# Patient Record
Sex: Female | Born: 1965
Health system: Southern US, Community
[De-identification: ages and names within clinical notes are randomized; demographics above are authoritative.]

## PROBLEM LIST (undated history)

## (undated) DIAGNOSIS — M5126 Other intervertebral disc displacement, lumbar region: Secondary | ICD-10-CM

## (undated) DIAGNOSIS — Z9109 Other allergy status, other than to drugs and biological substances: Secondary | ICD-10-CM

## (undated) DIAGNOSIS — T783XXA Angioneurotic edema, initial encounter: Secondary | ICD-10-CM

## (undated) DIAGNOSIS — I82409 Acute embolism and thrombosis of unspecified deep veins of unspecified lower extremity: Secondary | ICD-10-CM

## (undated) DIAGNOSIS — M1990 Unspecified osteoarthritis, unspecified site: Secondary | ICD-10-CM

## (undated) DIAGNOSIS — K219 Gastro-esophageal reflux disease without esophagitis: Secondary | ICD-10-CM

## (undated) DIAGNOSIS — F32A Depression, unspecified: Secondary | ICD-10-CM

## (undated) DIAGNOSIS — R197 Diarrhea, unspecified: Secondary | ICD-10-CM

## (undated) DIAGNOSIS — D510 Vitamin B12 deficiency anemia due to intrinsic factor deficiency: Secondary | ICD-10-CM

## (undated) DIAGNOSIS — N189 Chronic kidney disease, unspecified: Secondary | ICD-10-CM

## (undated) DIAGNOSIS — I739 Peripheral vascular disease, unspecified: Secondary | ICD-10-CM

## (undated) DIAGNOSIS — R011 Cardiac murmur, unspecified: Secondary | ICD-10-CM

## (undated) DIAGNOSIS — J302 Other seasonal allergic rhinitis: Secondary | ICD-10-CM

## (undated) DIAGNOSIS — F1410 Cocaine abuse, uncomplicated: Secondary | ICD-10-CM

## (undated) DIAGNOSIS — F419 Anxiety disorder, unspecified: Secondary | ICD-10-CM

## (undated) DIAGNOSIS — J189 Pneumonia, unspecified organism: Secondary | ICD-10-CM

## (undated) DIAGNOSIS — W19XXXA Unspecified fall, initial encounter: Secondary | ICD-10-CM

## (undated) DIAGNOSIS — F0781 Postconcussional syndrome: Secondary | ICD-10-CM

## (undated) DIAGNOSIS — G8929 Other chronic pain: Secondary | ICD-10-CM

## (undated) DIAGNOSIS — F329 Major depressive disorder, single episode, unspecified: Secondary | ICD-10-CM

## (undated) DIAGNOSIS — M549 Dorsalgia, unspecified: Secondary | ICD-10-CM

## (undated) DIAGNOSIS — K449 Diaphragmatic hernia without obstruction or gangrene: Secondary | ICD-10-CM

## (undated) DIAGNOSIS — G2582 Stiff-man syndrome: Secondary | ICD-10-CM

## (undated) HISTORY — DX: Vitamin B12 deficiency anemia due to intrinsic factor deficiency: D51.0

## (undated) HISTORY — DX: Cocaine abuse, uncomplicated: F14.10

## (undated) HISTORY — DX: Diaphragmatic hernia without obstruction or gangrene: K44.9

## (undated) HISTORY — DX: Postconcussional syndrome: F07.81

## (undated) HISTORY — DX: Unspecified fall, initial encounter: W19.XXXA

## (undated) HISTORY — DX: Stiff-man syndrome: G25.82

## (undated) HISTORY — PX: BUNIONECTOMY: SHX129

---
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
## 1998-08-08 ENCOUNTER — Emergency Department (HOSPITAL_COMMUNITY): Admission: EM | Admit: 1998-08-08 | Discharge: 1998-08-08 | Payer: Self-pay | Admitting: Emergency Medicine

## 1998-08-21 ENCOUNTER — Emergency Department (HOSPITAL_COMMUNITY): Admission: EM | Admit: 1998-08-21 | Discharge: 1998-08-21 | Payer: Self-pay | Admitting: Emergency Medicine

## 1998-11-21 ENCOUNTER — Emergency Department (HOSPITAL_COMMUNITY): Admission: EM | Admit: 1998-11-21 | Discharge: 1998-11-21 | Payer: Self-pay | Admitting: Emergency Medicine

## 1999-06-07 ENCOUNTER — Other Ambulatory Visit: Admission: RE | Admit: 1999-06-07 | Discharge: 1999-06-07 | Payer: Self-pay | Admitting: Obstetrics

## 1999-07-09 ENCOUNTER — Emergency Department (HOSPITAL_COMMUNITY): Admission: EM | Admit: 1999-07-09 | Discharge: 1999-07-09 | Payer: Self-pay | Admitting: Emergency Medicine

## 1999-08-28 ENCOUNTER — Emergency Department (HOSPITAL_COMMUNITY): Admission: EM | Admit: 1999-08-28 | Discharge: 1999-08-28 | Payer: Self-pay | Admitting: Emergency Medicine

## 1999-09-06 ENCOUNTER — Other Ambulatory Visit: Admission: RE | Admit: 1999-09-06 | Discharge: 1999-09-06 | Payer: Self-pay | Admitting: Obstetrics

## 1999-11-02 ENCOUNTER — Encounter: Admission: RE | Admit: 1999-11-02 | Discharge: 1999-11-02 | Payer: Self-pay | Admitting: Internal Medicine

## 1999-11-02 ENCOUNTER — Encounter: Payer: Self-pay | Admitting: Internal Medicine

## 2000-06-19 ENCOUNTER — Other Ambulatory Visit: Admission: RE | Admit: 2000-06-19 | Discharge: 2000-06-19 | Payer: Self-pay | Admitting: Obstetrics

## 2000-08-24 ENCOUNTER — Emergency Department (HOSPITAL_COMMUNITY): Admission: EM | Admit: 2000-08-24 | Discharge: 2000-08-24 | Payer: Self-pay | Admitting: Emergency Medicine

## 2000-09-03 ENCOUNTER — Other Ambulatory Visit: Admission: RE | Admit: 2000-09-03 | Discharge: 2000-09-03 | Payer: Self-pay | Admitting: Obstetrics

## 2001-03-13 ENCOUNTER — Ambulatory Visit (HOSPITAL_COMMUNITY): Admission: RE | Admit: 2001-03-13 | Discharge: 2001-03-13 | Payer: Self-pay | Admitting: Neurology

## 2001-04-12 ENCOUNTER — Encounter: Payer: Self-pay | Admitting: Neurology

## 2001-04-12 ENCOUNTER — Encounter: Admission: RE | Admit: 2001-04-12 | Discharge: 2001-04-12 | Payer: Self-pay | Admitting: Neurology

## 2003-05-23 ENCOUNTER — Emergency Department (HOSPITAL_COMMUNITY): Admission: EM | Admit: 2003-05-23 | Discharge: 2003-05-24 | Payer: Self-pay

## 2003-06-22 ENCOUNTER — Ambulatory Visit (HOSPITAL_COMMUNITY): Admission: RE | Admit: 2003-06-22 | Discharge: 2003-06-22 | Payer: Self-pay | Admitting: Gastroenterology

## 2003-12-10 ENCOUNTER — Emergency Department (HOSPITAL_COMMUNITY): Admission: EM | Admit: 2003-12-10 | Discharge: 2003-12-10 | Payer: Self-pay | Admitting: Emergency Medicine

## 2004-01-03 ENCOUNTER — Emergency Department (HOSPITAL_COMMUNITY): Admission: EM | Admit: 2004-01-03 | Discharge: 2004-01-03 | Payer: Self-pay | Admitting: Emergency Medicine

## 2004-10-31 ENCOUNTER — Ambulatory Visit: Payer: Self-pay | Admitting: Hematology & Oncology

## 2004-12-27 ENCOUNTER — Ambulatory Visit: Payer: Self-pay | Admitting: Hematology & Oncology

## 2005-02-21 ENCOUNTER — Ambulatory Visit: Payer: Self-pay | Admitting: Hematology & Oncology

## 2005-04-25 ENCOUNTER — Ambulatory Visit: Payer: Self-pay | Admitting: Hematology & Oncology

## 2005-06-26 ENCOUNTER — Ambulatory Visit: Payer: Self-pay | Admitting: Hematology & Oncology

## 2005-08-18 ENCOUNTER — Ambulatory Visit: Payer: Self-pay | Admitting: Hematology & Oncology

## 2005-09-18 ENCOUNTER — Encounter: Admission: RE | Admit: 2005-09-18 | Discharge: 2005-09-18 | Payer: Self-pay | Admitting: Obstetrics

## 2005-10-13 ENCOUNTER — Ambulatory Visit: Payer: Self-pay | Admitting: Hematology & Oncology

## 2005-12-15 ENCOUNTER — Ambulatory Visit: Payer: Self-pay | Admitting: Hematology & Oncology

## 2006-02-09 ENCOUNTER — Ambulatory Visit: Payer: Self-pay | Admitting: Hematology & Oncology

## 2006-03-22 ENCOUNTER — Emergency Department (HOSPITAL_COMMUNITY): Admission: EM | Admit: 2006-03-22 | Discharge: 2006-03-22 | Payer: Self-pay | Admitting: Emergency Medicine

## 2006-04-06 ENCOUNTER — Ambulatory Visit: Payer: Self-pay | Admitting: Hematology & Oncology

## 2006-04-09 LAB — CBC WITH DIFFERENTIAL/PLATELET
BASO%: 0.2 % (ref 0.0–2.0)
Basophils Absolute: 0 10*3/uL (ref 0.0–0.1)
Eosinophils Absolute: 0 10*3/uL (ref 0.0–0.5)
HCT: 40.5 % (ref 34.8–46.6)
HGB: 13.3 g/dL (ref 11.6–15.9)
MCHC: 32.8 g/dL (ref 32.0–36.0)
MCV: 84.2 fL (ref 81.0–101.0)
MONO#: 0.4 10*3/uL (ref 0.1–0.9)
NEUT#: 6 10*3/uL (ref 1.5–6.5)
NEUT%: 69.1 % (ref 39.6–76.8)
WBC: 8.6 10*3/uL (ref 3.9–10.0)
lymph#: 2.2 10*3/uL (ref 0.9–3.3)

## 2006-04-13 ENCOUNTER — Encounter: Admission: RE | Admit: 2006-04-13 | Discharge: 2006-04-13 | Payer: Self-pay | Admitting: Neurology

## 2006-05-07 LAB — CBC WITH DIFFERENTIAL/PLATELET
BASO%: 0.1 % (ref 0.0–2.0)
HCT: 38 % (ref 34.8–46.6)
MCHC: 33.3 g/dL (ref 32.0–36.0)
MCV: 84.1 fL (ref 81.0–101.0)
MONO#: 0.4 10*3/uL (ref 0.1–0.9)
RBC: 4.52 10*6/uL (ref 3.70–5.32)
RDW: 13.5 % (ref 11.3–14.5)
WBC: 6.7 10*3/uL (ref 3.9–10.0)
lymph#: 2.2 10*3/uL (ref 0.9–3.3)

## 2006-05-31 ENCOUNTER — Ambulatory Visit: Payer: Self-pay | Admitting: Hematology & Oncology

## 2006-06-04 LAB — CBC WITH DIFFERENTIAL/PLATELET
BASO%: 0.1 % (ref 0.0–2.0)
Basophils Absolute: 0 10*3/uL (ref 0.0–0.1)
HCT: 38.6 % (ref 34.8–46.6)
LYMPH%: 32.1 % (ref 14.0–48.0)
MCHC: 32.8 g/dL (ref 32.0–36.0)
MONO#: 0.4 10*3/uL (ref 0.1–0.9)
NEUT#: 4 10*3/uL (ref 1.5–6.5)
NEUT%: 62.2 % (ref 39.6–76.8)
Platelets: 315 10*3/uL (ref 145–400)
RBC: 4.59 10*6/uL (ref 3.70–5.32)
WBC: 6.5 10*3/uL (ref 3.9–10.0)

## 2006-07-16 ENCOUNTER — Ambulatory Visit: Payer: Self-pay | Admitting: Hematology & Oncology

## 2006-07-16 LAB — CBC WITH DIFFERENTIAL/PLATELET
Eosinophils Absolute: 0 10*3/uL (ref 0.0–0.5)
HCT: 38.2 % (ref 34.8–46.6)
LYMPH%: 26.2 % (ref 14.0–48.0)
MONO#: 0.4 10*3/uL (ref 0.1–0.9)
NEUT#: 5.5 10*3/uL (ref 1.5–6.5)
Platelets: 292 10*3/uL (ref 145–400)
RBC: 4.55 10*6/uL (ref 3.70–5.32)
WBC: 8.1 10*3/uL (ref 3.9–10.0)

## 2006-07-16 LAB — VITAMIN B12: Vitamin B-12: 573 pg/mL (ref 211–911)

## 2006-08-16 ENCOUNTER — Emergency Department (HOSPITAL_COMMUNITY): Admission: EM | Admit: 2006-08-16 | Discharge: 2006-08-17 | Payer: Self-pay | Admitting: Emergency Medicine

## 2006-08-20 LAB — CBC & DIFF AND RETIC
Basophils Absolute: 0 10*3/uL (ref 0.0–0.1)
Eosinophils Absolute: 0 10*3/uL (ref 0.0–0.5)
HCT: 39.3 % (ref 34.8–46.6)
HGB: 13.1 g/dL (ref 11.6–15.9)
IRF: 0.22 (ref 0.130–0.330)
LYMPH%: 22.8 % (ref 14.0–48.0)
MCV: 84.2 fL (ref 81.0–101.0)
MONO#: 0.4 10*3/uL (ref 0.1–0.9)
MONO%: 4.8 % (ref 0.0–13.0)
NEUT#: 6.1 10*3/uL (ref 1.5–6.5)
NEUT%: 72.3 % (ref 39.6–76.8)
Platelets: 342 10*3/uL (ref 145–400)
RBC: 4.67 10*6/uL (ref 3.70–5.32)
RETIC #: 87.8 10*3/uL (ref 19.7–115.1)
Retic %: 1.9 % (ref 0.4–2.3)
WBC: 8.5 10*3/uL (ref 3.9–10.0)
lymph#: 1.9 10*3/uL (ref 0.9–3.3)

## 2006-08-20 LAB — CHCC SMEAR

## 2006-09-13 ENCOUNTER — Ambulatory Visit: Payer: Self-pay | Admitting: Hematology & Oncology

## 2006-09-20 ENCOUNTER — Encounter: Admission: RE | Admit: 2006-09-20 | Discharge: 2006-09-20 | Payer: Self-pay | Admitting: Obstetrics

## 2006-10-31 ENCOUNTER — Emergency Department (HOSPITAL_COMMUNITY): Admission: EM | Admit: 2006-10-31 | Discharge: 2006-10-31 | Payer: Self-pay | Admitting: Emergency Medicine

## 2006-11-01 ENCOUNTER — Encounter: Admission: RE | Admit: 2006-11-01 | Discharge: 2006-11-01 | Payer: Self-pay | Admitting: Orthopedic Surgery

## 2006-11-14 ENCOUNTER — Ambulatory Visit: Payer: Self-pay | Admitting: Hematology & Oncology

## 2006-11-22 ENCOUNTER — Encounter: Admission: RE | Admit: 2006-11-22 | Discharge: 2006-11-22 | Payer: Self-pay | Admitting: Orthopedic Surgery

## 2006-12-14 ENCOUNTER — Encounter (INDEPENDENT_AMBULATORY_CARE_PROVIDER_SITE_OTHER): Payer: Self-pay | Admitting: *Deleted

## 2006-12-14 ENCOUNTER — Ambulatory Visit (HOSPITAL_COMMUNITY): Admission: RE | Admit: 2006-12-14 | Discharge: 2006-12-14 | Payer: Self-pay | Admitting: Gastroenterology

## 2007-01-09 ENCOUNTER — Ambulatory Visit: Payer: Self-pay | Admitting: Hematology & Oncology

## 2007-03-06 ENCOUNTER — Ambulatory Visit: Payer: Self-pay | Admitting: Hematology & Oncology

## 2007-03-11 LAB — CBC WITH DIFFERENTIAL/PLATELET
BASO%: 0.3 % (ref 0.0–2.0)
Basophils Absolute: 0 10*3/uL (ref 0.0–0.1)
EOS%: 0 % (ref 0.0–7.0)
Eosinophils Absolute: 0 10*3/uL (ref 0.0–0.5)
HCT: 38.2 % (ref 34.8–46.6)
HGB: 12.7 g/dL (ref 11.6–15.9)
LYMPH%: 32.7 % (ref 14.0–48.0)
MCH: 28 pg (ref 26.0–34.0)
MCHC: 33.2 g/dL (ref 32.0–36.0)
MCV: 84.3 fL (ref 81.0–101.0)
MONO%: 5.5 % (ref 0.0–13.0)
NEUT#: 3.6 10*3/uL (ref 1.5–6.5)
NEUT%: 61.5 % (ref 39.6–76.8)
Platelets: 347 10*3/uL (ref 145–400)
lymph#: 1.9 10*3/uL (ref 0.9–3.3)

## 2007-05-02 ENCOUNTER — Ambulatory Visit: Payer: Self-pay | Admitting: Hematology & Oncology

## 2007-05-12 ENCOUNTER — Emergency Department (HOSPITAL_COMMUNITY): Admission: EM | Admit: 2007-05-12 | Discharge: 2007-05-12 | Payer: Self-pay | Admitting: Emergency Medicine

## 2007-07-11 ENCOUNTER — Ambulatory Visit: Payer: Self-pay | Admitting: Hematology & Oncology

## 2007-08-02 ENCOUNTER — Encounter: Admission: RE | Admit: 2007-08-02 | Discharge: 2007-08-02 | Payer: Self-pay | Admitting: Orthopedic Surgery

## 2007-08-20 ENCOUNTER — Encounter: Admission: RE | Admit: 2007-08-20 | Discharge: 2007-08-20 | Payer: Self-pay | Admitting: Orthopedic Surgery

## 2007-08-30 ENCOUNTER — Emergency Department (HOSPITAL_COMMUNITY): Admission: EM | Admit: 2007-08-30 | Discharge: 2007-08-30 | Payer: Self-pay | Admitting: Emergency Medicine

## 2007-09-05 ENCOUNTER — Ambulatory Visit: Payer: Self-pay | Admitting: Hematology & Oncology

## 2007-09-13 ENCOUNTER — Encounter: Admission: RE | Admit: 2007-09-13 | Discharge: 2007-09-13 | Payer: Self-pay | Admitting: Orthopedic Surgery

## 2007-10-31 ENCOUNTER — Ambulatory Visit: Payer: Self-pay | Admitting: Hematology & Oncology

## 2007-12-25 ENCOUNTER — Ambulatory Visit: Payer: Self-pay | Admitting: Hematology & Oncology

## 2008-02-20 ENCOUNTER — Ambulatory Visit: Payer: Self-pay | Admitting: Hematology & Oncology

## 2008-04-30 ENCOUNTER — Ambulatory Visit: Payer: Self-pay | Admitting: Hematology & Oncology

## 2008-05-04 LAB — CBC WITH DIFFERENTIAL/PLATELET
Basophils Absolute: 0 10*3/uL (ref 0.0–0.1)
EOS%: 0 % (ref 0.0–7.0)
Eosinophils Absolute: 0 10*3/uL (ref 0.0–0.5)
HCT: 39.8 % (ref 34.8–46.6)
HGB: 13.3 g/dL (ref 11.6–15.9)
NEUT#: 5.7 10*3/uL (ref 1.5–6.5)
RDW: 13.4 % (ref 11.3–14.5)
WBC: 8.5 10*3/uL (ref 3.9–10.0)
lymph#: 2.4 10*3/uL (ref 0.9–3.3)

## 2008-07-01 ENCOUNTER — Ambulatory Visit: Payer: Self-pay | Admitting: Hematology & Oncology

## 2008-08-11 ENCOUNTER — Encounter: Payer: Self-pay | Admitting: Internal Medicine

## 2008-08-27 ENCOUNTER — Ambulatory Visit: Payer: Self-pay | Admitting: Hematology & Oncology

## 2008-10-22 ENCOUNTER — Ambulatory Visit: Payer: Self-pay | Admitting: Hematology & Oncology

## 2008-12-15 ENCOUNTER — Ambulatory Visit: Payer: Self-pay | Admitting: Hematology & Oncology

## 2009-02-11 ENCOUNTER — Ambulatory Visit: Payer: Self-pay | Admitting: Hematology & Oncology

## 2009-04-09 ENCOUNTER — Ambulatory Visit: Payer: Self-pay | Admitting: Hematology & Oncology

## 2009-04-12 ENCOUNTER — Ambulatory Visit (HOSPITAL_BASED_OUTPATIENT_CLINIC_OR_DEPARTMENT_OTHER): Admission: RE | Admit: 2009-04-12 | Discharge: 2009-04-12 | Payer: Self-pay | Admitting: Hematology & Oncology

## 2009-04-12 ENCOUNTER — Ambulatory Visit: Payer: Self-pay | Admitting: Diagnostic Radiology

## 2009-04-12 LAB — CBC WITH DIFFERENTIAL (CANCER CENTER ONLY)
BASO#: 0 10*3/uL (ref 0.0–0.2)
BASO%: 0.4 % (ref 0.0–2.0)
EOS%: 0.6 % (ref 0.0–7.0)
Eosinophils Absolute: 0.1 10*3/uL (ref 0.0–0.5)
HCT: 38.7 % (ref 34.8–46.6)
HGB: 12.8 g/dL (ref 11.6–15.9)
LYMPH#: 2.3 10*3/uL (ref 0.9–3.3)
LYMPH%: 25.3 % (ref 14.0–48.0)
MCH: 27.5 pg (ref 26.0–34.0)
MCHC: 33.2 g/dL (ref 32.0–36.0)
MCV: 83 fL (ref 81–101)
MONO#: 0.5 10*3/uL (ref 0.1–0.9)
MONO%: 5.9 % (ref 0.0–13.0)
NEUT#: 6.1 10*3/uL (ref 1.5–6.5)
NEUT%: 67.8 % (ref 39.6–80.0)
Platelets: 313 10*3/uL (ref 145–400)
RBC: 4.66 10*6/uL (ref 3.70–5.32)
RDW: 12.5 % (ref 10.5–14.6)
WBC: 9 10*3/uL (ref 3.9–10.0)

## 2009-04-12 LAB — RETICULOCYTES (CHCC)
ABS Retic: 81.9 10*3/uL (ref 19.0–186.0)
RBC.: 4.55 MIL/uL (ref 3.87–5.11)
Retic Ct Pct: 1.8 % (ref 0.4–3.1)

## 2009-05-10 ENCOUNTER — Ambulatory Visit: Payer: Self-pay | Admitting: Hematology

## 2009-05-14 ENCOUNTER — Emergency Department (HOSPITAL_COMMUNITY): Admission: EM | Admit: 2009-05-14 | Discharge: 2009-05-15 | Payer: Self-pay | Admitting: Emergency Medicine

## 2009-05-17 ENCOUNTER — Encounter: Admission: RE | Admit: 2009-05-17 | Discharge: 2009-05-17 | Payer: Self-pay | Admitting: Allergy

## 2009-05-17 ENCOUNTER — Encounter: Payer: Self-pay | Admitting: Internal Medicine

## 2009-07-01 ENCOUNTER — Ambulatory Visit: Payer: Self-pay | Admitting: Hematology

## 2009-07-22 ENCOUNTER — Ambulatory Visit: Payer: Self-pay | Admitting: *Deleted

## 2009-07-22 ENCOUNTER — Emergency Department (HOSPITAL_COMMUNITY): Admission: EM | Admit: 2009-07-22 | Discharge: 2009-07-22 | Payer: Self-pay | Admitting: Emergency Medicine

## 2009-07-23 ENCOUNTER — Ambulatory Visit (HOSPITAL_COMMUNITY): Admission: RE | Admit: 2009-07-23 | Discharge: 2009-07-23 | Payer: Self-pay | Admitting: Emergency Medicine

## 2009-07-23 ENCOUNTER — Encounter (INDEPENDENT_AMBULATORY_CARE_PROVIDER_SITE_OTHER): Payer: Self-pay | Admitting: Emergency Medicine

## 2009-08-26 ENCOUNTER — Ambulatory Visit: Payer: Self-pay | Admitting: Hematology

## 2009-09-22 ENCOUNTER — Emergency Department (HOSPITAL_COMMUNITY): Admission: EM | Admit: 2009-09-22 | Discharge: 2009-09-22 | Payer: Self-pay | Admitting: Emergency Medicine

## 2009-09-23 ENCOUNTER — Ambulatory Visit: Payer: Self-pay | Admitting: Hematology

## 2009-10-13 ENCOUNTER — Emergency Department (HOSPITAL_COMMUNITY): Admission: EM | Admit: 2009-10-13 | Discharge: 2009-10-13 | Payer: Self-pay | Admitting: Emergency Medicine

## 2009-10-25 ENCOUNTER — Ambulatory Visit: Payer: Self-pay | Admitting: Hematology

## 2009-12-15 ENCOUNTER — Ambulatory Visit: Payer: Self-pay | Admitting: Hematology

## 2010-01-07 ENCOUNTER — Emergency Department (HOSPITAL_COMMUNITY): Admission: EM | Admit: 2010-01-07 | Discharge: 2010-01-07 | Payer: Self-pay | Admitting: Emergency Medicine

## 2010-01-09 ENCOUNTER — Emergency Department (HOSPITAL_COMMUNITY): Admission: EM | Admit: 2010-01-09 | Discharge: 2010-01-09 | Payer: Self-pay | Admitting: Emergency Medicine

## 2010-01-13 ENCOUNTER — Ambulatory Visit: Payer: Self-pay | Admitting: Hematology

## 2010-02-16 ENCOUNTER — Ambulatory Visit: Payer: Self-pay | Admitting: Hematology & Oncology

## 2010-02-17 LAB — CBC WITH DIFFERENTIAL (CANCER CENTER ONLY)
BASO%: 0.3 % (ref 0.0–2.0)
Eosinophils Absolute: 0 10*3/uL (ref 0.0–0.5)
HGB: 13 g/dL (ref 11.6–15.9)
LYMPH#: 2 10*3/uL (ref 0.9–3.3)
LYMPH%: 41.3 % (ref 14.0–48.0)
MCHC: 32.7 g/dL (ref 32.0–36.0)
MCV: 85 fL (ref 81–101)
MONO#: 0.3 10*3/uL (ref 0.1–0.9)
MONO%: 6.4 % (ref 0.0–13.0)
NEUT#: 2.5 10*3/uL (ref 1.5–6.5)
NEUT%: 51.5 % (ref 39.6–80.0)
Platelets: 381 10*3/uL (ref 145–400)
WBC: 4.9 10*3/uL (ref 3.9–10.0)

## 2010-02-17 LAB — CHCC SATELLITE - SMEAR

## 2010-02-18 LAB — COMPREHENSIVE METABOLIC PANEL
ALT: 21 U/L (ref 0–35)
AST: 27 U/L (ref 0–37)
Albumin: 4.7 g/dL (ref 3.5–5.2)
BUN: 13 mg/dL (ref 6–23)
CO2: 25 mEq/L (ref 19–32)
Calcium: 9.9 mg/dL (ref 8.4–10.5)
Chloride: 102 mEq/L (ref 96–112)
Total Bilirubin: 0.3 mg/dL (ref 0.3–1.2)

## 2010-02-18 LAB — RETICULOCYTES (CHCC)
ABS Retic: 41 10*3/uL (ref 19.0–186.0)
RBC.: 4.56 MIL/uL (ref 3.87–5.11)
Retic Ct Pct: 0.9 % (ref 0.4–3.1)

## 2010-02-18 LAB — VITAMIN D 25 HYDROXY (VIT D DEFICIENCY, FRACTURES): Vit D, 25-Hydroxy: 47 ng/mL (ref 30–89)

## 2010-02-22 ENCOUNTER — Ambulatory Visit (HOSPITAL_COMMUNITY): Admission: RE | Admit: 2010-02-22 | Discharge: 2010-02-22 | Payer: Self-pay | Admitting: Hematology & Oncology

## 2010-02-22 ENCOUNTER — Ambulatory Visit: Payer: Self-pay | Admitting: Surgery

## 2010-02-23 ENCOUNTER — Encounter: Payer: Self-pay | Admitting: Internal Medicine

## 2010-03-01 ENCOUNTER — Emergency Department (HOSPITAL_COMMUNITY): Admission: EM | Admit: 2010-03-01 | Discharge: 2010-03-01 | Payer: Self-pay | Admitting: Emergency Medicine

## 2010-03-01 ENCOUNTER — Emergency Department (HOSPITAL_COMMUNITY): Admission: EM | Admit: 2010-03-01 | Discharge: 2010-03-02 | Payer: Self-pay | Admitting: Emergency Medicine

## 2010-03-17 LAB — CBC WITH DIFFERENTIAL (CANCER CENTER ONLY)
BASO#: 0 10*3/uL (ref 0.0–0.2)
Eosinophils Absolute: 0.1 10*3/uL (ref 0.0–0.5)
HGB: 12.3 g/dL (ref 11.6–15.9)
LYMPH#: 2.1 10*3/uL (ref 0.9–3.3)
LYMPH%: 21.6 % (ref 14.0–48.0)
MCHC: 32.6 g/dL (ref 32.0–36.0)
MONO%: 4.5 % (ref 0.0–13.0)
NEUT#: 7.1 10*3/uL — ABNORMAL HIGH (ref 1.5–6.5)
NEUT%: 72.9 % (ref 39.6–80.0)
Platelets: 394 10*3/uL (ref 145–400)
RBC: 4.37 10*6/uL (ref 3.70–5.32)

## 2010-03-17 LAB — PROTIME-INR (CHCC SATELLITE)
INR: 1 — ABNORMAL LOW (ref 2.0–3.5)
Protime: 12 Seconds (ref 10.6–13.4)

## 2010-03-18 LAB — FOLATE RBC: RBC Folate: 273 ng/mL (ref 180–600)

## 2010-04-11 ENCOUNTER — Ambulatory Visit: Payer: Self-pay | Admitting: Oncology

## 2010-05-01 ENCOUNTER — Encounter: Admission: RE | Admit: 2010-05-01 | Discharge: 2010-05-01 | Payer: Self-pay | Admitting: Orthopedic Surgery

## 2010-05-10 ENCOUNTER — Ambulatory Visit: Payer: Self-pay | Admitting: Hematology & Oncology

## 2010-05-12 LAB — CBC WITH DIFFERENTIAL (CANCER CENTER ONLY)
BASO%: 0.5 % (ref 0.0–2.0)
EOS%: 0.7 % (ref 0.0–7.0)
HCT: 40.1 % (ref 34.8–46.6)
HGB: 13.2 g/dL (ref 11.6–15.9)
LYMPH#: 2.5 10*3/uL (ref 0.9–3.3)
MCHC: 32.9 g/dL (ref 32.0–36.0)
MCV: 85 fL (ref 81–101)
MONO#: 0.3 10*3/uL (ref 0.1–0.9)
MONO%: 4.2 % (ref 0.0–13.0)
Platelets: 335 10*3/uL (ref 145–400)
RBC: 4.71 10*6/uL (ref 3.70–5.32)
WBC: 7.7 10*3/uL (ref 3.9–10.0)

## 2010-05-12 LAB — COMPREHENSIVE METABOLIC PANEL
AST: 21 U/L (ref 0–37)
Alkaline Phosphatase: 93 U/L (ref 39–117)
BUN: 11 mg/dL (ref 6–23)
Calcium: 9.5 mg/dL (ref 8.4–10.5)
Chloride: 103 mEq/L (ref 96–112)
Potassium: 3.7 mEq/L (ref 3.5–5.3)
Sodium: 139 mEq/L (ref 135–145)
Total Protein: 7.5 g/dL (ref 6.0–8.3)

## 2010-05-12 LAB — CHCC SATELLITE - SMEAR

## 2010-05-30 ENCOUNTER — Encounter: Payer: Self-pay | Admitting: Internal Medicine

## 2010-06-13 ENCOUNTER — Ambulatory Visit: Payer: Self-pay | Admitting: Oncology

## 2010-07-28 ENCOUNTER — Ambulatory Visit: Payer: Self-pay | Admitting: Internal Medicine

## 2010-07-28 DIAGNOSIS — I80299 Phlebitis and thrombophlebitis of other deep vessels of unspecified lower extremity: Secondary | ICD-10-CM | POA: Insufficient documentation

## 2010-07-28 DIAGNOSIS — L500 Allergic urticaria: Secondary | ICD-10-CM | POA: Insufficient documentation

## 2010-07-28 DIAGNOSIS — J45909 Unspecified asthma, uncomplicated: Secondary | ICD-10-CM | POA: Insufficient documentation

## 2010-07-31 LAB — CONVERTED CEMR LAB
Basophils Absolute: 0 10*3/uL (ref 0.0–0.1)
Basophils Relative: 0.2 % (ref 0.0–3.0)
Eosinophils Absolute: 0 10*3/uL (ref 0.0–0.7)
Eosinophils Relative: 0 % (ref 0.0–5.0)
IgE (Immunoglobulin E), Serum: 6 intl units/mL (ref 0.0–180.0)
Lymphs Abs: 1.6 10*3/uL (ref 0.7–4.0)
MCHC: 32.8 g/dL (ref 30.0–36.0)
Monocytes Absolute: 0.5 10*3/uL (ref 0.1–1.0)
Neutro Abs: 6.4 10*3/uL (ref 1.4–7.7)
Platelets: 329 10*3/uL (ref 150.0–400.0)
RBC: 3.98 M/uL (ref 3.87–5.11)
RDW: 14.7 % — ABNORMAL HIGH (ref 11.5–14.6)
Sed Rate: 43 mm/hr — ABNORMAL HIGH (ref 0–22)
WBC: 8.5 10*3/uL (ref 4.5–10.5)

## 2010-08-03 ENCOUNTER — Ambulatory Visit: Payer: Self-pay | Admitting: Hematology & Oncology

## 2010-08-04 ENCOUNTER — Encounter: Payer: Self-pay | Admitting: Internal Medicine

## 2010-08-04 LAB — CBC WITH DIFFERENTIAL (CANCER CENTER ONLY)
BASO#: 0 10*3/uL (ref 0.0–0.2)
BASO%: 0.5 % (ref 0.0–2.0)
EOS%: 0.5 % (ref 0.0–7.0)
HGB: 11.3 g/dL — ABNORMAL LOW (ref 11.6–15.9)
LYMPH%: 25.8 % (ref 14.0–48.0)
MCH: 28.1 pg (ref 26.0–34.0)
MCHC: 32.8 g/dL (ref 32.0–36.0)
NEUT%: 68.3 % (ref 39.6–80.0)
RBC: 4.01 10*6/uL (ref 3.70–5.32)
RDW: 12.3 % (ref 10.5–14.6)
WBC: 7.7 10*3/uL (ref 3.9–10.0)

## 2010-08-04 LAB — RETICULOCYTES (CHCC)
ABS Retic: 60.3 10*3/uL (ref 19.0–186.0)
RBC.: 4.02 MIL/uL (ref 3.87–5.11)
Retic Ct Pct: 1.5 % (ref 0.4–3.1)

## 2010-08-04 LAB — FERRITIN: Ferritin: 16 ng/mL (ref 10–291)

## 2010-08-10 ENCOUNTER — Telehealth (INDEPENDENT_AMBULATORY_CARE_PROVIDER_SITE_OTHER): Payer: Self-pay | Admitting: *Deleted

## 2010-08-18 ENCOUNTER — Ambulatory Visit: Payer: Self-pay | Admitting: Internal Medicine

## 2010-08-18 DIAGNOSIS — IMO0002 Reserved for concepts with insufficient information to code with codable children: Secondary | ICD-10-CM | POA: Insufficient documentation

## 2010-08-23 ENCOUNTER — Telehealth (INDEPENDENT_AMBULATORY_CARE_PROVIDER_SITE_OTHER): Payer: Self-pay | Admitting: *Deleted

## 2010-09-01 ENCOUNTER — Ambulatory Visit: Payer: Self-pay | Admitting: Oncology

## 2010-09-20 ENCOUNTER — Telehealth: Payer: Self-pay | Admitting: Internal Medicine

## 2010-09-27 ENCOUNTER — Ambulatory Visit: Payer: Self-pay | Admitting: Hematology & Oncology

## 2010-09-29 ENCOUNTER — Encounter: Payer: Self-pay | Admitting: Internal Medicine

## 2010-09-29 LAB — CBC WITH DIFFERENTIAL (CANCER CENTER ONLY)
BASO#: 0 10*3/uL (ref 0.0–0.2)
EOS%: 0.5 % (ref 0.0–7.0)
Eosinophils Absolute: 0 10*3/uL (ref 0.0–0.5)
MCHC: 32.7 g/dL (ref 32.0–36.0)
MONO#: 0.4 10*3/uL (ref 0.1–0.9)
MONO%: 6.4 % (ref 0.0–13.0)
NEUT#: 4.1 10*3/uL (ref 1.5–6.5)
NEUT%: 63.1 % (ref 39.6–80.0)
RDW: 12.2 % (ref 10.5–14.6)

## 2010-09-29 LAB — RETICULOCYTES (CHCC)
RBC.: 4.34 MIL/uL (ref 3.87–5.11)
Retic Ct Pct: 1.6 % (ref 0.4–3.1)

## 2010-09-29 LAB — CHCC SATELLITE - SMEAR

## 2010-10-23 ENCOUNTER — Encounter: Admission: RE | Admit: 2010-10-23 | Discharge: 2010-10-23 | Payer: Self-pay | Admitting: Orthopaedic Surgery

## 2010-10-27 ENCOUNTER — Ambulatory Visit: Payer: Self-pay | Admitting: Hematology & Oncology

## 2010-12-09 ENCOUNTER — Ambulatory Visit: Payer: Self-pay | Admitting: Internal Medicine

## 2010-12-13 ENCOUNTER — Telehealth (INDEPENDENT_AMBULATORY_CARE_PROVIDER_SITE_OTHER): Payer: Self-pay | Admitting: *Deleted

## 2010-12-20 ENCOUNTER — Ambulatory Visit: Payer: Self-pay | Admitting: Oncology

## 2010-12-23 ENCOUNTER — Ambulatory Visit: Payer: Self-pay | Admitting: Hematology & Oncology

## 2011-01-19 ENCOUNTER — Encounter: Payer: Self-pay | Admitting: Internal Medicine

## 2011-01-19 LAB — FERRITIN: Ferritin: 230 ng/mL (ref 10–291)

## 2011-01-19 LAB — CBC WITH DIFFERENTIAL (CANCER CENTER ONLY)
BASO#: 0 10*3/uL (ref 0.0–0.2)
BASO%: 0.4 % (ref 0.0–2.0)
EOS%: 0.7 % (ref 0.0–7.0)
Eosinophils Absolute: 0 10*3/uL (ref 0.0–0.5)
LYMPH#: 2.4 10*3/uL (ref 0.9–3.3)
LYMPH%: 38 % (ref 14.0–48.0)
MCV: 86 fL (ref 81–101)
NEUT#: 3.6 10*3/uL (ref 1.5–6.5)
NEUT%: 56.1 % (ref 39.6–80.0)
Platelets: 306 10*3/uL (ref 145–400)

## 2011-01-24 NOTE — Letter (Signed)
[facility]   Imported By: Sherian Rein 08/30/2010 14:20:50  _____________________________________________________________________  External Attachment:    Type:   Image     Comment:   External Document

## 2011-01-24 NOTE — Assessment & Plan Note (Signed)
Summary: 3 week return/mhh   Primary Provider/Referring Provider:  Dorothyann Peng, MD  CC:  3 week follow up visit-allergies-no complaints; Recent Iron transfusion-low levels..  History of Present Illness: August 18, 2010-  Work related urticaria Dr Myna Hidalgo giving iron infusion for anemia. She remains out of work since May 27, 2010 now for her back problems and for the urticaria.. Brings forms for me to update. Feels well today with no rash. Forgot to bring me a picture of her when swollen. Voc Rehab can't work with her - some issue left over from her back problems- She says what she wants is to work in a "safe environment" or to get disability if she can't work.   Asthma History    Asthma Control Assessment:    Age range: 12+ years    Symptoms: 0-2 days/week    Nighttime Awakenings: 0-2/month    Interferes w/ normal activity: no limitations    SABA use (not for EIB): 0-2 days/week    Asthma Control Assessment: Well Controlled   Preventive Screening-Counseling & Management  Alcohol-Tobacco     Smoking Status: never  Current Medications (verified): 1)  Xyzal 5 Mg Tabs (Levocetirizine Dihydrochloride) .... Take 1 By Mouth Once Daily 2)  Allegra 180 Mg Tabs (Fexofenadine Hcl) .... Take 1 By Mouth Once Daily 3)  Advair Diskus 100-50 Mcg/dose Aepb (Fluticasone-Salmeterol) .[name].. 1 Puff Two Times A Day and Rinse Mouth Well 4)  Epipen 0.3 Mg/0.26ml Devi (Epinephrine) .... Use As Directed As Needed Severe Allergic Reaction 5)  Proair Hfa 108 (90 Base) Mcg/act Aers (Albuterol Sulfate) .... 2 Puffs Four Times A Day As Needed 6)  Cyanocobalamin 1000 Mcg/ml Soln (Cyanocobalamin) .... Injection Once Monthly 7)  Lexapro 20 Mg Tabs (Escitalopram Oxalate) .... Take 1  By Mouth Once Daily 8)  Alprazolam 0.5 Mg Tabs (Alprazolam) .... Take 1 By Mouth Three Times A Day As Needed 9)  Gabapentin 300 Mg Caps (Gabapentin) .... Take 1 By Mouth Three Times A Day 10)  Aleve 220 Mg Tabs (Naproxen Sodium) ....  Take 2 By Mouth Once Daily 11)  Singulair 10 Mg Tabs (Montelukast Sodium) .... Take 1 By Mouth Once Daily 12)  Hydroxyzine Hcl 10 Mg Tabs (Hydroxyzine Hcl) .... Take 1 By Mouth Three Times A Day As Needed Cough  Allergies (verified): 1)  ! 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 Took coumadin.  Past Surgical History: Last updated: 07/28/2010 Left Inguinal Hernia repair (Dr. 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    Vital Signs:  Patient profile:   45 year old female Height:      63 inches Weight:      155.25 pounds BMI:     27.60 O2 Sat:      100 % on Room air Pulse rate:   80 / minute  BP sitting:   132 / 86  (left arm) Cuff size:   regular  Vitals Entered By: Reynaldo Minium CMA (August 18, 2010 12:07 PM)  O2 Flow:  Room air CC: 3 week follow up visit-allergies-no complaints; Recent Iron transfusion-low levels.   Physical Exam  Additional Exam:  General: A/Ox3; pleasant and cooperative, NAD, apparently healthy, relaxed and pleasant SKIN: no rash, lesions. No hives now. NODES: no  lymphadenopathy HEENT: /AT, EOM- WNL, Conjuctivae- clear, PERRLA, TM-WNL, Nose- clear, Throat- clear and wnl, voice is normal NECK: Supple w/ fair ROM, JVD- none, normal carotid impulses w/o bruits Thyroid-  CHEST: Clear to P&A HEART: RRR, no m/g/r heard ABDOMEN: medium build JXB:JYNW, nl pulses, no edema  NEURO: Grossly intact to observation      Impression & Recommendations:  Problem # 1:  URTICARIA (ICD-708.9)  We discussed possibility of her bringing samples from work of dust and freshly barcoded paper to apply as patch tests. If negative it won't mean much. If positive it would help some to identify the trigger for her hives. There still is unlikely to be a treatment other than avoidance of the exposure area. She says she isn't limited physically by hives, but does have disk disease at L4-S1. Weight loss helps. Her orthopedic doctors follw this and would decide any limitation of activity attributable to her back..She brings forms for me to complete.  Problem # 2:  ASTHMA (ICD-493.90) This has not been an active issue in the time I have seen her.  Medications Added to Medication List This Visit: 1)  Hydroxyzine Hcl 10 Mg Tabs (Hydroxyzine hcl) .... Take 1 by mouth three times a day as needed cough  Other Orders: Est. Patient Level IV (29562)  Patient Instructions: 1)  Please schedule a follow-up appointment in 4 months. 2)  See if you can bring fresh samples of dust and barcoded paper in zip lock bags that we could use to make patch tests to test for allergy.  3)  I will address the forms you brought.

## 2011-01-24 NOTE — Letter (Signed)
Summary: [facility]  [facility]   Imported By: Lester [place] 10/25/2010 08:41:04  _____________________________________________________________________  External Attachment:    Type:   Image     Comment:   External Document

## 2011-01-24 NOTE — Progress Notes (Signed)
Summary: worker's comp forms  Phone Note Call from Patient Call back at cell: [phone]   Caller: Patient Call For: young Summary of Call: patient calling to inquire on her worker's comp form that she left at last OV.  she states that she needs this within the next 20days please. a records request was sent to [employer] per EMR chart. Initial call taken by: Boone Master CNA/MA,  September 20, 2010 3:24 PM  Follow-up for Phone Call        Spoke to [employer] and they do not have any record of any forms for this patient. Dr. Maple Hudson do you know anything about these forms? It is mentioned in pt last OV note.  Please advise.  Carron Curie CMA  September 20, 2010 4:13 PM   Additional Follow-up for Phone Call Additional follow up Details #1::        Forms are completed. Additional Follow-up by: Waymon Budge MD,  October 04, 2010 8:43 AM

## 2011-01-24 NOTE — Letter (Signed)
Summary: Sidney Ace MD  Sidney Ace MD   Imported By: Lester [place] 08/04/2010 10:16:20  _____________________________________________________________________  External Attachment:    Type:   Image     Comment:   External Document

## 2011-01-24 NOTE — Assessment & Plan Note (Signed)
Summary: allergy consult/worker's comp/apc   Vital Signs:  Patient profile:   45 year old female Height:      63 inches Weight:      156.50 pounds BMI:     27.82 O2 Sat:      95 % on Room air Pulse rate:   95 / minute BP sitting:   134 / 86  (right arm) Cuff size:   regular  Vitals Entered By: Reynaldo Minium CMA (July 28, 2010 11:12 AM)  O2 Flow:  Room air CC: Allergy Consult-Workers Comp case-formerly seen Dr. [place] Callas.   Primary Provider/Referring Provider:  Dorothyann Peng, MD  CC:  Allergy Consult-Workers Comp case-formerly seen Dr. Tanaina Callas..  History of Present Illness: July 28, 2010- 43 yoF self-referred for concern of recurrent urticaria. She has previously seen Dr Willa Rough, Dr Lucie Leather and Dr Florence-Graham Callas, and Dr McDowell/ Allergist at [facility] for this problem. She denies problems until she went to work at a [employer] in 1997. 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 Hives were happening most days. She saw no improvement with move from an older building on [address] to a new site 10 years ago. She worked in an office, off the workroom floor, forfrom Feb 2009-August 2010. She had "less trouble" with this, until carpet was taken up, exposing underflooring, with no cleaning or new carpet placed. There was still some dust coming in from the work room. She remembers a single episode of hives and swelling lips after the carpet was taken up- the first in 4 years. She then went out on leave 8/20/ 2010 - 05/25/2010. During that time she was taking only Clarinex and had no hives.  Back to work May 26, 2010 she immediately began having hives again as she was assigned to several different machines. This first day back to work had the first hives in a year.Out of work again since May 26, 2010 per her primary, Dr Allyne Gee.  Never has hives at her home. Skin tested several times- positive molds and dust mite. Wearing mask helps some. Avoiding the work room floor area  helps some. Combination treatments have been tried with Singulair, Zyrtec/ Tagamet, various antihistamines. Only prednisone really controls this but she dislikes. Last steroid (injection) 05/30/10.. Asthma at times, onlyy if her lips are swelling. Has used Proair and Advair at work. Now taking Xyzal or Allegra. What she wants is a medical treatment that allows her to work so she can [place] accrued benefits at [employer], or an alternative job away from the work room floor area and associated exposures. She says other doctors have gotten tired of filling out workman's comp forms for her.  Asthma History    Initial Asthma Severity Rating:    Age range: 12+ years    Symptoms: 0-2 days/week    Nighttime Awakenings: 0-2/month    Interferes w/ normal activity: no limitations    SABA use (not for EIB): 0-2 days/week    Asthma Severity Assessment: Intermittent   Preventive Screening-Counseling & Management  Alcohol-Tobacco     Smoking Status: never  Current Medications (verified): 1)  Xyzal 5 Mg Tabs (Levocetirizine Dihydrochloride) .... Take 1 By Mouth Once Daily 2)  Allegra 180 Mg Tabs (Fexofenadine Hcl) .... Take 1 By Mouth Once Daily 3)  Advair Diskus 100-50 Mcg/dose Aepb (Fluticasone-Salmeterol) .[name].. 1 Puff Two Times A Day and Rinse Mouth Well 4)  Epipen 0.3 Mg/0.24ml Devi (Epinephrine) .... Use As Directed  As Needed Severe Allergic Reaction 5)  Proair Hfa 108 (90 Base) Mcg/act Aers (Albuterol Sulfate) .... 2 Puffs Four Times A Day As Needed 6)  Cyanocobalamin 1000 Mcg/ml Soln (Cyanocobalamin) .... Injection Once Monthly 7)  Lexapro 20 Mg Tabs (Escitalopram Oxalate) .... Take 1  By Mouth Once Daily 8)  Alprazolam 0.5 Mg Tabs (Alprazolam) .... Take 1 By Mouth Three Times A Day As Needed 9)  Gabapentin 300 Mg Caps (Gabapentin) .... Take 1 By Mouth Three Times A Day 10)  Aleve 220 Mg Tabs (Naproxen Sodium) .... Take 2 By Mouth Once Daily 11)  Singulair 10 Mg Tabs (Montelukast Sodium) ....  Take 1 By Mouth Once Daily  Allergies (verified): 1)  ! 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 Took coumadin.  Past Surgical History: Left Inguinal Hernia repair (Dr. 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  The patient denies shortness of breath with activity, shortness of breath at rest, non-productive cough, coughing up blood, chest pain, irregular heartbeats, acid heartburn, indigestion, loss of appetite, weight change, abdominal pain, difficulty swallowing, sore throat, tooth/dental problems, headaches, nasal congestion/difficulty breathing through nose, sneezing, itching, ear ache, hand/feet swelling, joint stiffness or pain, rash, change in color of mucus, and fever.    Physical Exam  Additional Exam:  General: A/Ox3; pleasant and cooperative, NAD, apparently healthy SKIN: no rash, lesions. No hives now. 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 We won't likely have a specific test for that sensitization. It sounds as if this was apparent very early in her employment at the [employer], but I can't tell if she brought that sensitivity with her, in which case she might have  reacted within the first few days .Most likely her best solution is to change job exposures as discussed. She is frankly concerned as a single parent, having to give up her years of seniority and benefits, but that becomes a trap.  She  will need to see what she can work out. Voc Rehab and relocation/ employment counseling resources within the Civil Service system should be utilized as available. She has had the standard treatments, which have not controlled her. I would  be loathe to ask her to suppress symptoms with maintenance steroids so that she can remain in a "toxic" environment. She is not disabled from employment except that she should not work where she so clearly experiences this problem. I will send an allergy Profile looking at in vitro IgE status, and an angioedema profile. She indicates she also had work status problems related to a back injury, for which I did not assess her.  Problem # 2:  ASTHMA (ICD-493.90) I am concerned that she has a chronic fixed obstructive asthma revealed  by available PFTs. If IgE involvement is significant, she might be a candidate for Xolair, which might help also with the urticaria. Her last steroid was an injection June 6.  Medications Added to Medication  List This Visit: 1)  Xyzal 5 Mg Tabs (Levocetirizine dihydrochloride) .... Take 1 by mouth once daily 2)  Allegra 180 Mg Tabs (Fexofenadine hcl) .... Take 1 by mouth once daily 3)  Advair Diskus 100-50 Mcg/dose Aepb (Fluticasone-salmeterol) .[name].. 1 puff two times a day and rinse mouth well 4)  Epipen 0.3 Mg/0.20ml Devi (Epinephrine) .... Use as directed as needed severe allergic reaction 5)  Proair Hfa 108 (90 Base) Mcg/act Aers (Albuterol sulfate) .... 2 puffs four times a day as needed 6)  Cyanocobalamin 1000 Mcg/ml Soln (Cyanocobalamin) .... Injection once monthly 7)  Lexapro 20 Mg Tabs (Escitalopram oxalate) .... Take 1  by mouth once daily 8)  Alprazolam 0.5 Mg Tabs (Alprazolam) .... Take 1 by mouth three times a day as needed 9)  Gabapentin 300 Mg Caps (Gabapentin) .... Take 1 by mouth three times a day 10)  Aleve 220 Mg Tabs (Naproxen sodium) .... Take 2 by mouth once daily 11)  Singulair 10 Mg Tabs (Montelukast sodium) .... Take 1 by mouth once daily  Other Orders: New Patient Level IV (45409) TLB-CBC Platelet - w/Differential (85025-CBCD) TLB-Sedimentation Rate (ESR) (85652-ESR) T-Allergy Profile Region [place], [place], [place], [place], [place] [phone]) T-Food Allergy Profile Specific IgE (86003/[id number]) T- * Misc. Laboratory test [phone])  Patient Instructions: 1)  Please schedule a follow-up appointment in 3 weeks. 2)  Lab

## 2011-01-24 NOTE — Miscellaneous (Signed)
Summary: [facility]   Imported By: Lester [place] 08/04/2010 10:08:51  _____________________________________________________________________  External Attachment:    Type:   Image     Comment:   External Document

## 2011-01-24 NOTE — Progress Notes (Signed)
  Phone Note Other Incoming   Request: Send information Summary of Call: Request for records received from [employer] of [employer]: Office of Workers'Comp Program. Request forwarded to [employer].

## 2011-01-24 NOTE — Progress Notes (Signed)
Summary: copy of worker's comp letter from [facility] [employer]  Phone Note Call from Patient Call back at Home Phone [phone]   Caller: Patient Call For: young Reason for Call: Talk to Nurse Summary of Call: looking for a copy of a letter that was in her records she left w/young - letter from [employer], dated 07/01/2010, re: return to work letter. Initial call taken by: Eugene Gavia,  August 10, 2010 2:17 PM  Follow-up for Phone Call        unable to locate letter in EMR or on Katie's/CDY's desks.  patient should call [employer] to get copy from them.  called spoke with patient to inform her of this.  patient states that she has already called [employer] and they will reprint the letter from their EMR for her.  pt to keep appt next week 8.25.11. Follow-up by: Boone Master CNA/MA,  August 10, 2010 2:44 PM

## 2011-01-26 NOTE — Assessment & Plan Note (Signed)
Summary: 4 months/apc   Primary Provider/Referring Provider:  Dorothyann Peng, MD  CC:  4 month follow up visit-asthma no wheezing but has had 1 allergic episode last week at work(unsure what caused it)..  History of Present Illness: CC:  3 week follow up visit-allergies-no complaints; Recent Iron transfusion-low levels..  History of Present Illness: August 18, 2010-  Work related urticaria Dr Myna Hidalgo giving iron infusion for anemia. She remains out of work since May 27, 2010 now for her back problems and for the urticaria.. Brings forms for me to update. Feels well today with no rash. Forgot to bring me a picture of her when swollen. [facility] can't work with her - some issue left over from her back problems- She says what she wants is to work in a "safe environment" or to get disability if she can't work.  December 09, 2010-  Work related urticaria Nurse-CC: 4 month follow up visit-asthma no wheezing but has had 1 allergic episode last week at work(unsure what caused it). She is now working in a labor-relations office at work and not exposed to the machines on the work floor where she has so much trouble.  Reports a single epiosode at work with a hive on lip that became angioedema of lip. Didn't have any  benadryl. She was seen by nurse at work. She thinks it may have been triggered by handling a paper file. Otherwise she has done better with no other episodes and no wheeze or need for her rescue inhaler. Continues daily Xyzal with as needed benadryl.  [name] treating bulging disk and she may need surgery.     Asthma History    Asthma Control Assessment:    Age range: 12+ years    Symptoms: 0-2 days/week    Nighttime Awakenings: 0-2/month    Interferes w/ normal activity: no limitations    SABA use (not for EIB): 0-2 days/week    Asthma Control Assessment: Well Controlled   Preventive Screening-Counseling & Management  Alcohol-Tobacco     Smoking Status: never  Current  Medications (verified): 1)  Xyzal 5 Mg Tabs (Levocetirizine Dihydrochloride) .... Take 1 By Mouth Once Daily 2)  Allegra 180 Mg Tabs (Fexofenadine Hcl) .... Take 1 By Mouth Once Daily 3)  Advair Diskus 100-50 Mcg/dose Aepb (Fluticasone-Salmeterol) .[name].. 1 Puff Two Times A Day and Rinse Mouth Well 4)  Epipen 0.3 Mg/0.95ml Devi (Epinephrine) .... Use As Directed As Needed Severe Allergic Reaction 5)  Proair Hfa 108 (90 Base) Mcg/act Aers (Albuterol Sulfate) .... 2 Puffs Four Times A Day As Needed 6)  Cyanocobalamin 1000 Mcg/ml Soln (Cyanocobalamin) .... Injection Once Monthly 7)  Lexapro 20 Mg Tabs (Escitalopram Oxalate) .... Take 1  By Mouth Once Daily 8)  Alprazolam 0.5 Mg Tabs (Alprazolam) .... Take 1 By Mouth Three Times A Day As Needed 9)  Gabapentin 300 Mg Caps (Gabapentin) .... Take 1 By Mouth Three Times A Day 10)  Aleve 220 Mg Tabs (Naproxen Sodium) .... Take 2 By Mouth Once Daily 11)  Singulair 10 Mg Tabs (Montelukast Sodium) .... Take 1 By Mouth Once Daily 12)  Hydroxyzine Hcl 10 Mg Tabs (Hydroxyzine Hcl) .... Take 1 By Mouth Three Times A Day As Needed Cough 13)  Hydrocodone-Acetaminophen 5-500 Mg Tabs (Hydrocodone-Acetaminophen) .... Take 1/2 To 1 By Mouth Four Times A Day As Needed Back Pain 14)  Flexeril 10 Mg Tabs (Cyclobenzaprine Hcl) .... Take 1 By Mouth At Bedtime  Allergies (verified): 1)  ! 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 Took coumadin.  Past Surgical History: Last updated: 07/28/2010 Left Inguinal Hernia repair (Dr. Caswell Corwin) 1983 Bunion repair-Left Foot 2001  Family History: Last updated: 07/28/2010 Emphysema: Father(smoker) died Allergies: mother(seasonal) Heart Disease: mother-congenital heart condition Cancer: Grandfather(maternal),Mother, brother, uncle,cousins  Social History: Last updated: 07/28/2010 Divorced; child Never  smsoker ETOH-1 every 2 weeks Naval architect for [employer] Owns 1 dog and 1 kitten  Risk Factors: Smoking Status: never (12/09/2010)  Review of Systems      See HPI  The patient denies shortness of breath with activity, shortness of breath at rest, productive cough, non-productive cough, coughing up blood, chest pain, irregular heartbeats, acid heartburn, indigestion, loss of appetite, weight change, abdominal pain, difficulty swallowing, sore throat, tooth/dental problems, headaches, nasal congestion/difficulty breathing through nose, and sneezing.    Vital Signs:  Patient profile:   45 year old female Height:      63 inches Weight:      161.13 pounds BMI:     28.65 O2 Sat:      99 % on Room air Pulse rate:   78 / minute BP sitting:   116 / 72  (left arm) Cuff size:   regular  Vitals Entered By: Reynaldo Minium CMA (December 09, 2010 11:14 AM)  O2 Flow:  Room air CC: 4 month follow up visit-asthma no wheezing but has had 1 allergic episode last week at work(unsure what caused it).   Physical Exam  Additional Exam:  General: A/Ox3; pleasant and cooperative, NAD, apparently healthy, relaxed and pleasant SKIN: no rash, lesions. No hives now. NODES: no lymphadenopathy HEENT: [place]/AT, EOM- WNL, Conjuctivae- clear, PERRLA, TM-WNL, Nose- clear, Throat- clear and wnl, voice is normal NECK: Supple w/ fair ROM, JVD- none, normal carotid impulses w/o bruits Thyroid-  CHEST: Clear to P&A HEART: RRR, no m/g/r heard ABDOMEN: medium build ZOX:WRUE, nl pulses, no edema  NEURO: Grossly intact to observation      Impression & Recommendations:  Problem # 1:  URTICARIA (ICD-708.9)  Much better control.  She remains on daily Xyzal prescribed  by Dr [place] Callas.. 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 . I will see her back as needed.  She has a kitten her  daughter  brought home. We discussed environmental allergen exposure and control.   Problem # 2:  ASTHMA (ICD-493.90) Continuuing Advair and Singulair, not needing rescue inhaler.   Medications Added to Medication List This Visit: 1)  Hydrocodone-acetaminophen 5-500 Mg Tabs (Hydrocodone-acetaminophen) .... Take 1/2 to 1 by mouth four times a day as needed back pain 2)  Flexeril 10 Mg Tabs (Cyclobenzaprine hcl) .... Take 1 by mouth at bedtime  Other Orders: Est. Patient Level III (45409)  Patient Instructions: 1)  Please schedule a follow-up appointment as needed. 2)  Dr Allyne Gee can continue your Xyzal as needed.

## 2011-01-26 NOTE — Progress Notes (Signed)
Summary: HIVES/swelling  Phone Note Call from Patient Call back at Home Phone [phone]   Caller: Patient Call For: YOUNG Summary of Call: PT C/O HIVES. THIS BEGAN YESTERDAY WITH HIVES ON ELBOWS AND KNEES. THEN LAST NIGHT HER LIPS WERE SWOLLEN AND CONTINUED TO "ALL OVER FACE". TODAY LIPS ARE STILL SWOLLEN AS WELL AS UNDER HER EYES. ELBOWS AND KNEES STILL HAVE HIVES. PT HAS JUST TAKEN 1 BENADRYL. SHE TOOK CLARINEX THIS AM. [facility] Initial call taken by: Tivis Ringer, CNA,  December 13, 2010 2:05 PM  Follow-up for Phone Call        Called, spoke with pt.  States hives started yesterday on knees and elbows.  Last night lip started swelling and also face area aroung eyes, cheeks, and lip area swollen.  Took clarinex this am and benadryl this afternoon.  Itching relieved but still has some swelling although swelling is starting to improve.  Pt denies eating anything new/different and denies SOB and difficulty breathing. Requesting CY's recs.    [facility] Allergies: ! AMOXICILLIN  Follow-up by: Gweneth Dimitri RN,  December 13, 2010 3:37 PM  Additional Follow-up for Phone Call Additional follow up Details #1::        Per CDY-recommend Clarinex daily and Pepcid OTC x 2-3 days.Reynaldo Minium CMA  December 13, 2010 4:58 PM     Additional Follow-up for Phone Call Additional follow up Details #2::    Spoke with pt and notified of recs per CDY.  Pt verbalized understanding. Follow-up by: Vernie Murders,  December 13, 2010 5:02 PM

## 2011-01-27 NOTE — Letter (Signed)
Summary: Sidney Ace MD  Sidney Ace MD   Imported By: Lester [place] 08/04/2010 10:06:23  _____________________________________________________________________  External Attachment:    Type:   Image     Comment:   External Document

## 2011-03-14 NOTE — Letter (Signed)
Summary: [facility]  [facility]   Imported By: Sherian Rein 03/07/2011 08:35:06  _____________________________________________________________________  External Attachment:    Type:   Image     Comment:   External Document

## 2011-03-20 LAB — PROTIME-INR
INR: 1.1 (ref 0.00–1.49)
Prothrombin Time: 14.1 seconds (ref 11.6–15.2)

## 2011-04-02 LAB — D-DIMER, QUANTITATIVE: D-Dimer, Quant: 10.12 ug/mL-FEU — ABNORMAL HIGH (ref 0.00–0.48)

## 2011-04-06 ENCOUNTER — Encounter (HOSPITAL_BASED_OUTPATIENT_CLINIC_OR_DEPARTMENT_OTHER): Payer: Federal, State, Local not specified - PPO | Admitting: Hematology & Oncology

## 2011-04-06 DIAGNOSIS — D510 Vitamin B12 deficiency anemia due to intrinsic factor deficiency: Secondary | ICD-10-CM

## 2011-05-09 NOTE — Letter (Signed)
September 28, 2010     RE:  LAELA, DEVINEY  MRN:  [id number]  /  DOB:  [birth year]   To Whom It May Concern:   I write as attending physician on behalf of my patient Ms. Dolney who  has reported medical injury in the form of skin rash.  She has sent  previous reports.  I believe file number is [id number].  I first examined  Ms. Laakso for her complaint of recurrent urticaria (hives) on July 28, 2010, with follow up on August 18, 2010.  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  She has been evaluated by several different allergists  and dermatologists and her own general internist.  She has had repeated  allergy skin testing showing modest reactions to molds and dust, not  likely to be the basis for her complaint.  Blood tests have not  demonstrated a congenital predisposition to urticaria.  The working  diagnosis is urticaria or hives, probably related to a workplace  environmental exposure.   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  She began experiencing urticaria/hives shortly after  beginning work.  Subsequently, there was no improvement with move to a  new building with the same machines.  She improved with move off of the  work room floor between February 2009 and August 2010 except once when  carpet was taken up.  She was then out on medical leave from August 13, 2009, until May 25, 2010, with no hives.  On return to work on May 26, 2010, she again began experiencing hives as she worked with several  different machines.  This is the first episode of hives in a year.  She  says she never has hives at home.  Prednisone will control this rash,  but is associated with long-term steroid side effects.  She has tried a  variety of combinations of antihistamines under medical prescription  without benefit.  She is asking for opportunity to work in an  environment completely unexposed to the work room environment and mail  processing machines that have been associated with her symptoms.  It is  not possible to specifically identify the trigger, but these machines  are involved with inks and paper dust.   My impression is that she has developed an allergic response to  something related to the particular types of paper or inks being used by  the mail handling machines with which she has worked in this role.  From  her description it appears that if she can be completely removed from  that environment, including being removed from areas that share tracked  dust and ventilation with those work areas, then she may be able to  continue working.  It is significant that from her description, her skin  clears when she is away from the work area on prolonged medical leave.  It is also significant that the only medical regimen provided by several  appropriate specialists was long-term systemic steroids which are  associated with considerable potential side effects over  time.    Sincerely,      Clinton D. Maple Hudson, MD, Tonny Bollman, FACP  Electronically Signed    CDY/MedQ  DD: 09/28/2010  DT: 09/29/2010  Job #: [id number]   CC:   [employer]  Heidemarie D. [name]

## 2011-05-11 ENCOUNTER — Telehealth: Payer: Self-pay | Admitting: Internal Medicine

## 2011-05-11 MED ORDER — PREDNISONE 20 MG PO TABS: 20.0000 mg | ORAL_TABLET | Freq: Every day | ORAL | Status: AC

## 2011-05-11 NOTE — Telephone Encounter (Signed)
Spoke with pt and advised of recs per CDY. Pt verbalized understanding and rx was sent to pharm.

## 2011-05-11 NOTE — Telephone Encounter (Signed)
Called and spoke with pt and she stated that she has been working with older files at work and Tuesday her lips were swollen but cleared by the time she left work.  Her face is puffy under her eyes and at her cheekbones, she has hives on her elbows, right thigh and legs.  No breathing difficulty and she has a rescue inhaler if needed and epi pen, takes xyzal, clarinex and singulair daily.  Pt would like to know of anything else that she can take/try for these symptoms.  Please advise. thanks

## 2011-05-11 NOTE — Telephone Encounter (Signed)
LMTCB

## 2011-05-11 NOTE — Telephone Encounter (Signed)
Per CY--add prednisone 20mg   #5   1 daily x 5 days.

## 2011-05-12 NOTE — Op Note (Signed)
   Sherri [name], Sherri [name]                         ACCOUNT NO.:  [id number]   MEDICAL RECORD NO.:  [id number]                   PATIENT TYPE:  AMB   LOCATION:  ENDO                                 FACILITY:  [facility]   PHYSICIAN:  Anselmo Rod, M.D.               DATE OF BIRTH:  [birth year]   DATE OF PROCEDURE:  06/22/2003  DATE OF DISCHARGE:                                 OPERATIVE REPORT   PROCEDURE PERFORMED:  Screening colonoscopy.   ENDOSCOPIST:  Charna Elizabeth, M.D.   INSTRUMENT USED:  Olympus video colonoscope.   INDICATIONS FOR PROCEDURE:  The patient is a 45 year old African-American  female undergoing screening colonoscopy for family history of colon cancer  in a first degree relative.  Rule out colonic polyps, masses, etc.   PREPROCEDURE PREPARATION:  Informed consent was procured from the patient.  The patient was fasted for eight hours prior to the procedure and prepped  with a bottle of magnesium citrate and a gallon of GoLYTELY the night prior  to the procedure.   PREPROCEDURE PHYSICAL:  The patient had stable vital signs.  Neck supple.  Chest clear to auscultation.  S1 and S2 regular.  Abdomen soft with normal  bowel sounds.   DESCRIPTION OF PROCEDURE:  The patient was placed in left lateral decubitus  position and sedated with 100 mg of Demerol and 10 mg of Versed  intravenously.  Once the patient was adequately sedated and maintained on  low flow oxygen and continuous cardiac monitoring, the Olympus video  colonoscope was advanced from the rectum to the cecum without difficulty.  No masses, polyps, erosions, ulcerations or diverticula were seen.  The  appendicular orifice and ileocecal valve were clearly visualized and  photographed.  The terminal ileum appeared normal.  Retroflexion in the  rectum revealed no abnormalities.   IMPRESSION:  Normal colonoscopy up to the terminal ileum.   RECOMMENDATIONS:  1. Repeat colorectal cancer screening is recommended in  the next five years     unless the patient develops any abnormal symptoms in the interim.  2. A high fiber diet with liberal fluid intake has been advocated.  3. Outpatient follow-up as need arises in the future.                                                  Anselmo Rod, M.D.    JNM/MEDQ  D:  06/22/2003  T:  06/23/2003  Job:  [id number]   cc:   Gabriel Earing, M.D.  [address]  [place]  [place] [address]  Fax: [phone]

## 2011-05-12 NOTE — Procedures (Signed)
[place]. [facility]  Patient:    Sherri [name], Sherri [name]                      MRN: [id number]. Date: 03/13/01 Attending:  Marlan Palau, M.D.                           Procedure Report  PROCEDURE:  Lumbar puncture.  HISTORY:  This is a 45 year old patient with progressive increase in spasticity in the left leg.  The etiology is not clear.  MRI scans of the cervical and thoracic spine and brain have been unremarkable.  The patient is being evaluated for this progressive alteration in motor tone.  DESCRIPTION OF PROCEDURE:  Lumbar puncture was performed with the patient in the fetal position on the left side.  Risks of the procedure were explained to the patient, including bleeding, infection, nerve damage, and headache.  The patient has consented to the procedure.  The lower back was cleaned with Betadine solution, and approximately 2 cc of 1% Xylocaine was used as a local anesthetic.  A 20-gauge spinal needle was inserted in the L3-4 interspace, and approximately 18 cc of clear, colorless spinal fluid was removed for testing. The lumbar puncture was slightly traumatic, but the spinal fluid cleared rapidly.  Tube #1 was sent for VDRL and cryptococcal antigen.  Tube #2 was sent for oligoclonal banding, IgG-albumin ratio.  Tube #3 was sent for cells, differential, glucose, protein.  Tube #4 was sent for L[place] panel.  No complications to the above procedure were noted.  Blood work was also sent for serum copper, ceruloplasmin level, anti-GM1 antibodies, HIV titer, HTLV1 titer.  Again, patient tolerated the procedure well. DD:  03/13/01 TD:  03/13/01 Job: [id number]

## 2011-07-05 ENCOUNTER — Other Ambulatory Visit: Payer: Self-pay | Admitting: Hematology & Oncology

## 2011-07-05 ENCOUNTER — Encounter (HOSPITAL_BASED_OUTPATIENT_CLINIC_OR_DEPARTMENT_OTHER): Payer: Federal, State, Local not specified - PPO | Admitting: Hematology & Oncology

## 2011-07-05 DIAGNOSIS — Z86718 Personal history of other venous thrombosis and embolism: Secondary | ICD-10-CM

## 2011-07-05 DIAGNOSIS — D510 Vitamin B12 deficiency anemia due to intrinsic factor deficiency: Secondary | ICD-10-CM

## 2011-07-05 LAB — CBC WITH DIFFERENTIAL (CANCER CENTER ONLY)
BASO%: 0 % (ref 0.0–2.0)
Eosinophils Absolute: 0 10*3/uL (ref 0.0–0.5)
LYMPH#: 1.8 10*3/uL (ref 0.9–3.3)
MONO#: 0.4 10*3/uL (ref 0.1–0.9)
NEUT#: 3.3 10*3/uL (ref 1.5–6.5)
NEUT%: 60.5 % (ref 39.6–80.0)
Platelets: 270 10*3/uL (ref 145–400)
RBC: 4.34 10*6/uL (ref 3.70–5.32)
RDW: 13 % (ref 11.1–15.7)
WBC: 5.5 10*3/uL (ref 3.9–10.0)

## 2011-07-07 LAB — COMPREHENSIVE METABOLIC PANEL
ALT: 12 U/L (ref 0–35)
AST: 18 U/L (ref 0–37)
Albumin: 4.1 g/dL (ref 3.5–5.2)
CO2: 23 mEq/L (ref 19–32)
Calcium: 9.3 mg/dL (ref 8.4–10.5)
Chloride: 105 mEq/L (ref 96–112)
Creatinine, Ser: 1.06 mg/dL (ref 0.50–1.10)
Glucose, Bld: 104 mg/dL — ABNORMAL HIGH (ref 70–99)
Potassium: 3.7 mEq/L (ref 3.5–5.3)

## 2011-07-07 LAB — SPEP & IFE WITH QIG
Alpha-1-Globulin: 4.2 % (ref 2.9–4.9)
Beta 2: 5.7 % (ref 3.2–6.5)
Beta Globulin: 6.1 % (ref 4.7–7.2)
Gamma Globulin: 17.5 % (ref 11.1–18.8)
IgM, Serum: 238 mg/dL (ref 52–322)

## 2011-07-07 LAB — VITAMIN B12: Vitamin B-12: 510 pg/mL (ref 211–911)

## 2011-07-07 LAB — RETICULOCYTES (CHCC)
ABS Retic: 70.2 10*3/uL (ref 19.0–186.0)
RBC.: 4.39 MIL/uL (ref 3.87–5.11)

## 2011-07-07 LAB — KAPPA/LAMBDA LIGHT CHAINS: Kappa:Lambda Ratio: 0.22 — ABNORMAL LOW (ref 0.26–1.65)

## 2011-08-02 ENCOUNTER — Other Ambulatory Visit: Payer: Self-pay | Admitting: Hematology & Oncology

## 2011-08-02 ENCOUNTER — Encounter (HOSPITAL_BASED_OUTPATIENT_CLINIC_OR_DEPARTMENT_OTHER): Payer: Federal, State, Local not specified - PPO | Admitting: Hematology & Oncology

## 2011-08-02 DIAGNOSIS — Z86718 Personal history of other venous thrombosis and embolism: Secondary | ICD-10-CM

## 2011-08-02 DIAGNOSIS — D510 Vitamin B12 deficiency anemia due to intrinsic factor deficiency: Secondary | ICD-10-CM

## 2011-08-02 LAB — CBC WITH DIFFERENTIAL (CANCER CENTER ONLY)
BASO#: 0 10*3/uL (ref 0.0–0.2)
BASO%: 0 % (ref 0.0–2.0)
HCT: 35.1 % (ref 34.8–46.6)
LYMPH#: 1.9 10*3/uL (ref 0.9–3.3)
MCH: 29 pg (ref 26.0–34.0)
MONO#: 0.4 10*3/uL (ref 0.1–0.9)
NEUT#: 4.8 10*3/uL (ref 1.5–6.5)
Platelets: 286 10*3/uL (ref 145–400)
RDW: 12.8 % (ref 11.1–15.7)
WBC: 7.1 10*3/uL (ref 3.9–10.0)

## 2011-08-02 LAB — CHCC SATELLITE - SMEAR

## 2011-08-04 LAB — COMPREHENSIVE METABOLIC PANEL
ALT: 19 U/L (ref 0–35)
Albumin: 3.9 g/dL (ref 3.5–5.2)
BUN: 17 mg/dL (ref 6–23)
CO2: 23 mEq/L (ref 19–32)
Calcium: 8.9 mg/dL (ref 8.4–10.5)
Chloride: 106 mEq/L (ref 96–112)
Potassium: 4 mEq/L (ref 3.5–5.3)
Sodium: 138 mEq/L (ref 135–145)
Total Protein: 6.6 g/dL (ref 6.0–8.3)

## 2011-08-04 LAB — SPEP & IFE WITH QIG
Alpha-1-Globulin: 4.1 % (ref 2.9–4.9)
Alpha-2-Globulin: 9.6 % (ref 7.1–11.8)
Beta 2: 5.9 % (ref 3.2–6.5)
Gamma Globulin: 17.2 % (ref 11.1–18.8)
IgA: 206 mg/dL (ref 68–380)
IgM, Serum: 198 mg/dL (ref 52–322)

## 2011-08-04 LAB — RETICULOCYTES (CHCC)
ABS Retic: 62.9 10*3/uL (ref 19.0–186.0)
Retic Ct Pct: 1.5 % (ref 0.4–2.3)

## 2011-08-04 LAB — KAPPA/LAMBDA LIGHT CHAINS
Kappa free light chain: 1.06 mg/dL (ref 0.33–1.94)
Kappa:Lambda Ratio: 0.71 (ref 0.26–1.65)

## 2011-08-04 LAB — IRON AND TIBC: Iron: 117 ug/dL (ref 42–145)

## 2011-08-04 LAB — FERRITIN: Ferritin: 144 ng/mL (ref 10–291)

## 2011-08-04 LAB — TSH: TSH: 1.057 u[IU]/mL (ref 0.350–4.500)

## 2011-08-04 LAB — VITAMIN B12: Vitamin B-12: 561 pg/mL (ref 211–911)

## 2011-10-02 ENCOUNTER — Encounter: Payer: Self-pay | Admitting: *Deleted

## 2011-10-03 ENCOUNTER — Telehealth: Payer: Self-pay | Admitting: Pulmonary Disease

## 2011-10-03 MED ORDER — PREDNISONE 10 MG PO TABS: ORAL_TABLET | ORAL | Status: DC

## 2011-10-03 NOTE — Telephone Encounter (Signed)
OK script prednisone 10mg , # 20, 4 X 2 DAYS, 3 X 2 DAYS, 2 X 2 DAYS, 1 X 2 DAYS Use this if antihistamines not sufficient

## 2011-10-03 NOTE — Telephone Encounter (Signed)
I spoke with pt and she states on Friday she had noticed some swelling in her face and lips. Pt states she is not sure where it is coming from. Pt states today her lips are not swollen but still is having some puffiness in her face. Pt is requesting recs. I offered pt OV  but states she can't get off work to come in and be seen. Please advise Dr. Maple Hudson, thanks  Allergies  Allergen Reactions  . Amoxicillin     REACTION: diarrhea    Carver Fila, CMA

## 2011-10-03 NOTE — Telephone Encounter (Signed)
Pt aware of cdy recs and rx was sent to pharmacy

## 2011-10-30 ENCOUNTER — Other Ambulatory Visit: Payer: Self-pay | Admitting: Hematology & Oncology

## 2011-10-30 ENCOUNTER — Other Ambulatory Visit (HOSPITAL_BASED_OUTPATIENT_CLINIC_OR_DEPARTMENT_OTHER): Payer: Federal, State, Local not specified - PPO | Admitting: Lab

## 2011-10-30 ENCOUNTER — Encounter: Payer: Self-pay | Admitting: Hematology & Oncology

## 2011-10-30 ENCOUNTER — Ambulatory Visit (HOSPITAL_BASED_OUTPATIENT_CLINIC_OR_DEPARTMENT_OTHER): Payer: Federal, State, Local not specified - PPO | Admitting: Hematology & Oncology

## 2011-10-30 VITALS — BP 117/76 | HR 89 | Temp 97.7°F | Ht 64.0 in | Wt 153.0 lb

## 2011-10-30 DIAGNOSIS — D510 Vitamin B12 deficiency anemia due to intrinsic factor deficiency: Secondary | ICD-10-CM

## 2011-10-30 DIAGNOSIS — Z86718 Personal history of other venous thrombosis and embolism: Secondary | ICD-10-CM

## 2011-10-30 DIAGNOSIS — E538 Deficiency of other specified B group vitamins: Secondary | ICD-10-CM

## 2011-10-30 HISTORY — DX: Vitamin B12 deficiency anemia due to intrinsic factor deficiency: D51.0

## 2011-10-30 LAB — CBC WITH DIFFERENTIAL (CANCER CENTER ONLY)
BASO%: 0 % (ref 0.0–2.0)
EOS%: 0 % (ref 0.0–7.0)
HGB: 12.8 g/dL (ref 11.6–15.9)
LYMPH#: 2.7 10*3/uL (ref 0.9–3.3)
LYMPH%: 36.4 % (ref 14.0–48.0)
MCH: 29 pg (ref 26.0–34.0)
MCHC: 34.4 g/dL (ref 32.0–36.0)
MCV: 84 fL (ref 81–101)
MONO%: 7.3 % (ref 0.0–13.0)
Platelets: 279 10*3/uL (ref 145–400)
RDW: 13 % (ref 11.1–15.7)

## 2011-10-30 LAB — LACTATE DEHYDROGENASE: LDH: 151 U/L (ref 94–250)

## 2011-10-30 LAB — IRON AND TIBC
%SAT: 21 % (ref 20–55)
Iron: 68 ug/dL (ref 42–145)
TIBC: 330 ug/dL (ref 250–470)
UIBC: 262 ug/dL (ref 125–400)

## 2011-10-30 LAB — RETICULOCYTES (CHCC): ABS Retic: 80.8 10*3/uL (ref 19.0–186.0)

## 2011-10-30 LAB — FERRITIN: Ferritin: 193 ng/mL (ref 10–291)

## 2011-10-30 LAB — VITAMIN B12: Vitamin B-12: 552 pg/mL (ref 211–911)

## 2011-10-30 LAB — CHCC SATELLITE - SMEAR

## 2011-10-30 MED ORDER — CYANOCOBALAMIN 1000 MCG/ML IJ SOLN: 1000.0000 ug | Freq: Once | INTRAMUSCULAR | Status: DC

## 2011-10-30 MED ORDER — CYANOCOBALAMIN 1000 MCG/ML IJ SOLN
1000.0000 ug | Freq: Once | INTRAMUSCULAR | Status: AC
  Administered 2011-10-30: 1000 ug via INTRAMUSCULAR

## 2011-10-30 NOTE — Progress Notes (Signed)
CSN: [id number] Sherri [name] 10/30/2011 This office note has been dictated.

## 2011-10-30 NOTE — Progress Notes (Signed)
CC:   Sherri D. Maple Hudson, MD, FCCP, FACP Sherri [name], M.D. Sherri [name]. Sherri [name], M.D.  DIAGNOSES: 1. Pernicious anemia, anti-intrinsic factor antibodies. 2. 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  CURRENT THERAPY:  Vitamin B12 1 mg IM every 3 months.  INTERIM HISTORY:  Sherri [name] comes in for followup.  She is really looking better.  Her back is not bothering her as much.  She sees Dr. Annell [name] for this.  She is trying to prevent any surgery from happening.  She is still working for [employer].  Her daughter is not causing her too many issues right now.  This has certainly taken a lot of stress from her.  Apparently, there is no new medication that she is taking. She has had no cough or shortness of breath.  There has been no nausea or vomiting.  There has been no leg swelling.  There has been no rashes. She has not noticed any change in bowel or bladder habits.  She has noted unusual erratic menstrual cycles.  She has not had any hot flashes.  PHYSICAL EXAMINATION:  General:  This is a well-developed, well- nourished black female in no obvious distress.  Vital Signs: Temperature 97.2, pulse 89, respiratory rate 16, blood pressure 117/76. Weight is 153.  Head/Neck:  Normocephalic, atraumatic skull.  There are no ocular or oral lesions.  There are no palpable cervical or supraclavicular lymph nodes.  Lungs:  Clear to percussion and auscultation bilaterally.  Cardiac:  Regular rate and rhythm with a normal S1, S2.  There are no murmurs, rubs or bruits.  Abdomen:  Soft with good bowel sounds.  There is no palpable abdominal mass.  There is no fluid wave.  There is no palpable hepatosplenomegaly.  Back: No tenderness of the spine, ribs, or hips.  Extremities:  No clubbing, cyanosis or edema.  Neurologic:  No focal neurological deficits.  LABORATORY STUDIES:  White cell count is 7.4, hemoglobin 12.8, hematocrit 37.2, platelet count 279.  MCV is 84.  IMPRESSION:  [name] is 45 year old African female with pernicious anemia.  She is on vitamin B12 every 3 months.  We will go ahead and give her a dose today.  She has not had a mammogram in over a year.  She says she will certainly get 1 done.  She sees Dr. Fannie [name] as her primary care doctor.  We will plan to get her back in another 3 months for followup.    ______________________________ Sherri [name], M.D. PRE/MEDQ  D:  10/30/2011  T:  10/30/2011  Job:  [id number]

## 2011-11-06 ENCOUNTER — Telehealth: Payer: Self-pay | Admitting: Internal Medicine

## 2011-11-06 MED ORDER — LEVOCETIRIZINE DIHYDROCHLORIDE 5 MG PO TABS: 5.0000 mg | ORAL_TABLET | Freq: Every evening | ORAL | Status: DC

## 2011-11-06 MED ORDER — DESLORATADINE 5 MG PO TABS: 5.0000 mg | ORAL_TABLET | Freq: Every day | ORAL | Status: DC

## 2011-11-06 NOTE — Telephone Encounter (Signed)
Per last ov note from Dec 2011- okay to f/u prn and have PCP refill meds. I spoke with pt and she states has not been seen by her PCP in a while, but will make appt with her soon. I advised that we will refill meds this time, and if her PCP not willing to refill she will at least need to see [place] once per year in order to keep refilling. Pt verbalized understanding and refills sent x 1 only.

## 2011-11-09 ENCOUNTER — Telehealth: Payer: Self-pay | Admitting: *Deleted

## 2011-11-09 NOTE — Telephone Encounter (Signed)
Spoke to pt regarding labs from 10/30/11...normal per Dr. Myna Hidalgo. Pt verbalized understanding.

## 2011-11-24 ENCOUNTER — Encounter (HOSPITAL_COMMUNITY): Payer: Self-pay | Admitting: *Deleted

## 2011-11-24 ENCOUNTER — Emergency Department (INDEPENDENT_AMBULATORY_CARE_PROVIDER_SITE_OTHER)
Admission: EM | Admit: 2011-11-24 | Discharge: 2011-11-24 | Disposition: A | Discharge status: Home / Self Care | Payer: Federal, State, Local not specified - PPO | Source: Home / Self Care | Attending: Emergency Medicine | Admitting: Emergency Medicine

## 2011-11-24 DIAGNOSIS — J069 Acute upper respiratory infection, unspecified: Secondary | ICD-10-CM

## 2011-11-24 HISTORY — DX: Other intervertebral disc displacement, lumbar region: M51.26

## 2011-11-24 HISTORY — DX: Other allergy status, other than to drugs and biological substances: Z91.09

## 2011-11-24 HISTORY — DX: Other seasonal allergic rhinitis: J30.2

## 2011-11-24 HISTORY — DX: Angioneurotic edema, initial encounter: T78.3XXA

## 2011-11-24 HISTORY — DX: Acute embolism and thrombosis of unspecified deep veins of unspecified lower extremity: I82.409

## 2011-11-24 MED ORDER — DEXAMETHASONE 4 MG PO TABS: ORAL_TABLET | ORAL | Status: AC

## 2011-11-24 MED ORDER — HYDROCODONE-HOMATROPINE 5-1.5 MG/5ML PO SYRP: 5.0000 mL | ORAL_SOLUTION | Freq: Four times a day (QID) | ORAL | Status: AC | PRN

## 2011-11-24 MED ORDER — FLUTICASONE PROPIONATE 50 MCG/ACT NA SUSP: 2.0000 | Freq: Every day | NASAL | Status: DC

## 2011-11-24 NOTE — ED Notes (Signed)
Started yesterday with runny nose; now has productive cough, nasal congestion, chills, light sensitivity with eyes, decreased appetite.  Denies fevers.  Has been taking OTC Sinus & Allergy med without relief.

## 2011-11-24 NOTE — ED Provider Notes (Signed)
History     CSN: [id number] Arrival date & time: 11/24/2011  6:05 PM   First MD Initiated Contact with Patient 11/24/11 1709      Chief Complaint  Patient presents with  . Nasal Congestion  . Cough    (Consider location/radiation/quality/duration/timing/severity/associated sxs/prior treatment) HPI Comments: Pt with rhinorrhea, postnasal drip, ST, nonproductive cough, fatigue, ear fullness x 3 days. Unable to sleep at night secondary to coughing. Reports some wheezing, chest tightness- thinks asthma may be acting up. No fevers. No ear pain, abd pain, wheeze, SOB, abd pain, rash, N/V. Slightly decreased appetite but is tolerating po.       Patient is a 45 y.o. female presenting with cough and URI. The history is provided by the patient.  Cough Associated symptoms include rhinorrhea, sore throat and wheezing. Pertinent negatives include no chest pain, no chills, no ear pain, no headaches and no shortness of breath.  URI The primary symptoms include sore throat, cough and wheezing. Primary symptoms do not include fever, headaches, ear pain, abdominal pain, nausea, vomiting or rash.  The sore throat is not accompanied by trouble swallowing.  Symptoms associated with the illness include plugged ear sensation, congestion and rhinorrhea. The illness is not associated with chills, facial pain or sinus pressure.    Past Medical History  Diagnosis Date  . Pernicious anemia 10/30/2011  . Lumbar herniated disc   . Environmental allergies   . Angioedema   . DVT (deep venous thrombosis)   . Asthma   . Seasonal allergies     Past Surgical History  Procedure Date  . Hernia repair     History reviewed. No pertinent family history.  History  Substance Use Topics  . Smoking status: Never Smoker   . Smokeless tobacco: Not on file  . Alcohol Use: Yes     Occasional    OB History    Grav Para Term Preterm Abortions TAB SAB Ect Mult Living                  Review of Systems    Constitutional: Negative for fever and chills.  HENT: Positive for congestion, sore throat, rhinorrhea and postnasal drip. Negative for ear pain, trouble swallowing, voice change and sinus pressure.   Respiratory: Positive for cough, chest tightness and wheezing. Negative for shortness of breath.   Cardiovascular: Negative for chest pain.  Gastrointestinal: Negative for nausea, vomiting, abdominal pain and diarrhea.  Skin: Negative for rash.  Neurological: Negative for headaches.    Allergies  Amoxicillin  Home Medications   Current Outpatient Rx  Name Route Sig Dispense Refill  . PROAIR HFA IN Inhalation Inhale into the lungs.      . ALPRAZOLAM 0.5 MG PO TABS Oral Take 0.5 mg by mouth.      . CYANOCOBALAMIN 1000 MCG/ML IJ SOLN Intramuscular Inject 1,000 mcg into the muscle every 3 (three) months.     . DESLORATADINE 5 MG PO TABS Oral Take 1 tablet (5 mg total) by mouth daily. 30 tablet 0  . DIPHENHYDRAMINE HCL 25 MG PO CAPS Oral Take 25 mg by mouth at bedtime as needed.     [name] EPINEPHRINE 0.3 MG/0.3ML IJ DEVI Intramuscular Inject 0.3 mg into the muscle once.      [name] ESCITALOPRAM OXALATE 20 MG PO TABS Oral Take 20 mg by mouth daily.      [name] FLUTICASONE-SALMETEROL 100-50 MCG/DOSE IN AEPB Inhalation Inhale 1 puff into the lungs every 12 (twelve) hours.      [name]  GABAPENTIN 300 MG PO CAPS Oral Take 300 mg by mouth 3 (three) times daily.     [name] LEVOCETIRIZINE DIHYDROCHLORIDE 5 MG PO TABS Oral Take 1 tablet (5 mg total) by mouth every evening. 30 tablet 0  . MONTELUKAST SODIUM 10 MG PO TABS Oral Take 10 mg by mouth at bedtime.      . ALEVE PO Oral Take by mouth.      . DEXAMETHASONE 4 MG PO TABS  4 tabs po at once on day one, 4 tabs po at once on day 2 8 tablet 0  . FLUTICASONE PROPIONATE 50 MCG/ACT NA SUSP Nasal Place 2 sprays into the nose daily. 16 g 0  . HYDROCODONE-HOMATROPINE 5-1.5 MG/5ML PO SYRP Oral Take 5 mLs by mouth every 6 (six) hours as needed for cough or pain. 120 mL 0    BP  122/79  Pulse 95  Temp(Src) 98.3 F (36.8 C) (Oral)  Resp 16  SpO2 99%  LMP 11/03/2011  Physical Exam  Nursing note and vitals reviewed. Constitutional: She is oriented to person, place, and time. She appears well-developed and well-nourished.  HENT:  Head: Normocephalic and atraumatic.  Right Ear: Tympanic membrane and ear canal normal.  Left Ear: Tympanic membrane and ear canal normal.  Nose: Mucosal edema and rhinorrhea present. No epistaxis.  Mouth/Throat: Uvula is midline and mucous membranes are normal. Posterior oropharyngeal erythema present. No oropharyngeal exudate.       (-) frontal, maxillary sinus tenderness  Eyes: Conjunctivae and EOM are normal. Pupils are equal, round, and reactive to light.  Neck: Normal range of motion. Neck supple.  Cardiovascular: Normal rate, regular rhythm and normal heart sounds.   Pulmonary/Chest: Effort normal and breath sounds normal. No respiratory distress. She has no wheezes. She has no rales.  Abdominal: Soft. Bowel sounds are normal.  Musculoskeletal: Normal range of motion.  Lymphadenopathy:    She has no cervical adenopathy.  Neurological: She is alert and oriented to person, place, and time.  Skin: Skin is warm and dry. No rash noted.  Psychiatric: She has a normal mood and affect. Her behavior is normal. Judgment and thought content normal.    ED Course  Procedures (including critical care time)  Labs Reviewed - No data to display No results found.   1. URI (upper respiratory infection)     MDM  Advised pt to wait on steroids- will start with increased use albuterol but if wheeze, chest tightness, cough gets worse will fill steroids  Luiz Blare, MD 11/24/11 1916

## 2011-12-29 ENCOUNTER — Emergency Department (HOSPITAL_COMMUNITY): Payer: Federal, State, Local not specified - PPO

## 2011-12-29 ENCOUNTER — Encounter (HOSPITAL_COMMUNITY): Payer: Self-pay | Admitting: *Deleted

## 2011-12-29 ENCOUNTER — Emergency Department (HOSPITAL_COMMUNITY)
Admission: EM | Admit: 2011-12-29 | Discharge: 2011-12-29 | Disposition: A | Discharge status: Home / Self Care | Payer: Federal, State, Local not specified - PPO | Attending: Emergency Medicine | Admitting: Emergency Medicine

## 2011-12-29 DIAGNOSIS — Z86718 Personal history of other venous thrombosis and embolism: Secondary | ICD-10-CM | POA: Insufficient documentation

## 2011-12-29 DIAGNOSIS — J45909 Unspecified asthma, uncomplicated: Secondary | ICD-10-CM | POA: Insufficient documentation

## 2011-12-29 DIAGNOSIS — M25519 Pain in unspecified shoulder: Secondary | ICD-10-CM | POA: Insufficient documentation

## 2011-12-29 DIAGNOSIS — Z79899 Other long term (current) drug therapy: Secondary | ICD-10-CM | POA: Insufficient documentation

## 2011-12-29 DIAGNOSIS — R51 Headache: Secondary | ICD-10-CM | POA: Insufficient documentation

## 2011-12-29 DIAGNOSIS — M542 Cervicalgia: Secondary | ICD-10-CM | POA: Insufficient documentation

## 2011-12-29 DIAGNOSIS — M545 Low back pain, unspecified: Secondary | ICD-10-CM | POA: Insufficient documentation

## 2011-12-29 DIAGNOSIS — M546 Pain in thoracic spine: Secondary | ICD-10-CM | POA: Insufficient documentation

## 2011-12-29 MED ORDER — DIAZEPAM 5 MG PO TABS: 5.0000 mg | ORAL_TABLET | Freq: Once | ORAL | Status: DC

## 2011-12-29 MED ORDER — OXYCODONE-ACETAMINOPHEN 5-325 MG PO TABS
1.0000 | ORAL_TABLET | Freq: Once | ORAL | Status: AC
  Administered 2011-12-29: 1 via ORAL
  Filled 2011-12-29: qty 1

## 2011-12-29 MED ORDER — OXYCODONE-ACETAMINOPHEN 5-325 MG PO TABS: 1.0000 | ORAL_TABLET | Freq: Once | ORAL | Status: DC

## 2011-12-29 MED ORDER — IBUPROFEN 200 MG PO TABS
600.0000 mg | ORAL_TABLET | Freq: Once | ORAL | Status: AC
  Administered 2011-12-29: 600 mg via ORAL
  Filled 2011-12-29: qty 3

## 2011-12-29 MED ORDER — DIAZEPAM 5 MG PO TABS
5.0000 mg | ORAL_TABLET | Freq: Once | ORAL | Status: AC
  Administered 2011-12-29: 5 mg via ORAL
  Filled 2011-12-29: qty 1

## 2011-12-29 MED ORDER — IBUPROFEN 600 MG PO TABS: 600.0000 mg | ORAL_TABLET | Freq: Once | ORAL | Status: DC
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
## 2011-12-30 NOTE — ED Provider Notes (Signed)
Medical screening examination/treatment/procedure(s) were performed by non-physician practitioner and as supervising physician I was immediately available for consultation/collaboration.  Donnetta Hutching, MD 12/30/11 [phone]

## 2012-01-03 ENCOUNTER — Emergency Department (HOSPITAL_COMMUNITY): Payer: Federal, State, Local not specified - PPO

## 2012-01-03 ENCOUNTER — Encounter (HOSPITAL_COMMUNITY): Payer: Self-pay | Admitting: *Deleted

## 2012-01-03 ENCOUNTER — Emergency Department (HOSPITAL_COMMUNITY)
Admission: EM | Admit: 2012-01-03 | Discharge: 2012-01-03 | Disposition: A | Discharge status: Home / Self Care | Payer: Federal, State, Local not specified - PPO | Attending: Emergency Medicine | Admitting: Emergency Medicine

## 2012-01-03 DIAGNOSIS — R110 Nausea: Secondary | ICD-10-CM | POA: Insufficient documentation

## 2012-01-03 DIAGNOSIS — R51 Headache: Secondary | ICD-10-CM

## 2012-01-03 MED ORDER — KETOROLAC TROMETHAMINE 60 MG/2ML IM SOLN
60.0000 mg | Freq: Once | INTRAMUSCULAR | Status: AC
  Administered 2012-01-03: 60 mg via INTRAMUSCULAR
  Filled 2012-01-03: qty 2

## 2012-01-03 MED ORDER — PROPARACAINE HCL 0.5 % OP SOLN
OPHTHALMIC | Status: AC
  Filled 2012-01-03: qty 15

## 2012-01-03 MED ORDER — DICLOFENAC SODIUM 75 MG PO TBEC: 75.0000 mg | DELAYED_RELEASE_TABLET | Freq: Two times a day (BID) | ORAL | Status: DC

## 2012-01-03 MED ORDER — TETRACAINE HCL 0.5 % OP SOLN: 2.0000 [drp] | Freq: Once | OPHTHALMIC | Status: DC

## 2012-01-03 MED ORDER — DEXAMETHASONE SODIUM PHOSPHATE 10 MG/ML IJ SOLN
10.0000 mg | Freq: Once | INTRAMUSCULAR | Status: AC
  Administered 2012-01-03: 10 mg via INTRAMUSCULAR
  Filled 2012-01-03: qty 1
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
## 2012-01-04 NOTE — ED Provider Notes (Signed)
Medical screening examination/treatment/procedure(s) were performed by non-physician practitioner and as supervising physician I was immediately available for consultation/collaboration.   Donalda Job, MD 01/04/12 1456 

## 2012-01-09 ENCOUNTER — Emergency Department (HOSPITAL_COMMUNITY): Payer: Federal, State, Local not specified - PPO

## 2012-01-09 ENCOUNTER — Emergency Department (INDEPENDENT_AMBULATORY_CARE_PROVIDER_SITE_OTHER): Payer: Federal, State, Local not specified - PPO

## 2012-01-09 ENCOUNTER — Emergency Department (HOSPITAL_COMMUNITY)
Admission: EM | Admit: 2012-01-09 | Discharge: 2012-01-09 | Disposition: A | Discharge status: Home / Self Care | Payer: Federal, State, Local not specified - PPO | Attending: Emergency Medicine | Admitting: Emergency Medicine

## 2012-01-09 ENCOUNTER — Emergency Department (INDEPENDENT_AMBULATORY_CARE_PROVIDER_SITE_OTHER)
Admission: EM | Admit: 2012-01-09 | Discharge: 2012-01-09 | Disposition: A | Discharge status: Nursing Home (Includes ICF) | Payer: Federal, State, Local not specified - PPO | Source: Home / Self Care | Attending: Family Medicine | Admitting: Family Medicine

## 2012-01-09 ENCOUNTER — Encounter (HOSPITAL_COMMUNITY): Payer: Self-pay | Admitting: *Deleted

## 2012-01-09 DIAGNOSIS — Z86718 Personal history of other venous thrombosis and embolism: Secondary | ICD-10-CM | POA: Insufficient documentation

## 2012-01-09 DIAGNOSIS — R110 Nausea: Secondary | ICD-10-CM | POA: Insufficient documentation

## 2012-01-09 DIAGNOSIS — Z79899 Other long term (current) drug therapy: Secondary | ICD-10-CM | POA: Insufficient documentation

## 2012-01-09 DIAGNOSIS — R109 Unspecified abdominal pain: Secondary | ICD-10-CM

## 2012-01-09 DIAGNOSIS — J45909 Unspecified asthma, uncomplicated: Secondary | ICD-10-CM | POA: Insufficient documentation

## 2012-01-09 DIAGNOSIS — R1033 Periumbilical pain: Secondary | ICD-10-CM | POA: Insufficient documentation

## 2012-01-09 LAB — COMPREHENSIVE METABOLIC PANEL
ALT: 19 U/L (ref 0–35)
AST: 18 U/L (ref 0–37)
Albumin: 3.9 g/dL (ref 3.5–5.2)
BUN: 12 mg/dL (ref 6–23)
CO2: 24 mEq/L (ref 19–32)
Calcium: 9.9 mg/dL (ref 8.4–10.5)
Calcium: 10.1 mg/dL (ref 8.4–10.5)
Chloride: 102 mEq/L (ref 96–112)
Chloride: 103 mEq/L (ref 96–112)
Creatinine, Ser: 0.82 mg/dL (ref 0.50–1.10)
Creatinine, Ser: 0.83 mg/dL (ref 0.50–1.10)
GFR calc Af Amer: >90 mL/min (ref >90)
GFR calc Af Amer: >90 mL/min (ref >90)
GFR calc non Af Amer: 83 mL/min — ABNORMAL LOW (ref >90)
Glucose, Bld: 97 mg/dL (ref 70–99)
Sodium: 140 mEq/L (ref 135–145)
Total Bilirubin: 0.3 mg/dL (ref 0.3–1.2)
Total Bilirubin: 0.3 mg/dL (ref 0.3–1.2)
Total Protein: 7.7 g/dL (ref 6.0–8.3)
Total Protein: 7.8 g/dL (ref 6.0–8.3)

## 2012-01-09 LAB — LIPASE, BLOOD
Lipase: 18 U/L (ref 11–59)
Lipase: 19 U/L (ref 11–59)

## 2012-01-09 LAB — URINALYSIS, ROUTINE W REFLEX MICROSCOPIC
Ketones, ur: 40 mg/dL — AB
Nitrite: NEGATIVE
Protein, ur: NEGATIVE mg/dL

## 2012-01-09 LAB — POCT PREGNANCY, URINE
Preg Test, Ur: NEGATIVE
Preg Test, Ur: NEGATIVE

## 2012-01-09 LAB — POCT URINALYSIS DIP (DEVICE)
Glucose, UA: NEGATIVE mg/dL
Ketones, ur: 40 mg/dL — AB
pH: 6 (ref 5.0–8.0)

## 2012-01-09 LAB — CBC
HCT: 39.7 % (ref 36.0–46.0)
Hemoglobin: 13.3 g/dL (ref 12.0–15.0)
MCH: 28.8 pg (ref 26.0–34.0)
MCHC: 34.3 g/dL (ref 30.0–36.0)
MCV: 84 fL (ref 78.0–100.0)
Platelets: 292 10*3/uL (ref 150–400)
Platelets: 293 10*3/uL (ref 150–400)
RDW: 12.8 % (ref 11.5–15.5)
RDW: 12.7 % (ref 11.5–15.5)

## 2012-01-09 LAB — DIFFERENTIAL
Basophils Absolute: 0 10*3/uL (ref 0.0–0.1)
Basophils Absolute: 0 10*3/uL (ref 0.0–0.1)
Basophils Relative: 0 % (ref 0–1)
Eosinophils Absolute: 0 10*3/uL (ref 0.0–0.7)
Eosinophils Absolute: 0 10*3/uL (ref 0.0–0.7)
Eosinophils Relative: 0 % (ref 0–5)
Lymphocytes Relative: 18 % (ref 12–46)
Lymphs Abs: 2 10*3/uL (ref 0.7–4.0)
Monocytes Absolute: 0.6 10*3/uL (ref 0.1–1.0)
Monocytes Absolute: 0.7 10*3/uL (ref 0.1–1.0)
Monocytes Relative: 5 % (ref 3–12)
Neutro Abs: 9.8 10*3/uL — ABNORMAL HIGH (ref 1.7–7.7)

## 2012-01-09 LAB — URINE MICROSCOPIC-ADD ON

## 2012-01-09 NOTE — ED Notes (Signed)
Patient transported to Ultrasound 

## 2012-01-09 NOTE — ED Notes (Signed)
The pt has not had a bm for one week and she has had abd pain since then.  She took a laxative Saturday but had no results.  She is a transfer from [facility]

## 2012-01-09 NOTE — ED Notes (Signed)
C/o constipated Friday, took 3 dulcolax Saturday & had BM Sat & Sun. C/o RUQ pain, constant since Sunday, nausea, no emesis, decreased appetite, denies any flatus, & no BM since then.

## 2012-01-09 NOTE — ED Provider Notes (Signed)
History     CSN: [id number]  Arrival date & time 01/09/12  1310   First MD Initiated Contact with Patient 01/09/12 1341      Chief Complaint  Patient presents with  . Abdominal Pain    (Consider location/radiation/quality/duration/timing/severity/associated sxs/prior treatment) Patient is a 46 y.o. female presenting with abdominal pain. The history is provided by the patient. No language interpreter was used.  Abdominal Pain The primary symptoms of the illness include abdominal pain and nausea. The current episode started more than 2 days ago. The onset of the illness was gradual. The problem has been gradually worsening.  The illness is associated with laxative use. The patient states that she believes she is currently not pregnant. The patient has had a change in bowel habit. Symptoms associated with the illness do not include chills. Significant associated medical issues do not include PUD or GERD.  Pt reports she has pain in the upper abdominal area.  Pt took a laxative and had multiple bowel movements.  Past Medical History  Diagnosis Date  . Pernicious anemia 10/30/2011  . Lumbar herniated disc   . Environmental allergies   . Angioedema   . DVT (deep venous thrombosis)   . Asthma   . Seasonal allergies     Past Surgical History  Procedure Date  . Hernia repair     Family History  Problem Relation Age of Onset  . Cancer Mother   . COPD Father   . Asthma Brother   . Cancer Brother     History  Substance Use Topics  . Smoking status: Never Smoker   . Smokeless tobacco: Not on file  . Alcohol Use: Yes     Occasional    OB History    Grav Para Term Preterm Abortions TAB SAB Ect Mult Living                  Review of Systems  Constitutional: Negative for chills.  Gastrointestinal: Positive for nausea and abdominal pain.  All other systems reviewed and are negative.    Allergies  Amoxil  Home Medications   Current Outpatient Rx  Name Route Sig  Dispense Refill  . PROAIR HFA IN Inhalation Inhale 1-2 puffs into the lungs every 4 (four) hours as needed. Shortness of breath or wheezing    . ALPRAZOLAM 0.5 MG PO TABS Oral Take 0.5 mg by mouth 3 (three) times daily as needed. anxiety    . DESLORATADINE 5 MG PO TABS Oral Take 1 tablet (5 mg total) by mouth daily. 30 tablet 0  . ESCITALOPRAM OXALATE 20 MG PO TABS Oral Take 20 mg by mouth daily.      [name] FLUTICASONE PROPIONATE 50 MCG/ACT NA SUSP Nasal Place 2 sprays into the nose daily as needed. Taking for upper respiratory problem as needed. Prescribed daily.    [name] FLUTICASONE-SALMETEROL 100-50 MCG/DOSE IN AEPB Inhalation Inhale 1 puff into the lungs every 12 (twelve) hours.      [name] GABAPENTIN 300 MG PO CAPS Oral Take 300 mg by mouth 3 (three) times daily.     [name] HYDROCODONE-ACETAMINOPHEN 5-500 MG PO TABS Oral Take 1 tablet by mouth every 6 (six) hours as needed.     . IBUPROFEN 600 MG PO TABS Oral Take 600 mg by mouth every 6 (six) hours as needed.    [name] LEVOCETIRIZINE DIHYDROCHLORIDE 5 MG PO TABS Oral Take 1 tablet (5 mg total) by mouth every evening. 30 tablet 0  . MONTELUKAST SODIUM 10  MG PO TABS Oral Take 10 mg by mouth at bedtime.      . ALEVE PO Oral Take 1 tablet by mouth every 6 (six) hours as needed. For pain    . CYANOCOBALAMIN 1000 MCG/ML IJ SOLN Intramuscular Inject 1,000 mcg into the muscle every 3 (three) months.     [name] DIAZEPAM 5 MG PO TABS Oral Take 5 mg by mouth daily before breakfast. Instructed to take from 12/29/11 - 01/08/12 only.    [name] DICLOFENAC SODIUM 75 MG PO TBEC Oral Take 1 tablet (75 mg total) by mouth 2 (two) times daily. 30 tablet 0  . DIPHENHYDRAMINE HCL 25 MG PO CAPS Oral Take 25 mg by mouth at bedtime as needed. Chronic illness, itching    . EPINEPHRINE 0.3 MG/0.3ML IJ DEVI Intramuscular Inject 0.3 mg into the muscle once.      . IBUPROFEN 600 MG PO TABS Oral Take 600 mg by mouth daily before breakfast. Instructed to take from 12/29/11 - 01/08/12 only.    .  OXYCODONE-ACETAMINOPHEN 5-325 MG PO TABS Oral Take 1 tablet by mouth daily before breakfast. Instructed to take from 12/29/11 - 01/08/12 only.      BP 125/86  Pulse 96  Temp(Src) 98.5 F (36.9 C) (Oral)  Resp 16  SpO2 97%  LMP 12/04/2011  Physical Exam  Nursing note and vitals reviewed. Constitutional: She is oriented to person, place, and time. She appears well-developed and well-nourished.  HENT:  Head: Normocephalic and atraumatic.  Right Ear: External ear normal.  Left Ear: External ear normal.  Nose: Nose normal.  Mouth/Throat: Oropharynx is clear and moist.  Eyes: Conjunctivae are normal. Pupils are equal, round, and reactive to light.  Neck: Normal range of motion. Neck supple.  Cardiovascular: Normal rate and normal heart sounds.   Pulmonary/Chest: Effort normal.  Abdominal: Soft.  Musculoskeletal: Normal range of motion.  Neurological: She is alert and oriented to person, place, and time. She has normal reflexes.  Skin: Skin is warm.  Psychiatric: She has a normal mood and affect.    ED Course  Procedures (including critical care time)  Labs Reviewed  CBC - Abnormal; Notable for the following:    WBC 11.2 (*)    All other components within normal limits  DIFFERENTIAL - Abnormal; Notable for the following:    Neutro Abs 8.6 (*)    All other components within normal limits  COMPREHENSIVE METABOLIC PANEL - Abnormal; Notable for the following:    GFR calc non Af Amer 84 (*)    All other components within normal limits  POCT URINALYSIS DIP (DEVICE) - Abnormal; Notable for the following:    Bilirubin Urine MODERATE (*)    Ketones, ur 40 (*)    Hgb urine dipstick MODERATE (*)    Protein, ur 30 (*)    Leukocytes, UA SMALL (*) Biochemical Testing Only. Please order routine urinalysis from main lab if confirmatory testing is needed.   All other components within normal limits  LIPASE, BLOOD  POCT PREGNANCY, URINE  POCT PREGNANCY, URINE  POCT URINALYSIS DIPSTICK    POCT PREGNANCY, URINE   Dg Abd 1 View  01/09/2012  *RADIOLOGY REPORT*  Clinical Data: Abdominal pain.  ABDOMEN - 1 VIEW  Comparison: Lumbar spine images 12/29/2011.  Findings: There is a nonobstructive bowel gas pattern.  No supine evidence of free air.  No organomegaly or suspicious calcification.  No acute bony abnormality.  Leftward scoliosis of the lumbar spine, stable.  IMPRESSION: No acute findings.  Original Report Authenticated By: Cyndie Chime, M.D.     No diagnosis found.    MDM  Pt has elevated wbc count.  Pt tender in epigastric and upper abdominal area.  I am concerned about gallbladder disease.  Pt is not eating or drinking.  Pt has 40 ketones in urine,  Consistent with dehydration.       Langston Masker, [place] 01/09/12 1606

## 2012-01-09 NOTE — ED Provider Notes (Signed)
History     CSN: [id number]  Arrival date & time 01/09/12  1637   First MD Initiated Contact with Patient 01/09/12 1836      Chief Complaint  Patient presents with  . Abdominal Pain    (Consider location/radiation/quality/duration/timing/severity/associated sxs/prior treatment) Patient is a 46 y.o. female presenting with abdominal pain. The history is provided by the patient. No language interpreter was used.  Abdominal Pain The primary symptoms of the illness include abdominal pain (3-4 days) and nausea (yesterday). The primary symptoms of the illness do not include fever, shortness of breath, vomiting or diarrhea. The current episode started more than 2 days ago. The onset of the illness was gradual. The problem has not changed since onset. The abdominal pain began more than 2 days ago. The pain came on gradually. The abdominal pain has been unchanged since its onset. The abdominal pain is located in the periumbilical region. The abdominal pain does not radiate. The abdominal pain is relieved by nothing.  The patient states that she believes she is currently not pregnant. The patient has not had a change in bowel habit. Symptoms associated with the illness do not include chills.    Past Medical History  Diagnosis Date  . Pernicious anemia 10/30/2011  . Lumbar herniated disc   . Environmental allergies   . Angioedema   . DVT (deep venous thrombosis)   . Asthma   . Seasonal allergies     Past Surgical History  Procedure Date  . Hernia repair     Family History  Problem Relation Age of Onset  . Cancer Mother   . COPD Father   . Asthma Brother   . Cancer Brother     History  Substance Use Topics  . Smoking status: Never Smoker   . Smokeless tobacco: Not on file  . Alcohol Use: Yes     Occasional    OB History    Grav Para Term Preterm Abortions TAB SAB Ect Mult Living                  Review of Systems  Constitutional: Negative for fever and chills.    Respiratory: Negative for cough and shortness of breath.   Gastrointestinal: Positive for nausea (yesterday) and abdominal pain (3-4 days). Negative for vomiting and diarrhea.  All other systems reviewed and are negative.    Allergies  Amoxicillin and Amoxil  Home Medications   Current Outpatient Rx  Name Route Sig Dispense Refill  . PROAIR HFA IN Inhalation Inhale 1-2 puffs into the lungs every 4 (four) hours as needed. Shortness of breath or wheezing    . ALPRAZOLAM 0.5 MG PO TABS Oral Take 0.5 mg by mouth 3 (three) times daily as needed. anxiety    . CYANOCOBALAMIN 1000 MCG/ML IJ SOLN Intramuscular Inject 1,000 mcg into the muscle every 3 (three) months.     . DESLORATADINE 5 MG PO TABS Oral Take 1 tablet (5 mg total) by mouth daily. 30 tablet 0  . DIPHENHYDRAMINE HCL 25 MG PO CAPS Oral Take 25 mg by mouth at bedtime as needed. Chronic illness, itching    . EPINEPHRINE 0.3 MG/0.3ML IJ DEVI Intramuscular Inject 0.3 mg into the muscle once as needed. For emergency in work related allergies    . ESCITALOPRAM OXALATE 20 MG PO TABS Oral Take 20 mg by mouth daily.      [name] FLUTICASONE PROPIONATE 50 MCG/ACT NA SUSP Nasal Place 2 sprays into the nose daily as needed. For  allergy    . FLUTICASONE-SALMETEROL 250-50 MCG/DOSE IN AEPB Inhalation Inhale 1 puff into the lungs every 12 (twelve) hours as needed. For wheezing    . GABAPENTIN 300 MG PO CAPS Oral Take 300 mg by mouth 3 (three) times daily.     [name] HYDROCODONE-ACETAMINOPHEN 5-500 MG PO TABS Oral Take 1 tablet by mouth every 6 (six) hours as needed. For pain    . IBUPROFEN 600 MG PO TABS Oral Take 600 mg by mouth every 6 (six) hours as needed. For pain    . LEVOCETIRIZINE DIHYDROCHLORIDE 5 MG PO TABS Oral Take 1 tablet (5 mg total) by mouth every evening. 30 tablet 0  . MONTELUKAST SODIUM 10 MG PO TABS Oral Take 10 mg by mouth at bedtime.      [name] DIAZEPAM 5 MG PO TABS Oral Take 5 mg by mouth daily before breakfast. Instructed to take from  12/29/11 - 01/08/12 only.    . IBUPROFEN 600 MG PO TABS Oral Take 600 mg by mouth daily before breakfast. Instructed to take from 12/29/11 - 01/08/12 only.    . OXYCODONE-ACETAMINOPHEN 5-325 MG PO TABS Oral Take 1 tablet by mouth daily before breakfast. Instructed to take from 12/29/11 - 01/08/12 only.      BP 152/91  Pulse 111  Temp(Src) 98.1 F (36.7 C) (Oral)  Resp 20  SpO2 98%  LMP 12/04/2011  Physical Exam  Nursing note and vitals reviewed. Constitutional: She is oriented to person, place, and time. She appears well-developed and well-nourished. No distress.  HENT:  Head: Normocephalic and atraumatic.  Eyes: EOM are normal. Pupils are equal, round, and reactive to light.  Neck: Normal range of motion. Neck supple.  Cardiovascular: Normal rate and regular rhythm.  Exam reveals no friction rub.   No murmur heard. Pulmonary/Chest: Effort normal and breath sounds normal. No respiratory distress. She has no wheezes. She has no rales.  Abdominal: Soft. She exhibits no distension. There is tenderness (Mild, central). There is no rebound.  Musculoskeletal: Normal range of motion. She exhibits no edema.  Neurological: She is alert and oriented to person, place, and time.  Skin: She is not diaphoretic.    ED Course  Procedures (including critical care time)  Labs Reviewed  URINALYSIS, ROUTINE W REFLEX MICROSCOPIC - Abnormal; Notable for the following:    APPearance HAZY (*)    Hgb urine dipstick MODERATE (*)    Bilirubin Urine SMALL (*)    Ketones, ur 40 (*)    Leukocytes, UA MODERATE (*)    All other components within normal limits  CBC - Abnormal; Notable for the following:    WBC 12.8 (*)    All other components within normal limits  DIFFERENTIAL - Abnormal; Notable for the following:    Neutro Abs 9.8 (*)    All other components within normal limits  COMPREHENSIVE METABOLIC PANEL - Abnormal; Notable for the following:    GFR calc non Af Amer 83 (*)    All other components within  normal limits  URINE MICROSCOPIC-ADD ON - Abnormal; Notable for the following:    Squamous Epithelial / LPF FEW (*)    All other components within normal limits  LIPASE, BLOOD  POCT PREGNANCY, URINE  POCT PREGNANCY, URINE   Dg Abd 1 View  01/09/2012  *RADIOLOGY REPORT*  Clinical Data: Abdominal pain.  ABDOMEN - 1 VIEW  Comparison: Lumbar spine images 12/29/2011.  Findings: There is a nonobstructive bowel gas pattern.  No supine evidence of free air.  No organomegaly or suspicious calcification.  No acute bony abnormality.  Leftward scoliosis of the lumbar spine, stable.  IMPRESSION: No acute findings.  Original Report Authenticated By: Cyndie Chime, M.D.     1. Abdominal pain       MDM  46 year old female presents with abdominal pain. Present for the past 3-4 days. Recent constipation with relief using Dulcolax, however no bowel movement in 3 days. Multiple episodes of nausea yesterday, none today. No vomiting. No recent diarrhea. Denies fevers. No chest pain or shortness of breath. Patient was seen at outside hospital earlier today with her system where an x-ray showed a nonobstructive bowel gas pattern. She denies any urinary/vaginal complaints. Patient's vitals are stable here. Patient has mild abdominal tenderness without any rebound or guarding. No localizing tenderness and abdomen to any specific quadrant. No CVA tenderness. Labs show contaminated urine sample. Liver function tests normal. Cholesterol is normal. Ultrasound of abdomen unremarkable. We'll not treat contaminated UTI since she does not have any urinary symptoms. Patient stable for discharge.      Elwin Mocha, MD 01/09/12 2240

## 2012-01-09 NOTE — ED Notes (Signed)
Onset 2 days ago epigastric pain.  She has been constipated lately and she took 3 dulcolax tabs  01/06/2012. which gave her good results.  She is still having the ABD  Dull, constant pain from that.  She aslo had lower mid T-spine pain.  S/P L-5, S-1 herniated  disc

## 2012-01-10 LAB — POCT PREGNANCY, URINE: Preg Test, Ur: NEGATIVE

## 2012-01-10 NOTE — ED Provider Notes (Signed)
I saw and evaluated the patient, reviewed the resident's note and I agree with the findings and plan. Sent over from urgent care with upper abdominal pain. Mild leukocytosis. No UTI sx. Min epigastric/RUQ ttp. U/S negative for cholecystitis. Patient feeling better. Abd benign on repeat exam. Urine contaminated- sent for culture. DC home with PMD follow up, precautions for return.   Forbes Cellar, MD 01/10/12 [phone]

## 2012-01-10 NOTE — ED Notes (Signed)
I called [facility] in POC to cancel one of the neg. U-preg tests ( 1501 and 1504), so pt. does not get charged for both. She said she would take care of it. Vassie Moselle 01/10/2012

## 2012-01-11 NOTE — ED Provider Notes (Signed)
Medical screening examination/treatment/procedure(s) were performed by non-physician practitioner and as supervising physician I was immediately available for consultation/collaboration.   Barkley Bruns MD.    Barkley Bruns, MD 01/11/12 [phone]

## 2012-01-29 ENCOUNTER — Telehealth: Payer: Self-pay | Admitting: Hematology & Oncology

## 2012-01-29 NOTE — Telephone Encounter (Signed)
Pt had orders for 2-4 lab and inj she scheduled it for 2-13

## 2012-01-29 NOTE — Telephone Encounter (Signed)
Pt left message wanting B12 and Lab appointments. I called her back and left a message

## 2012-02-07 ENCOUNTER — Ambulatory Visit (HOSPITAL_BASED_OUTPATIENT_CLINIC_OR_DEPARTMENT_OTHER): Payer: Federal, State, Local not specified - PPO

## 2012-02-07 ENCOUNTER — Other Ambulatory Visit: Payer: Self-pay | Admitting: *Deleted

## 2012-02-07 ENCOUNTER — Other Ambulatory Visit (HOSPITAL_BASED_OUTPATIENT_CLINIC_OR_DEPARTMENT_OTHER): Payer: Federal, State, Local not specified - PPO | Admitting: Lab

## 2012-02-07 VITALS — BP 119/78 | HR 74 | Temp 97.6°F

## 2012-02-07 DIAGNOSIS — D510 Vitamin B12 deficiency anemia due to intrinsic factor deficiency: Secondary | ICD-10-CM

## 2012-02-07 LAB — CBC WITH DIFFERENTIAL (CANCER CENTER ONLY)
BASO%: 0 % (ref 0.0–2.0)
HGB: 12.4 g/dL (ref 11.6–15.9)
LYMPH%: 43.7 % (ref 14.0–48.0)
MCH: 28.4 pg (ref 26.0–34.0)
MCHC: 33.3 g/dL (ref 32.0–36.0)
MCV: 85 fL (ref 81–101)
NEUT#: 3.1 10*3/uL (ref 1.5–6.5)
Platelets: 262 10*3/uL (ref 145–400)
RDW: 12.8 % (ref 11.1–15.7)

## 2012-02-07 LAB — IRON AND TIBC
%SAT: 20 % (ref 20–55)
Iron: 64 ug/dL (ref 42–145)
TIBC: 324 ug/dL (ref 250–470)
UIBC: 260 ug/dL (ref 125–400)

## 2012-02-07 MED ORDER — CYANOCOBALAMIN 1000 MCG/ML IJ SOLN
1000.0000 ug | Freq: Once | INTRAMUSCULAR | Status: AC
  Administered 2012-02-07: 1000 ug via INTRAMUSCULAR

## 2012-02-07 NOTE — Progress Notes (Signed)
Pt here for B-12 injection. To receive it as scheduled q16months. Supportive therapy plan entered. Onc tx schedule request sent along with Prior Authorization check.

## 2012-02-13 ENCOUNTER — Encounter: Payer: Self-pay | Admitting: *Deleted

## 2012-02-13 NOTE — Progress Notes (Signed)
Sherri, [name] - 02/07/12 More Detail >>      Josph Macho, MD        Sent: Sat February 10, 2012 11:59 AM    To: Filiberto Pinks Nurse Hp          Result Note     acll- labs look great!!pete     Attached to     CBC WITH DIFFERENTIAL ([facility]) (Order# [id number]); FERRITIN (Order# [id number]); VITAMIN B12 (Order# [id number]); IRON AND TIBC (Order# [id number])         CBC with Differential ([facility])      Status: Final result   MyChart: Not Released   Next appt with me: None        Notes Recorded by Josph Macho, MD on 02/10/2012 at 11:59 AM acll- labs look great!!pete        Range    6d ago (02/07/12)    68mo ago (01/09/12)    68mo ago (01/09/12)    68mo ago (01/09/12)    68mo ago (01/09/12)    77mo ago (10/30/11)    76mo ago (08/02/11)        WBC 3.9 - 10.0 10e3/uL 6.2    12.8 (H)R      11.2 (H)R      7.4   7.1       RBC 3.70 - 5.32 10e6/uL 4.37    4.74R      4.62R      4.41   4.14       HGB 11.6 - 15.9 g/dL 62.9    52.8U      13.2G      12.8   12.0       HCT 34.8 - 46.6 % 37.2    39.7R      38.8R      37.2   35.1       MCV 81 - 101 fL 85    83.8R      84.0R      84   85       MCH 26.0 - 34.0 pg 28.4    28.7      28.8      29.0   29.0       MCHC 32.0 - 36.0 g/dL 40.1    02.7O      53.6U      34.4   34.2       RDW 11.1 - 15.7 % 12.8    12.7R      12.8R      13.0   12.8       Platelets 145 - 400 10e3/uL 262    293R      292R      279   286       NEUT# 1.5 - 6.5 10e3/uL 3.1                4.2   4.8       LYMPH# 0.9 - 3.3 10e3/uL 2.7                2.7   1.9       MONO# 0.1 - 0.9 10e3/uL 0.4                0.5   0.4       Eosinophils Absolute 0.0 - 0.5 10e3/uL 0.0       0.0R      0.0R   0.0   0.0  BASO# 0.0 - 0.2 10e3/uL 0.0                0.0   0.0       NEUT% 39.6 - 80.0 % 50.0                56.3   67.5       LYMPH% 14.0 - 48.0 % 43.7                36.4   27.0       MONO% 0.0 - 13.0 % 6.3                7.3   5.5       EOS% 0.0 - 7.0 % 0.0                 0.0   0.0       BASO% 0.0 - 2.0 % 0.0                0.0   0.0      Resulting Agency                    Lab Flowsheet  Result Narrative         Performed At:  [facility]               [address]               [place], [place] [address]         Order Details View Encounter Lab and Collection Details Routing Result History      Specimen Collected: 02/07/12 12:04 PM Last Resulted: 02/07/12 12:12 PM        R=Reference range differs from displayed range          Ferritin      Status: Final result   MyChart: Not Released   Next appt with me: None        Notes Recorded by Josph Macho, MD on 02/10/2012 at 11:59 AM acll- labs look great!!pete        Range    6d ago (02/07/12)    18mo ago (10/30/11)    11mo ago (08/02/11)    11mo ago (08/02/11)    11mo ago (08/02/11)    11mo ago (08/02/11)    11mo ago (08/02/11)        Ferritin 10 - 291 ng/mL 153    193   144   144   144   144   144      Resulting Agency                    Lab Flowsheet  Result Narrative         Performed at:  [facility]               [address]               [place], [place] [address]         Order Details [employer] Lab and Collection Details Routing Result History      Specimen Collected: 02/07/12 12:04 PM Last Resulted: 02/07/12  3:48 PM            Vitamin B12      Status: Final result   MyChart: Not Released   Next appt with me: None  Notes Recorded by Josph Macho, MD on 02/10/2012 at 11:59 AM acll- labs look great!!pete        Range    6d ago (02/07/12)    281mo ago (10/30/11)    48mo ago (08/02/11)    48mo ago (08/02/11)    48mo ago (08/02/11)    48mo ago (08/02/11)    48mo ago (08/02/11)        Vitamin B-12 211 - 911 pg/mL 541    552   561   561   561   561   561      Resulting Agency                    Lab Flowsheet  Result Narrative         Performed at:  [facility]                [address]               [place], [place] [address]         Order [employer] Lab and Collection Details Routing Result History      Specimen Collected: 02/07/12 12:04 PM Last Resulted: 02/07/12  3:48 PM            Iron and TIBC      Status: Final result   MyChart: Not Released   Next appt with me: None        Notes Recorded by Josph Macho, MD on 02/10/2012 at 11:59 AM acll- labs look great!!pete        Range    6d ago (02/07/12)    281mo ago (10/30/11)    281mo ago (10/30/11)    48mo ago (08/02/11)    48mo ago (08/02/11)    48mo ago (08/02/11)    48mo ago (08/02/11)    48mo ago (08/02/11)    48mo ago (08/02/11)        Iron 42 - 145 ug/dL 64    68   68   540   981   117   117   117   117       UIBC 125 - 400 ug/dL 191    478   295   621H   198R   198R   198R   198R   198R       TIBC 250 - 470 ug/dL 086    578   469   629   315   315   315   315   315       %SAT 20 - 55 % 20    21   21    37   37   37   37   37   37      Resulting Agency                        Lab Flowsheet  Result Narrative         Performed at:  [facility]               [address]               [place], [place] [address]         Order Details [employer] [facility] Lab and Collection Details Routing Result History      Specimen Collected: 02/07/12 12:04 PM Last Resulted:  02/07/12  3:48 PM        R=Reference range differs from displayed range             Sherri, [name] - 02/07/12 More Detail >>      Josph Macho, MD        Sent: Sat February 10, 2012 11:59 AM    To: Filiberto Pinks Nurse Hp          Result Note     acll- labs look great!!pete     Attached to     CBC WITH DIFFERENTIAL ([facility]) (Order# [id number]); FERRITIN (Order# [id number]); VITAMIN B12 (Order# [id number]); IRON AND TIBC (Order# [id number])         CBC with Differential ([facility])      Status: Final result   MyChart: Not Released   Next appt with me: None         Notes Recorded by Josph Macho, MD on 02/10/2012 at 11:59 AM acll- labs look great!!pete        Range    6d ago (02/07/12)    62mo ago (01/09/12)    62mo ago (01/09/12)    62mo ago (01/09/12)    62mo ago (01/09/12)    61mo ago (10/30/11)    56mo ago (08/02/11)        WBC 3.9 - 10.0 10e3/uL 6.2    12.8 (H)R      11.2 (H)R      7.4   7.1       RBC 3.70 - 5.32 10e6/uL 4.37    4.74R      4.62R      4.41   4.14       HGB 11.6 - 15.9 g/dL 36.6    44.0H      47.4Q      12.8   12.0       HCT 34.8 - 46.6 % 37.2    39.7R      38.8R      37.2   35.1       MCV 81 - 101 fL 85    83.8R      84.0R      84   85       MCH 26.0 - 34.0 pg 28.4    28.7      28.8      29.0   29.0       MCHC 32.0 - 36.0 g/dL 59.5    63.8V      56.4P      34.4   34.2       RDW 11.1 - 15.7 % 12.8    12.7R      12.8R      13.0   12.8       Platelets 145 - 400 10e3/uL 262    293R      292R      279   286       NEUT# 1.5 - 6.5 10e3/uL 3.1                4.2   4.8       LYMPH# 0.9 - 3.3 10e3/uL 2.7                2.7   1.9       MONO# 0.1 - 0.9 10e3/uL 0.4                0.5   0.4  Eosinophils Absolute 0.0 - 0.5 10e3/uL 0.0       0.0R      0.0R   0.0   0.0       BASO# 0.0 - 0.2 10e3/uL 0.0                0.0   0.0       NEUT% 39.6 - 80.0 % 50.0                56.3   67.5       LYMPH% 14.0 - 48.0 % 43.7                36.4   27.0       MONO% 0.0 - 13.0 % 6.3                7.3   5.5       EOS% 0.0 - 7.0 % 0.0                0.0   0.0       BASO% 0.0 - 2.0 % 0.0                0.0   0.0      Resulting Agency                    Lab Flowsheet  Result Narrative         Performed At:  [facility]               [address]               [place], [place] [address]         Order Details View Encounter Lab and Collection Details Routing Result History      Specimen Collected: 02/07/12 12:04 PM Last Resulted: 02/07/12 12:12 PM        R=Reference range differs from displayed range            Ferritin      Status: Final result   MyChart: Not Released   Next appt with me: None        Notes Recorded by Josph Macho, MD on 02/10/2012 at 11:59 AM acll- labs look great!!pete        Range    6d ago (02/07/12)    465mo ago (10/30/11)    65mo ago (08/02/11)    65mo ago (08/02/11)    65mo ago (08/02/11)    65mo ago (08/02/11)    65mo ago (08/02/11)        Ferritin 10 - 291 ng/mL 153    193   144   144   144   144   144      Resulting Agency                    Lab Flowsheet  Result Narrative         Performed at:  [facility]               [address]               [place], [place] [address]         Order [facility] Lab and Collection Details Routing Result History      Specimen Collected: 02/07/12 12:04 PM Last Resulted: 02/07/12  3:48 PM  Vitamin B12      Status: Final result   MyChart: Not Released   Next appt with me: None        Notes Recorded by Josph Macho, MD on 02/10/2012 at 11:59 AM acll- labs look great!!pete        Range    6d ago (02/07/12)    71mo ago (10/30/11)    88mo ago (08/02/11)    88mo ago (08/02/11)    88mo ago (08/02/11)    88mo ago (08/02/11)    88mo ago (08/02/11)        Vitamin B-12 211 - 911 pg/mL 541    552   561   561   561   561   561      Resulting Agency                    Lab Flowsheet  Result Narrative         Performed at:  [facility]               [address]               [place], [place] [address]         Order [facility] Lab and Collection Details Routing Result History      Specimen Collected: 02/07/12 12:04 PM Last Resulted: 02/07/12  3:48 PM            Iron and TIBC      Status: Final result   MyChart: Not Released   Next appt with me: None        Notes Recorded by Josph Macho, MD on 02/10/2012 at 11:59 AM acll- labs look great!!pete        Range    6d ago (02/07/12)    71mo ago (10/30/11)    71mo  ago (10/30/11)    88mo ago (08/02/11)    88mo ago (08/02/11)    88mo ago (08/02/11)    88mo ago (08/02/11)    88mo ago (08/02/11)    88mo ago (08/02/11)        Iron 42 - 145 ug/dL 64    68   68   540   981   117   117   117   117       UIBC 125 - 400 ug/dL 191    478   295   621H   198R   198R   198R   198R   198R       TIBC 250 - 470 ug/dL 086    578   469   629   315   315   315   315   315       %SAT 20 - 55 % 20    21   21    37   37   37   37   37   37      Resulting Agency                        Lab Flowsheet  Result Narrative         Performed at:  [facility]               [address]               [place], [place] [address]  Order Details View Encounter Lab and Collection Details Routing Result History      Specimen Collected: 02/07/12 12:04 PM Last Resulted: 02/07/12  3:48 PM        R=Reference range differs from displayed range           Per Dr. Myna Hidalgo, pt called, message left on home answering machine that labs from 02/07/12 were great.

## 2012-03-13 ENCOUNTER — Telehealth: Payer: Self-pay | Admitting: Internal Medicine

## 2012-03-13 NOTE — Telephone Encounter (Signed)
No openings today - CDY is half-day today.  LMOM informing patient of this.  Patient has appt with CDY 3.21.13 @ 2pm.  Due to the late hour, unsure if patient can be worked-in.  Dr Maple Hudson please advise, thanks.

## 2012-03-13 NOTE — Telephone Encounter (Signed)
Pt unable to be worked in today.  Will await pt's call back to inform her of this.

## 2012-03-13 NOTE — Telephone Encounter (Signed)
Advised pt that she is unable to be worked in today.  Pt stated her understanding & will see CY as scheduled & that nothing further is needed.  Antionette Fairy

## 2012-03-14 ENCOUNTER — Encounter: Payer: Self-pay | Admitting: Internal Medicine

## 2012-03-14 ENCOUNTER — Ambulatory Visit (INDEPENDENT_AMBULATORY_CARE_PROVIDER_SITE_OTHER): Payer: Self-pay | Admitting: Internal Medicine

## 2012-03-14 VITALS — BP 114/70 | HR 71 | Ht 63.0 in | Wt 155.4 lb

## 2012-03-14 DIAGNOSIS — J45909 Unspecified asthma, uncomplicated: Secondary | ICD-10-CM

## 2012-03-14 DIAGNOSIS — L509 Urticaria, unspecified: Secondary | ICD-10-CM

## 2012-03-14 MED ORDER — FLUTICASONE-SALMETEROL 250-50 MCG/DOSE IN AEPB: INHALATION_SPRAY | RESPIRATORY_TRACT | Status: DC

## 2012-03-14 MED ORDER — EPINEPHRINE 0.3 MG/0.3ML IJ DEVI: INTRAMUSCULAR | Status: DC

## 2012-03-14 NOTE — Patient Instructions (Signed)
Refilled Advair and Epipen  If you are stable, you may be able to use Advair just once daily. If there is any question, use it twice daily.  Please call as needed.

## 2012-03-14 NOTE — Progress Notes (Signed)
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 2 avoid exposure to paper dust and related products, she was working in labor and season and off as. 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 She admits she was very anxious walking in on the first day and that some symptoms might just be from nervousness. She felt some itching, wheeze and cough. She felt hoarse which worried her because she needs to sing in church. Sinuses are draining. She did have an upper respiratory infection this winter. Has never had seasonal pollen rhinitis before. It Medications reviewed-Flonase, Advair 250, Xyzal, Singulair, pro air, EpiPen (never needed).  ROS-see HPI Constitutional:   No-   weight loss, night sweats, fevers, chills, fatigue, lassitude. HEENT:   No-  headaches, difficulty swallowing, tooth/dental problems, sore throat,       No-  sneezing, itching, ear ache, nasal congestion,+ post nasal drip,  CV:  No-   chest pain, orthopnea, PND, swelling in lower extremities, anasarca, dizziness, palpitations Resp: No-   shortness of breath with exertion or at rest.              No-   productive cough,  No non-productive cough,  No- coughing up of blood.              No-   change in color of mucus.  + wheezing.   Skin: No-   rash or lesions. GI:  No- recognized  heartburn, indigestion, abdominal pain, nausea, vomiting,  GU: No-   dysuria,  MS:  No-   joint pain or swelling.  . Neuro-     nothing unusual Psych:  No- change in mood or affect. No depression or anxiety.  No memory loss.  OBJ- Physical Exam General- Alert, Oriented, Affect-appropriate, Distress- none acute Skin- rash-none, lesions- none, excoriation- none Lymphadenopathy- none Head- atraumatic            Eyes- Gross vision intact, PERRLA, conjunctivae and secretions clear        Ears- Hearing, canals-normal            Nose- Clear, no-Septal dev, mucus, polyps, erosion, perforation             Throat- Mallampati II , mucosa clear , drainage- none, tonsils- large, not obstructive Neck- flexible , trachea midline, no stridor , thyroid nl, carotid no bruit Chest - symmetrical excursion , unlabored           Heart/CV- RRR , no murmur , no gallop  , no rub, nl s1 s2                           - JVD- none , edema- none, stasis changes- none, varices- none           Lung- clear to P&A, wheeze- none, cough- none , dullness-none, rub- none           Chest wall-  Abd- Br/ Gen/ Rectal- Not done, not indicated Extrem- cyanosis- none, clubbing, none, atrophy- none, strength- nl Neuro- grossly intact to observation

## 2012-03-17 ENCOUNTER — Encounter: Payer: Self-pay | Admitting: Internal Medicine

## 2012-03-17 NOTE — Assessment & Plan Note (Signed)
Mild nonspecific itching described, but no visible rash. Plan-antihistamines as needed.

## 2012-03-17 NOTE — Assessment & Plan Note (Signed)
No objective findings now. It is too early to say whether repeated exposure to the paper dust and aches in the postop as will be a problem again. She continues her medications in anticipation. Her chest symptoms could easily be the result of stress- induced mild reflux, unrelated to any direct effect of the environment otherwise.

## 2012-03-19 ENCOUNTER — Telehealth: Payer: Self-pay | Admitting: Internal Medicine

## 2012-03-19 NOTE — Telephone Encounter (Signed)
Spoke with pt. She states that she is expecting forms to be sent to CDY regarding worker's comp case and she wanted him to have her case ID number and date of injury. These are listed in initial msg. Will forward to CDY as FYI.

## 2012-03-20 ENCOUNTER — Telehealth: Payer: Self-pay | Admitting: Internal Medicine

## 2012-03-20 NOTE — Telephone Encounter (Signed)
Returning Sherri [name]'s call says she got disconnected can be reached at [phone].Sherri [name]

## 2012-03-20 NOTE — Telephone Encounter (Signed)
Spoke with pt. She states that she does not know any alternatives to advair, she is going to speak with her Water engineer and see what is needed here and call back if needed. I advised will leave a couple of samples up front to help in the meantime. Pt states nothing further is needed.

## 2012-03-20 NOTE — Telephone Encounter (Signed)
PA request received from [employer] [facility] for Advair 250/50. I spoke with Worker's Comp and they informed me that the Advair does not fall under this. The number I called was [phone]. Member # for [facility] [id number]. Pam notified at [employer] [facility] [employer] and she will let the patient know that they will need to file this under her regular insurance. Nothing further is needed from our office at this time.

## 2012-03-20 NOTE — Telephone Encounter (Signed)
Called pt and she states that she can not get worker's comp to cover the advair and needs alternative med. I asked if they gave her any alternatives, and her phone started breaking up. Was unable to hear pt at all. Will wait for her to call back.

## 2012-03-21 ENCOUNTER — Telehealth: Payer: Self-pay | Admitting: Internal Medicine

## 2012-03-21 NOTE — Telephone Encounter (Signed)
Spoke with pt. She states that at last visit CDY asked her if she needs to be taken out of work, and at that time she declined this, but now feels that she needs to be taken out until they can find her a position such as office work only. She wants CDY to write letter for to help get the ball rolling. If and when letter done she wants to come to the office and pick this up. Please advise, thanks!

## 2012-03-25 ENCOUNTER — Telehealth: Payer: Self-pay

## 2012-03-25 NOTE — Telephone Encounter (Signed)
Letter dictated

## 2012-03-25 NOTE — Telephone Encounter (Signed)
lmomtcb x1 

## 2012-03-25 NOTE — Telephone Encounter (Signed)
Please call back on # [phone]

## 2012-03-25 NOTE — Telephone Encounter (Signed)
Pt aware that letter has been dictated and I will check with Raynelle Fanning or Synetta Fail in the morning about getting this sent up asap. Pt aware I will call her once ready for pick up.

## 2012-03-26 NOTE — Telephone Encounter (Signed)
Called pt's home # - [phone] - lmomtcb  Called pt's cell # - [phone] - [email or web address]

## 2012-03-26 NOTE — Telephone Encounter (Signed)
Returning call can be reached at [phone].Sherri [name]

## 2012-03-26 NOTE — Telephone Encounter (Signed)
Pt is aware that letter was left up front .

## 2012-03-26 NOTE — Telephone Encounter (Signed)
Called # provided below - [email or web address] 

## 2012-03-26 NOTE — Telephone Encounter (Signed)
Called, spoke with pt.  She is returning call - a duplicate phone msg was taken on the same issue.  Pt is aware letter has been dictated, and we will call her once this is ready for pick up.  She had no further complaints or concerns at this time.  Nothing further needed.  Will forward msg to Katie's box to be on the look out for this.

## 2012-03-26 NOTE — Letter (Signed)
March 25, 2012    RE:  Sherri [name], Sherri [name] MRN:  [id number]  /  DOB:  1966-07-01  To Whom It May Concern:  Sherri [name] has been under my care for pulmonary and allergy problems.  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  She was most recently seen on March 01, 2012, at which time she reported she had been reassigned to a work area where this exposure resumed.  She is finding that antihistamines and appropriate medications do not suppress symptoms adequately, and that she cannot work unless she is reassigned away from direct exposure to mail and paper handling equipment related to the post office environment.  She apparently did well in an office environment previously.  Based on her report, she will need to be out of work until or unless she can be reassigned.   Sincerely,     Mckena Chern D. Maple Hudson, MD, FCCP, FACP   CDY/MedQ  DD: 03/25/2012  DT: 03/26/2012  Job #: [id number]

## 2012-03-26 NOTE — Telephone Encounter (Signed)
This is a duplicate phone msg - pls see phone msg from 03/21/12 for further information.  Pt needing letter which is in the process of being dictated.

## 2012-03-26 NOTE — Telephone Encounter (Signed)
Pt returned call & can be reached [phone].  Please a message if pt is not there.  Sherri [name]

## 2012-05-01 ENCOUNTER — Telehealth: Payer: Self-pay | Admitting: Hematology & Oncology

## 2012-05-01 NOTE — Telephone Encounter (Signed)
Faxed records to [employer] in response to faxed request.

## 2012-05-02 ENCOUNTER — Other Ambulatory Visit: Payer: Self-pay | Admitting: Orthopaedic Surgery

## 2012-05-02 DIAGNOSIS — M5126 Other intervertebral disc displacement, lumbar region: Secondary | ICD-10-CM

## 2012-05-04 ENCOUNTER — Ambulatory Visit
Admission: RE | Admit: 2012-05-04 | Discharge: 2012-05-04 | Disposition: A | Discharge status: Home / Self Care | Payer: Worker's Compensation | Source: Ambulatory Visit | Attending: Orthopaedic Surgery | Admitting: Orthopaedic Surgery

## 2012-05-04 DIAGNOSIS — M5126 Other intervertebral disc displacement, lumbar region: Secondary | ICD-10-CM

## 2012-05-06 ENCOUNTER — Ambulatory Visit: Payer: Federal, State, Local not specified - PPO

## 2012-05-06 ENCOUNTER — Ambulatory Visit (HOSPITAL_BASED_OUTPATIENT_CLINIC_OR_DEPARTMENT_OTHER): Payer: Federal, State, Local not specified - PPO | Admitting: Hematology & Oncology

## 2012-05-06 ENCOUNTER — Other Ambulatory Visit: Payer: Worker's Compensation | Admitting: Lab

## 2012-05-06 VITALS — BP 126/83 | HR 76 | Temp 97.0°F | Ht 63.0 in | Wt 165.0 lb

## 2012-05-06 DIAGNOSIS — D510 Vitamin B12 deficiency anemia due to intrinsic factor deficiency: Secondary | ICD-10-CM

## 2012-05-06 DIAGNOSIS — D509 Iron deficiency anemia, unspecified: Secondary | ICD-10-CM

## 2012-05-06 LAB — CBC WITH DIFFERENTIAL (CANCER CENTER ONLY)
BASO#: 0 10*3/uL (ref 0.0–0.2)
Eosinophils Absolute: 0 10*3/uL (ref 0.0–0.5)
HGB: 12.2 g/dL (ref 11.6–15.9)
LYMPH#: 3 10*3/uL (ref 0.9–3.3)
LYMPH%: 16.8 % (ref 14.0–48.0)
MCH: 28.5 pg (ref 26.0–34.0)
MCV: 86 fL (ref 81–101)
MONO%: 6.5 % (ref 0.0–13.0)
NEUT#: 13.5 10*3/uL — ABNORMAL HIGH (ref 1.5–6.5)
RBC: 4.28 10*6/uL (ref 3.70–5.32)

## 2012-05-06 LAB — RETICULOCYTES (CHCC): ABS Retic: 100.1 10*3/uL (ref 19.0–186.0)

## 2012-05-06 LAB — IRON AND TIBC: Iron: 79 ug/dL (ref 42–145)

## 2012-05-06 MED ORDER — CYANOCOBALAMIN 1000 MCG/ML IJ SOLN
1000.0000 ug | Freq: Once | INTRAMUSCULAR | Status: AC
  Administered 2012-05-06: 1000 ug via INTRAMUSCULAR

## 2012-05-06 NOTE — Progress Notes (Signed)
This office note has been dictated.

## 2012-05-06 NOTE — Patient Instructions (Signed)

## 2012-05-07 NOTE — Progress Notes (Signed)
CC:   Mark C. Ophelia Charter, M.D. Clinton D. Maple Hudson, MD, FCCP, FACP Andee Poles, M.D.  DIAGNOSES: 1. Pernicious anemia-anti intrinsic factor antibodies. 2. Intermittent iron deficiency anemia. 3. 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 4. 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  CURRENT THERAPY:  Vitamin B12 1 mg IM q.3 months.  INTERIM HISTORY:  Sherri [name] comes in for followup.  She is having more back issues now.  She sees Dr. Ophelia Charter.  It sounds like he is going to operate on her.  It sounds like this might be sometime this summer.  She had an MRI over the weekend.  Her daughter is pregnant.  She is I think 1 years old.  She is not married.  The father is nowhere to be found.  This is also adding stress.  Sherri [name] gets vitamin B12 every 3 months.  Her B12 level today was 428.  PHYSICAL EXAM:  This is a petite black female in no obvious distress. Vital signs:  Temperature of 97, pulse 76, respiratory rate 18, blood pressure 126/83, weight is 165.  Head/Neck:  Normocephalic, atraumatic skull.  There are no ocular or oral lesions.  There are no palpable cervical or supraclavicular lymph nodes.  Lungs:  Clear bilaterally. Cardiac:  Regular rate and rhythm with normal S1, S2.  There are no murmurs, rubs or bruits.  Abdomen:  Soft with good bowel sounds.  There is no palpable abdominal mass.  There is no fluid wave.  There is no palpable hepatosplenomegaly.  Extremities:  No clubbing, cyanosis or edema.  Neurological:  No focal neurological deficits.  LABORATORY STUDIES:  White cell count is 17.6, hemoglobin 12.2, hematocrit 36.8, platelet count 294.  MCV is 86. Vitamin B12 is 428.  Ferritin is 101.  Iron saturation is 23%.  IMPRESSION:  Sherri [name] is a 46 year old African American female with pernicious anemia.  She has a history of iron deficiency in the past.  I think she is doing well on the B12 every 3 months.  Will plan on getting her back in 3  months.  I do not think we need any blood work in between visits.  I do not think she needs any iron.    ______________________________ Josph Macho, M.D. PRE/MEDQ  D:  05/06/2012  T:  05/07/2012  Job:  [id number]

## 2012-05-08 ENCOUNTER — Other Ambulatory Visit: Payer: Federal, State, Local not specified - PPO

## 2012-05-08 ENCOUNTER — Telehealth: Payer: Self-pay | Admitting: Internal Medicine

## 2012-05-08 NOTE — Telephone Encounter (Addendum)
Per last ov note with CDY:  Transcription       [facility] by Waymon Budge, MD on 03/25/2012 5:13 PM       March 25, 2012  RE: JESSIAH, WOJNAR  MRN: [id number] / DOB: August 11, 1966  To Whom It May Concern:  [name] has been under my care for pulmonary and allergy  problems. 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 She was most recently seen on March 01, 2012, at which time she reported she had been reassigned to a work area  where this exposure resumed. She is finding that antihistamines and  appropriate medications do not suppress symptoms adequately, and that  she cannot work unless she is reassigned away from direct exposure to  mail and paper handling equipment related to the post office  environment. She apparently did well in an office environment  previously. Based on her report, she will need to be out of work until  or unless she can be reassigned.  Sincerely,  Clinton D. Maple Hudson, MD, FCCP, FACP  CDY/MedQ DD: 03/25/2012 DT: 03/26/2012 Job #: [id number]    Was this not sufficient?  Does she need another letter similar that includes her proair and advair? LMOM TCB x1.

## 2012-05-08 NOTE — Telephone Encounter (Signed)
I spoke with pt and she states that workers comp needs a Physicist, medical from [employer] stating to why she needs the advair and proair inhaler and needs to be in detail. The letter that was given to her in April was to help her get out of that particular job she was in. Please advise Dr. Maple Hudson, thanks

## 2012-05-22 NOTE — Telephone Encounter (Signed)
I spoke with Florentina Addison and she states Dr. Maple Hudson is aware of this message and is working on this for the pt. She states to leave message in his box. I will forward message back to him.Carron Curie, CMA

## 2012-05-27 NOTE — Telephone Encounter (Signed)
Patient calling stating she is faxing over paperwork is regards to worker's comp.  Please notify patient when received/ ready.

## 2012-05-30 NOTE — Telephone Encounter (Signed)
Sherri [name], were the forms ever received?

## 2012-06-03 ENCOUNTER — Telehealth: Payer: Self-pay | Admitting: Internal Medicine

## 2012-06-03 NOTE — Telephone Encounter (Signed)
Pt returned triage's call.  Holly D Pryor ° °

## 2012-06-03 NOTE — Telephone Encounter (Signed)
lmomtcb x1 for pt 

## 2012-06-03 NOTE — Telephone Encounter (Signed)
I spoke with pt and she states the letter cdy gave her needs to be corrected. It states she had a recent OV on 03/01/12 and she was not seen until 03/14/12. Per pt her claim was denied due to this and would like the date fixed and it has to be documented why the date was changed in the letter. Please advise Dr. Maple Hudson thanks

## 2012-06-04 NOTE — Telephone Encounter (Signed)
I have not gotten any paperwork-will forward to CY and ask him as well about this.

## 2012-06-10 NOTE — Telephone Encounter (Signed)
Please advise, thanks.

## 2012-06-10 NOTE — Telephone Encounter (Addendum)
Have these forms been located?  I see where a letter has been done in the past (March 2013) but not one more recently than that.  Please advise, thanks.

## 2012-06-11 ENCOUNTER — Encounter: Payer: Self-pay | Admitting: Internal Medicine

## 2012-06-11 NOTE — Telephone Encounter (Signed)
Letter redictated

## 2012-06-12 NOTE — Letter (Addendum)
June 11, 2012    RE:  JERRELL, HART MRN:  [id number]  /  DOB:  [birth year]  To Whom It May Concern:  I had dictated a letter on Ms. Thornley previously, but through my error, I had indicated dates incorrectly.  She has been under my care for pulmonary and allergy problems.  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  She was most recently seen on March 14, 2012, at which time she reported she had been reassigned to a work area where this exposure resumed.  I believe that was around March 01, 2012.  She found that antihistamines and appropriate medications did not suppress symptoms adequately, and that she cannot work unless she is reassigned away from direct exposure to mail and paper handling equipment related to the post office environment.  She apparently did well in an office environment previously.  Based on her report, she will need to be out of work until or unless she can be reassigned.   Sincerely,     Porter Moes D. Maple Hudson, MD, Tonny Bollman, FACP Electronically Signed   CDY/MedQ  DD: 06/11/2012  DT: 06/11/2012  Job #: [id number]

## 2012-06-12 NOTE — Telephone Encounter (Signed)
Letter signed and placed at front for pick up at pts request.

## 2012-06-14 NOTE — Telephone Encounter (Signed)
Katie have you seen any forms on this pt?

## 2012-06-21 NOTE — Telephone Encounter (Signed)
Spoke with patient today-she states that she has gotten everything taken care of. No paper work from workers comp for [employer] to sign. Sorry for the mix up. Pt understands that I will sign the message and if she should find she needs some assistance or other concerns to please call the office.

## 2012-07-29 ENCOUNTER — Other Ambulatory Visit (HOSPITAL_BASED_OUTPATIENT_CLINIC_OR_DEPARTMENT_OTHER): Payer: Federal, State, Local not specified - PPO | Admitting: Lab

## 2012-07-29 ENCOUNTER — Ambulatory Visit (HOSPITAL_BASED_OUTPATIENT_CLINIC_OR_DEPARTMENT_OTHER): Payer: Federal, State, Local not specified - PPO | Admitting: Medical

## 2012-07-29 ENCOUNTER — Ambulatory Visit (HOSPITAL_BASED_OUTPATIENT_CLINIC_OR_DEPARTMENT_OTHER): Payer: Federal, State, Local not specified - PPO

## 2012-07-29 VITALS — BP 143/87 | HR 99 | Temp 96.4°F | Resp 18 | Wt 176.0 lb

## 2012-07-29 DIAGNOSIS — D509 Iron deficiency anemia, unspecified: Secondary | ICD-10-CM

## 2012-07-29 DIAGNOSIS — D510 Vitamin B12 deficiency anemia due to intrinsic factor deficiency: Secondary | ICD-10-CM

## 2012-07-29 DIAGNOSIS — IMO0002 Reserved for concepts with insufficient information to code with codable children: Secondary | ICD-10-CM

## 2012-07-29 LAB — VITAMIN B12: Vitamin B-12: 437 pg/mL (ref 211–911)

## 2012-07-29 LAB — IRON AND TIBC
%SAT: 24 % (ref 20–55)
Iron: 81 ug/dL (ref 42–145)

## 2012-07-29 LAB — CBC WITH DIFFERENTIAL (CANCER CENTER ONLY)
BASO#: 0 10*3/uL (ref 0.0–0.2)
EOS%: 0 % (ref 0.0–7.0)
Eosinophils Absolute: 0 10*3/uL (ref 0.0–0.5)
HCT: 36.5 % (ref 34.8–46.6)
HGB: 12.3 g/dL (ref 11.6–15.9)
LYMPH%: 31.6 % (ref 14.0–48.0)
MCHC: 33.7 g/dL (ref 32.0–36.0)
MCV: 85 fL (ref 81–101)
MONO#: 0.3 10*3/uL (ref 0.1–0.9)
NEUT%: 63.3 % (ref 39.6–80.0)
RDW: 11.9 % (ref 11.1–15.7)
WBC: 6.5 10*3/uL (ref 3.9–10.0)

## 2012-07-29 MED ORDER — CYANOCOBALAMIN 1000 MCG/ML IJ SOLN
1000.0000 ug | Freq: Once | INTRAMUSCULAR | Status: AC
  Administered 2012-07-29: 1000 ug via INTRAMUSCULAR

## 2012-07-29 NOTE — Progress Notes (Signed)
Patient Name : Sherri [name], Sherri [name] MR #[id number] DOB: [birth year] Encounter Date: 07/29/2012 Dictated by Eunice Blase, PA-C  Diagnoses: #1 pernicious anemia-anti intrinsic factor antibodies. #2 intermittent iron deficiency anemia. #3 severe low back pain, secondary to disc herniation. 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  Current therapy: Vitamin B12 1 mg IM every 3 months.  Interim history: Sherri [name] presents today for an office followup visit.  Overall, she, reports, that she is doing relatively well.  She states, that her 59 year old daughter is due within the next few weeks to have a baby boy.  She still continues to have some back issues with a disc herniation.  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  She otherwise reports, that she's not having any headaches, any blurred, vision.  She's not having any mental status, changes.  She's not having any nausea, vomiting, diarrhea, constipation.  She denies any obvious, bleeding.  She denies any upper or lower extremity he swelling.  She has a good appetite.  She has a decent energy level.  She continues to receive vitamin B12 every 3 months.  Her last vitamin B12 back in may of 2013 was 428.  Her iron level was 79, with 23% saturation.  Review of Systems:Significant for lower back pain, otherwise: Pt. Denies any changes in their vision, hearing, adenopathy, fevers, chills, nausea, vomiting, diarrhea, constipation, chest pain, shortness of breath, passing blood, passing out, blacking out,  any changes in skin, joints, neurologic or psychiatric except as noted.  Physical Exam: This is a 46 year old, well-developed, well-nourished, African American, female, in no obvious distress Vitals: Temperature 96.4 degrees, pulse 99, respirations 18, blood pressure 143/87, weight 176 pounds HEENT reveals a normocephalic, atraumatic skull, no scleral icterus, no oral lesions    Neck is supple without any cervical or supraclavicular adenopathy.  Lungs are clear to auscultation bilaterally. There are no wheezes, rales or rhonci Cardiac is regular rate and rhythm with a normal S1 and S2. There are no murmurs, rubs, or bruits.  Abdomen is soft with good bowel sounds there's a palpable mass. There is no palpable hepatosplenomegaly. There is no palpable fluid wave.  Musculoskeletal no tenderness of the spine, ribs, or hips.  Extremities there are no clubbing, cyanosis, or edema.  Skin no petechia, purpura or ecchymosis Neurologic is nonfocal.  Laboratory Data: White count 6.5, hemoglobin 12.3, hematocrit 36.5, MCV 85, platelets 263,000 Ferritin, iron panel, and vitamin B12 are pending from today  Current Outpatient Prescriptions on File Prior to Visit  Medication Sig Dispense Refill  . Albuterol Sulfate (PROAIR HFA IN) Inhale 1-2 puffs into the lungs every 4 (four) hours as needed. Shortness of breath or wheezing      . ALPRAZolam (XANAX) 0.5 MG tablet Take 0.5 mg by mouth 3 (three) times daily as needed. anxiety      . cyanocobalamin (,VITAMIN B-12,) 1000 MCG/ML injection Inject 1,000 mcg into the muscle every 3 (three) months.       . desloratadine (CLARINEX) 5 MG tablet Take 1 tablet (5 mg total) by mouth daily.  30 tablet  0  . diphenhydrAMINE (BENADRYL) 25 mg capsule Take 25 mg by mouth at bedtime as needed. Chronic illness, itching      . EPINEPHrine (EPIPEN) 0.3 mg/0.3 mL DEVI For severe allergic reaction  1 Device  prn  . escitalopram (LEXAPRO) 20 MG tablet Take 20 mg by mouth daily.        [name]  fluticasone (FLONASE) 50 MCG/ACT nasal spray Place 2 sprays into the nose daily as needed. For allergy      . Fluticasone-Salmeterol (ADVAIR) 250-50 MCG/DOSE AEPB 1 puff, twice daily- rinse mouth well after use  60 each  prn  . gabapentin (NEURONTIN) 300 MG capsule Take 300 mg by mouth 3 (three) times daily.       [name] HYDROcodone-acetaminophen (NORCO) 5-325 MG per tablet  every 6 (six) hours as needed.       [name] ibuprofen (ADVIL,MOTRIN) 600 MG tablet Take 600 mg by mouth every 6 (six) hours as needed. For pain      . levocetirizine (XYZAL) 5 MG tablet Take 1 tablet (5 mg total) by mouth every evening.  30 tablet  0  . montelukast (SINGULAIR) 10 MG tablet Take 10 mg by mouth at bedtime.        . PredniSONE 10 MG KIT Take by mouth. Tapering down each day. Pack of 21 pills.       Assessment/Plan: This is a 46 year old, female, with the following issues: #1 pernicious anemia-she does have a history of iron deficiency in the past.  She is doing well on the vitamin B12 injection every 3 months, and we will continue with this plan.  #2 followup-we will follow back up with Sherri [name] in 3 months, but before then should there be questions or concerns.

## 2012-08-05 ENCOUNTER — Telehealth: Payer: Self-pay | Admitting: *Deleted

## 2012-08-05 NOTE — Telephone Encounter (Signed)
Called patient to let her know that her iron levels were ok per dr. ennever 

## 2012-08-05 NOTE — Telephone Encounter (Signed)
Message copied by Anselm Jungling on Mon Aug 05, 2012 12:03 PM ------      Message from: Josph Macho      Created: Sun Aug 04, 2012  8:17 PM       Call - iron is ok!!  pete

## 2012-08-27 ENCOUNTER — Encounter (HOSPITAL_COMMUNITY): Payer: Self-pay | Admitting: *Deleted

## 2012-08-27 ENCOUNTER — Inpatient Hospital Stay (HOSPITAL_COMMUNITY)
Admission: EM | Admit: 2012-08-27 | Discharge: 2012-09-05 | DRG: 947 | Disposition: A | Discharge status: Home / Self Care | Attending: Internal Medicine | Admitting: Internal Medicine

## 2012-08-27 DIAGNOSIS — M5126 Other intervertebral disc displacement, lumbar region: Secondary | ICD-10-CM | POA: Diagnosis present

## 2012-08-27 DIAGNOSIS — IMO0002 Reserved for concepts with insufficient information to code with codable children: Secondary | ICD-10-CM

## 2012-08-27 DIAGNOSIS — M5137 Other intervertebral disc degeneration, lumbosacral region: Secondary | ICD-10-CM | POA: Diagnosis present

## 2012-08-27 DIAGNOSIS — N189 Chronic kidney disease, unspecified: Secondary | ICD-10-CM | POA: Diagnosis present

## 2012-08-27 DIAGNOSIS — E876 Hypokalemia: Secondary | ICD-10-CM | POA: Diagnosis present

## 2012-08-27 DIAGNOSIS — D649 Anemia, unspecified: Secondary | ICD-10-CM | POA: Diagnosis present

## 2012-08-27 DIAGNOSIS — K5900 Constipation, unspecified: Secondary | ICD-10-CM | POA: Diagnosis not present

## 2012-08-27 DIAGNOSIS — I80299 Phlebitis and thrombophlebitis of other deep vessels of unspecified lower extremity: Secondary | ICD-10-CM

## 2012-08-27 DIAGNOSIS — Z79899 Other long term (current) drug therapy: Secondary | ICD-10-CM

## 2012-08-27 DIAGNOSIS — F1320 Sedative, hypnotic or anxiolytic dependence, uncomplicated: Secondary | ICD-10-CM | POA: Diagnosis not present

## 2012-08-27 DIAGNOSIS — M6282 Rhabdomyolysis: Secondary | ICD-10-CM | POA: Diagnosis present

## 2012-08-27 DIAGNOSIS — G8929 Other chronic pain: Secondary | ICD-10-CM | POA: Diagnosis present

## 2012-08-27 DIAGNOSIS — L509 Urticaria, unspecified: Secondary | ICD-10-CM

## 2012-08-27 DIAGNOSIS — E870 Hyperosmolality and hypernatremia: Secondary | ICD-10-CM | POA: Diagnosis present

## 2012-08-27 DIAGNOSIS — E872 Acidosis, unspecified: Secondary | ICD-10-CM | POA: Diagnosis present

## 2012-08-27 DIAGNOSIS — D72829 Elevated white blood cell count, unspecified: Secondary | ICD-10-CM | POA: Diagnosis present

## 2012-08-27 DIAGNOSIS — M129 Arthropathy, unspecified: Secondary | ICD-10-CM | POA: Diagnosis present

## 2012-08-27 DIAGNOSIS — K219 Gastro-esophageal reflux disease without esophagitis: Secondary | ICD-10-CM | POA: Diagnosis present

## 2012-08-27 DIAGNOSIS — F13239 Sedative, hypnotic or anxiolytic dependence with withdrawal, unspecified: Secondary | ICD-10-CM

## 2012-08-27 DIAGNOSIS — J189 Pneumonia, unspecified organism: Secondary | ICD-10-CM | POA: Diagnosis present

## 2012-08-27 DIAGNOSIS — F13939 Sedative, hypnotic or anxiolytic use, unspecified with withdrawal, unspecified: Secondary | ICD-10-CM

## 2012-08-27 DIAGNOSIS — M51379 Other intervertebral disc degeneration, lumbosacral region without mention of lumbar back pain or lower extremity pain: Secondary | ICD-10-CM | POA: Diagnosis present

## 2012-08-27 DIAGNOSIS — I739 Peripheral vascular disease, unspecified: Secondary | ICD-10-CM | POA: Diagnosis present

## 2012-08-27 DIAGNOSIS — D510 Vitamin B12 deficiency anemia due to intrinsic factor deficiency: Secondary | ICD-10-CM

## 2012-08-27 DIAGNOSIS — M62838 Other muscle spasm: Secondary | ICD-10-CM

## 2012-08-27 DIAGNOSIS — N390 Urinary tract infection, site not specified: Secondary | ICD-10-CM | POA: Diagnosis present

## 2012-08-27 DIAGNOSIS — I498 Other specified cardiac arrhythmias: Secondary | ICD-10-CM | POA: Diagnosis present

## 2012-08-27 DIAGNOSIS — N179 Acute kidney failure, unspecified: Secondary | ICD-10-CM | POA: Diagnosis present

## 2012-08-27 DIAGNOSIS — M549 Dorsalgia, unspecified: Secondary | ICD-10-CM | POA: Diagnosis present

## 2012-08-27 DIAGNOSIS — R52 Pain, unspecified: Principal | ICD-10-CM | POA: Diagnosis present

## 2012-08-27 DIAGNOSIS — F19939 Other psychoactive substance use, unspecified with withdrawal, unspecified: Secondary | ICD-10-CM | POA: Diagnosis not present

## 2012-08-27 DIAGNOSIS — J4520 Mild intermittent asthma, uncomplicated: Secondary | ICD-10-CM | POA: Diagnosis present

## 2012-08-27 DIAGNOSIS — F19239 Other psychoactive substance dependence with withdrawal, unspecified: Secondary | ICD-10-CM | POA: Diagnosis not present

## 2012-08-27 DIAGNOSIS — J45909 Unspecified asthma, uncomplicated: Secondary | ICD-10-CM

## 2012-08-27 DIAGNOSIS — E871 Hypo-osmolality and hyponatremia: Secondary | ICD-10-CM | POA: Diagnosis present

## 2012-08-27 HISTORY — DX: Major depressive disorder, single episode, unspecified: F32.9

## 2012-08-27 HISTORY — DX: Chronic kidney disease, unspecified: N18.9

## 2012-08-27 HISTORY — DX: Other chronic pain: G89.29

## 2012-08-27 HISTORY — DX: Peripheral vascular disease, unspecified: I73.9

## 2012-08-27 HISTORY — DX: Unspecified osteoarthritis, unspecified site: M19.90

## 2012-08-27 HISTORY — DX: Rhabdomyolysis: M62.82

## 2012-08-27 HISTORY — DX: Pneumonia, unspecified organism: J18.9

## 2012-08-27 HISTORY — DX: Anxiety disorder, unspecified: F41.9

## 2012-08-27 HISTORY — DX: Dorsalgia, unspecified: M54.9

## 2012-08-27 HISTORY — DX: Gastro-esophageal reflux disease without esophagitis: K21.9

## 2012-08-27 HISTORY — DX: Depression, unspecified: F32.A

## 2012-08-27 LAB — CBC WITH DIFFERENTIAL/PLATELET
Basophils Absolute: 0 10*3/uL (ref 0.0–0.1)
Basophils Relative: 0 % (ref 0–1)
Eosinophils Absolute: 0 10*3/uL (ref 0.0–0.7)
Eosinophils Relative: 0 % (ref 0–5)
HCT: 33 % — ABNORMAL LOW (ref 36.0–46.0)
Hemoglobin: 11 g/dL — ABNORMAL LOW (ref 12.0–15.0)
Lymphocytes Relative: 3 % — ABNORMAL LOW (ref 12–46)
Lymphs Abs: 0.6 10*3/uL — ABNORMAL LOW (ref 0.7–4.0)
MCH: 27.7 pg (ref 26.0–34.0)
MCHC: 33.3 g/dL (ref 30.0–36.0)
MCV: 83.1 fL (ref 78.0–100.0)
Monocytes Absolute: 1.3 10*3/uL — ABNORMAL HIGH (ref 0.1–1.0)
Monocytes Relative: 6 % (ref 3–12)
Neutro Abs: 20.8 10*3/uL — ABNORMAL HIGH (ref 1.7–7.7)
Neutrophils Relative %: 92 % — ABNORMAL HIGH (ref 43–77)
Platelets: 248 10*3/uL (ref 150–400)
RBC: 3.97 MIL/uL (ref 3.87–5.11)
RDW: 12.5 % (ref 11.5–15.5)
WBC: 22.8 10*3/uL — ABNORMAL HIGH (ref 4.0–10.5)

## 2012-08-27 LAB — BASIC METABOLIC PANEL
BUN: 22 mg/dL (ref 6–23)
CO2: 19 mEq/L (ref 19–32)
Calcium: 9.4 mg/dL (ref 8.4–10.5)
Chloride: 114 mEq/L — ABNORMAL HIGH (ref 96–112)
Creatinine, Ser: 2.07 mg/dL — ABNORMAL HIGH (ref 0.50–1.10)
GFR calc Af Amer: 32 mL/min — ABNORMAL LOW (ref >90)
GFR calc non Af Amer: 27 mL/min — ABNORMAL LOW (ref >90)
Glucose, Bld: 63 mg/dL — ABNORMAL LOW (ref 70–99)
Potassium: 3 mEq/L — ABNORMAL LOW (ref 3.5–5.1)
Sodium: 153 mEq/L — ABNORMAL HIGH (ref 135–145)

## 2012-08-27 LAB — URINE MICROSCOPIC-ADD ON

## 2012-08-27 LAB — URINALYSIS, ROUTINE W REFLEX MICROSCOPIC
Bilirubin Urine: NEGATIVE
Glucose, UA: NEGATIVE mg/dL
Ketones, ur: 40 mg/dL — AB
Nitrite: NEGATIVE
Protein, ur: 100 mg/dL — AB
Specific Gravity, Urine: 1.021 (ref 1.005–1.030)
Urobilinogen, UA: 1 mg/dL (ref 0.0–1.0)
pH: 5.5 (ref 5.0–8.0)

## 2012-08-27 LAB — LACTIC ACID, PLASMA: Lactic Acid, Venous: 1 mmol/L (ref 0.5–2.2)

## 2012-08-27 LAB — CK: Total CK: 5632 U/L — ABNORMAL HIGH (ref 7–177)

## 2012-08-27 LAB — MAGNESIUM: Magnesium: 2.2 mg/dL (ref 1.5–2.5)

## 2012-08-27 MED ORDER — SODIUM CHLORIDE 0.9 % IV SOLN
INTRAVENOUS | Status: DC
  Administered 2012-08-28: 10:00:00 via INTRAVENOUS

## 2012-08-27 MED ORDER — ALBUTEROL SULFATE (5 MG/ML) 0.5% IN NEBU
2.5000 mg | INHALATION_SOLUTION | RESPIRATORY_TRACT | Status: DC | PRN
  Administered 2012-09-02: 2.5 mg via RESPIRATORY_TRACT
  Filled 2012-08-27: qty 0.5

## 2012-08-27 MED ORDER — ALPRAZOLAM 0.5 MG PO TABS
0.5000 mg | ORAL_TABLET | Freq: Three times a day (TID) | ORAL | Status: DC | PRN
  Administered 2012-08-27 – 2012-08-29 (×3): 0.5 mg via ORAL
  Filled 2012-08-27 (×4): qty 1

## 2012-08-27 MED ORDER — SODIUM CHLORIDE 0.9 % IV SOLN: 250.0000 mL | INTRAVENOUS | Status: DC | PRN

## 2012-08-27 MED ORDER — FLUTICASONE-SALMETEROL 250-50 MCG/DOSE IN AEPB
1.0000 | INHALATION_SPRAY | Freq: Two times a day (BID) | RESPIRATORY_TRACT | Status: DC
  Administered 2012-08-28 – 2012-09-05 (×13): 1 via RESPIRATORY_TRACT
  Filled 2012-08-27 (×2): qty 14

## 2012-08-27 MED ORDER — METHOCARBAMOL 500 MG PO TABS
500.0000 mg | ORAL_TABLET | Freq: Three times a day (TID) | ORAL | Status: DC
  Administered 2012-08-27 – 2012-08-28 (×2): 500 mg via ORAL
  Filled 2012-08-27 (×5): qty 1

## 2012-08-27 MED ORDER — SODIUM CHLORIDE 0.9 % IJ SOLN: 3.0000 mL | Freq: Two times a day (BID) | INTRAMUSCULAR | Status: DC

## 2012-08-27 MED ORDER — SODIUM CHLORIDE 0.9 % IV BOLUS (SEPSIS)
1000.0000 mL | Freq: Once | INTRAVENOUS | Status: AC
  Administered 2012-08-27: 1000 mL via INTRAVENOUS

## 2012-08-27 MED ORDER — HYDROMORPHONE HCL PF 1 MG/ML IJ SOLN
1.0000 mg | Freq: Once | INTRAMUSCULAR | Status: DC
  Filled 2012-08-27: qty 1

## 2012-08-27 MED ORDER — METHOCARBAMOL 100 MG/ML IJ SOLN: 500.0000 mg | Freq: Three times a day (TID) | INTRAVENOUS | Status: DC

## 2012-08-27 MED ORDER — ESCITALOPRAM OXALATE 20 MG PO TABS
20.0000 mg | ORAL_TABLET | Freq: Every day | ORAL | Status: DC
  Administered 2012-08-28 – 2012-09-05 (×9): 20 mg via ORAL
  Filled 2012-08-27 (×10): qty 1

## 2012-08-27 MED ORDER — LORATADINE 10 MG PO TABS
10.0000 mg | ORAL_TABLET | Freq: Every day | ORAL | Status: DC
  Administered 2012-08-28 – 2012-09-05 (×5): 10 mg via ORAL
  Filled 2012-08-27 (×9): qty 1

## 2012-08-27 MED ORDER — POTASSIUM CHLORIDE CRYS ER 20 MEQ PO TBCR
40.0000 meq | EXTENDED_RELEASE_TABLET | Freq: Once | ORAL | Status: AC
  Administered 2012-08-28: 40 meq via ORAL
  Filled 2012-08-27: qty 2

## 2012-08-27 MED ORDER — LORAZEPAM 2 MG/ML IJ SOLN
1.0000 mg | Freq: Once | INTRAMUSCULAR | Status: AC
  Administered 2012-08-27: 1 mg via INTRAVENOUS
  Filled 2012-08-27: qty 1

## 2012-08-27 MED ORDER — FLUTICASONE PROPIONATE 50 MCG/ACT NA SUSP: 2.0000 | Freq: Every day | NASAL | Status: DC | PRN

## 2012-08-27 MED ORDER — MONTELUKAST SODIUM 10 MG PO TABS
10.0000 mg | ORAL_TABLET | Freq: Every day | ORAL | Status: DC
  Administered 2012-08-28 – 2012-09-04 (×7): 10 mg via ORAL
  Filled 2012-08-27 (×11): qty 1

## 2012-08-27 MED ORDER — SODIUM CHLORIDE 0.9 % IJ SOLN: 3.0000 mL | INTRAMUSCULAR | Status: DC | PRN

## 2012-08-27 MED ORDER — ONDANSETRON HCL 4 MG/2ML IJ SOLN: 4.0000 mg | Freq: Four times a day (QID) | INTRAMUSCULAR | Status: DC | PRN

## 2012-08-27 MED ORDER — DOCUSATE SODIUM 100 MG PO CAPS
100.0000 mg | ORAL_CAPSULE | Freq: Two times a day (BID) | ORAL | Status: DC
  Administered 2012-08-28 – 2012-09-04 (×6): 100 mg via ORAL
  Filled 2012-08-27 (×18): qty 1

## 2012-08-27 MED ORDER — DIAZEPAM 5 MG/ML IJ SOLN
5.0000 mg | Freq: Once | INTRAMUSCULAR | Status: AC
  Administered 2012-08-27: 5 mg via INTRAVENOUS
  Filled 2012-08-27: qty 2

## 2012-08-27 MED ORDER — HYDROMORPHONE HCL PF 1 MG/ML IJ SOLN
1.0000 mg | INTRAMUSCULAR | Status: DC | PRN
  Administered 2012-08-27 – 2012-08-28 (×4): 1 mg via INTRAVENOUS
  Filled 2012-08-27 (×4): qty 1

## 2012-08-27 MED ORDER — ONDANSETRON HCL 4 MG PO TABS
4.0000 mg | ORAL_TABLET | Freq: Four times a day (QID) | ORAL | Status: DC | PRN
  Filled 2012-08-27: qty 1

## 2012-08-27 MED ORDER — HYDROMORPHONE HCL PF 1 MG/ML IJ SOLN
1.0000 mg | Freq: Once | INTRAMUSCULAR | Status: AC
  Administered 2012-08-27: 1 mg via INTRAVENOUS

## 2012-08-27 MED ORDER — GABAPENTIN 300 MG PO CAPS
300.0000 mg | ORAL_CAPSULE | Freq: Three times a day (TID) | ORAL | Status: DC
  Administered 2012-08-28 – 2012-09-05 (×25): 300 mg via ORAL
  Filled 2012-08-27 (×29): qty 1

## 2012-08-27 MED ORDER — OXYCODONE HCL 5 MG PO TABS
5.0000 mg | ORAL_TABLET | ORAL | Status: DC | PRN
  Administered 2012-08-28 – 2012-09-05 (×22): 5 mg via ORAL
  Filled 2012-08-27 (×22): qty 1

## 2012-08-27 MED ORDER — MORPHINE SULFATE 4 MG/ML IJ SOLN
4.0000 mg | Freq: Once | INTRAMUSCULAR | Status: AC
  Administered 2012-08-27: 4 mg via INTRAVENOUS
  Filled 2012-08-27: qty 1

## 2012-08-27 NOTE — ED Notes (Signed)
Unable to get blood pressure reading for pt was thrashing all over the stretcher and would not hold still; friend at bedside; RN notified

## 2012-08-27 NOTE — ED Provider Notes (Signed)
Medical screening examination/treatment/procedure(s) were conducted as a shared visit with non-physician practitioner(s) and myself.  I personally evaluated the patient during the encounter   Rolan Bucco, MD 08/27/12 [phone]

## 2012-08-27 NOTE — ED Provider Notes (Signed)
History     CSN: [id number]  Arrival date & time 08/27/12  5621   First MD Initiated Contact with Patient 08/27/12 [phone]      Chief Complaint  Patient presents with  . Back Pain    (Consider location/radiation/quality/duration/timing/severity/associated sxs/prior treatment) HPI The patient presents to the ER with back spasms that cause her whole body to spasm she states. The patient denies fevers, chest pain, cough, sob weakness, numbness, visual changes, headache, diarrhea, or abdominal pain. The patient is not the greatest historian. The mother provided hx as well. The mother states that she has had multiple episodes similar to this in the past. The patients body is tensed and she is sweating. Patient states that she ran out of pain medications. The patient states that she usually gets IV xanax and morphine when this happens. Past Medical History  Diagnosis Date  . Pernicious anemia 10/30/2011  . Lumbar herniated disc   . Environmental allergies   . Angioedema   . DVT (deep venous thrombosis)   . Asthma   . Seasonal allergies   . Urticaria   . Chronic back pain     Past Surgical History  Procedure Date  . Hernia repair     Family History  Problem Relation Age of Onset  . Cancer Mother   . COPD Father   . Asthma Brother   . Cancer Brother     History  Substance Use Topics  . Smoking status: Never Smoker   . Smokeless tobacco: Not on file  . Alcohol Use: Yes     Occasional    OB History    Grav Para Term Preterm Abortions TAB SAB Ect Mult Living                  Review of Systems All other systems negative except as documented in the HPI. All pertinent positives and negatives as reviewed in the HPI.  Allergies  Tylenol; Amoxicillin; and Amoxicillin  Home Medications   Current Outpatient Rx  Name Route Sig Dispense Refill  . PROAIR HFA IN Inhalation Inhale 1-2 puffs into the lungs every 4 (four) hours as needed. Shortness of breath or wheezing    .  ALPRAZOLAM 0.5 MG PO TABS Oral Take 0.5 mg by mouth 3 (three) times daily as needed. anxiety    . CYANOCOBALAMIN 1000 MCG/ML IJ SOLN Intramuscular Inject 1,000 mcg into the muscle every 3 (three) months.     . DESLORATADINE 5 MG PO TABS Oral Take 1 tablet (5 mg total) by mouth daily. 30 tablet 0  . DIPHENHYDRAMINE HCL 25 MG PO CAPS Oral Take 25 mg by mouth at bedtime as needed. Chronic illness, itching    . EPINEPHRINE 0.3 MG/0.3ML IJ DEVI  For severe allergic reaction 1 Device prn  . ESCITALOPRAM OXALATE 20 MG PO TABS Oral Take 20 mg by mouth daily.      [name] FLUTICASONE PROPIONATE 50 MCG/ACT NA SUSP Nasal Place 2 sprays into the nose daily as needed. For allergy    . FLUTICASONE-SALMETEROL 250-50 MCG/DOSE IN AEPB  1 puff, twice daily- rinse mouth well after use 60 each prn  . GABAPENTIN 300 MG PO CAPS Oral Take 300 mg by mouth 3 (three) times daily.     [name] HYDROCODONE-ACETAMINOPHEN 5-325 MG PO TABS  every 6 (six) hours as needed.     . IBUPROFEN 600 MG PO TABS Oral Take 600 mg by mouth every 6 (six) hours as needed. For pain    .  LEVOCETIRIZINE DIHYDROCHLORIDE 5 MG PO TABS Oral Take 1 tablet (5 mg total) by mouth every evening. 30 tablet 0  . MONTELUKAST SODIUM 10 MG PO TABS Oral Take 10 mg by mouth at bedtime.      [name] PREDNISONE 10 MG PO KIT Oral Take by mouth. Tapering down each day. Pack of 21 pills.      BP 143/81  Pulse 166  Temp 98.9 F (37.2 C) (Oral)  Resp 24  SpO2 97%  Physical Exam  Constitutional: She is oriented to person, place, and time. She appears well-developed and well-nourished. She appears distressed.  HENT:  Head: Normocephalic and atraumatic.  Eyes: Pupils are equal, round, and reactive to light.  Neck: Normal range of motion. Neck supple.  Cardiovascular: Regular rhythm and normal heart sounds.  Tachycardia present.  Exam reveals no gallop and no friction rub.   No murmur heard. Pulmonary/Chest: Effort normal and breath sounds normal. No respiratory distress. She  has no wheezes. She has no rales.  Neurological: She is alert and oriented to person, place, and time.  Skin: Skin is warm. No rash noted. She is diaphoretic.    ED Course  Procedures (including critical care time) I have closely monitored the patient and she has improved with fluids and pain control. The patient will be admitted to the hospital for further care and eval. The patient seems to tense up and jerk when I come into the room.   MDM  MDM Reviewed: nursing note, vitals and previous chart Interpretation: labs and ECG Consults: admitting MD   Date: 08/27/2012  Rate: 129  Rhythm: sinus tachycardia  QRS Axis: normal  Intervals: normal  ST/T Wave abnormalities: normal  Conduction Disutrbances:none  Narrative Interpretation:   Old EKG Reviewed: none available and unchanged            Carlyle Dolly, PA-C 08/27/12 1626

## 2012-08-27 NOTE — H&P (Addendum)
PCP:   Gwynneth Aliment, MD   Chief Complaint:  Back pains, muscle spasms x 4 days.   HPI: This is a 46 year old female, with known history of L5-S1 disk HNP 2007, chronic back pain since then, s/p left great toe bunionectomy 2004, allergic rhinitis, pernicious anemia with intrinsic factor antibodies, bronchial asthma, previous angioedema/urticaria, RLE DVT 3 years ago, s/p previous hernia repair, presenting with acute flare up of back pain, accompanied by muscle spasms. According to patient, since her disk rupture in 2007, she has had episodes of severe back spasm, which occur periodically, then subside. Surgery has been discussed with her primary orthopedic surgeon, [name], and is yet to be scheduled. She woke up in AM of 8/130/13, ,had an acute onset of low back pain and muscle spasms in her low back, and this has been recurrent, since, with each episode lasting 1-5 minutes, and at varying intervals, associated with pain radiating into both lower extremities, exacerbated by attempts at movement, and planter flexion of left great toe. This is has left her quite incapacitated, and she has not been able to eat or drink much. As a matter of fact, after hours on 08/23/12, she placed a call to [name], and was requested to call back on 08/27/12, to schedule surgery. Because of severe symptoms today, she called EMS, and came to the ED.   Allergies:   Allergies  Allergen Reactions  . Tylenol (Acetaminophen) Hives  . Amoxicillin Diarrhea  . Amoxicillin Diarrhea      Past Medical History  Diagnosis Date  . Pernicious anemia 10/30/2011  . Lumbar herniated disc   . Environmental allergies   . Angioedema   . DVT (deep venous thrombosis)   . Asthma   . Seasonal allergies   . Urticaria   . Chronic back pain     Past Surgical History  Procedure Date  . Hernia repair     Prior to Admission medications   Medication Sig Start Date End Date Taking? Authorizing Provider  Albuterol Sulfate (PROAIR  HFA IN) Inhale 2 puffs into the lungs every 4 (four) hours as needed. Shortness of breath or wheezing   Yes Historical Provider, MD  ALPRAZolam (XANAX) 0.5 MG tablet Take 0.5 mg by mouth 3 (three) times daily as needed. anxiety   Yes Historical Provider, MD  cyanocobalamin (,VITAMIN B-12,) 1000 MCG/ML injection Inject 1,000 mcg into the muscle every 3 (three) months.    Yes Historical Provider, MD  desloratadine (CLARINEX) 5 MG tablet Take 1 tablet (5 mg total) by mouth daily. 11/06/11  Yes Waymon Budge, MD  EPINEPHrine (EPI-PEN) 0.3 mg/0.3 mL DEVI Inject 0.3 mg into the muscle as needed. For life threatening allergic reaction   Yes Historical Provider, MD  escitalopram (LEXAPRO) 20 MG tablet Take 20 mg by mouth daily.     Yes Historical Provider, MD  fluticasone (FLONASE) 50 MCG/ACT nasal spray Place 2 sprays into the nose daily as needed. For allergy 11/24/11 11/23/12 Yes Luiz Blare, MD  Fluticasone-Salmeterol (ADVAIR) 250-50 MCG/DOSE AEPB Inhale 2 puffs into the lungs daily. 1 puff, twice daily- rinse mouth well after use 03/14/12  Yes Clinton D Young, MD  gabapentin (NEURONTIN) 300 MG capsule Take 300 mg by mouth 3 (three) times daily.    Yes Historical Provider, MD  HYDROcodone-acetaminophen (NORCO) 5-325 MG per tablet Take 1-2 tablets by mouth every 6 (six) hours as needed. For pain 04/19/12  Yes Historical Provider, MD  ibuprofen (ADVIL,MOTRIN) 600 MG tablet  Take 600 mg by mouth every 6 (six) hours as needed. For pain   Yes Historical Provider, MD  levocetirizine (XYZAL) 5 MG tablet Take 1 tablet (5 mg total) by mouth every evening. 11/06/11  Yes Waymon Budge, MD  montelukast (SINGULAIR) 10 MG tablet Take 10 mg by mouth at bedtime.     Yes Historical Provider, MD  naproxen sodium (ANAPROX) 220 MG tablet Take 440 mg by mouth every 12 (twelve) hours.   Yes Historical Provider, MD    Social History: Patient is divorced over 20 years, has one daughter, and has been off work since  march 2013, on doctor's orders. She reports that she has never smoked. She does not have any smokeless tobacco history on file. She reports that she drinks alcohol. She reports that she does not use illicit drugs.  Family History  Problem Relation Age of Onset  . Cancer Mother   . COPD Father   . Asthma Brother   . Cancer Brother     Review of Systems:  As per HPI and chief complaint. Patent has fatigue, diminished appetite, denies weight loss, fever, chills, headache, blurred vision, difficulty in speaking, dysphagia, chest pain, cough, shortness of breath, orthopnea, paroxysmal nocturnal dyspnea, nausea, diaphoresis, abdominal pain, vomiting, diarrhea, belching, heartburn, hematemesis, melena, dysuria, nocturia, urinary frequency, hematochezia, lower extremity swelling, pain, or redness. Last bowel movement was on 08/25/12. The rest of the systems review is negative.  Physical Exam:  General:  Patient appears to be in obvious acute distress, due to recurrent back spasms, on the slightest movent. Alert, communicative, fully oriented, talking in complete sentences, not short of breath at rest.  HEENT:  Mild clinical pallor, no jaundice, no conjunctival injection or discharge. Visible buccal mucosa is "dry".  NECK:  Supple, JVP not seen, no carotid bruits, no palpable lymphadenopathy, no palpable goiter. CHEST:  Clinically clear to auscultation, no wheezes, no crackles. HEART:  Sounds 1 and 2 heard, normal, regular, no murmurs. ABDOMEN:  Moderately obese, soft, non-tender, no palpable organomegaly, no palpable masses, normal bowel sounds. GENITALIA:  Not examined. LOWER EXTREMITIES:  No pitting edema, palpable peripheral pulses. MUSCULOSKELETAL SYSTEM:  Has unremarkable bunionectomy scar left great toe. Straight-leg raising test is mrkedly positive bilaterally, left>right, and patient has marked lumbar tenderness to pressure, as well as paraspinal muscle spasm. . CENTRAL NERVOUS SYSTEM:  No  focal neurologic deficit on gross examination.  Labs on Admission:  Results for orders placed during the hospital encounter of 08/27/12 (from the past 48 hour(s))  CBC WITH DIFFERENTIAL     Status: Abnormal   Collection Time   08/27/12 11:48 AM      Component Value Range Comment   WBC 22.8 (*) 4.0 - 10.5 K/uL    RBC 3.97  3.87 - 5.11 MIL/uL    Hemoglobin 11.0 (*) 12.0 - 15.0 g/dL    HCT 86.5 (*) 78.4 - 46.0 %    MCV 83.1  78.0 - 100.0 fL    MCH 27.7  26.0 - 34.0 pg    MCHC 33.3  30.0 - 36.0 g/dL    RDW 69.6  29.5 - 28.4 %    Platelets 248  150 - 400 K/uL    Neutrophils Relative 92 (*) 43 - 77 %    Neutro Abs 20.8 (*) 1.7 - 7.7 K/uL    Lymphocytes Relative 3 (*) 12 - 46 %    Lymphs Abs 0.6 (*) 0.7 - 4.0 K/uL    Monocytes Relative 6  3 - 12 %    Monocytes Absolute 1.3 (*) 0.1 - 1.0 K/uL    Eosinophils Relative 0  0 - 5 %    Eosinophils Absolute 0.0  0.0 - 0.7 K/uL    Basophils Relative 0  0 - 1 %    Basophils Absolute 0.0  0.0 - 0.1 K/uL   BASIC METABOLIC PANEL     Status: Abnormal   Collection Time   08/27/12 11:48 AM      Component Value Range Comment   Sodium 153 (*) 135 - 145 mEq/L    Potassium 3.0 (*) 3.5 - 5.1 mEq/L    Chloride 114 (*) 96 - 112 mEq/L    CO2 19  19 - 32 mEq/L    Glucose, Bld 63 (*) 70 - 99 mg/dL    BUN 22  6 - 23 mg/dL    Creatinine, Ser 8.41 (*) 0.50 - 1.10 mg/dL    Calcium 9.4  8.4 - 32.4 mg/dL    GFR calc non Af Amer 27 (*) >90 mL/min    GFR calc Af Amer 32 (*) >90 mL/min   CK     Status: Abnormal   Collection Time   08/27/12  1:42 PM      Component Value Range Comment   Total CK 5632 (*) 7 - 177 U/L   LACTIC ACID, PLASMA     Status: Normal   Collection Time   08/27/12  1:42 PM      Component Value Range Comment   Lactic Acid, Venous 1.0  0.5 - 2.2 mmol/L   URINALYSIS, ROUTINE W REFLEX MICROSCOPIC     Status: Abnormal   Collection Time   08/27/12  2:33 PM      Component Value Range Comment   Color, Urine YELLOW  YELLOW    APPearance CLOUDY (*)  CLEAR    Specific Gravity, Urine 1.021  1.005 - 1.030    pH 5.5  5.0 - 8.0    Glucose, UA NEGATIVE  NEGATIVE mg/dL    Hgb urine dipstick LARGE (*) NEGATIVE    Bilirubin Urine NEGATIVE  NEGATIVE    Ketones, ur 40 (*) NEGATIVE mg/dL    Protein, ur 401 (*) NEGATIVE mg/dL    Urobilinogen, UA 1.0  0.0 - 1.0 mg/dL    Nitrite NEGATIVE  NEGATIVE    Leukocytes, UA SMALL (*) NEGATIVE   URINE MICROSCOPIC-ADD ON     Status: Abnormal   Collection Time   08/27/12  2:33 PM      Component Value Range Comment   Squamous Epithelial / LPF FEW (*) RARE    WBC, UA 3-6  <3 WBC/hpf    RBC / HPF 0-2  <3 RBC/hpf    Bacteria, UA RARE  RARE    Casts HYALINE CASTS (*) NEGATIVE GRANULAR CAST   Urine-Other MUCOUS PRESENT       Radiological Exams on Admission: No results found.  Assessment/Plan Active Problems: 1. Acute back pain/Radiculopathy: Patient presents with an acute flare of her chronic low back pain, associated with paraspinal muscle spasm and bilateral LE radiculopathy. We shall admit patient, manage with analgesics, muscle relaxants and obtain lumbar MRI to define anatomy and rule out other pathology, as wcc is elevated. Clearly, she will benefit from orthopedic consultation, for possible definitive intervention, given severity of her symptoms. We shall consult Dr Aleene Davidson al, as surgery has already been discussed with patient. 2. Rhabdomyolysis: Patient has a mild-moderate rhabdomyolysis, as evidenced by a CK of  [id number], and ARF. No doubt, this is due to her recurrent muscle spasms,as described above. We shall manage with iv aggressive fluids, and follow CK levels.  3.  ARF (acute renal failure): Patient has a creatinine of 2.07, against a known baseline creatinine of 0.83 on 01/09/12, consistent with ARF. This is secondary to rhabdomyolysis, dehydration, as well as the super-added effects of ACE-i and NSAID therapy. These nephrotoxins will be avoided, and patient managed with iv fluids.  4. Hypokalemia: Will  replete as indicated, and follow lytes. We shall also check magnesium levels for completeness.  5. Asthma: Stable/asymptomatic at this time. Will manage with bronchodilators.  6. Pernicious anemia: Patient has a known history of vitamin B12 deficiency, due to intrinsic factor antibody-positve pernicious anemia,and is on 71-monthly B12 injections. HB appears reasonable at this time.   Further management will depend on clinical course.   Comment: Patient is FULL CODE.   Time Spent on Admission: 1 hour.   Gerrit Rafalski,CHRISTOPHER 08/27/2012, 3:59 PM

## 2012-08-27 NOTE — ED Notes (Signed)
Spoke with Dr. Brien Few regarding inpatient bed placement. Verbal order given to change bed assignment to telemetry. Order placed. Currently waiting on new bed assignment. Will continue to monitor patient.

## 2012-08-27 NOTE — ED Notes (Signed)
Admitting MD at bedside.

## 2012-08-27 NOTE — ED Notes (Signed)
C/o low back pain since Friday. Pt with whole body jerking, states "it's spasms". Denies injury

## 2012-08-28 ENCOUNTER — Inpatient Hospital Stay (HOSPITAL_COMMUNITY)

## 2012-08-28 DIAGNOSIS — IMO0002 Reserved for concepts with insufficient information to code with codable children: Secondary | ICD-10-CM

## 2012-08-28 DIAGNOSIS — N390 Urinary tract infection, site not specified: Secondary | ICD-10-CM | POA: Diagnosis present

## 2012-08-28 DIAGNOSIS — D72829 Elevated white blood cell count, unspecified: Secondary | ICD-10-CM | POA: Diagnosis present

## 2012-08-28 DIAGNOSIS — E872 Acidosis, unspecified: Secondary | ICD-10-CM | POA: Diagnosis present

## 2012-08-28 DIAGNOSIS — D649 Anemia, unspecified: Secondary | ICD-10-CM | POA: Diagnosis present

## 2012-08-28 DIAGNOSIS — E870 Hyperosmolality and hypernatremia: Secondary | ICD-10-CM | POA: Diagnosis present

## 2012-08-28 LAB — COMPREHENSIVE METABOLIC PANEL
ALT: 67 U/L — ABNORMAL HIGH (ref 0–35)
AST: 337 U/L — ABNORMAL HIGH (ref 0–37)
BUN: 18 mg/dL (ref 6–23)
CO2: 14 mEq/L — ABNORMAL LOW (ref 19–32)
Calcium: 8.9 mg/dL (ref 8.4–10.5)
Creatinine, Ser: 1.32 mg/dL — ABNORMAL HIGH (ref 0.50–1.10)
GFR calc Af Amer: 55 mL/min — ABNORMAL LOW (ref >90)
GFR calc non Af Amer: 47 mL/min — ABNORMAL LOW (ref >90)
Glucose, Bld: 63 mg/dL — ABNORMAL LOW (ref 70–99)
Sodium: 151 mEq/L — ABNORMAL HIGH (ref 135–145)
Total Bilirubin: 0.3 mg/dL (ref 0.3–1.2)
Total Protein: 6.8 g/dL (ref 6.0–8.3)

## 2012-08-28 LAB — CBC
Hemoglobin: 10.7 g/dL — ABNORMAL LOW (ref 12.0–15.0)
MCH: 27.6 pg (ref 26.0–34.0)
MCHC: 32.2 g/dL (ref 30.0–36.0)
MCV: 85.8 fL (ref 78.0–100.0)
RDW: 13.2 % (ref 11.5–15.5)

## 2012-08-28 LAB — MAGNESIUM: Magnesium: 2.1 mg/dL (ref 1.5–2.5)

## 2012-08-28 LAB — CK: Total CK: 20337 U/L — ABNORMAL HIGH (ref 7–177)

## 2012-08-28 LAB — PROCALCITONIN: Procalcitonin: 37.24 ng/mL

## 2012-08-28 MED ORDER — HYDROMORPHONE HCL PF 1 MG/ML IJ SOLN
1.0000 mg | INTRAMUSCULAR | Status: DC | PRN
  Administered 2012-08-28 – 2012-09-02 (×12): 1 mg via INTRAVENOUS
  Filled 2012-08-28 (×12): qty 1

## 2012-08-28 MED ORDER — SODIUM BICARBONATE 8.4 % IV SOLN
INTRAVENOUS | Status: DC
  Administered 2012-08-28: 19:00:00 via INTRAVENOUS
  Filled 2012-08-28 (×5): qty 50

## 2012-08-28 MED ORDER — DEXTROSE 5 % IV SOLN
INTRAVENOUS | Status: DC
  Administered 2012-08-29: 08:00:00 via INTRAVENOUS

## 2012-08-28 MED ORDER — STERILE WATER FOR INJECTION IV SOLN: INTRAVENOUS | Status: DC

## 2012-08-28 MED ORDER — METHOCARBAMOL 500 MG PO TABS
500.0000 mg | ORAL_TABLET | Freq: Four times a day (QID) | ORAL | Status: DC
  Administered 2012-08-28 – 2012-09-02 (×20): 500 mg via ORAL
  Filled 2012-08-28 (×22): qty 1

## 2012-08-28 MED ORDER — METHOCARBAMOL 100 MG/ML IJ SOLN
500.0000 mg | Freq: Three times a day (TID) | INTRAVENOUS | Status: DC | PRN
  Filled 2012-08-28: qty 5

## 2012-08-28 MED ORDER — POLYETHYLENE GLYCOL 3350 17 G PO PACK
17.0000 g | PACK | Freq: Every day | ORAL | Status: DC | PRN
  Administered 2012-09-04: 17 g via ORAL
  Filled 2012-08-28 (×2): qty 1

## 2012-08-28 MED ORDER — LEVOFLOXACIN IN D5W 500 MG/100ML IV SOLN
500.0000 mg | INTRAVENOUS | Status: DC
  Administered 2012-08-28 – 2012-08-31 (×4): 500 mg via INTRAVENOUS
  Filled 2012-08-28 (×4): qty 100

## 2012-08-28 MED ORDER — METHOCARBAMOL 500 MG PO TABS: 500.0000 mg | ORAL_TABLET | Freq: Three times a day (TID) | ORAL | Status: DC

## 2012-08-28 MED ORDER — CYCLOBENZAPRINE HCL 5 MG PO TABS
5.0000 mg | ORAL_TABLET | Freq: Three times a day (TID) | ORAL | Status: DC | PRN
  Administered 2012-08-29 – 2012-09-02 (×7): 5 mg via ORAL
  Filled 2012-08-28 (×8): qty 1
  Filled 2012-08-28: qty 2
  Filled 2012-08-28: qty 1

## 2012-08-28 NOTE — Progress Notes (Addendum)
[facility] PROGRESS NOTE  Sherri [name] ZOX:[id number] DOB: [birth year] DOA: 08/27/2012 PCP: Gwynneth Aliment, MD  Assessment/Plan: Active Problems:  Acute back pain  Rhabdomyolysis  ARF (acute renal failure)  Hypokalemia  Asthma  1. Acute back pain/Radiculopathy: Patient presents with an acute flare of her chronic low back pain, associated with paraspinal muscle spasm and bilateral LE radiculopathy. We shall admit patient, manage with analgesics, muscle relaxants and obtain lumbar MRI to define anatomy and rule out other pathology, as wcc is elevated. Clearly, she will benefit from orthopedic consultation, for possible definitive intervention, given severity of her symptoms. Started her on robaxin and flexeril. Awaiting [name] recommendations. MRI of the lumbar spine pending.   2. Rhabdomyolysis: Patient has a moderate rhabdomyolysis, as evidenced by [place] of 81[address], and ARF. No doubt, this is due to her recurrent muscle spasms,as described above. We shall manage with iv aggressive fluids, and follow CK levels. RENAL consult called.   3. ARF (acute renal failure): Patient came in with a creatinine of 2.07, against a known baseline creatinine of 0.83 on 01/09/12, consistent with ARF. This is secondary to rhabdomyolysis, dehydration, as well as the super-added effects of ACE-i and NSAID therapy. These nephrotoxins will be avoided, and patient managed with iv fluids. Today creatinine is 1.3. Repeat lAbs in am. 4. Hypokalemia: Will replete as indicated, and follow lytes.   5. Asthma: Stable/asymptomatic at this time. Will manage with bronchodilators.   6. Pernicious anemia: Patient has a known history of vitamin B12 deficiency, due to intrinsic factor antibody-positve pernicious anemia,and is on 84-monthly B12 injections. HB appears reasonable at this time.  7. Leukocytosis: ? Reactive vs infection. UA is abnormal. Started her on IV levaquin. CXR is pending. MRI of the lumbar spine is  pending. Will get a pro calcitonin level.  8. Hypernatremia: probably secondary to dehydration. Will stop Normal saline fluids and start her on dextrose 5 fluid infusion at 75 ml/hr.   9. Tachycardia: possibly from the back pain. Pain control for now. TELE monitor shows sinus tachycardia.   Further management will depend on clinical course.    Code Status:FULL CODE Family Communication: none at bedside Disposition Plan: pending, PT/OT eval.   Brief narrative:  This is a 46 year old female, with known history of L5-S1 disk HNP 2007, chronic back pain since then, s/p left great toe bunionectomy 2004, allergic rhinitis, pernicious anemia with intrinsic factor antibodies, bronchial asthma, previous angioedema/urticaria, RLE DVT 3 years ago, s/p previous hernia repair, presenting with acute flare up of back pain, accompanied by muscle spasms. According to patient, since her disk rupture in 2007, she has had episodes of severe back spasm, which occur periodically, then subside. Surgery has been discussed with her primary orthopedic surgeon, [name], and is yet to be scheduled. She woke up in AM of 8/130/13, ,had an acute onset of low back pain and muscle spasms in her low back, and this has been recurrent, since, with each episode lasting 1-5 minutes, and at varying intervals, associated with pain radiating into both lower extremities, exacerbated by attempts at movement, and planter flexion of left great toe. This is has left her quite incapacitated, and she has not been able to eat or drink much. As a matter of fact, after hours on 08/23/12, she placed a call to [name], and was requested to call back on 08/27/12, to schedule surgery. Because of severe symptoms today, she called EMS, and came to the ED.   Consultants:  Dr  Ophelia Charter For orthopedics  Nephrology consult for rhab/ARF.  Procedures:  Mri of the lumbar spine pending  Antibiotics: levaquin. HPI/Subjective: Having frequent  spasms.  Objective: Filed Vitals:   08/27/12 1800 08/27/12 1900 08/27/12 2145 08/28/12 0532  BP: 117/67 127/73 134/86 126/74  Pulse: 127 124 119 115  Temp:   98 F (36.7 C) 97.3 F (36.3 C)  TempSrc:      Resp: 24 25 23 25   SpO2: 100% 100% 96% 100%    Intake/Output Summary (Last 24 hours) at 08/28/12 0852 Last data filed at 08/28/12 0500  Gross per 24 hour  Intake   2000 ml  Output   1600 ml  Net    400 ml   There were no vitals filed for this visit.  Exam:   General:  Alert afebrile appears to be in mild distress from the back spasms  Cardiovascular: s1s2, tachycardic  Respiratory: CTAB  Abdomen: soft NT ND BS+  Back: paraspinal muscle spasm, lumbar tenderness.   Data Reviewed: Basic Metabolic Panel:  Lab 08/28/12 4098 08/27/12 1726 08/27/12 1148  NA 151* -- 153*  K 4.0 -- 3.0*  CL 119* -- 114*  CO2 14* -- 19  GLUCOSE 63* -- 63*  BUN 18 -- 22  CREATININE 1.32* -- 2.07*  CALCIUM 8.9 -- 9.4  MG 2.1 2.2 --  PHOS -- -- --   Liver Function Tests:  Lab 08/28/12 0630  AST 337*  ALT 67*  ALKPHOS 85  BILITOT 0.3  PROT 6.8  ALBUMIN 3.5   No results found for this basename: LIPASE:5,AMYLASE:5 in the last 168 hours No results found for this basename: AMMONIA:5 in the last 168 hours CBC:  Lab 08/28/12 0630 08/27/12 1148  WBC 24.9* 22.8*  NEUTROABS -- 20.8*  HGB 10.7* 11.0*  HCT 33.2* 33.0*  MCV 85.8 83.1  PLT 225 248   Cardiac Enzymes:  Lab 08/27/12 1342  CKTOTAL 5632*  CKMB --  CKMBINDEX --  TROPONINI --   BNP (last 3 results) No results found for this basename: PROBNP:3 in the last 8760 hours CBG: No results found for this basename: GLUCAP:5 in the last 168 hours  No results found for this or any previous visit (from the past 240 hour(s)).   Studies: No results found.  Scheduled Meds:   . diazepam  5 mg Intravenous Once  . docusate sodium  100 mg Oral BID  . escitalopram  20 mg Oral Daily  . Fluticasone-Salmeterol  1 puff  Inhalation BID  . gabapentin  300 mg Oral TID  .  HYDROmorphone (DILAUDID) injection  1 mg Intravenous Once  . loratadine  10 mg Oral Daily  . LORazepam  1 mg Intravenous Once  . methocarbamol  500 mg Oral TID  . montelukast  10 mg Oral QHS  .  morphine injection  4 mg Intravenous Once  . potassium chloride  40 mEq Oral Once  . sodium chloride  1,000 mL Intravenous Once  . sodium chloride  1,000 mL Intravenous Once  . sodium chloride  1,000 mL Intravenous Once  . sodium chloride  3 mL Intravenous Q12H  . DISCONTD:  HYDROmorphone (DILAUDID) injection  1 mg Intramuscular Once  . DISCONTD: methocarbamol (ROBAXIN) IV  500 mg Intravenous Q8H   Continuous Infusions:   . sodium chloride      Active Problems:  Acute back pain  Rhabdomyolysis  ARF (acute renal failure)  Hypokalemia  Asthma       Sherri [name]  [facility] Pager  [phone]. If 8PM-8AM, please contact night-coverage at [email or web address], password [facility] 08/28/2012, 8:52 AM  LOS: 1 day

## 2012-08-28 NOTE — Progress Notes (Signed)
Utilization review completed. Heavenleigh Petruzzi, RN, BSN. 

## 2012-08-28 NOTE — Consult Note (Signed)
Reason for Consult: acute exacerbation of chronic low back pain and radiculopathy Referring Physician: [facility]  Sherri [name] is an 46 y.o. female.  HPI: Pt well known to [name] practice with more than one year history of right leg pain and low back pain. Seen in office in May 2013 after MRI demonstrated large paracentral disc rupture at L5-S1 with lateral recess stenosis and S1 nerve root compression.  Also a small disc herniation at L4-5 right without significant nerve root compression.  At that time her clinical symptoms were positive for popliteal compression test and positive straight leg raise on the right.  Weakness with heel walking on the right, but able to toe walk,without other focal weakness.  She was advised that surgery was indicated and was placed on the schedule for microdiscectomy right L5-S1.  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 Claim and was not approved and pt cancelled the surgery.  Last seen in the office July 23,2013 and back and leg pain improved and pt desired to wait for surgery.  She has received meds from our office for Norco and Neurontin.  She has been taking Xanax prescribed by her PCP (?Lefav).  Pt related this am that she had been taking her Xanax tid as opposed to the Prescribed bid and ran out about 5 days ago.  She called her PCP who would not refill the rx as it was too soon for a refill.  She attempted to obtain the med through [name] office but was unable to get it.  She reports the med is for her anxiety and depression, but she was taking extra for the pain in her back.  She reports she had no other pain meds to take and her back pain increased to the point that she could not tolerate it at home.  She presented to the ED for treatment.  No new injury.  No bowel or bladder incontinence.  Symptoms are similar to her chronic symptoms.    This am MRI scans in the computer reviewed and pt actually had significant improvement  in the finding from the scan of  10/11 compared to the one in May 2013. Her exam today is with +SLR right LE.  No focal weakness in the LEs.  She is jerking her arms and legs and shaking spontaneously during the conversation. Moving in bed without assistance.  Past Medical History  Diagnosis Date  . Pernicious anemia 10/30/2011  . Lumbar herniated disc   . Environmental allergies   . Angioedema   . DVT (deep venous thrombosis)   . Asthma   . Seasonal allergies   . Urticaria   . Chronic back pain   . Anxiety   . Depression   . GERD (gastroesophageal reflux disease)   . Arthritis   . Neuromuscular disorder   . Chronic kidney disease   . Peripheral vascular disease     Past Surgical History  Procedure Date  . Hernia repair   . Inguinal hernia 1983 1983    Family History  Problem Relation Age of Onset  . Cancer Mother   . Other Mother   . COPD Father   . Asthma Brother   . Cancer Brother     Social History:  reports that she has never smoked. She does not have any smokeless tobacco history on file. She reports that she does not drink alcohol or use illicit drugs.  Allergies:  Allergies  Allergen Reactions  . Tylenol (Acetaminophen) Hives  .  Amoxicillin Diarrhea  . Amoxicillin Diarrhea    Medications: I have reviewed the patient's current medications.  Results for orders placed during the hospital encounter of 08/27/12 (from the past 48 hour(s))  CBC WITH DIFFERENTIAL     Status: Abnormal   Collection Time   08/27/12 11:48 AM      Component Value Range Comment   WBC 22.8 (*) 4.0 - 10.5 K/uL    RBC 3.97  3.87 - 5.11 MIL/uL    Hemoglobin 11.0 (*) 12.0 - 15.0 g/dL    HCT 11.9 (*) 14.7 - 46.0 %    MCV 83.1  78.0 - 100.0 fL    MCH 27.7  26.0 - 34.0 pg    MCHC 33.3  30.0 - 36.0 g/dL    RDW 82.9  56.2 - 13.0 %    Platelets 248  150 - 400 K/uL    Neutrophils Relative 92 (*) 43 - 77 %    Neutro Abs 20.8 (*) 1.7 - 7.7 K/uL    Lymphocytes Relative 3 (*) 12 - 46 %    Lymphs Abs 0.6 (*) 0.7 - 4.0 K/uL      Monocytes Relative 6  3 - 12 %    Monocytes Absolute 1.3 (*) 0.1 - 1.0 K/uL    Eosinophils Relative 0  0 - 5 %    Eosinophils Absolute 0.0  0.0 - 0.7 K/uL    Basophils Relative 0  0 - 1 %    Basophils Absolute 0.0  0.0 - 0.1 K/uL   BASIC METABOLIC PANEL     Status: Abnormal   Collection Time   08/27/12 11:48 AM      Component Value Range Comment   Sodium 153 (*) 135 - 145 mEq/L    Potassium 3.0 (*) 3.5 - 5.1 mEq/L    Chloride 114 (*) 96 - 112 mEq/L    CO2 19  19 - 32 mEq/L    Glucose, Bld 63 (*) 70 - 99 mg/dL    BUN 22  6 - 23 mg/dL    Creatinine, Ser 8.65 (*) 0.50 - 1.10 mg/dL    Calcium 9.4  8.4 - 78.4 mg/dL    GFR calc non Af Amer 27 (*) >90 mL/min    GFR calc Af Amer 32 (*) >90 mL/min   CK     Status: Abnormal   Collection Time   08/27/12  1:42 PM      Component Value Range Comment   Total CK 5632 (*) 7 - 177 U/L   LACTIC ACID, PLASMA     Status: Normal   Collection Time   08/27/12  1:42 PM      Component Value Range Comment   Lactic Acid, Venous 1.0  0.5 - 2.2 mmol/L   URINALYSIS, ROUTINE W REFLEX MICROSCOPIC     Status: Abnormal   Collection Time   08/27/12  2:33 PM      Component Value Range Comment   Color, Urine YELLOW  YELLOW    APPearance CLOUDY (*) CLEAR    Specific Gravity, Urine 1.021  1.005 - 1.030    pH 5.5  5.0 - 8.0    Glucose, UA NEGATIVE  NEGATIVE mg/dL    Hgb urine dipstick LARGE (*) NEGATIVE    Bilirubin Urine NEGATIVE  NEGATIVE    Ketones, ur 40 (*) NEGATIVE mg/dL    Protein, ur 696 (*) NEGATIVE mg/dL    Urobilinogen, UA 1.0  0.0 - 1.0 mg/dL    Nitrite NEGATIVE  NEGATIVE    Leukocytes, UA SMALL (*) NEGATIVE   URINE MICROSCOPIC-ADD ON     Status: Abnormal   Collection Time   08/27/12  2:33 PM      Component Value Range Comment   Squamous Epithelial / LPF FEW (*) RARE    WBC, UA 3-6  <3 WBC/hpf    RBC / HPF 0-2  <3 RBC/hpf    Bacteria, UA RARE  RARE    Casts HYALINE CASTS (*) NEGATIVE GRANULAR CAST   Urine-Other MUCOUS PRESENT     MAGNESIUM      Status: Normal   Collection Time   08/27/12  5:26 PM      Component Value Range Comment   Magnesium 2.2  1.5 - 2.5 mg/dL   CBC     Status: Abnormal   Collection Time   08/28/12  6:30 AM      Component Value Range Comment   WBC 24.9 (*) 4.0 - 10.5 K/uL    RBC 3.87  3.87 - 5.11 MIL/uL    Hemoglobin 10.7 (*) 12.0 - 15.0 g/dL    HCT 40.9 (*) 81.1 - 46.0 %    MCV 85.8  78.0 - 100.0 fL    MCH 27.6  26.0 - 34.0 pg    MCHC 32.2  30.0 - 36.0 g/dL    RDW 91.4  78.2 - 95.6 %    Platelets 225  150 - 400 K/uL   COMPREHENSIVE METABOLIC PANEL     Status: Abnormal   Collection Time   08/28/12  6:30 AM      Component Value Range Comment   Sodium 151 (*) 135 - 145 mEq/L    Potassium 4.0  3.5 - 5.1 mEq/L DELTA CHECK NOTED   Chloride 119 (*) 96 - 112 mEq/L    CO2 14 (*) 19 - 32 mEq/L    Glucose, Bld 63 (*) 70 - 99 mg/dL    BUN 18  6 - 23 mg/dL    Creatinine, Ser 2.13 (*) 0.50 - 1.10 mg/dL DELTA CHECK NOTED   Calcium 8.9  8.4 - 10.5 mg/dL    Total Protein 6.8  6.0 - 8.3 g/dL    Albumin 3.5  3.5 - 5.2 g/dL    AST 086 (*) 0 - 37 U/L    ALT 67 (*) 0 - 35 U/L    Alkaline Phosphatase 85  39 - 117 U/L    Total Bilirubin 0.3  0.3 - 1.2 mg/dL    GFR calc non Af Amer 47 (*) >90 mL/min    GFR calc Af Amer 55 (*) >90 mL/min   CK     Status: Abnormal   Collection Time   08/28/12  6:30 AM      Component Value Range Comment   [place] [address] (*) 7 - 177 U/L RESULTS CONFIRMED BY MANUAL DILUTION  MAGNESIUM     Status: Normal   Collection Time   08/28/12  6:30 AM      Component Value Range Comment   Magnesium 2.1  1.5 - 2.5 mg/dL   PROCALCITONIN     Status: Normal   Collection Time   08/28/12  6:30 AM      Component Value Range Comment   Procalcitonin 37.24      rirgh Dg Chest 2 View  08/28/2012  *RADIOLOGY REPORT*  Clinical Data: Short of breath.  Leukocytosis.  CHEST - 2 VIEW  Comparison: 05/17/2009  Findings: Cardiac enlargement may be due to projection.  The current study is significantly lordotic in  positioning and follow- up study is suggested.  Left lower lobe airspace disease is present.  This may represent atelectasis or pneumonia.  This was not present previously. Retrocardiac gas shadow may represent a lung abscess.  This contains an air-fluid level and  was not present previously.  Negative for pleural effusion.  IMPRESSION: Left lower lobe airspace disease may represent atelectasis or infiltrate.  Pneumonia is possible.  Lordotic positioning on the study.  Follow-up chest x-ray suggested.  Air-fluid level left lower lobe may represent a lung abscess or possibly a hiatal hernia.   Original Report Authenticated By: Camelia Phenes, [name].D.     Review of Systems  Constitutional: Positive for malaise/fatigue.  HENT: Negative.   Eyes: Negative.   Respiratory: Negative.   Cardiovascular: Negative.   Gastrointestinal: Negative.   Genitourinary: Negative.   Musculoskeletal: Positive for back pain.  Skin: Negative.   Neurological: Positive for tremors.  Endo/Heme/Allergies: Negative.   Psychiatric/Behavioral: Negative.    Blood pressure 125/56, pulse 119, temperature 98.2 F (36.8 C), temperature source Rectal, resp. rate 20, last menstrual period 07/27/2012, SpO2 100.00%. Physical Exam  Constitutional: She appears well-developed and well-nourished. She appears distressed.  HENT:  Head: Normocephalic and atraumatic.  Eyes: EOM are normal. Pupils are equal, round, and reactive to light.  Neck: Normal range of motion.  Musculoskeletal:       +SLR right LE + Popliteal compression test right  No focal weakness of EHL, ADF Tenderness of LS spine with palpation     Assessment/Plan: Pt with acute exacerbation of LBP and right LE radiculopathy.  Previous MRI finding with HNP right and paracentral at L5-S1 with lateral recess stenosis.  New MRI pending. Urine culture today. ? Source of leukocytosis Appears to have withdrawal symptoms from Xanax.  Currently placed back on Xanax in house but  ordered PRN.  Pt would benefit from cessation of these meds and possibly from psych consult PER [name] will not plan on surgical intervention unless new findings on MRI done today.  Pain management and conservative treatment until her medical and withdrawal symptoms stabilized.    Sherri [name],Sherri [name] 08/28/2012, 4:21 PM   She has delayed surgery for more than 18 months.  MRI shows the disc is herniated but smaller than it has been in the past.   She took more xanax than prescribed, states she ran out and then started having shaking spells. She has jerking spells that are a learned pattern and with the benzodiazepam withdrawal symptoms her shaking seems worse.  You may discharge her and I will see her in the office for followup . No surgical time for her case until late next week. Discharge when you feel she is stable.      Beeper [phone]

## 2012-08-29 ENCOUNTER — Inpatient Hospital Stay (HOSPITAL_COMMUNITY)

## 2012-08-29 ENCOUNTER — Encounter (HOSPITAL_COMMUNITY): Payer: Self-pay | Admitting: Internal Medicine

## 2012-08-29 DIAGNOSIS — D72829 Elevated white blood cell count, unspecified: Secondary | ICD-10-CM

## 2012-08-29 DIAGNOSIS — F1320 Sedative, hypnotic or anxiolytic dependence, uncomplicated: Secondary | ICD-10-CM

## 2012-08-29 DIAGNOSIS — N390 Urinary tract infection, site not specified: Secondary | ICD-10-CM

## 2012-08-29 DIAGNOSIS — E870 Hyperosmolality and hypernatremia: Secondary | ICD-10-CM

## 2012-08-29 DIAGNOSIS — F410 Panic disorder [episodic paroxysmal anxiety] without agoraphobia: Secondary | ICD-10-CM

## 2012-08-29 DIAGNOSIS — J189 Pneumonia, unspecified organism: Secondary | ICD-10-CM

## 2012-08-29 DIAGNOSIS — F0630 Mood disorder due to known physiological condition, unspecified: Secondary | ICD-10-CM

## 2012-08-29 DIAGNOSIS — E872 Acidosis: Secondary | ICD-10-CM

## 2012-08-29 HISTORY — DX: Pneumonia, unspecified organism: J18.9

## 2012-08-29 LAB — BASIC METABOLIC PANEL
BUN: 10 mg/dL (ref 6–23)
CO2: 19 mEq/L (ref 19–32)
Calcium: 9.4 mg/dL (ref 8.4–10.5)
Creatinine, Ser: 1.03 mg/dL (ref 0.50–1.10)
GFR calc Af Amer: 74 mL/min — ABNORMAL LOW (ref >90)
Sodium: 142 mEq/L (ref 135–145)

## 2012-08-29 LAB — MAGNESIUM: Magnesium: 2 mg/dL (ref 1.5–2.5)

## 2012-08-29 LAB — CBC
MCH: 27.8 pg (ref 26.0–34.0)
MCHC: 32.9 g/dL (ref 30.0–36.0)
MCV: 84.5 fL (ref 78.0–100.0)
Platelets: 205 10*3/uL (ref 150–400)
RDW: 13.2 % (ref 11.5–15.5)

## 2012-08-29 LAB — RETICULOCYTES
Retic Count, Absolute: 58.2 10*3/uL (ref 19.0–186.0)
Retic Ct Pct: 1.5 % (ref 0.4–3.1)

## 2012-08-29 LAB — FERRITIN: Ferritin: 223 ng/mL (ref 10–291)

## 2012-08-29 LAB — IRON AND TIBC
Iron: 84 ug/dL (ref 42–135)
Saturation Ratios: 33 % (ref 20–55)
UIBC: 174 ug/dL (ref 125–400)

## 2012-08-29 LAB — FOLATE: Folate: 15.2 ng/mL

## 2012-08-29 LAB — URINE CULTURE: Colony Count: NO GROWTH

## 2012-08-29 LAB — CK: Total CK: 17892 U/L — ABNORMAL HIGH (ref 7–177)

## 2012-08-29 MED ORDER — DEXTROSE-NACL 5-0.45 % IV SOLN
INTRAVENOUS | Status: DC
  Administered 2012-08-29 – 2012-08-30 (×2): via INTRAVENOUS

## 2012-08-29 MED ORDER — DEXTROSE-NACL 2.5-0.45 % IV SOLN
INTRAVENOUS | Status: DC
  Filled 2012-08-29 (×3): qty 1000

## 2012-08-29 MED ORDER — IOHEXOL 300 MG/ML  SOLN
80.0000 mL | Freq: Once | INTRAMUSCULAR | Status: AC | PRN
  Administered 2012-08-29: 80 mL via INTRAVENOUS

## 2012-08-29 NOTE — Progress Notes (Signed)
[facility] PROGRESS NOTE  Sherri [name] JXB:[id number] DOB: May 22, 1966 DOA: 08/27/2012 PCP: Gwynneth Aliment, MD  Assessment/Plan: Active Problems:  Acute back pain  Rhabdomyolysis  ARF (acute renal failure)  Hypokalemia  Asthma  Hypernatremia  Metabolic acidosis  Leukocytosis  UTI (lower urinary tract infection)  Anemia  1. Acute back pain/Radiculopathy: Patient presents with an acute flare of her chronic low back pain, associated with paraspinal muscle spasm and bilateral LE radiculopathy. We shall admit patient, manage with analgesics, muscle relaxants and obtain lumbar MRI to define anatomy and rule out other pathology, as wcc is elevated. Clearly, she will benefit from orthopedic consultation, for possible definitive intervention, given severity of her symptoms. Started her on robaxin and flexeril. Awaiting [name] recommendations. MRI of the lumbar spine shows Chronic L4-L5 and L5-S1 disc degeneration and disc herniations affecting the right lateral recess and right neural foramen at each level and Stable mild L4-L5 spinal stenosis,.    2. Rhabdomyolysis: Patient has a moderate rhabdomyolysis, as evidenced [place] [address], and ARF. No doubt, this is due to her recurrent muscle spasms,as described above. We shall manage with iv aggressive fluids, and follow CK levels. RENAL consult called recommended bicarb drip and follow up renal parameters in the morning. Her creatinine has improved to 17,892 and her bicarb level is 19.   3. ARF (acute renal failure): Patient came in with a creatinine of 2.07, against a known baseline creatinine of 0.83 on 01/09/12, consistent with ARF. This is secondary to rhabdomyolysis, dehydration, as well as the super-added effects of ACE-i and NSAID therapy. These nephrotoxins will be avoided, and patient managed with iv fluids. Today creatinine is 1.03. Repeat lAbs in am.  4. Hypokalemia: Will replete as indicated, and follow lytes.   5. Asthma:  Stable/asymptomatic at this time. Will manage with bronchodilators.   6. Pernicious anemia: Patient has a known history of vitamin B12 deficiency, due to intrinsic factor antibody-positve pernicious anemia,and is on 34-monthly B12 injections. HB appears reasonable at this time.   7. Leukocytosis: with elevated pro calcitonin level.   Improving . probably secondary to UTI and CAP. UA is abnormal. Started her on IV levaquin. CXR is abnormal with evidence of left lower lobe air space disease, representing infiltrate, air fluid levels are present, ? Lung abscess, we will get a CT chest with contrast to further evaluate the abnormal CXR findings.  MRI of the lumbar spine does not show any osteomyelitis or epidural abscesses.  8. Hypernatremia: probably secondary to dehydration. Will stop Normal saline fluids and start her on dextrose 2.5/ 1/2 normal saline fluid infusion at 75 ml/hr.   9. Tachycardia: possibly from the back pain. Pain control for now. TELE monitor shows sinus tachycardia.   10. Anxiety and benzodiazepine abuse: psychiatry consult called for further recommendations.   Further management will depend on clinical course.    Code Status:FULL CODE Family Communication: none at bedside Disposition Plan: pending, PT/OT eval.   Brief narrative:  This is a 46 year old female, with known history of L5-S1 disk HNP 2007, chronic back pain since then, s/p left great toe bunionectomy 2004, allergic rhinitis, pernicious anemia with intrinsic factor antibodies, bronchial asthma, previous angioedema/urticaria, RLE DVT 3 years ago, s/p previous hernia repair, presenting with acute flare up of back pain, accompanied by muscle spasms. According to patient, since her disk rupture in 2007, she has had episodes of severe back spasm, which occur periodically, then subside. Surgery has been discussed with her primary orthopedic  surgeon, [name], and is yet to be scheduled. She woke up in AM of 8/130/13, ,had  an acute onset of low back pain and muscle spasms in her low back, and this has been recurrent, since, with each episode lasting 1-5 minutes, and at varying intervals, associated with pain radiating into both lower extremities, exacerbated by attempts at movement, and planter flexion of left great toe. This is has left her quite incapacitated, and she has not been able to eat or drink much. As a matter of fact, after hours on 08/23/12, she placed a call to [name], and was requested to call back on 08/27/12, to schedule surgery. Because of severe symptoms today, she called EMS, and came to the ED.   Consultants:  [name]. For orthopedics  Nephrology consult for rhab/ARF.  Procedures:  Mri of the lumbar spine   CT chest with contrast.  Antibiotics: levaquin. HPI/Subjective: Spasm have improved.   Objective: Filed Vitals:   08/28/12 2153 08/28/12 2213 08/29/12 0643 08/29/12 1011  BP:  140/75 133/88   Pulse:  119 117   Temp:  97.9 F (36.6 C) 98.5 F (36.9 C)   TempSrc:      Resp:  20 20   SpO2: 97% 100% 100% 96%    Intake/Output Summary (Last 24 hours) at 08/29/12 1039 Last data filed at 08/29/12 0900  Gross per 24 hour  Intake 913.75 ml  Output   1300 ml  Net -386.25 ml   There were no vitals filed for this visit.  Exam:   General:  Alert afebrile appears to be in mild distress from the back spasms  Cardiovascular: s1s2, tachycardic  Respiratory: CTAB  Abdomen: soft NT ND BS+  Back: paraspinal muscle spasm, lumbar tenderness.   Data Reviewed: Basic Metabolic Panel:  Lab 08/29/12 8341 08/28/12 0630 08/27/12 1726 08/27/12 1148  NA 142 151* -- 153*  K 3.6 4.0 -- 3.0*  CL 111 119* -- 114*  CO2 19 14* -- 19  GLUCOSE 133* 63* -- 63*  BUN 10 18 -- 22  CREATININE 1.03 1.32* -- 2.07*  CALCIUM 9.4 8.9 -- 9.4  MG 2.0 2.1 2.2 --  PHOS -- -- -- --   Liver Function Tests:  Lab 08/28/12 0630  AST 337*  ALT 67*  ALKPHOS 85  BILITOT 0.3  PROT 6.8  ALBUMIN 3.5    No results found for this basename: LIPASE:5,AMYLASE:5 in the last 168 hours No results found for this basename: AMMONIA:5 in the last 168 hours CBC:  Lab 08/29/12 0730 08/28/12 0630 08/27/12 1148  WBC 16.7* 24.9* 22.8*  NEUTROABS -- -- 20.8*  HGB 10.8* 10.7* 11.0*  HCT 32.8* 33.2* 33.0*  MCV 84.5 85.8 83.1  PLT 205 225 248   Cardiac Enzymes:  Lab 08/29/12 0730 08/28/12 0630 08/27/12 1342  CKTOTAL 96222* 97989* 5632*  CKMB -- -- --  CKMBINDEX -- -- --  TROPONINI -- -- --   BNP (last 3 results) No results found for this basename: PROBNP:3 in the last 8760 hours CBG: No results found for this basename: GLUCAP:5 in the last 168 hours  No results found for this or any previous visit (from the past 240 hour(s)).   Studies: No results found.  Scheduled Meds:    . docusate sodium  100 mg Oral BID  . escitalopram  20 mg Oral Daily  . Fluticasone-Salmeterol  1 puff Inhalation BID  . gabapentin  300 mg Oral TID  . levofloxacin (LEVAQUIN) IV  500  mg Intravenous Q24H  . loratadine  10 mg Oral Daily  . methocarbamol  500 mg Oral Q6H  . montelukast  10 mg Oral QHS  . DISCONTD: methocarbamol  500 mg Oral TID  . DISCONTD: methocarbamol  500 mg Oral TID  . DISCONTD: sodium chloride  3 mL Intravenous Q12H   Continuous Infusions:    . dextrose 75 mL/hr at 08/29/12 0750  .  sodium bicarbonate infusion 1000 mL 75 mL/hr at 08/28/12 1900  . DISCONTD: sodium chloride 150 mL/hr at 08/28/12 0942  . DISCONTD:  sodium bicarbonate infusion 1/4 NS 1000 mL      Active Problems:  Acute back pain  Rhabdomyolysis  ARF (acute renal failure)  Hypokalemia  Asthma  Hypernatremia  Metabolic acidosis  Leukocytosis  UTI (lower urinary tract infection)  Anemia       Sherri [name]  [facility] Pager [phone]. If 8PM-8AM, please contact night-coverage at [email or web address], password [facility] 08/29/2012, 10:39 AM  LOS: 2 days

## 2012-08-29 NOTE — Consult Note (Signed)
Patient Identification:  Sherri [name] Date of Evaluation:  08/29/2012 Reason for Consult:  Benzodiazepine dependence  Referring Provider: Dr. Blake Divine  History of Present Illness: Patient states that she came to the hospital because she has severe back pain and had begun having myoclonic jerks. Dr. Henderson Newcomer no documents rhabdomyolysis and acute renal failure related to the muscle spasms in the increase creatinine levels and reduction in GFR. She also reported that the patient had used a one-month supply of Xanax over approximately one week. When asked, patient states she had the prescription filled on July 26 and use the last pill on July 30. Patient is sitting up in a chair and states that his more comfortable after having to lay flat in bed. There are 2 back braces on the bedside table. She says a lot of things have happened all at once. She hurt her back 3 months ago when at work at the [employer]. She has been seeking Workmen's Comp. in [place] but has been postponed. 2 weeks ago her daughter went into labor and she is trying to help her in many ways. She went with her to the labor sweet and spent a lot of time walking with her. She says, as an afterthought, that's when she probably used extra Xanax because of the excessive walking. She also explains that prior to Xanax she had been using Valium.  She had taken Valium 40 mg daily for muscle spasms following foot surgery for bunionectomy. She is very vague about the length of time she used Valium and Xanax.  Past Psychiatric History she sees a psychiatrist for her prescriptions and has been going to a therapist for some time [facility].  Past Medical History:     Past Medical History  Diagnosis Date  . Pernicious anemia 10/30/2011  . Lumbar herniated disc   . Environmental allergies   . Angioedema   . DVT (deep venous thrombosis)   . Asthma   . Seasonal allergies   . Urticaria   . Chronic back pain   . Anxiety   .  Depression   . GERD (gastroesophageal reflux disease)   . Arthritis   . Neuromuscular disorder   . Chronic kidney disease   . Peripheral vascular disease   . CAP (community acquired pneumonia) 08/29/2012       Past Surgical History  Procedure Date  . Hernia repair   . Inguinal hernia 1983 1983    Allergies:  Allergies  Allergen Reactions  . Tylenol (Acetaminophen) Hives  . Amoxicillin Diarrhea  . Amoxicillin Diarrhea    Current Medications:  Prior to Admission medications   Medication Sig Start Date End Date Taking? Authorizing Provider  Albuterol Sulfate (PROAIR HFA IN) Inhale 2 puffs into the lungs every 4 (four) hours as needed. Shortness of breath or wheezing   Yes Historical Provider, MD  ALPRAZolam (XANAX) 0.5 MG tablet Take 0.5 mg by mouth 3 (three) times daily as needed. anxiety   Yes Historical Provider, MD  cyanocobalamin (,VITAMIN B-12,) 1000 MCG/ML injection Inject 1,000 mcg into the muscle every 3 (three) months.    Yes Historical Provider, MD  desloratadine (CLARINEX) 5 MG tablet Take 1 tablet (5 mg total) by mouth daily. 11/06/11  Yes Waymon Budge, MD  EPINEPHrine (EPI-PEN) 0.3 mg/0.3 mL DEVI Inject 0.3 mg into the muscle as needed. For life threatening allergic reaction   Yes Historical Provider, MD  escitalopram (LEXAPRO) 20 MG tablet Take 20 mg by mouth daily.  Yes Historical Provider, MD  fluticasone (FLONASE) 50 MCG/ACT nasal spray Place 2 sprays into the nose daily as needed. For allergy 11/24/11 11/23/12 Yes Luiz Blare, MD  Fluticasone-Salmeterol (ADVAIR) 250-50 MCG/DOSE AEPB Inhale 2 puffs into the lungs daily. 1 puff, twice daily- rinse mouth well after use 03/14/12  Yes Clinton D Young, MD  gabapentin (NEURONTIN) 300 MG capsule Take 300 mg by mouth 3 (three) times daily.    Yes Historical Provider, MD  HYDROcodone-acetaminophen (NORCO) 5-325 MG per tablet Take 1-2 tablets by mouth every 6 (six) hours as needed. For pain 04/19/12  Yes Historical  Provider, MD  ibuprofen (ADVIL,MOTRIN) 600 MG tablet Take 600 mg by mouth every 6 (six) hours as needed. For pain   Yes Historical Provider, MD  levocetirizine (XYZAL) 5 MG tablet Take 1 tablet (5 mg total) by mouth every evening. 11/06/11  Yes Waymon Budge, MD  montelukast (SINGULAIR) 10 MG tablet Take 10 mg by mouth at bedtime.     Yes Historical Provider, MD  naproxen sodium (ANAPROX) 220 MG tablet Take 440 mg by mouth every 12 (twelve) hours.   Yes Historical Provider, MD    Social History:    reports that she has never smoked. She does not have any smokeless tobacco history on file. She reports that she does not drink alcohol or use illicit drugs.   Family History:    Family History  Problem Relation Age of Onset  . Cancer Mother   . Other Mother   . COPD Father   . Asthma Brother   . Cancer Brother     Mental Status Examination/Evaluation: Objective:  Appearance: Casual  Psychomotor Activity:  Normal and Tremor  No tremor; just occasional foot jerks  Eye Contact::  Good  Speech:  Clear and Coherent and Slow  Volume:  Normal  Mood:  Dysphoric and Hopeless  Affect:  Blunt, Congruent and Depressed  Thought Process:  Coherent, Relevant, Circumstantial, Intact and Guarded  Orientation:  Full  Thought Content:  Very guarded about her subjective thoughts and living situation; concerned about subsistence, financial issues and delayed Workmen's Comp.   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  AXIS IV economic problems, occupational problems, other psychosocial or environmental problems, problems related to social environment and problems with primary support group  AXIS V 51-60 moderate symptoms   Assessment/Plan:  Discussed with Dr. Blake Divine, Psych CSW This patient is  calm with notable myoclonic jerks. She states that she is in moderate pain and does not really elaborate or dwell on painfulness. She has a very circumstantial style of explaining situations that are not relevant to this evaluation. Twins specifics are requested for information and and assessment, she is vague and guarded in her responses. She states that she left her husband in very early when her daughter was young and lived with her mother. She now has a daughter who is has given birth without involvement of the father. She is trying to take care of herself and states that she is trying to provide prenatal care, scheduling appointments, providing transportation, providing moral support during labor and birth her grandchild and providing a home for her daughter and the baby. She is unable to work with his severe back  problems. She is waiting for evaluation by the orthopedic doctors to determine what the next step in treatment will be. Originally, while talking with Dr. it was thought that the jerks may be due to the overt dose of benzodiazepines and resulting withdrawal effects. After considering the consequences of back pain, herniated disc and muscle spasms related to back condition it appears that it is less likely that he jerks are and affect of benzodiazepine withdrawal. If that is the case it is suggested that the Xanax she was taking 0.5 mg 3 times a day be reduced to 0.25 milligram 3 times a day as a taper from benzodiazepine dependence. She has been taking some form of this medication for at least 3 years. The chronic effects of benzodiazepine are described and she is encouraged to use Klonopin if any is needed.  She says her therapist has taught her slow breathing and focus. She is encouraged to practice this with imagery of her choice. This can be highly effective in controlling her panic attacks she describes.  She has known about this technique for some time and is encouraged to practice it more  often to decrease her potential abuse or dependence on Xanax. RECOMMENDATION:  1. Patient has capacity and may be discharged to home. If orthopedic evaluation refers her to further workup as an outpatient. 2.  Patient intends to make her appointment with her nurse practitioner Misty Stanley and her therapist. She agrees to call them today.  3.  Consider taper of Xanax to be 0.25 mg 3 times daily with plan to substitute slowly the Klonopin 0.5 mg twice daily for its longer half-life. 4... encourage patient to continue breathing/relaxation exercises with and without panic attack 5. No further psychiatric needs identified. M.D. Psychiatrist signs off Willem Klingensmith J. Ferol Luz, MD Psychiatrist  08/29/2012 2:12 PM

## 2012-08-30 DIAGNOSIS — F13239 Sedative, hypnotic or anxiolytic dependence with withdrawal, unspecified: Secondary | ICD-10-CM

## 2012-08-30 DIAGNOSIS — E876 Hypokalemia: Secondary | ICD-10-CM

## 2012-08-30 DIAGNOSIS — F13939 Sedative, hypnotic or anxiolytic use, unspecified with withdrawal, unspecified: Secondary | ICD-10-CM

## 2012-08-30 HISTORY — DX: Sedative, hypnotic or anxiolytic dependence with withdrawal, unspecified: F13.239

## 2012-08-30 HISTORY — DX: Sedative, hypnotic or anxiolytic use, unspecified with withdrawal, unspecified: F13.939

## 2012-08-30 LAB — BASIC METABOLIC PANEL
BUN: 4 mg/dL — ABNORMAL LOW (ref 6–23)
Calcium: 8.9 mg/dL (ref 8.4–10.5)
Chloride: 108 mEq/L (ref 96–112)
Creatinine, Ser: 0.86 mg/dL (ref 0.50–1.10)
GFR calc Af Amer: >90 mL/min (ref >90)
GFR calc non Af Amer: 79 mL/min — ABNORMAL LOW (ref >90)
Potassium: 2.8 mEq/L — ABNORMAL LOW (ref 3.5–5.1)

## 2012-08-30 LAB — CBC
HCT: 29.2 % — ABNORMAL LOW (ref 36.0–46.0)
Hemoglobin: 10 g/dL — ABNORMAL LOW (ref 12.0–15.0)
MCH: 28.3 pg (ref 26.0–34.0)
MCHC: 34.2 g/dL (ref 30.0–36.0)
RDW: 13 % (ref 11.5–15.5)

## 2012-08-30 LAB — MRSA PCR SCREENING: MRSA by PCR: NEGATIVE

## 2012-08-30 LAB — CK: Total CK: 12551 U/L — ABNORMAL HIGH (ref 7–177)

## 2012-08-30 MED ORDER — SODIUM CHLORIDE 0.9 % IV SOLN
INTRAVENOUS | Status: DC
  Administered 2012-08-30 – 2012-09-05 (×8): via INTRAVENOUS

## 2012-08-30 MED ORDER — LORAZEPAM 2 MG/ML IJ SOLN
INTRAMUSCULAR | Status: AC
  Filled 2012-08-30: qty 1

## 2012-08-30 MED ORDER — LORAZEPAM 2 MG/ML IJ SOLN
2.0000 mg | INTRAMUSCULAR | Status: DC | PRN
  Administered 2012-08-30: 2 mg via INTRAVENOUS

## 2012-08-30 MED ORDER — METHOCARBAMOL 100 MG/ML IJ SOLN
500.0000 mg | Freq: Once | INTRAVENOUS | Status: AC
  Administered 2012-08-30: 500 mg via INTRAVENOUS
  Filled 2012-08-30: qty 5

## 2012-08-30 MED ORDER — ALPRAZOLAM 0.25 MG PO TABS
0.2500 mg | ORAL_TABLET | Freq: Three times a day (TID) | ORAL | Status: DC | PRN
  Administered 2012-08-30 – 2012-09-01 (×4): 0.25 mg via ORAL
  Filled 2012-08-30 (×4): qty 1

## 2012-08-30 MED ORDER — LORAZEPAM 2 MG/ML IJ SOLN: 2.0000 mg | Freq: Once | INTRAMUSCULAR | Status: DC

## 2012-08-30 MED ORDER — LABETALOL HCL 5 MG/ML IV SOLN
5.0000 mg | Freq: Four times a day (QID) | INTRAVENOUS | Status: DC | PRN
  Filled 2012-08-30: qty 4

## 2012-08-30 MED ORDER — POTASSIUM CHLORIDE CRYS ER 20 MEQ PO TBCR
40.0000 meq | EXTENDED_RELEASE_TABLET | Freq: Two times a day (BID) | ORAL | Status: AC
  Administered 2012-08-30 (×2): 40 meq via ORAL
  Filled 2012-08-30 (×2): qty 2

## 2012-08-30 MED ORDER — POTASSIUM CHLORIDE 10 MEQ/100ML IV SOLN
10.0000 meq | INTRAVENOUS | Status: AC
  Administered 2012-08-30 (×2): 10 meq via INTRAVENOUS
  Filled 2012-08-30 (×2): qty 100

## 2012-08-30 NOTE — Progress Notes (Signed)
Pt's HR in 120s, BP 127/82.  Pt resting comfortably after receiving IV dilaudid.  Dr. Blake Divine notified of continued ST.  12 lead EKG to be performed and Dr. Blake Divine to come to bedside later to reassess.  Will continue to monitor.  Roselie Awkward, RN

## 2012-08-30 NOTE — Progress Notes (Signed)
Pt admitted to 3305 from [facility] transported by [facility].  BP 133/102, HR 131.  Pt having spasms occasionally in bilateral arms and legs.  Pt repositioned in bed, has just received IV ativan and robaxin by 5N RN.  Call bell at pt's side.    Roselie Awkward, RN

## 2012-08-30 NOTE — Progress Notes (Signed)
Clinical Social Work Progress Note PSYCHIATRY SERVICE LINE 08/30/2012  Patient:  GRACEMARIE SKEET  Account:  [id number]  Admit Date:  08/27/2012  Clinical Social Worker:  Ashley Jacobs, LCSW  Date/Time:  08/30/2012 08:55 AM  Review of Patient  Overall Medical Condition:   Patient sitting up in chair with breakfast in front of her. She is pleasant upon approach, but once introduced as follow up for psych, patient became very guarded and shut down.  She has limited eye contact and does not look at Clinical research associate and at one point stopped talking all together and began doing deep breathing techniques.    Discussed and educated with patient this is only a follow up to identify any other resources or interventions.  Patient declined politely and reports she has an appointment scheduled and does not need anything Psych can offer.    Patient mood and affect are congruent in which she is blunt and guarded. She is resistant to resources and other needs.  She reports she hopes to go home today.    No other needs identified. Patient does not appear to be tearful, SI, HI, or psychotic. She is well groomed sitting in chair.   Participation Level:  Minimal  Participation Quality  Guarded  Resistant   Other Participation Quality:   Very limited, patient very defensive during assessment and resistant.   Affect  Blunt  Angry   Cognitive  Appropriate   Reaction to Medications/Concerns:   none disclosed   Modes of Intervention  Exploration  Support  Behaviors/Psychosis   Summary of Progress/Plan at Discharge   1. DC home when medically stable.  2. Declines any resources or assistance.  3. Does not appear or report in danger to self or others.  4. Reports she will follow up in the outpatient on her own, as she has her own counselor in the community.    Signing off. If needs arise, please re-consult, [phone]     Ashley Jacobs, MSW LCSW [phone]

## 2012-08-30 NOTE — Progress Notes (Signed)
[facility] PROGRESS NOTE  Sherri [name] ZOX:[id number] DOB: 1966/12/06 DOA: 08/27/2012 PCP: Gwynneth Aliment, MD  Assessment/Plan: Active Problems:  Acute back pain  Rhabdomyolysis  ARF (acute renal failure)  Hypokalemia  Asthma  Hypernatremia  Metabolic acidosis  Leukocytosis  UTI (lower urinary tract infection)  Anemia  CAP (community acquired pneumonia)  1. Acute back pain/Radiculopathy: Patient presents with an acute flare of her chronic low back pain, associated with paraspinal muscle spasm and bilateral LE radiculopathy. We shall admit patient, manage with analgesics, muscle relaxants and obtain lumbar MRI to define anatomy and rule out other pathology, as wcc is elevated. Clearly, she will benefit from orthopedic consultation, for possible definitive intervention, given severity of her symptoms. Started her on robaxin and flexeril. Awaiting [name] recommendations. MRI of the lumbar spine shows Chronic L4-L5 and L5-S1 disc degeneration and disc herniations affecting the right lateral recess and right neural foramen at each level and Stable mild L4-L5 spinal stenosis,. Spoke to [name], recommended no surgical intervention when she is in benzo withdrawal. Recommended post poning the surgery to a later time in the future.    2. Rhabdomyolysis: Patient has a moderate rhabdomyolysis, as evidenced [place] [address], and ARF. No doubt, this is due to her recurrent muscle spasms,as described above. We shall manage with iv aggressive fluids, and follow CK levels. RENAL consult called recommended bicarb drip and follow up renal parameters in the morning. Her creatinine has improved to 17,892 and her bicarb level is 19.   3. ARF (acute renal failure): Patient came in with a creatinine of 2.07, against a known baseline creatinine of 0.83 on 01/09/12, consistent with ARF. This is secondary to rhabdomyolysis, dehydration, as well as the super-added effects of ACE-i and NSAID therapy. These  nephrotoxins will be avoided, and patient managed with iv fluids. Today creatinine is 1.03. Repeat lAbs in am.  4. Hypokalemia: Will replete as indicated, and follow lytes.   5. Asthma: Stable/asymptomatic at this time. Will manage with bronchodilators.   6. Pernicious anemia: Patient has a known history of vitamin B12 deficiency, due to intrinsic factor antibody-positve pernicious anemia,and is on 32-monthly B12 injections. HB appears reasonable at this time.   7. Leukocytosis: with elevated pro calcitonin level.   Improving . probably secondary to UTI and CAP. UA is abnormal. Started her on IV levaquin. CXR is abnormal with evidence of left lower lobe air space disease, representing infiltrate, air fluid levels are present, ? Lung abscess, we will get a CT chest with contrast to further evaluate the abnormal CXR findings.  MRI of the lumbar spine does not show any osteomyelitis or epidural abscesses.  8. Hypernatremia: probably secondary to dehydration. Will stop Normal saline fluids and start her on dextrose 2.5/ 1/2 normal saline fluid infusion at 75 ml/hr.   9. Tachycardia: possibly from the back pain. Pain control for now. TELE monitor shows sinus tachycardia.   10. Anxiety and benzodiazepine abuse: Rapid response called today for tachycardia, spasms, sweating, hypertension, possibly from  benzodiazepine withdrawal. Her symptoms improved after a dose of IV ativan of 2 mg.  She is calmer now. We are getting a 12 lead EKG, and transferring the patient to step down for closer monitoring. She might further need valium if she doesn't improve on ativan.     Further management will depend on clinical course.    Code Status:FULL CODE Family Communication: none at bedside Disposition Plan: pending, PT/OT eval.   Brief narrative:  This is a  46 year old female, with known history of L5-S1 disk HNP 2007, chronic back pain since then, s/p left great toe bunionectomy 2004, allergic rhinitis,  pernicious anemia with intrinsic factor antibodies, bronchial asthma, previous angioedema/urticaria, RLE DVT 3 years ago, s/p previous hernia repair, presenting with acute flare up of back pain, accompanied by muscle spasms. According to patient, since her disk rupture in 2007, she has had episodes of severe back spasm, which occur periodically, then subside. Surgery has been discussed with her primary orthopedic surgeon, [name], and is yet to be scheduled. She woke up in AM of 8/130/13, ,had an acute onset of low back pain and muscle spasms in her low back, and this has been recurrent, since, with each episode lasting 1-5 minutes, and at varying intervals, associated with pain radiating into both lower extremities, exacerbated by attempts at movement, and planter flexion of left great toe. This is has left her quite incapacitated, and she has not been able to eat or drink much. As a matter of fact, after hours on 08/23/12, she placed a call to [name], and was requested to call back on 08/27/12, to schedule surgery. Because of severe symptoms today, she called EMS, and came to the ED.   Consultants:  [name]. For orthopedics  Nephrology consult for rhab/ARF.  Procedures:  Mri of the lumbar spine   CT chest with contrast.  Antibiotics: levaquin. HPI/Subjective: Spasm have improved.   Objective: Filed Vitals:   08/30/12 0504 08/30/12 0808 08/30/12 1108 08/30/12 1300  BP: 130/80 131/110  152/109  Pulse: 115 113  112  Temp: 98.4 F (36.9 C) 98.2 F (36.8 C)  98.5 F (36.9 C)  TempSrc:  Oral    Resp: 18 18  20   SpO2: 99%  99% 97%    Intake/Output Summary (Last 24 hours) at 08/30/12 1636 Last data filed at 08/30/12 1300  Gross per 24 hour  Intake   1345 ml  Output   2376 ml  Net  -1031 ml   There were no vitals filed for this visit.  Exam:   General:  Alert afebrile appears to be in mild distress from the back spasms  Cardiovascular: s1s2, tachycardic  Respiratory:  CTAB  Abdomen: soft NT ND BS+  Back: paraspinal muscle spasm, lumbar tenderness.   Data Reviewed: Basic Metabolic Panel:  Lab 08/30/12 4098 08/29/12 0730 08/28/12 0630 08/27/12 1726 08/27/12 1148  NA 141 142 151* -- 153*  K 2.8* 3.6 4.0 -- 3.0*  CL 108 111 119* -- 114*  CO2 26 19 14* -- 19  GLUCOSE 149* 133* 63* -- 63*  BUN 4* 10 18 -- 22  CREATININE 0.86 1.03 1.32* -- 2.07*  CALCIUM 8.9 9.4 8.9 -- 9.4  MG -- 2.0 2.1 2.2 --  PHOS -- -- -- -- --   Liver Function Tests:  Lab 08/28/12 0630  AST 337*  ALT 67*  ALKPHOS 85  BILITOT 0.3  PROT 6.8  ALBUMIN 3.5   No results found for this basename: LIPASE:5,AMYLASE:5 in the last 168 hours No results found for this basename: AMMONIA:5 in the last 168 hours CBC:  Lab 08/30/12 0620 08/29/12 0730 08/28/12 0630 08/27/12 1148  WBC 8.0 16.7* 24.9* 22.8*  NEUTROABS -- -- -- 20.8*  HGB 10.0* 10.8* 10.7* 11.0*  HCT 29.2* 32.8* 33.2* 33.0*  MCV 82.7 84.5 85.8 83.1  PLT 179 205 225 248   Cardiac Enzymes:  Lab 08/29/12 0730 08/28/12 0630 08/27/12 1342  CKTOTAL 11914* 78295* 6213*  CKMB -- -- --  CKMBINDEX -- -- --  TROPONINI -- -- --   BNP (last 3 results) No results found for this basename: PROBNP:3 in the last 8760 hours CBG: No results found for this basename: GLUCAP:5 in the last 168 hours  Recent Results (from the past 240 hour(s))  URINE CULTURE     Status: Normal   Collection Time   08/27/12  2:33 PM      Component Value Range Status Comment   Specimen Description URINE, CATHETERIZED   Final    Special Requests ADDED 08/28/12 2013   Final    Culture  Setup Time 08/28/2012 21:20   Final    Colony Count NO GROWTH   Final    Culture NO GROWTH   Final    Report Status 08/29/2012 FINAL   Final      Studies: No results found.  Scheduled Meds:    . docusate sodium  100 mg Oral BID  . escitalopram  20 mg Oral Daily  . Fluticasone-Salmeterol  1 puff Inhalation BID  . gabapentin  300 mg Oral TID  . levofloxacin  (LEVAQUIN) IV  500 mg Intravenous Q24H  . loratadine  10 mg Oral Daily  . LORazepam      . LORazepam  2 mg Intravenous Once  . methocarbamol (ROBAXIN) IV  500 mg Intravenous Once  . methocarbamol  500 mg Oral Q6H  . montelukast  10 mg Oral QHS  . potassium chloride  10 mEq Intravenous Q1 Hr x 2  . potassium chloride  40 mEq Oral BID   Continuous Infusions:    . sodium chloride 75 mL/hr at 08/30/12 1324  . DISCONTD: dextrose 5 % and 0.45% NaCl 75 mL/hr at 08/30/12 0826  . DISCONTD:  sodium bicarbonate infusion 1000 mL 75 mL/hr at 08/28/12 1900    Active Problems:  Acute back pain  Rhabdomyolysis  ARF (acute renal failure)  Hypokalemia  Asthma  Hypernatremia  Metabolic acidosis  Leukocytosis  UTI (lower urinary tract infection)  Anemia  CAP (community acquired pneumonia)       [facility]  [facility] Pager [phone]. If 8PM-8AM, please contact night-coverage at [email or web address], password [facility] 08/30/2012, 4:36 PM  LOS: 3 days

## 2012-08-30 NOTE — Progress Notes (Signed)
Patient had BP of 152/109 but was asymptomatic. MD is aware. RN will continue to monitor.

## 2012-08-30 NOTE — Progress Notes (Signed)
Called by RN for SVT.  Upon arrival to bedside RN and MD at bedside.  VSS Assisted with transport to 3305 via bed and monitor on.  RN to call if assistance needed

## 2012-08-30 NOTE — Progress Notes (Signed)
Pt. Emergency contact was notified of situation and new room location.

## 2012-08-30 NOTE — Progress Notes (Signed)
Patient went into SVT with severe muscle spasms. Pt. Was SOB and sweating profusely. MD notified as well as Rapid Response RN. Pt. Transferred to 3305. Upon transfer muscle spasms were decreased. Pt. VS were 135/82, HR 133, 02 96%, Temp 98.3. 2mg  Ativan IV x1 and 500 mg Robaxin x1 were given before transfer.

## 2012-08-30 NOTE — Evaluation (Signed)
Physical Therapy Evaluation Patient Details Name: Sherri [name] MRN: [id number] DOB: 1966-07-01 Today's Date: 08/30/2012 Time: 7829-5621 PT Time Calculation (min): 45 min  PT Assessment / Plan / Recommendation Clinical Impression  Pt admitted for unknown muscle spasms and benzo withdrawl. Upon initial movement of LEs in recliner, pt went into extreme vtach and RN(x3) present for rest of session. Pt with continuous muscle spasms lasting 1-5 minutes requiring multiple turns and movements in recliner. Second PT came to assist first PT with a lift of the patient from the recliner to the chair as requested by RN for stat EKG. Pt given ativan and brought to a step down unit. Will continue further assessment in future sessions pending medical stability.    PT Assessment  Patient needs continued PT services    Follow Up Recommendations  Other (comment) (TBD)    Barriers to Discharge        Equipment Recommendations  Other (comment) (TBD)    Recommendations for Other Services     Frequency Min 3X/week    Precautions / Restrictions Precautions Precautions: Fall Restrictions Weight Bearing Restrictions: No   Pertinent Vitals/Pain See MD note for vitals      Mobility  Bed Mobility Bed Mobility: Not assessed Transfers Details for Transfer Assistance: Transfer completed from chair to bed. Total assist x 2 with complete lift. Ambulation/Gait Ambulation/Gait Assistance: Not tested (comment)    Exercises     PT Diagnosis: Difficulty walking  PT Problem List: Decreased strength;Decreased range of motion;Decreased activity tolerance;Decreased mobility;Decreased knowledge of use of DME;Decreased safety awareness;Decreased knowledge of precautions;Impaired tone PT Treatment Interventions: DME instruction;Gait training;Stair training;Functional mobility training;Therapeutic activities;Therapeutic exercise;Neuromuscular re-education;Patient/family education   PT Goals Acute Rehab PT  Goals PT Goal Formulation: With patient Time For Goal Achievement: 09/13/12 Potential to Achieve Goals: Fair Pt will go Sit to Stand: with supervision PT Goal: Sit to Stand - Progress: Goal set today Pt will go Stand to Sit: with supervision PT Goal: Stand to Sit - Progress: Goal set today Pt will Transfer Bed to Chair/Chair to Bed: with supervision PT Transfer Goal: Bed to Chair/Chair to Bed - Progress: Goal set today Pt will Ambulate: 51 - 150 feet;with supervision;with least restrictive assistive device PT Goal: Ambulate - Progress: Goal set today Pt will Go Up / Down Stairs: Flight;with supervision;with rail(s) PT Goal: Up/Down Stairs - Progress: Goal set today  Visit Information  Last PT Received On: 08/30/12 Assistance Needed: +2    Subjective Data      Prior Functioning  Home Living Lives With: Daughter Available Help at Discharge: Family;Other (Comment) (daughter just had baby) Type of Home: House Home Access: Level entry Home Layout: Two level Alternate Level Stairs-Number of Steps: 15 Alternate Level Stairs-Rails: Left Bathroom Shower/Tub: Walk-in shower;Door [employer] Toilet: Standard Bathroom Accessibility: Yes How Accessible: Accessible via walker Home Adaptive Equipment: Straight cane Prior Function Level of Independence: Independent Able to Take Stairs?: Yes Driving: Yes Vocation: Full time employment Comments: Visual merchandiser Communication: No difficulties Dominant Hand: Right    Cognition  Overall Cognitive Status: Appears within functional limits for tasks assessed/performed Arousal/Alertness: Awake/alert Orientation Level: Appears intact for tasks assessed Behavior During Session: [facility] for tasks performed    Extremity/Trunk Assessment Right Lower Extremity Assessment RLE ROM/Strength/Tone: Unable to fully assess (due to spasms) RLE Sensation: WFL - Light Touch Left Lower Extremity Assessment LLE ROM/Strength/Tone: Unable to fully assess  (due to spasms) LLE Sensation: WFL - Light Touch   Balance    End of Session PT -  End of Session Activity Tolerance: Treatment limited secondary to medical complications (Comment) (see comments and MD note) Patient left: in bed;with nursing in room Nurse Communication: Other (comment) (RN present for session)  GP     Milana Kidney 08/30/2012, 5:40 PM  08/30/2012 Milana Kidney DPT PAGER: [phone] OFFICE: [phone]

## 2012-08-30 NOTE — Progress Notes (Signed)
Patient had a 2 min 30 sec run of SVT. VS were BP 113/110 RR 20 Temp 98.3. Pt was sweating. Potassium was 2.8 MD was notified, advised that she would place order for potassium runs and PO potassium.

## 2012-08-31 DIAGNOSIS — D649 Anemia, unspecified: Secondary | ICD-10-CM

## 2012-08-31 MED ORDER — ALUM & MAG HYDROXIDE-SIMETH 200-200-20 MG/5ML PO SUSP
30.0000 mL | Freq: Four times a day (QID) | ORAL | Status: DC | PRN
  Administered 2012-08-31 – 2012-09-03 (×2): 30 mL via ORAL
  Filled 2012-08-31 (×2): qty 30

## 2012-08-31 MED ORDER — LEVOFLOXACIN 500 MG PO TABS
500.0000 mg | ORAL_TABLET | Freq: Every day | ORAL | Status: DC
  Administered 2012-09-01 – 2012-09-02 (×2): 500 mg via ORAL
  Filled 2012-08-31 (×4): qty 1

## 2012-08-31 NOTE — Progress Notes (Signed)
[facility] PROGRESS NOTE  Sherri [name] QMV:[id number] DOB: [birth year] DOA: 08/27/2012 PCP: Gwynneth Aliment, MD  Assessment/Plan: Active Problems:  Acute back pain  Rhabdomyolysis  ARF (acute renal failure)  Hypokalemia  Asthma  Hypernatremia  Metabolic acidosis  Leukocytosis  UTI (lower urinary tract infection)  Anemia  CAP (community acquired pneumonia)  Benzodiazepine withdrawal  1. Acute back pain/Radiculopathy: Patient presents with an acute flare of her chronic low back pain, associated with paraspinal muscle spasm and bilateral LE radiculopathy. We shall admit patient, manage with analgesics, muscle relaxants and obtain lumbar MRI to define anatomy and rule out other pathology, as wcc is elevated. Clearly, she will benefit from orthopedic consultation, for possible definitive intervention, given severity of her symptoms. Started her on robaxin and flexeril. Awaiting [name] recommendations. MRI of the lumbar spine shows Chronic L4-L5 and L5-S1 disc degeneration and disc herniations affecting the right lateral recess and right neural foramen at each level and Stable mild L4-L5 spinal stenosis,. Spoke to [name], recommended no surgical intervention when she is in benzo withdrawal. Recommended post poning the surgery to a later time in the future.    2. Rhabdomyolysis: Patient has a moderate rhabdomyolysis, as evidenced by a [place] of 28413, and ARF. No doubt, this is due to her recurrent muscle spasms,as described above. We shall manage with iv aggressive fluids, and follow CK levels. RENAL consult called recommended bicarb drip and follow up renal parameters in the morning. Her creatinine has improved to 12,000's and her bicarb level is better.  3. ARF (acute renal failure): Patient came in with a creatinine of 2.07, against a known baseline creatinine of 0.83 on 01/09/12, consistent with ARF. This is secondary to rhabdomyolysis, dehydration, as well as the super-added effects of  ACE-i and NSAID therapy. These nephrotoxins will be avoided, and patient managed with iv fluids. Today creatinine is 1.03. Repeat lAbs in am.  4. Hypokalemia: Will replete as indicated, and follow lytes.   5. Asthma: Stable/asymptomatic at this time. Will manage with bronchodilators.   6. Pernicious anemia: Patient has a known history of vitamin B12 deficiency, due to intrinsic factor antibody-positve pernicious anemia,and is on 71-monthly B12 injections. HB appears reasonable at this time.   7. Leukocytosis: with elevated pro calcitonin level.   Improving . probably secondary to UTI and CAP. UA is abnormal. Started her on IV levaquin. CXR is abnormal with evidence of left lower lobe air space disease, representing infiltrate, air fluid levels are present, ? Lung abscess, we will get a CT chest with contrast to further evaluate the abnormal CXR findings.  MRI of the lumbar spine does not show any osteomyelitis or epidural abscesses.  8. Hypernatremia: probably secondary to dehydration. Will stop Normal saline fluids and start her on dextrose 2.5/ 1/2 normal saline fluid infusion at 75 ml/hr.   9. Tachycardia: possibly from the back pain. Pain control for now. TELE monitor shows sinus tachycardia.   10. Anxiety and benzodiazepine abuse: Rapid response called today for tachycardia, spasms, sweating, hypertension, possibly from  benzodiazepine withdrawal. Her symptoms improved after a dose of IV ativan of 2 mg. 12 lead EKG shows sinus tachycardia  We have transferred the patient to step down for closer monitoring. She might further need valium if she doesn't improve on ativan. She reports that she has anxiety and panic attack disorder.     Further management will depend on clinical course.    Code Status:FULL CODE Family Communication: none at bedside Disposition Plan: pending, PT/OT eval.  Brief narrative:  This is a 46 year old female, with known history of L5-S1 disk HNP 2007, chronic  back pain since then, s/p left great toe bunionectomy 2004, allergic rhinitis, pernicious anemia with intrinsic factor antibodies, bronchial asthma, previous angioedema/urticaria, RLE DVT 3 years ago, s/p previous hernia repair, presenting with acute flare up of back pain, accompanied by muscle spasms. According to patient, since her disk rupture in 2007, she has had episodes of severe back spasm, which occur periodically, then subside. Surgery has been discussed with her primary orthopedic surgeon, [name], and is yet to be scheduled. She woke up in AM of 8/130/13, ,had an acute onset of low back pain and muscle spasms in her low back, and this has been recurrent, since, with each episode lasting 1-5 minutes, and at varying intervals, associated with pain radiating into both lower extremities, exacerbated by attempts at movement, and planter flexion of left great toe. This is has left her quite incapacitated, and she has not been able to eat or drink much. As a matter of fact, after hours on 08/23/12, she placed a call to [name], and was requested to call back on 08/27/12, to schedule surgery. Because of severe symptoms today, she called EMS, and came to the ED.   Consultants:  [name]. For orthopedics  Nephrology consult for rhab/ARF.  Procedures:  Mri of the lumbar spine   CT chest with contrast.  Antibiotics: levaquin. HPI/Subjective: Spasm have improved.   Objective: Filed Vitals:   08/31/12 0800 08/31/12 1200 08/31/12 1600 08/31/12 1700  BP: 124/89 132/85 115/74   Pulse: 115 98    Temp: 98.5 F (36.9 C) 97.6 F (36.4 C)  97.8 F (36.6 C)  TempSrc: Oral Oral  Oral  Resp: 26 23    Height:      Weight:      SpO2: 97% 100%      Intake/Output Summary (Last 24 hours) at 08/31/12 1728 Last data filed at 08/31/12 1700  Gross per 24 hour  Intake   2050 ml  Output   1500 ml  Net    550 ml   Filed Weights   08/30/12 1700  Weight: 82 kg (180 lb 12.4 oz)     Exam:   General:  Alert afebrile appears to be in mild distress from the back spasms  Cardiovascular: s1s2, tachycardic  Respiratory: CTAB  Abdomen: soft NT ND BS+  Back: paraspinal muscle spasm, lumbar tenderness.   Data Reviewed: Basic Metabolic Panel:  Lab 08/30/12 1610 08/30/12 0620 08/29/12 0730 08/28/12 0630 08/27/12 1726 08/27/12 1148  NA -- 141 142 151* -- 153*  K 3.6 2.8* 3.6 4.0 -- 3.0*  CL -- 108 111 119* -- 114*  CO2 -- 26 19 14* -- 19  GLUCOSE -- 149* 133* 63* -- 63*  BUN -- 4* 10 18 -- 22  CREATININE -- 0.86 1.03 1.32* -- 2.07*  CALCIUM -- 8.9 9.4 8.9 -- 9.4  MG 1.7 -- 2.0 2.1 2.2 --  PHOS -- -- -- -- -- --   Liver Function Tests:  Lab 08/28/12 0630  AST 337*  ALT 67*  ALKPHOS 85  BILITOT 0.3  PROT 6.8  ALBUMIN 3.5   No results found for this basename: LIPASE:5,AMYLASE:5 in the last 168 hours No results found for this basename: AMMONIA:5 in the last 168 hours CBC:  Lab 08/30/12 0620 08/29/12 0730 08/28/12 0630 08/27/12 1148  WBC 8.0 16.7* 24.9* 22.8*  NEUTROABS -- -- -- 20.8*  HGB 10.0* 10.8* 10.7* 11.0*  HCT 29.2* 32.8* 33.2* 33.0*  MCV 82.7 84.5 85.8 83.1  PLT 179 205 225 248   Cardiac Enzymes:  Lab 08/30/12 1743 08/29/12 0730 08/28/12 0630 08/27/12 1342  CKTOTAL 16109* 60454* 09811* 5632*  CKMB -- -- -- --  CKMBINDEX -- -- -- --  TROPONINI -- -- -- --   BNP (last 3 results) No results found for this basename: PROBNP:3 in the last 8760 hours CBG: No results found for this basename: GLUCAP:5 in the last 168 hours  Recent Results (from the past 240 hour(s))  URINE CULTURE     Status: Normal   Collection Time   08/27/12  2:33 PM      Component Value Range Status Comment   Specimen Description URINE, CATHETERIZED   Final    Special Requests ADDED 08/28/12 2013   Final    Culture  Setup Time 08/28/2012 21:20   Final    Colony Count NO GROWTH   Final    Culture NO GROWTH   Final    Report Status 08/29/2012 FINAL   Final   MRSA PCR  SCREENING     Status: Normal   Collection Time   08/30/12  5:22 PM      Component Value Range Status Comment   MRSA by PCR NEGATIVE  NEGATIVE Final      Studies: No results found.  Scheduled Meds:    . docusate sodium  100 mg Oral BID  . escitalopram  20 mg Oral Daily  . Fluticasone-Salmeterol  1 puff Inhalation BID  . gabapentin  300 mg Oral TID  . levofloxacin  500 mg Oral Q1400  . loratadine  10 mg Oral Daily  . LORazepam  2 mg Intravenous Once  . methocarbamol (ROBAXIN) IV  500 mg Intravenous Once  . methocarbamol  500 mg Oral Q6H  . montelukast  10 mg Oral QHS  . potassium chloride  40 mEq Oral BID  . DISCONTD: levofloxacin (LEVAQUIN) IV  500 mg Intravenous Q24H   Continuous Infusions:    . sodium chloride 75 mL/hr at 08/31/12 1600    Active Problems:  Acute back pain  Rhabdomyolysis  ARF (acute renal failure)  Hypokalemia  Asthma  Hypernatremia  Metabolic acidosis  Leukocytosis  UTI (lower urinary tract infection)  Anemia  CAP (community acquired pneumonia)  Benzodiazepine withdrawal       Adisson Deak  [facility] Pager [phone]. If 8PM-8AM, please contact night-coverage at [email or web address], password [facility] 08/31/2012, 5:28 PM  LOS: 4 days

## 2012-08-31 NOTE — Progress Notes (Signed)
Per IV to PO protocol, Levaquin can be changed from IV to PO if patient is clinically improving, afebrile for at least 24 hours, and able to tolerate taking POs.  WBC 8 < 16.7 Pt has been afebrile through hospital stay Pt has been taking POs throughout hospital course  Tomi Bamberger, PharmD Clinical Pharmacist Pager: [phone] Pharmacy: [phone] 08/31/2012 4:30 PM

## 2012-08-31 NOTE — Progress Notes (Signed)
PT Cancellation Note  Treatment cancelled today due to medical issues with patient which prohibited therapy. Spoke with RN regarding appropriateness of treatment tomorrow. Pt is still having spasms and increased HR. RN requested to hold today. Will attempt treatment Monday pending medical stability.  08/31/2012 Milana Kidney DPT PAGER: [phone] OFFICE: [phone]    Milana Kidney 08/31/2012, 2:22 PM

## 2012-09-01 ENCOUNTER — Inpatient Hospital Stay (HOSPITAL_COMMUNITY)

## 2012-09-01 DIAGNOSIS — J189 Pneumonia, unspecified organism: Secondary | ICD-10-CM

## 2012-09-01 LAB — CBC
HCT: 31.1 % — ABNORMAL LOW (ref 36.0–46.0)
Hemoglobin: 10.3 g/dL — ABNORMAL LOW (ref 12.0–15.0)
MCH: 27.8 pg (ref 26.0–34.0)
MCHC: 33.1 g/dL (ref 30.0–36.0)
MCV: 84.1 fL (ref 78.0–100.0)
RBC: 3.7 MIL/uL — ABNORMAL LOW (ref 3.87–5.11)

## 2012-09-01 LAB — BASIC METABOLIC PANEL
BUN: 7 mg/dL (ref 6–23)
CO2: 24 mEq/L (ref 19–32)
Calcium: 9.4 mg/dL (ref 8.4–10.5)
Creatinine, Ser: 0.73 mg/dL (ref 0.50–1.10)
Glucose, Bld: 101 mg/dL — ABNORMAL HIGH (ref 70–99)
Sodium: 138 mEq/L (ref 135–145)

## 2012-09-01 MED ORDER — METOPROLOL TARTRATE 25 MG PO TABS
25.0000 mg | ORAL_TABLET | Freq: Two times a day (BID) | ORAL | Status: DC
  Administered 2012-09-01 – 2012-09-05 (×8): 25 mg via ORAL
  Filled 2012-09-01 (×9): qty 1

## 2012-09-01 MED ORDER — CLONAZEPAM 0.5 MG PO TABS
0.2500 mg | ORAL_TABLET | Freq: Two times a day (BID) | ORAL | Status: DC | PRN
  Administered 2012-09-02 – 2012-09-04 (×3): 0.25 mg via ORAL
  Filled 2012-09-01 (×3): qty 1

## 2012-09-01 MED ORDER — DIPHENHYDRAMINE HCL 25 MG PO CAPS
25.0000 mg | ORAL_CAPSULE | Freq: Three times a day (TID) | ORAL | Status: DC | PRN
  Administered 2012-09-01: 25 mg via ORAL
  Filled 2012-09-01: qty 1

## 2012-09-01 MED ORDER — LORAZEPAM 2 MG/ML IJ SOLN
0.5000 mg | INTRAMUSCULAR | Status: DC | PRN
  Administered 2012-09-03 – 2012-09-04 (×2): 0.5 mg via INTRAVENOUS
  Filled 2012-09-01 (×2): qty 1

## 2012-09-01 NOTE — Progress Notes (Signed)
Report called to [facility], receiving RN on 3W.  Pt transferred via bed with O2 to 3W05.    Roselie Awkward, RN

## 2012-09-01 NOTE — Progress Notes (Addendum)
[facility] PROGRESS NOTE  Sherri [name] RUE:[id number] DOB: May 07, 1966 DOA: 08/27/2012 PCP: Gwynneth Aliment, MD  Assessment/Plan: Active Problems:  Acute back pain  Rhabdomyolysis  ARF (acute renal failure)  Hypokalemia  Asthma  Hypernatremia  Metabolic acidosis  Leukocytosis  UTI (lower urinary tract infection)  Anemia  CAP (community acquired pneumonia)  Benzodiazepine withdrawal  1. Acute back pain/Radiculopathy: Patient presents with an acute flare of her chronic low back pain, associated with paraspinal muscle spasm and bilateral LE radiculopathy. We shall admit patient, manage with analgesics, muscle relaxants and obtain lumbar MRI to define anatomy and rule out other pathology, as wcc is elevated. Clearly, she will benefit from orthopedic consultation, for possible definitive intervention, given severity of her symptoms. Started her on robaxin and flexeril. MRI of the lumbar spine shows Chronic L4-L5 and L5-S1 disc degeneration and disc herniations affecting the right lateral recess and right neural foramen at each level and Stable mild L4-L5 spinal stenosis,. Spoke to [name], recommended no surgical intervention when she is in benzo withdrawal. Recommended post poning the surgery to a later time in the future.    2. Rhabdomyolysis: Patient has a moderate rhabdomyolysis, [place] [address], and ARF. No doubt, this is due to her recurrent muscle spasms,as described above. We shall manage with iv aggressive fluids, and follow CK levels. RENAL consult called recommended bicarb drip and follow up renal parameters in the morning. Her creatinine has improved to 12,000's and her bicarb level is better.  3. ARF (acute renal failure): Patient came in with a creatinine of 2.07, against a known baseline creatinine of 0.83 on 01/09/12, consistent with ARF. This is secondary to rhabdomyolysis, dehydration, as well as the super-added effects of ACE-i and NSAID therapy. These  nephrotoxins will be avoided, and patient managed with iv fluids. Today creatinine is 1.03. Repeat lAbs in am.  4. Hypokalemia: Will replete as indicated, and follow lytes.   5. Asthma: Stable/asymptomatic at this time. Will manage with bronchodilators.   6. Pernicious anemia: Patient has a known history of vitamin B12 deficiency, due to intrinsic factor antibody-positve pernicious anemia,and is on 29-monthly B12 injections. HB appears reasonable at this time.   7. Leukocytosis: with elevated pro calcitonin level.   Improving . probably secondary to UTI and CAP. UA is abnormal. Started her on IV levaquin. CXR is abnormal with evidence of left lower lobe air space disease, representing infiltrate, air fluid levels are present, ? Lung abscess, we will get a CT chest with contrast shows possible infectious process, treating with levaquin . Urine cultures shows no growth. .  MRI of the lumbar spine does not show any osteomyelitis or epidural abscesses.  8. Hypernatremia: probably secondary to dehydration. Will stop Normal saline fluids and start her on dextrose 2.5/ 1/2 normal saline fluid infusion at 75 ml/hr.   9. Tachycardia: possibly from the back pain. Pain control for now. TELE monitor shows sinus tachycardia.   10. Anxiety and benzodiazepine abuse: Rapid response called today for tachycardia, spasms, sweating, hypertension, possibly from  benzodiazepine withdrawal. Her symptoms improved after a dose of IV ativan of 2 mg. 12 lead EKG shows sinus tachycardia  We have transferred the patient to step down for closer monitoring. She might further need valium if she doesn't improve on ativan. She reports that she has anxiety and panic attack disorder. We have discontinued the xanax and put her on clonazepam for better control of her panic attacks and anxiety as per psychiatry recommendations.  Further management will depend on clinical course.    Code Status:FULL CODE Family Communication: none  at bedside Disposition Plan: pending, PT/OT eval.   Brief narrative:  This is a 46 year old female, with known history of L5-S1 disk HNP 2007, chronic back pain since then, s/p left great toe bunionectomy 2004, allergic rhinitis, pernicious anemia with intrinsic factor antibodies, bronchial asthma, previous angioedema/urticaria, RLE DVT 3 years ago, s/p previous hernia repair, presenting with acute flare up of back pain, accompanied by muscle spasms. According to patient, since her disk rupture in 2007, she has had episodes of severe back spasm, which occur periodically, then subside. Surgery has been discussed with her primary orthopedic surgeon, [name], and is yet to be scheduled. She woke up in AM of 8/130/13, ,had an acute onset of low back pain and muscle spasms in her low back, and this has been recurrent, since, with each episode lasting 1-5 minutes, and at varying intervals, associated with pain radiating into both lower extremities, exacerbated by attempts at movement, and planter flexion of left great toe. This is has left her quite incapacitated, and she has not been able to eat or drink much. As a matter of fact, after hours on 08/23/12, she placed a call to [name], and was requested to call back on 08/27/12, to schedule surgery. Because of severe symptoms today, she called EMS, and came to the ED.   Consultants:  [name]. For orthopedics  Nephrology consult for rhab/ARF.  Procedures:  Mri of the lumbar spine   CT chest with contrast.  Antibiotics: levaquin. HPI/Subjective: Spasm have improved.   Objective: Filed Vitals:   09/01/12 0426 09/01/12 0700 09/01/12 0810 09/01/12 1400  BP:   138/95 145/93  Pulse: 127  117 127  Temp:  98.1 F (36.7 C)  97.8 F (36.6 C)  TempSrc:  Oral  Oral  Resp: 20  22 20   Height:      Weight:      SpO2: 100%  100% 97%    Intake/Output Summary (Last 24 hours) at 09/01/12 1923 Last data filed at 09/01/12 1900  Gross per 24 hour    Intake   1605 ml  Output      0 ml  Net   1605 ml   Filed Weights   08/30/12 1700  Weight: 82 kg (180 lb 12.4 oz)    Exam:   General:  Alert afebrile appears to be in mild distress from the back spasms  Cardiovascular: s1s2, tachycardic  Respiratory: CTAB  Abdomen: soft NT ND BS+  Back: paraspinal muscle spasm, lumbar tenderness.   Data Reviewed: Basic Metabolic Panel:  Lab 09/01/12 1610 08/30/12 1743 08/30/12 0620 08/29/12 0730 08/28/12 0630 08/27/12 1726 08/27/12 1148  NA 138 -- 141 142 151* -- 153*  K 4.0 3.6 2.8* 3.6 4.0 -- --  CL 106 -- 108 111 119* -- 114*  CO2 24 -- 26 19 14* -- 19  GLUCOSE 101* -- 149* 133* 63* -- 63*  BUN 7 -- 4* 10 18 -- 22  CREATININE 0.73 -- 0.86 1.03 1.32* -- 2.07*  CALCIUM 9.4 -- 8.9 9.4 8.9 -- 9.4  MG -- 1.7 -- 2.0 2.1 2.2 --  PHOS -- -- -- -- -- -- --   Liver Function Tests:  Lab 08/28/12 0630  AST 337*  ALT 67*  ALKPHOS 85  BILITOT 0.3  PROT 6.8  ALBUMIN 3.5   No results found for this basename: LIPASE:5,AMYLASE:5 in the last 168  hours No results found for this basename: AMMONIA:5 in the last 168 hours CBC:  Lab 09/01/12 1104 08/30/12 0620 08/29/12 0730 08/28/12 0630 08/27/12 1148  WBC 10.4 8.0 16.7* 24.9* 22.8*  NEUTROABS -- -- -- -- 20.8*  HGB 10.3* 10.0* 10.8* 10.7* 11.0*  HCT 31.1* 29.2* 32.8* 33.2* 33.0*  MCV 84.1 82.7 84.5 85.8 83.1  PLT 210 179 205 225 248   Cardiac Enzymes:  Lab 08/30/12 1743 08/29/12 0730 08/28/12 0630 08/27/12 1342  CKTOTAL 04540* 98119* 14782* 5632*  CKMB -- -- -- --  CKMBINDEX -- -- -- --  TROPONINI -- -- -- --   BNP (last 3 results) No results found for this basename: PROBNP:3 in the last 8760 hours CBG: No results found for this basename: GLUCAP:5 in the last 168 hours  Recent Results (from the past 240 hour(s))  URINE CULTURE     Status: Normal   Collection Time   08/27/12  2:33 PM      Component Value Range Status Comment   Specimen Description URINE, CATHETERIZED   Final     Special Requests ADDED 08/28/12 2013   Final    Culture  Setup Time 08/28/2012 21:20   Final    Colony Count NO GROWTH   Final    Culture NO GROWTH   Final    Report Status 08/29/2012 FINAL   Final   MRSA PCR SCREENING     Status: Normal   Collection Time   08/30/12  5:22 PM      Component Value Range Status Comment   MRSA by PCR NEGATIVE  NEGATIVE Final      Studies: No results found.  Scheduled Meds:    . docusate sodium  100 mg Oral BID  . escitalopram  20 mg Oral Daily  . Fluticasone-Salmeterol  1 puff Inhalation BID  . gabapentin  300 mg Oral TID  . levofloxacin  500 mg Oral Q1400  . loratadine  10 mg Oral Daily  . LORazepam  2 mg Intravenous Once  . methocarbamol  500 mg Oral Q6H  . metoprolol tartrate  25 mg Oral BID  . montelukast  10 mg Oral QHS   Continuous Infusions:    . sodium chloride 75 mL/hr at 09/01/12 1032    Active Problems:  Acute back pain  Rhabdomyolysis  ARF (acute renal failure)  Hypokalemia  Asthma  Hypernatremia  Metabolic acidosis  Leukocytosis  UTI (lower urinary tract infection)  Anemia  CAP (community acquired pneumonia)  Benzodiazepine withdrawal       Sherri [name]  [facility] Pager [phone]. If 8PM-8AM, please contact night-coverage at [email or web address], password [facility] 09/01/2012, 7:23 PM  LOS: 5 days

## 2012-09-02 ENCOUNTER — Encounter (HOSPITAL_COMMUNITY): Payer: Self-pay | Admitting: Neurology

## 2012-09-02 DIAGNOSIS — M62838 Other muscle spasm: Secondary | ICD-10-CM

## 2012-09-02 LAB — TSH: TSH: 2.375 u[IU]/mL (ref 0.350–4.500)

## 2012-09-02 MED ORDER — BACLOFEN 5 MG HALF TABLET
5.0000 mg | ORAL_TABLET | Freq: Three times a day (TID) | ORAL | Status: DC
  Administered 2012-09-02 (×2): 5 mg via ORAL
  Filled 2012-09-02 (×5): qty 1

## 2012-09-02 MED ORDER — CYCLOBENZAPRINE HCL 5 MG PO TABS
5.0000 mg | ORAL_TABLET | Freq: Three times a day (TID) | ORAL | Status: DC
  Administered 2012-09-02 (×2): 5 mg via ORAL
  Filled 2012-09-02 (×5): qty 1

## 2012-09-02 NOTE — Progress Notes (Signed)
[facility] PROGRESS NOTE  Sherri [name] RUE:[id number] DOB: Apr 12, 1966 DOA: 08/27/2012 PCP: Gwynneth Aliment, MD  Assessment/Plan: Active Problems:  Acute back pain  Rhabdomyolysis  ARF (acute renal failure)  Hypokalemia  Asthma  Hypernatremia  Metabolic acidosis  Leukocytosis  UTI (lower urinary tract infection)  Anemia  CAP (community acquired pneumonia)  Benzodiazepine withdrawal  1. Acute back pain/Radiculopathy: Patient presents with an acute flare of her chronic low back pain, associated with paraspinal muscle spasm and bilateral LE radiculopathy. We shall admit patient, manage with analgesics, muscle relaxants and obtain lumbar MRI to define anatomy and rule out other pathology, as wcc is elevated. Clearly, she will benefit from orthopedic consultation, for possible definitive intervention, given severity of her symptoms. Started her on robaxin and flexeril. MRI of the lumbar spine shows Chronic L4-L5 and L5-S1 disc degeneration and disc herniations affecting the right lateral recess and right neural foramen at each level and Stable mild L4-L5 spinal stenosis,. Spoke to [name], recommended no surgical intervention when she is in benzo withdrawal. Recommended post poning the surgery to a later time in the future.    2. Rhabdomyolysis: Patient has a moderate rhabdomyolysis, as evidenced by [place] of 14[address], and ARF. No doubt, this is due to her recurrent muscle spasms,as described above. We shall manage with iv aggressive fluids, and follow CK levels. RENAL consult called recommended bicarb drip and follow up renal parameters in the morning. Her creatinine kinase has improved to 12,000's and her bicarb level is better.  3. ARF (acute renal failure): Patient came in with a creatinine of 2.07, against a known baseline creatinine of 0.83 on 01/09/12, consistent with ARF. This is secondary to rhabdomyolysis, dehydration, as well as the super-added effects of ACE-i and NSAID therapy.  These nephrotoxins will be avoided, and patient managed with iv fluids. Today creatinine is 1.03. Repeat lAbs in am.  4. Hypokalemia: Will replete as indicated, and follow lytes.   5. Asthma: Stable/asymptomatic at this time. Will manage with bronchodilators.   6. Pernicious anemia: Patient has a known history of vitamin B12 deficiency, due to intrinsic factor antibody-positve pernicious anemia,and is on 20-monthly B12 injections. HB appears reasonable at this time.   7. Leukocytosis: with elevated pro calcitonin level.   Improving . probably secondary to UTI and CAP. UA is abnormal. Started her on IV levaquin. CXR is abnormal with evidence of left lower lobe air space disease, representing infiltrate, air fluid levels are present, ? Lung abscess, we will get a CT chest with contrast shows possible infectious process, treating with levaquin . Urine cultures shows no growth. .  MRI of the lumbar spine does not show any osteomyelitis or epidural abscesses.  8. Hypernatremia: probably secondary to dehydration. Resolved. Will stop Normal saline fluids and start her on dextrose 2.5/ 1/2 normal saline fluid infusion at 75 ml/hr.   9. Tachycardia: possibly from the back pain. Pain control for now. TELE monitor shows sinus tachycardia. Possibly from back spasms.  10. Anxiety and benzodiazepine abuse: Rapid response called today for tachycardia, spasms, sweating, hypertension, possibly from  benzodiazepine withdrawal. Her symptoms improved after a dose of IV ativan of 2 mg. 12 lead EKG shows sinus tachycardia  We have transferred the patient to step down for closer monitoring. She might further need valium if she doesn't improve on ativan. She reports that she has anxiety and panic attack disorder. We have discontinued the xanax and put her on clonazepam for better control of her panic attacks and anxiety  as per psychiatry recommendations.   11. Back spasms: today she is actively having spasms of the back, not  improving with robaxin or flexeril. We have asked for neurology to assist [place] with further recommendations, for possible EMG.    Further management will depend on clinical course.    Code Status:FULL CODE Family Communication: none at bedside Disposition Plan: pending, PT/OT eval.   Brief narrative:  This is a 46 year old female, with known history of L5-S1 disk HNP 2007, chronic back pain since then, s/p left great toe bunionectomy 2004, allergic rhinitis, pernicious anemia with intrinsic factor antibodies, bronchial asthma, previous angioedema/urticaria, RLE DVT 3 years ago, s/p previous hernia repair, presenting with acute flare up of back pain, accompanied by muscle spasms. According to patient, since her disk rupture in 2007, she has had episodes of severe back spasm, which occur periodically, then subside. Surgery has been discussed with her primary orthopedic surgeon, [name], and is yet to be scheduled. She woke up in AM of 8/130/13, ,had an acute onset of low back pain and muscle spasms in her low back, and this has been recurrent, since, with each episode lasting 1-5 minutes, and at varying intervals, associated with pain radiating into both lower extremities, exacerbated by attempts at movement, and planter flexion of left great toe. This is has left her quite incapacitated, and she has not been able to eat or drink much. As a matter of fact, after hours on 08/23/12, she placed a call to [name], and was requested to call back on 08/27/12, to schedule surgery. Because of severe symptoms today, she called EMS, and came to the ED.   Consultants:  [name]. For orthopedics  Nephrology consult for rhab/ARF.  Procedures:  Mri of the lumbar spine   CT chest with contrast.  Antibiotics: levaquin. HPI/Subjective: STARTING HAVING SPASMS THIS MORNING.  .  Objective: Filed Vitals:   09/02/12 0500 09/02/12 0848 09/02/12 0923 09/02/12 0945  BP: 131/91   141/91  Pulse: 109   115    Temp: 97.4 F (36.3 C)     TempSrc: Oral     Resp: 20     Height:      Weight:      SpO2: 93% 94% 94%     Intake/Output Summary (Last 24 hours) at 09/02/12 1059 Last data filed at 09/02/12 0800  Gross per 24 hour  Intake    580 ml  Output      0 ml  Net    580 ml   Filed Weights   08/30/12 1700  Weight: 82 kg (180 lb 12.4 oz)    Exam:   General:  Alert afebrile appears to be in mild distress from the back spasms  Cardiovascular: s1s2, tachycardic  Respiratory: CTAB  Abdomen: soft NT ND BS+  Back: paraspinal muscle spasm, lumbar tenderness.   Data Reviewed: Basic Metabolic Panel:  Lab 09/01/12 4098 08/30/12 1743 08/30/12 0620 08/29/12 0730 08/28/12 0630 08/27/12 1726 08/27/12 1148  NA 138 -- 141 142 151* -- 153*  K 4.0 3.6 2.8* 3.6 4.0 -- --  CL 106 -- 108 111 119* -- 114*  CO2 24 -- 26 19 14* -- 19  GLUCOSE 101* -- 149* 133* 63* -- 63*  BUN 7 -- 4* 10 18 -- 22  CREATININE 0.73 -- 0.86 1.03 1.32* -- 2.07*  CALCIUM 9.4 -- 8.9 9.4 8.9 -- 9.4  MG -- 1.7 -- 2.0 2.1 2.2 --  PHOS -- -- -- -- -- -- --  Liver Function Tests:  Lab 08/28/12 0630  AST 337*  ALT 67*  ALKPHOS 85  BILITOT 0.3  PROT 6.8  ALBUMIN 3.5   No results found for this basename: LIPASE:5,AMYLASE:5 in the last 168 hours No results found for this basename: AMMONIA:5 in the last 168 hours CBC:  Lab 09/01/12 1104 08/30/12 0620 08/29/12 0730 08/28/12 0630 08/27/12 1148  WBC 10.4 8.0 16.7* 24.9* 22.8*  NEUTROABS -- -- -- -- 20.8*  HGB 10.3* 10.0* 10.8* 10.7* 11.0*  HCT 31.1* 29.2* 32.8* 33.2* 33.0*  MCV 84.1 82.7 84.5 85.8 83.1  PLT 210 179 205 225 248   Cardiac Enzymes:  Lab 08/30/12 1743 08/29/12 0730 08/28/12 0630 08/27/12 1342  CKTOTAL 16109* 60454* 09811* 5632*  CKMB -- -- -- --  CKMBINDEX -- -- -- --  TROPONINI -- -- -- --   BNP (last 3 results) No results found for this basename: PROBNP:3 in the last 8760 hours CBG: No results found for this basename: GLUCAP:5 in the last  168 hours  Recent Results (from the past 240 hour(s))  URINE CULTURE     Status: Normal   Collection Time   08/27/12  2:33 PM      Component Value Range Status Comment   Specimen Description URINE, CATHETERIZED   Final    Special Requests ADDED 08/28/12 2013   Final    Culture  Setup Time 08/28/2012 21:20   Final    Colony Count NO GROWTH   Final    Culture NO GROWTH   Final    Report Status 08/29/2012 FINAL   Final   MRSA PCR SCREENING     Status: Normal   Collection Time   08/30/12  5:22 PM      Component Value Range Status Comment   MRSA by PCR NEGATIVE  NEGATIVE Final      Studies: No results found.  Scheduled Meds:    . docusate sodium  100 mg Oral BID  . escitalopram  20 mg Oral Daily  . Fluticasone-Salmeterol  1 puff Inhalation BID  . gabapentin  300 mg Oral TID  . levofloxacin  500 mg Oral Q1400  . loratadine  10 mg Oral Daily  . LORazepam  2 mg Intravenous Once  . methocarbamol  500 mg Oral Q6H  . metoprolol tartrate  25 mg Oral BID  . montelukast  10 mg Oral QHS   Continuous Infusions:    . sodium chloride 75 mL/hr at 09/01/12 2355    Active Problems:  Acute back pain  Rhabdomyolysis  ARF (acute renal failure)  Hypokalemia  Asthma  Hypernatremia  Metabolic acidosis  Leukocytosis  UTI (lower urinary tract infection)  Anemia  CAP (community acquired pneumonia)  Benzodiazepine withdrawal       Aldyn Toon  [facility] Pager [phone]. If 8PM-8AM, please contact night-coverage at [email or web address], password [facility] 09/02/2012, 10:59 AM  LOS: 6 days

## 2012-09-02 NOTE — Care Management Note (Signed)
    Page 1 of 2   09/05/2012     3:20:37 PM   CARE MANAGEMENT NOTE 09/05/2012  Patient:  Sherri [name], Sherri [name]   Account Number:  [id number]  Date Initiated:  09/02/2012  Documentation initiated by:  Donn Pierini  Subjective/Objective Assessment:   Pt admitted with back pain Rhabdomyolysis   ARF (acute renal failure)     Action/Plan:   PTA pt lived at home with daughter, independent   Anticipated DC Date:  09/05/2012   Anticipated DC Plan:  HOME W HOME HEALTH SERVICES      DC Planning Services  CM consult      PAC Choice  DURABLE MEDICAL EQUIPMENT  HOME HEALTH   Choice offered to / List presented to:  C-1 Patient   DME arranged  3-N-1  Levan Hurst      DME agency  [facility]     HH arranged  HH-2 PT  HH-3 OT      [facility] agency  [facility]   Status of service:  Completed, signed off Medicare Important Message given?   (If response is "NO", the following Medicare IM given date fields will be blank) Date Medicare IM given:   Date Additional Medicare IM given:    Discharge Disposition:  HOME W HOME HEALTH SERVICES  Per UR Regulation:  Reviewed for med. necessity/level of care/duration of stay  If discussed at Long Length of Stay Meetings, dates discussed:   09/03/2012  09/05/2012    Comments:  PCP- Dorothyann Peng N  09/05/12- 1330- Donn Pierini RN BSN [phone] Pt for discharge today, f/u done with CIR consult- per Genie with CIR pt does not want to go to CIR- wants to return home. Spoke with pt at bedside- confirmed with pt plan to return home- pt is OOB-chair today and has ambulated in hall. MD to order HH-PT/OT and pt would like a RW and 3n1 for home- MD to place order for DME also. List of HH agencies for [employer] given to pt- pt states that she wants to use [facility] for both [facility] and DME services. Referral called to [facility] with [facility] for [facility] and Derrian with [facility] for needed DME. Pt's sister to come transport pt home. DME to be delivered  prior to pt's discharge. HH services to begin within 24-48 hrs. post discharge.  09/02/12- 1600- Donn Pierini RN, BSN [phone] Spoke with pt regarding d/c needs- per conversation pt lives at home with her daughter who has a 2 wk old baby- she also has sisters nearby who are available to assist. Pt uses local [facility] for medication needs and will have transportation home. Pt to have another MRI today, and pt has worked with PT today who is recommending CIR- pt is interested in rehab- not at an outside rehab but would consider CIR- explained pt would need to have insurance approval and meet CIR admission requirements. Pt states that if CIR does not work out then we will look at home with [facility] services- MD please order CIR consult if agree. NCM to cont. to follow.

## 2012-09-02 NOTE — Progress Notes (Signed)
Progress Note following Consultation.  Dr. Blake Divine requests suggestion for pt who persists in panic attacks and c/o muscle spasms.  Discussed need for r/o metabolic cause for muscle impairment besides back injury.  This is very atypical response particularly when she is requested to participate in PT and follow commands.  She is also receiving two muscle relaxants.  RECOMMENDATION:  1.  Continue benzo taper from xanax to Klonopin 0.5 mg twice daily prn.   2. R/o all metabolic cause of spasm.  TSH,  Parathyroid, Calcium levels, ... 3.  Suggest SNF for PT is pt is unable to cooperate or function as inpt. 4.  MD Psychiatrist signs off. Jermya Dowding J. Ferol Luz, MD Psychiatrist  09/02/2012 11:03 AM

## 2012-09-02 NOTE — Progress Notes (Signed)
Utilization review completed.  

## 2012-09-02 NOTE — Progress Notes (Signed)
Physical Therapy Treatment Patient Details Name: Sherri [name] MRN: [id number] DOB: 1966/06/13 Today's Date: 09/02/2012 Time: 7829-5621 PT Time Calculation (min): 47 min  PT Assessment / Plan / Recommendation Comments on Treatment Session  46 yo with back pain and bil LE spasms limiting her ability to mobilize and care for herself. Noted plans for thoracic and cervical MRI and will await results as pt appears to have LE tone similar to pt's experiencing cord compression. Pt currently very limited in her mobility and requiring +2 assist. Will follow.    Follow Up Recommendations  Inpatient Rehab;Supervision/Assistance - 24 hour    Barriers to Discharge        Equipment Recommendations  Defer to next venue    Recommendations for Other Services OT consult  Frequency Min 4X/week   Plan Discharge plan remains appropriate;Frequency needs to be updated    Precautions / Restrictions Precautions Precautions: Fall   Pertinent Vitals/Pain Back and legs 8/10 (reports the least her pain gets to is 6/10) On telemetry throughout    Mobility  Bed Mobility Bed Mobility: Rolling Right;Rolling Left;Supine to Sit;Sit to Supine;Sitting - Scoot to Edge of Bed Rolling Right: 3: Mod assist Rolling Left: 4: Min assist Supine to Sit: 1: +2 Total assist;HOB elevated;With rails Supine to Sit: Patient Percentage: 30% Sitting - Scoot to Edge of Bed: 1: +2 Total assist Sitting - Scoot to Edge of Bed: Patient Percentage: 40% Sit to Supine: 1: +2 Total assist;HOB flat Sit to Supine: Patient Percentage: 20% Details for Bed Mobility Assistance: Pt becomes very anxious re: changing position of  HOB or FOB and reports these changes will worsen her spasms. She must direct her care and must go slowly or her legs shoot into extensor spasms. Transfers Transfers: Sit to Stand;Stand to Sit Sit to Stand: 1: +2 Total assist;With upper extremity assist;From bed Sit to Stand: Patient Percentage: 70% Stand to Sit: 1:  +2 Total assist;To bed Stand to Sit: Patient Percentage: 70% Details for Transfer Assistance: Pt required incr time preparing herself mentally to try to stand; while sitting EOB, her legs fluctuated between flexor and extensor spasms; once her feet were placed to her liking and bed at appropriate height, she used her extensor tone/spasms to stand. Pt with incr difficulty relaxing her legs to allow herself to sit back on EOB (pt began urinating upon standing and did not want to go to [facility] after this started prematurely) Ambulation/Gait Ambulation/Gait Assistance: Not tested (comment)    Exercises     PT Diagnosis:    PT Problem List:   PT Treatment Interventions:     PT Goals Acute Rehab PT Goals Pt will go Sit to Stand: with supervision PT Goal: Sit to Stand - Progress: Progressing toward goal Pt will go Stand to Sit: with supervision PT Goal: Stand to Sit - Progress: Progressing toward goal  Visit Information  Last PT Received On: 09/02/12 Assistance Needed: +2    Subjective Data  Subjective: Pt reports she had spasms like this 5 years ago and they decided it was due to her anxiety. Her meds were adjusted and she has not had similar spasms since then. Reports she has incr stressors recently (did not want to elaborate on these) and she thinks that is what has triggered this episode Patient Stated Goal: Get better and get home to her 57 week old grandson   Cognition  Overall Cognitive Status: Appears within functional limits for tasks assessed/performed Arousal/Alertness: Awake/alert Orientation Level: Appears intact for tasks assessed Behavior  During Session: Anxious    Balance  Balance Balance Assessed: Yes Static Sitting Balance Static Sitting - Balance Support: No upper extremity supported;Feet supported Static Sitting - Level of Assistance: 2: Max assist Static Sitting - Comment/# of Minutes: EOB >20 minutes; spasms will hit pt suddenly and she'll extend at hips and knees  with loss of balance posteriorly.  Static Standing Balance Static Standing - Balance Support: Bilateral upper extremity supported Static Standing - Level of Assistance: 1: +2 Total assist Static Standing - Comment/# of Minutes: stood x 2 minutes with pt =70%  End of Session PT - End of Session Equipment Utilized During Treatment: Gait belt Activity Tolerance: Treatment limited secondary to medical complications (Comment);Other (comment) (continued LE spasms/tone) Patient left: in bed;with call bell/phone within reach Nurse Communication: Other (comment) (incontinent of urine; pt/linens changed)   GP     Sherri [name] 09/02/2012, 3:27 PM Pager [phone]

## 2012-09-02 NOTE — Progress Notes (Signed)
CSW received referral for potential disposition needs. Per discussion with RN case manager, pt is adamantly refusing SNF. Pt is open to [facility] with Home Health Services. .No further Clinical Social Work needs, signing off.    Catha Gosselin, Theresia Majors  [phone] .09/02/2012 1557pm

## 2012-09-02 NOTE — Consult Note (Signed)
[facility] CONSULT NOTE     Reason for Consult: muscle spasm    HPI:    Sherri [name] is an 46 y.o. female known history of L5-S1 disk HNP 2007, chronic back pain since then. Patient states she had followed up with Dr. Anne Hahn of [facility] for some time for treatment of back spasms.  At that time she was placed on Valium and seemed to do well.  She continued to have spasms and states she was sent to [facility] and was seen by neurology one time.  The physician at [facility] wanted to place her on IV solumedrol.  Patient did not follow up on this.  She has also seen Dr. Lajoyce Corners for back pain and states she has received both epidural and spinal injections.  Most recently She was seen by Dr. Ophelia Charter who discussed surgery but this was postponed due to her spasms.  At home she takes Xanax for her anxiety and Vicodin for pain.    Patient stated to me she was taken off her valium in the past due to concern she was abusing the medication.  In addition, She states there is also concern that she has been abusing her Xanax.  Although she denies this, she reports she went through one months of medications in 15 days.   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  At that time she was on Soma prescribed by Dr. Anne Hahn.  In addition she did have (+) oligoclonal bands seen in CSF but no abnormal MRI findings.  She was last seen 04/08/2007 to follow up on a as needed basis.    Currently on (non home medications):  Robaxin 4500 mg every 6 hours, Flexeril 5 mg TID, Dilaudid 1 mg every 3 hours, Oxycodone 5 mg every 4 hours.  Past Medical History  Diagnosis Date  . Pernicious anemia 10/30/2011  . Lumbar herniated disc   . Environmental allergies   . Angioedema   . DVT (deep venous thrombosis)   . Asthma   . Seasonal allergies   . Urticaria   . Chronic back pain   . Anxiety   . Depression   . GERD (gastroesophageal reflux disease)   . Arthritis   .  Neuromuscular disorder   . Chronic kidney disease   . Peripheral vascular disease   . CAP (community acquired pneumonia) 08/29/2012    Past Surgical History  Procedure Date  . Hernia repair   . Inguinal hernia 1983 1983    Family History  Problem Relation Age of Onset  . Cancer Mother   . Other Mother   . COPD Father   . Asthma Brother   . Cancer Brother     Social History:  reports that she has never smoked. She does not have any smokeless tobacco history on file. She reports that she does not drink alcohol or use illicit drugs.  Allergies  Allergen Reactions  . Tylenol (Acetaminophen) Hives  . Amoxicillin Diarrhea  . Amoxicillin Diarrhea    Medications:    Prior to Admission:  Prescriptions prior to admission  Medication Sig Dispense Refill  . Albuterol Sulfate (PROAIR HFA IN) Inhale 2 puffs into the lungs every 4 (four) hours as needed. Shortness of breath or wheezing      . ALPRAZolam (XANAX) 0.5 MG tablet Take 0.5 mg by mouth 3 (  three) times daily as needed. anxiety      . cyanocobalamin (,VITAMIN B-12,) 1000 MCG/ML injection Inject 1,000 mcg into the muscle every 3 (three) months.       . desloratadine (CLARINEX) 5 MG tablet Take 1 tablet (5 mg total) by mouth daily.  30 tablet  0  . EPINEPHrine (EPI-PEN) 0.3 mg/0.3 mL DEVI Inject 0.3 mg into the muscle as needed. For life threatening allergic reaction      . escitalopram (LEXAPRO) 20 MG tablet Take 20 mg by mouth daily.        . fluticasone (FLONASE) 50 MCG/ACT nasal spray Place 2 sprays into the nose daily as needed. For allergy      . Fluticasone-Salmeterol (ADVAIR) 250-50 MCG/DOSE AEPB Inhale 2 puffs into the lungs daily. 1 puff, twice daily- rinse mouth well after use      . gabapentin (NEURONTIN) 300 MG capsule Take 300 mg by mouth 3 (three) times daily.       [name] HYDROcodone-acetaminophen (NORCO) 5-325 MG per tablet Take 1-2 tablets by mouth every 6 (six) hours as needed. For pain      . ibuprofen (ADVIL,MOTRIN)  600 MG tablet Take 600 mg by mouth every 6 (six) hours as needed. For pain      . levocetirizine (XYZAL) 5 MG tablet Take 1 tablet (5 mg total) by mouth every evening.  30 tablet  0  . montelukast (SINGULAIR) 10 MG tablet Take 10 mg by mouth at bedtime.        . naproxen sodium (ANAPROX) 220 MG tablet Take 440 mg by mouth every 12 (twelve) hours.       Scheduled:   . docusate sodium  100 mg Oral BID  . escitalopram  20 mg Oral Daily  . Fluticasone-Salmeterol  1 puff Inhalation BID  . gabapentin  300 mg Oral TID  . levofloxacin  500 mg Oral Q1400  . loratadine  10 mg Oral Daily  . LORazepam  2 mg Intravenous Once  . methocarbamol  500 mg Oral Q6H  . metoprolol tartrate  25 mg Oral BID  . montelukast  10 mg Oral QHS    Review of Systems - General ROS: negative for - chills, fatigue, fever or hot flashes Hematological and Lymphatic ROS: negative for - bruising, fatigue, jaundice or pallor Endocrine ROS: negative for - hair pattern changes, hot flashes, mood swings or skin changes Respiratory ROS: negative for - cough, hemoptysis, orthopnea or wheezing Cardiovascular ROS: negative for - dyspnea on exertion, orthopnea, palpitations or shortness of breath Gastrointestinal ROS: negative for - abdominal pain, appetite loss, blood in stools, diarrhea or hematemesis Musculoskeletal ROS: negative for - joint pain, joint stiffness, joint swelling or muscle pain Neurological ROS: positive for - weakness and spasm Dermatological ROS: negative for dry skin, pruritus and rash   Blood pressure 141/91, pulse 115, temperature 97.4 F (36.3 C), temperature source Oral, resp. rate 20, height 5' 2.5" (1.588 m), weight 82 kg (180 lb 12.4 oz), last menstrual period 07/29/2012, SpO2 94.00%.   Neurologic Examination:   Mental Status: Alert, oriented X 3.  Speech fluent without evidence of aphasia. Able to follow 3 step commands without difficulty. Memory:intact remote and distant Thought content  appropriate Cranial Nerves: II-Visual fields grossly intact. III/IV/VI-Extraocular movements intact.  Pupils reactive bilaterally. Ptosis not present. V/VII-Smile symmetric VIII-grossly intact IX/X-normal gag XI-bilateral shoulder shrug XII-midline tongue extension Motor: 5/5 bilaterally.  She will intermittently show bilateral hip spasms.  She states she has a hard  time relaxing when trying to examine tone, however I did not note and significant rigidity or spasticity in UE or LE. (later when patient was seen by Dr. Amada Jupiter, tone was increased in bilateral LE which waxed and waned. ) Sensory: Pinprick and light touch intact throughout, bilaterally Deep Tendon Reflexes:  Right: Upper Extremity   Left: Upper extremity   biceps (C-5 to C-6) 1/4   biceps (C-5 to C-6) 1/4 tricep (C7) 1/4    triceps (C7) 1/4 Brachioradialis (C6) 2/4  Brachioradialis (C6) 2/4  Lower Extremity    Lower Extremity  quadriceps (L-2 to L-4) 3/4   quadriceps (L-2 to L-4) 3/4 Achilles (S1) 2/4   Achilles (S1) 2/4      Plantars:      Right:  downgoing     Left:  Upgoing Cerebellar: Normal finger-to-nose,   heel-to-shin test limited due to limited mobility.     No results found for this basename: cbc, bmp, coags, chol, tri, ldl, hga1c    Results for orders placed during the hospital encounter of 08/27/12 (from the past 48 hour(s))  CBC     Status: Abnormal   Collection Time   09/01/12 11:04 AM      Component Value Range Comment   WBC 10.4  4.0 - 10.5 K/uL    RBC 3.70 (*) 3.87 - 5.11 MIL/uL    Hemoglobin 10.3 (*) 12.0 - 15.0 g/dL    HCT 16.1 (*) 09.6 - 46.0 %    MCV 84.1  78.0 - 100.0 fL    MCH 27.8  26.0 - 34.0 pg    MCHC 33.1  30.0 - 36.0 g/dL    RDW 04.5  40.9 - 81.1 %    Platelets 210  150 - 400 K/uL   BASIC METABOLIC PANEL     Status: Abnormal   Collection Time   09/01/12 11:04 AM      Component Value Range Comment   Sodium 138  135 - 145 mEq/L    Potassium 4.0  3.5 - 5.1 mEq/L    Chloride 106   96 - 112 mEq/L    CO2 24  19 - 32 mEq/L    Glucose, Bld 101 (*) 70 - 99 mg/dL    BUN 7  6 - 23 mg/dL    Creatinine, Ser 9.14  0.50 - 1.10 mg/dL    Calcium 9.4  8.4 - 78.2 mg/dL    GFR calc non Af Amer >90  >90 mL/min    GFR calc Af Amer >90  >90 mL/min     MRI L-Spine 08-27-12 IMPRESSION:  1. No acute findings in the lumbar spine.  2. Chronic L4-L5 and L5-S1 disc degeneration and disc herniations  affecting the right lateral recess and right neural foramen at each  level.  3. Stable mild L4-L5 spinal stenosis, multifactorial.  4. Stable lumbar facet hypertrophy.    Assessment/Plan:   46 YO female with known history of L5-S1 disc protrusion, chronic back pain and back spasms.  Patient has history of LP positive for oligoclonal bands in the past but no MRI findings.  She returns to the hospital secondary to worsening back spasms.  Given history and lack of recent CNS imaging this is could be concerning for MS flair vs. Unmasking of old symptoms due to physiological stressors(UTI, PNA).   1) MRI with and without contrast head, C-spine, thoracic spine to look for MS lesion as her previous LP showed (+) Oligoclonal bands 2) Start Baclofen 5 mg TID.  Will D/C Robaxin and Dilauded to keep from excessive drowsiness.  3) Would avoid excessive narcotics  4) PT/OT  Felicie Morn PA-C [facility] [phone]  09/02/2012, 12:14 PM  I have seen and evaluated the patient. I have reviewed the above note and made appropriate changes. Her findings of increased tone vs. persistent contraction of her legs in addition to an upgoing toe on the left make me suspicious that this represents a central process. With no new deficit that could be either an MS flair or unmasking of previous deficits(from a currently unidentified cause)  Ritta Slot, MD [facility] [phone]

## 2012-09-03 ENCOUNTER — Inpatient Hospital Stay (HOSPITAL_COMMUNITY)

## 2012-09-03 DIAGNOSIS — F19239 Other psychoactive substance dependence with withdrawal, unspecified: Secondary | ICD-10-CM

## 2012-09-03 DIAGNOSIS — IMO0002 Reserved for concepts with insufficient information to code with codable children: Secondary | ICD-10-CM

## 2012-09-03 DIAGNOSIS — F1520 Other stimulant dependence, uncomplicated: Secondary | ICD-10-CM

## 2012-09-03 DIAGNOSIS — M47817 Spondylosis without myelopathy or radiculopathy, lumbosacral region: Secondary | ICD-10-CM

## 2012-09-03 LAB — HEMOGLOBIN A1C: Hgb A1c MFr Bld: 5.3 % (ref <5.7)

## 2012-09-03 LAB — GLUCOSE, CAPILLARY: Glucose-Capillary: 123 mg/dL — ABNORMAL HIGH (ref 70–99)

## 2012-09-03 MED ORDER — GADOBENATE DIMEGLUMINE 529 MG/ML IV SOLN
15.0000 mL | Freq: Once | INTRAVENOUS | Status: AC
  Administered 2012-09-03: 15 mL via INTRAVENOUS

## 2012-09-03 MED ORDER — INSULIN ASPART 100 UNIT/ML ~~LOC~~ SOLN
0.0000 [IU] | SUBCUTANEOUS | Status: DC
  Administered 2012-09-03: 2 [IU] via SUBCUTANEOUS
  Administered 2012-09-04 (×3): 3 [IU] via SUBCUTANEOUS
  Administered 2012-09-04: 2 [IU] via SUBCUTANEOUS

## 2012-09-03 MED ORDER — LORAZEPAM 2 MG/ML IJ SOLN
2.0000 mg | Freq: Once | INTRAMUSCULAR | Status: AC
  Administered 2012-09-03: 2 mg via INTRAVENOUS
  Filled 2012-09-03: qty 1

## 2012-09-03 MED ORDER — PANTOPRAZOLE SODIUM 40 MG PO TBEC
40.0000 mg | DELAYED_RELEASE_TABLET | Freq: Every day | ORAL | Status: DC
  Administered 2012-09-04 – 2012-09-05 (×2): 40 mg via ORAL
  Filled 2012-09-03 (×2): qty 1

## 2012-09-03 MED ORDER — SODIUM CHLORIDE 0.9 % IV SOLN
500.0000 mg | INTRAVENOUS | Status: DC
  Administered 2012-09-03: 500 mg via INTRAVENOUS
  Filled 2012-09-03 (×2): qty 4

## 2012-09-03 MED ORDER — LEVOFLOXACIN 500 MG PO TABS
500.0000 mg | ORAL_TABLET | Freq: Every day | ORAL | Status: DC
  Administered 2012-09-03 – 2012-09-04 (×2): 500 mg via ORAL
  Filled 2012-09-03 (×2): qty 1

## 2012-09-03 MED ORDER — CYCLOBENZAPRINE HCL 5 MG PO TABS
7.5000 mg | ORAL_TABLET | Freq: Three times a day (TID) | ORAL | Status: DC
  Administered 2012-09-03 – 2012-09-05 (×7): 7.5 mg via ORAL
  Filled 2012-09-03 (×10): qty 1.5

## 2012-09-03 MED ORDER — BACLOFEN 10 MG PO TABS
10.0000 mg | ORAL_TABLET | Freq: Three times a day (TID) | ORAL | Status: DC
  Administered 2012-09-03 – 2012-09-04 (×4): 10 mg via ORAL
  Filled 2012-09-03 (×6): qty 1

## 2012-09-03 NOTE — Consult Note (Signed)
Physical Medicine and Rehabilitation Consult Reason for Consult: Acute back pain/radiculopathy Referring Physician: [facility]   HPI: Sherri [name] is a 46 y.o. right-handed female with history of chronic back pain as well as L5-S1 disc HNP 2007. Admitted 08/27/2012 with back pain with muscle spasms x4 days. MRI lumbar spine with no acute findings in the lumbar spine. There were chronic L4-L5 and L5-S1 disc degeneration and disc herniations affecting the right lateral recess and right neural foramen at each level. CT of the chest showed no evidence of pulmonary abscess. Consult made to Dr. Ophelia Charter to address possible need for surgery. Patient well known to Dr.Yates and noted patient had delayed any surgery for 18 months. No current plan for surgical intervention. Psychiatry services has been consulted with notable myoclonic jerks questionably related to the use of benzodiazepines resulting in withdrawal effects. Pain control remains ongoing with the use of baclofen, scheduled Flexeril, oxycodone. She is receiving Klonopin as needed as well as Ativan for arrest with his agitation. Physical therapy ongoing with recommendations of physical medicine rehabilitation consult to consider inpatient rehabilitation services   Review of Systems  Gastrointestinal: Positive for heartburn.  Musculoskeletal: Positive for back pain.       Muscle spasms  Psychiatric/Behavioral: Positive for depression.       Anxiety  All other systems reviewed and are negative.   Past Medical History  Diagnosis Date  . Pernicious anemia 10/30/2011  . Lumbar herniated disc   . Environmental allergies   . Angioedema   . DVT (deep venous thrombosis)   . Asthma   . Seasonal allergies   . Urticaria   . Chronic back pain   . Anxiety   . Depression   . GERD (gastroesophageal reflux disease)   . Arthritis   . Chronic kidney disease   . Peripheral vascular disease   . CAP (community acquired pneumonia) 08/29/2012   Past Surgical  History  Procedure Date  . Hernia repair   . Inguinal hernia 1983 1983   Family History  Problem Relation Age of Onset  . Cancer Mother   . Other Mother   . COPD Father   . Asthma Brother   . Cancer Brother    Social History:  reports that she has never smoked. She does not have any smokeless tobacco history on file. She reports that she does not drink alcohol or use illicit drugs. Allergies:  Allergies  Allergen Reactions  . Tylenol (Acetaminophen) Hives  . Amoxicillin Diarrhea  . Amoxicillin Diarrhea   Medications Prior to Admission  Medication Sig Dispense Refill  . Albuterol Sulfate (PROAIR HFA IN) Inhale 2 puffs into the lungs every 4 (four) hours as needed. Shortness of breath or wheezing      . ALPRAZolam (XANAX) 0.5 MG tablet Take 0.5 mg by mouth 3 (three) times daily as needed. anxiety      . cyanocobalamin (,VITAMIN B-12,) 1000 MCG/ML injection Inject 1,000 mcg into the muscle every 3 (three) months.       . desloratadine (CLARINEX) 5 MG tablet Take 1 tablet (5 mg total) by mouth daily.  30 tablet  0  . EPINEPHrine (EPI-PEN) 0.3 mg/0.3 mL DEVI Inject 0.3 mg into the muscle as needed. For life threatening allergic reaction      . escitalopram (LEXAPRO) 20 MG tablet Take 20 mg by mouth daily.        . fluticasone (FLONASE) 50 MCG/ACT nasal spray Place 2 sprays into the nose daily as needed. For allergy      .  Fluticasone-Salmeterol (ADVAIR) 250-50 MCG/DOSE AEPB Inhale 2 puffs into the lungs daily. 1 puff, twice daily- rinse mouth well after use      . gabapentin (NEURONTIN) 300 MG capsule Take 300 mg by mouth 3 (three) times daily.       [name] HYDROcodone-acetaminophen (NORCO) 5-325 MG per tablet Take 1-2 tablets by mouth every 6 (six) hours as needed. For pain      . ibuprofen (ADVIL,MOTRIN) 600 MG tablet Take 600 mg by mouth every 6 (six) hours as needed. For pain      . levocetirizine (XYZAL) 5 MG tablet Take 1 tablet (5 mg total) by mouth every evening.  30 tablet  0  .  montelukast (SINGULAIR) 10 MG tablet Take 10 mg by mouth at bedtime.        . naproxen sodium (ANAPROX) 220 MG tablet Take 440 mg by mouth every 12 (twelve) hours.        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 Functional Status:  Mobility: Bed Mobility Bed Mobility: Rolling Right;Rolling Left;Supine to Sit;Sit to Supine;Sitting - Scoot to Edge of Bed Rolling Right: 3: Mod assist Rolling Left: 4: Min assist Supine to Sit: 1: +2 Total assist;HOB elevated;With rails Supine to Sit: Patient Percentage: 30% Sitting - Scoot to Edge of Bed: 1: +2 Total assist Sitting - Scoot to Edge of Bed: Patient Percentage: 40% Sit to Supine: 1: +2 Total assist;HOB flat Sit to Supine: Patient Percentage: 20% Transfers Transfers: Sit to Stand;Stand to Sit Sit to Stand: 1: +2 Total assist;With upper extremity assist;From bed Sit to Stand: Patient Percentage: 70% Stand to Sit: 1: +2 Total assist;To bed Stand to Sit: Patient Percentage: 70% Ambulation/Gait Ambulation/Gait Assistance: Not tested (comment)    ADL:    Cognition: Cognition Arousal/Alertness: Awake/alert Orientation Level: Oriented X4 Cognition Overall Cognitive Status: Appears within functional limits for tasks assessed/performed Arousal/Alertness: Awake/alert Orientation Level: Appears intact for tasks assessed Behavior During Session: Anxious  Blood pressure 152/84, pulse 112, temperature 97.9 F (36.6 C),  temperature source Oral, resp. rate 18, height 5' 2.5" (1.588 m), weight 82 kg (180 lb 12.4 oz), last menstrual period 07/29/2012, SpO2 98.00%. Physical Exam  Constitutional: She is oriented to person, place, and time.  HENT:  Head: Normocephalic.  Eyes:       Pupils round and reactive to light  Neck: Neck supple. No thyromegaly present.  Cardiovascular: Normal rate and regular rhythm.   Pulmonary/Chest: Breath sounds normal. She has no wheezes.  Abdominal: Bowel sounds are normal. She exhibits no distension. There is no tenderness. There is no rebound.  Musculoskeletal:       Low back at approximately the L4-L5 levels is tender to touch, and generally central in location. No allodynia or hypersensitivity of the thighs was noted.  d Neurological: She is alert and oriented to person, place, and time.       sensitiviy along outer/anterior thighs. Left leg slightly rigid, but DTR's are trace to 1+. No sensory findings distally on theleft leg either. Strength difficult to assess due to pain.   Skin:  Patient resting in bed comfortably with friend at bedside. She did become a little more restless during physical exam. She was able to follow full commands  Psychiatric: Her mood appears anxious.    Results for orders placed during the hospital encounter of 08/27/12 (from the past 24 hour(s))  CK     Status: Abnormal   Collection Time   09/02/12  1:12 PM      Component Value Range   Total CK 4922 (*) 7 - 177 U/L  TSH     Status: Normal   Collection Time   09/02/12  1:12 PM      Component Value Range   TSH 2.375  0.350 - 4.500 uIU/mL   No results found.  Assessment/Plan: Diagnosis: lumbar spondylosis with DDD, facet arthropathy, radiculopathy with superimposed rhabdomyolysis/ picture complicated by severe spasm and pain related to all of the above 1. Does the need for close, 24 hr/day medical supervision in concert with the patient's rehab needs make it unreasonable for this patient to be  served in a less intensive setting? Potentially 2. Co-Morbidities requiring supervision/potential complications: see above 3. Due to bladder management, bowel management, safety, skin/wound care, disease management, medication administration, pain management and patient education, does the patient require 24 hr/day rehab nursing? Yes 4. Does the patient require coordinated care of a physician, rehab nurse, PT (1-2 hrs/day, 5 days/week) and OT (1-2 hrs/day, 5 days/week) to address physical and functional deficits in the context of the above medical diagnosis(es)? Potentially Addressing deficits in the following areas: balance, endurance, locomotion, strength, transferring, bowel/bladder control, bathing, dressing, feeding, grooming, toileting and psychosocial support 5. Can the patient actively participate in an intensive therapy program of at least 3 hrs of therapy per day at least 5 days per week? Potentially 6. The potential for patient to make measurable gains while on inpatient rehab is good 7. Anticipated functional outcomes upon discharge from inpatient rehab are mod I to supervision with PT, mod I to min assist with OT, n/a with SLP. 8. Estimated rehab length of stay to reach the above functional goals is: one to two weeks 9. Does the patient have adequate social supports to accommodate these discharge functional goals? Potentially 10. Anticipated D/C setting: Home 11. Anticipated post D/C treatments: HH therapy 12. Overall Rehab/Functional Prognosis: good  RECOMMENDATIONS: This patient's condition is appropriate for continued rehabilitative care in the following setting: CIR vs home with HHPT Patient has agreed to participate in recommended program. Yes and Potentially Note that insurance prior authorization may be required for reimbursement for recommended care.  Comment: Pt has multiple reasons for her pain. It appears that appropriate interventions have been initiated. Would also  consider tizanidine trial, lidoderm patches, or even perhaps scheduled valium if spasms remain severe. Additionally, mobilization and hydration are important. Will follow for progress. If pain doesn't dissipate, could consider brief CIR admit.  Ivory Broad, MD   09/03/2012

## 2012-09-03 NOTE — Progress Notes (Signed)
Physical Therapy Treatment Patient Details Name: Sherri [name] MRN: [id number] DOB: 1966/09/12 Today's Date: 09/03/2012 Time: 1328-1400 PT Time Calculation (min): 32 min  PT Assessment / Plan / Recommendation Comments on Treatment Session  pt presents with Back pain and Bil LE muscle spasms.  Noted MRI to assess for MS lesions and paperwork from [facility] in shadow Chart noting MS.  pt performs better when she is able to self-direct care and question if pt is performing to potential.  2 person A for safety.  Will follow acutely.      Follow Up Recommendations  Skilled nursing facility    Barriers to Discharge        Equipment Recommendations  Defer to next venue    Recommendations for Other Services    Frequency Min 4X/week   Plan Discharge plan needs to be updated;Frequency remains appropriate    Precautions / Restrictions Precautions Precautions: Fall Restrictions Weight Bearing Restrictions: No   Pertinent Vitals/Pain Indicates pain when LEs spasm.      Mobility  Bed Mobility Bed Mobility: Sitting - Scoot to Edge of Bed;Sit to Supine;Rolling Left;Left Sidelying to Sit Rolling Left: 4: Min guard;With rail Left Sidelying to Sit: 4: Min assist;With rails Sitting - Scoot to Edge of Bed: 4: Min assist Sit to Supine: 4: Min guard Details for Bed Mobility Assistance: pt moves very slowly and allowed to perform mobility without cueing.  pt required only A to bring trunk up to sittting and scoot to EOB.   Transfers Transfers: Sit to Stand;Stand to Sit;Stand Pivot Transfers Sit to Stand: 1: +2 Total assist;With upper extremity assist;From bed;From chair/3-in-1;With armrests Sit to Stand: Patient Percentage: 70% Stand to Sit: 1: +2 Total assist;With upper extremity assist;To chair/3-in-1;With armrests;To bed Stand to Sit: Patient Percentage: 70% Stand Pivot Transfers: 1: +2 Total assist Stand Pivot Transfers: Patient Percentage: 60% Details for Transfer Assistance: pt moves  slowly and requires increased time to prepare self and perform task.  +2 utilized for safety.   Ambulation/Gait Ambulation/Gait Assistance: Not tested (comment) Stairs: No Wheelchair Mobility Wheelchair Mobility: No    Exercises     PT Diagnosis:    PT Problem List:   PT Treatment Interventions:     PT Goals Acute Rehab PT Goals Time For Goal Achievement: 09/13/12 Potential to Achieve Goals: Fair PT Goal: Sit to Stand - Progress: Progressing toward goal PT Goal: Stand to Sit - Progress: Progressing toward goal PT Transfer Goal: Bed to Chair/Chair to Bed - Progress: Progressing toward goal  Visit Information  Last PT Received On: 09/03/12 Assistance Needed: +2 PT/OT Co-Evaluation/Treatment: Yes    Subjective Data  Subjective: pt self-directs mobility.     Cognition  Overall Cognitive Status: Appears within functional limits for tasks assessed/performed Arousal/Alertness: Awake/alert Orientation Level: Appears intact for tasks assessed Behavior During Session: [facility] for tasks performed Cognition - Other Comments: pt did not seem anxious this session, but does need to direct her care and be in control of session.      Balance  Balance Balance Assessed: Yes Static Sitting Balance Static Sitting - Balance Support: No upper extremity supported;Feet supported Static Sitting - Level of Assistance: 5: Stand by assistance Static Sitting - Comment/# of Minutes: pt able to sit EOB without muscle spasms affecting balance.  End of Session PT - End of Session Equipment Utilized During Treatment: Gait belt Activity Tolerance: Patient tolerated treatment well Patient left: in bed;with call bell/phone within reach Nurse Communication: Mobility status   GP  Sunny Schlein, [place] [phone] 09/03/2012, 3:32 PM

## 2012-09-03 NOTE — Consult Note (Signed)
Pt is down for MRI at present. We will see in the AM.   Ivory Broad, MD

## 2012-09-03 NOTE — Progress Notes (Signed)
[facility] PROGRESS NOTE    SUBJECTIVE   Patient continues to have sever LE spasms. She states over night they have progressively worsened.  She was started on Baclofen 5 mg TID and states this was of no help with controlling spasms.    OBJECTIVE   Vital signs in last 24 hours: Temp:  [97.9 F (36.6 C)-98.3 F (36.8 C)] 97.9 F (36.6 C) (09/09 2100) Pulse Rate:  [103-115] 112  (09/09 2100) Resp:  [18-20] 18  (09/09 2100) BP: (141-164)/(84-97) 152/84 mmHg (09/09 2100) SpO2:  [97 %-100 %] 98 % (09/10 0851)  Intake/Output from previous day: 09/09 0701 - 09/10 0700 In: 320 [P.O.:320] Out: 751 [Urine:751] Intake/Output this shift:   Nutritional status: General  Past Medical History  Diagnosis Date  . Pernicious anemia 10/30/2011  . Lumbar herniated disc   . Environmental allergies   . Angioedema   . DVT (deep venous thrombosis)   . Asthma   . Seasonal allergies   . Urticaria   . Chronic back pain   . Anxiety   . Depression   . GERD (gastroesophageal reflux disease)   . Arthritis   . Chronic kidney disease   . Peripheral vascular disease   . CAP (community acquired pneumonia) 08/29/2012    Neurologic ROS negative with exception of above. Musculoskeletal ROS muscle spasms and pain  Neurologic Exam:  Mental Status: Alert, oriented X 3.  Speech fluent without evidence of aphasia. Able to follow 3 step commands without difficulty. Mood: frustrated Memory:intact Thought content appropriate Cranial Nerves: II-Visual fields grossly intact. III/IV/VI-Extraocular movements intact.  Pupils reactive bilaterally. Ptosis not present. V/VII-Smile symmetric VIII-grossly intact IX/X-normal gag XI-bilateral shoulder shrug XII-midline tongue extension Motor:  Patient strength is 5/5 when she is able to give resistance.  Majority of exam patient showed increased tone due to spasms.  This was most noted in right arm and bilateral  legs.  Spasms would start in her back and she would extend her knees and ankles.  She noted significant pain when I would passively move her legs.   Sensory: Pinprick and light touch intact throughout, bilaterally Deep Tendon Reflexes:  Right: Upper Extremity   Left: Upper extremity   biceps (C-5 to C-6) 2/4   biceps (C-5 to C-6) 2/4 tricep (C7) 2/4    triceps (C7) 2/4 Brachioradialis (C6) 2/4  Brachioradialis (C6) 2/4  Lower Extremity Lower Extremity  quadriceps (L-2 to L-4) 3/4   quadriceps (L-2 to L-4) 3/4 Achilles (S1) 2/4   Achilles (S1) 2/4      Plantars:      Right:  Up going     Left:  Up going Cerebellar: Normal finger-to-nose,     Lab Results: No results found for this basename: cbc, bmp, coags, chol, tri, ldl, hga1c   Lipid Panel No results found for this basename: CHOL,TRIG,HDL,CHOLHDL,VLDL,LDLCALC in the last 72 hours  Studies/Results: No results found.  Medications:     Scheduled:   . baclofen  10 mg Oral TID  . cyclobenzaprine  5 mg Oral TID  . docusate sodium  100 mg Oral BID  . escitalopram  20 mg Oral Daily  . Fluticasone-Salmeterol  1 puff Inhalation BID  . gabapentin  300 mg Oral TID  . levofloxacin  500 mg Oral  Q1400  . loratadine  10 mg Oral Daily  . LORazepam  2 mg Intravenous Once  . metoprolol tartrate  25 mg Oral BID  . montelukast  10 mg Oral QHS  . DISCONTD: baclofen  5 mg Oral TID  . DISCONTD: methocarbamol  500 mg Oral Q6H    Assessment/Plan:   46 YO female with known history of L5-S1 disc protrusion, chronic back pain and back spasms. Patient has history of LP positive for oligoclonal bands in the past but no MRI findings. She returns to the hospital secondary to worsening back spasms. Given history and lack of recent CNS imaging this is could be concerning for MS flair vs. Unmasking of old symptoms due to physiological stressors(UTI, PNA).  1) MRI with and without contrast head, C-spine, thoracic spine to look for MS lesion as her  previous LP showed (+) Oligoclonal bands  2) Increase Baclofen to 10 mg TID and flexeril to 7.5 mg TID. Will D/C Robaxin and Dilauded to keep from excessive drowsiness.  3) Would avoid excessive narcotics  4) Will make further recommendations pending MRI findings.   I have discussed with Dr. Mena Goes PA-C [facility] [phone]  09/03/2012, 9:32 AM

## 2012-09-03 NOTE — Progress Notes (Signed)
[facility] PROGRESS NOTE  Sherri [name] ZOX:[id number] DOB: [birth year] DOA: 08/27/2012 PCP: Gwynneth Aliment, MD  Assessment/Plan: Active Problems:  Acute back pain  Rhabdomyolysis  ARF (acute renal failure)  Hypokalemia  Asthma  Hypernatremia  Metabolic acidosis  Leukocytosis  UTI (lower urinary tract infection)  Anemia  CAP (community acquired pneumonia)  Benzodiazepine withdrawal  Spasm of muscle  1. Acute back pain/Radiculopathy: Patient presents with an acute flare of her chronic low back pain, associated with paraspinal muscle spasm and bilateral LE radiculopathy. We shall admit patient, manage with analgesics, muscle relaxants and obtain lumbar MRI to define anatomy and rule out other pathology, as wcc is elevated. Clearly, she will benefit from orthopedic consultation, for possible definitive intervention, given severity of her symptoms. Started her on robaxin and flexeril. MRI of the lumbar spine shows Chronic L4-L5 and L5-S1 disc degeneration and disc herniations affecting the right lateral recess and right neural foramen at each level and Stable mild L4-L5 spinal stenosis,. Spoke to [name], recommended no surgical intervention when she is in benzo withdrawal. Recommended post poning the surgery to a later time in the future.    2. Rhabdomyolysis: Patient has a moderate rhabdomyolysis, as evidenced by [place] 81[address], and ARF. No doubt, this is due to her recurrent muscle spasms,as described above. We shall manage with iv aggressive fluids, and follow CK levels. RENAL consult called recommended bicarb drip and follow up renal parameters in the morning. Her creatinine kinase has improved to 4900s and her bicarb level is better.  3. ARF (acute renal failure): Patient came in with a creatinine of 2.07, against a known baseline creatinine of 0.83 on 01/09/12, consistent with ARF. This is secondary to rhabdomyolysis, dehydration, as well as the super-added effects of ACE-i and  NSAID therapy. These nephrotoxins will be avoided, and patient managed with iv fluids. Today creatinine is 0.73.  4. Hypokalemia: Will replete as indicated, and follow lytes.   5. Asthma: Stable/asymptomatic at this time. Will manage with bronchodilators.   6. Pernicious anemia: Patient has a known history of vitamin B12 deficiency, due to intrinsic factor antibody-positve pernicious anemia,and is on 17-monthly B12 injections. HB appears reasonable at this time.   7. Leukocytosis: with elevated pro calcitonin level.   Improving . probably secondary to CAP. UA is abnormal. Started her on IV levaquin. CXR is abnormal with evidence of left lower lobe air space disease, representing infiltrate, air fluid levels are present, ? Lung abscess, a CT chest with contrast shows possible infectious process, no abscess, treating with levaquin . Urine cultures shows no growth. .  MRI of the lumbar spine does not show any osteomyelitis or epidural abscesses.  8. Hypernatremia: probably secondary to dehydration. Resolved. Will stop Normal saline fluids and start her on dextrose 2.5/ 1/2 normal saline fluid infusion at 75 ml/hr.   9. Tachycardia: possibly from the back pain. Pain control for now. TELE monitor shows sinus tachycardia. Possibly from back spasms.  10. Anxiety and benzodiazepine abuse: Rapid response called on friday for tachycardia, spasms, sweating, hypertension, possibly from  benzodiazepine withdrawal. Her symptoms improved after a dose of IV ativan of 2 mg. 12 lead EKG shows sinus tachycardia  We have transferred the patient to step down for closer monitoring.  She reports that she has anxiety and panic attack disorder. We have discontinued the xanax and put her on clonazepam for better control of her panic attacks and anxiety as per psychiatry recommendations.   11. Back spasms: today she is  actively having spasms of the back, not improving with robaxin or flexeril. Neurology consult called for  further assistance, recommended MRI of the Brain with and without contrast  and MRI of the cervical spine to evaluate Multiple sclerosis. She was started on baclofen for the spasms.   Discussed with patient the plan of care and she agrees to  Be off the dilaudid.   Further management will depend on clinical course.    Code Status:FULL CODE Family Communication: none at bedside Disposition Plan: pt.ot evaluation recommended Inpatient Rehab. Ordered an inpatient consult.    Brief narrative:  This is a 46 year old female, with known history of L5-S1 disk HNP 2007, chronic back pain since then, s/p left great toe bunionectomy 2004, allergic rhinitis, pernicious anemia with intrinsic factor antibodies, bronchial asthma, previous angioedema/urticaria, RLE DVT 3 years ago, s/p previous hernia repair, presenting with acute flare up of back pain, accompanied by muscle spasms. According to patient, since her disk rupture in 2007, she has had episodes of severe back spasm, which occur periodically, then subside. Surgery has been discussed with her primary orthopedic surgeon, [name], and is yet to be scheduled. She woke up in AM of 8/130/13, ,had an acute onset of low back pain and muscle spasms in her low back, and this has been recurrent, since, with each episode lasting 1-5 minutes, and at varying intervals, associated with pain radiating into both lower extremities, exacerbated by attempts at movement, and planter flexion of left great toe. This is has left her quite incapacitated, and she has not been able to eat or drink much. As a matter of fact, after hours on 08/23/12, she placed a call to [name], and was requested to call back on 08/27/12, to schedule surgery. Because of severe symptoms today, she called EMS, and came to the ED.   Consultants:  [name]. For orthopedics  Nephrology consult over the phone  for rhab/ARF.  Neurology consult  Procedures:  Mri of the lumbar spine   CT chest  with contrast.  Antibiotics: levaquin. HPI/Subjective: Had a rough night yesterday . Couldn't get help to get to the bedside commode in time .  [name]  Objective: Filed Vitals:   09/02/12 0945 09/02/12 1406 09/02/12 2100 09/03/12 0851  BP: 141/91 164/97 152/84   Pulse: 115 103 112   Temp:  98.3 F (36.8 C) 97.9 F (36.6 C)   TempSrc:  Oral Oral   Resp:  20 18   Height:      Weight:      SpO2:  97% 100% 98%    Intake/Output Summary (Last 24 hours) at 09/03/12 1104 Last data filed at 09/02/12 2100  Gross per 24 hour  Intake    220 ml  Output    751 ml  Net   -531 ml   Filed Weights   08/30/12 1700  Weight: 82 kg (180 lb 12.4 oz)    Exam:   General:  Alert afebrile appears to be in mild distress from the back spasms  Cardiovascular: s1s2, tachycardic  Respiratory: CTAB  Abdomen: soft NT ND BS+  Back: paraspinal muscle spasm, lumbar tenderness.   Data Reviewed: Basic Metabolic Panel:  Lab 09/01/12 1914 08/30/12 1743 08/30/12 0620 08/29/12 0730 08/28/12 0630 08/27/12 1726 08/27/12 1148  NA 138 -- 141 142 151* -- 153*  K 4.0 3.6 2.8* 3.6 4.0 -- --  CL 106 -- 108 111 119* -- 114*  CO2 24 -- 26 19 14* -- 19  GLUCOSE 101* -- 149* 133* 63* -- 63*  BUN 7 -- 4* 10 18 -- 22  CREATININE 0.73 -- 0.86 1.03 1.32* -- 2.07*  CALCIUM 9.4 -- 8.9 9.4 8.9 -- 9.4  MG -- 1.7 -- 2.0 2.1 2.2 --  PHOS -- -- -- -- -- -- --   Liver Function Tests:  Lab 08/28/12 0630  AST 337*  ALT 67*  ALKPHOS 85  BILITOT 0.3  PROT 6.8  ALBUMIN 3.5   No results found for this basename: LIPASE:5,AMYLASE:5 in the last 168 hours No results found for this basename: AMMONIA:5 in the last 168 hours CBC:  Lab 09/01/12 1104 08/30/12 0620 08/29/12 0730 08/28/12 0630 08/27/12 1148  WBC 10.4 8.0 16.7* 24.9* 22.8*  NEUTROABS -- -- -- -- 20.8*  HGB 10.3* 10.0* 10.8* 10.7* 11.0*  HCT 31.1* 29.2* 32.8* 33.2* 33.0*  MCV 84.1 82.7 84.5 85.8 83.1  PLT 210 179 205 225 248   Cardiac Enzymes:  Lab  09/02/12 1312 08/30/12 1743 08/29/12 0730 08/28/12 0630 08/27/12 1342  CKTOTAL 4922* 40981* 19147* 82956* 5632*  CKMB -- -- -- -- --  CKMBINDEX -- -- -- -- --  TROPONINI -- -- -- -- --   BNP (last 3 results) No results found for this basename: PROBNP:3 in the last 8760 hours CBG: No results found for this basename: GLUCAP:5 in the last 168 hours  Recent Results (from the past 240 hour(s))  URINE CULTURE     Status: Normal   Collection Time   08/27/12  2:33 PM      Component Value Range Status Comment   Specimen Description URINE, CATHETERIZED   Final    Special Requests ADDED 08/28/12 2013   Final    Culture  Setup Time 08/28/2012 21:20   Final    Colony Count NO GROWTH   Final    Culture NO GROWTH   Final    Report Status 08/29/2012 FINAL   Final   MRSA PCR SCREENING     Status: Normal   Collection Time   08/30/12  5:22 PM      Component Value Range Status Comment   MRSA by PCR NEGATIVE  NEGATIVE Final      Studies: No results found.  Scheduled Meds:    . baclofen  10 mg Oral TID  . cyclobenzaprine  7.5 mg Oral TID  . docusate sodium  100 mg Oral BID  . escitalopram  20 mg Oral Daily  . Fluticasone-Salmeterol  1 puff Inhalation BID  . gabapentin  300 mg Oral TID  . levofloxacin  500 mg Oral Q1400  . loratadine  10 mg Oral Daily  . LORazepam  2 mg Intravenous Once  . metoprolol tartrate  25 mg Oral BID  . montelukast  10 mg Oral QHS  . DISCONTD: baclofen  5 mg Oral TID  . DISCONTD: cyclobenzaprine  5 mg Oral TID  . DISCONTD: methocarbamol  500 mg Oral Q6H   Continuous Infusions:    . sodium chloride 75 mL/hr at 09/02/12 1257    Active Problems:  Acute back pain  Rhabdomyolysis  ARF (acute renal failure)  Hypokalemia  Asthma  Hypernatremia  Metabolic acidosis  Leukocytosis  UTI (lower urinary tract infection)  Anemia  CAP (community acquired pneumonia)  Benzodiazepine withdrawal  Spasm of muscle       Sherri [name]  [facility] Pager  [phone]. If 8PM-8AM, please contact night-coverage at [email or web address], password [facility] 09/03/2012, 11:04 AM  LOS: 7 days

## 2012-09-03 NOTE — Evaluation (Signed)
Occupational Therapy Evaluation Patient Details Name: Sherri [name] MRN: [id number] DOB: 1966/01/09 Today's Date: 09/03/2012 Time: 1325-1401 OT Time Calculation (min): 36 min  OT Assessment / Plan / Recommendation Clinical Impression  46 yo female admitted for back and BIl LE muscle spasms. Pt demonstrates decreased mobility compared to baseline. Ot recommend SNF for d/c planning     OT Assessment  Patient needs continued OT Services    Follow Up Recommendations  Skilled nursing facility    Barriers to Discharge      Equipment Recommendations  Defer to next venue    Recommendations for Other Services    Frequency  Min 2X/week    Precautions / Restrictions Precautions Precautions: Fall Restrictions Weight Bearing Restrictions: No   Pertinent Vitals/Pain No c/o pain at this time Educated to use 3N1 with RN staff    ADL  Eating/Feeding: Performed;Set up Where Assessed - Eating/Feeding: Bed level (Rn staff reports total (A) in AM) Grooming: Performed;Wash/dry face;Supervision/safety Where Assessed - Grooming: Supported sitting Lower Body Bathing: Performed;Maximal assistance Where Assessed - Lower Body Bathing: Supported standing Upper Body Dressing: Performed;Minimal assistance Where Assessed - Upper Body Dressing: Unsupported sitting Lower Body Dressing: Performed;+1 Total assistance Where Assessed - Lower Body Dressing: Unsupported sitting Toilet Transfer: Performed;+2 Total assistance Toilet Transfer: Patient Percentage: 70% Toilet Transfer Method: Sit to stand Toilet Transfer Equipment: Raised toilet seat with arms (or 3-in-1 over toilet) Toileting - Clothing Manipulation and Hygiene: Performed;Maximal assistance Where Assessed - Glass blower/designer Manipulation and Hygiene: Sit to stand from 3-in-1 or toilet Equipment Used: Gait belt Transfers/Ambulation Related to ADLs: Pt stand pivot to 3n1 only during session.  ADL Comments: Pt able to push mesh panties down  to knee caps supine in bed. OT assisted with removal of panties over feet. Pt able to lift each foot independently to doff panties. Pt doff pad. pt with incontinence and no request to staff to change. Pt required staff to initiate the need for hygiene. pt demonstrated awarenss with verbal cues. pt incontinent with transfer to 3n1 and voiding bladder on 3n1. Pt with scratch marks mid spine and pt reports "I have hives" No skin irriration noted only scratch marks.     OT Diagnosis: Generalized weakness  OT Problem List: Decreased strength;Decreased activity tolerance;Impaired balance (sitting and/or standing);Decreased coordination;Decreased safety awareness;Decreased knowledge of use of DME or AE OT Treatment Interventions: Self-care/ADL training;Therapeutic exercise;Neuromuscular education;DME and/or AE instruction;Therapeutic activities;Patient/family education;Balance training   OT Goals Acute Rehab OT Goals OT Goal Formulation: With patient Time For Goal Achievement: 09/17/12 Potential to Achieve Goals: Good ADL Goals Pt Will Perform Grooming: with min assist;Standing at sink ADL Goal: Grooming - Progress: Goal set today Pt Will Perform Upper Body Bathing: with min assist;Supported;Standing at sink ADL Goal: Upper Body Bathing - Progress: Goal set today Pt Will Perform Lower Body Bathing: with mod assist;Sitting at sink ADL Goal: Lower Body Bathing - Progress: Goal set today Pt Will Perform Upper Body Dressing: with min assist;Supported;Sitting, chair ADL Goal: Upper Body Dressing - Progress: Goal set today Pt Will Perform Lower Body Dressing: with mod assist;Sitting, chair;Supported ADL Goal: Lower Body Dressing - Progress: Goal set today Pt Will Transfer to Toilet: with min assist;Ambulation;3-in-1 ADL Goal: Toilet Transfer - Progress: Goal set today Pt Will Perform Toileting - Clothing Manipulation: with min assist;Sitting on 3-in-1 or toilet ADL Goal: Toileting - Clothing Manipulation  - Progress: Goal set today Pt Will Perform Toileting - Hygiene: with min assist;Sit to stand from 3-in-1/toilet ADL Goal: Toileting - Hygiene -  Progress: Goal set today Miscellaneous OT Goals Miscellaneous OT Goal #1: Pt will complete bed mobility MOd I as precursor to adls HOB 20 degrees of less OT Goal: Miscellaneous Goal #1 - Progress: Goal set today  Visit Information  Last OT Received On: 09/03/12 Assistance Needed: +2 PT/OT Co-Evaluation/Treatment: Yes    Subjective Data  Subjective: "I was told not to get up without two people so they gave me that bottle and I've been using it"- pt reports using female urinal. Pt educated on having staff (A) 2 person to bedside commode. pt found to be incontinent and bed linens saturated on arrival. Pt demonstrates lack of awareness.  Patient Stated Goal: to get back home   Prior Functioning  Vision/Perception  Home Living Lives With: Daughter Available Help at Discharge: Family;Other (Comment) (daughter has 66 week old) Type of Home: House Home Access: Level entry Home Layout: Two level Alternate Level Stairs-Number of Steps: 15 Alternate Level Stairs-Rails: Left Bathroom Shower/Tub: Walk-in shower;Door [employer] Toilet: Standard Bathroom Accessibility: Yes How Accessible: Accessible via walker Home Adaptive Equipment: Straight cane Prior Function Level of Independence: Independent Able to Take Stairs?: Yes Driving: Yes 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 Pt requesting IV team continue with  treatment then c/o pain with elbow extension after iv stick. PT encouraged to do her best and pt demonstrated elbow extension with 3n1 sit<>Stand without c/o.    Extremity/Trunk Assessment Right Upper Extremity Assessment RUE ROM/Strength/Tone: Within functional levels Left Upper Extremity Assessment LUE ROM/Strength/Tone: Within functional levels   Mobility  Shoulder Instructions  Bed Mobility Bed Mobility: Sitting - Scoot to Edge of Bed;Sit to Supine;Rolling Left;Left Sidelying to Sit Rolling Left: 4: Min guard;With rail Left Sidelying to Sit: 4: Min assist;With rails Sitting - Scoot to Edge of Bed: 4: Min assist Sit to Supine: 4: Min guard Details for Bed Mobility Assistance: pt moves very slowly and allowed to perform mobility without cueing.  pt required only A to bring trunk up to sittting and scoot to EOB.   Transfers Sit to Stand: 1: +2 Total assist;With upper extremity assist;From bed;From chair/3-in-1;With armrests Sit to Stand: Patient Percentage: 70% Stand to Sit: 1: +2 Total assist;With upper extremity assist;To chair/3-in-1;With armrests;To bed Stand to Sit: Patient Percentage: 70% Details for Transfer Assistance: pt moves slowly and requires increased time to prepare self and perform task.  +2 utilized for safety.         Exercise     Balance Balance Balance Assessed: Yes Static Sitting Balance Static Sitting - Balance Support: No upper extremity supported;Feet supported Static Sitting - Level of Assistance: 5: Stand by assistance Static Sitting - Comment/# of Minutes: Pt able to sit EOB without muscle spasms affecting balance   End of Session OT - End of Session Equipment Utilized During Treatment: Gait belt Activity Tolerance: Patient tolerated treatment well Patient left: in bed;with call bell/phone within reach;with family/visitor present (cousin) Nurse Communication: Mobility status  GO     Lucile Shutters 09/03/2012, 3:58 PM Pager:  [phone]

## 2012-09-03 NOTE — Progress Notes (Signed)
Pt refused morning vital signs. Pt appears stable at this time. She complains of back pain, and has been medicated. Will continue to monitor and will try again at a later time.  Harless Litten, RN 09/03/12

## 2012-09-04 DIAGNOSIS — G8929 Other chronic pain: Secondary | ICD-10-CM

## 2012-09-04 LAB — GLUCOSE, CAPILLARY
Glucose-Capillary: 162 mg/dL — ABNORMAL HIGH (ref 70–99)
Glucose-Capillary: 122 mg/dL — ABNORMAL HIGH (ref 70–99)

## 2012-09-04 MED ORDER — BISACODYL 5 MG PO TBEC
10.0000 mg | DELAYED_RELEASE_TABLET | Freq: Once | ORAL | Status: DC
  Filled 2012-09-04: qty 2

## 2012-09-04 MED ORDER — LORAZEPAM 2 MG/ML IJ SOLN
1.0000 mg | INTRAMUSCULAR | Status: DC | PRN
  Administered 2012-09-04 – 2012-09-05 (×4): 1 mg via INTRAVENOUS
  Filled 2012-09-04 (×4): qty 1

## 2012-09-04 MED ORDER — BACLOFEN 20 MG PO TABS
20.0000 mg | ORAL_TABLET | Freq: Three times a day (TID) | ORAL | Status: DC
  Administered 2012-09-04 – 2012-09-05 (×3): 20 mg via ORAL
  Filled 2012-09-04 (×5): qty 1

## 2012-09-04 MED ORDER — LIDOCAINE 5 % EX PTCH
1.0000 | MEDICATED_PATCH | CUTANEOUS | Status: DC
  Administered 2012-09-04: 1 via TRANSDERMAL
  Filled 2012-09-04 (×2): qty 1

## 2012-09-04 MED ORDER — CLONAZEPAM 0.5 MG PO TABS
0.5000 mg | ORAL_TABLET | Freq: Two times a day (BID) | ORAL | Status: DC | PRN
  Administered 2012-09-04: 0.5 mg via ORAL
  Filled 2012-09-04: qty 1

## 2012-09-04 MED ORDER — POLYETHYLENE GLYCOL 3350 17 G PO PACK
68.0000 g | PACK | Freq: Once | ORAL | Status: AC
  Administered 2012-09-04: 17 g via ORAL
  Filled 2012-09-04: qty 4

## 2012-09-04 MED ORDER — ALBUTEROL SULFATE (5 MG/ML) 0.5% IN NEBU: 2.5000 mg | INHALATION_SOLUTION | RESPIRATORY_TRACT | Status: DC | PRN

## 2012-09-04 MED ORDER — ALBUTEROL SULFATE HFA 108 (90 BASE) MCG/ACT IN AERS
2.0000 | INHALATION_SPRAY | RESPIRATORY_TRACT | Status: DC | PRN
  Administered 2012-09-05: 2 via RESPIRATORY_TRACT
  Filled 2012-09-04: qty 6.7

## 2012-09-04 NOTE — Progress Notes (Signed)
[facility] PROGRESS NOTE    SUBJECTIVE   Patient continues to have back spasms without benefit from Solumedrol or Baclofen.   OBJECTIVE   Vital signs in last 24 hours: Temp:  [98.3 F (36.8 C)-98.5 F (36.9 C)] 98.5 F (36.9 C) (09/11 0549) Pulse Rate:  [93-112] 112  (09/11 0912) Resp:  [18] 18  (09/11 0549) BP: (123-140)/(77-104) 140/81 mmHg (09/11 0912) SpO2:  [96 %-97 %] 97 % (09/11 0549)  Intake/Output from previous day:   Intake/Output this shift:   Nutritional status: General  Past Medical History  Diagnosis Date  . Pernicious anemia 10/30/2011  . Lumbar herniated disc   . Environmental allergies   . Angioedema   . DVT (deep venous thrombosis)   . Asthma   . Seasonal allergies   . Urticaria   . Chronic back pain   . Anxiety   . Depression   . GERD (gastroesophageal reflux disease)   . Arthritis   . Chronic kidney disease   . Peripheral vascular disease   . CAP (community acquired pneumonia) 08/29/2012    Neurologic ROS negative with exception of above. Musculoskeletal ROS muscle spasms  Neurologic Exam:   Mental Status: Alert, oriented X 3.  Speech fluent without evidence of aphasia. Able to follow 3 step commands without difficulty.  Cranial Nerves: II-Visual fields grossly intact. III/IV/VI-Extraocular movements intact.  Pupils reactive bilaterally. Ptosis not present. V/VII-Smile symmetric VIII-grossly intact IX/X-normal gag XI-bilateral shoulder shrug XII-midline tongue extension Motor: 5/5 bilaterally UE  And 4/5 bilateral LE strength with intermittent bilateral LE spasms.  When patient is distracted her spasms appear to resolve.  In addition, patient is, at times, able to overcome arm spasms if she concentrates on the movement. normal tone and bulk Sensory: Pinprick and light touch intact throughout, bilaterally Deep Tendon Reflexes:  Right: Upper Extremity   Left: Upper extremity   biceps  (C-5 to C-6) 2/4   biceps (C-5 to C-6) 2/4 tricep (C7) 2/4    triceps (C7) 2/4 Brachioradialis (C6) 2/4  Brachioradialis (C6) 2/4  Lower Extremity Lower Extremity  quadriceps (L-2 to L-4) 3/4 (no clonus)  quadriceps (L-2 to L-4) 2/4 Achilles (S1) 2/4 (no clonus)    Achilles (S1) 2/4      Plantars:      Right:  downgoing     Left:  upgoing Cerebellar: Normal finger-to-nose, normal rapid alternating movements and normal heel-to-shin test.   Gait: Normal gait and station.  Lab Results: No results found for this basename: cbc, bmp, coags, chol, tri, ldl, hga1c   Lipid Panel No results found for this basename: CHOL,TRIG,HDL,CHOLHDL,VLDL,LDLCALC in the last 72 hours  Studies/Results: Mr Laqueta Jean Wo Contrast  09/04/2012  *RADIOLOGY REPORT*  Clinical Data:  Low back pain.  Left lower extremity spasms. Progressive inability to ambulate.  Lumbar puncture revealed oligoclonal bands.  Question multiple sclerosis.  MRI HEAD WITHOUT AND WITH CONTRAST MRI CERVICAL SPINE WITHOUT AND WITH CONTRAST  Technique:  Multiplanar, multiecho pulse sequences of the brain and surrounding structures, and cervical spine, to include the craniocervical junction and cervicothoracic junction, were obtained without and with intravenous contrast.  Contrast: 15mL MULTIHANCE GADOBENATE DIMEGLUMINE 529 MG/ML IV SOLN  Comparison:  01/03/2012 head CT.  04/13/2006 MR brain and cervical spine.  MRI HEAD  Findings:  The patient moved between the series.  No acute infarct.  Similar appearance of punctate/patchy white matter type hyperintensity within the frontal lobes larger on the right measuring up to 5 mm.  New from prior examination (or better visualized) is a 2 x 4 mm white matter hyperintensity right periatrial region (series 6 image 12).  These findings are nonspecific but in a patient of this age, gender and provided history raise possibility of demyelinating process such as multiple sclerosis.  These areas do not demonstrate  restricted motion or enhancement as can be seen with areas of active demyelination.  Other causes white matter type changes not entirely excluded include that secondary to; migraine headaches, vasculitis, inflammatory process, prior infection, remote trauma or small vessel disease type changes.  Prominent ossification of the falx. 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  Appearance unchanged.  Major intracranial vascular structures are patent.  Orbital structures appear be grossly within normal limits.  Minimal paranasal sinus mucosal thickening.  No hydrocephalus.  IMPRESSION: Minimal nonspecific white matter type changes as noted above.  MRI CERVICAL SPINE  Findings: Motion degraded exam.  Decreased signal intensity of bone marrow.  This was noted previously and may be related to the patient's habitus. Correlation with CBC to exclude underlying anemia contributing to this appearance can be performed.  No focal cord signal abnormality or abnormal enhancement.  Scoliosis with superimposed degenerative changes including:  C2-3:  Minimal bulge.  C3-4:  Broad-based disc osteophyte greater to the right.  Mild spinal stenosis.  Moderate right-sided and mild left-sided foraminal narrowing.  C4-5:  Mild bulge.  Mild foraminal narrowing greater on the right.  C5-6:  Right posterior lateral broad-based disc protrusion with right-sided cord compression. Moderate bilateral foramen greater on the right.  C6-7:  Right posterior lateral focal disc protrusion.  Compression of the right lateral aspect of the cord.  Moderate foraminal narrowing greater on the right.  C7-T1:  Mild bilateral foraminal narrowing greater on the right. Mild bulge.  IMPRESSION: No focal cervical cord signal abnormality.  Scoliosis with cervical spondylotic changes most notable on the right at the C5-6 and C6-7 level as detailed above.  Degenerative changes have progressed slightly since prior exam.  Decreased signal intensity of  bone marrow.  Please see above discussion.  MRI THORACIC SPINE WITHOUT AND WITH CONTRAST  Technique:  Multiplanar and multiecho pulse sequences of the thoracic spine were obtained without and with intravenous contrast.  Comparison:  No comparison MR of the thoracic spine.  Comparison chest CT 08/29/2012.  Findings:  Motion degraded exam.  Decreased signal intensity of bone marrow may be related to the patient's habitus.  Correlation with CBC to exclude underlying anemia contributing to this appearance can be performed.  No focal thoracic cord signal abnormality or enhancement.  Scoliosis with scattered mild degenerative changes without significant disc herniation/ thoracic cord compression.  Hiatal hernia.  Dependent atelectasis/small pleural effusions. Parenchymal changes upper lobe.  Please see prior CT report.  IMPRESSION: Motion degraded exam.  No focal thoracic cord signal abnormality or enhancement.  Scoliosis with scattered mild degenerative changes without significant disc herniation/ thoracic cord compression.  Hiatal hernia.  Dependent atelectasis/small pleural effusions. Parenchymal changes upper lobe.  Please see prior CT report.   Original Report Authenticated By: Fuller Canada, M.D.    Mr Cervical Spine W Wo Contrast  09/04/2012  *RADIOLOGY REPORT*  Clinical Data:  Low back pain.  Left lower extremity spasms. Progressive inability to ambulate.  Lumbar puncture revealed oligoclonal bands.  Question multiple sclerosis.  MRI HEAD WITHOUT AND WITH CONTRAST MRI CERVICAL SPINE WITHOUT AND WITH CONTRAST  Technique:  Multiplanar, multiecho pulse sequences of the brain and surrounding structures, and cervical spine, to include the craniocervical junction and cervicothoracic junction, were obtained without and with intravenous contrast.  Contrast: 15mL MULTIHANCE GADOBENATE DIMEGLUMINE 529 MG/ML IV SOLN  Comparison:  01/03/2012 head CT.  04/13/2006 MR brain and cervical spine.  MRI HEAD  Findings:  The  patient moved between the series.  No acute infarct.  Similar appearance of punctate/patchy white matter type hyperintensity within the frontal lobes larger on the right measuring up to 5 mm.  New from prior examination (or better visualized) is a 2 x 4 mm white matter hyperintensity right periatrial region (series 6 image 12).  These findings are nonspecific but in a patient of this age, gender and provided history raise possibility of demyelinating process such as multiple sclerosis.  These areas do not demonstrate restricted motion or enhancement as can be seen with areas of active demyelination.  Other causes white matter type changes not entirely excluded include that secondary to; migraine headaches, vasculitis, inflammatory process, prior infection, remote trauma or small vessel disease type changes.  Prominent ossification of the falx. 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  Appearance unchanged.  Major intracranial vascular structures are patent.  Orbital structures appear be grossly within normal limits.  Minimal paranasal sinus mucosal thickening.  No hydrocephalus.  IMPRESSION: Minimal nonspecific white matter type changes as noted above.  MRI CERVICAL SPINE  Findings: Motion degraded exam.  Decreased signal intensity of bone marrow.  This was noted previously and may be related to the patient's habitus. Correlation with CBC to exclude underlying anemia contributing to this appearance can be performed.  No focal cord signal abnormality or abnormal enhancement.  Scoliosis with superimposed degenerative changes including:  C2-3:  Minimal bulge.  C3-4:  Broad-based disc osteophyte greater to the right.  Mild spinal stenosis.  Moderate right-sided and mild left-sided foraminal narrowing.  C4-5:  Mild bulge.  Mild foraminal narrowing greater on the right.  C5-6:  Right posterior lateral broad-based disc protrusion with right-sided cord compression. Moderate bilateral foramen greater  on the right.  C6-7:  Right posterior lateral focal disc protrusion.  Compression of the right lateral aspect of the cord.  Moderate foraminal narrowing greater on the right.  C7-T1:  Mild bilateral foraminal narrowing greater on the right. Mild bulge.  IMPRESSION: No focal cervical cord signal abnormality.  Scoliosis with cervical spondylotic changes most notable on the right at the C5-6 and C6-7 level as detailed above.  Degenerative changes have progressed slightly since prior exam.  Decreased signal intensity of bone marrow.  Please see above discussion.  MRI THORACIC SPINE WITHOUT AND WITH CONTRAST  Technique:  Multiplanar and multiecho pulse sequences of the thoracic spine were obtained without and with intravenous contrast.  Comparison:  No comparison MR of the thoracic spine.  Comparison chest CT 08/29/2012.  Findings:  Motion degraded exam.  Decreased signal intensity of bone marrow may be related to the patient's habitus.  Correlation with CBC to exclude underlying anemia contributing to this appearance can be performed.  No focal thoracic cord signal abnormality or enhancement.  Scoliosis with scattered mild degenerative changes without significant disc herniation/ thoracic cord compression.  Hiatal hernia.  Dependent atelectasis/small pleural effusions. Parenchymal changes upper lobe.  Please see prior CT report.  IMPRESSION: Motion degraded exam.  No focal thoracic cord signal abnormality or enhancement.  Scoliosis with scattered mild degenerative changes without significant disc herniation/ thoracic cord compression.  Hiatal hernia.  Dependent atelectasis/small pleural effusions. Parenchymal changes upper lobe.  Please see prior CT report.   Original Report Authenticated By: Fuller Canada, M.D.    Mr Thoracic Spine W Wo Contrast  09/04/2012  *RADIOLOGY REPORT*  Clinical Data:  Low back pain.  Left lower extremity spasms. Progressive inability to ambulate.  Lumbar puncture revealed oligoclonal  bands.  Question multiple sclerosis.  MRI HEAD WITHOUT AND WITH CONTRAST MRI CERVICAL SPINE WITHOUT AND WITH CONTRAST  Technique:  Multiplanar, multiecho pulse sequences of the brain and surrounding structures, and cervical spine, to include the craniocervical junction and cervicothoracic junction, were obtained without and with intravenous contrast.  Contrast: 15mL MULTIHANCE GADOBENATE DIMEGLUMINE 529 MG/ML IV SOLN  Comparison:  01/03/2012 head CT.  04/13/2006 MR brain and cervical spine.    MRI HEAD  Findings:  The patient moved between the series.  No acute infarct.  Similar appearance of punctate/patchy white matter type hyperintensity within the frontal lobes larger on the right measuring up to 5 mm.  New from prior examination (or better visualized) is a 2 x 4 mm white matter hyperintensity right periatrial region (series 6 image 12).  These findings are nonspecific but in a patient of this age, gender and provided history raise possibility of demyelinating process such as multiple sclerosis.  These areas do not demonstrate restricted motion or enhancement as can be seen with areas of active demyelination.  Other causes white matter type changes not entirely excluded include that secondary to; migraine headaches, vasculitis, inflammatory process, prior infection, remote trauma or small vessel disease type changes.  Prominent ossification of the falx. 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  Appearance unchanged.  Major intracranial vascular structures are patent.  Orbital structures appear be grossly within normal limits.  Minimal paranasal sinus mucosal thickening.  No hydrocephalus.  IMPRESSION: Minimal nonspecific white matter type changes as noted above.     MRI CERVICAL SPINE  Findings: Motion degraded exam.  Decreased signal intensity of bone marrow.  This was noted previously and may be related to the patient's habitus. Correlation with CBC to exclude underlying anemia  contributing to this appearance can be performed.  No focal cord signal abnormality or abnormal enhancement.  Scoliosis with superimposed degenerative changes including:  C2-3:  Minimal bulge.  C3-4:  Broad-based disc osteophyte greater to the right.  Mild spinal stenosis.  Moderate right-sided and mild left-sided foraminal narrowing.  C4-5:  Mild bulge.  Mild foraminal narrowing greater on the right.  C5-6:  Right posterior lateral broad-based disc protrusion with right-sided cord compression. Moderate bilateral foramen greater on the right.  C6-7:  Right posterior lateral focal disc protrusion.  Compression of the right lateral aspect of the cord.  Moderate foraminal narrowing greater on the right.  C7-T1:  Mild bilateral foraminal narrowing greater on the right. Mild bulge.  IMPRESSION: No focal cervical cord signal abnormality.  Scoliosis with cervical spondylotic changes most notable on the right at the C5-6 and C6-7 level as detailed above.  Degenerative changes have progressed slightly since prior exam.  Decreased signal intensity of bone marrow.  Please see above discussion.    MRI THORACIC SPINE WITHOUT AND WITH CONTRAST  Technique:  Multiplanar and multiecho pulse sequences of the thoracic spine were obtained without and with intravenous contrast.  Comparison:  No comparison MR of the thoracic  spine.  Comparison chest CT 08/29/2012.  Findings:  Motion degraded exam.  Decreased signal intensity of bone marrow may be related to the patient's habitus.  Correlation with CBC to exclude underlying anemia contributing to this appearance can be performed.  No focal thoracic cord signal abnormality or enhancement.  Scoliosis with scattered mild degenerative changes without significant disc herniation/ thoracic cord compression.  Hiatal hernia.  Dependent atelectasis/small pleural effusions. Parenchymal changes upper lobe.  Please see prior CT report.  IMPRESSION: Motion degraded exam.  No focal thoracic cord  signal abnormality or enhancement.  Scoliosis with scattered mild degenerative changes without significant disc herniation/ thoracic cord compression.  Hiatal hernia.  Dependent atelectasis/small pleural effusions. Parenchymal changes upper lobe.  Please see prior CT report.   Original Report Authenticated By: Fuller Canada, M.D.     Medications:     Scheduled:   . baclofen  10 mg Oral TID  . cyclobenzaprine  7.5 mg Oral TID  . docusate sodium  100 mg Oral BID  . escitalopram  20 mg Oral Daily  . Fluticasone-Salmeterol  1 puff Inhalation BID  . gabapentin  300 mg Oral TID  . gadobenate dimeglumine  15 mL Intravenous Once  . insulin aspart  0-15 Units Subcutaneous Q4H  . levofloxacin  500 mg Oral Q1400  . loratadine  10 mg Oral Daily  . LORazepam  2 mg Intravenous Once  . metoprolol tartrate  25 mg Oral BID  . montelukast  10 mg Oral QHS  . pantoprazole  40 mg Oral Q0600  . DISCONTD: LORazepam  2 mg Intravenous Once  . DISCONTD: methylPREDNISolone (SOLU-MEDROL) injection  500 mg Intravenous Q24H    Assessment/Plan:     46 YO female with known history of L5-S1 disc protrusion, chronic back pain and back spasms. Patient has history of LP positive for oligoclonal bands in the past but no MRI findings. Repeat MRI brain, C-Spine, T-Spine, L-Spine  Showed no focal enhancement or abnormality other than known L5- S1 disc bulge. Patient was started on prophylactic Solumedrol in case this was a MS flair prior to MRI but with no lesions on MRI Solumedrol has been stopped.  Patient continued to have back spasms while on Baclofen and Flexeril.  She did have some benefit from Valium and Clonazepam in past but there is question of abuse with these medications. At this time no focal neurological source of her UE and bilateral lower extremity spasms have not been found.   Recommend: 1) Continue to treat back spasms with muscle relaxants  2) PT/OT 3) Patient to Follow up with Dr. Anne Hahn or [facility] for  further management of spasms, (possibly Botox injections) and further neurological managment. Patient may also benefit from out patient EMG/NCV studies to further evaluate cause of muscle spasms.   No further in patient neurological recommendations. Neurology will Sign off.    Felicie Morn PA-C [facility] [phone]  09/04/2012, 11:53 AM

## 2012-09-04 NOTE — Progress Notes (Addendum)
[facility] PROGRESS NOTE  Abir Eroh UJW:[id number] DOB: June 06, 1966 DOA: 08/27/2012 PCP: Gwynneth Aliment, MD  Assessment/Plan: Active Problems:  Acute back pain  Rhabdomyolysis  ARF (acute renal failure)  Hypokalemia  Asthma  Hypernatremia  Metabolic acidosis  Leukocytosis  UTI (lower urinary tract infection)  Anemia  CAP (community acquired pneumonia)  Benzodiazepine withdrawal  Spasm of muscle  1. Acute back pain/Radiculopathy:  Patient presents with an acute flare of her chronic low back pain, associated with paraspinal muscle spasm and bilateral LE radiculopathy.  MRI of spine revealed chronic L4-L5 and L5-S1 disc degeneration and disc herniations affecting the right lateral recess and right neural foramen at each level and stable mild L4-L5 spinal stenosis.  [name], orthopedics, recommended no surgical intervention at this time as patient is withdrawing from benzodiazepines.  Neurology consulted because of a history of possible MS due to oligoclonal bands and previous LP, however, MRI of brain and spinal cord negative for MS lesions.  Although she was started on solumedrol burst pending results of imaging, this was discontinued today per neurology -  Follow up with outpatient orthopedics and neurology  -  Start lidocaine patch in addition to other therapeutics.  2. Rhabdomyolysis:  Resolving.  Patient has a moderate rhabdomyolysis, as evidenced by a CK of 56213, and ARF, likely due to recurrent muscle spasms,as described above.  RENAL consulted.  Patient s/p bicarb gtt, now improved.    3. ARF (acute renal failure):  Resolved.  Patient came in with a creatinine of 2.07, against a known baseline creatinine of 0.83 on 01/09/12, consistent with ARF. This is secondary to rhabdomyolysis, dehydration, as well as the super-added effects of ACE-i and NSAID therapy. These nephrotoxins will be avoided, and patient managed with iv fluids. Creatinine at baseline.   4. Hypokalemia:   Resolved.     5. Asthma: Stable/asymptomatic at this time. Will manage with bronchodilators.   6. Pernicious anemia: Patient has a known history of vitamin B12 deficiency, due to intrinsic factor antibody-positve pernicious anemia,and is on 55-monthly B12 injections. HB appears reasonable at this time.   7. Leukocytosis: with elevated pro calcitonin level.   Improving . probably secondary to LLL CAP.  No evidence of abscess on CT chest. UA is abnormal, however, UCx NGTD. MRI of the lumbar spine does not show any osteomyelitis or epidural abscesses.  S/p 8 days of levofloxacin.  DC today.  WBC stable.  8. Hypernatremia: probably secondary to dehydration. Resolved.  9. Tachycardia: possibly from the back pain and benzodiazepine withdrawal. Pain control for now. TELE monitor shows sinus tachycardia. Possibly from back spasms. -  Increase benzodiazepine  10. Anxiety and benzodiazepine abuse: Rapid response called on friday for tachycardia, spasms, sweating, hypertension, possibly from  benzodiazepine withdrawal. Her symptoms improved after a dose of IV ativan of 2 mg. 12 lead EKG shows sinus tachycardia  We have transferred the patient to step down for closer monitoring.  She reports that she has anxiety and panic attack disorder. We have discontinued the xanax and put her on clonazepam for better control of her panic attacks and anxiety as per psychiatry recommendations.   11. Back spasms: today she is actively having spasms of the back, not improving with robaxin or flexeril.   -  Increased flexeril and continue robaxin.   -  Follow up with outpatient neurology.  12.  Constipation -  miralax and bisacodyl  Per previous notes, patient agrees to be off the dilaudid.   Code Status:FULL CODE Family Communication:  none at bedside Disposition Plan: Possibly to Inpatient Rehab.  Otherwise, to home as patient refuses SNF.    Brief narrative:  This is a 46 year old female, with known history of  L5-S1 disk HNP 2007, chronic back pain since then, s/p left great toe bunionectomy 2004, allergic rhinitis, pernicious anemia with intrinsic factor antibodies, bronchial asthma, previous angioedema/urticaria, RLE DVT 3 years ago, s/p previous hernia repair, presenting with acute flare up of back pain, accompanied by muscle spasms. According to patient, since her disk rupture in 2007, she has had episodes of severe back spasm, which occur periodically, then subside. Surgery has been discussed with her primary orthopedic surgeon, [name], and is yet to be scheduled. She woke up in AM of 8/130/13, ,had an acute onset of low back pain and muscle spasms in her low back, and this has been recurrent, since, with each episode lasting 1-5 minutes, and at varying intervals, associated with pain radiating into both lower extremities, exacerbated by attempts at movement, and planter flexion of left great toe. This is has left her quite incapacitated, and she has not been able to eat or drink much. As a matter of fact, after hours on 08/23/12, she placed a call to [name], and was requested to call back on 08/27/12, to schedule surgery. Because of severe symptoms today, she called EMS, and came to the ED.   Consultants:  [name]. For orthopedics  Nephrology consult over the phone  for rhab/ARF.  Neurology consult  Procedures:  Mri of the lumbar spine   CT chest with contrast.  Antibiotics: levaquin 9/4 >> 9/11  HPI/Subjective: Continues to endorse back spasms.  Denies chest pain and shortness of breath, but is currently on nasal canula.  Endorses constipation.    .  Objective: Filed Vitals:   09/03/12 0851 09/03/12 1400 09/03/12 2100 09/04/12 0549  BP:  123/77 137/88 127/104  Pulse:  93 102 100  Temp:  98.4 F (36.9 C) 98.3 F (36.8 C) 98.5 F (36.9 C)  TempSrc:  Oral    Resp:  18 18 18   Height:      Weight:      SpO2: 98% 96% 96% 97%   No intake or output data in the 24 hours ending  09/04/12 0818 Filed Weights   08/30/12 1700  Weight: 82 kg (180 lb 12.4 oz)    Exam:   General:  Alert afebrile appears to be in mild distress from the back spasms.  Refuses to sit up for examination secondary to spasms  HEENT:  MMM  Skin:  diaphoretic  Cardiovascular: s1s2, tachycardic  Respiratory: CTAB  Abdomen: soft NT ND BS+  Back:  Patient refuses to move for examination of back.     Data Reviewed: Basic Metabolic Panel:  Lab 09/01/12 1610 08/30/12 1743 08/30/12 0620 08/29/12 0730  NA 138 -- 141 142  K 4.0 3.6 2.8* 3.6  CL 106 -- 108 111  CO2 24 -- 26 19  GLUCOSE 101* -- 149* 133*  BUN 7 -- 4* 10  CREATININE 0.73 -- 0.86 1.03  CALCIUM 9.4 -- 8.9 9.4  MG -- 1.7 -- 2.0  PHOS -- -- -- --   Liver Function Tests: No results found for this basename: AST:5,ALT:5,ALKPHOS:5,BILITOT:5,PROT:5,ALBUMIN:5 in the last 168 hours No results found for this basename: LIPASE:5,AMYLASE:5 in the last 168 hours No results found for this basename: AMMONIA:5 in the last 168 hours CBC:  Lab 09/01/12 1104 08/30/12 0620 08/29/12 0730  WBC 10.4  8.0 16.7*  NEUTROABS -- -- --  HGB 10.3* 10.0* 10.8*  HCT 31.1* 29.2* 32.8*  MCV 84.1 82.7 84.5  PLT 210 179 205   Cardiac Enzymes:  Lab 09/02/12 1312 08/30/12 1743 08/29/12 0730  CKTOTAL 4922* 12551* 17892*  CKMB -- -- --  CKMBINDEX -- -- --  TROPONINI -- -- --   BNP (last 3 results) No results found for this basename: PROBNP:3 in the last 8760 hours CBG:  Lab 09/04/12 0728 09/04/12 0431 09/04/12 0005 09/03/12 2026  GLUCAP 162* 181* 153* 123*    Recent Results (from the past 240 hour(s))  URINE CULTURE     Status: Normal   Collection Time   08/27/12  2:33 PM      Component Value Range Status Comment   Specimen Description URINE, CATHETERIZED   Final    Special Requests ADDED 08/28/12 2013   Final    Culture  Setup Time 08/28/2012 21:20   Final    Colony Count NO GROWTH   Final    Culture NO GROWTH   Final    Report Status  08/29/2012 FINAL   Final   MRSA PCR SCREENING     Status: Normal   Collection Time   08/30/12  5:22 PM      Component Value Range Status Comment   MRSA by PCR NEGATIVE  NEGATIVE Final      Studies: No results found.  Scheduled Meds:    . baclofen  10 mg Oral TID  . cyclobenzaprine  7.5 mg Oral TID  . docusate sodium  100 mg Oral BID  . escitalopram  20 mg Oral Daily  . Fluticasone-Salmeterol  1 puff Inhalation BID  . gabapentin  300 mg Oral TID  . gadobenate dimeglumine  15 mL Intravenous Once  . insulin aspart  0-15 Units Subcutaneous Q4H  . levofloxacin  500 mg Oral Q1400  . loratadine  10 mg Oral Daily  . LORazepam  2 mg Intravenous Once  . methylPREDNISolone (SOLU-MEDROL) injection  500 mg Intravenous Q24H  . metoprolol tartrate  25 mg Oral BID  . montelukast  10 mg Oral QHS  . pantoprazole  40 mg Oral Q0600  . DISCONTD: baclofen  5 mg Oral TID  . DISCONTD: cyclobenzaprine  5 mg Oral TID  . DISCONTD: levofloxacin  500 mg Oral Q1400  . DISCONTD: LORazepam  2 mg Intravenous Once   Continuous Infusions:    . sodium chloride 75 mL/hr at 09/03/12 1222    Active Problems:  Acute back pain  Rhabdomyolysis  ARF (acute renal failure)  Hypokalemia  Asthma  Hypernatremia  Metabolic acidosis  Leukocytosis  UTI (lower urinary tract infection)  Anemia  CAP (community acquired pneumonia)  Benzodiazepine withdrawal  Spasm of muscle       Sarenity Ramaker  [facility] Pager [phone]. If 8PM-8AM, please contact night-coverage at [email or web address], password [facility] 09/04/2012, 8:18 AM  LOS: 8 days

## 2012-09-04 NOTE — Progress Notes (Signed)
PT Cancellation Note  Treatment cancelled today due to pt c/o too painful.  Will try another time.    Sunny Schlein, [place] [phone] 09/04/2012, 2:12 PM

## 2012-09-05 LAB — GLUCOSE, CAPILLARY
Glucose-Capillary: 110 mg/dL — ABNORMAL HIGH (ref 70–99)
Glucose-Capillary: 120 mg/dL — ABNORMAL HIGH (ref 70–99)
Glucose-Capillary: 96 mg/dL (ref 70–99)

## 2012-09-05 MED ORDER — PANTOPRAZOLE SODIUM 40 MG PO TBEC: 40.0000 mg | DELAYED_RELEASE_TABLET | Freq: Every day | ORAL | Status: DC

## 2012-09-05 MED ORDER — BACLOFEN 20 MG PO TABS: 20.0000 mg | ORAL_TABLET | Freq: Three times a day (TID) | ORAL | Status: AC

## 2012-09-05 MED ORDER — CYCLOBENZAPRINE HCL 10 MG PO TABS: 10.0000 mg | ORAL_TABLET | Freq: Three times a day (TID) | ORAL | Status: AC

## 2012-09-05 MED ORDER — METOPROLOL TARTRATE 25 MG PO TABS: 50.0000 mg | ORAL_TABLET | Freq: Two times a day (BID) | ORAL | Status: DC

## 2012-09-05 MED ORDER — LIDOCAINE 5 % EX PTCH: 1.0000 | MEDICATED_PATCH | CUTANEOUS | Status: AC

## 2012-09-05 MED ORDER — CLONAZEPAM 0.5 MG PO TABS: 0.5000 mg | ORAL_TABLET | Freq: Two times a day (BID) | ORAL | Status: DC | PRN

## 2012-09-05 NOTE — Progress Notes (Signed)
Rehab admissions - Evaluated for possible admission.  I met with patient this am.  She wants to discharge directly home with [facility] follow-up.  She says sister and family can assist.  She is currently sitting up in chair and looks comfortable.  I called case manager to update her.  Call me for questions.  #[phone]

## 2012-09-05 NOTE — Progress Notes (Signed)
D/c orders received;IV removed with gauze on, pt remains in stable condition, pt meds and instructions reviewed and given to pt; pt d/c with 3-n-1 and walker; pt d/c to home

## 2012-09-05 NOTE — Progress Notes (Signed)
HOME HEALTH AGENCIES SERVING [place]   Agencies that are Medicare-Certified and are affiliated with [facility]  Telephone Number Address  [facility]   [facility] has ownership interest in this company; however, you are under no obligation to use this agency. [phone] or  [phone] [address] [place], [place] [address] [email or web address]   Agencies that are Medicare-Certified and are not affiliated with [facility]                                                                                 Home Health Agency Telephone Number Address  [facility] [phone] Fax [phone] [address] [place], [place]  [address] [email or web address]  [facility] [phone] or [phone] Fax [phone] [address] [place], [place] [address] [email or web address]  [facility] [phone] Fax [phone] [address] [place], [place] [address] [email or web address]  [facility] [phone] Fax [phone] [address], [address] [place], [place]  [address] [email or web address]  [facility] [phone] Fax [phone] [address] [place], [place] [address] [email or web address]  [facility] [phone] [address] [place], [place] [address] [email or web address]  [facility] [phone]  [address] [place], [place] [address] [email or web address]  [facility] [phone] or [phone] Fax [phone] [address] [place], [place]  [address] [email or web address]  [facility] [phone] Fax [phone] [address] [place], [place]  [address]  [facility]  [phone] Fax [phone] [address] [place], [place] [address] [email or web address]       Agencies that are not Medicare-Certified and are not affiliated with [facility]   Home Health Agency Telephone Number Address  [facility] [phone] or [phone] Fax [phone] [address] [place], [place]  [address] [email or web address]  [facility] [phone] Fax [phone] [address] [place], [place]  [address] [email or web address]  [facility]  [phone] Fax [phone] [address] [place], [place] [address] [email or web address]  [facility] [phone] Fax [phone] [address] [place], [place] [address]  [facility] [phone] or [phone] Fax [phone] [address] [place], [place]  [address] [email or web address]  [facility] [phone] or [phone] Fax [phone] [address] [place], [place]  [address] [email or web address]  [facility] [phone] Fax [phone] [address] [place], [place]  [address] [email or web address]  [facility] [phone] [address] [place], [place]  [address]  [facility] [phone] Fax [phone] [address] [place], [place]  [address]  [facility] [phone] Fax [phone] [address] [place], [place]  [address] [email or web address]  [facility] [phone] Fax [phone] [address] [place],  [address] [email or web address]  [facility] [phone] Fax [phone] [address] [place],   [address] [email or web address]    

## 2012-09-05 NOTE — Discharge Summary (Signed)
Physician Discharge Summary  Sherri [name] UJW:[id number] DOB: 08-Aug-1966 DOA: 08/27/2012  PCP: Gwynneth Aliment, MD  Admit date: 08/27/2012 Discharge date: 09/05/2012  Recommendations for Outpatient Follow-up:  1. Home health physical therapy and occupational therapy 2. Follow up with primary care doctor within 5 days of discharge for repeat vital signs, CBC, and BMP. 3. Follow up with Dr. Ophelia Charter within 1 month to reschedule surgery 4. Follow up with your Neurologist within 1 month to clarify diagnosis of MS.  Discharge Diagnoses:  Active Problems:  Acute back pain  Rhabdomyolysis  ARF (acute renal failure)  Hypokalemia  Asthma  Hypernatremia  Metabolic acidosis  Leukocytosis  UTI (lower urinary tract infection)  Anemia  CAP (community acquired pneumonia)  Benzodiazepine withdrawal  Spasm of muscle   Discharge Condition: stable, improved  Diet recommendation:  Regular   Wt Readings from Last 3 Encounters:  08/30/12 82 kg (180 lb 12.4 oz)  07/29/12 79.833 kg (176 lb)  05/06/12 74.844 kg (165 lb)    History of present illness:   This is a 46 year old female, with known history of L5-S1 disk HNP 2007, chronic back pain since then, s/p left great toe bunionectomy 2004, allergic rhinitis, pernicious anemia with intrinsic factor antibodies, bronchial asthma, previous angioedema/urticaria, RLE DVT 3 years ago, s/p previous hernia repair, presenting with acute flare up of back pain, accompanied by muscle spasms. According to patient, since her disk rupture in 2007, she has had episodes of severe back spasm, which occur periodically, then subside. Surgery has been discussed with her primary orthopedic surgeon, [name], and is yet to be scheduled. She woke up in AM of 8/130/13, ,had an acute onset of low back pain and muscle spasms in her low back, and this has been recurrent, since, with each episode lasting 1-5 minutes, and at varying intervals, associated with pain radiating into both  lower extremities, exacerbated by attempts at movement, and planter flexion of left great toe. This is has left her quite incapacitated, and she has not been able to eat or drink much. As a matter of fact, after hours on 08/23/12, she placed a call to [name], and was requested to call back on 08/27/12, to schedule surgery. Because of severe symptoms today, she called EMS, and came to the ED.    Hospital Course:   1. Acute back pain/Radiculopathy: Patient presented with an acute flare of her chronic low back pain, associated with paraspinal muscle spasm and bilateral LE radiculopathy. MRI of spine revealed chronic L4-L5 and L5-S1 disc degeneration and disc herniations affecting the right lateral recess and right neural foramen at each level and stable mild L4-L5 spinal stenosis. [name], orthopedics, recommended no surgical intervention at this time as patient is withdrawing from benzodiazepines. Neurology consulted because of a history of possible MS due to oligoclonal bands and previous LP, however, MRI of brain and spinal cord negative for MS lesions. Although she was started on solumedrol burst pending results of imaging, this was discontinued on 9/11 per neurology.  She should follow up with orthopedics as an outpatient and with her [facility] Neurologist also.    2. Rhabdomyolysis: Resolving. Patient had moderate rhabdomyolysis, as evidenced by a CK of 56213, and mild ARF, likely due to recurrent muscle spasms. RENAL was consulted and she was briefly placed on a bicarb gtt.  CK trended down and bicarb gtt was discontinued.   3. ARF (acute renal failure): Resolved. Patient came in with a creatinine of 2.07, against a known  baseline creatinine of 0.83 on 01/09/12, consistent with ARF. This is secondary to rhabdomyolysis, dehydration, as well as the super-added effects of ACE-i and NSAID therapy. These nephrotoxins will be avoided, and patient managed with iv fluids. Creatinine at baseline.   4.  Hypokalemia: Resolved with oral supplementation. 5. Asthma: Stable/asymptomatic at this time. Will manage with bronchodilators.  6. Pernicious anemia: Patient has a known history of vitamin B12 deficiency, due to intrinsic factor antibody-positve pernicious anemia,and is on 19-monthly B12 injections. HB appears reasonable at this time.  7. LLL CAP with elevated WBC and procalcitonin. No evidence of abscess on CT chest. UA was abnormal, however, UCx was NGTD. MRI of the lumbar spine did not show any osteomyelitis or epidural abscesses. Patient completed 8 days of levofloxacin. DCed 9/11. WBC stable.  8. Hypernatremia: probably secondary to dehydration. Resolved.  9. Tachycardia: possibly from the back pain and benzodiazepine withdrawal.  Improved with IV ativan.  Pain control for now and started patient on prn clonazepam for withdrawal symptoms.   10. Anxiety and benzodiazepine abuse: Rapid response called on 9/6 for tachycardia, spasms, sweating, hypertension, possibly from benzodiazepine withdrawal. Her symptoms improved after a dose of IV ativan of 2 mg. 12 lead EKG showed sinus tachycardia.   She reported hx of anxiety and panic attack disorder and per psychiatry, we discontinued her xanax and put her on clonazepam.  Her tachycardia improved and she received as needed doses of clonazepam to control symptoms.  She may continue this at home.    11. Back spasms: today she is actively having spasms of the back, not improving with robaxin or flexeril.  - We maximized flexeril and robaxin and added lidocaine patch.   12. Constipation  - miralax and bisacodyl  Per conversations with the patient, she would like to stay off of narcotics.  She is not going to be given any narcotics at time of discharge and should discuss this again with her primary care doctor as needed.   Consultants:  [name]. For orthopedics  Nephrology consult over the phone for rhab/ARF.  Neurology consult Procedures:  Mri of the  lumbar spine  CT chest with contrast. Antibiotics:  levaquin 9/4 >> 9/11    Discharge Exam: Filed Vitals:   09/05/12 1009  BP: 138/88  Pulse: 104  Temp:   Resp:    Filed Vitals:   09/05/12 0500 09/05/12 0923 09/05/12 0933 09/05/12 1009  BP: 117/81   138/88  Pulse: 90   104  Temp: 97.6 F (36.4 C)     TempSrc:      Resp: 16     Height:      Weight:      SpO2: 96% 96% 96%     General:  Alert, no acute distress HEENT: MMM  Skin: diaphoretic  Cardiovascular: s1s2, tachycardic, 1/6 vibratory murmur across precordium Respiratory: CTAB  Abdomen: soft NT ND BS+  Back:  Mild tenderness to palpation along the left paraspinal muscles   Discharge Instructions      Discharge Orders    Future Appointments: Provider: Department: Dept Phone: Center:   10/29/2012 10:30 AM Rachael Fee [facility] [phone] None   10/29/2012 11:00 AM Josph Macho, MD [facility] [phone] None   10/29/2012 11:30 AM [facility] [phone] None   03/14/2013 2:15 PM Waymon Budge, MD [facility] [phone] None     Future Orders Please Complete By Expires   Diet - low sodium heart healthy  Increase activity slowly      Discharge instructions      Comments:   You were hospitalized with muscle spasms that led to muscle damage and kidney injury.  You were treated with IV fluids and your muscle damage and kidney injury improved.  The muscle spasms were initially thought to be due to multiple sclerosis, however, MRI of your brain and spinal cord showed no evidence of MS.  Please follow up with your outpatient neurologist for further investigation of MS.  You were started on several muscle relaxants and a lidocaine patch, which helped your symptoms.  You also developed symptoms of benzodiazepine withdrawal and were treated with clonazepam as needed and metoprolol.  Please continue protonix to protect your stomach from acid reflux as well. Stop  taking ibuprofen and naprosyn until you follow up with your primary care doctor as these can affect your kidneys and you recently had kidney injury.  Your kidneys should fully recover.  We also stopped your narcotic medications at your request.  Please follow up with your primary care doctor within 1 week, your orthopedics doctor within 1 month and your neurologist within 1 month.   Call MD for:      Comments:   Please call 911 if you experience chest pain with shortness of breath, or symptoms of stroke, including slurred speech, facial droop, weakness of an arm or leg, or confusion.   Call MD for:  temperature >100.4      Call MD for:  persistant nausea and vomiting      Call MD for:  severe uncontrolled pain      Call MD for:  difficulty breathing, headache or visual disturbances      Call MD for:  hives      Call MD for:  persistant dizziness or light-headedness      Call MD for:  extreme fatigue          Medication List     As of 09/05/2012  2:16 PM    STOP taking these medications         ALPRAZolam 0.5 MG tablet   Commonly known as: XANAX      HYDROcodone-acetaminophen 5-325 MG per tablet   Commonly known as: NORCO/VICODIN      ibuprofen 600 MG tablet   Commonly known as: ADVIL,MOTRIN      naproxen sodium 220 MG tablet   Commonly known as: ANAPROX      TAKE these medications         baclofen 20 MG tablet   Commonly known as: LIORESAL   Take 1 tablet (20 mg total) by mouth 3 (three) times daily.      clonazePAM 0.5 MG tablet   Commonly known as: KLONOPIN   Take 1 tablet (0.5 mg total) by mouth 2 (two) times daily as needed (for anxiety, panic attacks.).      cyanocobalamin 1000 MCG/ML injection   Commonly known as: (VITAMIN B-12)   Inject 1,000 mcg into the muscle every 3 (three) months.      cyclobenzaprine 10 MG tablet   Commonly known as: FLEXERIL   Take 1 tablet (10 mg total) by mouth 3 (three) times daily.      desloratadine 5 MG tablet   Commonly known  as: CLARINEX   Take 1 tablet (5 mg total) by mouth daily.      EPINEPHrine 0.3 mg/0.3 mL Devi   Commonly known as: EPI-PEN   Inject 0.3 mg into the muscle as  needed. For life threatening allergic reaction      escitalopram 20 MG tablet   Commonly known as: LEXAPRO   Take 20 mg by mouth daily.      fluticasone 50 MCG/ACT nasal spray   Commonly known as: FLONASE   Place 2 sprays into the nose daily as needed. For allergy      Fluticasone-Salmeterol 250-50 MCG/DOSE Aepb   Commonly known as: ADVAIR   Inhale 2 puffs into the lungs daily. 1 puff, twice daily- rinse mouth well after use      gabapentin 300 MG capsule   Commonly known as: NEURONTIN   Take 300 mg by mouth 3 (three) times daily.      levocetirizine 5 MG tablet   Commonly known as: XYZAL   Take 1 tablet (5 mg total) by mouth every evening.      lidocaine 5 %   Commonly known as: LIDODERM   Place 1 patch onto the skin daily. Remove & Discard patch within 12 hours or as directed by MD      metoprolol tartrate 25 MG tablet   Commonly known as: LOPRESSOR   Take 2 tablets (50 mg total) by mouth 2 (two) times daily.      montelukast 10 MG tablet   Commonly known as: SINGULAIR   Take 10 mg by mouth at bedtime.      pantoprazole 40 MG tablet   Commonly known as: PROTONIX   Take 1 tablet (40 mg total) by mouth daily at 6 (six) AM.      PROAIR HFA IN   Inhale 2 puffs into the lungs every 4 (four) hours as needed. Shortness of breath or wheezing        Follow-up Information    Follow up with Gwynneth Aliment, MD. Schedule an appointment as soon as possible for a visit in 1 week.   Contact information:   [address] [place] [address] [phone]       Follow up with Eldred Manges, MD. Schedule an appointment as soon as possible for a visit in 1 month.   Contact information:   [facility] [address] [place] [address] [phone]       Follow up with  Neurology. Schedule an appointment as soon as possible for a visit in 1 month.   Contact information:   follow up of chronic muscle spasms and pain and clarification of diagnosis of MS.          The results of significant diagnostics from this hospitalization (including imaging, microbiology, ancillary and laboratory) are listed below for reference.    Significant Diagnostic Studies: Dg Chest 2 View  08/28/2012  *RADIOLOGY REPORT*  Clinical Data: Lakeeta Dobosz of breath.  Leukocytosis.  CHEST - 2 VIEW  Comparison: 05/17/2009  Findings: Cardiac enlargement may be due to projection.  The current study is significantly lordotic in positioning and follow- up study is suggested.  Left lower lobe airspace disease is present.  This may represent atelectasis or pneumonia.  This was not present previously. Retrocardiac gas shadow may represent a lung abscess.  This contains an air-fluid level and  was not present previously.  Negative for pleural effusion.  IMPRESSION: Left lower lobe airspace disease may represent atelectasis or infiltrate.  Pneumonia is possible.  Lordotic positioning on the study.  Follow-up chest x-ray suggested.  Air-fluid level left lower lobe may represent a lung abscess or possibly a hiatal hernia.   Original Report Authenticated By:  Camelia Phenes, M.D.    Ct Chest W Contrast  08/29/2012  *RADIOLOGY REPORT*  Clinical Data: Evaluate for possible lung abscess.  Air fluid level in the left lower lobe.  CT CHEST WITH CONTRAST  Technique:  Multidetector CT imaging of the chest was performed following the standard protocol during bolus administration of intravenous contrast.  Contrast: 80mL OMNIPAQUE IOHEXOL 300 MG/ML  SOLN  Comparison: Chest x-ray 08/28/2012.  Findings:  Mediastinum: Large hiatal hernia which is asymmetrically distributed into the lower left hemithorax, which likely accounts for the perceived air fluid level noted on the recent chest radiograph. Heart size is normal. There is no  significant pericardial fluid, thickening or pericardial calcification. No pathologically enlarged mediastinal or hilar lymph nodes. 8 mm right lobe of thyroid nodule is nonspecific.  Lungs/Pleura: Because of the large hiatal hernia and there is some compressive atelectasis in the left lower lobe.  No left lower lobe abscess identified.  Minimal dependent atelectasis is also noted in the right lower lobe.  There is a small focus of airspace consolidation in the right upper lobe, which is likely infectious. No definite pleural effusions. No definite suspicious appearing pulmonary nodules or masses are identified.  Upper Abdomen: Unremarkable.  Musculoskeletal: There are no aggressive appearing lytic or blastic lesions noted in the visualized portions of the skeleton.  IMPRESSION: 1.  No evidence of pulmonary abscess.  The air-fluid level noted on recent chest radiograph corresponds to a large hiatal hernia. 2.  There is a small focus of airspace consolidation in the right upper lobe which is concerning for acute infection. 3.  Areas of subsegmental atelectasis are noted in the lower lobes of the lungs bilaterally.   Original Report Authenticated By: Florencia Reasons, M.D.    Mr Laqueta Jean Wo Contrast  09/04/2012  *RADIOLOGY REPORT*  Clinical Data:  Low back pain.  Left lower extremity spasms. Progressive inability to ambulate.  Lumbar puncture revealed oligoclonal bands.  Question multiple sclerosis.  MRI HEAD WITHOUT AND WITH CONTRAST MRI CERVICAL SPINE WITHOUT AND WITH CONTRAST  Technique:  Multiplanar, multiecho pulse sequences of the brain and surrounding structures, and cervical spine, to include the craniocervical junction and cervicothoracic junction, were obtained without and with intravenous contrast.  Contrast: 15mL MULTIHANCE GADOBENATE DIMEGLUMINE 529 MG/ML IV SOLN  Comparison:  01/03/2012 head CT.  04/13/2006 MR brain and cervical spine.  MRI HEAD  Findings:  The patient moved between the series.  No  acute infarct.  Similar appearance of punctate/patchy white matter type hyperintensity within the frontal lobes larger on the right measuring up to 5 mm.  New from prior examination (or better visualized) is a 2 x 4 mm white matter hyperintensity right periatrial region (series 6 image 12).  These findings are nonspecific but in a patient of this age, gender and provided history raise possibility of demyelinating process such as multiple sclerosis.  These areas do not demonstrate restricted motion or enhancement as can be seen with areas of active demyelination.  Other causes white matter type changes not entirely excluded include that secondary to; migraine headaches, vasculitis, inflammatory process, prior infection, remote trauma or small vessel disease type changes.  Prominent ossification of the falx. 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  Appearance unchanged.  Major intracranial vascular structures are patent.  Orbital structures appear be grossly within normal limits.  Minimal paranasal sinus mucosal thickening.  No hydrocephalus.  IMPRESSION: Minimal nonspecific white matter type changes as noted above.  MRI CERVICAL  SPINE  Findings: Motion degraded exam.  Decreased signal intensity of bone marrow.  This was noted previously and may be related to the patient's habitus. Correlation with CBC to exclude underlying anemia contributing to this appearance can be performed.  No focal cord signal abnormality or abnormal enhancement.  Scoliosis with superimposed degenerative changes including:  C2-3:  Minimal bulge.  C3-4:  Broad-based disc osteophyte greater to the right.  Mild spinal stenosis.  Moderate right-sided and mild left-sided foraminal narrowing.  C4-5:  Mild bulge.  Mild foraminal narrowing greater on the right.  C5-6:  Right posterior lateral broad-based disc protrusion with right-sided cord compression. Moderate bilateral foramen greater on the right.  C6-7:  Right posterior  lateral focal disc protrusion.  Compression of the right lateral aspect of the cord.  Moderate foraminal narrowing greater on the right.  C7-T1:  Mild bilateral foraminal narrowing greater on the right. Mild bulge.  IMPRESSION: No focal cervical cord signal abnormality.  Scoliosis with cervical spondylotic changes most notable on the right at the C5-6 and C6-7 level as detailed above.  Degenerative changes have progressed slightly since prior exam.  Decreased signal intensity of bone marrow.  Please see above discussion.  MRI THORACIC SPINE WITHOUT AND WITH CONTRAST  Technique:  Multiplanar and multiecho pulse sequences of the thoracic spine were obtained without and with intravenous contrast.  Comparison:  No comparison MR of the thoracic spine.  Comparison chest CT 08/29/2012.  Findings:  Motion degraded exam.  Decreased signal intensity of bone marrow may be related to the patient's habitus.  Correlation with CBC to exclude underlying anemia contributing to this appearance can be performed.  No focal thoracic cord signal abnormality or enhancement.  Scoliosis with scattered mild degenerative changes without significant disc herniation/ thoracic cord compression.  Hiatal hernia.  Dependent atelectasis/small pleural effusions. Parenchymal changes upper lobe.  Please see prior CT report.  IMPRESSION: Motion degraded exam.  No focal thoracic cord signal abnormality or enhancement.  Scoliosis with scattered mild degenerative changes without significant disc herniation/ thoracic cord compression.  Hiatal hernia.  Dependent atelectasis/small pleural effusions. Parenchymal changes upper lobe.  Please see prior CT report.   Original Report Authenticated By: Fuller Canada, M.D.    Mr Lumbar Spine Wo Contrast  08/28/2012  *RADIOLOGY REPORT*  Clinical Data: 46 year old female with severe low back pain radiating down the left lower extremity.  Back spasms.  MRI LUMBAR SPINE WITHOUT CONTRAST  Technique:  Multiplanar and  multiecho pulse sequences of the lumbar spine were obtained without intravenous contrast.  Comparison: 05/04/2012 and earlier.  Findings: Large body habitus.  Stable scoliosis.  Stable lumbar vertebral height and alignment.  Scattered small vertebral body hemangioma is again noted. No marrow edema or evidence of acute osseous abnormality.   Visualized lower thoracic spinal cord is normal with conus medularis at L1.  Stable visualized abdominal viscera.  T9-T10:  Negative.  T10-T11:  Negative.  T11-T12:  Negative.  T12-L1:  Negative.  L1-L2:  Stable mild facet hypertrophy. Negative disc.  No stenosis.  L2-L3:  Stable mild facet hypertrophy.  Negative disc.  No stenosis.  L3-L4:  Stable moderate facet and ligament flavum hypertrophy. Negative disc.  Mild right greater than left L3 foraminal stenosis.  L4-L5:  Chronic disc desiccation and broad-based right lateral recess/foraminal disc protrusion.  Moderate facet and ligament flavum hypertrophy.  Stable mild spinal stenosis, mild right lateral recess stenosis, and moderate right L4 foraminal stenosis.  L5-S1:  Stable disc desiccation.  Moderate sized  broad-based right lateral recess disc protrusion has not significantly changed. Displacement of the descending right S1 nerve root as before.  Mild effacement of the left lateral recess is stable.  No significant spinal stenosis.  Mild right L5 foraminal involvement is stable.  IMPRESSION: 1.  No acute findings in the lumbar spine. 2.  Chronic L4-L5 and L5-S1 disc degeneration and disc herniations affecting the right lateral recess and right neural foramen at each level. 3.  Stable mild L4-L5 spinal stenosis, multifactorial. 4.  Stable lumbar facet hypertrophy.   Original Report Authenticated By: Harley Hallmark, M.D.    Mr Cervical Spine W Wo Contrast  09/04/2012  *RADIOLOGY REPORT*  Clinical Data:  Low back pain.  Left lower extremity spasms. Progressive inability to ambulate.  Lumbar puncture revealed oligoclonal  bands.  Question multiple sclerosis.  MRI HEAD WITHOUT AND WITH CONTRAST MRI CERVICAL SPINE WITHOUT AND WITH CONTRAST  Technique:  Multiplanar, multiecho pulse sequences of the brain and surrounding structures, and cervical spine, to include the craniocervical junction and cervicothoracic junction, were obtained without and with intravenous contrast.  Contrast: 15mL MULTIHANCE GADOBENATE DIMEGLUMINE 529 MG/ML IV SOLN  Comparison:  01/03/2012 head CT.  04/13/2006 MR brain and cervical spine.  MRI HEAD  Findings:  The patient moved between the series.  No acute infarct.  Similar appearance of punctate/patchy white matter type hyperintensity within the frontal lobes larger on the right measuring up to 5 mm.  New from prior examination (or better visualized) is a 2 x 4 mm white matter hyperintensity right periatrial region (series 6 image 12).  These findings are nonspecific but in a patient of this age, gender and provided history raise possibility of demyelinating process such as multiple sclerosis.  These areas do not demonstrate restricted motion or enhancement as can be seen with areas of active demyelination.  Other causes white matter type changes not entirely excluded include that secondary to; migraine headaches, vasculitis, inflammatory process, prior infection, remote trauma or small vessel disease type changes.  Prominent ossification of the falx. 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  Appearance unchanged.  Major intracranial vascular structures are patent.  Orbital structures appear be grossly within normal limits.  Minimal paranasal sinus mucosal thickening.  No hydrocephalus.  IMPRESSION: Minimal nonspecific white matter type changes as noted above.  MRI CERVICAL SPINE  Findings: Motion degraded exam.  Decreased signal intensity of bone marrow.  This was noted previously and may be related to the patient's habitus. Correlation with CBC to exclude underlying anemia contributing  to this appearance can be performed.  No focal cord signal abnormality or abnormal enhancement.  Scoliosis with superimposed degenerative changes including:  C2-3:  Minimal bulge.  C3-4:  Broad-based disc osteophyte greater to the right.  Mild spinal stenosis.  Moderate right-sided and mild left-sided foraminal narrowing.  C4-5:  Mild bulge.  Mild foraminal narrowing greater on the right.  C5-6:  Right posterior lateral broad-based disc protrusion with right-sided cord compression. Moderate bilateral foramen greater on the right.  C6-7:  Right posterior lateral focal disc protrusion.  Compression of the right lateral aspect of the cord.  Moderate foraminal narrowing greater on the right.  C7-T1:  Mild bilateral foraminal narrowing greater on the right. Mild bulge.  IMPRESSION: No focal cervical cord signal abnormality.  Scoliosis with cervical spondylotic changes most notable on the right at the C5-6 and C6-7 level as detailed above.  Degenerative changes have progressed slightly since prior exam.  Decreased signal intensity  of bone marrow.  Please see above discussion.  MRI THORACIC SPINE WITHOUT AND WITH CONTRAST  Technique:  Multiplanar and multiecho pulse sequences of the thoracic spine were obtained without and with intravenous contrast.  Comparison:  No comparison MR of the thoracic spine.  Comparison chest CT 08/29/2012.  Findings:  Motion degraded exam.  Decreased signal intensity of bone marrow may be related to the patient's habitus.  Correlation with CBC to exclude underlying anemia contributing to this appearance can be performed.  No focal thoracic cord signal abnormality or enhancement.  Scoliosis with scattered mild degenerative changes without significant disc herniation/ thoracic cord compression.  Hiatal hernia.  Dependent atelectasis/small pleural effusions. Parenchymal changes upper lobe.  Please see prior CT report.  IMPRESSION: Motion degraded exam.  No focal thoracic cord signal abnormality or  enhancement.  Scoliosis with scattered mild degenerative changes without significant disc herniation/ thoracic cord compression.  Hiatal hernia.  Dependent atelectasis/small pleural effusions. Parenchymal changes upper lobe.  Please see prior CT report.   Original Report Authenticated By: Fuller Canada, M.D.    Mr Thoracic Spine W Wo Contrast  09/04/2012  *RADIOLOGY REPORT*  Clinical Data:  Low back pain.  Left lower extremity spasms. Progressive inability to ambulate.  Lumbar puncture revealed oligoclonal bands.  Question multiple sclerosis.  MRI HEAD WITHOUT AND WITH CONTRAST MRI CERVICAL SPINE WITHOUT AND WITH CONTRAST  Technique:  Multiplanar, multiecho pulse sequences of the brain and surrounding structures, and cervical spine, to include the craniocervical junction and cervicothoracic junction, were obtained without and with intravenous contrast.  Contrast: 15mL MULTIHANCE GADOBENATE DIMEGLUMINE 529 MG/ML IV SOLN  Comparison:  01/03/2012 head CT.  04/13/2006 MR brain and cervical spine.  MRI HEAD  Findings:  The patient moved between the series.  No acute infarct.  Similar appearance of punctate/patchy white matter type hyperintensity within the frontal lobes larger on the right measuring up to 5 mm.  New from prior examination (or better visualized) is a 2 x 4 mm white matter hyperintensity right periatrial region (series 6 image 12).  These findings are nonspecific but in a patient of this age, gender and provided history raise possibility of demyelinating process such as multiple sclerosis.  These areas do not demonstrate restricted motion or enhancement as can be seen with areas of active demyelination.  Other causes white matter type changes not entirely excluded include that secondary to; migraine headaches, vasculitis, inflammatory process, prior infection, remote trauma or small vessel disease type changes.  Prominent ossification of the falx. 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  Appearance unchanged.  Major intracranial vascular structures are patent.  Orbital structures appear be grossly within normal limits.  Minimal paranasal sinus mucosal thickening.  No hydrocephalus.  IMPRESSION: Minimal nonspecific white matter type changes as noted above.  MRI CERVICAL SPINE  Findings: Motion degraded exam.  Decreased signal intensity of bone marrow.  This was noted previously and may be related to the patient's habitus. Correlation with CBC to exclude underlying anemia contributing to this appearance can be performed.  No focal cord signal abnormality or abnormal enhancement.  Scoliosis with superimposed degenerative changes including:  C2-3:  Minimal bulge.  C3-4:  Broad-based disc osteophyte greater to the right.  Mild spinal stenosis.  Moderate right-sided and mild left-sided foraminal narrowing.  C4-5:  Mild bulge.  Mild foraminal narrowing greater on the right.  C5-6:  Right posterior lateral broad-based disc protrusion with right-sided cord compression. Moderate bilateral foramen greater on the right.  C6-7:  Right posterior lateral focal disc protrusion.  Compression of the right lateral aspect of the cord.  Moderate foraminal narrowing greater on the right.  C7-T1:  Mild bilateral foraminal narrowing greater on the right. Mild bulge.  IMPRESSION: No focal cervical cord signal abnormality.  Scoliosis with cervical spondylotic changes most notable on the right at the C5-6 and C6-7 level as detailed above.  Degenerative changes have progressed slightly since prior exam.  Decreased signal intensity of bone marrow.  Please see above discussion.  MRI THORACIC SPINE WITHOUT AND WITH CONTRAST  Technique:  Multiplanar and multiecho pulse sequences of the thoracic spine were obtained without and with intravenous contrast.  Comparison:  No comparison MR of the thoracic spine.  Comparison chest CT 08/29/2012.  Findings:  Motion degraded exam.  Decreased signal intensity of bone marrow may  be related to the patient's habitus.  Correlation with CBC to exclude underlying anemia contributing to this appearance can be performed.  No focal thoracic cord signal abnormality or enhancement.  Scoliosis with scattered mild degenerative changes without significant disc herniation/ thoracic cord compression.  Hiatal hernia.  Dependent atelectasis/small pleural effusions. Parenchymal changes upper lobe.  Please see prior CT report.  IMPRESSION: Motion degraded exam.  No focal thoracic cord signal abnormality or enhancement.  Scoliosis with scattered mild degenerative changes without significant disc herniation/ thoracic cord compression.  Hiatal hernia.  Dependent atelectasis/small pleural effusions. Parenchymal changes upper lobe.  Please see prior CT report.   Original Report Authenticated By: Fuller Canada, M.D.     Microbiology: Recent Results (from the past 240 hour(s))  URINE CULTURE     Status: Normal   Collection Time   08/27/12  2:33 PM      Component Value Range Status Comment   Specimen Description URINE, CATHETERIZED   Final    Special Requests ADDED 08/28/12 2013   Final    Culture  Setup Time 08/28/2012 21:20   Final    Colony Count NO GROWTH   Final    Culture NO GROWTH   Final    Report Status 08/29/2012 FINAL   Final   MRSA PCR SCREENING     Status: Normal   Collection Time   08/30/12  5:22 PM      Component Value Range Status Comment   MRSA by PCR NEGATIVE  NEGATIVE Final      Labs: Basic Metabolic Panel:  Lab 09/01/12 9562 08/30/12 1743 08/30/12 0620  NA 138 -- 141  K 4.0 3.6 2.8*  CL 106 -- 108  CO2 24 -- 26  GLUCOSE 101* -- 149*  BUN 7 -- 4*  CREATININE 0.73 -- 0.86  CALCIUM 9.4 -- 8.9  MG -- 1.7 --  PHOS -- -- --   Liver Function Tests: No results found for this basename: AST:5,ALT:5,ALKPHOS:5,BILITOT:5,PROT:5,ALBUMIN:5 in the last 168 hours No results found for this basename: LIPASE:5,AMYLASE:5 in the last 168 hours No results found for this basename:  AMMONIA:5 in the last 168 hours CBC:  Lab 09/01/12 1104 08/30/12 0620  WBC 10.4 8.0  NEUTROABS -- --  HGB 10.3* 10.0*  HCT 31.1* 29.2*  MCV 84.1 82.7  PLT 210 179   Cardiac Enzymes:  Lab 09/02/12 1312 08/30/12 1743  CKTOTAL 4922* 12551*  CKMB -- --  CKMBINDEX -- --  TROPONINI -- --   BNP: BNP (last 3 results) No results found for this basename: PROBNP:3 in the last 8760 hours CBG:  Lab 09/05/12 1134 09/05/12 0732 09/05/12 0453 09/04/12  2350 09/04/12 2030  GLUCAP 96 120* 110* 128* 111*    Time coordinating discharge: 45 minutes  Signed:  Reyah Streeter  [facility] 09/05/2012, 2:16 PM

## 2012-09-05 NOTE — Progress Notes (Signed)
Physical Therapy Treatment Patient Details Name: Sherri [name] MRN: [id number] DOB: September 24, 1966 Today's Date: 09/05/2012 Time: 4098-1191 PT Time Calculation (min): 18 min  PT Assessment / Plan / Recommendation Comments on Treatment Session  pt presents with back pain and Bil LE muscle spasms.  pt moving much better today.      Follow Up Recommendations  Home health PT;Supervision/Assistance - 24 hour    Barriers to Discharge        Equipment Recommendations  3 in 1 bedside comode;Rolling walker with 5" wheels    Recommendations for Other Services    Frequency Min 4X/week   Plan Discharge plan needs to be updated;Frequency remains appropriate    Precautions / Restrictions Precautions Precautions: Fall Restrictions Weight Bearing Restrictions: No   Pertinent Vitals/Pain Denies pain or muscle spasms today.      Mobility  Bed Mobility Bed Mobility: Not assessed Rolling Left: 4: Min guard;With rail Left Sidelying to Sit: 4: Min guard;With rails;HOB elevated Supine to Sit: 4: Min guard;HOB elevated Details for Bed Mobility Assistance: insist on HOB elevated and using raisl Transfers Transfers: Stand to Sit Sit to Stand: 4: Min guard;Without upper extremity assist;From bed Stand to Sit: 4: Min guard;With upper extremity assist;To chair/3-in-1;With armrests Details for Transfer Assistance: pt standing with OT on arrival.  pt needs safety cueing for use of UEs and to control descent to chair.   Ambulation/Gait Ambulation/Gait Assistance: 4: Min guard Ambulation Distance (Feet): 80 Feet Assistive device: Rolling walker Ambulation/Gait Assistance Details: pt moves slowly with very rigid posture.  pt indicates no muslce spasms today.   Gait Pattern: Step-through pattern;Decreased stride length;Decreased hip/knee flexion - right;Decreased hip/knee flexion - left Stairs: No Wheelchair Mobility Wheelchair Mobility: No    Exercises     PT Diagnosis:    PT Problem List:   PT  Treatment Interventions:     PT Goals Acute Rehab PT Goals Time For Goal Achievement: 09/13/12 PT Goal: Stand to Sit - Progress: Progressing toward goal PT Goal: Ambulate - Progress: Progressing toward goal  Visit Information  Last PT Received On: 09/05/12 Assistance Needed: +1 PT/OT Co-Evaluation/Treatment: Yes    Subjective Data  Subjective: pt reports doing much better today.     Cognition  Overall Cognitive Status: Appears within functional limits for tasks assessed/performed Arousal/Alertness: Awake/alert Orientation Level: Appears intact for tasks assessed Behavior During Session: [facility] for tasks performed Cognition - Other Comments: Pt progresses in session by allowing patient to direct care and sequence in session.    Balance  Balance Balance Assessed: Yes Static Standing Balance Static Standing - Balance Support: No upper extremity supported;During functional activity Static Standing - Level of Assistance: 5: Stand by assistance Static Standing - Comment/# of Minutes: Stood at sink for oral hygiene Dynamic Standing Balance Dynamic Standing - Balance Support: No upper extremity supported Dynamic Standing - Level of Assistance: 5: Stand by assistance Dynamic Standing - Balance Activities: Other (comment) (using bil ue to open all adl items)  End of Session PT - End of Session Equipment Utilized During Treatment: Gait belt Activity Tolerance: Patient tolerated treatment well Patient left: in chair;with call bell/phone within reach Nurse Communication: Mobility status   GP     Sunny Schlein, [place] [phone] 09/05/2012, 1:39 PM

## 2012-09-05 NOTE — Progress Notes (Signed)
Occupational Therapy Treatment Patient Details Name: Sherri [name] MRN: [id number] DOB: 07-15-1966 Today's Date: 09/05/2012 Time: 4098-1191 OT Time Calculation (min): 21 min  OT Assessment / Plan / Recommendation Comments on Treatment Session Pt is a adequate level for d/c home and requesting to d/c today. Pt completed 3n1 transfer, peri hygiene , sink level grooming and ambuation without rest breaks this session. Ot will d/c from caseload at this time. No further acute needs    Follow Up Recommendations  Home health OT    Barriers to Discharge       Equipment Recommendations  3 in 1 bedside comode;Rolling walker with 5" wheels    Recommendations for Other Services    Frequency Min 2X/week   Plan Discharge plan needs to be updated    Precautions / Restrictions Precautions Precautions: Fall Restrictions Weight Bearing Restrictions: No   Pertinent Vitals/Pain None     ADL  Grooming: Performed;Teeth care;Modified independent Where Assessed - Grooming: Unsupported standing (sink level) Lower Body Bathing: Performed;Supervision/safety Where Assessed - Lower Body Bathing: Unsupported standing Lower Body Dressing: Performed;Minimal assistance Where Assessed - Lower Body Dressing: Unsupported standing Toilet Transfer: Performed;Min guard Toilet Transfer Method: Sit to stand Toilet Transfer Equipment: Raised toilet seat with arms (or 3-in-1 over toilet) Toileting - Clothing Manipulation and Hygiene: Performed;Min guard Where Assessed - Toileting Clothing Manipulation and Hygiene: Sit to stand from 3-in-1 or toilet Equipment Used: Gait belt;Rolling walker Transfers/Ambulation Related to ADLs: Pt ambulated >1107ft without stopping after toileting , hygiene and oral care at sink. Pt demonstrates increased activity tolerance and no report of pain. Pt was concerned if one medication was given but was uncertain which medication she wanted to ask RN about yet. ADL Comments: Pt  progressing well this session. Pt insisted on HOB remaining 45 degrees for bed mobility. Pt exiting bed on Lt side. Pt sitting Min guard at EOB. Pt reports all spasms have stopped. "i am not having spasms now" Pt requesting 3n1 and no signs of incontinence noted during mobility. Pt is at adequate level for d/c home with family assistance. Pt reports "i am going home today"      OT Goals Acute Rehab OT Goals OT Goal Formulation: With patient Time For Goal Achievement: 09/17/12 Potential to Achieve Goals: Good ADL Goals Pt Will Perform Grooming: with min assist;Standing at sink ADL Goal: Grooming - Progress: Met Pt Will Perform Upper Body Bathing: with min assist;Supported;Standing at sink ADL Goal: Upper Body Bathing - Progress: Met Pt Will Perform Lower Body Bathing: with mod assist;Sitting at sink ADL Goal: Lower Body Bathing - Progress: Met Pt Will Perform Upper Body Dressing: with min assist;Supported;Sitting, chair ADL Goal: Upper Body Dressing - Progress: Met Pt Will Perform Lower Body Dressing: with mod assist;Sitting, chair;Supported Engineer, water to Toilet: with min assist;Ambulation;3-in-1 ADL Goal: Statistician - Progress: Met Pt Will Perform Toileting - Clothing Manipulation: with min assist;Sitting on 3-in-1 or toilet ADL Goal: Toileting - Clothing Manipulation - Progress: Met Pt Will Perform Toileting - Hygiene: with min assist;Sit to stand from 3-in-1/toilet ADL Goal: Toileting - Hygiene - Progress: Met Miscellaneous OT Goals Miscellaneous OT Goal #1: Pt will complete bed mobility MOd I as precursor to adls HOB 20 degrees of less OT Goal: Miscellaneous Goal #1 - Progress: Partly met  Visit Information  Last OT Received On: 09/05/12 Assistance Needed: +1 PT/OT Co-Evaluation/Treatment: Yes    Subjective Data      Prior Functioning       Cognition  Overall Cognitive  Status: Appears within functional limits for tasks assessed/performed Arousal/Alertness:  Awake/alert Orientation Level: Appears intact for tasks assessed Behavior During Session: [facility] for tasks performed Cognition - Other Comments: Pt progresses in session by allowing patient to direct care and sequence in session.    Mobility  Shoulder Instructions Bed Mobility Rolling Left: 4: Min guard;With rail Left Sidelying to Sit: 4: Min guard;With rails;HOB elevated Supine to Sit: 4: Min guard;HOB elevated Details for Bed Mobility Assistance: insist on HOB elevated and using raisl Transfers Sit to Stand: 4: Min guard;Without upper extremity assist;From bed Stand to Sit: Without upper extremity assist;To chair/3-in-1;4: Min guard Details for Transfer Assistance: pt reaching for environmental supports and increased confidence with mobility.        Exercises      Balance Dynamic Standing Balance Dynamic Standing - Balance Support: No upper extremity supported Dynamic Standing - Level of Assistance: 5: Stand by assistance Dynamic Standing - Balance Activities: Other (comment) (using bil ue to open all adl items)   End of Session OT - End of Session Activity Tolerance: Patient tolerated treatment well Patient left: in chair;with call bell/phone within reach Nurse Communication: Mobility status  GO     Harrel Carina [facility] 09/05/2012, 1:03 PM Pager: [phone]

## 2012-09-08 LAB — GLUTAMIC ACID DECARBOXYLASE AUTO ABS: Glutamic Acid Decarb Ab: >30 U/mL — ABNORMAL HIGH (ref <=1.0)

## 2012-10-29 ENCOUNTER — Ambulatory Visit: Payer: Self-pay

## 2012-10-29 ENCOUNTER — Ambulatory Visit (HOSPITAL_BASED_OUTPATIENT_CLINIC_OR_DEPARTMENT_OTHER): Payer: Federal, State, Local not specified - PPO | Admitting: Medical

## 2012-10-29 ENCOUNTER — Other Ambulatory Visit: Payer: Self-pay | Admitting: Lab

## 2012-10-29 ENCOUNTER — Ambulatory Visit (HOSPITAL_BASED_OUTPATIENT_CLINIC_OR_DEPARTMENT_OTHER): Payer: Federal, State, Local not specified - PPO

## 2012-10-29 ENCOUNTER — Other Ambulatory Visit (HOSPITAL_BASED_OUTPATIENT_CLINIC_OR_DEPARTMENT_OTHER): Payer: Federal, State, Local not specified - PPO | Admitting: Lab

## 2012-10-29 ENCOUNTER — Ambulatory Visit: Payer: Self-pay | Admitting: Hematology & Oncology

## 2012-10-29 VITALS — BP 117/80 | HR 93 | Temp 97.6°F | Resp 16 | Ht 62.0 in | Wt 180.0 lb

## 2012-10-29 DIAGNOSIS — D510 Vitamin B12 deficiency anemia due to intrinsic factor deficiency: Secondary | ICD-10-CM

## 2012-10-29 DIAGNOSIS — D509 Iron deficiency anemia, unspecified: Secondary | ICD-10-CM

## 2012-10-29 DIAGNOSIS — G2582 Stiff-man syndrome: Secondary | ICD-10-CM

## 2012-10-29 DIAGNOSIS — Z86718 Personal history of other venous thrombosis and embolism: Secondary | ICD-10-CM

## 2012-10-29 LAB — CBC WITH DIFFERENTIAL (CANCER CENTER ONLY)
BASO#: 0 10*3/uL (ref 0.0–0.2)
BASO%: 0 % (ref 0.0–2.0)
EOS%: 0 % (ref 0.0–7.0)
Eosinophils Absolute: 0 10*3/uL (ref 0.0–0.5)
HGB: 11.7 g/dL (ref 11.6–15.9)
LYMPH#: 1.7 10*3/uL (ref 0.9–3.3)
MCHC: 33.3 g/dL (ref 32.0–36.0)
MCV: 84 fL (ref 81–101)
NEUT#: 2.8 10*3/uL (ref 1.5–6.5)
Platelets: 254 10*3/uL (ref 145–400)
RBC: 4.18 10*6/uL (ref 3.70–5.32)
RDW: 12.3 % (ref 11.1–15.7)

## 2012-10-29 LAB — FERRITIN: Ferritin: 106 ng/mL (ref 10–291)

## 2012-10-29 LAB — VITAMIN B12: Vitamin B-12: 403 pg/mL (ref 211–911)

## 2012-10-29 LAB — IRON AND TIBC
Iron: 53 ug/dL (ref 42–145)
TIBC: 352 ug/dL (ref 250–470)
UIBC: 299 ug/dL (ref 125–400)

## 2012-10-29 MED ORDER — CYANOCOBALAMIN 1000 MCG/ML IJ SOLN
1000.0000 ug | Freq: Once | INTRAMUSCULAR | Status: AC
  Administered 2012-10-29: 1000 ug via INTRAMUSCULAR

## 2012-10-29 NOTE — Progress Notes (Signed)
Diagnoses: #1 pernicious anemia-anti intrinsic factor antibodies. #2 intermittent iron deficiency anemia. #3 severe low back pain, secondary to disc herniation. 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  Current therapy: Vitamin B12 1 mg IM every 3 months.  Interim history: Sherri [name] presents today for an office followup visit.  Since we saw her last, she states that she did have a hospital admission for back spasms.  Apparently, she has been waiting on back surgery for herniated disc.  She ended up in the hospital secondary to back spasms, as well as severe, dehydration, and rhabdomyolysis.  She also had acute renal failure.  With aggressive fluid resuscitation.  Her rhabdomyolysis and acute renal failure and dehydration, did resolve.  She was given a course of physical therapy and continued to make improvements throughout her hospital stay.  She was then discharged after 12 days.  She reports, that she's been waiting on call from Dr. Ophelia Charter office regarding her back surgery.  [name] also states, that she was informed by her neurologist, Dr. 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  She has had a workup in the past for MS, which was negative.   She otherwise reports, that she's not having any headaches, any blurred, vision.  She's not having any mental status, changes.  She's not having any nausea, vomiting, diarrhea, constipation.  She denies any obvious, bleeding.  She denies any upper or lower extremity he swelling.  She has a good appetite.  She has a decent energy level.  She continues to receive vitamin B12 every 3 months.  Her last vitamin B12 back in August of 2013 was 437.  Her iron level was 81, with 24% saturation.  Review of Systems: Constitutional:Negative for malaise/fatigue, fever, chills, weight loss, diaphoresis, activity change, appetite change, and unexpected  weight change.  HEENT: Negative for double vision, blurred vision, visual loss, ear pain, tinnitus, congestion, rhinorrhea, epistaxis sore throat or sinus disease, oral pain/lesion, tongue soreness Respiratory: Negative for cough, chest tightness, shortness of breath, wheezing and stridor.  Cardiovascular: Negative for chest pain, palpitations, leg swelling, orthopnea, PND, DOE or claudication Gastrointestinal: Negative for nausea, vomiting, abdominal pain, diarrhea, constipation, blood in stool, melena, hematochezia, abdominal distention, anal bleeding, rectal pain, anorexia and hematemesis.  Genitourinary: Negative for dysuria, frequency, hematuria,  Musculoskeletal: Negative for myalgias, back pain, joint swelling, arthralgias and gait problem. +for back spasms secondary to herniated disc. Skin: Negative for rash, color change, pallor and wound.  Neurological:. Negative for dizziness/light-headedness, tremors, seizures, syncope, facial asymmetry, speech difficulty, weakness, numbness, headaches and paresthesias.  Hematological: Negative for adenopathy. Does not bruise/bleed easily.  Psychiatric/Behavioral:  Negative for depression, no loss of interest in normal activity or change in sleep pattern.   Physical Exam: This is a 46 year old, well-developed, well-nourished, African American, female, in no obvious distress Vitals: Temperature 97.6 degrees, pulse 93, respirations 16, blood pressure 117/80.  Weight 180 pounds HEENT reveals a normocephalic, atraumatic skull, no scleral icterus, no oral lesions  Neck is supple without any cervical or supraclavicular adenopathy.  Lungs are clear to auscultation bilaterally. There are no wheezes, rales or rhonci Cardiac is regular rate and rhythm with a normal S1 and S2. There are no murmurs, rubs, or bruits.  Abdomen is soft with good bowel sounds there's a palpable mass. There is no palpable hepatosplenomegaly. There is no palpable fluid wave.    Musculoskeletal  no tenderness of the spine, ribs, or hips.  Extremities there are no clubbing, cyanosis, or edema.  Skin no petechia, purpura or ecchymosis Neurologic is nonfocal.  Laboratory Data: White count 4.8, hemoglobin 11.7, hematocrit 35.1, platelets 254,000   Current Outpatient Prescriptions on File Prior to Visit  Medication Sig Dispense Refill  . Albuterol Sulfate (PROAIR HFA IN) Inhale 2 puffs into the lungs every 4 (four) hours as needed. Shortness of breath or wheezing      . clonazePAM (KLONOPIN) 0.5 MG tablet Take 1 tablet (0.5 mg total) by mouth 2 (two) times daily as needed (for anxiety, panic attacks.).  40 tablet  0  . cyanocobalamin (,VITAMIN B-12,) 1000 MCG/ML injection Inject 1,000 mcg into the muscle every 3 (three) months.       . desloratadine (CLARINEX) 5 MG tablet Take 1 tablet (5 mg total) by mouth daily.  30 tablet  0  . EPINEPHrine (EPI-PEN) 0.3 mg/0.3 mL DEVI Inject 0.3 mg into the muscle as needed. For life threatening allergic reaction      . escitalopram (LEXAPRO) 20 MG tablet Take 20 mg by mouth daily.        . fluticasone (FLONASE) 50 MCG/ACT nasal spray Place 2 sprays into the nose daily as needed. For allergy      . Fluticasone-Salmeterol (ADVAIR) 250-50 MCG/DOSE AEPB Inhale 2 puffs into the lungs daily. 1 puff, twice daily- rinse mouth well after use      . gabapentin (NEURONTIN) 300 MG capsule Take 300 mg by mouth 3 (three) times daily.       [name] levocetirizine (XYZAL) 5 MG tablet Take 1 tablet (5 mg total) by mouth every evening.  30 tablet  0  . metoprolol tartrate (LOPRESSOR) 25 MG tablet Take 2 tablets (50 mg total) by mouth 2 (two) times daily.  60 tablet  1  . montelukast (SINGULAIR) 10 MG tablet Take 10 mg by mouth at bedtime.        . pantoprazole (PROTONIX) 40 MG tablet Take 1 tablet (40 mg total) by mouth daily at 6 (six) AM.  30 tablet  1   Assessment/Plan: This is a 46 year old, female, with the following issues:  #1 pernicious  anemia-she does have a history of iron deficiency in the past.  She is doing well on the vitamin B12 injection every 3 months, and we will continue with this plan.  #2 " stiff person's syndrome"  this apparently is being followed by her neurologist.  #3.  Herniated disc.  She follows up with Dr. Ophelia Charter regarding this issue.  #4 followup-we will follow back up with Ms. Tritz in 3 months, but before then should there be questions or concerns.

## 2012-11-14 ENCOUNTER — Telehealth: Payer: Self-pay | Admitting: Internal Medicine

## 2012-11-14 NOTE — Telephone Encounter (Signed)
Pt is asking for OV note to be sent to her from 03-14-12. She had part of the note because she read it verbatim to me but she only had the first page and she states she needs the whole note for workers comp. Note faxed to pt at [phone]. Carron Curie, CMA

## 2012-11-25 ENCOUNTER — Telehealth: Payer: Self-pay | Admitting: Internal Medicine

## 2012-11-25 NOTE — Telephone Encounter (Signed)
Pt states she needs a revised note from the one done back on March. She states the date needs to be changed from March 01, 2012 to March 11, 2012, this is when she started in the new area. The are that needs to be revised is as follows:   "She was most recently seen on March 14, 2012, at which time she reported she had been reassigned to a work area where this exposure resumed.  I believe that was around March 01, 2012."  Pt states everything else with the letter can stay the same but they are requesting this daet be changed. Pt will come by and pick up letter once ready. Please advise. Carron Curie, CMA

## 2012-11-26 ENCOUNTER — Telehealth: Payer: Self-pay | Admitting: Internal Medicine

## 2012-11-26 NOTE — Telephone Encounter (Signed)
Pt calling back to let CY know that she also needs another copy of her OV note from 03/14/12. The first copy did not come through.

## 2012-11-26 NOTE — Telephone Encounter (Signed)
See phone note from 11/26/12 

## 2012-12-03 ENCOUNTER — Encounter: Payer: Self-pay | Admitting: *Deleted

## 2012-12-03 NOTE — Telephone Encounter (Signed)
Pt came by office and we fixed letter to state the 18 of March not 8th of March.

## 2013-01-24 ENCOUNTER — Telehealth: Payer: Self-pay | Admitting: Internal Medicine

## 2013-01-24 NOTE — Telephone Encounter (Signed)
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  They only need the dates that the pt has been out of work . Pt stated to let her know if CY can do this letter and let her know before we fax it to her home number.  CY  Please advise. Thanks  Last ov--03/14/2012 Next ov--03/14/2013

## 2013-01-24 NOTE — Telephone Encounter (Signed)
Please ask her what dates she wants used for out of work.

## 2013-01-27 NOTE — Telephone Encounter (Signed)
Pt returned call & states the dates are 08/13/2009-05/26/2010 & 05/26/2010-09/05/2010.  Pt states again that this must not include anything medical.  Antionette Fairy

## 2013-01-27 NOTE — Telephone Encounter (Signed)
lmtcb--per dr young need dates for letter

## 2013-01-27 NOTE — Telephone Encounter (Signed)
I spoke with pt and confirmed dates below. Please advise Dr. Maple Hudson thanks

## 2013-01-28 ENCOUNTER — Encounter: Payer: Self-pay | Admitting: *Deleted

## 2013-01-28 NOTE — Telephone Encounter (Signed)
Pt aware that letter is ready and requests we fax to her home number at [phone]. Nothing further needed at this time.

## 2013-01-28 NOTE — Telephone Encounter (Signed)
Please generate a letter to her stating that     according to our information, she was out of work from 08/13/09 until 05/26/10 and from 05/26/10 until 09/05/10.  Do not include any medical information or diagnoses, per patient request. She asks we let her know before faxing it to her home.  It looks as if she is asking this letter be dated 10/13/10 ????

## 2013-01-28 NOTE — Telephone Encounter (Signed)
Patient calling back checking on the status of letter.  Please return call.

## 2013-01-28 NOTE — Telephone Encounter (Signed)
Pt called back re: same. Sherri [name]

## 2013-01-29 ENCOUNTER — Other Ambulatory Visit (HOSPITAL_BASED_OUTPATIENT_CLINIC_OR_DEPARTMENT_OTHER): Payer: Federal, State, Local not specified - PPO | Admitting: Lab

## 2013-01-29 ENCOUNTER — Ambulatory Visit (HOSPITAL_BASED_OUTPATIENT_CLINIC_OR_DEPARTMENT_OTHER): Payer: Federal, State, Local not specified - PPO

## 2013-01-29 ENCOUNTER — Ambulatory Visit (HOSPITAL_BASED_OUTPATIENT_CLINIC_OR_DEPARTMENT_OTHER): Payer: Federal, State, Local not specified - PPO | Admitting: Hematology & Oncology

## 2013-01-29 VITALS — BP 119/83 | HR 76 | Temp 97.9°F | Resp 16 | Ht 62.0 in | Wt 183.0 lb

## 2013-01-29 DIAGNOSIS — Z86718 Personal history of other venous thrombosis and embolism: Secondary | ICD-10-CM

## 2013-01-29 DIAGNOSIS — G2582 Stiff-man syndrome: Secondary | ICD-10-CM

## 2013-01-29 DIAGNOSIS — D510 Vitamin B12 deficiency anemia due to intrinsic factor deficiency: Secondary | ICD-10-CM

## 2013-01-29 DIAGNOSIS — D509 Iron deficiency anemia, unspecified: Secondary | ICD-10-CM

## 2013-01-29 LAB — CBC WITH DIFFERENTIAL (CANCER CENTER ONLY)
BASO#: 0 10*3/uL (ref 0.0–0.2)
Eosinophils Absolute: 0 10*3/uL (ref 0.0–0.5)
HGB: 11.2 g/dL — ABNORMAL LOW (ref 11.6–15.9)
LYMPH%: 22.9 % (ref 14.0–48.0)
MCV: 85 fL (ref 81–101)
MONO#: 0.5 10*3/uL (ref 0.1–0.9)
NEUT#: 5.8 10*3/uL (ref 1.5–6.5)
Platelets: 285 10*3/uL (ref 145–400)
RBC: 4.13 10*6/uL (ref 3.70–5.32)
RDW: 12.9 % (ref 11.1–15.7)
WBC: 8.1 10*3/uL (ref 3.9–10.0)

## 2013-01-29 MED ORDER — SODIUM CHLORIDE 0.9 % IV SOLN
1020.0000 mg | Freq: Once | INTRAVENOUS | Status: AC
  Administered 2013-01-29: 1020 mg via INTRAVENOUS
  Filled 2013-01-29: qty 34

## 2013-01-29 MED ORDER — CYANOCOBALAMIN 1000 MCG/ML IJ SOLN
1000.0000 ug | Freq: Once | INTRAMUSCULAR | Status: AC
  Administered 2013-01-29: 1000 ug via INTRAMUSCULAR

## 2013-01-29 MED ORDER — SODIUM CHLORIDE 0.9 % IV SOLN
INTRAVENOUS | Status: DC
  Administered 2013-01-29: 12:00:00 via INTRAVENOUS

## 2013-01-29 NOTE — Patient Instructions (Signed)
Ferumoxytol injection What is this medicine? FERUMOXYTOL is an iron complex. Iron is used to make healthy red blood cells, which carry oxygen and nutrients throughout the body. This medicine is used to treat iron deficiency anemia in people with chronic kidney disease. This medicine may be used for other purposes; ask your health care provider or pharmacist if you have questions. What should I tell my health care provider before I take this medicine? They need to know if you have any of these conditions: -anemia not caused by low iron levels -high levels of iron in the blood -magnetic resonance imaging (MRI) test scheduled -an unusual or allergic reaction to iron, other medicines, foods, dyes, or preservatives -pregnant or trying to get pregnant -breast-feeding How should I use this medicine? This medicine is for infusion into a vein. It is given by a health care professional in a hospital or clinic setting. Talk to your pediatrician regarding the use of this medicine in children. Special care may be needed. Overdosage: If you think you've taken too much of this medicine contact a poison control center or emergency room at once. Overdosage: If you think you have taken too much of this medicine contact a poison control center or emergency room at once. NOTE: This medicine is only for you. Do not share this medicine with others. What if I miss a dose? It is important not to miss your dose. Call your doctor or health care professional if you are unable to keep an appointment. What may interact with this medicine? This medicine may interact with the following medications: -other iron products This list may not describe all possible interactions. Give your health care provider a list of all the medicines, herbs, non-prescription drugs, or dietary supplements you use. Also tell them if you smoke, drink alcohol, or use illegal drugs. Some items may interact with your medicine. What should I watch  for while using this medicine? Visit your doctor or healthcare professional regularly. Tell your doctor or healthcare professional if your symptoms do not start to get better or if they get worse. You may need blood work done while you are taking this medicine. You may need to follow a special diet. Talk to your doctor. Foods that contain iron include: whole grains/cereals, dried fruits, beans, or peas, leafy green vegetables, and organ meats (liver, kidney). What side effects may I notice from receiving this medicine? Side effects that you should report to your doctor or health care professional as soon as possible: -allergic reactions like skin rash, itching or hives, swelling of the face, lips, or tongue -breathing problems -changes in blood pressure -feeling faint or lightheaded, falls -fever or chills -flushing, sweating, or hot feelings -swelling of the ankles or feet Side effects that usually do not require medical attention (Report these to your doctor or health care professional if they continue or are bothersome.): -diarrhea -headache -nausea, vomiting -stomach pain This list may not describe all possible side effects. Call your doctor for medical advice about side effects. You may report side effects to FDA at 1-800-FDA-1088. Where should I keep my medicine? This drug is given in a hospital or clinic and will not be stored at home. NOTE: This sheet is a summary. It may not cover all possible information. If you have questions about this medicine, talk to your doctor, pharmacist, or health care provider.  2012, [employer]vier/Gold Standard. (09/02/2008 9:48:25 PM) 

## 2013-01-29 NOTE — Progress Notes (Signed)
This office note has been dictated.

## 2013-01-30 LAB — IRON AND TIBC
%SAT: 12 % — ABNORMAL LOW (ref 20–55)
Iron: 38 ug/dL — ABNORMAL LOW (ref 42–145)
UIBC: 275 ug/dL (ref 125–400)

## 2013-01-30 LAB — ERYTHROPOIETIN: Erythropoietin: 25.1 m[IU]/mL — ABNORMAL HIGH (ref 2.6–18.5)

## 2013-01-30 LAB — VITAMIN B12: Vitamin B-12: 472 pg/mL (ref 211–911)

## 2013-01-30 NOTE — Progress Notes (Signed)
CC:   Robyn N. Allyne Gee, M.D. Veverly Fells. Ophelia Charter, M.D. Marlan Palau, M.D.  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 3. Low back pain secondary to stiff man syndrome/herniated disk. 4. Intermittent iron-deficiency anemia. 5. 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  CURRENT THERAPY: 1. Vitamin B12 1 mg IM every 3 months. 2. Intermittent iron infusions.  INTERIM HISTORY:  Ms. Fern comes in for followup.  She now has been diagnosed with stiff man syndrome.  She was hospitalized for this.  This certainly is unusual.  This is very rare.  I think she is on different medications for this.  She still gets some spasm on occasion.  She last got her B12 3 months ago.  Her last iron studies showed an iron saturation of 15% with a ferritin of 106.  I really think that she is going to need some IV iron.  We will see about giving this today.  She has had no change in bowel or bladder habits.  She has had no leg swelling.  Again, she gets these spasms from the stiff man syndrome.  PHYSICAL EXAMINATION:  General:  This is a well-developed, well- nourished black female in no obvious distress.  Vital signs: Temperature of 97.9, pulse 76, respiratory rate 16, blood pressure 119/83.  Weight is 183.  Head and neck:  Normocephalic, atraumatic skull.  There are no ocular or oral lesions.  There are no palpable cervical or supraclavicular lymph nodes.  Lungs:  Clear bilaterally. Cardiac:  Regular rate and rhythm with a normal S1 and S2.  There are no murmurs, rubs, or bruits.  Abdomen:  Soft with good bowel sounds.  There is no palpable abdominal mass.  No palpable hepatosplenomegaly.  Back: No tenderness over the spine, ribs, or hips.  Extremities:  No clubbing, cyanosis, or edema.  Skin:  No rash, ecchymosis, or petechia.  LABORATORY STUDIES:  Show white cell count of 8.1, hemoglobin 11.2, hematocrit 35.1, platelet count 285.  IMPRESSION:   I think Ms. Korf is a very nice 47 year old African American female.  She has multiple issues.  We will go ahead and give her B12 today.  I will also give her iron today.  I think this will help her out.  I think this will help with her anemia.  We will plan to get her back in 3 more months.  We will certainly pray hard for her.    ______________________________ Josph Macho, M.D. PRE/MEDQ  D:  01/29/2013  T:  01/30/2013  Job:  [id number]

## 2013-03-14 ENCOUNTER — Encounter: Payer: Self-pay | Admitting: Internal Medicine

## 2013-03-14 ENCOUNTER — Ambulatory Visit (INDEPENDENT_AMBULATORY_CARE_PROVIDER_SITE_OTHER): Admitting: Internal Medicine

## 2013-03-14 VITALS — BP 120/76 | HR 65 | Ht 62.25 in | Wt 178.8 lb

## 2013-03-14 DIAGNOSIS — L509 Urticaria, unspecified: Secondary | ICD-10-CM

## 2013-03-14 DIAGNOSIS — J189 Pneumonia, unspecified organism: Secondary | ICD-10-CM

## 2013-03-14 DIAGNOSIS — K449 Diaphragmatic hernia without obstruction or gangrene: Secondary | ICD-10-CM

## 2013-03-14 DIAGNOSIS — M549 Dorsalgia, unspecified: Secondary | ICD-10-CM

## 2013-03-14 DIAGNOSIS — G8929 Other chronic pain: Secondary | ICD-10-CM

## 2013-03-14 NOTE — Progress Notes (Signed)
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 To avoid exposure to paper dust and related products, she was working in  office. 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 She admits she was very anxious walking in on the first day and that some symptoms might just be from nervousness. She felt some itching, wheeze and cough. She felt hoarse which worried her because she needs to sing in church. Sinuses are draining. She did have an upper respiratory infection this winter. Has never had seasonal pollen rhinitis before. Medications reviewed-Flonase, Advair 250, Xyzal, Singulair, pro air, EpiPen (never needed).  03/14/13- 49 yoF never smoker followed for asthma complicated by hx of DVT, urticaria/ angioedema, DGD FOLLOWS FOR: slight wheezing at times(not bad-happens when she eats and then lays down). 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 Now diagnosed with "stiff person syndrome" for which her activity is restricted by Dr. Willis/neurology. Lying down with. She notices some wheeze or cough suggesting reflux but does not feel heartburn and does not wake up choking. Sleeps elevated on 4 pillows. Has been working with psychiatrist. Does not describe urticaria now. CT chest 08/29/12 IMPRESSION:  1. No evidence of pulmonary abscess. The air-fluid level noted on  recent chest radiograph corresponds to a large hiatal hernia.  2. There is a small focus of airspace consolidation in the right  upper lobe which is concerning for acute infection.  3. Areas of subsegmental atelectasis are noted in the lower lobes  of the lungs bilaterally.  Original Report Authenticated By: Florencia Reasons, M.D.   ROS-see HPI Constitutional:   No-   weight loss, night sweats, fevers, chills, fatigue, lassitude. HEENT:   No-  headaches,  difficulty swallowing, tooth/dental problems, sore throat,       No-  sneezing, itching, ear ache, nasal congestion,+ post nasal drip,  CV:  No-   chest pain, orthopnea, PND, swelling in lower extremities, anasarca, dizziness, palpitations Resp: No-   shortness of breath with exertion or at rest.              No-   productive cough,  +non-productive cough,  No- coughing up of blood.              No-   change in color of mucus.  + wheezing.   Skin: No-   rash or lesions. GI:  No- recognized  heartburn, indigestion, abdominal pain, nausea, vomiting,  GU: No-   dysuria,  MS:  No-   joint pain or swelling.  . Neuro-     nothing unusual Psych:  No- change in mood or affect. No depression or anxiety.  No memory loss.  OBJ- Physical Exam General- Alert, Oriented, Affect-appropriate, Distress- none acute Skin- rash-none, lesions- none, excoriation- none Lymphadenopathy- none Head- atraumatic            Eyes- Gross vision intact, PERRLA, conjunctivae and secretions clear            Ears- Hearing, canals-normal            Nose- Clear, no-Septal dev, mucus, polyps, erosion, perforation             Throat- Mallampati II , mucosa clear , drainage- none, tonsils- large, not obstructive Neck- flexible , trachea midline, no stridor , thyroid nl,  carotid no bruit Chest - symmetrical excursion , unlabored           Heart/CV- RRR , no murmur , no gallop  , no rub, nl s1 s2                           - JVD- none , edema- none, stasis changes- none, varices- none           Lung- +few basilar crackles, wheeze- none, cough- none , dullness-none, rub- none           Chest wall-  Abd- Br/ Gen/ Rectal- Not done, not indicated Extrem- cyanosis- none, clubbing, none, atrophy- none, strength- nl Neuro- grossly intact to observation

## 2013-03-14 NOTE — Patient Instructions (Addendum)
I think you are refluxing and. occasionally aspirating stomach juice into your lungs.  It is important not to eat for about 2 hours before you lie down  Recommend you elevate head of bed with a brick under each head leg of your bed.

## 2013-03-21 ENCOUNTER — Encounter: Payer: Self-pay | Admitting: Internal Medicine

## 2013-03-21 DIAGNOSIS — K449 Diaphragmatic hernia without obstruction or gangrene: Secondary | ICD-10-CM

## 2013-03-21 HISTORY — DX: Diaphragmatic hernia without obstruction or gangrene: K44.9

## 2013-03-21 NOTE — Assessment & Plan Note (Signed)
She associated with mail- handling equipment. She is no longer exposed and does not describe recent urticaria.

## 2013-03-21 NOTE — Assessment & Plan Note (Signed)
Her neurologic diagnosis of "stiff man syndrome" seems to have solidified her plans for permanent disability.

## 2013-03-21 NOTE — Assessment & Plan Note (Signed)
With hiatal hernia and description of cough when lying down. , This may have been an aspiration. I discussed reflux precautions and aspiration risk with her.

## 2013-03-21 NOTE — Assessment & Plan Note (Signed)
Plan-reflux precautions, suggest elevating head of bed with a break

## 2013-04-08 ENCOUNTER — Telehealth: Payer: Self-pay | Admitting: Neurology

## 2013-04-08 NOTE — Telephone Encounter (Signed)
Patient called and wanted doctors approval before she accepts a new position.  The job calls for sitting 7.5 hours a day with breaks.  She wants to know if you think it's ok for her to accept position.  [phone].

## 2013-04-08 NOTE — Telephone Encounter (Signed)
I called patient. I think that this type of job position would be fine for her. The patient will need to stand and stretch intermittently.

## 2013-04-21 ENCOUNTER — Telehealth: Payer: Self-pay | Admitting: Hematology & Oncology

## 2013-04-21 NOTE — Telephone Encounter (Signed)
Patient called and cx 04/30/13 apt and resch for 04/23/13

## 2013-04-23 ENCOUNTER — Ambulatory Visit (HOSPITAL_BASED_OUTPATIENT_CLINIC_OR_DEPARTMENT_OTHER): Payer: Federal, State, Local not specified - PPO | Admitting: Medical

## 2013-04-23 ENCOUNTER — Other Ambulatory Visit (HOSPITAL_BASED_OUTPATIENT_CLINIC_OR_DEPARTMENT_OTHER): Payer: Federal, State, Local not specified - PPO | Admitting: Lab

## 2013-04-23 ENCOUNTER — Ambulatory Visit (HOSPITAL_BASED_OUTPATIENT_CLINIC_OR_DEPARTMENT_OTHER): Payer: Federal, State, Local not specified - PPO

## 2013-04-23 VITALS — BP 92/58 | HR 64 | Temp 98.0°F | Resp 16 | Ht 62.0 in | Wt 175.0 lb

## 2013-04-23 DIAGNOSIS — D509 Iron deficiency anemia, unspecified: Secondary | ICD-10-CM

## 2013-04-23 DIAGNOSIS — D510 Vitamin B12 deficiency anemia due to intrinsic factor deficiency: Secondary | ICD-10-CM

## 2013-04-23 LAB — CBC WITH DIFFERENTIAL (CANCER CENTER ONLY)
BASO%: 0 % (ref 0.0–2.0)
EOS%: 0 % (ref 0.0–7.0)
LYMPH#: 2.3 10*3/uL (ref 0.9–3.3)
MCHC: 32.5 g/dL (ref 32.0–36.0)
MCV: 86 fL (ref 81–101)
MONO#: 0.5 10*3/uL (ref 0.1–0.9)
NEUT#: 3.6 10*3/uL (ref 1.5–6.5)
Platelets: 275 10*3/uL (ref 145–400)
RBC: 4.29 10*6/uL (ref 3.70–5.32)
RDW: 12.9 % (ref 11.1–15.7)
WBC: 6.4 10*3/uL (ref 3.9–10.0)

## 2013-04-23 LAB — IRON AND TIBC
%SAT: 37 % (ref 20–55)
Iron: 103 ug/dL (ref 42–145)
TIBC: 276 ug/dL (ref 250–470)
UIBC: 173 ug/dL (ref 125–400)

## 2013-04-23 MED ORDER — CYANOCOBALAMIN 1000 MCG/ML IJ SOLN
1000.0000 ug | Freq: Once | INTRAMUSCULAR | Status: AC
  Administered 2013-04-23: 1000 ug via INTRAMUSCULAR

## 2013-04-23 NOTE — Progress Notes (Signed)
Diagnoses: #1 pernicious anemia-anti intrinsic factor antibodies. #2 intermittent iron deficiency anemia. #3 severe low back pain, secondary to disc herniation. 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 #5.  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  Current therapy: #1. Vitamin B12 1 mg IM every 3 months. #2.  Intermittent iron infusions.  Last dose of IV iron was 01/29/2013  Interim history: Sherri [name] presents today for an office followup visit. .  Overall, she, reports, that she's doing relatively well.  She does start back to work next week, full-time, after been off one year.    Sherri [name] also states, that she was informed by her neurologist, Dr. 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  She has had a workup in the past for MS, which was negative., she, last received, IV iron.  Back in February.  Her iron level at that time, was 38, with 12% saturation.  She continues to receive vitamin B12 every 3 months.     She otherwise reports, that she's not having any headaches, any blurred, vision.  She's not having any mental status, changes.  She's not having any nausea, vomiting, diarrhea, constipation.  She denies any obvious, bleeding.  She denies any upper or lower extremity he swelling.  She has a good appetite.  She has a decent energy level.    Review of Systems: Constitutional:Negative for malaise/fatigue, fever, chills, weight loss, diaphoresis, activity change, appetite change, and unexpected weight change.  HEENT: Negative for double vision, blurred vision, visual loss, ear pain, tinnitus, congestion, rhinorrhea, epistaxis sore throat or sinus disease, oral pain/lesion, tongue soreness Respiratory: Negative for cough, chest tightness, shortness of breath, wheezing and stridor.  Cardiovascular: Negative for chest pain, palpitations, leg swelling, orthopnea, PND, DOE or  claudication Gastrointestinal: Negative for nausea, vomiting, abdominal pain, diarrhea, constipation, blood in stool, melena, hematochezia, abdominal distention, anal bleeding, rectal pain, anorexia and hematemesis.  Genitourinary: Negative for dysuria, frequency, hematuria,  Musculoskeletal: Negative for myalgias, back pain, joint swelling, arthralgias and gait problem. +for back spasms secondary to herniated disc. Skin: Negative for rash, color change, pallor and wound.  Neurological:. Negative for dizziness/light-headedness, tremors, seizures, syncope, facial asymmetry, speech difficulty, weakness, numbness, headaches and paresthesias.  Hematological: Negative for adenopathy. Does not bruise/bleed easily.  Psychiatric/Behavioral:  Negative for depression, no loss of interest in normal activity or change in sleep pattern.   Physical Exam: This is a 47 year old, well-developed, well-nourished, African American, female, in no obvious distress Vitals: Temperature 98.0 degrees, pulse 64, respirations 16, blood pressure 92, 58, weight 175 pounds HEENT reveals a normocephalic, atraumatic skull, no scleral icterus, no oral lesions  Neck is supple without any cervical or supraclavicular adenopathy.  Lungs are clear to auscultation bilaterally. There are no wheezes, rales or rhonci Cardiac is regular rate and rhythm with a normal S1 and S2. There are no murmurs, rubs, or bruits.  Abdomen is soft with good bowel sounds there's a palpable mass. There is no palpable hepatosplenomegaly. There is no palpable fluid wave.  Musculoskeletal no tenderness of the spine, ribs, or hips.  Extremities there are no clubbing, cyanosis, or edema.  Skin no petechia, purpura or ecchymosis Neurologic is nonfocal.  Laboratory Data:  white count 6.4, hemoglobin 12.0, hematocrit 36.9, platelets 275,000. MCV 86    Current Outpatient Prescriptions on File Prior to Visit  Medication Sig Dispense Refill  . Albuterol Sulfate  (PROAIR HFA IN)  Inhale 2 puffs into the lungs every 4 (four) hours as needed. Shortness of breath or wheezing      . baclofen (LIORESAL) 20 MG tablet 20 mg 3 (three) times daily.       . cyanocobalamin (,VITAMIN B-12,) 1000 MCG/ML injection Inject 1,000 mcg into the muscle every 3 (three) months.       . cyclobenzaprine (FLEXERIL) 10 MG tablet 10 mg 3 (three) times daily as needed.       . desloratadine (CLARINEX) 5 MG tablet Take 1 tablet (5 mg total) by mouth daily.  30 tablet  0  . diazepam (VALIUM) 10 MG tablet Take 20 mg TID      . EPINEPHrine (EPI-PEN) 0.3 mg/0.3 mL DEVI Inject 0.3 mg into the muscle as needed. For life threatening allergic reaction      . escitalopram (LEXAPRO) 20 MG tablet Take 20 mg by mouth daily.        . fluticasone (FLONASE) 50 MCG/ACT nasal spray Place 2 sprays into the nose daily as needed. For allergy      . Fluticasone-Salmeterol (ADVAIR) 250-50 MCG/DOSE AEPB Inhale 2 puffs into the lungs daily. 1 puff, twice daily- rinse mouth well after use      . gabapentin (NEURONTIN) 300 MG capsule Take 300 mg by mouth 3 (three) times daily.       [name] levocetirizine (XYZAL) 5 MG tablet Take 1 tablet (5 mg total) by mouth every evening.  30 tablet  0  . metoprolol tartrate (LOPRESSOR) 25 MG tablet Take 2 tablets (50 mg total) by mouth 2 (two) times daily.  60 tablet  1  . montelukast (SINGULAIR) 10 MG tablet Take 10 mg by mouth at bedtime.        [name] omeprazole (PRILOSEC) 40 MG capsule Take 40 mg by mouth daily.       No current facility-administered medications on file prior to visit.   Assessment/Plan: This is a 47 year old, female, with the following issues:  #1 pernicious anemia- She is doing well on the vitamin B12 injection every 3 months, and we will continue with this plan.  #2.  Iron deficiency anemia.  we are getting an iron panel on her.    #3 " stiff man syndrome"  this apparently is being followed by her neurologist.  #4.  Herniated disc.  She follows up with  Dr. Ophelia Charter regarding this issue.  #5 followup-we will follow back up with Sherri [name] in 3 months, but before then should there be questions or concerns.

## 2013-04-23 NOTE — Patient Instructions (Signed)

## 2013-04-27 ENCOUNTER — Encounter (HOSPITAL_COMMUNITY): Payer: Self-pay | Admitting: Emergency Medicine

## 2013-04-27 ENCOUNTER — Emergency Department (INDEPENDENT_AMBULATORY_CARE_PROVIDER_SITE_OTHER)
Admission: EM | Admit: 2013-04-27 | Discharge: 2013-04-27 | Disposition: A | Discharge status: Home / Self Care | Payer: Federal, State, Local not specified - PPO | Source: Home / Self Care | Attending: Emergency Medicine | Admitting: Emergency Medicine

## 2013-04-27 ENCOUNTER — Emergency Department (INDEPENDENT_AMBULATORY_CARE_PROVIDER_SITE_OTHER): Payer: Federal, State, Local not specified - PPO

## 2013-04-27 DIAGNOSIS — S93602A Unspecified sprain of left foot, initial encounter: Secondary | ICD-10-CM

## 2013-04-27 DIAGNOSIS — S93609A Unspecified sprain of unspecified foot, initial encounter: Secondary | ICD-10-CM

## 2013-04-27 MED ORDER — TRAMADOL HCL 50 MG PO TABS: 50.0000 mg | ORAL_TABLET | Freq: Four times a day (QID) | ORAL | Status: DC | PRN

## 2013-04-27 NOTE — ED Provider Notes (Signed)
History     CSN: [id number]  Arrival date & time 04/27/13  1650   First MD Initiated Contact with Patient 04/27/13 1812      Chief Complaint  Patient presents with  . Fall    tripped and fell down steps injuring left great toe.    (Consider location/radiation/quality/duration/timing/severity/associated sxs/prior treatment) HPI Comments: Patient presents urgent care describing she tripped and fell down the steps and injuring her left great toe. She has had surgery of a bunion. It happened today around 3:30. Since then she's been having significant discomfort in the dorsal aspect of her left foot mainly when she tries to move her left toe. Feels some shooting pains and pressure. Denies any numbness or tingling sensation, to the dorsal aspect of her left foot.  Patient is a 47 y.o. female presenting with fall. The history is provided by the patient.  Fall The accident occurred 3 to 5 hours ago. The fall occurred while walking. She landed on concrete. There was no blood loss. The pain is at a severity of 6/10. The pain is moderate. She was ambulatory at the scene. Pertinent negatives include no fever, no numbness and no nausea. The symptoms are aggravated by activity. She has tried ice for the symptoms.    Past Medical History  Diagnosis Date  . Pernicious anemia 10/30/2011  . Lumbar herniated disc   . Environmental allergies   . Angioedema   . DVT (deep venous thrombosis)   . Asthma   . Seasonal allergies   . Urticaria   . Chronic back pain   . Anxiety   . Depression   . GERD (gastroesophageal reflux disease)   . Arthritis   . Chronic kidney disease   . Peripheral vascular disease   . CAP (community acquired pneumonia) 08/29/2012  . Stiff person syndrome   . Hiatal hernia 03/21/2013    Past Surgical History  Procedure Laterality Date  . Hernia repair    . Inguinal hernia 1983  1983    Family History  Problem Relation Age of Onset  . Cancer Mother   . Other Mother   .  COPD Father   . Asthma Brother   . Cancer Brother     History  Substance Use Topics  . Smoking status: Never Smoker   . Smokeless tobacco: Not on file  . Alcohol Use: No     Comment: Occasional    OB History   Grav Para Term Preterm Abortions TAB SAB Ect Mult Living                  Review of Systems  Constitutional: Positive for activity change. Negative for fever and fatigue.  Gastrointestinal: Negative for nausea.  Musculoskeletal: Positive for joint swelling. Negative for myalgias, back pain and arthralgias.  Skin: Negative for color change and pallor.  Neurological: Negative for weakness and numbness.    Allergies  Tylenol and Amoxicillin  Home Medications   Current Outpatient Rx  Name  Route  Sig  Dispense  Refill  . Albuterol Sulfate (PROAIR HFA IN)   Inhalation   Inhale 2 puffs into the lungs every 4 (four) hours as needed. Shortness of breath or wheezing         . baclofen (LIORESAL) 20 MG tablet      20 mg 3 (three) times daily.          . cyanocobalamin (,VITAMIN B-12,) 1000 MCG/ML injection   Intramuscular   Inject 1,000 mcg into the  muscle every 3 (three) months.          . desloratadine (CLARINEX) 5 MG tablet   Oral   Take 1 tablet (5 mg total) by mouth daily.   30 tablet   0   . diazepam (VALIUM) 5 MG tablet   Oral   Take 5 mg by mouth 4 (four) times daily. 5 mg tab qid to= 20 mg daily.         . fluticasone (FLONASE) 50 MCG/ACT nasal spray   Nasal   Place 2 sprays into the nose daily as needed. For allergy         . gabapentin (NEURONTIN) 300 MG capsule   Oral   Take 300 mg by mouth 3 (three) times daily.          [name] levocetirizine (XYZAL) 5 MG tablet   Oral   Take 1 tablet (5 mg total) by mouth every evening.   30 tablet   0   . metoprolol tartrate (LOPRESSOR) 25 MG tablet   Oral   Take 2 tablets (50 mg total) by mouth 2 (two) times daily.   60 tablet   1   . montelukast (SINGULAIR) 10 MG tablet   Oral   Take 10  mg by mouth at bedtime.           [name] omeprazole (PRILOSEC) 40 MG capsule   Oral   Take 40 mg by mouth daily.         . cyclobenzaprine (FLEXERIL) 10 MG tablet      10 mg 3 (three) times daily as needed.          [name] EPINEPHrine (EPI-PEN) 0.3 mg/0.3 mL DEVI   Intramuscular   Inject 0.3 mg into the muscle as needed. For life threatening allergic reaction         . Fluticasone-Salmeterol (ADVAIR) 250-50 MCG/DOSE AEPB   Inhalation   Inhale 2 puffs into the lungs daily. 1 puff, twice daily- rinse mouth well after use         . traMADol (ULTRAM) 50 MG tablet   Oral   Take 1 tablet (50 mg total) by mouth every 6 (six) hours as needed for pain.   15 tablet   0     BP 104/68  Pulse 66  Temp(Src) 98.8 F (37.1 C) (Oral)  Resp 16  SpO2 99%  Physical Exam  Nursing note and vitals reviewed. Constitutional: She appears well-developed and well-nourished.  Musculoskeletal:       Feet:  Neurological: She is alert.  Skin: No rash noted. No erythema. No pallor.    ED Course  Procedures (including critical care time)  Labs Reviewed - No data to display Dg Foot Complete Left  04/27/2013  *RADIOLOGY REPORT*  Clinical Data: Fall.  Pain across MTP joints.  LEFT FOOT - COMPLETE 3+ VIEW  Comparison: None.  Findings: Mild hallux valgus at the first MTP joint. No acute bony abnormality.  Specifically, no fracture, subluxation, or dislocation.  Soft tissues are intact.  IMPRESSION: No acute bony abnormality.   Original Report Authenticated By: Charlett Nose, M.D.      1. Foot sprain, left, initial encounter       MDM  Patient has a appointment to see an orthopedic doctor early next week. Patient was put on a postop shoe encouraged to take tramadol for pain as needed. And to followup with a foot Dr. or an orthopedic Dr. if pain persists beyond 7-10 days or sooner if  it worsens. No obvious fractures or dislocations were reported by radiologist. No neurovascular deficits noted on  today's exam        Jimmie Molly, MD 04/27/13 1851

## 2013-04-27 NOTE — ED Notes (Signed)
Pt states that she tripped and fell down steps injuring left great toe. Incident happened today around 3:30 Hx of bunionectomy of the left great toe. Pt states unable to move toe. Shooting pain with pressure.

## 2013-04-30 ENCOUNTER — Ambulatory Visit: Payer: Self-pay

## 2013-04-30 ENCOUNTER — Ambulatory Visit: Payer: Self-pay | Admitting: Medical

## 2013-04-30 ENCOUNTER — Other Ambulatory Visit: Payer: Self-pay | Admitting: Lab

## 2013-05-01 ENCOUNTER — Other Ambulatory Visit: Payer: Self-pay | Admitting: *Deleted

## 2013-05-01 ENCOUNTER — Telehealth: Payer: Self-pay | Admitting: *Deleted

## 2013-05-01 DIAGNOSIS — D510 Vitamin B12 deficiency anemia due to intrinsic factor deficiency: Secondary | ICD-10-CM

## 2013-05-01 NOTE — Telephone Encounter (Signed)
Message copied by Anselm Jungling on Thu May 01, 2013 11:37 AM ------      Message from: Josph Macho      Created: Fri Apr 25, 2013  6:47 PM       Call - iron is low.  Needs 1 dose of IV Feraheme at 510mg .  Please set up!!  [name] ------

## 2013-05-01 NOTE — Telephone Encounter (Signed)
Called patient left message that she needs a dose of IV Feraheme.  Instructed patient to call scheduler to set this up.

## 2013-05-02 ENCOUNTER — Telehealth: Payer: Self-pay | Admitting: Hematology & Oncology

## 2013-05-02 NOTE — Telephone Encounter (Signed)
Pt made 5-16 iron appointment

## 2013-05-07 ENCOUNTER — Encounter: Payer: Self-pay | Admitting: Neurology

## 2013-05-07 ENCOUNTER — Telehealth: Payer: Self-pay | Admitting: *Deleted

## 2013-05-07 NOTE — Telephone Encounter (Signed)
Patient called stating she needs a letter stating that the medications that she's on causes drowsiness for her employer. Patient also stated she fell down her stairs and is taking Ultram.

## 2013-05-07 NOTE — Telephone Encounter (Signed)
I called patient. The patient is on a multitude of medications that may cause drowsiness and gait instability. The patient is falling asleep at work. I'll write a letter in this regard, but there may need to be some medication adjustments. The patient may have to eliminate the morning dose of gabapentin, and she is not to take the Flexeril during the day while working.

## 2013-05-09 ENCOUNTER — Ambulatory Visit (HOSPITAL_BASED_OUTPATIENT_CLINIC_OR_DEPARTMENT_OTHER): Payer: Federal, State, Local not specified - PPO

## 2013-05-09 VITALS — BP 107/72 | HR 76

## 2013-05-09 DIAGNOSIS — D510 Vitamin B12 deficiency anemia due to intrinsic factor deficiency: Secondary | ICD-10-CM

## 2013-05-09 DIAGNOSIS — D509 Iron deficiency anemia, unspecified: Secondary | ICD-10-CM

## 2013-05-09 MED ORDER — FERUMOXYTOL INJECTION 510 MG/17 ML
510.0000 mg | Freq: Once | INTRAVENOUS | Status: AC
  Administered 2013-05-09: 510 mg via INTRAVENOUS
  Filled 2013-05-09: qty 17

## 2013-05-09 MED ORDER — SODIUM CHLORIDE 0.9 % IV SOLN
Freq: Once | INTRAVENOUS | Status: AC
  Administered 2013-05-09: 14:00:00 via INTRAVENOUS

## 2013-05-09 NOTE — Patient Instructions (Addendum)
Ferumoxytol injection What is this medicine? FERUMOXYTOL is an iron complex. Iron is used to make healthy red blood cells, which carry oxygen and nutrients throughout the body. This medicine is used to treat iron deficiency anemia in people with chronic kidney disease. This medicine may be used for other purposes; ask your health care provider or pharmacist if you have questions. What should I tell my health care provider before I take this medicine? They need to know if you have any of these conditions: -anemia not caused by low iron levels -high levels of iron in the blood -magnetic resonance imaging (MRI) test scheduled -an unusual or allergic reaction to iron, other medicines, foods, dyes, or preservatives -pregnant or trying to get pregnant -breast-feeding How should I use this medicine? This medicine is for infusion into a vein. It is given by a health care professional in a hospital or clinic setting. Talk to your pediatrician regarding the use of this medicine in children. Special care may be needed. Overdosage: If you think you've taken too much of this medicine contact a poison control center or emergency room at once. Overdosage: If you think you have taken too much of this medicine contact a poison control center or emergency room at once. NOTE: This medicine is only for you. Do not share this medicine with others. What if I miss a dose? It is important not to miss your dose. Call your doctor or health care professional if you are unable to keep an appointment. What may interact with this medicine? This medicine may interact with the following medications: -other iron products This list may not describe all possible interactions. Give your health care provider a list of all the medicines, herbs, non-prescription drugs, or dietary supplements you use. Also tell them if you smoke, drink alcohol, or use illegal drugs. Some items may interact with your medicine. What should I watch  for while using this medicine? Visit your doctor or healthcare professional regularly. Tell your doctor or healthcare professional if your symptoms do not start to get better or if they get worse. You may need blood work done while you are taking this medicine. You may need to follow a special diet. Talk to your doctor. Foods that contain iron include: whole grains/cereals, dried fruits, beans, or peas, leafy green vegetables, and organ meats (liver, kidney). What side effects may I notice from receiving this medicine? Side effects that you should report to your doctor or health care professional as soon as possible: -allergic reactions like skin rash, itching or hives, swelling of the face, lips, or tongue -breathing problems -changes in blood pressure -feeling faint or lightheaded, falls -fever or chills -flushing, sweating, or hot feelings -swelling of the ankles or feet Side effects that usually do not require medical attention (Report these to your doctor or health care professional if they continue or are bothersome.): -diarrhea -headache -nausea, vomiting -stomach pain This list may not describe all possible side effects. Call your doctor for medical advice about side effects. You may report side effects to FDA at 1-800-FDA-1088. Where should I keep my medicine? This drug is given in a hospital or clinic and will not be stored at home. NOTE: This sheet is a summary. It may not cover all possible information. If you have questions about this medicine, talk to your doctor, pharmacist, or health care provider.  2013, [employer]vier/[employer] Standard. (09/02/2008 9:48:25 PM)  

## 2013-06-04 ENCOUNTER — Telehealth: Payer: Self-pay | Admitting: Neurology

## 2013-06-04 MED ORDER — DIAZEPAM 10 MG PO TABS: 10.0000 mg | ORAL_TABLET | Freq: Four times a day (QID) | ORAL | Status: DC | PRN

## 2013-06-04 NOTE — Telephone Encounter (Signed)
I called patient. The patient indicates that she is having significant spasms, and anxiety today. The patient has been unable to work. The patient indicates that the spasms overall have gradually worsened over time. We'll increase the diazepam to 10 mg 3 times daily. The patient indicates some discomfort in the right arm, difficulty lifting the right arm up over her head. If this does not improve, we may need to see the patient in revisit. A note was given for the patient remain out of work today.

## 2013-06-04 NOTE — Telephone Encounter (Signed)
Patient is calling to tell us she's having severe muscle spasms in her back.  She can not lift her R arm above her head, pain in R shoulder and she also has panic attacks and anxiety.  The patient would like for someone to call her today if at all possible.

## 2013-06-20 ENCOUNTER — Encounter: Payer: Self-pay | Admitting: Internal Medicine

## 2013-06-20 ENCOUNTER — Telehealth: Payer: Self-pay | Admitting: Internal Medicine

## 2013-06-20 ENCOUNTER — Encounter: Payer: Self-pay | Admitting: *Deleted

## 2013-06-20 ENCOUNTER — Ambulatory Visit (INDEPENDENT_AMBULATORY_CARE_PROVIDER_SITE_OTHER): Payer: Federal, State, Local not specified - PPO | Admitting: Internal Medicine

## 2013-06-20 VITALS — BP 94/66 | HR 60 | Ht 63.0 in | Wt 171.2 lb

## 2013-06-20 DIAGNOSIS — J4520 Mild intermittent asthma, uncomplicated: Secondary | ICD-10-CM

## 2013-06-20 DIAGNOSIS — J45909 Unspecified asthma, uncomplicated: Secondary | ICD-10-CM

## 2013-06-20 DIAGNOSIS — L509 Urticaria, unspecified: Secondary | ICD-10-CM

## 2013-06-20 MED ORDER — ALBUTEROL SULFATE HFA 108 (90 BASE) MCG/ACT IN AERS: 2.0000 | INHALATION_SPRAY | Freq: Four times a day (QID) | RESPIRATORY_TRACT | Status: DC | PRN

## 2013-06-20 MED ORDER — METHYLPREDNISOLONE ACETATE 80 MG/ML IJ SUSP
80.0000 mg | Freq: Once | INTRAMUSCULAR | Status: AC
  Administered 2013-06-20: 80 mg via INTRAMUSCULAR

## 2013-06-20 NOTE — Progress Notes (Signed)
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 To avoid exposure to paper dust and related products, she was working in  office. 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 She admits she was very anxious walking in on the first day and that some symptoms might just be from nervousness. She felt some itching, wheeze and cough. She felt hoarse which worried her because she needs to sing in church. Sinuses are draining. She did have an upper respiratory infection this winter. Has never had seasonal pollen rhinitis before. Medications reviewed-Flonase, Advair 250, Xyzal, Singulair, pro air, EpiPen (never needed).  03/14/13- 21 yoF never smoker followed for asthma complicated by hx of DVT, urticaria/ angioedema, DGD FOLLOWS FOR: slight wheezing at times(not bad-happens when she eats and then lays down). 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 Now diagnosed with "stiff person syndrome" for which her activity is restricted by Dr. Willis/neurology. Lying down she notices some wheeze or cough suggesting reflux but does not feel heartburn and does not wake up choking. Sleeps elevated on 4 pillows. Has been working with psychiatrist. Does not describe urticaria now. CT chest 08/29/12 IMPRESSION:  1. No evidence of pulmonary abscess. The air-fluid level noted on  recent chest radiograph corresponds to a large hiatal hernia.  2. There is a small focus of airspace consolidation in the right  upper lobe which is concerning for acute infection.  3. Areas of subsegmental atelectasis are noted in the lower lobes  of the lungs bilaterally.  Original Report Authenticated By: Florencia Reasons, M.D.   06/20/13- 30 yoF never smoker followed for asthma complicated by hx of DVT, urticaria/ angioedema, DGD Acute Visit- hives on elbows  started last night.  Has spread to arms.  Swelling in bottom lip this morning. 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 Yesterday wearing a sweater felt very tired and developed hives on her elbows. Cough with scant gray sputum-used albuterol. Denies having a cold. No wheeze or angioedema. Taking Xyzal, Clarinex, Singulair. , iron infusion in April  ROS-see HPI Constitutional:   No-   weight loss, night sweats, fevers, chills, +fatigue, lassitude. HEENT:   No-  headaches, difficulty swallowing, tooth/dental problems, sore throat,       No-  sneezing, itching, ear ache, nasal congestion,+ post nasal drip,  CV:  No-   chest pain, orthopnea, PND, swelling in lower extremities, anasarca, dizziness, palpitations Resp: No-   shortness of breath with exertion or at rest.              No-   productive cough,  +non-productive cough,  No- coughing up of blood.              No-   change in color of mucus.  + wheezing.   Skin:+ HPI GI:  No- recognized  heartburn, indigestion, abdominal pain, nausea, vomiting,  GU: No-   dysuria,  MS:  No-   joint pain or swelling.  . Neuro-     nothing unusual Psych:  No- change in mood or affect. No depression or anxiety.  No memory loss.  OBJ- Physical Exam General- Alert, Oriented, Affect-appropriate, Distress- none acute Skin- r+faint erythema blotches on arms, +mild edema lower lip Lymphadenopathy- none Head- atraumatic  Eyes- Gross vision intact, PERRLA, conjunctivae and secretions clear            Ears- Hearing, canals-normal            Nose- Clear, no-Septal dev, mucus, polyps, erosion, perforation             Throat- Mallampati II , mucosa clear , drainage- none, tonsils- large, not obstructive Neck- flexible , trachea midline, no stridor , thyroid nl, carotid no bruit Chest - symmetrical excursion , unlabored           Heart/CV- RRR , no murmur , no gallop  , no rub, nl s1 s2                           - JVD- none ,  edema- none, stasis changes- none, varices- none           Lung- +few basilar crackles, wheeze- none, cough- none , dullness-none, rub- none           Chest wall-  Abd- Br/ Gen/ Rectal- Not done, not indicated Extrem- cyanosis- none, clubbing, none, atrophy- none, strength- nl Neuro- grossly intact to observation

## 2013-06-20 NOTE — Patient Instructions (Addendum)
Depo 80  Refill script for albuterol sent  Try  otc pepcid 20 mg (H2 antihistamine) twice daily before meals for a few days, instead of omeprazole. When you run out of the pepcid, try going back to omeprazole  Continue Xyzal, Singulair and Clarinex  Note for work seen today, may return to work Monday

## 2013-06-20 NOTE — Telephone Encounter (Signed)
Spoke with pt  She is c/o hives that started last night on both elbows and now has spread up both arms and on both legs She is starting to notice her bottom lip is swollen She states had cough all night that was prod with grey sputum  Denies any dysphagia or SOB  OV with CDY this pm at 4 Advised her to seek emergent care sooner if needed

## 2013-06-26 ENCOUNTER — Telehealth: Payer: Self-pay | Admitting: Internal Medicine

## 2013-06-26 ENCOUNTER — Encounter: Payer: Self-pay | Admitting: *Deleted

## 2013-06-26 MED ORDER — PREDNISONE 10 MG PO TABS: ORAL_TABLET | ORAL | Status: DC

## 2013-06-26 MED ORDER — HYDROXYZINE HCL 10 MG PO TABS: 10.0000 mg | ORAL_TABLET | Freq: Three times a day (TID) | ORAL | Status: DC | PRN

## 2013-06-26 NOTE — Telephone Encounter (Signed)
Called and spoke with pt and she is aware that letter has been done per CY.  Pt requested that this letter be faxed to [phone]  ATTN:  Theola Sequin or Donell Sievert.  Pt is aware that this will be faxed for her.

## 2013-06-26 NOTE — Telephone Encounter (Signed)
Pt just called back again. She states she still has the same sx and wants a call back asap please. Florentina Addison will look into this and call pt back asap. Hazel Sams

## 2013-06-26 NOTE — Telephone Encounter (Signed)
I spoke with Sherri [name]. She stated she has broke out in hives x last night. All over the back of legs, chest, arms, and side of neck. She is also wheezing and sweating. She used her albuterol inhaler, taking clarinex, Singulair, and xyzal. She is also taking pepcid. She is requesting further recs. Please advise Dr. Maple Hudson thanks Last OV 06/20/13 06/24/14 pending Allergies  Allergen Reactions  . Tylenol (Acetaminophen) Hives  . Amoxicillin Diarrhea

## 2013-06-26 NOTE — Telephone Encounter (Signed)
Pt called back again; "anxious to hear from dr / nurse asap". Sherri [name]

## 2013-06-27 ENCOUNTER — Other Ambulatory Visit: Payer: Self-pay | Admitting: Internal Medicine

## 2013-07-06 NOTE — Assessment & Plan Note (Signed)
Increased cough without wheeze. Plan-Depo-Medrol, refill rescue inhaler

## 2013-07-06 NOTE — Assessment & Plan Note (Signed)
Urticaria with angioedema. 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 Plan-change omeprazole to H2-blocker Pepcid, Depo-Medrol

## 2013-07-09 ENCOUNTER — Telehealth: Payer: Self-pay | Admitting: Neurology

## 2013-07-23 ENCOUNTER — Ambulatory Visit: Payer: Self-pay | Admitting: Hematology & Oncology

## 2013-07-23 ENCOUNTER — Other Ambulatory Visit: Payer: Self-pay | Admitting: Lab

## 2013-07-23 ENCOUNTER — Telehealth: Payer: Self-pay | Admitting: Hematology & Oncology

## 2013-07-23 ENCOUNTER — Ambulatory Visit: Payer: Self-pay

## 2013-07-23 NOTE — Telephone Encounter (Signed)
Left message for pt to call and schedule appointment °

## 2013-07-24 ENCOUNTER — Ambulatory Visit (HOSPITAL_BASED_OUTPATIENT_CLINIC_OR_DEPARTMENT_OTHER): Payer: Federal, State, Local not specified - PPO | Admitting: Hematology & Oncology

## 2013-07-24 ENCOUNTER — Ambulatory Visit (HOSPITAL_BASED_OUTPATIENT_CLINIC_OR_DEPARTMENT_OTHER): Payer: Federal, State, Local not specified - PPO

## 2013-07-24 ENCOUNTER — Other Ambulatory Visit (HOSPITAL_BASED_OUTPATIENT_CLINIC_OR_DEPARTMENT_OTHER): Payer: Federal, State, Local not specified - PPO | Admitting: Lab

## 2013-07-24 VITALS — BP 115/70 | HR 61 | Temp 97.9°F | Resp 16 | Ht 63.0 in | Wt 163.0 lb

## 2013-07-24 DIAGNOSIS — D510 Vitamin B12 deficiency anemia due to intrinsic factor deficiency: Secondary | ICD-10-CM

## 2013-07-24 DIAGNOSIS — D509 Iron deficiency anemia, unspecified: Secondary | ICD-10-CM

## 2013-07-24 LAB — CBC WITH DIFFERENTIAL (CANCER CENTER ONLY)
BASO#: 0 10*3/uL (ref 0.0–0.2)
BASO%: 0.1 % (ref 0.0–2.0)
EOS%: 0 % (ref 0.0–7.0)
Eosinophils Absolute: 0 10*3/uL (ref 0.0–0.5)
HCT: 37.8 % (ref 34.8–46.6)
HGB: 12.5 g/dL (ref 11.6–15.9)
LYMPH%: 31.8 % (ref 14.0–48.0)
MCH: 28.8 pg (ref 26.0–34.0)
MCV: 87 fL (ref 81–101)
MONO#: 0.6 10*3/uL (ref 0.1–0.9)
MONO%: 6.6 % (ref 0.0–13.0)
NEUT#: 5.4 10*3/uL (ref 1.5–6.5)
NEUT%: 61.5 % (ref 39.6–80.0)
Platelets: 294 10*3/uL (ref 145–400)
RBC: 4.34 10*6/uL (ref 3.70–5.32)
WBC: 8.8 10*3/uL (ref 3.9–10.0)

## 2013-07-24 LAB — IRON AND TIBC CHCC
%SAT: 28 % (ref 21–57)
TIBC: 281 ug/dL (ref 236–444)
UIBC: 203 ug/dL (ref 120–384)

## 2013-07-24 LAB — VITAMIN B12: Vitamin B-12: 685 pg/mL (ref 211–911)

## 2013-07-24 MED ORDER — CYANOCOBALAMIN 1000 MCG/ML IJ SOLN
1000.0000 ug | Freq: Once | INTRAMUSCULAR | Status: AC
  Administered 2013-07-24: 1000 ug via INTRAMUSCULAR

## 2013-07-24 NOTE — Progress Notes (Signed)
This office note has been dictated.

## 2013-07-25 ENCOUNTER — Telehealth: Payer: Self-pay | Admitting: *Deleted

## 2013-07-25 NOTE — Telephone Encounter (Addendum)
Message copied by Mirian Capuchin on Fri Jul 25, 2013  4:04 PM ------      Message from: Arlan Organ R      Created: Thu Jul 24, 2013  5:56 PM       Call - iron is still ok!!  Cindee Lame ------This message given to pt.  Voiced understanding.

## 2013-07-25 NOTE — Progress Notes (Signed)
CC:   Robyn N. Allyne Gee, M.D.  DIAGNOSES: 1. Pernicious anemia with anti-intrinsic factor antibodies. 2. Intermittent iron deficiency anemia. 3. 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 4. 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  CURRENT THERAPY: 1. Vitamin B12 1 mg IM every 3 months. 2. Intermittent iron as indicated.  INTERIM HISTORY:  Ms. Lobosco comes in for followup.  We last saw her back in April.  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  Unfortunately, because of financial issues, she lost her house.  There have been some family issues.  She has tried to do as much as she can to try to help with her family.  She has had no bleeding.  She has had no nausea or vomiting.  There has been no leg swelling.  There is still some debate as to whether or not to have back surgery.  PHYSICAL EXAMINATION:  General:  This is a well-developed, well- nourished African American female in no obvious distress.  Vital signs: Temperature of 97.9, pulse 61, respiratory rate 16, blood pressure 115/79.  Weight is 163.  Head and neck:  Normocephalic, atraumatic skull.  There are no ocular or oral lesions.  There are no palpable cervical or supraclavicular lymph nodes.  Lungs:  Clear bilaterally. Cardiac:  Regular rate and rhythm with a normal S1, S2.  There are no murmurs, rubs or bruits.  Abdomen:  Soft.  She has good bowel sounds. There is no palpable abdominal mass.  There is no fluid wave.  There is no palpable hepatosplenomegaly.  Extremities:  Show no clubbing, cyanosis or edema.  She has good range motion of her joints. Neurological:  Shows no focal neurological deficits.  LABORATORY STUDIES:  White cell count is 8.8, hemoglobin 12.5, hematocrit 37.8, platelet count 294.  IMPRESSION:  Ms. Clapp is a very nice 47 year old African American female with intermittent iron deficiency anemia.  She should be okay with iron given that her hemoglobin continues to  trend upward.  We will go ahead and give her vitamin B12.  Her last B12 level back in April was 444.  We will plan for another followup in 3 months.  I do not think we need any blood work in between visits.    ______________________________ Josph Macho, M.D. PRE/MEDQ  D:  07/24/2013  T:  07/25/2013  Job:  [id number]

## 2013-07-31 ENCOUNTER — Other Ambulatory Visit: Payer: Self-pay

## 2013-07-31 MED ORDER — DIAZEPAM 10 MG PO TABS: 10.0000 mg | ORAL_TABLET | Freq: Three times a day (TID) | ORAL | Status: DC

## 2013-07-31 NOTE — Telephone Encounter (Signed)
Rx signed and faxed.

## 2013-08-01 ENCOUNTER — Telehealth: Payer: Self-pay | Admitting: Neurology

## 2013-08-04 ENCOUNTER — Telehealth: Payer: Self-pay | Admitting: Neurology

## 2013-08-04 MED ORDER — DIAZEPAM 10 MG PO TABS: ORAL_TABLET | ORAL | Status: DC

## 2013-08-04 NOTE — Telephone Encounter (Signed)
I called the patient. The patient has had some ongoing problems with twitching in the hands. The patient apparently has been increased on the diazepam over the summer from 10 mg 3 times daily to 4 times daily, and over the weekend, she was told to go to 5 times daily. We will adjust the medication prescription accordingly.

## 2013-08-04 NOTE — Telephone Encounter (Signed)
Pt was called by Dr. Vickey Huger over the weekend.

## 2013-08-04 NOTE — Telephone Encounter (Signed)
Patient reportedly diagnosed with stiff person syndrome, cold about 9 PM on Friday night asking for permission to increase her Valium  from 4x10 mg daily  To 5 times 10 mg daily. She reported being diagnosed about a year ago and that Valium has given her some relief but that the current all seems to wear off quickly now. AP for permission to increase her dose, and asked her to call Dr. Anne Hahn on Monday to see if any other adjustments are necessary. I also asked her to call and make sure she will not run out of  Valium earlier.

## 2013-09-28 ENCOUNTER — Emergency Department (INDEPENDENT_AMBULATORY_CARE_PROVIDER_SITE_OTHER)
Admission: EM | Admit: 2013-09-28 | Discharge: 2013-09-28 | Disposition: A | Discharge status: Home / Self Care | Payer: Federal, State, Local not specified - PPO | Source: Home / Self Care | Attending: Emergency Medicine | Admitting: Emergency Medicine

## 2013-09-28 ENCOUNTER — Encounter (HOSPITAL_COMMUNITY): Payer: Self-pay | Admitting: *Deleted

## 2013-09-28 DIAGNOSIS — M7501 Adhesive capsulitis of right shoulder: Secondary | ICD-10-CM

## 2013-09-28 DIAGNOSIS — M7500 Adhesive capsulitis of unspecified shoulder: Secondary | ICD-10-CM

## 2013-09-28 DIAGNOSIS — J069 Acute upper respiratory infection, unspecified: Secondary | ICD-10-CM

## 2013-09-28 NOTE — ED Provider Notes (Signed)
Medical screening examination/treatment/procedure(s) were performed by a resident physician and as supervising physician I was immediately available for consultation/collaboration.  Leslee Home, M.D.  Reuben Likes, MD 09/28/13 [phone]

## 2013-09-28 NOTE — ED Provider Notes (Signed)
CSN: [id number]     Arrival date & time 09/28/13  1336 History   First MD Initiated Contact with Patient 09/28/13 1405     Chief Complaint  Patient presents with  . Cough  . Shoulder Pain  . Elbow Pain   (Consider location/radiation/quality/duration/timing/severity/associated sxs/prior Treatment) Patient is a 47 y.o. female presenting with wheezing and shoulder pain. The history is provided by the patient.  Wheezing Severity:  Mild Severity compared to prior episodes:  Less severe Onset quality:  Sudden Duration:  1 day Timing:  Intermittent Progression:  Waxing and waning Chronicity:  New Relieved by:  Beta-agonist inhaler Worsened by:  Nothing tried Ineffective treatments:  None tried Associated symptoms: cough and fatigue   Associated symptoms: no chest pain, no chest tightness, no ear pain, no fever, no headaches, no shortness of breath and no swollen glands   Shoulder Pain This is a chronic problem. The current episode started more than 1 week ago. The problem occurs constantly. The problem has been gradually worsening. Pertinent negatives include no chest pain, no headaches and no shortness of breath. Exacerbated by: abduction. Nothing relieves the symptoms. Treatments tried: stretching.    Past Medical History  Diagnosis Date  . Pernicious anemia 10/30/2011  . Lumbar herniated disc   . Environmental allergies   . Angioedema   . DVT (deep venous thrombosis)   . Asthma   . Seasonal allergies   . Urticaria   . Chronic back pain   . Anxiety   . Depression   . GERD (gastroesophageal reflux disease)   . Arthritis   . Chronic kidney disease   . Peripheral vascular disease   . CAP (community acquired pneumonia) 08/29/2012  . Stiff person syndrome   . Hiatal hernia 03/21/2013   Past Surgical History  Procedure Laterality Date  . Hernia repair    . Inguinal hernia 1983  1983   Family History  Problem Relation Age of Onset  . Cancer Mother   . Other Mother   . COPD  Father   . Asthma Brother   . Cancer Brother    History  Substance Use Topics  . Smoking status: Never Smoker   . Smokeless tobacco: Not on file  . Alcohol Use: Yes     Comment: Occasional   OB History   Grav Para Term Preterm Abortions TAB SAB Ect Mult Living                 Review of Systems  Constitutional: Positive for fatigue. Negative for fever.  HENT: Negative for ear pain.   Eyes: Negative.   Respiratory: Positive for cough and wheezing. Negative for chest tightness and shortness of breath.   Cardiovascular: Negative for chest pain.  Gastrointestinal: Negative.   Genitourinary: Negative.   Musculoskeletal:       Right shoulder stiffness   Neurological: Negative for headaches.    Allergies  Tylenol; Amoxicillin; and Ibuprofen  Home Medications   Current Outpatient Rx  Name  Route  Sig  Dispense  Refill  . albuterol (PROVENTIL HFA;VENTOLIN HFA) 108 (90 BASE) MCG/ACT inhaler   Inhalation   Inhale 2 puffs into the lungs every 6 (six) hours as needed for wheezing or shortness of breath.   1 Inhaler   6   . baclofen (LIORESAL) 20 MG tablet      20 mg 3 (three) times daily.          . cyanocobalamin (,VITAMIN B-12,) 1000 MCG/ML injection   Intramuscular  Inject 1,000 mcg into the muscle every 3 (three) months.          . cyclobenzaprine (FLEXERIL) 10 MG tablet      10 mg every morning.          . desloratadine (CLARINEX) 5 MG tablet   Oral   Take 1 tablet (5 mg total) by mouth daily.   30 tablet   0   . diazepam (VALIUM) 10 MG tablet      One tablet five times a day   270 tablet   1     Pharmacy Fax [phone]   . EPINEPHrine (EPI-PEN) 0.3 mg/0.3 mL DEVI   Intramuscular   Inject 0.3 mg into the muscle as needed. For life threatening allergic reaction         . Fluticasone-Salmeterol (ADVAIR) 250-50 MCG/DOSE AEPB   Inhalation   Inhale 2 puffs into the lungs daily. 1 puff, twice daily- rinse mouth well after use         .  gabapentin (NEURONTIN) 300 MG capsule   Oral   Take 300 mg by mouth 3 (three) times daily.          . hydrOXYzine (ATARAX/VISTARIL) 10 MG tablet   Oral   Take 1 tablet (10 mg total) by mouth 3 (three) times daily as needed for itching.   30 tablet   0   . levocetirizine (XYZAL) 5 MG tablet   Oral   Take 1 tablet (5 mg total) by mouth every evening.   30 tablet   0   . montelukast (SINGULAIR) 10 MG tablet   Oral   Take 10 mg by mouth at bedtime.           [name] omeprazole (PRILOSEC) 40 MG capsule   Oral   Take 40 mg by mouth daily.         . fluticasone (FLONASE) 50 MCG/ACT nasal spray   Nasal   Place 2 sprays into the nose daily as needed. For allergy         . EXPIRED: metoprolol tartrate (LOPRESSOR) 25 MG tablet   Oral   Take by mouth. 2 in the am and 1 at bedtime         . Vitamin D, Ergocalciferol, (DRISDOL) 50000 UNITS CAPS      50,000 Units 2 (two) times a week.           BP 124/84  Pulse 110  Temp(Src) 98.2 F (36.8 C) (Oral)  Resp 18  SpO2 100% Physical Exam  Constitutional: She appears well-developed and well-nourished. No distress.  HENT:  Head: Normocephalic and atraumatic.  Mouth/Throat: Oropharynx is clear and moist.  Eyes: Right eye exhibits no discharge. Left eye exhibits no discharge.  Neck: Normal range of motion. Neck supple.  Pulmonary/Chest: Effort normal and breath sounds normal. No respiratory distress. She has no wheezes. She has no rales. She exhibits tenderness.  Musculoskeletal:       Right shoulder: She exhibits decreased range of motion. She exhibits no tenderness, no swelling and no deformity.  Decreased external range of motion and abduction of right shoulder with strength maintained  Lymphadenopathy:    She has no cervical adenopathy.  Skin: She is not diaphoretic.    ED Course  Procedures (including critical care time) Labs Review Labs Reviewed - No data to display Imaging Review No results found.  MDM   1.  Viral URI with cough   2. Adhesive capsulitis, right    Patient  with cough and intermittent wheezing, however neither was present during the exam today. She was breathing without difficulty and had clear auscultation bilaterally. Therefore no intervention was needed. She likely has a viral respiratory illness causing a cough which may induce some wheezing due to bronchoconstriction. The patient was instructed to use her albuterol and Advair at regular intervals as prescribed. She is agreeable with this plan  Decrease range of motion right shoulder slightly due to adhesive capsulitis considering this is an acute on chronic issue. There is no concern for acute joint injury or infection. Therefore no imaging was performed. The patient was given instruction for basic range of motion exercises and encouraged to follow up with her PCP. She is agreeable with this plan.     Garnetta Buddy, MD 09/28/13 [phone]

## 2013-09-28 NOTE — ED Notes (Signed)
C/O wheezing and cough since yesterday despite using albuterol MDI once daily per PCP instructions.  Denies fevers.  Also c/o right elbow and shoulder pain; pain started several days following a fall 3 wks ago.  C/O inability to lift RUE up.  BBS clear.

## 2013-10-08 ENCOUNTER — Other Ambulatory Visit: Payer: Self-pay | Admitting: Neurology

## 2013-10-08 MED ORDER — DIAZEPAM 10 MG PO TABS: ORAL_TABLET | ORAL | Status: DC

## 2013-10-08 NOTE — Telephone Encounter (Signed)
Rx signed and faxed.

## 2013-10-24 ENCOUNTER — Other Ambulatory Visit (HOSPITAL_BASED_OUTPATIENT_CLINIC_OR_DEPARTMENT_OTHER): Payer: Federal, State, Local not specified - PPO | Admitting: Lab

## 2013-10-24 ENCOUNTER — Ambulatory Visit (HOSPITAL_BASED_OUTPATIENT_CLINIC_OR_DEPARTMENT_OTHER): Payer: Federal, State, Local not specified - PPO

## 2013-10-24 ENCOUNTER — Ambulatory Visit (HOSPITAL_BASED_OUTPATIENT_CLINIC_OR_DEPARTMENT_OTHER): Payer: Federal, State, Local not specified - PPO | Admitting: Hematology & Oncology

## 2013-10-24 VITALS — BP 133/79 | HR 108 | Temp 97.9°F | Resp 14 | Ht 63.0 in | Wt 163.0 lb

## 2013-10-24 DIAGNOSIS — Z86718 Personal history of other venous thrombosis and embolism: Secondary | ICD-10-CM

## 2013-10-24 DIAGNOSIS — D509 Iron deficiency anemia, unspecified: Secondary | ICD-10-CM

## 2013-10-24 DIAGNOSIS — D510 Vitamin B12 deficiency anemia due to intrinsic factor deficiency: Secondary | ICD-10-CM

## 2013-10-24 DIAGNOSIS — G2582 Stiff-man syndrome: Secondary | ICD-10-CM

## 2013-10-24 LAB — CBC WITH DIFFERENTIAL (CANCER CENTER ONLY)
BASO#: 0 10*3/uL (ref 0.0–0.2)
BASO%: 0 % (ref 0.0–2.0)
EOS%: 0 % (ref 0.0–7.0)
Eosinophils Absolute: 0 10*3/uL (ref 0.0–0.5)
HGB: 11.9 g/dL (ref 11.6–15.9)
LYMPH#: 2.2 10*3/uL (ref 0.9–3.3)
MCH: 28.3 pg (ref 26.0–34.0)
MONO%: 6.7 % (ref 0.0–13.0)
NEUT#: 4.7 10*3/uL (ref 1.5–6.5)
Platelets: 313 10*3/uL (ref 145–400)
RBC: 4.2 10*6/uL (ref 3.70–5.32)
RDW: 13 % (ref 11.1–15.7)
WBC: 7.4 10*3/uL (ref 3.9–10.0)

## 2013-10-24 MED ORDER — CYANOCOBALAMIN 1000 MCG/ML IJ SOLN
INTRAMUSCULAR | Status: AC
  Filled 2013-10-24: qty 1

## 2013-10-24 MED ORDER — CYANOCOBALAMIN 1000 MCG/ML IJ SOLN
1000.0000 ug | Freq: Once | INTRAMUSCULAR | Status: AC
  Administered 2013-10-24: 1000 ug via INTRAMUSCULAR

## 2013-10-24 NOTE — Patient Instructions (Signed)

## 2013-10-24 NOTE — Progress Notes (Signed)
This office note has been dictated.

## 2013-10-25 NOTE — Progress Notes (Signed)
CC:   Sherri [name], M.D.  DIAGNOSIS: 1. Pernicious anemia-anti intrinsic factor antibodies. 2. Intermittent iron-deficiency anemia. 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 4. 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  CURRENT THERAPY: 1. Vitamin B12 at 1 mg IM q.3 months. 2. Intermittent iron infusions as necessary.  INTERIM HISTORY:  Sherri [name] comes in for a followup.  She is doing okay.  She is moving to a new apartment.  This has made her happy.  She also has disability now.  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  When we last saw her in July, her vitamin B12 was 685, her ferritin was 508 with an iron saturation of 28%.  She has had no problems with obvious bleeding.  There has been no nausea or vomiting.  There has been no fever, sweats, or chills.  PHYSICAL EXAMINATION:  General:  This is a petite African American female in no obvious distress.  Vital signs:  Temperature of 97.9, pulse 108, respiratory rate 14, blood pressure 133/79.  Weight is 163 pounds. Head and neck:  Normocephalic, atraumatic skull.  There are no ocular or oral lesions.  There are no palpable cervical or supraclavicular lymph nodes.  Lungs:  Clear bilaterally.  Cardiac:  Regular rate and rhythm with a normal S1, S2.  There are no murmurs, rubs or bruits.  Abdomen: Soft.  She has good bowel sounds.  There is no fluid wave.  There is no palpable hepatosplenomegaly.  Extremities:  Show no clubbing, cyanosis or edema.  Neurological:  Shows no focal neurological deficits.  Skin: No rashes, ecchymosis, or petechia.  LABORATORY STUDIES:  White cell count is 7.4, hemoglobin 12, hematocrit 36.6, platelet count 313.  MCV is 87.  IMPRESSION:  Sherri [name] is very nice 47 year old African female with pernicious anemia.  She also has a history of iron-deficiency anemia.  We will go ahead and give her the B12 shot today.  I would not think that we need any iron, but we will check her out  for this.  Some of her ferritin elevation could certainly be an acute phase reactant.  We will plan to get her back in 3 more months.    ______________________________ Josph Macho, M.D. PRE/MEDQ  D:  10/24/2013  T:  10/25/2013  Job:  [id number]

## 2013-10-27 LAB — IRON AND TIBC CHCC
Iron: 102 ug/dL (ref 41–142)
TIBC: 248 ug/dL (ref 236–444)
UIBC: 145 ug/dL (ref 120–384)

## 2013-10-27 LAB — FERRITIN CHCC: Ferritin: 396 ng/ml — ABNORMAL HIGH (ref 9–269)

## 2013-10-30 ENCOUNTER — Telehealth: Payer: Self-pay | Admitting: *Deleted

## 2013-10-30 NOTE — Telephone Encounter (Signed)
Called patient to let her know that her iron levels looked good per dr. Myna Hidalgo.  Left message on patients personal cell phone

## 2013-10-30 NOTE — Telephone Encounter (Signed)
Message copied by Anselm Jungling on Thu Oct 30, 2013 12:05 PM ------      Message from: Arlan Organ R      Created: Mon Oct 27, 2013  5:17 PM       Call - iron is ok!!  Cindee Lame ------

## 2013-12-17 DIAGNOSIS — Z0289 Encounter for other administrative examinations: Secondary | ICD-10-CM

## 2014-01-02 ENCOUNTER — Telehealth: Payer: Self-pay | Admitting: Neurology

## 2014-01-02 NOTE — Telephone Encounter (Signed)
Encounter opened in error

## 2014-01-26 ENCOUNTER — Encounter: Payer: Self-pay | Admitting: Hematology & Oncology

## 2014-01-26 ENCOUNTER — Ambulatory Visit (HOSPITAL_BASED_OUTPATIENT_CLINIC_OR_DEPARTMENT_OTHER): Payer: Federal, State, Local not specified - PPO | Admitting: Hematology & Oncology

## 2014-01-26 ENCOUNTER — Other Ambulatory Visit (HOSPITAL_BASED_OUTPATIENT_CLINIC_OR_DEPARTMENT_OTHER): Payer: Federal, State, Local not specified - PPO | Admitting: Lab

## 2014-01-26 ENCOUNTER — Ambulatory Visit (HOSPITAL_BASED_OUTPATIENT_CLINIC_OR_DEPARTMENT_OTHER): Payer: Federal, State, Local not specified - PPO

## 2014-01-26 VITALS — BP 117/72 | HR 81 | Temp 97.9°F | Resp 14 | Ht 60.0 in | Wt 158.0 lb

## 2014-01-26 DIAGNOSIS — D510 Vitamin B12 deficiency anemia due to intrinsic factor deficiency: Secondary | ICD-10-CM

## 2014-01-26 DIAGNOSIS — E538 Deficiency of other specified B group vitamins: Secondary | ICD-10-CM

## 2014-01-26 DIAGNOSIS — D509 Iron deficiency anemia, unspecified: Secondary | ICD-10-CM

## 2014-01-26 LAB — CBC WITH DIFFERENTIAL (CANCER CENTER ONLY)
BASO#: 0 10*3/uL (ref 0.0–0.2)
BASO%: 0 % (ref 0.0–2.0)
EOS%: 0 % (ref 0.0–7.0)
Eosinophils Absolute: 0 10*3/uL (ref 0.0–0.5)
HCT: 34.8 % (ref 34.8–46.6)
HGB: 11.2 g/dL — ABNORMAL LOW (ref 11.6–15.9)
LYMPH#: 2.5 10*3/uL (ref 0.9–3.3)
LYMPH%: 30.1 % (ref 14.0–48.0)
MCH: 27.9 pg (ref 26.0–34.0)
MCHC: 32.2 g/dL (ref 32.0–36.0)
MCV: 87 fL (ref 81–101)
MONO#: 0.6 10*3/uL (ref 0.1–0.9)
MONO%: 7 % (ref 0.0–13.0)
NEUT#: 5.1 10*3/uL (ref 1.5–6.5)
NEUT%: 62.9 % (ref 39.6–80.0)
PLATELETS: 338 10*3/uL (ref 145–400)
RBC: 4.01 10*6/uL (ref 3.70–5.32)
RDW: 13.4 % (ref 11.1–15.7)
WBC: 8.2 10*3/uL (ref 3.9–10.0)

## 2014-01-26 MED ORDER — CYANOCOBALAMIN 1000 MCG/ML IJ SOLN
1000.0000 ug | Freq: Once | INTRAMUSCULAR | Status: AC
  Administered 2014-01-26: 1000 ug via INTRAMUSCULAR

## 2014-01-26 MED ORDER — CYANOCOBALAMIN 1000 MCG/ML IJ SOLN
INTRAMUSCULAR | Status: AC
  Filled 2014-01-26: qty 1

## 2014-01-26 NOTE — Progress Notes (Signed)
This office note has been dictated.

## 2014-01-26 NOTE — Patient Instructions (Signed)
Cyanocobalamin, Vitamin B12 injection °What is this medicine? °CYANOCOBALAMIN (sye an oh koe BAL a min) is a man made form of vitamin B12. Vitamin B12 is used in the growth of healthy blood cells, nerve cells, and proteins in the body. It also helps with the metabolism of fats and carbohydrates. This medicine is used to treat people who can not absorb vitamin B12. °This medicine may be used for other purposes; ask your health care provider or pharmacist if you have questions. °COMMON BRAND NAME(S): Cyomin, LA-12 , Nutri-Twelve , Primabalt °What should I tell my health care provider before I take this medicine? °They need to know if you have any of these conditions: °-kidney disease °-Leber's disease °-megaloblastic anemia °-an unusual or allergic reaction to cyanocobalamin, cobalt, other medicines, foods, dyes, or preservatives °-pregnant or trying to get pregnant °-breast-feeding °How should I use this medicine? °This medicine is injected into a muscle or deeply under the skin. It is usually given by a health care professional in a clinic or doctor's office. However, your doctor may teach you how to inject yourself. Follow all instructions. °Talk to your pediatrician regarding the use of this medicine in children. Special care may be needed. °Overdosage: If you think you have taken too much of this medicine contact a poison control center or emergency room at once. °NOTE: This medicine is only for you. Do not share this medicine with others. °What if I miss a dose? °If you are given your dose at a clinic or doctor's office, call to reschedule your appointment. If you give your own injections and you miss a dose, take it as soon as you can. If it is almost time for your next dose, take only that dose. Do not take double or extra doses. °What may interact with this medicine? °-colchicine °-heavy alcohol intake °This list may not describe all possible interactions. Give your health care provider a list of all the  medicines, herbs, non-prescription drugs, or dietary supplements you use. Also tell them if you smoke, drink alcohol, or use illegal drugs. Some items may interact with your medicine. °What should I watch for while using this medicine? °Visit your doctor or health care professional regularly. You may need blood work done while you are taking this medicine. °You may need to follow a special diet. Talk to your doctor. Limit your alcohol intake and avoid smoking to get the best benefit. °What side effects may I notice from receiving this medicine? °Side effects that you should report to your doctor or health care professional as soon as possible: °-allergic reactions like skin rash, itching or hives, swelling of the face, lips, or tongue °-blue tint to skin °-chest tightness, pain °-difficulty breathing, wheezing °-dizziness °-red, swollen painful area on the leg °Side effects that usually do not require medical attention (report to your doctor or health care professional if they continue or are bothersome): °-diarrhea °-headache °This list may not describe all possible side effects. Call your doctor for medical advice about side effects. You may report side effects to FDA at 1-800-FDA-1088. °Where should I keep my medicine? °Keep out of the reach of children. °Store at room temperature between 15 and 30 degrees C (59 and 85 degrees F). Protect from light. Throw away any unused medicine after the expiration date. °NOTE: This sheet is a summary. It may not cover all possible information. If you have questions about this medicine, talk to your doctor, pharmacist, or health care provider. °© 2014, [employer]vier/[employer]. (2008-03-23   22:10:20) ° °

## 2014-01-27 NOTE — Progress Notes (Signed)
CC:   Sherri [name], M.D.  DIAGNOSES: 1. Pernicious anemia -- anti-intrinsic factor antibodies. 2. Intermittent iron-deficiency anemia. 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 4. 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  CURRENT THERAPY: 1. Vitamin B12, 1 mg IM q.3 months. 2. IV iron as indicated.  INTERIM HISTORY:  Sherri [name] comes in for a followup.  She is doing okay considering the problems that she has had.  She is on disability now.  We last saw her back in October.  At that point in time, her ferritin was 396 with an iron saturation of 41%.  Her B12 level was 49.  She has had no problems with bleeding.  She has had some fatigue.  She has had no nausea or vomiting.  There has been no change in bowel or bladder habits.  Of note, the last time we gave her iron was back in May.  She got Feraheme 510 mg.  She has had no leg swelling.  There have been no rashes.  She has had no cough or shortness of breath.  PHYSICAL EXAMINATION:  General:  This is a fairly well-developed, well- nourished African American female in no obvious distress.  Vital Signs: Temperature of 97.9, pulse 81, respiratory rate 14, blood pressure 117/72.  Weight is 158 pounds.  Head and Neck:  Shows no ocular or oral lesions.  She has no palpable cervical or supraclavicular lymph nodes. Lungs:  Clear bilaterally.  Cardiac:  Regular rate and rhythm with a normal S1, S2.  There are no murmurs, rubs, or bruits.  Abdomen:  Soft. She has good bowel sounds.  There is no fluid wave.  There is no palpable abdominal mass.  There is no palpable hepatosplenomegaly. Back:  No tenderness over the spine, ribs, or hips.  Extremities:  No clubbing, cyanosis, or edema.  Skin:  No rashes, ecchymosis or petechia. Neurological:  No focal neurological deficits.  LABORATORY STUDIES:  White cell count is 8.2, hemoglobin 11.2, hematocrit 34.8, platelet count 338.  MCV is 87.  IMPRESSION:  Sherri [name] is a very nice 48 year old  African American female.  She has multi-factorial hematologic issues.  We will go ahead and give her B12 shot today.  I will see what her iron studies show.  I am not sure why she would be so fatigued.  She is on quite a few medications.  We will go ahead and plan to get her back in 3 more months.  We will see what her iron studies show.    ______________________________ Sherri [name], M.D. PRE/MEDQ  D:  01/26/2014  T:  01/27/2014  Job:  [id number]

## 2014-01-29 LAB — TRANSFERRIN RECEPTOR, SOLUABLE: Transferrin Receptor, Soluble: 1.25 mg/L (ref 0.76–1.76)

## 2014-01-29 LAB — VITAMIN B12: Vitamin B-12: 525 pg/mL (ref 211–911)

## 2014-02-11 ENCOUNTER — Encounter: Payer: Self-pay | Admitting: *Deleted

## 2014-02-11 NOTE — Progress Notes (Signed)
Pt called asking for her Iron results.  Per Dr. Myna HidalgoEnnever, pt's Iron is normal.  Pt voiced understanding.

## 2014-03-05 LAB — PROCEDURE REPORT - SCANNED: Pap: NEGATIVE

## 2014-03-10 ENCOUNTER — Telehealth: Payer: Self-pay | Admitting: Neurology

## 2014-03-10 NOTE — Telephone Encounter (Signed)
Pt called in and stated that she is experiencing tingling and shaking in her hands like she is nervous. She is also having peaks of back pain but she has spoken with her orthopedist about that.  She asked if she should increase her meds or what she should do.  Please contact to discuss.  Thank you

## 2014-03-10 NOTE — Telephone Encounter (Signed)
The patient has not been seen through this office since 10/03/2012. We will need a revisit to discuss any alterations in medical therapy. The patient indicates that she is also having some issues with tremors, increased back pain. I will get a revisit for her.

## 2014-03-11 ENCOUNTER — Ambulatory Visit (INDEPENDENT_AMBULATORY_CARE_PROVIDER_SITE_OTHER): Payer: Federal, State, Local not specified - PPO | Admitting: Neurology

## 2014-03-11 ENCOUNTER — Encounter (INDEPENDENT_AMBULATORY_CARE_PROVIDER_SITE_OTHER): Payer: Self-pay

## 2014-03-11 ENCOUNTER — Encounter: Payer: Self-pay | Admitting: Neurology

## 2014-03-11 VITALS — BP 123/75 | HR 109 | Wt 165.0 lb

## 2014-03-11 DIAGNOSIS — R251 Tremor, unspecified: Secondary | ICD-10-CM

## 2014-03-11 DIAGNOSIS — G2582 Stiff-man syndrome: Secondary | ICD-10-CM

## 2014-03-11 DIAGNOSIS — G8929 Other chronic pain: Secondary | ICD-10-CM

## 2014-03-11 DIAGNOSIS — M62838 Other muscle spasm: Secondary | ICD-10-CM

## 2014-03-11 DIAGNOSIS — R259 Unspecified abnormal involuntary movements: Secondary | ICD-10-CM

## 2014-03-11 DIAGNOSIS — M549 Dorsalgia, unspecified: Secondary | ICD-10-CM

## 2014-03-11 NOTE — Progress Notes (Signed)
Reason for visit: Back pain  Sherri SlimmerSamantha [name] is an 48 y.o. female  History of present illness:  [name] is a 48 year old right-handed black female with a history of low back pain. The patient has had a history of severe spasms associated with rhabdomyolysis with muscle enzyme levels greater than 20,000 in 2013. The patient was found to have very elevated glutamic acid decarboxylase antibodies consistent with stiff person syndrome. The patient has been on baclofen, and diazepam with some benefit. The patient also takes gabapentin. The patient has had some recent worsening of stiffness of the back, although she denies any problems with neck or shoulder discomfort. The patient has injured her right shoulder, and she has had restriction of movement across his shoulder, and she is getting physical therapy for this. The patient has some stiffness sensations in the calf muscles bilaterally. The patient returns for an evaluation. The patient also reports some intermittent problems with tremors, but the tremors are not present at all times.  Past Medical History  Diagnosis Date  . Pernicious anemia 10/30/2011  . Lumbar herniated disc   . Environmental allergies   . Angioedema   . DVT (deep venous thrombosis)   . Asthma   . Seasonal allergies   . Urticaria   . Chronic back pain   . Anxiety   . Depression   . GERD (gastroesophageal reflux disease)   . Arthritis   . Chronic kidney disease   . Peripheral vascular disease   . CAP (community acquired pneumonia) 08/29/2012  . Stiff person syndrome   . Hiatal hernia 03/21/2013    Past Surgical History  Procedure Laterality Date  . Hernia repair    . Inguinal hernia 1983  1983    Family History  Problem Relation Age of Onset  . Cancer Mother   . Other Mother   . COPD Father   . Asthma Brother   . Cancer Brother     Social history:  reports that she has never smoked. She has never used smokeless tobacco. She reports that she drinks  alcohol. She reports that she does not use illicit drugs.    Allergies  Allergen Reactions  . Tylenol [Acetaminophen] Hives    Cannot take IBU either  . Amoxicillin Diarrhea  . Ibuprofen     Does not take due to hx of renal insufficiency    Medications:  Current Outpatient Prescriptions on File Prior to Visit  Medication Sig Dispense Refill  . albuterol (PROVENTIL HFA;VENTOLIN HFA) 108 (90 BASE) MCG/ACT inhaler Inhale into the lungs. ON HOLD D/T STUDY PT  IS ON      . baclofen (LIORESAL) 20 MG tablet 20 mg 3 (three) times daily.       . cyanocobalamin (,VITAMIN B-12,) 1000 MCG/ML injection Inject 1,000 mcg into the muscle every 3 (three) months.       . cyclobenzaprine (FLEXERIL) 10 MG tablet 10 mg at bedtime.       [name]. desloratadine (CLARINEX) 5 MG tablet Take 1 tablet (5 mg total) by mouth daily.  30 tablet  0  . diazepam (VALIUM) 10 MG tablet One tablet five times a day  450 tablet  1  . EPINEPHrine (EPI-PEN) 0.3 mg/0.3 mL DEVI Inject 0.3 mg into the muscle as needed. For life threatening allergic reaction      . Fluticasone-Salmeterol (ADVAIR) 250-50 MCG/DOSE AEPB Inhale into the lungs. ON HOLD D/P STUDY      . gabapentin (NEURONTIN) 300 MG capsule Take 300  mg by mouth 3 (three) times daily.       [name] levocetirizine (XYZAL) 5 MG tablet Take 1 tablet (5 mg total) by mouth every evening.  30 tablet  0  . montelukast (SINGULAIR) 10 MG tablet Take by mouth. ON HOLD D/T STUDY PT IS ON      . omeprazole (PRILOSEC) 40 MG capsule Take 40 mg by mouth daily.      . STUDY MEDICATION Inhale 2 puffs into the lungs 2 (two) times daily. Begins with D       No current facility-administered medications on file prior to visit.    ROS:  Out of a complete 14 system review of symptoms, the patient complains only of the following symptoms, and all other reviewed systems are negative.  Activity change, appetite change, fatigue, excessive sweating Shortness of breath Excessive thirst Sleep  talking Environmental allergies Back pain, achy muscles, muscle cramps Anemia Tremors  Blood pressure 123/75, pulse 109, weight 165 lb (74.844 kg).  Physical Exam  General: The patient is alert and cooperative at the time of the examination.  Neuromuscular: The patient has incomplete abduction of the right arm, able to elevate arm only 75. The patient has good range of movement of the low back.  Skin: No significant peripheral edema is noted.   Neurologic Exam  Mental status: The patient is oriented x 3.  Cranial nerves: Facial symmetry is present. Speech is normal, no aphasia or dysarthria is noted. Extraocular movements are full. Visual fields are full.  Motor: The patient has good strength in all 4 extremities.  Sensory examination: Soft touch sensation is symmetric on the face, arms, or legs.  Coordination: The patient has good finger-nose-finger and heel-to-shin bilaterally.  Gait and station: The patient has a normal gait. Tandem gait is normal. Romberg is negative. No drift is seen.  Reflexes: Deep tendon reflexes are symmetric.   MRI of the lumbar spine 09/07/12:  IMPRESSION:  1. No acute findings in the lumbar spine.  2. Chronic L4-L5 and L5-S1 disc degeneration and disc herniations  affecting the right lateral recess and right neural foramen at each  level.  3. Stable mild L4-L5 spinal stenosis, multifactorial.  4. Stable lumbar facet hypertrophy.    Assessment/Plan:  1. Chronic low back pain  2. 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 History of rhabdomyolysis  4. History of tremor  The patient will be sent for blood work for a thyroid profile and for a CK level. The patient will be maintained on diazepam and baclofen at this time. The patient will followup in 4 or 5 months. If the stiffness worsens with the back and legs, MRI evaluation of the lumbosacral spine will be repeated. If no changes are noted, the patient may be a candidate for IVIG for  treatment of presumed stiff person syndrome.  Marlan Palau MD 03/11/2014 7:01 PM  [facility] [address] [place], [place] [address]  Phone [phone] Fax [phone]

## 2014-03-11 NOTE — Patient Instructions (Signed)
Back Pain, Adult Low back pain is very common. About 1 in 5 people have back pain.The cause of low back pain is rarely dangerous. The pain often gets better over time.About half of people with a sudden onset of back pain feel better in just 2 weeks. About 8 in 10 people feel better by 6 weeks.  CAUSES Some common causes of back pain include:  Strain of the muscles or ligaments supporting the spine.  Wear and tear (degeneration) of the spinal discs.  Arthritis.  Direct injury to the back. DIAGNOSIS Most of the time, the direct cause of low back pain is not known.However, back pain can be treated effectively even when the exact cause of the pain is unknown.Answering your caregiver's questions about your overall health and symptoms is one of the most accurate ways to make sure the cause of your pain is not dangerous. If your caregiver needs more information, he or she may order lab work or imaging tests (X-rays or MRIs).However, even if imaging tests show changes in your back, this usually does not require surgery. HOME CARE INSTRUCTIONS For many people, back pain returns.Since low back pain is rarely dangerous, it is often a condition that people can learn to manageon their own.   Remain active. It is stressful on the back to sit or stand in one place. Do not sit, drive, or stand in one place for more than 30 minutes at a time. Take short walks on level surfaces as soon as pain allows.Try to increase the length of time you walk each day.  Do not stay in bed.Resting more than 1 or 2 days can delay your recovery.  Do not avoid exercise or work.Your body is made to move.It is not dangerous to be active, even though your back may hurt.Your back will likely heal faster if you return to being active before your pain is gone.  Pay attention to your body when you bend and lift. Many people have less discomfortwhen lifting if they bend their knees, keep the load close to their bodies,and  avoid twisting. Often, the most comfortable positions are those that put less stress on your recovering back.  Find a comfortable position to sleep. Use a firm mattress and lie on your side with your knees slightly bent. If you lie on your back, put a pillow under your knees.  Only take over-the-counter or prescription medicines as directed by your caregiver. Over-the-counter medicines to reduce pain and inflammation are often the most helpful.Your caregiver may prescribe muscle relaxant drugs.These medicines help dull your pain so you can more quickly return to your normal activities and healthy exercise.  Put ice on the injured area.  Put ice in a plastic bag.  Place a towel between your skin and the bag.  Leave the ice on for 15-20 minutes, 03-04 times a day for the first 2 to 3 days. After that, ice and heat may be alternated to reduce pain and spasms.  Ask your caregiver about trying back exercises and gentle massage. This may be of some benefit.  Avoid feeling anxious or stressed.Stress increases muscle tension and can worsen back pain.It is important to recognize when you are anxious or stressed and learn ways to manage it.Exercise is a great option. SEEK MEDICAL CARE IF:  You have pain that is not relieved with rest or medicine.  You have pain that does not improve in 1 week.  You have new symptoms.  You are generally not feeling well. SEEK   IMMEDIATE MEDICAL CARE IF:   You have pain that radiates from your back into your legs.  You develop new bowel or bladder control problems.  You have unusual weakness or numbness in your arms or legs.  You develop nausea or vomiting.  You develop abdominal pain.  You feel faint. Document Released: 12/11/2005 Document Revised: 06/11/2012 Document Reviewed: 05/01/2011 Exit[employer] Patient Information 2014 [employer].  

## 2014-03-18 ENCOUNTER — Other Ambulatory Visit (INDEPENDENT_AMBULATORY_CARE_PROVIDER_SITE_OTHER): Payer: Federal, State, Local not specified - PPO

## 2014-03-18 DIAGNOSIS — Z0289 Encounter for other administrative examinations: Secondary | ICD-10-CM

## 2014-03-19 LAB — CK: Total CK: 91 U/L (ref 24–173)

## 2014-03-19 LAB — TSH: TSH: 0.64 u[IU]/mL (ref 0.450–4.500)

## 2014-04-02 ENCOUNTER — Other Ambulatory Visit: Payer: Self-pay | Admitting: Neurology

## 2014-04-02 MED ORDER — DIAZEPAM 10 MG PO TABS: ORAL_TABLET | ORAL | Status: DC

## 2014-04-02 NOTE — Telephone Encounter (Signed)
Pt called in and stated she needs a refill on her diazepam (VALIUM) 10 MG tablet.  She states that it has to go [employer] [employer] [facility].  She stated that the cover sheet for the refill has to have the following information:  Patient name, date of birth, phone number, ID# [id number] [facility] and it needs to be faxed to [phone].  Please call patient at [phone] if there are any questions or if any additional information is needed.  Thank you.

## 2014-04-02 NOTE — Telephone Encounter (Signed)
Rx signed and faxed.

## 2014-04-27 ENCOUNTER — Other Ambulatory Visit: Payer: Self-pay | Admitting: Lab

## 2014-04-27 ENCOUNTER — Ambulatory Visit: Payer: Self-pay | Admitting: Hematology & Oncology

## 2014-04-27 ENCOUNTER — Telehealth: Payer: Self-pay | Admitting: Hematology & Oncology

## 2014-04-27 ENCOUNTER — Ambulatory Visit: Payer: Self-pay

## 2014-04-27 NOTE — Telephone Encounter (Signed)
Left message to call and reschedule no show

## 2014-04-29 ENCOUNTER — Telehealth: Payer: Self-pay | Admitting: Hematology & Oncology

## 2014-04-29 NOTE — Telephone Encounter (Signed)
Pt moved 5-10 to 6-9. I offered to have her leave message on RN voice mail to see if she should come in for lab and inj only but she didn't want to do that.

## 2014-06-02 ENCOUNTER — Ambulatory Visit (HOSPITAL_BASED_OUTPATIENT_CLINIC_OR_DEPARTMENT_OTHER): Admitting: Hematology & Oncology

## 2014-06-02 ENCOUNTER — Other Ambulatory Visit (HOSPITAL_BASED_OUTPATIENT_CLINIC_OR_DEPARTMENT_OTHER): Payer: Federal, State, Local not specified - PPO | Admitting: Lab

## 2014-06-02 ENCOUNTER — Ambulatory Visit (HOSPITAL_BASED_OUTPATIENT_CLINIC_OR_DEPARTMENT_OTHER): Payer: Federal, State, Local not specified - PPO

## 2014-06-02 ENCOUNTER — Encounter: Payer: Self-pay | Admitting: Hematology & Oncology

## 2014-06-02 VITALS — BP 107/69 | HR 89 | Temp 98.1°F | Resp 14 | Ht 61.0 in | Wt 160.0 lb

## 2014-06-02 DIAGNOSIS — G2582 Stiff-man syndrome: Secondary | ICD-10-CM

## 2014-06-02 DIAGNOSIS — D509 Iron deficiency anemia, unspecified: Secondary | ICD-10-CM

## 2014-06-02 DIAGNOSIS — D510 Vitamin B12 deficiency anemia due to intrinsic factor deficiency: Secondary | ICD-10-CM

## 2014-06-02 DIAGNOSIS — Z86718 Personal history of other venous thrombosis and embolism: Secondary | ICD-10-CM

## 2014-06-02 LAB — CBC WITH DIFFERENTIAL (CANCER CENTER ONLY)
BASO#: 0 10*3/uL (ref 0.0–0.2)
BASO%: 0 % (ref 0.0–2.0)
EOS ABS: 0 10*3/uL (ref 0.0–0.5)
EOS%: 0 % (ref 0.0–7.0)
HEMATOCRIT: 36.4 % (ref 34.8–46.6)
HGB: 12 g/dL (ref 11.6–15.9)
LYMPH#: 2.4 10*3/uL (ref 0.9–3.3)
LYMPH%: 29 % (ref 14.0–48.0)
MCH: 28.9 pg (ref 26.0–34.0)
MCHC: 33 g/dL (ref 32.0–36.0)
MCV: 88 fL (ref 81–101)
MONO#: 0.6 10*3/uL (ref 0.1–0.9)
MONO%: 7.2 % (ref 0.0–13.0)
NEUT%: 63.8 % (ref 39.6–80.0)
NEUTROS ABS: 5.3 10*3/uL (ref 1.5–6.5)
Platelets: 306 10*3/uL (ref 145–400)
RBC: 4.15 10*6/uL (ref 3.70–5.32)
RDW: 13.3 % (ref 11.1–15.7)
WBC: 8.2 10*3/uL (ref 3.9–10.0)

## 2014-06-02 LAB — RETICULOCYTES (CHCC)
ABS Retic: 79 10*3/uL (ref 19.0–186.0)
RBC.: 4.16 MIL/uL (ref 3.87–5.11)
RETIC CT PCT: 1.9 % (ref 0.4–2.3)

## 2014-06-02 LAB — CHCC SATELLITE - SMEAR

## 2014-06-02 LAB — FERRITIN CHCC: Ferritin: 294 ng/ml — ABNORMAL HIGH (ref 9–269)

## 2014-06-02 LAB — IRON AND TIBC CHCC
%SAT: 47 % (ref 21–57)
Iron: 109 ug/dL (ref 41–142)
TIBC: 231 ug/dL — AB (ref 236–444)
UIBC: 122 ug/dL (ref 120–384)

## 2014-06-02 LAB — VITAMIN B12: Vitamin B-12: 304 pg/mL (ref 211–911)

## 2014-06-02 MED ORDER — CYANOCOBALAMIN 1000 MCG/ML IJ SOLN
1000.0000 ug | Freq: Once | INTRAMUSCULAR | Status: AC
  Administered 2014-06-02: 1000 ug via INTRAMUSCULAR

## 2014-06-02 MED ORDER — CYANOCOBALAMIN 1000 MCG/ML IJ SOLN
INTRAMUSCULAR | Status: AC
  Filled 2014-06-02: qty 1

## 2014-06-02 NOTE — Patient Instructions (Signed)
Vitamin B12 Injections Every person needs vitamin B12. A deficiency develops when the body does not get enough of it. One way to overcome this is by getting B12 shots (injections). A B12 shot puts the vitamin directly into muscle tissue. This avoids any problems your body might have in absorbing it from food or a pill. In some people, the body has trouble using the vitamin correctly. This can cause a B12 deficiency. Not consuming enough of the vitamin can also cause a deficiency. Getting enough vitamin B12 can be hard for elderly people. Sometimes, they do not eat a well-balanced diet. The elderly are also more likely than younger people to have medical conditions or take medications that can lead to a deficiency. WHAT DOES VITAMIN B12 DO? Vitamin B12 does many things to help the body work right:  It helps the body make healthy red blood cells.  It helps maintain nerve cells.  It is involved in the body's process of converting food into energy (metabolism).  It is needed to make the genetic material in all cells (DNA). VITAMIN B12 FOOD SOURCES Most people get plenty of vitamin B12 through the foods they eat. It is present in:  Meat, fish, poultry, and eggs.  Milk and milk products.  It also is added when certain foods are made, including some breads, cereals and yogurts. The food is then called "fortified". CAUSES The most common causes of vitamin B12 deficiency are:  Pernicious anemia. The condition develops when the body cannot make enough healthy red blood cells. This stems from a lack of a protein made in the stomach (intrinsic factor). People without this protein cannot absorb enough vitamin B12 from food.  Malabsorption. This is when the body cannot absorb the vitamin. It can be caused by:  Pernicious anemia.  Surgery to remove part or all of the stomach can lead to malabsorption. Removal of part or all of the small intestine can also cause malabsorption.  Vegetarian diet.  People who are strict about not eating foods from animals could have trouble taking in enough vitamin B12 from diet alone.  Medications. Some medicines have been linked to B12 deficiency, such as Metformin (a drug prescribed for type 2 diabetes). Long-term use of stomach acid suppressants also can keep the vitamin from being absorbed.  Intestinal problems such as inflammatory bowel disease. If there are problems in the digestive tract, vitamin B12 may not be absorbed in good enough amounts. SYMPTOMS People who do not get enough B12 can develop problems. These can include:  Anemia. This is when the body has too few red blood cells. Red blood cells carry oxygen to the rest of the body. Without a healthy supply of red blood cells, people can feel:  Tired (fatigued).  Weak.  Severe anemia can cause:  Shortness of breath.  Dizziness.  Rapid heart rate.  Paleness.  Other Vitamin B12 deficiency symptoms include:  Diarrhea.  Numbness or tingling in the hands or feet.  Loss of appetite.  Confusion.  Sores on the tongue or in the mouth. LET YOUR CAREGIVER KNOW ABOUT:  Any allergies. It is very important to know if you are allergic or sensitive to cobalt. Vitamin B12 contains cobalt.  Any history of kidney disease.  All medications you are taking. Include prescription and over-the-counter medicines, herbs and creams.  Whether you are pregnant or breast-feeding.  If you have Leber's disease, a hereditary eye condition, vitamin B12 could make it worse. RISKS AND COMPLICATIONS Reactions to an injection are   usually temporary. They might include:  Pain at the injection site.  Redness, swelling or tenderness at the site.  Headache, dizziness or weakness.  Nausea, upset stomach or diarrhea.  Numbness or tingling.  Fever.  Joint pain.  Itching or rash. If a reaction does not go away in a short while, talk with your healthcare provider. A change in the way the shots are  given, or where they are given, might need to be made. BEFORE AN INJECTION To decide whether B12 injections are right for you, your healthcare provider will probably:  Ask about your medical history.  Ask questions about your diet.  Ask about symptoms such as:  Have you felt weak?  Do you feel unusually tired?  Do you get dizzy?  Order blood tests. These may include a test to:  Check the level of red cells in your blood.  Measure B12 levels.  Check for the presence of intrinsic factor. VITAMIN B12 INJECTIONS How often you will need a vitamin B12 injection will depend on how severe your deficiency is. This also will affect how long you will need to get them. People with pernicious anemia usually get injections for their entire life. Others might get them for a shorter period. For many people, injections are given daily or weekly for several weeks. Then, once B12 levels are normal, injections are given just once a month. If the cause of the deficiency can be fixed, the injections can be stopped. Talk with your healthcare provider about what you should expect. For an injection:  The injection site will be cleaned with an alcohol swab.  Your healthcare provider will insert a needle directly into a muscle. Most any muscle can be used. Most often, an arm muscle is used. A buttocks muscle can also be used. Many people say shots in that area are less painful.  A small adhesive bandage may be put over the injection site. It usually can be taken off in an hour or less. Injections can be given by your healthcare provider. In some cases, family members give them. Sometimes, people give them to themselves. Talk with your healthcare provider about what would be best for you. If someone other than your healthcare provider will be giving the shots, the person will need to be trained to give them correctly. HOME CARE INSTRUCTIONS   You can remove the adhesive bandage within an hour of getting a  shot.  You should be able to go about your normal activities right away.  Avoid drinking large amounts of alcohol while taking vitamin B12 shots. Alcohol can interfere with the body's use of the vitamin. SEEK MEDICAL CARE IF:   Pain, redness, swelling or tenderness at the injection site does not get better or gets worse.  Headache, dizziness or weakness does not go away.  You develop a fever of more than 100.5 F (38.1 C). SEEK IMMEDIATE MEDICAL CARE IF:   You have chest pain.  You develop shortness of breath.  You have muscle weakness that gets worse.  You develop numbness, weakness or tingling on one side or one area of the body.  You have symptoms of an allergic reaction, such as:  Hives.  Difficulty breathing.  Swelling of the lips, face, tongue or throat.  You develop a fever of more than 102.0 F (38.9 C). MAKE SURE YOU:   Understand these instructions.  Will watch your condition.  Will get help right away if you are not doing well or get worse. Document   Released: 03/09/2009 Document Revised: 03/04/2012 Document Reviewed: 03/09/2009 Exit[employer] Patient Information 2014 [employer].  
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
## 2014-06-03 ENCOUNTER — Telehealth: Payer: Self-pay | Admitting: Hematology & Oncology

## 2014-06-03 NOTE — Telephone Encounter (Signed)
Left pt message with 09-09-14

## 2014-06-24 ENCOUNTER — Ambulatory Visit: Payer: Self-pay | Admitting: Internal Medicine

## 2014-06-25 ENCOUNTER — Ambulatory Visit (INDEPENDENT_AMBULATORY_CARE_PROVIDER_SITE_OTHER): Payer: Federal, State, Local not specified - PPO | Admitting: Internal Medicine

## 2014-06-25 ENCOUNTER — Encounter: Payer: Self-pay | Admitting: Internal Medicine

## 2014-06-25 VITALS — BP 114/68 | HR 82 | Ht 63.0 in | Wt 159.8 lb

## 2014-06-25 DIAGNOSIS — I80299 Phlebitis and thrombophlebitis of other deep vessels of unspecified lower extremity: Secondary | ICD-10-CM

## 2014-06-25 DIAGNOSIS — J45909 Unspecified asthma, uncomplicated: Secondary | ICD-10-CM

## 2014-06-25 DIAGNOSIS — J4520 Mild intermittent asthma, uncomplicated: Secondary | ICD-10-CM

## 2014-06-25 NOTE — Assessment & Plan Note (Signed)
Her primary physician supplies a rescue inhaler used occasionally before walking

## 2014-06-25 NOTE — Assessment & Plan Note (Signed)
No recurrence reported
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
## 2014-06-25 NOTE — Patient Instructions (Signed)
Please call us if needed

## 2014-07-18 ENCOUNTER — Other Ambulatory Visit: Payer: Self-pay | Admitting: Internal Medicine

## 2014-07-20 ENCOUNTER — Encounter: Payer: Self-pay | Admitting: Neurology

## 2014-07-20 ENCOUNTER — Ambulatory Visit (INDEPENDENT_AMBULATORY_CARE_PROVIDER_SITE_OTHER): Payer: Federal, State, Local not specified - PPO | Admitting: Neurology

## 2014-07-20 VITALS — BP 121/77 | HR 89 | Wt 157.0 lb

## 2014-07-20 DIAGNOSIS — G2582 Stiff-man syndrome: Secondary | ICD-10-CM

## 2014-07-20 NOTE — Progress Notes (Signed)
Reason for visit: Stiff person syndrome  Sherri [name] is an 48 y.o. female  History of present illness:  Sherri [name] is a 48 year old right-handed black female with a history of stiff person syndrome, who is doing well on diazepam and baclofen. She also takes Flexeril 10 mg at night if needed. The patient denies any significant problems with muscle spasms at this point. She will occasionally use a cane when out of the house. She has had one fall since last seen. She does have some discomfort in the low back at times. The patient returns to this office for an evaluation.   Past Medical History  Diagnosis Date  . Pernicious anemia 10/30/2011  . Lumbar herniated disc   . Environmental allergies   . Angioedema   . DVT (deep venous thrombosis)   . Asthma   . Seasonal allergies   . Urticaria   . Chronic back pain   . Anxiety   . Depression   . GERD (gastroesophageal reflux disease)   . Arthritis   . Chronic kidney disease   . Peripheral vascular disease   . CAP (community acquired pneumonia) 08/29/2012  . Stiff person syndrome   . Hiatal hernia 03/21/2013    Past Surgical History  Procedure Laterality Date  . Hernia repair    . Inguinal hernia 1983  1983    Family History  Problem Relation Age of Onset  . Cancer Mother   . Other Mother   . COPD Father   . Asthma Brother   . Cancer Brother     colon  . Heart attack Brother   . Seizures Brother     Social history:  reports that she has never smoked. She has never used smokeless tobacco. She reports that she drinks alcohol. She reports that she does not use illicit drugs.    Allergies  Allergen Reactions  . Tylenol [Acetaminophen] Hives    Cannot take IBU either  . Amoxicillin Diarrhea  . Ibuprofen     Does not take due to hx of renal insufficiency    Medications:  Current Outpatient Prescriptions on File Prior to Visit  Medication Sig Dispense Refill  . albuterol (PROVENTIL HFA;VENTOLIN HFA) 108 (90 BASE)  MCG/ACT inhaler Inhale 1 puff into the lungs.       . baclofen (LIORESAL) 20 MG tablet 20 mg 3 (three) times daily.       . cyanocobalamin (,VITAMIN B-12,) 1000 MCG/ML injection Inject 1,000 mcg into the muscle every 3 (three) months.       . cyclobenzaprine (FLEXERIL) 10 MG tablet 10 mg at bedtime.       [name] desloratadine (CLARINEX) 5 MG tablet Take 1 tablet (5 mg total) by mouth daily.  30 tablet  0  . diazepam (VALIUM) 10 MG tablet One tablet five times a day  450 tablet  1  . EPINEPHrine (EPI-PEN) 0.3 mg/0.3 mL DEVI Inject 0.3 mg into the muscle as needed. For life threatening allergic reaction      . Fluticasone-Salmeterol (ADVAIR) 250-50 MCG/DOSE AEPB Inhale into the lungs. ON HOLD D/P STUDY      . gabapentin (NEURONTIN) 300 MG capsule Take 300 mg by mouth 3 (three) times daily.       [name] levocetirizine (XYZAL) 5 MG tablet Take 1 tablet (5 mg total) by mouth every evening.  30 tablet  0  . montelukast (SINGULAIR) 10 MG tablet Take by mouth.       [name] omeprazole (PRILOSEC) 40 MG  capsule Take 40 mg by mouth daily.      [name]. EPIPEN 2-PAK 0.3 MG/0.3ML SOAJ injection USE AS DIRECTED FOR  SEVERE  ALLERGIC  REACTIONS  1 Device  11   No current facility-administered medications on file prior to visit.    ROS:  Out of a complete 14 system review of symptoms, the patient complains only of the following symptoms, and all other reviewed systems are negative.  Appetite change, fatigue Drooling while sleeping Environmental allergies Low back pain Anemia  Depression   Blood pressure 121/77, pulse 89, weight 157 lb (71.215 kg).  Physical Exam  General: The patient is alert and cooperative at the time of the examination.  Neuromuscular: The patient is able to abduct the right arm to about 140 degrees.  Skin: No significant peripheral edema is noted.   Neurologic Exam  Mental status: The patient is oriented x 3.  Cranial nerves: Facial symmetry is present. Speech is normal, no aphasia or  dysarthria is noted. Extraocular movements are full. Visual fields are full.  Motor: The patient has good strength in all 4 extremities.  Sensory examination: Soft touch sensation is symmetric on the face, arms, and legs.   Coordination: The patient has good finger-nose-finger and heel-to-shin bilaterally.  Gait and station: The patient has a normal gait. Tandem gait is normal. Romberg is negative. No drift is seen.  Reflexes: Deep tendon reflexes are symmetric.   Assessment/Plan:  One.  Stiff person syndrome  The patient seems to be doing quite well on diazepam and baclofen. We will continue the medications for now. She will followup in about 9 months. The patient appears to have minimal physical deficits at this time.   Marlan Palau. Keith Willis MD 07/20/2014 7:18 PM  [facility] [address] [place], [place] [id number]  Phone [phone] Fax [phone]

## 2014-07-20 NOTE — Patient Instructions (Signed)
Back Pain, Adult Low back pain is very common. About 1 in 5 people have back pain.The cause of low back pain is rarely dangerous. The pain often gets better over time.About half of people with a sudden onset of back pain feel better in just 2 weeks. About 8 in 10 people feel better by 6 weeks.  CAUSES Some common causes of back pain include:  Strain of the muscles or ligaments supporting the spine.  Wear and tear (degeneration) of the spinal discs.  Arthritis.  Direct injury to the back. DIAGNOSIS Most of the time, the direct cause of low back pain is not known.However, back pain can be treated effectively even when the exact cause of the pain is unknown.Answering your caregiver's questions about your overall health and symptoms is one of the most accurate ways to make sure the cause of your pain is not dangerous. If your caregiver needs more information, he or she may order lab work or imaging tests (X-rays or MRIs).However, even if imaging tests show changes in your back, this usually does not require surgery. HOME CARE INSTRUCTIONS For many people, back pain returns.Since low back pain is rarely dangerous, it is often a condition that people can learn to manageon their own.   Remain active. It is stressful on the back to sit or stand in one place. Do not sit, drive, or stand in one place for more than 30 minutes at a time. Take short walks on level surfaces as soon as pain allows.Try to increase the length of time you walk each day.  Do not stay in bed.Resting more than 1 or 2 days can delay your recovery.  Do not avoid exercise or work.Your body is made to move.It is not dangerous to be active, even though your back may hurt.Your back will likely heal faster if you return to being active before your pain is gone.  Pay attention to your body when you bend and lift. Many people have less discomfortwhen lifting if they bend their knees, keep the load close to their bodies,and  avoid twisting. Often, the most comfortable positions are those that put less stress on your recovering back.  Find a comfortable position to sleep. Use a firm mattress and lie on your side with your knees slightly bent. If you lie on your back, put a pillow under your knees.  Only take over-the-counter or prescription medicines as directed by your caregiver. Over-the-counter medicines to reduce pain and inflammation are often the most helpful.Your caregiver may prescribe muscle relaxant drugs.These medicines help dull your pain so you can more quickly return to your normal activities and healthy exercise.  Put ice on the injured area.  Put ice in a plastic bag.  Place a towel between your skin and the bag.  Leave the ice on for 15-20 minutes, 03-04 times a day for the first 2 to 3 days. After that, ice and heat may be alternated to reduce pain and spasms.  Ask your caregiver about trying back exercises and gentle massage. This may be of some benefit.  Avoid feeling anxious or stressed.Stress increases muscle tension and can worsen back pain.It is important to recognize when you are anxious or stressed and learn ways to manage it.Exercise is a great option. SEEK MEDICAL CARE IF:  You have pain that is not relieved with rest or medicine.  You have pain that does not improve in 1 week.  You have new symptoms.  You are generally not feeling well. SEEK   IMMEDIATE MEDICAL CARE IF:   You have pain that radiates from your back into your legs.  You develop new bowel or bladder control problems.  You have unusual weakness or numbness in your arms or legs.  You develop nausea or vomiting.  You develop abdominal pain.  You feel faint. Document Released: 12/11/2005 Document Revised: 06/11/2012 Document Reviewed: 04/14/2014 ExitCare Patient Information 2015 [employer]. This information is not intended to replace advice given to you by your health care provider. Make sure you  discuss any questions you have with your health care provider.  

## 2014-09-09 ENCOUNTER — Other Ambulatory Visit (HOSPITAL_BASED_OUTPATIENT_CLINIC_OR_DEPARTMENT_OTHER): Payer: Federal, State, Local not specified - PPO | Admitting: Lab

## 2014-09-09 ENCOUNTER — Ambulatory Visit (HOSPITAL_BASED_OUTPATIENT_CLINIC_OR_DEPARTMENT_OTHER): Payer: Federal, State, Local not specified - PPO | Admitting: Family

## 2014-09-09 ENCOUNTER — Other Ambulatory Visit: Payer: Self-pay | Admitting: Family

## 2014-09-09 ENCOUNTER — Ambulatory Visit (HOSPITAL_BASED_OUTPATIENT_CLINIC_OR_DEPARTMENT_OTHER): Payer: Federal, State, Local not specified - PPO

## 2014-09-09 ENCOUNTER — Encounter: Payer: Self-pay | Admitting: Family

## 2014-09-09 VITALS — BP 112/79 | HR 87 | Temp 98.1°F | Resp 20 | Ht 62.0 in | Wt 152.0 lb

## 2014-09-09 DIAGNOSIS — E538 Deficiency of other specified B group vitamins: Secondary | ICD-10-CM

## 2014-09-09 DIAGNOSIS — Z86718 Personal history of other venous thrombosis and embolism: Secondary | ICD-10-CM

## 2014-09-09 DIAGNOSIS — D509 Iron deficiency anemia, unspecified: Secondary | ICD-10-CM

## 2014-09-09 DIAGNOSIS — D510 Vitamin B12 deficiency anemia due to intrinsic factor deficiency: Secondary | ICD-10-CM

## 2014-09-09 DIAGNOSIS — J069 Acute upper respiratory infection, unspecified: Secondary | ICD-10-CM

## 2014-09-09 DIAGNOSIS — M7500 Adhesive capsulitis of unspecified shoulder: Secondary | ICD-10-CM

## 2014-09-09 DIAGNOSIS — J45909 Unspecified asthma, uncomplicated: Secondary | ICD-10-CM

## 2014-09-09 DIAGNOSIS — G2582 Stiff-man syndrome: Secondary | ICD-10-CM

## 2014-09-09 DIAGNOSIS — I80299 Phlebitis and thrombophlebitis of other deep vessels of unspecified lower extremity: Secondary | ICD-10-CM

## 2014-09-09 LAB — COMPREHENSIVE METABOLIC PANEL
ALT: 11 U/L (ref 0–35)
AST: 13 U/L (ref 0–37)
Albumin: 4 g/dL (ref 3.5–5.2)
Alkaline Phosphatase: 78 U/L (ref 39–117)
BUN: 11 mg/dL (ref 6–23)
CALCIUM: 9.6 mg/dL (ref 8.4–10.5)
CHLORIDE: 105 meq/L (ref 96–112)
CO2: 29 meq/L (ref 19–32)
CREATININE: 1.14 mg/dL — AB (ref 0.50–1.10)
GLUCOSE: 101 mg/dL — AB (ref 70–99)
POTASSIUM: 3.8 meq/L (ref 3.5–5.3)
Sodium: 141 mEq/L (ref 135–145)
Total Bilirubin: 0.3 mg/dL (ref 0.2–1.2)
Total Protein: 6.9 g/dL (ref 6.0–8.3)

## 2014-09-09 LAB — IRON AND TIBC CHCC
%SAT: 26 % (ref 21–57)
Iron: 61 ug/dL (ref 41–142)
TIBC: 231 ug/dL — AB (ref 236–444)
UIBC: 170 ug/dL (ref 120–384)

## 2014-09-09 LAB — CBC WITH DIFFERENTIAL (CANCER CENTER ONLY)
BASO#: 0 10*3/uL (ref 0.0–0.2)
BASO%: 0.1 % (ref 0.0–2.0)
EOS ABS: 0 10*3/uL (ref 0.0–0.5)
EOS%: 0 % (ref 0.0–7.0)
HCT: 38.6 % (ref 34.8–46.6)
HEMOGLOBIN: 12.8 g/dL (ref 11.6–15.9)
LYMPH#: 1.6 10*3/uL (ref 0.9–3.3)
LYMPH%: 23 % (ref 14.0–48.0)
MCH: 28.3 pg (ref 26.0–34.0)
MCHC: 33.2 g/dL (ref 32.0–36.0)
MCV: 85 fL (ref 81–101)
MONO#: 0.4 10*3/uL (ref 0.1–0.9)
MONO%: 5.3 % (ref 0.0–13.0)
NEUT#: 5 10*3/uL (ref 1.5–6.5)
NEUT%: 71.6 % (ref 39.6–80.0)
Platelets: 292 10*3/uL (ref 145–400)
RBC: 4.52 10*6/uL (ref 3.70–5.32)
RDW: 12.5 % (ref 11.1–15.7)
WBC: 6.9 10*3/uL (ref 3.9–10.0)

## 2014-09-09 LAB — FERRITIN CHCC: FERRITIN: 407 ng/mL — AB (ref 9–269)

## 2014-09-09 MED ORDER — CYANOCOBALAMIN 1000 MCG/ML IJ SOLN
INTRAMUSCULAR | Status: AC
  Filled 2014-09-09: qty 1

## 2014-09-09 MED ORDER — CYANOCOBALAMIN 1000 MCG/ML IJ SOLN
1000.0000 ug | Freq: Once | INTRAMUSCULAR | Status: AC
  Administered 2014-09-09: 1000 ug via INTRAMUSCULAR

## 2014-09-09 NOTE — Progress Notes (Signed)
[facility]  [phone] [phone]  ID: Sherri [name] OB: Oct 13, 1966 MR#: [id number] HYQ#:[id number] Patient Care Team: Dorothyann Peng, MD as PCP - General  DIAGNOSIS: 1. Pernicious anemia -- anti-intrinsic factor antibodies.  2. Intermittent iron-deficiency anemia.  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  4. 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  INTERVAL HISTORY: Sherri [name] is here for a follow-up today. Last saw her back in February. She has been doing okay. She is under a lot of stress at home and is planning to go back to see her psychiatrist. She wants to go back on her Lexapro which she has been off of for a year or two. She is still having issues with her back and is taking flexeril and valium for this. She has had no bleeding. Her appetite is good and she is drinking plenty of fluids. She denies fever, chills, n/v, cough, rash, headache, dizziness, SOB, chest pain, palpitations, abdominal pain, constipation, diarrhea, blood in urine or stool. She has and no swelling, tenderness, numbness or tingling in her extremities. In February her transferrin receptor of 1.25 which is normal. In June her B12 was 304. Today her ferritin was 407 and iron saturation was 26%.   CURRENT TREATMENT: 1. Vitamin B12, 1 mg IM q.3 months.  2. IV iron as indicated.  REVIEW OF SYSTEMS: All other 10 point review of systems is negative.   PAST MEDICAL HISTORY: Past Medical History  Diagnosis Date  . Pernicious anemia 10/30/2011  . Lumbar herniated disc   . Environmental allergies   . Angioedema   . DVT (deep venous thrombosis)   . Asthma   . Seasonal allergies   . Urticaria   . Chronic back pain   . Anxiety   . Depression   . GERD (gastroesophageal reflux disease)   . Arthritis   . Chronic kidney disease   . Peripheral vascular disease   . CAP (community acquired pneumonia) 08/29/2012  . Stiff person syndrome   . Hiatal hernia 03/21/2013   PAST SURGICAL  HISTORY: Past Surgical History  Procedure Laterality Date  . Hernia repair    . Inguinal hernia 1983  1983   FAMILY HISTORY Family History  Problem Relation Age of Onset  . Cancer Mother   . Other Mother   . COPD Father   . Asthma Brother   . Cancer Brother     colon  . Heart attack Brother   . Seizures Brother    GYNECOLOGIC HISTORY:  No LMP recorded. Patient is not currently having periods (Reason: Perimenopausal).   SOCIAL HISTORY:  History   Social History Narrative  . No narrative on file   ADVANCED DIRECTIVES: <no information>  HEALTH MAINTENANCE: History  Substance Use Topics  . Smoking status: Never Smoker   . Smokeless tobacco: Never Used     Comment: never used product  . Alcohol Use: Yes     Comment: Occasional   Colonoscopy: PAP: Bone density: Lipid panel:  Allergies  Allergen Reactions  . Tylenol [Acetaminophen] Hives    Cannot take IBU either  . Amoxicillin Diarrhea  . Ibuprofen     Does not take due to hx of renal insufficiency   Current Outpatient Prescriptions  Medication Sig Dispense Refill  . albuterol (PROVENTIL HFA;VENTOLIN HFA) 108 (90 BASE) MCG/ACT inhaler Inhale 1 puff into the lungs.       . baclofen (LIORESAL) 20 MG tablet 20 mg 3 (three) times daily.       [name]  cholecalciferol (VITAMIN D) 1000 UNITS tablet Take 1,000 Units by mouth daily.      . cyanocobalamin (,VITAMIN B-12,) 1000 MCG/ML injection Inject 1,000 mcg into the muscle every 3 (three) months.       . cyclobenzaprine (FLEXERIL) 10 MG tablet 10 mg at bedtime.       [name] desloratadine (CLARINEX) 5 MG tablet Take 1 tablet (5 mg total) by mouth daily.  30 tablet  0  . diazepam (VALIUM) 10 MG tablet One tablet five times a day  450 tablet  1  . EPINEPHrine (EPI-PEN) 0.3 mg/0.3 mL DEVI Inject 0.3 mg into the muscle as needed. For life threatening allergic reaction      . EPIPEN 2-PAK 0.3 MG/0.3ML SOAJ injection USE AS DIRECTED FOR  SEVERE  ALLERGIC  REACTIONS  1 Device  11  .  Fluticasone-Salmeterol (ADVAIR) 250-50 MCG/DOSE AEPB Inhale into the lungs. ON HOLD D/P STUDY      . gabapentin (NEURONTIN) 300 MG capsule Take 300 mg by mouth 3 (three) times daily.       [name] levocetirizine (XYZAL) 5 MG tablet Take 1 tablet (5 mg total) by mouth every evening.  30 tablet  0  . montelukast (SINGULAIR) 10 MG tablet Take by mouth.       [name] omeprazole (PRILOSEC) 40 MG capsule Take 40 mg by mouth daily.       No current facility-administered medications for this visit.   OBJECTIVE: Filed Vitals:   09/09/14 1210  BP: 112/79  Pulse: 87  Temp: 98.1 F (36.7 C)  Resp: 20   Body mass index is 27.79 kg/(m^2). ECOG FS:0 - Asymptomatic Ocular: Sclerae unicteric, pupils equal, round and reactive to light Ear-nose-throat: Oropharynx clear, dentition fair Lymphatic: No cervical or supraclavicular adenopathy Lungs no rales or rhonchi, good excursion bilaterally Heart regular rate and rhythm, no murmur appreciated Abd soft, nontender, positive bowel sounds MSK no focal spinal tenderness, no joint edema Neuro: non-focal, well-oriented, appropriate affect Breasts: Deferred  LAB RESULTS: CMP     Component Value Date/Time   NA 138 09/01/2012 1104   K 4.0 09/01/2012 1104   CL 106 09/01/2012 1104   CO2 24 09/01/2012 1104   GLUCOSE 101* 09/01/2012 1104   BUN 7 09/01/2012 1104   CREATININE 0.73 09/01/2012 1104   CALCIUM 9.4 09/01/2012 1104   PROT 6.8 08/28/2012 0630   ALBUMIN 3.5 08/28/2012 0630   AST 337* 08/28/2012 0630   ALT 67* 08/28/2012 0630   ALKPHOS 85 08/28/2012 0630   BILITOT 0.3 08/28/2012 0630   GFRNONAA >90 09/01/2012 1104   GFRAA >90 09/01/2012 1104   No results found for this basename: SPEP, UPEP,  kappa and lambda light chains   Lab Results  Component Value Date   WBC 6.9 09/09/2014   NEUTROABS 5.0 09/09/2014   HGB 12.8 09/09/2014   HCT 38.6 09/09/2014   MCV 85 09/09/2014   PLT 292 09/09/2014   No results found for this basename: LABCA2   No components found with this basename: [id number]    No results found for this basename: INR,  in the last 168 hours  STUDIES: No results found.  ASSESSMENT/PLAN: Sherri [name] is 48 year old African American female with pernicious anemia. Her Hgb today was 12.8, MCV 85 and her iron studies were good. She is asymptomatic and does not need iron at this time.   We will give her B12 today.  We will see her back in 3 months for labs and follow-up.  She is  knows to call here with any questions or concerns and to go to the ED in the event of an emergency. We can certainly see her sooner if need be.   Verdie Mosher, NP 09/09/2014 2:49 PM

## 2014-09-09 NOTE — Patient Instructions (Signed)

## 2014-09-11 ENCOUNTER — Telehealth: Payer: Self-pay | Admitting: *Deleted

## 2014-09-11 NOTE — Telephone Encounter (Signed)
Message copied by Anselm Jungling on Fri Sep 11, 2014  4:33 PM ------      Message from: Arlan Organ R      Created: Wed Sep 09, 2014  6:19 PM       Call - iron is ok. pete ------

## 2014-09-28 ENCOUNTER — Encounter (HOSPITAL_COMMUNITY): Payer: Self-pay | Admitting: Emergency Medicine

## 2014-09-28 ENCOUNTER — Emergency Department (INDEPENDENT_AMBULATORY_CARE_PROVIDER_SITE_OTHER)
Admission: EM | Admit: 2014-09-28 | Discharge: 2014-09-28 | Disposition: A | Discharge status: Home / Self Care | Payer: Medicare Other | Source: Home / Self Care | Attending: Emergency Medicine | Admitting: Emergency Medicine

## 2014-09-28 ENCOUNTER — Ambulatory Visit (HOSPITAL_COMMUNITY): Payer: Medicare Other | Attending: Emergency Medicine

## 2014-09-28 DIAGNOSIS — K449 Diaphragmatic hernia without obstruction or gangrene: Secondary | ICD-10-CM | POA: Diagnosis not present

## 2014-09-28 DIAGNOSIS — J45909 Unspecified asthma, uncomplicated: Secondary | ICD-10-CM | POA: Insufficient documentation

## 2014-09-28 DIAGNOSIS — Y939 Activity, unspecified: Secondary | ICD-10-CM | POA: Insufficient documentation

## 2014-09-28 DIAGNOSIS — Y92009 Unspecified place in unspecified non-institutional (private) residence as the place of occurrence of the external cause: Secondary | ICD-10-CM

## 2014-09-28 DIAGNOSIS — T1490 Injury, unspecified: Secondary | ICD-10-CM

## 2014-09-28 DIAGNOSIS — W19XXXA Unspecified fall, initial encounter: Secondary | ICD-10-CM

## 2014-09-28 DIAGNOSIS — S299XXA Unspecified injury of thorax, initial encounter: Secondary | ICD-10-CM | POA: Diagnosis not present

## 2014-09-28 DIAGNOSIS — K219 Gastro-esophageal reflux disease without esophagitis: Secondary | ICD-10-CM | POA: Diagnosis not present

## 2014-09-28 DIAGNOSIS — R079 Chest pain, unspecified: Secondary | ICD-10-CM | POA: Insufficient documentation

## 2014-09-28 DIAGNOSIS — R0789 Other chest pain: Secondary | ICD-10-CM

## 2014-09-28 NOTE — ED Provider Notes (Signed)
Chief Complaint   Fall   History of Present Illness   Sherri [name] is a 48 year old female with multiple painful medical problems who slipped and fell a week ago on a sidewalk in front of neighbors house. She landed on her right side with her right arm in abduction. She did not hit her head there was no loss of consciousness. Ever since then she's had pain in her right and left chest, sternum, neck, upper back, lower back, knees, and right shoulder. It hurts to breathe but she's had no fever, chills, shortness of breath, or hemoptysis. He denies any neurological symptoms. She's not had any abdominal pain, headache, dizziness, or syncope. She saw her primary care physician 5 days ago with the examiner but did not do any x-rays. They gave her hydrocodone for pain and she still has plenty of these. She does comes in today because she thinks she needs an x-ray.  Review of Systems   Other than as noted above, the patient denies any of the following symptoms: ENT:  No headache, facial pain, or bleeding from the nose or ears.  No loose or broken teeth. Neck:  No neck pain or stiffnes. Cardiac:  No chest pain. No palpitations, dizziness, syncope or fainting. GI:  No abdominal pain. No nausea or vomiting. M-S:  No extremity pain, swelling, bruising, limited ROM, or back pain. Neuro:  No loss of consciousness, seizure activity, dizziness, vertigo, paresthesias, numbness, or weakness.  No difficulty with speech or ambulation.  PMFSH   Past medical history, family history, social history, meds, and allergies were reviewed.  She is allergic to Tylenol, ibuprofen, and amoxicillin. Current meds include albuterol, baclofen, vitamin D, vitamin B12, Flexeril, Clarinex, Valium, Advair, Neurontin, Xyzal, Singulair, and omeprazole. She has a medical history of pernicious anemia, herniated lumbar disc, environmental allergies, angioedema, DVT, asthma, seasonal allergies, urticaria, chronic lower back pain,  anxiety, depression, gastroesophageal reflux, arthritis, chronic kidney disease, peripheral vascular disease, and stiff person syndrome.  Physical Examination    Vital signs:  There were no vitals taken for this visit. General:  Alert, oriented and in no distress. Eye:  PERRL, full EOMs. ENT:  No cranial or facial tenderness to palpation. Neck:  Tender to palpation over trapezius ridges.  Full ROM without pain. Heart:  Regular rhythm.  No extrasystoles, gallops, or murmers. Lungs:  There was diffuse chest wall tenderness to palpation both anteriorly and posteriorly, no swelling, bruising, or deformity. Breath sounds clear and equal bilaterally.  No wheezes, rales or rhonchi. Abdomen:  Non tender. Back:  Tender to palpation over upper lower back.  Full ROM without pain. Extremities:  No tenderness, swelling, bruising or deformity.  Full ROM of all joints without pain.  Pulses full.  Brisk capillary refill. Neuro:  Alert and oriented times 3.  Cranial nerves intact.  No muscle weakness.  Sensation intact to light touch.  Gait normal. Skin:  No bruising, abrasions, or lacerations.  Radiology   Dg Chest 2 View  09/28/2014   CLINICAL DATA:  Larey SeatFell 1 week ago landing on RIGHT side, RIGHT side chest pain, personal history of asthma, GERD  EXAM: CHEST  2 VIEW  COMPARISON:  08/28/2012, CT chest 08/29/2012  FINDINGS: Normal heart size and pulmonary vascularity.  Air-fluid level at inferior mediastinum consistent with moderate-sized hiatal hernia.  Mediastinal contours otherwise normal.  Lungs clear.  No pleural effusion or pneumothorax.  Bones diffusely demineralized.  No definite rib fractures identified.  IMPRESSION: Moderate-sized hiatal hernia.  Otherwise negative exam.  Electronically Signed   By: Ulyses Southward M.D.   On: 09/28/2014 17:41   Assessment   The primary encounter diagnosis was Fall with injury. Diagnoses of Place of occurrence, home and Musculoskeletal chest pain were also pertinent to  this visit.  Plan   1.  Meds:  The following meds were prescribed:   Discharge Medication List as of 09/28/2014  5:57 PM      2.  Patient Education/Counseling:  The patient was given appropriate handouts, self care instructions, and instructed in symptomatic relief.    3.  Follow up:  The patient was told to follow up here if no better in 3 to 4 days, or sooner if becoming worse in any way, and given some red flag symptoms such as increasing pain or new neurological symptoms which would prompt immediate return.  Follow up here if necessary.     Reuben Likes, MD 09/28/14 [phone]

## 2014-09-28 NOTE — Discharge Instructions (Signed)

## 2014-09-28 NOTE — ED Notes (Signed)
Pt  Reports       Fell   6   Days  Ago     And  Injured  Her  r  And  l  Sides    -  Seen       By  pcp            sev  Days  Ago              She  Ambulated  To  room  With a  Steady  Fluid  Gait                She   Reports          Still  Has  Some  Pain  When  She  Breathes

## 2014-10-02 ENCOUNTER — Emergency Department (INDEPENDENT_AMBULATORY_CARE_PROVIDER_SITE_OTHER)
Admission: EM | Admit: 2014-10-02 | Discharge: 2014-10-02 | Disposition: A | Discharge status: Home / Self Care | Payer: Medicare Other | Source: Home / Self Care | Attending: Family Medicine | Admitting: Family Medicine

## 2014-10-02 ENCOUNTER — Encounter (HOSPITAL_COMMUNITY): Payer: Self-pay | Admitting: Emergency Medicine

## 2014-10-02 DIAGNOSIS — N189 Chronic kidney disease, unspecified: Secondary | ICD-10-CM | POA: Diagnosis not present

## 2014-10-02 DIAGNOSIS — M791 Myalgia: Secondary | ICD-10-CM

## 2014-10-02 DIAGNOSIS — M7918 Myalgia, other site: Secondary | ICD-10-CM

## 2014-10-02 MED ORDER — DICLOFENAC SODIUM 1 % TD GEL: 2.0000 g | Freq: Four times a day (QID) | TRANSDERMAL | Status: DC

## 2014-10-02 MED ORDER — DICLOFENAC SODIUM 75 MG PO TBEC: 75.0000 mg | DELAYED_RELEASE_TABLET | Freq: Two times a day (BID) | ORAL | Status: DC

## 2014-10-02 MED ORDER — LIDOCAINE 5 % EX PTCH: 1.0000 | MEDICATED_PATCH | CUTANEOUS | Status: DC

## 2014-10-02 MED ORDER — HYDROCODONE-ACETAMINOPHEN 7.5-325 MG PO TABS: 1.0000 | ORAL_TABLET | Freq: Four times a day (QID) | ORAL | Status: DC | PRN

## 2014-10-02 NOTE — ED Provider Notes (Signed)
CSN: [id number]     Arrival date & time 10/02/14  1325 History   First MD Initiated Contact with Patient 10/02/14 1333     Chief Complaint  Patient presents with  . Chest Pain    ribs and sternum from a previous fall   (Consider location/radiation/quality/duration/timing/severity/associated sxs/prior Treatment) HPI  Continue pain: pt seen on 10/5 for pain s/p fall. Since that time pain is only slightly better. Pain is in her chest wall and is worse w/ movement adn deep breathing. Hydrocodone has worked (taking BID). lidoderm patches w/ some benefit. Heating pad w/ benefit.    Past Medical History  Diagnosis Date  . Pernicious anemia 10/30/2011  . Lumbar herniated disc   . Environmental allergies   . Angioedema   . DVT (deep venous thrombosis)   . Asthma   . Seasonal allergies   . Urticaria   . Chronic back pain   . Anxiety   . Depression   . GERD (gastroesophageal reflux disease)   . Arthritis   . Chronic kidney disease   . Peripheral vascular disease   . CAP (community acquired pneumonia) 08/29/2012  . Stiff person syndrome   . Hiatal hernia 03/21/2013   Past Surgical History  Procedure Laterality Date  . Hernia repair    . Inguinal hernia 1983  1983   Family History  Problem Relation Age of Onset  . Cancer Mother   . Other Mother   . COPD Father   . Asthma Brother   . Cancer Brother     colon  . Heart attack Brother   . Seizures Brother    History  Substance Use Topics  . Smoking status: Never Smoker   . Smokeless tobacco: Never Used     Comment: never used product  . Alcohol Use: Yes     Comment: Occasional   OB History   Grav Para Term Preterm Abortions TAB SAB Ect Mult Living                 Review of Systems Per HPI with all other pertinent systems negative.   Allergies  Amoxicillin and Ibuprofen  Home Medications   Prior to Admission medications   Medication Sig Start Date End Date Taking? Authorizing Provider  albuterol (PROVENTIL  HFA;VENTOLIN HFA) 108 (90 BASE) MCG/ACT inhaler Inhale 1 puff into the lungs.  06/20/13  Yes Waymon Budgelinton D Young, MD  baclofen (LIORESAL) 20 MG tablet 20 mg 3 (three) times daily.  10/19/12  Yes Historical Provider, MD  cyclobenzaprine (FLEXERIL) 10 MG tablet 10 mg at bedtime.  10/21/12  Yes Historical Provider, MD  desloratadine (CLARINEX) 5 MG tablet Take 1 tablet (5 mg total) by mouth daily. 11/06/11  Yes Waymon Budgelinton D Young, MD  diazepam (VALIUM) 10 MG tablet One tablet five times a day 04/02/14  Yes York Spanielharles K Willis, MD  Fluticasone-Salmeterol (ADVAIR) 250-50 MCG/DOSE AEPB Inhale into the lungs. ON HOLD D/P STUDY 03/14/12  Yes Waymon Budgelinton D Young, MD  gabapentin (NEURONTIN) 300 MG capsule Take 300 mg by mouth 3 (three) times daily.    Yes Historical Provider, MD  levocetirizine (XYZAL) 5 MG tablet Take 1 tablet (5 mg total) by mouth every evening. 11/06/11  Yes Waymon Budgelinton D Young, MD  montelukast (SINGULAIR) 10 MG tablet Take by mouth.    Yes Historical Provider, MD  omeprazole (PRILOSEC) 40 MG capsule Take 40 mg by mouth daily.   Yes Historical Provider, MD  cholecalciferol (VITAMIN D) 1000 UNITS tablet Take 1,000 Units by  mouth daily.    Historical Provider, MD  cyanocobalamin (,VITAMIN B-12,) 1000 MCG/ML injection Inject 1,000 mcg into the muscle every 3 (three) months.     Historical Provider, MD  diclofenac (VOLTAREN) 75 MG EC tablet Take 1 tablet (75 mg total) by mouth 2 (two) times daily. 10/02/14   Ozella Rocksavid J Rondrick Barreira, MD  EPINEPHrine (EPI-PEN) 0.3 mg/0.3 mL DEVI Inject 0.3 mg into the muscle as needed. For life threatening allergic reaction    Historical Provider, MD  EPIPEN 2-PAK 0.3 MG/0.3ML SOAJ injection USE AS DIRECTED FOR  SEVERE  ALLERGIC  REACTIONS    Waymon Budgelinton D Young, MD  HYDROcodone-acetaminophen (NORCO) 7.5-325 MG per tablet Take 1 tablet by mouth every 6 (six) hours as needed for moderate pain or severe pain. 10/02/14   Ozella Rocksavid J Daison Braxton, MD  lidocaine (LIDODERM) 5 % Place 1 patch onto the skin daily.  Remove & Discard patch within 12 hours or as directed by MD 10/02/14   Ozella Rocksavid J Armentha Branagan, MD   BP 110/74  Pulse 112  Temp(Src) 99.5 F (37.5 C) (Oral)  Resp 12  SpO2 98% Physical Exam  Constitutional: She is oriented to person, place, and time. She appears well-developed and well-nourished.  HENT:  Head: Normocephalic and atraumatic.  Eyes: EOM are normal. Pupils are equal, round, and reactive to light.  Neck: Normal range of motion.  Cardiovascular: Normal rate and regular rhythm.   Pulmonary/Chest: Effort normal and breath sounds normal.  Abdominal: Soft. Bowel sounds are normal.  Musculoskeletal:  L proximal lateral thigh ecchymosis mild intermittent chest wall tenderness to palpation.   Neurological: She is alert and oriented to person, place, and time.  Skin: Skin is warm. No rash noted.  Psychiatric: She has a normal mood and affect. Her behavior is normal. Thought content normal.    ED Course  Procedures (including critical care time) Labs Review Labs Reviewed - No data to display  Imaging Review No results found.   MDM   1. Musculoskeletal pain   2. CKD (chronic kidney disease), unspecified stage   Reviewed medical chart from previous [facility] visit  Refill lidoderm patch REfill limited number of norco. Needs further refills from PCP Start Voltaren Gel for pain. Oral NSAIDs are not an option due to CKD Precautions given and all questions answered  Shelly Flattenavid Tyshun Tuckerman, MD Family Medicine 10/02/2014, 2:44 PM     Ozella Rocksavid J Myosha Cuadras, MD 10/02/14 1444

## 2014-10-02 NOTE — Discharge Instructions (Signed)
Your pain will likely continue to improve with time Please stay active and use the voltaren gel regularly  Please only use the vicodin for extreme pain Please follow up with your regular doctor for further refills

## 2014-10-02 NOTE — ED Notes (Signed)
Pt fell at the end of September and has since been seen by her PCP and here on Monday for the pain in her sternum and rib cage.  Pt told the doctor on Monday that she had enough pain medication to last her but she has only one left and nothing for the weekend.

## 2014-10-14 DIAGNOSIS — J45909 Unspecified asthma, uncomplicated: Secondary | ICD-10-CM | POA: Diagnosis not present

## 2014-10-14 DIAGNOSIS — M545 Low back pain: Secondary | ICD-10-CM | POA: Diagnosis not present

## 2014-10-14 DIAGNOSIS — N289 Disorder of kidney and ureter, unspecified: Secondary | ICD-10-CM | POA: Diagnosis not present

## 2014-11-02 ENCOUNTER — Other Ambulatory Visit: Payer: Self-pay | Admitting: Adult Health

## 2014-11-02 MED ORDER — DIAZEPAM 10 MG PO TABS: ORAL_TABLET | ORAL | Status: DC

## 2014-11-02 NOTE — Telephone Encounter (Signed)
Rx signed and faxed.

## 2014-11-02 NOTE — Telephone Encounter (Signed)
Request forwarded to provider for approval  

## 2014-11-03 ENCOUNTER — Telehealth: Payer: Self-pay | Admitting: Neurology

## 2014-11-03 NOTE — Telephone Encounter (Signed)
She is aware 

## 2014-11-03 NOTE — Telephone Encounter (Signed)
Patient requesting refill of diazepam 10 mg 5 times daily. She uses [facility] mail order  Fax: [phone]. Best call back number is [phone].

## 2014-11-03 NOTE — Telephone Encounter (Signed)
Rx has already been sent to the pharmacy.  I called the patient back

## 2014-12-09 ENCOUNTER — Telehealth: Payer: Self-pay | Admitting: Hematology & Oncology

## 2014-12-09 ENCOUNTER — Encounter: Payer: Self-pay | Admitting: Hematology & Oncology

## 2014-12-09 ENCOUNTER — Other Ambulatory Visit (HOSPITAL_BASED_OUTPATIENT_CLINIC_OR_DEPARTMENT_OTHER): Payer: Medicare Other | Admitting: Lab

## 2014-12-09 ENCOUNTER — Ambulatory Visit (HOSPITAL_BASED_OUTPATIENT_CLINIC_OR_DEPARTMENT_OTHER): Payer: Medicare Other | Admitting: Hematology & Oncology

## 2014-12-09 ENCOUNTER — Ambulatory Visit (HOSPITAL_BASED_OUTPATIENT_CLINIC_OR_DEPARTMENT_OTHER): Payer: Medicare Other

## 2014-12-09 VITALS — BP 131/88 | HR 86 | Temp 97.7°F | Resp 14 | Ht 62.0 in | Wt 151.0 lb

## 2014-12-09 DIAGNOSIS — D510 Vitamin B12 deficiency anemia due to intrinsic factor deficiency: Secondary | ICD-10-CM

## 2014-12-09 DIAGNOSIS — G2582 Stiff-man syndrome: Secondary | ICD-10-CM | POA: Diagnosis not present

## 2014-12-09 DIAGNOSIS — D500 Iron deficiency anemia secondary to blood loss (chronic): Secondary | ICD-10-CM

## 2014-12-09 LAB — COMPREHENSIVE METABOLIC PANEL
ALT: 18 U/L (ref 0–35)
AST: 22 U/L (ref 0–37)
Albumin: 4.3 g/dL (ref 3.5–5.2)
Alkaline Phosphatase: 91 U/L (ref 39–117)
BUN: 11 mg/dL (ref 6–23)
CALCIUM: 9.5 mg/dL (ref 8.4–10.5)
CHLORIDE: 104 meq/L (ref 96–112)
CO2: 29 meq/L (ref 19–32)
Creatinine, Ser: 1.1 mg/dL (ref 0.50–1.10)
GLUCOSE: 87 mg/dL (ref 70–99)
POTASSIUM: 3.7 meq/L (ref 3.5–5.3)
SODIUM: 140 meq/L (ref 135–145)
Total Bilirubin: 0.3 mg/dL (ref 0.2–1.2)
Total Protein: 7.3 g/dL (ref 6.0–8.3)

## 2014-12-09 LAB — CBC WITH DIFFERENTIAL (CANCER CENTER ONLY)
BASO#: 0 10*3/uL (ref 0.0–0.2)
BASO%: 0 % (ref 0.0–2.0)
EOS ABS: 0 10*3/uL (ref 0.0–0.5)
EOS%: 0 % (ref 0.0–7.0)
HCT: 37.4 % (ref 34.8–46.6)
HGB: 12.4 g/dL (ref 11.6–15.9)
LYMPH#: 1.6 10*3/uL (ref 0.9–3.3)
LYMPH%: 20 % (ref 14.0–48.0)
MCH: 28.7 pg (ref 26.0–34.0)
MCHC: 33.2 g/dL (ref 32.0–36.0)
MCV: 87 fL (ref 81–101)
MONO#: 0.3 10*3/uL (ref 0.1–0.9)
MONO%: 4.1 % (ref 0.0–13.0)
NEUT%: 75.9 % (ref 39.6–80.0)
NEUTROS ABS: 6.2 10*3/uL (ref 1.5–6.5)
Platelets: 272 10*3/uL (ref 145–400)
RBC: 4.32 10*6/uL (ref 3.70–5.32)
RDW: 12.5 % (ref 11.1–15.7)
WBC: 8.1 10*3/uL (ref 3.9–10.0)

## 2014-12-09 LAB — IRON AND TIBC CHCC
%SAT: 29 % (ref 21–57)
IRON: 76 ug/dL (ref 41–142)
TIBC: 259 ug/dL (ref 236–444)
UIBC: 182 ug/dL (ref 120–384)

## 2014-12-09 LAB — VITAMIN B12: Vitamin B-12: 446 pg/mL (ref 211–911)

## 2014-12-09 LAB — FERRITIN CHCC: FERRITIN: 375 ng/mL — AB (ref 9–269)

## 2014-12-09 MED ORDER — CYANOCOBALAMIN 1000 MCG/ML IJ SOLN
INTRAMUSCULAR | Status: AC
Start: 2014-12-09 — End: 2014-12-09
  Filled 2014-12-09: qty 1

## 2014-12-09 MED ORDER — CYANOCOBALAMIN 1000 MCG/ML IJ SOLN
1000.0000 ug | Freq: Once | INTRAMUSCULAR | Status: AC
  Administered 2014-12-09: 1000 ug via INTRAMUSCULAR

## 2014-12-09 NOTE — Telephone Encounter (Signed)
Mailed 02-2015 schedule °

## 2014-12-09 NOTE — Progress Notes (Signed)
Hematology and Oncology Follow Up Visit  Sherri SlimmerSamantha [name] [id number] [birth year] 48 y.o. 12/09/2014   Principle Diagnosis:  1. Pernicious anemia -- anti-intrinsic factor antibodies. 2. Intermittent iron-deficiency anemia. 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 4. 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  Current Therapy:   1. Vitamin B12, 1 mg IM q.3 months. 2. IV iron as indicated.     Interim History:  [name] is for followup. Last saw her back in September. She has been doing okay. Unfortunately, she is under a lot of stress. Unfortunately, her daughter is pregnant with her second child. Her daughter is not married. The father is a "deadbeat" and is not going to provide any support. This is definitely a real problem for Sherri [name].  She has had chronic pain problems. She is how to deal with the Stiff Man Syndrome issues. She is on Valium which does seem to help. The patient also takes some Flexeril.  As far as her blood issues are concerned, she's done well. Her last iron studies back in September showed a ferritin of 407. Her vitamin B 12 was 525.  She's had no bleeding. She has intermittent monthly cycles. She thinks these are somehow affected by her stress.  Medications: Current outpatient prescriptions: albuterol (PROVENTIL HFA;VENTOLIN HFA) 108 (90 BASE) MCG/ACT inhaler, Inhale 1 puff into the lungs every 6 (six) hours as needed. PRO AIR, Disp: , Rfl: ;  baclofen (LIORESAL) 20 MG tablet, 20 mg 3 (three) times daily. , Disp: , Rfl: ;  cholecalciferol (VITAMIN D) 1000 UNITS tablet, Take 1,000 Units by mouth daily., Disp: , Rfl:  cyanocobalamin (,VITAMIN B-12,) 1000 MCG/ML injection, Inject 1,000 mcg into the muscle every 3 (three) months. , Disp: , Rfl: ;  cyclobenzaprine (FLEXERIL) 10 MG tablet, 10 mg at bedtime. , Disp: , Rfl: ;  desloratadine (CLARINEX) 5 MG tablet, Take 1 tablet (5 mg total) by mouth daily., Disp: 30 tablet, Rfl: 0;  diazepam (VALIUM) 10 MG tablet, One tablet five  times a day, Disp: 450 tablet, Rfl: 1 diclofenac sodium (VOLTAREN) 1 % GEL, Apply 2 g topically 4 (four) times daily., Disp: 1 Tube, Rfl: 0;  EPIPEN 2-PAK 0.3 MG/0.3ML SOAJ injection, USE AS DIRECTED FOR  SEVERE  ALLERGIC  REACTIONS, Disp: 1 Device, Rfl: 11;  Fluticasone-Salmeterol (ADVAIR) 250-50 MCG/DOSE AEPB, Inhale into the lungs. , Disp: , Rfl: ;  gabapentin (NEURONTIN) 300 MG capsule, Take 300 mg by mouth 3 (three) times daily. , Disp: , Rfl:  levocetirizine (XYZAL) 5 MG tablet, Take 1 tablet (5 mg total) by mouth every evening., Disp: 30 tablet, Rfl: 0;  lidocaine (LIDODERM) 5 %, Place 1 patch onto the skin daily. Remove & Discard patch within 12 hours or as directed by MD, Disp: 30 patch, Rfl: 0;  loratadine (CLARITIN) 10 MG tablet, Take 10 mg by mouth 2 (two) times daily., Disp: , Rfl: ;  montelukast (SINGULAIR) 10 MG tablet, Take by mouth. , Disp: , Rfl:  omeprazole (PRILOSEC) 40 MG capsule, Take 40 mg by mouth daily., Disp: , Rfl: ;  HYDROcodone-acetaminophen (NORCO) 7.5-325 MG per tablet, Take 1 tablet by mouth every 6 (six) hours as needed for moderate pain or severe pain. (Patient not taking: Reported on 12/09/2014), Disp: 15 tablet, Rfl: 0  Allergies:  Allergies  Allergen Reactions  . Amoxicillin Diarrhea  . Ibuprofen     Does not take due to hx of renal insufficiency    Past Medical History, Surgical history, Social history, and Family History were  reviewed and updated.  Review of Systems: As above  Physical Exam:  height is 5\' 2"  (1.575 m) and weight is 151 lb (68.493 kg). Her oral temperature is 97.7 F (36.5 C). Her blood pressure is 131/88 and her pulse is 86. Her respiration is 14.   Well-developed and well-nourished African American female. She has no ocular or oral lesions. There are no lymph nodes in the neck. Lungs are clear. Cardiac exam regular in rhythm. No murmurs. Abdomen soft. There is no palpable liver or spleen. Back exam no tenderness over the spine.  Extremities shows no clubbing cyanosis or edema. Skin exam no rashes.  Lab Results  Component Value Date   WBC 8.1 12/09/2014   HGB 12.4 12/09/2014   HCT 37.4 12/09/2014   MCV 87 12/09/2014   PLT 272 12/09/2014     Chemistry      Component Value Date/Time   NA 140 12/09/2014 1032   K 3.7 12/09/2014 1032   CL 104 12/09/2014 1032   CO2 29 12/09/2014 1032   BUN 11 12/09/2014 1032   CREATININE 1.10 12/09/2014 1032      Component Value Date/Time   CALCIUM 9.5 12/09/2014 1032   ALKPHOS 91 12/09/2014 1032   AST 22 12/09/2014 1032   ALT 18 12/09/2014 1032   BILITOT 0.3 12/09/2014 1032      Ferritin is 375. Iron saturation is 29%. Total iron is 76.   Impression and Plan: [name] is [age] year-old [place] female. She has a pretty decent blood count today. Her iron levels look good.[name]. Her hemoglobin is normal. Her MCV is up.    We will go ahead and give her the vitamin B12.  Will have her come back to see us in another 3 months. I will certainly pray hard for her so she can deal with her daughter's problems and hopefully not get further stressed out.   Josph MachoENNEVER,PETER R, MD 12/16/20156:31 PM

## 2014-12-09 NOTE — Patient Instructions (Signed)

## 2014-12-15 ENCOUNTER — Telehealth: Payer: Self-pay | Admitting: Neurology

## 2014-12-15 NOTE — Telephone Encounter (Signed)
WID - Patient called again, after listening to voice message left by Lupita Leashonna, RN.  Patient stated she hit her head when she fell, bruised and swollen Right cheek bone.  Scrapped skin on right wrist and cut on elbow.  Has medication that was prescribed by Dr. Allyne GeeSanders for inflammation and also has Hydrocodone that she takes for back pain.  Please call and advise.

## 2014-12-15 NOTE — Telephone Encounter (Signed)
Patient fell again. This is her second fall since October. She hit the bathroom door and the moulding and hit the right cheekbone and she is bruised and swollen. She tried to brace the fall and hurt the right elbow and skinned the right wrist. She has an appointment with [facility] for pain management in January. She just wanted to let Dr. Anne HahnWillis know that she had another fall. When she starts to fall or trips she cannot catch herself or stop herself from falling. She went to the doctor's office today and was given some hydrocodone for the pain which is helping. She would appreciate a call back next week when Dr. Anne HahnWillis is back in the office. Thanks.

## 2014-12-15 NOTE — Telephone Encounter (Signed)
The patient is calling to let us know that she fell yesterday in the bathroom possibly on the door molding. Patient has lacerations on her left arm and right check bone.  Please call.

## 2014-12-15 NOTE — Telephone Encounter (Signed)
Left message for patient to return call and clarify her concerns, or if she hit her head with the fall.

## 2014-12-15 NOTE — Telephone Encounter (Signed)
Patient returning Lupita LeashDonna RN's call.

## 2014-12-17 ENCOUNTER — Telehealth: Payer: Self-pay | Admitting: Neurology

## 2014-12-17 NOTE — Telephone Encounter (Signed)
Patient called and said she has been taking the hydrocodone every 6 hours (see previous phone message) and now is shaking. Advised she should stop the hydrocodone if she is having side effects. Spoke to daughter who said mother keeps repeating herself and she has taken pain medications before and not acted this way. I advised daughter to take patient to the emergency room or to call 911, especially if patient appears altered with a recent fall where she hit her head. Explained it could be a hematoma or bleeding which could be life threatening. Daughter understood and is going to take patient to the ED.

## 2014-12-18 ENCOUNTER — Encounter (HOSPITAL_COMMUNITY): Payer: Self-pay | Admitting: *Deleted

## 2014-12-18 ENCOUNTER — Emergency Department (HOSPITAL_COMMUNITY): Payer: Medicare Other

## 2014-12-18 ENCOUNTER — Observation Stay (HOSPITAL_COMMUNITY)
Admission: EM | Admit: 2014-12-18 | Discharge: 2014-12-18 | Disposition: A | Discharge status: Home / Self Care | Payer: Medicare Other | Attending: Internal Medicine | Admitting: Internal Medicine

## 2014-12-18 DIAGNOSIS — Z86718 Personal history of other venous thrombosis and embolism: Secondary | ICD-10-CM | POA: Insufficient documentation

## 2014-12-18 DIAGNOSIS — N189 Chronic kidney disease, unspecified: Secondary | ICD-10-CM | POA: Insufficient documentation

## 2014-12-18 DIAGNOSIS — F419 Anxiety disorder, unspecified: Secondary | ICD-10-CM | POA: Insufficient documentation

## 2014-12-18 DIAGNOSIS — K449 Diaphragmatic hernia without obstruction or gangrene: Secondary | ICD-10-CM | POA: Insufficient documentation

## 2014-12-18 DIAGNOSIS — J4520 Mild intermittent asthma, uncomplicated: Secondary | ICD-10-CM

## 2014-12-18 DIAGNOSIS — J45909 Unspecified asthma, uncomplicated: Secondary | ICD-10-CM | POA: Diagnosis present

## 2014-12-18 DIAGNOSIS — W19XXXA Unspecified fall, initial encounter: Secondary | ICD-10-CM | POA: Diagnosis not present

## 2014-12-18 DIAGNOSIS — G2582 Stiff-man syndrome: Secondary | ICD-10-CM | POA: Diagnosis not present

## 2014-12-18 DIAGNOSIS — I739 Peripheral vascular disease, unspecified: Secondary | ICD-10-CM | POA: Diagnosis not present

## 2014-12-18 DIAGNOSIS — R269 Unspecified abnormalities of gait and mobility: Secondary | ICD-10-CM | POA: Diagnosis not present

## 2014-12-18 DIAGNOSIS — Z881 Allergy status to other antibiotic agents status: Secondary | ICD-10-CM | POA: Insufficient documentation

## 2014-12-18 DIAGNOSIS — F329 Major depressive disorder, single episode, unspecified: Secondary | ICD-10-CM | POA: Insufficient documentation

## 2014-12-18 DIAGNOSIS — D510 Vitamin B12 deficiency anemia due to intrinsic factor deficiency: Secondary | ICD-10-CM

## 2014-12-18 DIAGNOSIS — M1990 Unspecified osteoarthritis, unspecified site: Secondary | ICD-10-CM | POA: Diagnosis not present

## 2014-12-18 DIAGNOSIS — W010XXA Fall on same level from slipping, tripping and stumbling without subsequent striking against object, initial encounter: Secondary | ICD-10-CM | POA: Diagnosis not present

## 2014-12-18 DIAGNOSIS — M6282 Rhabdomyolysis: Secondary | ICD-10-CM | POA: Insufficient documentation

## 2014-12-18 DIAGNOSIS — S0990XA Unspecified injury of head, initial encounter: Secondary | ICD-10-CM | POA: Diagnosis not present

## 2014-12-18 DIAGNOSIS — R42 Dizziness and giddiness: Principal | ICD-10-CM

## 2014-12-18 DIAGNOSIS — K219 Gastro-esophageal reflux disease without esophagitis: Secondary | ICD-10-CM | POA: Diagnosis not present

## 2014-12-18 DIAGNOSIS — G8929 Other chronic pain: Secondary | ICD-10-CM | POA: Diagnosis not present

## 2014-12-18 DIAGNOSIS — Z886 Allergy status to analgesic agent status: Secondary | ICD-10-CM | POA: Diagnosis not present

## 2014-12-18 LAB — COMPREHENSIVE METABOLIC PANEL
ALBUMIN: 3.3 g/dL — AB (ref 3.5–5.2)
ALK PHOS: 81 U/L (ref 39–117)
ALK PHOS: 83 U/L (ref 39–117)
ALT: 19 U/L (ref 0–35)
ALT: 22 U/L (ref 0–35)
ANION GAP: 6 (ref 5–15)
AST: 26 U/L (ref 0–37)
AST: 22 U/L (ref 0–37)
Albumin: 3.6 g/dL (ref 3.5–5.2)
Anion gap: 11 (ref 5–15)
BUN: 9 mg/dL (ref 6–23)
BUN: 7 mg/dL (ref 6–23)
CALCIUM: 9.2 mg/dL (ref 8.4–10.5)
CO2: 22 mmol/L (ref 19–32)
CO2: 26 mmol/L (ref 19–32)
CREATININE: 1.04 mg/dL (ref 0.50–1.10)
Calcium: 9 mg/dL (ref 8.4–10.5)
Chloride: 110 mEq/L (ref 96–112)
Chloride: 110 mEq/L (ref 96–112)
Creatinine, Ser: 1.01 mg/dL (ref 0.50–1.10)
GFR calc non Af Amer: 64 mL/min — ABNORMAL LOW (ref >90)
GFR calc non Af Amer: 62 mL/min — ABNORMAL LOW (ref >90)
GFR, EST AFRICAN AMERICAN: 71 mL/min — AB (ref >90)
GFR, EST AFRICAN AMERICAN: 74 mL/min — AB (ref >90)
GLUCOSE: 126 mg/dL — AB (ref 70–99)
Glucose, Bld: 134 mg/dL — ABNORMAL HIGH (ref 70–99)
POTASSIUM: 3.7 mmol/L (ref 3.5–5.1)
POTASSIUM: 3.8 mmol/L (ref 3.5–5.1)
Sodium: 143 mmol/L (ref 135–145)
Sodium: 142 mmol/L (ref 135–145)
TOTAL PROTEIN: 5.9 g/dL — AB (ref 6.0–8.3)
Total Bilirubin: 0.2 mg/dL — ABNORMAL LOW (ref 0.3–1.2)
Total Bilirubin: 0.3 mg/dL (ref 0.3–1.2)
Total Protein: 6.3 g/dL (ref 6.0–8.3)

## 2014-12-18 LAB — RAPID URINE DRUG SCREEN, HOSP PERFORMED
Amphetamines: NOT DETECTED
BARBITURATES: NOT DETECTED
BENZODIAZEPINES: POSITIVE — AB
Cocaine: POSITIVE — AB
Opiates: POSITIVE — AB
Tetrahydrocannabinol: NOT DETECTED

## 2014-12-18 LAB — CBC WITH DIFFERENTIAL/PLATELET
BASOS ABS: 0 10*3/uL (ref 0.0–0.1)
Basophils Relative: 0 % (ref 0–1)
EOS PCT: 0 % (ref 0–5)
Eosinophils Absolute: 0 10*3/uL (ref 0.0–0.7)
HCT: 33.1 % — ABNORMAL LOW (ref 36.0–46.0)
Hemoglobin: 10.5 g/dL — ABNORMAL LOW (ref 12.0–15.0)
LYMPHS PCT: 35 % (ref 12–46)
Lymphs Abs: 1.7 10*3/uL (ref 0.7–4.0)
MCH: 27.3 pg (ref 26.0–34.0)
MCHC: 31.7 g/dL (ref 30.0–36.0)
MCV: 86.2 fL (ref 78.0–100.0)
Monocytes Absolute: 0.3 10*3/uL (ref 0.1–1.0)
Monocytes Relative: 6 % (ref 3–12)
NEUTROS PCT: 59 % (ref 43–77)
Neutro Abs: 2.8 10*3/uL (ref 1.7–7.7)
PLATELETS: 255 10*3/uL (ref 150–400)
RBC: 3.84 MIL/uL — ABNORMAL LOW (ref 3.87–5.11)
RDW: 13.4 % (ref 11.5–15.5)
WBC: 4.8 10*3/uL (ref 4.0–10.5)

## 2014-12-18 LAB — CBC
HEMATOCRIT: 32.6 % — AB (ref 36.0–46.0)
HEMOGLOBIN: 10.7 g/dL — AB (ref 12.0–15.0)
MCH: 28.2 pg (ref 26.0–34.0)
MCHC: 32.8 g/dL (ref 30.0–36.0)
MCV: 86 fL (ref 78.0–100.0)
Platelets: 290 10*3/uL (ref 150–400)
RBC: 3.79 MIL/uL — ABNORMAL LOW (ref 3.87–5.11)
RDW: 13.3 % (ref 11.5–15.5)
WBC: 5.6 10*3/uL (ref 4.0–10.5)

## 2014-12-18 LAB — DIFFERENTIAL
Basophils Absolute: 0 K/uL (ref 0.0–0.1)
Basophils Relative: 0 % (ref 0–1)
Eosinophils Absolute: 0 K/uL (ref 0.0–0.7)
Eosinophils Relative: 0 % (ref 0–5)
Lymphocytes Relative: 34 % (ref 12–46)
Lymphs Abs: 1.9 K/uL (ref 0.7–4.0)
Monocytes Absolute: 0.3 K/uL (ref 0.1–1.0)
Monocytes Relative: 6 % (ref 3–12)
Neutro Abs: 3.3 K/uL (ref 1.7–7.7)
Neutrophils Relative %: 60 % (ref 43–77)

## 2014-12-18 LAB — URINE MICROSCOPIC-ADD ON

## 2014-12-18 LAB — URINALYSIS, ROUTINE W REFLEX MICROSCOPIC
BILIRUBIN URINE: NEGATIVE
Glucose, UA: NEGATIVE mg/dL
Ketones, ur: NEGATIVE mg/dL
Nitrite: POSITIVE — AB
PROTEIN: NEGATIVE mg/dL
Specific Gravity, Urine: 1.012 (ref 1.005–1.030)
UROBILINOGEN UA: 0.2 mg/dL (ref 0.0–1.0)
pH: 5.5 (ref 5.0–8.0)

## 2014-12-18 LAB — I-STAT CHEM 8, ED
BUN: 8 mg/dL (ref 6–23)
Calcium, Ion: 1.21 mmol/L (ref 1.12–1.23)
Chloride: 108 meq/L (ref 96–112)
Creatinine, Ser: 1 mg/dL (ref 0.50–1.10)
Glucose, Bld: 127 mg/dL — ABNORMAL HIGH (ref 70–99)
HCT: 36 % (ref 36.0–46.0)
Hemoglobin: 12.2 g/dL (ref 12.0–15.0)
Potassium: 3.8 mmol/L (ref 3.5–5.1)
Sodium: 143 mmol/L (ref 135–145)
TCO2: 22 mmol/L (ref 0–100)

## 2014-12-18 LAB — PROTIME-INR
INR: 0.9 (ref 0.00–1.49)
Prothrombin Time: 12.2 s (ref 11.6–15.2)

## 2014-12-18 LAB — GLUCOSE, CAPILLARY
Glucose-Capillary: 118 mg/dL — ABNORMAL HIGH (ref 70–99)
Glucose-Capillary: 84 mg/dL (ref 70–99)

## 2014-12-18 LAB — I-STAT TROPONIN, ED: Troponin i, poc: 0 ng/mL (ref 0.00–0.08)

## 2014-12-18 LAB — VITAMIN B12: Vitamin B-12: 703 pg/mL (ref 211–911)

## 2014-12-18 LAB — ETHANOL: Alcohol, Ethyl (B): <5 mg/dL (ref 0–9)

## 2014-12-18 LAB — CBG MONITORING, ED: Glucose-Capillary: 88 mg/dL (ref 70–99)

## 2014-12-18 LAB — APTT: aPTT: 34 seconds (ref 24–37)

## 2014-12-18 LAB — TSH: TSH: 1.2 u[IU]/mL (ref 0.350–4.500)

## 2014-12-18 LAB — CK: Total CK: 73 U/L (ref 7–177)

## 2014-12-18 MED ORDER — ALBUTEROL SULFATE (2.5 MG/3ML) 0.083% IN NEBU: 3.0000 mL | INHALATION_SOLUTION | Freq: Four times a day (QID) | RESPIRATORY_TRACT | Status: DC | PRN

## 2014-12-18 MED ORDER — BACLOFEN 20 MG PO TABS
20.0000 mg | ORAL_TABLET | Freq: Three times a day (TID) | ORAL | Status: DC
  Administered 2014-12-18: 20 mg via ORAL
  Filled 2014-12-18 (×3): qty 1

## 2014-12-18 MED ORDER — CYCLOBENZAPRINE HCL 10 MG PO TABS: 10.0000 mg | ORAL_TABLET | Freq: Three times a day (TID) | ORAL | Status: DC | PRN

## 2014-12-18 MED ORDER — LORATADINE 10 MG PO TABS
10.0000 mg | ORAL_TABLET | Freq: Two times a day (BID) | ORAL | Status: DC
  Administered 2014-12-18: 10 mg via ORAL
  Filled 2014-12-18 (×2): qty 1

## 2014-12-18 MED ORDER — LORATADINE 10 MG PO TABS: 10.0000 mg | ORAL_TABLET | Freq: Every day | ORAL | Status: DC

## 2014-12-18 MED ORDER — HYDROCODONE-ACETAMINOPHEN 7.5-325 MG PO TABS: 1.0000 | ORAL_TABLET | Freq: Four times a day (QID) | ORAL | Status: DC | PRN

## 2014-12-18 MED ORDER — PANTOPRAZOLE SODIUM 40 MG PO TBEC
40.0000 mg | DELAYED_RELEASE_TABLET | Freq: Every day | ORAL | Status: DC
  Administered 2014-12-18: 40 mg via ORAL
  Filled 2014-12-18: qty 1

## 2014-12-18 MED ORDER — MONTELUKAST SODIUM 10 MG PO TABS
10.0000 mg | ORAL_TABLET | Freq: Every day | ORAL | Status: DC
  Filled 2014-12-18: qty 1

## 2014-12-18 MED ORDER — MOMETASONE FURO-FORMOTEROL FUM 100-5 MCG/ACT IN AERO
2.0000 | INHALATION_SPRAY | Freq: Two times a day (BID) | RESPIRATORY_TRACT | Status: DC
  Administered 2014-12-18: 2 via RESPIRATORY_TRACT
  Filled 2014-12-18: qty 8.8

## 2014-12-18 MED ORDER — CYANOCOBALAMIN 1000 MCG/ML IJ SOLN: 1000.0000 ug | INTRAMUSCULAR | Status: DC

## 2014-12-18 MED ORDER — LEVOFLOXACIN 500 MG PO TABS: 500.0000 mg | ORAL_TABLET | Freq: Every day | ORAL | Status: DC

## 2014-12-18 MED ORDER — GABAPENTIN 300 MG PO CAPS
300.0000 mg | ORAL_CAPSULE | Freq: Three times a day (TID) | ORAL | Status: DC
  Administered 2014-12-18: 300 mg via ORAL
  Filled 2014-12-18 (×3): qty 1

## 2014-12-18 MED ORDER — LIDOCAINE 5 % EX PTCH
1.0000 | MEDICATED_PATCH | CUTANEOUS | Status: DC
  Administered 2014-12-18: 1 via TRANSDERMAL
  Filled 2014-12-18 (×2): qty 1

## 2014-12-18 MED ORDER — LEVOCETIRIZINE DIHYDROCHLORIDE 5 MG PO TABS
5.0000 mg | ORAL_TABLET | Freq: Every evening | ORAL | Status: DC
  Filled 2014-12-18: qty 1

## 2014-12-18 MED ORDER — VITAMIN D3 25 MCG (1000 UNIT) PO TABS
1000.0000 [IU] | ORAL_TABLET | Freq: Every day | ORAL | Status: DC
  Administered 2014-12-18: 1000 [IU] via ORAL
  Filled 2014-12-18: qty 1

## 2014-12-18 MED ORDER — ENOXAPARIN SODIUM 40 MG/0.4ML ~~LOC~~ SOLN
40.0000 mg | SUBCUTANEOUS | Status: DC
  Administered 2014-12-18: 40 mg via SUBCUTANEOUS
  Filled 2014-12-18: qty 0.4

## 2014-12-18 MED ORDER — SODIUM CHLORIDE 0.9 % IV SOLN
INTRAVENOUS | Status: DC
  Administered 2014-12-18: 07:00:00 via INTRAVENOUS

## 2014-12-18 MED ORDER — DIAZEPAM 5 MG PO TABS: 5.0000 mg | ORAL_TABLET | Freq: Three times a day (TID) | ORAL | Status: DC | PRN

## 2014-12-18 NOTE — Discharge Summary (Signed)
Physician Discharge Summary  Sherri [name] ZOX:[id number] DOB: 1966-09-23 DOA: 12/18/2014  PCP: Gwynneth Aliment, MD  Admit date: 12/18/2014 Discharge date: 12/18/2014  Time spent: >35 minutes  Recommendations for Outpatient Follow-up:  F/u with PCP in 1 week F/u with neurologist in 10 days   Discharge Diagnoses:  Principal Problem:   Dizziness Active Problems:   Asthma with bronchitis   Pernicious anemia   Stiff person syndrome   Discharge Condition: stable   Diet recommendation: regular   Filed Weights   12/18/14 0035 12/18/14 0455  Weight: 72.122 kg (159 lb) 72.4 kg (159 lb 9.8 oz)    History of present illness:  48 y.o. female with history of asthma, pernicious anemia, previous history of DVT, stiff person syndrome presents to the ER because of dizziness after a recent fall (possible concussion). Patient's symptoms started yesterday morning with patient getting dizzy on trying to walk with difficult gait.  Hospital Course:  1. Fall, possible concussion; per neurology evaluation: possible post concussion syndrome;  -neuro exam no focal; CT head: no acute findings; canceled MRI per neurology unlikely related to CVA; cont supportive care; outpatient f/u with neurologist, d/w patient  -fall ? Related to substance abuse; urine tox +cocaine, benzo, opioids; but patient denies; d/w counseled   2. Probable UTI, on empiric atx; afebrile; no leukocytosis;  3. Asthma; stable cont home regimen  4. stiff person syndrome; resumed home regimen; cont outpatient follow up   Procedures:  none (i.e. Studies not automatically included, echos, thoracentesis, etc; not x-rays)  Consultations:  Neurology   Discharge Exam: Filed Vitals:   12/18/14 0455  BP: 120/71  Pulse: 75  Temp: 98.2 F (36.8 C)  Resp: 18    General: alert Cardiovascular: s1,s2 rrr Respiratory: CTA BL  Discharge Instructions  Discharge Instructions    Diet - low sodium heart healthy    Complete by:   As directed      Discharge instructions    Complete by:  As directed   Please follow up with neurologist in 1 week     Increase activity slowly    Complete by:  As directed             Medication List    TAKE these medications        albuterol 108 (90 BASE) MCG/ACT inhaler  Commonly known as:  PROVENTIL HFA;VENTOLIN HFA  Inhale 1 puff into the lungs every 6 (six) hours as needed. PRO AIR     baclofen 20 MG tablet  Commonly known as:  LIORESAL  20 mg 3 (three) times daily.     cholecalciferol 1000 UNITS tablet  Commonly known as:  VITAMIN D  Take 1,000 Units by mouth daily.     cyanocobalamin 1000 MCG/ML injection  Commonly known as:  (VITAMIN B-12)  Inject 1,000 mcg into the muscle every 3 (three) months.     cyclobenzaprine 10 MG tablet  Commonly known as:  FLEXERIL  Take 10 mg by mouth 3 (three) times daily as needed for muscle spasms.     desloratadine 5 MG tablet  Commonly known as:  CLARINEX  Take 1 tablet (5 mg total) by mouth daily.     diazepam 10 MG tablet  Commonly known as:  VALIUM  One tablet five times a day     diclofenac sodium 1 % Gel  Commonly known as:  VOLTAREN  Apply 2 g topically 4 (four) times daily.     EPIPEN 2-PAK 0.3 mg/0.3 mL Soaj injection  Generic drug:  EPINEPHrine  USE AS DIRECTED FOR  SEVERE  ALLERGIC  REACTIONS     Fluticasone-Salmeterol 250-50 MCG/DOSE Aepb  Commonly known as:  ADVAIR  Inhale 1 puff into the lungs 2 (two) times daily.     gabapentin 300 MG capsule  Commonly known as:  NEURONTIN  Take 300 mg by mouth 3 (three) times daily.     HYDROcodone-acetaminophen 7.5-325 MG per tablet  Commonly known as:  NORCO  Take 1 tablet by mouth every 6 (six) hours as needed for moderate pain or severe pain.     levocetirizine 5 MG tablet  Commonly known as:  XYZAL  Take 1 tablet (5 mg total) by mouth every evening.     levofloxacin 500 MG tablet  Commonly known as:  LEVAQUIN  Take 1 tablet (500 mg total) by mouth daily.      lidocaine 5 %  Commonly known as:  LIDODERM  Place 1 patch onto the skin daily. Remove & Discard patch within 12 hours or as directed by MD     loratadine 10 MG tablet  Commonly known as:  CLARITIN  Take 10 mg by mouth 2 (two) times daily.     meloxicam 7.5 MG tablet  Commonly known as:  MOBIC  Take 15 mg by mouth daily.     montelukast 10 MG tablet  Commonly known as:  SINGULAIR  Take by mouth.     omeprazole 40 MG capsule  Commonly known as:  PRILOSEC  Take 40 mg by mouth daily.       Allergies  Allergen Reactions  . Ibuprofen Other (See Comments)    Does not take due to hx of renal insufficiency  . Amoxicillin Diarrhea       Follow-up Information    Follow up with Gwynneth AlimentSANDERS,ROBYN N, MD In 1 week.   Specialty:  Internal Medicine   Contact information:   [address] [place] [id number] [phone]        The results of significant diagnostics from this hospitalization (including imaging, microbiology, ancillary and laboratory) are listed below for reference.    Significant Diagnostic Studies: Ct Head Wo Contrast  12/18/2014   CLINICAL DATA:  Status post fall; hit right side of head, near right eye. Memory loss and altered mental status, acute onset. Initial encounter.  EXAM: CT HEAD WITHOUT CONTRAST  TECHNIQUE: Contiguous axial images were obtained from the base of the skull through the vertex without intravenous contrast.  COMPARISON:  CT of the head performed 01/03/2012, and MRI of the brain performed 09/03/2012  FINDINGS: There is no evidence of acute infarction, mass lesion, or intra- or extra-axial hemorrhage on CT.  Minimal subcortical white matter change likely reflects small vessel ischemic microangiopathy.  The posterior fossa, including the cerebellum, brainstem and fourth ventricle, is within normal limits. The third and lateral ventricles, and basal ganglia are unremarkable in appearance. The cerebral hemispheres are symmetric in  appearance, with normal gray-white differentiation. No mass effect or midline shift is seen.  There is no evidence of fracture; visualized osseous structures are unremarkable in appearance. The visualized portions of the orbits are within normal limits. The paranasal sinuses and mastoid air cells are well-aerated. No significant soft tissue abnormalities are seen.  IMPRESSION: 1. No evidence of traumatic intracranial injury or fracture. 2. Minimal small vessel ischemic microangiopathy.   Electronically Signed   By: Roanna RaiderJeffery  Chang M.D.   On: 12/18/2014 01:43    Microbiology: No results found for  this or any previous visit (from the past 240 hour(s)).   Labs: Basic Metabolic Panel:  Recent Labs Lab 12/18/14 0042 12/18/14 0105 12/18/14 0500  NA 143 143 142  K 3.8 3.8 3.7  CL 110 108 110  CO2 22  --  26  GLUCOSE 126* 127* 134*  BUN 9 8 7   CREATININE 1.01 1.00 1.04  CALCIUM 9.2  --  9.0   Liver Function Tests:  Recent Labs Lab 12/18/14 0042 12/18/14 0500  AST 26 22  ALT 22 19  ALKPHOS 83 81  BILITOT 0.2* 0.3  PROT 6.3 5.9*  ALBUMIN 3.6 3.3*   No results for input(s): LIPASE, AMYLASE in the last 168 hours. No results for input(s): AMMONIA in the last 168 hours. CBC:  Recent Labs Lab 12/18/14 0042 12/18/14 0105 12/18/14 0500  WBC 5.6  --  4.8  NEUTROABS 3.3  --  2.8  HGB 10.7* 12.2 10.5*  HCT 32.6* 36.0 33.1*  MCV 86.0  --  86.2  PLT 290  --  255   Cardiac Enzymes:  Recent Labs Lab 12/18/14 0500  CKTOTAL 73   BNP: BNP (last 3 results) No results for input(s): PROBNP in the last 8760 hours. CBG:  Recent Labs Lab 12/18/14 0250 12/18/14 0826  GLUCAP 88 118*       Signed:  Lemma Tetro N  [facility] 12/18/2014, 11:57 AM

## 2014-12-18 NOTE — H&P (Signed)
[facility] History and Physical  Sherri [name] JXB:[id number] DOB: November 03, 1966 DOA: 12/18/2014  Referring physician: ER physician. PCP: Gwynneth AlimentSANDERS,ROBYN N, MD   Chief Complaint: Dizziness.  HPI: Sherri [name] is a 48 y.o. 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 Patient's symptoms started yesterday morning with patient getting dizzy on trying to walk with difficult gait. Patient also has been talking repeatedly same sentences as witness per patient's daughter. Denies any loss of consciousness. 3 days ago patient had a fall and hit her head but at the time did not lose consciousness. Patient states she fell after tripping. She denies any palpitations tinnitus difficulty hearing. Denies any weakness of the upper and lower extremities. In the ER CT head did not show anything acute and on-call neurologist Dr. Hosie PoissonSumner was consulted and at this time Dr. Hosie PoissonSumner has requested MRI brain and admit for observation and get physical therapy consult.   Review of Systems: As presented in the history of presenting illness, rest negative.  Past Medical History  Diagnosis Date  . Pernicious anemia 10/30/2011  . Lumbar herniated disc   . Environmental allergies   . Angioedema   . DVT (deep venous thrombosis)   . Asthma   . Seasonal allergies   . Urticaria   . Chronic back pain   . Anxiety   . Depression   . GERD (gastroesophageal reflux disease)   . Arthritis   . Chronic kidney disease   . Peripheral vascular disease   . CAP (community acquired pneumonia) 08/29/2012  . Stiff person syndrome   . Hiatal hernia 03/21/2013   Past Surgical History  Procedure Laterality Date  . Inguinal hernia 1983  1983   Social History:  reports that she has never smoked. She has never used smokeless tobacco. She reports that she drinks alcohol. She reports that she does not use illicit drugs. Where does patient live  home. Can patient participate in ADLs? Yes.  Allergies  Allergen Reactions  . Ibuprofen Other (See Comments)    Does not take due to hx of renal insufficiency  . Amoxicillin Diarrhea    Family History:  Family History  Problem Relation Age of Onset  . Cancer Mother   . Other Mother   . COPD Father   . Asthma Brother   . Cancer Brother     colon  . Heart attack Brother   . Seizures Brother       Prior to Admission medications   Medication Sig Start Date End Date Taking? Authorizing Provider  albuterol (PROVENTIL HFA;VENTOLIN HFA) 108 (90 BASE) MCG/ACT inhaler Inhale 1 puff into the lungs every 6 (six) hours as needed. PRO AIR 06/20/13  Yes Waymon Budgelinton D Young, MD  baclofen (LIORESAL) 20 MG tablet 20 mg 3 (three) times daily.  10/19/12  Yes Historical Provider, MD  cyanocobalamin (,VITAMIN B-12,) 1000 MCG/ML injection Inject 1,000 mcg into the muscle every 3 (three) months.    Yes Historical Provider, MD  cyclobenzaprine (FLEXERIL) 10 MG tablet Take 10 mg by mouth 3 (three) times daily as needed for muscle spasms.  10/21/12  Yes Historical Provider, MD  desloratadine (CLARINEX) 5 MG tablet Take 1 tablet (5 mg total) by mouth daily. 11/06/11  Yes Waymon Budgelinton D Young, MD  diazepam (VALIUM) 10 MG tablet One tablet five times a day Patient taking differently: Take 5-10 mg by mouth 3 (three) times daily. One tablet five times a day 11/02/14  Yes York Spanielharles K Willis, MD  gabapentin (NEURONTIN) 300 MG capsule Take 300 mg by mouth 3 (three) times daily.    Yes Historical Provider, MD  HYDROcodone-acetaminophen (NORCO) 7.5-325 MG per tablet Take 1 tablet by mouth every 6 (six) hours as needed for moderate pain or severe pain. 10/02/14  Yes Ozella Rocksavid J Merrell, MD  levocetirizine (XYZAL) 5 MG tablet Take 1 tablet (5 mg total) by mouth every evening. 11/06/11  Yes Waymon Budgelinton D Young, MD  lidocaine (LIDODERM) 5 % Place 1 patch onto the skin daily. Remove & Discard patch within 12 hours or as directed by MD 10/02/14   Yes Ozella Rocksavid J Merrell, MD  loratadine (CLARITIN) 10 MG tablet Take 10 mg by mouth 2 (two) times daily.   Yes Historical Provider, MD  meloxicam (MOBIC) 7.5 MG tablet Take 15 mg by mouth daily. 10/14/14  Yes Historical Provider, MD  montelukast (SINGULAIR) 10 MG tablet Take by mouth.    Yes Historical Provider, MD  omeprazole (PRILOSEC) 40 MG capsule Take 40 mg by mouth daily.   Yes Historical Provider, MD  cholecalciferol (VITAMIN D) 1000 UNITS tablet Take 1,000 Units by mouth daily.    Historical Provider, MD  diclofenac sodium (VOLTAREN) 1 % GEL Apply 2 g topically 4 (four) times daily. 10/02/14   Ozella Rocksavid J Merrell, MD  EPIPEN 2-PAK 0.3 MG/0.3ML SOAJ injection USE AS DIRECTED FOR  SEVERE  ALLERGIC  REACTIONS    Waymon Budgelinton D Young, MD  Fluticasone-Salmeterol (ADVAIR) 250-50 MCG/DOSE AEPB Inhale 1 puff into the lungs 2 (two) times daily.  03/14/12   Waymon Budgelinton D Young, MD    Physical Exam: Filed Vitals:   12/18/14 0200 12/18/14 0215 12/18/14 0300 12/18/14 0357  BP: 115/71 118/78 131/97 121/67  Pulse: 81 81 75 72  Temp:      TempSrc:      Resp: 16 13 15 14   Height:      Weight:      SpO2: 100% 100% 100% 100%     General:  Moderately built and nourished.  Eyes: Anicteric no pallor.  ENT: Mild bruise on the forehead from recent fall.  Neck: No neck rigidity.  Cardiovascular: S1 and S2 heard.  Respiratory: No rhonchi or crepitations.  Abdomen: Soft nontender bowel sounds present. No guarding or rigidity.  Skin: No rash.  Musculoskeletal: No edema.  Psychiatric: Appears normal.  Neurologic: Alert awake oriented to time place and person. Moves all extremities 5 x 5. No facial asymmetry. Tongue is midline. PERRLA positive.  Labs on Admission:  Basic Metabolic Panel:  Recent Labs Lab 12/18/14 0042 12/18/14 0105  NA 143 143  K 3.8 3.8  CL 110 108  CO2 22  --   GLUCOSE 126* 127*  BUN 9 8  CREATININE 1.01 1.00  CALCIUM 9.2  --    Liver Function Tests:  Recent Labs Lab  12/18/14 0042  AST 26  ALT 22  ALKPHOS 83  BILITOT 0.2*  PROT 6.3  ALBUMIN 3.6   No results for input(s): LIPASE, AMYLASE in the last 168 hours. No results for input(s): AMMONIA in the last 168 hours. CBC:  Recent Labs Lab 12/18/14 0042 12/18/14 0105  WBC 5.6  --   NEUTROABS 3.3  --   HGB 10.7* 12.2  HCT 32.6* 36.0  MCV 86.0  --   PLT 290  --    Cardiac Enzymes: No results for input(s): CKTOTAL, CKMB, CKMBINDEX, TROPONINI in the last 168 hours.  BNP (last 3 results) No results for  input(s): PROBNP in the last 8760 hours. CBG:  Recent Labs Lab 12/18/14 0250  GLUCAP 88    Radiological Exams on Admission: Ct Head Wo Contrast  12/18/2014   CLINICAL DATA:  Status post fall; hit right side of head, near right eye. Memory loss and altered mental status, acute onset. Initial encounter.  EXAM: CT HEAD WITHOUT CONTRAST  TECHNIQUE: Contiguous axial images were obtained from the base of the skull through the vertex without intravenous contrast.  COMPARISON:  CT of the head performed 01/03/2012, and MRI of the brain performed 09/03/2012  FINDINGS: There is no evidence of acute infarction, mass lesion, or intra- or extra-axial hemorrhage on CT.  Minimal subcortical white matter change likely reflects small vessel ischemic microangiopathy.  The posterior fossa, including the cerebellum, brainstem and fourth ventricle, is within normal limits. The third and lateral ventricles, and basal ganglia are unremarkable in appearance. The cerebral hemispheres are symmetric in appearance, with normal gray-white differentiation. No mass effect or midline shift is seen.  There is no evidence of fracture; visualized osseous structures are unremarkable in appearance. The visualized portions of the orbits are within normal limits. The paranasal sinuses and mastoid air cells are well-aerated. No significant soft tissue abnormalities are seen.  IMPRESSION: 1. No evidence of traumatic intracranial injury or  fracture. 2. Minimal small vessel ischemic microangiopathy.   Electronically Signed   By: Roanna Raider M.D.   On: 12/18/2014 01:43    EKG: Independently reviewed. Normal sinus rhythm.  Assessment/Plan Principal Problem:   Dizziness Active Problems:   Asthma with bronchitis   Pernicious anemia   Stiff person syndrome   1. Dizziness - at this time neurologist has recommended MRI of brain. If MRI brain is positive then further stroke workup. Get physical therapy consult. Check TSH and B-12 levels as requested by neurologist. 2. Asthma - presently not wheezing continue home inhalers. 3. Stiff person syndrome - continue present medications. Check CK levels. Since patient has had significant rhabdomyolysis during last admission. 4. Pernicious anemia on B-12 replacement therapy. 5. Previous history of DVT.   DVT Prophylaxis Lovenox.  Code Status: Full code.  Family Communication: Family at the bedside.  Disposition Plan: Admit for observation.    [facility] Pager [phone].  If 7PM-7AM, please contact night-coverage [email or web address] Password [facility] 12/18/2014, 4:32 AM

## 2014-12-18 NOTE — Progress Notes (Signed)
Report received from Melanie, RN

## 2014-12-18 NOTE — Progress Notes (Signed)
UR completed 

## 2014-12-18 NOTE — ED Provider Notes (Signed)
CSN: [id number]     Arrival date & time 12/18/14  0020 History  This chart was scribed for Tomasita CrumbleAdeleke Gracelyn Coventry, MD by Richarda Overlieichard Holland, ED Scribe. This patient was seen in room D34C/D34C and the patient's care was started 12:53 AM.     Chief Complaint  Patient presents with  . Dizziness  . Altered Mental Status   Patient is a 48 y.o. female presenting with altered mental status. The history is provided by the patient. No language interpreter was used.  Altered Mental Status Associated symptoms: light-headedness and weakness    HPI Comments: Sherri SlimmerSamantha [name] is a 48 y.o. female who presents to the Emergency Department complaining of dizziness that started today. Pt reports she fell 3 days ago because she thinks she tripped on something then hit her head when she fell. Pt states she has a knot on the front of her head which presented today and says she thinks it is from her fall. She denies LOC. She states she did no go to the hospital after her fall. Pt states that she could not hold a cup earlier today because both of her hands were shaking. She says her hands started getting weak yesterday. She states she does not have this weakness in her legs. Pt describes the dizziness as a light headedness. Her sister says that pt started repeating things over and over earlier and possibly have slurred speech. Her sister states she normally walks with a cane but says pt looked wobbly walking to ED. Denies visual disturbance. She states she is not on blood pressure medication. She denies high cholesterol and diabetes as well. Patient has no further complaints.    Past Medical History  Diagnosis Date  . Pernicious anemia 10/30/2011  . Lumbar herniated disc   . Environmental allergies   . Angioedema   . DVT (deep venous thrombosis)   . Asthma   . Seasonal allergies   . Urticaria   . Chronic back pain   . Anxiety   . Depression   . GERD (gastroesophageal reflux disease)   . Arthritis   . Chronic kidney disease    . Peripheral vascular disease   . CAP (community acquired pneumonia) 08/29/2012  . Stiff person syndrome   . Hiatal hernia 03/21/2013   Past Surgical History  Procedure Laterality Date  . Inguinal hernia 1983  1983   Family History  Problem Relation Age of Onset  . Cancer Mother   . Other Mother   . COPD Father   . Asthma Brother   . Cancer Brother     colon  . Heart attack Brother   . Seizures Brother    History  Substance Use Topics  . Smoking status: Never Smoker   . Smokeless tobacco: Never Used     Comment: Never Used Tobacco  . Alcohol Use: 0.0 oz/week    0 Not specified per week     Comment: Occasional   OB History    No data available     Review of Systems  Eyes: Negative for visual disturbance.  Neurological: Positive for dizziness, weakness and light-headedness. Negative for syncope.  All other systems reviewed and are negative.   Allergies  Amoxicillin and Ibuprofen  Home Medications   Prior to Admission medications   Medication Sig Start Date End Date Taking? Authorizing Provider  albuterol (PROVENTIL HFA;VENTOLIN HFA) 108 (90 BASE) MCG/ACT inhaler Inhale 1 puff into the lungs every 6 (six) hours as needed. PRO AIR 06/20/13   Clinton D  Young, MD  baclofen (LIORESAL) 20 MG tablet 20 mg 3 (three) times daily.  10/19/12   Historical Provider, MD  cholecalciferol (VITAMIN D) 1000 UNITS tablet Take 1,000 Units by mouth daily.    Historical Provider, MD  cyanocobalamin (,VITAMIN B-12,) 1000 MCG/ML injection Inject 1,000 mcg into the muscle every 3 (three) months.     Historical Provider, MD  cyclobenzaprine (FLEXERIL) 10 MG tablet 10 mg at bedtime.  10/21/12   Historical Provider, MD  desloratadine (CLARINEX) 5 MG tablet Take 1 tablet (5 mg total) by mouth daily. 11/06/11   Waymon Budgelinton D Young, MD  diazepam (VALIUM) 10 MG tablet One tablet five times a day 11/02/14   York Spanielharles K Willis, MD  diclofenac sodium (VOLTAREN) 1 % GEL Apply 2 g topically 4 (four) times  daily. 10/02/14   Ozella Rocksavid J Merrell, MD  EPIPEN 2-PAK 0.3 MG/0.3ML SOAJ injection USE AS DIRECTED FOR  SEVERE  ALLERGIC  REACTIONS    Waymon Budgelinton D Young, MD  Fluticasone-Salmeterol (ADVAIR) 250-50 MCG/DOSE AEPB Inhale into the lungs.  03/14/12   Waymon Budgelinton D Young, MD  gabapentin (NEURONTIN) 300 MG capsule Take 300 mg by mouth 3 (three) times daily.     Historical Provider, MD  HYDROcodone-acetaminophen (NORCO) 7.5-325 MG per tablet Take 1 tablet by mouth every 6 (six) hours as needed for moderate pain or severe pain. Patient not taking: Reported on 12/09/2014 10/02/14   Ozella Rocksavid J Merrell, MD  levocetirizine (XYZAL) 5 MG tablet Take 1 tablet (5 mg total) by mouth every evening. 11/06/11   Waymon Budgelinton D Young, MD  lidocaine (LIDODERM) 5 % Place 1 patch onto the skin daily. Remove & Discard patch within 12 hours or as directed by MD 10/02/14   Ozella Rocksavid J Merrell, MD  loratadine (CLARITIN) 10 MG tablet Take 10 mg by mouth 2 (two) times daily.    Historical Provider, MD  montelukast (SINGULAIR) 10 MG tablet Take by mouth.     Historical Provider, MD  omeprazole (PRILOSEC) 40 MG capsule Take 40 mg by mouth daily.    Historical Provider, MD   BP 125/78 mmHg  Pulse 106  Temp(Src) 98.7 F (37.1 C) (Oral)  Resp 18  Ht 5\' 4"  (1.626 m)  Wt 159 lb (72.122 kg)  BMI 27.28 kg/m2  SpO2 92% Physical Exam  Constitutional: She is oriented to person, place, and time. She appears well-developed and well-nourished. No distress.  HENT:  Head: Normocephalic.  Left frontal abrasion, 1 cm. There is erythema and mild swelling.  Eyes: Conjunctivae and EOM are normal. Pupils are equal, round, and reactive to light. No scleral icterus.  Neck: Normal range of motion. Neck supple. No JVD present. No tracheal deviation present. No thyromegaly present.  Cardiovascular: Normal rate, regular rhythm and normal heart sounds.  Exam reveals no gallop and no friction rub.   No murmur heard. Pulmonary/Chest: Effort normal and breath sounds normal.  No respiratory distress. She has no wheezes. She exhibits no tenderness.  Abdominal: Soft. Bowel sounds are normal. She exhibits no distension and no mass. There is no tenderness. There is no rebound and no guarding.  Musculoskeletal: Normal range of motion. She exhibits no edema or tenderness.  Lymphadenopathy:    She has no cervical adenopathy.  Neurological: She is alert and oriented to person, place, and time. No cranial nerve deficit. She exhibits normal muscle tone.  Normal strength 4 extremities. Patient reports decreased sensation in her bilateral upper extremities. Normal sensation in the lower extremities. Cerebellar testing is normal, gait  is ataxic.  Skin: Skin is warm and dry. No rash noted. No erythema. No pallor.  Nursing note and vitals reviewed.   ED Course  Procedures   DIAGNOSTIC STUDIES: Oxygen Saturation is 92% on RA, normal by my interpretation.    COORDINATION OF CARE: 1:01 AM Discussed treatment plan with pt at bedside and pt agreed to plan.   Labs Review Labs Reviewed  CBC - Abnormal; Notable for the following:    RBC 3.79 (*)    Hemoglobin 10.7 (*)    HCT 32.6 (*)    All other components within normal limits  COMPREHENSIVE METABOLIC PANEL - Abnormal; Notable for the following:    Glucose, Bld 126 (*)    Total Bilirubin 0.2 (*)    GFR calc non Af Amer 64 (*)    GFR calc Af Amer 74 (*)    All other components within normal limits  I-STAT CHEM 8, ED - Abnormal; Notable for the following:    Glucose, Bld 127 (*)    All other components within normal limits  ETHANOL  PROTIME-INR  APTT  DIFFERENTIAL  URINALYSIS, ROUTINE W REFLEX MICROSCOPIC  URINE RAPID DRUG SCREEN (HOSP PERFORMED)  CBG MONITORING, ED  I-STAT TROPOININ, ED  I-STAT TROPOININ, ED    Imaging Review Ct Head Wo Contrast  12/18/2014   CLINICAL DATA:  Status post fall; hit right side of head, near right eye. Memory loss and altered mental status, acute onset. Initial encounter.  EXAM:  CT HEAD WITHOUT CONTRAST  TECHNIQUE: Contiguous axial images were obtained from the base of the skull through the vertex without intravenous contrast.  COMPARISON:  CT of the head performed 01/03/2012, and MRI of the brain performed 09/03/2012  FINDINGS: There is no evidence of acute infarction, mass lesion, or intra- or extra-axial hemorrhage on CT.  Minimal subcortical white matter change likely reflects small vessel ischemic microangiopathy.  The posterior fossa, including the cerebellum, brainstem and fourth ventricle, is within normal limits. The third and lateral ventricles, and basal ganglia are unremarkable in appearance. The cerebral hemispheres are symmetric in appearance, with normal gray-white differentiation. No mass effect or midline shift is seen.  There is no evidence of fracture; visualized osseous structures are unremarkable in appearance. The visualized portions of the orbits are within normal limits. The paranasal sinuses and mastoid air cells are well-aerated. No significant soft tissue abnormalities are seen.  IMPRESSION: 1. No evidence of traumatic intracranial injury or fracture. 2. Minimal small vessel ischemic microangiopathy.   Electronically Signed   By: Roanna Raider M.D.   On: 12/18/2014 01:43     EKG Interpretation None      MDM   Final diagnoses:  None    Patient since emergency department for dizziness and weakness of the upper extremities. Terms could be consistent with a stroke, she is outside the window. CT scan was a medially ordered a neurology consultation was obtained. Currently awaiting laboratory studies.   Neurology consult obtained, I appreciate recommendations. He recommends for MRI and inpatient setting.  Patient will be admitted to [facility] for [facility] care. Laboratory studies not reveal any cause for her weakness, infectious workup is negative.   I personally performed the services described in this documentation, which was scribed in my  presence. The recorded information has been reviewed and is accurate.      Tomasita Crumble, MD 12/18/14 [phone]

## 2014-12-18 NOTE — Progress Notes (Signed)
Subjective: Patient feels back to baseline.   She states taht she fell Tuesday, and hit her head. She "blacked out" momentarily. She has some soft tissue sewlling over the left frontal region as a result. Wednesday, she was very sleepy and slept much of the day. Thursdayawas similar and it is this day that she was noticed to be repeating herself and be off balance.   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 I suspect tha tthe sleepiness, unsteadiness and mild confusion are post-concussive syndrome. With a  Fall with even brief LOC, I think a concussion is possible. With normal CT treatment would be supportive. With return to baseline, as well as no clearly focal symptoms, I think stroke is very unlikely and do not think MRI is needed.   Recommendations: 1) No further testing needed.  2) please call with any further questions or concerns.   Ritta SlotMcNeill Amaryah Mallen, MD [facility] [phone]  If 7pm- 7am, please page neurology on call as listed in AMION.

## 2014-12-18 NOTE — Consult Note (Signed)
Stroke Consult    Chief Complaint: dizziness, altered mental status  HPI: Sherri [name] is an 48 y.o. female who presents to the Emergency Department complaining of dizziness that started yesterday though she reports having a fall 3 days ago. Describes dizziness as feeling "off balance", denies any vertigo. Today also noted transient difficulty using her hands, felt weak and dropped things (unsure if unilateral). Her sister notes that today she had a transient episode of speech difficulty. She notes patients had trouble getting her words out, kept repeating the same thing over and over again.   She has a diagnosis of Stiff person syndrome, [facility]. Treated with diazepam and baclofen. Walks with a cane at baseline. Her sister reports she appears more unsteady today.   Head CT in the ED completed, imaging reviewed, shows no acute infarct.   Date last known well: 12/17/2014 Time last known well: 0800 tPA Given: no, out of window, mildness of symptoms  Past Medical History  Diagnosis Date  . Pernicious anemia 10/30/2011  . Lumbar herniated disc   . Environmental allergies   . Angioedema   . DVT (deep venous thrombosis)   . Asthma   . Seasonal allergies   . Urticaria   . Chronic back pain   . Anxiety   . Depression   . GERD (gastroesophageal reflux disease)   . Arthritis   . Chronic kidney disease   . Peripheral vascular disease   . CAP (community acquired pneumonia) 08/29/2012  . Stiff person syndrome   . Hiatal hernia 03/21/2013    Past Surgical History  Procedure Laterality Date  . Inguinal hernia 1983  1983    Family History  Problem Relation Age of Onset  . Cancer Mother   . Other Mother   . COPD Father   . Asthma Brother   . Cancer Brother     colon  . Heart attack Brother   . Seizures Brother    Social History:  reports that she has never smoked. She has never used smokeless tobacco. She reports that she drinks alcohol. She reports that she  does not use illicit drugs.  Allergies:  Allergies  Allergen Reactions  . Amoxicillin Diarrhea  . Ibuprofen     Does not take due to hx of renal insufficiency     (Not in a hospital admission)  ROS: Out of a complete 14 system review, the patient complains of only the following symptoms, and all other reviewed systems are negative. +gait instability  Physical Examination: Filed Vitals:   12/18/14 0030  BP: 125/78  Pulse: 106  Temp: 98.7 F (37.1 C)  Resp: 18   Physical Exam  Constitutional: He appears well-developed and well-nourished.  Psych: Affect appropriate to situation Eyes: No scleral injection HENT: No OP obstrucion Head: Normocephalic.  Cardiovascular: Normal rate and regular rhythm.  Respiratory: Effort normal and breath sounds normal.  GI: Soft. Bowel sounds are normal. No distension. There is no tenderness.  Skin: WDI   Neurologic Examination: Mental Status: Alert, oriented, thought content appropriate.  Speech fluent without evidence of aphasia.  Able to follow 3 step commands without difficulty. Cranial Nerves: II: funduscopic exam wnl bilaterally, visual fields grossly normal, pupils equal, round, reactive to light and accommodation III,IV, VI: ptosis not present, extra-ocular motions intact bilaterally V,VII: smile symmetric, facial light touch sensation normal bilaterally VIII: hearing normal bilaterally IX,X: gag reflex present XI: trapezius strength/neck flexion strength normal bilaterally XII: tongue strength normal  Motor: Right :  Upper extremity    Left:     Upper extremity 5/5 deltoid       5/5 deltoid 5/5 biceps      5/5 biceps  5/5 triceps      5/5 triceps 5/5 hand grip      5/5 hand grip  Lower extremity     Lower extremity 5/5 hip flexor      5/5 hip flexor 5/5 quadricep      5/5 quadriceps  5/5 hamstrings     5/5 hamstrings 5/5 plantar flexion       5/5 plantar flexion 5/5 plantar extension     5/5 plantar extension Tone  and bulk:normal tone throughout; no atrophy noted Sensory: Pinprick and light touch intact throughout, bilaterally Deep Tendon Reflexes: 2+ and symmetric throughout Plantars: Right: downgoing   Left: downgoing Cerebellar: normal finger-to-nose, normal rapid alternating movements and normal heel-to-shin test Gait: stands slowly without assistance. Slightly wide based. Able to walk without a cane though unsteady and falls backwards. Wobbles with eyes open and feet together.   Laboratory Studies:   Basic Metabolic Panel: No results for input(s): NA, K, CL, CO2, GLUCOSE, BUN, CREATININE, CALCIUM, MG, PHOS in the last 168 hours.  Liver Function Tests: No results for input(s): AST, ALT, ALKPHOS, BILITOT, PROT, ALBUMIN in the last 168 hours. No results for input(s): LIPASE, AMYLASE in the last 168 hours. No results for input(s): AMMONIA in the last 168 hours.  CBC: No results for input(s): WBC, NEUTROABS, HGB, HCT, MCV, PLT in the last 168 hours.  Cardiac Enzymes: No results for input(s): CKTOTAL, CKMB, CKMBINDEX, TROPONINI in the last 168 hours.  BNP: Invalid input(s): POCBNP  CBG: No results for input(s): GLUCAP in the last 168 hours.  Microbiology: Results for orders placed or performed during the hospital encounter of 08/27/12  Urine culture     Status: None   Collection Time: 08/27/12  2:33 PM  Result Value Ref Range Status   Specimen Description URINE, CATHETERIZED  Final   Special Requests ADDED 08/28/12 2013  Final   Culture  Setup Time 08/28/2012 21:20  Final   Colony Count NO GROWTH  Final   Culture NO GROWTH  Final   Report Status 08/29/2012 FINAL  Final  MRSA PCR Screening     Status: None   Collection Time: 08/30/12  5:22 PM  Result Value Ref Range Status   MRSA by PCR NEGATIVE NEGATIVE Final    Comment:        The GeneXpert MRSA Assay (FDA approved for NASAL specimens only), is one component of a comprehensive MRSA colonization surveillance program. It is  not intended to diagnose MRSA infection nor to guide or monitor treatment for MRSA infections.    Coagulation Studies: No results for input(s): LABPROT, INR in the last 72 hours.  Urinalysis: No results for input(s): COLORURINE, LABSPEC, PHURINE, GLUCOSEU, HGBUR, BILIRUBINUR, KETONESUR, PROTEINUR, UROBILINOGEN, NITRITE, LEUKOCYTESUR in the last 168 hours.  Invalid input(s): APPERANCEUR  Lipid Panel:  No results found for: CHOL, TRIG, HDL, CHOLHDL, VLDL, LDLCALC  HgbA1C:  Lab Results  Component Value Date   HGBA1C 5.3 09/03/2012    Urine Drug Screen:  No results found for: LABOPIA, COCAINSCRNUR, LABBENZ, AMPHETMU, THCU, LABBARB  Alcohol Level: No results for input(s): ETH in the last 168 hours.  Other results:  Imaging: No results found.  Assessment: 48 y.o. female with history of Stiff Person syndrome presenting for evaluation of gait instability and transient episodes of confusion and hand weakness/shaking. Hand  symptoms apparently involved bilateral hands. New onset persistent gait instability with transient speech and motor symptoms raises question of possible ischemic event though clinical description is not typical. She does have continued gait instability and is a fall risk. Would admit for further workup and PT evaluation.    Plan: 1. MRI brain. If positive would complete stroke workup 2. Check orthostatic vitals 3. Check B12, TSH 4. PT evaluation    Elspeth Cho, DO [facility] [phone]  If 7pm- 7am, please page neurology on call as listed in AMION. 12/18/2014, 1:14 AM

## 2014-12-18 NOTE — ED Notes (Signed)
Patient reports she fell on Tuesday.  She does not know how or why she fell.  Patient denies loc.  She states she has a knot on the front of her head.  She also had swelling to the right side of her face.  Patient daughter states patient has been repeating herself all day today.  She is dizzy and reports popping in her ears.  Patient has been taking hydrocodone for pain, fall with back injury.  Patient last took hydrocodone today at 1700.  Patient took valium tonight at [employer]

## 2014-12-18 NOTE — ED Notes (Signed)
Dr Ranae PalmsYelverton gave permission to order CT scan

## 2014-12-18 NOTE — Progress Notes (Signed)
12/18/14 patient going home today, IV site removed, and discharge instructiond reviewed with patient.
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
## 2014-12-30 ENCOUNTER — Encounter: Payer: Self-pay | Admitting: Neurology

## 2014-12-30 ENCOUNTER — Ambulatory Visit (INDEPENDENT_AMBULATORY_CARE_PROVIDER_SITE_OTHER): Payer: Medicare Other | Admitting: Neurology

## 2014-12-30 VITALS — BP 112/75 | HR 91 | Ht 63.0 in | Wt 158.6 lb

## 2014-12-30 DIAGNOSIS — G2582 Stiff-man syndrome: Secondary | ICD-10-CM

## 2014-12-30 DIAGNOSIS — F0781 Postconcussional syndrome: Secondary | ICD-10-CM

## 2014-12-30 HISTORY — DX: Postconcussional syndrome: F07.81

## 2014-12-30 NOTE — Progress Notes (Addendum)
Reason for visit: Concussion  Sherri [name] is an 49 y.o. female  History of present illness:  [name] is a 49 year old right-handed black female with a history of stiff person syndrome associated with rhabdomyolysis in the past. The patient has done well on the combination baclofen and diazepam. She unfortunately had a fall just prior to Christmas of 2015, and she struck her head associated with several seconds of amnesia. The patient had gait instability, but a CT scan of the head did not show any traumatic changes of the brain. Urine drug screen was positive for cocaine, the patient indicates that she does not abuse cocaine. The patient is feeling better now, her gait stability has improved, she does not have headaches or neck pain. She is at or near her usual baseline. Previously, the patient was somewhat confused following the head injury.  Past Medical History  Diagnosis Date  . Pernicious anemia 10/30/2011  . Lumbar herniated disc   . Environmental allergies   . Angioedema   . DVT (deep venous thrombosis)   . Asthma   . Seasonal allergies   . Urticaria   . Chronic back pain   . Anxiety   . Depression   . GERD (gastroesophageal reflux disease)   . Arthritis   . Chronic kidney disease   . Peripheral vascular disease   . CAP (community acquired pneumonia) 08/29/2012  . Stiff person syndrome   . Hiatal hernia 03/21/2013  . Cocaine abuse   . Postconcussion syndrome 12/30/2014    Past Surgical History  Procedure Laterality Date  . Inguinal hernia 1983  1983    Family History  Problem Relation Age of Onset  . Cancer Mother   . Other Mother   . COPD Father   . Asthma Brother   . Cancer Brother     colon  . Heart attack Brother   . Seizures Brother     Social history:  reports that she has never smoked. She has never used smokeless tobacco. She reports that she drinks alcohol. She reports that she does not use illicit drugs.    Allergies  Allergen Reactions  .  Ibuprofen Other (See Comments)    Does not take due to hx of renal insufficiency  . Amoxicillin Diarrhea    Medications:  Current Outpatient Prescriptions on File Prior to Visit  Medication Sig Dispense Refill  . albuterol (PROVENTIL HFA;VENTOLIN HFA) 108 (90 BASE) MCG/ACT inhaler Inhale 1 puff into the lungs every 6 (six) hours as needed. PRO AIR    . baclofen (LIORESAL) 20 MG tablet 20 mg 3 (three) times daily.     . cholecalciferol (VITAMIN D) 1000 UNITS tablet Take 1,000 Units by mouth daily.    . cyanocobalamin (,VITAMIN B-12,) 1000 MCG/ML injection Inject 1,000 mcg into the muscle every 3 (three) months.     . cyclobenzaprine (FLEXERIL) 10 MG tablet Take 10 mg by mouth 3 (three) times daily as needed for muscle spasms.     [name]. desloratadine (CLARINEX) 5 MG tablet Take 1 tablet (5 mg total) by mouth daily. 30 tablet 0  . diazepam (VALIUM) 10 MG tablet One tablet five times a day (Patient taking differently: Take 5-10 mg by mouth 3 (three) times daily. One tablet five times a day) 450 tablet 1  . diclofenac sodium (VOLTAREN) 1 % GEL Apply 2 g topically 4 (four) times daily. (Patient taking differently: Apply 2 g topically as needed. ) 1 Tube 0  . EPIPEN 2-PAK 0.3  MG/0.3ML SOAJ injection USE AS DIRECTED FOR  SEVERE  ALLERGIC  REACTIONS 1 Device 11  . Fluticasone-Salmeterol (ADVAIR) 250-50 MCG/DOSE AEPB Inhale 1 puff into the lungs 2 (two) times daily.     [name] gabapentin (NEURONTIN) 300 MG capsule Take 300 mg by mouth 3 (three) times daily.     [name] HYDROcodone-acetaminophen (NORCO) 7.5-325 MG per tablet Take 1 tablet by mouth every 6 (six) hours as needed for moderate pain or severe pain. 15 tablet 0  . levocetirizine (XYZAL) 5 MG tablet Take 1 tablet (5 mg total) by mouth every evening. 30 tablet 0  . lidocaine (LIDODERM) 5 % Place 1 patch onto the skin daily. Remove & Discard patch within 12 hours or as directed by MD 30 patch 0  . loratadine (CLARITIN) 10 MG tablet Take 10 mg by mouth 2 (two)  times daily.    . meloxicam (MOBIC) 7.5 MG tablet Take 15 mg by mouth daily.    . montelukast (SINGULAIR) 10 MG tablet Take by mouth.     [name] omeprazole (PRILOSEC) 40 MG capsule Take 40 mg by mouth daily.     No current facility-administered medications on file prior to visit.    ROS:  Out of a complete 14 system review of symptoms, the patient complains only of the following symptoms, and all other reviewed systems are negative.  Wheezing Heart murmur Environmental allergies Back pain Anemia Dizziness  Blood pressure 112/75, pulse 91, height  (1.6 m), weight 158 lb 9.6 oz (71.94 kg).  Physical Exam  General: The patient is alert and cooperative at the time of the examination.  Skin: No significant peripheral edema is noted.   Neurologic Exam  Mental status: The patient is oriented x 3.  Cranial nerves: Facial symmetry is present. Speech is normal, no aphasia or dysarthria is noted. Extraocular movements are full. Visual fields are full.  Motor: The patient has good strength in all 4 extremities.  Sensory examination: Soft touch sensation is symmetric on the face, arms, and legs.  Coordination: The patient has good finger-nose-finger and heel-to-shin bilaterally.  Gait and station: The patient has a normal gait. Tandem gait is normal. Romberg is negative. No drift is seen.  Reflexes: Deep tendon reflexes are symmetric.   Assessment/Plan:  1. Stiff person syndrome  2. Post concussive syndrome  3. Urine drug screen positive for cocaine  The patient is doing better with her postconcussive syndrome currently. She has been well controlled on baclofen and diazepam for her stiff person syndrome, these medications will be continued. She will follow-up in about 6 months. She will call if she has any further problems.  Marlan Palau MD 12/30/2014 6:55 PM  [facility] [address] [place], [place] [address]  Phone [phone]  Fax [phone]

## 2014-12-30 NOTE — Patient Instructions (Signed)
Post-Concussion Syndrome Post-concussion syndrome describes the symptoms that can occur after a head injury. These symptoms can last from weeks to months. CAUSES  It is not clear why some head injuries cause post-concussion syndrome. It can occur whether your head injury was mild or severe and whether you were wearing head protection or not.  SIGNS AND SYMPTOMS  Memory difficulties.  Dizziness.  Headaches.  Double vision or blurry vision.  Sensitivity to light.  Hearing difficulties.  Depression.  Tiredness.  Weakness.  Difficulty with concentration.  Difficulty sleeping or staying asleep.  Vomiting.  Poor balance or instability on your feet.  Slow reaction time.  Difficulty learning and remembering things you have heard. DIAGNOSIS  There is no test to determine whether you have post-concussion syndrome. Your health care provider may order an imaging scan of your brain, such as a CT scan, to check for other problems that may be causing your symptoms (such as severe injury inside your skull). TREATMENT  Usually, these problems disappear over time without medical care. Your health care provider may prescribe medicine to help ease your symptoms. It is important to follow up with a neurologist to evaluate your recovery and address any lingering symptoms or issues. HOME CARE INSTRUCTIONS   Only take over-the-counter or prescription medicines for pain, discomfort, or fever as directed by your health care provider. Do not take aspirin. Aspirin can slow blood clotting.  Sleep with your head slightly elevated to help with headaches.  Avoid any situation where there is potential for another head injury (football, hockey, soccer, basketball, martial arts, downhill snow sports, and horseback riding). Your condition will get worse every time you experience a concussion. You should avoid these activities until you are evaluated by the appropriate follow-up health care  providers.  Keep all follow-up appointments as directed by your health care provider. SEEK IMMEDIATE MEDICAL CARE IF:  You develop confusion or unusual drowsiness.  You cannot wake the injured person.  You develop nausea or persistent, forceful vomiting.  You feel like you are moving when you are not (vertigo).  You notice the injured person's eyes moving rapidly back and forth. This may be a sign of vertigo.  You have convulsions or faint.  You have severe, persistent headaches that are not relieved by medicine.  You cannot use your arms or legs normally.  Your pupils change size.  You have clear or bloody discharge from the nose or ears.  Your problems are getting worse, not better. MAKE SURE YOU:  Understand these instructions.  Will watch your condition.  Will get help right away if you are not doing well or get worse. Document Released: 06/02/2002 Document Revised: 10/01/2013 Document Reviewed: 03/18/2014 ExitCare Patient Information 2015 [employer]. This information is not intended to replace advice given to you by your health care provider. Make sure you discuss any questions you have with your health care provider.  

## 2015-01-01 ENCOUNTER — Ambulatory Visit: Payer: Medicare Other | Admitting: Neurology

## 2015-01-08 DIAGNOSIS — G2582 Stiff-man syndrome: Secondary | ICD-10-CM | POA: Diagnosis not present

## 2015-01-08 DIAGNOSIS — M545 Low back pain: Secondary | ICD-10-CM | POA: Diagnosis not present

## 2015-01-08 DIAGNOSIS — M4180 Other forms of scoliosis, site unspecified: Secondary | ICD-10-CM | POA: Diagnosis not present

## 2015-01-25 ENCOUNTER — Telehealth: Payer: Self-pay | Admitting: Internal Medicine

## 2015-01-25 NOTE — Telephone Encounter (Signed)
See loratadine/ Claritin on her med list. Was she already taking it? If the benadryl cleared her temporarily, then we can try staying on antihistamines for next week or so-   Benadryl is ok if it works without too much sleepiness. Otherwise, suggest daily otc Zyrtec. If that isn't enough, we may need to try some prednisone.

## 2015-01-25 NOTE — Telephone Encounter (Signed)
Called and spoke to pt. Pt c/o hives on her BIL legs, chest, arms, and upper lip swollen x 1 day. Pt stated she took benadryl and it was helpful. Pt requesting recs by CY. Pt last seen in 06/25/2014 and was told to f/u as needed. Pt stated she was SOB on 01/24/14 but it was relieved by albuterol inhaler. Pt denies cough, CP/tightness, f/c/s.  Dr. Maple HudsonYoung please advise.   Allergies  Allergen Reactions  . Ibuprofen Other (See Comments)    Does not take due to hx of renal insufficiency  . Amoxicillin Diarrhea   Current Outpatient Prescriptions on File Prior to Visit  Medication Sig Dispense Refill  . albuterol (PROVENTIL HFA;VENTOLIN HFA) 108 (90 BASE) MCG/ACT inhaler Inhale 1 puff into the lungs every 6 (six) hours as needed. PRO AIR    . baclofen (LIORESAL) 20 MG tablet 20 mg 3 (three) times daily.     . cholecalciferol (VITAMIN D) 1000 UNITS tablet Take 1,000 Units by mouth daily.    . cyanocobalamin (,VITAMIN B-12,) 1000 MCG/ML injection Inject 1,000 mcg into the muscle every 3 (three) months.     . cyclobenzaprine (FLEXERIL) 10 MG tablet Take 10 mg by mouth 3 (three) times daily as needed for muscle spasms.     [name]. desloratadine (CLARINEX) 5 MG tablet Take 1 tablet (5 mg total) by mouth daily. 30 tablet 0  . diazepam (VALIUM) 10 MG tablet One tablet five times a day (Patient taking differently: Take 5-10 mg by mouth 3 (three) times daily. One tablet five times a day) 450 tablet 1  . diclofenac sodium (VOLTAREN) 1 % GEL Apply 2 g topically 4 (four) times daily. (Patient taking differently: Apply 2 g topically as needed. ) 1 Tube 0  . EPIPEN 2-PAK 0.3 MG/0.3ML SOAJ injection USE AS DIRECTED FOR  SEVERE  ALLERGIC  REACTIONS 1 Device 11  . Fluticasone-Salmeterol (ADVAIR) 250-50 MCG/DOSE AEPB Inhale 1 puff into the lungs 2 (two) times daily.     [name]. gabapentin (NEURONTIN) 300 MG capsule Take 300 mg by mouth 3 (three) times daily.     [name]. HYDROcodone-acetaminophen (NORCO) 7.5-325 MG per tablet Take 1 tablet  by mouth every 6 (six) hours as needed for moderate pain or severe pain. 15 tablet 0  . levocetirizine (XYZAL) 5 MG tablet Take 1 tablet (5 mg total) by mouth every evening. 30 tablet 0  . lidocaine (LIDODERM) 5 % Place 1 patch onto the skin daily. Remove & Discard patch within 12 hours or as directed by MD 30 patch 0  . loratadine (CLARITIN) 10 MG tablet Take 10 mg by mouth 2 (two) times daily.    . meloxicam (MOBIC) 7.5 MG tablet Take 15 mg by mouth daily.    . montelukast (SINGULAIR) 10 MG tablet Take by mouth.     [name]. omeprazole (PRILOSEC) 40 MG capsule Take 40 mg by mouth daily.     No current facility-administered medications on file prior to visit.

## 2015-01-25 NOTE — Telephone Encounter (Signed)
Called and spoke to pt. Pt stated she took Claritin this morning after getting off the phone earlier at 9:30. Pt stated her hives have stopped itching and has noticed they have started to disapate.  Informed pt of the recs per CY. Pt verbalized understanding and denied any further questions or concerns at this time. Pt aware to call back if hives come back or do not go away. Nothing further needed.

## 2015-02-17 ENCOUNTER — Telehealth: Payer: Self-pay | Admitting: *Deleted

## 2015-02-17 MED ORDER — DIAZEPAM 10 MG PO TABS: 20.0000 mg | ORAL_TABLET | Freq: Three times a day (TID) | ORAL | Status: DC

## 2015-02-17 NOTE — Telephone Encounter (Signed)
I called the patient. She is having increased stiffness in the back and legs. She is on 50 mg of diazepam daily, we will go up to 20 mg 3 times daily of this medication to see if the stiffness can be alleviated.

## 2015-02-17 NOTE — Telephone Encounter (Signed)
Patient is having a lot of pain and stiffness in her legs. Patient is using a cane but stills like she may fall. Please call patient and advise.

## 2015-02-22 ENCOUNTER — Telehealth: Payer: Self-pay | Admitting: Neurology

## 2015-02-22 NOTE — Telephone Encounter (Signed)
Pt is calling stating she needs to have her Rx for diazepam (VALIUM) 10 MG tablet [facility].  [facility]  It needs to be a 90 day supply. Please call and advise.

## 2015-02-22 NOTE — Telephone Encounter (Signed)
I called the patient. She just picked up a prescription for 90 days worth of medication at [facility]. She is to call us when the next prescription is due in 90 days, and remind us to call it into [facility] mail order.

## 2015-03-02 ENCOUNTER — Telehealth: Payer: Self-pay | Admitting: Internal Medicine

## 2015-03-02 NOTE — Telephone Encounter (Signed)
Spoke with pt. States that the hives she called us about last month are still present. They are located on her arms, thighs and abdomen. Has been using Benadryl and Benadryl cream with no relief. Would like CY's recs.  Allergies  Allergen Reactions  . Ibuprofen Other (See Comments)    Does not take due to hx of renal insufficiency  . Amoxicillin Diarrhea    CY - please advise. Thanks.

## 2015-03-02 NOTE — Telephone Encounter (Signed)
Per CY---  Zyrtec 10 mg daily pepcid 20 mg daily

## 2015-03-02 NOTE — Telephone Encounter (Signed)
Attempted to call pt. No answer, voicemail was full. Will need to try back later.

## 2015-03-03 DIAGNOSIS — F410 Panic disorder [episodic paroxysmal anxiety] without agoraphobia: Secondary | ICD-10-CM | POA: Diagnosis not present

## 2015-03-03 DIAGNOSIS — F411 Generalized anxiety disorder: Secondary | ICD-10-CM | POA: Diagnosis not present

## 2015-03-03 NOTE — Telephone Encounter (Signed)
Attempted to call pt. No answer, mailbox is full. Will try back. 

## 2015-03-03 NOTE — Telephone Encounter (Signed)
Pt returned call - she would a text back - [phone]

## 2015-03-03 NOTE — Telephone Encounter (Signed)
Attempted to call patient.  No answer, voicemail full.  Left number on beeper for her to call back.

## 2015-03-03 NOTE — Telephone Encounter (Signed)
Pt advised of rec's per CY Pt states that she is taking Omeprazole 40mg  daily currently - wants to know if she needs to continue taking Omeprazole with Pepcid? Pt states that she is taking a lot of allergy meds daily - Claritin 10mg  BID, Clarinex 5mg  qd, Xyzal at bedtime and Singulair daily. Pt states that she is not sure if she is supposed to be on all these but she does them everyday. Please advise Dr Maple HudsonYoung on patient's current meds and instruct how she needs to take rec meds with current meds listed. Thanks.    Allergies  Allergen Reactions  . Ibuprofen Other (See Comments)    Does not take due to hx of renal insufficiency  . Amoxicillin Diarrhea     Medication List       This list is accurate as of: 03/02/15 11:59 PM.  Always use your most recent med list.               albuterol 108 (90 BASE) MCG/ACT inhaler  Commonly known as:  PROVENTIL HFA;VENTOLIN HFA  Inhale 1 puff into the lungs every 6 (six) hours as needed. PRO AIR     baclofen 20 MG tablet  Commonly known as:  LIORESAL  20 mg 3 (three) times daily.     cholecalciferol 1000 UNITS tablet  Commonly known as:  VITAMIN D  Take 1,000 Units by mouth daily.     cyanocobalamin 1000 MCG/ML injection  Commonly known as:  (VITAMIN B-12)  Inject 1,000 mcg into the muscle every 3 (three) months.     cyclobenzaprine 10 MG tablet  Commonly known as:  FLEXERIL  Take 10 mg by mouth 3 (three) times daily as needed for muscle spasms.     desloratadine 5 MG tablet  Commonly known as:  CLARINEX  Take 1 tablet (5 mg total) by mouth daily.     diazepam 10 MG tablet  Commonly known as:  VALIUM  Take 2 tablets (20 mg total) by mouth 3 (three) times daily.     diclofenac sodium 1 % Gel  Commonly known as:  VOLTAREN  Apply 2 g topically 4 (four) times daily.     EPIPEN 2-PAK 0.3 mg/0.3 mL Soaj injection  Generic drug:  EPINEPHrine  USE AS DIRECTED FOR  SEVERE  ALLERGIC  REACTIONS     Fluticasone-Salmeterol 250-50 MCG/DOSE  Aepb  Commonly known as:  ADVAIR  Inhale 1 puff into the lungs 2 (two) times daily.     gabapentin 300 MG capsule  Commonly known as:  NEURONTIN  Take 300 mg by mouth 3 (three) times daily.     HYDROcodone-acetaminophen 7.5-325 MG per tablet  Commonly known as:  NORCO  Take 1 tablet by mouth every 6 (six) hours as needed for moderate pain or severe pain.     levocetirizine 5 MG tablet  Commonly known as:  XYZAL  Take 1 tablet (5 mg total) by mouth every evening.     lidocaine 5 %  Commonly known as:  LIDODERM  Place 1 patch onto the skin daily. Remove & Discard patch within 12 hours or as directed by MD     loratadine 10 MG tablet  Commonly known as:  CLARITIN  Take 10 mg by mouth 2 (two) times daily.     meloxicam 7.5 MG tablet  Commonly known as:  MOBIC  Take 15 mg by mouth daily.     montelukast 10 MG tablet  Commonly known as:  SINGULAIR  Take  by mouth.     omeprazole 40 MG capsule  Commonly known as:  PRILOSEC  Take 40 mg by mouth daily.     traMADol 50 MG tablet  Commonly known as:  ULTRAM  Take 50 mg by mouth every 6 (six) hours as needed.

## 2015-03-03 NOTE — Telephone Encounter (Signed)
Pepcid is an H2 antihistamine, and has effectiveness against hives when added to H1 antihistamines like clarinex or  Xyzal. It could replace omeprazole.  If adding Pepcid doesn't bring her current hives under better control, let us know.

## 2015-03-03 NOTE — Telephone Encounter (Signed)
Attempted to call pt on cell phone #, mailbox is full. Called home #, lmtcb x1

## 2015-03-03 NOTE — Telephone Encounter (Signed)
Pt is aware of CY's response. Nothing further was needed. 

## 2015-03-04 ENCOUNTER — Telehealth: Payer: Self-pay | Admitting: Internal Medicine

## 2015-03-04 NOTE — Telephone Encounter (Signed)
We do not have samples at this time. Pt is aware. Nothing further was needed. 

## 2015-03-10 ENCOUNTER — Other Ambulatory Visit (HOSPITAL_BASED_OUTPATIENT_CLINIC_OR_DEPARTMENT_OTHER): Payer: Medicare Other | Admitting: Lab

## 2015-03-10 ENCOUNTER — Encounter: Payer: Self-pay | Admitting: Family

## 2015-03-10 ENCOUNTER — Ambulatory Visit (HOSPITAL_BASED_OUTPATIENT_CLINIC_OR_DEPARTMENT_OTHER): Payer: Medicare Other

## 2015-03-10 ENCOUNTER — Ambulatory Visit (HOSPITAL_BASED_OUTPATIENT_CLINIC_OR_DEPARTMENT_OTHER): Payer: Medicare Other | Admitting: Family

## 2015-03-10 VITALS — BP 116/74 | HR 90 | Temp 97.8°F | Resp 14 | Ht 63.0 in | Wt 157.0 lb

## 2015-03-10 DIAGNOSIS — D510 Vitamin B12 deficiency anemia due to intrinsic factor deficiency: Secondary | ICD-10-CM

## 2015-03-10 DIAGNOSIS — D500 Iron deficiency anemia secondary to blood loss (chronic): Secondary | ICD-10-CM

## 2015-03-10 DIAGNOSIS — Z79899 Other long term (current) drug therapy: Secondary | ICD-10-CM | POA: Diagnosis not present

## 2015-03-10 DIAGNOSIS — G8929 Other chronic pain: Secondary | ICD-10-CM

## 2015-03-10 DIAGNOSIS — G2582 Stiff-man syndrome: Secondary | ICD-10-CM | POA: Diagnosis not present

## 2015-03-10 DIAGNOSIS — M545 Low back pain: Secondary | ICD-10-CM | POA: Diagnosis not present

## 2015-03-10 DIAGNOSIS — D509 Iron deficiency anemia, unspecified: Secondary | ICD-10-CM

## 2015-03-10 LAB — CMP (CANCER CENTER ONLY)
ALT(SGPT): 14 U/L (ref 10–47)
AST: 20 U/L (ref 11–38)
Albumin: 3.7 g/dL (ref 3.3–5.5)
Alkaline Phosphatase: 94 U/L — ABNORMAL HIGH (ref 26–84)
BILIRUBIN TOTAL: 0.4 mg/dL (ref 0.20–1.60)
BUN, Bld: 14 mg/dL (ref 7–22)
CO2: 28 meq/L (ref 18–33)
Calcium: 9.1 mg/dL (ref 8.0–10.3)
Chloride: 106 mEq/L (ref 98–108)
Creat: 0.9 mg/dl (ref 0.6–1.2)
GLUCOSE: 110 mg/dL (ref 73–118)
Potassium: 3.8 mEq/L (ref 3.3–4.7)
SODIUM: 146 meq/L — AB (ref 128–145)
TOTAL PROTEIN: 7.8 g/dL (ref 6.4–8.1)

## 2015-03-10 LAB — IRON AND TIBC CHCC
%SAT: 34 % (ref 21–57)
IRON: 85 ug/dL (ref 41–142)
TIBC: 254 ug/dL (ref 236–444)
UIBC: 169 ug/dL (ref 120–384)

## 2015-03-10 LAB — CBC WITH DIFFERENTIAL (CANCER CENTER ONLY)
BASO#: 0 10*3/uL (ref 0.0–0.2)
BASO%: 0 % (ref 0.0–2.0)
EOS%: 0 % (ref 0.0–7.0)
Eosinophils Absolute: 0 10*3/uL (ref 0.0–0.5)
HCT: 35.7 % (ref 34.8–46.6)
HEMOGLOBIN: 11.6 g/dL (ref 11.6–15.9)
LYMPH#: 2 10*3/uL (ref 0.9–3.3)
LYMPH%: 38.3 % (ref 14.0–48.0)
MCH: 28.4 pg (ref 26.0–34.0)
MCHC: 32.5 g/dL (ref 32.0–36.0)
MCV: 87 fL (ref 81–101)
MONO#: 0.3 10*3/uL (ref 0.1–0.9)
MONO%: 6.1 % (ref 0.0–13.0)
NEUT#: 2.9 10*3/uL (ref 1.5–6.5)
NEUT%: 55.6 % (ref 39.6–80.0)
Platelets: 323 10*3/uL (ref 145–400)
RBC: 4.09 10*6/uL (ref 3.70–5.32)
RDW: 12.9 % (ref 11.1–15.7)
WBC: 5.3 10*3/uL (ref 3.9–10.0)

## 2015-03-10 LAB — FERRITIN CHCC: Ferritin: 312 ng/ml — ABNORMAL HIGH (ref 9–269)

## 2015-03-10 LAB — VITAMIN B12: Vitamin B-12: 439 pg/mL (ref 211–911)

## 2015-03-10 MED ORDER — CYANOCOBALAMIN 1000 MCG/ML IJ SOLN
INTRAMUSCULAR | Status: AC
  Filled 2015-03-10: qty 1

## 2015-03-10 MED ORDER — CYANOCOBALAMIN 1000 MCG/ML IJ SOLN
1000.0000 ug | Freq: Once | INTRAMUSCULAR | Status: AC
  Administered 2015-03-10: 1000 ug via INTRAMUSCULAR

## 2015-03-10 NOTE — Progress Notes (Signed)
Hematology and Oncology Follow Up Visit  Sherri [name] [id number] [birth year] 49 y.o. 03/10/2015   Principle Diagnosis:  1. Pernicious anemia -- anti-intrinsic factor antibodies.  2. Intermittent iron-deficiency anemia.  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  4. 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  Current Therapy:   1. Vitamin B12, 1 mg IM q.3 months.  2. IV iron as indicated.    Interim History: Sherri [name] is here for a follow-up today. She is feeling better. She recently went to see her PCP and had some changes made to her medications and this has really helped her.  She is still under quite a bit of stress at home. She takes valium TID.  Her appetite is good and she is staying hydrated.  She denies fever, chills, n/v, cough, rash, headache, dizziness, SOB, chest pain, palpitations, abdominal pain, constipation, diarrhea, blood in urine or stool.  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 She has chronic back pain and is on flexeril.  She has and no swelling, tenderness or numbness in her extremities. She does have some tingling in her fingertips that comes and goes. She states that this is not a new issue.  In December, her B12 was 446, ferritin was 375 and iron saturation was 29%.   Medications:    Medication List       This list is accurate as of: 03/10/15 11:09 AM.  Always use your most recent med list.               albuterol 108 (90 BASE) MCG/ACT inhaler  Commonly known as:  PROVENTIL HFA;VENTOLIN HFA  Inhale 1 puff into the lungs every 6 (six) hours as needed. PRO AIR     baclofen 20 MG tablet  Commonly known as:  LIORESAL  20 mg 3 (three) times daily.     cholecalciferol 1000 UNITS tablet  Commonly known as:  VITAMIN D  Take 1,000 Units by mouth daily.     cyanocobalamin 1000 MCG/ML injection  Commonly known as:  (VITAMIN B-12)  Inject 1,000 mcg into the muscle every 3 (three) months.     cyclobenzaprine 10 MG  tablet  Commonly known as:  FLEXERIL  Take 10 mg by mouth 3 (three) times daily as needed for muscle spasms.     desloratadine 5 MG tablet  Commonly known as:  CLARINEX  Take 1 tablet (5 mg total) by mouth daily.     diazepam 10 MG tablet  Commonly known as:  VALIUM  Take 2 tablets (20 mg total) by mouth 3 (three) times daily.     diclofenac sodium 1 % Gel  Commonly known as:  VOLTAREN  Apply 2 g topically 4 (four) times daily.     diphenhydrAMINE 25 MG tablet  Commonly known as:  BENADRYL  Take 25 mg by mouth at bedtime as needed.     EPIPEN 2-PAK 0.3 mg/0.3 mL Soaj injection  Generic drug:  EPINEPHrine  USE AS DIRECTED FOR  SEVERE  ALLERGIC  REACTIONS     famotidine 20 MG tablet  Commonly known as:  PEPCID  Take 20 mg by mouth daily.     Fluticasone-Salmeterol 250-50 MCG/DOSE Aepb  Commonly known as:  ADVAIR  Inhale 1 puff into the lungs 2 (two) times daily.     gabapentin 300 MG capsule  Commonly known as:  NEURONTIN  Take 300 mg by mouth 3 (three) times daily.     HYDROcodone-acetaminophen 7.5-325 MG per tablet  Commonly known as:  NORCO  Take 1 tablet by mouth every 6 (six) hours as needed for moderate pain or severe pain.     levocetirizine 5 MG tablet  Commonly known as:  XYZAL  Take 1 tablet (5 mg total) by mouth every evening.     lidocaine 5 %  Commonly known as:  LIDODERM  Place 1 patch onto the skin daily. Remove & Discard patch within 12 hours or as directed by MD     loratadine 10 MG tablet  Commonly known as:  CLARITIN  Take 10 mg by mouth 2 (two) times daily.     meloxicam 7.5 MG tablet  Commonly known as:  MOBIC  Take 15 mg by mouth daily.     montelukast 10 MG tablet  Commonly known as:  SINGULAIR  Take by mouth.     omeprazole 40 MG capsule  Commonly known as:  PRILOSEC  Take 40 mg by mouth daily.     traMADol 50 MG tablet  Commonly known as:  ULTRAM  Take 50 mg by mouth every 6 (six) hours as needed.        Allergies:    Allergies  Allergen Reactions  . Ibuprofen Other (See Comments)    Does not take due to hx of renal insufficiency  . Amoxicillin Diarrhea    Past Medical History, Surgical history, Social history, and Family History were reviewed and updated.  Review of Systems: All other 10 point review of systems is negative.   Physical Exam:  height is 5\' 3"  (1.6 m) and weight is 157 lb (71.215 kg). Her oral temperature is 97.8 F (36.6 C). Her blood pressure is 116/74 and her pulse is 90. Her respiration is 14.   Wt Readings from Last 3 Encounters:  03/10/15 157 lb (71.215 kg)  12/30/14 158 lb 9.6 oz (71.94 kg)  12/18/14 159 lb 9.8 oz (72.4 kg)    Ocular: Sclerae unicteric, pupils equal, round and reactive to light Ear-nose-throat: Oropharynx clear, dentition fair Lymphatic: No cervical or supraclavicular adenopathy Lungs no rales or rhonchi, good excursion bilaterally Heart regular rate and rhythm, no murmur appreciated Abd soft, nontender, positive bowel sounds MSK no focal spinal tenderness, no joint edema Neuro: non-focal, well-oriented, appropriate affect Breasts: Deferred  Lab Results  Component Value Date   WBC 5.3 03/10/2015   HGB 11.6 03/10/2015   HCT 35.7 03/10/2015   MCV 87 03/10/2015   PLT 323 03/10/2015   Lab Results  Component Value Date   FERRITIN 375* 12/09/2014   IRON 76 12/09/2014   TIBC 259 12/09/2014   UIBC 182 12/09/2014   IRONPCTSAT 29 12/09/2014   Lab Results  Component Value Date   RETICCTPCT 1.9 06/02/2014   RBC 4.09 03/10/2015   RETICCTABS 79.0 06/02/2014   Lab Results  Component Value Date   KPAFRELGTCHN 1.06 08/02/2011   LAMBDASER 1.49 08/02/2011   KAPLAMBRATIO 0.71 08/02/2011   Lab Results  Component Value Date   IGGSERUM 1180 08/02/2011   IGA 206 08/02/2011   IGMSERUM 198 08/02/2011   Lab Results  Component Value Date   TOTALPROTELP 6.6 08/02/2011   ALBUMINELP 57.1 08/02/2011   A1GS 4.1 08/02/2011   A2GS 9.6 08/02/2011   BETS  6.1 08/02/2011   BETA2SER 5.9 08/02/2011   GAMS 17.2 08/02/2011   MSPIKE NOT DET 08/02/2011   SPEI * 08/02/2011     Chemistry      Component Value Date/Time   NA 146* 03/10/2015 1021   NA  142 12/18/2014 0500   K 3.8 03/10/2015 1021   K 3.7 12/18/2014 0500   CL 106 03/10/2015 1021   CL 110 12/18/2014 0500   CO2 28 03/10/2015 1021   CO2 26 12/18/2014 0500   BUN 14 03/10/2015 1021   BUN 7 12/18/2014 0500   CREATININE 0.9 03/10/2015 1021   CREATININE 1.04 12/18/2014 0500      Component Value Date/Time   CALCIUM 9.1 03/10/2015 1021   CALCIUM 9.0 12/18/2014 0500   ALKPHOS 94* 03/10/2015 1021   ALKPHOS 81 12/18/2014 0500   AST 20 03/10/2015 1021   AST 22 12/18/2014 0500   ALT 14 03/10/2015 1021   ALT 19 12/18/2014 0500   BILITOT 0.40 03/10/2015 1021   BILITOT 0.3 12/18/2014 0500     Impression and Plan: Ms. Kiger is 49 year old African American female with pernicious anemia. She is doing well and is asymptomatic at this time.  Her Hgb today was 11.6 MCV 87. We will see what her iron studies show and get her back in later this week for [facility] if needed.  We will give her B12 today.  We will see her back in 3 months for labs and follow-up.  She is knows to call here with any questions or concerns and to go to the ED in the event of an emergency. We can certainly see her sooner if need be.   Verdie Mosher, NP 3/16/201611:09 AM

## 2015-03-10 NOTE — Patient Instructions (Signed)
Hydroxocobalamin injection What is this medicine? HYDROXOCOBALAMIN (hye drox oh koe BAL a min) is a form of vitamin B12. Vitamin B12 helps the growth of healthy blood cells, nerve cells, and proteins in the body. It also helps with the metabolism of fats and carbohydrates. This medicine is used to treat people with low levels of vitamin B12 or those who can not absorb vitamin B12. It also helps with the metabolism of fats and carbohydrates. It is also used as an antidote to treat cyanide poisoning. This medicine may be used for other purposes; ask your health care provider or pharmacist if you have questions. COMMON BRAND NAME(S): Cyanokit, Primabalt-RP What should I tell my health care provider before I take this medicine? They need to know if you have any of these conditions: -high blood pressure -iron-deficiency anemia -kidney disease -low levels of folic acid in the blood -megaloblastic anemia -an unusual or allergic reaction to hydroxocobalamin, cyanocobalamin, cobalt, other medicines, foods, dyes, or preservatives -pregnant or trying to get pregnant -breast-feeding How should I use this medicine? This medicine is for injection into a muscle or a vein. It is given by a health care professional in a clinic, doctor's office, or hospital. Talk to your pediatrician regarding the use of this medicine in children. Special care may be needed. Overdosage: If you think you have taken too much of this medicine contact a poison control center or emergency room at once. NOTE: This medicine is only for you. Do not share this medicine with others. What if I miss a dose? It is important not to miss your dose. Call your doctor or health care professional if you are unable to keep an appointment. What may interact with this medicine? -chemotherapy -chloramphenicol This list may not describe all possible interactions. Give your health care provider a list of all the medicines, herbs, non-prescription  drugs, or dietary supplements you use. Also tell them if you smoke, drink alcohol, or use illegal drugs. Some items may interact with your medicine. What should I watch for while using this medicine? Visit your doctor or health care professional regularly. You may need blood work done while you are taking this medicine. You may need to follow a special diet. Talk to your doctor. Limit your alcohol intake and avoid smoking to get the best benefit. What side effects may I notice from receiving this medicine? Side effects that you should report to your doctor or health care professional as soon as possible: -allergic reactions like skin rash, itching or hives, swelling of the face, lips, or tongue -breathing problems -chest tightness, pain -dizziness -shaking or shivering Side effects that usually do not require medical attention (report to your doctor or health care professional if they continue or are bothersome): -diarrhea -headache -nausea, vomiting -pain at site where injected -red colored urine -red tint to skin This list may not describe all possible side effects. Call your doctor for medical advice about side effects. You may report side effects to FDA at 1-800-FDA-1088. Where should I keep my medicine? This drug is given in a hospital or clinic and will not be stored at home. NOTE: This sheet is a summary. It may not cover all possible information. If you have questions about this medicine, talk to your doctor, pharmacist, or health care provider.  2015, Elsevier/Gold Standard. (2008-04-27 14:49:26)

## 2015-03-15 ENCOUNTER — Telehealth: Payer: Self-pay | Admitting: Hematology & Oncology

## 2015-03-15 NOTE — Telephone Encounter (Signed)
Faxed medical records today to:   [facility] [place] P: [phone] F: [phone]  Case: [id number]     COPY SCANNED

## 2015-03-16 DIAGNOSIS — F411 Generalized anxiety disorder: Secondary | ICD-10-CM | POA: Diagnosis not present

## 2015-03-16 DIAGNOSIS — F410 Panic disorder [episodic paroxysmal anxiety] without agoraphobia: Secondary | ICD-10-CM | POA: Diagnosis not present

## 2015-04-26 ENCOUNTER — Ambulatory Visit: Payer: Federal, State, Local not specified - PPO | Admitting: Adult Health

## 2015-04-28 DIAGNOSIS — F411 Generalized anxiety disorder: Secondary | ICD-10-CM | POA: Diagnosis not present

## 2015-04-28 DIAGNOSIS — F410 Panic disorder [episodic paroxysmal anxiety] without agoraphobia: Secondary | ICD-10-CM | POA: Diagnosis not present

## 2015-05-11 DIAGNOSIS — Z0289 Encounter for other administrative examinations: Secondary | ICD-10-CM

## 2015-05-28 DIAGNOSIS — Z800 Family history of malignant neoplasm of digestive organs: Secondary | ICD-10-CM | POA: Diagnosis not present

## 2015-05-28 DIAGNOSIS — K5900 Constipation, unspecified: Secondary | ICD-10-CM | POA: Diagnosis not present

## 2015-05-28 DIAGNOSIS — Z1211 Encounter for screening for malignant neoplasm of colon: Secondary | ICD-10-CM | POA: Diagnosis not present

## 2015-05-28 DIAGNOSIS — K625 Hemorrhage of anus and rectum: Secondary | ICD-10-CM | POA: Diagnosis not present

## 2015-05-28 DIAGNOSIS — K600 Acute anal fissure: Secondary | ICD-10-CM | POA: Diagnosis not present

## 2015-06-09 ENCOUNTER — Ambulatory Visit (HOSPITAL_BASED_OUTPATIENT_CLINIC_OR_DEPARTMENT_OTHER): Payer: Medicare Other | Admitting: Family

## 2015-06-09 ENCOUNTER — Other Ambulatory Visit (HOSPITAL_BASED_OUTPATIENT_CLINIC_OR_DEPARTMENT_OTHER): Payer: Medicare Other

## 2015-06-09 ENCOUNTER — Ambulatory Visit (HOSPITAL_BASED_OUTPATIENT_CLINIC_OR_DEPARTMENT_OTHER): Payer: Medicare Other

## 2015-06-09 ENCOUNTER — Encounter: Payer: Self-pay | Admitting: Family

## 2015-06-09 VITALS — BP 109/70 | HR 93 | Temp 98.4°F

## 2015-06-09 DIAGNOSIS — D509 Iron deficiency anemia, unspecified: Secondary | ICD-10-CM

## 2015-06-09 DIAGNOSIS — D510 Vitamin B12 deficiency anemia due to intrinsic factor deficiency: Secondary | ICD-10-CM | POA: Diagnosis not present

## 2015-06-09 DIAGNOSIS — Z86718 Personal history of other venous thrombosis and embolism: Secondary | ICD-10-CM

## 2015-06-09 LAB — CBC WITH DIFFERENTIAL (CANCER CENTER ONLY)
BASO#: 0 10*3/uL (ref 0.0–0.2)
BASO%: 0 % (ref 0.0–2.0)
EOS ABS: 0 10*3/uL (ref 0.0–0.5)
EOS%: 0 % (ref 0.0–7.0)
HCT: 33.7 % — ABNORMAL LOW (ref 34.8–46.6)
HGB: 11 g/dL — ABNORMAL LOW (ref 11.6–15.9)
LYMPH#: 1.6 10*3/uL (ref 0.9–3.3)
LYMPH%: 32.2 % (ref 14.0–48.0)
MCH: 28.7 pg (ref 26.0–34.0)
MCHC: 32.6 g/dL (ref 32.0–36.0)
MCV: 88 fL (ref 81–101)
MONO#: 0.3 10*3/uL (ref 0.1–0.9)
MONO%: 5.5 % (ref 0.0–13.0)
NEUT%: 62.3 % (ref 39.6–80.0)
NEUTROS ABS: 3.2 10*3/uL (ref 1.5–6.5)
PLATELETS: 286 10*3/uL (ref 145–400)
RBC: 3.83 10*6/uL (ref 3.70–5.32)
RDW: 13 % (ref 11.1–15.7)
WBC: 5.1 10*3/uL (ref 3.9–10.0)

## 2015-06-09 LAB — COMPREHENSIVE METABOLIC PANEL
ALT: 9 U/L (ref 0–35)
AST: 14 U/L (ref 0–37)
Albumin: 4 g/dL (ref 3.5–5.2)
Alkaline Phosphatase: 91 U/L (ref 39–117)
BILIRUBIN TOTAL: 0.3 mg/dL (ref 0.2–1.2)
BUN: 11 mg/dL (ref 6–23)
CO2: 25 mEq/L (ref 19–32)
CREATININE: 0.96 mg/dL (ref 0.50–1.10)
Calcium: 9.8 mg/dL (ref 8.4–10.5)
Chloride: 107 mEq/L (ref 96–112)
Glucose, Bld: 87 mg/dL (ref 70–99)
Potassium: 4.3 mEq/L (ref 3.5–5.3)
Sodium: 145 mEq/L (ref 135–145)
Total Protein: 7.2 g/dL (ref 6.0–8.3)

## 2015-06-09 LAB — IRON AND TIBC CHCC
%SAT: 44 % (ref 21–57)
IRON: 102 ug/dL (ref 41–142)
TIBC: 231 ug/dL — ABNORMAL LOW (ref 236–444)
UIBC: 130 ug/dL (ref 120–384)

## 2015-06-09 LAB — VITAMIN B12: VITAMIN B 12: 446 pg/mL (ref 211–911)

## 2015-06-09 LAB — FERRITIN CHCC: Ferritin: 360 ng/ml — ABNORMAL HIGH (ref 9–269)

## 2015-06-09 MED ORDER — CYANOCOBALAMIN 1000 MCG/ML IJ SOLN
INTRAMUSCULAR | Status: AC
  Filled 2015-06-09: qty 1

## 2015-06-09 MED ORDER — CYANOCOBALAMIN 1000 MCG/ML IJ SOLN
1000.0000 ug | Freq: Once | INTRAMUSCULAR | Status: AC
Start: 2015-06-09 — End: 2015-06-09
  Administered 2015-06-09: 1000 ug via INTRAMUSCULAR

## 2015-06-09 MED ORDER — CYANOCOBALAMIN 1000 MCG/ML IJ SOLN: 1000.0000 ug | Freq: Once | INTRAMUSCULAR | Status: DC

## 2015-06-09 NOTE — Progress Notes (Signed)
Hematology and Oncology Follow Up Visit  Daquana Paddock [id number] 01-16-1966 49 y.o. 06/09/2015   Principle Diagnosis:  1. Pernicious anemia - anti-intrinsic factor antibodies.  2. Intermittent iron-deficiency anemia.  4. 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   Current Therapy:   1. Vitamin B12, 1 mg IM q.4 months.  2. IV iron as indicated.    Interim History: Ms. Wigger is here for a follow-up today. She is stilling having pain in her back. She has had a change in her medications and is experiencing fatigue as a side effect.  She has had no problem with infections.  No fever, chills, n/v, cough, rash, headache, dizziness, SOB, chest pain, palpitations, abdominal pain, change in bowel or bladder habits, blood in urine or stool.  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 She has the chronic back issues. She is using a cane to help her ambulate and also wearing a back brace for support.  She has and no swelling, tenderness or numbness in her extremities. She still has intermittent tingling in her fingertips.  She has a good appetite and is staying hydrated. No significant weight loss or gain.  In March, her B12 was 439, ferritin was 312 and iron saturation was 34%.   Medications:    Medication List       This list is accurate as of: 06/09/15 10:55 AM.  Always use your most recent med list.               albuterol 108 (90 BASE) MCG/ACT inhaler  Commonly known as:  PROVENTIL HFA;VENTOLIN HFA  Inhale 1 puff into the lungs every 6 (six) hours as needed. PRO AIR     baclofen 20 MG tablet  Commonly known as:  LIORESAL  20 mg 3 (three) times daily.     cholecalciferol 1000 UNITS tablet  Commonly known as:  VITAMIN D  Take 1,000 Units by mouth daily.     cyanocobalamin 1000 MCG/ML injection  Commonly known as:  (VITAMIN B-12)  Inject 1,000 mcg into the muscle every 3 (three) months.     cyclobenzaprine 10 MG  tablet  Commonly known as:  FLEXERIL  Take 10 mg by mouth 3 (three) times daily as needed for muscle spasms.     desloratadine 5 MG tablet  Commonly known as:  CLARINEX  Take 1 tablet (5 mg total) by mouth daily.     diazepam 10 MG tablet  Commonly known as:  VALIUM  Take 2 tablets (20 mg total) by mouth 3 (three) times daily.     diclofenac sodium 1 % Gel  Commonly known as:  VOLTAREN  Apply 2 g topically 4 (four) times daily.     diphenhydrAMINE 25 MG tablet  Commonly known as:  BENADRYL  Take 25 mg by mouth at bedtime as needed.     docusate sodium 100 MG capsule  Commonly known as:  COLACE  Take 100 mg by mouth 2 (two) times daily.     EPIPEN 2-PAK 0.3 mg/0.3 mL Soaj injection  Generic drug:  EPINEPHrine  USE AS DIRECTED FOR  SEVERE  ALLERGIC  REACTIONS     famotidine 20 MG tablet  Commonly known as:  PEPCID  Take 20 mg by mouth daily.     Fluticasone-Salmeterol 250-50 MCG/DOSE Aepb  Commonly known as:  ADVAIR  Inhale 1 puff into the lungs 2 (two) times daily.     gabapentin 300 MG capsule  Commonly known as:  NEURONTIN  Take 300 mg by mouth 3 (three) times daily.     HYDROcodone-acetaminophen 7.5-325 MG per tablet  Commonly known as:  NORCO  Take 1 tablet by mouth every 6 (six) hours as needed for moderate pain or severe pain.     levocetirizine 5 MG tablet  Commonly known as:  XYZAL  Take 1 tablet (5 mg total) by mouth every evening.     lidocaine 5 %  Commonly known as:  LIDODERM  Place 1 patch onto the skin daily. Remove & Discard patch within 12 hours or as directed by MD     loratadine 10 MG tablet  Commonly known as:  CLARITIN  Take 10 mg by mouth 2 (two) times daily.     meloxicam 7.5 MG tablet  Commonly known as:  MOBIC  Take 15 mg by mouth daily.     montelukast 10 MG tablet  Commonly known as:  SINGULAIR  Take by mouth.     omeprazole 40 MG capsule  Commonly known as:  PRILOSEC  Take 40 mg by mouth daily.     traMADol 50 MG tablet    Commonly known as:  ULTRAM  Take 50 mg by mouth every 6 (six) hours as needed.        Allergies:  Allergies  Allergen Reactions  . Ibuprofen Other (See Comments)    Does not take due to hx of renal insufficiency  . Amoxicillin Diarrhea    Past Medical History, Surgical history, Social history, and Family History were reviewed and updated.  Review of Systems: All other 10 point review of systems is negative.   Physical Exam:  oral temperature is 98.4 F (36.9 C). Her blood pressure is 109/70 and her pulse is 93.   Wt Readings from Last 3 Encounters:  03/10/15 157 lb (71.215 kg)  12/30/14 158 lb 9.6 oz (71.94 kg)  12/18/14 159 lb 9.8 oz (72.4 kg)    Ocular: Sclerae unicteric, pupils equal, round and reactive to light Ear-nose-throat: Oropharynx clear, dentition fair Lymphatic: No cervical or supraclavicular adenopathy Lungs no rales or rhonchi, good excursion bilaterally Heart regular rate and rhythm, no murmur appreciated Abd soft, nontender, positive bowel sounds MSK no focal spinal tenderness, no joint edema Neuro: non-focal, well-oriented, appropriate affect Breasts: Deferred  Lab Results  Component Value Date   WBC 5.1 06/09/2015   HGB 11.0* 06/09/2015   HCT 33.7* 06/09/2015   MCV 88 06/09/2015   PLT 286 06/09/2015   Lab Results  Component Value Date   FERRITIN 312* 03/10/2015   IRON 85 03/10/2015   TIBC 254 03/10/2015   UIBC 169 03/10/2015   IRONPCTSAT 34 03/10/2015   Lab Results  Component Value Date   RETICCTPCT 1.9 06/02/2014   RBC 3.83 06/09/2015   RETICCTABS 79.0 06/02/2014   Lab Results  Component Value Date   KPAFRELGTCHN 1.06 08/02/2011   LAMBDASER 1.49 08/02/2011   KAPLAMBRATIO 0.71 08/02/2011   Lab Results  Component Value Date   IGGSERUM 1180 08/02/2011   IGA 206 08/02/2011   IGMSERUM 198 08/02/2011   Lab Results  Component Value Date   TOTALPROTELP 6.6 08/02/2011   ALBUMINELP 57.1 08/02/2011   A1GS 4.1 08/02/2011   A2GS  9.6 08/02/2011   BETS 6.1 08/02/2011   BETA2SER 5.9 08/02/2011   GAMS 17.2 08/02/2011   MSPIKE NOT DET 08/02/2011   SPEI * 08/02/2011     Chemistry      Component Value Date/Time  NA 146* 03/10/2015 1021   NA 142 12/18/2014 0500   K 3.8 03/10/2015 1021   K 3.7 12/18/2014 0500   CL 106 03/10/2015 1021   CL 110 12/18/2014 0500   CO2 28 03/10/2015 1021   CO2 26 12/18/2014 0500   BUN 14 03/10/2015 1021   BUN 7 12/18/2014 0500   CREATININE 0.9 03/10/2015 1021   CREATININE 1.04 12/18/2014 0500      Component Value Date/Time   CALCIUM 9.1 03/10/2015 1021   CALCIUM 9.0 12/18/2014 0500   ALKPHOS 94* 03/10/2015 1021   ALKPHOS 81 12/18/2014 0500   AST 20 03/10/2015 1021   AST 22 12/18/2014 0500   ALT 14 03/10/2015 1021   ALT 19 12/18/2014 0500   BILITOT 0.40 03/10/2015 1021   BILITOT 0.3 12/18/2014 0500     Impression and Plan: Ms. Orefice is 49 year old African American female with pernicious anemia and intermittent iron deficiency anemia. She is doing well and is asymptomatic at this time. Unfortunately, her biggest problems seem to stem from her stiff man syndrome and chronic back pain.  Her Hgb today was 11.0 MCV 88. We will see what her iron studies show and get her back in later this week for [facility] if needed.  We will go ahead and give her a B12 injection and then start spacing them out to every 4 months with her follow-up appointment.   She is knows to call here with any questions or concerns. We can certainly see her sooner if need be.   Verdie Mosher, NP 6/15/201610:55 AM

## 2015-06-09 NOTE — Patient Instructions (Signed)

## 2015-06-16 DIAGNOSIS — M62838 Other muscle spasm: Secondary | ICD-10-CM | POA: Diagnosis not present

## 2015-06-16 DIAGNOSIS — Z1239 Encounter for other screening for malignant neoplasm of breast: Secondary | ICD-10-CM | POA: Diagnosis not present

## 2015-06-16 DIAGNOSIS — R7309 Other abnormal glucose: Secondary | ICD-10-CM | POA: Diagnosis not present

## 2015-06-16 DIAGNOSIS — M545 Low back pain: Secondary | ICD-10-CM | POA: Diagnosis not present

## 2015-06-16 DIAGNOSIS — Z0000 Encounter for general adult medical examination without abnormal findings: Secondary | ICD-10-CM | POA: Diagnosis not present

## 2015-06-16 DIAGNOSIS — Z79899 Other long term (current) drug therapy: Secondary | ICD-10-CM | POA: Diagnosis not present

## 2015-06-16 DIAGNOSIS — N182 Chronic kidney disease, stage 2 (mild): Secondary | ICD-10-CM | POA: Diagnosis not present

## 2015-06-16 DIAGNOSIS — R05 Cough: Secondary | ICD-10-CM | POA: Diagnosis not present

## 2015-06-16 DIAGNOSIS — F419 Anxiety disorder, unspecified: Secondary | ICD-10-CM | POA: Diagnosis not present

## 2015-06-16 DIAGNOSIS — I129 Hypertensive chronic kidney disease with stage 1 through stage 4 chronic kidney disease, or unspecified chronic kidney disease: Secondary | ICD-10-CM | POA: Diagnosis not present

## 2015-06-16 DIAGNOSIS — G2582 Stiff-man syndrome: Secondary | ICD-10-CM | POA: Diagnosis not present

## 2015-06-16 DIAGNOSIS — Z5181 Encounter for therapeutic drug level monitoring: Secondary | ICD-10-CM | POA: Diagnosis not present

## 2015-06-16 DIAGNOSIS — E559 Vitamin D deficiency, unspecified: Secondary | ICD-10-CM | POA: Diagnosis not present

## 2015-06-17 ENCOUNTER — Telehealth: Payer: Self-pay | Admitting: Neurology

## 2015-06-17 NOTE — Telephone Encounter (Signed)
I called the patient. She stated that she was seeing Dr. Greig Castilla for pain management. She would prefer to not see him and be able to get the 2 medications listed below from Dr. Anne Hahn. She states she takes these for back pain and was getting them from an orthopedic doctor, who has recently told her he cannot prescribe them any longer. I told her I would check with Dr. Anne Hahn and call her back.

## 2015-06-17 NOTE — Telephone Encounter (Signed)
I called the patient and left a voicemail.

## 2015-06-17 NOTE — Telephone Encounter (Signed)
Patient called requesting to get HYDROcodone-acetaminophen (NORCO) 7.5-325 MG per tablet , traMADol (ULTRAM) 50 MG tablet from Dr Anne Hahn as opposed to getting it from Dr Greig Castilla at [facility]. She is seeing him for the 1st time today. Patient has repeated herself numerous times. Please call and advise. Patient can be reached at [phone].

## 2015-06-17 NOTE — Telephone Encounter (Signed)
I called the patient. The patient is requesting that we write prescriptions for hydrocodone and Ultram. The patient has a documented urine drug screen that is positive for cocaine. We cannot prescribe opiate medications for her.

## 2015-06-17 NOTE — Telephone Encounter (Signed)
Patient is returning a call. °

## 2015-06-22 ENCOUNTER — Telehealth: Payer: Self-pay | Admitting: *Deleted

## 2015-06-22 NOTE — Telephone Encounter (Signed)
Spoke to the pt on the phone, she informed me that she would be out of town on the 7th and needed to reschedule her appt. I was able to get her an appt with Megan NP on 07/07/15 at 4 pm. She thanked me

## 2015-06-29 ENCOUNTER — Ambulatory Visit: Payer: Self-pay | Admitting: Neurology

## 2015-06-30 ENCOUNTER — Ambulatory Visit: Payer: Medicare Other | Admitting: Neurology

## 2015-07-01 ENCOUNTER — Telehealth: Payer: Self-pay | Admitting: Internal Medicine

## 2015-07-01 ENCOUNTER — Encounter: Payer: Self-pay | Admitting: Internal Medicine

## 2015-07-01 NOTE — Telephone Encounter (Signed)
lmtcb for pt.  

## 2015-07-01 NOTE — Telephone Encounter (Signed)
Spoke with pt. She is wanting a letter from Dr. Maple HudsonYoung stating her asthma is related to environmental records.  She was also requesting medical records from 2013. I advised pt she would need to contact medical records. She was then transferred to them.  Please advise regarding letter Dr. Maple HudsonYoung thanks

## 2015-07-01 NOTE — Telephone Encounter (Signed)
Letter done

## 2015-07-01 NOTE — Telephone Encounter (Signed)
Pt is calling back [phone]

## 2015-07-02 NOTE — Telephone Encounter (Signed)
Pt will pick up a letter [phone]

## 2015-07-02 NOTE — Telephone Encounter (Signed)
I have letter in Triage.   Called and left message on voicemail asking patient if she wanted letter mailed to her or if she wants to pick it up. Awaiting call back.

## 2015-07-02 NOTE — Telephone Encounter (Signed)
Letter left at front for patient to pick up. Nothing further needed.

## 2015-07-06 DIAGNOSIS — M545 Low back pain: Secondary | ICD-10-CM | POA: Diagnosis not present

## 2015-07-07 ENCOUNTER — Ambulatory Visit: Payer: Self-pay | Admitting: Adult Health

## 2015-07-07 ENCOUNTER — Telehealth: Payer: Self-pay

## 2015-07-07 NOTE — Telephone Encounter (Signed)
No showed apt. Today. 

## 2015-07-08 ENCOUNTER — Encounter: Payer: Self-pay | Admitting: Adult Health

## 2015-07-09 HISTORY — PX: OTHER SURGICAL HISTORY: SHX169

## 2015-07-12 ENCOUNTER — Encounter: Payer: Self-pay | Admitting: Adult Health

## 2015-07-12 ENCOUNTER — Ambulatory Visit (INDEPENDENT_AMBULATORY_CARE_PROVIDER_SITE_OTHER): Payer: Medicare Other | Admitting: Adult Health

## 2015-07-12 VITALS — BP 117/85 | HR 79 | Ht 63.0 in | Wt 160.5 lb

## 2015-07-12 DIAGNOSIS — G2582 Stiff-man syndrome: Secondary | ICD-10-CM | POA: Diagnosis not present

## 2015-07-12 NOTE — Patient Instructions (Signed)
Continue Diazepam and baclofen

## 2015-07-12 NOTE — Progress Notes (Signed)
I have read the note, and I agree with the clinical assessment and plan.  WILLIS,CHARLES KEITH   

## 2015-07-12 NOTE — Progress Notes (Signed)
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 The patient is currently on diazepam. Her dosage was recently increased in June to 20 mg 3 times a day. The patient states that this is been working well. She states that occasionally the right leg will stiffen up on her. She states that this tends to occur more often in the mornings. She is also on baclofen prescribed by Dr. Allyne GeeSanders 20 mg 3 times a day. The patient denies any falls. She uses a walker when ambulating. She is also been evaluated at a pain clinic for ongoing back pain. At that office she is prescribed hydrocodone and Ultram for her pain. Eyes any new medical issues. She returns today for an evaluation.  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 The patient has done well on the combination baclofen and diazepam. She unfortunately had a fall just prior to Christmas of 2015, and she struck her head associated with several seconds of amnesia. The patient had gait instability, but a CT scan of the head did not show any traumatic changes of the brain. Urine drug screen was positive for cocaine, the patient indicates that she does not abuse cocaine. The patient is feeling better now, her gait stability has improved, she does not have headaches or neck pain. She is at or near her usual baseline. Previously, the patient was somewhat confused following the head injury.   REVIEW OF SYSTEMS: Out of a complete 14 system review of symptoms, the patient complains only of the following symptoms, and all other reviewed systems are negative.  Activity change, appetite change, fatigue, blurred vision, wheezing, environmental  allergies, bruise easily, anemia, weakness  ALLERGIES: Allergies  Allergen Reactions  . Ibuprofen Other (See Comments)    Does not take due to hx of renal insufficiency  . Tylenol [Acetaminophen] Hives    Cannot take large quantities  . Amoxicillin Diarrhea    HOME MEDICATIONS: Outpatient Prescriptions Prior to Visit  Medication Sig Dispense Refill  . albuterol (PROVENTIL HFA;VENTOLIN HFA) 108 (90 BASE) MCG/ACT inhaler Inhale 1 puff into the lungs every 6 (six) hours as needed. PRO AIR    . baclofen (LIORESAL) 20 MG tablet 20 mg 3 (three) times daily.     . cholecalciferol (VITAMIN D) 1000 UNITS tablet Take 1,000 Units by mouth daily.    . cyclobenzaprine (FLEXERIL) 10 MG tablet Take 10 mg by mouth at bedtime.     [name]. desloratadine (CLARINEX) 5 MG tablet Take 1 tablet (5 mg total) by mouth daily. 30 tablet 0  . diazepam (VALIUM) 10 MG tablet Take 2 tablets (20 mg total) by mouth 3 (three) times daily. 540 tablet 1  . diclofenac sodium (VOLTAREN) 1 % GEL Apply 2 g topically 4 (four) times daily. 1 Tube 0  . diphenhydrAMINE (BENADRYL) 25 MG tablet Take 25 mg by mouth at bedtime as needed.    [name]. EPIPEN 2-PAK 0.3 MG/0.3ML SOAJ injection USE AS DIRECTED FOR  SEVERE  ALLERGIC  REACTIONS 1 Device 11  . famotidine (PEPCID) 20 MG tablet Take 20 mg by mouth daily.    . Fluticasone-Salmeterol (ADVAIR) 250-50 MCG/DOSE AEPB Inhale 1 puff into the lungs 2 (two) times daily.     .[name]  gabapentin (NEURONTIN) 300 MG capsule Take 300 mg by mouth 3 (three) times daily.     [name] HYDROcodone-acetaminophen (NORCO) 7.5-325 MG per tablet Take 1 tablet by mouth every 6 (six) hours as needed for moderate pain or severe pain. 15 tablet 0  . levocetirizine (XYZAL) 5 MG tablet Take 1 tablet (5 mg total) by mouth every evening. 30 tablet 0  . loratadine (CLARITIN) 10 MG tablet Take 10 mg by mouth 2 (two) times daily.    . montelukast (SINGULAIR) 10 MG tablet Take by mouth.     [name] omeprazole (PRILOSEC) 40 MG capsule Take 40 mg  by mouth daily.    . traMADol (ULTRAM) 50 MG tablet Take 50 mg by mouth every 6 (six) hours as needed.    . cyanocobalamin (,VITAMIN B-12,) 1000 MCG/ML injection Inject 1,000 mcg into the muscle every 3 (three) months.     . lidocaine (LIDODERM) 5 % Place 1 patch onto the skin daily. Remove & Discard patch within 12 hours or as directed by MD (Patient not taking: Reported on 07/12/2015) 30 patch 0  . meloxicam (MOBIC) 7.5 MG tablet Take 15 mg by mouth daily.    [name] docusate sodium (COLACE) 100 MG capsule Take 100 mg by mouth 2 (two) times daily.     No facility-administered medications prior to visit.    PAST MEDICAL HISTORY: Past Medical History  Diagnosis Date  . Pernicious anemia 10/30/2011  . Lumbar herniated disc   . Environmental allergies   . Angioedema   . DVT (deep venous thrombosis)   . Asthma   . Seasonal allergies   . Urticaria   . Chronic back pain   . Anxiety   . Depression   . GERD (gastroesophageal reflux disease)   . Arthritis   . Chronic kidney disease   . Peripheral vascular disease   . CAP (community acquired pneumonia) 08/29/2012  . Stiff person syndrome   . Hiatal hernia 03/21/2013  . Cocaine abuse   . Postconcussion syndrome 12/30/2014    PAST SURGICAL HISTORY: Past Surgical History  Procedure Laterality Date  . Inguinal hernia 1983  1983  . Colonoscopy  07/09/15    FAMILY HISTORY: Family History  Problem Relation Age of Onset  . Cancer Mother   . Other Mother   . COPD Father   . Asthma Brother   . Cancer Brother     colon  . Heart attack Brother   . Seizures Brother     SOCIAL HISTORY: History   Social History  . Marital Status: Divorced    Spouse Name: N/A  . Number of Children: 1  . Years of Education: college   Occupational History  . disabled    Social History Main Topics  . Smoking status: Never Smoker   . Smokeless tobacco: Never Used     Comment: Never Used Tobacco  . Alcohol Use: 0.0 oz/week    0 Standard drinks or  equivalent per week     Comment: Occasional  . Drug Use: No  . Sexual Activity: Not on file   Other Topics Concern  . Not on file   Social History Narrative   Patient is right and left handed.   Patient drinks some caffeine occasionally.         PHYSICAL EXAM  Filed Vitals:   07/12/15 1442  BP: 117/85  Pulse: 79  Height:  (1.6 m)  Weight: 160 lb 8 oz (72.802 kg)   Body mass index is  28.44 kg/(m^2).  Generalized: Well developed, in no acute distress   Neurological examination  Mentation: Alert oriented to time, place, history taking. Follows all commands speech and language fluent Cranial nerve II-XII: Pupils were equal round reactive to light. Extraocular movements were full, visual field were full on confrontational test. Facial sensation and strength were normal. Uvula tongue midline. Head turning and shoulder shrug  were normal and symmetric. Motor: The motor testing reveals 5 over 5 strength of all 4 extremities. Good symmetric motor tone is noted throughout.  Sensory: Sensory testing is intact to soft touch on all 4 extremities. No evidence of extinction is noted.  Coordination: Cerebellar testing reveals good finger-nose-finger and heel-to-shin bilaterally.  Gait and station: She uses a walker when ambulating. Gait is slow with stiffness noted in the lower extremities bilaterally only when ambulating. She is able to flex and extend the lower legs when sitting.  Reflexes: Deep tendon reflexes are symmetric and normal bilaterally.   DIAGNOSTIC DATA (LABS, IMAGING, TESTING) - I reviewed patient records, labs, notes, testing and imaging myself where available.  Lab Results  Component Value Date   WBC 5.1 06/09/2015   HGB 11.0* 06/09/2015   HCT 33.7* 06/09/2015   MCV 88 06/09/2015   PLT 286 06/09/2015      Component Value Date/Time   NA 145 06/09/2015 1022   NA 146* 03/10/2015 1021   K 4.3 06/09/2015 1022   K 3.8 03/10/2015 1021   CL 107 06/09/2015 1022    CL 106 03/10/2015 1021   CO2 25 06/09/2015 1022   CO2 28 03/10/2015 1021   GLUCOSE 87 06/09/2015 1022   GLUCOSE 110 03/10/2015 1021   BUN 11 06/09/2015 1022   BUN 14 03/10/2015 1021   CREATININE 0.96 06/09/2015 1022   CREATININE 0.9 03/10/2015 1021   CALCIUM 9.8 06/09/2015 1022   CALCIUM 9.1 03/10/2015 1021   PROT 7.2 06/09/2015 1022   PROT 7.8 03/10/2015 1021   ALBUMIN 4.0 06/09/2015 1022   AST 14 06/09/2015 1022   AST 20 03/10/2015 1021   ALT 9 06/09/2015 1022   ALT 14 03/10/2015 1021   ALKPHOS 91 06/09/2015 1022   ALKPHOS 94* 03/10/2015 1021   BILITOT 0.3 06/09/2015 1022   BILITOT 0.40 03/10/2015 1021   GFRNONAA 62* 12/18/2014 0500   GFRAA 71* 12/18/2014 0500     Lab Results  Component Value Date   VITAMINB12 446 06/09/2015       ASSESSMENT AND PLAN 49 y.o. year old female  has a past medical history of Pernicious anemia (10/30/2011); Lumbar herniated disc; Environmental allergies; Angioedema; DVT (deep venous thrombosis); Asthma; Seasonal allergies; Urticaria; Chronic back pain; Anxiety; Depression; GERD (gastroesophageal reflux disease); Arthritis; Chronic kidney disease; Peripheral vascular disease; CAP (community acquired pneumonia) (08/29/2012); Stiff person syndrome; Hiatal hernia (03/21/2013); Cocaine abuse; and Postconcussion syndrome (12/30/2014). here with:  1. Stiff person syndrome  The patient is doing well on diazepam 20 mg 3 times a day. She is also on baclofen 20 mg 3 times a day. She should continue to use a walker when ambulating to prevent falls. She will occasionally have increased stiffness in the right leg. She advised that if her symptoms worsen or she develops new symptoms she should let us know. She will follow-up in 6 months or sooner if needed.  Butch Penny, MSN, NP-C 07/12/2015, 3:10 PM [facility] [address], [address] [place], [place] [address] [phone]  Note: This document was prepared with digital dictation and  possible  smart Company secretary. Any transcriptional errors that result from this process are unintentional.

## 2015-07-20 DIAGNOSIS — F410 Panic disorder [episodic paroxysmal anxiety] without agoraphobia: Secondary | ICD-10-CM | POA: Diagnosis not present

## 2015-07-20 DIAGNOSIS — F411 Generalized anxiety disorder: Secondary | ICD-10-CM | POA: Diagnosis not present

## 2015-07-21 DIAGNOSIS — Z1211 Encounter for screening for malignant neoplasm of colon: Secondary | ICD-10-CM | POA: Diagnosis not present

## 2015-07-21 DIAGNOSIS — K625 Hemorrhage of anus and rectum: Secondary | ICD-10-CM | POA: Diagnosis not present

## 2015-07-21 DIAGNOSIS — Z800 Family history of malignant neoplasm of digestive organs: Secondary | ICD-10-CM | POA: Diagnosis not present

## 2015-08-16 ENCOUNTER — Other Ambulatory Visit: Payer: Self-pay | Admitting: Internal Medicine

## 2015-09-06 DIAGNOSIS — N182 Chronic kidney disease, stage 2 (mild): Secondary | ICD-10-CM | POA: Diagnosis not present

## 2015-09-06 DIAGNOSIS — G2582 Stiff-man syndrome: Secondary | ICD-10-CM | POA: Diagnosis not present

## 2015-09-06 DIAGNOSIS — Z1389 Encounter for screening for other disorder: Secondary | ICD-10-CM | POA: Diagnosis not present

## 2015-09-06 DIAGNOSIS — R296 Repeated falls: Secondary | ICD-10-CM | POA: Diagnosis not present

## 2015-09-06 DIAGNOSIS — I129 Hypertensive chronic kidney disease with stage 1 through stage 4 chronic kidney disease, or unspecified chronic kidney disease: Secondary | ICD-10-CM | POA: Diagnosis not present

## 2015-09-18 DIAGNOSIS — N182 Chronic kidney disease, stage 2 (mild): Secondary | ICD-10-CM | POA: Diagnosis not present

## 2015-09-18 DIAGNOSIS — I129 Hypertensive chronic kidney disease with stage 1 through stage 4 chronic kidney disease, or unspecified chronic kidney disease: Secondary | ICD-10-CM | POA: Diagnosis not present

## 2015-09-18 DIAGNOSIS — Z9181 History of falling: Secondary | ICD-10-CM | POA: Diagnosis not present

## 2015-09-18 DIAGNOSIS — F419 Anxiety disorder, unspecified: Secondary | ICD-10-CM | POA: Diagnosis not present

## 2015-09-18 DIAGNOSIS — R296 Repeated falls: Secondary | ICD-10-CM | POA: Diagnosis not present

## 2015-09-18 DIAGNOSIS — M5407 Panniculitis affecting regions of neck and back, lumbosacral region: Secondary | ICD-10-CM | POA: Diagnosis not present

## 2015-09-18 DIAGNOSIS — G2582 Stiff-man syndrome: Secondary | ICD-10-CM | POA: Diagnosis not present

## 2015-09-22 DIAGNOSIS — M5407 Panniculitis affecting regions of neck and back, lumbosacral region: Secondary | ICD-10-CM | POA: Diagnosis not present

## 2015-09-22 DIAGNOSIS — G2582 Stiff-man syndrome: Secondary | ICD-10-CM | POA: Diagnosis not present

## 2015-09-22 DIAGNOSIS — F419 Anxiety disorder, unspecified: Secondary | ICD-10-CM | POA: Diagnosis not present

## 2015-09-22 DIAGNOSIS — R296 Repeated falls: Secondary | ICD-10-CM | POA: Diagnosis not present

## 2015-09-22 DIAGNOSIS — I129 Hypertensive chronic kidney disease with stage 1 through stage 4 chronic kidney disease, or unspecified chronic kidney disease: Secondary | ICD-10-CM | POA: Diagnosis not present

## 2015-09-22 DIAGNOSIS — N182 Chronic kidney disease, stage 2 (mild): Secondary | ICD-10-CM | POA: Diagnosis not present

## 2015-09-23 ENCOUNTER — Telehealth: Payer: Self-pay | Admitting: Internal Medicine

## 2015-09-23 MED ORDER — PREDNISONE 10 MG PO TABS: ORAL_TABLET | ORAL | Status: DC

## 2015-09-23 NOTE — Telephone Encounter (Signed)
Called made pt aware of recs. RX sent in. Nothing further needed 

## 2015-09-23 NOTE — Telephone Encounter (Signed)
Offer prednisone 10 mg, # 10, 2 daily x 3 days, then one daily.

## 2015-09-23 NOTE — Telephone Encounter (Signed)
Spoke with pt. She reports on Monday she had slight swelling under left eye, had 1 hive under eye and on left arm. Tuesday her face started itching. Now her whole face is swollen but the hive is gone on her arm. She found ovace face wash and it has helped with the itching. She is taking benadryl 2 qam and 2 qhs, clairitin, singulair. Please advise Dr. Maple Hudson thanks  Allergies  Allergen Reactions  . Ibuprofen Other (See Comments)    Does not take due to hx of renal insufficiency  . Tylenol [Acetaminophen] Hives    Cannot take large quantities  . Amoxicillin Diarrhea     Current Outpatient Prescriptions on File Prior to Visit  Medication Sig Dispense Refill  . albuterol (PROVENTIL HFA;VENTOLIN HFA) 108 (90 BASE) MCG/ACT inhaler Inhale 1 puff into the lungs every 6 (six) hours as needed. PRO AIR    . baclofen (LIORESAL) 20 MG tablet 20 mg 3 (three) times daily.     . cholecalciferol (VITAMIN D) 1000 UNITS tablet Take 1,000 Units by mouth daily.    . cyanocobalamin (,VITAMIN B-12,) 1000 MCG/ML injection Inject 1,000 mcg into the muscle every 3 (three) months.     . cyclobenzaprine (FLEXERIL) 10 MG tablet Take 10 mg by mouth at bedtime.     [name] desloratadine (CLARINEX) 5 MG tablet Take 1 tablet (5 mg total) by mouth daily. 30 tablet 0  . diazepam (VALIUM) 10 MG tablet Take 2 tablets (20 mg total) by mouth 3 (three) times daily. 540 tablet 1  . diclofenac sodium (VOLTAREN) 1 % GEL Apply 2 g topically 4 (four) times daily. 1 Tube 0  . diphenhydrAMINE (BENADRYL) 25 MG tablet Take 25 mg by mouth at bedtime as needed.    [name] EPIPEN 2-PAK 0.3 MG/0.3ML SOAJ injection USE AS DIRECTED FOR  SEVERE  ALLERGIC  REACTIONS 1 Device 0  . famotidine (PEPCID) 20 MG tablet Take 20 mg by mouth daily.    . Fluticasone-Salmeterol (ADVAIR) 250-50 MCG/DOSE AEPB Inhale 1 puff into the lungs 2 (two) times daily.     [name] gabapentin (NEURONTIN) 300 MG capsule Take 300 mg by mouth 3 (three) times daily.     [name]  HYDROcodone-acetaminophen (NORCO) 7.5-325 MG per tablet Take 1 tablet by mouth every 6 (six) hours as needed for moderate pain or severe pain. 15 tablet 0  . levocetirizine (XYZAL) 5 MG tablet Take 1 tablet (5 mg total) by mouth every evening. 30 tablet 0  . lidocaine (LIDODERM) 5 % Place 1 patch onto the skin daily. Remove & Discard patch within 12 hours or as directed by MD (Patient not taking: Reported on 07/12/2015) 30 patch 0  . loratadine (CLARITIN) 10 MG tablet Take 10 mg by mouth 2 (two) times daily.    . meloxicam (MOBIC) 7.5 MG tablet Take 15 mg by mouth daily.    . montelukast (SINGULAIR) 10 MG tablet Take by mouth.     [name] omeprazole (PRILOSEC) 40 MG capsule Take 40 mg by mouth daily.    . traMADol (ULTRAM) 50 MG tablet Take 50 mg by mouth every 6 (six) hours as needed.     No current facility-administered medications on file prior to visit.

## 2015-09-24 DIAGNOSIS — N182 Chronic kidney disease, stage 2 (mild): Secondary | ICD-10-CM | POA: Diagnosis not present

## 2015-09-24 DIAGNOSIS — I129 Hypertensive chronic kidney disease with stage 1 through stage 4 chronic kidney disease, or unspecified chronic kidney disease: Secondary | ICD-10-CM | POA: Diagnosis not present

## 2015-09-24 DIAGNOSIS — F419 Anxiety disorder, unspecified: Secondary | ICD-10-CM | POA: Diagnosis not present

## 2015-09-24 DIAGNOSIS — M5407 Panniculitis affecting regions of neck and back, lumbosacral region: Secondary | ICD-10-CM | POA: Diagnosis not present

## 2015-09-24 DIAGNOSIS — G2582 Stiff-man syndrome: Secondary | ICD-10-CM | POA: Diagnosis not present

## 2015-09-24 DIAGNOSIS — R296 Repeated falls: Secondary | ICD-10-CM | POA: Diagnosis not present

## 2015-09-28 DIAGNOSIS — G2582 Stiff-man syndrome: Secondary | ICD-10-CM | POA: Diagnosis not present

## 2015-09-28 DIAGNOSIS — N182 Chronic kidney disease, stage 2 (mild): Secondary | ICD-10-CM | POA: Diagnosis not present

## 2015-09-28 DIAGNOSIS — R296 Repeated falls: Secondary | ICD-10-CM | POA: Diagnosis not present

## 2015-09-28 DIAGNOSIS — F419 Anxiety disorder, unspecified: Secondary | ICD-10-CM | POA: Diagnosis not present

## 2015-09-28 DIAGNOSIS — M5407 Panniculitis affecting regions of neck and back, lumbosacral region: Secondary | ICD-10-CM | POA: Diagnosis not present

## 2015-09-28 DIAGNOSIS — I129 Hypertensive chronic kidney disease with stage 1 through stage 4 chronic kidney disease, or unspecified chronic kidney disease: Secondary | ICD-10-CM | POA: Diagnosis not present

## 2015-09-29 DIAGNOSIS — M6281 Muscle weakness (generalized): Secondary | ICD-10-CM | POA: Diagnosis not present

## 2015-09-29 DIAGNOSIS — M545 Low back pain: Secondary | ICD-10-CM | POA: Diagnosis not present

## 2015-09-29 DIAGNOSIS — R2681 Unsteadiness on feet: Secondary | ICD-10-CM | POA: Diagnosis not present

## 2015-10-05 DIAGNOSIS — M545 Low back pain: Secondary | ICD-10-CM | POA: Diagnosis not present

## 2015-10-05 DIAGNOSIS — M6281 Muscle weakness (generalized): Secondary | ICD-10-CM | POA: Diagnosis not present

## 2015-10-05 DIAGNOSIS — R2681 Unsteadiness on feet: Secondary | ICD-10-CM | POA: Diagnosis not present

## 2015-10-07 DIAGNOSIS — M6281 Muscle weakness (generalized): Secondary | ICD-10-CM | POA: Diagnosis not present

## 2015-10-07 DIAGNOSIS — R2681 Unsteadiness on feet: Secondary | ICD-10-CM | POA: Diagnosis not present

## 2015-10-07 DIAGNOSIS — M545 Low back pain: Secondary | ICD-10-CM | POA: Diagnosis not present

## 2015-10-11 ENCOUNTER — Encounter: Payer: Self-pay | Admitting: Hematology & Oncology

## 2015-10-11 ENCOUNTER — Other Ambulatory Visit (HOSPITAL_BASED_OUTPATIENT_CLINIC_OR_DEPARTMENT_OTHER): Payer: Medicare Other

## 2015-10-11 ENCOUNTER — Ambulatory Visit (HOSPITAL_BASED_OUTPATIENT_CLINIC_OR_DEPARTMENT_OTHER): Payer: Federal, State, Local not specified - PPO

## 2015-10-11 ENCOUNTER — Ambulatory Visit (HOSPITAL_BASED_OUTPATIENT_CLINIC_OR_DEPARTMENT_OTHER): Payer: Medicare Other | Admitting: Hematology & Oncology

## 2015-10-11 VITALS — BP 123/75 | HR 120 | Temp 99.4°F | Resp 18 | Ht 63.0 in | Wt 151.0 lb

## 2015-10-11 DIAGNOSIS — E038 Other specified hypothyroidism: Secondary | ICD-10-CM

## 2015-10-11 DIAGNOSIS — G2582 Stiff-man syndrome: Secondary | ICD-10-CM | POA: Diagnosis not present

## 2015-10-11 DIAGNOSIS — D510 Vitamin B12 deficiency anemia due to intrinsic factor deficiency: Secondary | ICD-10-CM | POA: Diagnosis not present

## 2015-10-11 DIAGNOSIS — M810 Age-related osteoporosis without current pathological fracture: Secondary | ICD-10-CM

## 2015-10-11 DIAGNOSIS — D509 Iron deficiency anemia, unspecified: Secondary | ICD-10-CM

## 2015-10-11 LAB — IRON AND TIBC CHCC
%SAT: 5 % — ABNORMAL LOW (ref 21–57)
IRON: 11 ug/dL — AB (ref 41–142)
TIBC: 210 ug/dL — ABNORMAL LOW (ref 236–444)
UIBC: 199 ug/dL (ref 120–384)

## 2015-10-11 LAB — CBC WITH DIFFERENTIAL (CANCER CENTER ONLY)
BASO#: 0 10*3/uL (ref 0.0–0.2)
BASO%: 0 % (ref 0.0–2.0)
EOS ABS: 0 10*3/uL (ref 0.0–0.5)
EOS%: 0 % (ref 0.0–7.0)
HCT: 34.8 % (ref 34.8–46.6)
HGB: 11.4 g/dL — ABNORMAL LOW (ref 11.6–15.9)
LYMPH#: 1.3 10*3/uL (ref 0.9–3.3)
LYMPH%: 12.6 % — AB (ref 14.0–48.0)
MCH: 28.1 pg (ref 26.0–34.0)
MCHC: 32.8 g/dL (ref 32.0–36.0)
MCV: 86 fL (ref 81–101)
MONO#: 0.7 10*3/uL (ref 0.1–0.9)
MONO%: 6.4 % (ref 0.0–13.0)
NEUT#: 8.6 10*3/uL — ABNORMAL HIGH (ref 1.5–6.5)
NEUT%: 81 % — ABNORMAL HIGH (ref 39.6–80.0)
PLATELETS: 231 10*3/uL (ref 145–400)
RBC: 4.06 10*6/uL (ref 3.70–5.32)
RDW: 12.5 % (ref 11.1–15.7)
WBC: 10.6 10*3/uL — AB (ref 3.9–10.0)

## 2015-10-11 LAB — COMPREHENSIVE METABOLIC PANEL (CC13)
ALT: 14 U/L (ref 0–55)
AST: 31 U/L (ref 5–34)
Albumin: 3.5 g/dL (ref 3.5–5.0)
Alkaline Phosphatase: 85 U/L (ref 40–150)
Anion Gap: 8 mEq/L (ref 3–11)
BILIRUBIN TOTAL: 0.44 mg/dL (ref 0.20–1.20)
BUN: 9.9 mg/dL (ref 7.0–26.0)
CALCIUM: 9.7 mg/dL (ref 8.4–10.4)
CO2: 27 mEq/L (ref 22–29)
CREATININE: 1.1 mg/dL (ref 0.6–1.1)
Chloride: 104 mEq/L (ref 98–109)
EGFR: 65 mL/min/{1.73_m2} — AB (ref >90)
Glucose: 158 mg/dl — ABNORMAL HIGH (ref 70–140)
Potassium: 3.1 mEq/L — ABNORMAL LOW (ref 3.5–5.1)
Sodium: 139 mEq/L (ref 136–145)
Total Protein: 7.3 g/dL (ref 6.4–8.3)

## 2015-10-11 LAB — FERRITIN CHCC: FERRITIN: 429 ng/mL — AB (ref 9–269)

## 2015-10-11 LAB — VITAMIN B12: VITAMIN B 12: 376 pg/mL (ref 211–911)

## 2015-10-11 MED ORDER — CYANOCOBALAMIN 1000 MCG/ML IJ SOLN
INTRAMUSCULAR | Status: AC
  Filled 2015-10-11: qty 1

## 2015-10-11 MED ORDER — CYANOCOBALAMIN 1000 MCG/ML IJ SOLN
1000.0000 ug | Freq: Once | INTRAMUSCULAR | Status: AC
  Administered 2015-10-11: 1000 ug via INTRAMUSCULAR

## 2015-10-11 NOTE — Progress Notes (Signed)
Hematology and Oncology Follow Up Visit  Sherri [name] [id number] April 18, 1966 49 y.o. 10/11/2015   Principle Diagnosis:  1. Pernicious anemia - anti-intrinsic factor antibodies.  2. Intermittent iron-deficiency anemia.  4. 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   Current Therapy:   1. Vitamin B12, 1 mg IM q.4 months.  2. IV iron as indicated.    Interim History: [name] is here for a follow-up today. She feels tired. She is felt this way for about a week or so. We have not given her iron a long time. Her B-12 levels have been okay. Her B12 was 446 back in June. Her iron studies showed a ferritin of 360 with iron saturation of 44%. This also was in June.  She's having some sciatic-type radicular pain down her right leg. She has had this issue before.  She is on chronic pain medication.  I will have to let her family doctor with this issue.  She's had no bleeding. She has had no infections.   There has  been no cough. She's had no shortness of breath.,   Her appetite is okay. There is no nausea or vomiting.  She is doing with 2 grandchildren. Her daughter has had them. Her daughter currently is going to school.   Medications:    Medication List       This list is accurate as of: 10/11/15 12:03 PM.  Always use your most recent med list.               albuterol 108 (90 BASE) MCG/ACT inhaler  Commonly known as:  PROVENTIL HFA;VENTOLIN HFA  Inhale 1 puff into the lungs every 6 (six) hours as needed. PRO AIR     baclofen 20 MG tablet  Commonly known as:  LIORESAL  20 mg 3 (three) times daily.     cholecalciferol 1000 UNITS tablet  Commonly known as:  VITAMIN D  Take 1,000 Units by mouth daily.     cyanocobalamin 1000 MCG/ML injection  Commonly known as:  (VITAMIN B-12)  Inject 1,000 mcg into the muscle every 3 (three) months.     cyclobenzaprine 10 MG tablet  Commonly known as:  FLEXERIL  Take 10 mg by mouth at bedtime.     desloratadine 5  MG tablet  Commonly known as:  CLARINEX  Take 1 tablet (5 mg total) by mouth daily.     diazepam 10 MG tablet  Commonly known as:  VALIUM  Take 2 tablets (20 mg total) by mouth 3 (three) times daily.     diclofenac sodium 1 % Gel  Commonly known as:  VOLTAREN  Apply 2 g topically 4 (four) times daily.     diphenhydrAMINE 25 MG tablet  Commonly known as:  BENADRYL  Take 25 mg by mouth at bedtime as needed.     EPIPEN 2-PAK 0.3 mg/0.3 mL Soaj injection  Generic drug:  EPINEPHrine  USE AS DIRECTED FOR  SEVERE  ALLERGIC  REACTIONS     famotidine 20 MG tablet  Commonly known as:  PEPCID  Take 20 mg by mouth daily.     Fluticasone-Salmeterol 250-50 MCG/DOSE Aepb  Commonly known as:  ADVAIR  Inhale 1 puff into the lungs 2 (two) times daily.     gabapentin 300 MG capsule  Commonly known as:  NEURONTIN  Take 300 mg by mouth 3 (three) times daily.     levocetirizine 5 MG tablet  Commonly known as:  XYZAL  Take 1 tablet (  5 mg total) by mouth every evening.     lidocaine 5 %  Commonly known as:  LIDODERM  Place 1 patch onto the skin daily. Remove & Discard patch within 12 hours or as directed by MD     loratadine 10 MG tablet  Commonly known as:  CLARITIN  Take 10 mg by mouth 2 (two) times daily.     meloxicam 7.5 MG tablet  Commonly known as:  MOBIC  Take 15 mg by mouth daily.     montelukast 10 MG tablet  Commonly known as:  SINGULAIR  Take by mouth.     omeprazole 40 MG capsule  Commonly known as:  PRILOSEC  Take 40 mg by mouth daily.     oxyCODONE 5 MG immediate release tablet  Commonly known as:  Oxy IR/ROXICODONE  TAKE 1 TO 2 TABLETS EVERY DAY AS NEEDED FOR PAIN     predniSONE 10 MG tablet  Commonly known as:  DELTASONE  Take 2 tabs daily x 3 days, then once daily     traMADol 50 MG tablet  Commonly known as:  ULTRAM  Take 50 mg by mouth every 6 (six) hours as needed.        Allergies:  Allergies  Allergen Reactions  . Ibuprofen Other (See Comments)     Does not take due to hx of renal insufficiency  . Tylenol [Acetaminophen] Hives    Cannot take large quantities  . Amoxicillin Diarrhea    Past Medical History, Surgical history, Social history, and Family History were reviewed and updated.  Review of Systems: All other 10 point review of systems is negative.   Physical Exam:  height is  (1.6 m) and weight is 151 lb (68.493 kg). Her oral temperature is 99.4 F (37.4 C). Her blood pressure is 123/75 and her pulse is 120. Her respiration is 18.   Wt Readings from Last 3 Encounters:  10/11/15 151 lb (68.493 kg)  07/12/15 160 lb 8 oz (72.802 kg)  03/10/15 157 lb (71.215 kg)    Petite African-American female. Head and neck exam shows no ocular or oral lesions. There is no scleral icterus. Lungs are clear. Cardiac exam regular rate and rhythm with no murmurs, rubs or bruits. Abdomen is soft. She has good bowel sounds. There is no fluid wave. There is no palpable liver or spleen tip. Back exam shows no tenderness over the spine, ribs or hips. Extremities shows no clubbing, cyanosis or edema. No weakness is noted in the legs. Skin exam shows no rashes. Neurological exam shows no focal neurological deficits.:   Lab Results  Component Value Date   WBC 10.6* 10/11/2015   HGB 11.4* 10/11/2015   HCT 34.8 10/11/2015   MCV 86 10/11/2015   PLT 231 10/11/2015   Lab Results  Component Value Date   FERRITIN 360* 06/09/2015   IRON 102 06/09/2015   TIBC 231* 06/09/2015   UIBC 130 06/09/2015   IRONPCTSAT 44 06/09/2015   Lab Results  Component Value Date   RETICCTPCT 1.9 06/02/2014   RBC 4.06 10/11/2015   RETICCTABS 79.0 06/02/2014   Lab Results  Component Value Date   KPAFRELGTCHN 1.06 08/02/2011   LAMBDASER 1.49 08/02/2011   KAPLAMBRATIO 0.71 08/02/2011   Lab Results  Component Value Date   IGGSERUM 1180 08/02/2011   IGA 206 08/02/2011   IGMSERUM 198 08/02/2011   Lab Results  Component Value Date   TOTALPROTELP 6.6  08/02/2011   ALBUMINELP 57.1 08/02/2011  A1GS 4.1 08/02/2011   A2GS 9.6 08/02/2011   BETS 6.1 08/02/2011   BETA2SER 5.9 08/02/2011   GAMS 17.2 08/02/2011   MSPIKE NOT DET 08/02/2011   SPEI * 08/02/2011     Chemistry      Component Value Date/Time   NA 145 06/09/2015 1022   NA 146* 03/10/2015 1021   K 4.3 06/09/2015 1022   K 3.8 03/10/2015 1021   CL 107 06/09/2015 1022   CL 106 03/10/2015 1021   CO2 25 06/09/2015 1022   CO2 28 03/10/2015 1021   BUN 11 06/09/2015 1022   BUN 14 03/10/2015 1021   CREATININE 0.96 06/09/2015 1022   CREATININE 0.9 03/10/2015 1021      Component Value Date/Time   CALCIUM 9.8 06/09/2015 1022   CALCIUM 9.1 03/10/2015 1021   ALKPHOS 91 06/09/2015 1022   ALKPHOS 94* 03/10/2015 1021   AST 14 06/09/2015 1022   AST 20 03/10/2015 1021   ALT 9 06/09/2015 1022   ALT 14 03/10/2015 1021   BILITOT 0.3 06/09/2015 1022   BILITOT 0.40 03/10/2015 1021     Impression and Plan: [name] is 49 year old African American female with pernicious anemia and intermittent iron deficiency anemia. I just wonder if she might not be iron deficient.   She does have some autoimmune issues. It is possible that she may also have some hypothyroidism. Her thyroid levels were checked back in December of last year. Everything looked okay.  We will see what her iron studies show.  She may need MRI of the back. His only she has radicular type pain down the right leg. I will have to let her family doctor deal with this issue.  We will give her the B-12 today.   Josph MachoENNEVER,PETER R, MD 10/17/201612:03 PM

## 2015-10-11 NOTE — Patient Instructions (Signed)

## 2015-10-12 ENCOUNTER — Telehealth: Payer: Self-pay | Admitting: *Deleted

## 2015-10-12 ENCOUNTER — Other Ambulatory Visit: Payer: Self-pay | Admitting: Family

## 2015-10-12 NOTE — Telephone Encounter (Addendum)
Patient aware of results. She wants to wait for scheduler to call her to set up appointments.   ----- Message from Verdie MosherSarah M [place], NP sent at 10/12/2015 10:44 AM EDT ----- Regarding: Iron Please let her know her iron is low and Anda will be calling her to set up 2 appointments for Fera[facility]me. Thank you!!!    ----- Message -----    From: [facility] Interface    Sent: 10/11/2015  11:14 AM      To: Verdie MosherSarah M [place], NP

## 2015-10-14 ENCOUNTER — Ambulatory Visit (HOSPITAL_BASED_OUTPATIENT_CLINIC_OR_DEPARTMENT_OTHER): Payer: Medicare Other

## 2015-10-14 ENCOUNTER — Other Ambulatory Visit: Payer: Self-pay | Admitting: Family

## 2015-10-14 VITALS — BP 92/61 | HR 92 | Temp 98.4°F | Resp 18

## 2015-10-14 DIAGNOSIS — D509 Iron deficiency anemia, unspecified: Secondary | ICD-10-CM | POA: Diagnosis present

## 2015-10-14 DIAGNOSIS — D510 Vitamin B12 deficiency anemia due to intrinsic factor deficiency: Secondary | ICD-10-CM

## 2015-10-14 DIAGNOSIS — D649 Anemia, unspecified: Secondary | ICD-10-CM

## 2015-10-14 MED ORDER — FERUMOXYTOL INJECTION 510 MG/17 ML
510.0000 mg | Freq: Once | INTRAVENOUS | Status: AC
Start: 2015-10-14 — End: 2015-10-14
  Administered 2015-10-14: 510 mg via INTRAVENOUS
  Filled 2015-10-14: qty 17

## 2015-10-14 MED ORDER — SODIUM CHLORIDE 0.9 % IV SOLN: 510.0000 mg | Freq: Once | INTRAVENOUS | Status: DC

## 2015-10-14 NOTE — Patient Instructions (Signed)

## 2015-10-19 ENCOUNTER — Other Ambulatory Visit: Payer: Self-pay | Admitting: Neurology

## 2015-10-19 NOTE — Telephone Encounter (Signed)
Pt called requesting refill for diazepam (VALIUM) 10 MG tablet . I relayed to pt [facility] had requested refill. Pt understood.

## 2015-10-20 NOTE — Telephone Encounter (Signed)
Rx has been signed and faxed  

## 2015-10-22 ENCOUNTER — Ambulatory Visit (HOSPITAL_BASED_OUTPATIENT_CLINIC_OR_DEPARTMENT_OTHER): Payer: Medicare Other

## 2015-10-22 VITALS — BP 115/69 | HR 95 | Temp 98.0°F | Resp 18

## 2015-10-22 DIAGNOSIS — D509 Iron deficiency anemia, unspecified: Secondary | ICD-10-CM | POA: Diagnosis present

## 2015-10-22 DIAGNOSIS — D510 Vitamin B12 deficiency anemia due to intrinsic factor deficiency: Secondary | ICD-10-CM

## 2015-10-22 MED ORDER — SODIUM CHLORIDE 0.9 % IV SOLN
510.0000 mg | Freq: Once | INTRAVENOUS | Status: AC
  Administered 2015-10-22: 510 mg via INTRAVENOUS
  Filled 2015-10-22: qty 17

## 2015-10-22 MED ORDER — SODIUM CHLORIDE 0.9 % IV SOLN
INTRAVENOUS | Status: DC
  Administered 2015-10-22: 14:00:00 via INTRAVENOUS

## 2015-10-22 NOTE — Patient Instructions (Signed)

## 2015-10-26 DIAGNOSIS — F411 Generalized anxiety disorder: Secondary | ICD-10-CM | POA: Diagnosis not present

## 2015-10-26 DIAGNOSIS — F410 Panic disorder [episodic paroxysmal anxiety] without agoraphobia: Secondary | ICD-10-CM | POA: Diagnosis not present

## 2015-10-27 DIAGNOSIS — R2681 Unsteadiness on feet: Secondary | ICD-10-CM | POA: Diagnosis not present

## 2015-10-27 DIAGNOSIS — M6281 Muscle weakness (generalized): Secondary | ICD-10-CM | POA: Diagnosis not present

## 2015-10-27 DIAGNOSIS — M545 Low back pain: Secondary | ICD-10-CM | POA: Diagnosis not present

## 2015-11-19 ENCOUNTER — Emergency Department (INDEPENDENT_AMBULATORY_CARE_PROVIDER_SITE_OTHER)
Admission: EM | Admit: 2015-11-19 | Discharge: 2015-11-19 | Disposition: A | Discharge status: Home / Self Care | Payer: Medicare Other | Source: Home / Self Care | Attending: Family Medicine | Admitting: Family Medicine

## 2015-11-19 ENCOUNTER — Emergency Department (INDEPENDENT_AMBULATORY_CARE_PROVIDER_SITE_OTHER): Payer: Medicare Other

## 2015-11-19 ENCOUNTER — Encounter (HOSPITAL_COMMUNITY): Payer: Self-pay | Admitting: Emergency Medicine

## 2015-11-19 DIAGNOSIS — R05 Cough: Secondary | ICD-10-CM | POA: Diagnosis not present

## 2015-11-19 DIAGNOSIS — J040 Acute laryngitis: Secondary | ICD-10-CM

## 2015-11-19 DIAGNOSIS — J4531 Mild persistent asthma with (acute) exacerbation: Secondary | ICD-10-CM

## 2015-11-19 DIAGNOSIS — R0982 Postnasal drip: Secondary | ICD-10-CM

## 2015-11-19 DIAGNOSIS — J3489 Other specified disorders of nose and nasal sinuses: Secondary | ICD-10-CM | POA: Diagnosis not present

## 2015-11-19 MED ORDER — PREDNISONE 20 MG PO TABS: ORAL_TABLET | ORAL | Status: DC

## 2015-11-19 NOTE — Discharge Instructions (Signed)
Asthma, Acute Bronchospasm Continue taking the an histamines, Claritin. Start using her albuterol inhaler 2 puffs every 4-6 hours as needed for cough and wheezing Start prednisone taper dose as directed. Continue with the Singulair. Drink plenty of fluids and stay well hydrated Acute bronchospasm caused by asthma is also referred to as an asthma attack. Bronchospasm means your air passages become narrowed. The narrowing is caused by inflammation and tightening of the muscles in the air tubes (bronchi) in your lungs. This can make it hard to breathe or cause you to wheeze and cough. CAUSES Possible triggers are:  Animal dander from the skin, hair, or feathers of animals.  Dust mites contained in house dust.  Cockroaches.  Pollen from trees or grass.  Mold.  Cigarette or tobacco smoke.  Air pollutants such as dust, household cleaners, hair sprays, aerosol sprays, paint fumes, strong chemicals, or strong odors.  Cold air or weather changes. Cold air may trigger inflammation. Winds increase molds and pollens in the air.  Strong emotions such as crying or laughing hard.  Stress.  Certain medicines such as aspirin or beta-blockers.  Sulfites in foods and drinks, such as dried fruits and wine.  Infections or inflammatory conditions, such as a flu, cold, or inflammation of the nasal membranes (rhinitis).  Gastroesophageal reflux disease (GERD). GERD is a condition where stomach acid backs up into your esophagus.  Exercise or strenuous activity. SIGNS AND SYMPTOMS   Wheezing.  Excessive coughing, particularly at night.  Chest tightness.  Shortness of breath. DIAGNOSIS  Your health care provider will ask you about your medical history and perform a physical exam. A chest X-ray or blood testing may be performed to look for other causes of your symptoms or other conditions that may have triggered your asthma attack. TREATMENT  Treatment is aimed at reducing inflammation and  opening up the airways in your lungs. Most asthma attacks are treated with inhaled medicines. These include quick relief or rescue medicines (such as bronchodilators) and controller medicines (such as inhaled corticosteroids). These medicines are sometimes given through an inhaler or a nebulizer. Systemic steroid medicine taken by mouth or given through an IV tube also can be used to reduce the inflammation when an attack is moderate or severe. Antibiotic medicines are only used if a bacterial infection is present.  HOME CARE INSTRUCTIONS   Rest.  Drink plenty of liquids. This helps the mucus to remain thin and be easily coughed up. Only use caffeine in moderation and do not use alcohol until you have recovered from your illness.  Do not smoke. Avoid being exposed to secondhand smoke.  You play a critical role in keeping yourself in good health. Avoid exposure to things that cause you to wheeze or to have breathing problems.  Keep your medicines up-to-date and available. Carefully follow your health care provider's treatment plan.  Take your medicine exactly as prescribed.  When pollen or pollution is bad, keep windows closed and use an air conditioner or go to places with air conditioning.  Asthma requires careful medical care. See your health care provider for a follow-up as advised. If you are more than [redacted] weeks pregnant and you were prescribed any new medicines, let your obstetrician know about the visit and how you are doing. Follow up with your health care provider as directed.  After you have recovered from your asthma attack, make an appointment with your outpatient doctor to talk about ways to reduce the likelihood of future attacks. If you do not  have a doctor who manages your asthma, make an appointment with a primary care doctor to discuss your asthma. SEEK IMMEDIATE MEDICAL CARE IF:   You are getting worse.  You have trouble breathing. If severe, call your local emergency  services (911 in the [place]  You develop chest pain or discomfort.  You are vomiting.  You are not able to keep fluids down.  You are coughing up yellow, green, brown, or bloody sputum.  You have a fever and your symptoms suddenly get worse.  You have trouble swallowing. MAKE SURE YOU:   Understand these instructions.  Will watch your condition.  Will get help right away if you are not doing well or get worse.   This information is not intended to replace advice given to you by your health care provider. Make sure you discuss any questions you have with your health care provider.   Document Released: 03/28/2007 Document Revised: 12/16/2013 Document Reviewed: 06/18/2013 Elsevier Interactive Patient Education [employer]2016 [employer] Inc.

## 2015-11-19 NOTE — ED Notes (Signed)
C/o cold sx onset 2 weeks associated w/prod cough, wheezing, chills, and hoarseness Denies fevers A&O x4... No acute distress.

## 2015-11-19 NOTE — ED Provider Notes (Signed)
CSN: [id number]     Arrival date & time 11/19/15  1300 History   First MD Initiated Contact with Patient 11/19/15 1316     Chief Complaint  Patient presents with  . URI   (Consider location/radiation/quality/duration/timing/severity/associated sxs/prior Treatment) HPI Comments: 49 year old female complaining of laryngitis with shortness of breath and yellow sputum for at least 2 weeks. She has a history of asthma and has been using her albuterol HFA about twice a day. Is also complaining of cough which is producing yellow sputum. Denies PND, runny nose or sneezing. Denies fever.   Past Medical History  Diagnosis Date  . Pernicious anemia 10/30/2011  . Lumbar herniated disc   . Environmental allergies   . Angioedema   . DVT (deep venous thrombosis) (HCC)   . Asthma   . Seasonal allergies   . Urticaria   . Chronic back pain   . Anxiety   . Depression   . GERD (gastroesophageal reflux disease)   . Arthritis   . Chronic kidney disease   . Peripheral vascular disease (HCC)   . CAP (community acquired pneumonia) 08/29/2012  . Stiff person syndrome   . Hiatal hernia 03/21/2013  . Cocaine abuse   . Postconcussion syndrome 12/30/2014   Past Surgical History  Procedure Laterality Date  . Inguinal hernia 1983  1983  . Colonoscopy  07/09/15   Family History  Problem Relation Age of Onset  . Cancer Mother   . Other Mother   . COPD Father   . Asthma Brother   . Cancer Brother     colon  . Heart attack Brother   . Seizures Brother    Social History  Substance Use Topics  . Smoking status: Never Smoker   . Smokeless tobacco: Never Used     Comment: Never Used Tobacco  . Alcohol Use: 0.0 oz/week    0 Standard drinks or equivalent per week     Comment: Occasional   OB History    No data available     Review of Systems  Constitutional: Positive for activity change. Negative for fever and chills.  HENT: Positive for voice change. Negative for congestion, postnasal drip,  rhinorrhea, sneezing and sore throat.   Respiratory: Positive for cough, shortness of breath and wheezing.   Cardiovascular: Negative.   Skin: Negative.   All other systems reviewed and are negative.   Allergies  Ibuprofen; Tylenol; and Amoxicillin  Home Medications   Prior to Admission medications   Medication Sig Start Date End Date Taking? Authorizing Provider  albuterol (PROVENTIL HFA;VENTOLIN HFA) 108 (90 BASE) MCG/ACT inhaler Inhale 1 puff into the lungs every 6 (six) hours as needed. PRO AIR 06/20/13  Yes Waymon Budgelinton D Young, MD  baclofen (LIORESAL) 20 MG tablet 20 mg 3 (three) times daily.  10/19/12  Yes Historical Provider, MD  cholecalciferol (VITAMIN D) 1000 UNITS tablet Take 1,000 Units by mouth daily.   Yes Historical Provider, MD  cyclobenzaprine (FLEXERIL) 10 MG tablet Take 10 mg by mouth at bedtime.  10/21/12  Yes Historical Provider, MD  desloratadine (CLARINEX) 5 MG tablet Take 1 tablet (5 mg total) by mouth daily. 11/06/11  Yes Waymon Budgelinton D Young, MD  diazepam (VALIUM) 10 MG tablet TAKE TWO TABLETS BY MOUTH THREE TIMES DAILY 10/19/15  Yes York Spanielharles K Willis, MD  gabapentin (NEURONTIN) 300 MG capsule Take 300 mg by mouth 3 (three) times daily.    Yes Historical Provider, MD  levocetirizine (XYZAL) 5 MG tablet Take 1 tablet (5 mg  total) by mouth every evening. 11/06/11  Yes Waymon Budge, MD  loratadine (CLARITIN) 10 MG tablet Take 10 mg by mouth 2 (two) times daily.   Yes Historical Provider, MD  oxyCODONE (OXY IR/ROXICODONE) 5 MG immediate release tablet TAKE 1 TO 2 TABLETS EVERY DAY AS NEEDED FOR PAIN 08/25/15  Yes Historical Provider, MD  traMADol (ULTRAM) 50 MG tablet Take 50 mg by mouth every 6 (six) hours as needed.   Yes Historical Provider, MD  cyanocobalamin (,VITAMIN B-12,) 1000 MCG/ML injection Inject 1,000 mcg into the muscle every 3 (three) months.     Historical Provider, MD  diclofenac sodium (VOLTAREN) 1 % GEL Apply 2 g topically 4 (four) times daily. 10/02/14    Ozella Rocks, MD  diphenhydrAMINE (BENADRYL) 25 MG tablet Take 25 mg by mouth at bedtime as needed.    Historical Provider, MD  EPIPEN 2-PAK 0.3 MG/0.3ML SOAJ injection USE AS DIRECTED FOR  SEVERE  ALLERGIC  REACTIONS 08/16/15   Waymon Budge, MD  famotidine (PEPCID) 20 MG tablet Take 20 mg by mouth daily.    Historical Provider, MD  Fluticasone-Salmeterol (ADVAIR) 250-50 MCG/DOSE AEPB Inhale 1 puff into the lungs 2 (two) times daily.  03/14/12   Waymon Budge, MD  meloxicam (MOBIC) 7.5 MG tablet Take 15 mg by mouth daily. 10/14/14   Historical Provider, MD  montelukast (SINGULAIR) 10 MG tablet Take by mouth.     Historical Provider, MD  omeprazole (PRILOSEC) 40 MG capsule Take 40 mg by mouth daily.    Historical Provider, MD  predniSONE (DELTASONE) 20 MG tablet Take 3 tabs po on first day, 2 tabs second day, 2 tabs third day, 1 tab fourth day, 1 tab 5th day. Take with food. 11/19/15   Hayden Rasmussen, NP   Meds Ordered and Administered this Visit  Medications - No data to display  BP 113/80 mmHg  Pulse 100  Temp(Src) 98.2 F (36.8 C) (Oral)  Resp 16  SpO2 96% No data found.   Physical Exam  Constitutional: She appears well-developed and well-nourished. No distress.  HENT:  Bilateral TMs are normal Oropharynx with moderate amount of clear PND, cobblestoning and minor erythema. No exudates or swelling.  Eyes: Conjunctivae and EOM are normal.  Neck: Normal range of motion. Neck supple.  Cardiovascular: Normal rate, regular rhythm and normal heart sounds.   Pulmonary/Chest: Effort normal.  She expiratory wheezes bilaterally. Air movement is good.  Musculoskeletal: Normal range of motion.  Lymphadenopathy:    She has no cervical adenopathy.  Neurological: She is alert. She exhibits normal muscle tone.  Skin: Skin is warm and dry.  Psychiatric: She has a normal mood and affect.  Nursing note and vitals reviewed.   ED Course  Procedures (including critical care time)  Labs  Review Labs Reviewed - No data to display  Imaging Review Dg Chest 2 View  11/19/2015  CLINICAL DATA:  Cough, congestion, asthma and hoarseness 2 weeks. EXAM: CHEST  2 VIEW COMPARISON:  09/28/2014 FINDINGS: Lungs are adequately inflated and otherwise clear. Cardiomediastinal silhouette is within normal. Small hiatal hernia unchanged. Mild degenerate change of the spine as well as mild biphasic curvature of the thoracolumbar spine unchanged. IMPRESSION: No active cardiopulmonary disease. Electronically Signed   By: Elberta Fortis M.D.   On: 11/19/2015 13:51     Visual Acuity Review  Right Eye Distance:   Left Eye Distance:   Bilateral Distance:    Right Eye Near:   Left Eye Near:  Bilateral Near:         MDM   1. Sinus drainage   2. PND (post-nasal drip)   3. Laryngitis   4. Asthma exacerbation attacks, mild persistent     Continue taking the an histamines, Claritin. Start using her albuterol inhaler 2 puffs every 4-6 hours as needed for cough and wheezing Start prednisone taper dose as directed. Continue with the Singulair. Drink plenty of fluids and stay well hydrated    Hayden Rasmussen, NP 11/19/15 1426

## 2015-12-14 ENCOUNTER — Encounter: Payer: Self-pay | Admitting: Family

## 2015-12-14 ENCOUNTER — Other Ambulatory Visit (HOSPITAL_BASED_OUTPATIENT_CLINIC_OR_DEPARTMENT_OTHER): Payer: Medicare Other

## 2015-12-14 ENCOUNTER — Ambulatory Visit (HOSPITAL_BASED_OUTPATIENT_CLINIC_OR_DEPARTMENT_OTHER): Payer: Medicare Other

## 2015-12-14 ENCOUNTER — Ambulatory Visit (HOSPITAL_BASED_OUTPATIENT_CLINIC_OR_DEPARTMENT_OTHER): Payer: Medicare Other | Admitting: Family

## 2015-12-14 VITALS — BP 107/60 | HR 89 | Temp 97.7°F | Resp 14 | Ht 63.0 in | Wt 153.0 lb

## 2015-12-14 DIAGNOSIS — E038 Other specified hypothyroidism: Secondary | ICD-10-CM

## 2015-12-14 DIAGNOSIS — D510 Vitamin B12 deficiency anemia due to intrinsic factor deficiency: Secondary | ICD-10-CM

## 2015-12-14 DIAGNOSIS — M810 Age-related osteoporosis without current pathological fracture: Secondary | ICD-10-CM | POA: Diagnosis not present

## 2015-12-14 DIAGNOSIS — G2582 Stiff-man syndrome: Secondary | ICD-10-CM | POA: Diagnosis not present

## 2015-12-14 DIAGNOSIS — D509 Iron deficiency anemia, unspecified: Secondary | ICD-10-CM | POA: Diagnosis not present

## 2015-12-14 DIAGNOSIS — E018 Other iodine-deficiency related thyroid disorders and allied conditions: Secondary | ICD-10-CM | POA: Diagnosis not present

## 2015-12-14 LAB — COMPREHENSIVE METABOLIC PANEL
ALT: 14 U/L (ref 0–55)
AST: 17 U/L (ref 5–34)
Albumin: 3.7 g/dL (ref 3.5–5.0)
Alkaline Phosphatase: 104 U/L (ref 40–150)
Anion Gap: 9 mEq/L (ref 3–11)
BUN: 9.9 mg/dL (ref 7.0–26.0)
CO2: 26 mEq/L (ref 22–29)
Calcium: 9.7 mg/dL (ref 8.4–10.4)
Chloride: 108 mEq/L (ref 98–109)
Creatinine: 1.1 mg/dL (ref 0.6–1.1)
EGFR: 69 mL/min/{1.73_m2} — ABNORMAL LOW (ref >90)
Glucose: 109 mg/dl (ref 70–140)
Potassium: 3.8 mEq/L (ref 3.5–5.1)
Sodium: 143 mEq/L (ref 136–145)
Total Bilirubin: <0.3 mg/dL (ref 0.20–1.20)
Total Protein: 7.6 g/dL (ref 6.4–8.3)

## 2015-12-14 LAB — CBC WITH DIFFERENTIAL (CANCER CENTER ONLY)
BASO#: 0 10*3/uL (ref 0.0–0.2)
BASO%: 0 % (ref 0.0–2.0)
EOS ABS: 0 10*3/uL (ref 0.0–0.5)
EOS%: 0 % (ref 0.0–7.0)
HEMATOCRIT: 35.9 % (ref 34.8–46.6)
HGB: 11.4 g/dL — ABNORMAL LOW (ref 11.6–15.9)
LYMPH#: 1.3 10*3/uL (ref 0.9–3.3)
LYMPH%: 25 % (ref 14.0–48.0)
MCH: 27.9 pg (ref 26.0–34.0)
MCHC: 31.8 g/dL — AB (ref 32.0–36.0)
MCV: 88 fL (ref 81–101)
MONO#: 0.2 10*3/uL (ref 0.1–0.9)
MONO%: 4.5 % (ref 0.0–13.0)
NEUT#: 3.6 10*3/uL (ref 1.5–6.5)
NEUT%: 70.5 % (ref 39.6–80.0)
Platelets: 284 10*3/uL (ref 145–400)
RBC: 4.09 10*6/uL (ref 3.70–5.32)
RDW: 13.3 % (ref 11.1–15.7)
WBC: 5.1 10*3/uL (ref 3.9–10.0)

## 2015-12-14 LAB — IRON AND TIBC
%SAT: 40 % (ref 21–57)
Iron: 89 ug/dL (ref 41–142)
TIBC: 223 ug/dL — ABNORMAL LOW (ref 236–444)
UIBC: 134 ug/dL (ref 120–384)

## 2015-12-14 LAB — TSH: TSH: 1.176 m(IU)/L (ref 0.308–3.960)

## 2015-12-14 LAB — FERRITIN: Ferritin: 751 ng/ml — ABNORMAL HIGH (ref 9–269)

## 2015-12-14 MED ORDER — CYANOCOBALAMIN 1000 MCG/ML IJ SOLN
1000.0000 ug | Freq: Once | INTRAMUSCULAR | Status: AC
  Administered 2015-12-14: 1000 ug via INTRAMUSCULAR

## 2015-12-14 MED ORDER — CYANOCOBALAMIN 1000 MCG/ML IJ SOLN
INTRAMUSCULAR | Status: AC
  Filled 2015-12-14: qty 1

## 2015-12-14 NOTE — Patient Instructions (Signed)

## 2015-12-14 NOTE — Progress Notes (Signed)
Hematology and Oncology Follow Up Visit  Sherri SlimmerSamantha [name] [id number] 08-22-1966 49 y.o. 12/14/2015   Principle Diagnosis:  1. Pernicious anemia - anti-intrinsic factor antibodies.  2. Intermittent iron-deficiency anemia.  4. 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   Current Therapy:   1. Vitamin B12, 1 mg IM q.4 months.  2. IV iron as indicated.    Interim History: [name] is here for a follow-up today. She has had 2 falls since we last saw her. She states that her left leg gets stiff or goes out on her. Thankfully she has not been seriously injured. She has an appointment with Dr. Anne HahnWillis with Neurology in January for further evaluation.  In October, her B12 was 376, ferritin was 429 and iron saturation was 5%. She received 2 iron infusions that month.  She is having some mild fatigue at times.  No fever, chills, n/v, cough, rash, headache, dizziness, SOB, chest pain, palpitations, abdominal pain, change in bowel or bladder habits, blood in urine or stool.  No swelling, tenderness or numbness in her extremities. The intermittent tingling in her fingertips is unchanged.  She has a good appetite and is staying well hydrated. Her weight is down 9 lbs since her last visit.  Her daughter is currently living with her with her 2 children. She is expecting another baby and will be getting married in March.  Medications:    Medication List       This list is accurate as of: 12/14/15 10:47 AM.  Always use your most recent med list.               albuterol 108 (90 BASE) MCG/ACT inhaler  Commonly known as:  PROVENTIL HFA;VENTOLIN HFA  Inhale 1 puff into the lungs every 6 (six) hours as needed. PRO AIR     baclofen 20 MG tablet  Commonly known as:  LIORESAL  20 mg 3 (three) times daily.     cholecalciferol 1000 UNITS tablet  Commonly known as:  VITAMIN D  Take 1,000 Units by mouth daily.     cyanocobalamin 1000 MCG/ML injection  Commonly known as:  (VITAMIN B-12)    Inject 1,000 mcg into the muscle every 3 (three) months.     cyclobenzaprine 10 MG tablet  Commonly known as:  FLEXERIL  Take 10 mg by mouth at bedtime.     desloratadine 5 MG tablet  Commonly known as:  CLARINEX  Take 1 tablet (5 mg total) by mouth daily.     diazepam 10 MG tablet  Commonly known as:  VALIUM  TAKE TWO TABLETS BY MOUTH THREE TIMES DAILY     diclofenac sodium 1 % Gel  Commonly known as:  VOLTAREN  Apply 2 g topically 4 (four) times daily.     diphenhydrAMINE 25 MG tablet  Commonly known as:  BENADRYL  Take 25 mg by mouth at bedtime as needed.     EPIPEN 2-PAK 0.3 mg/0.3 mL Soaj injection  Generic drug:  EPINEPHrine  USE AS DIRECTED FOR  SEVERE  ALLERGIC  REACTIONS     famotidine 20 MG tablet  Commonly known as:  PEPCID  Take 20 mg by mouth daily.     Fluticasone-Salmeterol 250-50 MCG/DOSE Aepb  Commonly known as:  ADVAIR  Inhale 1 puff into the lungs 2 (two) times daily.     gabapentin 300 MG capsule  Commonly known as:  NEURONTIN  Take 300 mg by mouth 3 (three) times daily.  levocetirizine 5 MG tablet  Commonly known as:  XYZAL  Take 1 tablet (5 mg total) by mouth every evening.     loratadine 10 MG tablet  Commonly known as:  CLARITIN  Take 10 mg by mouth 2 (two) times daily.     meloxicam 7.5 MG tablet  Commonly known as:  MOBIC  Take 15 mg by mouth daily.     montelukast 10 MG tablet  Commonly known as:  SINGULAIR  Take by mouth.     omeprazole 40 MG capsule  Commonly known as:  PRILOSEC  Take 40 mg by mouth daily.     oxyCODONE 5 MG immediate release tablet  Commonly known as:  Oxy IR/ROXICODONE  TAKE 1 TO 2 TABLETS EVERY DAY AS NEEDED FOR PAIN     predniSONE 20 MG tablet  Commonly known as:  DELTASONE  Take 3 tabs po on first day, 2 tabs second day, 2 tabs third day, 1 tab fourth day, 1 tab 5th day. Take with food.     traMADol 50 MG tablet  Commonly known as:  ULTRAM  Take 50 mg by mouth every 6 (six) hours as needed.         Allergies:  Allergies  Allergen Reactions  . Ibuprofen Other (See Comments)    Does not take due to hx of renal insufficiency  . Tylenol [Acetaminophen] Hives    Cannot take large quantities  . Amoxicillin Diarrhea    Past Medical History, Surgical history, Social history, and Family History were reviewed and updated.  Review of Systems: All other 10 point review of systems is negative.   Physical Exam:  vitals were not taken for this visit.  Wt Readings from Last 3 Encounters:  10/11/15 151 lb (68.493 kg)  07/12/15 160 lb 8 oz (72.802 kg)  03/10/15 157 lb (71.215 kg)    Ocular: Sclerae unicteric, pupils equal, round and reactive to light Ear-nose-throat: Oropharynx clear, dentition fair Lymphatic: No cervical supraclavicular or axillary adenopathy Lungs no rales or rhonchi, good excursion bilaterally Heart regular rate and rhythm, no murmur appreciated Abd soft, nontender, positive bowel sounds MSK no focal spinal tenderness, no joint edema Neuro: non-focal, well-oriented, appropriate affect Breasts: Deferred  Lab Results  Component Value Date   WBC 5.1 12/14/2015   HGB 11.4* 12/14/2015   HCT 35.9 12/14/2015   MCV 88 12/14/2015   PLT 284 12/14/2015   Lab Results  Component Value Date   FERRITIN 429* 10/11/2015   IRON 11* 10/11/2015   TIBC 210* 10/11/2015   UIBC 199 10/11/2015   IRONPCTSAT 5* 10/11/2015   Lab Results  Component Value Date   RETICCTPCT 1.9 06/02/2014   RBC 4.09 12/14/2015   RETICCTABS 79.0 06/02/2014   Lab Results  Component Value Date   KPAFRELGTCHN 1.06 08/02/2011   LAMBDASER 1.49 08/02/2011   KAPLAMBRATIO 0.71 08/02/2011   Lab Results  Component Value Date   IGGSERUM 1180 08/02/2011   IGA 206 08/02/2011   IGMSERUM 198 08/02/2011   Lab Results  Component Value Date   TOTALPROTELP 6.6 08/02/2011   ALBUMINELP 57.1 08/02/2011   A1GS 4.1 08/02/2011   A2GS 9.6 08/02/2011   BETS 6.1 08/02/2011   BETA2SER 5.9 08/02/2011    GAMS 17.2 08/02/2011   MSPIKE NOT DET 08/02/2011   SPEI * 08/02/2011     Chemistry      Component Value Date/Time   NA 139 10/11/2015 1051   NA 145 06/09/2015 1022   NA 146* 03/10/2015 1021  K 3.1* 10/11/2015 1051   K 4.3 06/09/2015 1022   K 3.8 03/10/2015 1021   CL 107 06/09/2015 1022   CL 106 03/10/2015 1021   CO2 27 10/11/2015 1051   CO2 25 06/09/2015 1022   CO2 28 03/10/2015 1021   BUN 9.9 10/11/2015 1051   BUN 11 06/09/2015 1022   BUN 14 03/10/2015 1021   CREATININE 1.1 10/11/2015 1051   CREATININE 0.96 06/09/2015 1022   CREATININE 0.9 03/10/2015 1021      Component Value Date/Time   CALCIUM 9.7 10/11/2015 1051   CALCIUM 9.8 06/09/2015 1022   CALCIUM 9.1 03/10/2015 1021   ALKPHOS 85 10/11/2015 1051   ALKPHOS 91 06/09/2015 1022   ALKPHOS 94* 03/10/2015 1021   AST 31 10/11/2015 1051   AST 14 06/09/2015 1022   AST 20 03/10/2015 1021   ALT 14 10/11/2015 1051   ALT 9 06/09/2015 1022   ALT 14 03/10/2015 1021   BILITOT 0.44 10/11/2015 1051   BILITOT 0.3 06/09/2015 1022   BILITOT 0.40 03/10/2015 1021     Impression and Plan: Sherri [name] is 49 year old African American female with pernicious anemia and intermittent iron deficiency anemia. She received 2 doses of Feraheme in October and is feeling better. She has some mild fatigue at times.  We will go ahead and give her a B12 injection.  Unfortunately she has had 2 falls recently due to her left leg going stiff and will be seeing Dr. Anne Hahn with Neurology in January. We will plan to see her back in 4 months for labs and follow-up.  She is knows to call here with any questions or concerns. We can certainly see her sooner if need be.   Verdie Mosher, NP 12/20/201610:47 AM

## 2015-12-15 ENCOUNTER — Telehealth: Payer: Self-pay | Admitting: Emergency Medicine

## 2015-12-15 ENCOUNTER — Telehealth: Payer: Self-pay

## 2015-12-15 LAB — RETICULOCYTES
ABS Retic: 71.8 10*3/uL (ref 19.0–186.0)
RBC.: 3.99 MIL/uL (ref 3.87–5.11)
Retic Ct Pct: 1.8 % (ref 0.4–2.3)

## 2015-12-15 LAB — VITAMIN D 25 HYDROXY (VIT D DEFICIENCY, FRACTURES): Vit D, 25-Hydroxy: 23 ng/mL — ABNORMAL LOW (ref 30–100)

## 2015-12-15 LAB — VITAMIN B12: Vitamin B-12: 684 pg/mL (ref 211–911)

## 2015-12-15 NOTE — Telephone Encounter (Addendum)
-----   Message from Josph MachoPeter R Ennever, MD sent at 12/15/2015  9:48 AM EST ----- Call - vit D is very low!!  Is she taking vit D???  [name]  Message left on VM to contact our office. dph

## 2015-12-15 NOTE — Telephone Encounter (Signed)
Patient states not taking Vitamin D for the past week. States she was taking 1000 units (5 tablets or 5000 units) BID. States Dr Allyne GeeSanders office was prescribing this previously.

## 2016-01-07 DIAGNOSIS — N182 Chronic kidney disease, stage 2 (mild): Secondary | ICD-10-CM | POA: Diagnosis not present

## 2016-01-07 DIAGNOSIS — I129 Hypertensive chronic kidney disease with stage 1 through stage 4 chronic kidney disease, or unspecified chronic kidney disease: Secondary | ICD-10-CM | POA: Diagnosis not present

## 2016-01-07 DIAGNOSIS — M545 Low back pain: Secondary | ICD-10-CM | POA: Diagnosis not present

## 2016-01-12 ENCOUNTER — Telehealth: Payer: Self-pay

## 2016-01-12 ENCOUNTER — Ambulatory Visit: Payer: Medicare Other | Admitting: Neurology

## 2016-01-12 NOTE — Telephone Encounter (Signed)
Patient canceled appointment the day of appointment.  

## 2016-01-13 ENCOUNTER — Encounter: Payer: Self-pay | Admitting: Neurology

## 2016-01-17 ENCOUNTER — Telehealth: Payer: Self-pay | Admitting: Neurology

## 2016-01-17 NOTE — Telephone Encounter (Signed)
Pt called to check on next scheduled appt. I asked if she was doing ok to wait until May and she said for about the past month her left foot/leg has been dragging. She said she cannot pick up leg, if she walks down steps the leg won't bend. She cannot step with left ft first. She was going to talk with Dr Anne Hahn on 1/18 but she missed that appt.

## 2016-01-17 NOTE — Telephone Encounter (Signed)
I called the patient. Over the last month or so she has fallen a few times because her left leg drags. She denies any numbness/tingling in her left leg. She is coming into the office tomorrow to see Dr. Anne Hahn.

## 2016-01-18 ENCOUNTER — Encounter: Payer: Self-pay | Admitting: Neurology

## 2016-01-18 ENCOUNTER — Encounter (INDEPENDENT_AMBULATORY_CARE_PROVIDER_SITE_OTHER): Payer: Self-pay

## 2016-01-18 ENCOUNTER — Ambulatory Visit (INDEPENDENT_AMBULATORY_CARE_PROVIDER_SITE_OTHER): Payer: Medicare Other | Admitting: Neurology

## 2016-01-18 VITALS — BP 114/77 | HR 90 | Ht 63.0 in | Wt 159.0 lb

## 2016-01-18 DIAGNOSIS — M549 Dorsalgia, unspecified: Secondary | ICD-10-CM

## 2016-01-18 DIAGNOSIS — G894 Chronic pain syndrome: Secondary | ICD-10-CM | POA: Diagnosis not present

## 2016-01-18 DIAGNOSIS — G2582 Stiff-man syndrome: Secondary | ICD-10-CM | POA: Diagnosis not present

## 2016-01-18 DIAGNOSIS — G8929 Other chronic pain: Secondary | ICD-10-CM

## 2016-01-18 DIAGNOSIS — Z5181 Encounter for therapeutic drug level monitoring: Secondary | ICD-10-CM | POA: Diagnosis not present

## 2016-01-18 DIAGNOSIS — R251 Tremor, unspecified: Secondary | ICD-10-CM

## 2016-01-18 MED ORDER — BACLOFEN 20 MG PO TABS: ORAL_TABLET | ORAL | Status: DC

## 2016-01-18 NOTE — Patient Instructions (Signed)

## 2016-01-18 NOTE — Progress Notes (Signed)
Reason for visit: Stiff person syndrome  Sherri [name] is an 50 y.o. female  History of present illness:  Sherri [name] is a 50 year old right-handed black female with a history of stiff person's syndrome, and a history of rhabdomyolysis in the past. The patient does have some chronic issues with back pain, she has some evidence of disc disease at L4-5 and L5-S1 levels, primarily off to the right. The patient indicates that beginning in the third week of December 2016, she began having some increased stiffness of the left leg, she has fallen on 2 occasions. The patient denies any pain down the leg, but she has difficulty with dragging the leg. She denies any numbness of the extremities. She does have some back pain, but this has not increased recently. She is on baclofen, diazepam, Flexeril, and gabapentin. The patient denies any issues controlling the bowels or the bladder. She denies any neck or arm discomfort, weakness, or stiffness. She has had no changes in function of the right leg. She comes to this office for an evaluation.  Past Medical History  Diagnosis Date  . Pernicious anemia 10/30/2011  . Lumbar herniated disc   . Environmental allergies   . Angioedema   . DVT (deep venous thrombosis) (HCC)   . Asthma   . Seasonal allergies   . Urticaria   . Chronic back pain   . Anxiety   . Depression   . GERD (gastroesophageal reflux disease)   . Arthritis   . Chronic kidney disease   . Peripheral vascular disease (HCC)   . CAP (community acquired pneumonia) 08/29/2012  . Stiff person syndrome   . Hiatal hernia 03/21/2013  . Cocaine abuse   . Postconcussion syndrome 12/30/2014  . Fall     Past Surgical History  Procedure Laterality Date  . Inguinal hernia 1983  1983  . Colonoscopy  07/09/15    Family History  Problem Relation Age of Onset  . Cancer Mother   . Other Mother   . COPD Father   . Asthma Brother   . Cancer Brother     colon  . Heart attack Brother   .  Seizures Brother     Social history:  reports that she has never smoked. She has never used smokeless tobacco. She reports that she does not drink alcohol or use illicit drugs.    Allergies  Allergen Reactions  . Ibuprofen Other (See Comments)    Does not take due to hx of renal insufficiency  . Tylenol [Acetaminophen] Hives    Cannot take large quantities  . Amoxicillin Diarrhea    Medications:  Prior to Admission medications   Medication Sig Start Date End Date Taking? Authorizing Provider  albuterol (PROVENTIL HFA;VENTOLIN HFA) 108 (90 BASE) MCG/ACT inhaler Inhale 1 puff into the lungs every 6 (six) hours as needed. PRO AIR 06/20/13  Yes Waymon Budge, MD  baclofen (LIORESAL) 20 MG tablet 20 mg 3 (three) times daily.  10/19/12  Yes Historical Provider, MD  cholecalciferol (VITAMIN D) 1000 UNITS tablet Take 1,000 Units by mouth daily.   Yes Historical Provider, MD  cyanocobalamin (,VITAMIN B-12,) 1000 MCG/ML injection Inject 1,000 mcg into the muscle every 3 (three) months.    Yes Historical Provider, MD  cyclobenzaprine (FLEXERIL) 10 MG tablet Take 10 mg by mouth at bedtime.  10/21/12  Yes Historical Provider, MD  desloratadine (CLARINEX) 5 MG tablet Take 1 tablet (5 mg total) by mouth daily. 11/06/11  Yes Clinton D Young,  MD  diazepam (VALIUM) 10 MG tablet TAKE TWO TABLETS BY MOUTH THREE TIMES DAILY 10/19/15  Yes York Spaniel, MD  diclofenac sodium (VOLTAREN) 1 % GEL Apply 2 g topically 4 (four) times daily. 10/02/14  Yes Ozella Rocks, MD  diphenhydrAMINE (BENADRYL) 25 MG tablet Take 25 mg by mouth at bedtime as needed.   Yes Historical Provider, MD  EPIPEN 2-PAK 0.3 MG/0.3ML SOAJ injection USE AS DIRECTED FOR  SEVERE  ALLERGIC  REACTIONS 08/16/15  Yes Waymon Budge, MD  famotidine (PEPCID) 20 MG tablet Take 20 mg by mouth daily.   Yes Historical Provider, MD  Fluticasone-Salmeterol (ADVAIR) 250-50 MCG/DOSE AEPB Inhale 1 puff into the lungs 2 (two) times daily.  03/14/12  Yes  Waymon Budge, MD  gabapentin (NEURONTIN) 300 MG capsule Take 300 mg by mouth 3 (three) times daily.    Yes Historical Provider, MD  levocetirizine (XYZAL) 5 MG tablet Take 1 tablet (5 mg total) by mouth every evening. 11/06/11  Yes Waymon Budge, MD  loratadine (CLARITIN) 10 MG tablet Take 10 mg by mouth 2 (two) times daily.   Yes Historical Provider, MD  montelukast (SINGULAIR) 10 MG tablet Take by mouth.    Yes Historical Provider, MD  omeprazole (PRILOSEC) 40 MG capsule Take 40 mg by mouth daily.   Yes Historical Provider, MD    ROS:  Out of a complete 14 system review of symptoms, the patient complains only of the following symptoms, and all other reviewed systems are negative.  Left leg stiffness Gait disorder  Blood pressure 114/77, pulse 90, height  (1.6 m), weight 159 lb (72.122 kg).  Physical Exam  General: The patient is alert and cooperative at the time of the examination.  Skin: No significant peripheral edema is noted.   Neurologic Exam  Mental status: The patient is alert and oriented x 3 at the time of the examination. The patient has apparent normal recent and remote memory, with an apparently normal attention span and concentration ability.   Cranial nerves: Facial symmetry is present. Speech is normal, no aphasia or dysarthria is noted. Extraocular movements are full. Visual fields are full.  Motor: The patient has good strength in all 4 extremities.  Sensory examination: Soft touch sensation is symmetric on the face, arms, and legs.  Coordination: The patient has good finger-nose-finger and heel-to-shin bilaterally.  Gait and station: The patient has a relatively normal gait at times, she at times walk with the left leg stiff. Tandem gait was not performed, the patient indicated that she could not perform this maneuver. Romberg is negative. No drift is seen.  Reflexes: Deep tendon reflexes are symmetric.   Assessment/Plan:  1. Stiff person  syndrome  2. Chronic low back pain  3. Left leg stiffness  The patient reports new issues with left leg stiffness. There is some variability of function on the clinical examination today. The patient initially was able to walk fairly well, but when asked to perform tandem gait, all of a sudden her left leg was stiff and she could not move it. I am suspect that some of her deficits may be functional in nature. The patient will be increased on the baclofen taking 1.5 tablets of the 20 mg tablets in the morning and evening, one tablet at midday. She will set up for nerve conduction studies of both legs, EMG of the left leg. She will have blood work today to include a urine drug screen, the patient has signed  a controlled substance agreement. In the past, she has had cocaine in her urine. She will follow-up for the EMG evaluation.   Marlan Palau MD 01/18/2016 10:03 AM  [facility] [address] [place], [place] [address]  Phone [phone] Fax [phone]

## 2016-01-19 DIAGNOSIS — N182 Chronic kidney disease, stage 2 (mild): Secondary | ICD-10-CM | POA: Diagnosis not present

## 2016-01-19 DIAGNOSIS — M545 Low back pain: Secondary | ICD-10-CM | POA: Diagnosis not present

## 2016-01-19 DIAGNOSIS — I129 Hypertensive chronic kidney disease with stage 1 through stage 4 chronic kidney disease, or unspecified chronic kidney disease: Secondary | ICD-10-CM | POA: Diagnosis not present

## 2016-01-19 LAB — COMPREHENSIVE METABOLIC PANEL
ALT: 14 IU/L (ref 0–32)
AST: 12 IU/L (ref 0–40)
Albumin/Globulin Ratio: 1.4 (ref 1.1–2.5)
Albumin: 4 g/dL (ref 3.5–5.5)
Alkaline Phosphatase: 98 IU/L (ref 39–117)
BUN/Creatinine Ratio: 14 (ref 9–23)
BUN: 14 mg/dL (ref 6–24)
CHLORIDE: 104 mmol/L (ref 96–106)
CO2: 27 mmol/L (ref 18–29)
Calcium: 9.5 mg/dL (ref 8.7–10.2)
Creatinine, Ser: 0.97 mg/dL (ref 0.57–1.00)
GFR calc non Af Amer: 68 mL/min/{1.73_m2} (ref >59)
GFR, EST AFRICAN AMERICAN: 78 mL/min/{1.73_m2} (ref >59)
GLUCOSE: 101 mg/dL — AB (ref 65–99)
Globulin, Total: 2.8 g/dL (ref 1.5–4.5)
Potassium: 4.6 mmol/L (ref 3.5–5.2)
Sodium: 145 mmol/L — ABNORMAL HIGH (ref 134–144)
Total Protein: 6.8 g/dL (ref 6.0–8.5)

## 2016-01-19 LAB — CK: Total CK: 59 U/L (ref 24–173)

## 2016-01-25 ENCOUNTER — Telehealth: Payer: Self-pay | Admitting: Neurology

## 2016-01-25 LAB — COMPLIANCE DRUG ANALYSIS, UR: PDF: 0

## 2016-01-25 NOTE — Telephone Encounter (Signed)
I called patient. The urine drug screen is positive for cocaine. The diazepam cannot be prescribed through this office in the future. I have indicated that she is to go to one half of a 10 mg tablet 3 times a day for 3 weeks, then go to one half tablet twice a day for 2 weeks, then go to one half tablet once a day for 2 weeks, then stop the medication. She is not to stop the medication suddenly. We can continue to give her baclofen.

## 2016-01-26 ENCOUNTER — Ambulatory Visit (INDEPENDENT_AMBULATORY_CARE_PROVIDER_SITE_OTHER): Payer: Medicare Other | Admitting: Neurology

## 2016-01-26 ENCOUNTER — Ambulatory Visit (INDEPENDENT_AMBULATORY_CARE_PROVIDER_SITE_OTHER): Payer: Self-pay | Admitting: Neurology

## 2016-01-26 ENCOUNTER — Encounter: Payer: Self-pay | Admitting: Neurology

## 2016-01-26 DIAGNOSIS — M549 Dorsalgia, unspecified: Secondary | ICD-10-CM

## 2016-01-26 DIAGNOSIS — G2582 Stiff-man syndrome: Secondary | ICD-10-CM

## 2016-01-26 DIAGNOSIS — G8929 Other chronic pain: Secondary | ICD-10-CM

## 2016-01-26 MED ORDER — GABAPENTIN 300 MG PO CAPS: ORAL_CAPSULE | ORAL | Status: DC

## 2016-01-26 NOTE — Progress Notes (Signed)
Please refer to EMG and nerve conduction study procedure note. 

## 2016-01-26 NOTE — Procedures (Signed)
     HISTORY:  Sherri [name] is a 50 year old patient with a history of stiff person syndrome, and chronic low back pain. The patient recently has noted some increased back pain, left leg symptoms with stiffness of the leg. She is being evaluated for possible lumbosacral radiculopathy.  NERVE CONDUCTION STUDIES:  Nerve conduction studies were performed on both lower extremities. The distal motor latencies and motor amplitudes for the peroneal and posterior tibial nerves were within normal limits. The nerve conduction velocities for these nerves were also normal. The H reflex latencies were normal. The sensory latencies for the peroneal nerves were within normal limits.   EMG STUDIES:  EMG study was performed on the left lower extremity:  The tibialis anterior muscle reveals 2 to 4K motor units with full recruitment. No fibrillations or positive waves were seen. The peroneus tertius muscle reveals 2 to 4K motor units with full recruitment. No fibrillations or positive waves were seen. The medial gastrocnemius muscle reveals 1 to 3K motor units with full recruitment. No fibrillations or positive waves were seen. The vastus lateralis muscle reveals 2 to 4K motor units with full recruitment. No fibrillations or positive waves were seen. The iliopsoas muscle reveals 2 to 4K motor units with full recruitment. No fibrillations or positive waves were seen. The biceps femoris muscle (long head) reveals 2 to 4K motor units with full recruitment. No fibrillations or positive waves were seen. The lumbosacral paraspinal muscles were tested at 3 levels, and revealed no abnormalities of insertional activity at all 3 levels tested. There was good relaxation.   IMPRESSION:  Nerve conduction studies done on both lower extremities were within normal limits. No evidence of a neuropathy is seen. EMG evaluation of the left lower extremity was unremarkable, without evidence of an overlying lumbosacral  radiculopathy.  Marlan Palau MD 01/26/2016 3:57 PM  [facility] [address] [place], [place] [address]  Phone [phone] Fax [phone]

## 2016-01-26 NOTE — Progress Notes (Signed)
The patient comes in today for EMG and nerve conduction study evaluation. She has had some back pain and left leg stiffness.  Nerve conduction studies of both legs were normal, EMG of the left leg was normal. The patient will be set up for physical therapy, gabapentin will be increased taking 300 mg twice during the day, and 600 mg at night. She was given a prescription for the electrode pads for her TENS unit, the TENS unit has been effective in the past.  The urine drug screen was positive for cocaine, the patient will need to taper off of her diazepam as we will not be prescribing this medication for her in the future. She can continue the baclofen.  She has a follow-up revisit in May 2017.

## 2016-01-31 NOTE — Telephone Encounter (Addendum)
Pt called inquiring what provider is going to prescribe in place of the diazepam. She said she "has not done anything to have "dirty" urine but she has been around others that have but she has not". Pt said she is not tapering off diazepam, she is quitting it, said she has not had any today. I relayed to pt she should do as provider directed. She said the home phone was in her daughter's room and her daughter heard the message and is questioning her if she is doing these drugs. She said due to this she is stopping the diazepam at once. Please call her.

## 2016-01-31 NOTE — Telephone Encounter (Signed)
I called the patient. She is very upset that the information in Dr. Anne Hahn' message was left in a voicemail on her home phone. For that reason, she states she will not taper off the Diazepam. I advised that she could go through withdrawals, which could include a seizure and other symptoms that could be life threatening or put her in the hospital. She stated she was warned but would still not taper off the medication. I spent a while trying to persuade her, but she continued to refuse. I advised I would let Dr. Anne Hahn know.

## 2016-02-02 ENCOUNTER — Ambulatory Visit: Payer: Medicare Other | Attending: Neurology | Admitting: Physical Therapy

## 2016-02-02 ENCOUNTER — Telehealth: Payer: Self-pay | Admitting: Neurology

## 2016-02-02 ENCOUNTER — Encounter: Payer: Self-pay | Admitting: Physical Therapy

## 2016-02-02 VITALS — BP 139/83 | HR 85

## 2016-02-02 DIAGNOSIS — R269 Unspecified abnormalities of gait and mobility: Secondary | ICD-10-CM | POA: Diagnosis not present

## 2016-02-02 DIAGNOSIS — R29898 Other symptoms and signs involving the musculoskeletal system: Secondary | ICD-10-CM

## 2016-02-02 DIAGNOSIS — R531 Weakness: Secondary | ICD-10-CM | POA: Insufficient documentation

## 2016-02-02 DIAGNOSIS — R2681 Unsteadiness on feet: Secondary | ICD-10-CM | POA: Diagnosis not present

## 2016-02-02 DIAGNOSIS — R258 Other abnormal involuntary movements: Secondary | ICD-10-CM | POA: Diagnosis not present

## 2016-02-02 NOTE — Patient Instructions (Signed)
KNEE: Extension, Long Arc Quads - Sitting    Raise leg until knee is straight. __10_ reps per set, __1_ sets per day, __5High Stepping in Place (Sitting)    Sitting, alternately lift knees as high as possible. Keep torso erect. Repeat __10__ times, each leg.  Copyright  VHI. All rights reserved.  _ days per week  Copyright  VHI. All rights reserved.

## 2016-02-02 NOTE — Therapy (Signed)
[facility] [address] [place], [place], [address] Phone: [phone]   Fax:  [phone]  Physical Therapy Evaluation  Patient Details  Name: Sherri [name] MRN: [id number] Date of Birth: 1966-05-14 Referring Provider: York Spaniel.  Encounter Date: 02/02/2016      PT End of Session - 02/02/16 1023    Visit Number 1   Number of Visits 17   Date for PT Re-Evaluation 04/02/16   Authorization Type Medicare   Authorization Time Period G codes every 10th visit   PT Start Time 0834   PT Stop Time 0935   PT Time Calculation (min) 61 min   Equipment Utilized During Treatment Gait belt   Activity Tolerance Patient tolerated treatment well   Behavior During Therapy WFL for tasks assessed/performed      Past Medical History  Diagnosis Date  . Pernicious anemia 10/30/2011  . Lumbar herniated disc   . Environmental allergies   . Angioedema   . DVT (deep venous thrombosis) (HCC)   . Asthma   . Seasonal allergies   . Urticaria   . Chronic back pain   . Anxiety   . Depression   . GERD (gastroesophageal reflux disease)   . Arthritis   . Chronic kidney disease   . Peripheral vascular disease (HCC)   . CAP (community acquired pneumonia) 08/29/2012  . Stiff person syndrome   . Hiatal hernia 03/21/2013  . Cocaine abuse   . Postconcussion syndrome 12/30/2014  . Fall     Past Surgical History  Procedure Laterality Date  . Inguinal hernia 1983  1983  . Colonoscopy  07/09/15    Filed Vitals:   02/02/16 0846  BP: 139/83  Pulse: 85    Visit Diagnosis:  Abnormality of gait  Unsteadiness  General weakness  Bradykinesia  Rigidity      Subjective Assessment - 02/02/16 0852    Subjective Pt is a 50 y.o. 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 Pt describes "stiffness" in L leg that causes her to fall to the L and posterior direction. Describes difficulty  with stair navigation, has fallen at least twice navigating stairs. Pt reports she has herniated discs in low back. Pt has history of anxiety disorder and panic attacks. Describes stress as trigger for "back spasms."   Pertinent History Stiff person syndrome, pernicious anemia   Patient Stated Goals I'd like to get rid of my cane and increase my strength.   Currently in Pain? Yes   Pain Location Leg   Pain Orientation Left   Pain Descriptors / Indicators Other (Comment)  stiffness   Pain Type Acute pain   Pain Onset 1 to 4 weeks ago   Pain Frequency Intermittent            [facility] PT Assessment - 02/02/16 0001    Assessment   Medical Diagnosis Stiff Person Syndrome, Chronic Back Pain   Referring Provider York Spaniel.   Onset Date/Surgical Date 03/11/14   Prior Therapy Receiving PT in past for general strengthening/ROM   Precautions   Precautions Fall   Balance Screen   Has the patient fallen in the past 6 months Yes   How many times? 2   Has the patient had a decrease in activity level because of a fear of falling?  Yes   Is the patient reluctant to leave their home because of a fear of falling?  Yes  Home Environment   Living Environment Private residence   Living Arrangements Children   Available Help at Discharge Family   Type of Home Apartment   Home Access Stairs to enter   Entrance Stairs-Number of Steps 2   Home Layout One level   Home Equipment [place] - single point;Cane - quad;Walker - 2 wheels  2 step ladder for bath tub entry   Prior Function   Level of Independence Independent with household mobility without device;Independent with basic ADLs   Observation/Other Assessments   Observations Diaphoresis noted throughout session. L patella crepitus with knee extension, bradykinesia with movements   Sensation   Light Touch Appears Intact   Tone   Assessment Location Left Lower Extremity  L quadriceps   ROM / Strength   AROM / PROM / Strength Strength;PROM    PROM   Overall PROM Comments Bilateral LEs WFL. Crepitus with L knee ext   Strength   Overall Strength Deficits   Overall Strength Comments Bilateral LE strength grossly 4/5 throughout   Transfers   Transfers Sit to Stand;Stand to Sit;Stand Pivot Transfers   Sit to Stand 6: Modified independent (Device/Increase time)   Stand to Sit 6: Modified independent (Device/Increase time)   Stand Pivot Transfers 4: Min assist   Comments Pt requires min a with stand pivot transfer when transfering to R. Pt asks to grab PTs arm for assistance to steady herself. Pt demonstrates difficulty with stand to sit, increased time and multiple attempts to flex L knee. pt increases weight shift to R to unweight L knee to perform stand to sit.   Ambulation/Gait   Ambulation/Gait Yes   Ambulation/Gait Assistance 6: Modified independent (Device/Increase time);4: Min assist   Ambulation/Gait Assistance Details Mod I with RW, min A with SPC - pt holds PTs arm for increased somatosensory input for balance.   Ambulation Distance (Feet) 40 Feet   Assistive device Rolling walker;Straight cane;Other (Comment)  PT arm   Gait Pattern Step-through pattern;Decreased step length - left;Decreased step length - right;Decreased stance time - right;Decreased stance time - left;Decreased stride length;Decreased hip/knee flexion - right;Decreased hip/knee flexion - left   Ambulation Surface Level;Indoor;Outdoor;Paved   Gait Comments Pt demonstrates increased trunkal rigidity during gait, inc weight shift in med/lat direction, decreased hip/knee flexion   Standardized Balance Assessment   Standardized Balance Assessment Timed Up and Go Test;10 meter walk test;Five Times Sit to Stand   Five times sit to stand comments  54.14 sec with bilateral  UE use, maintained L knee flexion in standing   10 Meter Walk SPC = 0.82 ft/sec, RW = 0.89 ft/sec  SPC = 39.96 sec, RW 36.99 sec   Timed Up and Go Test   TUG Normal TUG   Normal TUG  (seconds) 48.14  RW   TUG Comments Pt unwilling to attempt using SPC, felt unsafe   LLE Tone   LLE Tone Mild             PT Education - 02/02/16 1008    Education provided Yes   Education Details Pt educated on importance of use of RW for safety in home and community, especially in mornings before medications have reached maximal effect. Pt given HEP for general LE strengthening, advised to reduce time spent in bed.   Person(s) Educated Patient   Methods Explanation;Verbal cues   Comprehension Verbalized understanding;Returned demonstration          PT Short Term Goals - 02/02/16 1025    PT SHORT TERM  GOAL #1   Title The patient will demonstrate initiation of HEP to maximize functional gains made in PT.   Baseline TARGET DATE = 03/01/16   Time 4   Period Weeks   Status New   PT SHORT TERM GOAL #2   Title The patient will improve TUG by at least 3 seconds to improve gait efficiency and safe household ambulation.   Baseline TUG: 48.14 sec   Time 4   Period Weeks   Status New   PT SHORT TERM GOAL #3   Title The patient will improve 10 m walk test using LRAD by at least .3 ft/sec in order to demonstrate improved gait efficiency and speed.   Baseline SPC = 0.82 ft/sec, RW = 0.89 ft/sec   Time 4   Period Weeks   Status New   PT SHORT TERM GOAL #4   Title The patient will improve 5x sit to stand test by at least 4 seconds in order to demonstrate improved immediate standing balance and improved LE strength.   Baseline 5x STS = 54.14 sec   Time 4   Period Weeks   Status New   PT SHORT TERM GOAL #5   Title The patient will ambulate 150' outdoors over level paved surfaces mod I with LRAD to improve community ambulation   Baseline The patient currently ambulates outdoors with SPC and min A   Time 4   Period Weeks   Status New   Additional Short Term Goals   Additional Short Term Goals Yes   PT SHORT TERM GOAL #6   Title The patient will demonstrate ability to negotiate 1  6" step using UE assist/AD as needed in order to decrease anxiety with movement and improve safety with stair negotiation.   Baseline Pt reports increased anxiety with stair negotiation and is currently unable/unwilling to negotiate stairs   Time 4   Period Weeks   Status New           PT Long Term Goals - 02/02/16 1036    PT LONG TERM GOAL #1   Title The patient will demonstrate indpendence with HEP to maximize functional gains made in PT.   Baseline TARGET DATE: 04/02/16   Time 8   Period Weeks   Status New   PT LONG TERM GOAL #2   Title The patient will complete TUG in less than or equal to 30 seconds in order to demonstrate improvements in functional mobility.   Baseline TUG = 48.14 sec   Time 8   Period Weeks   Status New   PT LONG TERM GOAL #3   Title The patient will improve 10 m walk test with LRAD by greater than or equal to 0.6 ft/sec to demonstrate decreased risk for recurrent falls and improved gait efficiency.   Baseline SPC = 0.82 ft/sec, RW = 0.89 ft/sec   Time 8   Period Weeks   Status New   PT LONG TERM GOAL #4   Title The patient will improve 5x sit to stand test score to less than or equal to 30 seconds to demonstrate improvements in speed of movement and LE strength.   Baseline 5x STS = 54.14 SEC   Time 8   Period Weeks   Status New   PT LONG TERM GOAL #5   Title The patient will demonstrate ability to ambulate 4 stairs with UE assist/AD as needed with mod I to demonstrate decreased apprehension, improved functional mobility, and independence with negotiating stairs/steps  at home.  Pt apprehensive/fearful of stairs at this time   Baseline Pt currently unable/unwilling to negotiate stairs   Time 8   Period Weeks   Status New            Plan - February 27, 2016 1009    Clinical Impression Statement The pt is a 50 y.o. F with history of Stiff Person Syndrome, pernicious anemia, chronic low back pain, anxiety, and panic disorder presenting today with complaints  of L lower leg pain and falls. Today's evaluation revealed global deficits in LE strength, increased tone/rigidity in L LE, bradykinesia with movements, abnormal gait, and decreased gait speed. These impairments are currently limiting the patients ability to negotiate stairs, safely ambulate around the home and community, and place the pt at risk for falls as evidenced by her performance on the TUG, 10 m walk test, and 5x sit to stand test. Skilled PT recommended at this time 2x/wk for 8 weeks in order to address the above impairments, improve efficiency of gait, and decrease the pt's risk for falls.  Pt had near panic attack when PT discussed stair training   Pt will benefit from skilled therapeutic intervention in order to improve on the following deficits Abnormal gait;Decreased activity tolerance;Decreased balance;Decreased endurance;Decreased knowledge of use of DME;Decreased mobility;Decreased strength;Difficulty walking;Increased muscle spasms;Impaired tone;Improper body mechanics;Postural dysfunction;Pain   Rehab Potential Fair  Will determine as therapy progresses if pt is compliant   Clinical Impairments Affecting Rehab Potential Chronic condition, decreased compliance with PT in past   PT Frequency 2x / week   PT Duration 8 weeks   PT Treatment/Interventions ADLs/Self Care Home Management;Canalith Repostioning;Biofeedback;Electrical Stimulation;Moist Heat;DME Instruction;Gait training;Stair training;Functional mobility training;Therapeutic activities;Therapeutic exercise;Balance training;Neuromuscular re-education;Patient/family education;Orthotic Fit/Training;Wheelchair mobility training;Manual techniques;Passive range of motion   PT Next Visit Plan BERG, general LE strengthening - HEP, gait training with LRAD   PT Home Exercise Plan Seated hip flexion (marching), seated LAQs   Consulted and Agree with Plan of Care Patient          G-Codes - 02/27/16 1701    Functional Assessment Tool  Used TUG = 48.14 sec with RW; gait velocity = .82 ft/sec with SPC, .89 ft/sec with RW, and 5X STS = 54.14 sec with BUE assist   Functional Limitation Mobility: Walking and moving around   Mobility: Walking and Moving Around Current Status [phone]) At least 60 percent but less than 80 percent impaired, limited or restricted   Mobility: Walking and Moving Around Goal Status [phone]) At least 40 percent but less than 60 percent impaired, limited or restricted       Problem List Patient Active Problem List   Diagnosis Date Noted  . Postconcussion syndrome 12/30/2014  . Dizziness 12/18/2014  . Tremor 03/11/2014  . 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 Hiatal hernia 03/21/2013  . Anemia, iron deficiency 01/29/2013  . Spasm of muscle 09/02/2012  . Benzodiazepine withdrawal (HCC) 08/30/2012  . CAP (community acquired pneumonia) 08/29/2012  . Hypernatremia 08/28/2012  . Metabolic acidosis 08/28/2012  . Leukocytosis 08/28/2012  . UTI (lower urinary tract infection) 08/28/2012  . Anemia 08/28/2012  . Acute back pain 08/27/2012  . Rhabdomyolysis 08/27/2012  . ARF (acute renal failure) (HCC) 08/27/2012  . Hypokalemia 08/27/2012  . Asthma 08/27/2012  . Chronic back pain   . Pernicious anemia 10/30/2011  . DEGENERATIVE DISC DISEASE 08/18/2010  . DEEP VEIN THROMBOSIS/PHLEBITIS 07/28/2010  . Asthma with bronchitis 07/28/2010  . URTICARIA 07/28/2010    Celene Squibb, SPT 02/27/16, 5:06 PM  [facility] [address] [place], [place], [address] Phone: [phone]   Fax:  [phone]  Name: Sherri [name] MRN: [id number] Date of Birth: 10/12/1966

## 2016-02-02 NOTE — Telephone Encounter (Signed)
Patient called requesting that a DPR form be mailed to her, patient advised, DPR form will need to be filled out and signed in person. Patient states Dr. Anne Hahn left medical information on her home answering machine and she is sure her daughter heard it, I verified daughter's name and advised that we currently have a DPR on file to speak with this daughter, noted under comments in demographics section, and made note in FYI section until patient can stop by the office to fill out/sign an updated DPR form.

## 2016-02-08 ENCOUNTER — Encounter: Payer: Self-pay | Admitting: Rehabilitative and Restorative Service Providers"

## 2016-02-08 ENCOUNTER — Ambulatory Visit: Payer: Medicare Other | Admitting: Rehabilitative and Restorative Service Providers"

## 2016-02-08 DIAGNOSIS — R29898 Other symptoms and signs involving the musculoskeletal system: Secondary | ICD-10-CM | POA: Diagnosis not present

## 2016-02-08 DIAGNOSIS — R2681 Unsteadiness on feet: Secondary | ICD-10-CM | POA: Diagnosis not present

## 2016-02-08 DIAGNOSIS — R531 Weakness: Secondary | ICD-10-CM

## 2016-02-08 DIAGNOSIS — R269 Unspecified abnormalities of gait and mobility: Secondary | ICD-10-CM

## 2016-02-08 DIAGNOSIS — R258 Other abnormal involuntary movements: Secondary | ICD-10-CM | POA: Diagnosis not present

## 2016-02-08 NOTE — Therapy (Signed)
[facility] [address] [place], [place], [address] Phone: [phone]   Fax:  [phone]  Physical Therapy Treatment  Patient Details  Name: Sherri [name] MRN: [id number] Date of Birth: [birth year] Referring Provider: Kathrynn Ducking.  Encounter Date: 02/08/2016      PT End of Session - 02/08/16 1435    Visit Number 2   Number of Visits 17   Date for PT Re-Evaluation 04/02/16   Authorization Type Medicare   Authorization Time Period G codes every 10th visit   PT Start Time 1231   PT Stop Time 1315   PT Time Calculation (min) 44 min   Equipment Utilized During Treatment Gait belt   Activity Tolerance Patient tolerated treatment well   Behavior During Therapy WFL for tasks assessed/performed      Past Medical History  Diagnosis Date  . Pernicious anemia 10/30/2011  . Lumbar herniated disc   . Environmental allergies   . Angioedema   . DVT (deep venous thrombosis) ([place])   . Asthma   . Seasonal allergies   . Urticaria   . Chronic back pain   . Anxiety   . Depression   . GERD (gastroesophageal reflux disease)   . Arthritis   . Chronic kidney disease   . Peripheral vascular disease ([place])   . CAP (community acquired pneumonia) 08/29/2012  . Stiff person syndrome   . Hiatal hernia 03/21/2013  . Cocaine abuse   . Postconcussion syndrome 12/30/2014  . Fall     Past Surgical History  Procedure Laterality Date  . Inguinal hernia 1983  1983  . Colonoscopy  07/09/15    There were no vitals filed for this visit.  Visit Diagnosis:  Abnormality of gait  General weakness  Unsteadiness      Subjective Assessment - 02/08/16 1237    Subjective Pt arrives today stating that she took her Baclofen at 8 am this morning which has helped her movements today. She states she is not supposed to drive while on this medication. Pt reports fall over the weekend as she was carrying soup back to bedroom.   Pertinent  History Stiff person syndrome, pernicious anemia   Patient Stated Goals I'd like to get rid of my cane and increase my strength.   Currently in Pain? No/denies           [facility] PT Assessment - 02/08/16 1240    Standardized Balance Assessment   Standardized Balance Assessment Timed Up and Go Test;Five Times Sit to Stand;10 meter walk test   Five times sit to stand comments  22.47 sec using SPC, maintained bilateral knee flexion in standing   10 Meter Walk SPC = 2.00 ft/sec   Timed Up and Go Test   TUG Normal TUG   Normal TUG (seconds) 12.68  SPC            [facility] Adult PT Treatment/Exercise - 02/08/16 1240    Ambulation/Gait   Ambulation/Gait Yes   Ambulation/Gait Assistance 6: Modified independent (Device/Increase time)   Ambulation Distance (Feet) 300 Feet   Assistive device Straight cane   Gait Pattern Step-through pattern;Decreased stance time - left;Decreased hip/knee flexion - left;Left circumduction;Lateral trunk lean to right;Lateral trunk lean to left;Wide base of support   Ambulation Surface Level;Indoor   Gait Comments Pt demonstrates increased trunkal rigidity during gait, inc weight shift in med/lat direction, decreased hip/knee flexion. SPC lowered two notches with positive results - decreased med/lat weight shifting.  Neuro Re-ed    Neuro Re-ed Details  Pt performed standing toe taps to colored dots with single UE support at countertop ant/post direction 2x15 each leg. Verbal cues for increased hip/knee flexion for increased foot clearance. Performed standing lateral foot taps to colored dots with bilateral UE support at countertop in med/lat direction 2x15 each leg. Verbal cues required for inc hip/knee flexion for foot clearance and cues for upright trunk posture. Pt performed seated hip flexion and trunk rotation while seated at edge of mat table 3x12 L LE to improve foot clearance during gait and improve pt's ability to enter into her bed. Pt required  verbal cues for redirection to task at hand throughout performance.            PT Education - 02/08/16 1433    Education provided Yes   Education Details Pt instructed to perform seated hip flexion with trunk rotation exercise on her ottoman with wall/surface behind her at home for safety. Pt advised to use RW at home during points where her medications are not at peak effect or if she misses dosage as she is at increased risk for falls.   Person(s) Educated Patient   Methods Explanation;Demonstration;Tactile cues;Verbal cues   Comprehension Verbalized understanding;Returned demonstration          PT Short Term Goals - 02/08/16 1436    PT SHORT TERM GOAL #1   Title The patient will demonstrate initiation of HEP to maximize functional gains made in PT.   Baseline TARGET DATE = 03/01/16   Time 4   Period Weeks   Status On-going   PT SHORT TERM GOAL #2   Title The patient will improve TUG by at least 3 seconds to improve gait efficiency and safe household ambulation.   Baseline TUG: 48.14 sec   Time 4   Period Weeks   Status Achieved   PT SHORT TERM GOAL #3   Title The patient will improve 10 m walk test using LRAD by at least .3 ft/sec in order to demonstrate improved gait efficiency and speed.   Baseline SPC = 0.82 ft/sec, RW = 0.89 ft/sec   Time 4   Period Weeks   Status Achieved   PT SHORT TERM GOAL #4   Title The patient will improve 5x sit to stand test by at least 4 seconds in order to demonstrate improved immediate standing balance and improved LE strength.   Baseline 5x STS = 54.14 sec   Time 4   Period Weeks   Status Achieved   PT SHORT TERM GOAL #5   Title The patient will ambulate 150' outdoors over level paved surfaces mod I with LRAD to improve community ambulation   Baseline The patient currently ambulates outdoors with SPC and min A   Time 4   Period Weeks   Status On-going   PT SHORT TERM GOAL #6   Title The patient will demonstrate ability to negotiate  1 6" step using UE assist/AD as needed in order to decrease anxiety with movement and improve safety with stair negotiation.   Baseline Pt reports increased anxiety with stair negotiation and is currently unable/unwilling to negotiate stairs   Time 4   Period Weeks   Status On-going           PT Long Term Goals - 02/08/16 1438    PT LONG TERM GOAL #1   Title The patient will demonstrate indpendence with HEP to maximize functional gains made in PT.   Baseline  TARGET DATE: 04/02/16   Time 8   Period Weeks   Status On-going   PT LONG TERM GOAL #2   Title The patient will complete TUG in less than or equal to 30 seconds in order to demonstrate improvements in functional mobility.   Baseline TUG = 48.14 sec   Time 8   Period Weeks   Status Achieved   PT LONG TERM GOAL #3   Title The patient will improve 10 m walk test with LRAD by greater than or equal to 0.6 ft/sec to demonstrate decreased risk for recurrent falls and improved gait efficiency.   Baseline SPC = 0.82 ft/sec, RW = 0.89 ft/sec   Time 8   Period Weeks   Status Achieved   PT LONG TERM GOAL #4   Title The patient will improve 5x sit to stand test score to less than or equal to 30 seconds to demonstrate improvements in speed of movement and LE strength.   Baseline 5x STS = 54.14 SEC   Time 8   Period Weeks   Status Achieved   PT LONG TERM GOAL #5   Title The patient will demonstrate ability to ambulate 4 stairs with UE assist/AD as needed with mod I to demonstrate decreased apprehension, improved functional mobility, and independence with negotiating stairs/steps at home.  Pt apprehensive/fearful of stairs at this time   Baseline Pt currently unable/unwilling to negotiate stairs   Time 8   Period Weeks   Status On-going            Plan - 02/08/16 1441    Clinical Impression Statement The patient demonstrates progress towards her goals as she has met 3/5 STGs and 3/5 LTGs, including improving her TUG score, 5x sit  to stand, and 10 m walk test to well above baseline measurements. There is variability in clinical presentation today which the pt attributes to pharmacologic interventions. The patient continues to demonstrate abnormal gait, including increased weight shifting R and L, decreased L hip and knee flexion, and L hip circumduction. The patient's SPC was modified during today's session for appropriate fit with positive improvements in gait. Continue per POC for HEP, gait training, and stair management.   Pt will benefit from skilled therapeutic intervention in order to improve on the following deficits Abnormal gait;Decreased activity tolerance;Decreased balance;Decreased endurance;Decreased knowledge of use of DME;Decreased mobility;Decreased strength;Difficulty walking;Increased muscle spasms;Impaired tone;Improper body mechanics;Postural dysfunction;Pain   Rehab Potential Fair  Will determine as therapy progresses if pt is compliant   Clinical Impairments Affecting Rehab Potential Chronic condition, decreased compliance with PT in past   PT Frequency 2x / week   PT Duration 8 weeks   PT Treatment/Interventions ADLs/Self Care Home Management;Canalith Repostioning;Biofeedback;Electrical Stimulation;Moist Heat;DME Instruction;Gait training;Stair training;Functional mobility training;Therapeutic activities;Therapeutic exercise;Balance training;Neuromuscular re-education;Patient/family education;Orthotic Fit/Training;Wheelchair mobility training;Manual techniques;Passive range of motion   PT Next Visit Plan HEP review/progression - LE strengthening, flexibility exercises for L LE, low back; gait training with LRAD   PT Home Exercise Plan Seated hip flexion (marching), seated LAQs   Consulted and Agree with Plan of Care Patient        Problem List Patient Active Problem List   Diagnosis Date Noted  . Postconcussion syndrome 12/30/2014  . Dizziness 12/18/2014  . Tremor 03/11/2014  . Stiff person  syndrome 03/11/2014  . Hiatal hernia 03/21/2013  . Anemia, iron deficiency 01/29/2013  . Spasm of muscle 09/02/2012  . Benzodiazepine withdrawal ([place]) 08/30/2012  . CAP (community acquired pneumonia) 08/29/2012  . Hypernatremia 08/28/2012  .  Metabolic acidosis 81/09/3158  . Leukocytosis 08/28/2012  . UTI (lower urinary tract infection) 08/28/2012  . Anemia 08/28/2012  . Acute back pain 08/27/2012  . Rhabdomyolysis 08/27/2012  . ARF (acute renal failure) ([place]) 08/27/2012  . Hypokalemia 08/27/2012  . Asthma 08/27/2012  . Chronic back pain   . Pernicious anemia 10/30/2011  . DEGENERATIVE DISC DISEASE 08/18/2010  . DEEP VEIN THROMBOSIS/PHLEBITIS 07/28/2010  . Asthma with bronchitis 07/28/2010  . URTICARIA 07/28/2010    De Nurse, SPT 02/08/2016, 3:05 PM  [facility] [address] [place], [place], [address] Phone: [phone]   Fax:  [phone]  Name: Sherri [name] MRN: [id number] Date of Birth: 1966-05-25

## 2016-02-11 ENCOUNTER — Telehealth: Payer: Self-pay | Admitting: Neurology

## 2016-02-11 ENCOUNTER — Telehealth: Payer: Self-pay | Admitting: Physical Therapy

## 2016-02-11 ENCOUNTER — Ambulatory Visit: Payer: Medicare Other | Admitting: Physical Therapy

## 2016-02-11 DIAGNOSIS — R269 Unspecified abnormalities of gait and mobility: Secondary | ICD-10-CM

## 2016-02-11 DIAGNOSIS — R29898 Other symptoms and signs involving the musculoskeletal system: Secondary | ICD-10-CM | POA: Diagnosis not present

## 2016-02-11 DIAGNOSIS — R258 Other abnormal involuntary movements: Secondary | ICD-10-CM | POA: Diagnosis not present

## 2016-02-11 DIAGNOSIS — R531 Weakness: Secondary | ICD-10-CM | POA: Diagnosis not present

## 2016-02-11 DIAGNOSIS — R2681 Unsteadiness on feet: Secondary | ICD-10-CM

## 2016-02-11 NOTE — Therapy (Signed)
[name] [address] [place], [place], [address] Phone: [phone]   Fax:  [phone]  Physical Therapy Treatment  Patient Details  Name: Sherri [name] MRN: [id number] Date of Birth: [birth year] Referring Provider: York Spaniel.  Encounter Date: 02/11/2016      PT End of Session - 02/11/16 1903    Visit Number 3   Number of Visits 17   Date for PT Re-Evaluation 04/02/16   Authorization Type Medicare primary; [employer] Federal secondary   Authorization Time Period G codes every 10th visit   PT Start Time 1533   PT Stop Time 1616   PT Time Calculation (min) 43 min   Equipment Utilized During Treatment Gait belt   Activity Tolerance Patient tolerated treatment well   Behavior During Therapy WFL for tasks assessed/performed      Past Medical History  Diagnosis Date  . Pernicious anemia 10/30/2011  . Lumbar herniated disc   . Environmental allergies   . Angioedema   . DVT (deep venous thrombosis) ([facility])   . Asthma   . Seasonal allergies   . Urticaria   . Chronic back pain   . Anxiety   . Depression   . GERD (gastroesophageal reflux disease)   . Arthritis   . Chronic kidney disease   . Peripheral vascular disease ([facility])   . CAP (community acquired pneumonia) 08/29/2012  . Stiff person syndrome   . Hiatal hernia 03/21/2013  . Cocaine abuse   . Postconcussion syndrome 12/30/2014  . Fall     Past Surgical History  Procedure Laterality Date  . Inguinal hernia 1983  1983  . Colonoscopy  07/09/15    There were no vitals filed for this visit.  Visit Diagnosis:  Abnormality of gait  Unsteadiness  Rigidity      Subjective Assessment - 02/11/16 1536    Subjective Pt arrives today with reports of fall this morning. Says her left foot drug as she was going down slight incline while she was taking out the trash using her cane and fell flat on her face. Able to get herself up and get back into house,  retrieved her walker. Says she was very sore following last session, placed two lidoderm patches on her legs to ease pain. Pt reports inability to use RW in home due to narrow walkways and clutter. States her daughter didn't come home last night and she did not eat breakfast this morning.   Pertinent History Stiff person syndrome, pernicious anemia   Patient Stated Goals I'd like to get rid of my cane and increase my strength.   Currently in Pain? Yes   Pain Score 7    Pain Location Back   Pain Orientation Left   Pain Descriptors / Indicators Sore   Pain Type Chronic pain   Pain Onset 1 to 4 weeks ago                   [facility] Adult PT Treatment/Exercise - 02/11/16 1546    Ambulation/Gait   Ambulation/Gait Yes   Ambulation/Gait Assistance 6: Modified independent (Device/Increase time);5: Supervision;4: Min guard   Ambulation/Gait Assistance Details Gait x60' using RW, mod I. Using B forearm crutches, pt initially required (S) but quickly progressed to mod I over level, indoor surfaces x150'. Gait x80' with B forearm crutches requiring (S) over unlevel, paved surfaces and min guard over unlevel, grass surfaces.   Ambulation Distance (Feet) 290 Feet  total   Assistive device R Forearm  Crutch;L Forearm Crutch;Rolling walker   Gait Pattern Step-through pattern;Decreased stance time - left;Decreased hip/knee flexion - left;Left circumduction;Lateral trunk lean to right;Lateral trunk lean to left;Wide base of support   Ambulation Surface Level;Unlevel;Indoor;Outdoor;Paved;Grass   Curb 5: Supervision;4: Min assist   Curb Details (indicate cue type and reason) x2 consecutive trials. Initial trial on 1/2 curb step requiring min guard and mod cueing for technique, sequencing with B forearm crutches, Subsequent trial on standard curb step with (S), min cueing for sequencing.   Gait Comments Noted increased trunk rotation, improved obstacle negotiation, and increased stability with turning (both  90-degree and 180-degree turns) when using B forearm crutches as compared with SPC and RW.   Self-Care   Self-Care Other Self-Care Comments   Other Self-Care Comments  Therapist and pt engaged in discussion of safety in home environment, barriers to pt adherence to PT recommendation to use RW at all times for safety. Therapist strongly advised against pt carrying bags of trash due to safety concerns. Suggested asking daughter (with whom pt lives) for help with this. Discussed use of B forearm crutches to maximize gait stability while enabling pt to traverse home environment. See Pt Education for additional education provided. Pt verbalized understanding of recommendations and was in full agreement with recommendation of forearm crutches.   Neuro Re-ed    Neuro Re-ed Details  Performed floor transfer (seated on mat table <> tall kneeling with BUE support at mat table <> half kneeling <> quadruped <> prone). Half kneeling emphasized LE dissociation required for more normalized gait pattern; WB through flexed knees performed to address rigid knee extension limiting gait stability. Pt required min A to transition from seated > prone on floor and min guard to transfer from prone on floor > seated on mat table. Cueing/assist required for joint protection at L ankle during transitional movements.                PT Education - 02/11/16 1540    Education provided Yes   Education Details Recommending B forearm crutches to increase safety with mobility.  In the interim, pt advised to utilize RW at all times, indoors and outdoors, to decrease risk of fall. See Self Care for additonal education provided.     Person(s) Educated Patient   Methods Explanation   Comprehension Verbalized understanding          PT Short Term Goals - 02/08/16 1436    PT SHORT TERM GOAL #1   Title The patient will demonstrate initiation of HEP to maximize functional gains made in PT.   Baseline TARGET DATE = 03/01/16   Time  4   Period Weeks   Status On-going   PT SHORT TERM GOAL #2   Title The patient will improve TUG by at least 3 seconds to improve gait efficiency and safe household ambulation.   Baseline TUG: 48.14 sec   Time 4   Period Weeks   Status Achieved   PT SHORT TERM GOAL #3   Title The patient will improve 10 m walk test using LRAD by at least .3 ft/sec in order to demonstrate improved gait efficiency and speed.   Baseline SPC = 0.82 ft/sec, RW = 0.89 ft/sec   Time 4   Period Weeks   Status Achieved   PT SHORT TERM GOAL #4   Title The patient will improve 5x sit to stand test by at least 4 seconds in order to demonstrate improved immediate standing balance and improved LE strength.  Baseline 5x STS = 54.14 sec   Time 4   Period Weeks   Status Achieved   PT SHORT TERM GOAL #5   Title The patient will ambulate 150' outdoors over level paved surfaces mod I with LRAD to improve community ambulation   Baseline The patient currently ambulates outdoors with SPC and min A   Time 4   Period Weeks   Status On-going   PT SHORT TERM GOAL #6   Title The patient will demonstrate ability to negotiate 1 6" step using UE assist/AD as needed in order to decrease anxiety with movement and improve safety with stair negotiation.   Baseline Pt reports increased anxiety with stair negotiation and is currently unable/unwilling to negotiate stairs   Time 4   Period Weeks   Status On-going           PT Long Term Goals - 02/08/16 1438    PT LONG TERM GOAL #1   Title The patient will demonstrate indpendence with HEP to maximize functional gains made in PT.   Baseline TARGET DATE: 04/02/16   Time 8   Period Weeks   Status On-going   PT LONG TERM GOAL #2   Title The patient will complete TUG in less than or equal to 30 seconds in order to demonstrate improvements in functional mobility.   Baseline TUG = 48.14 sec   Time 8   Period Weeks   Status Achieved   PT LONG TERM GOAL #3   Title The patient will  improve 10 m walk test with LRAD by greater than or equal to 0.6 ft/sec to demonstrate decreased risk for recurrent falls and improved gait efficiency.   Baseline SPC = 0.82 ft/sec, RW = 0.89 ft/sec   Time 8   Period Weeks   Status Achieved   PT LONG TERM GOAL #4   Title The patient will improve 5x sit to stand test score to less than or equal to 30 seconds to demonstrate improvements in speed of movement and LE strength.   Baseline 5x STS = 54.14 SEC   Time 8   Period Weeks   Status Achieved   PT LONG TERM GOAL #5   Title The patient will demonstrate ability to ambulate 4 stairs with UE assist/AD as needed with mod I to demonstrate decreased apprehension, improved functional mobility, and independence with negotiating stairs/steps at home.  Pt apprehensive/fearful of stairs at this time   Baseline Pt currently unable/unwilling to negotiate stairs   Time 8   Period Weeks   Status On-going               Plan - 02/11/16 1905    Clinical Impression Statement Pt has sustained 2 falls in past week. Therefore, current session focused on preventing future falls by providing education on fall prevention, assessing current assistive device, and addressing fall recovery. Pt-stated barrier to use of RW in home is lack of space. When ambulating with B forearm crutches, noted improved gait stability as well as ability to traverse obstacles and narrow walkways. Therefore, recommending B forearm crutches for mobility. Until MD order for forearm crutches received, this PT strongly advised use of RW for all mobility in the interim. Pt verbalized understanding.   Pt will benefit from skilled therapeutic intervention in order to improve on the following deficits Abnormal gait;Decreased activity tolerance;Decreased balance;Decreased endurance;Decreased knowledge of use of DME;Decreased mobility;Decreased strength;Difficulty walking;Increased muscle spasms;Impaired tone;Improper body mechanics;Postural  dysfunction;Pain   Rehab Potential Fair  Will  determine as pt progresses if pt is compliant   Clinical Impairments Affecting Rehab Potential Chronic condition, decreased compliance with PT in past   PT Frequency 2x / week   PT Duration 8 weeks   PT Treatment/Interventions ADLs/Self Care Home Management;Canalith Repostioning;Biofeedback;Electrical Stimulation;Moist Heat;DME Instruction;Gait training;Stair training;Functional mobility training;Therapeutic activities;Therapeutic exercise;Balance training;Neuromuscular re-education;Patient/family education;Orthotic Fit/Training;Wheelchair mobility training;Manual techniques;Passive range of motion   PT Next Visit Plan Continue gait training with LRAD. If MD order received in Epic, may provide forearm crutches from supply closet Carlena Sax will fax to [facility]).  HEP review/progression - LE strengthening, flexibility exercises for L LE, low back   PT Home Exercise Plan Seated hip flexion (marching), seated LAQs   Consulted and Agree with Plan of Care Patient        Problem List Patient Active Problem List   Diagnosis Date Noted  . Postconcussion syndrome 12/30/2014  . Dizziness 12/18/2014  . Tremor 03/11/2014  . 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 Hiatal hernia 03/21/2013  . Anemia, iron deficiency 01/29/2013  . Spasm of muscle 09/02/2012  . Benzodiazepine withdrawal ([facility]) 08/30/2012  . CAP (community acquired pneumonia) 08/29/2012  . Hypernatremia 08/28/2012  . Metabolic acidosis 08/28/2012  . Leukocytosis 08/28/2012  . UTI (lower urinary tract infection) 08/28/2012  . Anemia 08/28/2012  . Acute back pain 08/27/2012  . Rhabdomyolysis 08/27/2012  . ARF (acute renal failure) ([facility]) 08/27/2012  . Hypokalemia 08/27/2012  . Asthma 08/27/2012  . Chronic back pain   . Pernicious anemia 10/30/2011  . DEGENERATIVE DISC DISEASE 08/18/2010  . DEEP VEIN THROMBOSIS/PHLEBITIS 07/28/2010  . Asthma with bronchitis 07/28/2010  . URTICARIA 07/28/2010    Jorje Guild, PT, DPT [facility] [address] [place], [place], [address] Phone: [phone]   Fax:  [phone] 02/11/2016, 7:19 PM   Name: Sherri [name] MRN: [id number] Date of Birth: 18-Mar-1966

## 2016-02-11 NOTE — Telephone Encounter (Signed)
I called the patient. She fell this morning. She thinks it may have been related to wet grass. She fell flat on her face, she said. She states that yesterday she noticed both of her hands tingling around 12. She states her whole body was tingling and tremoring. We discussed that she should be taking baclofen 1.5 tablets in the am 1 tablet midday and 1.5 tablets in the pm. We also discussed that she should be taking gabapentin 1 tablet in the morning, 1 tablet midday and 2 tablets in the evening. She verbalized understanding.

## 2016-02-11 NOTE — Telephone Encounter (Signed)
Pt called and says she is having some tingling in her fingers as of late. She says she had an increase in her baclofen (LIORESAL) 20 MG tablet and gabapentin (NEURONTIN) 300 MG capsule. She is a little confused about when she should be taking her medication. She says she called our on-call service and left a message to speak with physician. She received a call back but tele note was not entered. Pt also says she fell this morning. She fell flat on her face, in the grass. She was walking with her walker but says she might have been carrying to much outside. She also says she has small panic attacks when trying to step on the stairs. Please call and advise [phone] Please do not call daughter.

## 2016-02-11 NOTE — Telephone Encounter (Signed)
Dr. Anne Hahn,  Sherri [name] has sustained 2 falls in the past week while trying to use her rolling walker to traverse grass as well as small spaces inside her home. During today's session, the patient demonstrated increased safety utilizing bilateral forearm crutches to traverse smaller spaces and varying surface types.  Ms. Primm would benefit from personal forearm crutches to maximize safety with mobility and to decrease fall risk.  If you agree, please submit an order for bilateral forearm crutches.  Thank you,  Jorje Guild, PT, DPT [facility] [address] [place], [place], [address] Phone: [phone]   Fax:  [phone] 02/11/2016, 4:40 PM

## 2016-02-14 ENCOUNTER — Telehealth: Payer: Self-pay | Admitting: Neurology

## 2016-02-14 ENCOUNTER — Ambulatory Visit: Payer: Medicare Other | Admitting: Physical Therapy

## 2016-02-14 DIAGNOSIS — M25662 Stiffness of left knee, not elsewhere classified: Secondary | ICD-10-CM

## 2016-02-14 NOTE — Addendum Note (Signed)
Addended by: Stephanie Acre on: 02/14/2016 05:04 PM   Modules accepted: Orders

## 2016-02-14 NOTE — Telephone Encounter (Signed)
Pt has called and still wants to change PT's. She would like to go to [facility] PT. She wants to use a stationary bike, TENS unit and feels she needs to strengthen her legs. She does not want to use a RW. She also would like to have Voltran cream. Please send to [facility]. Please send all medication to them.   Pt has also stated that Dr. Ophelia Charter is also prescribing Gabapentin.  Please call and advise [phone]

## 2016-02-14 NOTE — Telephone Encounter (Signed)
Received message that pt needed PT to return call. Upon return call, pt verbalizes she has experienced increased stiffness in her left lower extremity following last session. She has been using Voltran cream to ease the pain/stiffness in her left leg prn. She was able to go to church yesterday but has found it "difficult to walk." She states that she does not currently feel comfortable negotiating "the hill" in front of her home at this time. This PT recommended the patient use her RW with all ambulation for increased safety. Patient states she is currently ambulating with her cane because she "drags her left leg when she uses the walker." PT emphasized increased safety with RW. PT recommended the patient continue her HEP as indicated, however the patient states she needs more strengthening exercises like a leg press and stationary bike and e-stim for her back. PT stated for the patient to bring in TENS unit for instruction on proper use, however the patient states she must "get the money for a 9V battery."   Celene Squibb, SPT 02/14/16 1:54 pm

## 2016-02-14 NOTE — Telephone Encounter (Signed)
I called the patient. She does not want to use a walker as PT has recommended. She wants to go to [facility] PT to get strength training, instead of "just doing exercises at home." I advised I would ask Dr. Anne Hahn about this. She also stated that she is almost out of her Voltaren gel and would like to know if Dr. Anne Hahn will prescribe more. She initially got this from another doctor. She also stated that she didn't realize she was getting a Rx for gabapentin from Dr. Ophelia Charter (ortho doctor) until she went to pick it up recently. The Rx is for 300 mg three times daily. I advised she take the Rx as Dr. Anne Hahn prescribed (300 mg-1 tablet twice daily and 2 tablets in the evening) and not to get it filled from two doctors anymore. She reluctantly agreed to tell Dr. Ophelia Charter she would get the gabapentin through Dr. Anne Hahn.

## 2016-02-14 NOTE — Telephone Encounter (Signed)
Patient is calling and would like to go to a different PT instead of [facility].  She feels she is needing strengthening of leg more using a leg press. She states she would like to be referred to [facility] PT, [address], [address] [place] [phone].  Thanks!

## 2016-02-14 NOTE — Telephone Encounter (Signed)
I called patient. The patient wants to get a referral to another physical therapy office, I'll set up this referral. Prior clinical examination showed significant variability in the function of the left leg.

## 2016-02-15 ENCOUNTER — Telehealth: Payer: Self-pay | Admitting: Neurology

## 2016-02-15 DIAGNOSIS — F411 Generalized anxiety disorder: Secondary | ICD-10-CM | POA: Diagnosis not present

## 2016-02-15 DIAGNOSIS — F410 Panic disorder [episodic paroxysmal anxiety] without agoraphobia: Secondary | ICD-10-CM | POA: Diagnosis not present

## 2016-02-15 MED ORDER — GABAPENTIN 300 MG PO CAPS: ORAL_CAPSULE | ORAL | Status: DC

## 2016-02-15 NOTE — Telephone Encounter (Signed)
Dr. Willis I will needAnne Hahnreferral for strength training I will send today.  [address] PT. Telephone [phone] fax [phone].

## 2016-02-15 NOTE — Telephone Encounter (Signed)
I called the patient, okay to increase the gabapentin to 600 mg in the morning and evening, 300 mg at midday. I called in another prescription.

## 2016-02-15 NOTE — Telephone Encounter (Signed)
Patient is calling in regard to Rx gabapentin.  She states she would like to increase her morning dosage to 2 tablets in the morning, keeping 1 at lunch and 2 at night.  She states that when she increases to 2 in the morning she can walk much better.  Please call.  Thanks!

## 2016-02-17 ENCOUNTER — Ambulatory Visit: Payer: Medicare Other | Admitting: Physical Therapy

## 2016-02-17 DIAGNOSIS — R2681 Unsteadiness on feet: Secondary | ICD-10-CM | POA: Diagnosis not present

## 2016-02-17 DIAGNOSIS — R269 Unspecified abnormalities of gait and mobility: Secondary | ICD-10-CM

## 2016-02-17 DIAGNOSIS — R258 Other abnormal involuntary movements: Secondary | ICD-10-CM | POA: Diagnosis not present

## 2016-02-17 DIAGNOSIS — R531 Weakness: Secondary | ICD-10-CM | POA: Diagnosis not present

## 2016-02-17 DIAGNOSIS — R29898 Other symptoms and signs involving the musculoskeletal system: Secondary | ICD-10-CM | POA: Diagnosis not present

## 2016-02-17 NOTE — Therapy (Signed)
[facility] [address] [place], [place], [address] Phone: [phone]   Fax:  [phone]  Physical Therapy Treatment  Patient Details  Name: Sherri [name] MRN: [id number] Date of Birth: December 28, 1965 Referring Provider: Kathrynn Ducking.  Encounter Date: 02/17/2016      PT End of Session - 02/17/16 1553    Visit Number 4   Number of Visits 17   Date for PT Re-Evaluation 04/02/16   Authorization Type Medicare primary; [place] secondary   Authorization Time Period G codes every 10th visit   PT Start Time 1402   PT Stop Time 1430  Session ended early due to goals met, pt discharged   PT Time Calculation (min) 28 min   Equipment Utilized During Treatment Gait belt   Activity Tolerance Patient tolerated treatment well   Behavior During Therapy WFL for tasks assessed/performed      Past Medical History  Diagnosis Date  . Pernicious anemia 10/30/2011  . Lumbar herniated disc   . Environmental allergies   . Angioedema   . DVT (deep venous thrombosis) ([place])   . Asthma   . Seasonal allergies   . Urticaria   . Chronic back pain   . Anxiety   . Depression   . GERD (gastroesophageal reflux disease)   . Arthritis   . Chronic kidney disease   . Peripheral vascular disease ([place])   . CAP (community acquired pneumonia) 08/29/2012  . Stiff person syndrome   . Hiatal hernia 03/21/2013  . Cocaine abuse   . Postconcussion syndrome 12/30/2014  . Fall     Past Surgical History  Procedure Laterality Date  . Inguinal hernia 1983  1983  . Colonoscopy  07/09/15    There were no vitals filed for this visit.  Visit Diagnosis:  Abnormality of gait  Unsteadiness  General weakness      Subjective Assessment - 02/17/16 1407    Subjective Pt arrives today with two SPC and lumbar brace and says this will be her last session at this PT clinic. She feels she needs bike and leg press to strengthen herself. Says she has  changed medications according to Dr. Jannifer Franklin.   Pertinent History Stiff person syndrome, pernicious anemia   Patient Stated Goals I'd like to get rid of my cane and increase my strength.   Currently in Pain? No/denies            [facility] Adult PT Treatment/Exercise - 02/17/16 1432    Ambulation/Gait   Ambulation/Gait Yes   Ambulation/Gait Assistance 6: Modified independent (Device/Increase time)   Ambulation/Gait Assistance Details Mod I with 2 canes   Ambulation Distance (Feet) 600 Feet   Assistive device Straight cane  x2   Gait Pattern Step-through pattern   Ambulation Surface Level;Outdoor;Paved   Stairs Yes   Stairs Assistance 6: Modified independent (Device/Increase time)   Stair Management Technique Two rails;Alternating pattern;With cane;Step to pattern  2 SPCs   Number of Stairs 8   Height of Stairs 6   Gait Comments Pt ambulates with step through pattern over paved surfaces with two SPCs at mod I level. Demonstrates shortened step length during descent down incline. Verbal cues to utilize L SPC during ambulation as she intermittently picks cane up and disregards. Pt negotiates stairs mod I using bilateral rails and reciprocal stepping pattern. Pt demonstrates reciprocal pattern with use of SPCs at mod I level, step to pattern during descent.  PT Education - 02/17/16 1551    Education provided Yes   Education Details Pt instructed to utilize RW/bilateral canes for safety during ambulation. Pt instructed to take medications as prescribed by MD.   Terence Lux) Educated Patient   Methods Explanation   Comprehension Verbalized understanding          PT Short Term Goals - 02/17/16 1554    PT SHORT TERM GOAL #1   Title The patient will demonstrate initiation of HEP to maximize functional gains made in PT.   Baseline TARGET DATE = 03/01/16   Time 4   Period Weeks   Status Achieved   PT SHORT TERM GOAL #2   Title The patient will improve TUG by at least 3 seconds  to improve gait efficiency and safe household ambulation.   Baseline TUG: 48.14 sec   Time 4   Period Weeks   Status Achieved   PT SHORT TERM GOAL #3   Title The patient will improve 10 m walk test using LRAD by at least .3 ft/sec in order to demonstrate improved gait efficiency and speed.   Baseline SPC = 0.82 ft/sec, RW = 0.89 ft/sec   Time 4   Period Weeks   Status Achieved   PT SHORT TERM GOAL #4   Title The patient will improve 5x sit to stand test by at least 4 seconds in order to demonstrate improved immediate standing balance and improved LE strength.   Baseline 5x STS = 54.14 sec   Time 4   Period Weeks   Status Achieved   PT SHORT TERM GOAL #5   Title The patient will ambulate 150' outdoors over level paved surfaces mod I with LRAD to improve community ambulation   Baseline The pt ambulated 600' mod I with two SPCs over level paved surfaces. MET = 02/17/16   Time 4   Period Weeks   Status Achieved   PT SHORT TERM GOAL #6   Title The patient will demonstrate ability to negotiate 1 6" step using UE assist/AD as needed in order to decrease anxiety with movement and improve safety with stair negotiation.   Baseline The pt negotiated 8 stairs using UE assist/2 SPCs as needed mod I with reciprocal gait pattern. MET = 02/17/16   Time 4   Period Weeks   Status Achieved           PT Long Term Goals - 02/17/16 1556    PT LONG TERM GOAL #1   Title The patient will demonstrate indpendence with HEP to maximize functional gains made in PT.   Baseline TARGET DATE: 04/02/16   Time 8   Period Weeks   Status Achieved   PT LONG TERM GOAL #2   Title The patient will complete TUG in less than or equal to 30 seconds in order to demonstrate improvements in functional mobility.   Baseline TUG = 48.14 sec   Time 8   Period Weeks   Status Achieved   PT LONG TERM GOAL #3   Title The patient will improve 10 m walk test with LRAD by greater than or equal to 0.6 ft/sec to demonstrate  decreased risk for recurrent falls and improved gait efficiency.   Baseline SPC = 0.82 ft/sec, RW = 0.89 ft/sec   Time 8   Period Weeks   Status Achieved   PT LONG TERM GOAL #4   Title The patient will improve 5x sit to stand test score to less than or equal to 30  seconds to demonstrate improvements in speed of movement and LE strength.   Baseline 5x STS = 54.14 SEC   Time 8   Period Weeks   Status Achieved   PT LONG TERM GOAL #5   Title The patient will demonstrate ability to ambulate 4 stairs with UE assist/AD as needed with mod I to demonstrate decreased apprehension, improved functional mobility, and independence with negotiating stairs/steps at home.  Pt apprehensive/fearful of stairs at this time   Baseline The pt negotiated 8 stairs using UE assist/2 SPCs as needed mod I with reciprocal gait pattern. MET = 12-Mar-2016   Time 8   Period Weeks   Status Achieved            Plan - 03/12/16 1557    Clinical Impression Statement The patient wishes to change PT locations. She has made progress as she has met 6/6 STGs and 5/5 LTGs sporadically throughout her time at this clinic. The pt presents with a highly variable clinical picture during each session and continues to disregard advice regarding use of AD for safety with ambulation. She feels she needs equipment and services not offered at this clinic, pt D/C'ed at this time.   Pt will benefit from skilled therapeutic intervention in order to improve on the following deficits Abnormal gait;Decreased activity tolerance;Decreased balance;Decreased endurance;Decreased knowledge of use of DME;Decreased mobility;Decreased strength;Difficulty walking;Increased muscle spasms;Impaired tone;Improper body mechanics;Postural dysfunction;Pain   Clinical Impairments Affecting Rehab Potential Chronic condition, decreased compliance with PT in past   PT Next Visit Plan Pt D/C'ed at this time   Consulted and Agree with Plan of Care Patient           G-Codes - 2016-03-12 1648    Functional Assessment Tool Used TUG = 12.68  sec with SPC; gait velocity = 2.00 ft/sec with SPC; and 5X STS = 22.47 sec with SPC   Functional Limitation Mobility: Walking and moving around   Mobility: Walking and Moving Around Goal Status [phone]) At least 40 percent but less than 60 percent impaired, limited or restricted   Mobility: Walking and Moving Around Discharge Status [phone]) At least 20 percent but less than 40 percent impaired, limited or restricted      Problem List Patient Active Problem List   Diagnosis Date Noted  . Postconcussion syndrome 12/30/2014  . Dizziness 12/18/2014  . Tremor 03/11/2014  . 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 Hiatal hernia 03/21/2013  . Anemia, iron deficiency 01/29/2013  . Spasm of muscle 09/02/2012  . Benzodiazepine withdrawal ([place]) 08/30/2012  . CAP (community acquired pneumonia) 08/29/2012  . Hypernatremia 08/28/2012  . Metabolic acidosis 06/19/9484  . Leukocytosis 08/28/2012  . UTI (lower urinary tract infection) 08/28/2012  . Anemia 08/28/2012  . Acute back pain 08/27/2012  . Rhabdomyolysis 08/27/2012  . ARF (acute renal failure) ([place]) 08/27/2012  . Hypokalemia 08/27/2012  . Asthma 08/27/2012  . Chronic back pain   . Pernicious anemia 10/30/2011  . DEGENERATIVE DISC DISEASE 08/18/2010  . DEEP VEIN THROMBOSIS/PHLEBITIS 07/28/2010  . Asthma with bronchitis 07/28/2010  . URTICARIA 07/28/2010     PHYSICAL THERAPY DISCHARGE SUMMARY  Visits from Start of Care: 4  Current functional level related to goals / functional outcomes: See above goals and statuses.   Remaining deficits: Patient presents with a highly variable clinical picture session to session. Patient continues to demonstrate varying degrees of gait abnormality, functional LE weakness, rigidity.   Education / Equipment: Pt advised to use RW when she has missed medications/they are not  at max effect. Recommended SPCs when medication  taken. Plan: Patient agrees to discharge.  Patient goals were met. Patient is being discharged due to the patient's request.  ?????       De Nurse, SPT 02/17/2016, 4:54 PM  [facility] [address] [place], [place], [address] Phone: [phone]   Fax:  [phone]  Name: Sherri [name] MRN: [id number] Date of Birth: 1966-08-02

## 2016-02-21 ENCOUNTER — Ambulatory Visit: Payer: Self-pay | Admitting: Rehabilitative and Restorative Service Providers"

## 2016-02-21 DIAGNOSIS — M6281 Muscle weakness (generalized): Secondary | ICD-10-CM | POA: Diagnosis not present

## 2016-02-21 DIAGNOSIS — M545 Low back pain: Secondary | ICD-10-CM | POA: Diagnosis not present

## 2016-02-21 DIAGNOSIS — R2681 Unsteadiness on feet: Secondary | ICD-10-CM | POA: Diagnosis not present

## 2016-02-23 ENCOUNTER — Ambulatory Visit: Payer: Self-pay | Admitting: Rehabilitative and Restorative Service Providers"

## 2016-02-24 DIAGNOSIS — M545 Low back pain: Secondary | ICD-10-CM | POA: Diagnosis not present

## 2016-02-24 DIAGNOSIS — M6281 Muscle weakness (generalized): Secondary | ICD-10-CM | POA: Diagnosis not present

## 2016-02-24 DIAGNOSIS — R2681 Unsteadiness on feet: Secondary | ICD-10-CM | POA: Diagnosis not present

## 2016-02-28 ENCOUNTER — Ambulatory Visit: Payer: Self-pay | Admitting: Rehabilitative and Restorative Service Providers"

## 2016-02-29 DIAGNOSIS — M545 Low back pain: Secondary | ICD-10-CM | POA: Diagnosis not present

## 2016-02-29 DIAGNOSIS — M6281 Muscle weakness (generalized): Secondary | ICD-10-CM | POA: Diagnosis not present

## 2016-02-29 DIAGNOSIS — R2681 Unsteadiness on feet: Secondary | ICD-10-CM | POA: Diagnosis not present

## 2016-03-01 ENCOUNTER — Ambulatory Visit: Payer: Self-pay | Admitting: Rehabilitative and Restorative Service Providers"

## 2016-03-02 DIAGNOSIS — R2681 Unsteadiness on feet: Secondary | ICD-10-CM | POA: Diagnosis not present

## 2016-03-02 DIAGNOSIS — M545 Low back pain: Secondary | ICD-10-CM | POA: Diagnosis not present

## 2016-03-02 DIAGNOSIS — M6281 Muscle weakness (generalized): Secondary | ICD-10-CM | POA: Diagnosis not present

## 2016-03-06 ENCOUNTER — Ambulatory Visit: Payer: Self-pay | Admitting: Rehabilitative and Restorative Service Providers"

## 2016-03-07 DIAGNOSIS — R2681 Unsteadiness on feet: Secondary | ICD-10-CM | POA: Diagnosis not present

## 2016-03-07 DIAGNOSIS — M6281 Muscle weakness (generalized): Secondary | ICD-10-CM | POA: Diagnosis not present

## 2016-03-07 DIAGNOSIS — M545 Low back pain: Secondary | ICD-10-CM | POA: Diagnosis not present

## 2016-03-08 ENCOUNTER — Ambulatory Visit: Payer: Self-pay | Admitting: Physical Therapy

## 2016-03-09 DIAGNOSIS — M545 Low back pain: Secondary | ICD-10-CM | POA: Diagnosis not present

## 2016-03-09 DIAGNOSIS — M6281 Muscle weakness (generalized): Secondary | ICD-10-CM | POA: Diagnosis not present

## 2016-03-09 DIAGNOSIS — R2681 Unsteadiness on feet: Secondary | ICD-10-CM | POA: Diagnosis not present

## 2016-03-13 ENCOUNTER — Ambulatory Visit: Payer: Self-pay | Admitting: Rehabilitative and Restorative Service Providers"

## 2016-03-14 DIAGNOSIS — F410 Panic disorder [episodic paroxysmal anxiety] without agoraphobia: Secondary | ICD-10-CM | POA: Diagnosis not present

## 2016-03-14 DIAGNOSIS — F411 Generalized anxiety disorder: Secondary | ICD-10-CM | POA: Diagnosis not present

## 2016-03-15 ENCOUNTER — Ambulatory Visit: Payer: Self-pay | Admitting: Physical Therapy

## 2016-03-16 DIAGNOSIS — M545 Low back pain: Secondary | ICD-10-CM | POA: Diagnosis not present

## 2016-03-16 DIAGNOSIS — M6281 Muscle weakness (generalized): Secondary | ICD-10-CM | POA: Diagnosis not present

## 2016-03-16 DIAGNOSIS — R2681 Unsteadiness on feet: Secondary | ICD-10-CM | POA: Diagnosis not present

## 2016-03-20 ENCOUNTER — Ambulatory Visit: Payer: Self-pay | Admitting: Rehabilitative and Restorative Service Providers"

## 2016-03-21 DIAGNOSIS — R2681 Unsteadiness on feet: Secondary | ICD-10-CM | POA: Diagnosis not present

## 2016-03-21 DIAGNOSIS — M545 Low back pain: Secondary | ICD-10-CM | POA: Diagnosis not present

## 2016-03-21 DIAGNOSIS — M6281 Muscle weakness (generalized): Secondary | ICD-10-CM | POA: Diagnosis not present

## 2016-03-22 ENCOUNTER — Ambulatory Visit: Payer: Self-pay | Admitting: Physical Therapy

## 2016-03-23 DIAGNOSIS — M6281 Muscle weakness (generalized): Secondary | ICD-10-CM | POA: Diagnosis not present

## 2016-03-23 DIAGNOSIS — M545 Low back pain: Secondary | ICD-10-CM | POA: Diagnosis not present

## 2016-03-23 DIAGNOSIS — R2681 Unsteadiness on feet: Secondary | ICD-10-CM | POA: Diagnosis not present

## 2016-03-28 DIAGNOSIS — M545 Low back pain: Secondary | ICD-10-CM | POA: Diagnosis not present

## 2016-03-28 DIAGNOSIS — M6281 Muscle weakness (generalized): Secondary | ICD-10-CM | POA: Diagnosis not present

## 2016-03-28 DIAGNOSIS — R2681 Unsteadiness on feet: Secondary | ICD-10-CM | POA: Diagnosis not present

## 2016-03-30 DIAGNOSIS — M6281 Muscle weakness (generalized): Secondary | ICD-10-CM | POA: Diagnosis not present

## 2016-03-30 DIAGNOSIS — M545 Low back pain: Secondary | ICD-10-CM | POA: Diagnosis not present

## 2016-03-30 DIAGNOSIS — R2681 Unsteadiness on feet: Secondary | ICD-10-CM | POA: Diagnosis not present

## 2016-04-04 DIAGNOSIS — R2681 Unsteadiness on feet: Secondary | ICD-10-CM | POA: Diagnosis not present

## 2016-04-04 DIAGNOSIS — M6281 Muscle weakness (generalized): Secondary | ICD-10-CM | POA: Diagnosis not present

## 2016-04-04 DIAGNOSIS — M545 Low back pain: Secondary | ICD-10-CM | POA: Diagnosis not present

## 2016-04-07 ENCOUNTER — Encounter: Payer: Self-pay | Admitting: Neurology

## 2016-04-07 ENCOUNTER — Telehealth: Payer: Self-pay | Admitting: Neurology

## 2016-04-07 NOTE — Telephone Encounter (Signed)
I called patient. The patient indicates that she had a recent fall, she has some numbness on the top of the foot on the left. She did have nerve conduction and EMG evaluations done in February of this year, the studies were unremarkable. We may need to readdress this in her May revisit. The patient wants to know what prescription she might have if she goes back to work, I indicated that if she has a relatively sedentary job without a lot of heavy lifting, she could likely perform this. I will write a letter to this regard.

## 2016-04-07 NOTE — Telephone Encounter (Signed)
Patient called to ask Dr. Anne HahnWillis if she can return to work full time.

## 2016-04-10 NOTE — Telephone Encounter (Signed)
Letter completed and signed. Spoke to pt. Placed up front and she agreed to pick up tomorrow when she comes for therapy.

## 2016-04-11 DIAGNOSIS — F410 Panic disorder [episodic paroxysmal anxiety] without agoraphobia: Secondary | ICD-10-CM | POA: Diagnosis not present

## 2016-04-11 DIAGNOSIS — R2681 Unsteadiness on feet: Secondary | ICD-10-CM | POA: Diagnosis not present

## 2016-04-11 DIAGNOSIS — M6281 Muscle weakness (generalized): Secondary | ICD-10-CM | POA: Diagnosis not present

## 2016-04-11 DIAGNOSIS — M545 Low back pain: Secondary | ICD-10-CM | POA: Diagnosis not present

## 2016-04-11 DIAGNOSIS — F411 Generalized anxiety disorder: Secondary | ICD-10-CM | POA: Diagnosis not present

## 2016-04-13 ENCOUNTER — Ambulatory Visit: Payer: Medicare Other | Admitting: Hematology & Oncology

## 2016-04-13 ENCOUNTER — Other Ambulatory Visit: Payer: Medicare Other

## 2016-04-13 ENCOUNTER — Ambulatory Visit: Payer: Medicare Other

## 2016-04-19 DIAGNOSIS — I129 Hypertensive chronic kidney disease with stage 1 through stage 4 chronic kidney disease, or unspecified chronic kidney disease: Secondary | ICD-10-CM | POA: Diagnosis not present

## 2016-04-19 DIAGNOSIS — F419 Anxiety disorder, unspecified: Secondary | ICD-10-CM | POA: Diagnosis not present

## 2016-04-19 DIAGNOSIS — N182 Chronic kidney disease, stage 2 (mild): Secondary | ICD-10-CM | POA: Diagnosis not present

## 2016-04-19 DIAGNOSIS — W19XXXA Unspecified fall, initial encounter: Secondary | ICD-10-CM | POA: Diagnosis not present

## 2016-04-20 ENCOUNTER — Ambulatory Visit (HOSPITAL_BASED_OUTPATIENT_CLINIC_OR_DEPARTMENT_OTHER): Payer: Medicare Other | Admitting: Family

## 2016-04-20 ENCOUNTER — Other Ambulatory Visit (HOSPITAL_BASED_OUTPATIENT_CLINIC_OR_DEPARTMENT_OTHER): Payer: Medicare Other

## 2016-04-20 ENCOUNTER — Ambulatory Visit (HOSPITAL_BASED_OUTPATIENT_CLINIC_OR_DEPARTMENT_OTHER): Payer: Medicare Other

## 2016-04-20 ENCOUNTER — Encounter: Payer: Self-pay | Admitting: Family

## 2016-04-20 VITALS — BP 118/71 | HR 72 | Temp 98.4°F | Resp 18 | Ht 63.0 in | Wt 138.0 lb

## 2016-04-20 DIAGNOSIS — F410 Panic disorder [episodic paroxysmal anxiety] without agoraphobia: Secondary | ICD-10-CM | POA: Diagnosis not present

## 2016-04-20 DIAGNOSIS — D509 Iron deficiency anemia, unspecified: Secondary | ICD-10-CM | POA: Diagnosis not present

## 2016-04-20 DIAGNOSIS — I82409 Acute embolism and thrombosis of unspecified deep veins of unspecified lower extremity: Secondary | ICD-10-CM | POA: Diagnosis not present

## 2016-04-20 DIAGNOSIS — D513 Other dietary vitamin B12 deficiency anemia: Secondary | ICD-10-CM | POA: Diagnosis not present

## 2016-04-20 DIAGNOSIS — E018 Other iodine-deficiency related thyroid disorders and allied conditions: Secondary | ICD-10-CM | POA: Diagnosis not present

## 2016-04-20 DIAGNOSIS — D510 Vitamin B12 deficiency anemia due to intrinsic factor deficiency: Secondary | ICD-10-CM

## 2016-04-20 LAB — COMPREHENSIVE METABOLIC PANEL (CC13)
ALT: 12 IU/L (ref 0–32)
AST: 13 IU/L (ref 0–40)
Albumin, Serum: 4.2 g/dL (ref 3.5–5.5)
Albumin/Globulin Ratio: 1.4 (ref 1.2–2.2)
Alkaline Phosphatase, S: 116 IU/L (ref 39–117)
BUN/Creatinine Ratio: 9 (ref 9–23)
BUN: 9 mg/dL (ref 6–24)
Bilirubin Total: <0.2 mg/dL (ref 0.0–1.2)
CALCIUM: 9.6 mg/dL (ref 8.7–10.2)
CO2: 24 mmol/L (ref 18–29)
CREATININE: 0.99 mg/dL (ref 0.57–1.00)
Chloride, Ser: 109 mmol/L — ABNORMAL HIGH (ref 96–106)
GFR calc Af Amer: 76 mL/min/{1.73_m2} (ref >59)
GFR, EST NON AFRICAN AMERICAN: 66 mL/min/{1.73_m2} (ref >59)
Globulin, Total: 3.1 g/dL (ref 1.5–4.5)
Glucose: 118 mg/dL — ABNORMAL HIGH (ref 65–99)
Potassium, Ser: 4 mmol/L (ref 3.5–5.2)
Sodium: 142 mmol/L (ref 134–144)
Total Protein: 7.3 g/dL (ref 6.0–8.5)

## 2016-04-20 LAB — CBC WITH DIFFERENTIAL (CANCER CENTER ONLY)
BASO#: 0 10*3/uL (ref 0.0–0.2)
BASO%: 0 % (ref 0.0–2.0)
EOS%: 0 % (ref 0.0–7.0)
Eosinophils Absolute: 0 10*3/uL (ref 0.0–0.5)
HCT: 36.5 % (ref 34.8–46.6)
HGB: 12.4 g/dL (ref 11.6–15.9)
LYMPH#: 1.9 10*3/uL (ref 0.9–3.3)
LYMPH%: 38.3 % (ref 14.0–48.0)
MCH: 28.9 pg (ref 26.0–34.0)
MCHC: 34 g/dL (ref 32.0–36.0)
MCV: 85 fL (ref 81–101)
MONO#: 0.3 10*3/uL (ref 0.1–0.9)
MONO%: 6 % (ref 0.0–13.0)
NEUT#: 2.7 10*3/uL (ref 1.5–6.5)
NEUT%: 55.7 % (ref 39.6–80.0)
Platelets: 259 10*3/uL (ref 145–400)
RBC: 4.29 10*6/uL (ref 3.70–5.32)
RDW: 12.5 % (ref 11.1–15.7)
WBC: 4.9 10*3/uL (ref 3.9–10.0)

## 2016-04-20 LAB — RETICULOCYTES: Reticulocyte Count: 0.8 % (ref 0.6–2.6)

## 2016-04-20 MED ORDER — CYANOCOBALAMIN 1000 MCG/ML IJ SOLN
1000.0000 ug | Freq: Once | INTRAMUSCULAR | Status: AC
  Administered 2016-04-20: 1000 ug via INTRAMUSCULAR

## 2016-04-20 NOTE — Progress Notes (Signed)
Hematology and Oncology Follow Up Visit  Sherri [name] [id number] [birth year] 50 y.o. 04/20/2016   Principle Diagnosis:  1. Pernicious anemia - anti-intrinsic factor antibodies.  2. Intermittent iron-deficiency anemia.  4. 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   Current Therapy:   1. Vitamin B12, 1 mg IM q.4 months.  2. IV iron as indicated.    Interim History: Sherri [name] is here for a follow-up today. She is doing fairly well. She is under a lot of stress at home with her daughter. Hopefully, this will dissipate as her daughter plans to move out on May 1st.  She received 2 doses of Feraheme in October of last year and has had a nice response. Her iron saturation in December was 40% with a ferritin of 751. Her hgb is now 12.54 with an MCV of 85.  She has had several more falls due to her legs going still. Thankfully she was not seriously injured. She uses a walker or two canes to ambulate. She has had no syncopal episodes.  No fever, chills, n/v, cough, rash, headache, dizziness, SOB, chest pain, palpitations, abdominal pain or changes in bowel or bladder habits.  No lymphadenopathy found on exam. No episodes of bleeding or bruising.  No swelling, tenderness or numbness in her extremities. The intermittent tingling in her hands and feet is unchanged.  She states that her appetite is good and she is staying well hydrated. Her weight is down 20 lbs since her last visit in December.   Medications:    Medication List       This list is accurate as of: 04/20/16  2:47 PM.  Always use your most recent med list.               albuterol 108 (90 Base) MCG/ACT inhaler  Commonly known as:  PROVENTIL HFA;VENTOLIN HFA  Inhale 1 puff into the lungs every 6 (six) hours as needed. PRO AIR     baclofen 20 MG tablet  Commonly known as:  LIORESAL  1.5 tablets in the morning and evening, 1 tablet at mid-day     cholecalciferol 1000 units tablet  Commonly known as:  VITAMIN D    Take 1,000 Units by mouth daily.     cyanocobalamin 1000 MCG/ML injection  Commonly known as:  (VITAMIN B-12)  Inject 1,000 mcg into the muscle every 3 (three) months.     cyclobenzaprine 10 MG tablet  Commonly known as:  FLEXERIL  Take 10 mg by mouth at bedtime. Reported on 02/02/2016     desloratadine 5 MG tablet  Commonly known as:  CLARINEX  Take 1 tablet (5 mg total) by mouth daily.     diclofenac sodium 1 % Gel  Commonly known as:  VOLTAREN  Apply 2 g topically 4 (four) times daily.     diphenhydrAMINE 25 MG tablet  Commonly known as:  BENADRYL  Take 25 mg by mouth at bedtime as needed.     EPIPEN 2-PAK 0.3 mg/0.3 mL Soaj injection  Generic drug:  EPINEPHrine  USE AS DIRECTED FOR  SEVERE  ALLERGIC  REACTIONS     famotidine 20 MG tablet  Commonly known as:  PEPCID  Take 20 mg by mouth daily.     Fluticasone-Salmeterol 250-50 MCG/DOSE Aepb  Commonly known as:  ADVAIR  Inhale 1 puff into the lungs 2 (two) times daily.     gabapentin 300 MG capsule  Commonly known as:  NEURONTIN  2 capsules in the morning  and evening, one capsule at midday     levocetirizine 5 MG tablet  Commonly known as:  XYZAL  Take 1 tablet (5 mg total) by mouth every evening.     loratadine 10 MG tablet  Commonly known as:  CLARITIN  Take 10 mg by mouth 2 (two) times daily.     montelukast 10 MG tablet  Commonly known as:  SINGULAIR  Take by mouth.        Allergies:  Allergies  Allergen Reactions  . Ibuprofen Other (See Comments)    Does not take due to hx of renal insufficiency  . Tylenol [Acetaminophen] Hives    Cannot take large quantities  . Amoxicillin Diarrhea    Past Medical History, Surgical history, Social history, and Family History were reviewed and updated.  Review of Systems: All other 10 point review of systems is negative.   Physical Exam:  height is  (1.6 m) and weight is 138 lb (62.596 kg). Her oral temperature is 98.4 F (36.9 C). Her blood pressure  is 118/71 and her pulse is 72. Her respiration is 18.   Wt Readings from Last 3 Encounters:  04/20/16 138 lb (62.596 kg)  01/18/16 159 lb (72.122 kg)  12/14/15 153 lb (69.4 kg)    Ocular: Sclerae unicteric, pupils equal, round and reactive to light Ear-nose-throat: Oropharynx clear, dentition fair Lymphatic: No cervical supraclavicular or axillary adenopathy Lungs no rales or rhonchi, good excursion bilaterally Heart regular rate and rhythm, no murmur appreciated Abd soft, nontender, positive bowel sounds, no liver or spleen tip palpated on exam, no fluid wave MSK no focal spinal tenderness, no joint edema Neuro: non-focal, well-oriented, appropriate affect Breasts: Deferred  Lab Results  Component Value Date   WBC 4.9 04/20/2016   HGB 12.4 04/20/2016   HCT 36.5 04/20/2016   MCV 85 04/20/2016   PLT 259 04/20/2016   Lab Results  Component Value Date   FERRITIN 751* 12/14/2015   IRON 89 12/14/2015   TIBC 223* 12/14/2015   UIBC 134 12/14/2015   IRONPCTSAT 40 12/14/2015   Lab Results  Component Value Date   RETICCTPCT 1.8 12/14/2015   RBC 4.29 04/20/2016   RETICCTABS 71.8 12/14/2015   Lab Results  Component Value Date   KPAFRELGTCHN 1.06 08/02/2011   LAMBDASER 1.49 08/02/2011   KAPLAMBRATIO 0.71 08/02/2011   Lab Results  Component Value Date   IGGSERUM 1180 08/02/2011   IGA 206 08/02/2011   IGMSERUM 198 08/02/2011   Lab Results  Component Value Date   TOTALPROTELP 6.6 08/02/2011   ALBUMINELP 57.1 08/02/2011   A1GS 4.1 08/02/2011   A2GS 9.6 08/02/2011   BETS 6.1 08/02/2011   BETA2SER 5.9 08/02/2011   GAMS 17.2 08/02/2011   MSPIKE NOT DET 08/02/2011   SPEI * 08/02/2011     Chemistry      Component Value Date/Time   NA 145* 01/18/2016 0855   NA 143 12/14/2015 1022   NA 145 06/09/2015 1022   NA 146* 03/10/2015 1021   K 4.6 01/18/2016 0855   K 3.8 12/14/2015 1022   K 3.8 03/10/2015 1021   CL 104 01/18/2016 0855   CL 106 03/10/2015 1021   CO2 27  01/18/2016 0855   CO2 26 12/14/2015 1022   CO2 28 03/10/2015 1021   BUN 14 01/18/2016 0855   BUN 9.9 12/14/2015 1022   BUN 11 06/09/2015 1022   BUN 14 03/10/2015 1021   CREATININE 0.97 01/18/2016 0855   CREATININE 1.1 12/14/2015 1022  CREATININE 0.9 03/10/2015 1021      Component Value Date/Time   CALCIUM 9.5 01/18/2016 0855   CALCIUM 9.7 12/14/2015 1022   CALCIUM 9.1 03/10/2015 1021   ALKPHOS 98 01/18/2016 0855   ALKPHOS 104 12/14/2015 1022   ALKPHOS 94* 03/10/2015 1021   AST 12 01/18/2016 0855   AST 17 12/14/2015 1022   AST 20 03/10/2015 1021   ALT 14 01/18/2016 0855   ALT 14 12/14/2015 1022   ALT 14 03/10/2015 1021   BILITOT <0.2 01/18/2016 0855   BILITOT <0.30 12/14/2015 1022   BILITOT 0.3 06/09/2015 1022   BILITOT 0.40 03/10/2015 1021     Impression and Plan: Sherri [name] is 50 year old African American female with pernicious anemia and intermittent iron deficiency anemia. She received 2 doses of Feraheme in October and had a nice response. She is doing fairly well at this time but is still under a good deal of stress at home which has her fatigued.  We will get her B12 injection today.  We will see what her iron studies show and bring her back in next week for an infusion if needed.  We will plan to see her back in 4 months for labs and follow-up.  She is knows to call here with any questions or concerns. We can certainly see her sooner if need be.   Verdie Mosher, NP 4/27/20172:47 PM

## 2016-04-20 NOTE — Patient Instructions (Signed)

## 2016-04-21 ENCOUNTER — Telehealth: Payer: Self-pay | Admitting: Nurse Practitioner

## 2016-04-21 LAB — FERRITIN: Ferritin: 506 ng/ml — ABNORMAL HIGH (ref 9–269)

## 2016-04-21 LAB — IRON AND TIBC
%SAT: 46 % (ref 21–57)
Iron: 106 ug/dL (ref 41–142)
TIBC: 231 ug/dL — ABNORMAL LOW (ref 236–444)
UIBC: 125 ug/dL (ref 120–384)

## 2016-04-21 LAB — VITAMIN B12: Vitamin B12: 379 pg/mL (ref 211–946)

## 2016-04-21 LAB — VITAMIN D 25 HYDROXY (VIT D DEFICIENCY, FRACTURES): VIT D 25 HYDROXY: 20.5 ng/mL — AB (ref 30.0–100.0)

## 2016-04-21 NOTE — Telephone Encounter (Addendum)
LVM for pt to call office to discuss.----- Message from Verdie MosherSarah M [place], NP sent at 04/21/2016  9:03 AM EDT ----- Regarding: Vit D Vit D is low. Is she taking her daily supplement? Thanks!  Sarah  ----- Message -----    From: [facility]    Sent: 04/20/2016   2:38 PM      To: Verdie MosherSarah M [place], NP

## 2016-04-25 ENCOUNTER — Telehealth: Payer: Self-pay | Admitting: *Deleted

## 2016-04-25 DIAGNOSIS — S3992XA Unspecified injury of lower back, initial encounter: Secondary | ICD-10-CM | POA: Diagnosis not present

## 2016-04-25 DIAGNOSIS — R4182 Altered mental status, unspecified: Secondary | ICD-10-CM | POA: Diagnosis not present

## 2016-04-25 DIAGNOSIS — Z79899 Other long term (current) drug therapy: Secondary | ICD-10-CM | POA: Diagnosis not present

## 2016-04-25 DIAGNOSIS — S24109A Unspecified injury at unspecified level of thoracic spinal cord, initial encounter: Secondary | ICD-10-CM | POA: Diagnosis not present

## 2016-04-25 DIAGNOSIS — J9811 Atelectasis: Secondary | ICD-10-CM | POA: Diagnosis not present

## 2016-04-25 DIAGNOSIS — Z5181 Encounter for therapeutic drug level monitoring: Secondary | ICD-10-CM | POA: Diagnosis not present

## 2016-04-25 DIAGNOSIS — R918 Other nonspecific abnormal finding of lung field: Secondary | ICD-10-CM | POA: Diagnosis not present

## 2016-04-25 DIAGNOSIS — S0003XA Contusion of scalp, initial encounter: Secondary | ICD-10-CM | POA: Diagnosis not present

## 2016-04-25 DIAGNOSIS — K449 Diaphragmatic hernia without obstruction or gangrene: Secondary | ICD-10-CM | POA: Diagnosis not present

## 2016-04-25 DIAGNOSIS — Z041 Encounter for examination and observation following transport accident: Secondary | ICD-10-CM | POA: Diagnosis not present

## 2016-04-25 NOTE — Telephone Encounter (Addendum)
Attempted to call patient several times to relay results and get clarification on dosing without return call. Multiple messages left, last one on 04/25/16 to call office back.   ----- Message from Verdie MosherSarah M [place], NP sent at 04/21/2016  9:03 AM EDT ----- Regarding: Vit D Vit D is low. Is she taking her daily supplement? Thanks!  Sarah  ----- Message -----    From: [facility]    Sent: 04/20/2016   2:38 PM      To: Verdie MosherSarah M [place], NP

## 2016-05-01 ENCOUNTER — Telehealth: Payer: Self-pay | Admitting: *Deleted
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
## 2016-05-08 ENCOUNTER — Telehealth: Payer: Self-pay | Admitting: Neurology

## 2016-05-08 NOTE — Telephone Encounter (Signed)
I called patient. She has cut back her baclofen taking 20 mg 3 times a day, and taking the gabapentin 300 mg 3 times daily. We are not giving her diazepam, not sure where she is getting this prescription. The patient has a history of substance abuse. I'm not sure that this patient qualifies for in-home nurse that she has a need for wound care.
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
## 2016-05-09 DIAGNOSIS — F411 Generalized anxiety disorder: Secondary | ICD-10-CM | POA: Diagnosis not present

## 2016-05-09 DIAGNOSIS — F410 Panic disorder [episodic paroxysmal anxiety] without agoraphobia: Secondary | ICD-10-CM | POA: Diagnosis not present

## 2016-05-18 ENCOUNTER — Encounter: Payer: Self-pay | Admitting: Neurology

## 2016-05-18 ENCOUNTER — Ambulatory Visit (INDEPENDENT_AMBULATORY_CARE_PROVIDER_SITE_OTHER): Payer: Medicare Other | Admitting: Neurology

## 2016-05-18 VITALS — BP 124/78 | HR 76 | Ht 63.0 in | Wt 144.0 lb

## 2016-05-18 DIAGNOSIS — M6281 Muscle weakness (generalized): Secondary | ICD-10-CM | POA: Diagnosis not present

## 2016-05-18 DIAGNOSIS — G2582 Stiff-man syndrome: Secondary | ICD-10-CM | POA: Diagnosis not present

## 2016-05-18 DIAGNOSIS — R251 Tremor, unspecified: Secondary | ICD-10-CM

## 2016-05-18 DIAGNOSIS — R2681 Unsteadiness on feet: Secondary | ICD-10-CM | POA: Diagnosis not present

## 2016-05-18 DIAGNOSIS — M545 Low back pain: Secondary | ICD-10-CM | POA: Diagnosis not present

## 2016-05-18 MED ORDER — FLUOXETINE HCL 10 MG PO CAPS: 10.0000 mg | ORAL_CAPSULE | Freq: Every day | ORAL | Status: DC
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
## 2016-05-29 DIAGNOSIS — M545 Low back pain: Secondary | ICD-10-CM | POA: Diagnosis not present

## 2016-05-29 DIAGNOSIS — R2681 Unsteadiness on feet: Secondary | ICD-10-CM | POA: Diagnosis not present

## 2016-05-29 DIAGNOSIS — M6281 Muscle weakness (generalized): Secondary | ICD-10-CM | POA: Diagnosis not present

## 2016-06-01 DIAGNOSIS — M4696 Unspecified inflammatory spondylopathy, lumbar region: Secondary | ICD-10-CM | POA: Diagnosis not present

## 2016-06-01 DIAGNOSIS — Z79899 Other long term (current) drug therapy: Secondary | ICD-10-CM | POA: Diagnosis not present

## 2016-06-01 DIAGNOSIS — M47817 Spondylosis without myelopathy or radiculopathy, lumbosacral region: Secondary | ICD-10-CM | POA: Diagnosis not present

## 2016-06-01 DIAGNOSIS — M5137 Other intervertebral disc degeneration, lumbosacral region: Secondary | ICD-10-CM | POA: Diagnosis not present

## 2016-06-01 DIAGNOSIS — Z79891 Long term (current) use of opiate analgesic: Secondary | ICD-10-CM | POA: Diagnosis not present

## 2016-06-01 DIAGNOSIS — G894 Chronic pain syndrome: Secondary | ICD-10-CM | POA: Diagnosis not present

## 2016-06-15 ENCOUNTER — Telehealth: Payer: Self-pay | Admitting: *Deleted

## 2016-06-15 NOTE — Telephone Encounter (Signed)
Pt office note, and EMG faxed to [facility] on 06/15/2016.

## 2016-06-20 DIAGNOSIS — M47817 Spondylosis without myelopathy or radiculopathy, lumbosacral region: Secondary | ICD-10-CM | POA: Diagnosis not present

## 2016-06-20 DIAGNOSIS — G894 Chronic pain syndrome: Secondary | ICD-10-CM | POA: Diagnosis not present

## 2016-06-20 DIAGNOSIS — Z79891 Long term (current) use of opiate analgesic: Secondary | ICD-10-CM | POA: Diagnosis not present

## 2016-06-20 DIAGNOSIS — Z79899 Other long term (current) drug therapy: Secondary | ICD-10-CM | POA: Diagnosis not present

## 2016-06-28 DIAGNOSIS — M6281 Muscle weakness (generalized): Secondary | ICD-10-CM | POA: Diagnosis not present

## 2016-06-28 DIAGNOSIS — R2681 Unsteadiness on feet: Secondary | ICD-10-CM | POA: Diagnosis not present

## 2016-06-28 DIAGNOSIS — M545 Low back pain: Secondary | ICD-10-CM | POA: Diagnosis not present

## 2016-06-29 DIAGNOSIS — G894 Chronic pain syndrome: Secondary | ICD-10-CM | POA: Diagnosis not present

## 2016-06-29 DIAGNOSIS — Z79891 Long term (current) use of opiate analgesic: Secondary | ICD-10-CM | POA: Diagnosis not present

## 2016-06-29 DIAGNOSIS — M4696 Unspecified inflammatory spondylopathy, lumbar region: Secondary | ICD-10-CM | POA: Diagnosis not present

## 2016-06-29 DIAGNOSIS — M47817 Spondylosis without myelopathy or radiculopathy, lumbosacral region: Secondary | ICD-10-CM | POA: Diagnosis not present

## 2016-06-29 DIAGNOSIS — M5137 Other intervertebral disc degeneration, lumbosacral region: Secondary | ICD-10-CM | POA: Diagnosis not present

## 2016-06-29 DIAGNOSIS — Z79899 Other long term (current) drug therapy: Secondary | ICD-10-CM | POA: Diagnosis not present

## 2016-07-03 ENCOUNTER — Ambulatory Visit (HOSPITAL_COMMUNITY)
Admission: EM | Admit: 2016-07-03 | Discharge: 2016-07-03 | Disposition: A | Discharge status: Home / Self Care | Payer: Medicare Other | Attending: Emergency Medicine | Admitting: Emergency Medicine

## 2016-07-03 ENCOUNTER — Encounter (HOSPITAL_COMMUNITY): Payer: Self-pay | Admitting: Emergency Medicine

## 2016-07-03 DIAGNOSIS — T148 Other injury of unspecified body region: Secondary | ICD-10-CM

## 2016-07-03 DIAGNOSIS — W57XXXA Bitten or stung by nonvenomous insect and other nonvenomous arthropods, initial encounter: Secondary | ICD-10-CM | POA: Diagnosis not present

## 2016-07-03 MED ORDER — MUPIROCIN 2 % EX OINT: 1.0000 "application " | TOPICAL_OINTMENT | Freq: Three times a day (TID) | CUTANEOUS | Status: DC

## 2016-07-03 NOTE — ED Notes (Signed)
Patient reports she recently moved into this home approximately one month ago.  Patient noticed insect/spider bite to back of right leg and left inner knee.  Currently, sites do not itch.  Patient used clorox on sites.

## 2016-07-03 NOTE — ED Provider Notes (Signed)
HPI  SUBJECTIVE:  Sherri [name] is a 50 y.o. female who presents with several erythematous, nonpainful, nonpruritic marks on her bilateral lower extremities. Denies increased temperature. States that she woke up early this morning, feeling that she was "being bitten by something". Reports sharp pain in the areas. She did not see what exactly was biting her. Reports having multiple insects in the house including spiders, aunts. States that she has recently moved, and is sleeping on a sofa, but she is not sure if it has bedbugs or not. She tried Clorox which seemed to help. There are no aggravating or alleviating factors. She denies fevers, drainage, rash elsewhere. No contacts with similar rash. No blood on the bed closed this morning, no pets in the home, no exposure to poison ivy. Patient has an extensive past medical history including DVT, urticaria, chronic kidney disease, peripheral vascular disease, cocaine abuse. No history of MRSA, diabetes, hypertension.   Past Medical History  Diagnosis Date  . Pernicious anemia 10/30/2011  . Lumbar herniated disc   . Environmental allergies   . Angioedema   . DVT (deep venous thrombosis) (HCC)   . Asthma   . Seasonal allergies   . Urticaria   . Chronic back pain   . Anxiety   . Depression   . GERD (gastroesophageal reflux disease)   . Arthritis   . Chronic kidney disease   . Peripheral vascular disease (HCC)   . CAP (community acquired pneumonia) 08/29/2012  . Stiff person syndrome   . Hiatal hernia 03/21/2013  . Cocaine abuse   . Postconcussion syndrome 12/30/2014  . Fall     Past Surgical History  Procedure Laterality Date  . Inguinal hernia 1983  1983  . Colonoscopy  07/09/15    Family History  Problem Relation Age of Onset  . Cancer Mother   . Other Mother   . COPD Father   . Asthma Brother   . Cancer Brother     colon  . Heart attack Brother   . Seizures Brother     Social History  Substance Use Topics  . Smoking status:  Never Smoker   . Smokeless tobacco: Never Used     Comment: Never Used Tobacco  . Alcohol Use: 0.0 oz/week    0 Standard drinks or equivalent per week     Comment: Occasional    No current facility-administered medications for this encounter.  Current outpatient prescriptions:  .  albuterol (PROVENTIL HFA;VENTOLIN HFA) 108 (90 BASE) MCG/ACT inhaler, Inhale 1 puff into the lungs every 6 (six) hours as needed. PRO AIR, Disp: , Rfl:  .  baclofen (LIORESAL) 20 MG tablet, 1.5 tablets in the morning and evening, 1 tablet at mid-day (Patient taking differently: Take 20 mg by mouth 3 (three) times daily. ), Disp: 360 each, Rfl: 1 .  cholecalciferol (VITAMIN D) 1000 UNITS tablet, Take 1,000 Units by mouth daily., Disp: , Rfl:  .  cyanocobalamin (,VITAMIN B-12,) 1000 MCG/ML injection, Inject 1,000 mcg into the muscle every 3 (three) months. , Disp: , Rfl:  .  desloratadine (CLARINEX) 5 MG tablet, Take 1 tablet (5 mg total) by mouth daily., Disp: 30 tablet, Rfl: 0 .  diclofenac sodium (VOLTAREN) 1 % GEL, Apply 2 g topically 4 (four) times daily., Disp: 1 Tube, Rfl: 0 .  diphenhydrAMINE (BENADRYL) 25 MG tablet, Take 25 mg by mouth at bedtime as needed., Disp: , Rfl:  .  EPIPEN 2-PAK 0.3 MG/0.3ML SOAJ injection, USE AS DIRECTED FOR  SEVERE  ALLERGIC  REACTIONS, Disp: 1 Device, Rfl: 0 .  famotidine (PEPCID) 20 MG tablet, Take 20 mg by mouth daily., Disp: , Rfl:  .  FLUoxetine (PROZAC) 10 MG capsule, Take 1 capsule (10 mg total) by mouth daily., Disp: 30 capsule, Rfl: 3 .  Fluticasone-Salmeterol (ADVAIR) 250-50 MCG/DOSE AEPB, Inhale 1 puff into the lungs 2 (two) times daily. , Disp: , Rfl:  .  gabapentin (NEURONTIN) 300 MG capsule, 2 capsules in the morning and evening, one capsule at midday (Patient taking differently: Take 300 mg by mouth 3 (three) times daily. ), Disp: 150 capsule, Rfl: 3 .  levocetirizine (XYZAL) 5 MG tablet, Take 1 tablet (5 mg total) by mouth every evening., Disp: 30 tablet, Rfl: 0 .   loratadine (CLARITIN) 10 MG tablet, Take 10 mg by mouth 2 (two) times daily., Disp: , Rfl:  .  montelukast (SINGULAIR) 10 MG tablet, Take by mouth. , Disp: , Rfl:  .  mupirocin ointment (BACTROBAN) 2 %, Apply 1 application topically 3 (three) times daily. Apply after warm soak for 10 minutes, Disp: 22 g, Rfl: 0  Allergies  Allergen Reactions  . Ibuprofen Other (See Comments)    Does not take due to hx of renal insufficiency  . Tylenol [Acetaminophen] Hives    Cannot take large quantities  . Amoxicillin Diarrhea     ROS  As noted in HPI.   Physical Exam  BP 97/61 mmHg  Pulse 89  Temp(Src) 98.5 F (36.9 C) (Oral)  Resp 12  SpO2 97%   Constitutional: Well developed, well nourished, no acute distress Eyes:  EOMI, conjunctiva normal bilaterally HENT: Normocephalic, atraumatic,mucus membranes moist Respiratory: Normal inspiratory effort Cardiovascular: Normal rate GI: nondistended skin: 3 1 cm nontender areas of erythema left medial lower extremity and 3 identical lesions on the right lower extremity. Calves Otherwise Symmetric, Nontender. No Crusting. No Rash Elsewhere. No Petechiae.  See Pictures. Marked Areas of Erythema with a Marking Pen for Reference.      Musculoskeletal: no deformities Neurologic: Alert & oriented x 3, no focal neuro deficits Psychiatric: Speech and behavior appropriate   ED Course   Medications - No data to display  No orders of the defined types were placed in this encounter.    No results found for this or any previous visit (from the past 24 hour(s)). No results found.  ED Clinical Impression  Insect bites   ED Assessment/Plan  Appears to be a insect bite of some sort. Most likely a localized reaction. May be bed bugs. We'll try Bactroban to help prevent any secondary infection. Do not feel the patient needs systemic antibiotics at this time. She is to continue her antihistamines, cool compresses. Gave patient information on  bedbugs, signs and symptoms that she should look out for. She is to follow-up with her primary care physician as needed. Patient agrees with plan.  *This clinic note was created using Dragon dictation software. Therefore, there may be occasional mistakes despite careful proofreading.  ?    Domenick Gong, MD 07/03/16 [phone]

## 2016-07-03 NOTE — Discharge Instructions (Signed)
While this is more likely an ant or other insect bite, I'm giving you information on bedbugs at that you know what to look for.

## 2016-07-04 DIAGNOSIS — M6281 Muscle weakness (generalized): Secondary | ICD-10-CM | POA: Diagnosis not present

## 2016-07-04 DIAGNOSIS — M545 Low back pain: Secondary | ICD-10-CM | POA: Diagnosis not present

## 2016-07-04 DIAGNOSIS — R2681 Unsteadiness on feet: Secondary | ICD-10-CM | POA: Diagnosis not present

## 2016-07-06 ENCOUNTER — Emergency Department (HOSPITAL_COMMUNITY): Payer: Medicare Other

## 2016-07-06 ENCOUNTER — Emergency Department (HOSPITAL_COMMUNITY)
Admission: EM | Admit: 2016-07-06 | Discharge: 2016-07-07 | Disposition: A | Discharge status: Home / Self Care | Payer: Medicare Other | Attending: Emergency Medicine | Admitting: Emergency Medicine

## 2016-07-06 ENCOUNTER — Encounter (HOSPITAL_COMMUNITY): Payer: Self-pay | Admitting: *Deleted

## 2016-07-06 DIAGNOSIS — S0003XA Contusion of scalp, initial encounter: Secondary | ICD-10-CM | POA: Diagnosis not present

## 2016-07-06 DIAGNOSIS — Y9289 Other specified places as the place of occurrence of the external cause: Secondary | ICD-10-CM | POA: Insufficient documentation

## 2016-07-06 DIAGNOSIS — W0110XA Fall on same level from slipping, tripping and stumbling with subsequent striking against unspecified object, initial encounter: Secondary | ICD-10-CM | POA: Insufficient documentation

## 2016-07-06 DIAGNOSIS — J45909 Unspecified asthma, uncomplicated: Secondary | ICD-10-CM | POA: Diagnosis not present

## 2016-07-06 DIAGNOSIS — M25552 Pain in left hip: Secondary | ICD-10-CM | POA: Diagnosis not present

## 2016-07-06 DIAGNOSIS — S0990XA Unspecified injury of head, initial encounter: Secondary | ICD-10-CM | POA: Diagnosis not present

## 2016-07-06 DIAGNOSIS — S0093XA Contusion of unspecified part of head, initial encounter: Secondary | ICD-10-CM

## 2016-07-06 DIAGNOSIS — S199XXA Unspecified injury of neck, initial encounter: Secondary | ICD-10-CM | POA: Diagnosis not present

## 2016-07-06 DIAGNOSIS — Y999 Unspecified external cause status: Secondary | ICD-10-CM | POA: Diagnosis not present

## 2016-07-06 DIAGNOSIS — Y939 Activity, unspecified: Secondary | ICD-10-CM | POA: Diagnosis not present

## 2016-07-06 DIAGNOSIS — N189 Chronic kidney disease, unspecified: Secondary | ICD-10-CM | POA: Diagnosis not present

## 2016-07-06 NOTE — ED Notes (Signed)
Pt back CT.

## 2016-07-06 NOTE — ED Notes (Signed)
Pt states that she tripped and fell and hit her head. Denies LOC. Pt alert and oriented.

## 2016-07-06 NOTE — ED Provider Notes (Signed)
CSN: [id number]     Arrival date & time 07/06/16  1624 History   First MD Initiated Contact with Patient 07/06/16 2024     Chief Complaint  Patient presents with  . 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 The patient states that she had put her left leg up onto a small step in her bathroom in order to apply Vaseline to her leg when she felt it suddenly stiffen. She says that she was unable to lower the leg and then ended up falling backwards her whole body got stiff and she fell directly on the back of her head. She said that she is worried about the goose egg on her head and wanted to make sure that there was no bleeding associated with it. She has also had some mild neck pain. She reports that she's been having more issues in her left hip area recently. His. No nausea or vomiting.  Patient is a 50 y.o. female presenting with fall.  Fall Associated symptoms include headaches. Pertinent negatives include no chest pain, no abdominal pain and no shortness of breath.    Past Medical History  Diagnosis Date  . Pernicious anemia 10/30/2011  . Lumbar herniated disc   . Environmental allergies   . Angioedema   . DVT (deep venous thrombosis) (HCC)   . Asthma   . Seasonal allergies   . Urticaria   . Chronic back pain   . Anxiety   . Depression   . GERD (gastroesophageal reflux disease)   . Arthritis   . Chronic kidney disease   . Peripheral vascular disease (HCC)   . CAP (community acquired pneumonia) 08/29/2012  . Stiff person syndrome   . Hiatal hernia 03/21/2013  . Cocaine abuse   . Postconcussion syndrome 12/30/2014  . Fall    Past Surgical History  Procedure Laterality Date  . Inguinal hernia 1983  1983  . Colonoscopy  07/09/15   Family History  Problem Relation Age of Onset  . Cancer Mother   . Other Mother   . COPD  Father   . Asthma Brother   . Cancer Brother     colon  . Heart attack Brother   . Seizures Brother    Social History  Substance Use Topics  . Smoking status: Never Smoker   . Smokeless tobacco: Never Used     Comment: Never Used Tobacco  . Alcohol Use: 0.0 oz/week    0 Standard drinks or equivalent per week     Comment: Occasional   OB History    No data available     Review of Systems  Constitutional: Negative for fever, chills and fatigue.  HENT: Negative for congestion, nosebleeds and postnasal drip.   Eyes: Negative for visual disturbance.  Respiratory: Negative for cough, chest tightness and shortness of breath.   Cardiovascular: Negative for chest pain and palpitations.  Gastrointestinal: Negative for nausea, vomiting, abdominal pain and diarrhea.  Genitourinary: Negative for dysuria, urgency and hematuria.  Musculoskeletal: Positive for arthralgias (left hip pain) and neck pain. Negative for myalgias and back pain.  Skin: Negative for rash and wound.  Neurological: Positive for headaches. Negative for dizziness, seizures, syncope, weakness, light-headedness and numbness.  Hematological: Does not bruise/bleed easily.      Allergies  Ibuprofen; Tylenol; and Amoxicillin  Home Medications   Prior to Admission medications   Medication Sig Start Date End Date Taking?  Authorizing Provider  albuterol (PROVENTIL HFA;VENTOLIN HFA) 108 (90 BASE) MCG/ACT inhaler Inhale 1 puff into the lungs every 6 (six) hours as needed. PRO AIR 06/20/13  Yes Waymon Budge, MD  baclofen (LIORESAL) 20 MG tablet Take 30 mg by mouth 2 (two) times daily. Patient states she takes a whole 20mg  tablet then splits it in half to make a total of 30mg    Yes Historical Provider, MD  cholecalciferol (VITAMIN D) 1000 UNITS tablet Take 1,000 Units by mouth daily.   Yes Historical Provider, MD  cyanocobalamin (,VITAMIN B-12,) 1000 MCG/ML injection Inject 1,000 mcg into the muscle every 3 (three) months.     Yes Historical Provider, MD  desloratadine (CLARINEX) 5 MG tablet Take 1 tablet (5 mg total) by mouth daily. 11/06/11  Yes Waymon Budge, MD  diclofenac sodium (VOLTAREN) 1 % GEL Apply 2 g topically 4 (four) times daily. 10/02/14  Yes Ozella Rocks, MD  diphenhydrAMINE (BENADRYL) 25 MG tablet Take 25 mg by mouth at bedtime as needed for itching or allergies.    Yes Historical Provider, MD  EPIPEN 2-PAK 0.3 MG/0.3ML SOAJ injection USE AS DIRECTED FOR  SEVERE  ALLERGIC  REACTIONS 08/16/15  Yes Waymon Budge, MD  FLUoxetine (PROZAC) 10 MG capsule Take 1 capsule (10 mg total) by mouth daily. 05/18/16  Yes York Spaniel, MD  Fluticasone-Salmeterol (ADVAIR) 250-50 MCG/DOSE AEPB Inhale 1 puff into the lungs 2 (two) times daily.  03/14/12  Yes Waymon Budge, MD  gabapentin (NEURONTIN) 300 MG capsule Take 300-600 mg by mouth 3 (three) times daily. Take 600 mg in the morning then take 300 mg in the afternoon, then take 600 mg at bedtime.   Yes Historical Provider, MD  levocetirizine (XYZAL) 5 MG tablet Take 1 tablet (5 mg total) by mouth every evening. 11/06/11  Yes Waymon Budge, MD  lidocaine (LIDODERM) 5 % Place 1 patch onto the skin daily as needed. Remove & Discard patch within 12 hours or as directed by MD. For back pain between the L4 and S1   Yes Historical Provider, MD  loratadine (CLARITIN) 10 MG tablet Take 10 mg by mouth daily.    Yes Historical Provider, MD  Morphine-Naltrexone (EMBEDA PO) Take 1 tablet by mouth at bedtime.    Yes Historical Provider, MD  mupirocin ointment (BACTROBAN) 2 % Apply 1 application topically 3 (three) times daily. Apply after warm soak for 10 minutes 07/03/16  Yes Domenick Gong, MD  baclofen (LIORESAL) 20 MG tablet 1.5 tablets in the morning and evening, 1 tablet at mid-day Patient not taking: Reported on 07/06/2016 01/18/16   York Spaniel, MD  gabapentin (NEURONTIN) 300 MG capsule 2 capsules in the morning and evening, one capsule at midday Patient not  taking: Reported on 07/06/2016 02/15/16   York Spaniel, MD   BP 96/69 mmHg  Pulse 69  Temp(Src) 98.5 F (36.9 C) (Oral)  Resp 16  Ht 5\' 3"  (1.6 m)  Wt 141 lb (63.957 kg)  BMI 24.98 kg/m2  SpO2 98%  LMP 07/29/2012 Physical Exam  Constitutional: She is oriented to person, place, and time. She appears well-developed and well-nourished. No distress.  HENT:  Head: Normocephalic. Head is with contusion.    Right Ear: External ear normal. No hemotympanum.  Left Ear: External ear normal. No hemotympanum.  Nose: Nose normal.  Mouth/Throat: Oropharynx is clear and moist. No oropharyngeal exudate.  Eyes: EOM are normal. Pupils are equal, round, and reactive to light.  Neck:  Normal range of motion. Neck supple.  Cardiovascular: Normal rate, regular rhythm, normal heart sounds and intact distal pulses.   No murmur heard. Pulmonary/Chest: Effort normal. No respiratory distress. She has no wheezes. She has no rales.  Abdominal: Soft. She exhibits no distension. There is no tenderness.  Musculoskeletal: Normal range of motion. She exhibits no edema or tenderness.  No tenderness or pain when joints ranged although reports left hip feels more stiff than usual and was not able to completely lower her leg after I ranged her left hip area no joint swelling or redness. No bony tenderness on exam  Neurological: She is alert and oriented to person, place, and time.  Skin: Skin is warm and dry. No rash noted. She is not diaphoretic.  Vitals reviewed.   ED Course  Procedures (including critical care time) Labs Review Labs Reviewed - No data to display  Imaging Review Ct Head Wo Contrast  07/06/2016  CLINICAL DATA:  50 year old female with fall and trauma to the head. EXAM: CT HEAD WITHOUT CONTRAST CT CERVICAL SPINE WITHOUT CONTRAST TECHNIQUE: Multidetector CT imaging of the head and cervical spine was performed following the standard protocol without intravenous contrast. Multiplanar CT image  reconstructions of the cervical spine were also generated. COMPARISON:  Head CT dated 12/18/2014 FINDINGS: CT HEAD FINDINGS There is minimal age-related atrophy and chronic microvascular ischemic changes. No acute intracranial hemorrhage. No mass effect or midline shift noted. The visualized paranasal sinuses and mastoid air cells are clear. The calvarium is intact. Small posterior vertex scalp contusion. CT CERVICAL SPINE FINDINGS There is no acute fracture or subluxation of the cervical spine.There is mild degenerative changes primarily involving C5-C7 with small posterior osteophytes.The odontoid and spinous processes are intact.There is normal anatomic alignment of the C1-C2 lateral masses. The visualized soft tissues appear unremarkable. IMPRESSION: No acute intracranial pathology. No acute/ traumatic cervical spine pathology. Electronically Signed   By: Elgie CollardArash  Radparvar M.D.   On: 07/06/2016 23:19   Ct Cervical Spine Wo Contrast  07/06/2016  CLINICAL DATA:  50 year old female with fall and trauma to the head. EXAM: CT HEAD WITHOUT CONTRAST CT CERVICAL SPINE WITHOUT CONTRAST TECHNIQUE: Multidetector CT imaging of the head and cervical spine was performed following the standard protocol without intravenous contrast. Multiplanar CT image reconstructions of the cervical spine were also generated. COMPARISON:  Head CT dated 12/18/2014 FINDINGS: CT HEAD FINDINGS There is minimal age-related atrophy and chronic microvascular ischemic changes. No acute intracranial hemorrhage. No mass effect or midline shift noted. The visualized paranasal sinuses and mastoid air cells are clear. The calvarium is intact. Small posterior vertex scalp contusion. CT CERVICAL SPINE FINDINGS There is no acute fracture or subluxation of the cervical spine.There is mild degenerative changes primarily involving C5-C7 with small posterior osteophytes.The odontoid and spinous processes are intact.There is normal anatomic alignment of the  C1-C2 lateral masses. The visualized soft tissues appear unremarkable. IMPRESSION: No acute intracranial pathology. No acute/ traumatic cervical spine pathology. Electronically Signed   By: Elgie CollardArash  Radparvar M.D.   On: 07/06/2016 23:19   Dg Hip Unilat With Pelvis 2-3 Views Left  07/07/2016  CLINICAL DATA:  50 year old female with fall and left hip pain. EXAM: DG HIP (WITH OR WITHOUT PELVIS) 2-3V LEFT COMPARISON:  None. FINDINGS: There is no acute fracture or dislocation. There is mild osteopenia. There is mild osteoarthritic changes of the hips. The soft tissues appear unremarkable. IMPRESSION: No acute fracture or dislocation. Electronically Signed   By: Ceasar MonsArash  Radparvar M.D.  On: 07/07/2016 00:13   I have personally reviewed and evaluated these images and lab results as part of my medical decision-making.   EKG Interpretation None      MDM  Patient was seen and evaluated in stable condition. CT of the head and cervical spine negative for acute process. Left hip x-ray negative for acute process. Results reviewed with patient. She expressed understanding and agreement with plan for discharge. Patient discharged home in stable condition. Final diagnoses:  Head contusion, initial encounter    1. Scalp contusion    Leta Baptist, MD 07/07/16 [phone]

## 2016-07-06 NOTE — ED Notes (Signed)
Pt to CT

## 2016-07-07 NOTE — Discharge Instructions (Signed)
You were seen and evaluated today after your fall.  You have a contusion/bruise on the back of your head.  You can ice this area of your head to help it heal.  Facial or Scalp Contusion A facial or scalp contusion is a deep bruise on the face or head. Injuries to the face and head generally cause a lot of swelling, especially around the eyes. Contusions are the result of an injury that caused bleeding under the skin. The contusion may turn blue, purple, or yellow. Minor injuries will give you a painless contusion, but more severe contusions may stay painful and swollen for a few weeks.  CAUSES  A facial or scalp contusion is caused by a blunt injury or trauma to the face or head area.  SIGNS AND SYMPTOMS   Swelling of the injured area.   Discoloration of the injured area.   Tenderness, soreness, or pain in the injured area.  DIAGNOSIS  The diagnosis can be made by taking a medical history and doing a physical exam. An X-ray exam, CT scan, or MRI may be needed to determine if there are any associated injuries, such as broken bones (fractures). TREATMENT  Often, the best treatment for a facial or scalp contusion is applying cold compresses to the injured area. Over-the-counter medicines may also be recommended for pain control.  HOME CARE INSTRUCTIONS   Only take over-the-counter or prescription medicines as directed by your health care provider.   Apply ice to the injured area.   Put ice in a plastic bag.   Place a towel between your skin and the bag.   Leave the ice on for 20 minutes, 2-3 times a day.  SEEK MEDICAL CARE IF:  You have bite problems.   You have pain with chewing.   You are concerned about facial defects. SEEK IMMEDIATE MEDICAL CARE IF:  You have severe pain or a headache that is not relieved by medicine.   You have unusual sleepiness, confusion, or personality changes.   You throw up (vomit).   You have a persistent nosebleed.   You have  double vision or blurred vision.   You have fluid drainage from your nose or ear.   You have difficulty walking or using your arms or legs.  MAKE SURE YOU:   Understand these instructions.  Will watch your condition.  Will get help right away if you are not doing well or get worse.   This information is not intended to replace advice given to you by your health care provider. Make sure you discuss any questions you have with your health care provider.   Document Released: 01/18/2005 Document Revised: 01/01/2015 Document Reviewed: 07/24/2013 Elsevier Interactive Patient Education 2016 Else[employer] Inc[employer]  Head Injury, Adult You have received a head injury. It does not appear serious at this time. Headaches and vomiting are common following head injury. It should be easy to awaken from sleeping. Sometimes it is necessary for you to stay in the emergency department for a while for observation. Sometimes admission to the hospital may be needed. After injuries such as yours, most problems occur within the first 24 hours, but side effects may occur up to 7-10 days after the injury. It is important for you to carefully monitor your condition and contact your health care provider or seek immediate medical care if there is a change in your condition. WHAT ARE THE TYPES OF HEAD INJURIES? Head injuries can be as minor as a bump. Some head injuries can  be more severe. More severe head injuries include:  A jarring injury to the brain (concussion).  A bruise of the brain (contusion). This mean there is bleeding in the brain that can cause swelling.  A cracked skull (skull fracture).  Bleeding in the brain that collects, clots, and forms a bump (hematoma). WHAT CAUSES A HEAD INJURY? A serious head injury is most likely to happen to someone who is in a car wreck and is not wearing a seat belt. Other causes of major head injuries include bicycle or motorcycle accidents, sports injuries, and falls. HOW  ARE HEAD INJURIES DIAGNOSED? A complete history of the event leading to the injury and your current symptoms will be helpful in diagnosing head injuries. Many times, pictures of the brain, such as CT or MRI are needed to see the extent of the injury. Often, an overnight hospital stay is necessary for observation.  WHEN SHOULD I SEEK IMMEDIATE MEDICAL CARE?  You should get help right away if:  You have confusion or drowsiness.  You feel sick to your stomach (nauseous) or have continued, forceful vomiting.  You have dizziness or unsteadiness that is getting worse.  You have severe, continued headaches not relieved by medicine. Only take over-the-counter or prescription medicines for pain, fever, or discomfort as directed by your health care provider.  You do not have normal function of the arms or legs or are unable to walk.  You notice changes in the black spots in the center of the colored part of your eye (pupil).  You have a clear or bloody fluid coming from your nose or ears.  You have a loss of vision. During the next 24 hours after the injury, you must stay with someone who can watch you for the warning signs. This person should contact local emergency services (911 in the [place] if you have seizures, you become unconscious, or you are unable to wake up. HOW CAN I PREVENT A HEAD INJURY IN THE FUTURE? The most important factor for preventing major head injuries is avoiding motor vehicle accidents. To minimize the potential for damage to your head, it is crucial to wear seat belts while riding in motor vehicles. Wearing helmets while bike riding and playing collision sports (like football) is also helpful. Also, avoiding dangerous activities around the house will further help reduce your risk of head injury.  WHEN CAN I RETURN TO NORMAL ACTIVITIES AND ATHLETICS? You should be reevaluated by your health care provider before returning to these activities. If you have any of the following  symptoms, you should not return to activities or contact sports until 1 week after the symptoms have stopped:  Persistent headache.  Dizziness or vertigo.  Poor attention and concentration.  Confusion.  Memory problems.  Nausea or vomiting.  Fatigue or tire easily.  Irritability.  Intolerant of bright lights or loud noises.  Anxiety or depression.  Disturbed sleep. MAKE SURE YOU:   Understand these instructions.  Will watch your condition.  Will get help right away if you are not doing well or get worse.   This information is not intended to replace advice given to you by your health care provider. Make sure you discuss any questions you have with your health care provider.   Document Released: 12/11/2005 Document Revised: 01/01/2015 Document Reviewed: 08/18/2013 Elsevier Interactive Patient Education [employer]

## 2016-07-13 DIAGNOSIS — W1830XD Fall on same level, unspecified, subsequent encounter: Secondary | ICD-10-CM | POA: Diagnosis not present

## 2016-07-13 DIAGNOSIS — M62838 Other muscle spasm: Secondary | ICD-10-CM | POA: Diagnosis not present

## 2016-07-14 ENCOUNTER — Telehealth: Payer: Self-pay | Admitting: Neurology

## 2016-07-14 MED ORDER — FLUOXETINE HCL 10 MG PO CAPS: 10.0000 mg | ORAL_CAPSULE | Freq: Every day | ORAL | Status: DC

## 2016-07-14 NOTE — Telephone Encounter (Signed)
Patient called requesting to speak to nurse/Dr. Anne HahnWillis about medication change for Baclofen and Gabapentin. Please call [phone].

## 2016-07-14 NOTE — Telephone Encounter (Signed)
Returned call and spoke to pt. Reports that she had a fall last Thursday in her [place] d/t leg stiffness. She has since restarted baclofen and gabapentin as prescribed. Feels that medications have helped. Refills sent in to mail order pharmacy for fluoxetine as requested. Pt continues to participate in PT.

## 2016-07-18 DIAGNOSIS — R2681 Unsteadiness on feet: Secondary | ICD-10-CM | POA: Diagnosis not present

## 2016-07-18 DIAGNOSIS — M6281 Muscle weakness (generalized): Secondary | ICD-10-CM | POA: Diagnosis not present

## 2016-07-18 DIAGNOSIS — M545 Low back pain: Secondary | ICD-10-CM | POA: Diagnosis not present

## 2016-07-20 DIAGNOSIS — R2681 Unsteadiness on feet: Secondary | ICD-10-CM | POA: Diagnosis not present

## 2016-07-20 DIAGNOSIS — M545 Low back pain: Secondary | ICD-10-CM | POA: Diagnosis not present

## 2016-07-20 DIAGNOSIS — M6281 Muscle weakness (generalized): Secondary | ICD-10-CM | POA: Diagnosis not present

## 2016-07-25 DIAGNOSIS — R2681 Unsteadiness on feet: Secondary | ICD-10-CM | POA: Diagnosis not present

## 2016-07-25 DIAGNOSIS — M6281 Muscle weakness (generalized): Secondary | ICD-10-CM | POA: Diagnosis not present

## 2016-07-25 DIAGNOSIS — M545 Low back pain: Secondary | ICD-10-CM | POA: Diagnosis not present

## 2016-07-27 DIAGNOSIS — M545 Low back pain: Secondary | ICD-10-CM | POA: Diagnosis not present

## 2016-07-27 DIAGNOSIS — R2681 Unsteadiness on feet: Secondary | ICD-10-CM | POA: Diagnosis not present

## 2016-07-27 DIAGNOSIS — M6281 Muscle weakness (generalized): Secondary | ICD-10-CM | POA: Diagnosis not present

## 2016-08-01 DIAGNOSIS — M6281 Muscle weakness (generalized): Secondary | ICD-10-CM | POA: Diagnosis not present

## 2016-08-01 DIAGNOSIS — R2681 Unsteadiness on feet: Secondary | ICD-10-CM | POA: Diagnosis not present

## 2016-08-01 DIAGNOSIS — M545 Low back pain: Secondary | ICD-10-CM | POA: Diagnosis not present

## 2016-08-03 DIAGNOSIS — R2681 Unsteadiness on feet: Secondary | ICD-10-CM | POA: Diagnosis not present

## 2016-08-03 DIAGNOSIS — M545 Low back pain: Secondary | ICD-10-CM | POA: Diagnosis not present

## 2016-08-03 DIAGNOSIS — M6281 Muscle weakness (generalized): Secondary | ICD-10-CM | POA: Diagnosis not present

## 2016-08-08 DIAGNOSIS — Z79899 Other long term (current) drug therapy: Secondary | ICD-10-CM | POA: Diagnosis not present

## 2016-08-08 DIAGNOSIS — G894 Chronic pain syndrome: Secondary | ICD-10-CM | POA: Diagnosis not present

## 2016-08-08 DIAGNOSIS — M47817 Spondylosis without myelopathy or radiculopathy, lumbosacral region: Secondary | ICD-10-CM | POA: Diagnosis not present

## 2016-08-08 DIAGNOSIS — Z79891 Long term (current) use of opiate analgesic: Secondary | ICD-10-CM | POA: Diagnosis not present

## 2016-08-08 NOTE — Telephone Encounter (Signed)
C/o extreme weakness and leg spasms this morning. Has had frequent diarrhea over the past couple of months per report. Tried taking imodium per PCP recommendation but still having liquid stools. Says that pain management started her on Embeda recently. She cancelled/rescheduled cortisone injections d/t the leg spasms. Says that she feels dry/thirsty no matter how much water she drinks. Was seen in urgent care and  last month for insect bites and fall. Encouraged pt to continue imodium as needed as well as PO fluids such as Gatorade to help w/ loss of electrolytes.

## 2016-08-08 NOTE — Telephone Encounter (Signed)
Returned call to pt. Says that she does have a call in to her PCP and is waiting on a call back. Also has appt scheduled w/ pain management. Offered to schedule follow-up appt w/ Dr. Anne HahnWillis for gait problems but pt declined at this time stating that she has a balance that she has to pay first. She agreed to get in touch w/ PCP for further recommendations/GI consult. Will continue to drink Gatorade and will call back w/ worsening symptoms.

## 2016-08-08 NOTE — Telephone Encounter (Signed)
Patient called to advise, having spasms again, hasn't had in years, also legs get stiff and won't bend, "it's like they lock", "I start trembling, falling quite a bit". Please call [phone].

## 2016-08-08 NOTE — Telephone Encounter (Signed)
Not sure what else to add, may need to see GI next, after seeing PCP.

## 2016-08-09 DIAGNOSIS — R197 Diarrhea, unspecified: Secondary | ICD-10-CM | POA: Diagnosis not present

## 2016-08-10 DIAGNOSIS — R2681 Unsteadiness on feet: Secondary | ICD-10-CM | POA: Diagnosis not present

## 2016-08-10 DIAGNOSIS — R197 Diarrhea, unspecified: Secondary | ICD-10-CM | POA: Diagnosis not present

## 2016-08-10 DIAGNOSIS — M6281 Muscle weakness (generalized): Secondary | ICD-10-CM | POA: Diagnosis not present

## 2016-08-10 DIAGNOSIS — M545 Low back pain: Secondary | ICD-10-CM | POA: Diagnosis not present

## 2016-08-14 ENCOUNTER — Ambulatory Visit (HOSPITAL_COMMUNITY)
Admission: EM | Admit: 2016-08-14 | Discharge: 2016-08-14 | Disposition: A | Discharge status: Home / Self Care | Payer: Medicare Other | Attending: Family Medicine | Admitting: Family Medicine

## 2016-08-14 ENCOUNTER — Encounter (HOSPITAL_COMMUNITY): Payer: Self-pay | Admitting: Family Medicine

## 2016-08-14 ENCOUNTER — Ambulatory Visit (INDEPENDENT_AMBULATORY_CARE_PROVIDER_SITE_OTHER): Payer: Medicare Other

## 2016-08-14 DIAGNOSIS — S62102A Fracture of unspecified carpal bone, left wrist, initial encounter for closed fracture: Secondary | ICD-10-CM

## 2016-08-14 DIAGNOSIS — S52622A Torus fracture of lower end of left ulna, initial encounter for closed fracture: Secondary | ICD-10-CM | POA: Diagnosis not present

## 2016-08-14 DIAGNOSIS — S52512A Displaced fracture of left radial styloid process, initial encounter for closed fracture: Secondary | ICD-10-CM | POA: Diagnosis not present

## 2016-08-14 MED ORDER — CODEINE SULFATE 30 MG PO TABS: 30.0000 mg | ORAL_TABLET | Freq: Four times a day (QID) | ORAL | 0 refills | Status: DC | PRN

## 2016-08-14 NOTE — Progress Notes (Signed)
Orthopedic Tech Progress Note Patient Details:  Laurena SlimmerSamantha Miley Jan 14, 1966 [id number]  Ortho Devices Type of Ortho Device: Ace wrap, Sugartong splint, Arm sling Ortho Device/Splint Interventions: Application   Saul FordyceJennifer C Orine Goga 08/14/2016, 5:24 PM

## 2016-08-14 NOTE — ED Triage Notes (Signed)
Pt here for left wrist pain and swelling,. sts she injured it today by hitting it against a wall when she fell.

## 2016-08-14 NOTE — ED Provider Notes (Signed)
[facility]    CSN: [id number] Arrival date & time: 08/14/16  1505  First Provider Contact:  First MD Initiated Contact with Patient 08/14/16 1530        History   Chief Complaint Chief Complaint  Patient presents with  . Wrist Injury    HPI Sherri [name] is a 50 y.o. female.   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 There was no loss of consciousness.  Patient grabbed her wrist and straightened it out before coming to the office.    Past Medical History:  Diagnosis Date  . Angioedema   . Anxiety   . Arthritis   . Asthma   . CAP (community acquired pneumonia) 08/29/2012  . Chronic back pain   . Chronic kidney disease   . Cocaine abuse   . Depression   . DVT (deep venous thrombosis) (HCC)   . Environmental allergies   . Fall   . GERD (gastroesophageal reflux disease)   . Hiatal hernia 03/21/2013  . Lumbar herniated disc   . Peripheral vascular disease (HCC)   . Pernicious anemia 10/30/2011  . Postconcussion syndrome 12/30/2014  . Seasonal allergies   . Stiff person syndrome   . Urticaria     Patient Active Problem List   Diagnosis Date Noted  . Postconcussion syndrome 12/30/2014  . Dizziness 12/18/2014  . Tremor 03/11/2014  . 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 Hiatal hernia 03/21/2013  . Anemia, iron deficiency 01/29/2013  . Spasm of muscle 09/02/2012  . Benzodiazepine withdrawal (HCC) 08/30/2012  . CAP (community acquired pneumonia) 08/29/2012  . Hypernatremia 08/28/2012  . Metabolic acidosis 08/28/2012  . Leukocytosis 08/28/2012  . UTI (lower urinary tract infection) 08/28/2012  . Anemia 08/28/2012  . Acute back pain 08/27/2012  . Rhabdomyolysis 08/27/2012  . ARF (acute renal failure) (HCC) 08/27/2012  . Hypokalemia 08/27/2012  . Asthma 08/27/2012  . Chronic back pain   . Pernicious anemia 10/30/2011  .  DEGENERATIVE DISC DISEASE 08/18/2010  . DEEP VEIN THROMBOSIS/PHLEBITIS 07/28/2010  . Asthma with bronchitis 07/28/2010  . URTICARIA 07/28/2010    Past Surgical History:  Procedure Laterality Date  . colonoscopy  07/09/15  . inguinal hernia 1983  1983    OB History    No data available       Home Medications    Prior to Admission medications   Medication Sig Start Date End Date Taking? Authorizing Provider  albuterol (PROVENTIL HFA;VENTOLIN HFA) 108 (90 BASE) MCG/ACT inhaler Inhale 1 puff into the lungs every 6 (six) hours as needed. PRO AIR 06/20/13   Waymon Budgelinton D Young, MD  baclofen (LIORESAL) 20 MG tablet 1.5 tablets in the morning and evening, 1 tablet at mid-day Patient not taking: Reported on 07/06/2016 01/18/16   York Spanielharles K Willis, MD  baclofen (LIORESAL) 20 MG tablet Take 30 mg by mouth 2 (two) times daily. Patient states she takes a whole 20mg  tablet then splits it in half to make a total of 30mg     Historical Provider, MD  cholecalciferol (VITAMIN D) 1000 UNITS tablet Take 1,000 Units by mouth daily.    Historical Provider, MD  codeine 30 MG tablet Take 1 tablet (30 mg total) by mouth every 6 (six) hours as needed. 08/14/16   Elvina SidleKurt Reha Martinovich, MD  cyanocobalamin (,VITAMIN B-12,) 1000 MCG/ML injection Inject 1,000 mcg into the muscle every 3 (three) months.  Historical Provider, MD  desloratadine (CLARINEX) 5 MG tablet Take 1 tablet (5 mg total) by mouth daily. 11/06/11   Waymon Budgelinton D Young, MD  diclofenac sodium (VOLTAREN) 1 % GEL Apply 2 g topically 4 (four) times daily. 10/02/14   Ozella Rocksavid J Merrell, MD  diphenhydrAMINE (BENADRYL) 25 MG tablet Take 25 mg by mouth at bedtime as needed for itching or allergies.     Historical Provider, MD  EPIPEN 2-PAK 0.3 MG/0.3ML SOAJ injection USE AS DIRECTED FOR  SEVERE  ALLERGIC  REACTIONS 08/16/15   Waymon Budgelinton D Young, MD  Fluticasone-Salmeterol (ADVAIR) 250-50 MCG/DOSE AEPB Inhale 1 puff into the lungs 2 (two) times daily.  03/14/12   Waymon Budgelinton D Young,  MD  gabapentin (NEURONTIN) 300 MG capsule 2 capsules in the morning and evening, one capsule at midday Patient not taking: Reported on 07/06/2016 02/15/16   York Spanielharles K Willis, MD  gabapentin (NEURONTIN) 300 MG capsule Take 300-600 mg by mouth 3 (three) times daily. Take 600 mg in the morning then take 300 mg in the afternoon, then take 600 mg at bedtime.    Historical Provider, MD  levocetirizine (XYZAL) 5 MG tablet Take 1 tablet (5 mg total) by mouth every evening. 11/06/11   Waymon Budgelinton D Young, MD  loratadine (CLARITIN) 10 MG tablet Take 10 mg by mouth daily.     Historical Provider, MD    Family History Family History  Problem Relation Age of Onset  . Cancer Mother   . Other Mother   . COPD Father   . Asthma Brother   . Cancer Brother     colon  . Heart attack Brother   . Seizures Brother     Social History Social History  Substance Use Topics  . Smoking status: Never Smoker  . Smokeless tobacco: Never Used     Comment: Never Used Tobacco  . Alcohol use 0.0 oz/week     Comment: Occasional     Allergies   Ibuprofen; Tylenol [acetaminophen]; and Amoxicillin   Review of Systems Review of Systems  Constitutional: Negative.   HENT: Negative.   Eyes:       Patient has amblyopia  Respiratory: Negative.   Cardiovascular: Negative.   Gastrointestinal: Negative.   Musculoskeletal: Positive for joint swelling.     Physical Exam Triage Vital Signs ED Triage Vitals  Enc Vitals Group     BP 08/14/16 1513 143/90     Pulse Rate 08/14/16 1513 (!) 57     Resp 08/14/16 1513 18     Temp 08/14/16 1513 98.1 F (36.7 C)     Temp Source 08/14/16 1513 Oral     SpO2 08/14/16 1513 99 %     Weight --      Height --      Head Circumference --      Peak Flow --      Pain Score 08/14/16 1515 9     Pain Loc --      Pain Edu? --      Excl. in GC? --    No data found.   Updated Vital Signs BP 143/90   Pulse (!) 57   Temp 98.1 F (36.7 C) (Oral)   Resp 18   LMP 07/29/2012    SpO2 99%   Visual Acuity Right Eye Distance:   Left Eye Distance:   Bilateral Distance:    Right Eye Near:   Left Eye Near:    Bilateral Near:     Physical Exam  Constitutional:  She is oriented to person, place, and time. She appears well-developed and well-nourished.  HENT:  1 cm area of swelling and erythema on left forehead at the hairline  Eyes: Conjunctivae are normal. Pupils are equal, round, and reactive to light.  Amblyopia with ESophoria on the left  Neck: Neck supple.  Abdominal: Soft.  Musculoskeletal:  Swollen and tender left wrist over the radius and ulna  Neurological: She is alert and oriented to person, place, and time.  Nursing note and vitals reviewed.    UC Treatments / Results  Labs (all labs ordered are listed, but only abnormal results are displayed) Labs Reviewed - No data to display  EKG  EKG Interpretation None       Radiology Dg Wrist Complete Left  Result Date: 08/14/2016 CLINICAL DATA:  Left wrist pain after falling today in her bedroom. EXAM: LEFT WRIST - COMPLETE 3+ VIEW COMPARISON:  None. FINDINGS: Mildly comminuted and impacted fracture of the distal radial metaphysis with dorsal angulation of the distal fragment. No significant displacement. There is also an ulnar styloid fracture with radial displacement of the distal fragment. Associated soft tissue swelling. IMPRESSION: Distal radius and ulnar styloid fractures. Electronically Signed   By: Beckie Salts M.D.   On: 08/14/2016 15:45    Procedures Procedures (including critical care time)  Medications Ordered in UC Medications - No data to display   Initial Impression / Assessment and Plan / UC Course  I have reviewed the triage vital signs and the nursing notes.  Pertinent labs & imaging results that were available during my care of the patient were reviewed by me and considered in my medical decision making (see chart for details).  Clinical Course   Patient provided a sugar  tong splint for the left arm   Final Clinical Impressions(s) / UC Diagnoses   Final diagnoses:  Left wrist fracture, closed, initial encounter    New Prescriptions New Prescriptions   CODEINE 30 MG TABLET    Take 1 tablet (30 mg total) by mouth every 6 (six) hours as needed.     Elvina Sidle, MD 08/14/16 [phone]

## 2016-08-15 ENCOUNTER — Encounter (HOSPITAL_COMMUNITY): Payer: Self-pay | Admitting: *Deleted

## 2016-08-15 ENCOUNTER — Other Ambulatory Visit: Payer: Self-pay | Admitting: Orthopaedic Surgery

## 2016-08-15 DIAGNOSIS — S52532A Colles' fracture of left radius, initial encounter for closed fracture: Secondary | ICD-10-CM | POA: Diagnosis not present

## 2016-08-15 NOTE — Progress Notes (Signed)
Spoke with pt for pre-op call. Pt denies cardiac history, chest pain or sob. 

## 2016-08-16 ENCOUNTER — Observation Stay (HOSPITAL_COMMUNITY)
Admission: RE | Admit: 2016-08-16 | Discharge: 2016-08-17 | Disposition: A | Discharge status: Home / Self Care | Payer: Medicare Other | Source: Ambulatory Visit | Attending: Orthopaedic Surgery | Admitting: Orthopaedic Surgery

## 2016-08-16 ENCOUNTER — Encounter (HOSPITAL_COMMUNITY): Payer: Self-pay | Admitting: *Deleted

## 2016-08-16 ENCOUNTER — Encounter (HOSPITAL_COMMUNITY)
Admission: RE | Disposition: A | Discharge status: Home / Self Care | Payer: Self-pay | Source: Ambulatory Visit | Attending: Orthopaedic Surgery

## 2016-08-16 ENCOUNTER — Ambulatory Visit (HOSPITAL_COMMUNITY): Payer: Medicare Other | Admitting: Anesthesiology

## 2016-08-16 DIAGNOSIS — S52501A Unspecified fracture of the lower end of right radius, initial encounter for closed fracture: Secondary | ICD-10-CM | POA: Diagnosis present

## 2016-08-16 DIAGNOSIS — M549 Dorsalgia, unspecified: Secondary | ICD-10-CM | POA: Diagnosis not present

## 2016-08-16 DIAGNOSIS — S59202A Unspecified physeal fracture of lower end of radius, left arm, initial encounter for closed fracture: Principal | ICD-10-CM | POA: Insufficient documentation

## 2016-08-16 DIAGNOSIS — N189 Chronic kidney disease, unspecified: Secondary | ICD-10-CM | POA: Insufficient documentation

## 2016-08-16 DIAGNOSIS — Z7951 Long term (current) use of inhaled steroids: Secondary | ICD-10-CM | POA: Insufficient documentation

## 2016-08-16 DIAGNOSIS — F329 Major depressive disorder, single episode, unspecified: Secondary | ICD-10-CM | POA: Diagnosis not present

## 2016-08-16 DIAGNOSIS — J45909 Unspecified asthma, uncomplicated: Secondary | ICD-10-CM | POA: Diagnosis not present

## 2016-08-16 DIAGNOSIS — Z79899 Other long term (current) drug therapy: Secondary | ICD-10-CM | POA: Diagnosis not present

## 2016-08-16 DIAGNOSIS — W2201XA Walked into wall, initial encounter: Secondary | ICD-10-CM | POA: Insufficient documentation

## 2016-08-16 DIAGNOSIS — G2582 Stiff-man syndrome: Secondary | ICD-10-CM | POA: Diagnosis not present

## 2016-08-16 DIAGNOSIS — G8929 Other chronic pain: Secondary | ICD-10-CM | POA: Insufficient documentation

## 2016-08-16 DIAGNOSIS — S52612A Displaced fracture of left ulna styloid process, initial encounter for closed fracture: Secondary | ICD-10-CM | POA: Diagnosis not present

## 2016-08-16 DIAGNOSIS — Z86718 Personal history of other venous thrombosis and embolism: Secondary | ICD-10-CM | POA: Diagnosis not present

## 2016-08-16 DIAGNOSIS — S52532A Colles' fracture of left radius, initial encounter for closed fracture: Secondary | ICD-10-CM | POA: Diagnosis not present

## 2016-08-16 HISTORY — PX: OPEN REDUCTION INTERNAL FIXATION (ORIF) DISTAL RADIAL FRACTURE: SHX5989

## 2016-08-16 HISTORY — DX: Cardiac murmur, unspecified: R01.1

## 2016-08-16 HISTORY — DX: Diarrhea, unspecified: R19.7

## 2016-08-16 LAB — COMPREHENSIVE METABOLIC PANEL WITH GFR
ALT: 12 U/L — ABNORMAL LOW (ref 14–54)
AST: 15 U/L (ref 15–41)
Albumin: 3.8 g/dL (ref 3.5–5.0)
Alkaline Phosphatase: 96 U/L (ref 38–126)
Anion gap: 9 (ref 5–15)
BUN: 9 mg/dL (ref 6–20)
CO2: 26 mmol/L (ref 22–32)
Calcium: 9.6 mg/dL (ref 8.9–10.3)
Chloride: 105 mmol/L (ref 101–111)
Creatinine, Ser: 1.13 mg/dL — ABNORMAL HIGH (ref 0.44–1.00)
GFR calc Af Amer: >60 mL/min (ref >60)
GFR calc non Af Amer: 55 mL/min — ABNORMAL LOW (ref >60)
Glucose, Bld: 122 mg/dL — ABNORMAL HIGH (ref 65–99)
Potassium: 3.7 mmol/L (ref 3.5–5.1)
Sodium: 140 mmol/L (ref 135–145)
Total Bilirubin: 0.6 mg/dL (ref 0.3–1.2)
Total Protein: 7 g/dL (ref 6.5–8.1)

## 2016-08-16 LAB — CBC
HCT: 35.1 % — ABNORMAL LOW (ref 36.0–46.0)
Hemoglobin: 11.3 g/dL — ABNORMAL LOW (ref 12.0–15.0)
MCH: 27.6 pg (ref 26.0–34.0)
MCHC: 32.2 g/dL (ref 30.0–36.0)
MCV: 85.6 fL (ref 78.0–100.0)
Platelets: 265 10*3/uL (ref 150–400)
RBC: 4.1 MIL/uL (ref 3.87–5.11)
RDW: 13.2 % (ref 11.5–15.5)
WBC: 6.9 10*3/uL (ref 4.0–10.5)

## 2016-08-16 SURGERY — OPEN REDUCTION INTERNAL FIXATION (ORIF) DISTAL RADIUS FRACTURE
Anesthesia: General | Laterality: Right

## 2016-08-16 MED ORDER — GABAPENTIN 300 MG PO CAPS
600.0000 mg | ORAL_CAPSULE | Freq: Two times a day (BID) | ORAL | Status: DC
  Administered 2016-08-16 – 2016-08-17 (×2): 600 mg via ORAL
  Filled 2016-08-16 (×2): qty 2

## 2016-08-16 MED ORDER — ALBUTEROL SULFATE (2.5 MG/3ML) 0.083% IN NEBU
2.5000 mg | INHALATION_SOLUTION | Freq: Two times a day (BID) | RESPIRATORY_TRACT | Status: DC
  Administered 2016-08-17: 2.5 mg via RESPIRATORY_TRACT
  Filled 2016-08-16: qty 3

## 2016-08-16 MED ORDER — ALBUTEROL SULFATE (2.5 MG/3ML) 0.083% IN NEBU: 2.5000 mg | INHALATION_SOLUTION | Freq: Four times a day (QID) | RESPIRATORY_TRACT | Status: DC

## 2016-08-16 MED ORDER — OXYCODONE HCL 5 MG PO TABS
5.0000 mg | ORAL_TABLET | ORAL | Status: DC | PRN
  Administered 2016-08-17 (×3): 5 mg via ORAL
  Filled 2016-08-16 (×3): qty 1

## 2016-08-16 MED ORDER — GABAPENTIN 300 MG PO CAPS
300.0000 mg | ORAL_CAPSULE | Freq: Every day | ORAL | Status: DC
  Administered 2016-08-17: 300 mg via ORAL
  Filled 2016-08-16: qty 1

## 2016-08-16 MED ORDER — MIDAZOLAM HCL 2 MG/2ML IJ SOLN
INTRAMUSCULAR | Status: AC
  Filled 2016-08-16: qty 2

## 2016-08-16 MED ORDER — GLYCOPYRROLATE 0.2 MG/ML IV SOSY
PREFILLED_SYRINGE | INTRAVENOUS | Status: AC
  Filled 2016-08-16: qty 3

## 2016-08-16 MED ORDER — BUPIVACAINE-EPINEPHRINE (PF) 0.5% -1:200000 IJ SOLN
INTRAMUSCULAR | Status: DC | PRN
  Administered 2016-08-16: 30 mL via PERINEURAL

## 2016-08-16 MED ORDER — CETIRIZINE HCL 10 MG PO TABS
5.0000 mg | ORAL_TABLET | Freq: Every evening | ORAL | Status: DC
  Administered 2016-08-16: 5 mg via ORAL
  Filled 2016-08-16 (×2): qty 1

## 2016-08-16 MED ORDER — SUCCINYLCHOLINE CHLORIDE 200 MG/10ML IV SOSY
PREFILLED_SYRINGE | INTRAVENOUS | Status: AC
  Filled 2016-08-16: qty 10

## 2016-08-16 MED ORDER — ONDANSETRON HCL 4 MG/2ML IJ SOLN
INTRAMUSCULAR | Status: DC | PRN
  Administered 2016-08-16: 4 mg via INTRAVENOUS

## 2016-08-16 MED ORDER — SUCCINYLCHOLINE CHLORIDE 200 MG/10ML IV SOSY
PREFILLED_SYRINGE | INTRAVENOUS | Status: AC
  Filled 2016-08-16: qty 20

## 2016-08-16 MED ORDER — GLYCOPYRROLATE 0.2 MG/ML IJ SOLN
INTRAMUSCULAR | Status: DC | PRN
  Administered 2016-08-16: 0.2 mg via INTRAVENOUS

## 2016-08-16 MED ORDER — CLINDAMYCIN PHOSPHATE 900 MG/50ML IV SOLN
INTRAVENOUS | Status: AC
  Filled 2016-08-16: qty 50

## 2016-08-16 MED ORDER — EPHEDRINE 5 MG/ML INJ
INTRAVENOUS | Status: AC
  Filled 2016-08-16: qty 20

## 2016-08-16 MED ORDER — PHENYLEPHRINE HCL 10 MG/ML IJ SOLN
INTRAVENOUS | Status: DC | PRN
  Administered 2016-08-16: 25 ug/min via INTRAVENOUS

## 2016-08-16 MED ORDER — HYDROMORPHONE HCL 1 MG/ML IJ SOLN: 0.5000 mg | INTRAMUSCULAR | Status: DC | PRN

## 2016-08-16 MED ORDER — ONDANSETRON HCL 4 MG/2ML IJ SOLN: INTRAMUSCULAR | Status: DC | PRN

## 2016-08-16 MED ORDER — FENTANYL CITRATE (PF) 100 MCG/2ML IJ SOLN
INTRAMUSCULAR | Status: DC | PRN
  Administered 2016-08-16 (×3): 50 ug via INTRAVENOUS

## 2016-08-16 MED ORDER — DEXAMETHASONE SODIUM PHOSPHATE 10 MG/ML IJ SOLN
INTRAMUSCULAR | Status: DC | PRN
  Administered 2016-08-16: 10 mg via INTRAVENOUS

## 2016-08-16 MED ORDER — ALBUTEROL SULFATE HFA 108 (90 BASE) MCG/ACT IN AERS: 1.0000 | INHALATION_SPRAY | Freq: Four times a day (QID) | RESPIRATORY_TRACT | Status: DC

## 2016-08-16 MED ORDER — FENTANYL CITRATE (PF) 100 MCG/2ML IJ SOLN
INTRAMUSCULAR | Status: AC
  Filled 2016-08-16: qty 4

## 2016-08-16 MED ORDER — FENTANYL CITRATE (PF) 100 MCG/2ML IJ SOLN
INTRAMUSCULAR | Status: AC
  Filled 2016-08-16: qty 2

## 2016-08-16 MED ORDER — LIDOCAINE 2% (20 MG/ML) 5 ML SYRINGE
INTRAMUSCULAR | Status: AC
  Filled 2016-08-16: qty 5

## 2016-08-16 MED ORDER — BACLOFEN 10 MG PO TABS
30.0000 mg | ORAL_TABLET | Freq: Two times a day (BID) | ORAL | Status: DC
  Administered 2016-08-16 – 2016-08-17 (×2): 30 mg via ORAL
  Filled 2016-08-16 (×2): qty 3

## 2016-08-16 MED ORDER — LACTATED RINGERS IV SOLN
INTRAVENOUS | Status: DC | PRN
  Administered 2016-08-16: 14:00:00 via INTRAVENOUS

## 2016-08-16 MED ORDER — FLUOXETINE HCL 10 MG PO CAPS
10.0000 mg | ORAL_CAPSULE | Freq: Every day | ORAL | Status: DC
  Administered 2016-08-16: 10 mg via ORAL
  Filled 2016-08-16 (×2): qty 1

## 2016-08-16 MED ORDER — CEFAZOLIN IN D5W 1 GM/50ML IV SOLN
1.0000 g | Freq: Four times a day (QID) | INTRAVENOUS | Status: AC
  Administered 2016-08-16 – 2016-08-17 (×2): 1 g via INTRAVENOUS
  Filled 2016-08-16 (×2): qty 50

## 2016-08-16 MED ORDER — EPINEPHRINE 0.3 MG/0.3ML IJ SOAJ: 0.3000 mg | Freq: Once | INTRAMUSCULAR | Status: DC

## 2016-08-16 MED ORDER — MONTELUKAST SODIUM 10 MG PO TABS
10.0000 mg | ORAL_TABLET | Freq: Every day | ORAL | Status: DC
  Administered 2016-08-16: 10 mg via ORAL
  Filled 2016-08-16: qty 1

## 2016-08-16 MED ORDER — MIDAZOLAM HCL 5 MG/5ML IJ SOLN
INTRAMUSCULAR | Status: DC | PRN
  Administered 2016-08-16: 2 mg via INTRAVENOUS

## 2016-08-16 MED ORDER — ALBUTEROL SULFATE (2.5 MG/3ML) 0.083% IN NEBU
2.5000 mg | INHALATION_SOLUTION | Freq: Four times a day (QID) | RESPIRATORY_TRACT | Status: DC
  Administered 2016-08-16: 2.5 mg via RESPIRATORY_TRACT
  Filled 2016-08-16: qty 3

## 2016-08-16 MED ORDER — PROPOFOL 10 MG/ML IV BOLUS
INTRAVENOUS | Status: AC
  Filled 2016-08-16: qty 20

## 2016-08-16 MED ORDER — LORATADINE 10 MG PO TABS
10.0000 mg | ORAL_TABLET | Freq: Every day | ORAL | Status: DC
  Administered 2016-08-17: 10 mg via ORAL
  Filled 2016-08-16: qty 1

## 2016-08-16 MED ORDER — METOCLOPRAMIDE HCL 5 MG PO TABS: 5.0000 mg | ORAL_TABLET | Freq: Three times a day (TID) | ORAL | Status: DC | PRN

## 2016-08-16 MED ORDER — CHLORHEXIDINE GLUCONATE 4 % EX LIQD: 60.0000 mL | Freq: Once | CUTANEOUS | Status: DC

## 2016-08-16 MED ORDER — EPHEDRINE SULFATE-NACL 50-0.9 MG/10ML-% IV SOSY
PREFILLED_SYRINGE | INTRAVENOUS | Status: DC | PRN
  Administered 2016-08-16 (×3): 5 mg via INTRAVENOUS

## 2016-08-16 MED ORDER — METOCLOPRAMIDE HCL 5 MG/ML IJ SOLN: 5.0000 mg | Freq: Three times a day (TID) | INTRAMUSCULAR | Status: DC | PRN

## 2016-08-16 MED ORDER — CLINDAMYCIN PHOSPHATE 900 MG/50ML IV SOLN
900.0000 mg | INTRAVENOUS | Status: AC
  Administered 2016-08-16: 900 mg via INTRAVENOUS

## 2016-08-16 MED ORDER — ONDANSETRON HCL 4 MG/2ML IJ SOLN: 4.0000 mg | Freq: Four times a day (QID) | INTRAMUSCULAR | Status: DC | PRN

## 2016-08-16 MED ORDER — CEFAZOLIN SODIUM 1 G IJ SOLR
INTRAMUSCULAR | Status: AC
  Filled 2016-08-16: qty 10

## 2016-08-16 MED ORDER — PHENYLEPHRINE 40 MCG/ML (10ML) SYRINGE FOR IV PUSH (FOR BLOOD PRESSURE SUPPORT)
PREFILLED_SYRINGE | INTRAVENOUS | Status: DC | PRN
  Administered 2016-08-16: 80 ug via INTRAVENOUS
  Administered 2016-08-16: 40 ug via INTRAVENOUS
  Administered 2016-08-16: 80 ug via INTRAVENOUS

## 2016-08-16 MED ORDER — BACLOFEN 10 MG PO TABS
20.0000 mg | ORAL_TABLET | Freq: Every day | ORAL | Status: DC
  Administered 2016-08-17: 20 mg via ORAL
  Filled 2016-08-16 (×2): qty 2

## 2016-08-16 MED ORDER — SUCCINYLCHOLINE CHLORIDE 200 MG/10ML IV SOSY
PREFILLED_SYRINGE | INTRAVENOUS | Status: DC | PRN
  Administered 2016-08-16: 60 mg via INTRAVENOUS

## 2016-08-16 MED ORDER — ONDANSETRON HCL 4 MG/2ML IJ SOLN
INTRAMUSCULAR | Status: AC
  Filled 2016-08-16: qty 2

## 2016-08-16 MED ORDER — MOMETASONE FURO-FORMOTEROL FUM 200-5 MCG/ACT IN AERO
1.0000 | INHALATION_SPRAY | Freq: Two times a day (BID) | RESPIRATORY_TRACT | Status: DC
  Administered 2016-08-16 – 2016-08-17 (×2): 1 via RESPIRATORY_TRACT
  Filled 2016-08-16: qty 8.8

## 2016-08-16 MED ORDER — ONDANSETRON HCL 4 MG/2ML IJ SOLN
INTRAMUSCULAR | Status: AC
  Filled 2016-08-16: qty 4

## 2016-08-16 MED ORDER — SODIUM CHLORIDE 0.45 % IV SOLN
INTRAVENOUS | Status: DC
  Administered 2016-08-16: 19:00:00 via INTRAVENOUS

## 2016-08-16 MED ORDER — ONDANSETRON HCL 4 MG PO TABS: 4.0000 mg | ORAL_TABLET | Freq: Four times a day (QID) | ORAL | Status: DC | PRN

## 2016-08-16 MED ORDER — PROPOFOL 10 MG/ML IV BOLUS
INTRAVENOUS | Status: DC | PRN
  Administered 2016-08-16: 150 mg via INTRAVENOUS
  Administered 2016-08-16: 50 mg via INTRAVENOUS

## 2016-08-16 MED ORDER — 0.9 % SODIUM CHLORIDE (POUR BTL) OPTIME
TOPICAL | Status: DC | PRN
  Administered 2016-08-16: 1000 mL

## 2016-08-16 MED ORDER — LACTATED RINGERS IV SOLN
INTRAVENOUS | Status: DC
  Administered 2016-08-16: 12:00:00 via INTRAVENOUS

## 2016-08-16 MED ORDER — LIDOCAINE 2% (20 MG/ML) 5 ML SYRINGE
INTRAMUSCULAR | Status: DC | PRN
  Administered 2016-08-16: 40 mg via INTRAVENOUS

## 2016-08-16 MED ORDER — ROCURONIUM BROMIDE 10 MG/ML (PF) SYRINGE
PREFILLED_SYRINGE | INTRAVENOUS | Status: AC
  Filled 2016-08-16: qty 10

## 2016-08-16 MED ORDER — DEXAMETHASONE SODIUM PHOSPHATE 10 MG/ML IJ SOLN
INTRAMUSCULAR | Status: AC
  Filled 2016-08-16: qty 1

## 2016-08-16 SURGICAL SUPPLY — 59 items
BANDAGE ELASTIC 4 VELCRO ST LF (GAUZE/BANDAGES/DRESSINGS) ×2 IMPLANT
BIT DRILL 2 FAST STEP (BIT) ×1 IMPLANT
BIT DRILL 2.5X4 QC (BIT) ×1 IMPLANT
BNDG CMPR 9X4 STRL LF SNTH (GAUZE/BANDAGES/DRESSINGS) ×1
BNDG ESMARK 4X9 LF (GAUZE/BANDAGES/DRESSINGS) ×1 IMPLANT
CORDS BIPOLAR (ELECTRODE) ×2 IMPLANT
COVER SURGICAL LIGHT HANDLE (MISCELLANEOUS) ×2 IMPLANT
DRAPE OEC MINIVIEW 54X84 (DRAPES) ×1 IMPLANT
DRSG PAD ABDOMINAL 8X10 ST (GAUZE/BANDAGES/DRESSINGS) IMPLANT
DURAPREP 26ML APPLICATOR (WOUND CARE) ×2 IMPLANT
ELECT REM PT RETURN 9FT ADLT (ELECTROSURGICAL) ×2
ELECTRODE REM PT RTRN 9FT ADLT (ELECTROSURGICAL) ×1 IMPLANT
GAUZE SPONGE 4X4 12PLY STRL (GAUZE/BANDAGES/DRESSINGS) IMPLANT
GAUZE XEROFORM 1X8 LF (GAUZE/BANDAGES/DRESSINGS) IMPLANT
GLOVE BIOGEL PI IND STRL 8 (GLOVE) ×2 IMPLANT
GLOVE BIOGEL PI INDICATOR 8 (GLOVE) ×2
GLOVE ORTHO TXT STRL SZ7.5 (GLOVE) ×4 IMPLANT
GOWN STRL REUS W/ TWL LRG LVL3 (GOWN DISPOSABLE) ×1 IMPLANT
GOWN STRL REUS W/ TWL XL LVL3 (GOWN DISPOSABLE) ×1 IMPLANT
GOWN STRL REUS W/TWL 2XL LVL3 (GOWN DISPOSABLE) ×2 IMPLANT
GOWN STRL REUS W/TWL LRG LVL3 (GOWN DISPOSABLE) ×2
GOWN STRL REUS W/TWL XL LVL3 (GOWN DISPOSABLE) ×2
K-WIRE 1.4X100 (WIRE)
K-WIRE 1.6 (WIRE) ×2
K-WIRE FX5X1.6XNS BN SS (WIRE) ×1
KIT BASIN OR (CUSTOM PROCEDURE TRAY) ×2 IMPLANT
KIT ROOM TURNOVER OR (KITS) ×2 IMPLANT
KWIRE 1.4X100 (WIRE) IMPLANT
KWIRE FX5X1.6XNS BN SS (WIRE) IMPLANT
MANIFOLD NEPTUNE II (INSTRUMENTS) ×2 IMPLANT
NEEDLE 22X1 1/2 (OR ONLY) (NEEDLE) IMPLANT
NS IRRIG 1000ML POUR BTL (IV SOLUTION) ×2 IMPLANT
PACK ORTHO EXTREMITY (CUSTOM PROCEDURE TRAY) ×2 IMPLANT
PAD ARMBOARD 7.5X6 YLW CONV (MISCELLANEOUS) ×4 IMPLANT
PAD CAST 4YDX4 CTTN HI CHSV (CAST SUPPLIES) IMPLANT
PADDING CAST COTTON 4X4 STRL (CAST SUPPLIES) ×4
PEG SUBCHONDRAL SMOOTH 2.0X18 (Peg) ×3 IMPLANT
PEG SUBCHONDRAL SMOOTH 2.0X20 (Peg) ×3 IMPLANT
PEG SUBCHONDRAL SMOOTH 2.0X22 (Peg) ×1 IMPLANT
PLATE SHORT 21.6X48.9 NRRW LT (Plate) ×1 IMPLANT
SCREW CORT 3.5X14 LNG (Screw) ×2 IMPLANT
SCREW MULTI DIRECT 20MM (Screw) ×1 IMPLANT
SPONGE LAP 4X18 X RAY DECT (DISPOSABLE) ×4 IMPLANT
STAPLER VISISTAT 35W (STAPLE) ×2 IMPLANT
STRIP CLOSURE SKIN 1/2X4 (GAUZE/BANDAGES/DRESSINGS) ×2 IMPLANT
SUCTION FRAZIER HANDLE 10FR (MISCELLANEOUS) ×1
SUCTION TUBE FRAZIER 10FR DISP (MISCELLANEOUS) ×1 IMPLANT
SUT PROLENE 3 0 PS 1 (SUTURE) ×2 IMPLANT
SUT VIC AB 2-0 CT1 27 (SUTURE)
SUT VIC AB 2-0 CT1 TAPERPNT 27 (SUTURE) IMPLANT
SUT VIC AB 3-0 X1 27 (SUTURE) ×2 IMPLANT
SUT VICRYL 4-0 PS2 18IN ABS (SUTURE) IMPLANT
SYR CONTROL 10ML LL (SYRINGE) IMPLANT
TOWEL OR 17X24 6PK STRL BLUE (TOWEL DISPOSABLE) ×2 IMPLANT
TOWEL OR 17X26 10 PK STRL BLUE (TOWEL DISPOSABLE) ×2 IMPLANT
TUBE CONNECTING 12X1/4 (SUCTIONS) ×2 IMPLANT
UNDERPAD 30X30 INCONTINENT (UNDERPADS AND DIAPERS) ×2 IMPLANT
WATER STERILE IRR 1000ML POUR (IV SOLUTION) ×1 IMPLANT
YANKAUER SUCT BULB TIP NO VENT (SUCTIONS) ×2 IMPLANT

## 2016-08-16 NOTE — Interval H&P Note (Signed)
History and Physical Interval Note:  08/16/2016 3:04 PM  Sherri [name]  has presented today for surgery, with the diagnosis of Left Distal Radius Fracture  The various methods of treatment have been discussed with the patient and family. After consideration of risks, benefits and other options for treatment, the patient has consented to  Procedure(s): OPEN REDUCTION INTERNAL FIXATION (ORIF) DISTAL RADIAL FRACTURE (Right) as a surgical intervention .  The patient's history has been reviewed, patient examined, no change in status, stable for surgery.  I have reviewed the patient's chart and labs.  Questions were answered to the patient's satisfaction.     Havard Radigan C

## 2016-08-16 NOTE — Anesthesia Procedure Notes (Signed)
Anesthesia Regional Block:  Supraclavicular block  Pre-Anesthetic Checklist: ,, timeout performed, Correct Patient, Correct Site, Correct Laterality, Correct Procedure, Correct Position, site marked, Risks and benefits discussed,  Surgical consent,  Pre-op evaluation,  At surgeon's request and post-op pain management  Laterality: Left  Prep: chloraprep       Needles:  Injection technique: Single-shot  Needle Type: Echogenic Needle     Needle Length: 9cm 9 cm Needle Gauge: 21 and 21 G    Additional Needles:  Procedures: ultrasound guided (picture in chart) Supraclavicular block Narrative:  Start time: 08/16/2016 1:30 PM End time: 08/16/2016 1:35 PM Injection made incrementally with aspirations every 5 mL.  Performed by: Personally  Anesthesiologist: Shona SimpsonHOLLIS, Dreyden Rohrman D  Additional Notes: No complications noted. Pt tolerated well.

## 2016-08-16 NOTE — Transfer of Care (Signed)
Immediate Anesthesia Transfer of Care Note  Patient: Laurena SlimmerSamantha Ales  Procedure(s) Performed: Procedure(s): OPEN REDUCTION INTERNAL FIXATION (ORIF) DISTAL RADIAL FRACTURE (Right)  Patient Location: PACU  Anesthesia Type:GA combined with regional for post-op pain  Level of Consciousness: awake, alert , oriented and patient cooperative  Airway & Oxygen Therapy: Patient Spontanous Breathing and Patient connected to nasal cannula oxygen  Post-op Assessment: Report given to RN and Post -op Vital signs reviewed and stable  Post vital signs: Reviewed and stable  Last Vitals:  Vitals:   08/16/16 1340 08/16/16 1341  BP: 126/72 126/72  Pulse: 80 90  Resp: 10 20  Temp:      Last Pain:  Vitals:   08/16/16 1212  TempSrc:   PainSc: 9       Patients Stated Pain Goal: 3 (08/16/16 1212)  Complications: No apparent anesthesia complications

## 2016-08-16 NOTE — Anesthesia Procedure Notes (Signed)
Procedure Name: Intubation Date/Time: 08/16/2016 3:42 PM Performed by: Annabelle HarmanSMITH, Isack Lavalley A Pre-anesthesia Checklist: Patient identified, Emergency Drugs available, Suction available and Patient being monitored Patient Re-evaluated:Patient Re-evaluated prior to inductionOxygen Delivery Method: Circle system utilized Preoxygenation: Pre-oxygenation with 100% oxygen Intubation Type: IV induction Ventilation: Mask ventilation without difficulty Laryngoscope Size: Miller and 2 Grade View: Grade I Tube type: Oral Tube size: 7.0 mm Number of attempts: 1 Airway Equipment and Method: Stylet Placement Confirmation: ETT inserted through vocal cords under direct vision,  breath sounds checked- equal and bilateral and positive ETCO2 Secured at: 22 cm Tube secured with: Tape Dental Injury: Teeth and Oropharynx as per pre-operative assessment  Comments: Attempted to place LMA 4.  Not able to seat.  Attempted to place LMA 3.  Unable to seat.  Decision made to place ETT.  DL x1 with Mil 2.  Grade 1 view.  EBBS.  VSS throughout.

## 2016-08-16 NOTE — Anesthesia Preprocedure Evaluation (Signed)
Anesthesia Evaluation  Patient identified by MRN, date of birth, ID band Patient awake    Reviewed: Allergy & Precautions, NPO status , Patient's Chart, lab work & pertinent test results  Airway Mallampati: I  TM Distance: >3 FB Neck ROM: Full    Dental  (+) Teeth Intact, Dental Advisory Given   Pulmonary asthma ,    breath sounds clear to auscultation       Cardiovascular + Peripheral Vascular Disease   Rhythm:Regular Rate:Normal     Neuro/Psych PSYCHIATRIC DISORDERS Anxiety Depression  Neuromuscular disease    GI/Hepatic Neg liver ROS, hiatal hernia, GERD  ,  Endo/Other  negative endocrine ROS  Renal/GU Renal InsufficiencyRenal disease  negative genitourinary   Musculoskeletal  (+) Arthritis , Osteoarthritis,    Abdominal   Peds negative pediatric ROS (+)  Hematology negative hematology ROS (+)   Anesthesia Other Findings   Reproductive/Obstetrics negative OB ROS                             Anesthesia Physical Anesthesia Plan  ASA: II  Anesthesia Plan: General   Post-op Pain Management: GA combined w/ Regional for post-op pain   Induction: Intravenous  Airway Management Planned: LMA  Additional Equipment:   Intra-op Plan:   Post-operative Plan: Extubation in OR  Informed Consent: I have reviewed the patients History and Physical, chart, labs and discussed the procedure including the risks, benefits and alternatives for the proposed anesthesia with the patient or authorized representative who has indicated his/her understanding and acceptance.   Dental advisory given  Plan Discussed with: CRNA  Anesthesia Plan Comments:         Anesthesia Quick Evaluation

## 2016-08-16 NOTE — H&P (Signed)
Sherri [name] is an 50 y.o. female.    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  She states she pushed on it some.  She was placed in a splint and states she is having pain.  She has Ultram for pain.    CURRENT MEDICATIONS:  In the past, with her problem, she has been on Valium, high doses prior to currently, the baclofen 30 mg morning and evening and 20 mg at noontime.  She takes gabapentin 600 mg morning and evening and 300 mg at noontime.  ProAir albuterol inhaler, Advair Diskus daily, Claritin daily, Clarinex daily, Singulair 10 mg daily, Xyzal 5 mg daily, tramadol 1 p.o. every 6 hours.  She has an EpiPen, also uses some Max Freeze daily.   ALLERGIES:  Amoxicillin, ibuprofen, and Tylenol.    PAST MEDICAL/SURGICAL HISTORY:  Positive for acid reflux, anxiety, asthma, depression, bronchitis, DVT, occasional migraines, kidney disease, and past pneumonia.  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  She has had at least 4, likely 5, MRIs over time.    SOCIAL HISTORY:  The patient used to work for the [employer].    Past Medical History:  Diagnosis Date  . Angioedema   . Anxiety   . Arthritis   . Asthma   . CAP (community acquired pneumonia) 08/29/2012  . Chronic back pain   . Chronic kidney disease   . Cocaine abuse   . Depression   . Diarrhea   . DVT (deep venous thrombosis) (HCC)   . Environmental allergies   . Fall   . GERD (gastroesophageal reflux disease)   . Heart murmur    has been told once that she has a heart murmur, but has never had any problems  . Hiatal hernia 03/21/2013  . Lumbar herniated disc   . Peripheral vascular disease  (HCC)   . Pernicious anemia 10/30/2011  . Postconcussion syndrome 12/30/2014  . Seasonal allergies   . Stiff person syndrome   . Urticaria     Past Surgical History:  Procedure Laterality Date  . BUNIONECTOMY Left   . colonoscopy  07/09/15  . inguinal hernia 1983  1983    Family History  Problem Relation Age of Onset  . Cancer Mother   . Other Mother   . COPD Father   . Asthma Brother   . Cancer Brother     colon  . Heart attack Brother   . Seizures Brother    Social History:  reports that she has never smoked. She has never used smokeless tobacco. She reports that she drinks alcohol. She reports that she uses drugs, including Cocaine.  Allergies:  Allergies  Allergen Reactions  . Ibuprofen Other (See Comments)    Does not take due to hx of renal insufficiency  . Tylenol [Acetaminophen] Hives    Cannot take large quantities  . Lemon Flavor Swelling    Severe Lip Swelling   . Amoxicillin Diarrhea    No prescriptions prior to admission.    No results found for this or any previous visit (from the past 48 hour(s)). Dg Wrist Complete Left  Result Date: 08/14/2016 CLINICAL DATA:  Left wrist pain after falling today in  her bedroom. EXAM: LEFT WRIST - COMPLETE 3+ VIEW COMPARISON:  None. FINDINGS: Mildly comminuted and impacted fracture of the distal radial metaphysis with dorsal angulation of the distal fragment. No significant displacement. There is also an ulnar styloid fracture with radial displacement of the distal fragment. Associated soft tissue swelling. IMPRESSION: Distal radius and ulnar styloid fractures. Electronically Signed   By: Beckie SaltsSteven  Reid M.D.   On: 08/14/2016 15:45    Review of Systems  Constitutional: Negative.   HENT: Negative.   Respiratory: Negative.   Cardiovascular: Negative.   Gastrointestinal: Negative.   Musculoskeletal: Positive for joint pain.  Skin: Negative.   Neurological: Negative.   Psychiatric/Behavioral: Negative.     Last  menstrual period 07/29/2012. Physical Exam  Constitutional: She is oriented to person, place, and time. No distress.  HENT:  Head: Atraumatic.  Eyes: EOM are normal.  Neck: Normal range of motion.  Respiratory: Breath sounds normal.  GI: She exhibits no distension.  Neurological: She is alert and oriented to person, place, and time.  Skin: Skin is warm and dry.  Psychiatric: She has a normal mood and affect.    PHYSICAL EXAMINATION:  The patient is alert, pleasant, cooperative.  Height 5 feet 3 inches, weight 139.  BP 124/95, pulse 78.  Extraocular movements intact.  She has good cervical range of motion.  Upper extremity reflexes, biceps, triceps, are normal.  She is in a sugar-tong splint.  No audible wheezing.  The patient has a rolling walker that she has been using for ambulation and to help prevent falling.  Sensation in the fingertips is intact.  Good capillary refill.   RADIOGRAPHS/TEST:  X-rays reviewed, which show 50+ degrees of angulation.  Dorsally the fracture appears to be extra-articular.  She has a small ulnar styloid avulsion injury.    PLAN:  We discussed options.  We will proceed with volar plating.  She lives alone.  We will keep her overnight for overnight obs.  She will need to have someone with her to help with meals for a few days.  Procedure discussed.  Use of cast.  When she comes back for a postop visit, if sutures are removed at that visit or if sutures are not ready to be harvested, she will be placed back in a splint until she returns in 2 weeks time, at which time she can have the sutures removed and cast applied until she gets to 6 weeks postop.  She will continue with Ultram.  We will give her stronger pain medication after the surgery.  All questions answered.  Risks of surgery discussed, including bleeding, infection, possibility of hardware removal, although this is unlikely; possibility of some residual mild displacement or angulation and possibility of some pain  over the ulnar styloid region where she has the ulnar styloid tiny fracture.   Sherri [name],Natanael Saladin M, PA-C 08/16/2016, 11:01 AM

## 2016-08-16 NOTE — Progress Notes (Signed)
Verbal consent given from sister Bud Faceelly Mebans with two RNs Domenica Failomika Jones as second verifier

## 2016-08-16 NOTE — Brief Op Note (Signed)
08/16/2016  4:53 PM  PATIENT:  Sherri [name]  50 y.o. female  PRE-OPERATIVE DIAGNOSIS:  Left Distal Radius Fracture  POST-OPERATIVE DIAGNOSIS:  same  PROCEDURE:  Procedure(s): OPEN REDUCTION INTERNAL FIXATION (ORIF) DISTAL RADIAL FRACTURE (Right)  SURGEON:  Surgeon(s) and Role:    * Eldred MangesMark C Addysyn Fern, MD - Primary  PHYSICIAN ASSISTANT:   ASSISTANTS: Zonia KiefJames Owens PA-C   ANESTHESIA:   regional and general  EBL:  Total I/O In: -  Out: 15 [Blood:15]  BLOOD ADMINISTERED:none  DRAINS: none   LOCAL MEDICATIONS USED:  NONE  SPECIMEN:  No Specimen  DISPOSITION OF SPECIMEN:  N/A  COUNTS:  YES  TOURNIQUET:   Total Tourniquet Time Documented: Upper Arm (Left) - 28 minutes Total: Upper Arm (Left) - 28 minutes   DICTATION: .Other Dictation: Dictation Number [id number]  PLAN OF CARE: Admit for overnight observation  PATIENT DISPOSITION:  PACU - hemodynamically stable.   Delay start of Pharmacological VTE agent (>24hrs) due to surgical blood loss or risk of bleeding: not applicable

## 2016-08-16 NOTE — Progress Notes (Signed)
Orthopedic Tech Progress Note Patient Details:  Laurena SlimmerSamantha Soland 1966-09-09 [id number]  Ortho Devices Type of Ortho Device: Arm sling Ortho Device/Splint Location: LUE Ortho Device/Splint Interventions: Ordered, Application   Jennye MoccasinHughes, Shritha Bresee Craig 08/16/2016, 6:40 PM

## 2016-08-17 ENCOUNTER — Encounter (HOSPITAL_COMMUNITY): Payer: Self-pay | Admitting: Orthopaedic Surgery

## 2016-08-17 DIAGNOSIS — G2582 Stiff-man syndrome: Secondary | ICD-10-CM | POA: Diagnosis not present

## 2016-08-17 DIAGNOSIS — F329 Major depressive disorder, single episode, unspecified: Secondary | ICD-10-CM | POA: Diagnosis not present

## 2016-08-17 DIAGNOSIS — S52612A Displaced fracture of left ulna styloid process, initial encounter for closed fracture: Secondary | ICD-10-CM | POA: Diagnosis not present

## 2016-08-17 DIAGNOSIS — N189 Chronic kidney disease, unspecified: Secondary | ICD-10-CM | POA: Diagnosis not present

## 2016-08-17 DIAGNOSIS — J45909 Unspecified asthma, uncomplicated: Secondary | ICD-10-CM | POA: Diagnosis not present

## 2016-08-17 DIAGNOSIS — S59202A Unspecified physeal fracture of lower end of radius, left arm, initial encounter for closed fracture: Secondary | ICD-10-CM | POA: Diagnosis not present

## 2016-08-17 MED ORDER — OXYCODONE-ACETAMINOPHEN 5-325 MG PO TABS: 1.0000 | ORAL_TABLET | ORAL | 0 refills | Status: DC | PRN

## 2016-08-17 NOTE — Progress Notes (Signed)
Pt discharged to home via wheelchair accompanied by pastor per MD order without difficulty. Prior to discharge, all discharge teachings done with pt both written and verbal. All questions answered. Pt verb understanding and agrees to comply. No change from AM assessment. Pain tolerable on discharge. Pt has prescription for Percocet on discharge.

## 2016-08-17 NOTE — Op Note (Signed)
[name]:  [name], [name]             ACCOUNT NO.:  [id number]  MEDICAL RECORD NO.:  [id number]  LOCATION:  5N12C                        FACILITY:  [facility]  PHYSICIAN:  Enid Maultsby C. Ophelia CharterYates, M.D.    DATE OF BIRTH:  08-04-1966  DATE OF PROCEDURE:  08/16/2016 DATE OF DISCHARGE:                              OPERATIVE REPORT   PREOPERATIVE DIAGNOSIS:  Angulated left distal radius fracture.  POSTOPERATIVE DIAGNOSIS:  Angulated left distal radius fracture.  PROCEDURE:  Open reduction and internal fixation of left distal radius fracture.  SURGEON:  Zonie Crutcher C. Ophelia CharterYates, M.D.  ANESTHESIA:  General plus preoperative block.  ASSISTANT:  Genene ChurnJames M. Barry Dieneswens, PA-C, medically necessary and present for the entire procedure.  TOURNIQUET TIME:  27 minutes.  DESCRIPTION OF PROCEDURE:  After induction of general anesthesia, standard prepping and draping with DuraPrep, sterile skin marker was used and incision was made directly over the FCR tendon.  Tendon sheath was split anteriorly, tendon was pulled radially to protect the radial artery and the posterior sheath was opened, pulling the pronator off the radial aspect of the radius, peeling off subperiosteally.  Fracture was reduced.  A thin plate was selected, this was an extra-articular fracture and fracture was reduced.  Checking with C-arm, it would tend to want to lose the corrected angulation.  Initially, two distal pins, starting on the distal-most row, starting on the ulnar aspect were filled with 18 and 20-mm smooth pegs.  As we visualized it under C-arm, we lost some angulation and pegs were removed.  Repeat reduction and then repeat placement of the pegs, holding it securely.  Additional pegs were filled.  Plate may have been 0.5 mm off the volar cortex, was in good position.  Proximal row was filled following the ulnar to radial side and then multidirectional 20-mm screw was placed in the ulnar styloid.  Three-view standard fluoroscopic pictures were  taken.  AP, lateral and then angled view looking down the wrist confirming that no pegs were in the joint.  There was satisfactory reduction.  The patient had about 40 degrees of dorsal angulation and postop was corrected past neutral toward volar angulation.  The patient tolerated the procedure well.  After irrigation, closure with 3-0 Vicryl for subcutaneous tissue followed by 3-0 Vicryl for subcuticular closure.  Tincture of benzoin, Steri-Strips were applied, postop dressing, twiners and splint dorsally.    Jidenna Figgs C. Ophelia CharterYates, M.D.    MCY/MEDQ  D:  08/16/2016  T:  08/16/2016  Job:  [id number]

## 2016-08-17 NOTE — Progress Notes (Signed)
Subjective: 1 Day Post-Op Procedure(s) (LRB): OPEN REDUCTION INTERNAL FIXATION (ORIF) DISTAL RADIAL FRACTURE (Right) Patient reports pain as mild.    Objective: Vital signs in last 24 hours: Temp:  [97.9 F (36.6 C)-98.5 F (36.9 C)] 98.3 F (36.8 C) (08/24 0452) Pulse Rate:  [70-110] 89 (08/24 0452) Resp:  [10-20] 18 (08/24 0452) BP: (109-149)/(63-95) 114/63 (08/24 0452) SpO2:  [98 %-100 %] 99 % (08/24 0452) Weight:  [64.1 kg (141 lb 4 oz)] 64.1 kg (141 lb 4 oz) (08/23 1157)  Intake/Output from previous day: 08/23 0701 - 08/24 0700 In: 1776.3 [I.V.:1776.3] Out: 15 [Blood:15] Intake/Output this shift: Total I/O In: 826.3 [I.V.:826.3] Out: -    Recent Labs  08/16/16 1228  HGB 11.3*    Recent Labs  08/16/16 1228  WBC 6.9  RBC 4.10  HCT 35.1*  PLT 265    Recent Labs  08/16/16 1228  NA 140  K 3.7  CL 105  CO2 26  BUN 9  CREATININE 1.13*  GLUCOSE 122*  CALCIUM 9.6   No results for input(s): LABPT, INR in the last 72 hours.  Neurologically intact  Assessment/Plan: 1 Day Post-Op Procedure(s) (LRB): OPEN REDUCTION INTERNAL FIXATION (ORIF) DISTAL RADIAL FRACTURE (Right) Plan: discharge home office 2 wks  Analei Whinery C 08/17/2016, 6:51 AM

## 2016-08-21 NOTE — Anesthesia Postprocedure Evaluation (Signed)
Anesthesia Post Note  Patient: Sherri [name]  Procedure(s) Performed: Procedure(s) (LRB): OPEN REDUCTION INTERNAL FIXATION (ORIF) DISTAL RADIAL FRACTURE (Right)  Patient location during evaluation: PACU Anesthesia Type: General Level of consciousness: awake and alert Pain management: pain level controlled Vital Signs Assessment: post-procedure vital signs reviewed and stable Respiratory status: spontaneous breathing, nonlabored ventilation, respiratory function stable and patient connected to nasal cannula oxygen Cardiovascular status: blood pressure returned to baseline and stable Postop Assessment: no signs of nausea or vomiting Anesthetic complications: no    Last Vitals:  Vitals:   08/17/16 0452 08/17/16 1300  BP: 114/63 (!) 111/58  Pulse: 89 70  Resp: 18 16  Temp: 36.8 C 36.8 C    Last Pain:  Vitals:   08/17/16 1326  TempSrc:   PainSc: 4                  [name]

## 2016-08-22 ENCOUNTER — Other Ambulatory Visit: Payer: Self-pay | Admitting: Neurology

## 2016-08-22 ENCOUNTER — Telehealth: Payer: Self-pay | Admitting: Neurology

## 2016-08-22 MED ORDER — BACLOFEN 20 MG PO TABS: ORAL_TABLET | ORAL | 1 refills | Status: DC

## 2016-08-22 MED ORDER — GABAPENTIN 300 MG PO CAPS: ORAL_CAPSULE | ORAL | 1 refills | Status: DC

## 2016-08-22 NOTE — Telephone Encounter (Signed)
Patient requesting refill of baclofen (LIORESAL) 20 MG tablet gabapentin (NEURONTIN) 300 MG capsule Pharmacy: [facility] - [place], [place] - [address] AT [address]

## 2016-08-22 NOTE — Telephone Encounter (Signed)
I will refill the baclofen and gabapentin.

## 2016-08-22 NOTE — Telephone Encounter (Signed)
States she is taking the following doses:  1) gabapentin 300mg :  600mg  in am, 300mg  midday, 600mg  qhs  2) baclofen 20mg :  30mg  in am, 20mg  midday, 30mg  qhs  Baclofen has been removed from her medication list but she states this was in error.  Also, says that she was instructed to call here for these refills so only one MD is managing her medications.  She is requesting a 90-day supply of each medication be [facility].

## 2016-08-24 ENCOUNTER — Other Ambulatory Visit (HOSPITAL_BASED_OUTPATIENT_CLINIC_OR_DEPARTMENT_OTHER): Payer: Medicare Other

## 2016-08-24 ENCOUNTER — Ambulatory Visit (HOSPITAL_BASED_OUTPATIENT_CLINIC_OR_DEPARTMENT_OTHER): Payer: Medicare Other | Admitting: Family

## 2016-08-24 ENCOUNTER — Encounter: Payer: Self-pay | Admitting: Family

## 2016-08-24 ENCOUNTER — Ambulatory Visit (HOSPITAL_BASED_OUTPATIENT_CLINIC_OR_DEPARTMENT_OTHER): Payer: Medicare Other

## 2016-08-24 VITALS — BP 122/65 | HR 65 | Temp 97.7°F | Resp 16 | Ht 63.0 in | Wt 138.0 lb

## 2016-08-24 DIAGNOSIS — D509 Iron deficiency anemia, unspecified: Secondary | ICD-10-CM | POA: Diagnosis not present

## 2016-08-24 DIAGNOSIS — D510 Vitamin B12 deficiency anemia due to intrinsic factor deficiency: Secondary | ICD-10-CM | POA: Diagnosis not present

## 2016-08-24 DIAGNOSIS — E559 Vitamin D deficiency, unspecified: Secondary | ICD-10-CM

## 2016-08-24 DIAGNOSIS — Z86718 Personal history of other venous thrombosis and embolism: Secondary | ICD-10-CM | POA: Diagnosis not present

## 2016-08-24 DIAGNOSIS — S52352D Displaced comminuted fracture of shaft of radius, left arm, subsequent encounter for closed fracture with routine healing: Secondary | ICD-10-CM | POA: Diagnosis not present

## 2016-08-24 LAB — COMPREHENSIVE METABOLIC PANEL
ALBUMIN: 3.8 g/dL (ref 3.5–5.0)
ALK PHOS: 137 U/L (ref 40–150)
ALT: 11 U/L (ref 0–55)
AST: 14 U/L (ref 5–34)
Anion Gap: 9 mEq/L (ref 3–11)
BUN: 10.8 mg/dL (ref 7.0–26.0)
CO2: 26 mEq/L (ref 22–29)
Calcium: 9.9 mg/dL (ref 8.4–10.4)
Chloride: 107 mEq/L (ref 98–109)
Creatinine: 1.2 mg/dL — ABNORMAL HIGH (ref 0.6–1.1)
EGFR: 61 mL/min/{1.73_m2} — AB (ref >90)
Glucose: 200 mg/dl — ABNORMAL HIGH (ref 70–140)
POTASSIUM: 3.7 meq/L (ref 3.5–5.1)
SODIUM: 143 meq/L (ref 136–145)
Total Bilirubin: 0.32 mg/dL (ref 0.20–1.20)
Total Protein: 7.7 g/dL (ref 6.4–8.3)

## 2016-08-24 LAB — CBC WITH DIFFERENTIAL (CANCER CENTER ONLY)
BASO#: 0 10*3/uL (ref 0.0–0.2)
BASO%: 0 % (ref 0.0–2.0)
EOS%: 0 % (ref 0.0–7.0)
Eosinophils Absolute: 0 10*3/uL (ref 0.0–0.5)
HCT: 34.2 % — ABNORMAL LOW (ref 34.8–46.6)
HEMOGLOBIN: 11.5 g/dL — AB (ref 11.6–15.9)
LYMPH#: 1.4 10*3/uL (ref 0.9–3.3)
LYMPH%: 20.6 % (ref 14.0–48.0)
MCH: 28.5 pg (ref 26.0–34.0)
MCHC: 33.6 g/dL (ref 32.0–36.0)
MCV: 85 fL (ref 81–101)
MONO#: 0.3 10*3/uL (ref 0.1–0.9)
MONO%: 4.4 % (ref 0.0–13.0)
NEUT%: 75 % (ref 39.6–80.0)
NEUTROS ABS: 5 10*3/uL (ref 1.5–6.5)
Platelets: 323 10*3/uL (ref 145–400)
RBC: 4.04 10*6/uL (ref 3.70–5.32)
RDW: 12.4 % (ref 11.1–15.7)
WBC: 6.7 10*3/uL (ref 3.9–10.0)

## 2016-08-24 LAB — IRON AND TIBC
%SAT: 27 % (ref 21–57)
Iron: 62 ug/dL (ref 41–142)
TIBC: 231 ug/dL — AB (ref 236–444)
UIBC: 169 ug/dL (ref 120–384)

## 2016-08-24 LAB — FERRITIN: Ferritin: 573 ng/ml — ABNORMAL HIGH (ref 9–269)

## 2016-08-24 MED ORDER — CYANOCOBALAMIN 1000 MCG/ML IJ SOLN
INTRAMUSCULAR | Status: AC
  Filled 2016-08-24: qty 1

## 2016-08-24 MED ORDER — CYANOCOBALAMIN 1000 MCG/ML IJ SOLN
1000.0000 ug | Freq: Once | INTRAMUSCULAR | Status: AC
  Administered 2016-08-24: 1000 ug via INTRAMUSCULAR

## 2016-08-24 NOTE — Discharge Summary (Signed)
Patient ID: Sherri [name] MRN: [id number] DOB/AGE: 02/11/1966 50 y.o.  Admit date: 08/16/2016 Discharge date: 08/24/2016  Admission Diagnoses:  Active Problems:   Distal radius fracture, right   Discharge Diagnoses:  Active Problems:   Distal radius fracture, right  status post Procedure(s): OPEN REDUCTION INTERNAL FIXATION (ORIF) DISTAL RADIAL FRACTURE  Past Medical History:  Diagnosis Date  . Angioedema   . Anxiety   . Arthritis   . Asthma   . CAP (community acquired pneumonia) 08/29/2012  . Chronic back pain   . Chronic kidney disease   . Cocaine abuse   . Depression   . Diarrhea   . DVT (deep venous thrombosis) (HCC)   . Environmental allergies   . Fall   . GERD (gastroesophageal reflux disease)   . Heart murmur    has been told once that she has a heart murmur, but has never had any problems  . Hiatal hernia 03/21/2013  . Lumbar herniated disc   . Peripheral vascular disease (HCC)   . Pernicious anemia 10/30/2011  . Postconcussion syndrome 12/30/2014  . Seasonal allergies   . Stiff person syndrome   . Urticaria     Surgeries: Procedure(s): OPEN REDUCTION INTERNAL FIXATION (ORIF) DISTAL RADIAL FRACTURE on 08/16/2016   Consultants:   Discharged Condition: Improved  Hospital Course: Sherri [name] is an 50 y.o. female who was admitted 08/16/2016 for operative treatment of distal radius fracture. Patient failed conservative treatments (please see the history and physical for the specifics) and had severe unremitting pain that affects sleep, daily activities and work/hobbies. After pre-op clearance, the patient was taken to the operating room on 08/16/2016 and underwent  Procedure(s): OPEN REDUCTION INTERNAL FIXATION (ORIF) DISTAL RADIAL FRACTURE.    Patient was given perioperative antibiotics:  Anti-infectives    Start     Dose/Rate Route Frequency Ordered Stop   08/16/16 2200  ceFAZolin (ANCEF) IVPB 1 g/50 mL premix     1 g 100 mL/hr over 30 Minutes  Intravenous Every 6 hours 08/16/16 1827 08/17/16 0357   08/16/16 1315  clindamycin (CLEOCIN) IVPB 900 mg     900 mg 100 mL/hr over 30 Minutes Intravenous On call to O.R. 08/16/16 1238 08/16/16 1617   08/16/16 1244  clindamycin (CLEOCIN) 900 MG/50ML IVPB    Comments:  [place]onna [place]   : cabinet override      08/16/16 1244 08/17/16 0059       Patient was given sequential compression devices and early ambulation to prevent DVT.   Patient benefited maximally from hospital stay and there were no complications. At the time of discharge, the patient was urinating/moving their bowels without difficulty, tolerating a regular diet, pain is controlled with oral pain medications and they have been cleared by PT/OT.   Recent vital signs: No data found.    Recent laboratory studies: No results for input(s): WBC, HGB, HCT, PLT, NA, K, CL, CO2, BUN, CREATININE, GLUCOSE, INR, CALCIUM in the last 72 hours.  Invalid input(s): PT, 2   Discharge Medications:     Medication List    TAKE these medications   albuterol 108 (90 Base) MCG/ACT inhaler Commonly known as:  PROVENTIL HFA;VENTOLIN HFA Inhale 1 puff into the lungs every 6 (six) hours as needed. PRO AIR   cholecalciferol 1000 units tablet Commonly known as:  VITAMIN D Take 1,000 Units by mouth daily.   codeine 30 MG tablet Take 1 tablet (30 mg total) by mouth every 6 (six) hours as needed.   cyanocobalamin  1000 MCG/ML injection Commonly known as:  (VITAMIN B-12) Inject 1,000 mcg into the muscle every 3 (three) months.   desloratadine 5 MG tablet Commonly known as:  CLARINEX Take 1 tablet (5 mg total) by mouth daily.   diclofenac sodium 1 % Gel Commonly known as:  VOLTAREN Apply 2 g topically 4 (four) times daily.   diphenhydrAMINE 25 MG tablet Commonly known as:  BENADRYL Take 25 mg by mouth at bedtime as needed for itching or allergies.   EPIPEN 2-PAK 0.3 mg/0.3 mL Soaj injection Generic drug:  EPINEPHrine USE AS DIRECTED  FOR  SEVERE  ALLERGIC  REACTIONS   FLUoxetine 10 MG capsule Commonly known as:  PROZAC Take 10 mg by mouth daily.   Fluticasone-Salmeterol 250-50 MCG/DOSE Aepb Commonly known as:  ADVAIR Inhale 1 puff into the lungs 2 (two) times daily.   levocetirizine 5 MG tablet Commonly known as:  XYZAL Take 1 tablet (5 mg total) by mouth every evening.   loratadine 10 MG tablet Commonly known as:  CLARITIN Take 10 mg by mouth daily.   montelukast 10 MG tablet Commonly known as:  SINGULAIR Take 10 mg by mouth at bedtime.   oxyCODONE-acetaminophen 5-325 MG tablet Commonly known as:  ROXICET Take 1-2 tablets by mouth every 4 (four) hours as needed for severe pain.   traMADol 50 MG tablet Commonly known as:  ULTRAM Take 50 mg by mouth every 6 (six) hours as needed for moderate pain.       Diagnostic Studies: Dg Wrist Complete Left  Result Date: 08/14/2016 CLINICAL DATA:  Left wrist pain after falling today in her bedroom. EXAM: LEFT WRIST - COMPLETE 3+ VIEW COMPARISON:  None. FINDINGS: Mildly comminuted and impacted fracture of the distal radial metaphysis with dorsal angulation of the distal fragment. No significant displacement. There is also an ulnar styloid fracture with radial displacement of the distal fragment. Associated soft tissue swelling. IMPRESSION: Distal radius and ulnar styloid fractures. Electronically Signed   By: Beckie SaltsSteven  Reid M.D.   On: 08/14/2016 15:45        Discharge Plan:  discharge to home Disposition:     Signed: Naida SleightOWENS,Samaria Anes M 08/24/2016, 10:09 AM For mark yates md

## 2016-08-24 NOTE — Patient Instructions (Signed)

## 2016-08-24 NOTE — Progress Notes (Signed)
Hematology and Oncology Follow Up Visit  Sherri [name] [id number] 19-Mar-1966 50 y.o. 08/24/2016   Principle Diagnosis:  1. Pernicious anemia - anti-intrinsic factor antibodies.  2. Intermittent iron-deficiency anemia.  4. 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   Current Therapy:   1. Vitamin B12, 1 mg IM q.4 months.  2. IV iron as indicated - last received in October 2016    Interim History: Sherri [name] is here for a follow-up today. She is doing fairly well. Unfortunately, she had another fall due to leg spasms and lost her footing. She broke her left wrist and has a brace on. She has a follow-up and scans today with ortho. She denies any pain at this time.  She has also had some issues with IBS and is having diarrhea. She is eating a fiber diet and avoiding triggers like spinach and cabbage. This has helped.  She had a great response to the [place] she received last October. Her iron levels have been stable. No fever, chills, n/v, cough, rash, headache, dizziness, SOB, chest pain, palpitations, abdominal pain or changes in bladder habits.  No syncope. No lymphadenopathy found on exam. No episodes of bleeding or bruising.  No swelling or numbness in her extremities. The intermittent tingling in her hands and feet is unchanged.  She has maintained a good appetite and is staying hydrated. Her weight is stable.   Medications:    Medication List       Accurate as of 08/24/16 10:38 AM. Always use your most recent med list.          albuterol 108 (90 Base) MCG/ACT inhaler Commonly known as:  PROVENTIL HFA;VENTOLIN HFA Inhale 1 puff into the lungs every 6 (six) hours as needed. PRO AIR   baclofen 20 MG tablet Commonly known as:  LIORESAL 1.5 tablets in the morning and evening, one tablet at midday   cholecalciferol 1000 units tablet Commonly known as:  VITAMIN D Take 1,000 Units by mouth daily.   Vitamin D3 5000 units Caps Take by mouth.   codeine 30 MG  tablet Take 1 tablet (30 mg total) by mouth every 6 (six) hours as needed.   cyanocobalamin 1000 MCG/ML injection Commonly known as:  (VITAMIN B-12) Inject 1,000 mcg into the muscle every 3 (three) months.   desloratadine 5 MG tablet Commonly known as:  CLARINEX Take 1 tablet (5 mg total) by mouth daily.   diclofenac sodium 1 % Gel Commonly known as:  VOLTAREN Apply 2 g topically 4 (four) times daily.   diphenhydrAMINE 25 MG tablet Commonly known as:  BENADRYL Take 25 mg by mouth at bedtime as needed for itching or allergies.   EPIPEN 2-PAK 0.3 mg/0.3 mL Soaj injection Generic drug:  EPINEPHrine USE AS DIRECTED FOR  SEVERE  ALLERGIC  REACTIONS   FLUoxetine 10 MG capsule Commonly known as:  PROZAC Take 10 mg by mouth daily.   Fluticasone-Salmeterol 250-50 MCG/DOSE Aepb Commonly known as:  ADVAIR Inhale 1 puff into the lungs 2 (two) times daily.   gabapentin 300 MG capsule Commonly known as:  NEURONTIN 2 capsules in the morning and evening, one capsule at midday   levocetirizine 5 MG tablet Commonly known as:  XYZAL Take 1 tablet (5 mg total) by mouth every evening.   loratadine 10 MG tablet Commonly known as:  CLARITIN Take 10 mg by mouth daily.   montelukast 10 MG tablet Commonly known as:  SINGULAIR Take 10 mg by mouth at bedtime.  OVER THE COUNTER MEDICATION I B Guard   oxyCODONE-acetaminophen 5-325 MG tablet Commonly known as:  ROXICET Take 1-2 tablets by mouth every 4 (four) hours as needed for severe pain.   traMADol 50 MG tablet Commonly known as:  ULTRAM Take 50 mg by mouth every 6 (six) hours as needed for moderate pain.       Allergies:  Allergies  Allergen Reactions  . Ibuprofen Other (See Comments)    Does not take due to hx of renal insufficiency  . Tylenol [Acetaminophen] Hives    Cannot take large quantities  . Lemon Flavor Swelling    Severe Lip Swelling   . Amoxicillin Diarrhea    Past Medical History, Surgical history, Social  history, and Family History were reviewed and updated.  Review of Systems: All other 10 point review of systems is negative.   Physical Exam:  height is 5\' 3"  (1.6 m) and weight is 138 lb (62.6 kg). Her oral temperature is 97.7 F (36.5 C). Her blood pressure is 122/65 and her pulse is 65. Her respiration is 16.   Wt Readings from Last 3 Encounters:  08/24/16 138 lb (62.6 kg)  08/16/16 141 lb 4 oz (64.1 kg)  07/06/16 141 lb (64 kg)    Ocular: Sclerae unicteric, pupils equal, round and reactive to light Ear-nose-throat: Oropharynx clear, dentition fair Lymphatic: No cervical supraclavicular or axillary adenopathy Lungs no rales or rhonchi, good excursion bilaterally Heart regular rate and rhythm, no murmur appreciated Abd soft, nontender, positive bowel sounds, no liver or spleen tip palpated on exam, no fluid wave MSK no focal spinal tenderness, no joint edema Neuro: non-focal, well-oriented, appropriate affect Breasts: Deferred  Lab Results  Component Value Date   WBC 6.7 08/24/2016   HGB 11.5 (L) 08/24/2016   HCT 34.2 (L) 08/24/2016   MCV 85 08/24/2016   PLT 323 08/24/2016   Lab Results  Component Value Date   FERRITIN 506 (H) 04/20/2016   IRON 106 04/20/2016   TIBC 231 (L) 04/20/2016   UIBC 125 04/20/2016   IRONPCTSAT 46 04/20/2016   Lab Results  Component Value Date   RETICCTPCT 1.8 12/14/2015   RBC 4.04 08/24/2016   RETICCTABS 71.8 12/14/2015   Lab Results  Component Value Date   KPAFRELGTCHN 1.06 08/02/2011   LAMBDASER 1.49 08/02/2011   KAPLAMBRATIO 0.71 08/02/2011   Lab Results  Component Value Date   IGGSERUM 1180 08/02/2011   IGA 206 08/02/2011   IGMSERUM 198 08/02/2011   Lab Results  Component Value Date   TOTALPROTELP 6.6 08/02/2011   ALBUMINELP 57.1 08/02/2011   A1GS 4.1 08/02/2011   A2GS 9.6 08/02/2011   BETS 6.1 08/02/2011   BETA2SER 5.9 08/02/2011   GAMS 17.2 08/02/2011   MSPIKE NOT DET 08/02/2011   SPEI * 08/02/2011     Chemistry       Component Value Date/Time   NA 140 08/16/2016 1228   NA 142 04/20/2016 1423   NA 143 12/14/2015 1022   K 3.7 08/16/2016 1228   K 4.0 04/20/2016 1423   K 3.8 12/14/2015 1022   CL 105 08/16/2016 1228   CL 109 (H) 04/20/2016 1423   CL 106 03/10/2015 1021   CO2 26 08/16/2016 1228   CO2 24 04/20/2016 1423   CO2 26 12/14/2015 1022   BUN 9 08/16/2016 1228   BUN 9 04/20/2016 1423   BUN 9.9 12/14/2015 1022   CREATININE 1.13 (H) 08/16/2016 1228   CREATININE 0.99 04/20/2016 1423   CREATININE  1.1 12/14/2015 1022      Component Value Date/Time   CALCIUM 9.6 08/16/2016 1228   CALCIUM 9.6 04/20/2016 1423   CALCIUM 9.7 12/14/2015 1022   ALKPHOS 96 08/16/2016 1228   ALKPHOS 116 04/20/2016 1423   ALKPHOS 104 12/14/2015 1022   AST 15 08/16/2016 1228   AST 13 04/20/2016 1423   AST 17 12/14/2015 1022   ALT 12 (L) 08/16/2016 1228   ALT 12 04/20/2016 1423   ALT 14 12/14/2015 1022   BILITOT 0.6 08/16/2016 1228   BILITOT <0.2 04/20/2016 1423   BILITOT <0.30 12/14/2015 1022     Impression and Plan: Ms. Lien is 50 yo African American female with pernicious anemia and intermittent iron deficiency anemia. She is doing well recuperating from a recent fall that ended with a broken left wrist. She has a brace on and is follow-up with ortho today.  We will proceed with her B 12 injection today as planned.  We will see what her iron studies show and bring her back in next week for an infusion if needed.  We will plan to see her back in 4 months for labs and follow-up.  She is knows to call here with any questions or concerns. We can certainly see her sooner if need be.   Verdie Mosher, NP 8/31/201710:38 AM

## 2016-08-25 ENCOUNTER — Telehealth: Payer: Self-pay

## 2016-08-25 LAB — VITAMIN D 25 HYDROXY (VIT D DEFICIENCY, FRACTURES): VIT D 25 HYDROXY: 58.8 ng/mL (ref 30.0–100.0)

## 2016-08-25 LAB — RETICULOCYTES: Reticulocyte Count: 1.3 % (ref 0.6–2.6)

## 2016-08-25 LAB — VITAMIN B12: Vitamin B12: 467 pg/mL (ref 211–946)

## 2016-08-25 NOTE — Telephone Encounter (Addendum)
-----   Message from Verdie MosherSarah M [place], NP sent at 08/25/2016  9:46 AM EDT ----- Regarding: Iron  Iron studies and B 12 look good. No iron needed at this time! Thanks!  SArah  Above message given to pt via phone. Pt verbalizes understanding and appreciation. dph

## 2016-08-31 DIAGNOSIS — R2681 Unsteadiness on feet: Secondary | ICD-10-CM | POA: Diagnosis not present

## 2016-08-31 DIAGNOSIS — M545 Low back pain: Secondary | ICD-10-CM | POA: Diagnosis not present

## 2016-08-31 DIAGNOSIS — M6281 Muscle weakness (generalized): Secondary | ICD-10-CM | POA: Diagnosis not present

## 2016-09-04 DIAGNOSIS — M5137 Other intervertebral disc degeneration, lumbosacral region: Secondary | ICD-10-CM | POA: Diagnosis not present

## 2016-09-04 DIAGNOSIS — Z79899 Other long term (current) drug therapy: Secondary | ICD-10-CM | POA: Diagnosis not present

## 2016-09-04 DIAGNOSIS — Z79891 Long term (current) use of opiate analgesic: Secondary | ICD-10-CM | POA: Diagnosis not present

## 2016-09-04 DIAGNOSIS — M1288 Other specific arthropathies, not elsewhere classified, other specified site: Secondary | ICD-10-CM | POA: Diagnosis not present

## 2016-09-04 DIAGNOSIS — M4696 Unspecified inflammatory spondylopathy, lumbar region: Secondary | ICD-10-CM | POA: Diagnosis not present

## 2016-09-04 DIAGNOSIS — G894 Chronic pain syndrome: Secondary | ICD-10-CM | POA: Diagnosis not present

## 2016-09-06 DIAGNOSIS — R2681 Unsteadiness on feet: Secondary | ICD-10-CM | POA: Diagnosis not present

## 2016-09-06 DIAGNOSIS — M545 Low back pain: Secondary | ICD-10-CM | POA: Diagnosis not present

## 2016-09-06 DIAGNOSIS — M6281 Muscle weakness (generalized): Secondary | ICD-10-CM | POA: Diagnosis not present

## 2016-09-11 DIAGNOSIS — M47817 Spondylosis without myelopathy or radiculopathy, lumbosacral region: Secondary | ICD-10-CM | POA: Diagnosis not present

## 2016-09-13 DIAGNOSIS — M545 Low back pain: Secondary | ICD-10-CM | POA: Diagnosis not present

## 2016-09-13 DIAGNOSIS — R2681 Unsteadiness on feet: Secondary | ICD-10-CM | POA: Diagnosis not present

## 2016-09-13 DIAGNOSIS — M6281 Muscle weakness (generalized): Secondary | ICD-10-CM | POA: Diagnosis not present

## 2016-09-17 ENCOUNTER — Encounter: Payer: Self-pay | Admitting: *Deleted

## 2016-09-20 DIAGNOSIS — M545 Low back pain: Secondary | ICD-10-CM | POA: Diagnosis not present

## 2016-09-20 DIAGNOSIS — R2681 Unsteadiness on feet: Secondary | ICD-10-CM | POA: Diagnosis not present

## 2016-09-20 DIAGNOSIS — M6281 Muscle weakness (generalized): Secondary | ICD-10-CM | POA: Diagnosis not present

## 2016-09-25 DIAGNOSIS — M47817 Spondylosis without myelopathy or radiculopathy, lumbosacral region: Secondary | ICD-10-CM | POA: Diagnosis not present

## 2016-09-26 ENCOUNTER — Ambulatory Visit (INDEPENDENT_AMBULATORY_CARE_PROVIDER_SITE_OTHER): Payer: Medicare Other | Admitting: Orthopaedic Surgery

## 2016-09-26 DIAGNOSIS — S52532D Colles' fracture of left radius, subsequent encounter for closed fracture with routine healing: Secondary | ICD-10-CM

## 2016-09-27 ENCOUNTER — Ambulatory Visit (INDEPENDENT_AMBULATORY_CARE_PROVIDER_SITE_OTHER): Payer: Medicare Other | Admitting: Specialist

## 2016-10-11 DIAGNOSIS — F410 Panic disorder [episodic paroxysmal anxiety] without agoraphobia: Secondary | ICD-10-CM | POA: Diagnosis not present

## 2016-10-11 DIAGNOSIS — F411 Generalized anxiety disorder: Secondary | ICD-10-CM | POA: Diagnosis not present

## 2016-10-23 DIAGNOSIS — M4696 Unspecified inflammatory spondylopathy, lumbar region: Secondary | ICD-10-CM | POA: Diagnosis not present

## 2016-10-23 DIAGNOSIS — M5137 Other intervertebral disc degeneration, lumbosacral region: Secondary | ICD-10-CM | POA: Diagnosis not present

## 2016-10-23 DIAGNOSIS — M47817 Spondylosis without myelopathy or radiculopathy, lumbosacral region: Secondary | ICD-10-CM | POA: Diagnosis not present

## 2016-10-23 DIAGNOSIS — Z79899 Other long term (current) drug therapy: Secondary | ICD-10-CM | POA: Diagnosis not present

## 2016-10-23 DIAGNOSIS — G894 Chronic pain syndrome: Secondary | ICD-10-CM | POA: Diagnosis not present

## 2016-10-23 DIAGNOSIS — Z79891 Long term (current) use of opiate analgesic: Secondary | ICD-10-CM | POA: Diagnosis not present

## 2016-10-24 ENCOUNTER — Ambulatory Visit (INDEPENDENT_AMBULATORY_CARE_PROVIDER_SITE_OTHER): Payer: Medicare Other | Admitting: Orthopaedic Surgery

## 2016-10-24 ENCOUNTER — Ambulatory Visit (INDEPENDENT_AMBULATORY_CARE_PROVIDER_SITE_OTHER): Payer: Medicare Other

## 2016-10-24 ENCOUNTER — Encounter (INDEPENDENT_AMBULATORY_CARE_PROVIDER_SITE_OTHER): Payer: Self-pay | Admitting: Orthopaedic Surgery

## 2016-10-24 VITALS — BP 101/64 | HR 84 | Ht 63.0 in | Wt 139.2 lb

## 2016-10-24 DIAGNOSIS — S62102D Fracture of unspecified carpal bone, left wrist, subsequent encounter for fracture with routine healing: Secondary | ICD-10-CM

## 2016-10-24 DIAGNOSIS — M25532 Pain in left wrist: Secondary | ICD-10-CM | POA: Diagnosis not present

## 2016-10-24 NOTE — Progress Notes (Signed)
Office Visit Note   Patient: Sherri [name]           Date of Birth: [birth year]           MRN: [id number] Visit Date: 10/24/2016              Requested by: Dorothyann Pengobyn Sanders, MD [address] [place], [place] [id number] PCP: Gwynneth AlimentSANDERS,ROBYN N, MD   Assessment & Plan: Visit Diagnoses:  1. Pain in left wrist   2. Closed fracture of left wrist with routine healing, subsequent encounter     Plan: Continue to work on left wrist range of motion she can wean herself out of the wrist plan and should only use it if she is doing something that she is at risk for falling such as walking on wet leaves or ice.  Follow-Up Instructions: No Follow-up on file.   Orders:  Orders Placed This Encounter  Procedures  . XR Wrist 2 Views Left   No orders of the defined types were placed in this encounter.     Procedures: No procedures performed   Clinical Data: No additional findings.   Subjective: Chief Complaint  Patient presents with  . Left Wrist - Follow-up, Fracture    Patient is here for follow up ORIF of left distal radius fracture on 08/17/16.  She fell on 10/12/16 when walking out to her car. Her walker got stuck and she fell backwards. She did hit head, but did not get checked out.  She denies injury.  Left wrist seems to be doing okay.  She has a sharp pain every once in a while, but the wrist itself doesn't hurt. She wears brace when she needs it.  She is doing the exercises that she was taught in the system.  Patient is in pain management program. She tries to bear with the sharp pain when she has it.    Review of Systems unchanged from preop wrist fracture fixation 2 months ago.   Objective: Vital Signs: BP 101/64   Pulse 84   Ht 5\' 3"  (1.6 m)   Wt 139 lb 3.2 oz (63.1 kg)   LMP 07/29/2012   BMI 24.66 kg/m   Physical Exam  Constitutional: She is oriented to person, place, and time. She appears well-developed.  HENT:  Head: Normocephalic.  Right Ear:  External ear normal.  Left Ear: External ear normal.  Eyes: Pupils are equal, round, and reactive to light.  Neck: No tracheal deviation present. No thyromegaly present.  Cardiovascular: Normal rate.   Pulmonary/Chest: Effort normal.  Abdominal: Soft.  Neurological: She is alert and oriented to person, place, and time.  Skin: Skin is warm and dry.  Psychiatric: She has a normal mood and affect. Her behavior is normal.    Ortho Exam patient has a improvement in her foot drop. She is immature with a rolling walker. Left wrist volar incision post-ORIF is well healed. She has good wrist extension she has 50% wrist flexion ulnar and radial deviation are normal. She can continue to apply lotion work her way out of the wrist splint.  Specialty Comments:  No specialty comments available.  Imaging: No results found.   PMFS History: Patient Active Problem List   Diagnosis Date Noted  . Distal radius fracture, right 08/16/2016  . Postconcussion syndrome 12/30/2014  . Dizziness 12/18/2014  . Tremor 03/11/2014  . 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 Hiatal hernia 03/21/2013  . Anemia, iron deficiency 01/29/2013  . Spasm of muscle 09/02/2012  .  Benzodiazepine withdrawal (HCC) 08/30/2012  . CAP (community acquired pneumonia) 08/29/2012  . Hypernatremia 08/28/2012  . Metabolic acidosis 08/28/2012  . Leukocytosis 08/28/2012  . UTI (lower urinary tract infection) 08/28/2012  . Anemia 08/28/2012  . Acute back pain 08/27/2012  . Rhabdomyolysis 08/27/2012  . ARF (acute renal failure) (HCC) 08/27/2012  . Hypokalemia 08/27/2012  . Asthma 08/27/2012  . Chronic back pain   . Pernicious anemia 10/30/2011  . DEGENERATIVE DISC DISEASE 08/18/2010  . DEEP VEIN THROMBOSIS/PHLEBITIS 07/28/2010  . Asthma with bronchitis 07/28/2010  . URTICARIA 07/28/2010   Past Medical History:  Diagnosis Date  . Angioedema   . Anxiety   . Arthritis   . Asthma   . CAP (community acquired pneumonia) 08/29/2012    . Chronic back pain   . Chronic kidney disease   . Cocaine abuse   . Depression   . Diarrhea   . DVT (deep venous thrombosis) (HCC)   . Environmental allergies   . Fall   . GERD (gastroesophageal reflux disease)   . Heart murmur    has been told once that she has a heart murmur, but has never had any problems  . Hiatal hernia 03/21/2013  . Lumbar herniated disc   . Peripheral vascular disease (HCC)   . Pernicious anemia 10/30/2011  . Postconcussion syndrome 12/30/2014  . Seasonal allergies   . Stiff person syndrome   . Urticaria     Family History  Problem Relation Age of Onset  . Cancer Mother   . Other Mother   . COPD Father   . Asthma Brother   . Cancer Brother     colon  . Heart attack Brother   . Seizures Brother     Past Surgical History:  Procedure Laterality Date  . BUNIONECTOMY Left   . colonoscopy  07/09/15  . inguinal hernia 1983  1983  . OPEN REDUCTION INTERNAL FIXATION (ORIF) DISTAL RADIAL FRACTURE Right 08/16/2016   Procedure: OPEN REDUCTION INTERNAL FIXATION (ORIF) DISTAL RADIAL FRACTURE;  Surgeon: Eldred MangesMark C Eleanor Dimichele, MD;  Location: [facility] OR;  Service: Orthopedics;  Laterality: Right;   Social History   Occupational History  . disabled    Social History Main Topics  . Smoking status: Never Smoker  . Smokeless tobacco: Never Used     Comment: Never Used Tobacco  . Alcohol use 0.0 oz/week     Comment: Occasional  . Drug use:     Types: Cocaine     Comment: last use was 05/2016  . Sexual activity: Not on file

## 2016-11-21 DIAGNOSIS — Z79891 Long term (current) use of opiate analgesic: Secondary | ICD-10-CM | POA: Diagnosis not present

## 2016-11-21 DIAGNOSIS — M47816 Spondylosis without myelopathy or radiculopathy, lumbar region: Secondary | ICD-10-CM | POA: Diagnosis not present

## 2016-11-21 DIAGNOSIS — G894 Chronic pain syndrome: Secondary | ICD-10-CM | POA: Diagnosis not present

## 2016-11-21 DIAGNOSIS — M545 Low back pain: Secondary | ICD-10-CM | POA: Diagnosis not present

## 2016-11-21 DIAGNOSIS — Z79899 Other long term (current) drug therapy: Secondary | ICD-10-CM | POA: Diagnosis not present

## 2016-12-14 ENCOUNTER — Encounter (HOSPITAL_COMMUNITY): Payer: Self-pay

## 2016-12-14 ENCOUNTER — Ambulatory Visit (HOSPITAL_COMMUNITY)
Admission: EM | Admit: 2016-12-14 | Discharge: 2016-12-14 | Disposition: A | Discharge status: Home / Self Care | Payer: Medicare Other | Attending: Emergency Medicine | Admitting: Emergency Medicine

## 2016-12-14 ENCOUNTER — Emergency Department (HOSPITAL_COMMUNITY)
Admission: EM | Admit: 2016-12-14 | Discharge: 2016-12-14 | Disposition: A | Discharge status: Home / Self Care | Payer: Medicare Other | Attending: Emergency Medicine | Admitting: Emergency Medicine

## 2016-12-14 ENCOUNTER — Encounter (HOSPITAL_COMMUNITY): Payer: Self-pay | Admitting: Emergency Medicine

## 2016-12-14 DIAGNOSIS — R42 Dizziness and giddiness: Secondary | ICD-10-CM | POA: Insufficient documentation

## 2016-12-14 DIAGNOSIS — G2582 Stiff-man syndrome: Secondary | ICD-10-CM | POA: Diagnosis not present

## 2016-12-14 DIAGNOSIS — N390 Urinary tract infection, site not specified: Secondary | ICD-10-CM

## 2016-12-14 DIAGNOSIS — N189 Chronic kidney disease, unspecified: Secondary | ICD-10-CM | POA: Insufficient documentation

## 2016-12-14 DIAGNOSIS — Z79899 Other long term (current) drug therapy: Secondary | ICD-10-CM | POA: Insufficient documentation

## 2016-12-14 DIAGNOSIS — J45909 Unspecified asthma, uncomplicated: Secondary | ICD-10-CM | POA: Diagnosis not present

## 2016-12-14 LAB — CBC
HCT: 35.6 % — ABNORMAL LOW (ref 36.0–46.0)
Hemoglobin: 11.6 g/dL — ABNORMAL LOW (ref 12.0–15.0)
MCH: 27.8 pg (ref 26.0–34.0)
MCHC: 32.6 g/dL (ref 30.0–36.0)
MCV: 85.4 fL (ref 78.0–100.0)
Platelets: 244 10*3/uL (ref 150–400)
RBC: 4.17 MIL/uL (ref 3.87–5.11)
RDW: 12.8 % (ref 11.5–15.5)
WBC: 5.6 10*3/uL (ref 4.0–10.5)

## 2016-12-14 LAB — URINALYSIS, ROUTINE W REFLEX MICROSCOPIC
BILIRUBIN URINE: NEGATIVE
Glucose, UA: NEGATIVE mg/dL
Ketones, ur: NEGATIVE mg/dL
Nitrite: POSITIVE — AB
PH: 5 (ref 5.0–8.0)
Protein, ur: 30 mg/dL — AB
Specific Gravity, Urine: 1.026 (ref 1.005–1.030)

## 2016-12-14 LAB — BASIC METABOLIC PANEL
ANION GAP: 6 (ref 5–15)
BUN: 13 mg/dL (ref 6–20)
CHLORIDE: 107 mmol/L (ref 101–111)
CO2: 29 mmol/L (ref 22–32)
Calcium: 10.1 mg/dL (ref 8.9–10.3)
Creatinine, Ser: 1.36 mg/dL — ABNORMAL HIGH (ref 0.44–1.00)
GFR calc non Af Amer: 44 mL/min — ABNORMAL LOW (ref >60)
GFR, EST AFRICAN AMERICAN: 51 mL/min — AB (ref >60)
Glucose, Bld: 124 mg/dL — ABNORMAL HIGH (ref 65–99)
Potassium: 4.1 mmol/L (ref 3.5–5.1)
Sodium: 142 mmol/L (ref 135–145)

## 2016-12-14 LAB — RAPID URINE DRUG SCREEN, HOSP PERFORMED
Amphetamines: NOT DETECTED
BARBITURATES: NOT DETECTED
Benzodiazepines: POSITIVE — AB
Cocaine: NOT DETECTED
Opiates: POSITIVE — AB
Tetrahydrocannabinol: NOT DETECTED

## 2016-12-14 MED ORDER — SULFAMETHOXAZOLE-TRIMETHOPRIM 800-160 MG PO TABS
1.0000 | ORAL_TABLET | Freq: Once | ORAL | Status: AC
  Administered 2016-12-14: 1 via ORAL
  Filled 2016-12-14: qty 1

## 2016-12-14 MED ORDER — SULFAMETHOXAZOLE-TRIMETHOPRIM 800-160 MG PO TABS: 1.0000 | ORAL_TABLET | Freq: Two times a day (BID) | ORAL | 0 refills | Status: AC

## 2016-12-14 MED ORDER — MORPHINE SULFATE (PF) 4 MG/ML IV SOLN: 4.0000 mg | Freq: Once | INTRAVENOUS | Status: DC

## 2016-12-14 NOTE — ED Triage Notes (Signed)
[facility]. Pt states "I called my insurance nurse line and they told me to come to the ED immediately but I didn't until today" Pt states that she was up earlier today and bent over to pick something up off the floor and "felt really dizzy" Pt has hx of vertigo. Denies pain or any other sx. Neuro intact.
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
## 2016-12-14 NOTE — Discharge Instructions (Signed)
Urine shows that you have a urinary tract infection.  U been given a medication called Septra.  Please take it as directed 1 tablet twice a day for 7 days.  Please take this with a full glass of water.  Follow-up with your PCP for test of cure in about 2 weeks

## 2016-12-14 NOTE — ED Triage Notes (Signed)
C/o feeling dizzy onset x5 days that has been constant associated w/blurred vision  States sx increased today when she bend over to p/u some paper.   Had some left over diazepam Sunday... She called the nurse line on back of her ins card and was adv to go to ED  Pt refused and came here today  Denies HA,   Brought back on wheelchair... A&O x4... NAD

## 2016-12-14 NOTE — Discharge Instructions (Signed)
Transfer to ED for evaluation of dizziness in the presence of other neurologic disorders.

## 2016-12-14 NOTE — ED Provider Notes (Signed)
[facility]-EMERGENCY DEPT Provider Note   CSN: [id number] Arrival date & time: 12/14/16  1947     History   Chief Complaint Chief Complaint  Patient presents with  . Dizziness    HPI Sherri [name] is a 50 y.o. female.  This is a 50 year old female with a history of stiff hand syndrome who presents with 4 days of dizziness, which she holds her head in the downward or upward position, but not right to left.  She states she first noticed this at church when she was walking with her walker looking at the floor to keep her balance today became acutely worse when she bent over to pick up something from the floor.  She has a history of vertigo, but she states that this is a different sensation, not causing any nausea or blurry vision.  There was that seems to make it better is for her to lie down in bed Denies any nausea, vomiting, diarrhea, URI symptoms, dysuria, but she does endorse frequency.  No fever or chills, no headache      Past Medical History:  Diagnosis Date  . Angioedema   . Anxiety   . Arthritis   . Asthma   . CAP (community acquired pneumonia) 08/29/2012  . Chronic back pain   . Chronic kidney disease   . Cocaine abuse   . Depression   . Diarrhea   . DVT (deep venous thrombosis) (HCC)   . Environmental allergies   . Fall   . GERD (gastroesophageal reflux disease)   . Heart murmur    has been told once that she has a heart murmur, but has never had any problems  . Hiatal hernia 03/21/2013  . Lumbar herniated disc   . Peripheral vascular disease (HCC)   . Pernicious anemia 10/30/2011  . Postconcussion syndrome 12/30/2014  . Seasonal allergies   . Stiff person syndrome   . Urticaria     Patient Active Problem List   Diagnosis Date Noted  . Distal radius fracture, right 08/16/2016  . Postconcussion syndrome 12/30/2014  . Dizziness 12/18/2014  . Tremor 03/11/2014  . 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 Hiatal hernia 03/21/2013  . Anemia, iron deficiency  01/29/2013  . Spasm of muscle 09/02/2012  . Benzodiazepine withdrawal (HCC) 08/30/2012  . CAP (community acquired pneumonia) 08/29/2012  . Hypernatremia 08/28/2012  . Metabolic acidosis 08/28/2012  . Leukocytosis 08/28/2012  . UTI (lower urinary tract infection) 08/28/2012  . Anemia 08/28/2012  . Acute back pain 08/27/2012  . Rhabdomyolysis 08/27/2012  . ARF (acute renal failure) (HCC) 08/27/2012  . Hypokalemia 08/27/2012  . Asthma 08/27/2012  . Chronic back pain   . Pernicious anemia 10/30/2011  . DEGENERATIVE DISC DISEASE 08/18/2010  . DEEP VEIN THROMBOSIS/PHLEBITIS 07/28/2010  . Asthma with bronchitis 07/28/2010  . URTICARIA 07/28/2010    Past Surgical History:  Procedure Laterality Date  . BUNIONECTOMY Left   . colonoscopy  07/09/15  . inguinal hernia 1983  1983  . OPEN REDUCTION INTERNAL FIXATION (ORIF) DISTAL RADIAL FRACTURE Right 08/16/2016   Procedure: OPEN REDUCTION INTERNAL FIXATION (ORIF) DISTAL RADIAL FRACTURE;  Surgeon: Eldred MangesMark C Yates, MD;  Location: [facility] OR;  Service: Orthopedics;  Laterality: Right;    OB History    No data available       Home Medications    Prior to Admission medications   Medication Sig Start Date End Date Taking? Authorizing Provider  albuterol (PROVENTIL HFA;VENTOLIN HFA) 108 (90 BASE) MCG/ACT inhaler Inhale 1 puff into the lungs  every 6 (six) hours as needed. PRO AIR 06/20/13   Waymon Budge, MD  baclofen (LIORESAL) 20 MG tablet 1.5 tablets in the morning and evening, one tablet at midday 08/22/16   York Spaniel, MD  cholecalciferol (VITAMIN D) 1000 UNITS tablet Take 1,000 Units by mouth daily.    Historical Provider, MD  Cholecalciferol (VITAMIN D3) 5000 units CAPS Take by mouth.    Historical Provider, MD  cyanocobalamin (,VITAMIN B-12,) 1000 MCG/ML injection Inject 1,000 mcg into the muscle every 3 (three) months.     Historical Provider, MD  desloratadine (CLARINEX) 5 MG tablet Take 1 tablet (5 mg total) by mouth daily. 11/06/11    Waymon Budge, MD  diclofenac sodium (VOLTAREN) 1 % GEL Apply 2 g topically 4 (four) times daily. 10/02/14   Ozella Rocks, MD  diphenhydrAMINE (BENADRYL) 25 MG tablet Take 25 mg by mouth at bedtime as needed for itching or allergies.     Historical Provider, MD  EPIPEN 2-PAK 0.3 MG/0.3ML SOAJ injection USE AS DIRECTED FOR  SEVERE  ALLERGIC  REACTIONS 08/16/15   Waymon Budge, MD  FLUoxetine (PROZAC) 10 MG capsule Take 10 mg by mouth daily.    Historical Provider, MD  Fluticasone-Salmeterol (ADVAIR) 250-50 MCG/DOSE AEPB Inhale 1 puff into the lungs 2 (two) times daily.  03/14/12   Waymon Budge, MD  gabapentin (NEURONTIN) 300 MG capsule 2 capsules in the morning and evening, one capsule at midday 08/22/16   York Spaniel, MD  levocetirizine (XYZAL) 5 MG tablet Take 1 tablet (5 mg total) by mouth every evening. 11/06/11   Waymon Budge, MD  loratadine (CLARITIN) 10 MG tablet Take 10 mg by mouth daily.     Historical Provider, MD  montelukast (SINGULAIR) 10 MG tablet Take 10 mg by mouth at bedtime.    Historical Provider, MD  Morphine-Naltrexone (EMBEDA) 20-0.8 MG CPCR Take by mouth.    Historical Provider, MD  OVER THE COUNTER MEDICATION I B Guard    Historical Provider, MD  sulfamethoxazole-trimethoprim (BACTRIM DS,SEPTRA DS) 800-160 MG tablet Take 1 tablet by mouth 2 (two) times daily. 12/14/16 12/21/16  Earley Favor, NP  traMADol (ULTRAM) 50 MG tablet Take 50 mg by mouth every 6 (six) hours as needed for moderate pain.    Historical Provider, MD    Family History Family History  Problem Relation Age of Onset  . Cancer Mother   . Other Mother   . COPD Father   . Asthma Brother   . Cancer Brother     colon  . Heart attack Brother   . Seizures Brother     Social History Social History  Substance Use Topics  . Smoking status: Never Smoker  . Smokeless tobacco: Never Used     Comment: Never Used Tobacco  . Alcohol use 0.0 oz/week     Comment: Occasional     Allergies     Ibuprofen; Tylenol [acetaminophen]; Lemon flavor; and Amoxicillin   Review of Systems Review of Systems  Constitutional: Negative for chills and fever.  HENT: Negative for congestion and rhinorrhea.   Eyes: Negative for visual disturbance.  Respiratory: Negative for shortness of breath.   Cardiovascular: Negative for chest pain.  Gastrointestinal: Negative for abdominal pain.  Genitourinary: Positive for frequency. Negative for dysuria.  Musculoskeletal: Positive for arthralgias.  Skin: Negative for rash.  Neurological: Positive for dizziness. Negative for headaches.  All other systems reviewed and are negative.    Physical Exam Updated Vital  Signs BP 91/61   Pulse 61   Temp 98.2 F (36.8 C) (Oral)   Resp 16   Ht 5\' 3"  (1.6 m)   Wt 64.1 kg   LMP 07/29/2012   SpO2 97%   BMI 25.02 kg/m   Physical Exam  Constitutional: She is oriented to person, place, and time. She appears well-developed and well-nourished.  Eyes: Pupils are equal, round, and reactive to light.  Cardiovascular: Normal rate.   Pulmonary/Chest: Effort normal.  Neurological: She is alert and oriented to person, place, and time.  Neuro exam difficult due to stiff muscles  Skin: Skin is warm.  Psychiatric: She has a normal mood and affect.  Nursing note and vitals reviewed.    ED Treatments / Results  Labs (all labs ordered are listed, but only abnormal results are displayed) Labs Reviewed  BASIC METABOLIC PANEL - Abnormal; Notable for the following:       Result Value   Glucose, Bld 124 (*)    Creatinine, Ser 1.36 (*)    GFR calc non Af Amer 44 (*)    GFR calc Af Amer 51 (*)    All other components within normal limits  CBC - Abnormal; Notable for the following:    Hemoglobin 11.6 (*)    HCT 35.6 (*)    All other components within normal limits  URINALYSIS, ROUTINE W REFLEX MICROSCOPIC - Abnormal; Notable for the following:    APPearance HAZY (*)    Hgb urine dipstick MODERATE (*)     Protein, ur 30 (*)    Nitrite POSITIVE (*)    Leukocytes, UA MODERATE (*)    Bacteria, UA MANY (*)    Squamous Epithelial / LPF 0-5 (*)    All other components within normal limits  RAPID URINE DRUG SCREEN, HOSP PERFORMED - Abnormal; Notable for the following:    Opiates POSITIVE (*)    Benzodiazepines POSITIVE (*)    All other components within normal limits    EKG  EKG Interpretation None       Radiology No results found.  Procedures Procedures (including critical care time)  Medications Ordered in ED Medications  sulfamethoxazole-trimethoprim (BACTRIM DS,SEPTRA DS) 800-160 MG per tablet 1 tablet (not administered)     Initial Impression / Assessment and Plan / ED Course  I have reviewed the triage vital signs and the nursing notes.  Pertinent labs & imaging results that were available during my care of the patient were reviewed by me and considered in my medical decision making (see chart for details).  Clinical Course      Will Check urine, otherwise the rest of her exam is benign Salted, UA was discussed with patient along with antibiotic use and follow-up  Final Clinical Impressions(s) / ED Diagnoses   Final diagnoses:  Dizziness  Urinary tract infection without hematuria, site unspecified    New Prescriptions New Prescriptions   SULFAMETHOXAZOLE-TRIMETHOPRIM (BACTRIM DS,SEPTRA DS) 800-160 MG TABLET    Take 1 tablet by mouth 2 (two) times daily.     Earley FavorGail Stephannie Broner, NP 12/14/16 2156    Loren Raceravid Yelverton, MD 12/18/16 [phone]

## 2016-12-21 ENCOUNTER — Emergency Department (HOSPITAL_COMMUNITY): Payer: Medicare Other

## 2016-12-21 ENCOUNTER — Encounter (HOSPITAL_COMMUNITY): Payer: Self-pay

## 2016-12-21 ENCOUNTER — Telehealth: Payer: Self-pay | Admitting: Neurology

## 2016-12-21 ENCOUNTER — Emergency Department (HOSPITAL_COMMUNITY)
Admission: EM | Admit: 2016-12-21 | Discharge: 2016-12-21 | Disposition: A | Discharge status: Home / Self Care | Payer: Medicare Other | Attending: Emergency Medicine | Admitting: Emergency Medicine

## 2016-12-21 DIAGNOSIS — R404 Transient alteration of awareness: Secondary | ICD-10-CM | POA: Diagnosis not present

## 2016-12-21 DIAGNOSIS — N189 Chronic kidney disease, unspecified: Secondary | ICD-10-CM | POA: Diagnosis not present

## 2016-12-21 DIAGNOSIS — Z79899 Other long term (current) drug therapy: Secondary | ICD-10-CM | POA: Diagnosis not present

## 2016-12-21 DIAGNOSIS — R0602 Shortness of breath: Secondary | ICD-10-CM | POA: Diagnosis not present

## 2016-12-21 DIAGNOSIS — R5383 Other fatigue: Secondary | ICD-10-CM | POA: Diagnosis not present

## 2016-12-21 DIAGNOSIS — R531 Weakness: Secondary | ICD-10-CM | POA: Diagnosis not present

## 2016-12-21 DIAGNOSIS — J45909 Unspecified asthma, uncomplicated: Secondary | ICD-10-CM | POA: Insufficient documentation

## 2016-12-21 LAB — URINALYSIS, ROUTINE W REFLEX MICROSCOPIC
BILIRUBIN URINE: NEGATIVE
Glucose, UA: NEGATIVE mg/dL
Hgb urine dipstick: NEGATIVE
Ketones, ur: NEGATIVE mg/dL
Nitrite: NEGATIVE
PH: 7 (ref 5.0–8.0)
Protein, ur: NEGATIVE mg/dL
Specific Gravity, Urine: 1.014 (ref 1.005–1.030)

## 2016-12-21 LAB — BASIC METABOLIC PANEL
Anion gap: 8 (ref 5–15)
BUN: 12 mg/dL (ref 6–20)
CALCIUM: 10.2 mg/dL (ref 8.9–10.3)
CO2: 25 mmol/L (ref 22–32)
CREATININE: 1.44 mg/dL — AB (ref 0.44–1.00)
Chloride: 106 mmol/L (ref 101–111)
GFR calc Af Amer: 47 mL/min — ABNORMAL LOW (ref >60)
GFR, EST NON AFRICAN AMERICAN: 41 mL/min — AB (ref >60)
GLUCOSE: 107 mg/dL — AB (ref 65–99)
POTASSIUM: 4.3 mmol/L (ref 3.5–5.1)
Sodium: 139 mmol/L (ref 135–145)

## 2016-12-21 LAB — CBC
HCT: 36.4 % (ref 36.0–46.0)
Hemoglobin: 12.4 g/dL (ref 12.0–15.0)
MCH: 28.4 pg (ref 26.0–34.0)
MCHC: 34.1 g/dL (ref 30.0–36.0)
MCV: 83.3 fL (ref 78.0–100.0)
Platelets: 259 10*3/uL (ref 150–400)
RBC: 4.37 MIL/uL (ref 3.87–5.11)
RDW: 12.8 % (ref 11.5–15.5)
WBC: 5.2 10*3/uL (ref 4.0–10.5)

## 2016-12-21 MED ORDER — SODIUM CHLORIDE 0.9 % IV BOLUS (SEPSIS)
1000.0000 mL | Freq: Once | INTRAVENOUS | Status: AC
  Administered 2016-12-21: 1000 mL via INTRAVENOUS

## 2016-12-21 NOTE — ED Notes (Signed)
Ambulated pt with walker to bathroom in room, no issues. Pt back on monitor.

## 2016-12-21 NOTE — ED Provider Notes (Signed)
[facility] Provider Note   CSN: [id number] Arrival date & time: 12/21/16  1046     History   Chief Complaint Chief Complaint  Patient presents with  . Weakness    HPI Sherri [name] is a 50 y.o. female.  Patient presents emergency department with chief complaint of generalized weakness. She states that she is currently being treated for UTI, and has felt increasingly weak. She states that she also has felt slightly dizzy. She does have a history of vertigo. She is prescribed meclizine for this by her primary care provider. She has been taking antibiotics for her UTI. She denies any recent fevers or chills. Denies any nausea, vomiting, or diarrhea. There are no other associated symptoms. There are no modifying factors.   The history is provided by the patient. No language interpreter was used.    Past Medical History:  Diagnosis Date  . Angioedema   . Anxiety   . Arthritis   . Asthma   . CAP (community acquired pneumonia) 08/29/2012  . Chronic back pain   . Chronic kidney disease   . Cocaine abuse   . Depression   . Diarrhea   . DVT (deep venous thrombosis) (HCC)   . Environmental allergies   . Fall   . GERD (gastroesophageal reflux disease)   . Heart murmur    has been told once that she has a heart murmur, but has never had any problems  . Hiatal hernia 03/21/2013  . Lumbar herniated disc   . Peripheral vascular disease (HCC)   . Pernicious anemia 10/30/2011  . Postconcussion syndrome 12/30/2014  . Seasonal allergies   . Stiff person syndrome   . Urticaria     Patient Active Problem List   Diagnosis Date Noted  . Distal radius fracture, right 08/16/2016  . Postconcussion syndrome 12/30/2014  . Dizziness 12/18/2014  . Tremor 03/11/2014  . 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 Hiatal hernia 03/21/2013  . Anemia, iron deficiency 01/29/2013  . Spasm of muscle 09/02/2012  . Benzodiazepine withdrawal (HCC) 08/30/2012  . CAP (community acquired pneumonia)  08/29/2012  . Hypernatremia 08/28/2012  . Metabolic acidosis 08/28/2012  . Leukocytosis 08/28/2012  . UTI (lower urinary tract infection) 08/28/2012  . Anemia 08/28/2012  . Acute back pain 08/27/2012  . Rhabdomyolysis 08/27/2012  . ARF (acute renal failure) (HCC) 08/27/2012  . Hypokalemia 08/27/2012  . Asthma 08/27/2012  . Chronic back pain   . Pernicious anemia 10/30/2011  . DEGENERATIVE DISC DISEASE 08/18/2010  . DEEP VEIN THROMBOSIS/PHLEBITIS 07/28/2010  . Asthma with bronchitis 07/28/2010  . URTICARIA 07/28/2010    Past Surgical History:  Procedure Laterality Date  . BUNIONECTOMY Left   . colonoscopy  07/09/15  . inguinal hernia 1983  1983  . OPEN REDUCTION INTERNAL FIXATION (ORIF) DISTAL RADIAL FRACTURE Right 08/16/2016   Procedure: OPEN REDUCTION INTERNAL FIXATION (ORIF) DISTAL RADIAL FRACTURE;  Surgeon: Eldred MangesMark C Yates, MD;  Location: [facility] OR;  Service: Orthopedics;  Laterality: Right;    OB History    No data available       Home Medications    Prior to Admission medications   Medication Sig Start Date End Date Taking? Authorizing Provider  albuterol (PROVENTIL HFA;VENTOLIN HFA) 108 (90 BASE) MCG/ACT inhaler Inhale 1 puff into the lungs every 6 (six) hours as needed. PRO AIR 06/20/13   Waymon Budgelinton D Young, MD  baclofen (LIORESAL) 20 MG tablet 1.5 tablets in the morning and evening, one tablet at midday 08/22/16   York Spanielharles K Willis,  MD  cholecalciferol (VITAMIN D) 1000 UNITS tablet Take 1,000 Units by mouth daily.    Historical Provider, MD  Cholecalciferol (VITAMIN D3) 5000 units CAPS Take by mouth.    Historical Provider, MD  cyanocobalamin (,VITAMIN B-12,) 1000 MCG/ML injection Inject 1,000 mcg into the muscle every 3 (three) months.     Historical Provider, MD  desloratadine (CLARINEX) 5 MG tablet Take 1 tablet (5 mg total) by mouth daily. 11/06/11   Waymon Budge, MD  diclofenac sodium (VOLTAREN) 1 % GEL Apply 2 g topically 4 (four) times daily. 10/02/14   Ozella Rocks,  MD  diphenhydrAMINE (BENADRYL) 25 MG tablet Take 25 mg by mouth at bedtime as needed for itching or allergies.     Historical Provider, MD  EPIPEN 2-PAK 0.3 MG/0.3ML SOAJ injection USE AS DIRECTED FOR  SEVERE  ALLERGIC  REACTIONS 08/16/15   Waymon Budge, MD  FLUoxetine (PROZAC) 10 MG capsule Take 10 mg by mouth daily.    Historical Provider, MD  Fluticasone-Salmeterol (ADVAIR) 250-50 MCG/DOSE AEPB Inhale 1 puff into the lungs 2 (two) times daily.  03/14/12   Waymon Budge, MD  gabapentin (NEURONTIN) 300 MG capsule 2 capsules in the morning and evening, one capsule at midday 08/22/16   York Spaniel, MD  levocetirizine (XYZAL) 5 MG tablet Take 1 tablet (5 mg total) by mouth every evening. 11/06/11   Waymon Budge, MD  loratadine (CLARITIN) 10 MG tablet Take 10 mg by mouth daily.     Historical Provider, MD  montelukast (SINGULAIR) 10 MG tablet Take 10 mg by mouth at bedtime.    Historical Provider, MD  Morphine-Naltrexone (EMBEDA) 20-0.8 MG CPCR Take by mouth.    Historical Provider, MD  OVER THE COUNTER MEDICATION I B Guard    Historical Provider, MD  sulfamethoxazole-trimethoprim (BACTRIM DS,SEPTRA DS) 800-160 MG tablet Take 1 tablet by mouth 2 (two) times daily. 12/14/16 12/21/16  Earley Favor, NP  traMADol (ULTRAM) 50 MG tablet Take 50 mg by mouth every 6 (six) hours as needed for moderate pain.    Historical Provider, MD    Family History Family History  Problem Relation Age of Onset  . Cancer Mother   . Other Mother   . COPD Father   . Asthma Brother   . Cancer Brother     colon  . Heart attack Brother   . Seizures Brother     Social History Social History  Substance Use Topics  . Smoking status: Never Smoker  . Smokeless tobacco: Never Used     Comment: Never Used Tobacco  . Alcohol use 0.0 oz/week     Comment: Occasional     Allergies   Ibuprofen; Tylenol [acetaminophen]; Lemon flavor; and Amoxicillin   Review of Systems Review of Systems  All other systems  reviewed and are negative.    Physical Exam Updated Vital Signs BP 129/89   Pulse 71   Temp 98 F (36.7 C) (Oral)   Resp 15   Ht 5\' 3"  (1.6 m)   Wt 62.1 kg   LMP 07/29/2012   SpO2 100%   BMI 24.27 kg/m   Physical Exam  Constitutional: She is oriented to person, place, and time. She appears well-developed and well-nourished.  HENT:  Head: Normocephalic and atraumatic.  Eyes: Conjunctivae and EOM are normal. Pupils are equal, round, and reactive to light.  Neck: Normal range of motion. Neck supple.  Cardiovascular: Normal rate and regular rhythm.  Exam reveals no gallop and  no friction rub.   No murmur heard. Pulmonary/Chest: Effort normal and breath sounds normal. No respiratory distress. She has no wheezes. She has no rales. She exhibits no tenderness.  Abdominal: Soft. Bowel sounds are normal. She exhibits no distension and no mass. There is no tenderness. There is no rebound and no guarding.  Musculoskeletal: Normal range of motion. She exhibits no edema or tenderness.  Neurological: She is alert and oriented to person, place, and time.  Skin: Skin is warm and dry.  Psychiatric: She has a normal mood and affect. Her behavior is normal. Judgment and thought content normal.  Nursing note and vitals reviewed.    ED Treatments / Results  Labs (all labs ordered are listed, but only abnormal results are displayed) Labs Reviewed  BASIC METABOLIC PANEL - Abnormal; Notable for the following:       Result Value   Glucose, Bld 107 (*)    Creatinine, Ser 1.44 (*)    GFR calc non Af Amer 41 (*)    GFR calc Af Amer 47 (*)    All other components within normal limits  URINALYSIS, ROUTINE W REFLEX MICROSCOPIC - Abnormal; Notable for the following:    Leukocytes, UA LARGE (*)    Bacteria, UA FEW (*)    Squamous Epithelial / LPF 0-5 (*)    All other components within normal limits  CBC    EKG  EKG Interpretation None       Radiology Dg Chest 2 View  Result Date:  12/21/2016 CLINICAL DATA:  Shortness of breath, dizziness EXAM: CHEST  2 VIEW COMPARISON:  11/19/2015 FINDINGS: Moderate-sized hiatal hernia. Heart is normal size. No confluent airspace opacities or effusions. No acute bony abnormality. IMPRESSION: Moderate-sized hiatal hernia.  No active disease. Electronically Signed   By: Charlett NoseKevin  Dover M.D.   On: 12/21/2016 11:58    Procedures Procedures (including critical care time)  Medications Ordered in ED Medications  sodium chloride 0.9 % bolus 1,000 mL (0 mLs Intravenous Stopped 12/21/16 1317)     Initial Impression / Assessment and Plan / ED Course  I have reviewed the triage vital signs and the nursing notes.  Pertinent labs & imaging results that were available during my care of the patient were reviewed by me and considered in my medical decision making (see chart for details).  Clinical Course     Patient with generalized fatigue/weakness. She has been being treated for UTI. Her urinalysis looks improved today. Have advised her to continue using the antibiotic. Her vital signs are stable. She is generally well-appearing. She is in take. Her creatinine is elevated to 1.44, this is up from her baseline. I suspect that she might be a little dehydrated, and have encouraged her to continue fluids. I recommend that she have her creatinine reassessed by primary care provider in one week. Patient feels improved after the fluids on the emergency department.  I believe patient to be appropriate for discharge and outpatient follow-up at this time.  Return precautions discussed.    Final Clinical Impressions(s) / ED Diagnoses   Final diagnoses:  Other fatigue    New Prescriptions New Prescriptions   No medications on file     Roxy HorsemanRobert Danni Leabo, PA-C 12/21/16 1427    Margarita Grizzleanielle Ray, MD 12/21/16 [phone]

## 2016-12-21 NOTE — ED Triage Notes (Signed)
Pt presents for evaluation of generalized weakness starting today. Pt. Recently seen for dizziness on 12/21 and dx with UTI. Pt. Reports taking abx as prescribed. Pt. AxO 4. No focal neurological deficits.

## 2016-12-21 NOTE — Discharge Instructions (Signed)
You need to continue drinking plenty of fluids.  You need to have your kidney function rechecked by your primary care provider in 1 week.  Please return to the ER if your symptoms worsen.

## 2016-12-21 NOTE — ED Notes (Signed)
Per PA ok to give pt Mal[place] sandwich and ginger ale.

## 2016-12-21 NOTE — Telephone Encounter (Signed)
Pt called to advise she is having some spasticity and stiffness in her legs. She said her PCP is admitting her to the hospital. She will go by ambulance. Pt said when she checked out in May she was told a follow up appt could not be scheduled until a past balance of $30 was paid. She wanted to know if Dr Lacretia NicksW could come to the hospital and see her for f/u there. She was advised he would not be able to do a f/u visit for the clinic while she was there that would need to be done in the clinic.   FYI

## 2016-12-26 ENCOUNTER — Other Ambulatory Visit (HOSPITAL_BASED_OUTPATIENT_CLINIC_OR_DEPARTMENT_OTHER): Payer: Medicare Other

## 2016-12-26 ENCOUNTER — Ambulatory Visit (HOSPITAL_BASED_OUTPATIENT_CLINIC_OR_DEPARTMENT_OTHER): Payer: Medicare Other

## 2016-12-26 ENCOUNTER — Ambulatory Visit (HOSPITAL_BASED_OUTPATIENT_CLINIC_OR_DEPARTMENT_OTHER): Payer: Medicare Other | Admitting: Hematology & Oncology

## 2016-12-26 VITALS — BP 124/74 | HR 106 | Temp 98.2°F

## 2016-12-26 DIAGNOSIS — D509 Iron deficiency anemia, unspecified: Secondary | ICD-10-CM

## 2016-12-26 DIAGNOSIS — D510 Vitamin B12 deficiency anemia due to intrinsic factor deficiency: Secondary | ICD-10-CM

## 2016-12-26 DIAGNOSIS — E559 Vitamin D deficiency, unspecified: Secondary | ICD-10-CM | POA: Diagnosis not present

## 2016-12-26 DIAGNOSIS — D500 Iron deficiency anemia secondary to blood loss (chronic): Secondary | ICD-10-CM

## 2016-12-26 LAB — CMP (CANCER CENTER ONLY)
ALT(SGPT): 21 U/L (ref 10–47)
AST: 28 U/L (ref 11–38)
Albumin: 4.3 g/dL (ref 3.3–5.5)
Alkaline Phosphatase: 91 U/L — ABNORMAL HIGH (ref 26–84)
BUN, Bld: 16 mg/dL (ref 7–22)
CHLORIDE: 106 meq/L (ref 98–108)
CO2: 26 mEq/L (ref 18–33)
CREATININE: 1.1 mg/dL (ref 0.6–1.2)
Calcium: 10.2 mg/dL (ref 8.0–10.3)
Glucose, Bld: 157 mg/dL — ABNORMAL HIGH (ref 73–118)
POTASSIUM: 4 meq/L (ref 3.3–4.7)
Sodium: 143 mEq/L (ref 128–145)
Total Bilirubin: 0.6 mg/dl (ref 0.20–1.60)
Total Protein: 8.2 g/dL — ABNORMAL HIGH (ref 6.4–8.1)

## 2016-12-26 LAB — CBC WITH DIFFERENTIAL (CANCER CENTER ONLY)
BASO#: 0 10*3/uL (ref 0.0–0.2)
BASO%: 0 % (ref 0.0–2.0)
EOS%: 0 % (ref 0.0–7.0)
Eosinophils Absolute: 0 10*3/uL (ref 0.0–0.5)
HCT: 37.5 % (ref 34.8–46.6)
HEMOGLOBIN: 12.5 g/dL (ref 11.6–15.9)
LYMPH#: 1.4 10*3/uL (ref 0.9–3.3)
LYMPH%: 19 % (ref 14.0–48.0)
MCH: 28.7 pg (ref 26.0–34.0)
MCHC: 33.3 g/dL (ref 32.0–36.0)
MCV: 86 fL (ref 81–101)
MONO#: 0.3 10*3/uL (ref 0.1–0.9)
MONO%: 3.8 % (ref 0.0–13.0)
NEUT#: 5.7 10*3/uL (ref 1.5–6.5)
NEUT%: 77.2 % (ref 39.6–80.0)
Platelets: 265 10*3/uL (ref 145–400)
RBC: 4.35 10*6/uL (ref 3.70–5.32)
RDW: 12.7 % (ref 11.1–15.7)
WBC: 7.4 10*3/uL (ref 3.9–10.0)

## 2016-12-26 LAB — IRON AND TIBC
%SAT: 20 % — AB (ref 21–57)
IRON: 50 ug/dL (ref 41–142)
TIBC: 256 ug/dL (ref 236–444)
UIBC: 206 ug/dL (ref 120–384)

## 2016-12-26 LAB — FERRITIN: Ferritin: 542 ng/ml — ABNORMAL HIGH (ref 9–269)

## 2016-12-26 MED ORDER — CYANOCOBALAMIN 1000 MCG/ML IJ SOLN
INTRAMUSCULAR | Status: AC
  Filled 2016-12-26: qty 1

## 2016-12-26 MED ORDER — CYANOCOBALAMIN 1000 MCG/ML IJ SOLN
1000.0000 ug | Freq: Once | INTRAMUSCULAR | Status: AC
  Administered 2016-12-26: 1000 ug via INTRAMUSCULAR

## 2016-12-26 NOTE — Patient Instructions (Signed)
Cyanocobalamin, Vitamin B12 injection What is this medicine? CYANOCOBALAMIN (sye an oh koe BAL a min) is a man made form of vitamin B12. Vitamin B12 is used in the growth of healthy blood cells, nerve cells, and proteins in the body. It also helps with the metabolism of fats and carbohydrates. This medicine is used to treat people who can not absorb vitamin B12. This medicine may be used for other purposes; ask your health care provider or pharmacist if you have questions. COMMON BRAND NAME(S): B-12 Compliance Kit, B-12 Injection Kit, Cyomin, LA-12, Nutri-Twelve, Physicians EZ Use B-12, Primabalt What should I tell my health care provider before I take this medicine? They need to know if you have any of these conditions: -kidney disease -Leber's disease -megaloblastic anemia -an unusual or allergic reaction to cyanocobalamin, cobalt, other medicines, foods, dyes, or preservatives -pregnant or trying to get pregnant -breast-feeding How should I use this medicine? This medicine is injected into a muscle or deeply under the skin. It is usually given by a health care professional in a clinic or doctor's office. However, your doctor may teach you how to inject yourself. Follow all instructions. Talk to your pediatrician regarding the use of this medicine in children. Special care may be needed. Overdosage: If you think you have taken too much of this medicine contact a poison control center or emergency room at once. NOTE: This medicine is only for you. Do not share this medicine with others. What if I miss a dose? If you are given your dose at a clinic or doctor's office, call to reschedule your appointment. If you give your own injections and you miss a dose, take it as soon as you can. If it is almost time for your next dose, take only that dose. Do not take double or extra doses. What may interact with this medicine? -colchicine -heavy alcohol intake This list may not describe all possible  interactions. Give your health care provider a list of all the medicines, herbs, non-prescription drugs, or dietary supplements you use. Also tell them if you smoke, drink alcohol, or use illegal drugs. Some items may interact with your medicine. What should I watch for while using this medicine? Visit your doctor or health care professional regularly. You may need blood work done while you are taking this medicine. You may need to follow a special diet. Talk to your doctor. Limit your alcohol intake and avoid smoking to get the best benefit. What side effects may I notice from receiving this medicine? Side effects that you should report to your doctor or health care professional as soon as possible: -allergic reactions like skin rash, itching or hives, swelling of the face, lips, or tongue -blue tint to skin -chest tightness, pain -difficulty breathing, wheezing -dizziness -red, swollen painful area on the leg Side effects that usually do not require medical attention (report to your doctor or health care professional if they continue or are bothersome): -diarrhea -headache This list may not describe all possible side effects. Call your doctor for medical advice about side effects. You may report side effects to FDA at 1-800-FDA-1088. Where should I keep my medicine? Keep out of the reach of children. Store at room temperature between 15 and 30 degrees C (59 and 85 degrees F). Protect from light. Throw away any unused medicine after the expiration date. NOTE: This sheet is a summary. It may not cover all possible information. If you have questions about this medicine, talk to your doctor, pharmacist, or   health care provider.  2017 Elsevier/Gold Standard (2008-03-23 22:10:20)  

## 2016-12-26 NOTE — Progress Notes (Signed)
Hematology and Oncology Follow Up Visit  Sherri [name] [id number] [birth year] 51 y.o. 12/26/2016   Principle Diagnosis:  1. Pernicious anemia - anti-intrinsic factor antibodies.  2. Intermittent iron-deficiency anemia.  4. 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   Current Therapy:   1. Vitamin B12, 1 mg IM q.4 months.  2. IV iron as indicated - last received in October 2016    Interim History: Sherri [name] is here for a follow-up today. She is now in a wheelchair. 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 She broke her left wrist back in August. She needed surgery for this. She has a brace on the left forearm.  She cannot see neurology because they say she owes them $30. I gave her $30 so that she can see neurology to try to help with the stiff person syndrome. I think it is important that neurology be involved with her care.   She has a new grandchild. It is a girl. Her daughter is now married.   Back in August, her ferritin was 573 with iron saturation of 27%.   She enjoys the cold weather that we are having.   She does well with her B-12 injections.   Overall, her performance status is ECOG 2.  Medications:  Allergies as of 12/26/2016      Reactions   Ibuprofen Other (See Comments)   Does not take due to hx of renal insufficiency   Tylenol [acetaminophen] Hives   Cannot take large quantities   Lemon Flavor Swelling   Severe Lip Swelling    Amoxicillin Diarrhea      Medication List       Accurate as of 12/26/16 11:06 AM. Always use your most recent med list.          albuterol 108 (90 Base) MCG/ACT inhaler Commonly known as:  PROVENTIL HFA;VENTOLIN HFA Inhale 1 puff into the lungs every 6 (six) hours as needed. PRO AIR   baclofen 20 MG tablet Commonly known as:  LIORESAL 1.5 tablets in the morning and evening, one tablet at midday   cholecalciferol 1000 units tablet Commonly known as:  VITAMIN D Take 1,000 Units by mouth daily.   Vitamin D3 5000 units Caps Take by mouth.   cyanocobalamin 1000 MCG/ML injection Commonly known as:  (VITAMIN B-12) Inject 1,000 mcg into the muscle every 3 (three) months.   desloratadine 5 MG tablet Commonly known as:  CLARINEX Take 1 tablet (5 mg total) by mouth daily.   diclofenac sodium 1 % Gel Commonly known as:  VOLTAREN Apply 2 g topically 4 (four) times daily.   diphenhydrAMINE 25 MG tablet Commonly known as:  BENADRYL Take 25 mg by mouth at bedtime as needed for itching or allergies.   EMBEDA 20-0.8 MG Cpcr Generic drug:  Morphine-Naltrexone Take by mouth.   EPIPEN 2-PAK 0.3 mg/0.3 mL Soaj injection Generic drug:  EPINEPHrine USE AS DIRECTED FOR  SEVERE  ALLERGIC  REACTIONS   FLUoxetine 10 MG capsule Commonly known as:  PROZAC Take 10 mg by mouth daily.   Fluticasone-Salmeterol 250-50 MCG/DOSE Aepb Commonly known as:  ADVAIR Inhale 1 puff into the lungs 2 (two) times daily.   gabapentin 300 MG capsule Commonly known as:  NEURONTIN 2 capsules in the morning and evening, one capsule at midday   levocetirizine 5 MG tablet Commonly known as:  XYZAL Take 1 tablet (5 mg total) by mouth every evening.   loratadine 10 MG tablet Commonly known as:  CLARITIN Take 10 mg by mouth daily.   montelukast 10 MG tablet Commonly known as:  SINGULAIR Take 10 mg by mouth at bedtime.   OVER THE COUNTER MEDICATION I B Guard   traMADol 50 MG tablet Commonly known as:  ULTRAM Take 50 mg by mouth every 6 (six) hours as needed for moderate pain.       Allergies:  Allergies  Allergen Reactions  . Ibuprofen Other (See Comments)    Does not take due to hx of renal insufficiency  . Tylenol [Acetaminophen] Hives    Cannot take large quantities  . Lemon Flavor Swelling    Severe Lip Swelling   . Amoxicillin Diarrhea    Past Medical History, Surgical history, Social history, and Family History were reviewed and updated.  Review of Systems: All other 10 point  review of systems is negative.   Physical Exam:  vitals were not taken for this visit.  Wt Readings from Last 3 Encounters:  12/21/16 137 lb (62.1 kg)  12/14/16 141 lb 4 oz (64.1 kg)  10/24/16 139 lb 3.2 oz (63.1 kg)    Ocular: Sclerae unicteric, pupils equal, round and reactive to light Ear-nose-throat: Oropharynx clear, dentition fair Lymphatic: No cervical supraclavicular or axillary adenopathy Lungs no rales or rhonchi, good excursion bilaterally Heart regular rate and rhythm, no murmur appreciated Abd soft, nontender, positive bowel sounds, no liver or spleen tip palpated on exam, no fluid wave MSK no focal spinal tenderness, no joint edema Neuro: non-focal, well-oriented, appropriate affect Breasts: Deferred  Lab Results  Component Value Date   WBC 7.4 12/26/2016   HGB 12.5 12/26/2016   HCT 37.5 12/26/2016   MCV 86 12/26/2016   PLT 265 12/26/2016   Lab Results  Component Value Date   FERRITIN 573 (H) 08/24/2016   IRON 62 08/24/2016   TIBC 231 (L) 08/24/2016   UIBC 169 08/24/2016   IRONPCTSAT 27 08/24/2016   Lab Results  Component Value Date   RETICCTPCT 1.8 12/14/2015   RBC 4.35 12/26/2016   RETICCTABS 71.8 12/14/2015   Lab Results  Component Value Date   KPAFRELGTCHN 1.06 08/02/2011   LAMBDASER 1.49 08/02/2011   KAPLAMBRATIO 0.71 08/02/2011   Lab Results  Component Value Date   IGGSERUM 1180 08/02/2011   IGA 206 08/02/2011   IGMSERUM 198 08/02/2011   Lab Results  Component Value Date   TOTALPROTELP 6.6 08/02/2011   ALBUMINELP 57.1 08/02/2011   A1GS 4.1 08/02/2011   A2GS 9.6 08/02/2011   BETS 6.1 08/02/2011   BETA2SER 5.9 08/02/2011   GAMS 17.2 08/02/2011   MSPIKE NOT DET 08/02/2011   SPEI * 08/02/2011     Chemistry      Component Value Date/Time   NA 143 12/26/2016 1002   NA 143 08/24/2016 1004   K 4.0 12/26/2016 1002   K 3.7 08/24/2016 1004   CL 106 12/26/2016 1002   CO2 26 12/26/2016 1002   CO2 26 08/24/2016 1004   BUN 16  12/26/2016 1002   BUN 10.8 08/24/2016 1004   CREATININE 1.1 12/26/2016 1002   CREATININE 1.2 (H) 08/24/2016 1004      Component Value Date/Time   CALCIUM 10.2 12/26/2016 1002   CALCIUM 9.9 08/24/2016 1004   ALKPHOS 91 (H) 12/26/2016 1002   ALKPHOS 137 08/24/2016 1004   AST 28 12/26/2016 1002   AST 14 08/24/2016 1004   ALT 21 12/26/2016 1002   ALT 11 08/24/2016 1004   BILITOT 0.60 12/26/2016 1002   BILITOT 0.32  08/24/2016 1004     Impression and Plan: Ms. Prigmore is 51 yo African American female with pernicious anemia and intermittent iron deficiency anemia. She is doing well recuperating from a  fall that ended with a broken left wrist. She has a brace on and is follow-up with ortho today.  We will proceed with her B 12 injection today as planned.  We will see what her iron studies show and bring her back in next week for an infusion if needed.  We will plan to see her back in 2 months for labs and follow-up.  She is knows to call here with any questions or concerns. We can certainly see her sooner if need be.   Josph Macho, MD 1/2/201811:06 AM

## 2016-12-27 ENCOUNTER — Telehealth (INDEPENDENT_AMBULATORY_CARE_PROVIDER_SITE_OTHER): Payer: Self-pay | Admitting: Orthopaedic Surgery

## 2016-12-27 LAB — RETICULOCYTES: Reticulocyte Count: 1.3 % (ref 0.6–2.6)

## 2016-12-27 LAB — VITAMIN B12: Vitamin B12: 438 pg/mL (ref 232–1245)

## 2016-12-27 LAB — VITAMIN D 25 HYDROXY (VIT D DEFICIENCY, FRACTURES): Vitamin D, 25-Hydroxy: 74 ng/mL (ref 30.0–100.0)

## 2016-12-27 NOTE — Telephone Encounter (Signed)
Pt is asking if she needs to come back here to be seen for back spasms or if she needs to go to her neurologist for this. Pt number is [phone]

## 2016-12-27 NOTE — Telephone Encounter (Signed)
She can see her neurologist who is treating her. Seen multiple times by us,  surgery declined.  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

## 2016-12-27 NOTE — Telephone Encounter (Signed)
Would you like for me to make patient return office visit with you? Last seen for her back with you in 2016.

## 2016-12-28 NOTE — Telephone Encounter (Signed)
I called patient and left message advising.

## 2016-12-29 ENCOUNTER — Telehealth: Payer: Self-pay | Admitting: *Deleted

## 2016-12-29 NOTE — Telephone Encounter (Addendum)
Patient aware of results. Appointment scheduled.   ----- Message from Verdie MosherSarah M [place], NP sent at 12/28/2016 10:55 AM EST ----- Regarding: iron  Iron saturation is low. She will need one dose of IV iron schedule please. Thank you!  Sarah  ----- Message ----- From: Interface, Lab In Three Zero One Sent: 12/26/2016  10:17 AM To: Verdie MosherSarah M [place], NP

## 2016-12-31 ENCOUNTER — Emergency Department (HOSPITAL_COMMUNITY): Payer: Medicare Other

## 2016-12-31 ENCOUNTER — Encounter (HOSPITAL_COMMUNITY): Payer: Self-pay

## 2016-12-31 ENCOUNTER — Emergency Department (HOSPITAL_COMMUNITY)
Admission: EM | Admit: 2016-12-31 | Discharge: 2016-12-31 | Disposition: A | Discharge status: Home / Self Care | Payer: Medicare Other | Attending: Emergency Medicine | Admitting: Emergency Medicine

## 2016-12-31 DIAGNOSIS — R197 Diarrhea, unspecified: Secondary | ICD-10-CM

## 2016-12-31 DIAGNOSIS — R1310 Dysphagia, unspecified: Secondary | ICD-10-CM | POA: Insufficient documentation

## 2016-12-31 DIAGNOSIS — R0989 Other specified symptoms and signs involving the circulatory and respiratory systems: Secondary | ICD-10-CM | POA: Diagnosis not present

## 2016-12-31 DIAGNOSIS — J45909 Unspecified asthma, uncomplicated: Secondary | ICD-10-CM | POA: Insufficient documentation

## 2016-12-31 LAB — CBC
HCT: 36.5 % (ref 36.0–46.0)
Hemoglobin: 12.4 g/dL (ref 12.0–15.0)
MCH: 28.9 pg (ref 26.0–34.0)
MCHC: 34 g/dL (ref 30.0–36.0)
MCV: 85.1 fL (ref 78.0–100.0)
PLATELETS: 268 10*3/uL (ref 150–400)
RBC: 4.29 MIL/uL (ref 3.87–5.11)
RDW: 13 % (ref 11.5–15.5)
WBC: 6.5 10*3/uL (ref 4.0–10.5)

## 2016-12-31 LAB — BASIC METABOLIC PANEL
Anion gap: 8 (ref 5–15)
BUN: 6 mg/dL (ref 6–20)
CALCIUM: 10.3 mg/dL (ref 8.9–10.3)
CO2: 26 mmol/L (ref 22–32)
Chloride: 108 mmol/L (ref 101–111)
Creatinine, Ser: 1.2 mg/dL — ABNORMAL HIGH (ref 0.44–1.00)
GFR calc Af Amer: 59 mL/min — ABNORMAL LOW (ref >60)
GFR, EST NON AFRICAN AMERICAN: 51 mL/min — AB (ref >60)
Glucose, Bld: 116 mg/dL — ABNORMAL HIGH (ref 65–99)
POTASSIUM: 3.6 mmol/L (ref 3.5–5.1)
SODIUM: 142 mmol/L (ref 135–145)

## 2016-12-31 MED ORDER — IOPAMIDOL (ISOVUE-300) INJECTION 61%
INTRAVENOUS | Status: AC
  Administered 2016-12-31: 75 mL
  Filled 2016-12-31: qty 75

## 2016-12-31 MED ORDER — SODIUM CHLORIDE 0.9 % IV BOLUS (SEPSIS)
2000.0000 mL | Freq: Once | INTRAVENOUS | Status: AC
  Administered 2016-12-31: 2000 mL via INTRAVENOUS

## 2016-12-31 NOTE — ED Notes (Signed)
Attempted PIV x2

## 2016-12-31 NOTE — Discharge Instructions (Signed)
Avoid milk or foods containing milk such as cheese or ice cream all having diarrhea. Take Imodium as directed for diarrhea. Make sure that you drink at least six 8 ounce glasses of water or Gatorade each day in order to stay well-hydrated. Call Dr. Allayne Stackrossley's office to schedule appointment. You will need further evaluation of your neck to make sure that you don't have a growth or tumor in your neck which is causing you discomfort with swallowing. Tell Dr. Allayne Stackrossley's office that he had a CT scan of your neck performed here today. Return if your condition worsens for any reason

## 2016-12-31 NOTE — ED Provider Notes (Signed)
[facility]-EMERGENCY DEPT Provider Note   CSN: [id number] Arrival date & time: 12/31/16  1426     History   Chief Complaint Chief Complaint  Patient presents with  . dizziness/ diarrhea/ difficulty swallowing    HPI Sherri [name] is a 51 y.o. female.Complains of diarrhea onset yesterday. She's had 3 episodes of diarrhea. Associated symptoms include lightheadedness worse with standing improve with lying down. Denies blood per rectum. She also reports a full feeling in her throat and feels that she may have food lodged in the right side of her throat since eating chicken nuggets yesterday. She denies difficulty breathing. She does report some discomfort with swallowing however she was able to eat 2 more chicken nuggets earlier today. No hoarseness no fever No vomiting no abdominal pain. Discomfort in throat worse with swallowing. No other associated symptoms. No treatment prior to coming here.  HPI  Past Medical History:  Diagnosis Date  . Angioedema   . Anxiety   . Arthritis   . Asthma   . CAP (community acquired pneumonia) 08/29/2012  . Chronic back pain   . Chronic kidney disease   . Cocaine abuse   . Depression   . Diarrhea   . DVT (deep venous thrombosis) (HCC)   . Environmental allergies   . Fall   . GERD (gastroesophageal reflux disease)   . Heart murmur    has been told once that she has a heart murmur, but has never had any problems  . Hiatal hernia 03/21/2013  . Lumbar herniated disc   . Peripheral vascular disease (HCC)   . Pernicious anemia 10/30/2011  . Postconcussion syndrome 12/30/2014  . Seasonal allergies   . Stiff person syndrome   . Urticaria     Patient Active Problem List   Diagnosis Date Noted  . Distal radius fracture, right 08/16/2016  . Postconcussion syndrome 12/30/2014  . Dizziness 12/18/2014  . Tremor 03/11/2014  . 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 Hiatal hernia 03/21/2013  . Anemia, iron deficiency 01/29/2013  . Spasm of muscle 09/02/2012  .  Benzodiazepine withdrawal (HCC) 08/30/2012  . CAP (community acquired pneumonia) 08/29/2012  . Hypernatremia 08/28/2012  . Metabolic acidosis 08/28/2012  . Leukocytosis 08/28/2012  . UTI (lower urinary tract infection) 08/28/2012  . Anemia 08/28/2012  . Acute back pain 08/27/2012  . Rhabdomyolysis 08/27/2012  . ARF (acute renal failure) (HCC) 08/27/2012  . Hypokalemia 08/27/2012  . Asthma 08/27/2012  . Chronic back pain   . Pernicious anemia 10/30/2011  . DEGENERATIVE DISC DISEASE 08/18/2010  . DEEP VEIN THROMBOSIS/PHLEBITIS 07/28/2010  . Asthma with bronchitis 07/28/2010  . URTICARIA 07/28/2010    Past Surgical History:  Procedure Laterality Date  . BUNIONECTOMY Left   . colonoscopy  07/09/15  . inguinal hernia 1983  1983  . OPEN REDUCTION INTERNAL FIXATION (ORIF) DISTAL RADIAL FRACTURE Right 08/16/2016   Procedure: OPEN REDUCTION INTERNAL FIXATION (ORIF) DISTAL RADIAL FRACTURE;  Surgeon: Eldred Manges, MD;  Location: [facility] OR;  Service: Orthopedics;  Laterality: Right;    OB History    No data available       Home Medications    Prior to Admission medications   Medication Sig Start Date End Date Taking? Authorizing Provider  albuterol (PROVENTIL HFA;VENTOLIN HFA) 108 (90 BASE) MCG/ACT inhaler Inhale 1 puff into the lungs every 6 (six) hours as needed. PRO AIR 06/20/13  Yes Waymon Budge, MD  baclofen (LIORESAL) 20 MG tablet 1.5 tablets in the morning and evening, one tablet at midday  08/22/16  Yes York Spanielharles K Willis, MD  cholecalciferol (VITAMIN D) 1000 UNITS tablet Take 1,000 Units by mouth daily.   Yes Historical Provider, MD  cyanocobalamin (,VITAMIN B-12,) 1000 MCG/ML injection Inject 1,000 mcg into the muscle every 3 (three) months.    Yes Historical Provider, MD  desloratadine (CLARINEX) 5 MG tablet Take 1 tablet (5 mg total) by mouth daily. 11/06/11  Yes Waymon Budgelinton D Young, MD  diclofenac sodium (VOLTAREN) 1 % GEL Apply 2 g topically 4 (four) times daily. 10/02/14  Yes Ozella Rocksavid  J Merrell, MD  FLUoxetine (PROZAC) 10 MG capsule Take 10 mg by mouth daily.   Yes Historical Provider, MD  gabapentin (NEURONTIN) 300 MG capsule 2 capsules in the morning and evening, one capsule at midday 08/22/16  Yes York Spanielharles K Willis, MD  levocetirizine (XYZAL) 5 MG tablet Take 1 tablet (5 mg total) by mouth every evening. 11/06/11  Yes Waymon Budgelinton D Young, MD  montelukast (SINGULAIR) 10 MG tablet Take 10 mg by mouth at bedtime.   Yes Historical Provider, MD  Morphine-Naltrexone (EMBEDA) 20-0.8 MG CPCR Take by mouth.   Yes Historical Provider, MD  Cholecalciferol (VITAMIN D3) 5000 units CAPS Take by mouth.    Historical Provider, MD  diphenhydrAMINE (BENADRYL) 25 MG tablet Take 25 mg by mouth at bedtime as needed for itching or allergies.     Historical Provider, MD  EPIPEN 2-PAK 0.3 MG/0.3ML SOAJ injection USE AS DIRECTED FOR  SEVERE  ALLERGIC  REACTIONS 08/16/15   Waymon Budgelinton D Young, MD  Fluticasone-Salmeterol (ADVAIR) 250-50 MCG/DOSE AEPB Inhale 1 puff into the lungs 2 (two) times daily.  03/14/12   Waymon Budgelinton D Young, MD  loratadine (CLARITIN) 10 MG tablet Take 10 mg by mouth daily.     Historical Provider, MD  meclizine (ANTIVERT) 25 MG tablet  12/20/16   Historical Provider, MD  OVER THE COUNTER MEDICATION I B Guard    Historical Provider, MD  traMADol (ULTRAM) 50 MG tablet Take 50 mg by mouth every 6 (six) hours as needed for moderate pain.    Historical Provider, MD    Family History Family History  Problem Relation Age of Onset  . Cancer Mother   . Other Mother   . COPD Father   . Asthma Brother   . Cancer Brother     colon  . Heart attack Brother   . Seizures Brother     Social History Social History  Substance Use Topics  . Smoking status: Never Smoker  . Smokeless tobacco: Never Used     Comment: Never Used Tobacco  . Alcohol use 0.0 oz/week     Comment: Occasional     Allergies   Ibuprofen; Tylenol [acetaminophen]; Lemon flavor; and Amoxicillin   Review of  Systems Review of Systems  Constitutional: Negative.   HENT: Positive for trouble swallowing.   Respiratory: Negative.   Cardiovascular: Negative.   Gastrointestinal: Positive for diarrhea.  Musculoskeletal: Negative.   Skin: Negative.   Neurological: Positive for light-headedness.  Psychiatric/Behavioral: Negative.   All other systems reviewed and are negative.    Physical Exam Updated Vital Signs BP 131/80   Pulse 71   Temp 99.8 F (37.7 C) (Oral)   Resp 18   LMP 07/29/2012   SpO2 99%   Physical Exam  Constitutional: She appears well-developed and well-nourished.  HENT:  Head: Normocephalic and atraumatic.  Mouth/Throat: Oropharynx is clear and moist.  Eyes: Conjunctivae are normal. Pupils are equal, round, and reactive to light.  Neck: Neck supple.  No tracheal deviation present. No thyromegaly present.  Cardiovascular: Normal rate and regular rhythm.   No murmur heard. Pulmonary/Chest: Effort normal and breath sounds normal.  Abdominal: Soft. Bowel sounds are normal. She exhibits no distension. There is no tenderness.  Musculoskeletal: Normal range of motion. She exhibits no edema or tenderness.  Neurological: She is alert. Coordination normal.  Skin: Skin is warm and dry. No rash noted.  Psychiatric: She has a normal mood and affect.  Nursing note and vitals reviewed.    ED Treatments / Results  Labs (all labs ordered are listed, but only abnormal results are displayed) Labs Reviewed  BASIC METABOLIC PANEL - Abnormal; Notable for the following:       Result Value   Glucose, Bld 116 (*)    Creatinine, Ser 1.20 (*)    GFR calc non Af Amer 51 (*)    GFR calc Af Amer 59 (*)    All other components within normal limits  CBC   Results for orders placed or performed during the hospital encounter of 12/31/16  Basic metabolic panel  Result Value Ref Range   Sodium 142 135 - 145 mmol/L   Potassium 3.6 3.5 - 5.1 mmol/L   Chloride 108 101 - 111 mmol/L   CO2 26  22 - 32 mmol/L   Glucose, Bld 116 (H) 65 - 99 mg/dL   BUN 6 6 - 20 mg/dL   Creatinine, Ser 1.47 (H) 0.44 - 1.00 mg/dL   Calcium 82.9 8.9 - 56.2 mg/dL   GFR calc non Af Amer 51 (L) >60 mL/min   GFR calc Af Amer 59 (L) >60 mL/min   Anion gap 8 5 - 15  CBC  Result Value Ref Range   WBC 6.5 4.0 - 10.5 K/uL   RBC 4.29 3.87 - 5.11 MIL/uL   Hemoglobin 12.4 12.0 - 15.0 g/dL   HCT 13.0 86.5 - 78.4 %   MCV 85.1 78.0 - 100.0 fL   MCH 28.9 26.0 - 34.0 pg   MCHC 34.0 30.0 - 36.0 g/dL   RDW 69.6 29.5 - 28.4 %   Platelets 268 150 - 400 K/uL   Dg Chest 2 View  Result Date: 12/21/2016 CLINICAL DATA:  Shortness of breath, dizziness EXAM: CHEST  2 VIEW COMPARISON:  11/19/2015 FINDINGS: Moderate-sized hiatal hernia. Heart is normal size. No confluent airspace opacities or effusions. No acute bony abnormality. IMPRESSION: Moderate-sized hiatal hernia.  No active disease. Electronically Signed   By: Charlett Nose M.D.   On: 12/21/2016 11:58   Ct Soft Tissue Neck W Contrast  Result Date: 12/31/2016 CLINICAL DATA:  Full feeling right-sided throat. Possible food impaction EXAM: CT NECK WITH CONTRAST TECHNIQUE: Multidetector CT imaging of the neck was performed using the standard protocol following the bolus administration of intravenous contrast. CONTRAST:  75mL ISOVUE-300 IOPAMIDOL (ISOVUE-300) INJECTION 61% COMPARISON:  None. FINDINGS: Pharynx and larynx: Nasopharynx normal. Mild hypertrophy of the tonsils without mass or abscess. Left piriform sinus is not aerated. Right piriform sinuses normally aerated. No definite mass in the left piriform sinus although this could be due to neoplasm or collapse or secretions. Normal vocal cords. Salivary glands: No inflammation, mass, or stone. Thyroid: Negative Lymph nodes: Negative Vascular: Carotid artery and jugular vein patent bilaterally. Limited intracranial: Negative Visualized orbits: Negative Mastoids and visualized paranasal sinuses: Negative Skeleton: Cervical  spine degenerative change. No acute skeletal abnormality. Poor dentition. Upper chest: Lung apices clear Other: None IMPRESSION: Non aerated left piriform sinus which is asymmetric. Direct  visualization recommended to exclude mass or edema in this area. Otherwise no evidence of vocal cord paresis. No retained foreign body. Electronically Signed   By: Marlan Palau M.D.   On: 12/31/2016 17:35   EKG  EKG Interpretation None       Radiology No results found.  Procedures Procedures (including critical care time)  Medications Ordered in ED Medications  sodium chloride 0.9 % bolus 2,000 mL (2,000 mLs Intravenous New Bag/Given 12/31/16 1633)  iopamidol (ISOVUE-300) 61 % injection (75 mLs  Contrast Given 12/31/16 1655)     Initial Impression / Assessment and Plan / ED Course  I have reviewed the triage vital signs and the nursing notes.  Pertinent labs & imaging results that were available during my care of the patient were reviewed by me and considered in my medical decision making (see chart for details).  Clinical Course     Patient resting comfortably and is able to eat graham crackers and drink ice water after treatment with intravenous fluids. Plan Imodium as needed for diarrhea. Avoid dairy. Encourage oral hydration. She is referred back to her primary care physician if diarrhea continues in the next 2 or 3 days. I've also referred her to Dr.Crossley, ear nose throat physician whom she seen in the past for further inspection of her throat  Final Clinical Impressions(s) / ED Diagnoses  Diagnosis #1 diarrhea #2 dysphagia Final diagnoses:  None    New Prescriptions New Prescriptions   No medications on file     Doug Sou, MD 12/31/16 1755

## 2016-12-31 NOTE — ED Notes (Signed)
Patient transported to CT 

## 2016-12-31 NOTE — ED Triage Notes (Signed)
Patient complains of dizziness, diarrhea, difficulty swallowing for the past day. Patient states she feels as if something stuck in throat. States that she feels off. Alert and oriented on arrival. Scheduled for iron infusion due to anemia.

## 2017-01-01 ENCOUNTER — Inpatient Hospital Stay (HOSPITAL_COMMUNITY)
Admission: EM | Admit: 2017-01-01 | Discharge: 2017-01-04 | DRG: 092 | Disposition: A | Discharge status: Home Health | Payer: Medicare Other | Attending: Internal Medicine | Admitting: Internal Medicine

## 2017-01-01 ENCOUNTER — Encounter (HOSPITAL_COMMUNITY): Payer: Self-pay | Admitting: Emergency Medicine

## 2017-01-01 DIAGNOSIS — M62830 Muscle spasm of back: Secondary | ICD-10-CM | POA: Diagnosis not present

## 2017-01-01 DIAGNOSIS — R197 Diarrhea, unspecified: Secondary | ICD-10-CM | POA: Diagnosis not present

## 2017-01-01 DIAGNOSIS — J45909 Unspecified asthma, uncomplicated: Secondary | ICD-10-CM | POA: Diagnosis present

## 2017-01-01 DIAGNOSIS — R0989 Other specified symptoms and signs involving the circulatory and respiratory systems: Secondary | ICD-10-CM | POA: Diagnosis not present

## 2017-01-01 DIAGNOSIS — G2582 Stiff-man syndrome: Secondary | ICD-10-CM | POA: Diagnosis not present

## 2017-01-01 DIAGNOSIS — R1310 Dysphagia, unspecified: Secondary | ICD-10-CM | POA: Diagnosis present

## 2017-01-01 DIAGNOSIS — Z8249 Family history of ischemic heart disease and other diseases of the circulatory system: Secondary | ICD-10-CM

## 2017-01-01 DIAGNOSIS — I739 Peripheral vascular disease, unspecified: Secondary | ICD-10-CM | POA: Diagnosis present

## 2017-01-01 DIAGNOSIS — G894 Chronic pain syndrome: Secondary | ICD-10-CM | POA: Diagnosis present

## 2017-01-01 DIAGNOSIS — K219 Gastro-esophageal reflux disease without esophagitis: Secondary | ICD-10-CM | POA: Diagnosis present

## 2017-01-01 DIAGNOSIS — F329 Major depressive disorder, single episode, unspecified: Secondary | ICD-10-CM | POA: Diagnosis not present

## 2017-01-01 DIAGNOSIS — N182 Chronic kidney disease, stage 2 (mild): Secondary | ICD-10-CM | POA: Diagnosis present

## 2017-01-01 DIAGNOSIS — R42 Dizziness and giddiness: Secondary | ICD-10-CM | POA: Diagnosis not present

## 2017-01-01 DIAGNOSIS — Z86718 Personal history of other venous thrombosis and embolism: Secondary | ICD-10-CM

## 2017-01-01 DIAGNOSIS — G8929 Other chronic pain: Secondary | ICD-10-CM | POA: Diagnosis present

## 2017-01-01 DIAGNOSIS — Z7951 Long term (current) use of inhaled steroids: Secondary | ICD-10-CM

## 2017-01-01 DIAGNOSIS — Z825 Family history of asthma and other chronic lower respiratory diseases: Secondary | ICD-10-CM

## 2017-01-01 DIAGNOSIS — F1410 Cocaine abuse, uncomplicated: Secondary | ICD-10-CM | POA: Diagnosis present

## 2017-01-01 DIAGNOSIS — N179 Acute kidney failure, unspecified: Secondary | ICD-10-CM | POA: Diagnosis not present

## 2017-01-01 DIAGNOSIS — M62838 Other muscle spasm: Secondary | ICD-10-CM | POA: Diagnosis present

## 2017-01-01 DIAGNOSIS — D510 Vitamin B12 deficiency anemia due to intrinsic factor deficiency: Secondary | ICD-10-CM | POA: Diagnosis present

## 2017-01-01 DIAGNOSIS — D509 Iron deficiency anemia, unspecified: Secondary | ICD-10-CM | POA: Diagnosis present

## 2017-01-01 DIAGNOSIS — Z79899 Other long term (current) drug therapy: Secondary | ICD-10-CM

## 2017-01-01 DIAGNOSIS — Z809 Family history of malignant neoplasm, unspecified: Secondary | ICD-10-CM

## 2017-01-01 DIAGNOSIS — F410 Panic disorder [episodic paroxysmal anxiety] without agoraphobia: Secondary | ICD-10-CM | POA: Diagnosis present

## 2017-01-01 DIAGNOSIS — M549 Dorsalgia, unspecified: Secondary | ICD-10-CM

## 2017-01-01 DIAGNOSIS — K529 Noninfective gastroenteritis and colitis, unspecified: Secondary | ICD-10-CM | POA: Diagnosis present

## 2017-01-01 DIAGNOSIS — F32A Depression, unspecified: Secondary | ICD-10-CM | POA: Diagnosis present

## 2017-01-01 NOTE — ED Triage Notes (Signed)
Pt here for dizziness and back spasms; pt sts some sweating; pt seen here yesterday for similar

## 2017-01-02 ENCOUNTER — Telehealth: Payer: Self-pay | Admitting: Neurology

## 2017-01-02 ENCOUNTER — Ambulatory Visit: Payer: Medicare Other | Admitting: Neurology

## 2017-01-02 ENCOUNTER — Observation Stay (HOSPITAL_COMMUNITY): Payer: Medicare Other

## 2017-01-02 DIAGNOSIS — M62838 Other muscle spasm: Secondary | ICD-10-CM | POA: Diagnosis not present

## 2017-01-02 DIAGNOSIS — F1410 Cocaine abuse, uncomplicated: Secondary | ICD-10-CM | POA: Diagnosis not present

## 2017-01-02 DIAGNOSIS — Z7951 Long term (current) use of inhaled steroids: Secondary | ICD-10-CM | POA: Diagnosis not present

## 2017-01-02 DIAGNOSIS — N183 Chronic kidney disease, stage 3 (moderate): Secondary | ICD-10-CM

## 2017-01-02 DIAGNOSIS — F410 Panic disorder [episodic paroxysmal anxiety] without agoraphobia: Secondary | ICD-10-CM | POA: Diagnosis present

## 2017-01-02 DIAGNOSIS — F329 Major depressive disorder, single episode, unspecified: Secondary | ICD-10-CM | POA: Diagnosis present

## 2017-01-02 DIAGNOSIS — Z86718 Personal history of other venous thrombosis and embolism: Secondary | ICD-10-CM | POA: Diagnosis not present

## 2017-01-02 DIAGNOSIS — K529 Noninfective gastroenteritis and colitis, unspecified: Secondary | ICD-10-CM | POA: Diagnosis present

## 2017-01-02 DIAGNOSIS — D509 Iron deficiency anemia, unspecified: Secondary | ICD-10-CM | POA: Diagnosis not present

## 2017-01-02 DIAGNOSIS — Z809 Family history of malignant neoplasm, unspecified: Secondary | ICD-10-CM | POA: Diagnosis not present

## 2017-01-02 DIAGNOSIS — K219 Gastro-esophageal reflux disease without esophagitis: Secondary | ICD-10-CM | POA: Diagnosis present

## 2017-01-02 DIAGNOSIS — Z8249 Family history of ischemic heart disease and other diseases of the circulatory system: Secondary | ICD-10-CM | POA: Diagnosis not present

## 2017-01-02 DIAGNOSIS — G894 Chronic pain syndrome: Secondary | ICD-10-CM | POA: Diagnosis not present

## 2017-01-02 DIAGNOSIS — F32A Depression, unspecified: Secondary | ICD-10-CM | POA: Diagnosis present

## 2017-01-02 DIAGNOSIS — M62830 Muscle spasm of back: Secondary | ICD-10-CM | POA: Diagnosis present

## 2017-01-02 DIAGNOSIS — Z79899 Other long term (current) drug therapy: Secondary | ICD-10-CM | POA: Diagnosis not present

## 2017-01-02 DIAGNOSIS — J45909 Unspecified asthma, uncomplicated: Secondary | ICD-10-CM | POA: Diagnosis present

## 2017-01-02 DIAGNOSIS — F5102 Adjustment insomnia: Secondary | ICD-10-CM | POA: Insufficient documentation

## 2017-01-02 DIAGNOSIS — R1310 Dysphagia, unspecified: Secondary | ICD-10-CM | POA: Diagnosis not present

## 2017-01-02 DIAGNOSIS — N182 Chronic kidney disease, stage 2 (mild): Secondary | ICD-10-CM | POA: Diagnosis present

## 2017-01-02 DIAGNOSIS — R42 Dizziness and giddiness: Secondary | ICD-10-CM | POA: Diagnosis present

## 2017-01-02 DIAGNOSIS — Z825 Family history of asthma and other chronic lower respiratory diseases: Secondary | ICD-10-CM | POA: Diagnosis not present

## 2017-01-02 DIAGNOSIS — N179 Acute kidney failure, unspecified: Secondary | ICD-10-CM | POA: Diagnosis present

## 2017-01-02 DIAGNOSIS — I739 Peripheral vascular disease, unspecified: Secondary | ICD-10-CM | POA: Diagnosis present

## 2017-01-02 DIAGNOSIS — G2582 Stiff-man syndrome: Principal | ICD-10-CM

## 2017-01-02 DIAGNOSIS — D510 Vitamin B12 deficiency anemia due to intrinsic factor deficiency: Secondary | ICD-10-CM | POA: Diagnosis not present

## 2017-01-02 LAB — URINALYSIS, ROUTINE W REFLEX MICROSCOPIC
Bilirubin Urine: NEGATIVE
Glucose, UA: NEGATIVE mg/dL
Ketones, ur: 20 mg/dL — AB
Nitrite: NEGATIVE
PH: 5 (ref 5.0–8.0)
Protein, ur: NEGATIVE mg/dL
SPECIFIC GRAVITY, URINE: 1.015 (ref 1.005–1.030)

## 2017-01-02 LAB — RAPID URINE DRUG SCREEN, HOSP PERFORMED
AMPHETAMINES: NOT DETECTED
BARBITURATES: NOT DETECTED
BENZODIAZEPINES: POSITIVE — AB
Cocaine: NOT DETECTED
Opiates: POSITIVE — AB
Tetrahydrocannabinol: NOT DETECTED

## 2017-01-02 LAB — CK: Total CK: 137 U/L (ref 38–234)

## 2017-01-02 MED ORDER — METHOCARBAMOL 1000 MG/10ML IJ SOLN
1000.0000 mg | Freq: Once | INTRAMUSCULAR | Status: AC
  Administered 2017-01-02: 1000 mg via INTRAVENOUS
  Filled 2017-01-02: qty 10

## 2017-01-02 MED ORDER — SODIUM CHLORIDE 0.9 % IV SOLN
INTRAVENOUS | Status: DC
  Administered 2017-01-02 – 2017-01-04 (×5): via INTRAVENOUS

## 2017-01-02 MED ORDER — MOMETASONE FURO-FORMOTEROL FUM 200-5 MCG/ACT IN AERO: 2.0000 | INHALATION_SPRAY | Freq: Two times a day (BID) | RESPIRATORY_TRACT | Status: DC

## 2017-01-02 MED ORDER — BISACODYL 10 MG RE SUPP: 10.0000 mg | Freq: Every day | RECTAL | Status: DC | PRN

## 2017-01-02 MED ORDER — LORAZEPAM 2 MG/ML IJ SOLN
INTRAMUSCULAR | Status: AC
  Filled 2017-01-02: qty 1

## 2017-01-02 MED ORDER — LORAZEPAM 2 MG/ML IJ SOLN
2.0000 mg | INTRAMUSCULAR | Status: AC
  Administered 2017-01-02: 2 mg via INTRAVENOUS

## 2017-01-02 MED ORDER — MECLIZINE HCL 25 MG PO TABS: 25.0000 mg | ORAL_TABLET | Freq: Two times a day (BID) | ORAL | Status: DC | PRN

## 2017-01-02 MED ORDER — GADOBENATE DIMEGLUMINE 529 MG/ML IV SOLN
15.0000 mL | Freq: Once | INTRAVENOUS | Status: AC
  Administered 2017-01-02: 15 mL via INTRAVENOUS

## 2017-01-02 MED ORDER — ONDANSETRON HCL 4 MG/2ML IJ SOLN: 4.0000 mg | Freq: Four times a day (QID) | INTRAMUSCULAR | Status: DC | PRN

## 2017-01-02 MED ORDER — DIAZEPAM 5 MG PO TABS
10.0000 mg | ORAL_TABLET | Freq: Three times a day (TID) | ORAL | Status: DC
  Administered 2017-01-02 – 2017-01-03 (×4): 10 mg via ORAL
  Filled 2017-01-02 (×4): qty 2

## 2017-01-02 MED ORDER — TRAMADOL HCL 50 MG PO TABS: 50.0000 mg | ORAL_TABLET | Freq: Four times a day (QID) | ORAL | Status: DC | PRN

## 2017-01-02 MED ORDER — SODIUM CHLORIDE 0.9 % IV BOLUS (SEPSIS)
1000.0000 mL | Freq: Once | INTRAVENOUS | Status: AC
  Administered 2017-01-02: 1000 mL via INTRAVENOUS

## 2017-01-02 MED ORDER — GABAPENTIN 300 MG PO CAPS
600.0000 mg | ORAL_CAPSULE | Freq: Two times a day (BID) | ORAL | Status: DC
  Administered 2017-01-02 – 2017-01-04 (×5): 600 mg via ORAL
  Filled 2017-01-02 (×5): qty 2

## 2017-01-02 MED ORDER — METHOCARBAMOL 1000 MG/10ML IJ SOLN: 1000.0000 mg | Freq: Once | INTRAMUSCULAR | Status: DC

## 2017-01-02 MED ORDER — ENOXAPARIN SODIUM 40 MG/0.4ML ~~LOC~~ SOLN: 40.0000 mg | SUBCUTANEOUS | Status: DC

## 2017-01-02 MED ORDER — LORATADINE 10 MG PO TABS
10.0000 mg | ORAL_TABLET | Freq: Every day | ORAL | Status: DC
  Administered 2017-01-02 – 2017-01-04 (×3): 10 mg via ORAL
  Filled 2017-01-02 (×3): qty 1

## 2017-01-02 MED ORDER — DIPHENHYDRAMINE HCL 25 MG PO CAPS: 25.0000 mg | ORAL_CAPSULE | Freq: Every evening | ORAL | Status: DC | PRN

## 2017-01-02 MED ORDER — METHOCARBAMOL 1000 MG/10ML IJ SOLN
1000.0000 mg | Freq: Three times a day (TID) | INTRAVENOUS | Status: DC | PRN
  Administered 2017-01-03 – 2017-01-04 (×5): 1000 mg via INTRAVENOUS
  Filled 2017-01-02 (×5): qty 10

## 2017-01-02 MED ORDER — PANTOPRAZOLE SODIUM 40 MG IV SOLR
40.0000 mg | INTRAVENOUS | Status: DC
  Administered 2017-01-02 – 2017-01-03 (×2): 40 mg via INTRAVENOUS
  Filled 2017-01-02 (×2): qty 40

## 2017-01-02 MED ORDER — BACID PO TABS
2.0000 | ORAL_TABLET | Freq: Two times a day (BID) | ORAL | Status: DC
  Administered 2017-01-02 – 2017-01-04 (×5): 2 via ORAL
  Filled 2017-01-02 (×6): qty 2

## 2017-01-02 MED ORDER — SODIUM CHLORIDE 0.9 % IV SOLN
INTRAVENOUS | Status: AC
  Administered 2017-01-02: 09:00:00 via INTRAVENOUS

## 2017-01-02 MED ORDER — MAGNESIUM CITRATE PO SOLN: 1.0000 | Freq: Once | ORAL | Status: DC | PRN

## 2017-01-02 MED ORDER — ALBUTEROL SULFATE HFA 108 (90 BASE) MCG/ACT IN AERS: 1.0000 | INHALATION_SPRAY | Freq: Four times a day (QID) | RESPIRATORY_TRACT | Status: DC | PRN

## 2017-01-02 MED ORDER — SENNOSIDES-DOCUSATE SODIUM 8.6-50 MG PO TABS: 1.0000 | ORAL_TABLET | Freq: Every evening | ORAL | Status: DC | PRN

## 2017-01-02 MED ORDER — ONDANSETRON HCL 4 MG PO TABS: 4.0000 mg | ORAL_TABLET | Freq: Four times a day (QID) | ORAL | Status: DC | PRN

## 2017-01-02 MED ORDER — TRAZODONE HCL 50 MG PO TABS
25.0000 mg | ORAL_TABLET | Freq: Every evening | ORAL | Status: DC | PRN
  Administered 2017-01-02 – 2017-01-03 (×2): 25 mg via ORAL
  Filled 2017-01-02 (×2): qty 1

## 2017-01-02 MED ORDER — FLUOXETINE HCL 10 MG PO CAPS
10.0000 mg | ORAL_CAPSULE | Freq: Every day | ORAL | Status: DC
  Administered 2017-01-02 – 2017-01-04 (×3): 10 mg via ORAL
  Filled 2017-01-02 (×4): qty 1

## 2017-01-02 NOTE — ED Notes (Signed)
Admitting MD bedside

## 2017-01-02 NOTE — ED Notes (Signed)
Bed linen changed, pt cleaned. Unmeasured urine (incontinence)

## 2017-01-02 NOTE — ED Notes (Signed)
Attempted to call report x2

## 2017-01-02 NOTE — ED Provider Notes (Signed)
[facility]-EMERGENCY DEPT Provider Note   CSN: [id number] Arrival date & time: 01/01/17  1729     History   Chief Complaint Chief Complaint  Patient presents with  . Dizziness  . Back Pain    HPI Sherri [name] is a 51 y.o. female.  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 She is followed by Dr. Anne Hahn, neurology. She reports her symptoms have been progressively worsening over the last 3-4 days causing increasing difficulty ambulating. She states she has gotten to where she cannot get up to the kitchen for nutrition and becomes dehydrated, and she cannot get up to the bathroom and reports she is urinating on a pad and cleaning herself from where she is. She was seen last night in the ED and provided IV fluids and discharged home. She has a scheduled neurology appointment scheduled for 01/03/17 but could not wait until that appointment. She was brought to the ED by her pastor who states she has been calling him multiple times daily and through the night with these complaints. He reports she cannot get up to answer the door and leaves a house in her car for him to access house.    The history is provided by the patient and a friend. No language interpreter was used.    Past Medical History:  Diagnosis Date  . Angioedema   . Anxiety   . Arthritis   . Asthma   . CAP (community acquired pneumonia) 08/29/2012  . Chronic back pain   . Chronic kidney disease   . Cocaine abuse   . Depression   . Diarrhea   . DVT (deep venous thrombosis) (HCC)   . Environmental allergies   . Fall   . GERD (gastroesophageal reflux disease)   . Heart murmur    has been told once that she has a heart murmur, but has never had any problems  . Hiatal hernia 03/21/2013  . Lumbar herniated disc   . Peripheral vascular disease (HCC)   . Pernicious anemia 10/30/2011    . Postconcussion syndrome 12/30/2014  . Seasonal allergies   . Stiff person syndrome   . Urticaria     Patient Active Problem List   Diagnosis Date Noted  . Distal radius fracture, right 08/16/2016  . Postconcussion syndrome 12/30/2014  . Dizziness 12/18/2014  . Tremor 03/11/2014  . 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 Hiatal hernia 03/21/2013  . Anemia, iron deficiency 01/29/2013  . Spasm of muscle 09/02/2012  . Benzodiazepine withdrawal (HCC) 08/30/2012  . CAP (community acquired pneumonia) 08/29/2012  . Hypernatremia 08/28/2012  . Metabolic acidosis 08/28/2012  . Leukocytosis 08/28/2012  . UTI (lower urinary tract infection) 08/28/2012  . Anemia 08/28/2012  . Acute back pain 08/27/2012  . Rhabdomyolysis 08/27/2012  . ARF (acute renal failure) (HCC) 08/27/2012  . Hypokalemia 08/27/2012  . Asthma 08/27/2012  . Chronic back pain   . Pernicious anemia 10/30/2011  . DEGENERATIVE DISC DISEASE 08/18/2010  . DEEP VEIN THROMBOSIS/PHLEBITIS 07/28/2010  . Asthma with bronchitis 07/28/2010  . URTICARIA 07/28/2010    Past Surgical History:  Procedure Laterality Date  . BUNIONECTOMY Left   . colonoscopy  07/09/15  . inguinal hernia 1983  1983  . OPEN REDUCTION INTERNAL FIXATION (ORIF) DISTAL RADIAL FRACTURE Right 08/16/2016   Procedure: OPEN REDUCTION INTERNAL FIXATION (ORIF) DISTAL RADIAL FRACTURE;  Surgeon: Eldred Manges, MD;  Location:  [facility] OR;  Service: Orthopedics;  Laterality: Right;    OB History    No data available       Home Medications    Prior to Admission medications   Medication Sig Start Date End Date Taking? Authorizing Provider  albuterol (PROVENTIL HFA;VENTOLIN HFA) 108 (90 BASE) MCG/ACT inhaler Inhale 1 puff into the lungs every 6 (six) hours as needed. PRO AIR 06/20/13   Waymon Budge, MD  baclofen (LIORESAL) 20 MG tablet 1.5 tablets in the morning and evening, one tablet at midday 08/22/16   York Spaniel, MD  cholecalciferol (VITAMIN D) 1000 UNITS  tablet Take 1,000 Units by mouth daily.    Historical Provider, MD  Cholecalciferol (VITAMIN D3) 5000 units CAPS Take by mouth.    Historical Provider, MD  cyanocobalamin (,VITAMIN B-12,) 1000 MCG/ML injection Inject 1,000 mcg into the muscle every 3 (three) months.     Historical Provider, MD  desloratadine (CLARINEX) 5 MG tablet Take 1 tablet (5 mg total) by mouth daily. 11/06/11   Waymon Budge, MD  diclofenac sodium (VOLTAREN) 1 % GEL Apply 2 g topically 4 (four) times daily. 10/02/14   Ozella Rocks, MD  diphenhydrAMINE (BENADRYL) 25 MG tablet Take 25 mg by mouth at bedtime as needed for itching or allergies.     Historical Provider, MD  EPIPEN 2-PAK 0.3 MG/0.3ML SOAJ injection USE AS DIRECTED FOR  SEVERE  ALLERGIC  REACTIONS 08/16/15   Waymon Budge, MD  FLUoxetine (PROZAC) 10 MG capsule Take 10 mg by mouth daily.    Historical Provider, MD  Fluticasone-Salmeterol (ADVAIR) 250-50 MCG/DOSE AEPB Inhale 1 puff into the lungs 2 (two) times daily.  03/14/12   Waymon Budge, MD  gabapentin (NEURONTIN) 300 MG capsule 2 capsules in the morning and evening, one capsule at midday 08/22/16   York Spaniel, MD  levocetirizine (XYZAL) 5 MG tablet Take 1 tablet (5 mg total) by mouth every evening. 11/06/11   Waymon Budge, MD  loratadine (CLARITIN) 10 MG tablet Take 10 mg by mouth daily.     Historical Provider, MD  meclizine (ANTIVERT) 25 MG tablet  12/20/16   Historical Provider, MD  montelukast (SINGULAIR) 10 MG tablet Take 10 mg by mouth at bedtime.    Historical Provider, MD  Morphine-Naltrexone (EMBEDA) 20-0.8 MG CPCR Take by mouth.    Historical Provider, MD  OVER THE COUNTER MEDICATION I B Guard    Historical Provider, MD  traMADol (ULTRAM) 50 MG tablet Take 50 mg by mouth every 6 (six) hours as needed for moderate pain.    Historical Provider, MD    Family History Family History  Problem Relation Age of Onset  . Cancer Mother   . Other Mother   . COPD Father   . Asthma Brother   .  Cancer Brother     colon  . Heart attack Brother   . Seizures Brother     Social History Social History  Substance Use Topics  . Smoking status: Never Smoker  . Smokeless tobacco: Never Used     Comment: Never Used Tobacco  . Alcohol use 0.0 oz/week     Comment: Occasional     Allergies   Ibuprofen; Tylenol [acetaminophen]; Lemon flavor; and Amoxicillin   Review of Systems Review of Systems  Constitutional: Negative for chills and fever.  HENT: Negative.   Respiratory: Negative.   Cardiovascular: Negative.   Gastrointestinal: Negative.   Musculoskeletal: Positive for back pain and myalgias.  Neurological: Positive for dizziness.     Physical Exam Updated Vital Signs BP 129/80   Pulse 83   Temp 98.1 F (36.7 C) (Oral)   Resp 19   LMP 07/29/2012   SpO2 99%   Physical Exam  Constitutional: She is oriented to person, place, and time. She appears well-developed and well-nourished.  HENT:  Head: Normocephalic.  Neck: Normal range of motion. Neck supple.  Cardiovascular: Normal rate and regular rhythm.   No murmur heard. Pulmonary/Chest: Effort normal and breath sounds normal. She has no wheezes. She has no rales.  Abdominal: Soft. Bowel sounds are normal. There is no tenderness. There is no rebound and no guarding.  Musculoskeletal: Normal range of motion.  Patient has generalized tenderness to entire back and lower extremities. She appears stiff/rigid affecting back and legs and there is resistance to active or passive ROM.   Neurological: She is alert and oriented to person, place, and time.  Skin: Skin is warm and dry. No rash noted.  Psychiatric:  Patient does not open eyes during history or exam.      ED Treatments / Results  Labs (all labs ordered are listed, but only abnormal results are displayed) Labs Reviewed - No data to display  EKG  EKG Interpretation None       Radiology Ct Soft Tissue Neck W Contrast  Result Date: 12/31/2016 CLINICAL  DATA:  Full feeling right-sided throat. Possible food impaction EXAM: CT NECK WITH CONTRAST TECHNIQUE: Multidetector CT imaging of the neck was performed using the standard protocol following the bolus administration of intravenous contrast. CONTRAST:  75mL ISOVUE-300 IOPAMIDOL (ISOVUE-300) INJECTION 61% COMPARISON:  None. FINDINGS: Pharynx and larynx: Nasopharynx normal. Mild hypertrophy of the tonsils without mass or abscess. Left piriform sinus is not aerated. Right piriform sinuses normally aerated. No definite mass in the left piriform sinus although this could be due to neoplasm or collapse or secretions. Normal vocal cords. Salivary glands: No inflammation, mass, or stone. Thyroid: Negative Lymph nodes: Negative Vascular: Carotid artery and jugular vein patent bilaterally. Limited intracranial: Negative Visualized orbits: Negative Mastoids and visualized paranasal sinuses: Negative Skeleton: Cervical spine degenerative change. No acute skeletal abnormality. Poor dentition. Upper chest: Lung apices clear Other: None IMPRESSION: Non aerated left piriform sinus which is asymmetric. Direct visualization recommended to exclude mass or edema in this area. Otherwise no evidence of vocal cord paresis. No retained foreign body. Electronically Signed   By: Marlan Palauharles  Clark M.D.   On: 12/31/2016 17:35    Procedures Procedures (including critical care time)  Medications Ordered in ED Medications - No data to display   Initial Impression / Assessment and Plan / ED Course  I have reviewed the triage vital signs and the nursing notes.  Pertinent labs & imaging results that were available during my care of the patient were reviewed by me and considered in my medical decision making (see chart for details).  Clinical Course    Patient in the ED with complaint of severe pain in back and legs with spasms. She takes Baclofen at home but this is not helping. She is given Baclofen by her neurologist.  During ED  visit, attempt at ambulating the patient are unsuccessful. Orthostatic vital signs were ordered, however, patient cannot go to sitting position or bend her back or legs to accommodate movement. She will periodically cry out with severe pain due to spasm.  Total CK negative. Labs from previous visit reviewed and are essentially normal. Chart reviewed. 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 The patient does not appear to be able to walk or care for ADL's. Of note, also in Dr. Anne Hahn' note is consideration of a psychogenic cause to the severity of patient's condition.   Discussed with Dr. Clyde Lundborg of [facility] who accepts for admission.   Final Clinical Impressions(s) / ED Diagnoses   Final diagnoses:  None  1. Stiff Person Syndrome  New Prescriptions New Prescriptions   No medications on file     Elpidio Anis, PA-C 01/03/17 1610    Gilda Crease, MD 01/09/17 [phone]

## 2017-01-02 NOTE — ED Notes (Signed)
Transported to MRI

## 2017-01-02 NOTE — ED Notes (Signed)
Pt is unable to stand for orthostatic vitals 

## 2017-01-02 NOTE — ED Notes (Signed)
Sent message to pharmacy requesting medication. 

## 2017-01-02 NOTE — ED Notes (Signed)
Pharmacy called to verify medications.

## 2017-01-02 NOTE — Progress Notes (Signed)
This is a no charge note  Pending admission per PA Melvenia BeamShari  51 year old lady with past medical history of stiff person syndrome, cocaine abuse, DVT, PVD, GERD, depression, who presents with dizziness, back spasm, difficulty swallowing, difficulty in movement. CT of neck soft tissue did not show any mass. CK was 137. Patient was in ED yesterday for the same. Pt is accepted to med-surg bed for obs.  Lorretta HarpXilin Dequita Schleicher, MD  [facility] Pager [phone]  If 7PM-7AM, please contact night-coverage [email or web address] Password [email or web address] 01/02/2017, 6:12 AM

## 2017-01-02 NOTE — ED Notes (Signed)
Transport is here to take patient to MRI but pt states he needs medication to help her be still for the MRI. Neuro paged.

## 2017-01-02 NOTE — Consult Note (Signed)
NEURO HOSPITALIST CONSULT NOTE   Requestig physician: Dr. Evelena Peat   Reason for Consult: Vertigo and spasms   History obtained from:  Patient   Chart    HPI:                                                                                                                                          Sherri [name] is an 51 y.o. female with multiple medical issues listed below including stiff person syndrome,herniated L4-S1 discs,pernicious anemia on B12 injections, polysubstance abuse, depression, chronic diarrhea, history of DVT not on anticoagulation, GERD, recent UTI 12/28, presenting to the ED with dizziness, back spasm. Pain was described as severe, lasting less than a minute and then returning. Episode frequency is about 2-3 x a monthShe also reports difficulty swallowing without vomiting. She reports as if food is stuck but is still able to tolerate solids, although favors liquids. She was scheduled to see ENT and her primary neurologist but has yet to do so and came to hospital due to worsening symptoms. Patient also admits to be having panic attacks are regular basis in the last 2 weeks.   A phone call was made to Dr. Anne Hahn, who stated he did not cancel her diazepam as she was having good benefit from this medication and he is unclear of why this was stopped. Currently patient is actively having intermittent muscle spasms of back and legs which is increasing patient's heart rate is significantly along with her blood pressure.    Past Medical History:  Diagnosis Date  . Angioedema   . Anxiety   . Arthritis   . Asthma   . CAP (community acquired pneumonia) 08/29/2012  . Chronic back pain   . Chronic kidney disease   . Cocaine abuse   . Depression   . Diarrhea   . DVT (deep venous thrombosis) (HCC)   . Environmental allergies   . Fall   . GERD (gastroesophageal reflux disease)   . Heart murmur    has been told once that she has a heart murmur, but has never had  any problems  . Hiatal hernia 03/21/2013  . Lumbar herniated disc   . Peripheral vascular disease (HCC)   . Pernicious anemia 10/30/2011  . Postconcussion syndrome 12/30/2014  . Seasonal allergies   . Stiff person syndrome   . Urticaria     Past Surgical History:  Procedure Laterality Date  . BUNIONECTOMY Left   . colonoscopy  07/09/15  . inguinal hernia 1983  1983  . OPEN REDUCTION INTERNAL FIXATION (ORIF) DISTAL RADIAL FRACTURE Right 08/16/2016   Procedure: OPEN REDUCTION INTERNAL FIXATION (ORIF) DISTAL RADIAL FRACTURE;  Surgeon: Eldred Manges, MD;  Location: [facility] OR;  Service: Orthopedics;  Laterality: Right;    Family History  Problem Relation Age of Onset  . Cancer Mother   . Other Mother   . COPD Father   . Asthma Brother   . Cancer Brother     colon  . Heart attack Brother   . Seizures Brother       Social History:  reports that she has never smoked. She has never used smokeless tobacco. She reports that she drinks alcohol. She reports that she uses drugs, including Cocaine.  Allergies  Allergen Reactions  . Ibuprofen Other (See Comments)    Does not take due to hx of renal insufficiency  . Tylenol [Acetaminophen] Hives    Cannot take large quantities  . Lemon Flavor Swelling    Severe Lip Swelling   . Amoxicillin Diarrhea    MEDICATIONS:                                                                                                                     Current Facility-Administered Medications  Medication Dose Route Frequency Provider Last Rate Last Dose  . 0.9 %  sodium chloride infusion   Intravenous STAT Elpidio Anis, PA-C 125 mL/hr at 01/02/17 1610    . 0.9 %  sodium chloride infusion   Intravenous Continuous Marcos Eke, PA-C 100 mL/hr at 01/02/17 1027    . albuterol (PROVENTIL HFA;VENTOLIN HFA) 108 (90 Base) MCG/ACT inhaler 1 puff  1 puff Inhalation Q6H PRN Marcos Eke, PA-C      . bisacodyl (DULCOLAX) suppository 10 mg  10 mg Rectal Daily PRN Marcos Eke, PA-C      . diphenhydrAMINE (BENADRYL) capsule 25 mg  25 mg Oral QHS PRN Marcos Eke, PA-C      . FLUoxetine (PROZAC) capsule 10 mg  10 mg Oral Daily Marcos Eke, PA-C      . gabapentin (NEURONTIN) capsule 600 mg  600 mg Oral BID Marcos Eke, PA-C      . lactobacillus acidophilus (BACID) tablet 2 tablet  2 tablet Oral BID Marcos Eke, PA-C      . loratadine (CLARITIN) tablet 10 mg  10 mg Oral Daily Marcos Eke, PA-C      . LORazepam (ATIVAN) 2 MG/ML injection           . magnesium citrate solution 1 Bottle  1 Bottle Oral Once PRN Marcos Eke, PA-C      . meclizine (ANTIVERT) tablet 25 mg  25 mg Oral BID PRN Marcos Eke, PA-C      . methocarbamol (ROBAXIN) 1,000 mg in dextrose 5 % 50 mL IVPB  1,000 mg Intravenous Q8H PRN Ozella Rocks, MD      . ondansetron [facility]) tablet 4 mg  4 mg Oral Q6H PRN Marcos Eke, PA-C       Or  . ondansetron [facility]) injection 4 mg  4 mg Intravenous Q6H PRN Marcos Eke, PA-C      . pantoprazole (PROTONIX) injection 40 mg  40 mg Intravenous  Q24H Marcos Eke, PA-C   40 mg at 01/02/17 4098  . senna-docusate (Senokot-S) tablet 1 tablet  1 tablet Oral QHS PRN Marcos Eke, PA-C      . traMADol Janean Sark) tablet 50 mg  50 mg Oral Q6H PRN Marcos Eke, PA-C      . traZODone (DESYREL) tablet 25 mg  25 mg Oral QHS PRN Marcos Eke, PA-C       Current Outpatient Prescriptions  Medication Sig Dispense Refill  . albuterol (PROVENTIL HFA;VENTOLIN HFA) 108 (90 BASE) MCG/ACT inhaler Inhale 1 puff into the lungs every 6 (six) hours as needed. PRO AIR    . baclofen (LIORESAL) 20 MG tablet 1.5 tablets in the morning and evening, one tablet at midday 360 each 1  . cholecalciferol (VITAMIN D) 1000 UNITS tablet Take 1,000 Units by mouth daily.    . Cholecalciferol (VITAMIN D3) 5000 units CAPS Take by mouth.    . cyanocobalamin (,VITAMIN B-12,) 1000 MCG/ML injection Inject 1,000 mcg into the muscle every 3 (three) months.     .  desloratadine (CLARINEX) 5 MG tablet Take 1 tablet (5 mg total) by mouth daily. 30 tablet 0  . diclofenac sodium (VOLTAREN) 1 % GEL Apply 2 g topically 4 (four) times daily. 1 Tube 0  . diphenhydrAMINE (BENADRYL) 25 MG tablet Take 25 mg by mouth at bedtime as needed for itching or allergies.     [name] EPIPEN 2-PAK 0.3 MG/0.3ML SOAJ injection USE AS DIRECTED FOR  SEVERE  ALLERGIC  REACTIONS 1 Device 0  . FLUoxetine (PROZAC) 10 MG capsule Take 10 mg by mouth daily.    . Fluticasone-Salmeterol (ADVAIR) 250-50 MCG/DOSE AEPB Inhale 1 puff into the lungs 2 (two) times daily.     [name] gabapentin (NEURONTIN) 300 MG capsule 2 capsules in the morning and evening, one capsule at midday 450 capsule 1  . levocetirizine (XYZAL) 5 MG tablet Take 1 tablet (5 mg total) by mouth every evening. 30 tablet 0  . loratadine (CLARITIN) 10 MG tablet Take 10 mg by mouth daily.     . meclizine (ANTIVERT) 25 MG tablet     . montelukast (SINGULAIR) 10 MG tablet Take 10 mg by mouth at bedtime.    . Morphine-Naltrexone (EMBEDA) 20-0.8 MG CPCR Take by mouth.    [name] OVER THE COUNTER MEDICATION I B Guard    . traMADol (ULTRAM) 50 MG tablet Take 50 mg by mouth every 6 (six) hours as needed for moderate pain.        ROS:                                                                                                                                       History obtained from the patient  General ROS: negative for - chills, fatigue, fever, night sweats, weight gain or weight loss Psychological ROS: negative for - behavioral disorder, hallucinations, memory  difficulties, mood swings or suicidal ideation Ophthalmic ROS: negative for - blurry vision, double vision, eye pain or loss of vision ENT ROS: negative for - epistaxis, nasal discharge, oral lesions, sore throat, tinnitus or vertigo Allergy and Immunology ROS: negative for - hives or itchy/watery eyes Hematological and Lymphatic ROS: negative for - bleeding problems, bruising or  swollen lymph nodes Endocrine ROS: negative for - galactorrhea, hair pattern changes, polydipsia/polyuria or temperature intolerance Respiratory ROS: negative for - cough, hemoptysis, shortness of breath or wheezing Cardiovascular ROS: negative for - chest pain, dyspnea on exertion, edema or irregular heartbeat Gastrointestinal ROS: negative for - abdominal pain, diarrhea, hematemesis, nausea/vomiting or stool incontinence Genito-Urinary ROS: negative for - dysuria, hematuria, incontinence or urinary frequency/urgency Musculoskeletal ROS: negative for - joint swelling or muscular weakness Neurological ROS: as noted in HPI Dermatological ROS: negative for rash and skin lesion changes   Blood pressure 138/91, pulse 118, temperature 98.1 F (36.7 C), temperature source Oral, resp. rate 14, last menstrual period 07/29/2012, SpO2 99 %.   Neurologic Examination:                                                                                                      HEENT-  Normocephalic, no lesions, without obvious abnormality.  Normal external eye and conjunctiva.  Normal TM's bilaterally.  Normal auditory canals and external ears. Normal external nose, mucus membranes and septum.  Normal pharynx. Cardiovascular- S1, S2 normal, pulses palpable throughout   Lungs- chest clear, no wheezing, rales, normal symmetric air entry Abdomen- normal findings: bowel sounds normal Extremities- less then 2 second capillary refill Lymph-no adenopathy palpable Musculoskeletal-no joint tenderness, deformity or swelling Skin-warm and dry, no hyperpigmentation, vitiligo, or suspicious lesions  Neurological Examination Mental Status: Alert, oriented, thought content appropriate.  Speech fluent without evidence of aphasia.  Able to follow 3 step commands without difficulty. Cranial Nerves: II:  Visual fields grossly normal, pupils equal, round, reactive to light and accommodation III,IV, VI: ptosis not present,  extra-ocular motions intact bilaterally V,VII: smile symmetric, facial light touch sensation normal bilaterally VIII: hearing normal bilaterally IX,X: uvula rises symmetrically XI: bilateral shoulder shrug XII: midline tongue extension Motor: Right : Upper extremity   5/5    Left:     Upper extremity   5/5  Lower extremity   5/5     Lower extremity   5/5 Tone and bulk:normal tone throughout; no atrophy noted Sensory: Pinprick and light touch intact throughout, bilaterally Deep Tendon Reflexes: 2+ and symmetric throughout Plantars: Right: downgoing   Left: downgoing Cerebellar: normal finger-to-nose, normal rapid alternating movements and normal heel-to-shin test Gait: Not tested  --- During exam patient is actively having muscle spasms in her back and legs which are lasting from 3-5 minutes.   Lab Results: Basic Metabolic Panel:  Recent Labs Lab 12/26/16 1002 12/31/16 1458  NA 143 142  K 4.0 3.6  CL 106 108  CO2 26 26  GLUCOSE 157* 116*  BUN 16 6  CREATININE 1.1 1.20*  CALCIUM 10.2 10.3    Liver Function Tests:  Recent Labs Lab  12/26/16 1002  AST 28  ALT 21  ALKPHOS 91*  BILITOT 0.60  PROT 8.2*  ALBUMIN 4.3   No results for input(s): LIPASE, AMYLASE in the last 168 hours. No results for input(s): AMMONIA in the last 168 hours.  CBC:  Recent Labs Lab 12/26/16 1002 12/31/16 1458  WBC 7.4 6.5  NEUTROABS 5.7  --   HGB 12.5 12.4  HCT 37.5 36.5  MCV 86 85.1  PLT 265 268    Cardiac Enzymes:  Recent Labs Lab 01/02/17 0343  CKTOTAL 137    Lipid Panel: No results for input(s): CHOL, TRIG, HDL, CHOLHDL, VLDL, LDLCALC in the last 168 hours.  CBG: No results for input(s): GLUCAP in the last 168 hours.  Microbiology: Results for orders placed or performed during the hospital encounter of 08/27/12  Urine culture     Status: None   Collection Time: 08/27/12  2:33 PM  Result Value Ref Range Status   Specimen Description URINE, CATHETERIZED  Final    Special Requests ADDED 08/28/12 2013  Final   Culture  Setup Time 08/28/2012 21:20  Final   Colony Count NO GROWTH  Final   Culture NO GROWTH  Final   Report Status 08/29/2012 FINAL  Final  MRSA PCR Screening     Status: None   Collection Time: 08/30/12  5:22 PM  Result Value Ref Range Status   MRSA by PCR NEGATIVE NEGATIVE Final    Comment:        The GeneXpert MRSA Assay (FDA approved for NASAL specimens only), is one component of a comprehensive MRSA colonization surveillance program. It is not intended to diagnose MRSA infection nor to guide or monitor treatment for MRSA infections.    Coagulation Studies: No results for input(s): LABPROT, INR in the last 72 hours.  Imaging: Ct Soft Tissue Neck W Contrast  Result Date: 12/31/2016 CLINICAL DATA:  Full feeling right-sided throat. Possible food impaction EXAM: CT NECK WITH CONTRAST TECHNIQUE: Multidetector CT imaging of the neck was performed using the standard protocol following the bolus administration of intravenous contrast. CONTRAST:  75mL ISOVUE-300 IOPAMIDOL (ISOVUE-300) INJECTION 61% COMPARISON:  None. FINDINGS: Pharynx and larynx: Nasopharynx normal. Mild hypertrophy of the tonsils without mass or abscess. Left piriform sinus is not aerated. Right piriform sinuses normally aerated. No definite mass in the left piriform sinus although this could be due to neoplasm or collapse or secretions. Normal vocal cords. Salivary glands: No inflammation, mass, or stone. Thyroid: Negative Lymph nodes: Negative Vascular: Carotid artery and jugular vein patent bilaterally. Limited intracranial: Negative Visualized orbits: Negative Mastoids and visualized paranasal sinuses: Negative Skeleton: Cervical spine degenerative change. No acute skeletal abnormality. Poor dentition. Upper chest: Lung apices clear Other: None IMPRESSION: Non aerated left piriform sinus which is asymmetric. Direct visualization recommended to exclude mass or edema in this  area. Otherwise no evidence of vocal cord paresis. No retained foreign body. Electronically Signed   By: Marlan Palau M.D.   On: 12/31/2016 17:35       Assessment and plan per attending neurologist  Felicie Morn PA-C [facility] [phone]  01/02/2017, 9:33 AM   Assessment/Plan: This is a 51 year old female with known stiff man person syndrome who admits to having recent panic attacks or last few weeks most likely secondary to not receiving her diazepam. Patient has also been having increased muscle spasms which has brought her to the hospital today. There is a reported dizziness however she states the dizziness is more secondary to her panic attack.  Recommend: 1) diazepam 10 mg 3 times a day as this was what she was receiving as an outpatient and was working well for her. 2) we'll continue to monitor patient however due to the multiple muscle spasms if the diazepam is of no benefit she may need a trial of IVIG while inpatient.

## 2017-01-02 NOTE — Telephone Encounter (Signed)
The patient did not show for a revisit appointment today, she was admitted to the hospital yesterday with severe spasms.  I discussed medical management with the consulting neurologist. The patient has a history of severe spasms in the past with rhabdomyolysis. Patient will initially require high-dose diazepam. She has a history of cocaine abuse, urine drug screen is pending.

## 2017-01-02 NOTE — H&P (Signed)
History and Physical    Sherri [name] QMV:[id number] DOB: 29-Apr-1966 DOA: 01/01/2017   PCP: Gwynneth Alimentobyn N Sanders, MD   Patient coming from:  Home  Chief Complaint: Dysphagia and Back pain   HPI: Sherri [name] is a 51 y.o. female with multiple medical issues listed below including stiff person syndrome,herniated L4-S1 discs,pernicious anemia on B12 injections, polysubstance abuse, depression, chronic diarrhea, history of DVT not on anticoagulation, GERD, recent UTI 12/28, presenting to the ED with dizziness, back spasm. Pain was described as severe, lasting less than a minute and then returning. Episode frequency is about 2-3 x a monthShe also reports difficulty swallowing without vomiting. She reports as if food is stuck but is still able to tolerate solids, although favors liquids. No abdominal pain or blood in the stools. Denies any dysuria or gross hematuria.  No sick contacts. Denies fever or chills.  Denies respiratory or cardiac complaints. Denies headaches or vision changes. Denies seizures  She was seen at the ED with same complaints. She was to be seen by ENT (Dr. Haroldine Lawsrossley) as OP for further evaluation of her throat complaints. She was also due to be seen by Neuro (Dr. Anne HahnWillis) today. She was discharged in stable condition after IV fluids and pan meds were given    ED Course:  BP 184/94   Pulse (!) 129   Temp 98.1 F (36.7 C) (Oral)   Resp 19   LMP 07/29/2012   SpO2 99%   sodium 142 potassium 3.6 glucose 116 creatinine 1.2 CK 137 white count 6.5 hemoglobin 12.4 platelets 268 Today, she underwent a  CT soft tissue neck negative for masses. She is receiving Robaxin at the time of examination. She is reporting significant pain at this timee   Review of Systems: As per HPI otherwise 10 point review of systems negative.   Past Medical History:  Diagnosis Date  . Angioedema   . Anxiety   . Arthritis   . Asthma   . CAP (community acquired pneumonia) 08/29/2012  . Chronic back pain   .  Chronic kidney disease   . Cocaine abuse   . Depression   . Diarrhea   . DVT (deep venous thrombosis) (HCC)   . Environmental allergies   . Fall   . GERD (gastroesophageal reflux disease)   . Heart murmur    has been told once that she has a heart murmur, but has never had any problems  . Hiatal hernia 03/21/2013  . Lumbar herniated disc   . Peripheral vascular disease (HCC)   . Pernicious anemia 10/30/2011  . Postconcussion syndrome 12/30/2014  . Seasonal allergies   . Stiff person syndrome   . Urticaria     Past Surgical History:  Procedure Laterality Date  . BUNIONECTOMY Left   . colonoscopy  07/09/15  . inguinal hernia 1983  1983  . OPEN REDUCTION INTERNAL FIXATION (ORIF) DISTAL RADIAL FRACTURE Right 08/16/2016   Procedure: OPEN REDUCTION INTERNAL FIXATION (ORIF) DISTAL RADIAL FRACTURE;  Surgeon: Eldred MangesMark C Yates, MD;  Location: [facility] OR;  Service: Orthopedics;  Laterality: Right;    Social History Social History   Social History  . Marital status: Divorced    Spouse name: N/A  . Number of children: 1  . Years of education: college   Occupational History  . disabled    Social History Main Topics  . Smoking status: Never Smoker  . Smokeless tobacco: Never Used     Comment: Never Used Tobacco  . Alcohol use 0.0 oz/week  Comment: Occasional  . Drug use:     Types: Cocaine     Comment: last use was 05/2016  . Sexual activity: Not on file   Other Topics Concern  . Not on file   Social History Narrative   Patient is right and left handed.   Patient drinks some caffeine occasionally.        Allergies  Allergen Reactions  . Ibuprofen Other (See Comments)    Does not take due to hx of renal insufficiency  . Tylenol [Acetaminophen] Hives    Cannot take large quantities  . Lemon Flavor Swelling    Severe Lip Swelling   . Amoxicillin Diarrhea    Family History  Problem Relation Age of Onset  . Cancer Mother   . Other Mother   . COPD Father   . Asthma  Brother   . Cancer Brother     colon  . Heart attack Brother   . Seizures Brother       Prior to Admission medications   Medication Sig Start Date End Date Taking? Authorizing Provider  albuterol (PROVENTIL HFA;VENTOLIN HFA) 108 (90 BASE) MCG/ACT inhaler Inhale 1 puff into the lungs every 6 (six) hours as needed. PRO AIR 06/20/13   Waymon Budge, MD  baclofen (LIORESAL) 20 MG tablet 1.5 tablets in the morning and evening, one tablet at midday 08/22/16   York Spaniel, MD  cholecalciferol (VITAMIN D) 1000 UNITS tablet Take 1,000 Units by mouth daily.    Historical Provider, MD  Cholecalciferol (VITAMIN D3) 5000 units CAPS Take by mouth.    Historical Provider, MD  cyanocobalamin (,VITAMIN B-12,) 1000 MCG/ML injection Inject 1,000 mcg into the muscle every 3 (three) months.     Historical Provider, MD  desloratadine (CLARINEX) 5 MG tablet Take 1 tablet (5 mg total) by mouth daily. 11/06/11   Waymon Budge, MD  diclofenac sodium (VOLTAREN) 1 % GEL Apply 2 g topically 4 (four) times daily. 10/02/14   Ozella Rocks, MD  diphenhydrAMINE (BENADRYL) 25 MG tablet Take 25 mg by mouth at bedtime as needed for itching or allergies.     Historical Provider, MD  EPIPEN 2-PAK 0.3 MG/0.3ML SOAJ injection USE AS DIRECTED FOR  SEVERE  ALLERGIC  REACTIONS 08/16/15   Waymon Budge, MD  FLUoxetine (PROZAC) 10 MG capsule Take 10 mg by mouth daily.    Historical Provider, MD  Fluticasone-Salmeterol (ADVAIR) 250-50 MCG/DOSE AEPB Inhale 1 puff into the lungs 2 (two) times daily.  03/14/12   Waymon Budge, MD  gabapentin (NEURONTIN) 300 MG capsule 2 capsules in the morning and evening, one capsule at midday 08/22/16   York Spaniel, MD  levocetirizine (XYZAL) 5 MG tablet Take 1 tablet (5 mg total) by mouth every evening. 11/06/11   Waymon Budge, MD  loratadine (CLARITIN) 10 MG tablet Take 10 mg by mouth daily.     Historical Provider, MD  meclizine (ANTIVERT) 25 MG tablet  12/20/16   Historical Provider,  MD  montelukast (SINGULAIR) 10 MG tablet Take 10 mg by mouth at bedtime.    Historical Provider, MD  Morphine-Naltrexone (EMBEDA) 20-0.8 MG CPCR Take by mouth.    Historical Provider, MD  OVER THE COUNTER MEDICATION I B Guard    Historical Provider, MD  traMADol (ULTRAM) 50 MG tablet Take 50 mg by mouth every 6 (six) hours as needed for moderate pain.    Historical Provider, MD    Physical Exam:    Vitals:  01/02/17 0430 01/02/17 0438 01/02/17 0500 01/02/17 0725  BP: 116/80 129/90 145/99 184/94  Pulse: 65 78 98 (!) 129  Resp:  19    Temp:      TempSrc:      SpO2: 100% 99% 100% 99%       Constitutional:very uncomfortable due to pain, ill appearing  Vitals:   01/02/17 0430 01/02/17 0438 01/02/17 0500 01/02/17 0725  BP: 116/80 129/90 145/99 184/94  Pulse: 65 78 98 (!) 129  Resp:  19    Temp:      TempSrc:      SpO2: 100% 99% 100% 99%   Eyes: PERRL, lids and conjunctivae normal ENMT: Mucous membranes are moist. Posterior pharynx clear of any exudate or lesions.Normal dentition.  Neck: normal, supple, no masses, no thyromegaly Respiratory: clear to auscultation bilaterally, no wheezing, no crackles. Normal respiratory effort. No accessory muscle use.  Cardiovascular: tachy, 2/6 systolic murmur no  / rubs / gallops. No extremity edema. 2+ pedal pulses. No carotid bruits.  Abdomen: no tenderness, no masses palpated. No hepatosplenomegaly. Bowel sounds positive.  Musculoskeletal: no clubbing / cyanosis. No joint deformity upper and lower extremities. Good ROM, no contractures. Normal muscle tone. Cannot reproduce her back pain  Skin: no rashes, lesions, ulcers.  Neurologic: CN 2-12 grossly intact. Sensation intact, DTR normal. Strength 5/5 in all 4.  Psychiatric: Normal judgment and insight. Alert and oriented x 3. Normal mood.     Labs on Admission: I have personally reviewed following labs and imaging studies  CBC:  Recent Labs Lab 12/26/16 1002 12/31/16 1458  WBC  7.4 6.5  NEUTROABS 5.7  --   HGB 12.5 12.4  HCT 37.5 36.5  MCV 86 85.1  PLT 265 268    Basic Metabolic Panel:  Recent Labs Lab 12/26/16 1002 12/31/16 1458  NA 143 142  K 4.0 3.6  CL 106 108  CO2 26 26  GLUCOSE 157* 116*  BUN 16 6  CREATININE 1.1 1.20*  CALCIUM 10.2 10.3    GFR: Estimated Creatinine Clearance: 45.4 mL/min (by C-G formula based on SCr of 1.2 mg/dL (H)).  Liver Function Tests:  Recent Labs Lab 12/26/16 1002  AST 28  ALT 21  ALKPHOS 91*  BILITOT 0.60  PROT 8.2*  ALBUMIN 4.3   No results for input(s): LIPASE, AMYLASE in the last 168 hours. No results for input(s): AMMONIA in the last 168 hours.  Coagulation Profile: No results for input(s): INR, PROTIME in the last 168 hours.  Cardiac Enzymes:  Recent Labs Lab 01/02/17 0343  CKTOTAL 137    BNP (last 3 results) No results for input(s): PROBNP in the last 8760 hours.  HbA1C: No results for input(s): HGBA1C in the last 72 hours.  CBG: No results for input(s): GLUCAP in the last 168 hours.  Lipid Profile: No results for input(s): CHOL, HDL, LDLCALC, TRIG, CHOLHDL, LDLDIRECT in the last 72 hours.  Thyroid Function Tests: No results for input(s): TSH, T4TOTAL, FREET4, T3FREE, THYROIDAB in the last 72 hours.  Anemia Panel: No results for input(s): VITAMINB12, FOLATE, FERRITIN, TIBC, IRON, RETICCTPCT in the last 72 hours.  Urine analysis:    Component Value Date/Time   COLORURINE YELLOW 12/21/2016 1253   APPEARANCEUR CLEAR 12/21/2016 1253   LABSPEC 1.014 12/21/2016 1253   PHURINE 7.0 12/21/2016 1253   GLUCOSEU NEGATIVE 12/21/2016 1253   HGBUR NEGATIVE 12/21/2016 1253   BILIRUBINUR NEGATIVE 12/21/2016 1253   KETONESUR NEGATIVE 12/21/2016 1253   PROTEINUR NEGATIVE 12/21/2016 1253  UROBILINOGEN 0.2 12/18/2014 0400   NITRITE NEGATIVE 12/21/2016 1253   LEUKOCYTESUR LARGE (A) 12/21/2016 1253    Sepsis Labs: @LABRCNTIP (procalcitonin:4,lacticidven:4) )No results found for this or  any previous visit (from the past 240 hour(s)).   Radiological Exams on Admission: Ct Soft Tissue Neck W Contrast  Result Date: 12/31/2016 CLINICAL DATA:  Full feeling right-sided throat. Possible food impaction EXAM: CT NECK WITH CONTRAST TECHNIQUE: Multidetector CT imaging of the neck was performed using the standard protocol following the bolus administration of intravenous contrast. CONTRAST:  75mL ISOVUE-300 IOPAMIDOL (ISOVUE-300) INJECTION 61% COMPARISON:  None. FINDINGS: Pharynx and larynx: Nasopharynx normal. Mild hypertrophy of the tonsils without mass or abscess. Left piriform sinus is not aerated. Right piriform sinuses normally aerated. No definite mass in the left piriform sinus although this could be due to neoplasm or collapse or secretions. Normal vocal cords. Salivary glands: No inflammation, mass, or stone. Thyroid: Negative Lymph nodes: Negative Vascular: Carotid artery and jugular vein patent bilaterally. Limited intracranial: Negative Visualized orbits: Negative Mastoids and visualized paranasal sinuses: Negative Skeleton: Cervical spine degenerative change. No acute skeletal abnormality. Poor dentition. Upper chest: Lung apices clear Other: None IMPRESSION: Non aerated left piriform sinus which is asymmetric. Direct visualization recommended to exclude mass or edema in this area. Otherwise no evidence of vocal cord paresis. No retained foreign body. Electronically Signed   By: Marlan Palau M.D.   On: 12/31/2016 17:35    EKG: Independently reviewed.  Assessment/Plan Active Problems:   Spasm of muscle   Stiff person syndrome   Asthma with bronchitis   Pernicious anemia   Chronic back pain   Rhabdomyolysis   ARF (acute renal failure) (HCC)   Anemia, iron deficiency   Dizziness   Depression   History of DVT (deep vein thrombosis)   Chronic kidney disease   Chronic pain syndrome   Cocaine abuse  Stiff person syndrome, recurrent presentation with same over the last 2 days,  in a patient with chronic pain syndrome .  Receiving Robaxin. Monitoring her symptoms. H/o Rhabdo in 2013 but today  CK 137. WBC normal. Afebrile, VSS, Osats normal.  She was to be seen  As OP by Dr. Anne Hahn today, will inform his office this morning, patient may benefit from being seen in consultation while here.  Continue other supportive therapy for the time being  Continue Neurontin and home ultram   Dysphagia in a patient with a history of GERD not on PPI  Last CXR 12/21/16 showed moderate sized hiatal hernia without active disease  which may be contributing to her symptoms. Patient never had upper endoscopy  PPI Liquid diet advance as tolerated GI eval for possible upper endoscopy   Lightheadedness, this is chronic, seen in the past for same as outpatient by Neuroo. Patient's last MRI in 2013 raised a possibility of MS. Patient denies any vertigo, vomiting, headaches or double vision. No unilateral weakness . Gait unable to be tested as patient is in severe pain with guarding.  Check MRI brain while in hospital  Will contact Neuro for this issue as well Continue antivert  Depression Continue home Prozac  Chronic diarrhea, without signs of infection. Hb normal. No visible blood. Afebrile. WBC normal. Last colonoscopy 2007 unremarkable  Start probiotics  Imodium prn   History of cocaine abuse, she denies any recent use Check UDS   History of asthma without exacerbation Osats nrmal in RA . CXR 12/28 negative for acute disease  Continue home inhalers.   Pernicious anemia / Iron  deficiency anemia Hb 12.4 stable  On B12 injections as OP.  Repeat CBC in am  Chronic kidney disease stage 2 baseline creatinine1.1     Current Cr 1.2, in view of poor oral intake  Lab Results  Component Value Date   CREATININE 1.20 (H) 12/31/2016   CREATININE 1.1 12/26/2016   CREATININE 1.44 (H) 12/21/2016  IVF Repeat CMET in am    DVT prophylaxis: Lovenox Code Status:   Full     Family  Communication:  Discussed with patient Disposition Plan: Expect patient to be discharged to home after condition improves Consults called:  Neuro and GI  Admission status:Medsurg     [facility] E, PA-C [facility]   01/02/2017, 8:16 AM

## 2017-01-02 NOTE — ED Notes (Signed)
Pt given meal tray.

## 2017-01-02 NOTE — ED Notes (Signed)
Pt called out screaming for help. She stated she was having spasms. Episode lasted less than a minute. Pt sts she has a hx of such. She sts it happens when she has not taken her Baclofen (30 mg 3 times a day), is dehydrate &/or stressed. She sts she has been stressed lately, has "stiff persons syndrome" & herniated discs between L4 & S1.

## 2017-01-02 NOTE — ED Notes (Signed)
Regular Diet was ordered for patient for Lunch.

## 2017-01-02 NOTE — Consult Note (Signed)
Reason for Consult: Dysphagia Referring Physician: [facility]  [name] HPI: This is a 51 year old female with a PMH of stiff person syndrome, pernicious anemia, polysubstance abuse, depression, GERD, and history of DVT admitted for complaints of dysphagia and back pain.  Her dysphagia occurs intermittently and it is mostly to solid foods, however, she can tolerate solids and liquids in general.  She initially presented on Sunday with these symptoms and then represented today with dizziness.  Surprisingly, her dysphagia complaint resolved as she was able to eat solid food in the form of a potato without any difficulty.  Past Medical History:  Diagnosis Date  . Angioedema   . Anxiety   . Arthritis   . Asthma   . CAP (community acquired pneumonia) 08/29/2012  . Chronic back pain   . Chronic kidney disease   . Cocaine abuse   . Depression   . Diarrhea   . DVT (deep venous thrombosis) (HCC)   . Environmental allergies   . Fall   . GERD (gastroesophageal reflux disease)   . Heart murmur    has been told once that she has a heart murmur, but has never had any problems  . Hiatal hernia 03/21/2013  . Lumbar herniated disc   . Peripheral vascular disease (HCC)   . Pernicious anemia 10/30/2011  . Postconcussion syndrome 12/30/2014  . Seasonal allergies   . Stiff person syndrome   . Urticaria     Past Surgical History:  Procedure Laterality Date  . BUNIONECTOMY Left   . colonoscopy  07/09/15  . inguinal hernia 1983  1983  . OPEN REDUCTION INTERNAL FIXATION (ORIF) DISTAL RADIAL FRACTURE Right 08/16/2016   Procedure: OPEN REDUCTION INTERNAL FIXATION (ORIF) DISTAL RADIAL FRACTURE;  Surgeon: Eldred Manges, MD;  Location: [facility] OR;  Service: Orthopedics;  Laterality: Right;    Family History  Problem Relation Age of Onset  . Cancer Mother   . Other Mother   . COPD Father   . Asthma Brother   . Cancer Brother     colon  . Heart attack Brother   . Seizures Brother     Social  History:  reports that she has never smoked. She has never used smokeless tobacco. She reports that she drinks alcohol. She reports that she uses drugs, including Cocaine.  Allergies:  Allergies  Allergen Reactions  . Ibuprofen Other (See Comments)    Does not take due to hx of renal insufficiency  . Tylenol [Acetaminophen] Hives    Cannot take large quantities  . Lemon Flavor Swelling    Severe Lip Swelling   . Amoxicillin Diarrhea    Medications:  Scheduled: . FLUoxetine  10 mg Oral Daily  . gabapentin  600 mg Oral BID  . lactobacillus acidophilus  2 tablet Oral BID  . loratadine  10 mg Oral Daily  . LORazepam      . pantoprazole (PROTONIX) IV  40 mg Intravenous Q24H   Continuous: . sodium chloride 125 mL/hr at 01/02/17 0927  . sodium chloride 100 mL/hr at 01/02/17 1027  . methocarbamol (ROBAXIN)  IV      Results for orders placed or performed during the hospital encounter of 01/01/17 (from the past 24 hour(s))  CK     Status: None   Collection Time: 01/02/17  3:43 AM  Result Value Ref Range   Total CK 137 38 - 234 U/L     Ct Soft Tissue Neck W Contrast  Result Date: 12/31/2016 CLINICAL DATA:  Full feeling right-sided throat. Possible food impaction EXAM: CT NECK WITH CONTRAST TECHNIQUE: Multidetector CT imaging of the neck was performed using the standard protocol following the bolus administration of intravenous contrast. CONTRAST:  75mL ISOVUE-300 IOPAMIDOL (ISOVUE-300) INJECTION 61% COMPARISON:  None. FINDINGS: Pharynx and larynx: Nasopharynx normal. Mild hypertrophy of the tonsils without mass or abscess. Left piriform sinus is not aerated. Right piriform sinuses normally aerated. No definite mass in the left piriform sinus although this could be due to neoplasm or collapse or secretions. Normal vocal cords. Salivary glands: No inflammation, mass, or stone. Thyroid: Negative Lymph nodes: Negative Vascular: Carotid artery and jugular vein patent bilaterally. Limited  intracranial: Negative Visualized orbits: Negative Mastoids and visualized paranasal sinuses: Negative Skeleton: Cervical spine degenerative change. No acute skeletal abnormality. Poor dentition. Upper chest: Lung apices clear Other: None IMPRESSION: Non aerated left piriform sinus which is asymmetric. Direct visualization recommended to exclude mass or edema in this area. Otherwise no evidence of vocal cord paresis. No retained foreign body. Electronically Signed   By: Marlan Palauharles  Clark M.D.   On: 12/31/2016 17:35    ROS:  As stated above in the HPI otherwise negative.  Blood pressure (!) 167/110, pulse (!) 126, temperature 98.1 F (36.7 C), temperature source Oral, resp. rate 16, last menstrual period 07/29/2012, SpO2 99 %.    PE: Gen: NAD, Alert and Oriented HEENT:  Lane/AT, EOMI Neck: Supple, no LAD Lungs: CTA Bilaterally CV: RRR without M/G/R ABM: Soft, NTND, +BS Ext: No C/C/E  Assessment/Plan: 1) Dysphagia - Resolved. 2) Back pain. 3) History of cocaine abuse 12/18/2014.   Her GI symptoms with dysphagia has resolved.  No issues with nausea, vomiting, or weight loss.  This appears to be an isolated event.  No further inpatient GI work up is required.  She can follow up with Dr. Loreta AveMann on an outpatient basis if necessary.  Plan: 1) Continue with supportive care. 2) Follow up with Dr. Loreta AveMann, as an outpatient, if needed. 3) Signing off.  Sherri [name] D 01/02/2017, 11:02 AM

## 2017-01-03 ENCOUNTER — Encounter: Payer: Self-pay | Admitting: Neurology

## 2017-01-03 DIAGNOSIS — D509 Iron deficiency anemia, unspecified: Secondary | ICD-10-CM

## 2017-01-03 DIAGNOSIS — D510 Vitamin B12 deficiency anemia due to intrinsic factor deficiency: Secondary | ICD-10-CM

## 2017-01-03 DIAGNOSIS — M62838 Other muscle spasm: Secondary | ICD-10-CM

## 2017-01-03 LAB — COMPREHENSIVE METABOLIC PANEL
ALBUMIN: 3.7 g/dL (ref 3.5–5.0)
ALK PHOS: 87 U/L (ref 38–126)
ALT: 16 U/L (ref 14–54)
ANION GAP: 6 (ref 5–15)
AST: 19 U/L (ref 15–41)
BUN: 9 mg/dL (ref 6–20)
CALCIUM: 9.7 mg/dL (ref 8.9–10.3)
CO2: 25 mmol/L (ref 22–32)
Chloride: 111 mmol/L (ref 101–111)
Creatinine, Ser: 1.19 mg/dL — ABNORMAL HIGH (ref 0.44–1.00)
GFR calc non Af Amer: 52 mL/min — ABNORMAL LOW (ref >60)
GFR, EST AFRICAN AMERICAN: 60 mL/min — AB (ref >60)
GLUCOSE: 82 mg/dL (ref 65–99)
POTASSIUM: 4 mmol/L (ref 3.5–5.1)
Sodium: 142 mmol/L (ref 135–145)
Total Bilirubin: 0.8 mg/dL (ref 0.3–1.2)
Total Protein: 6.6 g/dL (ref 6.5–8.1)

## 2017-01-03 LAB — CBC
HEMATOCRIT: 32.8 % — AB (ref 36.0–46.0)
HEMOGLOBIN: 10.6 g/dL — AB (ref 12.0–15.0)
MCH: 27.8 pg (ref 26.0–34.0)
MCHC: 32.3 g/dL (ref 30.0–36.0)
MCV: 86.1 fL (ref 78.0–100.0)
Platelets: 234 10*3/uL (ref 150–400)
RBC: 3.81 MIL/uL — AB (ref 3.87–5.11)
RDW: 13.3 % (ref 11.5–15.5)
WBC: 10.5 10*3/uL (ref 4.0–10.5)

## 2017-01-03 MED ORDER — ENOXAPARIN SODIUM 40 MG/0.4ML ~~LOC~~ SOLN
40.0000 mg | SUBCUTANEOUS | Status: DC
  Administered 2017-01-03 – 2017-01-04 (×2): 40 mg via SUBCUTANEOUS
  Filled 2017-01-03 (×2): qty 0.4

## 2017-01-03 MED ORDER — PANTOPRAZOLE SODIUM 40 MG PO TBEC
40.0000 mg | DELAYED_RELEASE_TABLET | Freq: Every day | ORAL | Status: DC
  Administered 2017-01-04: 40 mg via ORAL
  Filled 2017-01-03: qty 1

## 2017-01-03 MED ORDER — DIAZEPAM 5 MG PO TABS
15.0000 mg | ORAL_TABLET | Freq: Three times a day (TID) | ORAL | Status: DC
  Administered 2017-01-03 – 2017-01-04 (×3): 15 mg via ORAL
  Filled 2017-01-03 (×3): qty 3

## 2017-01-03 NOTE — Evaluation (Signed)
Occupational Therapy Evaluation Patient Details Name: Sherri SlimmerSamantha Delker MRN: [id number] DOB: 1966-05-20 Today's Date: 01/03/2017    History of Present Illness 51 y.o. female with multiple medical issues listed below including stiff person syndrome,herniated L4-S1 discs,pernicious anemia on B12 injections, polysubstance abuse, depression, chronic diarrhea, history of DVT not on anticoagulation, GERD, recent UTI 12/28, presenting to the ED with dizziness, back spasm.    Clinical Impression   Pt reports she was managing ADL independently PTA. Currently pt requires min assist for basic transfers and ADL. Pt presenting with bil LE spasms noted sitting EOB and pt reports hx of vertigo resulting in multiple falls over the past few months. Pt planning to d/c home alone with intermittent family supervision. Recommending HHOT for follow up to maximize independence and safety with ADL and functional mobility upon return home. Pt would benefit from continued skilled OT to address established goals.    Follow Up Recommendations  Home health OT;Supervision/Assistance - 24 hour    Equipment Recommendations  Tub/shower seat    Recommendations for Other Services PT consult     Precautions / Restrictions Precautions Precautions: Fall Precaution Comments: pt reports hx of vertigo, bil LE spasms Restrictions Weight Bearing Restrictions: No      Mobility Bed Mobility Overal bed mobility: Needs Assistance Bed Mobility: Supine to Sit;Sit to Supine     Supine to sit: Min guard Sit to supine: Min assist   General bed mobility comments: Min assist for LEs back to bed. HOB slightly elevated with use of bed rail. Increased time required.  Transfers Overall transfer level: Needs assistance Equipment used: 1 person hand held assist Transfers: Sit to/from Stand Sit to Stand: Min assist         General transfer comment: Light min assist for steadying balance in standing.    Balance Overall  balance assessment: Needs assistance Sitting-balance support: Feet supported;No upper extremity supported Sitting balance-Leahy Scale: Good     Standing balance support: Single extremity supported Standing balance-Leahy Scale: Fair                              ADL Overall ADL's : Needs assistance/impaired Eating/Feeding: Set up;Sitting   Grooming: Set up;Supervision/safety;Sitting   Upper Body Bathing: Set up;Supervision/ safety;Sitting   Lower Body Bathing: Minimal assistance;Sit to/from stand   Upper Body Dressing : Set up;Supervision/safety;Sitting   Lower Body Dressing: Minimal assistance;Sit to/from stand   Toilet Transfer: Minimal Chartered loss adjusterassistance;Stand-pivot;BSC Toilet Transfer Details (indicate cue type and reason): Simulated by sit to stand from EOB with side stepping toward [facility] for repositioning.         Functional mobility during ADLs: Minimal assistance General ADL Comments: Pt reports hx of vertigo and bil LE spasms resulting in multiple falls at home over the past few months. Pt asymptomatic this session but did note bil LE spasms while sitting EOB.      Vision     Perception     Praxis      Pertinent Vitals/Pain Pain Assessment: No/denies pain     Hand Dominance     Extremity/Trunk Assessment Upper Extremity Assessment Upper Extremity Assessment: Overall WFL for tasks assessed   Lower Extremity Assessment Lower Extremity Assessment: Defer to PT evaluation   Cervical / Trunk Assessment Cervical / Trunk Assessment: Normal   Communication Communication Communication: No difficulties   Cognition Arousal/Alertness: Awake/alert Behavior During Therapy: WFL for tasks assessed/performed Overall Cognitive Status: Within Functional Limits for tasks assessed  General Comments       Exercises       Shoulder Instructions      Home Living Family/patient expects to be discharged to:: Private residence Living  Arrangements: Alone Available Help at Discharge: Family;Available PRN/intermittently Type of Home: Apartment Home Access: Level entry     Home Layout: One level     Bathroom Shower/Tub: Tub/shower unit Shower/tub characteristics: Curtain Firefighter: Standard     Home Equipment: Environmental consultant - 2 wheels;Cane - single point          Prior Functioning/Environment Level of Independence: Independent with assistive device(s)        Comments: Cane for mobility, RW recently.        OT Problem List: Decreased strength;Impaired balance (sitting and/or standing);Decreased knowledge of use of DME or AE;Decreased knowledge of precautions   OT Treatment/Interventions: Self-care/ADL training;Energy conservation;DME and/or AE instruction;Therapeutic activities;Patient/family education;Balance training    OT Goals(Current goals can be found in the care plan section) Acute Rehab OT Goals Patient Stated Goal: get back to being independent OT Goal Formulation: With patient Time For Goal Achievement: 01/17/17 Potential to Achieve Goals: Good ADL Goals Pt Will Perform Grooming: with supervision;standing Pt Will Perform Upper Body Bathing: sitting;with set-up Pt Will Perform Lower Body Bathing: with supervision;sit to/from stand Pt Will Perform Upper Body Dressing: with set-up;sitting Pt Will Perform Lower Body Dressing: with supervision;sit to/from stand Pt Will Transfer to Toilet: with supervision;ambulating;regular height toilet Pt Will Perform Toileting - Clothing Manipulation and hygiene: with supervision;sit to/from stand Pt Will Perform Tub/Shower Transfer: Tub transfer;with supervision;ambulating;shower seat;rolling walker  OT Frequency: Min 2X/week   Barriers to D/C: Decreased caregiver support  pt lives alone       Co-evaluation              End of Session    Activity Tolerance: Patient tolerated treatment well Patient left: in bed;with call bell/phone within  reach;with bed alarm set   Time: 1657-1720 OT Time Calculation (min): 23 min Charges:  OT General Charges $OT Visit: 1 Procedure OT Evaluation $OT Eval Moderate Complexity: 1 Procedure OT Treatments $Self Care/Home Management : 8-22 mins G-Codes:     Gaye Alken M.S., OTR/L Pager: [phone]  01/03/2017, 5:36 PM

## 2017-01-03 NOTE — Progress Notes (Signed)
Subjective: Patient is doing well with current time. Her spasms have lessened but she still having them multiple times back to back. Patient states that she was on 20 mg 3 times a day. I did call [facility] and her last prescription fill was January 2017 and states that she was on 20 mg 3 times daily.  Exam: Vitals:   01/02/17 2046 01/03/17 0527  BP: (!) 152/71 118/76  Pulse: (!) 114 90  Resp: 17 18  Temp: 98 F (36.7 C) 98.3 F (36.8 C)        Gen: In bed, NAD MS: Alert and oriented, follows all commands, speech is clear CN: Cranial nerves II through XII are grossly intact Motor: Moving all extremities with 5 out of 5 strength spontaneously Sensory: Intact throughout   Pertinent Labs/Diagnostics: MRI brain was stable.  Felicie MornDavid Kyndal Gloster PA-C [facility] [phone]  Impression: This is a 51 year old female with known stiff man person syndrome who admits to recently having panic Last week most likely secondary to not having her Valium. It has been confirmed that she was on 20 mg 3 times a day of Valium for her muscle spasms. However given the fact that she's been off this for some time I'm hesitant to start her on that high of a dose right away. I have increased her Valium to 15 mg 3 times a day we'll check on her later today. If patient is not too sedated with all the medications she is on. We'll likely increase to 20 mg 3 times a day as this is what she was on prior.     01/03/2017, 9:19 AM

## 2017-01-03 NOTE — Progress Notes (Signed)
PROGRESS NOTE                                                                                                                                                                                                             Patient Demographics:    Sherri [name], is a 51 y.o. female, DOB - 1966/11/26, ZOX:[id number]  Admit date - 01/01/2017   Admitting Physician Ozella Rocks, MD  Outpatient Primary MD for the patient is Gwynneth Aliment, MD  LOS - 1  Outpatient Specialists: Dr Anne Hahn  Chief Complaint  Patient presents with  . Dizziness  . Back Pain       Brief Narrative   51 year old female with history of stiff person syndrome, herniated L4-S1 disc, perhaps his anemia requiring B12 injection, post substance abuse, depression, chronic diarrhea, history of DVT noted and required lesion, GERD, recent UTI presented to the ED with dizziness and back spasms. Patient describes severe pain lasting less than a minute with recurrence. Patient also reported difficulty swallowing without vomiting with symptoms of food sticking in her esophagus. Also reports frequent panic attacks. Patient informs that her neurologist took her off diazepam and stopped refilling them. When my partner called Dr. Anne Hahn he informed that he had not cancel her diazepam since she was having good benefit from it.  Admitted to hospitalist service for further management. Neurology consulted.     Subjective:   Patient complains of low back pain and periodic spasms. No further dysphagia and tolerating solid diet.   Assessment  & Plan :   Principal problem Stiff person syndrome with recurrent symptoms Resumed diazepam. Seen by neurology and increased diazepam dose to 15 mg twice a day. Verified from pharmacy and she was on 20 mg twice a day (but was off it for past several days). Continue Neurontin at home dose tramadol. Increased to home dose Valium if  patient not sedated. (Patient was also placed on IV Robaxin thousand mg every 8 hrswhen necessary for muscle spasms)   Dysphagia with history of GERD Added PPI. Was able to tolerate solid food. Seen by GI and signed off.  Lightheadedness  chronic. Follows with neurologist as outpatient. MRI of the brain unremarkable.  Chronic depression Continue Prozac    Chronic diarrhea No signs of infection. Continue Imodium when necessary and probiotics  History of cocaine use  Reports to have quit. Urine drug screen negative  History of asthma Stable. Continue home inhaler  Chronic kidney disease stage II Renal function at baseline.   Pernicious anemia/iron deficiency On monthly B12 injections. Follows with Dr Myna Hidalgo    Code Status : full code  Family Communication  : None at bedside  Disposition Plan  : home since improved  Barriers For Discharge : Active symptoms  Consults  : Neurology  Procedures  : MRI brain  DVT Prophylaxis  :  Lovenox -  Lab Results  Component Value Date   PLT 234 01/03/2017    Antibiotics  :    Anti-infectives    None        Objective:   Vitals:   01/02/17 2001 01/02/17 2046 01/03/17 0527 01/03/17 1351  BP: 138/88 (!) 152/71 118/76 128/79  Pulse: 101 (!) 114 90 100  Resp: 18 17 18 18   Temp: 98.8 F (37.1 C) 98 F (36.7 C) 98.3 F (36.8 C) 98.2 F (36.8 C)  TempSrc: Oral Oral Oral Oral  SpO2: 98% 98% 100% 100%  Weight:   68.8 kg (151 lb 10.8 oz)   Height:  5\' 3"  (1.6 m)      Wt Readings from Last 3 Encounters:  01/03/17 68.8 kg (151 lb 10.8 oz)  12/21/16 62.1 kg (137 lb)  12/14/16 64.1 kg (141 lb 4 oz)     Intake/Output Summary (Last 24 hours) at 01/03/17 1650 Last data filed at 01/03/17 1549  Gross per 24 hour  Intake          2036.67 ml  Output              600 ml  Net          1436.67 ml     Physical Exam  Gen: not in distress HEENT:  moist mucosa, supple neck Chest: clear b/l, no added sounds CVS: N S1&S2, no  murmurs,  GI: soft, NT, ND, BS+ Musculoskeletal: warm, no edema CNS: Alert and oriented    Data Review:    CBC  Recent Labs Lab 12/31/16 1458 01/03/17 0740  WBC 6.5 10.5  HGB 12.4 10.6*  HCT 36.5 32.8*  PLT 268 234  MCV 85.1 86.1  MCH 28.9 27.8  MCHC 34.0 32.3  RDW 13.0 13.3    Chemistries   Recent Labs Lab 12/31/16 1458 01/03/17 0740  NA 142 142  K 3.6 4.0  CL 108 111  CO2 26 25  GLUCOSE 116* 82  BUN 6 9  CREATININE 1.20* 1.19*  CALCIUM 10.3 9.7  AST  --  19  ALT  --  16  ALKPHOS  --  87  BILITOT  --  0.8   ------------------------------------------------------------------------------------------------------------------ No results for input(s): CHOL, HDL, LDLCALC, TRIG, CHOLHDL, LDLDIRECT in the last 72 hours.  Lab Results  Component Value Date   HGBA1C 5.3 09/03/2012   ------------------------------------------------------------------------------------------------------------------ No results for input(s): TSH, T4TOTAL, T3FREE, THYROIDAB in the last 72 hours.  Invalid input(s): FREET3 ------------------------------------------------------------------------------------------------------------------ No results for input(s): VITAMINB12, FOLATE, FERRITIN, TIBC, IRON, RETICCTPCT in the last 72 hours.  Coagulation profile No results for input(s): INR, PROTIME in the last 168 hours.  No results for input(s): DDIMER in the last 72 hours.  Cardiac Enzymes No results for input(s): CKMB, TROPONINI, MYOGLOBIN in the last 168 hours.  Invalid input(s): CK ------------------------------------------------------------------------------------------------------------------ No results found for: BNP  Inpatient Medications  Scheduled Meds: . diazepam  15 mg Oral TID  . enoxaparin (LOVENOX) injection  40  mg Subcutaneous Q24H  . FLUoxetine  10 mg Oral Daily  . gabapentin  600 mg Oral BID  . lactobacillus acidophilus  2 tablet Oral BID  . loratadine  10 mg Oral  Daily  . pantoprazole  40 mg Oral Daily   Continuous Infusions: . sodium chloride 100 mL/hr at 01/03/17 0907   PRN Meds:.albuterol, bisacodyl, diphenhydrAMINE, magnesium citrate, meclizine, methocarbamol (ROBAXIN)  IV, ondansetron **OR** ondansetron (ZOFRAN) IV, senna-docusate, traMADol, traZODone  Micro Results No results found for this or any previous visit (from the past 240 hour(s)).  Radiology Reports Dg Chest 2 View  Result Date: 12/21/2016 CLINICAL DATA:  Shortness of breath, dizziness EXAM: CHEST  2 VIEW COMPARISON:  11/19/2015 FINDINGS: Moderate-sized hiatal hernia. Heart is normal size. No confluent airspace opacities or effusions. No acute bony abnormality. IMPRESSION: Moderate-sized hiatal hernia.  No active disease. Electronically Signed   By: Charlett NoseKevin  Dover M.D.   On: 12/21/2016 11:58   Ct Soft Tissue Neck W Contrast  Result Date: 12/31/2016 CLINICAL DATA:  Full feeling right-sided throat. Possible food impaction EXAM: CT NECK WITH CONTRAST TECHNIQUE: Multidetector CT imaging of the neck was performed using the standard protocol following the bolus administration of intravenous contrast. CONTRAST:  75mL ISOVUE-300 IOPAMIDOL (ISOVUE-300) INJECTION 61% COMPARISON:  None. FINDINGS: Pharynx and larynx: Nasopharynx normal. Mild hypertrophy of the tonsils without mass or abscess. Left piriform sinus is not aerated. Right piriform sinuses normally aerated. No definite mass in the left piriform sinus although this could be due to neoplasm or collapse or secretions. Normal vocal cords. Salivary glands: No inflammation, mass, or stone. Thyroid: Negative Lymph nodes: Negative Vascular: Carotid artery and jugular vein patent bilaterally. Limited intracranial: Negative Visualized orbits: Negative Mastoids and visualized paranasal sinuses: Negative Skeleton: Cervical spine degenerative change. No acute skeletal abnormality. Poor dentition. Upper chest: Lung apices clear Other: None IMPRESSION: Non  aerated left piriform sinus which is asymmetric. Direct visualization recommended to exclude mass or edema in this area. Otherwise no evidence of vocal cord paresis. No retained foreign body. Electronically Signed   By: Marlan Palauharles  Clark M.D.   On: 12/31/2016 17:35   Mr Laqueta JeanBrain W ZOWo Contrast  Result Date: 01/02/2017 CLINICAL DATA:  Dizziness. EXAM: MRI HEAD WITHOUT AND WITH CONTRAST TECHNIQUE: Multiplanar, multiecho pulse sequences of the brain and surrounding structures were obtained without and with intravenous contrast. CONTRAST:  15mL MULTIHANCE GADOBENATE DIMEGLUMINE 529 MG/ML IV SOLN COMPARISON:  09/03/2012 brain MRI FINDINGS: Brain: No acute infarction, hemorrhage, hydrocephalus, extra-axial collection or mass lesion. No significant white matter disease or atrophy. Thin section coronal T2 weighted imaging was performed, the hippocampi have symmetric normal signal. Vascular: Normal flow voids Skull and upper cervical spine: Hypointense appearance of cervical spine marrow, stable from 2013 when CBC was recommended. No focal marrow lesion. Sinuses/Orbits: Negative Other: Intermittently motion degraded exam which could obscure subtle findings. IMPRESSION: Stable and negative brain MRI. Electronically Signed   By: Marnee SpringJonathon  Watts M.D.   On: 01/02/2017 13:04    Time Spent in minutes  25   Eddie NorthHUNGEL, Nel Stoneking M.D on 01/03/2017 at 4:50 PM  Between 7am to 7pm - Pager - [phone]  After 7pm go to [email or web address] - password [email or web address]  [facility] -  Office  [phone]

## 2017-01-03 NOTE — Consult Note (Signed)
   [facility] CM Inpatient Consult   01/03/2017  Sherri [name] 27-Feb-1966 [id number]    Patient in the Medicare ACO Registry with 2 admission and 4 ED visits.  Per MD HPI: the patient, Sherri [name] is a 51 y.o. 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 MRI ordered w/ contrast. Some improvement after Robaxin so will continue this Q8 PRN.   Met with the patient at bedside.  Patient was awake and oriented.  Patient having very jerky movements noted. Patient states she has been managing with having friends and family help her to appointments because she has stopped driving as of December 17,2017.  She was receiving $15.00/month for Food Stamps but fears this may change this year.  She has used the church for [employer].  She has some difficulty cooking at times with her symptoms but has been able to manage.  She does verbalize the cost difficulty with her inhalers and Advair Disc. Explained [facility] Care Management services for community follow up.  She verbalized a desire but states, "I am just too jerky to sign, copy of consent given and will have her sign later when possible, [facility] package given as well.  [facility] Care Management to follow up post hospital visit. For questions, please contact:  Natividad Brood, RN BSN [facility] Liaison  [phone] business mobile phone Toll free office [phone]

## 2017-01-04 ENCOUNTER — Ambulatory Visit: Payer: Medicare Other

## 2017-01-04 ENCOUNTER — Telehealth: Payer: Self-pay | Admitting: Neurology

## 2017-01-04 MED ORDER — DIAZEPAM 5 MG PO TABS
20.0000 mg | ORAL_TABLET | Freq: Three times a day (TID) | ORAL | Status: DC
  Administered 2017-01-04: 20 mg via ORAL
  Filled 2017-01-04: qty 4

## 2017-01-04 MED ORDER — DIAZEPAM 5 MG PO TABS
5.0000 mg | ORAL_TABLET | ORAL | Status: AC
  Administered 2017-01-04: 5 mg via ORAL
  Filled 2017-01-04: qty 1

## 2017-01-04 MED ORDER — DIAZEPAM 10 MG PO TABS: 20.0000 mg | ORAL_TABLET | Freq: Three times a day (TID) | ORAL | 0 refills | Status: DC

## 2017-01-04 NOTE — Progress Notes (Signed)
Patient was discharged home by MD order; discharged instructions review and give to patient with care notes and prescriptions; IV DIC; skin intact; patient will be escorted to the car by nurse tech via wheelchair.  

## 2017-01-04 NOTE — Telephone Encounter (Signed)
Appt scheduled for next Wed 01/10/17 @ 8 a.m. Called to notify pt. She may call back to reschedule if needed.

## 2017-01-04 NOTE — Telephone Encounter (Signed)
The patient will be discharged from the hospital on diazepam, taking around 20 mg daily, she sees me doing well with this. Her CK enzyme levels never elevated. The patient was having severe muscle spasms, however.  The patient will follow-up through this office, we will need to continue the diazepam prescription.

## 2017-01-04 NOTE — Progress Notes (Signed)
PT Cancellation Note  Patient Details Name: Sherri [name] MRN: [id number] DOB: 1966/11/23   Cancelled Treatment:    Reason Eval/Treat Not Completed: Other (comment); patient reports to d/c today.  Just back to bed after toileting in bathroom with tech, feels safe for d/c home, reviewed equipment and follow up recommendations.  Will cancel today as planned d/c.   Elray McgregorCynthia Egor Fullilove 01/04/2017, 3:28 PM  Sheran Lawlessyndi Mayo Owczarzak, [place] [phone] 01/04/2017

## 2017-01-04 NOTE — Care Management Note (Addendum)
Case Management Note  Patient Details  Name: Laurena SlimmerSamantha Deshazo MRN: [id number] Date of Birth: [birth year]  Subjective/Objective:                 Spoke with patient. Patient is from home. States she suffers from panic attacks, describes fear of leaving house. CM promoted HH. Patient agreed and after [facility] and states she would benefit from [facility]1. Patient has walker and cane, [facility] need [facility] stating she wants to walk as long as she can. She states she spoke with Md and plan is DC tomorrow, her sister will pick her up.    Action/Plan:  [facility] And [facility]. Patient will have support of [facility] after DC.  Referrals made, 3in1 to be delivered to room prior to discharge   Expected Discharge Date:                  Expected Discharge Plan:  Home w Home Health Services  In-House Referral:     Discharge planning Services  CM Consult  Post Acute Care Choice:  Home Health, Durable Medical Equipment Choice offered to:  Patient  DME Arranged:    DME Agency:     HH Arranged:    HH Agency:     Status of Service:  In process, will continue to follow  If discussed at Long Length of Stay Meetings, dates discussed:    Additional Comments:  Lawerance SabalDebbie Lakeyta Vandenheuvel, RN 01/04/2017, 11:45 AM

## 2017-01-04 NOTE — Progress Notes (Signed)
Subjective: Feels much better but no back to baseline.   Exam: Vitals:   01/03/17 2139 01/04/17 0524  BP: (!) 130/91 137/79  Pulse: 97 91  Resp: 18 18  Temp: 98.5 F (36.9 C) 98 F (36.7 C)        Gen: In bed, NAD MS: alert and oriented CN: 2-12 intact Motor: moving all extremities Sensory: intact throught   Pertinent Labs/Diagnostics: none  Felicie MornDavid Nicolis Boody PA-C [facility] [phone]  Impression: This is a 51 year old female with known stiff man person syndrome who admits to recently having panic Last week most likely secondary to not having her Valium. It has been confirmed that she was on 20 mg 3 times a day of Valium for her muscle spasms. However given the fact that she's been off this for some time I'm hesitant to start her on that high of a dose right away. I have increased her Valium to 15 mg 3 times a day --today will increase to 20 mg TID as she is showing no sedation and feels this would help her get back to her baseline. She needs to follow up with Dr. Anne HahnWillis in the next week or two further evaluation of medications.      01/04/2017, 10:04 AM
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
## 2017-01-04 NOTE — Progress Notes (Deleted)
PT Cancellation Note  Patient Details Name: Sherri SlimmerSamantha [name] MRN: [id number] DOB: 1966/08/16   Cancelled Treatment:    Reason Eval/Treat Not Completed: Medical issues which prohibited therapy;Fatigue/lethargy limiting ability to participate.  Patient still nauseated after up with OT.  Reports some dizziness as well; educated in visual compensation.  Will follow up next date.   Elray McgregorCynthia Randale Carvalho 01/04/2017, 3:27 PM  Sheran Lawlessyndi Keylin Podolsky, PT [phone] 01/04/2017

## 2017-01-05 ENCOUNTER — Other Ambulatory Visit: Payer: Self-pay

## 2017-01-05 ENCOUNTER — Encounter: Payer: Self-pay | Admitting: *Deleted

## 2017-01-05 ENCOUNTER — Other Ambulatory Visit: Payer: Self-pay | Admitting: *Deleted

## 2017-01-05 DIAGNOSIS — G2582 Stiff-man syndrome: Secondary | ICD-10-CM

## 2017-01-05 NOTE — Patient Outreach (Addendum)
[facility] Customer service manager [facility]) Care Management [facility] Community CM Telephone Outreach, Transition of Care, day 1 01/05/2017  Devina Bezold Apr 02, 1966 [id number]  Successful telephone outreach to [name], 51 y/o female referred to [facility] CM for transition of care after multiple recent ED visit and hospital admission January 8-11, 2018 for stiff person syndrome with dizziness and back spasms.  Patient was discharged home with home health [facility]) services for PT/OT through [facility]).  Ms. Cuello has history including, but not limited to asthma, pernicious anemia, chronic back pain with DJD, ARF, DVT, depression, polysubstance abuse.  HIPAA/ identity verified with patient during phone call today, and patient provided verbal consent for [facility] Community CM involvement in her care.  Today, Ms. Olejnik reports she is "doing much better," and denies needs, concerns, issues, or problems.  Patient states that she is currently driving on an errand, and she denies community resource needs today, stating that she drives herself to appointments "most of the time," and has a supportive/ involved family that assist with her care as needed.  -- states has all of her medications and is taking as prescribed; denies questions about her medications, states that she is in contact with Dr. Anne Hahn, neurologist, regarding her medications.    -- reports that she has heard from [facility] but is waiting to set up initial services "until next week after things calm down after my hospital visit."  Patient was encouraged to schedule home visits with [facility] services as soon as possible, and she agreed to do so.  -- Reports has a follow up appointment with Dr. Anne Hahn, her "main" doctor scheduled for next week, but states that she is currently trying to re-schedule the time of the visit, as it is "too early in the morning."  States that she plans to contact Dr. Anne Hahn' office staff to re-schedule time of  visit, and possibly to have medications re-filled, depending on when visit is re-scheduled.  States that she saw PA at PCP office "sometimes about a month ago."  Encouraged to make hospital follow up visit with PCP.  -- High fall risk:  Patient reports several falls this year from "pain spasms in back," and "out of body-type experiences."  Denies serious injury with previous falls.  Fall prevention/ risks briefly discussed with patient today.  [facility] CM services discussed with patient today, including how [facility] CM differs from [facility] services.  Patient is unable to schedule [facility] Community initial home visit today as she is currently driving and does not have her calendar.  Agreed that I would call her next week to schedule [facility] Community CM initial home visit.  I provided patient with my direct phone number should concerns or issues arise prior to next [facility] CM call for transition of care; patient is unable to take phone numbers for main [facility] CM office/ 24-hour nurse advice line today, as she is driving, and she agrees to contact me if necessary prior to next scheduled call.  Plan:  Ms. Rojek will take her medications as they are prescribed and will attend all provider appointments.  Ms. Sobalvarro will contact her providers for any concerns, needs, issues, or problems that arise.  Ms. Edelen will actively participate in [facility] services for PT/ OT, as ordered post-hospital discharge.  I will make patient's PCP aware of [facility] Community CM involvement in patient's care.  [facility] Community CM outreach for transition of care to continue with scheduled telephone outreach next week.  Cherie Dark  Berneta Levinsousey, RN, BSN, ArboriculturistCCRN [employer] Community Care Coordinator [facility] Care Management  [phone]

## 2017-01-05 NOTE — Telephone Encounter (Signed)
Patient called requesting  is needing a later appt with Dr. Anne HahnWillis. Please call

## 2017-01-05 NOTE — Telephone Encounter (Signed)
Called pt to change appt, r/s to 01/19/17 @ 12. May call back again if she needs to reschedule.

## 2017-01-08 ENCOUNTER — Encounter: Payer: Self-pay | Admitting: *Deleted

## 2017-01-08 ENCOUNTER — Telehealth: Payer: Self-pay | Admitting: *Deleted

## 2017-01-08 NOTE — Telephone Encounter (Signed)
Pt called said she will be out of  diazepam (VALIUM) 10 MG tablet on 01/10/17. Please call

## 2017-01-09 ENCOUNTER — Other Ambulatory Visit: Payer: Self-pay | Admitting: *Deleted

## 2017-01-09 ENCOUNTER — Other Ambulatory Visit: Payer: Self-pay

## 2017-01-09 ENCOUNTER — Encounter: Payer: Self-pay | Admitting: *Deleted

## 2017-01-09 DIAGNOSIS — K5909 Other constipation: Secondary | ICD-10-CM | POA: Diagnosis not present

## 2017-01-09 DIAGNOSIS — M5126 Other intervertebral disc displacement, lumbar region: Secondary | ICD-10-CM | POA: Diagnosis not present

## 2017-01-09 DIAGNOSIS — G894 Chronic pain syndrome: Secondary | ICD-10-CM | POA: Diagnosis not present

## 2017-01-09 DIAGNOSIS — J45909 Unspecified asthma, uncomplicated: Secondary | ICD-10-CM | POA: Diagnosis not present

## 2017-01-09 DIAGNOSIS — D509 Iron deficiency anemia, unspecified: Secondary | ICD-10-CM | POA: Diagnosis not present

## 2017-01-09 DIAGNOSIS — D510 Vitamin B12 deficiency anemia due to intrinsic factor deficiency: Secondary | ICD-10-CM | POA: Diagnosis not present

## 2017-01-09 DIAGNOSIS — F329 Major depressive disorder, single episode, unspecified: Secondary | ICD-10-CM | POA: Diagnosis not present

## 2017-01-09 DIAGNOSIS — K219 Gastro-esophageal reflux disease without esophagitis: Secondary | ICD-10-CM | POA: Diagnosis not present

## 2017-01-09 DIAGNOSIS — Z79899 Other long term (current) drug therapy: Secondary | ICD-10-CM | POA: Diagnosis not present

## 2017-01-09 DIAGNOSIS — I739 Peripheral vascular disease, unspecified: Secondary | ICD-10-CM | POA: Diagnosis not present

## 2017-01-09 DIAGNOSIS — N182 Chronic kidney disease, stage 2 (mild): Secondary | ICD-10-CM | POA: Diagnosis not present

## 2017-01-09 DIAGNOSIS — Z79891 Long term (current) use of opiate analgesic: Secondary | ICD-10-CM | POA: Diagnosis not present

## 2017-01-09 DIAGNOSIS — G2582 Stiff-man syndrome: Secondary | ICD-10-CM | POA: Diagnosis not present

## 2017-01-09 DIAGNOSIS — M47816 Spondylosis without myelopathy or radiculopathy, lumbar region: Secondary | ICD-10-CM | POA: Diagnosis not present

## 2017-01-09 DIAGNOSIS — F410 Panic disorder [episodic paroxysmal anxiety] without agoraphobia: Secondary | ICD-10-CM | POA: Diagnosis not present

## 2017-01-09 DIAGNOSIS — Z86718 Personal history of other venous thrombosis and embolism: Secondary | ICD-10-CM | POA: Diagnosis not present

## 2017-01-09 DIAGNOSIS — M1991 Primary osteoarthritis, unspecified site: Secondary | ICD-10-CM | POA: Diagnosis not present

## 2017-01-09 DIAGNOSIS — R1310 Dysphagia, unspecified: Secondary | ICD-10-CM | POA: Diagnosis not present

## 2017-01-09 DIAGNOSIS — Z9181 History of falling: Secondary | ICD-10-CM | POA: Diagnosis not present

## 2017-01-09 MED ORDER — DIAZEPAM 10 MG PO TABS: 20.0000 mg | ORAL_TABLET | Freq: Three times a day (TID) | ORAL | 5 refills | Status: DC

## 2017-01-09 NOTE — Telephone Encounter (Signed)
Valium rx signed and faxed to local pharmacy.
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
## 2017-01-09 NOTE — Patient Outreach (Signed)
[facility] CM Telephone Outreach, Transition of Care day 5 01/09/2017  Sherri SlimmerSamantha [name] 1966/06/12 [id number]  Successful telephone outreach to Sherri [name], 51 y/o female [facility] CM for transition of care after multiple recent ED visit and hospital admission January 8-11, 2018 for stiff person syndrome with dizziness and back spasms.  Patient was discharged home with home health [facility] services for PT/OT [facility]  [name] has history including, but not limited to asthma, pernicious anemia, chronic back pain with DJD, ARF, DVT, depression, polysubstance abuse.  HIPAA/ identity verified with patient during phone call today.  Today, Sherri [name] reports she continues "doing much better," and denies needs, concerns, issues, or problems.    -- states has all of her medications and is taking as prescribed; denies questions about her medications, states that she stays in close contact with Dr. Anne HahnWillis, neurologist, regarding her medications.    -- reports that she has heard from [facility]) and states that "the PT came out to visit today." Patient confirmed that she has [facility] for the upcoming week.  -- Reports has a follow up appointment with Dr. Anne HahnWillis, her "main" doctor scheduled for next week, and states that she successfully re-scheduled the time of the visit, and verbalized plans to attend appointment by driving herself there.  -- High fall risk:  Patient reports several falls this year from "pain spasms in back," and "out of body-type experiences."  Denies serious injury with previous falls.  No new falls reported today.  Reports uses cane and/or walker "as needed."  -- continues to deny community resource needs today, stating that she drives herself to appointments "most of the time," and has a supportive/ involved family that assist with her care as  needed.  [facility] Community CM services again discussed with patient today, including how [facility] CM differs [facility] services.  We scheduled [facility] Community CM initial home visit for next week.  I confirmed that patient has my direct phone number, the phone number for main [facility] office and the [facility] CM 24-hour nurse advice line.  Plan:  [name] take her medications as they are prescribed and will attend all provider appointments.  [name] will contact her providers for any concerns, needs, issues, or problems that arise.  [name] will actively participate in [facility] services for PT/ OT, as ordered post-hospital discharge.  [facility] Community CM outreach for transition of care to continue with initial home visit scheduled for next week.  Sherri PinaLaine Mckinney Tousey, RN, BSN, [employer] Corporation[employer]CR[employer] Community Care Coordinator [facility] Care Management  [phone]

## 2017-01-10 ENCOUNTER — Ambulatory Visit: Payer: Self-pay | Admitting: Neurology

## 2017-01-11 ENCOUNTER — Ambulatory Visit: Payer: Self-pay | Admitting: Neurology

## 2017-01-11 DIAGNOSIS — G894 Chronic pain syndrome: Secondary | ICD-10-CM | POA: Diagnosis not present

## 2017-01-11 DIAGNOSIS — G2582 Stiff-man syndrome: Secondary | ICD-10-CM | POA: Diagnosis not present

## 2017-01-11 DIAGNOSIS — M5126 Other intervertebral disc displacement, lumbar region: Secondary | ICD-10-CM | POA: Diagnosis not present

## 2017-01-11 DIAGNOSIS — F329 Major depressive disorder, single episode, unspecified: Secondary | ICD-10-CM | POA: Diagnosis not present

## 2017-01-11 DIAGNOSIS — F410 Panic disorder [episodic paroxysmal anxiety] without agoraphobia: Secondary | ICD-10-CM | POA: Diagnosis not present

## 2017-01-11 DIAGNOSIS — M1991 Primary osteoarthritis, unspecified site: Secondary | ICD-10-CM | POA: Diagnosis not present

## 2017-01-12 DIAGNOSIS — F419 Anxiety disorder, unspecified: Secondary | ICD-10-CM | POA: Diagnosis not present

## 2017-01-12 DIAGNOSIS — G2582 Stiff-man syndrome: Secondary | ICD-10-CM | POA: Diagnosis not present

## 2017-01-12 DIAGNOSIS — E559 Vitamin D deficiency, unspecified: Secondary | ICD-10-CM | POA: Diagnosis not present

## 2017-01-12 DIAGNOSIS — Z0000 Encounter for general adult medical examination without abnormal findings: Secondary | ICD-10-CM | POA: Diagnosis not present

## 2017-01-12 DIAGNOSIS — J45909 Unspecified asthma, uncomplicated: Secondary | ICD-10-CM | POA: Diagnosis not present

## 2017-01-12 DIAGNOSIS — Z113 Encounter for screening for infections with a predominantly sexual mode of transmission: Secondary | ICD-10-CM | POA: Diagnosis not present

## 2017-01-12 DIAGNOSIS — Z1239 Encounter for other screening for malignant neoplasm of breast: Secondary | ICD-10-CM | POA: Diagnosis not present

## 2017-01-15 ENCOUNTER — Ambulatory Visit (HOSPITAL_BASED_OUTPATIENT_CLINIC_OR_DEPARTMENT_OTHER): Payer: Medicare Other

## 2017-01-15 VITALS — BP 137/62 | HR 82 | Temp 98.3°F | Resp 17

## 2017-01-15 DIAGNOSIS — D509 Iron deficiency anemia, unspecified: Secondary | ICD-10-CM

## 2017-01-15 MED ORDER — SODIUM CHLORIDE 0.9 % IV SOLN
510.0000 mg | Freq: Once | INTRAVENOUS | Status: AC
  Administered 2017-01-15: 510 mg via INTRAVENOUS
  Filled 2017-01-15: qty 17

## 2017-01-15 MED ORDER — SODIUM CHLORIDE 0.9 % IV SOLN
Freq: Once | INTRAVENOUS | Status: AC
  Administered 2017-01-15: 13:00:00 via INTRAVENOUS

## 2017-01-15 NOTE — Patient Instructions (Signed)
Ferumoxytol injection What is this medicine? FERUMOXYTOL is an iron complex. Iron is used to make healthy red blood cells, which carry oxygen and nutrients throughout the body. This medicine is used to treat iron deficiency anemia in people with chronic kidney disease. COMMON BRAND NAME(S): Feraheme What should I tell my health care provider before I take this medicine? They need to know if you have any of these conditions: -anemia not caused by low iron levels -high levels of iron in the blood -magnetic resonance imaging (MRI) test scheduled -an unusual or allergic reaction to iron, other medicines, foods, dyes, or preservatives -pregnant or trying to get pregnant -breast-feeding How should I use this medicine? This medicine is for injection into a vein. It is given by a health care professional in a hospital or clinic setting. Talk to your pediatrician regarding the use of this medicine in children. Special care may be needed. What if I miss a dose? It is important not to miss your dose. Call your doctor or health care professional if you are unable to keep an appointment. What may interact with this medicine? This medicine may interact with the following medications: -other iron products What should I watch for while using this medicine? Visit your doctor or healthcare professional regularly. Tell your doctor or healthcare professional if your symptoms do not start to get better or if they get worse. You may need blood work done while you are taking this medicine. You may need to follow a special diet. Talk to your doctor. Foods that contain iron include: whole grains/cereals, dried fruits, beans, or peas, leafy green vegetables, and organ meats (liver, kidney). What side effects may I notice from receiving this medicine? Side effects that you should report to your doctor or health care professional as soon as possible: -allergic reactions like skin rash, itching or hives, swelling of the  face, lips, or tongue -breathing problems -changes in blood pressure -feeling faint or lightheaded, falls -fever or chills -flushing, sweating, or hot feelings -swelling of the ankles or feet Side effects that usually do not require medical attention (report to your doctor or health care professional if they continue or are bothersome): -diarrhea -headache -nausea, vomiting -stomach pain Where should I keep my medicine? This drug is given in a hospital or clinic and will not be stored at home.  2017 Elsevier/Gold Standard (2016-01-13 12:41:49)  

## 2017-01-16 DIAGNOSIS — G894 Chronic pain syndrome: Secondary | ICD-10-CM | POA: Diagnosis not present

## 2017-01-16 DIAGNOSIS — M5126 Other intervertebral disc displacement, lumbar region: Secondary | ICD-10-CM | POA: Diagnosis not present

## 2017-01-16 DIAGNOSIS — J342 Deviated nasal septum: Secondary | ICD-10-CM | POA: Diagnosis not present

## 2017-01-16 DIAGNOSIS — F410 Panic disorder [episodic paroxysmal anxiety] without agoraphobia: Secondary | ICD-10-CM | POA: Diagnosis not present

## 2017-01-16 DIAGNOSIS — J322 Chronic ethmoidal sinusitis: Secondary | ICD-10-CM | POA: Diagnosis not present

## 2017-01-16 DIAGNOSIS — F329 Major depressive disorder, single episode, unspecified: Secondary | ICD-10-CM | POA: Diagnosis not present

## 2017-01-16 DIAGNOSIS — J3501 Chronic tonsillitis: Secondary | ICD-10-CM | POA: Diagnosis not present

## 2017-01-16 DIAGNOSIS — H6121 Impacted cerumen, right ear: Secondary | ICD-10-CM | POA: Diagnosis not present

## 2017-01-16 DIAGNOSIS — M1991 Primary osteoarthritis, unspecified site: Secondary | ICD-10-CM | POA: Diagnosis not present

## 2017-01-16 DIAGNOSIS — J358 Other chronic diseases of tonsils and adenoids: Secondary | ICD-10-CM | POA: Diagnosis not present

## 2017-01-16 DIAGNOSIS — J320 Chronic maxillary sinusitis: Secondary | ICD-10-CM | POA: Diagnosis not present

## 2017-01-16 DIAGNOSIS — G2582 Stiff-man syndrome: Secondary | ICD-10-CM | POA: Diagnosis not present

## 2017-01-16 DIAGNOSIS — J040 Acute laryngitis: Secondary | ICD-10-CM | POA: Diagnosis not present

## 2017-01-17 ENCOUNTER — Other Ambulatory Visit: Payer: Self-pay | Admitting: *Deleted

## 2017-01-17 ENCOUNTER — Encounter: Payer: Self-pay | Admitting: *Deleted

## 2017-01-17 DIAGNOSIS — M1991 Primary osteoarthritis, unspecified site: Secondary | ICD-10-CM | POA: Diagnosis not present

## 2017-01-17 DIAGNOSIS — G2582 Stiff-man syndrome: Secondary | ICD-10-CM | POA: Diagnosis not present

## 2017-01-17 DIAGNOSIS — F410 Panic disorder [episodic paroxysmal anxiety] without agoraphobia: Secondary | ICD-10-CM | POA: Diagnosis not present

## 2017-01-17 DIAGNOSIS — F329 Major depressive disorder, single episode, unspecified: Secondary | ICD-10-CM | POA: Diagnosis not present

## 2017-01-17 DIAGNOSIS — M5126 Other intervertebral disc displacement, lumbar region: Secondary | ICD-10-CM | POA: Diagnosis not present

## 2017-01-17 DIAGNOSIS — G894 Chronic pain syndrome: Secondary | ICD-10-CM | POA: Diagnosis not present

## 2017-01-17 NOTE — Patient Outreach (Addendum)
[facility] Customer service manager [facility]) Care Management  [facility] Community CM Initial Home Visit, Transition of Care day 13 01/17/2017  Sherri [name] 12-Jul-1966 [id number]  Sherri [name] is an 51 y.o. 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 Patient was discharged home with home health [facility]) services for PT/OT through [facility]). Sherri [name] has history including, but not limited to asthma, pernicious anemia, chronic back pain with DJD, ARF, DVT, depression, polysubstance abuse. HIPAA/ identity verified with patient during home visit today.  Updated THN CM informed consent with patient signature obtained today.  Today, Sherri [name] reports she continues "doing much better," and denies needs, concerns, issues, or problems.   -- states has all of medications and is taking as prescribed; denies questions about medications, verbalizes a good general understanding of purpose, dosing, and scheduling of prescribed medications.  Patient was recently discharged from hospital and all medications were thoroughly reviewed with patient today.  -- reports that she has heard from [facility] agency and states that PT has started home visits, which "is going well."    -- provider appointments:  Reviewed all upcoming provider appointments with patient, who verbalizes plans to attend all; patient states she will driver herself to provider appointments.  Noted that patient has scheduled follow up appointment with Dr, Anne Hahn, neuro, tomorrow, which patient is aware of.  -- High fall risk: Patient again reports several falls this year from "pain spasms in back," and "out of body-type experiences." Denies serious injury with previous falls. No new falls reported today.  Reports uses cane and/or walker "as needed."  Today, patient's gait is steady and  purposeful without the use of assistive devices.  Patient has recently moved into a new apartment and has not yet completed her unpacking/ household arranging.  Several fall hazards noted around patient's physical environment, which patient verbalizes she is aware of.  Fall risk/ prevention assessment and education was provided today during initial home visit.  -- continues to deny community resource needs today, stating that she drives herself to appointments "most of the time," and has a supportive/ involved family that assist with her care as needed.  Patient stated that she is actively trying to figure out how to get Medicaid, and is unsure of the process, although she has started looking into this.  Patient was encouraged to continue in her efforts, and to make [facility] RN CM aware if she needs additional assistance with this process.  Patient also stated today that she has been previously active with [employer] and "several churches" for support with food provision assistance, and states that she does not need additional services at the present time.  -- Advanced Directive (AD) Planning:  Patient reports that she does not currently have exisisting AD in place.  Educational material was provided today for patient's review and consideration and was thoroughly discussed with patient.  [facility] Community CM services again discussed with patient today, and [name] denies further issues, concerns, or problems.  I confirmed that patient has my direct phone number, the main [facility] CM office phone number, and the [facility] CM 24-hour nurse advice phone number should issues arise prior to next scheduled [facility] Community CM outreach.  Subjective: "I am feeling much better about everything.  I think I am doing well."  Objective:    BP 110/62   Pulse  68   Resp 16   LMP 07/29/2012   SpO2 98%    Review of Systems  Constitutional: Negative.   Respiratory: Negative.  Negative for cough, shortness of breath  and wheezing.   Cardiovascular: Negative.  Negative for leg swelling.  Gastrointestinal: Negative.  Negative for abdominal pain and nausea.       Patient reports occasional diarrhea; takes OTC meds as needed  Genitourinary: Negative.   Musculoskeletal: Positive for back pain and falls.       History of falls; no new falls reported today  Skin: Negative.   Neurological: Negative.   Psychiatric/Behavioral: Negative.  Negative for depression. The patient is not nervous/anxious.        History of substance abuse    Physical Exam  Constitutional: She is oriented to person, place, and time. She appears well-developed and well-nourished. No distress.  Cardiovascular: Normal rate, regular rhythm, normal heart sounds and intact distal pulses.   Pulses:      Radial pulses are 2+ on the right side, and 2+ on the left side.       Dorsalis pedis pulses are 2+ on the right side, and 2+ on the left side.  Respiratory: Effort normal and breath sounds normal. No respiratory distress. She has no wheezes. She has no rales.  GI: Soft. Bowel sounds are normal.  Musculoskeletal: She exhibits no edema.  Neurological: She is alert and oriented to person, place, and time.  Skin: Skin is warm and dry.  Psychiatric: She has a normal mood and affect. Her behavior is normal. Judgment and thought content normal.   Assessment:  Sherri [name] is recuperating well from her recent hospitalization and seems to be feeling much better.  Sherri [name] is a high fall risk, but is aware of her risks and verbalizes commitment to making her environment free of fall risks, working with [facility] PT services, and to using assistive devices as needed.  Sherri [name] has a good understanding of her overall health plan of care and is committed to compliance with her medications and attending provider appointments.  Ms. Hunzeker is interested in applying for Medicaid and also in learning more about Advance directive planning.  [facility] CM Care Plan Problem  One   Flowsheet Row Most Recent Value  Care Plan Problem One  Risk for hospital re-admission secondary to multiple recent ED visits and recent hospital admission  Role Documenting the Problem One  Care Management [place] for Problem One  Active  THN Long Term Goal (31-90 days)  Patient will not re-admit to hospital within the next 31 days, as evidenced by patient reporting and review of EMR  THN Long Term Goal Start Date  01/05/17  Interventions for Problem One Long Term Goal  Utilizing teachback method, reiterated with patient the importance of taking medications as prescribed, attending all provider appointments as scheduled, working with [facility] services, and notifying providers promptly for any issues, concerns, or problems that arise.  Provided positive reinforcement for steps patient has taken thus far to self-manage her state of health post- hospital discharge  THN CM Short Term Goal #1 (0-30 days)  Over the next 30 days, patient will meet with [place] RN CM to discuss self-health management of chronic pain syndrome, as evidenced by successful completion of initial [place] initial home visit  [facility] CM Short Term Goal #1 Start Date  01/05/17  [facility] CM Short Term Goal #1 Met Date  01/17/17  Interventions for Short Term  Goal #1  Utilizing teachback method, discussed [facility] Community CM services, TOC program, and explained difference between [facility] CM and HH services,  scheduled initial [place] RN CM home visit for next week  THN CM Short Term Goal #2 (0-30 days)  Over the next 30 days, patient will actively participate with [facility] services for PT/OT, as evidenced by patient reporting during [place] outreach  [facility] CM Short Term Goal #2 Start Date  01/05/17  Interventions for Short Term Goal #2  Utilizing teachback method, reiterated with patient value of [place] services for PT/ OT, provided positive reinforcement for actively participating in [facility] services thus far    [facility] CM  Care Plan Problem Two   Flowsheet Row Most Recent Value  Care Plan Problem Two  Knowledge deficit regarding Advanced Directive planning  Role Documenting the Problem Two  Care Management [place] for Problem Two  Active  THN CM Short Term Goal #1 (0-30 days)  Over the next 30 days, patient will review [place] provided to her, as evidenced by patient reporting during [place] outreach  [facility] CM Short Term Goal #1 Start Date  01/17/17  Interventions for Short Term Goal #2   Utilizing teachback method, thoroughly discussed AD planning with patient, and provided her with AD educational packet    [facility] CM Care Plan Problem Three   Flowsheet Row Most Recent Value  Care Plan Problem Three  High Fall Risk related to history of multiple falls  Role Documenting the Problem Three  Care Management [place] for Problem Three  Active  THN Long Term Goal (31-90) days  Over the next 48 days, patient will not experience any new falls, as evidenced by patient reporting during [place] CM outreach  [facility] Long Term Goal Start Date  01/17/17  Interventions for Problem Three Long Term Goal  Utilizing teachback method, discussed with patient fall risks/ prevention,  fall assessment completed today,  encouraged patient to use assistive devices as needed when having back pain, and encouraged her to continue actively working with [facility] PT      Plan:   Ms. Zbikowski take hermedications as they are prescribed and will attend all provider appointments.  Ms. Wachob contact herproviders for any concerns, needs, issues, or problems that arise.  Ms. Porto will actively participate in [facility] services for PT/ OT, as ordered post-hospital discharge.  Ms. Santino will review Advanced Directive Planning educational materials provided to her today.  [place] outreach for transition of care to continue with scheduled telephone outreach next week.  I  appreciate the opportunity to participate in Ms. Wordell's care,  Oneta Rack, RN, BSN, [place] Coordinator [facility] Care Management  [phone]

## 2017-01-18 ENCOUNTER — Ambulatory Visit (INDEPENDENT_AMBULATORY_CARE_PROVIDER_SITE_OTHER): Payer: Medicare Other | Admitting: Neurology

## 2017-01-18 ENCOUNTER — Encounter: Payer: Self-pay | Admitting: Neurology

## 2017-01-18 VITALS — BP 113/69 | HR 84 | Ht 63.0 in | Wt 151.0 lb

## 2017-01-18 DIAGNOSIS — G894 Chronic pain syndrome: Secondary | ICD-10-CM

## 2017-01-18 DIAGNOSIS — G2582 Stiff-man syndrome: Secondary | ICD-10-CM | POA: Diagnosis not present

## 2017-01-18 NOTE — Progress Notes (Signed)
Reason for visit: Stiff person syndrome  Sherri [name] is an 51 y.o. female  History of present illness:  Sherri [name] is a 51 year old right-handed black female with a history of stiff person syndrome. The patient went into the hospital around 01/01/2017 with severe muscle spasms involving the back and difficulty with walking. The patient was treated for several days, the spasms have improved, she still have some stiffness of the left hip and thigh area. The patient is using a walker for ambulation outside the house, she denies any falls. The patient underwent MRI evaluation of the brain in the hospital as she had bumped her head, this was unremarkable. The patient is now on baclofen, diazepam, and gabapentin. She has a history of cocaine abuse, but her most recent urine drug screen during the January hospitalization was negative for cocaine.  Past Medical History:  Diagnosis Date  . Angioedema   . Anxiety   . Arthritis   . Asthma   . CAP (community acquired pneumonia) 08/29/2012  . Chronic back pain   . Chronic kidney disease   . Cocaine abuse   . Depression   . Diarrhea   . DVT (deep venous thrombosis) (HCC)   . Environmental allergies   . Fall   . GERD (gastroesophageal reflux disease)   . Heart murmur    has been told once that she has a heart murmur, but has never had any problems  . Hiatal hernia 03/21/2013  . Lumbar herniated disc   . Peripheral vascular disease (HCC)   . Pernicious anemia 10/30/2011  . Postconcussion syndrome 12/30/2014  . Seasonal allergies   . Stiff person syndrome   . Urticaria     Past Surgical History:  Procedure Laterality Date  . BUNIONECTOMY Left   . colonoscopy  07/09/15  . inguinal hernia 1983  1983  . OPEN REDUCTION INTERNAL FIXATION (ORIF) DISTAL RADIAL FRACTURE Right 08/16/2016   Procedure: OPEN REDUCTION INTERNAL FIXATION (ORIF) DISTAL RADIAL FRACTURE;  Surgeon: Eldred Manges, MD;  Location: [facility] OR;  Service: Orthopedics;  Laterality:  Right;    Family History  Problem Relation Age of Onset  . Cancer Mother   . Other Mother   . COPD Father   . Asthma Brother   . Cancer Brother     colon  . Heart attack Brother   . Seizures Brother     Social history:  reports that she has never smoked. She has never used smokeless tobacco. She reports that she drinks alcohol. She reports that she uses drugs, including Cocaine.    Allergies  Allergen Reactions  . Ibuprofen Other (See Comments)    Does not take due to hx of renal insufficiency  . Tylenol [Acetaminophen] Hives    Cannot take large quantities  . Lemon Flavor Swelling    Severe Lip Swelling   . Amoxicillin Diarrhea    Medications:  Prior to Admission medications   Medication Sig Start Date End Date Taking? Authorizing Provider  albuterol (PROVENTIL HFA;VENTOLIN HFA) 108 (90 BASE) MCG/ACT inhaler Inhale 1 puff into the lungs every 6 (six) hours as needed. PRO AIR 06/20/13  Yes Waymon Budge, MD  azithromycin (ZITHROMAX) 500 MG tablet Take 500 mg by mouth daily. 01/16/17  Yes Historical Provider, MD  baclofen (LIORESAL) 20 MG tablet 1.5 tablets in the morning and evening, one tablet at midday 08/22/16  Yes York Spaniel, MD  Cholecalciferol (VITAMIN D3) 5000 units CAPS Take by mouth.   Yes Historical  Provider, MD  cyanocobalamin (,VITAMIN B-12,) 1000 MCG/ML injection Inject 1,000 mcg into the muscle every 3 (three) months.    Yes Historical Provider, MD  desloratadine (CLARINEX) 5 MG tablet Take 1 tablet (5 mg total) by mouth daily. 11/06/11  Yes Waymon Budgelinton D Young, MD  diazepam (VALIUM) 10 MG tablet Take 2 tablets (20 mg total) by mouth 3 (three) times daily. 01/09/17  Yes York Spanielharles K Willis, MD  diphenhydrAMINE (BENADRYL) 25 MG tablet Take 25 mg by mouth at bedtime as needed for itching or allergies.    Yes Historical Provider, MD  EPIPEN 2-PAK 0.3 MG/0.3ML SOAJ injection USE AS DIRECTED FOR  SEVERE  ALLERGIC  REACTIONS 08/16/15  Yes Waymon Budgelinton D Young, MD  FLUoxetine  (PROZAC) 10 MG capsule Take 10 mg by mouth daily.   Yes Historical Provider, MD  Fluticasone-Salmeterol (ADVAIR) 250-50 MCG/DOSE AEPB Inhale 1 puff into the lungs 2 (two) times daily.  03/14/12  Yes Waymon Budgelinton D Young, MD  gabapentin (NEURONTIN) 300 MG capsule 2 capsules in the morning and evening, one capsule at midday 08/22/16  Yes York Spanielharles K Willis, MD  levocetirizine (XYZAL) 5 MG tablet Take 1 tablet (5 mg total) by mouth every evening. 11/06/11  Yes Waymon Budgelinton D Young, MD  loperamide (IMODIUM A-D) 2 MG tablet Take 2 mg by mouth 2 (two) times daily as needed for diarrhea or loose stools.   Yes Historical Provider, MD  loratadine (CLARITIN) 10 MG tablet Take 10 mg by mouth daily.    Yes Historical Provider, MD  Menthol, Topical Analgesic, 3.7 % GEL Apply topically as needed.   Yes Historical Provider, MD  montelukast (SINGULAIR) 10 MG tablet Take 10 mg by mouth at bedtime.   Yes Historical Provider, MD  Morphine-Naltrexone (EMBEDA) 20-0.8 MG CPCR Take by mouth.   Yes Historical Provider, MD  OVER THE COUNTER MEDICATION I B Guard   Yes Historical Provider, MD    ROS:  Out of a complete 14 system review of symptoms, the patient complains only of the following symptoms, and all other reviewed systems are negative.  Bruising easily, anemia Depression Insomnia  Blood pressure 113/69, pulse 84, height 5\' 3"  (1.6 m), weight 151 lb (68.5 kg), last menstrual period 07/29/2012.  Physical Exam  General: The patient is alert and cooperative at the time of the examination.  Neuromuscular: The patient lacks only about 10 of full flexion of the low back.  Skin: No significant peripheral edema is noted.   Neurologic Exam  Mental status: The patient is alert and oriented x 3 at the time of the examination. The patient has apparent normal recent and remote memory, with an apparently normal attention span and concentration ability.   Cranial nerves: Facial symmetry is present. Speech is normal, no  aphasia or dysarthria is noted. Extraocular movements are full. Visual fields are full.  Motor: The patient has good strength in all 4 extremities.  Sensory examination: Soft touch sensation is symmetric on the face, arms, and legs.  Coordination: The patient has good finger-nose-finger and heel-to-shin bilaterally.  Gait and station: The patient has a normal gait. Tandem gait is normal. Romberg is negative. No drift is seen.  Reflexes: Deep tendon reflexes are symmetric.   MRI brain 01/02/17:  IMPRESSION: Stable and negative brain MRI.  * MRI scan images were reviewed online. I agree with the written report.    Assessment/Plan:  1. Stiff person syndrome  2. Recent hospitalization for muscle spasms, back pain  The patient is doing better following  the hospitalization, we will maintain her medications at this time, she will follow-up in about 5 months, sooner if needed. The patient has been set up for physical therapy and occupational therapy in the home environment following the hospitalization.  Marlan Palau MD 01/18/2017 1:07 PM  [facility] [address] [place], [place] [address]  Phone [phone] Fax [phone]

## 2017-01-19 ENCOUNTER — Other Ambulatory Visit: Payer: Self-pay | Admitting: *Deleted

## 2017-01-19 ENCOUNTER — Encounter: Payer: Self-pay | Admitting: *Deleted

## 2017-01-19 ENCOUNTER — Ambulatory Visit: Payer: Self-pay | Admitting: Neurology

## 2017-01-19 DIAGNOSIS — F329 Major depressive disorder, single episode, unspecified: Secondary | ICD-10-CM | POA: Diagnosis not present

## 2017-01-19 DIAGNOSIS — G894 Chronic pain syndrome: Secondary | ICD-10-CM | POA: Diagnosis not present

## 2017-01-19 DIAGNOSIS — M5126 Other intervertebral disc displacement, lumbar region: Secondary | ICD-10-CM | POA: Diagnosis not present

## 2017-01-19 DIAGNOSIS — M1991 Primary osteoarthritis, unspecified site: Secondary | ICD-10-CM | POA: Diagnosis not present

## 2017-01-19 DIAGNOSIS — G2582 Stiff-man syndrome: Secondary | ICD-10-CM | POA: Diagnosis not present

## 2017-01-19 DIAGNOSIS — F410 Panic disorder [episodic paroxysmal anxiety] without agoraphobia: Secondary | ICD-10-CM | POA: Diagnosis not present

## 2017-01-19 NOTE — Patient Outreach (Signed)
[facility]) Care Management [facility] Community CM Telephone Outreach, Care Coordination 01/19/2017  Sherri SlimmerSamantha [name] [birth year] [id number]  Successful telephone outreach to [name] Joyce, [facility] CSW [phone]) regarding Sherri SlimmerSamantha Billet is an 51 y.o. 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 PatienCustomer service managert was discharged home with home health [facility](HH) services for PT/OT through [facility]). [name] has history including, but not limited to asthma, pernicious anemia, chronic back pain with DJD, ARF, DVT, depression, polysubstance abuse.   Call was [facility] to [facility] today to inquire about process for patient obtaining Medicaid, as patient reported [facility] CM initial home visit earlier this week that she would like to have information about applying for Medicaid.  Brooke advised that if able, patient should physically go in person to Division of Social Services offices to obtain/ complete application for [employer], as this is the most efficient means of completing application process.  Nehemiah SettleBrooke shared that if patient has difficulty with process, [facility] CSW referral should be made.  Plan:  Will communicate information obtained from [facility] CS[facility] today to patient during next [facility] Community CM outreach and will make [facility] CS[facility] referral if indicated after speaking to patient.   Caryl PinaLaine Mckinney Tousey, RN, BSN, [employer] Community Care Coordinator [facility] Care Management  [phone]

## 2017-01-23 DIAGNOSIS — M1991 Primary osteoarthritis, unspecified site: Secondary | ICD-10-CM | POA: Diagnosis not present

## 2017-01-23 DIAGNOSIS — G2582 Stiff-man syndrome: Secondary | ICD-10-CM | POA: Diagnosis not present

## 2017-01-23 DIAGNOSIS — F410 Panic disorder [episodic paroxysmal anxiety] without agoraphobia: Secondary | ICD-10-CM | POA: Diagnosis not present

## 2017-01-23 DIAGNOSIS — G894 Chronic pain syndrome: Secondary | ICD-10-CM | POA: Diagnosis not present

## 2017-01-23 DIAGNOSIS — F329 Major depressive disorder, single episode, unspecified: Secondary | ICD-10-CM | POA: Diagnosis not present

## 2017-01-23 DIAGNOSIS — M5126 Other intervertebral disc displacement, lumbar region: Secondary | ICD-10-CM | POA: Diagnosis not present

## 2017-01-24 ENCOUNTER — Other Ambulatory Visit: Payer: Self-pay | Admitting: *Deleted

## 2017-01-24 ENCOUNTER — Encounter: Payer: Self-pay | Admitting: *Deleted

## 2017-01-24 NOTE — Patient Outreach (Signed)
[facility]) Care Management [facility] Community CM Telephone Outreach, Transition of Care day 20  01/24/2017  Sherri SlimmerSamantha [name] 1966-07-23 [id number]  Unsuccessful telephone outreach to Sherri SlimmerSamantha Sparling, 51 y.o. 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 Patient was discharged home with home health [facility](HH) services for PT/OT through [facility]). [name] has history including, but not limited to asthma, pernicious anemia, chronic back pain with DJD, ARF, DVT, depression, polysubstance abuse.   HIPAA compliant voice mail message left for patient, requesting return call back.  Plan:  Will re-attempt [facility] Community CM telephone outreach later this week if I do not hear back from patient first.   Caryl PinaLaine Mckinney Tousey, RN, BSN, CCRN Alumnus [facility] Coordinator [facility] Care Management  [phone]

## 2017-01-24 NOTE — Patient Outreach (Signed)
[facility] Customer service managerHealthCare Network [facility](THN) Care Management [facility] Community CM Telephone Outreach, Transition of Care day 20  01/24/2017  Sherri SlimmerSamantha [name] 05-12-1966 [id number]  Successful incoming telephone outreach from [name], 51 y.o. 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 Patient was discharged home with home health [facility](HH) services for PT/OT through [facility] [name] has history including, but not limited to asthma, pernicious anemia, chronic back pain with DJD, ARF, DVT, depression, polysubstance abuse. HIPAA/ identity verified with patient during phone call today.    Today, Sherri [name] reports she "is doing great," and denies needs, concerns, issues, or problems.   -- states has all of medications and is taking as prescribed; denies questions about medications, and again verbalizes a good general understanding of purpose, dosing, and scheduling of prescribed medications.    -- reports that she has continued working with [facility] and states that PT and OT have started home visits.  Reports that [facility] that she believes she will only have one more visit form each discipline; states that she feels ready for discharge form HH services.   -- provider appointments:  Patient attended follow up appointment with Dr, Anne HahnWillis, neuro provider, whom patient considers her PCP on January 18, 2017; states appointment "went great, got a good report."  Patient reports that no changes in overall medical plan of care as a result of follow up appointment, and that she does not go back to see Dr. Anne HahnWillis until "5 or 6 months," unless she has problems arise before then.   -- High fall risk: No new falls reported today; continues to use cane and/or walker "as needed."  Patient states that she "feels  steady" when walking.  -- continues to Charter Communicationsdenycommunity resource needs today, discussed process for patient to take to obtain Medicaid, as a result of care coordination call from [facility] CSW, patient verbalizes understanding.  I confirmed that patient hasmy direct phone number, the main [facility] CM office phone number, and the [facility] CM 24-hour nurse advice phone number should issues arise prior to next scheduled [facility] Community CM outreach.  Plan:  [name] take hermedications as they are prescribed and will attend all provider appointments.  [name] contact herproviders for any concerns, needs, issues, or problems that arise.  [name] will actively participate [facility]/ OT, as ordered post-hospital discharge.  [name] will review Advanced Directive Planning educational materials provided to her during [facility] RN CM initial home visit.  THN Community CM outreach for transition of care to continue with scheduled telephone outreach next week.  Caryl PinaLaine Mckinney Delora Gravatt, RN, BSN, [employer] CorporationCCRN Alumnus Community Care Coordinator [facility] Care Management  [phone]

## 2017-01-25 DIAGNOSIS — J320 Chronic maxillary sinusitis: Secondary | ICD-10-CM | POA: Diagnosis not present

## 2017-01-25 DIAGNOSIS — J3501 Chronic tonsillitis: Secondary | ICD-10-CM | POA: Diagnosis not present

## 2017-01-25 DIAGNOSIS — J322 Chronic ethmoidal sinusitis: Secondary | ICD-10-CM | POA: Diagnosis not present

## 2017-01-25 DIAGNOSIS — J040 Acute laryngitis: Secondary | ICD-10-CM | POA: Diagnosis not present

## 2017-01-26 DIAGNOSIS — G2582 Stiff-man syndrome: Secondary | ICD-10-CM | POA: Diagnosis not present

## 2017-01-26 DIAGNOSIS — F329 Major depressive disorder, single episode, unspecified: Secondary | ICD-10-CM | POA: Diagnosis not present

## 2017-01-26 DIAGNOSIS — G894 Chronic pain syndrome: Secondary | ICD-10-CM | POA: Diagnosis not present

## 2017-01-26 DIAGNOSIS — M5126 Other intervertebral disc displacement, lumbar region: Secondary | ICD-10-CM | POA: Diagnosis not present

## 2017-01-26 DIAGNOSIS — F410 Panic disorder [episodic paroxysmal anxiety] without agoraphobia: Secondary | ICD-10-CM | POA: Diagnosis not present

## 2017-01-26 DIAGNOSIS — M1991 Primary osteoarthritis, unspecified site: Secondary | ICD-10-CM | POA: Diagnosis not present

## 2017-01-30 ENCOUNTER — Encounter: Payer: Self-pay | Admitting: *Deleted

## 2017-01-30 ENCOUNTER — Other Ambulatory Visit: Payer: Self-pay | Admitting: *Deleted

## 2017-01-30 NOTE — Patient Outreach (Signed)
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 Patient was discharged home with home health [facility](HH) services for PT/OT through [facility] Sherri [name] has history including, but not limited to asthma, pernicious anemia, chronic back pain with DJD, ARF, DVT, depression, polysubstance abuse. HIPAA/ identity verified with patient during phone calltoday.   Today, Sherri [name] reports she "is having some back pain and soreness," stating that she believes she "slept on it wrong."  Patient states that she was awoken yesterday when the soreness started.  States that she is taking pain medication and tylenol as needed; also stretching as much as she can.  Sherri [name] reports the back pain she is having now is "different" from the back pain/ spasms she had with her last hospitalization.  Patient currently denies need to contact PCP, states she thinks it will go away, stating, "it feels like I pulled it, it's like I slept on it wrong." Patient was encouraged to contact her medical providers should this new pain worsen or not get better, and she verbalized understanding and agreement.  -- states has all of medicationsand is taking as prescribed; denies questions about medications.  -- reports that she believes [name]) HHPT and OT services are now completed as she has not heard back from either discipline recently.  Patient states that she would like to contact [facility] to verify, and I provided the [facility] number for [facility] to her.  -- provider appointments: Patient attended follow up appointment with Sherri [name] 01/25/17, states "got  a good report-- he said I was doing great."     -- High fall risk:No new falls reported today; continues to use cane and/or walker "as needed." Patient states that she "feels steady" when walking, stating that she is "moving slow" with her "pulled back muscle."  Patient denies further issues, concerns, or problems today.  I confirmed that patient has my direct phone number, the main [name] CM office phone number, and the [facility] CM 24-hour nurse advice phone number should issues arise prior to next scheduled [facility] Community CM outreach.  Plan:  [name] take hermedications as they are prescribed and will attend all provider appointments.  [name] contact herproviders for any concerns, needs, issues, or problems that arise.  Sherri [name] will review Advanced Directive Planning educational materials provided to her during [facility] RN CM initial home visit.  THN Community CM outreach for transition of care to continue with scheduled telephone outreach next week.  Sherri PinaLaine Mckinney Aily Tzeng, RN, BSN, [employer]CCRN Alumnus Community Care Coordinator [facility] Care Management  [phone]

## 2017-02-05 ENCOUNTER — Encounter: Payer: Self-pay | Admitting: *Deleted

## 2017-02-05 ENCOUNTER — Other Ambulatory Visit: Payer: Self-pay | Admitting: *Deleted
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
## 2017-02-06 DIAGNOSIS — M545 Low back pain: Secondary | ICD-10-CM | POA: Diagnosis not present

## 2017-02-06 DIAGNOSIS — M47816 Spondylosis without myelopathy or radiculopathy, lumbar region: Secondary | ICD-10-CM | POA: Diagnosis not present

## 2017-02-06 DIAGNOSIS — G894 Chronic pain syndrome: Secondary | ICD-10-CM | POA: Diagnosis not present

## 2017-02-06 DIAGNOSIS — Z79899 Other long term (current) drug therapy: Secondary | ICD-10-CM | POA: Diagnosis not present

## 2017-02-06 DIAGNOSIS — Z79891 Long term (current) use of opiate analgesic: Secondary | ICD-10-CM | POA: Diagnosis not present

## 2017-02-12 ENCOUNTER — Ambulatory Visit: Payer: Self-pay | Admitting: *Deleted

## 2017-02-13 ENCOUNTER — Encounter: Payer: Self-pay | Admitting: *Deleted

## 2017-02-13 ENCOUNTER — Other Ambulatory Visit: Payer: Self-pay | Admitting: *Deleted

## 2017-02-13 NOTE — Patient Outreach (Signed)
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 Patient was discharged home with home health [facility](HH) services for PT/OT through [facility] [name] has history including, but not limited to asthma, pernicious anemia, chronic back pain with DJD, ARF, DVT, depression, polysubstance abuse. HIPAA/ identity verified with patient during phone calltoday.   Call today was placed to follow up on patient after she reported a fall last week; today, patient sounded as if she might have been awoken by the phone, and stated, "things are good... So far so good."  Patient stated that she could not talk to me on phone this afternoon, and said she would call me back later.    Patient denies additional issues, concerns, or problems today.   Plan:  [name] take hermedications as they are prescribed and will attend all provider appointments.  [name] contact herproviders for any concerns, needs, issues, or problems that arise.  [name] will review Advanced Directive Planning educational materials provided to her during [facility] RN CM initial home visit.  Will schedule THN Community CM telephone outreach within next 2 weeks if I do not hear back from patient first.   Caryl PinaLaine Mckinney Lakyla Biswas, RN, BSN, CCRN Alumnus [facility] Center[facility] Care Coordinator [facility] Care Management  [phone]

## 2017-02-19 DIAGNOSIS — M545 Low back pain: Secondary | ICD-10-CM | POA: Diagnosis not present

## 2017-02-19 DIAGNOSIS — G894 Chronic pain syndrome: Secondary | ICD-10-CM | POA: Diagnosis not present

## 2017-02-19 DIAGNOSIS — Z79899 Other long term (current) drug therapy: Secondary | ICD-10-CM | POA: Diagnosis not present

## 2017-02-19 DIAGNOSIS — M47816 Spondylosis without myelopathy or radiculopathy, lumbar region: Secondary | ICD-10-CM | POA: Diagnosis not present

## 2017-02-19 DIAGNOSIS — Z79891 Long term (current) use of opiate analgesic: Secondary | ICD-10-CM | POA: Diagnosis not present

## 2017-02-26 ENCOUNTER — Other Ambulatory Visit: Payer: Medicare Other

## 2017-02-26 ENCOUNTER — Ambulatory Visit: Payer: Medicare Other | Admitting: Hematology & Oncology

## 2017-02-26 ENCOUNTER — Ambulatory Visit: Payer: Medicare Other

## 2017-02-28 DIAGNOSIS — T2000XA Burn of unspecified degree of head, face, and neck, unspecified site, initial encounter: Secondary | ICD-10-CM | POA: Diagnosis not present

## 2017-03-05 ENCOUNTER — Other Ambulatory Visit: Payer: Self-pay | Admitting: *Deleted

## 2017-03-05 ENCOUNTER — Encounter: Payer: Self-pay | Admitting: *Deleted
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
## 2017-03-05 NOTE — Patient Outreach (Signed)
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 Patient was discharged home with home health [facility](HH) services for PT/OT through [facility] [name] has history including, but not limited to asthma, pernicious anemia, chronic back pain with DJD, ARF, DVT, depression, polysubstance abuse.   HIPAA compliant voice mail message left for patient, requesting return call back.  Plan:  Will re-attempt THN Community CM telephone outreach later this week if I do not hear back from patient first.  Caryl PinaLaine Mckinney Dezirae Service, RN, BSN, CCRN Alumnus [facility] CareCommunity Care Coordinator [facility] Care Management  [phone]

## 2017-03-06 DIAGNOSIS — T2025XA Burn of second degree of scalp [any part], initial encounter: Secondary | ICD-10-CM | POA: Diagnosis not present

## 2017-03-06 DIAGNOSIS — T20211A Burn of second degree of right ear [any part, except ear drum], initial encounter: Secondary | ICD-10-CM | POA: Diagnosis not present

## 2017-03-06 DIAGNOSIS — T20212A Burn of second degree of left ear [any part, except ear drum], initial encounter: Secondary | ICD-10-CM | POA: Diagnosis not present

## 2017-03-06 DIAGNOSIS — T2026XA Burn of second degree of forehead and cheek, initial encounter: Secondary | ICD-10-CM | POA: Diagnosis not present

## 2017-03-06 DIAGNOSIS — L905 Scar conditions and fibrosis of skin: Secondary | ICD-10-CM | POA: Diagnosis not present

## 2017-03-07 ENCOUNTER — Ambulatory Visit: Payer: Self-pay | Admitting: *Deleted

## 2017-03-13 DIAGNOSIS — Z79899 Other long term (current) drug therapy: Secondary | ICD-10-CM | POA: Diagnosis not present

## 2017-03-13 DIAGNOSIS — M545 Low back pain: Secondary | ICD-10-CM | POA: Diagnosis not present

## 2017-03-13 DIAGNOSIS — M47816 Spondylosis without myelopathy or radiculopathy, lumbar region: Secondary | ICD-10-CM | POA: Diagnosis not present

## 2017-03-13 DIAGNOSIS — G894 Chronic pain syndrome: Secondary | ICD-10-CM | POA: Diagnosis not present

## 2017-03-13 DIAGNOSIS — Z79891 Long term (current) use of opiate analgesic: Secondary | ICD-10-CM | POA: Diagnosis not present

## 2017-03-15 ENCOUNTER — Ambulatory Visit: Payer: Medicare Other

## 2017-03-15 ENCOUNTER — Ambulatory Visit: Payer: Medicare Other | Admitting: Hematology & Oncology

## 2017-03-15 ENCOUNTER — Other Ambulatory Visit: Payer: Medicare Other

## 2017-03-19 ENCOUNTER — Other Ambulatory Visit: Payer: Self-pay | Admitting: Neurology

## 2017-03-21 ENCOUNTER — Ambulatory Visit: Payer: Medicare Other

## 2017-03-21 ENCOUNTER — Ambulatory Visit: Payer: Medicare Other | Admitting: Family

## 2017-03-21 ENCOUNTER — Other Ambulatory Visit: Payer: Medicare Other

## 2017-03-21 DIAGNOSIS — T2000XA Burn of unspecified degree of head, face, and neck, unspecified site, initial encounter: Secondary | ICD-10-CM | POA: Diagnosis not present

## 2017-03-22 ENCOUNTER — Ambulatory Visit (HOSPITAL_BASED_OUTPATIENT_CLINIC_OR_DEPARTMENT_OTHER): Payer: Medicare Other

## 2017-03-22 ENCOUNTER — Other Ambulatory Visit (HOSPITAL_BASED_OUTPATIENT_CLINIC_OR_DEPARTMENT_OTHER): Payer: Medicare Other

## 2017-03-22 ENCOUNTER — Ambulatory Visit (HOSPITAL_BASED_OUTPATIENT_CLINIC_OR_DEPARTMENT_OTHER): Payer: Medicare Other | Admitting: Hematology & Oncology

## 2017-03-22 VITALS — BP 106/68 | HR 60 | Temp 98.3°F | Resp 16 | Wt 143.0 lb

## 2017-03-22 DIAGNOSIS — D510 Vitamin B12 deficiency anemia due to intrinsic factor deficiency: Secondary | ICD-10-CM

## 2017-03-22 DIAGNOSIS — D500 Iron deficiency anemia secondary to blood loss (chronic): Secondary | ICD-10-CM | POA: Diagnosis not present

## 2017-03-22 DIAGNOSIS — D509 Iron deficiency anemia, unspecified: Secondary | ICD-10-CM

## 2017-03-22 DIAGNOSIS — D508 Other iron deficiency anemias: Secondary | ICD-10-CM

## 2017-03-22 LAB — IRON AND TIBC
%SAT: 53 % (ref 21–57)
Iron: 113 ug/dL (ref 41–142)
TIBC: 215 ug/dL — ABNORMAL LOW (ref 236–444)
UIBC: 102 ug/dL — AB (ref 120–384)

## 2017-03-22 LAB — COMPREHENSIVE METABOLIC PANEL
ALT: 23 U/L (ref 0–55)
AST: 18 U/L (ref 5–34)
Albumin: 3.8 g/dL (ref 3.5–5.0)
Alkaline Phosphatase: 114 U/L (ref 40–150)
Anion Gap: 7 mEq/L (ref 3–11)
BUN: 11.1 mg/dL (ref 7.0–26.0)
CALCIUM: 9.9 mg/dL (ref 8.4–10.4)
CHLORIDE: 107 meq/L (ref 98–109)
CO2: 29 meq/L (ref 22–29)
Creatinine: 1 mg/dL (ref 0.6–1.1)
EGFR: 73 mL/min/{1.73_m2} — ABNORMAL LOW (ref >90)
Glucose: 102 mg/dl (ref 70–140)
POTASSIUM: 4.2 meq/L (ref 3.5–5.1)
SODIUM: 144 meq/L (ref 136–145)
Total Bilirubin: 0.24 mg/dL (ref 0.20–1.20)
Total Protein: 7.1 g/dL (ref 6.4–8.3)

## 2017-03-22 LAB — CBC WITH DIFFERENTIAL (CANCER CENTER ONLY)
BASO#: 0 10*3/uL (ref 0.0–0.2)
BASO%: 0 % (ref 0.0–2.0)
EOS%: 0 % (ref 0.0–7.0)
Eosinophils Absolute: 0 10*3/uL (ref 0.0–0.5)
HEMATOCRIT: 35.1 % (ref 34.8–46.6)
HGB: 11.5 g/dL — ABNORMAL LOW (ref 11.6–15.9)
LYMPH#: 1.6 10*3/uL (ref 0.9–3.3)
LYMPH%: 35.4 % (ref 14.0–48.0)
MCH: 28.6 pg (ref 26.0–34.0)
MCHC: 32.8 g/dL (ref 32.0–36.0)
MCV: 87 fL (ref 81–101)
MONO#: 0.3 10*3/uL (ref 0.1–0.9)
MONO%: 6.5 % (ref 0.0–13.0)
NEUT#: 2.7 10*3/uL (ref 1.5–6.5)
NEUT%: 58.1 % (ref 39.6–80.0)
PLATELETS: 256 10*3/uL (ref 145–400)
RBC: 4.02 10*6/uL (ref 3.70–5.32)
RDW: 12.7 % (ref 11.1–15.7)
WBC: 4.6 10*3/uL (ref 3.9–10.0)

## 2017-03-22 LAB — FERRITIN: Ferritin: 805 ng/ml — ABNORMAL HIGH (ref 9–269)

## 2017-03-22 MED ORDER — CYANOCOBALAMIN 1000 MCG/ML IJ SOLN
INTRAMUSCULAR | Status: AC
  Filled 2017-03-22: qty 1

## 2017-03-22 MED ORDER — CYANOCOBALAMIN 1000 MCG/ML IJ SOLN
1000.0000 ug | Freq: Once | INTRAMUSCULAR | Status: AC
  Administered 2017-03-22: 1000 ug via INTRAMUSCULAR
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
## 2017-03-22 NOTE — Patient Instructions (Signed)
Cyanocobalamin, Vitamin B12 injection What is this medicine? CYANOCOBALAMIN (sye an oh koe BAL a min) is a man made form of vitamin B12. Vitamin B12 is used in the growth of healthy blood cells, nerve cells, and proteins in the body. It also helps with the metabolism of fats and carbohydrates. This medicine is used to treat people who can not absorb vitamin B12. This medicine may be used for other purposes; ask your health care provider or pharmacist if you have questions. COMMON BRAND NAME(S): B-12 Compliance Kit, B-12 Injection Kit, Cyomin, LA-12, Nutri-Twelve, Physicians EZ Use B-12, Primabalt What should I tell my health care provider before I take this medicine? They need to know if you have any of these conditions: -kidney disease -Leber's disease -megaloblastic anemia -an unusual or allergic reaction to cyanocobalamin, cobalt, other medicines, foods, dyes, or preservatives -pregnant or trying to get pregnant -breast-feeding How should I use this medicine? This medicine is injected into a muscle or deeply under the skin. It is usually given by a health care professional in a clinic or doctor's office. However, your doctor may teach you how to inject yourself. Follow all instructions. Talk to your pediatrician regarding the use of this medicine in children. Special care may be needed. Overdosage: If you think you have taken too much of this medicine contact a poison control center or emergency room at once. NOTE: This medicine is only for you. Do not share this medicine with others. What if I miss a dose? If you are given your dose at a clinic or doctor's office, call to reschedule your appointment. If you give your own injections and you miss a dose, take it as soon as you can. If it is almost time for your next dose, take only that dose. Do not take double or extra doses. What may interact with this medicine? -colchicine -heavy alcohol intake This list may not describe all possible  interactions. Give your health care provider a list of all the medicines, herbs, non-prescription drugs, or dietary supplements you use. Also tell them if you smoke, drink alcohol, or use illegal drugs. Some items may interact with your medicine. What should I watch for while using this medicine? Visit your doctor or health care professional regularly. You may need blood work done while you are taking this medicine. You may need to follow a special diet. Talk to your doctor. Limit your alcohol intake and avoid smoking to get the best benefit. What side effects may I notice from receiving this medicine? Side effects that you should report to your doctor or health care professional as soon as possible: -allergic reactions like skin rash, itching or hives, swelling of the face, lips, or tongue -blue tint to skin -chest tightness, pain -difficulty breathing, wheezing -dizziness -red, swollen painful area on the leg Side effects that usually do not require medical attention (report to your doctor or health care professional if they continue or are bothersome): -diarrhea -headache This list may not describe all possible side effects. Call your doctor for medical advice about side effects. You may report side effects to FDA at 1-800-FDA-1088. Where should I keep my medicine? Keep out of the reach of children. Store at room temperature between 15 and 30 degrees C (59 and 85 degrees F). Protect from light. Throw away any unused medicine after the expiration date. NOTE: This sheet is a summary. It may not cover all possible information. If you have questions about this medicine, talk to your doctor, pharmacist, or   health care provider.  2018 Elsevier/Gold Standard (2008-03-23 22:10:20)  

## 2017-03-23 LAB — VITAMIN B12: Vitamin B12: 440 pg/mL (ref 232–1245)

## 2017-03-27 DIAGNOSIS — T20211D Burn of second degree of right ear [any part, except ear drum], subsequent encounter: Secondary | ICD-10-CM | POA: Diagnosis not present

## 2017-03-29 ENCOUNTER — Other Ambulatory Visit: Payer: Self-pay | Admitting: Neurology

## 2017-03-29 NOTE — Telephone Encounter (Signed)
Called and relayed information below to pt. She verbalized understanding. Nothing further needed at this time.

## 2017-03-29 NOTE — Telephone Encounter (Signed)
[facility] pharmacy. They did receive rx on 03/19/17. It was filled but then put back and placed on hold. Tech unsure why. They can get rx ready to be picked up tomorrow. They do not have qty amount to give her today.  This refill was sent too soon. Last filled 12/01/17 for 3 months.  They can fill for one month today if she wants it today. But 3 month refill can be ready for her tomorrow.

## 2017-03-29 NOTE — Telephone Encounter (Signed)
Pt called stating that it was about a week ago that she initially called for a refill of baclofen (LIORESAL) 20 MG tablet and she just got off the phone with  [facility] - [place], [place] who stated they have not been contacted either re: this medication, Pt requesting a call back re: refill request

## 2017-05-05 ENCOUNTER — Telehealth: Payer: Self-pay | Admitting: Neurology

## 2017-05-07 ENCOUNTER — Other Ambulatory Visit: Payer: Self-pay

## 2017-05-07 MED ORDER — GABAPENTIN 300 MG PO CAPS: ORAL_CAPSULE | ORAL | 2 refills | Status: DC

## 2017-05-10 NOTE — Telephone Encounter (Signed)
Patient called and requested to speak with Dr. Anne HahnWillis' nurse regarding a refill request for her rx gabapentin.

## 2017-05-10 NOTE — Telephone Encounter (Signed)
Spoke with patient who stated she called [facility] to check on medicine. She was told that they did receive refill and would have refill ready by 9 pm tonight.  She stated typically she gets a text when refills are ready. She verbalized appreciation for call.

## 2017-05-15 DIAGNOSIS — Z79899 Other long term (current) drug therapy: Secondary | ICD-10-CM | POA: Diagnosis not present

## 2017-05-15 DIAGNOSIS — Z79891 Long term (current) use of opiate analgesic: Secondary | ICD-10-CM | POA: Diagnosis not present

## 2017-05-15 DIAGNOSIS — M47816 Spondylosis without myelopathy or radiculopathy, lumbar region: Secondary | ICD-10-CM | POA: Diagnosis not present

## 2017-05-15 DIAGNOSIS — G894 Chronic pain syndrome: Secondary | ICD-10-CM | POA: Diagnosis not present

## 2017-05-15 DIAGNOSIS — M545 Low back pain: Secondary | ICD-10-CM | POA: Diagnosis not present

## 2017-05-18 ENCOUNTER — Other Ambulatory Visit: Payer: Self-pay | Admitting: Pain Medicine

## 2017-05-18 DIAGNOSIS — M545 Low back pain: Secondary | ICD-10-CM

## 2017-06-02 ENCOUNTER — Other Ambulatory Visit: Payer: Self-pay

## 2017-06-12 DIAGNOSIS — G894 Chronic pain syndrome: Secondary | ICD-10-CM | POA: Diagnosis not present

## 2017-06-12 DIAGNOSIS — M47816 Spondylosis without myelopathy or radiculopathy, lumbar region: Secondary | ICD-10-CM | POA: Diagnosis not present

## 2017-06-12 DIAGNOSIS — M545 Low back pain: Secondary | ICD-10-CM | POA: Diagnosis not present

## 2017-06-12 DIAGNOSIS — Z79891 Long term (current) use of opiate analgesic: Secondary | ICD-10-CM | POA: Diagnosis not present

## 2017-06-12 DIAGNOSIS — Z79899 Other long term (current) drug therapy: Secondary | ICD-10-CM | POA: Diagnosis not present

## 2017-06-15 ENCOUNTER — Ambulatory Visit
Admission: RE | Admit: 2017-06-15 | Discharge: 2017-06-15 | Disposition: A | Discharge status: Home / Self Care | Payer: Medicare Other | Source: Ambulatory Visit | Attending: Pain Medicine | Admitting: Pain Medicine

## 2017-06-15 DIAGNOSIS — M5126 Other intervertebral disc displacement, lumbar region: Secondary | ICD-10-CM | POA: Diagnosis not present

## 2017-06-15 DIAGNOSIS — M545 Low back pain: Secondary | ICD-10-CM

## 2017-06-15 DIAGNOSIS — R531 Weakness: Secondary | ICD-10-CM | POA: Diagnosis not present

## 2017-06-20 ENCOUNTER — Ambulatory Visit: Payer: Medicare Other | Admitting: Adult Health

## 2017-07-17 ENCOUNTER — Encounter (HOSPITAL_COMMUNITY): Payer: Self-pay | Admitting: Emergency Medicine

## 2017-07-17 ENCOUNTER — Ambulatory Visit (INDEPENDENT_AMBULATORY_CARE_PROVIDER_SITE_OTHER): Payer: Medicare Other

## 2017-07-17 ENCOUNTER — Ambulatory Visit (HOSPITAL_COMMUNITY)
Admission: EM | Admit: 2017-07-17 | Discharge: 2017-07-17 | Disposition: A | Discharge status: Home / Self Care | Payer: Medicare Other | Attending: Family Medicine | Admitting: Family Medicine

## 2017-07-17 DIAGNOSIS — W19XXXA Unspecified fall, initial encounter: Secondary | ICD-10-CM

## 2017-07-17 DIAGNOSIS — S6991XA Unspecified injury of right wrist, hand and finger(s), initial encounter: Secondary | ICD-10-CM | POA: Diagnosis not present

## 2017-07-17 DIAGNOSIS — M7989 Other specified soft tissue disorders: Secondary | ICD-10-CM | POA: Diagnosis not present

## 2017-07-17 NOTE — Discharge Instructions (Signed)
Your thumb x-rays did not show anything broken. Please call your orthopaedist to schedule a follow up appointment.
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
## 2017-07-17 NOTE — ED Provider Notes (Signed)
  [facility]   [id number] 07/17/17 Arrival Time: 1119  ASSESSMENT & PLAN:  1. Thumb injury, right, initial encounter    X-rays negative. Recommend that she call her orthopaedist to arrange f/u. May f/u here as needed.  Reviewed expectations re: course of current medical issues. Questions answered. Outlined signs and symptoms indicating need for more acute intervention. Patient verbalized understanding. After Visit Summary given.   SUBJECTIVE:  Sherri [name] is a 51 y.o. female who presents with complaint of continued R thumb pain after falling approx 3 weeks ago. Pain with flex/ext. No swelling. "Just hasn't improved" since fall. No sensation changes. Is R-handed. No self treatment. No previous injury to thumb.  ROS: As per HPI.   OBJECTIVE:  Vitals:   07/17/17 1136  BP: 128/65  Pulse: 60  Resp: 20  Temp: 98.5 F (36.9 C)  TempSrc: Oral  SpO2: 98%     General appearance: alert; no distress Extremities: R thumb with FROM but she reports tenderness over MCP; no swelling; difficult to assess strength; sensation intact; normal capillary refil   Dg Finger Thumb Right  Result Date: 07/17/2017 CLINICAL DATA:  Right thumb pain and swelling after fall 3 weeks ago. EXAM: RIGHT THUMB 2+V COMPARISON:  None. FINDINGS: There is no evidence of fracture or dislocation. There is no evidence of arthropathy or other focal bone abnormality. Soft tissues are unremarkable IMPRESSION: Normal right thumb. Electronically Signed   By: Lupita RaiderJames  Green Jr, M.D.   On: 07/17/2017 12:20    Allergies  Allergen Reactions  . Ibuprofen Other (See Comments)    Does not take due to hx of renal insufficiency  . Tylenol [Acetaminophen] Hives    Cannot take large quantities  . Lemon Flavor Swelling    Severe Lip Swelling   . Amoxicillin Diarrhea    PMHx, SurgHx, SocialHx, Medications, and Allergies were reviewed in the Visit Navigator and updated as appropriate.      Mardella LaymanHagler, Remington Skalsky,  MD 07/17/17 [phone]

## 2017-07-18 DIAGNOSIS — S60391S Other superficial injuries of right thumb, sequela: Secondary | ICD-10-CM | POA: Diagnosis not present

## 2017-07-18 DIAGNOSIS — R945 Abnormal results of liver function studies: Secondary | ICD-10-CM | POA: Diagnosis not present

## 2017-07-18 DIAGNOSIS — G2582 Stiff-man syndrome: Secondary | ICD-10-CM | POA: Diagnosis not present

## 2017-07-18 DIAGNOSIS — E559 Vitamin D deficiency, unspecified: Secondary | ICD-10-CM | POA: Diagnosis not present

## 2017-07-23 ENCOUNTER — Other Ambulatory Visit (HOSPITAL_BASED_OUTPATIENT_CLINIC_OR_DEPARTMENT_OTHER): Payer: Medicare Other

## 2017-07-23 ENCOUNTER — Ambulatory Visit (HOSPITAL_BASED_OUTPATIENT_CLINIC_OR_DEPARTMENT_OTHER): Payer: Medicare Other

## 2017-07-23 ENCOUNTER — Telehealth: Payer: Self-pay | Admitting: *Deleted

## 2017-07-23 ENCOUNTER — Ambulatory Visit (HOSPITAL_BASED_OUTPATIENT_CLINIC_OR_DEPARTMENT_OTHER): Payer: Medicare Other | Admitting: Hematology & Oncology

## 2017-07-23 VITALS — BP 105/67 | HR 56 | Temp 98.4°F | Resp 17 | Wt 140.0 lb

## 2017-07-23 DIAGNOSIS — D509 Iron deficiency anemia, unspecified: Secondary | ICD-10-CM | POA: Diagnosis not present

## 2017-07-23 DIAGNOSIS — D500 Iron deficiency anemia secondary to blood loss (chronic): Secondary | ICD-10-CM

## 2017-07-23 DIAGNOSIS — D508 Other iron deficiency anemias: Secondary | ICD-10-CM

## 2017-07-23 DIAGNOSIS — D510 Vitamin B12 deficiency anemia due to intrinsic factor deficiency: Secondary | ICD-10-CM

## 2017-07-23 LAB — CMP (CANCER CENTER ONLY)
ALT: 21 U/L (ref 10–47)
AST: 22 U/L (ref 11–38)
Albumin: 3.3 g/dL (ref 3.3–5.5)
Alkaline Phosphatase: 92 U/L — ABNORMAL HIGH (ref 26–84)
BUN, Bld: 9 mg/dL (ref 7–22)
CALCIUM: 9.4 mg/dL (ref 8.0–10.3)
CO2: 31 meq/L (ref 18–33)
Chloride: 108 mEq/L (ref 98–108)
Creat: 1 mg/dl (ref 0.6–1.2)
Glucose, Bld: 145 mg/dL — ABNORMAL HIGH (ref 73–118)
POTASSIUM: 3.8 meq/L (ref 3.3–4.7)
Sodium: 139 mEq/L (ref 128–145)
Total Bilirubin: 0.5 mg/dl (ref 0.20–1.60)
Total Protein: 6.9 g/dL (ref 6.4–8.1)

## 2017-07-23 LAB — IRON AND TIBC
%SAT: 31 % (ref 21–57)
Iron: 69 ug/dL (ref 41–142)
TIBC: 224 ug/dL — AB (ref 236–444)
UIBC: 155 ug/dL (ref 120–384)

## 2017-07-23 LAB — CBC WITH DIFFERENTIAL (CANCER CENTER ONLY)
BASO#: 0 10*3/uL (ref 0.0–0.2)
BASO%: 0 % (ref 0.0–2.0)
EOS%: 0 % (ref 0.0–7.0)
Eosinophils Absolute: 0 10*3/uL (ref 0.0–0.5)
HEMATOCRIT: 34.2 % — AB (ref 34.8–46.6)
HGB: 11.3 g/dL — ABNORMAL LOW (ref 11.6–15.9)
LYMPH#: 1.9 10*3/uL (ref 0.9–3.3)
LYMPH%: 29.8 % (ref 14.0–48.0)
MCH: 29 pg (ref 26.0–34.0)
MCHC: 33 g/dL (ref 32.0–36.0)
MCV: 88 fL (ref 81–101)
MONO#: 0.4 10*3/uL (ref 0.1–0.9)
MONO%: 5.6 % (ref 0.0–13.0)
NEUT#: 4 10*3/uL (ref 1.5–6.5)
NEUT%: 64.6 % (ref 39.6–80.0)
Platelets: 254 10*3/uL (ref 145–400)
RBC: 3.89 10*6/uL (ref 3.70–5.32)
RDW: 12.5 % (ref 11.1–15.7)
WBC: 6.2 10*3/uL (ref 3.9–10.0)

## 2017-07-23 LAB — FERRITIN: Ferritin: 583 ng/ml — ABNORMAL HIGH (ref 9–269)

## 2017-07-23 MED ORDER — CYANOCOBALAMIN 1000 MCG/ML IJ SOLN
INTRAMUSCULAR | Status: AC
  Filled 2017-07-23: qty 1

## 2017-07-23 MED ORDER — CYANOCOBALAMIN 1000 MCG/ML IJ SOLN
1000.0000 ug | Freq: Once | INTRAMUSCULAR | Status: AC
  Administered 2017-07-23: 1000 ug via INTRAMUSCULAR
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
## 2017-07-23 NOTE — Telephone Encounter (Signed)
-----   Message from Josph MachoPeter R Ennever, MD sent at 07/23/2017  3:15 PM EDT ----- Call - iron level is ok!!  Sherri [name]

## 2017-07-24 ENCOUNTER — Telehealth: Payer: Self-pay

## 2017-07-24 LAB — VITAMIN B12: VITAMIN B 12: 358 pg/mL (ref 232–1245)

## 2017-07-24 NOTE — Telephone Encounter (Addendum)
-----   Message from Josph MachoPeter R Ennever, MD sent at 07/23/2017  3:15 PM EDT ----- Call - iron level is ok!!  Cindee LamePete  Above message left on personalized VM. dph

## 2017-08-08 ENCOUNTER — Ambulatory Visit (HOSPITAL_COMMUNITY)
Admission: EM | Admit: 2017-08-08 | Discharge: 2017-08-08 | Disposition: A | Discharge status: Home / Self Care | Payer: Medicare Other | Attending: Family Medicine | Admitting: Family Medicine

## 2017-08-08 ENCOUNTER — Encounter (HOSPITAL_COMMUNITY): Payer: Self-pay | Admitting: Emergency Medicine

## 2017-08-08 DIAGNOSIS — W19XXXA Unspecified fall, initial encounter: Secondary | ICD-10-CM

## 2017-08-08 DIAGNOSIS — R51 Headache: Secondary | ICD-10-CM

## 2017-08-08 DIAGNOSIS — S00511A Abrasion of lip, initial encounter: Secondary | ICD-10-CM | POA: Diagnosis not present

## 2017-08-08 MED ORDER — BENZOCAINE 7.5 % MT GEL: Freq: Three times a day (TID) | OROMUCOSAL | 0 refills | Status: DC | PRN

## 2017-08-08 NOTE — Discharge Instructions (Signed)
Use Orajel on affected area. Practice good mouth hygiene. Ice compress was swelling. Follow-up with PCP for wound check and further management needed. If experiencing increasing headache, sensitivity to light, nausea, vomiting, weakness, drowsiness, follow-up at the emergency department for further evaluation.

## 2017-08-08 NOTE — ED Triage Notes (Signed)
PT has stiff person syndrome. PT fell yesterday onto face. PT bit lower lip and hit her nose. PT reports pain in lip and left face/nostril. PT reports pain in left hip earlier today, but that has improved significantly.

## 2017-08-08 NOTE — ED Provider Notes (Signed)
[facility]    CSN: [id number] Arrival date & time: 08/08/17  1345     History   Chief Complaint Chief Complaint  Patient presents with  . Fall    HPI Sherri SlimmerSamantha [name] is a 51 y.o. female.   51 year old female comes in for lip abrasion after falling yesterday. She has been using ice compress with good relief. She also hit her nose, had nosebleed yesterday, but has since resolved. Patient denies loss of consciousness after the fall. Has a mild headache due to the fall, denies nausea, vomiting, photophobia, phonophobia, weakness. She cannot take NSAIDs due to chronic kidney disease, and allergies to Tylenol with hives.      Past Medical History:  Diagnosis Date  . Angioedema   . Anxiety   . Arthritis   . Asthma   . CAP (community acquired pneumonia) 08/29/2012  . Chronic back pain   . Chronic kidney disease   . Cocaine abuse   . Depression   . Diarrhea   . DVT (deep venous thrombosis) (HCC)   . Environmental allergies   . Fall   . GERD (gastroesophageal reflux disease)   . Heart murmur    has been told once that she has a heart murmur, but has never had any problems  . Hiatal hernia 03/21/2013  . Lumbar herniated disc   . Peripheral vascular disease (HCC)   . Pernicious anemia 10/30/2011  . Postconcussion syndrome 12/30/2014  . Seasonal allergies   . Stiff person syndrome   . Urticaria     Patient Active Problem List   Diagnosis Date Noted  . Depression 01/02/2017  . History of DVT (deep vein thrombosis) 01/02/2017  . Chronic pain syndrome 01/02/2017  . Cocaine abuse 01/02/2017  . Dysphagia   . Adjustment insomnia   . Distal radius fracture, right 08/16/2016  . Postconcussion syndrome 12/30/2014  . Dizziness 12/18/2014  . Tremor 03/11/2014  . 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 Hiatal hernia 03/21/2013  . Anemia, iron deficiency 01/29/2013  . Spasm of muscle 09/02/2012  . Benzodiazepine withdrawal (HCC) 08/30/2012  . CAP (community acquired  pneumonia) 08/29/2012  . Hypernatremia 08/28/2012  . Metabolic acidosis 08/28/2012  . Leukocytosis 08/28/2012  . UTI (lower urinary tract infection) 08/28/2012  . Anemia 08/28/2012  . Acute back pain 08/27/2012  . Rhabdomyolysis 08/27/2012  . ARF (acute renal failure) (HCC) 08/27/2012  . Hypokalemia 08/27/2012  . Asthma 08/27/2012  . Chronic back pain   . Pernicious anemia 10/30/2011  . DEGENERATIVE DISC DISEASE 08/18/2010  . DEEP VEIN THROMBOSIS/PHLEBITIS 07/28/2010  . Asthma with bronchitis 07/28/2010  . URTICARIA 07/28/2010    Past Surgical History:  Procedure Laterality Date  . BUNIONECTOMY Left   . colonoscopy  07/09/15  . inguinal hernia 1983  1983  . OPEN REDUCTION INTERNAL FIXATION (ORIF) DISTAL RADIAL FRACTURE Right 08/16/2016   Procedure: OPEN REDUCTION INTERNAL FIXATION (ORIF) DISTAL RADIAL FRACTURE;  Surgeon: Eldred MangesMark C Yates, MD;  Location: [facility];  Service: Orthopedics;  Laterality: Right;    OB History    No data available       Home Medications    Prior to Admission medications   Medication Sig Start Date End Date Taking? Authorizing Provider  albuterol (PROVENTIL HFA;VENTOLIN HFA) 108 (90 BASE) MCG/ACT inhaler Inhale 1 puff into the lungs every 6 (six) hours as needed. PRO AIR 06/20/13   Jetty DuhamelYoung, Clinton D, MD  baclofen (LIORESAL) 20 MG tablet TAKE ONE & ONE-HALF TABLETS BY MOUTH IN THE MORNING AND  IN THE EVENING AND ONE AT  MIDDAY 03/19/17   York Spaniel, MD  benzocaine (BABY ORAJEL) 7.5 % oral gel Use as directed in the mouth or throat 3 (three) times daily as needed for pain. 08/08/17   Belinda Fisher, PA-C  Cholecalciferol (VITAMIN D3) 5000 units CAPS Take by mouth.    [provider]  cyanocobalamin (,VITAMIN B-12,) 1000 MCG/ML injection Inject 1,000 mcg into the muscle every 3 (three) months.     [provider]  desloratadine (CLARINEX) 5 MG tablet Take 1 tablet (5 mg total) by mouth daily. 11/06/11   Waymon Budge, MD  diazepam (VALIUM)  10 MG tablet Take 2 tablets (20 mg total) by mouth 3 (three) times daily. 01/09/17   York Spaniel, MD  diphenhydrAMINE (BENADRYL) 25 MG tablet Take 25 mg by mouth at bedtime as needed for itching or allergies.     [provider]  EPIPEN 2-PAK 0.3 MG/0.3ML SOAJ injection USE AS DIRECTED FOR  SEVERE  ALLERGIC  REACTIONS 08/16/15   Jetty Duhamel D, MD  FLUoxetine (PROZAC) 10 MG capsule Take 10 mg by mouth daily.    [provider]  Fluticasone-Salmeterol (ADVAIR) 250-50 MCG/DOSE AEPB Inhale 1 puff into the lungs 2 (two) times daily.  03/14/12   Waymon Budge, MD  gabapentin (NEURONTIN) 300 MG capsule 2 capsules in the morning and evening, one capsule at midday 05/07/17   York Spaniel, MD  Menthol, Topical Analgesic, 3.7 % GEL Apply topically as needed.    [provider]  montelukast (SINGULAIR) 10 MG tablet Take 10 mg by mouth at bedtime.    [provider]  OVER THE COUNTER MEDICATION I B Guard    [provider]    Family History Family History  Problem Relation Age of Onset  . Cancer Mother   . Other Mother   . COPD Father   . Asthma Brother   . Cancer Brother        colon  . Heart attack Brother   . Seizures Brother     Social History Social History  Substance Use Topics  . Smoking status: Never Smoker  . Smokeless tobacco: Never Used     Comment: Never Used Tobacco  . Alcohol use 0.0 oz/week     Comment: Occasional     Allergies   Ibuprofen; Tylenol [acetaminophen]; Lemon flavor; and Amoxicillin   Review of Systems Review of Systems  Reason unable to perform ROS: See HPI as above.     Physical Exam Triage Vital Signs ED Triage Vitals  Enc Vitals Group     BP 08/08/17 1441 99/66     Pulse Rate 08/08/17 1441 78     Resp 08/08/17 1441 16     Temp 08/08/17 1441 98.3 F (36.8 C)     Temp Source 08/08/17 1441 Oral     SpO2 08/08/17 1441 96 %     Weight 08/08/17 1442 130 lb 12.8 oz (59.3 kg)     Height  08/08/17 1442 5\' 3"  (1.6 m)     Head Circumference --      Peak Flow --      Pain Score 08/08/17 1442 6     Pain Loc --      Pain Edu? --      Excl. in GC? --    No data found.   Updated Vital Signs BP 99/66   Pulse 78   Temp 98.3 F (36.8 C) (Oral)  Resp 16   Ht 5\' 3"  (1.6 m)   Wt 130 lb 12.8 oz (59.3 kg)   LMP 07/29/2012   SpO2 96%   BMI 23.17 kg/m   Visual Acuity Right Eye Distance:   Left Eye Distance:   Bilateral Distance:    Right Eye Near:   Left Eye Near:    Bilateral Near:     Physical Exam  Constitutional: She is oriented to person, place, and time. She appears well-developed and well-nourished. No distress.  Eyes: Pupils are equal, round, and reactive to light. Conjunctivae and EOM are normal.  Cardiovascular: Normal rate, regular rhythm and normal heart sounds.  Exam reveals no gallop and no friction rub.   No murmur heard. Pulmonary/Chest: Effort normal and breath sounds normal. She has no wheezes. She has no rales.  Neurological: She is alert and oriented to person, place, and time. She has normal strength. She is not disoriented. No cranial nerve deficit or sensory deficit.  Skin:  2 cm x 0.5 cm abrasion lower inner lip. With lip swelling.     UC Treatments / Results  Labs (all labs ordered are listed, but only abnormal results are displayed) Labs Reviewed - No data to display  EKG  EKG Interpretation None       Radiology No results found.  Procedures Procedures (including critical care time)  Medications Ordered in UC Medications - No data to display   Initial Impression / Assessment and Plan / UC Course  I have reviewed the triage vital signs and the nursing notes.  Pertinent labs & imaging results that were available during my care of the patient were reviewed by me and considered in my medical decision making (see chart for details).   benzocaine on affected area. Practice good mouth hygiene. Follow-up with PCP for wound  check and further evaluation needed. Patient without nausea, vomiting, lethargy, neurology exam normal without focal defects, low suspicion for intracranial bleed. Strict return precautions given. Patient expresses understanding and agrees to plan.   Final Clinical Impressions(s) / UC Diagnoses   Final diagnoses:  Fall, initial encounter  Abrasion of lip, initial encounter    New Prescriptions New Prescriptions   BENZOCAINE (BABY ORAJEL) 7.5 % ORAL GEL    Use as directed in the mouth or throat 3 (three) times daily as needed for pain.      Belinda Fisher, PA-C 08/08/17 [phone]

## 2017-08-09 ENCOUNTER — Telehealth: Payer: Self-pay | Admitting: *Deleted

## 2017-08-09 ENCOUNTER — Telehealth (HOSPITAL_COMMUNITY): Payer: Self-pay | Admitting: Family Medicine

## 2017-08-09 MED ORDER — FLUOXETINE HCL 10 MG PO CAPS: 10.0000 mg | ORAL_CAPSULE | Freq: Every day | ORAL | 3 refills | Status: DC

## 2017-08-09 NOTE — Telephone Encounter (Signed)
Called and spoke with pt. Advised we received refill requests for the following medications: gabapentin, baclofen, fluoxetine and diazepam.   [facility] and baclofen. She verified she does not need refills on these. She has enough diazepam right now, does not need refill.   She ONLY needs refill on fluoxetine. Last refills received 08/14/16 for 1 year supply refills from Dr Anne HahnWillis. Advised I will send request to Dr Anne HahnWillis

## 2017-08-09 NOTE — Telephone Encounter (Signed)
Pt requesting stronger pain medication. Spoke with Dr. Tracie HarrierHagler and will be printing prescription for patient to pick up.

## 2017-08-09 NOTE — Telephone Encounter (Signed)
Refill for Prozac given.

## 2017-08-10 DIAGNOSIS — S01511A Laceration without foreign body of lip, initial encounter: Secondary | ICD-10-CM | POA: Diagnosis not present

## 2017-08-10 DIAGNOSIS — W1830XA Fall on same level, unspecified, initial encounter: Secondary | ICD-10-CM | POA: Diagnosis not present

## 2017-08-14 ENCOUNTER — Other Ambulatory Visit: Payer: Self-pay | Admitting: Neurology

## 2017-08-14 NOTE — Telephone Encounter (Signed)
Faxed printed/signed rx diazepam to  [id number] - , [place] - [address] [phone] (Phone) [phone] (Fax)  Received fax confirmation.

## 2017-08-21 ENCOUNTER — Ambulatory Visit: Payer: Medicare Other | Admitting: Adult Health

## 2017-08-24 ENCOUNTER — Ambulatory Visit (HOSPITAL_COMMUNITY)
Admission: EM | Admit: 2017-08-24 | Discharge: 2017-08-24 | Disposition: A | Discharge status: Home / Self Care | Payer: Medicare Other | Attending: Physician Assistant | Admitting: Physician Assistant

## 2017-08-24 ENCOUNTER — Encounter (HOSPITAL_COMMUNITY): Payer: Self-pay | Admitting: *Deleted

## 2017-08-24 DIAGNOSIS — J00 Acute nasopharyngitis [common cold]: Secondary | ICD-10-CM

## 2017-08-24 MED ORDER — CETIRIZINE HCL 10 MG PO TABS: 10.0000 mg | ORAL_TABLET | Freq: Every day | ORAL | 0 refills | Status: DC

## 2017-08-24 MED ORDER — FLUTICASONE PROPIONATE 50 MCG/ACT NA SUSP: 2.0000 | Freq: Every day | NASAL | 0 refills | Status: DC

## 2017-08-24 MED ORDER — BENZONATATE 100 MG PO CAPS: 100.0000 mg | ORAL_CAPSULE | Freq: Three times a day (TID) | ORAL | 0 refills | Status: DC

## 2017-08-24 NOTE — ED Provider Notes (Signed)
[facility]    CSN: [id number] Arrival date & time: 08/24/17  1015     History   Chief Complaint Chief Complaint  Patient presents with  . Cough  . Nasal Congestion    HPI Sherri [name] is a 51 y.o. female.   51 year old female comes in for 3 day history of cough and nasal congestion. She states she also has some substernal chest pain with coughing. She has needed to use her albuterol inhaler more often since symptoms started due to wheezing, but has not had shortness of breath, trouble breathing. She states she recently finished antibiotics for wound healing, does not recall medication name. Otherwise has not tried any other medications. She is worried about pneumonia, given she has grandkids were sick, one with pneumonia. Cough is productive, worse at night and morning. States throat is dry. Denies ear pain, eye pain. Denies abdominal pain, nausea, vomiting, diarrhea. Denies fever, chills, night sweats.      Past Medical History:  Diagnosis Date  . Angioedema   . Anxiety   . Arthritis   . Asthma   . CAP (community acquired pneumonia) 08/29/2012  . Chronic back pain   . Chronic kidney disease   . Cocaine abuse   . Depression   . Diarrhea   . DVT (deep venous thrombosis) (HCC)   . Environmental allergies   . Fall   . GERD (gastroesophageal reflux disease)   . Heart murmur    has been told once that she has a heart murmur, but has never had any problems  . Hiatal hernia 03/21/2013  . Lumbar herniated disc   . Peripheral vascular disease (HCC)   . Pernicious anemia 10/30/2011  . Postconcussion syndrome 12/30/2014  . Seasonal allergies   . Stiff person syndrome   . Urticaria     Patient Active Problem List   Diagnosis Date Noted  . Depression 01/02/2017  . History of DVT (deep vein thrombosis) 01/02/2017  . Chronic pain syndrome 01/02/2017  . Cocaine abuse 01/02/2017  . Dysphagia   . Adjustment insomnia   . Distal radius fracture, right 08/16/2016  .  Postconcussion syndrome 12/30/2014  . Dizziness 12/18/2014  . Tremor 03/11/2014  . 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 Hiatal hernia 03/21/2013  . Anemia, iron deficiency 01/29/2013  . Spasm of muscle 09/02/2012  . Benzodiazepine withdrawal (HCC) 08/30/2012  . CAP (community acquired pneumonia) 08/29/2012  . Hypernatremia 08/28/2012  . Metabolic acidosis 08/28/2012  . Leukocytosis 08/28/2012  . UTI (lower urinary tract infection) 08/28/2012  . Anemia 08/28/2012  . Acute back pain 08/27/2012  . Rhabdomyolysis 08/27/2012  . ARF (acute renal failure) (HCC) 08/27/2012  . Hypokalemia 08/27/2012  . Asthma 08/27/2012  . Chronic back pain   . Pernicious anemia 10/30/2011  . DEGENERATIVE DISC DISEASE 08/18/2010  . DEEP VEIN THROMBOSIS/PHLEBITIS 07/28/2010  . Asthma with bronchitis 07/28/2010  . URTICARIA 07/28/2010    Past Surgical History:  Procedure Laterality Date  . BUNIONECTOMY Left   . colonoscopy  07/09/15  . inguinal hernia 1983  1983  . OPEN REDUCTION INTERNAL FIXATION (ORIF) DISTAL RADIAL FRACTURE Right 08/16/2016   Procedure: OPEN REDUCTION INTERNAL FIXATION (ORIF) DISTAL RADIAL FRACTURE;  Surgeon: Eldred Manges, MD;  Location: [facility];  Service: Orthopedics;  Laterality: Right;    OB History    No data available       Home Medications    Prior to Admission medications   Medication Sig Start Date End Date Taking? Authorizing Provider  albuterol (PROVENTIL HFA;VENTOLIN HFA) 108 (90 BASE) MCG/ACT inhaler Inhale 1 puff into the lungs every 6 (six) hours as needed. PRO AIR 06/20/13   Young, Joni Fears D, MD  baclofen (LIORESAL) 20 MG tablet TAKE ONE & ONE-HALF TABLETS BY MOUTH IN THE MORNING AND IN THE EVENING AND ONE AT  MIDDAY 03/19/17   York Spaniel, MD  benzocaine (BABY ORAJEL) 7.5 % oral gel Use as directed in the mouth or throat 3 (three) times daily as needed for pain. 08/08/17   Cathie Hoops, Kirstine Jacquin V, PA-C  benzonatate (TESSALON) 100 MG capsule Take 1 capsule (100 mg total) by  mouth every 8 (eight) hours. 08/24/17   Cathie Hoops, Ronan Duecker V, PA-C  cetirizine (ZYRTEC ALLERGY) 10 MG tablet Take 1 tablet (10 mg total) by mouth daily. 08/24/17   Belinda Fisher, PA-C  Cholecalciferol (VITAMIN D3) 5000 units CAPS Take by mouth.    [provider]  cyanocobalamin (,VITAMIN B-12,) 1000 MCG/ML injection Inject 1,000 mcg into the muscle every 3 (three) months.     [provider]  desloratadine (CLARINEX) 5 MG tablet Take 1 tablet (5 mg total) by mouth daily. 11/06/11   Jetty Duhamel D, MD  diazepam (VALIUM) 10 MG tablet TAKE 2 TABLETS BY MOUTH 3 TIMES A DAY 08/14/17   York Spaniel, MD  diphenhydrAMINE (BENADRYL) 25 MG tablet Take 25 mg by mouth at bedtime as needed for itching or allergies.     [provider]  EPIPEN 2-PAK 0.3 MG/0.3ML SOAJ injection USE AS DIRECTED FOR  SEVERE  ALLERGIC  REACTIONS 08/16/15   Jetty Duhamel D, MD  FLUoxetine (PROZAC) 10 MG capsule Take 1 capsule (10 mg total) by mouth daily. 08/09/17   York Spaniel, MD  fluticasone (FLONASE) 50 MCG/ACT nasal spray Place 2 sprays into both nostrils daily. 08/24/17   Cathie Hoops, Jamiria Langill V, PA-C  Fluticasone-Salmeterol (ADVAIR) 250-50 MCG/DOSE AEPB Inhale 1 puff into the lungs 2 (two) times daily.  03/14/12   Waymon Budge, MD  gabapentin (NEURONTIN) 300 MG capsule 2 capsules in the morning and evening, one capsule at midday 05/07/17   York Spaniel, MD  Menthol, Topical Analgesic, 3.7 % GEL Apply topically as needed.    [provider]  montelukast (SINGULAIR) 10 MG tablet Take 10 mg by mouth at bedtime.    [provider]  OVER THE COUNTER MEDICATION I B Guard    [provider]    Family History Family History  Problem Relation Age of Onset  . Cancer Mother   . Other Mother   . COPD Father   . Asthma Brother   . Cancer Brother        colon  . Heart attack Brother   . Seizures Brother     Social History Social History  Substance Use Topics  . Smoking status: Never  Smoker  . Smokeless tobacco: Never Used     Comment: Never Used Tobacco  . Alcohol use 0.0 oz/week     Comment: Occasional     Allergies   Ibuprofen; Tylenol [acetaminophen]; Lemon flavor; and Amoxicillin   Review of Systems Review of Systems  Reason unable to perform ROS: See HPI as above.     Physical Exam Triage Vital Signs ED Triage Vitals [08/24/17 1135]  Enc Vitals Group     BP 109/62     Pulse Rate (!) 53     Resp 17     Temp 98 F (36.7 C)  Temp Source Oral     SpO2 100 %     Weight      Height      Head Circumference      Peak Flow      Pain Score      Pain Loc      Pain Edu?      Excl. in GC?    No data found.   Updated Vital Signs BP 109/62 (BP Location: Left Arm)   Pulse (!) 53   Temp 98 F (36.7 C) (Oral)   Resp 17   LMP 07/29/2012   SpO2 100%     Physical Exam  Constitutional: She is oriented to person, place, and time. She appears well-developed and well-nourished. No distress.  HENT:  Head: Normocephalic and atraumatic.  Right Ear: External ear and ear canal normal.  Left Ear: External ear and ear canal normal.  Nose: Mucosal edema present. Right sinus exhibits no maxillary sinus tenderness and no frontal sinus tenderness. Left sinus exhibits no maxillary sinus tenderness and no frontal sinus tenderness.  Mouth/Throat: Uvula is midline and mucous membranes are normal. Posterior oropharyngeal erythema present. Tonsils are 1+ on the right. Tonsils are 1+ on the left. No tonsillar exudate.  Opaque TM.   Eyes: Pupils are equal, round, and reactive to light. Conjunctivae are normal.  Neck: Normal range of motion. Neck supple.  Cardiovascular: Normal rate, regular rhythm and normal heart sounds.  Exam reveals no gallop and no friction rub.   No murmur heard. Pulmonary/Chest: Effort normal and breath sounds normal. She has no decreased breath sounds. She has no wheezes. She has no rhonchi. She has no rales.  Lymphadenopathy:    She has no  cervical adenopathy.  Neurological: She is alert and oriented to person, place, and time.  Skin: Skin is warm and dry.  Psychiatric: She has a normal mood and affect. Her behavior is normal. Judgment normal.     UC Treatments / Results  Labs (all labs ordered are listed, but only abnormal results are displayed) Labs Reviewed - No data to display  EKG  EKG Interpretation None       Radiology No results found.  Procedures Procedures (including critical care time)  Medications Ordered in UC Medications - No data to display   Initial Impression / Assessment and Plan / UC Course  I have reviewed the triage vital signs and the nursing notes.  Pertinent labs & imaging results that were available during my care of the patient were reviewed by me and considered in my medical decision making (see chart for details).    Discussed with patient history and exam most consistent with viral URI with nasal congestion. Lung exam normal without adventitious lung sounds, afebrile, O2 sat 100% on room air, low suspicion for pneumonia right now. Discussed with patient productive cough most likely due to postnasal drainage. Symptomatic treatment as needed. Push fluids. Return precautions given.    Final Clinical Impressions(s) / UC Diagnoses   Final diagnoses:  Acute nasopharyngitis    New Prescriptions New Prescriptions   BENZONATATE (TESSALON) 100 MG CAPSULE    Take 1 capsule (100 mg total) by mouth every 8 (eight) hours.   CETIRIZINE (ZYRTEC ALLERGY) 10 MG TABLET    Take 1 tablet (10 mg total) by mouth daily.   FLUTICASONE (FLONASE) 50 MCG/ACT NASAL SPRAY    Place 2 sprays into both nostrils daily.      Belinda FisherYu, Romin Divita V, PA-C 08/24/17 1208

## 2017-08-24 NOTE — ED Triage Notes (Signed)
Patient reports nasal congestion and cough since Tuesday.

## 2017-08-24 NOTE — Discharge Instructions (Signed)
Tessalon for cough. Start flonase, zyrtec for nasal congestion. You can use over the counter nasal saline rinse such as neti pot for nasal congestion. Keep hydrated, your urine should be clear to pale yellow in color. Tylenol/motrin for fever and pain. Monitor for any worsening of symptoms, chest pain, shortness of breath, wheezing, swelling of the throat, follow up for reevaluation.

## 2017-09-06 ENCOUNTER — Emergency Department (HOSPITAL_COMMUNITY)
Admission: EM | Admit: 2017-09-06 | Discharge: 2017-09-06 | Disposition: A | Discharge status: Home / Self Care | Payer: Medicare Other | Attending: Emergency Medicine | Admitting: Emergency Medicine

## 2017-09-06 ENCOUNTER — Encounter (HOSPITAL_COMMUNITY): Payer: Self-pay

## 2017-09-06 ENCOUNTER — Emergency Department (HOSPITAL_COMMUNITY): Payer: Medicare Other

## 2017-09-06 DIAGNOSIS — N189 Chronic kidney disease, unspecified: Secondary | ICD-10-CM | POA: Insufficient documentation

## 2017-09-06 DIAGNOSIS — J0100 Acute maxillary sinusitis, unspecified: Secondary | ICD-10-CM

## 2017-09-06 DIAGNOSIS — Z79899 Other long term (current) drug therapy: Secondary | ICD-10-CM | POA: Insufficient documentation

## 2017-09-06 DIAGNOSIS — R05 Cough: Secondary | ICD-10-CM | POA: Diagnosis not present

## 2017-09-06 DIAGNOSIS — R0981 Nasal congestion: Secondary | ICD-10-CM | POA: Diagnosis present

## 2017-09-06 DIAGNOSIS — J45909 Unspecified asthma, uncomplicated: Secondary | ICD-10-CM | POA: Insufficient documentation

## 2017-09-06 DIAGNOSIS — J989 Respiratory disorder, unspecified: Secondary | ICD-10-CM | POA: Diagnosis not present

## 2017-09-06 MED ORDER — DOXYCYCLINE HYCLATE 100 MG PO CAPS: 100.0000 mg | ORAL_CAPSULE | Freq: Two times a day (BID) | ORAL | 0 refills | Status: DC

## 2017-09-06 MED ORDER — BENZONATATE 100 MG PO CAPS: 100.0000 mg | ORAL_CAPSULE | Freq: Three times a day (TID) | ORAL | 0 refills | Status: DC

## 2017-09-06 NOTE — ED Provider Notes (Signed)
[facility]-EMER[facility]ENCY DEPT Provider Note   CSN: [id number] Arrival date & time: 09/06/17  1019     History   Chief Complaint No chief complaint on file.   HPI Sherri [name] is a 51 y.o. female presenting with nasal congestion, cough, and hoarse voice 2 weeks.  Patient states that the end of last month, she was seen in urgent care and diagnosed with a viral URI. She was treated symptomatically. She reports today, she has had persistent symptoms, and feels they have been worsening. She reports continued nasal congestion with sinus pressure, hoarse voice, and cough. Her cough is productivewith yellow sputum. She reports chest pain when she coughs, this is bilateral, and present only when she coughs. She has been taking Flonase, Zyrtec, and Tessalon Perles. She has been trying to increase her water intake. She denies fevers, chills, nausea, vomiting, abdominal pain, urinary symptoms, or abnormal bowel movements. She is not a sore throat. She spent time with her grandchildren, who had viral URI symptoms, several days before becoming sick. No other sick contacts.  HPI  Past Medical History:  Diagnosis Date  . Angioedema   . Anxiety   . Arthritis   . Asthma   . CAP (community acquired pneumonia) 08/29/2012  . Chronic back pain   . Chronic kidney disease   . Cocaine abuse   . Depression   . Diarrhea   . DVT (deep venous thrombosis) (HCC)   . Environmental allergies   . Fall   . GERD (gastroesophageal reflux disease)   . Heart murmur    has been told once that she has a heart murmur, but has never had any problems  . Hiatal hernia 03/21/2013  . Lumbar herniated disc   . Peripheral vascular disease (HCC)   . Pernicious anemia 10/30/2011  . Postconcussion syndrome 12/30/2014  . Seasonal allergies   . Stiff person syndrome   . Urticaria     Patient Active Problem List   Diagnosis Date Noted  . Depression 01/02/2017  . History of DVT (deep vein thrombosis) 01/02/2017  . Chronic pain  syndrome 01/02/2017  . Cocaine abuse 01/02/2017  . Dysphagia   . Adjustment insomnia   . Distal radius fracture, right 08/16/2016  . Postconcussion syndrome 12/30/2014  . Dizziness 12/18/2014  . Tremor 03/11/2014  . 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 Hiatal hernia 03/21/2013  . Anemia, iron deficiency 01/29/2013  . Spasm of muscle 09/02/2012  . Benzodiazepine withdrawal (HCC) 08/30/2012  . CAP (community acquired pneumonia) 08/29/2012  . Hypernatremia 08/28/2012  . Metabolic acidosis 08/28/2012  . Leukocytosis 08/28/2012  . UTI (lower urinary tract infection) 08/28/2012  . Anemia 08/28/2012  . Acute back pain 08/27/2012  . Rhabdomyolysis 08/27/2012  . ARF (acute renal failure) (HCC) 08/27/2012  . Hypokalemia 08/27/2012  . Asthma 08/27/2012  . Chronic back pain   . Pernicious anemia 10/30/2011  . DEGENERATIVE DISC DISEASE 08/18/2010  . DEEP VEIN THROMBOSIS/PHLEBITIS 07/28/2010  . Asthma with bronchitis 07/28/2010  . URTICARIA 07/28/2010    Past Surgical History:  Procedure Laterality Date  . BUNIONECTOMY Left   . colonoscopy  07/09/15  . inguinal hernia 1983  1983  . OPEN REDUCTION INTERNAL FIXATION (ORIF) DISTAL RADIAL FRACTURE Right 08/16/2016   Procedure: OPEN REDUCTION INTERNAL FIXATION (ORIF) DISTAL RADIAL FRACTURE;  Surgeon: Eldred MangesMark C Yates, MD;  Location: [facility] OR;  Service: Orthopedics;  Laterality: Right;    OB History    No data available       Home Medications  Prior to Admission medications   Medication Sig Start Date End Date Taking? Authorizing Provider  albuterol (PROVENTIL HFA;VENTOLIN HFA) 108 (90 BASE) MCG/ACT inhaler Inhale 1 puff into the lungs every 6 (six) hours as needed. PRO AIR 06/20/13   Young, Joni Fears D, MD  baclofen (LIORESAL) 20 MG tablet TAKE ONE & ONE-HALF TABLETS BY MOUTH IN THE MORNING AND IN THE EVENING AND ONE AT  MIDDAY 03/19/17   York Spaniel, MD  benzocaine (BABY ORAJEL) 7.5 % oral gel Use as directed in the mouth or throat 3  (three) times daily as needed for pain. 08/08/17   Cathie Hoops, Amy V, PA-C  benzonatate (TESSALON) 100 MG capsule Take 1 capsule (100 mg total) by mouth every 8 (eight) hours. 09/06/17   Laney Louderback, PA-C  cetirizine (ZYRTEC ALLERGY) 10 MG tablet Take 1 tablet (10 mg total) by mouth daily. 08/24/17   Belinda Fisher, PA-C  Cholecalciferol (VITAMIN D3) 5000 units CAPS Take by mouth.    [provider]  cyanocobalamin (,VITAMIN B-12,) 1000 MCG/ML injection Inject 1,000 mcg into the muscle every 3 (three) months.     [provider]  desloratadine (CLARINEX) 5 MG tablet Take 1 tablet (5 mg total) by mouth daily. 11/06/11   Jetty Duhamel D, MD  diazepam (VALIUM) 10 MG tablet TAKE 2 TABLETS BY MOUTH 3 TIMES A DAY 08/14/17   York Spaniel, MD  diphenhydrAMINE (BENADRYL) 25 MG tablet Take 25 mg by mouth at bedtime as needed for itching or allergies.     [provider]  doxycycline (VIBRAMYCIN) 100 MG capsule Take 1 capsule (100 mg total) by mouth 2 (two) times daily. 09/06/17   Axl Rodino, PA-C  EPIPEN 2-PAK 0.3 MG/0.3ML SOAJ injection USE AS DIRECTED FOR  SEVERE  ALLERGIC  REACTIONS 08/16/15   Jetty Duhamel D, MD  FLUoxetine (PROZAC) 10 MG capsule Take 1 capsule (10 mg total) by mouth daily. 08/09/17   York Spaniel, MD  fluticasone (FLONASE) 50 MCG/ACT nasal spray Place 2 sprays into both nostrils daily. 08/24/17   Cathie Hoops, Amy V, PA-C  Fluticasone-Salmeterol (ADVAIR) 250-50 MCG/DOSE AEPB Inhale 1 puff into the lungs 2 (two) times daily.  03/14/12   Waymon Budge, MD  gabapentin (NEURONTIN) 300 MG capsule 2 capsules in the morning and evening, one capsule at midday 05/07/17   York Spaniel, MD  Menthol, Topical Analgesic, 3.7 % GEL Apply topically as needed.    [provider]  montelukast (SINGULAIR) 10 MG tablet Take 10 mg by mouth at bedtime.    [provider]  OVER THE COUNTER MEDICATION I B Guard    [provider]    Family History Family  History  Problem Relation Age of Onset  . Cancer Mother   . Other Mother   . COPD Father   . Asthma Brother   . Cancer Brother        colon  . Heart attack Brother   . Seizures Brother     Social History Social History  Substance Use Topics  . Smoking status: Never Smoker  . Smokeless tobacco: Never Used     Comment: Never Used Tobacco  . Alcohol use 0.0 oz/week     Comment: Occasional     Allergies   Ibuprofen; Tylenol [acetaminophen]; Lemon flavor; and Amoxicillin   Review of Systems Review of Systems  Constitutional: Negative for chills and fever.  HENT: Positive for congestion, rhinorrhea, sinus pain, sinus pressure and voice change (Hoarseness). Negative  for sore throat.   Respiratory: Positive for choking. Negative for chest tightness and shortness of breath.   Gastrointestinal: Negative for abdominal pain, constipation, diarrhea, nausea and vomiting.     Physical Exam Updated Vital Signs BP 108/73   Pulse (!) 58   Temp 98.6 F (37 C) (Oral)   Resp 18   LMP 07/29/2012   SpO2 99%   Physical Exam  Constitutional: She is oriented to person, place, and time. She appears well-developed and well-nourished. No distress.  HENT:  Head: Normocephalic and atraumatic.  Right Ear: Tympanic membrane, external ear and ear canal normal.  Left Ear: Tympanic membrane, external ear and ear canal normal.  Nose: Mucosal edema and rhinorrhea present. Right sinus exhibits maxillary sinus tenderness. Right sinus exhibits no frontal sinus tenderness. Left sinus exhibits maxillary sinus tenderness. Left sinus exhibits no frontal sinus tenderness.  Mouth/Throat: Oropharynx is clear and moist and mucous membranes are normal. No tonsillar exudate.  Eyes: Pupils are equal, round, and reactive to light. Conjunctivae and EOM are normal. Right eye exhibits no discharge. Left eye exhibits no discharge.  Neck: Normal range of motion. Neck supple.  Cardiovascular: Normal rate, regular  rhythm and intact distal pulses.   Pulmonary/Chest: Effort normal and breath sounds normal. No respiratory distress. She has no wheezes. She exhibits tenderness (Tenderness to palpation of bilateral chest musculature.).  Clear lung fields in all fields without adventitious sounds. Speaking in full sentences.   Abdominal: Soft. Bowel sounds are normal. She exhibits no distension. There is no tenderness. There is no guarding.  Musculoskeletal: Normal range of motion.  Lymphadenopathy:    She has cervical adenopathy.  Neurological: She is alert and oriented to person, place, and time.  Skin: Skin is warm and dry.  Psychiatric: She has a normal mood and affect.  Nursing note and vitals reviewed.    ED Treatments / Results  Labs (all labs ordered are listed, but only abnormal results are displayed) Labs Reviewed - No data to display  EKG  EKG Interpretation None       Radiology Dg Chest 2 View  Result Date: 09/06/2017 CLINICAL DATA:  Productive cough for 1 week, initial encounter EXAM: CHEST  2 VIEW COMPARISON:  12/21/16 FINDINGS: Cardiac shadow is within normal limits. The lungs are well aerated bilaterally. Hiatal hernia is again seen and stable in the left retrocardiac region. No focal infiltrate is seen. No bony abnormality is noted. IMPRESSION: No acute abnormality noted. Electronically Signed   By: Alcide Clever M.D.   On: 09/06/2017 11:18    Procedures Procedures (including critical care time)  Medications Ordered in ED Medications - No data to display   Initial Impression / Assessment and Plan / ED Course  I have reviewed the triage vital signs and the nursing notes.  Pertinent labs & imaging results that were available during my care of the patient were reviewed by me and considered in my medical decision making (see chart for details).     Pt with 2 wks of cough, congestion, and sinus pressure. Physical exam shows clear lung exam, pt is afebrile and not tachycardic.  Pain with maxillary sinus palpation. Pt has been trying to manage sxs with symptomatic tx, but states sxs are worsening. CXR negative for infiltrate or signs of PNA. Likely sinusitis, and as sxs have been >2 wks, will rx abx. Pt allergic to PCNs, will give doxycycline. Pt to continue flonase and hydration. F/u with PCP. Pt appears safe for discharge. Return precautions given.  Pt states she understands and agrees to plan.   Final Clinical Impressions(s) / ED Diagnoses   Final diagnoses:  Acute maxillary sinusitis, recurrence not specified    New Prescriptions Discharge Medication List as of 09/06/2017 11:45 AM    START taking these medications   Details  doxycycline (VIBRAMYCIN) 100 MG capsule Take 1 capsule (100 mg total) by mouth 2 (two) times daily., Starting Thu 09/06/2017, Print         Almond, Lional Icenogle, PA-C 09/06/17 2133    Loren Racer, MD 09/07/17 [phone]

## 2017-09-06 NOTE — ED Triage Notes (Signed)
Patient complains of 2 weeks of cough and congestion. Here requesting cxr. VSS pain with inspiration

## 2017-09-06 NOTE — Discharge Instructions (Signed)
Take doxycycline as prescribed. Take this with meals. Continue to take Flonase and Zyrtec daily. Use Tessalon Perles as needed for cough. It is very important that she stay well-hydrated with water. Follow-up with her primary care doctor in 5 days if symptoms are not improving. Return to the emergency room if you develop worsening symptoms after being on antibiotics for more than 48 hours. Return to the emergency room if you develop any new, worsening, or concerning symptoms.

## 2017-09-12 ENCOUNTER — Other Ambulatory Visit: Payer: Self-pay | Admitting: Neurology

## 2017-09-12 NOTE — Telephone Encounter (Signed)
Faxed printed/signed rx diazepam to [facility] at [phone]. Received confirmation.

## 2017-09-25 ENCOUNTER — Ambulatory Visit (INDEPENDENT_AMBULATORY_CARE_PROVIDER_SITE_OTHER): Payer: Medicare Other | Admitting: Adult Health

## 2017-09-25 ENCOUNTER — Encounter: Payer: Self-pay | Admitting: Adult Health

## 2017-09-25 VITALS — BP 123/80 | HR 56 | Ht 63.0 in | Wt 147.4 lb

## 2017-09-25 DIAGNOSIS — G2582 Stiff-man syndrome: Secondary | ICD-10-CM

## 2017-09-25 MED ORDER — BACLOFEN 20 MG PO TABS: ORAL_TABLET | ORAL | 3 refills | Status: DC

## 2017-09-25 MED ORDER — GABAPENTIN 300 MG PO CAPS: ORAL_CAPSULE | ORAL | 3 refills | Status: DC

## 2017-09-25 NOTE — Patient Instructions (Signed)
Your Plan:  Continue gabapentin, diazepam and baclofen If your symptoms worsen or you develop new symptoms please let us know.   Thank you for coming to see [place] at [facility]. I hope we have been able to provide you high quality care today.  You may receive a patient satisfaction survey over the next few weeks. We would appreciate your feedback and comments so that we may continue to improve ourselves and the health of our patients.

## 2017-09-25 NOTE — Progress Notes (Signed)
I have read the note, and I agree with the clinical assessment and plan.  Dorotea Hand KEITH   

## 2017-09-25 NOTE — Progress Notes (Signed)
PATIENT: Sherri [name] DOB: [birth year]  REASON FOR VISIT: follow up HISTORY FROM: patient  HISTORY OF PRESENT ILLNESS: Today 09/25/17 Sherri [name] is a 51 year old female with a history of stiff person syndrome. She returns today for follow-up. She is currently on gabapentin, diazepam and baclofen. She reports that this is working well for her. She states that she had 2 falls in the office. She states that the first fall she tripped over something and hip her lip. She states the second time she became stiff and fell forward. Unfortunately the second fall sh bit through her lip. She did not go to urgent care right away but went the next day -she did not require stitches. she reports that her lip is still healing. she reports that she would like to get her license back. reports that they were revoked after she was charged with a dUI due to her medications that she is on. she returns today for an evaluation.  HISTORY 01/18/17: [name] is a 51 year old right-handed black female with a history of stiff person syndrome. The patient went into the hospital around 01/01/2017 with severe muscle spasms involving the back and difficulty with walking. The patient was treated for several days, the spasms have improved, she still have some stiffness of the left hip and thigh area. The patient is using a walker for ambulation outside the house, she denies any falls. The patient underwent MRI evaluation of the brain in the hospital as she had bumped her head, this was unremarkable. The patient is now on baclofen, diazepam, and gabapentin. She has a history of cocaine abuse, but her most recent urine drug screen during the January hospitalization was negative for cocaine.  REVIEW OF SYSTEMS: Out of a complete 14 system review of symptoms, the patient complains only of the following symptoms, and all other reviewed systems are negative.  Back pain, environmental allergies  ALLERGIES: Allergies  Allergen  Reactions  . Ibuprofen Other (See Comments)    Does not take due to hx of renal insufficiency  . Tylenol [Acetaminophen] Hives    Cannot take large quantities  . Lemon Flavor Swelling    Severe Lip Swelling  FRUIT per pt.    . Amoxicillin Diarrhea    HOME MEDICATIONS: Outpatient Medications Prior to Visit  Medication Sig Dispense Refill  . albuterol (PROVENTIL HFA;VENTOLIN HFA) 108 (90 BASE) MCG/ACT inhaler Inhale 1 puff into the lungs every 6 (six) hours as needed. PRO AIR    . benzocaine (BABY ORAJEL) 7.5 % oral gel Use as directed in the mouth or throat 3 (three) times daily as needed for pain. 9.45 g 0  . cetirizine (ZYRTEC ALLERGY) 10 MG tablet Take 1 tablet (10 mg total) by mouth daily. 30 tablet 0  . Cholecalciferol (VITAMIN D3) 5000 units CAPS Take by mouth.    . cyanocobalamin (,VITAMIN B-12,) 1000 MCG/ML injection Inject 1,000 mcg into the muscle every 3 (three) months.     . desloratadine (CLARINEX) 5 MG tablet Take 1 tablet (5 mg total) by mouth daily. 30 tablet 0  . diazepam (VALIUM) 10 MG tablet TAKE 2 TABLETS BY MOUTH 3 TIMES A DAY 180 tablet 3  . diphenhydrAMINE (BENADRYL) 25 MG tablet Take 25 mg by mouth at bedtime as needed for itching or allergies.     [name] EPIPEN 2-PAK 0.3 MG/0.3ML SOAJ injection USE AS DIRECTED FOR  SEVERE  ALLERGIC  REACTIONS 1 Device 0  . FLUoxetine (PROZAC) 10 MG capsule Take 1 capsule (10  mg total) by mouth daily. 90 capsule 3  . fluticasone (FLONASE) 50 MCG/ACT nasal spray Place 2 sprays into both nostrils daily. 1 g 0  . Fluticasone-Salmeterol (ADVAIR) 250-50 MCG/DOSE AEPB Inhale 1 puff into the lungs 2 (two) times daily.     . Menthol, Topical Analgesic, 3.7 % GEL Apply topically as needed.    . montelukast (SINGULAIR) 10 MG tablet Take 10 mg by mouth at bedtime.    [name] OVER THE COUNTER MEDICATION I B Guard    . baclofen (LIORESAL) 20 MG tablet TAKE ONE & ONE-HALF TABLETS BY MOUTH IN THE MORNING AND IN THE EVENING AND ONE AT  MIDDAY 360 tablet 1    . gabapentin (NEURONTIN) 300 MG capsule 2 capsules in the morning and evening, one capsule at midday 450 capsule 2  . benzonatate (TESSALON) 100 MG capsule Take 1 capsule (100 mg total) by mouth every 8 (eight) hours. (Patient not taking: Reported on 09/25/2017) 21 capsule 0  . doxycycline (VIBRAMYCIN) 100 MG capsule Take 1 capsule (100 mg total) by mouth 2 (two) times daily. (Patient not taking: Reported on 09/25/2017) 20 capsule 0   No facility-administered medications prior to visit.     PAST MEDICAL HISTORY: Past Medical History:  Diagnosis Date  . Angioedema   . Anxiety   . Arthritis   . Asthma   . CAP (community acquired pneumonia) 08/29/2012  . Chronic back pain   . Chronic kidney disease   . Cocaine abuse (HCC)   . Depression   . Diarrhea   . DVT (deep venous thrombosis) (HCC)   . Environmental allergies   . Fall   . GERD (gastroesophageal reflux disease)   . Heart murmur    has been told once that she has a heart murmur, but has never had any problems  . Hiatal hernia 03/21/2013  . Lumbar herniated disc   . Peripheral vascular disease (HCC)   . Pernicious anemia 10/30/2011  . Postconcussion syndrome 12/30/2014  . Seasonal allergies   . Stiff person syndrome   . Urticaria     PAST SURGICAL HISTORY: Past Surgical History:  Procedure Laterality Date  . BUNIONECTOMY Left   . colonoscopy  07/09/15  . inguinal hernia 1983  1983  . OPEN REDUCTION INTERNAL FIXATION (ORIF) DISTAL RADIAL FRACTURE Right 08/16/2016   Procedure: OPEN REDUCTION INTERNAL FIXATION (ORIF) DISTAL RADIAL FRACTURE;  Surgeon: Eldred Manges, MD;  Location: [facility] OR;  Service: Orthopedics;  Laterality: Right;    FAMILY HISTORY: Family History  Problem Relation Age of Onset  . Cancer Mother   . Other Mother   . COPD Father   . Asthma Brother   . Cancer Brother        colon  . Heart attack Brother   . Seizures Brother     SOCIAL HISTORY: Social History   Social History  . Marital status: Divorced     Spouse name: N/A  . Number of children: 1  . Years of education: college   Occupational History  . disabled    Social History Main Topics  . Smoking status: Never Smoker  . Smokeless tobacco: Never Used     Comment: Never Used Tobacco  . Alcohol use 0.0 oz/week     Comment: Occasional  . Drug use: Yes    Types: Cocaine     Comment: in the last two weeks as of 08/08/17  . Sexual activity: Not on file   Other Topics Concern  . Not on  file   Social History Narrative   Patient is right and left handed.   Patient drinks some caffeine occasionally.         PHYSICAL EXAM  Vitals:   09/25/17 1252  BP: 123/80  Pulse: (!) 56  Weight: 147 lb 6.4 oz (66.9 kg)  Height:  (1.6 m)   Body mass index is 26.11 kg/m.  Generalized: Well developed, in no acute distress   Neurological examination  Mentation: Alert oriented to time, place, history taking. Follows all commands speech and language fluent Cranial nerve II-XII: Pupils were equal round reactive to light. Extraocular movements were full, visual field were full on confrontational test. Facial sensation and strength were normal. Uvula tongue midline. Head turning and shoulder shrug  were normal and symmetric. Motor: The motor testing reveals 5 over 5 strength of all 4 extremities. Good symmetric motor tone is noted throughout.  Sensory: Sensory testing is intact to soft touch on all 4 extremities. No evidence of extinction is noted.  Coordination: Cerebellar testing reveals good finger-nose-finger and heel-to-shin bilaterally.  Gait and station: Gait is wide-based. She uses a cane however she tends to hold the cane rather than walking with it. Tandem gait not attempted   DIAGNOSTIC DATA (LABS, IMAGING, TESTING) - I reviewed patient records, labs, notes, testing and imaging myself where available.  Lab Results  Component Value Date   WBC 6.2 07/23/2017   HGB 11.3 (L) 07/23/2017   HCT 34.2 (L) 07/23/2017   MCV 88  07/23/2017   PLT 254 07/23/2017      Component Value Date/Time   NA 139 07/23/2017 1130   NA 144 03/22/2017 0943   K 3.8 07/23/2017 1130   K 4.2 03/22/2017 0943   CL 108 07/23/2017 1130   CO2 31 07/23/2017 1130   CO2 29 03/22/2017 0943   GLUCOSE 145 (H) 07/23/2017 1130   BUN 9 07/23/2017 1130   BUN 11.1 03/22/2017 0943   CREATININE 1.0 07/23/2017 1130   CREATININE 1.0 03/22/2017 0943   CALCIUM 9.4 07/23/2017 1130   CALCIUM 9.9 03/22/2017 0943   PROT 6.9 07/23/2017 1130   PROT 7.1 03/22/2017 0943   ALBUMIN 3.3 07/23/2017 1130   ALBUMIN 3.8 03/22/2017 0943   AST 22 07/23/2017 1130   AST 18 03/22/2017 0943   ALT 21 07/23/2017 1130   ALT 23 03/22/2017 0943   ALKPHOS 92 (H) 07/23/2017 1130   ALKPHOS 114 03/22/2017 0943   BILITOT 0.50 07/23/2017 1130   BILITOT 0.24 03/22/2017 0943   GFRNONAA 52 (L) 01/03/2017 0740   GFRAA 60 (L) 01/03/2017 0740   No results found for: CHOL, HDL, LDLCALC, LDLDIRECT, TRIG, CHOLHDL Lab Results  Component Value Date   HGBA1C 5.3 09/03/2012   Lab Results  Component Value Date   VITAMINB12 358 07/23/2017   Lab Results  Component Value Date   TSH 1.176 12/14/2015      ASSESSMENT AND PLAN 51 y.o. year old female  has a past medical history of Angioedema; Anxiety; Arthritis; Asthma; CAP (community acquired pneumonia) (08/29/2012); Chronic back pain; Chronic kidney disease; Cocaine abuse (HCC); Depression; Diarrhea; DVT (deep venous thrombosis) (HCC); Environmental allergies; Fall; GERD (gastroesophageal reflux disease); Heart murmur; Hiatal hernia (03/21/2013); Lumbar herniated disc; Peripheral vascular disease (HCC); Pernicious anemia (10/30/2011); Postconcussion syndrome (12/30/2014); Seasonal allergies; Stiff person syndrome; and Urticaria. here with:  1. Stiff person syndrome  Overall the patient has remained stable. She will continue on gabapentin, diazepam and baclofen. She will continue on these medications at  this time. She is requesting to  get her driver's license and will be bringing paperwork by our office. I advised the patient that this will be Dr. Clarisa Kindred discretion. She voiced understanding. She will follow-up in 6 months or sooner if needed.  I spent 15 minutes with the patient. 50% of this time was spent discussing her medication.    Butch Penny, MSN, NP-C 09/25/2017, 1:31 PM [facility] [address], [address] [place], [place] [address] [phone]

## 2017-09-27 IMAGING — CR DG CHEST 2V
2 series · 2 of 2 positions shown · non-contrast
Comparison: 11/19/2015

CLINICAL DATA: Shortness of breath, dizziness

EXAM:
CHEST  2 VIEW

[chest lat]
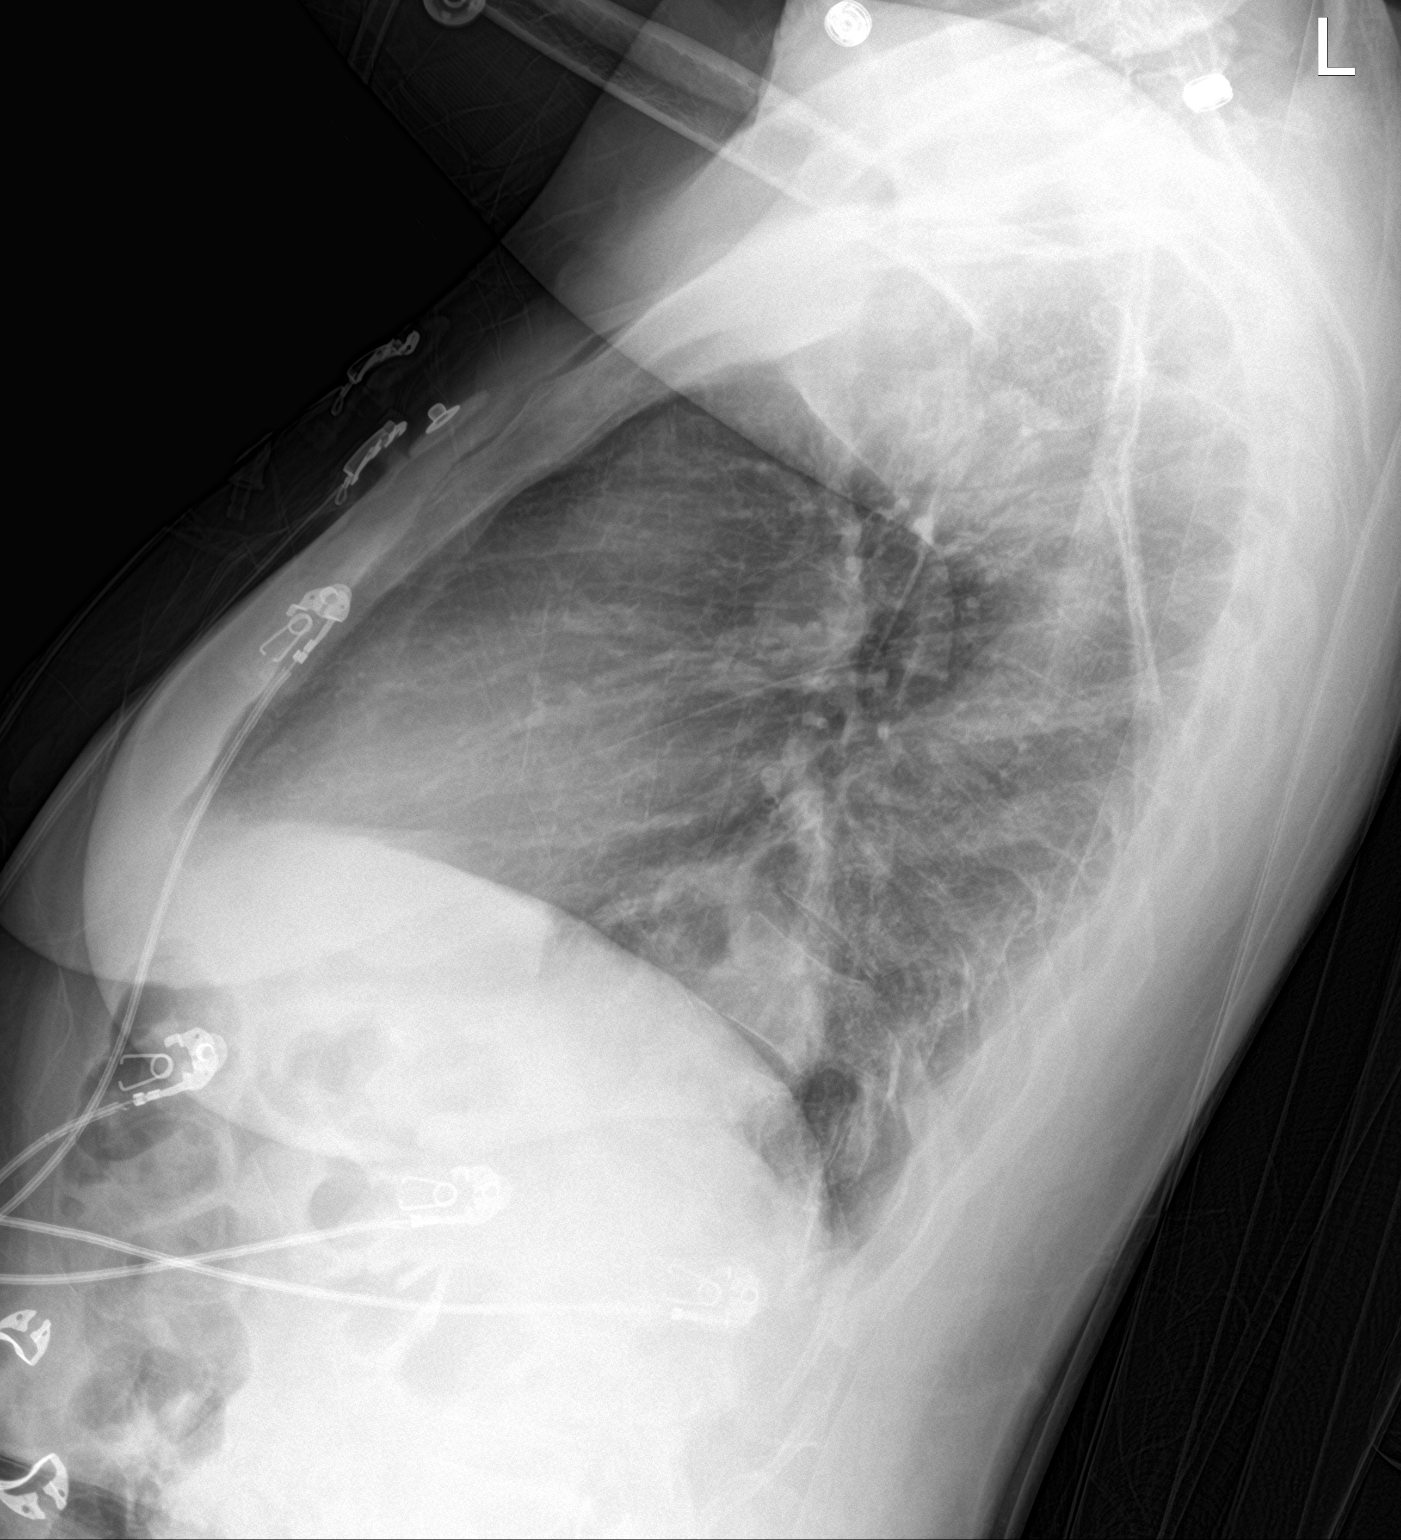

[chest ap]
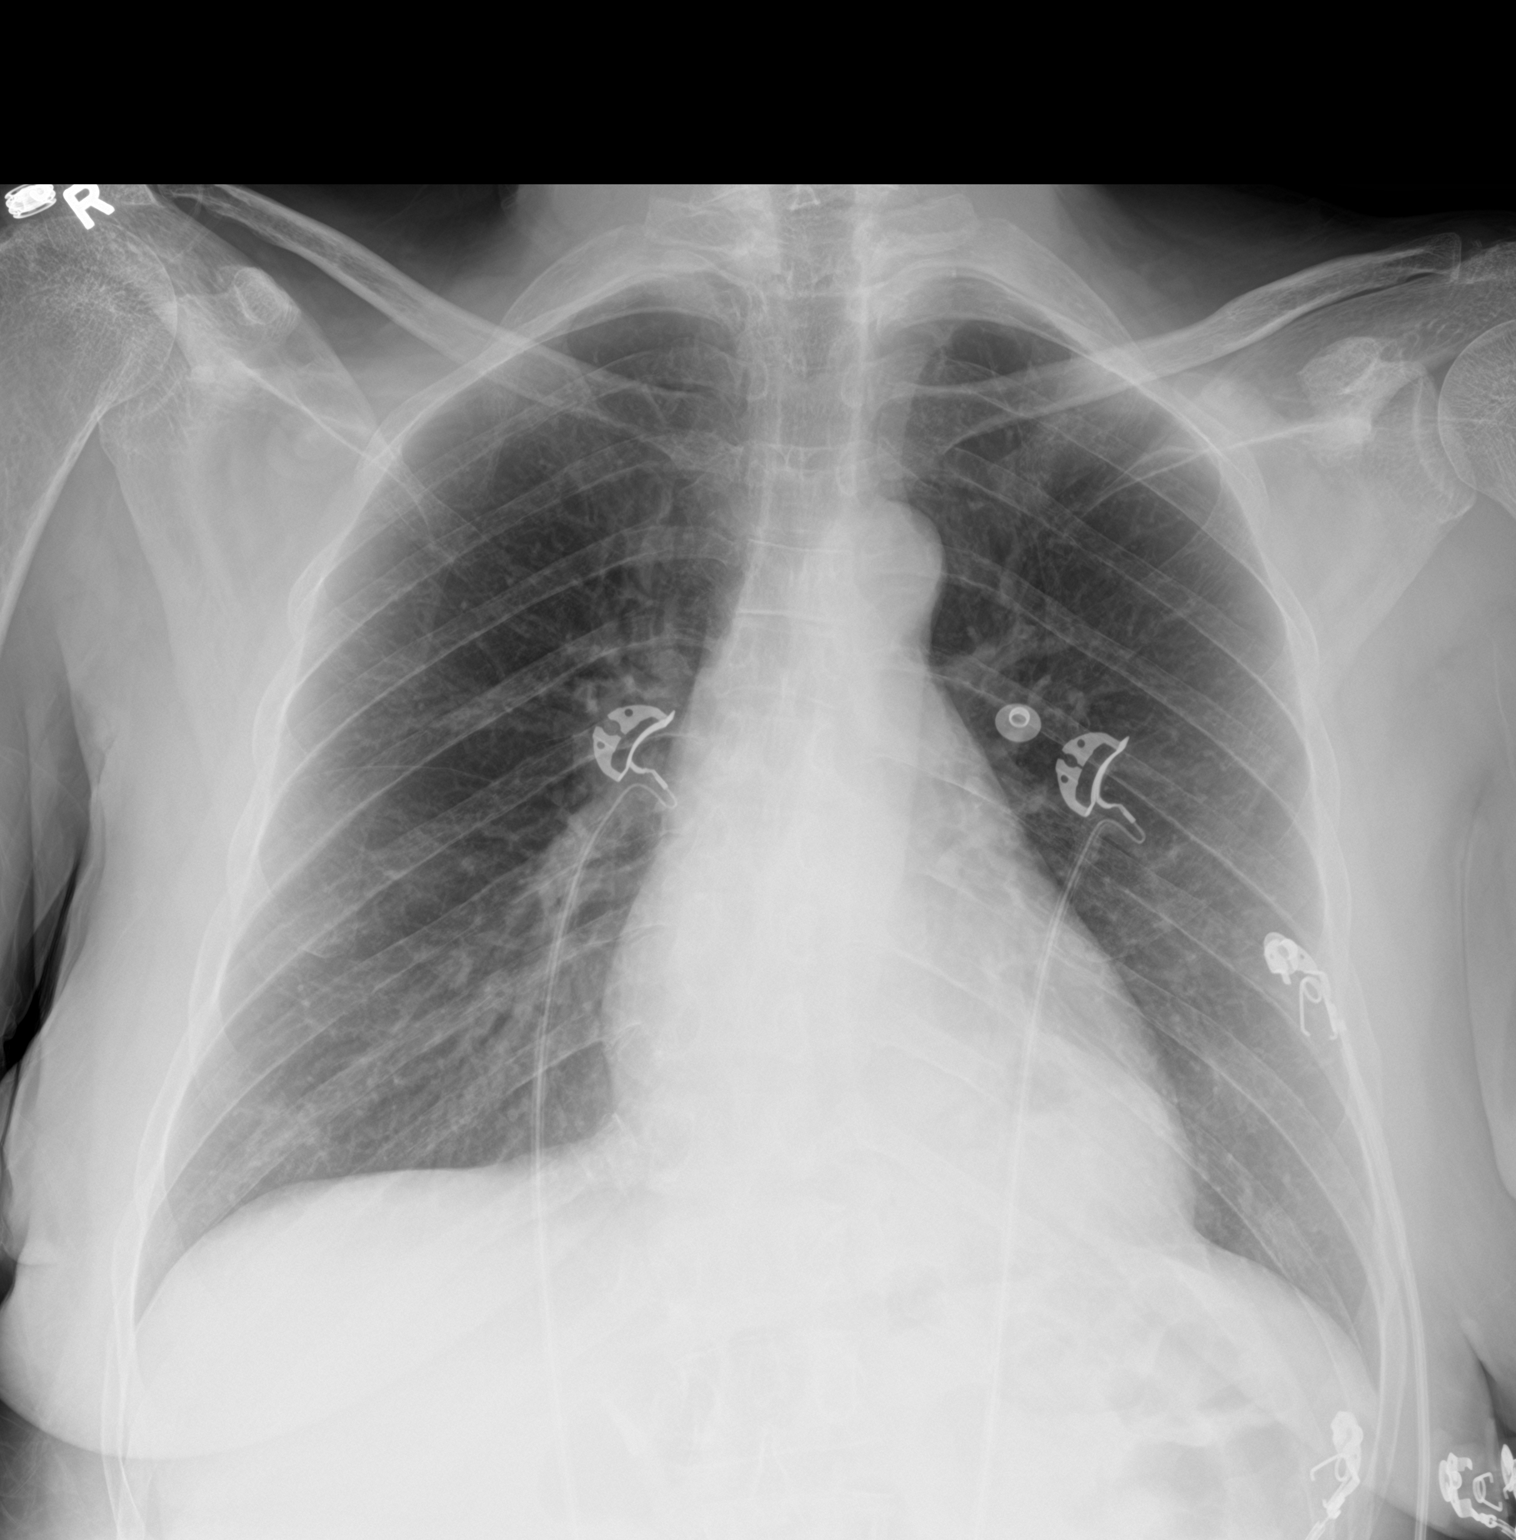

[2 of 2 positions shown; findings below may reference images not displayed]

FINDINGS: Moderate-sized hiatal hernia. Heart is normal size. No confluent
airspace opacities or effusions. No acute bony abnormality.
IMPRESSION: Moderate-sized hiatal hernia.  No active disease.

## 2017-10-01 ENCOUNTER — Telehealth: Payer: Self-pay | Admitting: *Deleted

## 2017-10-01 NOTE — Telephone Encounter (Signed)
Gave completed/signed DMV form by CW,MD back to medical records to process for patient.

## 2017-10-05 DIAGNOSIS — Z113 Encounter for screening for infections with a predominantly sexual mode of transmission: Secondary | ICD-10-CM | POA: Diagnosis not present

## 2017-10-05 DIAGNOSIS — G2582 Stiff-man syndrome: Secondary | ICD-10-CM | POA: Diagnosis not present

## 2017-10-05 DIAGNOSIS — Z1239 Encounter for other screening for malignant neoplasm of breast: Secondary | ICD-10-CM | POA: Diagnosis not present

## 2017-10-05 DIAGNOSIS — E559 Vitamin D deficiency, unspecified: Secondary | ICD-10-CM | POA: Diagnosis not present

## 2017-10-05 DIAGNOSIS — R7309 Other abnormal glucose: Secondary | ICD-10-CM | POA: Diagnosis not present

## 2017-10-05 DIAGNOSIS — E785 Hyperlipidemia, unspecified: Secondary | ICD-10-CM | POA: Diagnosis not present

## 2017-10-05 DIAGNOSIS — M545 Low back pain: Secondary | ICD-10-CM | POA: Diagnosis not present

## 2017-10-05 DIAGNOSIS — Z0000 Encounter for general adult medical examination without abnormal findings: Secondary | ICD-10-CM | POA: Diagnosis not present

## 2017-10-05 DIAGNOSIS — Z124 Encounter for screening for malignant neoplasm of cervix: Secondary | ICD-10-CM | POA: Diagnosis not present

## 2017-10-05 DIAGNOSIS — Z01419 Encounter for gynecological examination (general) (routine) without abnormal findings: Secondary | ICD-10-CM | POA: Diagnosis not present

## 2017-10-05 DIAGNOSIS — R296 Repeated falls: Secondary | ICD-10-CM | POA: Diagnosis not present

## 2017-10-30 DIAGNOSIS — K219 Gastro-esophageal reflux disease without esophagitis: Secondary | ICD-10-CM | POA: Diagnosis not present

## 2017-10-30 DIAGNOSIS — M545 Low back pain: Secondary | ICD-10-CM | POA: Diagnosis not present

## 2017-10-30 DIAGNOSIS — F419 Anxiety disorder, unspecified: Secondary | ICD-10-CM | POA: Diagnosis not present

## 2017-10-30 DIAGNOSIS — G2582 Stiff-man syndrome: Secondary | ICD-10-CM | POA: Diagnosis not present

## 2017-10-30 DIAGNOSIS — Z9181 History of falling: Secondary | ICD-10-CM | POA: Diagnosis not present

## 2017-10-30 DIAGNOSIS — Z7951 Long term (current) use of inhaled steroids: Secondary | ICD-10-CM | POA: Diagnosis not present

## 2017-10-30 DIAGNOSIS — M6281 Muscle weakness (generalized): Secondary | ICD-10-CM | POA: Diagnosis not present

## 2017-10-30 DIAGNOSIS — E559 Vitamin D deficiency, unspecified: Secondary | ICD-10-CM | POA: Diagnosis not present

## 2017-10-30 DIAGNOSIS — I129 Hypertensive chronic kidney disease with stage 1 through stage 4 chronic kidney disease, or unspecified chronic kidney disease: Secondary | ICD-10-CM | POA: Diagnosis not present

## 2017-10-30 DIAGNOSIS — N182 Chronic kidney disease, stage 2 (mild): Secondary | ICD-10-CM | POA: Diagnosis not present

## 2017-10-30 DIAGNOSIS — J4520 Mild intermittent asthma, uncomplicated: Secondary | ICD-10-CM | POA: Diagnosis not present

## 2017-11-01 DIAGNOSIS — G2582 Stiff-man syndrome: Secondary | ICD-10-CM | POA: Diagnosis not present

## 2017-11-01 DIAGNOSIS — I129 Hypertensive chronic kidney disease with stage 1 through stage 4 chronic kidney disease, or unspecified chronic kidney disease: Secondary | ICD-10-CM | POA: Diagnosis not present

## 2017-11-01 DIAGNOSIS — N182 Chronic kidney disease, stage 2 (mild): Secondary | ICD-10-CM | POA: Diagnosis not present

## 2017-11-01 DIAGNOSIS — M545 Low back pain: Secondary | ICD-10-CM | POA: Diagnosis not present

## 2017-11-01 DIAGNOSIS — J4520 Mild intermittent asthma, uncomplicated: Secondary | ICD-10-CM | POA: Diagnosis not present

## 2017-11-01 DIAGNOSIS — M6281 Muscle weakness (generalized): Secondary | ICD-10-CM | POA: Diagnosis not present

## 2017-11-08 DIAGNOSIS — M545 Low back pain: Secondary | ICD-10-CM | POA: Diagnosis not present

## 2017-11-08 DIAGNOSIS — G2582 Stiff-man syndrome: Secondary | ICD-10-CM | POA: Diagnosis not present

## 2017-11-08 DIAGNOSIS — J4520 Mild intermittent asthma, uncomplicated: Secondary | ICD-10-CM | POA: Diagnosis not present

## 2017-11-08 DIAGNOSIS — N182 Chronic kidney disease, stage 2 (mild): Secondary | ICD-10-CM | POA: Diagnosis not present

## 2017-11-08 DIAGNOSIS — M6281 Muscle weakness (generalized): Secondary | ICD-10-CM | POA: Diagnosis not present

## 2017-11-08 DIAGNOSIS — I129 Hypertensive chronic kidney disease with stage 1 through stage 4 chronic kidney disease, or unspecified chronic kidney disease: Secondary | ICD-10-CM | POA: Diagnosis not present

## 2017-11-09 DIAGNOSIS — G2582 Stiff-man syndrome: Secondary | ICD-10-CM | POA: Diagnosis not present

## 2017-11-09 DIAGNOSIS — J4520 Mild intermittent asthma, uncomplicated: Secondary | ICD-10-CM | POA: Diagnosis not present

## 2017-11-09 DIAGNOSIS — N182 Chronic kidney disease, stage 2 (mild): Secondary | ICD-10-CM | POA: Diagnosis not present

## 2017-11-09 DIAGNOSIS — M545 Low back pain: Secondary | ICD-10-CM | POA: Diagnosis not present

## 2017-11-09 DIAGNOSIS — I129 Hypertensive chronic kidney disease with stage 1 through stage 4 chronic kidney disease, or unspecified chronic kidney disease: Secondary | ICD-10-CM | POA: Diagnosis not present

## 2017-11-09 DIAGNOSIS — M6281 Muscle weakness (generalized): Secondary | ICD-10-CM | POA: Diagnosis not present

## 2017-11-13 DIAGNOSIS — G2582 Stiff-man syndrome: Secondary | ICD-10-CM | POA: Diagnosis not present

## 2017-11-13 DIAGNOSIS — N182 Chronic kidney disease, stage 2 (mild): Secondary | ICD-10-CM | POA: Diagnosis not present

## 2017-11-13 DIAGNOSIS — M6281 Muscle weakness (generalized): Secondary | ICD-10-CM | POA: Diagnosis not present

## 2017-11-13 DIAGNOSIS — M545 Low back pain: Secondary | ICD-10-CM | POA: Diagnosis not present

## 2017-11-13 DIAGNOSIS — J4520 Mild intermittent asthma, uncomplicated: Secondary | ICD-10-CM | POA: Diagnosis not present

## 2017-11-13 DIAGNOSIS — I129 Hypertensive chronic kidney disease with stage 1 through stage 4 chronic kidney disease, or unspecified chronic kidney disease: Secondary | ICD-10-CM | POA: Diagnosis not present

## 2017-11-14 DIAGNOSIS — J4520 Mild intermittent asthma, uncomplicated: Secondary | ICD-10-CM | POA: Diagnosis not present

## 2017-11-14 DIAGNOSIS — N182 Chronic kidney disease, stage 2 (mild): Secondary | ICD-10-CM | POA: Diagnosis not present

## 2017-11-14 DIAGNOSIS — G2582 Stiff-man syndrome: Secondary | ICD-10-CM | POA: Diagnosis not present

## 2017-11-14 DIAGNOSIS — I129 Hypertensive chronic kidney disease with stage 1 through stage 4 chronic kidney disease, or unspecified chronic kidney disease: Secondary | ICD-10-CM | POA: Diagnosis not present

## 2017-11-14 DIAGNOSIS — M6281 Muscle weakness (generalized): Secondary | ICD-10-CM | POA: Diagnosis not present

## 2017-11-14 DIAGNOSIS — M545 Low back pain: Secondary | ICD-10-CM | POA: Diagnosis not present

## 2017-11-19 ENCOUNTER — Other Ambulatory Visit: Payer: Self-pay | Admitting: *Deleted

## 2017-11-19 DIAGNOSIS — M6281 Muscle weakness (generalized): Secondary | ICD-10-CM | POA: Diagnosis not present

## 2017-11-19 DIAGNOSIS — M545 Low back pain: Secondary | ICD-10-CM | POA: Diagnosis not present

## 2017-11-19 DIAGNOSIS — I129 Hypertensive chronic kidney disease with stage 1 through stage 4 chronic kidney disease, or unspecified chronic kidney disease: Secondary | ICD-10-CM | POA: Diagnosis not present

## 2017-11-19 DIAGNOSIS — J4520 Mild intermittent asthma, uncomplicated: Secondary | ICD-10-CM | POA: Diagnosis not present

## 2017-11-19 DIAGNOSIS — G2582 Stiff-man syndrome: Secondary | ICD-10-CM | POA: Diagnosis not present

## 2017-11-19 DIAGNOSIS — N182 Chronic kidney disease, stage 2 (mild): Secondary | ICD-10-CM | POA: Diagnosis not present

## 2017-11-19 MED ORDER — FLUOXETINE HCL 10 MG PO CAPS: 10.0000 mg | ORAL_CAPSULE | Freq: Every day | ORAL | 2 refills | Status: DC

## 2017-11-19 NOTE — Telephone Encounter (Signed)
Refilled to [facility] at patient's request.

## 2017-11-22 DIAGNOSIS — N182 Chronic kidney disease, stage 2 (mild): Secondary | ICD-10-CM | POA: Diagnosis not present

## 2017-11-22 DIAGNOSIS — M545 Low back pain: Secondary | ICD-10-CM | POA: Diagnosis not present

## 2017-11-22 DIAGNOSIS — M6281 Muscle weakness (generalized): Secondary | ICD-10-CM | POA: Diagnosis not present

## 2017-11-22 DIAGNOSIS — I129 Hypertensive chronic kidney disease with stage 1 through stage 4 chronic kidney disease, or unspecified chronic kidney disease: Secondary | ICD-10-CM | POA: Diagnosis not present

## 2017-11-22 DIAGNOSIS — J4520 Mild intermittent asthma, uncomplicated: Secondary | ICD-10-CM | POA: Diagnosis not present

## 2017-11-22 DIAGNOSIS — G2582 Stiff-man syndrome: Secondary | ICD-10-CM | POA: Diagnosis not present

## 2017-11-23 ENCOUNTER — Ambulatory Visit (HOSPITAL_BASED_OUTPATIENT_CLINIC_OR_DEPARTMENT_OTHER): Payer: Medicare Other | Admitting: Hematology & Oncology

## 2017-11-23 ENCOUNTER — Ambulatory Visit: Payer: Medicare Other

## 2017-11-23 ENCOUNTER — Emergency Department (HOSPITAL_BASED_OUTPATIENT_CLINIC_OR_DEPARTMENT_OTHER)
Admission: EM | Admit: 2017-11-23 | Discharge: 2017-11-23 | Disposition: A | Discharge status: Home / Self Care | Payer: Medicare Other | Attending: Emergency Medicine | Admitting: Emergency Medicine

## 2017-11-23 ENCOUNTER — Other Ambulatory Visit: Payer: Self-pay

## 2017-11-23 ENCOUNTER — Other Ambulatory Visit (HOSPITAL_BASED_OUTPATIENT_CLINIC_OR_DEPARTMENT_OTHER): Payer: Medicare Other

## 2017-11-23 ENCOUNTER — Encounter (HOSPITAL_BASED_OUTPATIENT_CLINIC_OR_DEPARTMENT_OTHER): Payer: Self-pay | Admitting: *Deleted

## 2017-11-23 ENCOUNTER — Encounter: Payer: Self-pay | Admitting: Hematology & Oncology

## 2017-11-23 VITALS — BP 119/80 | HR 112 | Temp 98.3°F | Resp 16 | Wt 155.0 lb

## 2017-11-23 DIAGNOSIS — E119 Type 2 diabetes mellitus without complications: Secondary | ICD-10-CM

## 2017-11-23 DIAGNOSIS — E1165 Type 2 diabetes mellitus with hyperglycemia: Secondary | ICD-10-CM | POA: Insufficient documentation

## 2017-11-23 DIAGNOSIS — R42 Dizziness and giddiness: Secondary | ICD-10-CM | POA: Diagnosis not present

## 2017-11-23 DIAGNOSIS — J45909 Unspecified asthma, uncomplicated: Secondary | ICD-10-CM | POA: Insufficient documentation

## 2017-11-23 DIAGNOSIS — E1122 Type 2 diabetes mellitus with diabetic chronic kidney disease: Secondary | ICD-10-CM | POA: Diagnosis not present

## 2017-11-23 DIAGNOSIS — Z79899 Other long term (current) drug therapy: Secondary | ICD-10-CM | POA: Insufficient documentation

## 2017-11-23 DIAGNOSIS — D510 Vitamin B12 deficiency anemia due to intrinsic factor deficiency: Secondary | ICD-10-CM | POA: Diagnosis not present

## 2017-11-23 DIAGNOSIS — Z794 Long term (current) use of insulin: Secondary | ICD-10-CM | POA: Diagnosis not present

## 2017-11-23 DIAGNOSIS — R631 Polydipsia: Secondary | ICD-10-CM | POA: Diagnosis present

## 2017-11-23 DIAGNOSIS — N189 Chronic kidney disease, unspecified: Secondary | ICD-10-CM | POA: Diagnosis not present

## 2017-11-23 DIAGNOSIS — D509 Iron deficiency anemia, unspecified: Secondary | ICD-10-CM

## 2017-11-23 DIAGNOSIS — D500 Iron deficiency anemia secondary to blood loss (chronic): Secondary | ICD-10-CM | POA: Diagnosis not present

## 2017-11-23 DIAGNOSIS — Z86718 Personal history of other venous thrombosis and embolism: Secondary | ICD-10-CM

## 2017-11-23 DIAGNOSIS — G8929 Other chronic pain: Secondary | ICD-10-CM | POA: Insufficient documentation

## 2017-11-23 DIAGNOSIS — R739 Hyperglycemia, unspecified: Secondary | ICD-10-CM

## 2017-11-23 LAB — URINALYSIS, MICROSCOPIC (REFLEX)
Squamous Epithelial / LPF: NONE SEEN
WBC UA: NONE SEEN WBC/hpf (ref 0–5)

## 2017-11-23 LAB — BASIC METABOLIC PANEL
Anion gap: 9 (ref 5–15)
BUN: 13 mg/dL (ref 6–20)
CALCIUM: 9.8 mg/dL (ref 8.9–10.3)
CO2: 28 mmol/L (ref 22–32)
Chloride: 90 mmol/L — ABNORMAL LOW (ref 101–111)
Creatinine, Ser: 0.98 mg/dL (ref 0.44–1.00)
Glucose, Bld: 477 mg/dL — ABNORMAL HIGH (ref 65–99)
Potassium: 4.2 mmol/L (ref 3.5–5.1)
SODIUM: 127 mmol/L — AB (ref 135–145)

## 2017-11-23 LAB — CBC WITH DIFFERENTIAL (CANCER CENTER ONLY)
BASO#: 0 10*3/uL (ref 0.0–0.2)
BASO%: 0 % (ref 0.0–2.0)
EOS%: 0 % (ref 0.0–7.0)
Eosinophils Absolute: 0 10*3/uL (ref 0.0–0.5)
HCT: 36.7 % (ref 34.8–46.6)
HGB: 13.1 g/dL (ref 11.6–15.9)
LYMPH#: 1.4 10*3/uL (ref 0.9–3.3)
LYMPH%: 19.8 % (ref 14.0–48.0)
MCH: 28.9 pg (ref 26.0–34.0)
MCHC: 35.7 g/dL (ref 32.0–36.0)
MCV: 81 fL (ref 81–101)
MONO#: 0.4 10*3/uL (ref 0.1–0.9)
MONO%: 5.1 % (ref 0.0–13.0)
NEUT#: 5.2 10*3/uL (ref 1.5–6.5)
NEUT%: 75.1 % (ref 39.6–80.0)
Platelets: 233 10*3/uL (ref 145–400)
RBC: 4.54 10*6/uL (ref 3.70–5.32)
RDW: 11.2 % (ref 11.1–15.7)
WBC: 6.9 10*3/uL (ref 3.9–10.0)

## 2017-11-23 LAB — URINALYSIS, ROUTINE W REFLEX MICROSCOPIC
BILIRUBIN URINE: NEGATIVE
Glucose, UA: >=500 mg/dL — AB
KETONES UR: 15 mg/dL — AB
Leukocytes, UA: NEGATIVE
NITRITE: NEGATIVE
Protein, ur: NEGATIVE mg/dL
Specific Gravity, Urine: 1.01 (ref 1.005–1.030)
pH: 7.5 (ref 5.0–8.0)

## 2017-11-23 LAB — CBC WITH DIFFERENTIAL/PLATELET
BASOS PCT: 0 %
Basophils Absolute: 0 10*3/uL (ref 0.0–0.1)
EOS ABS: 0 10*3/uL (ref 0.0–0.7)
Eosinophils Relative: 0 %
HCT: 38.4 % (ref 36.0–46.0)
HEMOGLOBIN: 13.6 g/dL (ref 12.0–15.0)
Lymphocytes Relative: 23 %
Lymphs Abs: 1.8 10*3/uL (ref 0.7–4.0)
MCH: 28.3 pg (ref 26.0–34.0)
MCHC: 35.4 g/dL (ref 30.0–36.0)
MCV: 79.8 fL (ref 78.0–100.0)
Monocytes Absolute: 0.4 10*3/uL (ref 0.1–1.0)
Monocytes Relative: 5 %
NEUTROS PCT: 72 %
Neutro Abs: 5.7 10*3/uL (ref 1.7–7.7)
Platelets: 234 10*3/uL (ref 150–400)
RBC: 4.81 MIL/uL (ref 3.87–5.11)
RDW: 11.2 % — ABNORMAL LOW (ref 11.5–15.5)
WBC: 7.8 10*3/uL (ref 4.0–10.5)

## 2017-11-23 LAB — CMP (CANCER CENTER ONLY)
ALBUMIN: 4.4 g/dL (ref 3.3–5.5)
ALK PHOS: 174 U/L — AB (ref 26–84)
ALT: 46 U/L (ref 10–47)
AST: 23 U/L (ref 11–38)
BUN: 10 mg/dL (ref 7–22)
CO2: 30 mEq/L (ref 18–33)
Calcium: 10.1 mg/dL (ref 8.0–10.3)
Chloride: 94 mEq/L — ABNORMAL LOW (ref 98–108)
Creat: 1 mg/dl (ref 0.6–1.2)
Glucose, Bld: 531 mg/dL — ABNORMAL HIGH (ref 73–118)
POTASSIUM: 3.9 meq/L (ref 3.3–4.7)
Sodium: 136 mEq/L (ref 128–145)
TOTAL PROTEIN: 8.3 g/dL — AB (ref 6.4–8.1)
Total Bilirubin: 0.6 mg/dl (ref 0.20–1.60)

## 2017-11-23 LAB — FERRITIN

## 2017-11-23 LAB — IRON AND TIBC
%SAT: 36 % (ref 21–57)
IRON: 78 ug/dL (ref 41–142)
TIBC: 215 ug/dL — ABNORMAL LOW (ref 236–444)
UIBC: 137 ug/dL (ref 120–384)

## 2017-11-23 LAB — CBG MONITORING, ED
GLUCOSE-CAPILLARY: 477 mg/dL — AB (ref 65–99)
GLUCOSE-CAPILLARY: 382 mg/dL — AB (ref 65–99)
GLUCOSE-CAPILLARY: 274 mg/dL — AB (ref 65–99)

## 2017-11-23 MED ORDER — SODIUM CHLORIDE 0.9 % IV BOLUS (SEPSIS)
1000.0000 mL | Freq: Once | INTRAVENOUS | Status: AC
  Administered 2017-11-23: 1000 mL via INTRAVENOUS

## 2017-11-23 MED ORDER — INSULIN ASPART 100 UNIT/ML ~~LOC~~ SOLN
10.0000 [IU] | Freq: Once | SUBCUTANEOUS | Status: AC
  Administered 2017-11-23: 10 [IU] via SUBCUTANEOUS
  Filled 2017-11-23: qty 1

## 2017-11-23 MED ORDER — GLIPIZIDE 5 MG PO TABS: 5.0000 mg | ORAL_TABLET | Freq: Every day | ORAL | 0 refills | Status: DC

## 2017-11-23 MED ORDER — METFORMIN HCL 500 MG PO TABS: 500.0000 mg | ORAL_TABLET | Freq: Two times a day (BID) | ORAL | 0 refills | Status: DC

## 2017-11-23 MED FILL — metFORMIN HCL 500 MG TABS: 500 | 30 days supply | Qty: 60 | Fill #0

## 2017-11-23 MED FILL — glipiZIDE 5 MG TABS: 5 | 30 days supply | Qty: 30 | Fill #0

## 2017-11-23 NOTE — ED Provider Notes (Signed)
[facility] EMERGENCY DEPARTMENT Provider Note   CSN: [id number] Arrival date & time: 11/23/17  1311     History   Chief Complaint Chief Complaint  Patient presents with  . Hyperglycemia    HPI Sherri [name] is a 51 y.o. female.  Patient sent from pcp office with hyperglycemia.  Patient denies hx diabetes. States in past 1-2 weeks, ?few lb weight loss, thirsty, mildly lightheaded when stands. No recent prednisone use or change in meds. Denies vomiting. Normal appetite. No fever or chills.    The history is provided by the patient.  Hyperglycemia  Associated symptoms: increased thirst   Associated symptoms: no abdominal pain, no chest pain, no confusion, no fever, no shortness of breath and no vomiting     Past Medical History:  Diagnosis Date  . Angioedema   . Anxiety   . Arthritis   . Asthma   . CAP (community acquired pneumonia) 08/29/2012  . Chronic back pain   . Chronic kidney disease   . Cocaine abuse (HCC)   . Depression   . Diarrhea   . DVT (deep venous thrombosis) (HCC)   . Environmental allergies   . Fall   . GERD (gastroesophageal reflux disease)   . Heart murmur    has been told once that she has a heart murmur, but has never had any problems  . Hiatal hernia 03/21/2013  . Lumbar herniated disc   . Peripheral vascular disease (HCC)   . Pernicious anemia 10/30/2011  . Postconcussion syndrome 12/30/2014  . Seasonal allergies   . Stiff person syndrome   . Urticaria     Patient Active Problem List   Diagnosis Date Noted  . Depression 01/02/2017  . History of DVT (deep vein thrombosis) 01/02/2017  . Chronic pain syndrome 01/02/2017  . Cocaine abuse (HCC) 01/02/2017  . Dysphagia   . Adjustment insomnia   . Distal radius fracture, right 08/16/2016  . Postconcussion syndrome 12/30/2014  . Dizziness 12/18/2014  . Tremor 03/11/2014  . 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 Hiatal hernia 03/21/2013  . Anemia, iron deficiency 01/29/2013  .  Spasm of muscle 09/02/2012  . Benzodiazepine withdrawal (HCC) 08/30/2012  . CAP (community acquired pneumonia) 08/29/2012  . Hypernatremia 08/28/2012  . Metabolic acidosis 08/28/2012  . Leukocytosis 08/28/2012  . UTI (lower urinary tract infection) 08/28/2012  . Anemia 08/28/2012  . Acute back pain 08/27/2012  . Rhabdomyolysis 08/27/2012  . ARF (acute renal failure) (HCC) 08/27/2012  . Hypokalemia 08/27/2012  . Asthma 08/27/2012  . Chronic back pain   . Pernicious anemia 10/30/2011  . DEGENERATIVE DISC DISEASE 08/18/2010  . DEEP VEIN THROMBOSIS/PHLEBITIS 07/28/2010  . Asthma with bronchitis 07/28/2010  . URTICARIA 07/28/2010    Past Surgical History:  Procedure Laterality Date  . BUNIONECTOMY Left   . colonoscopy  07/09/15  . inguinal hernia 1983  1983  . OPEN REDUCTION INTERNAL FIXATION (ORIF) DISTAL RADIAL FRACTURE Right 08/16/2016   Procedure: OPEN REDUCTION INTERNAL FIXATION (ORIF) DISTAL RADIAL FRACTURE;  Surgeon: Eldred MangesMark C Yates, MD;  Location: [facility] OR;  Service: Orthopedics;  Laterality: Right;    OB History    No data available       Home Medications    Prior to Admission medications   Medication Sig Start Date End Date Taking? Authorizing Provider  albuterol (PROVENTIL HFA;VENTOLIN HFA) 108 (90 BASE) MCG/ACT inhaler Inhale 1 puff into the lungs every 6 (six) hours as needed. PRO AIR 06/20/13   Waymon BudgeYoung, Clinton D, MD  Ascorbic  Acid (VITAMIN C) 1000 MG tablet Take 1,000 mg by mouth daily.    [provider]  baclofen (LIORESAL) 20 MG tablet TAKE ONE & ONE-HALF TABLETS BY MOUTH IN THE MORNING AND IN THE EVENING AND ONE AT  MIDDAY 09/25/17   Butch Penny, NP  benzocaine (BABY ORAJEL) 7.5 % oral gel Use as directed in the mouth or throat 3 (three) times daily as needed for pain. 08/08/17   Cathie Hoops, Amy V, PA-C  cetirizine (ZYRTEC ALLERGY) 10 MG tablet Take 1 tablet (10 mg total) by mouth daily. 08/24/17   Belinda Fisher, PA-C  Cholecalciferol (VITAMIN D3) 5000 units CAPS Take  by mouth.    [provider]  cyanocobalamin (,VITAMIN B-12,) 1000 MCG/ML injection Inject 1,000 mcg into the muscle every 3 (three) months.     [provider]  desloratadine (CLARINEX) 5 MG tablet Take 1 tablet (5 mg total) by mouth daily. 11/06/11   Jetty Duhamel D, MD  diazepam (VALIUM) 10 MG tablet TAKE 2 TABLETS BY MOUTH 3 TIMES A DAY 09/12/17   York Spaniel, MD  diphenhydrAMINE (BENADRYL) 25 MG tablet Take 25 mg by mouth at bedtime as needed for itching or allergies.     [provider]  EPIPEN 2-PAK 0.3 MG/0.3ML SOAJ injection USE AS DIRECTED FOR  SEVERE  ALLERGIC  REACTIONS 08/16/15   Jetty Duhamel D, MD  FLUoxetine (PROZAC) 10 MG capsule Take 1 capsule (10 mg total) by mouth daily. 11/19/17   York Spaniel, MD  fluticasone (FLONASE) 50 MCG/ACT nasal spray Place 2 sprays into both nostrils daily. 08/24/17   Cathie Hoops, Amy V, PA-C  Fluticasone-Salmeterol (ADVAIR) 250-50 MCG/DOSE AEPB Inhale 1 puff into the lungs 2 (two) times daily.  03/14/12   Waymon Budge, MD  gabapentin (NEURONTIN) 300 MG capsule 2 capsules in the morning and evening, one capsule at midday 09/25/17   Butch Penny, NP  Menthol, Topical Analgesic, 3.7 % GEL Apply topically as needed.    [provider]  montelukast (SINGULAIR) 10 MG tablet Take 10 mg by mouth at bedtime.    [provider]  OVER THE COUNTER MEDICATION I B Guard    [provider]    Family History Family History  Problem Relation Age of Onset  . Cancer Mother   . Other Mother   . COPD Father   . Asthma Brother   . Cancer Brother        colon  . Heart attack Brother   . Seizures Brother     Social History Social History   Tobacco Use  . Smoking status: Never Smoker  . Smokeless tobacco: Never Used  . Tobacco comment: Never Used Tobacco  Substance Use Topics  . Alcohol use: Yes    Alcohol/week: 0.0 oz    Comment: Occasional  . Drug use: Yes    Types: Cocaine    Comment: in the  last two weeks as of 08/08/17     Allergies   Ibuprofen; Tylenol [acetaminophen]; Amoxicillin; Lemon flavor; and Amoxicillin   Review of Systems Review of Systems  Constitutional: Negative for fever.  HENT: Negative for sore throat.   Eyes: Negative for redness.  Respiratory: Negative for shortness of breath.   Cardiovascular: Negative for chest pain.  Gastrointestinal: Negative for abdominal pain and vomiting.  Endocrine: Positive for polydipsia.  Genitourinary: Negative for flank pain.  Musculoskeletal: Negative for back pain and neck pain.  Skin: Negative for rash.  Neurological: Negative for headaches.  Hematological: Does not bruise/bleed easily.  Psychiatric/Behavioral: Negative for confusion.     Physical Exam Updated Vital Signs BP (!) 120/97   Pulse (!) 118   Temp 98.7 F (37.1 C) (Oral)   Resp 20   Ht 1.6 m (5\' 3" )   Wt 70.3 kg (155 lb)   LMP 07/29/2012   SpO2 100%   BMI 27.46 kg/m   Physical Exam  Constitutional: She appears well-developed and well-nourished. No distress.  HENT:  Mouth/Throat: Oropharynx is clear and moist.  Eyes: Conjunctivae are normal. No scleral icterus.  Neck: Neck supple. No tracheal deviation present.  Cardiovascular: Regular rhythm, normal heart sounds and intact distal pulses. Exam reveals no gallop and no friction rub.  No murmur heard. Pulmonary/Chest: Effort normal and breath sounds normal. No respiratory distress.  Abdominal: Soft. Normal appearance and bowel sounds are normal. She exhibits no distension. There is no tenderness.  Genitourinary:  Genitourinary Comments: No cva tenderness  Musculoskeletal: She exhibits no edema.  Neurological: She is alert.  Skin: Skin is warm and dry. No rash noted. She is not diaphoretic.  Psychiatric: She has a normal mood and affect.  Nursing note and vitals reviewed.    ED Treatments / Results  Labs (all labs ordered are listed, but only abnormal results are displayed) Results  for orders placed or performed during the hospital encounter of 11/23/17  Basic metabolic panel  Result Value Ref Range   Sodium 127 (L) 135 - 145 mmol/L   Potassium 4.2 3.5 - 5.1 mmol/L   Chloride 90 (L) 101 - 111 mmol/L   CO2 28 22 - 32 mmol/L   Glucose, Bld 477 (H) 65 - 99 mg/dL   BUN 13 6 - 20 mg/dL   Creatinine, Ser 1.61 0.44 - 1.00 mg/dL   Calcium 9.8 8.9 - 09.6 mg/dL   GFR calc non Af Amer >60 >60 mL/min   GFR calc Af Amer >60 >60 mL/min   Anion gap 9 5 - 15  Urinalysis, Routine w reflex microscopic  Result Value Ref Range   Color, Urine YELLOW YELLOW   APPearance CLEAR CLEAR   Specific Gravity, Urine 1.010 1.005 - 1.030   pH 7.5 5.0 - 8.0   Glucose, UA >=500 (A) NEGATIVE mg/dL   Hgb urine dipstick SMALL (A) NEGATIVE   Bilirubin Urine NEGATIVE NEGATIVE   Ketones, ur 15 (A) NEGATIVE mg/dL   Protein, ur NEGATIVE NEGATIVE mg/dL   Nitrite NEGATIVE NEGATIVE   Leukocytes, UA NEGATIVE NEGATIVE  CBC with Differential  Result Value Ref Range   WBC 7.8 4.0 - 10.5 K/uL   RBC 4.81 3.87 - 5.11 MIL/uL   Hemoglobin 13.6 12.0 - 15.0 g/dL   HCT 04.5 40.9 - 81.1 %   MCV 79.8 78.0 - 100.0 fL   MCH 28.3 26.0 - 34.0 pg   MCHC 35.4 30.0 - 36.0 g/dL   RDW 91.4 (L) 78.2 - 95.6 %   Platelets 234 150 - 400 K/uL   Neutrophils Relative % 72 %   Neutro Abs 5.7 1.7 - 7.7 K/uL   Lymphocytes Relative 23 %   Lymphs Abs 1.8 0.7 - 4.0 K/uL   Monocytes Relative 5 %   Monocytes Absolute 0.4 0.1 - 1.0 K/uL   Eosinophils Relative 0 %   Eosinophils Absolute 0.0 0.0 - 0.7 K/uL   Basophils Relative 0 %   Basophils Absolute 0.0 0.0 - 0.1 K/uL  Urinalysis, Microscopic (reflex)  Result Value Ref Range   RBC / HPF 0-5  0 - 5 RBC/hpf   WBC, UA NONE SEEN 0 - 5 WBC/hpf   Bacteria, UA RARE (A) NONE SEEN   Squamous Epithelial / LPF NONE SEEN NONE SEEN  POC CBG, ED  Result Value Ref Range   Glucose-Capillary 477 (H) 65 - 99 mg/dL   Comment 1 Notify RN   CBG monitoring, ED  Result Value Ref Range    Glucose-Capillary 382 (H) 65 - 99 mg/dL    EKG  EKG Interpretation None       Radiology No results found.  Procedures Procedures (including critical care time)  Medications Ordered in ED Medications  sodium chloride 0.9 % bolus 1,000 mL (not administered)  insulin aspart (novoLOG) injection 10 Units (not administered)     Initial Impression / Assessment and Plan / ED Course  I have reviewed the triage vital signs and the nursing notes.  Pertinent labs & imaging results that were available during my care of the patient were reviewed by me and considered in my medical decision making (see chart for details).  Iv ns bolus.  hco3 normal.   novolog sq.  Reviewed nursing notes and prior charts for additional history.   Glucose slowly improving.  Additional ivf.  Po fluids.  Signed out to Dr Verdie MosherLiu to recheck post ivf, and post cbg recheck(s) - anticipate pt will be able to be d/c to home with close outpatient follow up.   Final Clinical Impressions(s) / ED Diagnoses   Final diagnoses:  None    ED Discharge Orders    None       Cathren LaineSteinl, Teng Decou, MD 11/23/17 1513

## 2017-11-23 NOTE — ED Triage Notes (Signed)
She was seen by her MD today for routine check up and her blood sugar was 503. No hx of diabetes.

## 2017-11-23 NOTE — ED Provider Notes (Signed)
Please see previous physicians note regarding patient's presenting history and physical, initial ED course, and associated medical decision making.  51 year old female who presents with hyperglycemia.  Has new diabetes diagnoses.  No signs of DKA on blood work.  She had received 2 L of fluids including 10 units of short acting insulin.  Follow-up point-of-care glucose 274.  She feels improved.  She will be discharged home with prescriptions for metformin and glipizide.  Discussed close 1 week follow-up with PCP for monitoring and recheck. Strict return and follow-up instructions reviewed. She expressed understanding of all discharge instructions and felt comfortable with the plan of care.    Lavera GuiseLiu, Skylah Delauter Duo, MD 11/23/17 [phone]

## 2017-11-23 NOTE — ED Notes (Signed)
Pt c/o dizzy and leg cramps lately. Has not been diagnosed with diabetes.

## 2017-11-23 NOTE — Discharge Instructions (Signed)
It was our pleasure to provide your ER care today - we hope that you feel better.  Drink adequate fluids/water.  Follow diabetic eating/meal planning guide.   Take metformin and glipizide as prescribed.  Check sugars 4x/day and record values.   Follow up with primary care doctor this  Monday.  Also call the [place] [facility] program - see referral - follow up there in the next couple days.   Return to ER if worse, new symptoms, vomiting, weak/faint, other concern.

## 2017-11-23 NOTE — Progress Notes (Signed)
Hematology and Oncology Follow Up Visit  Sherri [name] [id number] 1966/10/22 51 y.o. 11/23/2017   Principle Diagnosis:  1. Pernicious anemia - anti-intrinsic factor antibodies.  2. Intermittent iron-deficiency anemia.  3. 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  4.  Diabetes mellitus -new diagnosis on 11/23/2017   Current Therapy:   1. Vitamin B12, 1 mg IM q.4 months.  2. IV iron as indicated - last received in January 2018    Interim History: Sherri [name] is here for a follow-up today. She is a low bit tired today.  She just feels under the weather.  She has felt more fatigued.  She is slept a lot more.  She has urinated a little bit more often.  She has had no fever.  She has had no nausea or vomiting.  She has had no cough..  She has complained of leg cramps.  These seem to happen during the evening.  She has had no swelling of the legs.  Of note, she said that she actually has done an "old school" test for diabetes.  She says she actually states that her own urine.  She is said that it was sweet.  As such it did not surprise her when I told her that she had diabetes.    Overall, her performance status is ECOG 1.  Medications:  Allergies as of 11/23/2017      Reactions   Ibuprofen Other (See Comments)   Does not take due to hx of renal insufficiency "I have kidney disease"    Tylenol [acetaminophen] Hives   Cannot take large quantities   Amoxicillin Diarrhea, Other (See Comments)   Lemon Flavor Swelling   Severe Lip Swelling  FRUIT per pt.     Amoxicillin Diarrhea      Medication List        Accurate as of 11/23/17 12:45 PM. Always use your most recent med list.          albuterol 108 (90 Base) MCG/ACT inhaler Commonly known as:  PROVENTIL HFA;VENTOLIN HFA Inhale 1 puff into the lungs every 6 (six) hours as needed. PRO AIR   baclofen 20 MG tablet Commonly known as:  LIORESAL TAKE ONE & ONE-HALF TABLETS BY MOUTH IN THE MORNING AND IN THE  EVENING AND ONE AT  MIDDAY   benzocaine 7.5 % oral gel Commonly known as:  BABY ORAJEL Use as directed in the mouth or throat 3 (three) times daily as needed for pain.   cetirizine 10 MG tablet Commonly known as:  ZYRTEC ALLERGY Take 1 tablet (10 mg total) by mouth daily.   cyanocobalamin 1000 MCG/ML injection Commonly known as:  (VITAMIN B-12) Inject 1,000 mcg into the muscle every 3 (three) months.   desloratadine 5 MG tablet Commonly known as:  CLARINEX Take 1 tablet (5 mg total) by mouth daily.   diazepam 10 MG tablet Commonly known as:  VALIUM TAKE 2 TABLETS BY MOUTH 3 TIMES A DAY   diphenhydrAMINE 25 MG tablet Commonly known as:  BENADRYL Take 25 mg by mouth at bedtime as needed for itching or allergies.   EPIPEN 2-PAK 0.3 mg/0.3 mL Soaj injection Generic drug:  EPINEPHrine USE AS DIRECTED FOR  SEVERE  ALLERGIC  REACTIONS   FLUoxetine 10 MG capsule Commonly known as:  PROZAC Take 1 capsule (10 mg total) by mouth daily.   fluticasone 50 MCG/ACT nasal spray Commonly known as:  FLONASE Place 2 sprays into both nostrils daily.   Fluticasone-Salmeterol 250-50 MCG/DOSE  Aepb Commonly known as:  ADVAIR Inhale 1 puff into the lungs 2 (two) times daily.   gabapentin 300 MG capsule Commonly known as:  NEURONTIN 2 capsules in the morning and evening, one capsule at midday   Menthol (Topical Analgesic) 3.7 % Gel Apply topically as needed.   montelukast 10 MG tablet Commonly known as:  SINGULAIR Take 10 mg by mouth at bedtime.   OVER THE COUNTER MEDICATION I B Guard   vitamin C 1000 MG tablet Take 1,000 mg by mouth daily.   Vitamin D3 5000 units Caps Take by mouth.       Allergies:  Allergies  Allergen Reactions  . Ibuprofen Other (See Comments)    Does not take due to hx of renal insufficiency "I have kidney disease"   . Tylenol [Acetaminophen] Hives    Cannot take large quantities  . Amoxicillin Diarrhea and Other (See Comments)  . Lemon Flavor  Swelling    Severe Lip Swelling  FRUIT per pt.    . Amoxicillin Diarrhea    Past Medical History, Surgical history, Social history, and Family History were reviewed and updated.  Review of Systems: All other 10 point review of systems is negative.   Physical Exam:  weight is 155 lb (70.3 kg). Her oral temperature is 98.3 F (36.8 C). Her blood pressure is 119/80 and her pulse is 112 (abnormal). Her respiration is 16 and oxygen saturation is 100%.   Wt Readings from Last 3 Encounters:  11/23/17 155 lb (70.3 kg)  09/25/17 147 lb 6.4 oz (66.9 kg)  08/08/17 130 lb 12.8 oz (59.3 kg)    Petite African-American female.  She is slightly tired.  Head neck exam shows no ocular or oral lesions.  There are no palpable cervical or supraclavicular lymph nodes.  She has no palpable thyroid.  She has no thrush.  Lungs are clear bilaterally.  Cardiac exam tachycardia rate but regular rhythm with no murmurs, rubs or bruits.  Abdomen is soft.  She has slight abdominal distention.  There is decreased bowel sounds.  There is no guarding or rebound tenderness.  Extremities shows no clubbing, cyanosis or edema.  Skin exam is slightly dry.  Neurological exam is nonfocal.     Lab Results  Component Value Date   WBC 6.9 11/23/2017   HGB 13.1 11/23/2017   HCT 36.7 11/23/2017   MCV 81 11/23/2017   PLT 233 11/23/2017   Lab Results  Component Value Date   FERRITIN 583 (H) 07/23/2017   IRON 69 07/23/2017   TIBC 224 (L) 07/23/2017   UIBC 155 07/23/2017   IRONPCTSAT 31 07/23/2017   Lab Results  Component Value Date   RETICCTPCT 1.8 12/14/2015   RBC 4.54 11/23/2017   RETICCTABS 71.8 12/14/2015   Lab Results  Component Value Date   KPAFRELGTCHN 1.06 08/02/2011   LAMBDASER 1.49 08/02/2011   KAPLAMBRATIO 0.71 08/02/2011   Lab Results  Component Value Date   IGGSERUM 1180 08/02/2011   IGA 206 08/02/2011   IGMSERUM 198 08/02/2011   Lab Results  Component Value Date   TOTALPROTELP 6.6 08/02/2011     ALBUMINELP 57.1 08/02/2011   A1GS 4.1 08/02/2011   A2GS 9.6 08/02/2011   BETS 6.1 08/02/2011   BETA2SER 5.9 08/02/2011   GAMS 17.2 08/02/2011   MSPIKE NOT DET 08/02/2011   SPEI * 08/02/2011     Chemistry      Component Value Date/Time   NA 136 11/23/2017 1147   NA 144 03/22/2017 0943  K 3.9 11/23/2017 1147   K 4.2 03/22/2017 0943   CL 94 (L) 11/23/2017 1147   CO2 30 11/23/2017 1147   CO2 29 03/22/2017 0943   BUN 10 11/23/2017 1147   BUN 11.1 03/22/2017 0943   CREATININE 1.0 11/23/2017 1147   CREATININE 1.0 03/22/2017 0943      Component Value Date/Time   CALCIUM 10.1 11/23/2017 1147   CALCIUM 9.9 03/22/2017 0943   ALKPHOS 174 (H) 11/23/2017 1147   ALKPHOS 114 03/22/2017 0943   AST 23 11/23/2017 1147   AST 18 03/22/2017 0943   ALT 46 11/23/2017 1147   ALT 23 03/22/2017 0943   BILITOT 0.60 11/23/2017 1147   BILITOT 0.24 03/22/2017 0943     Impression and Plan: Sherri [name] is 51 yo African American female with pernicious anemia and intermittent iron deficiency anemia.   Unfortunately, she clearly has diabetes now.  I believe that she has to go to the emergency room.  Her blood sugar is just way too high to be treated in the office.  I explained to her the problem.  She understands.  I think her sodium is going to be quite high when it is corrected for her hyperglycemia.  This might be why she gets these cramps.  I know that she has a lot going on.  This is something that she is going to have to manage.  I suppose that given that she has pernicious anemia, that having diabetes could be another type of "autoimmune" problem.    I will plan to see her back in another couple months.  By then, I would believe that she should be in much better shape.     Josph MachoPeter R Ilir Mahrt, MD 11/30/201812:45 PM

## 2017-11-23 NOTE — ED Notes (Signed)
ED Provider at bedside. 

## 2017-11-24 LAB — VITAMIN B12: Vitamin B12: 775 pg/mL (ref 232–1245)

## 2017-11-24 LAB — ERYTHROPOIETIN: ERYTHROPOIETIN: 5.1 m[IU]/mL (ref 2.6–18.5)

## 2017-11-26 DIAGNOSIS — E1165 Type 2 diabetes mellitus with hyperglycemia: Secondary | ICD-10-CM | POA: Diagnosis not present

## 2017-11-26 DIAGNOSIS — Z833 Family history of diabetes mellitus: Secondary | ICD-10-CM | POA: Diagnosis not present

## 2017-11-28 DIAGNOSIS — M6281 Muscle weakness (generalized): Secondary | ICD-10-CM | POA: Diagnosis not present

## 2017-11-28 DIAGNOSIS — M545 Low back pain: Secondary | ICD-10-CM | POA: Diagnosis not present

## 2017-11-28 DIAGNOSIS — N182 Chronic kidney disease, stage 2 (mild): Secondary | ICD-10-CM | POA: Diagnosis not present

## 2017-11-28 DIAGNOSIS — G2582 Stiff-man syndrome: Secondary | ICD-10-CM | POA: Diagnosis not present

## 2017-11-28 DIAGNOSIS — I129 Hypertensive chronic kidney disease with stage 1 through stage 4 chronic kidney disease, or unspecified chronic kidney disease: Secondary | ICD-10-CM | POA: Diagnosis not present

## 2017-11-28 DIAGNOSIS — J4520 Mild intermittent asthma, uncomplicated: Secondary | ICD-10-CM | POA: Diagnosis not present

## 2017-12-05 DIAGNOSIS — G2582 Stiff-man syndrome: Secondary | ICD-10-CM | POA: Diagnosis not present

## 2017-12-05 DIAGNOSIS — N182 Chronic kidney disease, stage 2 (mild): Secondary | ICD-10-CM | POA: Diagnosis not present

## 2017-12-05 DIAGNOSIS — M6281 Muscle weakness (generalized): Secondary | ICD-10-CM | POA: Diagnosis not present

## 2017-12-05 DIAGNOSIS — M545 Low back pain: Secondary | ICD-10-CM | POA: Diagnosis not present

## 2017-12-05 DIAGNOSIS — J4520 Mild intermittent asthma, uncomplicated: Secondary | ICD-10-CM | POA: Diagnosis not present

## 2017-12-05 DIAGNOSIS — I129 Hypertensive chronic kidney disease with stage 1 through stage 4 chronic kidney disease, or unspecified chronic kidney disease: Secondary | ICD-10-CM | POA: Diagnosis not present

## 2017-12-06 DIAGNOSIS — I129 Hypertensive chronic kidney disease with stage 1 through stage 4 chronic kidney disease, or unspecified chronic kidney disease: Secondary | ICD-10-CM | POA: Diagnosis not present

## 2017-12-06 DIAGNOSIS — G2582 Stiff-man syndrome: Secondary | ICD-10-CM | POA: Diagnosis not present

## 2017-12-06 DIAGNOSIS — N182 Chronic kidney disease, stage 2 (mild): Secondary | ICD-10-CM | POA: Diagnosis not present

## 2017-12-06 DIAGNOSIS — J4520 Mild intermittent asthma, uncomplicated: Secondary | ICD-10-CM | POA: Diagnosis not present

## 2017-12-06 DIAGNOSIS — M6281 Muscle weakness (generalized): Secondary | ICD-10-CM | POA: Diagnosis not present

## 2017-12-06 DIAGNOSIS — M545 Low back pain: Secondary | ICD-10-CM | POA: Diagnosis not present

## 2017-12-11 DIAGNOSIS — I129 Hypertensive chronic kidney disease with stage 1 through stage 4 chronic kidney disease, or unspecified chronic kidney disease: Secondary | ICD-10-CM | POA: Diagnosis not present

## 2017-12-11 DIAGNOSIS — M545 Low back pain: Secondary | ICD-10-CM | POA: Diagnosis not present

## 2017-12-11 DIAGNOSIS — G2582 Stiff-man syndrome: Secondary | ICD-10-CM | POA: Diagnosis not present

## 2017-12-11 DIAGNOSIS — N182 Chronic kidney disease, stage 2 (mild): Secondary | ICD-10-CM | POA: Diagnosis not present

## 2017-12-11 DIAGNOSIS — J4520 Mild intermittent asthma, uncomplicated: Secondary | ICD-10-CM | POA: Diagnosis not present

## 2017-12-11 DIAGNOSIS — M6281 Muscle weakness (generalized): Secondary | ICD-10-CM | POA: Diagnosis not present

## 2017-12-13 DIAGNOSIS — N182 Chronic kidney disease, stage 2 (mild): Secondary | ICD-10-CM | POA: Diagnosis not present

## 2017-12-13 DIAGNOSIS — G2582 Stiff-man syndrome: Secondary | ICD-10-CM | POA: Diagnosis not present

## 2017-12-13 DIAGNOSIS — I129 Hypertensive chronic kidney disease with stage 1 through stage 4 chronic kidney disease, or unspecified chronic kidney disease: Secondary | ICD-10-CM | POA: Diagnosis not present

## 2017-12-13 DIAGNOSIS — M6281 Muscle weakness (generalized): Secondary | ICD-10-CM | POA: Diagnosis not present

## 2017-12-13 DIAGNOSIS — M545 Low back pain: Secondary | ICD-10-CM | POA: Diagnosis not present

## 2017-12-13 DIAGNOSIS — J4520 Mild intermittent asthma, uncomplicated: Secondary | ICD-10-CM | POA: Diagnosis not present

## 2017-12-19 DIAGNOSIS — I129 Hypertensive chronic kidney disease with stage 1 through stage 4 chronic kidney disease, or unspecified chronic kidney disease: Secondary | ICD-10-CM | POA: Diagnosis not present

## 2017-12-19 DIAGNOSIS — J4520 Mild intermittent asthma, uncomplicated: Secondary | ICD-10-CM | POA: Diagnosis not present

## 2017-12-19 DIAGNOSIS — M6281 Muscle weakness (generalized): Secondary | ICD-10-CM | POA: Diagnosis not present

## 2017-12-19 DIAGNOSIS — N182 Chronic kidney disease, stage 2 (mild): Secondary | ICD-10-CM | POA: Diagnosis not present

## 2017-12-19 DIAGNOSIS — G2582 Stiff-man syndrome: Secondary | ICD-10-CM | POA: Diagnosis not present

## 2017-12-19 DIAGNOSIS — M545 Low back pain: Secondary | ICD-10-CM | POA: Diagnosis not present

## 2017-12-20 DIAGNOSIS — R300 Dysuria: Secondary | ICD-10-CM | POA: Diagnosis not present

## 2017-12-20 DIAGNOSIS — B379 Candidiasis, unspecified: Secondary | ICD-10-CM | POA: Diagnosis not present

## 2017-12-21 DIAGNOSIS — N182 Chronic kidney disease, stage 2 (mild): Secondary | ICD-10-CM | POA: Diagnosis not present

## 2017-12-21 DIAGNOSIS — G2582 Stiff-man syndrome: Secondary | ICD-10-CM | POA: Diagnosis not present

## 2017-12-21 DIAGNOSIS — M545 Low back pain: Secondary | ICD-10-CM | POA: Diagnosis not present

## 2017-12-21 DIAGNOSIS — J4520 Mild intermittent asthma, uncomplicated: Secondary | ICD-10-CM | POA: Diagnosis not present

## 2017-12-21 DIAGNOSIS — M6281 Muscle weakness (generalized): Secondary | ICD-10-CM | POA: Diagnosis not present

## 2017-12-21 DIAGNOSIS — I129 Hypertensive chronic kidney disease with stage 1 through stage 4 chronic kidney disease, or unspecified chronic kidney disease: Secondary | ICD-10-CM | POA: Diagnosis not present

## 2017-12-26 DIAGNOSIS — N182 Chronic kidney disease, stage 2 (mild): Secondary | ICD-10-CM | POA: Diagnosis not present

## 2017-12-26 DIAGNOSIS — G2582 Stiff-man syndrome: Secondary | ICD-10-CM | POA: Diagnosis not present

## 2017-12-26 DIAGNOSIS — I129 Hypertensive chronic kidney disease with stage 1 through stage 4 chronic kidney disease, or unspecified chronic kidney disease: Secondary | ICD-10-CM | POA: Diagnosis not present

## 2017-12-26 DIAGNOSIS — M545 Low back pain: Secondary | ICD-10-CM | POA: Diagnosis not present

## 2017-12-26 DIAGNOSIS — M6281 Muscle weakness (generalized): Secondary | ICD-10-CM | POA: Diagnosis not present

## 2017-12-26 DIAGNOSIS — J4520 Mild intermittent asthma, uncomplicated: Secondary | ICD-10-CM | POA: Diagnosis not present

## 2017-12-27 ENCOUNTER — Other Ambulatory Visit: Payer: Self-pay

## 2017-12-27 ENCOUNTER — Ambulatory Visit (INDEPENDENT_AMBULATORY_CARE_PROVIDER_SITE_OTHER): Payer: Medicare Other | Admitting: Nurse Practitioner

## 2017-12-27 ENCOUNTER — Encounter: Payer: Self-pay | Admitting: Nurse Practitioner

## 2017-12-27 VITALS — BP 118/78 | Temp 90.0°F | Ht 63.0 in | Wt 151.4 lb

## 2017-12-27 DIAGNOSIS — J4520 Mild intermittent asthma, uncomplicated: Secondary | ICD-10-CM | POA: Diagnosis not present

## 2017-12-27 DIAGNOSIS — N182 Chronic kidney disease, stage 2 (mild): Secondary | ICD-10-CM | POA: Diagnosis not present

## 2017-12-27 DIAGNOSIS — M545 Low back pain: Secondary | ICD-10-CM | POA: Diagnosis not present

## 2017-12-27 DIAGNOSIS — Z780 Asymptomatic menopausal state: Secondary | ICD-10-CM | POA: Diagnosis not present

## 2017-12-27 DIAGNOSIS — M6281 Muscle weakness (generalized): Secondary | ICD-10-CM | POA: Diagnosis not present

## 2017-12-27 DIAGNOSIS — R197 Diarrhea, unspecified: Secondary | ICD-10-CM

## 2017-12-27 DIAGNOSIS — Z6826 Body mass index (BMI) 26.0-26.9, adult: Secondary | ICD-10-CM

## 2017-12-27 DIAGNOSIS — G2582 Stiff-man syndrome: Secondary | ICD-10-CM | POA: Diagnosis not present

## 2017-12-27 DIAGNOSIS — I129 Hypertensive chronic kidney disease with stage 1 through stage 4 chronic kidney disease, or unspecified chronic kidney disease: Secondary | ICD-10-CM | POA: Diagnosis not present

## 2017-12-27 NOTE — Patient Instructions (Addendum)
Eat healthy meals and increase exercise for weight loss to 140 pounds.  Keep your appointment with the nutritionist.    Health Maintenance for Postmenopausal Women Menopause is a normal process in which your reproductive ability comes to an end. This process happens gradually over a span of months to years, usually between the ages of 69 and 71. Menopause is complete when you have missed 12 consecutive menstrual periods. It is important to talk with your health care provider about some of the most common conditions that affect postmenopausal women, such as heart disease, cancer, and bone loss (osteoporosis). Adopting a healthy lifestyle and getting preventive care can help to promote your health and wellness. Those actions can also lower your chances of developing some of these common conditions. What should I know about menopause? During menopause, you may experience a number of symptoms, such as:  Moderate-to-severe hot flashes.  Night sweats.  Decrease in sex drive.  Mood swings.  Headaches.  Tiredness.  Irritability.  Memory problems.  Insomnia.  Choosing to treat or not to treat menopausal changes is an individual decision that you make with your health care provider. What should I know about hormone replacement therapy and supplements? Hormone therapy products are effective for treating symptoms that are associated with menopause, such as hot flashes and night sweats. Hormone replacement carries certain risks, especially as you become older. If you are thinking about using estrogen or estrogen with progestin treatments, discuss the benefits and risks with your health care provider. What should I know about heart disease and stroke? Heart disease, heart attack, and stroke become more likely as you age. This may be due, in part, to the hormonal changes that your body experiences during menopause. These can affect how your body processes dietary fats, triglycerides, and  cholesterol. Heart attack and stroke are both medical emergencies. There are many things that you can do to help prevent heart disease and stroke:  Have your blood pressure checked at least every 1-2 years. High blood pressure causes heart disease and increases the risk of stroke.  If you are 10-40 years old, ask your health care provider if you should take aspirin to prevent a heart attack or a stroke.  Do not use any tobacco products, including cigarettes, chewing tobacco, or electronic cigarettes. If you need help quitting, ask your health care provider.  It is important to eat a healthy diet and maintain a healthy weight. ? Be sure to include plenty of vegetables, fruits, low-fat dairy products, and lean protein. ? Avoid eating foods that are high in solid fats, added sugars, or salt (sodium).  Get regular exercise. This is one of the most important things that you can do for your health. ? Try to exercise for at least 150 minutes each week. The type of exercise that you do should increase your heart rate and make you sweat. This is known as moderate-intensity exercise. ? Try to do strengthening exercises at least twice each week. Do these in addition to the moderate-intensity exercise.  Know your numbers.Ask your health care provider to check your cholesterol and your blood glucose. Continue to have your blood tested as directed by your health care provider.  What should I know about cancer screening? There are several types of cancer. Take the following steps to reduce your risk and to catch any cancer development as early as possible. Breast Cancer  Practice breast self-awareness. ? This means understanding how your breasts normally appear and feel. ? It also means  doing regular breast self-exams. Let your health care provider know about any changes, no matter how small.  If you are 41 or older, have a clinician do a breast exam (clinical breast exam or CBE) every year. Depending  on your age, family history, and medical history, it may be recommended that you also have a yearly breast X-ray (mammogram).  If you have a family history of breast cancer, talk with your health care provider about genetic screening.  If you are at high risk for breast cancer, talk with your health care provider about having an MRI and a mammogram every year.  Breast cancer (BRCA) gene test is recommended for women who have family members with BRCA-related cancers. Results of the assessment will determine the need for genetic counseling and BRCA1 and for BRCA2 testing. BRCA-related cancers include these types: ? Breast. This occurs in males or females. ? Ovarian. ? Tubal. This may also be called fallopian tube cancer. ? Cancer of the abdominal or pelvic lining (peritoneal cancer). ? Prostate. ? Pancreatic.  Cervical, Uterine, and Ovarian Cancer Your health care provider may recommend that you be screened regularly for cancer of the pelvic organs. These include your ovaries, uterus, and vagina. This screening involves a pelvic exam, which includes checking for microscopic changes to the surface of your cervix (Pap test).  For women ages 21-65, health care providers may recommend a pelvic exam and a Pap test every three years. For women ages 81-65, they may recommend the Pap test and pelvic exam, combined with testing for human papilloma virus (HPV), every five years. Some types of HPV increase your risk of cervical cancer. Testing for HPV may also be done on women of any age who have unclear Pap test results.  Other health care providers may not recommend any screening for nonpregnant women who are considered low risk for pelvic cancer and have no symptoms. Ask your health care provider if a screening pelvic exam is right for you.  If you have had past treatment for cervical cancer or a condition that could lead to cancer, you need Pap tests and screening for cancer for at least 20 years after  your treatment. If Pap tests have been discontinued for you, your risk factors (such as having a new sexual partner) need to be reassessed to determine if you should start having screenings again. Some women have medical problems that increase the chance of getting cervical cancer. In these cases, your health care provider may recommend that you have screening and Pap tests more often.  If you have a family history of uterine cancer or ovarian cancer, talk with your health care provider about genetic screening.  If you have vaginal bleeding after reaching menopause, tell your health care provider.  There are currently no reliable tests available to screen for ovarian cancer.  Lung Cancer Lung cancer screening is recommended for adults 82-21 years old who are at high risk for lung cancer because of a history of smoking. A yearly low-dose CT scan of the lungs is recommended if you:  Currently smoke.  Have a history of at least 30 pack-years of smoking and you currently smoke or have quit within the past 15 years. A pack-year is smoking an average of one pack of cigarettes per day for one year.  Yearly screening should:  Continue until it has been 15 years since you quit.  Stop if you develop a health problem that would prevent you from having lung cancer treatment.  Colorectal  Cancer  This type of cancer can be detected and can often be prevented.  Routine colorectal cancer screening usually begins at age 61 and continues through age 22.  If you have risk factors for colon cancer, your health care provider may recommend that you be screened at an earlier age.  If you have a family history of colorectal cancer, talk with your health care provider about genetic screening.  Your health care provider may also recommend using home test kits to check for hidden blood in your stool.  A small camera at the end of a tube can be used to examine your colon directly (sigmoidoscopy or colonoscopy).  This is done to check for the earliest forms of colorectal cancer.  Direct examination of the colon should be repeated every 5-10 years until age 50. However, if early forms of precancerous polyps or small growths are found or if you have a family history or genetic risk for colorectal cancer, you may need to be screened more often.  Skin Cancer  Check your skin from head to toe regularly.  Monitor any moles. Be sure to tell your health care provider: ? About any new moles or changes in moles, especially if there is a change in a mole's shape or color. ? If you have a mole that is larger than the size of a pencil eraser.  If any of your family members has a history of skin cancer, especially at a young age, talk with your health care provider about genetic screening.  Always use sunscreen. Apply sunscreen liberally and repeatedly throughout the day.  Whenever you are outside, protect yourself by wearing long sleeves, pants, a wide-brimmed hat, and sunglasses.  What should I know about osteoporosis? Osteoporosis is a condition in which bone destruction happens more quickly than new bone creation. After menopause, you may be at an increased risk for osteoporosis. To help prevent osteoporosis or the bone fractures that can happen because of osteoporosis, the following is recommended:  If you are 58-61 years old, get at least 1,000 mg of calcium and at least 600 mg of vitamin D per day.  If you are older than age 65 but younger than age 38, get at least 1,200 mg of calcium and at least 600 mg of vitamin D per day.  If you are older than age 78, get at least 1,200 mg of calcium and at least 800 mg of vitamin D per day.  Smoking and excessive alcohol intake increase the risk of osteoporosis. Eat foods that are rich in calcium and vitamin D, and do weight-bearing exercises several times each week as directed by your health care provider. What should I know about how menopause affects my mental  health? Depression may occur at any age, but it is more common as you become older. Common symptoms of depression include:  Low or sad mood.  Changes in sleep patterns.  Changes in appetite or eating patterns.  Feeling an overall lack of motivation or enjoyment of activities that you previously enjoyed.  Frequent crying spells.  Talk with your health care provider if you think that you are experiencing depression. What should I know about immunizations? It is important that you get and maintain your immunizations. These include:  Tetanus, diphtheria, and pertussis (Tdap) booster vaccine.  Influenza every year before the flu season begins.  Pneumonia vaccine.  Shingles vaccine.  Your health care provider may also recommend other immunizations. This information is not intended to replace advice given to  you by your health care provider. Make sure you discuss any questions you have with your health care provider. Document Released: 02/02/2006 Document Revised: 06/30/2016 Document Reviewed: 09/14/2015 Elsevier Interactive Patient Education  2018 [employer].  Diarrhea, Adult Diarrhea is when you have loose and water poop (stool) often. Diarrhea can make you feel weak and cause you to get dehydrated. Dehydration can make you tired and thirsty, make you have a dry mouth, and make it so you pee (urinate) less often. Diarrhea often lasts 2-3 days. However, it can last longer if it is a sign of something more serious. It is important to treat your diarrhea as told by your doctor. Follow these instructions at home: Eating and drinking  Follow these recommendations as told by your doctor:  Take an oral rehydration solution (ORS). This is a drink that is sold at pharmacies and stores.  Drink clear fluids, such as: ? Water. ? Ice chips. ? Diluted fruit juice. ? Low-calorie sports drinks.  Eat bland, easy-to-digest foods in small amounts as you are able. These foods  include: ? Bananas. ? Applesauce. ? Rice. ? Low-fat (lean) meats. ? Toast. ? Crackers.  Avoid drinking fluids that have a lot of sugar or caffeine in them.  Avoid alcohol.  Avoid spicy or fatty foods.  General instructions   Drink enough fluid to keep your pee (urine) clear or pale yellow.  Wash your hands often. If you cannot use soap and water, use hand sanitizer.  Make sure that all people in your home wash their hands well and often.  Take over-the-counter and prescription medicines only as told by your doctor.  Rest at home while you get better.  Watch your condition for any changes.  Take a warm bath to help with any burning or pain from having diarrhea.  Keep all follow-up visits as told by your doctor. This is important. Contact a doctor if:  You have a fever.  Your diarrhea gets worse.  You have new symptoms.  You cannot keep fluids down.  You feel light-headed or dizzy.  You have a headache.  You have muscle cramps. Get help right away if:  You have chest pain.  You feel very weak or you pass out (faint).  You have bloody or black poop or poop that look like tar.  You have very bad pain, cramping, or bloating in your belly (abdomen).  You have trouble breathing or you are breathing very quickly.  Your heart is beating very quickly.  Your skin feels cold and clammy.  You feel confused.  You have signs of dehydration, such as: ? Dark pee, hardly any pee, or no pee. ? Cracked lips. ? Dry mouth. ? Sunken eyes. ? Sleepiness. ? Weakness. This information is not intended to replace advice given to you by your health care provider. Make sure you discuss any questions you have with your health care provider. Document Released: 05/29/2008 Document Revised: 06/30/2016 Document Reviewed: 08/17/2015 Elsevier Interactive Patient Education  2018 [employer].

## 2017-12-27 NOTE — Progress Notes (Signed)
GYNECOLOGY OFFICE VISIT NOTE   History:  52 y.o. G1P1001 here today to establish GYN care here. She denies any abnormal vaginal discharge, bleeding, pelvic pain or other concerns.   Most recent ER visit note reviewed.  Client has a PCP, hematologist and a neurologist.  See also notes in her past history.  Has recently been treated by her PCP for a UTI and a yeast infection.  Is completing meds for UTI and has taken meds for yeast infection - does not have any questions or concerns about those.  She is sexually active with 2 female partners.  Does not use contraception as she is menopausal for the past 2-3 years.  Past Medical History:  Diagnosis Date  . Angioedema   . Anxiety   . Arthritis   . Asthma   . CAP (community acquired pneumonia) 08/29/2012  . Chronic back pain   . Chronic kidney disease   . Cocaine abuse (HCC)   . Depression   . Diarrhea   . DVT (deep venous thrombosis) (HCC)   . Environmental allergies   . Fall   . GERD (gastroesophageal reflux disease)   . Heart murmur    has been told once that she has a heart murmur, but has never had any problems  . Hiatal hernia 03/21/2013  . Lumbar herniated disc   . Peripheral vascular disease (HCC)   . Pernicious anemia 10/30/2011  . Postconcussion syndrome 12/30/2014  . Seasonal allergies   . Stiff person syndrome   . Urticaria     Past Surgical History:  Procedure Laterality Date  . BUNIONECTOMY Left   . colonoscopy  07/09/15  . inguinal hernia 1983  1983  . OPEN REDUCTION INTERNAL FIXATION (ORIF) DISTAL RADIAL FRACTURE Right 08/16/2016   Procedure: OPEN REDUCTION INTERNAL FIXATION (ORIF) DISTAL RADIAL FRACTURE;  Surgeon: Eldred Manges, MD;  Location: [facility] OR;  Service: Orthopedics;  Laterality: Right;    The following portions of the patient's history were reviewed and updated as appropriate: allergies, current medications, past family history, past medical history, past social history, past surgical history and problem list.    Health Maintenance:  Will send ROI for most recent pap results - client was not sure of the result.  Has info from her PCP on scheduling mammogram - declines to have this office do that for her.  Had colonoscopy done last year - did not review those results as she has a PCP form most of her medical needs.   Review of Systems:  Pertinent items noted in HPI and remainder of comprehensive ROS otherwise negative.  Objective:  Physical Exam BP 118/78   Temp (!) 90 F (32.2 C)   Ht 5\' 3"  (1.6 m)   Wt 151 lb 6.4 oz (68.7 kg)   LMP 07/29/2012   BMI 26.82 kg/m  CONSTITUTIONAL: Well-developed, well-nourished female in no acute distress.  HENT:  Normocephalic, atraumatic. External right and left ear normal. Oropharynx is clear and moist EYES: Conjunctivae and EOM are normal. Pupils are equal, round, and reactive to light. No scleral icterus.  NECK: Normal range of motion, supple, no masses, normal thyroid. SKIN: Skin is warm and dry. No rash noted. Not diaphoretic. No erythema. No pallor. NEUROLOGIC: Alert and oriented to person, place, and time.  No cranial nerve deficit noted. PSYCHIATRIC: Normal mood and affect. Normal behavior. Normal judgment and thought content. CARDIOVASCULAR: Normal heart rate noted RESPIRATORY: Effort and breath sounds normal, no problems with respiration noted ABDOMEN: Soft, no distention  noted.   PELVIC: Deferred MUSCULOSKELETAL: Normal range of motion. Walks with a cane due to chronic health issues.  No edema noted.  Labs and Imaging No results found.  Assessment & Plan:  1. Menopause present No menses in last 2 years.  Is here to establish with the practice although she had a full physical and PAP in October 2018 at her PCP - Dr. Velna Hatchetobin Sanders.  Wants to schedule her own mammogram appointment - has the info given to her from her PCP.  Has had colonoscopy last year.  2. Body mass index (bmi) 26.0-26.9, adult Advised weight loss to 140 pounds - she has an  appointment scheduled with a nutritionist as she is a newly diagnosed with type 2 diabetes.  3. Diarrhea in adult patient Has been mild for the past 2 weeks.  Is not worried.  Has a plan for over the counter med that her PCP recommended.  Routine preventative health maintenance measures emphasized. Please refer to After Visit Summary for other counseling recommendations.   Return in about 1 year (around 12/27/2018).   Total face-to-face time with patient: 25 minutes.  Over 50% of encounter was spent on counseling and coordination of care.  Nolene BernheimERRI BURLESON, RN, MSN, NP-BC Nurse Practitioner, [facility], [facility] 12/27/2017 5:15 PM

## 2018-01-08 ENCOUNTER — Ambulatory Visit: Payer: Medicare Other | Admitting: Registered"

## 2018-01-10 DIAGNOSIS — H1013 Acute atopic conjunctivitis, bilateral: Secondary | ICD-10-CM | POA: Diagnosis not present

## 2018-01-11 ENCOUNTER — Ambulatory Visit: Payer: Medicare Other | Admitting: Registered"

## 2018-01-16 ENCOUNTER — Telehealth: Payer: Self-pay | Admitting: Neurology

## 2018-01-16 ENCOUNTER — Other Ambulatory Visit: Payer: Self-pay | Admitting: Neurology

## 2018-01-16 MED ORDER — DIAZEPAM 10 MG PO TABS: 20.0000 mg | ORAL_TABLET | Freq: Three times a day (TID) | ORAL | 3 refills | Status: DC

## 2018-01-16 NOTE — Telephone Encounter (Signed)
Pt states on Monday the 28th she will be out of her diazepam (VALIUM) 10 MG tablet, she wants to know if this should be filled through her workers comp or [facility] .  pt is asking to be called

## 2018-01-16 NOTE — Addendum Note (Signed)
Addended by: Enedina FinnerMILLIKAN, Demeka Sutter P on: 01/16/2018 04:33 PM   Modules accepted: Orders

## 2018-01-17 DIAGNOSIS — H1013 Acute atopic conjunctivitis, bilateral: Secondary | ICD-10-CM | POA: Diagnosis not present

## 2018-01-18 ENCOUNTER — Inpatient Hospital Stay: Payer: Medicare Other

## 2018-01-18 ENCOUNTER — Inpatient Hospital Stay: Payer: Medicare Other | Admitting: Hematology & Oncology

## 2018-01-21 ENCOUNTER — Encounter: Payer: Self-pay | Admitting: Registered"

## 2018-01-21 ENCOUNTER — Encounter: Payer: Medicare Other | Attending: Internal Medicine | Admitting: Registered"

## 2018-01-21 DIAGNOSIS — E119 Type 2 diabetes mellitus without complications: Secondary | ICD-10-CM

## 2018-01-21 DIAGNOSIS — E1165 Type 2 diabetes mellitus with hyperglycemia: Secondary | ICD-10-CM | POA: Diagnosis not present

## 2018-01-21 DIAGNOSIS — Z713 Dietary counseling and surveillance: Secondary | ICD-10-CM | POA: Diagnosis not present

## 2018-01-21 NOTE — Progress Notes (Signed)
Diabetes Self-Management Education  Visit Type: First/Initial  Appt. Start Time: 1130 Appt. End Time: 1230  01/21/2018  [name], identified by name and date of birth, is a 52 y.o. female with a diagnosis of Diabetes: Type 2.   ASSESSMENT Patient states she has not had DM education, but states she has experience taking care of her parents including giving them insulin shots. At this visit patient was having trouble with her eyes and difficulty seeing the forms to fill out her paperwork. Patient states her eye issues are due to allergies and had recently been to the ophthalmologist for treatment.  Patient states her doctor told her to check her blood sugar before and after she eats, but she is not adjusting medication or food with this information. Patient states she checks FBG before taking prednisone 242-272 mg/dL, takes the predisone and eats breakfast and checks immediately after eating and BG goes down ~20 pts. After lunch and dinner can go into mid 300s. Patient states she had a craving for sweettarts and her BG went up to 500.  Patient states she falls asleep watching TV and then her sleep is disturbed by having to get up to use the bathroom 2-3x per night. Patient states she drinks a lot, sometimes for thirst, but sometimes due to dry mouth 2/2 medication. Patient reports taking frequent naps during the day and states if she were home now, she would be sleeping. Pt also reports having acid reflux and a hiatal hernia.  Patient ambulates slowly with a cane which she states she uses for balance. Patient states she almost fell this morning standing up and putting on shoe. Patient reports she just finished her series of PT appointments to work on balance and plans to keep doing the exercises they gave her. Patient states her doctor suggested walking on a treadmill and she plans to start soon at her apartment gym complex. Patient states she walks to her mailbox which is about a 20 min  round trip for her. From her description of the distance RD believes it equals about a block.  RD spent most of visit discussing balanced eating, a few specific food products which she consumes and the importance of getting adequate sleep. If vision is better at follow-up RD may use handouts to explain monitoring BG and adjust meals to help control BG  Diabetes Self-Management Education - 01/21/18 1143      Visit Information   Visit Type  First/Initial      Initial Visit   Diabetes Type  Type 2    Are you taking your medications as prescribed?  No    Date Diagnosed  11/2017      Health Coping   How would you rate your overall health?  Good      Psychosocial Assessment   Patient Belief/Attitude about Diabetes  Motivated to manage diabetes    How often do you need to have someone help you when you read instructions, pamphlets, or other written materials from your doctor or pharmacy?  1 - Never    What is the last grade level you completed in school?  some college      Complications   Last HgB A1C per patient/outside source  12.3 % 11/2017    How often do you check your blood sugar?  1-2 times/day    Have you had a dilated eye exam in the past 12 months?  Yes    Have you had a dental exam in the past  12 months?  No    Are you checking your feet?  Yes    How many days per week are you checking your feet?  7      Dietary Intake   Breakfast  instant plain oatmeal OR multigrain cheerios w bran flakes OR scrambled egg sandwich    Snack (morning)  3 raw carrots OR brussle sprouts    Lunch  whole wheat bread, banana sandwich, pepper, mayo, coke zero    Snack (afternoon)  bologna sandwich,     Dinner  none OR frozen meals broccoli, chicken, (checks CHO)     Snack (evening)  none OR cereal in middle of night.    Beverage(s)  water, V8 splash, coffee powdered creamer, tea with a little sugar, reg sprite 1/3 bottle      Exercise   Exercise Type  Light (walking / raking leaves)    How many  days per week to you exercise?  7    How many minutes per day do you exercise?  30    Total minutes per week of exercise  210      Patient Education   Previous Diabetes Education  No    Nutrition management   Role of diet in the treatment of diabetes and the relationship between the three main macronutrients and blood glucose level;Food label reading, portion sizes and measuring food.;Carbohydrate counting    Monitoring  Identified appropriate SMBG and/or A1C goals.      Individualized Goals (developed by patient)   Nutrition  General guidelines for healthy choices and portions discussed      Outcomes   Expected Outcomes  Demonstrated interest in learning. Expect positive outcomes    Future DMSE  4-6 wks    Program Status  Not Completed     Individualized Plan for Diabetes Self-Management Training:   Learning Objective:  Patient will have a greater understanding of diabetes self-management. Patient education plan is to attend individual and/or group sessions per assessed needs and concerns.   Plan:  Aim to eat balanced meals Eat smaller portions of carbohydrates Check blood sugar 2 hours after a meal instead of right after eating Use a night light for safety when getting up to use the bathroom  Expected Outcomes:  Demonstrated interest in learning. Expect positive outcomes  Education material provided: none  If problems or questions, patient to contact team via:  Phone   Future DSME appointment: 4-6 wks

## 2018-01-24 NOTE — Telephone Encounter (Signed)
ERROR

## 2018-02-04 ENCOUNTER — Ambulatory Visit: Payer: Medicare Other | Admitting: Hematology & Oncology

## 2018-02-04 ENCOUNTER — Other Ambulatory Visit: Payer: Medicare Other

## 2018-02-08 ENCOUNTER — Ambulatory Visit (INDEPENDENT_AMBULATORY_CARE_PROVIDER_SITE_OTHER): Payer: Medicare Other

## 2018-02-08 ENCOUNTER — Ambulatory Visit (HOSPITAL_COMMUNITY)
Admission: EM | Admit: 2018-02-08 | Discharge: 2018-02-08 | Disposition: A | Discharge status: Home / Self Care | Payer: Medicare Other | Attending: Family Medicine | Admitting: Family Medicine

## 2018-02-08 ENCOUNTER — Encounter (HOSPITAL_COMMUNITY): Payer: Self-pay | Admitting: Emergency Medicine

## 2018-02-08 DIAGNOSIS — R0789 Other chest pain: Secondary | ICD-10-CM | POA: Diagnosis not present

## 2018-02-08 DIAGNOSIS — R0781 Pleurodynia: Secondary | ICD-10-CM

## 2018-02-08 DIAGNOSIS — S20212A Contusion of left front wall of thorax, initial encounter: Secondary | ICD-10-CM | POA: Diagnosis not present

## 2018-02-08 DIAGNOSIS — S299XXA Unspecified injury of thorax, initial encounter: Secondary | ICD-10-CM | POA: Diagnosis not present

## 2018-02-08 DIAGNOSIS — W06XXXA Fall from bed, initial encounter: Secondary | ICD-10-CM

## 2018-02-08 DIAGNOSIS — R079 Chest pain, unspecified: Secondary | ICD-10-CM | POA: Diagnosis not present

## 2018-02-08 MED ORDER — TRAMADOL HCL 50 MG PO TABS: 50.0000 mg | ORAL_TABLET | Freq: Four times a day (QID) | ORAL | 0 refills | Status: DC | PRN

## 2018-02-08 NOTE — Discharge Instructions (Signed)
The x-ray did not show any rib fractures. Your pain will probably continue a few more days and therefore I'm giving you some pain medicine until the ribs heal.

## 2018-02-08 NOTE — ED Triage Notes (Signed)
PT C/O: reports she fell 5 days ago from a tall bed and hit her left shoulder on night stand and also reports pain on left side of rib cage  Pain increases w/acticity   DENIES: head inj/LOC  TAKING MEDS: Acetaminophen   A&O x4... NAD... Ambulatory ... Cane present upon arrival.

## 2018-02-08 NOTE — ED Provider Notes (Addendum)
[facility]   [id number] 02/08/18 Arrival Time: 1157   SUBJECTIVE:  Sherri SlimmerSamantha [name] is a 52 y.o. female who presents to the urgent care with complaint of having had a fall 5 days ago from a tall bed and hit her left shoulder on night stand and also reports pain on left side of rib cage  Pain increases w/acticity   DENIES: head inj/LOC  TAKING MEDS: Acetaminophen   Past Medical History:  Diagnosis Date  . Angioedema   . Anxiety   . Arthritis   . Asthma   . CAP (community acquired pneumonia) 08/29/2012  . Chronic back pain   . Chronic kidney disease   . Cocaine abuse (HCC)   . Depression   . Diarrhea   . DVT (deep venous thrombosis) (HCC)   . Environmental allergies   . Fall   . GERD (gastroesophageal reflux disease)   . Heart murmur    has been told once that she has a heart murmur, but has never had any problems  . Hiatal hernia 03/21/2013  . Lumbar herniated disc   . Peripheral vascular disease (HCC)   . Pernicious anemia 10/30/2011  . Postconcussion syndrome 12/30/2014  . Seasonal allergies   . Stiff person syndrome   . Urticaria    Family History  Problem Relation Age of Onset  . Cancer Mother   . Other Mother   . COPD Father   . Asthma Brother   . Cancer Brother        colon  . Heart attack Brother   . Seizures Brother    Social History   Socioeconomic History  . Marital status: Divorced    Spouse name: Not on file  . Number of children: 1  . Years of education: college  . Highest education level: Not on file  Social Needs  . Financial resource strain: Not on file  . Food insecurity - worry: Not on file  . Food insecurity - inability: Not on file  . Transportation needs - medical: Not on file  . Transportation needs - non-medical: Not on file  Occupational History  . Occupation: disabled  Tobacco Use  . Smoking status: Never Smoker  . Smokeless tobacco: Never Used  . Tobacco comment: Never Used Tobacco  Substance and Sexual  Activity  . Alcohol use: Yes    Alcohol/week: 0.0 oz    Comment: Occasional  . Drug use: Yes    Types: Cocaine    Comment: in the last two weeks as of 08/08/17  . Sexual activity: Not on file  Other Topics Concern  . Not on file  Social History Narrative   Patient is right and left handed.   Patient drinks some caffeine occasionally.   Current Meds  Medication Sig  . albuterol (PROVENTIL HFA;VENTOLIN HFA) 108 (90 BASE) MCG/ACT inhaler Inhale 1 puff into the lungs every 6 (six) hours as needed. PRO AIR  . Ascorbic Acid (VITAMIN C) 1000 MG tablet Take 1,000 mg by mouth daily.  . baclofen (LIORESAL) 20 MG tablet TAKE ONE & ONE-HALF TABLETS BY MOUTH IN THE MORNING AND IN THE EVENING AND ONE AT  MIDDAY  . benzocaine (BABY ORAJEL) 7.5 % oral gel Use as directed in the mouth or throat 3 (three) times daily as needed for pain.  . cetirizine (ZYRTEC ALLERGY) 10 MG tablet Take 1 tablet (10 mg total) by mouth daily.  . Cholecalciferol (VITAMIN D3) 5000 units CAPS Take by mouth.  . Cranberry-Vitamin C-Vitamin E (CRANBERRY  PLUS VITAMIN C) 4200-20-3 MG-MG-UNIT CAPS Take by mouth.  . cyanocobalamin (,VITAMIN B-12,) 1000 MCG/ML injection Inject 1,000 mcg into the muscle every 3 (three) months.   . desloratadine (CLARINEX) 5 MG tablet Take 1 tablet (5 mg total) by mouth daily.  . diazepam (VALIUM) 10 MG tablet Take 2 tablets (20 mg total) by mouth 3 (three) times daily.  . diphenhydrAMINE (BENADRYL) 25 MG tablet Take 25 mg by mouth at bedtime as needed for itching or allergies.   . famotidine (PEPCID) 40 MG tablet Take 75 mg by mouth daily.  [name] FLUoxetine (PROZAC) 10 MG capsule Take 1 capsule (10 mg total) by mouth daily.  . fluticasone (FLONASE) 50 MCG/ACT nasal spray Place 2 sprays into both nostrils daily.  . Fluticasone-Salmeterol (ADVAIR) 250-50 MCG/DOSE AEPB Inhale 1 puff into the lungs 2 (two) times daily.   [name] gabapentin (NEURONTIN) 300 MG capsule 2 capsules in the morning and evening, one capsule  at midday  . glipiZIDE (GLUCOTROL) 5 MG tablet Take 1 tablet (5 mg total) by mouth daily before breakfast.  . Menthol, Topical Analgesic, 3.7 % GEL Apply topically as needed.  . metFORMIN (GLUCOPHAGE) 500 MG tablet Take 1 tablet (500 mg total) by mouth 2 (two) times daily with a meal.  . montelukast (SINGULAIR) 10 MG tablet Take 10 mg by mouth at bedtime.  [name] OVER THE COUNTER MEDICATION I B Guard  . predniSONE (DELTASONE) 10 MG tablet Take 10 mg by mouth daily with breakfast.   Allergies  Allergen Reactions  . Ibuprofen Other (See Comments)    Does not take due to hx of renal insufficiency "I have kidney disease"   . Tylenol [Acetaminophen] Hives    Cannot take large quantities  . Amoxicillin Diarrhea and Other (See Comments)  . Lemon Flavor Swelling    Severe Lip Swelling  FRUIT per pt.    . Amoxicillin Diarrhea      ROS: As per HPI, remainder of ROS negative.   OBJECTIVE:   Vitals:   02/08/18 1317  BP: 117/81  Pulse: 88  Resp: 20  Temp: 98.8 F (37.1 C)  TempSrc: Oral  SpO2: 96%     General appearance: alert; no distress Eyes: PERRL; EOMI; conjunctiva normal HENT: normocephalic; atraumatic;  oral mucosa normal Neck: supple Lungs: clear to auscultation bilaterally; very tender left ribs with lateral upper posterior chest ecchymosis Heart: regular rate and rhythm Back: no CVA tenderness Extremities: no cyanosis or edema; symmetrical with no gross deformities Skin: warm and dry Neurologic: normal gait; grossly normal Psychological: alert and cooperative; normal mood and affect      Labs:  Results for orders placed or performed during the hospital encounter of 11/23/17  Basic metabolic panel  Result Value Ref Range   Sodium 127 (L) 135 - 145 mmol/L   Potassium 4.2 3.5 - 5.1 mmol/L   Chloride 90 (L) 101 - 111 mmol/L   CO2 28 22 - 32 mmol/L   Glucose, Bld 477 (H) 65 - 99 mg/dL   BUN 13 6 - 20 mg/dL   Creatinine, Ser 2.95 0.44 - 1.00 mg/dL   Calcium 9.8  8.9 - 62.1 mg/dL   GFR calc non Af Amer >60 >60 mL/min   GFR calc Af Amer >60 >60 mL/min   Anion gap 9 5 - 15  Urinalysis, Routine w reflex microscopic  Result Value Ref Range   Color, Urine YELLOW YELLOW   APPearance CLEAR CLEAR   Specific Gravity, Urine 1.010 1.005 - 1.030  pH 7.5 5.0 - 8.0   Glucose, UA >=500 (A) NEGATIVE mg/dL   Hgb urine dipstick SMALL (A) NEGATIVE   Bilirubin Urine NEGATIVE NEGATIVE   Ketones, ur 15 (A) NEGATIVE mg/dL   Protein, ur NEGATIVE NEGATIVE mg/dL   Nitrite NEGATIVE NEGATIVE   Leukocytes, UA NEGATIVE NEGATIVE  CBC with Differential  Result Value Ref Range   WBC 7.8 4.0 - 10.5 K/uL   RBC 4.81 3.87 - 5.11 MIL/uL   Hemoglobin 13.6 12.0 - 15.0 g/dL   HCT 54.0 98.1 - 19.1 %   MCV 79.8 78.0 - 100.0 fL   MCH 28.3 26.0 - 34.0 pg   MCHC 35.4 30.0 - 36.0 g/dL   RDW 47.8 (L) 29.5 - 62.1 %   Platelets 234 150 - 400 K/uL   Neutrophils Relative % 72 %   Neutro Abs 5.7 1.7 - 7.7 K/uL   Lymphocytes Relative 23 %   Lymphs Abs 1.8 0.7 - 4.0 K/uL   Monocytes Relative 5 %   Monocytes Absolute 0.4 0.1 - 1.0 K/uL   Eosinophils Relative 0 %   Eosinophils Absolute 0.0 0.0 - 0.7 K/uL   Basophils Relative 0 %   Basophils Absolute 0.0 0.0 - 0.1 K/uL  Urinalysis, Microscopic (reflex)  Result Value Ref Range   RBC / HPF 0-5 0 - 5 RBC/hpf   WBC, UA NONE SEEN 0 - 5 WBC/hpf   Bacteria, UA RARE (A) NONE SEEN   Squamous Epithelial / LPF NONE SEEN NONE SEEN  POC CBG, ED  Result Value Ref Range   Glucose-Capillary 477 (H) 65 - 99 mg/dL   Comment 1 Notify RN   CBG monitoring, ED  Result Value Ref Range   Glucose-Capillary 382 (H) 65 - 99 mg/dL  POC CBG, ED  Result Value Ref Range   Glucose-Capillary 274 (H) 65 - 99 mg/dL    Labs Reviewed - No data to display  Dg Ribs Unilateral W/chest Left  Result Date: 02/08/2018 CLINICAL DATA:  Pain after trauma EXAM: LEFT RIBS AND CHEST - 3+ VIEW COMPARISON:  Chest CT August 29, 2012 and chest x-ray September 06, 2017  FINDINGS: No pneumothorax. A hiatal hernia in the left retrocardiac region is again seen and stable. No pulmonary nodules or masses. Mild atelectasis in the left base. No rib fractures are seen. No other acute abnormalities. IMPRESSION: No rib fractures identified. Stable left retrocardiac hiatal hernia. Electronically Signed   By: Gerome Sam III M.D   On: 02/08/2018 14:02       ASSESSMENT & PLAN:  1. Contusion of left chest wall, initial encounter     Meds ordered this encounter  Medications  . traMADol (ULTRAM) 50 MG tablet    Sig: Take 1 tablet (50 mg total) by mouth every 6 (six) hours as needed.    Dispense:  15 tablet    Refill:  0    Reviewed expectations re: course of current medical issues. Questions answered. Outlined signs and symptoms indicating need for more acute intervention. Patient verbalized understanding. After Visit Summary given.    Procedures:      Elvina Sidle, MD 02/08/18 1400    Elvina Sidle, MD 02/08/18 1421

## 2018-02-12 DIAGNOSIS — H1013 Acute atopic conjunctivitis, bilateral: Secondary | ICD-10-CM | POA: Diagnosis not present

## 2018-02-18 ENCOUNTER — Ambulatory Visit: Payer: Medicare Other | Admitting: Registered"

## 2018-02-19 DIAGNOSIS — F411 Generalized anxiety disorder: Secondary | ICD-10-CM | POA: Diagnosis not present

## 2018-02-19 DIAGNOSIS — F410 Panic disorder [episodic paroxysmal anxiety] without agoraphobia: Secondary | ICD-10-CM | POA: Diagnosis not present

## 2018-02-20 ENCOUNTER — Ambulatory Visit: Payer: Medicare Other | Admitting: Registered"

## 2018-03-04 ENCOUNTER — Other Ambulatory Visit: Payer: Medicare Other

## 2018-03-04 ENCOUNTER — Ambulatory Visit: Payer: Medicare Other | Admitting: Hematology & Oncology

## 2018-03-05 ENCOUNTER — Telehealth: Payer: Self-pay | Admitting: *Deleted

## 2018-03-05 MED ORDER — DIAZEPAM 10 MG PO TABS: 20.0000 mg | ORAL_TABLET | Freq: Three times a day (TID) | ORAL | 1 refills | Status: DC

## 2018-03-05 NOTE — Telephone Encounter (Signed)
Refilled until office visit with Dr. Anne Hahnwillis

## 2018-03-05 NOTE — Telephone Encounter (Signed)
Received refill request for rx diazepam from [facility]. Looks like MM,NP sent refill on 01/16/18 qty 180, 3 refills. I called [facility], spoke with Jonny RuizJohn. He advised they did not receive this refill. Will send to MM,NP for review to see if we can re-send refill.   Pt has f/u 03/27/18 with Dr. Anne HahnWillis. Followed for stiff person syndrome/chronic pain.

## 2018-03-10 ENCOUNTER — Encounter (HOSPITAL_COMMUNITY): Payer: Self-pay | Admitting: Emergency Medicine

## 2018-03-10 ENCOUNTER — Ambulatory Visit (HOSPITAL_COMMUNITY)
Admission: EM | Admit: 2018-03-10 | Discharge: 2018-03-10 | Disposition: A | Discharge status: Home / Self Care | Payer: Medicare Other | Attending: Physician Assistant | Admitting: Physician Assistant

## 2018-03-10 DIAGNOSIS — Z886 Allergy status to analgesic agent status: Secondary | ICD-10-CM | POA: Diagnosis not present

## 2018-03-10 DIAGNOSIS — N898 Other specified noninflammatory disorders of vagina: Secondary | ICD-10-CM | POA: Insufficient documentation

## 2018-03-10 NOTE — Discharge Instructions (Signed)
We will treat per your results.

## 2018-03-10 NOTE — ED Triage Notes (Signed)
Pt states "I think I might have a yeast infection, Wednesday I had a little itch, I used some tissue to scratch the itch. And now I have this odor with some discharge."

## 2018-03-10 NOTE — ED Provider Notes (Signed)
03/10/2018 3:33 PM   DOB: [birth year] / MRN: [id number]  SUBJECTIVE:  Sherri SlimmerSamantha [name] is a 52 y.o. female presenting for vaginal discharge.  Started as an itch however the itching went away.  She  has tried cranberry pills and this is not helped.  She is not getting better or worse.  Describes the discharge as yellow and malodorous however denies a fishy odor.  She tells me she is not sexually active at this time however did have sex with a man without protection about 2 weeks ago.  She is allergic to ibuprofen; tylenol [acetaminophen]; amoxicillin; lemon flavor; and amoxicillin.   She  has a past medical history of Angioedema, Anxiety, Arthritis, Asthma, CAP (community acquired pneumonia) (08/29/2012), Chronic back pain, Chronic kidney disease, Cocaine abuse (HCC), Depression, Diarrhea, DVT (deep venous thrombosis) (HCC), Environmental allergies, Fall, GERD (gastroesophageal reflux disease), Heart murmur, Hiatal hernia (03/21/2013), Lumbar herniated disc, Peripheral vascular disease (HCC), Pernicious anemia (10/30/2011), Postconcussion syndrome (12/30/2014), Seasonal allergies, Stiff person syndrome, and Urticaria.    She  reports that  has never smoked. she has never used smokeless tobacco. She reports that she drinks alcohol. She reports that she uses drugs. Drug: Cocaine. She  has no sexual activity history on file. The patient  has a past surgical history that includes inguinal hernia 1983 (1983); colonoscopy (07/09/15); Bunionectomy (Left); and Open reduction internal fixation (orif) distal radial fracture (Right, 08/16/2016).  Her family history includes Asthma in her brother; COPD in her father; Cancer in her brother and mother; Heart attack in her brother; Other in her mother; Seizures in her brother.  Review of Systems  Constitutional: Negative for chills, diaphoresis and fever.  Respiratory: Negative for shortness of breath.   Cardiovascular: Negative for chest pain, orthopnea and leg swelling.   Gastrointestinal: Negative for nausea.  Genitourinary: Negative for dysuria, flank pain, frequency, hematuria and urgency.  Skin: Negative for rash.  Neurological: Negative for dizziness.    OBJECTIVE:  BP 120/71   Pulse 84   Temp 98.5 F (36.9 C)   Resp 16   LMP 07/29/2012   SpO2 100%   Physical Exam  Constitutional: She is active.  Non-toxic appearance.  Cardiovascular: Normal rate, regular rhythm, S1 normal, S2 normal, normal heart sounds and intact distal pulses. Exam reveals no gallop, no friction rub and no decreased pulses.  No murmur heard. Pulmonary/Chest: Effort normal. No stridor. No tachypnea. No respiratory distress. She has no wheezes. She has no rales.  Abdominal: She exhibits no distension.  Musculoskeletal: She exhibits no edema.  Neurological: She is alert.  Skin: Skin is warm and dry. She is not diaphoretic. No pallor.    No results found for this or any previous visit (from the past 72 hour(s)).  No results found.  ASSESSMENT AND PLAN:  No orders of the defined types were placed in this encounter.    Vaginal discharge: Patient with nonspecific symptoms.  Last sexual encounter was 2 weeks ago without protection.  She needs STD screening today.  I will check for gonorrhea, chlamydia, bacterial vaginosis, yeast, trichomonas.     The patient is advised to call or return to clinic if she does not see an improvement in symptoms, or to seek the care of the closest emergency department if she worsens with the above plan.   Deliah BostonMichael Clark, MHS, PA-C 03/10/2018 3:33 PM    Ofilia Neaslark, Michael L, PA-C 03/10/18 1534

## 2018-03-10 NOTE — ED Notes (Signed)
Urine specimen obtained while patient in lobby. Specimen in lab 

## 2018-03-11 LAB — CERVICOVAGINAL ANCILLARY ONLY
Bacterial vaginitis: NEGATIVE
Candida vaginitis: NEGATIVE
Chlamydia: NEGATIVE
Neisseria Gonorrhea: NEGATIVE
Trichomonas: POSITIVE — AB

## 2018-03-13 ENCOUNTER — Telehealth (HOSPITAL_COMMUNITY): Payer: Self-pay | Admitting: Internal Medicine

## 2018-03-13 MED ORDER — METRONIDAZOLE 500 MG PO TABS: 500.0000 mg | ORAL_TABLET | Freq: Two times a day (BID) | ORAL | 0 refills | Status: DC

## 2018-03-13 NOTE — Telephone Encounter (Signed)
Clinical staff, please let patient know that test for trichomonas was positive.  Rx metronidazole was sent to the pharmacy of record, [facility] [address] [facility] [address]  Please refrain from sexual intercourse for 7 days to give the medicine time to work.  Sexual partners need to be notified and tested/treated.  Condoms may reduce risk of reinfection.  Recheck or followup with PCP for further evaluation if symptoms are not improving.  LM

## 2018-03-15 ENCOUNTER — Inpatient Hospital Stay (HOSPITAL_BASED_OUTPATIENT_CLINIC_OR_DEPARTMENT_OTHER): Payer: Medicare Other | Admitting: Family

## 2018-03-15 ENCOUNTER — Inpatient Hospital Stay: Payer: Medicare Other | Attending: Hematology & Oncology

## 2018-03-15 ENCOUNTER — Inpatient Hospital Stay: Payer: Medicare Other

## 2018-03-15 ENCOUNTER — Other Ambulatory Visit: Payer: Self-pay

## 2018-03-15 VITALS — BP 113/77 | HR 68 | Temp 98.5°F | Resp 16 | Wt 137.0 lb

## 2018-03-15 DIAGNOSIS — R5383 Other fatigue: Secondary | ICD-10-CM | POA: Diagnosis not present

## 2018-03-15 DIAGNOSIS — Z86718 Personal history of other venous thrombosis and embolism: Secondary | ICD-10-CM | POA: Diagnosis not present

## 2018-03-15 DIAGNOSIS — Z886 Allergy status to analgesic agent status: Secondary | ICD-10-CM

## 2018-03-15 DIAGNOSIS — Z880 Allergy status to penicillin: Secondary | ICD-10-CM | POA: Insufficient documentation

## 2018-03-15 DIAGNOSIS — D508 Other iron deficiency anemias: Secondary | ICD-10-CM

## 2018-03-15 DIAGNOSIS — Z79899 Other long term (current) drug therapy: Secondary | ICD-10-CM | POA: Diagnosis not present

## 2018-03-15 DIAGNOSIS — E538 Deficiency of other specified B group vitamins: Secondary | ICD-10-CM

## 2018-03-15 DIAGNOSIS — Z7984 Long term (current) use of oral hypoglycemic drugs: Secondary | ICD-10-CM | POA: Diagnosis not present

## 2018-03-15 DIAGNOSIS — E119 Type 2 diabetes mellitus without complications: Secondary | ICD-10-CM | POA: Diagnosis not present

## 2018-03-15 DIAGNOSIS — D510 Vitamin B12 deficiency anemia due to intrinsic factor deficiency: Secondary | ICD-10-CM

## 2018-03-15 DIAGNOSIS — Z9181 History of falling: Secondary | ICD-10-CM | POA: Insufficient documentation

## 2018-03-15 DIAGNOSIS — D509 Iron deficiency anemia, unspecified: Secondary | ICD-10-CM

## 2018-03-15 DIAGNOSIS — D500 Iron deficiency anemia secondary to blood loss (chronic): Secondary | ICD-10-CM

## 2018-03-15 LAB — CBC WITH DIFFERENTIAL (CANCER CENTER ONLY)
BASOS PCT: 0 %
Basophils Absolute: 0 10*3/uL (ref 0.0–0.1)
EOS ABS: 0 10*3/uL (ref 0.0–0.5)
Eosinophils Relative: 0 %
HCT: 36.8 % (ref 34.8–46.6)
Hemoglobin: 12.3 g/dL (ref 11.6–15.9)
LYMPHS ABS: 1.7 10*3/uL (ref 0.9–3.3)
Lymphocytes Relative: 25 %
MCH: 28.7 pg (ref 26.0–34.0)
MCHC: 33.4 g/dL (ref 32.0–36.0)
MCV: 86 fL (ref 81.0–101.0)
Monocytes Absolute: 0.3 10*3/uL (ref 0.1–0.9)
Monocytes Relative: 5 %
Neutro Abs: 4.6 10*3/uL (ref 1.5–6.5)
Neutrophils Relative %: 70 %
PLATELETS: 272 10*3/uL (ref 145–400)
RBC: 4.28 MIL/uL (ref 3.70–5.32)
RDW: 11.8 % (ref 11.1–15.7)
WBC: 6.6 10*3/uL (ref 3.9–10.0)

## 2018-03-15 LAB — CMP (CANCER CENTER ONLY)
ALK PHOS: 111 U/L — AB (ref 26–84)
ALT: 16 U/L (ref 10–47)
AST: 17 U/L (ref 11–38)
Albumin: 3.7 g/dL (ref 3.5–5.0)
Anion gap: 13 (ref 5–15)
BUN: 12 mg/dL (ref 7–22)
CALCIUM: 10.3 mg/dL (ref 8.0–10.3)
CO2: 30 mmol/L (ref 18–33)
Chloride: 103 mmol/L (ref 98–108)
Creatinine: 0.9 mg/dL (ref 0.60–1.20)
Glucose, Bld: 183 mg/dL — ABNORMAL HIGH (ref 73–118)
Potassium: 4.4 mmol/L (ref 3.3–4.7)
SODIUM: 146 mmol/L — AB (ref 128–145)
Total Bilirubin: 0.4 mg/dL (ref 0.2–1.6)
Total Protein: 7.4 g/dL (ref 6.4–8.1)

## 2018-03-15 LAB — VITAMIN B12: Vitamin B-12: 315 pg/mL (ref 180–914)

## 2018-03-15 MED ORDER — CYANOCOBALAMIN 1000 MCG/ML IJ SOLN
INTRAMUSCULAR | Status: AC
  Filled 2018-03-15: qty 1

## 2018-03-15 MED ORDER — CYANOCOBALAMIN 1000 MCG/ML IJ SOLN
1000.0000 ug | Freq: Once | INTRAMUSCULAR | Status: AC
  Administered 2018-03-15: 1000 ug via INTRAMUSCULAR

## 2018-03-15 NOTE — Progress Notes (Signed)
Hematology and Oncology Follow Up Visit  Chinmayi Rumer [id number] 1966/10/16 52 y.o. 03/15/2018   Principle Diagnosis:  1. Pernicious anemia - anti-intrinsic factor antibodies.  2. Intermittent iron-deficiency anemia.  3. 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  4. Diabetes mellitus -new diagnosis on 11/23/2017  Current Therapy:   1. Vitamin B12, 1 mg IM q.4 months.  2. IV iron as indicated - last received in January 2018   Interim History:  Ms. Falcon is here today for follow-up. She is having some persistent fatigue. She is needing to nap more often.  She has not had any issue with frequent infection. No fever, chills, n/v, cough, rash, dizziness, SOB, chest pain, palpitations, abdominal pain or changes in bowel habits. She is now on metformin and glipizide. She states that her blood sugars are better controlled but that she is no always taking her glipizide every day.  The frequent urination has improved.  She had a fall at home and bruised her left rib.  No swelling, numbness or tingling in her extremities. Her appetite comes and goes. She is staying well hydrated. Her weight is stable.  No episodes of bleeding, no bruising or petechiae. No lymphadenopathy found on exam.   ECOG Performance Status: 1 - Symptomatic but completely ambulatory  Medications:  Allergies as of 03/15/2018      Reactions   Ibuprofen Other (See Comments)   Does not take due to hx of renal insufficiency "I have kidney disease"    Tylenol [acetaminophen] Hives   Cannot take large quantities   Amoxicillin Diarrhea, Other (See Comments)   Lemon Flavor Swelling   Severe Lip Swelling  FRUIT per pt.     Amoxicillin Diarrhea      Medication List        Accurate as of 03/15/18  3:11 PM. Always use your most recent med list.          albuterol 108 (90 Base) MCG/ACT inhaler Commonly known as:  PROVENTIL HFA;VENTOLIN HFA Inhale 1 puff into the lungs every 6 (six) hours as needed. PRO  AIR   baclofen 20 MG tablet Commonly known as:  LIORESAL TAKE ONE & ONE-HALF TABLETS BY MOUTH IN THE MORNING AND IN THE EVENING AND ONE AT  MIDDAY   benzocaine 7.5 % oral gel Commonly known as:  BABY ORAJEL Use as directed in the mouth or throat 3 (three) times daily as needed for pain.   cetirizine 10 MG tablet Commonly known as:  ZYRTEC ALLERGY Take 1 tablet (10 mg total) by mouth daily.   CRANBERRY PLUS VITAMIN C 4200-20-3 MG-MG-UNIT Caps Generic drug:  Cranberry-Vitamin C-Vitamin E Take by mouth.   cyanocobalamin 1000 MCG/ML injection Commonly known as:  (VITAMIN B-12) Inject 1,000 mcg into the muscle every 3 (three) months.   desloratadine 5 MG tablet Commonly known as:  CLARINEX Take 1 tablet (5 mg total) by mouth daily.   diazepam 10 MG tablet Commonly known as:  VALIUM Take 2 tablets (20 mg total) by mouth 3 (three) times daily.   diphenhydrAMINE 25 MG tablet Commonly known as:  BENADRYL Take 25 mg by mouth at bedtime as needed for itching or allergies.   EPIPEN 2-PAK 0.3 mg/0.3 mL Soaj injection Generic drug:  EPINEPHrine USE AS DIRECTED FOR  SEVERE  ALLERGIC  REACTIONS   famotidine 40 MG tablet Commonly known as:  PEPCID Take 75 mg by mouth daily.   FLUoxetine 10 MG capsule Commonly known as:  PROZAC Take 1 capsule (  10 mg total) by mouth daily.   Fluticasone-Salmeterol 250-50 MCG/DOSE Aepb Commonly known as:  ADVAIR Inhale 1 puff into the lungs 2 (two) times daily.   gabapentin 300 MG capsule Commonly known as:  NEURONTIN 2 capsules in the morning and evening, one capsule at midday   glipiZIDE 5 MG tablet Commonly known as:  GLUCOTROL Take 1 tablet (5 mg total) by mouth daily before breakfast.   Menthol (Topical Analgesic) 3.7 % Gel Apply topically as needed.   metFORMIN 500 MG tablet Commonly known as:  GLUCOPHAGE Take 1 tablet (500 mg total) by mouth 2 (two) times daily with a meal.   metroNIDAZOLE 500 MG tablet Commonly known as:   FLAGYL Take 1 tablet (500 mg total) by mouth 2 (two) times daily. For positive trichomonas test   neomycin-polymyxin-dexameth 0.1 % Oint Commonly known as:  MAXITROL 1 application.   olopatadine 0.1 % ophthalmic solution Commonly known as:  PATANOL INSTILL 1 DROP BY OPHTHALMIC ROUTE 2 TIMES EVERY DAY INTO BOTH EYES FOR 1 WEEK.   OVER THE COUNTER MEDICATION I B Guard   predniSONE 10 MG tablet Commonly known as:  DELTASONE Take 10 mg by mouth daily with breakfast.   vitamin C 1000 MG tablet Take 1,000 mg by mouth daily.   Vitamin D3 5000 units Caps Take by mouth.       Allergies:  Allergies  Allergen Reactions  . Ibuprofen Other (See Comments)    Does not take due to hx of renal insufficiency "I have kidney disease"   . Tylenol [Acetaminophen] Hives    Cannot take large quantities  . Amoxicillin Diarrhea and Other (See Comments)  . Lemon Flavor Swelling    Severe Lip Swelling  FRUIT per pt.    . Amoxicillin Diarrhea    Past Medical History, Surgical history, Social history, and Family History were reviewed and updated.  Review of Systems: All other 10 point review of systems is negative.   Physical Exam:  weight is 137 lb (62.1 kg). Her oral temperature is 98.5 F (36.9 C). Her blood pressure is 113/77 and her pulse is 68. Her respiration is 16 and oxygen saturation is 100%.   Wt Readings from Last 3 Encounters:  03/15/18 137 lb (62.1 kg)  12/27/17 151 lb 6.4 oz (68.7 kg)  11/23/17 155 lb (70.3 kg)    Ocular: Sclerae unicteric, pupils equal, round and reactive to light Ear-nose-throat: Oropharynx clear, dentition fair Lymphatic: No cervical, supraclavicular or axillary adenopathy Lungs no rales or rhonchi, good excursion bilaterally Heart regular rate and rhythm, no murmur appreciated Abd soft, nontender, positive bowel sounds, no liver or spleen tip palpated on exam, no fluid wave  MSK no focal spinal tenderness, no joint edema Neuro: non-focal,  well-oriented, appropriate affect Breasts: Deferred   Lab Results  Component Value Date   WBC 6.6 03/15/2018   HGB 13.6 11/23/2017   HCT 36.8 03/15/2018   MCV 86.0 03/15/2018   PLT 272 03/15/2018   Lab Results  Component Value Date   FERRITIN 1,458 (H) 11/23/2017   IRON 78 11/23/2017   TIBC 215 (L) 11/23/2017   UIBC 137 11/23/2017   IRONPCTSAT 36 11/23/2017   Lab Results  Component Value Date   RETICCTPCT 1.8 12/14/2015   RBC 4.28 03/15/2018   RETICCTABS 71.8 12/14/2015   Lab Results  Component Value Date   KPAFRELGTCHN 1.06 08/02/2011   LAMBDASER 1.49 08/02/2011   KAPLAMBRATIO 0.71 08/02/2011   Lab Results  Component Value Date  IGGSERUM 1180 08/02/2011   IGA 206 08/02/2011   IGMSERUM 198 08/02/2011   Lab Results  Component Value Date   TOTALPROTELP 6.6 08/02/2011   ALBUMINELP 57.1 08/02/2011   A1GS 4.1 08/02/2011   A2GS 9.6 08/02/2011   BETS 6.1 08/02/2011   BETA2SER 5.9 08/02/2011   GAMS 17.2 08/02/2011   MSPIKE NOT DET 08/02/2011   SPEI * 08/02/2011     Chemistry      Component Value Date/Time   NA 146 (H) 03/15/2018 1433   NA 136 11/23/2017 1147   NA 144 03/22/2017 0943   K 4.4 03/15/2018 1433   K 3.9 11/23/2017 1147   K 4.2 03/22/2017 0943   CL 103 03/15/2018 1433   CL 94 (L) 11/23/2017 1147   CO2 30 03/15/2018 1433   CO2 30 11/23/2017 1147   CO2 29 03/22/2017 0943   BUN 12 03/15/2018 1433   BUN 10 11/23/2017 1147   BUN 11.1 03/22/2017 0943   CREATININE 0.90 03/15/2018 1433   CREATININE 1.0 11/23/2017 1147   CREATININE 1.0 03/22/2017 0943      Component Value Date/Time   CALCIUM 10.3 03/15/2018 1433   CALCIUM 10.1 11/23/2017 1147   CALCIUM 9.9 03/22/2017 0943   ALKPHOS 111 (H) 03/15/2018 1433   ALKPHOS 174 (H) 11/23/2017 1147   ALKPHOS 114 03/22/2017 0943   AST 17 03/15/2018 1433   AST 18 03/22/2017 0943   ALT 16 03/15/2018 1433   ALT 46 11/23/2017 1147   ALT 23 03/22/2017 0943   BILITOT 0.4 03/15/2018 1433   BILITOT 0.24  03/22/2017 0943      Impression and Plan: Ms. Gordner is a very pleasant 52 yo African American female with both pernicious and iron deficiency anemia. She is symptomatic with persistent fatigue.  She received B-12 today.  We will see what her iron studies show and bring her back in for infusion if needed.  We will plan to see her back in another 3 months for follow-up.  She will contact our office with and questions or concerns. We can certainly see her sooner if needed.   [name], NP 3/22/20193:11 PM

## 2018-03-15 NOTE — Patient Instructions (Signed)
Cyanocobalamin, Vitamin B12 injection What is this medicine? CYANOCOBALAMIN (sye an oh koe BAL a min) is a man made form of vitamin B12. Vitamin B12 is used in the growth of healthy blood cells, nerve cells, and proteins in the body. It also helps with the metabolism of fats and carbohydrates. This medicine is used to treat people who can not absorb vitamin B12. This medicine may be used for other purposes; ask your health care provider or pharmacist if you have questions. COMMON BRAND NAME(S): B-12 Compliance Kit, B-12 Injection Kit, Cyomin, LA-12, Nutri-Twelve, Physicians EZ Use B-12, Primabalt What should I tell my health care provider before I take this medicine? They need to know if you have any of these conditions: -kidney disease -Leber's disease -megaloblastic anemia -an unusual or allergic reaction to cyanocobalamin, cobalt, other medicines, foods, dyes, or preservatives -pregnant or trying to get pregnant -breast-feeding How should I use this medicine? This medicine is injected into a muscle or deeply under the skin. It is usually given by a health care professional in a clinic or doctor's office. However, your doctor may teach you how to inject yourself. Follow all instructions. Talk to your pediatrician regarding the use of this medicine in children. Special care may be needed. Overdosage: If you think you have taken too much of this medicine contact a poison control center or emergency room at once. NOTE: This medicine is only for you. Do not share this medicine with others. What if I miss a dose? If you are given your dose at a clinic or doctor's office, call to reschedule your appointment. If you give your own injections and you miss a dose, take it as soon as you can. If it is almost time for your next dose, take only that dose. Do not take double or extra doses. What may interact with this medicine? -colchicine -heavy alcohol intake This list may not describe all possible  interactions. Give your health care provider a list of all the medicines, herbs, non-prescription drugs, or dietary supplements you use. Also tell them if you smoke, drink alcohol, or use illegal drugs. Some items may interact with your medicine. What should I watch for while using this medicine? Visit your doctor or health care professional regularly. You may need blood work done while you are taking this medicine. You may need to follow a special diet. Talk to your doctor. Limit your alcohol intake and avoid smoking to get the best benefit. What side effects may I notice from receiving this medicine? Side effects that you should report to your doctor or health care professional as soon as possible: -allergic reactions like skin rash, itching or hives, swelling of the face, lips, or tongue -blue tint to skin -chest tightness, pain -difficulty breathing, wheezing -dizziness -red, swollen painful area on the leg Side effects that usually do not require medical attention (report to your doctor or health care professional if they continue or are bothersome): -diarrhea -headache This list may not describe all possible side effects. Call your doctor for medical advice about side effects. You may report side effects to FDA at 1-800-FDA-1088. Where should I keep my medicine? Keep out of the reach of children. Store at room temperature between 15 and 30 degrees C (59 and 85 degrees F). Protect from light. Throw away any unused medicine after the expiration date. NOTE: This sheet is a summary. It may not cover all possible information. If you have questions about this medicine, talk to your doctor, pharmacist, or   health care provider.  2018 Elsevier/Gold Standard (2008-03-23 22:10:20)  

## 2018-03-18 LAB — IRON AND TIBC
Iron: 55 ug/dL (ref 41–142)
Saturation Ratios: 25 % (ref 21–57)
TIBC: 221 ug/dL — AB (ref 236–444)
UIBC: 166 ug/dL

## 2018-03-18 LAB — FERRITIN: Ferritin: 714 ng/mL — ABNORMAL HIGH (ref 9–269)

## 2018-03-20 ENCOUNTER — Ambulatory Visit: Payer: Medicare Other | Admitting: Registered"

## 2018-03-27 ENCOUNTER — Encounter: Payer: Self-pay | Admitting: Neurology

## 2018-03-27 ENCOUNTER — Ambulatory Visit (INDEPENDENT_AMBULATORY_CARE_PROVIDER_SITE_OTHER): Payer: Medicare Other | Admitting: Neurology

## 2018-03-27 ENCOUNTER — Other Ambulatory Visit: Payer: Self-pay

## 2018-03-27 VITALS — BP 108/79 | HR 116 | Ht 63.0 in | Wt 138.5 lb

## 2018-03-27 DIAGNOSIS — G2582 Stiff-man syndrome: Secondary | ICD-10-CM | POA: Diagnosis not present

## 2018-03-27 DIAGNOSIS — Z79899 Other long term (current) drug therapy: Secondary | ICD-10-CM

## 2018-03-27 NOTE — Progress Notes (Signed)
Reason for visit: Stiff person syndrome  Sherri [name] is an 52 y.o. female  History of present illness:  Sherri [name] is a 52 year old right-handed black female with a history of stiff person syndrome.  The patient has had rhabdomyolysis, but she also has a history of cocaine abuse.  The patient has been treated with baclofen and diazepam.  The patient has done fairly well recently with her muscle spasms and muscle stiffness.  The patient does report some gait instability, she uses a cane for ambulation, she denies any recent falls.  She recently was diagnosed with diabetes in December 2018.  She has now undertaken a regimen of exercise and dieting.  She is back to operating a motor vehicle at this time after she had a DUI.  The patient comes back to this office for further evaluation.  Past Medical History:  Diagnosis Date  . Angioedema   . Anxiety   . Arthritis   . Asthma   . CAP (community acquired pneumonia) 08/29/2012  . Chronic back pain   . Chronic kidney disease   . Cocaine abuse (HCC)   . Depression   . Diarrhea   . DVT (deep venous thrombosis) (HCC)   . Environmental allergies   . Fall   . GERD (gastroesophageal reflux disease)   . Heart murmur    has been told once that she has a heart murmur, but has never had any problems  . Hiatal hernia 03/21/2013  . Lumbar herniated disc   . Peripheral vascular disease (HCC)   . Pernicious anemia 10/30/2011  . Postconcussion syndrome 12/30/2014  . Seasonal allergies   . Stiff person syndrome   . Urticaria     Past Surgical History:  Procedure Laterality Date  . BUNIONECTOMY Left   . colonoscopy  07/09/15  . inguinal hernia 1983  1983  . OPEN REDUCTION INTERNAL FIXATION (ORIF) DISTAL RADIAL FRACTURE Right 08/16/2016   Procedure: OPEN REDUCTION INTERNAL FIXATION (ORIF) DISTAL RADIAL FRACTURE;  Surgeon: Eldred MangesMark C Yates, MD;  Location: [facility] OR;  Service: Orthopedics;  Laterality: Right;    Family History  Problem Relation Age of  Onset  . Cancer Mother   . Other Mother   . COPD Father   . Asthma Brother   . Cancer Brother        colon  . Heart attack Brother   . Seizures Brother     Social history:  reports that she has never smoked. She has never used smokeless tobacco. She reports that she drinks alcohol. She reports that she has current or past drug history. Drug: Cocaine.    Allergies  Allergen Reactions  . Ibuprofen Other (See Comments)    Does not take due to hx of renal insufficiency "I have kidney disease"   . Tylenol [Acetaminophen] Hives    Cannot take large quantities  . Amoxicillin Diarrhea and Other (See Comments)  . Lemon Flavor Swelling    Severe Lip Swelling  FRUIT per pt.    . Amoxicillin Diarrhea    Medications:  Prior to Admission medications   Medication Sig Start Date End Date Taking? Authorizing Provider  albuterol (PROVENTIL HFA;VENTOLIN HFA) 108 (90 BASE) MCG/ACT inhaler Inhale 1 puff into the lungs every 6 (six) hours as needed. PRO AIR 06/20/13  Yes Young, Joni Fearslinton D, MD  Ascorbic Acid (VITAMIN C) 1000 MG tablet Take 1,000 mg by mouth daily.   Yes [provider]  baclofen (LIORESAL) 20 MG tablet TAKE ONE & ONE-HALF  TABLETS BY MOUTH IN THE MORNING AND IN THE EVENING AND ONE AT  MIDDAY 09/25/17  Yes Millikan, Megan, NP  benzocaine (BABY ORAJEL) 7.5 % oral gel Use as directed in the mouth or throat 3 (three) times daily as needed for pain. 08/08/17  Yes Yu, Amy V, PA-C  cetirizine (ZYRTEC ALLERGY) 10 MG tablet Take 1 tablet (10 mg total) by mouth daily. 08/24/17  Yes Yu, Amy V, PA-C  Cholecalciferol (VITAMIN D3) 5000 units CAPS Take by mouth.   Yes [provider]  Cranberry-Vitamin C-Vitamin E (CRANBERRY PLUS VITAMIN C) 4200-20-3 MG-MG-UNIT CAPS Take by mouth.   Yes [provider]  cyanocobalamin (,VITAMIN B-12,) 1000 MCG/ML injection Inject 1,000 mcg into the muscle every 3 (three) months.    Yes [provider]  desloratadine (CLARINEX) 5 MG  tablet Take 1 tablet (5 mg total) by mouth daily. 11/06/11  Yes Young, Joni Fears D, MD  diazepam (VALIUM) 10 MG tablet Take 2 tablets (20 mg total) by mouth 3 (three) times daily. 03/05/18  Yes Butch Penny, NP  diphenhydrAMINE (BENADRYL) 25 MG tablet Take 25 mg by mouth at bedtime as needed for itching or allergies.    Yes [provider]  EPIPEN 2-PAK 0.3 MG/0.3ML SOAJ injection USE AS DIRECTED FOR  SEVERE  ALLERGIC  REACTIONS 08/16/15  Yes Young, Joni Fears D, MD  famotidine (PEPCID) 40 MG tablet Take 75 mg by mouth daily.   Yes [provider]  FLUoxetine (PROZAC) 10 MG capsule Take 1 capsule (10 mg total) by mouth daily. 11/19/17  Yes York Spaniel, MD  Fluticasone-Salmeterol (ADVAIR) 250-50 MCG/DOSE AEPB Inhale 1 puff into the lungs 2 (two) times daily.  03/14/12  Yes Jetty Duhamel D, MD  gabapentin (NEURONTIN) 300 MG capsule 2 capsules in the morning and evening, one capsule at midday 09/25/17  Yes Butch Penny, NP  glipiZIDE (GLUCOTROL) 5 MG tablet Take 1 tablet (5 mg total) by mouth daily before breakfast. 11/23/17  Yes Cathren Laine, MD  Menthol, Topical Analgesic, 3.7 % GEL Apply topically as needed.   Yes [provider]  metFORMIN (GLUCOPHAGE) 500 MG tablet Take 1 tablet (500 mg total) by mouth 2 (two) times daily with a meal. 11/23/17  Yes Cathren Laine, MD  metroNIDAZOLE (FLAGYL) 500 MG tablet Take 1 tablet (500 mg total) by mouth 2 (two) times daily. For positive trichomonas test 03/13/18  Yes Isa Rankin, MD  olopatadine (PATANOL) 0.1 % ophthalmic solution INSTILL 1 DROP BY OPHTHALMIC ROUTE 2 TIMES EVERY DAY INTO BOTH EYES FOR 1 WEEK. 02/13/18  Yes [provider]  OVER THE COUNTER MEDICATION I B Guard   Yes [provider]  predniSONE (DELTASONE) 10 MG tablet Take 10 mg by mouth daily with breakfast.   Yes [provider]    ROS:  Out of a complete 14 system review of symptoms, the patient complains only of the  following symptoms, and all other reviewed systems are negative.  Increased activity, change in diet   Blood pressure 108/79, pulse (!) 116, height 5\' 3"  (1.6 m), weight 138 lb 8 oz (62.8 kg), last menstrual period 07/29/2012.  Physical Exam  General: The patient is alert and cooperative at the time of the examination.  Skin: No significant peripheral edema is noted.   Neurologic Exam  Mental status: The patient is alert and oriented x 3 at the time of the examination. The patient has apparent normal recent and remote memory, with an apparently normal attention span and  concentration ability.   Cranial nerves: Facial symmetry is present. Speech is normal, no aphasia or dysarthria is noted. Extraocular movements are full. Visual fields are full.  Motor: The patient has good strength in all 4 extremities.  Sensory examination: Soft touch sensation is symmetric on the face, arms, and legs.  Coordination: The patient has good finger-nose-finger and heel-to-shin bilaterally.  Gait and station: The patient has a normal gait. Tandem gait is normal. Romberg is negative. No drift is seen.  The patient carries a cane with her.  When she walks, she carries the cane.  Reflexes: Deep tendon reflexes are symmetric.   Assessment/Plan:  1.  Stiff person syndrome  The patient is doing well at this point.  We will continue the gabapentin, Prozac, diazepam, and baclofen.  The patient will have a urine drug screen today.  The patient currently is on disability.  She reports a gait disturbance, but she is able to perform tandem gait quite easily.  The patient will follow-up in 6 months.  Marlan Palau MD 03/27/2018 3:36 PM  [facility] [address] [place], [place] [address]  Phone [phone] Fax [phone]

## 2018-04-01 LAB — COMPLIANCE DRUG ANALYSIS, UR

## 2018-04-09 DIAGNOSIS — H1013 Acute atopic conjunctivitis, bilateral: Secondary | ICD-10-CM | POA: Diagnosis not present

## 2018-04-11 ENCOUNTER — Other Ambulatory Visit: Payer: Self-pay | Admitting: Neurology

## 2018-04-11 DIAGNOSIS — G2582 Stiff-man syndrome: Secondary | ICD-10-CM | POA: Diagnosis not present

## 2018-04-11 DIAGNOSIS — Z1231 Encounter for screening mammogram for malignant neoplasm of breast: Secondary | ICD-10-CM | POA: Diagnosis not present

## 2018-04-11 DIAGNOSIS — G4700 Insomnia, unspecified: Secondary | ICD-10-CM | POA: Diagnosis not present

## 2018-04-11 DIAGNOSIS — E1165 Type 2 diabetes mellitus with hyperglycemia: Secondary | ICD-10-CM | POA: Diagnosis not present

## 2018-04-11 DIAGNOSIS — E559 Vitamin D deficiency, unspecified: Secondary | ICD-10-CM | POA: Diagnosis not present

## 2018-04-12 ENCOUNTER — Encounter: Payer: Self-pay | Admitting: *Deleted

## 2018-04-12 NOTE — Telephone Encounter (Signed)
Request in system.

## 2018-05-16 ENCOUNTER — Other Ambulatory Visit: Payer: Self-pay | Admitting: Neurology

## 2018-05-16 ENCOUNTER — Telehealth: Payer: Self-pay | Admitting: Neurology

## 2018-05-16 MED ORDER — DIAZEPAM 10 MG PO TABS: 20.0000 mg | ORAL_TABLET | Freq: Three times a day (TID) | ORAL | 1 refills | Status: DC

## 2018-05-16 MED ORDER — GABAPENTIN 300 MG PO CAPS: ORAL_CAPSULE | ORAL | 0 refills | Status: DC

## 2018-05-16 NOTE — Telephone Encounter (Signed)
A prescription for the diazepam has been sent in.

## 2018-05-16 NOTE — Telephone Encounter (Signed)
Pt called stating that she will need a refill for gabapentin (NEURONTIN) 300 MG capsule sent to [facility] to last her until 5/31. Stating that is when she will received the medication from [facility]

## 2018-05-16 NOTE — Telephone Encounter (Signed)
Pt request refill for diazepam (VALIUM) 10 MG tablet sent to [facility] 90 day supply.

## 2018-06-14 ENCOUNTER — Other Ambulatory Visit: Payer: Medicare Other

## 2018-06-14 ENCOUNTER — Ambulatory Visit: Payer: Medicare Other

## 2018-06-14 ENCOUNTER — Ambulatory Visit: Payer: Medicare Other | Admitting: Family

## 2018-06-21 ENCOUNTER — Ambulatory Visit: Payer: Medicare Other

## 2018-06-21 ENCOUNTER — Other Ambulatory Visit: Payer: Medicare Other

## 2018-06-21 ENCOUNTER — Ambulatory Visit: Payer: Medicare Other | Admitting: Family

## 2018-06-24 ENCOUNTER — Encounter: Payer: Self-pay | Admitting: Family

## 2018-06-24 ENCOUNTER — Inpatient Hospital Stay (HOSPITAL_BASED_OUTPATIENT_CLINIC_OR_DEPARTMENT_OTHER): Payer: Medicare Other | Admitting: Family

## 2018-06-24 ENCOUNTER — Other Ambulatory Visit: Payer: Self-pay

## 2018-06-24 ENCOUNTER — Inpatient Hospital Stay: Payer: Medicare Other

## 2018-06-24 ENCOUNTER — Inpatient Hospital Stay: Payer: Medicare Other | Attending: Hematology & Oncology

## 2018-06-24 VITALS — BP 100/62 | HR 72 | Temp 98.7°F | Resp 16 | Wt 131.0 lb

## 2018-06-24 DIAGNOSIS — D508 Other iron deficiency anemias: Secondary | ICD-10-CM | POA: Diagnosis not present

## 2018-06-24 DIAGNOSIS — Z79899 Other long term (current) drug therapy: Secondary | ICD-10-CM | POA: Insufficient documentation

## 2018-06-24 DIAGNOSIS — D509 Iron deficiency anemia, unspecified: Secondary | ICD-10-CM | POA: Diagnosis not present

## 2018-06-24 DIAGNOSIS — E119 Type 2 diabetes mellitus without complications: Secondary | ICD-10-CM | POA: Insufficient documentation

## 2018-06-24 DIAGNOSIS — Z86718 Personal history of other venous thrombosis and embolism: Secondary | ICD-10-CM | POA: Diagnosis not present

## 2018-06-24 DIAGNOSIS — Z7952 Long term (current) use of systemic steroids: Secondary | ICD-10-CM | POA: Insufficient documentation

## 2018-06-24 DIAGNOSIS — Z7984 Long term (current) use of oral hypoglycemic drugs: Secondary | ICD-10-CM | POA: Diagnosis not present

## 2018-06-24 DIAGNOSIS — E538 Deficiency of other specified B group vitamins: Secondary | ICD-10-CM

## 2018-06-24 DIAGNOSIS — R5383 Other fatigue: Secondary | ICD-10-CM | POA: Insufficient documentation

## 2018-06-24 DIAGNOSIS — D510 Vitamin B12 deficiency anemia due to intrinsic factor deficiency: Secondary | ICD-10-CM | POA: Diagnosis not present

## 2018-06-24 LAB — CBC WITH DIFFERENTIAL (CANCER CENTER ONLY)
BASOS ABS: 0 10*3/uL (ref 0.0–0.1)
BASOS PCT: 0 %
EOS ABS: 0 10*3/uL (ref 0.0–0.5)
EOS PCT: 0 %
HCT: 36.1 % (ref 34.8–46.6)
Hemoglobin: 11.7 g/dL (ref 11.6–15.9)
Lymphocytes Relative: 35 %
Lymphs Abs: 1.8 10*3/uL (ref 0.9–3.3)
MCH: 28.2 pg (ref 26.0–34.0)
MCHC: 32.4 g/dL (ref 32.0–36.0)
MCV: 87 fL (ref 81.0–101.0)
MONO ABS: 0.3 10*3/uL (ref 0.1–0.9)
Monocytes Relative: 5 %
Neutro Abs: 3.1 10*3/uL (ref 1.5–6.5)
Neutrophils Relative %: 60 %
PLATELETS: 263 10*3/uL (ref 145–400)
RBC: 4.15 MIL/uL (ref 3.70–5.32)
RDW: 12.9 % (ref 11.1–15.7)
WBC Count: 5.1 10*3/uL (ref 3.9–10.0)

## 2018-06-24 LAB — CMP (CANCER CENTER ONLY)
ALBUMIN: 4.1 g/dL (ref 3.5–5.0)
ALT: 9 U/L (ref 0–44)
AST: 13 U/L — AB (ref 15–41)
Alkaline Phosphatase: 82 U/L (ref 38–126)
Anion gap: 10 (ref 5–15)
BUN: 13 mg/dL (ref 6–20)
CHLORIDE: 108 mmol/L (ref 98–111)
CO2: 27 mmol/L (ref 22–32)
CREATININE: 1.09 mg/dL — AB (ref 0.44–1.00)
Calcium: 10.1 mg/dL (ref 8.9–10.3)
GFR, Est AFR Am: >60 mL/min (ref >60)
GFR, Estimated: 57 mL/min — ABNORMAL LOW (ref >60)
GLUCOSE: 142 mg/dL — AB (ref 70–99)
POTASSIUM: 4.3 mmol/L (ref 3.5–5.1)
SODIUM: 145 mmol/L (ref 135–145)
Total Bilirubin: <0.2 mg/dL — ABNORMAL LOW (ref 0.3–1.2)
Total Protein: 7.2 g/dL (ref 6.5–8.1)

## 2018-06-24 LAB — VITAMIN B12: Vitamin B-12: 273 pg/mL (ref 180–914)

## 2018-06-24 MED ORDER — CYANOCOBALAMIN 1000 MCG/ML IJ SOLN
INTRAMUSCULAR | Status: AC
  Filled 2018-06-24: qty 1

## 2018-06-24 MED ORDER — CYANOCOBALAMIN 1000 MCG/ML IJ SOLN
1000.0000 ug | Freq: Once | INTRAMUSCULAR | Status: AC
  Administered 2018-06-24: 1000 ug via INTRAMUSCULAR

## 2018-06-24 NOTE — Progress Notes (Signed)
Hematology and Oncology Follow Up Visit  Francess Mullen [id number] May 31, 1966 52 y.o. 06/24/2018   Principle Diagnosis:  1. Pernicious anemia - anti-intrinsic factor antibodies.  2. Intermittent iron-deficiency anemia.  3. 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 4.Diabetes mellitus - new diagnosis on 11/23/2017  Current Therapy:   1. Vitamin B12, 1 mg IM q.4 months.  2. IV iron as indicated - last received in January 2018    Interim History:  Ms. Lasure is here today for follow-up. She is feeling fatigued at times and napping as needed.  No fever, chills, n/v, cough, rash, dizziness, SOB, chest pain, palpitations, abdominal pain or changes in bowel or bladder habits.  No episodes of bleeding, no bruising or petechiae. No lymphadenopathy noted on exam.  No swelling, tenderness, numbness or tingling in her extremities. She denies any recent falls. No syncope.  He has maintained a good appetite and is staying well hydrated. Her weight is stable.   ECOG Performance Status: 1 - Symptomatic but completely ambulatory  Medications:  Allergies as of 06/24/2018      Reactions   Ibuprofen Other (See Comments)   Does not take due to hx of renal insufficiency "I have kidney disease"    Tylenol [acetaminophen] Hives   Cannot take large quantities   Amoxicillin Diarrhea, Other (See Comments)   Lemon Flavor Swelling   Severe Lip Swelling  FRUIT per pt.     Amoxicillin Diarrhea      Medication List        Accurate as of 06/24/18  3:01 PM. Always use your most recent med list.          albuterol 108 (90 Base) MCG/ACT inhaler Commonly known as:  PROVENTIL HFA;VENTOLIN HFA Inhale 1 puff into the lungs every 6 (six) hours as needed. PRO AIR   baclofen 20 MG tablet Commonly known as:  LIORESAL TAKE ONE & ONE-HALF TABLETS BY MOUTH IN THE MORNING AND IN THE EVENING AND ONE AT  MIDDAY   benzocaine 7.5 % oral gel Commonly known as:  BABY ORAJEL Use as directed in the  mouth or throat 3 (three) times daily as needed for pain.   cetirizine 10 MG tablet Commonly known as:  ZYRTEC ALLERGY Take 1 tablet (10 mg total) by mouth daily.   CRANBERRY PLUS VITAMIN C 4200-20-3 MG-MG-UNIT Caps Generic drug:  Cranberry-Vitamin C-Vitamin E Take by mouth.   cyanocobalamin 1000 MCG/ML injection Commonly known as:  (VITAMIN B-12) Inject 1,000 mcg into the muscle every 3 (three) months.   desloratadine 5 MG tablet Commonly known as:  CLARINEX Take 1 tablet (5 mg total) by mouth daily.   diazepam 10 MG tablet Commonly known as:  VALIUM Take 2 tablets (20 mg total) by mouth 3 (three) times daily.   diphenhydrAMINE 25 MG tablet Commonly known as:  BENADRYL Take 25 mg by mouth at bedtime as needed for itching or allergies.   EPIPEN 2-PAK 0.3 mg/0.3 mL Soaj injection Generic drug:  EPINEPHrine USE AS DIRECTED FOR  SEVERE  ALLERGIC  REACTIONS   famotidine 40 MG tablet Commonly known as:  PEPCID Take 75 mg by mouth daily.   FLUoxetine 10 MG capsule Commonly known as:  PROZAC Take 1 capsule (10 mg total) by mouth daily.   Fluticasone-Salmeterol 250-50 MCG/DOSE Aepb Commonly known as:  ADVAIR Inhale 1 puff into the lungs 2 (two) times daily.   gabapentin 300 MG capsule Commonly known as:  NEURONTIN 2 capsules in the morning and evening,  one capsule at midday   gabapentin 300 MG capsule Commonly known as:  NEURONTIN 2 capsules in the morning and evening, one capsule at midday   glipiZIDE 5 MG tablet Commonly known as:  GLUCOTROL Take 1 tablet (5 mg total) by mouth daily before breakfast.   Menthol (Topical Analgesic) 3.7 % Gel Apply topically as needed.   metFORMIN 500 MG tablet Commonly known as:  GLUCOPHAGE Take 1 tablet (500 mg total) by mouth 2 (two) times daily with a meal.   metroNIDAZOLE 500 MG tablet Commonly known as:  FLAGYL Take 1 tablet (500 mg total) by mouth 2 (two) times daily. For positive trichomonas test   olopatadine 0.1 %  ophthalmic solution Commonly known as:  PATANOL INSTILL 1 DROP BY OPHTHALMIC ROUTE 2 TIMES EVERY DAY INTO BOTH EYES FOR 1 WEEK.   OVER THE COUNTER MEDICATION I B Guard   predniSONE 10 MG tablet Commonly known as:  DELTASONE Take 10 mg by mouth daily with breakfast.   vitamin C 1000 MG tablet Take 1,000 mg by mouth daily.   Vitamin D3 5000 units Caps Take by mouth.       Allergies:  Allergies  Allergen Reactions  . Ibuprofen Other (See Comments)    Does not take due to hx of renal insufficiency "I have kidney disease"   . Tylenol [Acetaminophen] Hives    Cannot take large quantities  . Amoxicillin Diarrhea and Other (See Comments)  . Lemon Flavor Swelling    Severe Lip Swelling  FRUIT per pt.    . Amoxicillin Diarrhea    Past Medical History, Surgical history, Social history, and Family History were reviewed and updated.  Review of Systems: All other 10 point review of systems is negative.   Physical Exam:  weight is 131 lb (59.4 kg). Her oral temperature is 98.7 F (37.1 C). Her blood pressure is 100/62 and her pulse is 72. Her respiration is 16 and oxygen saturation is 100%.   Wt Readings from Last 3 Encounters:  06/24/18 131 lb (59.4 kg)  03/27/18 138 lb 8 oz (62.8 kg)  03/15/18 137 lb (62.1 kg)    Ocular: Sclerae unicteric, pupils equal, round and reactive to light Ear-nose-throat: Oropharynx clear, dentition fair Lymphatic: No cervical, supraclavicular or axillary adenopathy Lungs no rales or rhonchi, good excursion bilaterally Heart regular rate and rhythm, no murmur appreciated Abd soft, nontender, positive bowel sounds, no liver or spleen tip palpated on exam, no fluid wave  MSK no focal spinal tenderness, no joint edema Neuro: non-focal, well-oriented, appropriate affect Breasts: Deferred   Lab Results  Component Value Date   WBC 5.1 06/24/2018   HGB 11.7 06/24/2018   HCT 36.1 06/24/2018   MCV 87.0 06/24/2018   PLT 263 06/24/2018   Lab  Results  Component Value Date   FERRITIN 714 (H) 03/15/2018   IRON 55 03/15/2018   TIBC 221 (L) 03/15/2018   UIBC 166 03/15/2018   IRONPCTSAT 25 03/15/2018   Lab Results  Component Value Date   RETICCTPCT 1.8 12/14/2015   RBC 4.15 06/24/2018   RETICCTABS 71.8 12/14/2015   Lab Results  Component Value Date   KPAFRELGTCHN 1.06 08/02/2011   LAMBDASER 1.49 08/02/2011   KAPLAMBRATIO 0.71 08/02/2011   Lab Results  Component Value Date   IGGSERUM 1180 08/02/2011   IGA 206 08/02/2011   IGMSERUM 198 08/02/2011   Lab Results  Component Value Date   TOTALPROTELP 6.6 08/02/2011   ALBUMINELP 57.1 08/02/2011   A1GS 4.1 08/02/2011  A2GS 9.6 08/02/2011   BETS 6.1 08/02/2011   BETA2SER 5.9 08/02/2011   GAMS 17.2 08/02/2011   MSPIKE NOT DET 08/02/2011   SPEI * 08/02/2011     Chemistry      Component Value Date/Time   NA 146 (H) 03/15/2018 1433   NA 136 11/23/2017 1147   NA 144 03/22/2017 0943   K 4.4 03/15/2018 1433   K 3.9 11/23/2017 1147   K 4.2 03/22/2017 0943   CL 103 03/15/2018 1433   CL 94 (L) 11/23/2017 1147   CO2 30 03/15/2018 1433   CO2 30 11/23/2017 1147   CO2 29 03/22/2017 0943   BUN 12 03/15/2018 1433   BUN 10 11/23/2017 1147   BUN 11.1 03/22/2017 0943   CREATININE 0.90 03/15/2018 1433   CREATININE 1.0 11/23/2017 1147   CREATININE 1.0 03/22/2017 0943      Component Value Date/Time   CALCIUM 10.3 03/15/2018 1433   CALCIUM 10.1 11/23/2017 1147   CALCIUM 9.9 03/22/2017 0943   ALKPHOS 111 (H) 03/15/2018 1433   ALKPHOS 174 (H) 11/23/2017 1147   ALKPHOS 114 03/22/2017 0943   AST 17 03/15/2018 1433   AST 18 03/22/2017 0943   ALT 16 03/15/2018 1433   ALT 46 11/23/2017 1147   ALT 23 03/22/2017 0943   BILITOT 0.4 03/15/2018 1433   BILITOT 0.24 03/22/2017 0943      Impression and Plan: Ms. Garraway is a very pleasant African American female with both pernicious and iron deficiency anemias. She is symptomatic with fatigue.  We will see what her iron studies  show and bring her back in for infusion if needed.  She received B 12 today as planned.  We will plan to see her back in another 4 months for follow-up.  She will contact our office with any questions or concerns. We can certainly see her sooner if need be.   [name], NP 7/1/20193:01 PM

## 2018-06-25 LAB — IRON AND TIBC
Iron: 66 ug/dL (ref 41–142)
Saturation Ratios: 31 % (ref 21–57)
TIBC: 214 ug/dL — AB (ref 236–444)
UIBC: 148 ug/dL

## 2018-06-25 LAB — FERRITIN: Ferritin: 673 ng/mL — ABNORMAL HIGH (ref 11–307)

## 2018-06-29 ENCOUNTER — Other Ambulatory Visit: Payer: Self-pay | Admitting: Neurology

## 2018-07-10 ENCOUNTER — Encounter (HOSPITAL_COMMUNITY): Payer: Self-pay | Admitting: Emergency Medicine

## 2018-07-10 ENCOUNTER — Ambulatory Visit (INDEPENDENT_AMBULATORY_CARE_PROVIDER_SITE_OTHER): Payer: Medicare Other

## 2018-07-10 ENCOUNTER — Ambulatory Visit (HOSPITAL_COMMUNITY)
Admission: EM | Admit: 2018-07-10 | Discharge: 2018-07-10 | Disposition: A | Discharge status: Home / Self Care | Payer: Medicare Other | Attending: Internal Medicine | Admitting: Internal Medicine

## 2018-07-10 DIAGNOSIS — S63502A Unspecified sprain of left wrist, initial encounter: Secondary | ICD-10-CM | POA: Diagnosis not present

## 2018-07-10 DIAGNOSIS — W19XXXA Unspecified fall, initial encounter: Secondary | ICD-10-CM

## 2018-07-10 DIAGNOSIS — S6992XA Unspecified injury of left wrist, hand and finger(s), initial encounter: Secondary | ICD-10-CM | POA: Diagnosis not present

## 2018-07-10 DIAGNOSIS — Z8781 Personal history of (healed) traumatic fracture: Secondary | ICD-10-CM

## 2018-07-10 NOTE — Discharge Instructions (Addendum)
Please ice and wear wrist brace for support

## 2018-07-10 NOTE — ED Triage Notes (Signed)
Pt states she has a titanium plate in her L wrist due to injury two years ago, states she fell and landed on her L wrist, bent it back.

## 2018-07-11 NOTE — ED Provider Notes (Signed)
[facility]    CSN: [id number] Arrival date & time: 07/10/18  1933     History   Chief Complaint Chief Complaint  Patient presents with  . Wrist Pain    HPI Sherri [name] is a 52 y.o. female history of CKD, substance abuse presenting today for evaluation of left wrist injury.  Patient had ORIF of left distal radius and ulna.  She has a plate in her wrist.  She states that yesterday she fell and is since had increased pain and some bruising to her wrist.  She was concerned about her previous fracture and the plate being damaged from her fall.  She denies hitting head or LOC relation to the fall.  HPI  Past Medical History:  Diagnosis Date  . Angioedema   . Anxiety   . Arthritis   . Asthma   . CAP (community acquired pneumonia) 08/29/2012  . Chronic back pain   . Chronic kidney disease   . Cocaine abuse (HCC)   . Depression   . Diarrhea   . DVT (deep venous thrombosis) (HCC)   . Environmental allergies   . Fall   . GERD (gastroesophageal reflux disease)   . Heart murmur    has been told once that she has a heart murmur, but has never had any problems  . Hiatal hernia 03/21/2013  . Lumbar herniated disc   . Peripheral vascular disease (HCC)   . Pernicious anemia 10/30/2011  . Postconcussion syndrome 12/30/2014  . Seasonal allergies   . Stiff person syndrome   . Urticaria     Patient Active Problem List   Diagnosis Date Noted  . Newly diagnosed diabetes (HCC) 01/21/2018  . Depression 01/02/2017  . History of DVT (deep vein thrombosis) 01/02/2017  . Chronic pain syndrome 01/02/2017  . Cocaine abuse (HCC) 01/02/2017  . Dysphagia   . Adjustment insomnia   . Distal radius fracture, right 08/16/2016  . Postconcussion syndrome 12/30/2014  . Dizziness 12/18/2014  . Tremor 03/11/2014  . 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 Hiatal hernia 03/21/2013  . Anemia, iron deficiency 01/29/2013  . Spasm of muscle 09/02/2012  . Benzodiazepine withdrawal (HCC)  08/30/2012  . CAP (community acquired pneumonia) 08/29/2012  . Hypernatremia 08/28/2012  . Metabolic acidosis 08/28/2012  . Leukocytosis 08/28/2012  . UTI (lower urinary tract infection) 08/28/2012  . Anemia 08/28/2012  . Acute back pain 08/27/2012  . Rhabdomyolysis 08/27/2012  . ARF (acute renal failure) (HCC) 08/27/2012  . Hypokalemia 08/27/2012  . Asthma 08/27/2012  . Chronic back pain   . Pernicious anemia 10/30/2011  . DEGENERATIVE DISC DISEASE 08/18/2010  . DEEP VEIN THROMBOSIS/PHLEBITIS 07/28/2010  . Asthma with bronchitis 07/28/2010  . URTICARIA 07/28/2010    Past Surgical History:  Procedure Laterality Date  . BUNIONECTOMY Left   . colonoscopy  07/09/15  . inguinal hernia 1983  1983  . OPEN REDUCTION INTERNAL FIXATION (ORIF) DISTAL RADIAL FRACTURE Right 08/16/2016   Procedure: OPEN REDUCTION INTERNAL FIXATION (ORIF) DISTAL RADIAL FRACTURE;  Surgeon: Eldred Manges, MD;  Location: [facility];  Service: Orthopedics;  Laterality: Right;    OB History    Gravida  1   Para  1   Term  1   Preterm  0   AB  0   Living  1     SAB      TAB      Ectopic      Multiple      Live Births  Home Medications    Prior to Admission medications   Medication Sig Start Date End Date Taking? Authorizing Provider  albuterol (PROVENTIL HFA;VENTOLIN HFA) 108 (90 BASE) MCG/ACT inhaler Inhale 1 puff into the lungs every 6 (six) hours as needed. PRO AIR 06/20/13   Jetty Duhamel D, MD  Ascorbic Acid (VITAMIN C) 1000 MG tablet Take 1,000 mg by mouth daily.    [provider]  baclofen (LIORESAL) 20 MG tablet TAKE ONE & ONE-HALF TABLETS BY MOUTH IN THE MORNING AND IN THE EVENING AND ONE AT  MIDDAY 09/25/17   Butch Penny, NP  benzocaine (BABY ORAJEL) 7.5 % oral gel Use as directed in the mouth or throat 3 (three) times daily as needed for pain. 08/08/17   Cathie Hoops, Amy V, PA-C  cetirizine (ZYRTEC ALLERGY) 10 MG tablet Take 1 tablet (10 mg total) by mouth daily.  08/24/17   Belinda Fisher, PA-C  Cholecalciferol (VITAMIN D3) 5000 units CAPS Take by mouth.    [provider]  Cranberry-Vitamin C-Vitamin E (CRANBERRY PLUS VITAMIN C) 4200-20-3 MG-MG-UNIT CAPS Take by mouth.    [provider]  cyanocobalamin (,VITAMIN B-12,) 1000 MCG/ML injection Inject 1,000 mcg into the muscle every 3 (three) months.     [provider]  desloratadine (CLARINEX) 5 MG tablet Take 1 tablet (5 mg total) by mouth daily. 11/06/11   Waymon Budge, MD  diazepam (VALIUM) 10 MG tablet Take 2 tablets (20 mg total) by mouth 3 (three) times daily. 05/16/18   York Spaniel, MD  diphenhydrAMINE (BENADRYL) 25 MG tablet Take 25 mg by mouth at bedtime as needed for itching or allergies.     [provider]  EPIPEN 2-PAK 0.3 MG/0.3ML SOAJ injection USE AS DIRECTED FOR  SEVERE  ALLERGIC  REACTIONS 08/16/15   Jetty Duhamel D, MD  famotidine (PEPCID) 40 MG tablet Take 75 mg by mouth daily.    [provider]  FLUoxetine (PROZAC) 10 MG capsule TAKE 1 CAPSULE BY MOUTH  DAILY 07/01/18   York Spaniel, MD  Fluticasone-Salmeterol (ADVAIR) 250-50 MCG/DOSE AEPB Inhale 1 puff into the lungs 2 (two) times daily.  03/14/12   Waymon Budge, MD  gabapentin (NEURONTIN) 300 MG capsule 2 capsules in the morning and evening, one capsule at midday 09/25/17   Butch Penny, NP  gabapentin (NEURONTIN) 300 MG capsule 2 capsules in the morning and evening, one capsule at midday 05/16/18   York Spaniel, MD  glipiZIDE (GLUCOTROL) 5 MG tablet Take 1 tablet (5 mg total) by mouth daily before breakfast. 11/23/17   Cathren Laine, MD  Menthol, Topical Analgesic, 3.7 % GEL Apply topically as needed.    [provider]  metFORMIN (GLUCOPHAGE) 500 MG tablet Take 1 tablet (500 mg total) by mouth 2 (two) times daily with a meal. 11/23/17   Cathren Laine, MD  metroNIDAZOLE (FLAGYL) 500 MG tablet Take 1 tablet (500 mg total) by mouth 2 (two) times daily. For positive  trichomonas test 03/13/18   Isa Rankin, MD  olopatadine (PATANOL) 0.1 % ophthalmic solution INSTILL 1 DROP BY OPHTHALMIC ROUTE 2 TIMES EVERY DAY INTO BOTH EYES FOR 1 WEEK. 02/13/18   [provider]  OVER THE COUNTER MEDICATION I B Guard    [provider]  predniSONE (DELTASONE) 10 MG tablet Take 10 mg by mouth daily with breakfast.    [provider]    Family History Family History  Problem Relation Age of Onset  . Cancer Mother   .  Other Mother   . COPD Father   . Asthma Brother   . Cancer Brother        colon  . Heart attack Brother   . Seizures Brother     Social History Social History   Tobacco Use  . Smoking status: Never Smoker  . Smokeless tobacco: Never Used  . Tobacco comment: Never Used Tobacco  Substance Use Topics  . Alcohol use: Yes    Alcohol/week: 0.0 oz    Comment: Occasional  . Drug use: Yes    Types: Cocaine    Comment: in the last two weeks as of 08/08/17     Allergies   Ibuprofen; Tylenol [acetaminophen]; Amoxicillin; Lemon flavor; and Amoxicillin   Review of Systems Review of Systems  Constitutional: Negative for activity change, chills, diaphoresis and fatigue.  Eyes: Negative for photophobia and visual disturbance.  Respiratory: Negative for cough, chest tightness and shortness of breath.   Cardiovascular: Negative for chest pain and leg swelling.  Gastrointestinal: Negative for nausea and vomiting.  Musculoskeletal: Positive for arthralgias and myalgias. Negative for back pain, gait problem, neck pain and neck stiffness.  Skin: Negative for color change and wound.  Neurological: Negative for dizziness, weakness, light-headedness, numbness and headaches.     Physical Exam Triage Vital Signs ED Triage Vitals  Enc Vitals Group     BP 07/10/18 2005 107/64     Pulse Rate 07/10/18 2004 83     Resp 07/10/18 2004 16     Temp 07/10/18 2004 97.8 F (36.6 C)     Temp Source 07/10/18 2004 Temporal     SpO2  07/10/18 2004 99 %     Weight --      Height --      Head Circumference --      Peak Flow --      Pain Score --      Pain Loc --      Pain Edu? --      Excl. in GC? --    No data found.  Updated Vital Signs BP 107/64   Pulse 83   Temp 97.8 F (36.6 C) (Temporal)   Resp 16   LMP 07/29/2012   SpO2 99%   Visual Acuity Right Eye Distance:   Left Eye Distance:   Bilateral Distance:    Right Eye Near:   Left Eye Near:    Bilateral Near:     Physical Exam  Constitutional: She is oriented to person, place, and time. She appears well-developed and well-nourished.  No acute distress  HENT:  Head: Normocephalic and atraumatic.  Nose: Nose normal.  Eyes: Conjunctivae are normal.  Neck: Neck supple.  Cardiovascular: Normal rate.  Pulmonary/Chest: Effort normal. No respiratory distress.  Abdominal: She exhibits no distension.  Musculoskeletal: Normal range of motion.  Neurological: She is alert and oriented to person, place, and time.  Skin: Skin is warm and dry.  Small bruises to dorsal surface of left wrist, minimal swelling, tenderness to palpation over distal ulna and radius  Psychiatric: She has a normal mood and affect.  Nursing note and vitals reviewed.    UC Treatments / Results  Labs (all labs ordered are listed, but only abnormal results are displayed) Labs Reviewed - No data to display  EKG None  Radiology Dg Wrist Complete Left  Result Date: 07/10/2018 CLINICAL DATA:  52 year old female with a history of fall EXAM: LEFT WRIST - COMPLETE 3+ VIEW COMPARISON:  09/26/2016 FINDINGS: Surgical changes of plate  and screw fixation of distal radius. There has been interval healing of the fracture site. No hardware fracture. No acute fracture identified. Remote injury of the distal ulna. No focal soft tissue swelling. No radiopaque foreign body. Osteopenia. IMPRESSION: Negative for acute bony abnormality. Surgical changes of plate and screw fixation of distal radius,  with interval healing at the previous fracture site. Electronically Signed   By: Gilmer Mor D.O.   On: 07/10/2018 20:31    Procedures Procedures (including critical care time)  Medications Ordered in UC Medications - No data to display  Initial Impression / Assessment and Plan / UC Course  I have reviewed the triage vital signs and the nursing notes.  Pertinent labs & imaging results that were available during my care of the patient were reviewed by me and considered in my medical decision making (see chart for details).     No new fractures, hardware appears similar and still in place from previous surgery.  Likely knee sprain/contusion.  Will provide wrist brace.  Will avoid NSAIDs given kidney history.  Ice.  Follow-up with Dr. Ophelia Charter for further evaluation if pain persisting.Discussed strict return precautions. Patient verbalized understanding and is agreeable with plan.  Final Clinical Impressions(s) / UC Diagnoses   Final diagnoses:  Sprain of left wrist, initial encounter     Discharge Instructions     Please ice and wear wrist brace for support   ED Prescriptions    None     Controlled Substance Prescriptions [place] Controlled Substance Registry consulted? Not Applicable   Lew Dawes, [place] 07/11/18 1215

## 2018-07-19 ENCOUNTER — Encounter (INDEPENDENT_AMBULATORY_CARE_PROVIDER_SITE_OTHER): Payer: Self-pay | Admitting: Orthopaedic Surgery

## 2018-07-19 ENCOUNTER — Ambulatory Visit (INDEPENDENT_AMBULATORY_CARE_PROVIDER_SITE_OTHER): Payer: Medicare Other | Admitting: Orthopaedic Surgery

## 2018-07-19 VITALS — BP 91/51 | HR 79 | Ht 63.0 in | Wt 126.2 lb

## 2018-07-19 DIAGNOSIS — S60212A Contusion of left wrist, initial encounter: Secondary | ICD-10-CM | POA: Diagnosis not present

## 2018-07-19 NOTE — Progress Notes (Signed)
Office Visit Note   Patient: Sherri [name]           Date of Birth: 1966-11-25           MRN: [id number] Visit Date: 07/19/2018              Requested by: Arnette FeltsMoore, Janece [address] [place] [id number] PCP: Arnette FeltsMoore, Janece   Assessment & Plan: Visit Diagnoses:  1. Contusion of left wrist, initial encounter     Plan: Advised patient that wrist pain should gradually improve over the next week or 2.  She can come out of her wrist splint and work range of motion to make sure that she does not get stiff.  Only use the brace if she plans on doing a high amount of activity using her hand.  Follow-up in 4 weeks for recheck.  If she is doing well at the time of her next appointment she may cancel.  Can use ice off and on as needed.  No medications given today.  Follow-Up Instructions: Return in about 1 month (around 08/16/2018) for Recheck left wrist.   Orders:  No orders of the defined types were placed in this encounter.  No orders of the defined types were placed in this encounter.     Procedures: No procedures performed   Clinical Data: No additional findings.   Subjective: Chief Complaint  Patient presents with  . Left Wrist - Injury, Pain    HPI 52 year old white female comes in today with complaints of left wrist pain.  She is status post ORIF left distal radius fracture August 16, 2016 by Dr. Ophelia CharterYates.  July 09, 2018 she states that she fell backwards and flipped her arm behind her and hit the dorsal aspect of her wrist on the ground.  States that she has some soreness after this occurred.  She was seen at the most going urgent care the next day and had x-rays.  Report read negative for acute bony abnormality.  Surgical changes of plate and screw fixation of distal radius, with interval healing at the previous fracture site.  She was given a removable wrist brace.  States that she continues to have some soreness but this is improving. Review of Systems No  current cardiac pulmonary GI GU issues  Objective: Vital Signs: BP (!) 91/51 (BP Location: Right Arm, Patient Position: Sitting)   Pulse 79   Ht 5\' 3"  (1.6 m)   Wt 126 lb 3.2 oz (57.2 kg)   LMP 07/29/2012   BMI 22.36 kg/m   Physical Exam  Constitutional: She is oriented to person, place, and time. No distress.  HENT:  Head: Normocephalic and atraumatic.  Eyes: Pupils are equal, round, and reactive to light. EOM are normal.  Musculoskeletal:  Left wrist she has good range of motion.  She is minimally tender over the dorsal aspect.  No bruising or swelling.  Neurovascular intact.  Moves fingers well.  Good grip strength.  Neurological: She is alert and oriented to person, place, and time.  Skin: Skin is warm and dry.  Psychiatric: She has a normal mood and affect.    Ortho Exam  Specialty Comments:  No specialty comments available.  Imaging: No results found.   PMFS History: Patient Active Problem List   Diagnosis Date Noted  . Newly diagnosed diabetes (HCC) 01/21/2018  . Depression 01/02/2017  . History of DVT (deep vein thrombosis) 01/02/2017  . Chronic pain syndrome 01/02/2017  . Cocaine abuse (HCC) 01/02/2017  .  Dysphagia   . Adjustment insomnia   . Distal radius fracture, right 08/16/2016  . Postconcussion syndrome 12/30/2014  . Dizziness 12/18/2014  . Tremor 03/11/2014  . 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 Hiatal hernia 03/21/2013  . Anemia, iron deficiency 01/29/2013  . Spasm of muscle 09/02/2012  . Benzodiazepine withdrawal (HCC) 08/30/2012  . CAP (community acquired pneumonia) 08/29/2012  . Hypernatremia 08/28/2012  . Metabolic acidosis 08/28/2012  . Leukocytosis 08/28/2012  . UTI (lower urinary tract infection) 08/28/2012  . Anemia 08/28/2012  . Acute back pain 08/27/2012  . Rhabdomyolysis 08/27/2012  . ARF (acute renal failure) (HCC) 08/27/2012  . Hypokalemia 08/27/2012  . Asthma 08/27/2012  . Chronic back pain   . Pernicious anemia 10/30/2011    . DEGENERATIVE DISC DISEASE 08/18/2010  . DEEP VEIN THROMBOSIS/PHLEBITIS 07/28/2010  . Asthma with bronchitis 07/28/2010  . URTICARIA 07/28/2010   Past Medical History:  Diagnosis Date  . Angioedema   . Anxiety   . Arthritis   . Asthma   . CAP (community acquired pneumonia) 08/29/2012  . Chronic back pain   . Chronic kidney disease   . Cocaine abuse (HCC)   . Depression   . Diarrhea   . DVT (deep venous thrombosis) (HCC)   . Environmental allergies   . Fall   . GERD (gastroesophageal reflux disease)   . Heart murmur    has been told once that she has a heart murmur, but has never had any problems  . Hiatal hernia 03/21/2013  . Lumbar herniated disc   . Peripheral vascular disease (HCC)   . Pernicious anemia 10/30/2011  . Postconcussion syndrome 12/30/2014  . Seasonal allergies   . Stiff person syndrome   . Urticaria     Family History  Problem Relation Age of Onset  . Cancer Mother   . Other Mother   . COPD Father   . Asthma Brother   . Cancer Brother        colon  . Heart attack Brother   . Seizures Brother     Past Surgical History:  Procedure Laterality Date  . BUNIONECTOMY Left   . colonoscopy  07/09/15  . inguinal hernia 1983  1983  . OPEN REDUCTION INTERNAL FIXATION (ORIF) DISTAL RADIAL FRACTURE Right 08/16/2016   Procedure: OPEN REDUCTION INTERNAL FIXATION (ORIF) DISTAL RADIAL FRACTURE;  Surgeon: Eldred Manges, MD;  Location: [facility];  Service: Orthopedics;  Laterality: Right;   Social History   Occupational History  . Occupation: disabled  Tobacco Use  . Smoking status: Never Smoker  . Smokeless tobacco: Never Used  . Tobacco comment: Never Used Tobacco  Substance and Sexual Activity  . Alcohol use: Yes    Alcohol/week: 0.0 oz    Comment: Occasional  . Drug use: Yes    Types: Cocaine    Comment: in the last two weeks as of 08/08/17  . Sexual activity: Not on file

## 2018-08-16 ENCOUNTER — Encounter (INDEPENDENT_AMBULATORY_CARE_PROVIDER_SITE_OTHER): Payer: Self-pay | Admitting: Orthopaedic Surgery

## 2018-08-16 ENCOUNTER — Ambulatory Visit (INDEPENDENT_AMBULATORY_CARE_PROVIDER_SITE_OTHER): Payer: Medicare Other | Admitting: Orthopaedic Surgery

## 2018-08-16 VITALS — BP 99/64 | HR 80 | Ht 63.0 in | Wt 126.0 lb

## 2018-08-16 DIAGNOSIS — S60212A Contusion of left wrist, initial encounter: Secondary | ICD-10-CM

## 2018-08-16 NOTE — Progress Notes (Signed)
52 year old black female returns for recheck of left wrist contusion.  States that her wrist is doing much better.  She has discontinue the splint.   Exam Pleasant white female alert and oriented in no acute distress.  Wrist range of motion is good.  No swelling.  No bruising.  Wrist nontender.    Plan Since patient is doing well not have any issues she will follow-up PRN.

## 2018-09-05 ENCOUNTER — Other Ambulatory Visit: Payer: Self-pay | Admitting: Nurse Practitioner

## 2018-09-05 ENCOUNTER — Ambulatory Visit: Payer: Medicare Other | Admitting: Nurse Practitioner

## 2018-09-05 DIAGNOSIS — Z1231 Encounter for screening mammogram for malignant neoplasm of breast: Secondary | ICD-10-CM

## 2018-09-05 DIAGNOSIS — R21 Rash and other nonspecific skin eruption: Secondary | ICD-10-CM | POA: Diagnosis not present

## 2018-09-05 DIAGNOSIS — E119 Type 2 diabetes mellitus without complications: Secondary | ICD-10-CM | POA: Diagnosis not present

## 2018-09-05 DIAGNOSIS — L910 Hypertrophic scar: Secondary | ICD-10-CM | POA: Diagnosis not present

## 2018-09-05 LAB — BASIC METABOLIC PANEL
BUN: 17 (ref 4–21)
CREATININE: 1 (ref 0.5–1.1)
Glucose: 88
POTASSIUM: 4.2 (ref 3.4–5.3)
Sodium: 143 (ref 137–147)

## 2018-09-05 LAB — HEMOGLOBIN A1C: Hgb A1c MFr Bld: 5.8 (ref 4.0–6.0)

## 2018-09-09 ENCOUNTER — Ambulatory Visit
Admission: RE | Admit: 2018-09-09 | Discharge: 2018-09-09 | Disposition: A | Discharge status: Home / Self Care | Payer: Medicare Other | Source: Ambulatory Visit | Attending: Nurse Practitioner | Admitting: Nurse Practitioner

## 2018-09-09 DIAGNOSIS — Z1231 Encounter for screening mammogram for malignant neoplasm of breast: Secondary | ICD-10-CM

## 2018-09-09 DIAGNOSIS — H1013 Acute atopic conjunctivitis, bilateral: Secondary | ICD-10-CM | POA: Diagnosis not present

## 2018-09-23 DIAGNOSIS — E119 Type 2 diabetes mellitus without complications: Secondary | ICD-10-CM | POA: Diagnosis not present

## 2018-09-23 DIAGNOSIS — H1013 Acute atopic conjunctivitis, bilateral: Secondary | ICD-10-CM | POA: Diagnosis not present

## 2018-09-28 ENCOUNTER — Ambulatory Visit (INDEPENDENT_AMBULATORY_CARE_PROVIDER_SITE_OTHER): Payer: Medicare Other

## 2018-09-28 ENCOUNTER — Ambulatory Visit (HOSPITAL_COMMUNITY)
Admission: EM | Admit: 2018-09-28 | Discharge: 2018-09-28 | Disposition: A | Discharge status: Home / Self Care | Payer: Medicare Other | Attending: Emergency Medicine | Admitting: Emergency Medicine

## 2018-09-28 ENCOUNTER — Encounter (HOSPITAL_COMMUNITY): Payer: Self-pay

## 2018-09-28 DIAGNOSIS — Z7984 Long term (current) use of oral hypoglycemic drugs: Secondary | ICD-10-CM | POA: Insufficient documentation

## 2018-09-28 DIAGNOSIS — K219 Gastro-esophageal reflux disease without esophagitis: Secondary | ICD-10-CM | POA: Diagnosis not present

## 2018-09-28 DIAGNOSIS — R3129 Other microscopic hematuria: Secondary | ICD-10-CM

## 2018-09-28 DIAGNOSIS — W19XXXA Unspecified fall, initial encounter: Secondary | ICD-10-CM | POA: Diagnosis not present

## 2018-09-28 DIAGNOSIS — M1990 Unspecified osteoarthritis, unspecified site: Secondary | ICD-10-CM | POA: Diagnosis not present

## 2018-09-28 DIAGNOSIS — N189 Chronic kidney disease, unspecified: Secondary | ICD-10-CM | POA: Diagnosis not present

## 2018-09-28 DIAGNOSIS — F419 Anxiety disorder, unspecified: Secondary | ICD-10-CM | POA: Insufficient documentation

## 2018-09-28 DIAGNOSIS — F329 Major depressive disorder, single episode, unspecified: Secondary | ICD-10-CM | POA: Diagnosis not present

## 2018-09-28 DIAGNOSIS — G894 Chronic pain syndrome: Secondary | ICD-10-CM | POA: Insufficient documentation

## 2018-09-28 DIAGNOSIS — Z792 Long term (current) use of antibiotics: Secondary | ICD-10-CM | POA: Diagnosis not present

## 2018-09-28 DIAGNOSIS — N760 Acute vaginitis: Secondary | ICD-10-CM

## 2018-09-28 DIAGNOSIS — Z836 Family history of other diseases of the respiratory system: Secondary | ICD-10-CM | POA: Diagnosis not present

## 2018-09-28 DIAGNOSIS — Z79899 Other long term (current) drug therapy: Secondary | ICD-10-CM | POA: Insufficient documentation

## 2018-09-28 DIAGNOSIS — S62646A Nondisplaced fracture of proximal phalanx of right little finger, initial encounter for closed fracture: Secondary | ICD-10-CM

## 2018-09-28 DIAGNOSIS — J45909 Unspecified asthma, uncomplicated: Secondary | ICD-10-CM | POA: Insufficient documentation

## 2018-09-28 DIAGNOSIS — B9689 Other specified bacterial agents as the cause of diseases classified elsewhere: Secondary | ICD-10-CM | POA: Diagnosis not present

## 2018-09-28 DIAGNOSIS — E1122 Type 2 diabetes mellitus with diabetic chronic kidney disease: Secondary | ICD-10-CM | POA: Insufficient documentation

## 2018-09-28 DIAGNOSIS — F1410 Cocaine abuse, uncomplicated: Secondary | ICD-10-CM | POA: Insufficient documentation

## 2018-09-28 DIAGNOSIS — E1151 Type 2 diabetes mellitus with diabetic peripheral angiopathy without gangrene: Secondary | ICD-10-CM | POA: Diagnosis not present

## 2018-09-28 DIAGNOSIS — S80211A Abrasion, right knee, initial encounter: Secondary | ICD-10-CM | POA: Insufficient documentation

## 2018-09-28 DIAGNOSIS — Z9889 Other specified postprocedural states: Secondary | ICD-10-CM | POA: Insufficient documentation

## 2018-09-28 DIAGNOSIS — M79641 Pain in right hand: Secondary | ICD-10-CM | POA: Diagnosis present

## 2018-09-28 LAB — POCT URINALYSIS DIP (DEVICE)
Glucose, UA: 250 mg/dL — AB
Nitrite: NEGATIVE
Protein, ur: 100 mg/dL — AB
Specific Gravity, Urine: >=1.03 (ref 1.005–1.030)
Urobilinogen, UA: 1 mg/dL (ref 0.0–1.0)
pH: 5.5 (ref 5.0–8.0)

## 2018-09-28 MED ORDER — METRONIDAZOLE 500 MG PO TABS
ORAL_TABLET | ORAL | Status: AC
  Filled 2018-09-28: qty 4

## 2018-09-28 MED ORDER — TRAMADOL HCL 50 MG PO TABS: 50.0000 mg | ORAL_TABLET | Freq: Four times a day (QID) | ORAL | 0 refills | Status: DC | PRN

## 2018-09-28 MED ORDER — METRONIDAZOLE 500 MG PO TABS
2000.0000 mg | ORAL_TABLET | Freq: Once | ORAL | Status: AC
  Administered 2018-09-28: 2000 mg via ORAL

## 2018-09-28 NOTE — Discharge Instructions (Addendum)
Follow up with your primary care provider regarding the blood and protein in your urine.  It is likely it's related to your vaginal infection and not the fall, however it is important with your kidney disease to be certain.  If anything changes and you experience abdominal pain or more bleeding with urination, go to the emergency department for further evaluation.

## 2018-09-28 NOTE — ED Triage Notes (Signed)
Pt presents with right hand/finger injury and right knee abrasion after a fall last night. Pt has a neurological disease that cause her body to stiffen and her to have frequent falls. Pt also complains of vaginal bleeding after her fall last night.

## 2018-09-28 NOTE — ED Provider Notes (Signed)
[facility]    CSN: [id number] Arrival date & time: 09/28/18  1101     History   Chief Complaint Chief Complaint  Patient presents with  . Fall    HPI Sherri [name] is a 52 y.o. female.   HPI   Patient is presenting today with complaints of right hand pain vaginal bleeding/discharge following a fall yesterday.  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  States that yesterday when walking from her car to her home she was unable to move her left leg and "stiffened up".  Patient states she fell onto the sidewalk and grass.  Patient has an abrasion on her right thigh superior to her right knee.  In addition patient states her right hand is painful she has difficulty with movement.  Patient states since her fall yesterday she had some blood-tinged urine and drainage.  Patient states she does not feel that it is related to the fall but rather that she has an undiagnosed pelvic infection such as trichomonas.  Patient denies abdominal pain, nausea, vomiting, neck or back pain, or headache.  Patient denies visual changes or loss of consciousness.  Patient is diabetic and has been diagnosed with chronic kidney disease.  Patient states the last time she was checked her proteins were well under control and her A1c was 5.1.  States was last diagnosed with trichomoniasis and a urinary tract infection approximately 6 to 7 months ago.   Past Medical History:  Diagnosis Date  . Angioedema   . Anxiety   . Arthritis   . Asthma   . CAP (community acquired pneumonia) 08/29/2012  . Chronic back pain   . Chronic kidney disease   . Cocaine abuse (HCC)   . Depression   . Diarrhea   . DVT (deep venous thrombosis) (HCC)   . Environmental allergies   . Fall   . GERD (gastroesophageal reflux disease)   . Heart murmur    has been told once that she has a heart murmur, but has never had any problems  . Hiatal hernia 03/21/2013  . Lumbar herniated disc   . Peripheral vascular disease  (HCC)   . Pernicious anemia 10/30/2011  . Postconcussion syndrome 12/30/2014  . Seasonal allergies   . Stiff person syndrome   . Urticaria     Patient Active Problem List   Diagnosis Date Noted  . Newly diagnosed diabetes (HCC) 01/21/2018  . Depression 01/02/2017  . History of DVT (deep vein thrombosis) 01/02/2017  . Chronic pain syndrome 01/02/2017  . Cocaine abuse (HCC) 01/02/2017  . Dysphagia   . Adjustment insomnia   . Distal radius fracture, right 08/16/2016  . Postconcussion syndrome 12/30/2014  . Dizziness 12/18/2014  . Tremor 03/11/2014  . 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 Hiatal hernia 03/21/2013  . Anemia, iron deficiency 01/29/2013  . Spasm of muscle 09/02/2012  . Benzodiazepine withdrawal (HCC) 08/30/2012  . CAP (community acquired pneumonia) 08/29/2012  . Hypernatremia 08/28/2012  . Metabolic acidosis 08/28/2012  . Leukocytosis 08/28/2012  . UTI (lower urinary tract infection) 08/28/2012  . Anemia 08/28/2012  . Acute back pain 08/27/2012  . Rhabdomyolysis 08/27/2012  . ARF (acute renal failure) (HCC) 08/27/2012  . Hypokalemia 08/27/2012  . Asthma 08/27/2012  . Chronic back pain   . Pernicious anemia 10/30/2011  . DEGENERATIVE DISC DISEASE 08/18/2010  . DEEP VEIN THROMBOSIS/PHLEBITIS 07/28/2010  . Asthma with bronchitis 07/28/2010  . URTICARIA 07/28/2010    Past Surgical History:  Procedure Laterality Date  .  BUNIONECTOMY Left   . colonoscopy  07/09/15  . inguinal hernia 1983  1983  . OPEN REDUCTION INTERNAL FIXATION (ORIF) DISTAL RADIAL FRACTURE Right 08/16/2016   Procedure: OPEN REDUCTION INTERNAL FIXATION (ORIF) DISTAL RADIAL FRACTURE;  Surgeon: Eldred Manges, MD;  Location: [facility];  Service: Orthopedics;  Laterality: Right;    OB History    Gravida  1   Para  1   Term  1   Preterm  0   AB  0   Living  1     SAB      TAB      Ectopic      Multiple      Live Births               Home Medications    Prior to Admission  medications   Medication Sig Start Date End Date Taking? Authorizing Provider  albuterol (PROVENTIL HFA;VENTOLIN HFA) 108 (90 BASE) MCG/ACT inhaler Inhale 1 puff into the lungs every 6 (six) hours as needed. PRO AIR 06/20/13   Jetty Duhamel D, MD  Ascorbic Acid (VITAMIN C) 1000 MG tablet Take 1,000 mg by mouth daily.    [provider]  baclofen (LIORESAL) 20 MG tablet TAKE ONE & ONE-HALF TABLETS BY MOUTH IN THE MORNING AND IN THE EVENING AND ONE AT  MIDDAY 09/25/17   Butch Penny, NP  benzocaine (BABY ORAJEL) 7.5 % oral gel Use as directed in the mouth or throat 3 (three) times daily as needed for pain. 08/08/17   Cathie Hoops, Amy V, PA-C  cetirizine (ZYRTEC ALLERGY) 10 MG tablet Take 1 tablet (10 mg total) by mouth daily. 08/24/17   Belinda Fisher, PA-C  Cholecalciferol (VITAMIN D3) 5000 units CAPS Take by mouth.    [provider]  Cranberry-Vitamin C-Vitamin E (CRANBERRY PLUS VITAMIN C) 4200-20-3 MG-MG-UNIT CAPS Take by mouth.    [provider]  cyanocobalamin (,VITAMIN B-12,) 1000 MCG/ML injection Inject 1,000 mcg into the muscle every 3 (three) months.     [provider]  desloratadine (CLARINEX) 5 MG tablet Take 1 tablet (5 mg total) by mouth daily. 11/06/11   Waymon Budge, MD  diazepam (VALIUM) 10 MG tablet Take 2 tablets (20 mg total) by mouth 3 (three) times daily. 05/16/18   York Spaniel, MD  diphenhydrAMINE (BENADRYL) 25 MG tablet Take 25 mg by mouth at bedtime as needed for itching or allergies.     [provider]  EPIPEN 2-PAK 0.3 MG/0.3ML SOAJ injection USE AS DIRECTED FOR  SEVERE  ALLERGIC  REACTIONS 08/16/15   Jetty Duhamel D, MD  famotidine (PEPCID) 40 MG tablet Take 75 mg by mouth daily.    [provider]  FLUoxetine (PROZAC) 10 MG capsule TAKE 1 CAPSULE BY MOUTH  DAILY 07/01/18   York Spaniel, MD  Fluticasone-Salmeterol (ADVAIR) 250-50 MCG/DOSE AEPB Inhale 1 puff into the lungs 2 (two) times daily.  03/14/12   Waymon Budge,  MD  gabapentin (NEURONTIN) 300 MG capsule 2 capsules in the morning and evening, one capsule at midday 09/25/17   Butch Penny, NP  gabapentin (NEURONTIN) 300 MG capsule 2 capsules in the morning and evening, one capsule at midday 05/16/18   York Spaniel, MD  glipiZIDE (GLUCOTROL) 5 MG tablet Take 1 tablet (5 mg total) by mouth daily before breakfast. 11/23/17   Cathren Laine, MD  Menthol, Topical Analgesic, 3.7 % GEL Apply topically as needed.    [provider]  metFORMIN (  GLUCOPHAGE) 500 MG tablet Take 1 tablet (500 mg total) by mouth 2 (two) times daily with a meal. 11/23/17   Cathren Laine, MD  metroNIDAZOLE (FLAGYL) 500 MG tablet Take 1 tablet (500 mg total) by mouth 2 (two) times daily. For positive trichomonas test 03/13/18   Isa Rankin, MD  olopatadine (PATANOL) 0.1 % ophthalmic solution INSTILL 1 DROP BY OPHTHALMIC ROUTE 2 TIMES EVERY DAY INTO BOTH EYES FOR 1 WEEK. 02/13/18   [provider]  OVER THE COUNTER MEDICATION I B Guard    [provider]  predniSONE (DELTASONE) 10 MG tablet Take 10 mg by mouth daily with breakfast.    [provider]  traMADol (ULTRAM) 50 MG tablet Take 1 tablet (50 mg total) by mouth every 6 (six) hours as needed. 09/28/18   Servando Salina, NP    Family History Family History  Problem Relation Age of Onset  . Cancer Mother   . Other Mother   . COPD Father   . Asthma Brother   . Cancer Brother        colon  . Heart attack Brother   . Seizures Brother     Social History Social History   Tobacco Use  . Smoking status: Never Smoker  . Smokeless tobacco: Never Used  . Tobacco comment: Never Used Tobacco  Substance Use Topics  . Alcohol use: Yes    Alcohol/week: 0.0 standard drinks    Comment: Occasional  . Drug use: Yes    Types: Cocaine    Comment: in the last two weeks as of 08/08/17     Allergies   Ibuprofen; Tylenol [acetaminophen]; Amoxicillin; Lemon flavor; and  Amoxicillin   Review of Systems Review of Systems  Constitutional: Negative.  Negative for chills and fever.  HENT: Negative.  Negative for ear pain and sore throat.   Eyes: Negative.  Negative for pain and visual disturbance.  Respiratory: Negative for cough and shortness of breath.   Cardiovascular: Negative for chest pain and palpitations.  Gastrointestinal: Negative for abdominal pain and vomiting.  Endocrine: Negative.   Genitourinary: Positive for hematuria and vaginal discharge. Negative for decreased urine volume, difficulty urinating, dyspareunia, dysuria, enuresis, flank pain, frequency, genital sores, menstrual problem, pelvic pain, urgency, vaginal bleeding and vaginal pain.  Musculoskeletal: Negative for arthralgias and back pain.  Skin: Negative for color change and rash.  Allergic/Immunologic: Negative.   Neurological: Negative for dizziness, seizures, syncope, facial asymmetry, weakness, light-headedness and numbness.       Reports "stiffening up"  Yesterday.   Hematological: Negative.   Psychiatric/Behavioral: Negative.   All other systems reviewed and are negative.    Physical Exam Triage Vital Signs ED Triage Vitals [09/28/18 1135]  Enc Vitals Group     BP 101/69     Pulse Rate 87     Resp 20     Temp 97.9 F (36.6 C)     Temp Source Oral     SpO2 100 %     Weight      Height      Head Circumference      Peak Flow      Pain Score      Pain Loc      Pain Edu?      Excl. in GC?    No data found.  Updated Vital Signs BP 101/69 (BP Location: Right Arm)   Pulse 87   Temp 97.9 F (36.6 C) (Oral)   Resp 20   LMP  07/29/2012   SpO2 100%    Physical Exam  Constitutional: She appears well-developed and well-nourished. No distress.  HENT:  Head: Normocephalic and atraumatic.  Eyes: Pupils are equal, round, and reactive to light. Conjunctivae are normal. Right eye exhibits no discharge. Left eye exhibits no discharge. No scleral icterus.  Neck:  Normal range of motion. Neck supple.  Negative for nuchal rigidity.  Cardiovascular: Normal rate, regular rhythm, normal heart sounds and intact distal pulses. Exam reveals no gallop and no friction rub.  No murmur heard. Pulmonary/Chest: Effort normal and breath sounds normal. No stridor. No respiratory distress. She has no wheezes. She has no rales. She exhibits no tenderness.  Abdominal: Soft. Bowel sounds are normal.  Genitourinary: Vagina normal.  Genitourinary Comments: Tolerated exam with Peterson speculum well.  Copious green discharge frothy in appearance noted in vaginal vault.  Small amount of petechial hemorrhaging noted in vaginal vestibule.  Negative for cervical motion tenderness.  Musculoskeletal: Normal range of motion. She exhibits edema.  Skin: She is not diaphoretic.  Nursing note and vitals reviewed.    UC Treatments / Results  Labs (all labs ordered are listed, but only abnormal results are displayed) Labs Reviewed  POCT URINALYSIS DIP (DEVICE) - Abnormal; Notable for the following components:      Result Value   Glucose, UA 250 (*)    Bilirubin Urine SMALL (*)    Ketones, ur TRACE (*)    Hgb urine dipstick LARGE (*)    Protein, ur 100 (*)    Leukocytes, UA TRACE (*)    All other components within normal limits  CERVICOVAGINAL ANCILLARY ONLY    EKG None  Radiology Dg Finger Little Right  Result Date: 09/28/2018 CLINICAL DATA:  Fall.  Right little finger pain EXAM: RIGHT LITTLE FINGER 2+V COMPARISON:  None. FINDINGS: There is a fracture through the proximal aspect of the right little finger proximal phalanx. No displacement. No subluxation or dislocation. IMPRESSION: Nondisplaced fracture through the proximal aspect of the right little finger proximal phalanx. Electronically Signed   By: Charlett Nose M.D.   On: 09/28/2018 12:06    Procedures Procedures (including critical care time)  Medications Ordered in UC Medications  metroNIDAZOLE (FLAGYL) tablet  2,000 mg (2,000 mg Oral Given 09/28/18 1228)    Initial Impression / Assessment and Plan / UC Course  I have reviewed the triage vital signs and the nursing notes.  Pertinent labs & imaging results that were available during my care of the patient were reviewed by me and considered in my medical decision making (see chart for details).     In discussion with patient, she does not feel that blood in urine is related to fall and potential injury, but rather is incidental to fall and just wants checked while she is here.    Final Clinical Impressions(s) / UC Diagnoses   Final diagnoses:  Closed nondisplaced fracture of proximal phalanx of right little finger, initial encounter  Fall, initial encounter  Other microscopic hematuria  Bacterial vaginosis     Discharge Instructions     Follow up with your primary care provider regarding the blood and protein in your urine.  It is likely it's related to your vaginal infection and not the fall, however it is important with your kidney disease to be certain.  If anything changes and you experience abdominal pain or more bleeding with urination, go to the emergency department for further evaluation.     ED Prescriptions    Medication Sig  Dispense Auth. Provider   traMADol (ULTRAM) 50 MG tablet Take 1 tablet (50 mg total) by mouth every 6 (six) hours as needed. 15 tablet Servando Salina, NP     Controlled Substance Prescriptions [place] Controlled Substance Registry consulted? Yes, I have consulted the [place] Controlled Substances Registry for this patient, and feel the risk/benefit ratio today is favorable for proceeding with this prescription for a controlled substance.  The usual and customary discharge instructions and warnings were given.  The patient verbalizes understanding and agrees to plan of care.      Servando Salina, NP 09/28/18 [phone]

## 2018-09-30 ENCOUNTER — Telehealth (HOSPITAL_COMMUNITY): Payer: Self-pay

## 2018-09-30 LAB — CERVICOVAGINAL ANCILLARY ONLY
Bacterial vaginitis: NEGATIVE
Candida vaginitis: NEGATIVE
Chlamydia: NEGATIVE
Neisseria Gonorrhea: NEGATIVE
Trichomonas: POSITIVE — AB

## 2018-09-30 NOTE — Telephone Encounter (Signed)
Correction, patient was treated in office. Patient did not answer and still needs to be made aware of results.

## 2018-09-30 NOTE — Telephone Encounter (Signed)
Trichomonas is positive. Rx metronidazole 500mg  bid x 7d #14 no refills was sent to the pharmacy of record. Need to educate patient to refrain from sexual intercourse for 7 days to give the medicine time to work. Sexual partners need to be notified and tested/treated. Condoms may reduce risk of reinfection. Attempted to reach patient, no answer at this time.

## 2018-10-01 ENCOUNTER — Telehealth: Payer: Self-pay

## 2018-10-01 NOTE — Telephone Encounter (Signed)
Patient called stating she went to the urgent care on Friday because she fell and broke her pinky. She stated if you have any questions or need further information you can call her at [phone]. YRL,RMA

## 2018-10-03 ENCOUNTER — Telehealth (HOSPITAL_COMMUNITY): Payer: Self-pay

## 2018-10-03 NOTE — Telephone Encounter (Signed)
Pt aware of results and educated on safe sex practices. 

## 2018-10-03 NOTE — Telephone Encounter (Signed)
Attempted to reach patient x2. No answer at this time. Voicemail left.    

## 2018-10-08 ENCOUNTER — Encounter: Payer: Self-pay | Admitting: Neurology

## 2018-10-08 ENCOUNTER — Ambulatory Visit (INDEPENDENT_AMBULATORY_CARE_PROVIDER_SITE_OTHER): Payer: Medicare Other | Admitting: Neurology

## 2018-10-08 VITALS — BP 85/70 | HR 89 | Ht 63.0 in | Wt 124.5 lb

## 2018-10-08 DIAGNOSIS — G2582 Stiff-man syndrome: Secondary | ICD-10-CM | POA: Diagnosis not present

## 2018-10-08 NOTE — Progress Notes (Signed)
Reason for visit: Stiff person syndrome  Sherri [name] is an 52 y.o. female  History of present illness:  Sherri [name] is a 52 year old right-handed black female with a history of stiff person syndrome.  The patient has had rhabdomyolysis in the past, but she has also had issues with cocaine abuse which can cause rhabdomyolysis.  She has had documented very high levels of anti-gad antibodies suggestive of the diagnosis of stiff person syndrome.  The patient has been on baclofen and diazepam, she also has a significant anxiety disorder and the diazepam will help this as well.  The patient has reported some occasional falls over the last month or two.  She usually uses a cane for ambulation, but she will note that either the left or right leg may suddenly feel stiff and she cannot move it and then she will topple over.  This lasts only for a few moments, then she is able to walk normally again.  The patient reports no back pain, she remains on gabapentin however.  The patient returns to this office for an evaluation.  Past Medical History:  Diagnosis Date  . Angioedema   . Anxiety   . Arthritis   . Asthma   . CAP (community acquired pneumonia) 08/29/2012  . Chronic back pain   . Chronic kidney disease   . Cocaine abuse (HCC)   . Depression   . Diarrhea   . DVT (deep venous thrombosis) (HCC)   . Environmental allergies   . Fall   . GERD (gastroesophageal reflux disease)   . Heart murmur    has been told once that she has a heart murmur, but has never had any problems  . Hiatal hernia 03/21/2013  . Lumbar herniated disc   . Peripheral vascular disease (HCC)   . Pernicious anemia 10/30/2011  . Postconcussion syndrome 12/30/2014  . Seasonal allergies   . Stiff person syndrome   . Urticaria     Past Surgical History:  Procedure Laterality Date  . BUNIONECTOMY Left   . colonoscopy  07/09/15  . inguinal hernia 1983  1983  . OPEN REDUCTION INTERNAL FIXATION (ORIF) DISTAL RADIAL  FRACTURE Right 08/16/2016   Procedure: OPEN REDUCTION INTERNAL FIXATION (ORIF) DISTAL RADIAL FRACTURE;  Surgeon: Eldred Manges, MD;  Location: [facility];  Service: Orthopedics;  Laterality: Right;    Family History  Problem Relation Age of Onset  . Cancer Mother   . Other Mother   . COPD Father   . Asthma Brother   . Cancer Brother        colon  . Heart attack Brother   . Seizures Brother     Social history:  reports that she has never smoked. She has never used smokeless tobacco. She reports that she drinks alcohol. She reports that she has current or past drug history. Drug: Cocaine.    Allergies  Allergen Reactions  . Ibuprofen Other (See Comments)    Does not take due to hx of renal insufficiency "I have kidney disease"   . Tylenol [Acetaminophen] Hives    Cannot take large quantities  . Amoxicillin Diarrhea and Other (See Comments)  . Lemon Flavor Swelling    Severe Lip Swelling  FRUIT per pt.    . Amoxicillin Diarrhea    Medications:  Prior to Admission medications   Medication Sig Start Date End Date Taking? Authorizing Provider  albuterol (PROVENTIL HFA;VENTOLIN HFA) 108 (90 BASE) MCG/ACT inhaler Inhale 1 puff into the lungs every 6 (  six) hours as needed. PRO AIR 06/20/13  Yes Young, Joni Fears D, MD  Ascorbic Acid (VITAMIN C) 1000 MG tablet Take 1,000 mg by mouth daily.   Yes [provider]  baclofen (LIORESAL) 20 MG tablet TAKE ONE & ONE-HALF TABLETS BY MOUTH IN THE MORNING AND IN THE EVENING AND ONE AT  MIDDAY 09/25/17  Yes Millikan, Megan, NP  benzocaine (BABY ORAJEL) 7.5 % oral gel Use as directed in the mouth or throat 3 (three) times daily as needed for pain. 08/08/17  Yes Yu, Amy V, PA-C  cetirizine (ZYRTEC ALLERGY) 10 MG tablet Take 1 tablet (10 mg total) by mouth daily. 08/24/17  Yes Yu, Amy V, PA-C  Cholecalciferol (VITAMIN D3) 5000 units CAPS Take by mouth.   Yes [provider]  Cranberry-Vitamin C-Vitamin E (CRANBERRY PLUS VITAMIN C) 4200-20-3  MG-MG-UNIT CAPS Take by mouth.   Yes [provider]  cyanocobalamin (,VITAMIN B-12,) 1000 MCG/ML injection Inject 1,000 mcg into the muscle every 3 (three) months.    Yes [provider]  desloratadine (CLARINEX) 5 MG tablet Take 1 tablet (5 mg total) by mouth daily. 11/06/11  Yes Young, Joni Fears D, MD  diazepam (VALIUM) 10 MG tablet Take 2 tablets (20 mg total) by mouth 3 (three) times daily. 05/16/18  Yes York Spaniel, MD  diphenhydrAMINE (BENADRYL) 25 MG tablet Take 25 mg by mouth at bedtime as needed for itching or allergies.    Yes [provider]  EPIPEN 2-PAK 0.3 MG/0.3ML SOAJ injection USE AS DIRECTED FOR  SEVERE  ALLERGIC  REACTIONS 08/16/15  Yes Young, Joni Fears D, MD  famotidine (PEPCID) 40 MG tablet Take 75 mg by mouth daily.   Yes [provider]  FLUoxetine (PROZAC) 10 MG capsule TAKE 1 CAPSULE BY MOUTH  DAILY 07/01/18  Yes York Spaniel, MD  Fluticasone-Salmeterol (ADVAIR) 250-50 MCG/DOSE AEPB Inhale 1 puff into the lungs 2 (two) times daily.  03/14/12  Yes Jetty Duhamel D, MD  gabapentin (NEURONTIN) 300 MG capsule 2 capsules in the morning and evening, one capsule at midday 09/25/17  Yes Butch Penny, NP  gabapentin (NEURONTIN) 300 MG capsule 2 capsules in the morning and evening, one capsule at midday 05/16/18  Yes York Spaniel, MD  glipiZIDE (GLUCOTROL) 5 MG tablet Take 1 tablet (5 mg total) by mouth daily before breakfast. Patient taking differently: Take 2.5 mg by mouth daily before breakfast.  11/23/17  Yes Cathren Laine, MD  Menthol, Topical Analgesic, 3.7 % GEL Apply topically as needed.   Yes [provider]  metFORMIN (GLUCOPHAGE) 500 MG tablet Take 1 tablet (500 mg total) by mouth 2 (two) times daily with a meal. 11/23/17  Yes Cathren Laine, MD  metroNIDAZOLE (FLAGYL) 500 MG tablet Take 1 tablet (500 mg total) by mouth 2 (two) times daily. For positive trichomonas test 03/13/18  Yes Isa Rankin, MD  olopatadine  (PATANOL) 0.1 % ophthalmic solution INSTILL 1 DROP BY OPHTHALMIC ROUTE 2 TIMES EVERY DAY INTO BOTH EYES FOR 1 WEEK. 02/13/18  Yes [provider]  OVER THE COUNTER MEDICATION I B Guard   Yes [provider]  traMADol (ULTRAM) 50 MG tablet Take 1 tablet (50 mg total) by mouth every 6 (six) hours as needed. 09/28/18  Yes Servando Salina, NP    ROS:  Out of a complete 14 system review of symptoms, the patient complains only of the following symptoms, and all other reviewed systems are negative.  Depression Walking problems  Blood pressure [name])  85/70, pulse 89, height 5\' 3"  (1.6 m), weight 124 lb 8 oz (56.5 kg), last menstrual period 07/29/2012, SpO2 95 %.  Physical Exam  General: The patient is alert and cooperative at the time of the examination.  Skin: No significant peripheral edema is noted.   Neurologic Exam  Mental status: The patient is alert and oriented x 3 at the time of the examination. The patient has apparent normal recent and remote memory, with an apparently normal attention span and concentration ability.   Cranial nerves: Facial symmetry is present. Speech is normal, no aphasia or dysarthria is noted. Extraocular movements are full. Visual fields are full.  Motor: The patient has good strength in all 4 extremities.  Sensory examination: Soft touch sensation is symmetric on the face, arms, and legs.  Coordination: The patient has good finger-nose-finger and heel-to-shin bilaterally.  Gait and station: The patient has a normal gait.  The patient does have a cane for ambulation, however.  Tandem gait is normal. Romberg is negative. No drift is seen.  Reflexes: Deep tendon reflexes are symmetric.   Assessment/Plan:  1.  Stiff person syndrome  2.  Anxiety disorder  The patient is reporting some occasional falls, she is no longer having a lot of symptoms suggestive of stiff person syndrome, she is not having back pain or muscle spasms.  We will  taper her slowly off of the gabapentin by 300 mg every week until she is off the drug.  The patient will remain on her current dose of diazepam and baclofen.  She will follow-up through this office in about 6 months.  Marlan Palau MD 10/08/2018 2:52 PM  [facility] [address] [place], [place] [address]  Phone [phone] Fax [phone]

## 2018-10-08 NOTE — Patient Instructions (Signed)
Taper the gabapentin by 300 mg each week until off of the medication.   Week 1  300/ 300/ 600  Week 2  300/ 300/ 300  Week 3 300/ 0/ 300  Week 4 0/ 0/ 300  Then stop week 5

## 2018-10-10 ENCOUNTER — Encounter: Payer: Self-pay | Admitting: Nurse Practitioner

## 2018-10-10 DIAGNOSIS — E118 Type 2 diabetes mellitus with unspecified complications: Secondary | ICD-10-CM

## 2018-10-10 DIAGNOSIS — L910 Hypertrophic scar: Secondary | ICD-10-CM | POA: Insufficient documentation

## 2018-10-10 DIAGNOSIS — R21 Rash and other nonspecific skin eruption: Secondary | ICD-10-CM

## 2018-10-10 DIAGNOSIS — E119 Type 2 diabetes mellitus without complications: Secondary | ICD-10-CM | POA: Insufficient documentation

## 2018-10-10 NOTE — Progress Notes (Signed)
Call patient to ask her if she is checking her blood pressure at home.  During her recent visit with Dr. Anne Hahn her blood pressure was los.

## 2018-10-11 ENCOUNTER — Ambulatory Visit: Payer: Medicare Other | Admitting: Nurse Practitioner

## 2018-10-18 ENCOUNTER — Ambulatory Visit (INDEPENDENT_AMBULATORY_CARE_PROVIDER_SITE_OTHER): Payer: Medicare Other | Admitting: Orthopaedic Surgery

## 2018-10-18 ENCOUNTER — Encounter (INDEPENDENT_AMBULATORY_CARE_PROVIDER_SITE_OTHER): Payer: Self-pay | Admitting: Orthopaedic Surgery

## 2018-10-18 VITALS — BP 92/63 | HR 81 | Ht 63.0 in | Wt 124.0 lb

## 2018-10-18 DIAGNOSIS — S62646A Nondisplaced fracture of proximal phalanx of right little finger, initial encounter for closed fracture: Secondary | ICD-10-CM

## 2018-10-18 DIAGNOSIS — S62646D Nondisplaced fracture of proximal phalanx of right little finger, subsequent encounter for fracture with routine healing: Secondary | ICD-10-CM

## 2018-10-18 HISTORY — DX: Nondisplaced fracture of proximal phalanx of right little finger, initial encounter for closed fracture: S62.646A

## 2018-10-18 NOTE — Progress Notes (Signed)
Office Visit Note   Patient: Sherri [name]           Date of Birth: Jul 29, 1966           MRN: [id number] Visit Date: 10/18/2018              Requested by: Arnette Felts, FNP [address] [place], [place] [address] PCP: Arnette Felts, FNP   Assessment & Plan: Visit Diagnoses:  1. Closed nondisplaced fracture of proximal phalanx of right little finger with routine healing, subsequent encounter     Plan: Return in 3 weeks.  Buddy taping ring to small finger she will work on MCP range of motion and PIP flexion.  Repeat x-rays fourth and fifth finger in 3 weeks on return.  Follow-Up Instructions: Return in about 3 weeks (around 11/08/2018).   Orders:  No orders of the defined types were placed in this encounter.  No orders of the defined types were placed in this encounter.     Procedures: No procedures performed   Clinical Data: No additional findings.   Subjective: Chief Complaint  Patient presents with  . Right Hand - Pain    HPI 52 year old female now about 4 weeks post right fifth finger injury.  Originally she thought the date of injury was 09/28/2018 but she actually injured it the week prior.  She had a fall she has a diagnosis of stiff person syndrome and is on 60 mg of Valium daily for this plus gabapentin.  She is also had problems with L5-S1 H&P but is never had surgery.  She is had a volar splint on which is removed today.  Fracture was metaphyseal nondisplaced and slightly oblique.  Review of Systems 14 point update positive for disc degeneration.  L5-S1 H&P.  Acute renal failure, rhabdomyolysis, Type 2 diabetes history of DVT history of cocaine abuse, benzodiazepine withdrawal, UTI, otherwise negative as it pertains HPI.  Objective: Vital Signs: BP 92/63   Pulse 81   Ht 5\' 3"  (1.6 m)   Wt 124 lb (56.2 kg)   LMP 07/29/2012   BMI 21.97 kg/m   Physical Exam  Constitutional: She is oriented to person, place, and time. She appears  well-developed.  HENT:  Head: Normocephalic.  Right Ear: External ear normal.  Left Ear: External ear normal.  Eyes: Pupils are equal, round, and reactive to light.  Neck: No tracheal deviation present. No thyromegaly present.  Cardiovascular: Normal rate.  Pulmonary/Chest: Effort normal.  Abdominal: Soft.  Neurological: She is alert and oriented to person, place, and time.  Skin: Skin is warm and dry.  Psychiatric: She has a normal mood and affect. Her behavior is normal.    Ortho Exam patient lacks 3 cm touching fingertip distal palmar crease no angular deformity of the right fifth finger proximal phalanx.  EIP reach full extension sensation in the fingertip is intact.  She has mild swelling of the PIP joint of the ring finger which she states she injured at the same time as the fifth finger.  Specialty Comments:  No specialty comments available.  Imaging: No results found.   PMFS History: Patient Active Problem List   Diagnosis Date Noted  . Keloid 10/10/2018  . Diabetes type 2, controlled (HCC) 10/10/2018  . Rash and nonspecific skin eruption 10/10/2018  . Newly diagnosed diabetes (HCC) 01/21/2018  . Depression 01/02/2017  . History of DVT (deep vein thrombosis) 01/02/2017  . Chronic pain syndrome 01/02/2017  . Cocaine abuse (HCC) 01/02/2017  . Dysphagia   .  Adjustment insomnia   . Distal radius fracture, right 08/16/2016  . Postconcussion syndrome 12/30/2014  . Dizziness 12/18/2014  . Tremor 03/11/2014  . 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 Hiatal hernia 03/21/2013  . Anemia, iron deficiency 01/29/2013  . Spasm of muscle 09/02/2012  . Benzodiazepine withdrawal (HCC) 08/30/2012  . CAP (community acquired pneumonia) 08/29/2012  . Hypernatremia 08/28/2012  . Metabolic acidosis 08/28/2012  . Leukocytosis 08/28/2012  . UTI (lower urinary tract infection) 08/28/2012  . Anemia 08/28/2012  . Acute back pain 08/27/2012  . Rhabdomyolysis 08/27/2012  . ARF (acute renal  failure) (HCC) 08/27/2012  . Hypokalemia 08/27/2012  . Asthma 08/27/2012  . Chronic back pain   . Pernicious anemia 10/30/2011  . DEGENERATIVE DISC DISEASE 08/18/2010  . DEEP VEIN THROMBOSIS/PHLEBITIS 07/28/2010  . Asthma with bronchitis 07/28/2010  . URTICARIA 07/28/2010   Past Medical History:  Diagnosis Date  . Angioedema   . Anxiety   . Arthritis   . Asthma   . CAP (community acquired pneumonia) 08/29/2012  . Chronic back pain   . Chronic kidney disease   . Cocaine abuse (HCC)   . Depression   . Diarrhea   . DVT (deep venous thrombosis) (HCC)   . Environmental allergies   . Fall   . GERD (gastroesophageal reflux disease)   . Heart murmur    has been told once that she has a heart murmur, but has never had any problems  . Hiatal hernia 03/21/2013  . Lumbar herniated disc   . Peripheral vascular disease (HCC)   . Pernicious anemia 10/30/2011  . Postconcussion syndrome 12/30/2014  . Seasonal allergies   . Stiff person syndrome   . Urticaria     Family History  Problem Relation Age of Onset  . Cancer Mother   . Other Mother   . COPD Father   . Asthma Brother   . Cancer Brother        colon  . Heart attack Brother   . Seizures Brother     Past Surgical History:  Procedure Laterality Date  . BUNIONECTOMY Left   . colonoscopy  07/09/15  . inguinal hernia 1983  1983  . OPEN REDUCTION INTERNAL FIXATION (ORIF) DISTAL RADIAL FRACTURE Right 08/16/2016   Procedure: OPEN REDUCTION INTERNAL FIXATION (ORIF) DISTAL RADIAL FRACTURE;  Surgeon: Eldred Manges, MD;  Location: [facility] OR;  Service: Orthopedics;  Laterality: Right;   Social History   Occupational History  . Occupation: disabled  Tobacco Use  . Smoking status: Never Smoker  . Smokeless tobacco: Never Used  . Tobacco comment: Never Used Tobacco  Substance and Sexual Activity  . Alcohol use: Yes    Alcohol/week: 0.0 standard drinks    Comment: Occasional  . Drug use: Yes    Types: Cocaine    Comment: in the last two  weeks as of 08/08/17  . Sexual activity: Not on file

## 2018-10-24 ENCOUNTER — Other Ambulatory Visit: Payer: Self-pay | Admitting: Nurse Practitioner

## 2018-10-24 ENCOUNTER — Telehealth: Payer: Self-pay | Admitting: Hematology & Oncology

## 2018-10-24 DIAGNOSIS — E2839 Other primary ovarian failure: Secondary | ICD-10-CM

## 2018-10-24 DIAGNOSIS — S62646D Nondisplaced fracture of proximal phalanx of right little finger, subsequent encounter for fracture with routine healing: Secondary | ICD-10-CM

## 2018-10-24 NOTE — Telephone Encounter (Signed)
Returned call to patient per voice message to resc 11/4 appt. In formed pt of 11/11 appt time at 215 pm. Asked pt to call office if this date/time needs to be rescheduled.

## 2018-10-28 ENCOUNTER — Ambulatory Visit: Payer: Medicare Other

## 2018-10-28 ENCOUNTER — Other Ambulatory Visit: Payer: Medicare Other

## 2018-10-28 ENCOUNTER — Ambulatory Visit: Payer: Medicare Other | Admitting: Hematology & Oncology

## 2018-11-03 ENCOUNTER — Other Ambulatory Visit: Payer: Self-pay | Admitting: Adult Health

## 2018-11-04 ENCOUNTER — Inpatient Hospital Stay (HOSPITAL_BASED_OUTPATIENT_CLINIC_OR_DEPARTMENT_OTHER): Payer: Medicare Other | Admitting: Hematology & Oncology

## 2018-11-04 ENCOUNTER — Inpatient Hospital Stay: Payer: Medicare Other | Attending: Hematology & Oncology

## 2018-11-04 ENCOUNTER — Encounter: Payer: Self-pay | Admitting: Hematology & Oncology

## 2018-11-04 ENCOUNTER — Inpatient Hospital Stay: Payer: Medicare Other

## 2018-11-04 ENCOUNTER — Other Ambulatory Visit: Payer: Self-pay

## 2018-11-04 ENCOUNTER — Telehealth: Payer: Self-pay | Admitting: Hematology & Oncology

## 2018-11-04 VITALS — BP 116/70 | HR 70 | Temp 98.3°F | Resp 16 | Wt 124.0 lb

## 2018-11-04 DIAGNOSIS — G2582 Stiff-man syndrome: Secondary | ICD-10-CM | POA: Insufficient documentation

## 2018-11-04 DIAGNOSIS — D509 Iron deficiency anemia, unspecified: Secondary | ICD-10-CM | POA: Insufficient documentation

## 2018-11-04 DIAGNOSIS — Z86718 Personal history of other venous thrombosis and embolism: Secondary | ICD-10-CM

## 2018-11-04 DIAGNOSIS — E119 Type 2 diabetes mellitus without complications: Secondary | ICD-10-CM | POA: Insufficient documentation

## 2018-11-04 DIAGNOSIS — D508 Other iron deficiency anemias: Secondary | ICD-10-CM

## 2018-11-04 DIAGNOSIS — D500 Iron deficiency anemia secondary to blood loss (chronic): Secondary | ICD-10-CM

## 2018-11-04 DIAGNOSIS — D510 Vitamin B12 deficiency anemia due to intrinsic factor deficiency: Secondary | ICD-10-CM

## 2018-11-04 DIAGNOSIS — Z79899 Other long term (current) drug therapy: Secondary | ICD-10-CM | POA: Diagnosis not present

## 2018-11-04 DIAGNOSIS — Z7984 Long term (current) use of oral hypoglycemic drugs: Secondary | ICD-10-CM | POA: Diagnosis not present

## 2018-11-04 DIAGNOSIS — Z9181 History of falling: Secondary | ICD-10-CM | POA: Diagnosis not present

## 2018-11-04 LAB — CBC WITH DIFFERENTIAL (CANCER CENTER ONLY)
Abs Immature Granulocytes: 0.01 10*3/uL (ref 0.00–0.07)
BASOS ABS: 0 10*3/uL (ref 0.0–0.1)
Basophils Relative: 0 %
EOS ABS: 0 10*3/uL (ref 0.0–0.5)
Eosinophils Relative: 0 %
HEMATOCRIT: 37.3 % (ref 36.0–46.0)
Hemoglobin: 11.5 g/dL — ABNORMAL LOW (ref 12.0–15.0)
IMMATURE GRANULOCYTES: 0 %
Lymphocytes Relative: 33 %
Lymphs Abs: 1.8 10*3/uL (ref 0.7–4.0)
MCH: 27.8 pg (ref 26.0–34.0)
MCHC: 30.8 g/dL (ref 30.0–36.0)
MCV: 90.3 fL (ref 80.0–100.0)
Monocytes Absolute: 0.4 10*3/uL (ref 0.1–1.0)
Monocytes Relative: 7 %
NEUTROS PCT: 60 %
NRBC: 0 % (ref 0.0–0.2)
Neutro Abs: 3.1 10*3/uL (ref 1.7–7.7)
Platelet Count: 264 10*3/uL (ref 150–400)
RBC: 4.13 MIL/uL (ref 3.87–5.11)
RDW: 12.5 % (ref 11.5–15.5)
WBC Count: 5.3 10*3/uL (ref 4.0–10.5)

## 2018-11-04 LAB — CMP (CANCER CENTER ONLY)
ALT: 13 U/L (ref 10–47)
AST: 20 U/L (ref 11–38)
Albumin: 3.6 g/dL (ref 3.5–5.0)
Alkaline Phosphatase: 68 U/L (ref 26–84)
Anion gap: 5 (ref 5–15)
BILIRUBIN TOTAL: 0.4 mg/dL (ref 0.2–1.6)
BUN: 17 mg/dL (ref 7–22)
CO2: 30 mmol/L (ref 18–33)
CREATININE: 0.7 mg/dL (ref 0.60–1.20)
Calcium: 10.1 mg/dL (ref 8.0–10.3)
Chloride: 110 mmol/L — ABNORMAL HIGH (ref 98–108)
Glucose, Bld: 101 mg/dL (ref 73–118)
Potassium: 4.5 mmol/L (ref 3.3–4.7)
Sodium: 145 mmol/L (ref 128–145)
Total Protein: 7.3 g/dL (ref 6.4–8.1)

## 2018-11-04 LAB — VITAMIN B12: Vitamin B-12: 236 pg/mL (ref 180–914)

## 2018-11-04 MED ORDER — CYANOCOBALAMIN 1000 MCG/ML IJ SOLN
INTRAMUSCULAR | Status: AC
  Filled 2018-11-04: qty 1

## 2018-11-04 MED ORDER — CYANOCOBALAMIN 1000 MCG/ML IJ SOLN
1000.0000 ug | Freq: Once | INTRAMUSCULAR | Status: AC
  Administered 2018-11-04: 1000 ug via INTRAMUSCULAR

## 2018-11-04 NOTE — Telephone Encounter (Signed)
Appts scheduled calendar printed/avs declined per 11/11 los

## 2018-11-04 NOTE — Patient Instructions (Signed)
Cyanocobalamin, Vitamin B12 injection What is this medicine? CYANOCOBALAMIN (sye an oh koe BAL a min) is a man made form of vitamin B12. Vitamin B12 is used in the growth of healthy blood cells, nerve cells, and proteins in the body. It also helps with the metabolism of fats and carbohydrates. This medicine is used to treat people who can not absorb vitamin B12. This medicine may be used for other purposes; ask your health care provider or pharmacist if you have questions. COMMON BRAND NAME(S): B-12 Compliance Kit, B-12 Injection Kit, Cyomin, LA-12, Nutri-Twelve, Physicians EZ Use B-12, Primabalt What should I tell my health care provider before I take this medicine? They need to know if you have any of these conditions: -kidney disease -Leber's disease -megaloblastic anemia -an unusual or allergic reaction to cyanocobalamin, cobalt, other medicines, foods, dyes, or preservatives -pregnant or trying to get pregnant -breast-feeding How should I use this medicine? This medicine is injected into a muscle or deeply under the skin. It is usually given by a health care professional in a clinic or doctor's office. However, your doctor may teach you how to inject yourself. Follow all instructions. Talk to your pediatrician regarding the use of this medicine in children. Special care may be needed. Overdosage: If you think you have taken too much of this medicine contact a poison control center or emergency room at once. NOTE: This medicine is only for you. Do not share this medicine with others. What if I miss a dose? If you are given your dose at a clinic or doctor's office, call to reschedule your appointment. If you give your own injections and you miss a dose, take it as soon as you can. If it is almost time for your next dose, take only that dose. Do not take double or extra doses. What may interact with this medicine? -colchicine -heavy alcohol intake This list may not describe all possible  interactions. Give your health care provider a list of all the medicines, herbs, non-prescription drugs, or dietary supplements you use. Also tell them if you smoke, drink alcohol, or use illegal drugs. Some items may interact with your medicine. What should I watch for while using this medicine? Visit your doctor or health care professional regularly. You may need blood work done while you are taking this medicine. You may need to follow a special diet. Talk to your doctor. Limit your alcohol intake and avoid smoking to get the best benefit. What side effects may I notice from receiving this medicine? Side effects that you should report to your doctor or health care professional as soon as possible: -allergic reactions like skin rash, itching or hives, swelling of the face, lips, or tongue -blue tint to skin -chest tightness, pain -difficulty breathing, wheezing -dizziness -red, swollen painful area on the leg Side effects that usually do not require medical attention (report to your doctor or health care professional if they continue or are bothersome): -diarrhea -headache This list may not describe all possible side effects. Call your doctor for medical advice about side effects. You may report side effects to FDA at 1-800-FDA-1088. Where should I keep my medicine? Keep out of the reach of children. Store at room temperature between 15 and 30 degrees C (59 and 85 degrees F). Protect from light. Throw away any unused medicine after the expiration date. NOTE: This sheet is a summary. It may not cover all possible information. If you have questions about this medicine, talk to your doctor, pharmacist, or   health care provider.  2018 Elsevier/Gold Standard (2008-03-23 22:10:20)  

## 2018-11-04 NOTE — Progress Notes (Signed)
Hematology and Oncology Follow Up Visit  Sherri [name] [id number] [birth year] 52 y.o. 11/04/2018   Principle Diagnosis:  1. Pernicious anemia - anti-intrinsic factor antibodies.  2. Intermittent iron-deficiency anemia.  3. 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  4.  Diabetes mellitus -new diagnosis on 11/23/2017   Current Therapy:   1. Vitamin B12, 1 mg IM q.4 months.  2. IV iron as indicated - last received in January 2018    Interim History: Sherri [name] is here for a follow-up today.  Unfortunately, she fell in early October.  She just lost her balance.  She has stiff person syndrome.  She fell and broke the proximal bone and her fifth right finger.  She had a splint on for about a week or so.  It does seem to be getting better.  She was last seen back in July.  Her ferritin at that time was 673 with an iron saturation of 31%.  She has had no problems with fever.  She is had no change in bowel or bladder habits.  She has had no bleeding.  There is been no headache.  She does have diabetes.  She is on metformin and glyburide for this.  I guess this is being managed by her family doctor.  Her last mammogram was back in September 2019.  Everything looks fine on the mammogram.  Overall, her performance status is ECOG 1.  Medications:  Allergies as of 11/04/2018      Reactions   Ibuprofen Other (See Comments)   Does not take due to hx of renal insufficiency "I have kidney disease"    Tylenol [acetaminophen] Hives   Cannot take large quantities   Amoxicillin Diarrhea, Other (See Comments)   Lemon Flavor Swelling   Severe Lip Swelling  FRUIT per pt.     Amoxicillin Diarrhea      Medication List        Accurate as of 11/04/18  3:45 PM. Always use your most recent med list.          ACCU-CHEK SIMPLICITY TEST STRP VI by In Vitro route. Insert 1 by subcutaneous route 4 times every day check blood sugar before breakfast, lunch and dinner and bedtime     albuterol 108 (90 Base) MCG/ACT inhaler Commonly known as:  PROVENTIL HFA;VENTOLIN HFA Inhale 1 puff into the lungs every 6 (six) hours as needed. PRO AIR   baclofen 20 MG tablet Commonly known as:  LIORESAL TAKE ONE & ONE-HALF TABLETS BY MOUTH IN THE MORNING AND IN THE EVENING AND ONE AT  MIDDAY   CRANBERRY PLUS VITAMIN C 4200-20-3 MG-MG-UNIT Caps Generic drug:  Cranberry-Vitamin C-Vitamin E Take by mouth.   cyanocobalamin 1000 MCG/ML injection Commonly known as:  (VITAMIN B-12) Inject 1,000 mcg into the muscle every 3 (three) months.   desloratadine 5 MG tablet Commonly known as:  CLARINEX Take 1 tablet (5 mg total) by mouth daily.   diazepam 10 MG tablet Commonly known as:  VALIUM Take 2 tablets (20 mg total) by mouth 3 (three) times daily.   diphenhydrAMINE 25 MG tablet Commonly known as:  BENADRYL Take 25 mg by mouth at bedtime as needed for itching or allergies.   EPIPEN 2-PAK 0.3 mg/0.3 mL Soaj injection Generic drug:  EPINEPHrine USE AS DIRECTED FOR  SEVERE  ALLERGIC  REACTIONS   famotidine 40 MG tablet Commonly known as:  PEPCID Take 75 mg by mouth daily.   FLUoxetine 10 MG capsule Commonly known as:  PROZAC TAKE 1 CAPSULE BY MOUTH  DAILY   gabapentin 300 MG capsule Commonly known as:  NEURONTIN TAKE 2 CAPSULES BY MOUTH IN THE MORNING AND EVENING;  TAKE 1 CAPSULE AT MIDDAY   glipiZIDE 5 MG tablet Commonly known as:  GLUCOTROL Take 1 tablet (5 mg total) by mouth daily before breakfast.   metFORMIN 500 MG tablet Commonly known as:  GLUCOPHAGE Take 1 tablet (500 mg total) by mouth 2 (two) times daily with a meal.   OVER THE COUNTER MEDICATION I B Guard   PAZEO 0.7 % Soln Generic drug:  Olopatadine HCl Apply 1 drop to eye daily.   traMADol 50 MG tablet Commonly known as:  ULTRAM Take 1 tablet (50 mg total) by mouth every 6 (six) hours as needed.   vitamin C 1000 MG tablet Take 1,000 mg by mouth daily.   Vitamin D3 125 MCG (5000 UT) Caps Take  by mouth.   vitamin E 400 UNIT capsule Generic drug:  vitamin E Take 400 Units by mouth daily.       Allergies:  Allergies  Allergen Reactions  . Ibuprofen Other (See Comments)    Does not take due to hx of renal insufficiency "I have kidney disease"   . Tylenol [Acetaminophen] Hives    Cannot take large quantities  . Amoxicillin Diarrhea and Other (See Comments)  . Lemon Flavor Swelling    Severe Lip Swelling  FRUIT per pt.    . Amoxicillin Diarrhea    Past Medical History, Surgical history, Social history, and Family History were reviewed and updated.  Review of Systems: Review of Systems  Constitutional: Negative.   HENT: Negative.   Eyes: Negative.   Respiratory: Negative.   Cardiovascular: Negative.   Gastrointestinal: Negative.   Genitourinary: Negative.   Musculoskeletal: Positive for joint pain and myalgias.  Skin: Negative.   Neurological: Negative.   Endo/Heme/Allergies: Negative.   Psychiatric/Behavioral: Negative.       Physical Exam:  weight is 124 lb (56.2 kg). Her oral temperature is 98.3 F (36.8 C). Her blood pressure is 116/70 and her pulse is 70. Her respiration is 16 and oxygen saturation is 100%.   Wt Readings from Last 3 Encounters:  11/04/18 124 lb (56.2 kg)  10/18/18 124 lb (56.2 kg)  09/05/18 130 lb 9.6 oz (59.2 kg)    Physical Exam  Constitutional: She is oriented to person, place, and time.  HENT:  Head: Normocephalic and atraumatic.  Mouth/Throat: Oropharynx is clear and moist.  Eyes: Pupils are equal, round, and reactive to light. EOM are normal.  Neck: Normal range of motion.  Cardiovascular: Normal rate, regular rhythm and normal heart sounds.  Pulmonary/Chest: Effort normal and breath sounds normal.  Abdominal: Soft. Bowel sounds are normal.  Musculoskeletal: Normal range of motion. She exhibits no edema, tenderness or deformity.  Lymphadenopathy:    She has no cervical adenopathy.  Neurological: She is alert and  oriented to person, place, and time.  Skin: Skin is warm and dry. No rash noted. No erythema.  Psychiatric: She has a normal mood and affect. Her behavior is normal. Judgment and thought content normal.  Vitals reviewed.      Lab Results  Component Value Date   WBC 5.3 11/04/2018   HGB 11.5 (L) 11/04/2018   HCT 37.3 11/04/2018   MCV 90.3 11/04/2018   PLT 264 11/04/2018   Lab Results  Component Value Date   FERRITIN 673 (H) 06/24/2018   IRON 66 06/24/2018   TIBC  214 (L) 06/24/2018   UIBC 148 06/24/2018   IRONPCTSAT 31 06/24/2018   Lab Results  Component Value Date   RETICCTPCT 1.8 12/14/2015   RBC 4.13 11/04/2018   RETICCTABS 71.8 12/14/2015   Lab Results  Component Value Date   KPAFRELGTCHN 1.06 08/02/2011   LAMBDASER 1.49 08/02/2011   KAPLAMBRATIO 0.71 08/02/2011   Lab Results  Component Value Date   IGGSERUM 1180 08/02/2011   IGA 206 08/02/2011   IGMSERUM 198 08/02/2011   Lab Results  Component Value Date   TOTALPROTELP 6.6 08/02/2011   ALBUMINELP 57.1 08/02/2011   A1GS 4.1 08/02/2011   A2GS 9.6 08/02/2011   BETS 6.1 08/02/2011   BETA2SER 5.9 08/02/2011   GAMS 17.2 08/02/2011   MSPIKE NOT DET 08/02/2011   SPEI * 08/02/2011     Chemistry      Component Value Date/Time   NA 145 11/04/2018 1421   NA 143 09/05/2018 1535   NA 136 11/23/2017 1147   NA 144 03/22/2017 0943   K 4.5 11/04/2018 1421   K 3.9 11/23/2017 1147   K 4.2 03/22/2017 0943   CL 110 (H) 11/04/2018 1421   CL 94 (L) 11/23/2017 1147   CO2 30 11/04/2018 1421   CO2 30 11/23/2017 1147   CO2 29 03/22/2017 0943   BUN 17 11/04/2018 1421   BUN 17 09/05/2018 1535   BUN 10 11/23/2017 1147   BUN 11.1 03/22/2017 0943   CREATININE 0.70 11/04/2018 1421   CREATININE 1.0 11/23/2017 1147   CREATININE 1.0 03/22/2017 0943   GLU 88 09/05/2018 1535      Component Value Date/Time   CALCIUM 10.1 11/04/2018 1421   CALCIUM 10.1 11/23/2017 1147   CALCIUM 9.9 03/22/2017 0943   ALKPHOS 68 11/04/2018  1421   ALKPHOS 174 (H) 11/23/2017 1147   ALKPHOS 114 03/22/2017 0943   AST 20 11/04/2018 1421   AST 18 03/22/2017 0943   ALT 13 11/04/2018 1421   ALT 46 11/23/2017 1147   ALT 23 03/22/2017 0943   BILITOT 0.4 11/04/2018 1421   BILITOT 0.24 03/22/2017 0943     Impression and Plan: Ms. Erpelding is 52 yo African American female with pernicious anemia and intermittent iron deficiency anemia.   We will go ahead and give her vitamin B12 today.  We will plan to get her back in 3 more months.  I think this would be very reasonable.  I am just so glad that she has such a strong constitution and that she is really fighting hard with this stiff person syndrome.  Of note she has 4 grandchildren already.  Her daughter is certainly quite blessed to have the grandchildren so that Ms. Mcmanaman can focus on the grandchildren over the holidays.     Josph Macho, MD 11/11/20193:45 PM

## 2018-11-05 LAB — IRON AND TIBC
Iron: 117 ug/dL (ref 41–142)
Saturation Ratios: 52 % (ref 21–57)
TIBC: 225 ug/dL — ABNORMAL LOW (ref 236–444)
UIBC: 108 ug/dL — ABNORMAL LOW (ref 120–384)

## 2018-11-05 LAB — FERRITIN: Ferritin: 625 ng/mL — ABNORMAL HIGH (ref 11–307)

## 2018-11-06 ENCOUNTER — Encounter (INDEPENDENT_AMBULATORY_CARE_PROVIDER_SITE_OTHER): Payer: Self-pay | Admitting: Orthopaedic Surgery

## 2018-11-06 ENCOUNTER — Ambulatory Visit (INDEPENDENT_AMBULATORY_CARE_PROVIDER_SITE_OTHER): Payer: Medicare Other

## 2018-11-06 ENCOUNTER — Ambulatory Visit (INDEPENDENT_AMBULATORY_CARE_PROVIDER_SITE_OTHER): Payer: Medicare Other | Admitting: Orthopaedic Surgery

## 2018-11-06 ENCOUNTER — Telehealth: Payer: Self-pay | Admitting: Neurology

## 2018-11-06 VITALS — BP 114/78 | HR 81 | Ht 63.0 in | Wt 124.0 lb

## 2018-11-06 DIAGNOSIS — S62646D Nondisplaced fracture of proximal phalanx of right little finger, subsequent encounter for fracture with routine healing: Secondary | ICD-10-CM | POA: Diagnosis not present

## 2018-11-06 MED ORDER — GABAPENTIN 300 MG PO CAPS: 300.0000 mg | ORAL_CAPSULE | Freq: Two times a day (BID) | ORAL | 0 refills | Status: DC

## 2018-11-06 NOTE — Addendum Note (Signed)
Addended by: York SpanielWILLIS, Hiep Ollis K on: 11/06/2018 02:49 PM   Modules accepted: Orders

## 2018-11-06 NOTE — Telephone Encounter (Signed)
Spoke with pt.  She sts. she has been tapering off of Gabapentin as rx'd.  On 11/04/18, she decreased to 300mg  qhs.  Sts. she has not had any more pain/stiffness/spasms since decreasing Gabapentin. Sts. she has noted worsening balance since Monday and believes she did better on Gabapentin 300mg  bid.  Would like to know if she can continue Gabapentin 300mg  bid for now. Denies fall since September, when she broke her finger.  Will check with CKW/fim

## 2018-11-06 NOTE — Telephone Encounter (Signed)
The patient has tapered down to 1 gabapentin daily, she feels less secure on her feet, she felt better taking 1 twice daily, we will go back to this dose and leave her on a total of 600 mg daily.

## 2018-11-06 NOTE — Telephone Encounter (Signed)
Pt is asking for a call from RN to discuss her management of gabapentin (NEURONTIN) 300 MG capsule.  Pt states what works best is taking 1 in the a.m. And 1 in the p.m. Please call

## 2018-11-06 NOTE — Progress Notes (Signed)
Office Visit Note   Patient: Sherri [name]           Date of Birth: Apr 26, 1966           MRN: [id number] Visit Date: 11/06/2018              Requested by: Arnette FeltsMoore, Janece, FNP [address] [place], [place] [id number] PCP: Arnette FeltsMoore, Janece, FNP   Assessment & Plan: Visit Diagnoses:  1. Closed nondisplaced fracture of proximal phalanx of right little finger with routine healing, subsequent encounter     Plan: Patient returns post fifth finger fracture proximal phalanx.  She is healed with some extension deformity but is still able to get her fingertip down to her palm.  No rotational deformity to the finger.  She stopped buddy  taping over the weekend and using her hand normally with activities of daily living.  We can check her back again on an as-needed basis.  Follow-Up Instructions: No follow-ups on file.   Orders:  Orders Placed This Encounter  Procedures  . XR Finger Ring Right   No orders of the defined types were placed in this encounter.     Procedures: No procedures performed   Clinical Data: No additional findings.   Subjective: Chief Complaint  Patient presents with  . Right Little Finger - Fracture, Follow-up  . Right Ring Finger - Follow-up    HPI patient 7 weeks post right fifth finger fracture.  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  She stopped buddy taping her finger over the weekend she states..  She denies pain.  Occasionally she is what has woke up in [place] night with some spasms in her hand.    Review of Systems reviewed updated unchanged from last office visit.   Objective: Vital Signs: BP 114/78   Pulse 81   Ht 5\' 3"  (1.6 m)   Wt 124 lb (56.2 kg)   LMP 07/29/2012   BMI 21.97 kg/m   Physical Exam  Constitutional: She is oriented to person, place, and time. She appears well-developed.  HENT:  Head: Normocephalic.  Right Ear: External ear normal.  Left Ear: External ear normal.  Eyes:  Pupils are equal, round, and reactive to light.  Neck: No tracheal deviation present. No thyromegaly present.  Cardiovascular: Normal rate.  Pulmonary/Chest: Effort normal.  Abdominal: Soft.  Neurological: She is alert and oriented to person, place, and time.  Skin: Skin is warm and dry.  Psychiatric: She has a normal mood and affect. Her behavior is normal.    Ortho Exam patient flexes with fingertip to palm.  She has healed with extension deformity no rotational deformity and no radial ulnar deviation.  She can grip grabbing handshake without pain.  Specialty Comments:  No specialty comments available.  Imaging: No results found.   PMFS History: Patient Active Problem List   Diagnosis Date Noted  . Closed nondisplaced fracture of proximal phalanx of right little finger 10/18/2018  . Keloid 10/10/2018  . Diabetes type 2, controlled (HCC) 10/10/2018  . Rash and nonspecific skin eruption 10/10/2018  . Newly diagnosed diabetes (HCC) 01/21/2018  . Depression 01/02/2017  . History of DVT (deep vein thrombosis) 01/02/2017  . Chronic pain syndrome 01/02/2017  . Cocaine abuse (HCC) 01/02/2017  . Dysphagia   . Adjustment insomnia   . Distal radius fracture, right 08/16/2016  . Postconcussion syndrome 12/30/2014  . Dizziness 12/18/2014  . Tremor 03/11/2014  . 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 Hiatal hernia 03/21/2013  . Anemia, iron deficiency 01/29/2013  . Spasm of muscle 09/02/2012  . Benzodiazepine withdrawal (HCC) 08/30/2012  . CAP (community acquired pneumonia) 08/29/2012  . Hypernatremia 08/28/2012  . Metabolic acidosis 08/28/2012  . Leukocytosis 08/28/2012  . UTI (lower urinary tract infection) 08/28/2012  . Anemia 08/28/2012  . Acute back pain 08/27/2012  . Rhabdomyolysis 08/27/2012  . ARF (acute renal failure) (HCC) 08/27/2012  . Hypokalemia 08/27/2012  . Asthma 08/27/2012  . Chronic back pain   . Pernicious anemia 10/30/2011  . DEGENERATIVE DISC DISEASE  08/18/2010  . DEEP VEIN THROMBOSIS/PHLEBITIS 07/28/2010  . Asthma with bronchitis 07/28/2010  . URTICARIA 07/28/2010   Past Medical History:  Diagnosis Date  . Angioedema   . Anxiety   . Arthritis   . Asthma   . CAP (community acquired pneumonia) 08/29/2012  . Chronic back pain   . Chronic kidney disease   . Cocaine abuse (HCC)   . Depression   . Diarrhea   . DVT (deep venous thrombosis) (HCC)   . Environmental allergies   . Fall   . GERD (gastroesophageal reflux disease)   . Heart murmur    has been told once that she has a heart murmur, but has never had any problems  . Hiatal hernia 03/21/2013  . Lumbar herniated disc   . Peripheral vascular disease (HCC)   . Pernicious anemia 10/30/2011  . Postconcussion syndrome 12/30/2014  . Seasonal allergies   . Stiff person syndrome   . Urticaria     Family History  Problem Relation Age of Onset  . Cancer Mother   . Other Mother   . COPD Father   . Asthma Brother   . Cancer Brother        colon  . Heart attack Brother   . Seizures Brother     Past Surgical History:  Procedure Laterality Date  . BUNIONECTOMY Left   . colonoscopy  07/09/15  . inguinal hernia 1983  1983  . OPEN REDUCTION INTERNAL FIXATION (ORIF) DISTAL RADIAL FRACTURE Right 08/16/2016   Procedure: OPEN REDUCTION INTERNAL FIXATION (ORIF) DISTAL RADIAL FRACTURE;  Surgeon: Eldred Manges, MD;  Location: [facility] OR;  Service: Orthopedics;  Laterality: Right;   Social History   Occupational History  . Occupation: disabled  Tobacco Use  . Smoking status: Never Smoker  . Smokeless tobacco: Never Used  . Tobacco comment: Never Used Tobacco  Substance and Sexual Activity  . Alcohol use: Yes    Alcohol/week: 0.0 standard drinks    Comment: Occasional  . Drug use: Yes    Types: Cocaine    Comment: in the last two weeks as of 08/08/17  . Sexual activity: Not on file

## 2018-11-20 ENCOUNTER — Other Ambulatory Visit: Payer: Medicare Other

## 2018-11-29 ENCOUNTER — Telehealth: Payer: Self-pay

## 2018-11-29 NOTE — Telephone Encounter (Signed)
Patient called wanting to give you an update she stated she has an appointment with us on 12/12 with us. She stated she checked her blood sugar this morning it was 98. She took her metformin then had oatmeal (maple brown sugar) and she checked her blood sugar again after her nap and it was 77. She stated her weight has dropped more due to her not having an appetite. YRL,RMA

## 2018-11-30 ENCOUNTER — Other Ambulatory Visit: Payer: Self-pay | Admitting: Nurse Practitioner

## 2018-12-02 NOTE — Telephone Encounter (Signed)
Patient notified YRL,RMA 

## 2018-12-02 NOTE — Telephone Encounter (Signed)
Okay, continue to take the metformin as directed and we will check her HgbA1c at her visit.

## 2018-12-04 ENCOUNTER — Ambulatory Visit: Payer: Medicare Other | Admitting: Obstetrics

## 2018-12-05 ENCOUNTER — Ambulatory Visit: Payer: Medicare Other | Admitting: Nurse Practitioner

## 2018-12-19 ENCOUNTER — Ambulatory Visit: Payer: Medicare Other | Admitting: Obstetrics

## 2018-12-20 ENCOUNTER — Other Ambulatory Visit: Payer: Self-pay | Admitting: Neurology

## 2018-12-20 MED ORDER — BACLOFEN 20 MG PO TABS: ORAL_TABLET | ORAL | 3 refills | Status: DC

## 2018-12-20 MED ORDER — GABAPENTIN 300 MG PO CAPS: 300.0000 mg | ORAL_CAPSULE | Freq: Two times a day (BID) | ORAL | 0 refills | Status: DC

## 2018-12-20 MED ORDER — DIAZEPAM 10 MG PO TABS: 20.0000 mg | ORAL_TABLET | Freq: Three times a day (TID) | ORAL | 0 refills | Status: DC

## 2018-12-20 NOTE — Telephone Encounter (Signed)
Received refill request for pt's gabapentin and fluoxetine. I called pt. She is taking gabapentin 300mg  BID. She also needs refills on baclofen and diazepam sent to [facility]. I will cancel the refill order that was sent to [facility] and send it to [facility]. I  advised her that [facility]. Pt verbalized understanding.  I called [facility] and cancelled the gabapentin order from today.  [place] [facility] checked. Pt is up to date on her appts. Will send RX for diazepam to Dr. Epimenio FootSater in Dr. Anne HahnWillis' absence for review and approval.

## 2018-12-20 NOTE — Addendum Note (Signed)
Addended by: Geronimo RunningINKINS, Kaspian Muccio A on: 12/20/2018 12:47 PM   Modules accepted: Orders

## 2018-12-20 NOTE — Addendum Note (Signed)
Addended by: Geronimo RunningINKINS, Heaven Meeker A on: 12/20/2018 12:32 PM   Modules accepted: Orders

## 2019-01-01 ENCOUNTER — Ambulatory Visit: Payer: Medicare Other | Admitting: Obstetrics

## 2019-01-07 ENCOUNTER — Encounter (HOSPITAL_COMMUNITY): Payer: Self-pay | Admitting: Emergency Medicine

## 2019-01-07 ENCOUNTER — Ambulatory Visit (HOSPITAL_COMMUNITY)
Admission: EM | Admit: 2019-01-07 | Discharge: 2019-01-07 | Disposition: A | Discharge status: Home / Self Care | Payer: Medicare Other | Attending: Family Medicine | Admitting: Family Medicine

## 2019-01-07 ENCOUNTER — Other Ambulatory Visit: Payer: Self-pay

## 2019-01-07 DIAGNOSIS — J069 Acute upper respiratory infection, unspecified: Secondary | ICD-10-CM

## 2019-01-07 DIAGNOSIS — R062 Wheezing: Secondary | ICD-10-CM | POA: Insufficient documentation

## 2019-01-07 DIAGNOSIS — R059 Cough, unspecified: Secondary | ICD-10-CM

## 2019-01-07 DIAGNOSIS — R05 Cough: Secondary | ICD-10-CM | POA: Insufficient documentation

## 2019-01-07 MED ORDER — BENZONATATE 100 MG PO CAPS: 100.0000 mg | ORAL_CAPSULE | Freq: Three times a day (TID) | ORAL | 0 refills | Status: DC

## 2019-01-07 MED ORDER — PREDNISONE 10 MG (21) PO TBPK: ORAL_TABLET | Freq: Every day | ORAL | 0 refills | Status: DC

## 2019-01-07 NOTE — ED Triage Notes (Signed)
Cough and yellow phlegm and tickle in throat for a week.

## 2019-01-07 NOTE — Discharge Instructions (Signed)
Follow up with your primary care doctor or here if you are not seeing improvement of your symptoms over the next several days, sooner if you feel you are worsening. ° °Caring for yourself: °Get plenty of rest. °Drink plenty of fluids, enough so that your urine is light yellow or clear like water. If you have kidney, heart, or liver disease and have to limit fluids, talk with your doctor before you increase the amount of fluids you drink. °Take an over-the-counter pain medicine if needed, such as acetaminophen (Tylenol), ibuprofen (Advil, Motrin), or naproxen (Aleve), to relieve fever, headache, and muscle aches. Read and follow all instructions on the label. No one younger than 20 should take aspirin. It has been linked to Reye syndrome, a serious illness. °Before you use over the counter cough and cold medicines, check the label. These medicines may not be safe for children younger than age 6 or for people with certain health problems. °If the skin around your nose and lips becomes sore, put some petroleum jelly on the area. ° °Avoid spreading the flu: °Wash your hands regularly, and keep your hands away from your face.  °Stay home from school, work, and other public places until you are feeling better and your fever has been gone for at least 24 hours. The fever needs to have gone away on its own without the help of medicine. °

## 2019-01-07 NOTE — ED Provider Notes (Signed)
[facility]   [id number] 01/07/19 Arrival Time: 1343  ASSESSMENT & PLAN:  1. Viral upper respiratory tract infection   2. Cough   3. Wheezing    See AVS for d/c instructions. No suspicion for pneumonia. No indication for chest imaging at this time. Discussed.  Meds ordered this encounter  Medications  . benzonatate (TESSALON) 100 MG capsule    Sig: Take 1 capsule (100 mg total) by mouth every 8 (eight) hours.    Dispense:  21 capsule    Refill:  0  . predniSONE (STERAPRED UNI-PAK 21 TAB) 10 MG (21) TBPK tablet    Sig: Take by mouth daily. Take as directed.    Dispense:  21 tablet    Refill:  0   Discussed typical duration of symptoms. OTC symptom care as needed. Ensure adequate fluid intake and rest. May f/u with PCP or here as needed.  Reviewed expectations re: course of current medical issues. Questions answered. Outlined signs and symptoms indicating need for more acute intervention. Patient verbalized understanding. After Visit Summary given.   SUBJECTIVE: History from: patient.  Sherri [name] is a 53 y.o. female who presents with complaint of nasal congestion, post-nasal drainage, and a persistent dry cough; without sore throat. Onset abrupt, about 5 days ago. Overall with mild fatigue and without body aches. SOB: none. Wheezing: mild to moderate and sporadic; albuterol inhaler with temporary relief. Fever: none reported. Overall normal PO intake without n/v. Known sick contacts: no. No specific or significant aggravating or alleviating factors reported. OTC treatment: none. Received flu shot this year: no.  Social History   Tobacco Use  Smoking Status Never Smoker  Smokeless Tobacco Never Used  Tobacco Comment   Never Used Tobacco    ROS: As per HPI. All other systems negative   OBJECTIVE:  Vitals:   01/07/19 1410  BP: 115/80  Pulse: 80  Resp: 18  Temp: 98.5 F (36.9 C)  TempSrc: Oral  SpO2: 97%     General appearance: alert;  appears fatigued HEENT: nasal congestion; clear runny nose; throat irritation secondary to post-nasal drainage Neck: supple without LAD CV: RRR Lungs: unlabored respirations, symmetrical air entry with mild bilateral wheezing; cough: mild Abd: soft Ext: no LE edema Skin: warm and dry Psychological: alert and cooperative; normal mood and affect   Allergies  Allergen Reactions  . Ibuprofen Other (See Comments)    Does not take due to hx of renal insufficiency "I have kidney disease"   . Tylenol [Acetaminophen] Hives    Cannot take large quantities  . Amoxicillin Diarrhea and Other (See Comments)  . Lemon Flavor Swelling    Severe Lip Swelling  FRUIT per pt.    . Amoxicillin Diarrhea    Past Medical History:  Diagnosis Date  . Angioedema   . Anxiety   . Arthritis   . Asthma   . CAP (community acquired pneumonia) 08/29/2012  . Chronic back pain   . Chronic kidney disease   . Cocaine abuse (HCC)   . Depression   . Diarrhea   . DVT (deep venous thrombosis) (HCC)   . Environmental allergies   . Fall   . GERD (gastroesophageal reflux disease)   . Heart murmur    has been told once that she has a heart murmur, but has never had any problems  . Hiatal hernia 03/21/2013  . Lumbar herniated disc   . Peripheral vascular disease (HCC)   . Pernicious anemia 10/30/2011  . Postconcussion syndrome 12/30/2014  .  Seasonal allergies   . Stiff person syndrome   . Urticaria    Family History  Problem Relation Age of Onset  . Cancer Mother   . Other Mother   . COPD Father   . Asthma Brother   . Cancer Brother        colon  . Heart attack Brother   . Seizures Brother    Social History   Socioeconomic History  . Marital status: Divorced    Spouse name: Not on file  . Number of children: 1  . Years of education: college  . Highest education level: Not on file  Occupational History  . Occupation: disabled  Social Needs  . Financial resource strain: Not on file  . Food  insecurity:    Worry: Not on file    Inability: Not on file  . Transportation needs:    Medical: Not on file    Non-medical: Not on file  Tobacco Use  . Smoking status: Never Smoker  . Smokeless tobacco: Never Used  . Tobacco comment: Never Used Tobacco  Substance and Sexual Activity  . Alcohol use: Yes    Alcohol/week: 0.0 standard drinks    Comment: Occasional  . Drug use: Yes    Types: Cocaine    Comment: in the last two weeks as of 08/08/17  . Sexual activity: Not on file  Lifestyle  . Physical activity:    Days per week: Not on file    Minutes per session: Not on file  . Stress: Not on file  Relationships  . Social connections:    Talks on phone: Not on file    Gets together: Not on file    Attends religious service: Not on file    Active member of club or organization: Not on file    Attends meetings of clubs or organizations: Not on file    Relationship status: Not on file  . Intimate partner violence:    Fear of current or ex partner: Not on file    Emotionally abused: Not on file    Physically abused: Not on file    Forced sexual activity: Not on file  Other Topics Concern  . Not on file  Social History Narrative   Patient is right and left handed.   Patient drinks some caffeine occasionally.           Mardella LaymanHagler, Tersa Fotopoulos, MD 01/07/19 1423

## 2019-01-14 ENCOUNTER — Ambulatory Visit: Payer: Medicare Other | Admitting: Obstetrics & Gynecology

## 2019-01-24 ENCOUNTER — Ambulatory Visit: Payer: Medicare Other | Admitting: Obstetrics & Gynecology

## 2019-02-03 ENCOUNTER — Ambulatory Visit: Payer: Medicare Other | Admitting: Obstetrics

## 2019-02-04 ENCOUNTER — Other Ambulatory Visit: Payer: Medicare Other

## 2019-02-04 ENCOUNTER — Ambulatory Visit: Payer: Medicare Other | Admitting: Family

## 2019-02-04 ENCOUNTER — Ambulatory Visit: Payer: Medicare Other

## 2019-02-07 ENCOUNTER — Ambulatory Visit: Payer: Medicare Other

## 2019-02-07 ENCOUNTER — Other Ambulatory Visit: Payer: Medicare Other

## 2019-02-07 ENCOUNTER — Ambulatory Visit: Payer: Medicare Other | Admitting: Family

## 2019-02-14 ENCOUNTER — Ambulatory Visit: Payer: Medicare Other | Admitting: Obstetrics

## 2019-02-16 ENCOUNTER — Other Ambulatory Visit: Payer: Self-pay | Admitting: Neurology

## 2019-02-25 ENCOUNTER — Inpatient Hospital Stay: Payer: Medicare Other | Attending: Family | Admitting: Family

## 2019-02-25 ENCOUNTER — Inpatient Hospital Stay: Payer: Medicare Other

## 2019-02-25 ENCOUNTER — Other Ambulatory Visit: Payer: Self-pay

## 2019-02-25 VITALS — BP 133/64 | HR 66 | Temp 98.3°F | Resp 18 | Ht 63.0 in | Wt 113.6 lb

## 2019-02-25 DIAGNOSIS — D510 Vitamin B12 deficiency anemia due to intrinsic factor deficiency: Secondary | ICD-10-CM | POA: Insufficient documentation

## 2019-02-25 DIAGNOSIS — K219 Gastro-esophageal reflux disease without esophagitis: Secondary | ICD-10-CM | POA: Diagnosis not present

## 2019-02-25 DIAGNOSIS — R5383 Other fatigue: Secondary | ICD-10-CM | POA: Insufficient documentation

## 2019-02-25 DIAGNOSIS — Z79899 Other long term (current) drug therapy: Secondary | ICD-10-CM | POA: Insufficient documentation

## 2019-02-25 DIAGNOSIS — Z86718 Personal history of other venous thrombosis and embolism: Secondary | ICD-10-CM

## 2019-02-25 DIAGNOSIS — Z7952 Long term (current) use of systemic steroids: Secondary | ICD-10-CM | POA: Diagnosis not present

## 2019-02-25 DIAGNOSIS — D500 Iron deficiency anemia secondary to blood loss (chronic): Secondary | ICD-10-CM

## 2019-02-25 LAB — CBC WITH DIFFERENTIAL (CANCER CENTER ONLY)
ABS IMMATURE GRANULOCYTES: 0.01 10*3/uL (ref 0.00–0.07)
BASOS PCT: 0 %
Basophils Absolute: 0 10*3/uL (ref 0.0–0.1)
EOS ABS: 0 10*3/uL (ref 0.0–0.5)
Eosinophils Relative: 0 %
HCT: 34.9 % — ABNORMAL LOW (ref 36.0–46.0)
Hemoglobin: 11.2 g/dL — ABNORMAL LOW (ref 12.0–15.0)
Immature Granulocytes: 0 %
Lymphocytes Relative: 25 %
Lymphs Abs: 1.3 10*3/uL (ref 0.7–4.0)
MCH: 28.6 pg (ref 26.0–34.0)
MCHC: 32.1 g/dL (ref 30.0–36.0)
MCV: 89.3 fL (ref 80.0–100.0)
MONO ABS: 0.3 10*3/uL (ref 0.1–1.0)
Monocytes Relative: 6 %
Neutro Abs: 3.7 10*3/uL (ref 1.7–7.7)
Neutrophils Relative %: 69 %
PLATELETS: 252 10*3/uL (ref 150–400)
RBC: 3.91 MIL/uL (ref 3.87–5.11)
RDW: 12.9 % (ref 11.5–15.5)
WBC Count: 5.3 10*3/uL (ref 4.0–10.5)
nRBC: 0 % (ref 0.0–0.2)

## 2019-02-25 LAB — CMP (CANCER CENTER ONLY)
ALT: 7 U/L (ref 0–44)
AST: 11 U/L — AB (ref 15–41)
Albumin: 4.2 g/dL (ref 3.5–5.0)
Alkaline Phosphatase: 68 U/L (ref 38–126)
Anion gap: 5 (ref 5–15)
BUN: 16 mg/dL (ref 6–20)
CO2: 30 mmol/L (ref 22–32)
Calcium: 10 mg/dL (ref 8.9–10.3)
Chloride: 107 mmol/L (ref 98–111)
Creatinine: 1.02 mg/dL — ABNORMAL HIGH (ref 0.44–1.00)
GFR, Est AFR Am: >60 mL/min (ref >60)
Glucose, Bld: 87 mg/dL (ref 70–99)
POTASSIUM: 4.4 mmol/L (ref 3.5–5.1)
Sodium: 142 mmol/L (ref 135–145)
Total Bilirubin: 0.3 mg/dL (ref 0.3–1.2)
Total Protein: 6.9 g/dL (ref 6.5–8.1)

## 2019-02-25 LAB — VITAMIN B12: Vitamin B-12: 256 pg/mL (ref 180–914)

## 2019-02-25 MED ORDER — CYANOCOBALAMIN 1000 MCG/ML IJ SOLN
1000.0000 ug | Freq: Once | INTRAMUSCULAR | Status: AC
  Administered 2019-02-25: 1000 ug via INTRAMUSCULAR

## 2019-02-25 MED ORDER — CYANOCOBALAMIN 1000 MCG/ML IJ SOLN
INTRAMUSCULAR | Status: AC
  Filled 2019-02-25: qty 1

## 2019-02-25 NOTE — Progress Notes (Signed)
Hematology and Oncology Follow Up Visit  Sherri [name] [id number] 1966-01-08 53 y.o. 02/25/2019   Principle Diagnosis:  Pernicious anemia - anti-intrinsic factor antibodies Intermittent iron - deficiency anemia History of deep venous thrombosis of the right leg  Current Therapy:   Vitamin B12, 1 mg IM Q3 months IV iron as indicated   Interim History:  [name] is here today for follow-up. She is symptomatic with fatigue.  No episodes of bleeding, no bruising or petechiae.  No fever, chills, n/v, cough, rash, dizziness, SOB, chest pain, palpitations, abdominal pain or changes in bowel or bladder habits.  No swelling, numbness or tingling in her extremities at this time.  She still has issues with balance and has had several falls this year. The last fall was in early February and she did not go to the ED. She is ambulating with a cane for support.  She is working on [employer] production planner and going to the gym 2-3 days a week to exercise.  No lymphadenopathy noted on exam.  Her appetite comes and goes. She is staying well hydrated. Her weight is stable.  She has GERD and takes Pepcid as needed.   ECOG Performance Status: 1 - Symptomatic but completely ambulatory  Medications:  Allergies as of 02/25/2019      Reactions   Ibuprofen Other (See Comments)   Does not take due to hx of renal insufficiency "I have kidney disease"    Lemon Flavor Swelling   Severe Lip Swelling  FRUIT per pt.     Amoxicillin Diarrhea, Other (See Comments)   Tylenol [acetaminophen] Hives   Cannot take large quantities   Amoxicillin Diarrhea      Medication List       Accurate as of February 25, 2019  2:55 PM. Always use your most recent med list.        ACCU-CHEK SIMPLICITY TEST STRP VI by In Vitro route. Insert 1 by subcutaneous route 4 times every day check blood sugar before breakfast, lunch and dinner and bedtime   albuterol 108 (90 Base) MCG/ACT inhaler Commonly known as:  PROVENTIL HFA;VENTOLIN  HFA Inhale 1 puff into the lungs every 6 (six) hours as needed. PRO AIR   baclofen 20 MG tablet Commonly known as:  LIORESAL TAKE ONE & ONE-HALF TABLETS BY MOUTH IN THE MORNING AND IN THE EVENING AND ONE AT  MIDDAY   benzonatate 100 MG capsule Commonly known as:  TESSALON Take 1 capsule (100 mg total) by mouth every 8 (eight) hours.   CRANBERRY PLUS VITAMIN C 4200-20-3 MG-MG-UNIT Caps Generic drug:  Cranberry-Vitamin C-Vitamin E Take by mouth.   cyanocobalamin 1000 MCG/ML injection Commonly known as:  (VITAMIN B-12) Inject 1,000 mcg into the muscle every 3 (three) months.   desloratadine 5 MG tablet Commonly known as:  CLARINEX Take 1 tablet (5 mg total) by mouth daily.   diazepam 10 MG tablet Commonly known as:  VALIUM Take 2 tablets (20 mg total) by mouth 3 (three) times daily.   diphenhydrAMINE 25 MG tablet Commonly known as:  BENADRYL Take 25 mg by mouth at bedtime as needed for itching or allergies.   EPIPEN 2-PAK 0.3 mg/0.3 mL Soaj injection Generic drug:  EPINEPHrine USE AS DIRECTED FOR  SEVERE  ALLERGIC  REACTIONS   famotidine 40 MG tablet Commonly known as:  PEPCID Take 75 mg by mouth daily.   FLUoxetine 10 MG capsule Commonly known as:  PROZAC TAKE 1 CAPSULE BY MOUTH  DAILY   gabapentin 300 MG capsule  Commonly known as:  NEURONTIN TAKE 1 CAPSULE BY MOUTH TWO TIMES DAILY   glipiZIDE 5 MG tablet Commonly known as:  GLUCOTROL TAKE 1 TABLET BY MOUTH EVERY DAY BEFORE MEALS   metFORMIN 500 MG tablet Commonly known as:  GLUCOPHAGE TAKE 1 TABLET BY MOUTH TWICE A DAY WITH MORNING AND EVENING MEALS   OVER THE COUNTER MEDICATION I B Guard   PAZEO 0.7 % Soln Generic drug:  Olopatadine HCl Apply 1 drop to eye daily.   predniSONE 10 MG (21) Tbpk tablet Commonly known as:  STERAPRED UNI-PAK 21 TAB Take by mouth daily. Take as directed.   traMADol 50 MG tablet Commonly known as:  ULTRAM Take 1 tablet (50 mg total) by mouth every 6 (six) hours as needed.    vitamin C 1000 MG tablet Take 1,000 mg by mouth daily.   Vitamin D3 125 MCG (5000 UT) Caps Take by mouth.   vitamin E 400 UNIT capsule Generic drug:  vitamin E Take 400 Units by mouth daily.       Allergies:  Allergies  Allergen Reactions  . Ibuprofen Other (See Comments)    Does not take due to hx of renal insufficiency "I have kidney disease"   . Lemon Flavor Swelling    Severe Lip Swelling  FRUIT per pt.    . Amoxicillin Diarrhea and Other (See Comments)  . Tylenol [Acetaminophen] Hives    Cannot take large quantities  . Amoxicillin Diarrhea    Past Medical History, Surgical history, Social history, and Family History were reviewed and updated.  Review of Systems: All other 10 point review of systems is negative.   Physical Exam:  vitals were not taken for this visit.   Wt Readings from Last 3 Encounters:  11/06/18 124 lb (56.2 kg)  11/04/18 124 lb (56.2 kg)  10/18/18 124 lb (56.2 kg)    Ocular: Sclerae unicteric, pupils equal, round and reactive to light Ear-nose-throat: Oropharynx clear, dentition fair Lymphatic: No cervical, supraclavicular or axillary adenopathy Lungs no rales or rhonchi, good excursion bilaterally Heart regular rate and rhythm, no murmur appreciated Abd soft, nontender, positive bowel sounds, no liver or spleen tip palpated on exam, no fluid wave  MSK no focal spinal tenderness, no joint edema Neuro: non-focal, well-oriented, appropriate affect Breasts: Deferred   Lab Results  Component Value Date   WBC 5.3 02/25/2019   HGB 11.2 (L) 02/25/2019   HCT 34.9 (L) 02/25/2019   MCV 89.3 02/25/2019   PLT 252 02/25/2019   Lab Results  Component Value Date   FERRITIN 625 (H) 11/04/2018   IRON 117 11/04/2018   TIBC 225 (L) 11/04/2018   UIBC 108 (L) 11/04/2018   IRONPCTSAT 52 11/04/2018   Lab Results  Component Value Date   RETICCTPCT 1.8 12/14/2015   RBC 3.91 02/25/2019   RETICCTABS 71.8 12/14/2015   Lab Results  Component  Value Date   KPAFRELGTCHN 1.06 08/02/2011   LAMBDASER 1.49 08/02/2011   KAPLAMBRATIO 0.71 08/02/2011   Lab Results  Component Value Date   IGGSERUM 1180 08/02/2011   IGA 206 08/02/2011   IGMSERUM 198 08/02/2011   Lab Results  Component Value Date   TOTALPROTELP 6.6 08/02/2011   ALBUMINELP 57.1 08/02/2011   A1GS 4.1 08/02/2011   A2GS 9.6 08/02/2011   BETS 6.1 08/02/2011   BETA2SER 5.9 08/02/2011   GAMS 17.2 08/02/2011   MSPIKE NOT DET 08/02/2011   SPEI * 08/02/2011     Chemistry      Component Value  Date/Time   NA 142 02/25/2019 1340   NA 143 09/05/2018 1535   NA 136 11/23/2017 1147   NA 144 03/22/2017 0943   K 4.4 02/25/2019 1340   K 3.9 11/23/2017 1147   K 4.2 03/22/2017 0943   CL 107 02/25/2019 1340   CL 94 (L) 11/23/2017 1147   CO2 30 02/25/2019 1340   CO2 30 11/23/2017 1147   CO2 29 03/22/2017 0943   BUN 16 02/25/2019 1340   BUN 17 09/05/2018 1535   BUN 10 11/23/2017 1147   BUN 11.1 03/22/2017 0943   CREATININE 1.02 (H) 02/25/2019 1340   CREATININE 1.0 11/23/2017 1147   CREATININE 1.0 03/22/2017 0943   GLU 88 09/05/2018 1535      Component Value Date/Time   CALCIUM 10.0 02/25/2019 1340   CALCIUM 10.1 11/23/2017 1147   CALCIUM 9.9 03/22/2017 0943   ALKPHOS 68 02/25/2019 1340   ALKPHOS 174 (H) 11/23/2017 1147   ALKPHOS 114 03/22/2017 0943   AST 11 (L) 02/25/2019 1340   AST 18 03/22/2017 0943   ALT 7 02/25/2019 1340   ALT 46 11/23/2017 1147   ALT 23 03/22/2017 0943   BILITOT 0.3 02/25/2019 1340   BILITOT 0.24 03/22/2017 0943       Impression and Plan: Ms. Salis is a pleasant 53 yo African American female with both B 12 and intermittent iron deficiency. She is symptomatic with fatigued.  She received B 12 today.  We will see what her iron studies show and bring her back in for infusion if needed.  We will plan to see her back in another 3 months for follow-up.  She will contact our office with any questions or concerns. We can certainly see her  sooner if need be.   [name], NP 3/3/20202:55 PM

## 2019-02-25 NOTE — Patient Instructions (Signed)
Cyanocobalamin, Vitamin B12 injection What is this medicine? CYANOCOBALAMIN (sye an oh koe BAL a min) is a man made form of vitamin B12. Vitamin B12 is used in the growth of healthy blood cells, nerve cells, and proteins in the body. It also helps with the metabolism of fats and carbohydrates. This medicine is used to treat people who can not absorb vitamin B12. This medicine may be used for other purposes; ask your health care provider or pharmacist if you have questions. COMMON BRAND NAME(S): B-12 Compliance Kit, B-12 Injection Kit, Cyomin, LA-12, Nutri-Twelve, Physicians EZ Use B-12, Primabalt What should I tell my health care provider before I take this medicine? They need to know if you have any of these conditions: -kidney disease -Leber's disease -megaloblastic anemia -an unusual or allergic reaction to cyanocobalamin, cobalt, other medicines, foods, dyes, or preservatives -pregnant or trying to get pregnant -breast-feeding How should I use this medicine? This medicine is injected into a muscle or deeply under the skin. It is usually given by a health care professional in a clinic or doctor's office. However, your doctor may teach you how to inject yourself. Follow all instructions. Talk to your pediatrician regarding the use of this medicine in children. Special care may be needed. Overdosage: If you think you have taken too much of this medicine contact a poison control center or emergency room at once. NOTE: This medicine is only for you. Do not share this medicine with others. What if I miss a dose? If you are given your dose at a clinic or doctor's office, call to reschedule your appointment. If you give your own injections and you miss a dose, take it as soon as you can. If it is almost time for your next dose, take only that dose. Do not take double or extra doses. What may interact with this medicine? -colchicine -heavy alcohol intake This list may not describe all possible  interactions. Give your health care provider a list of all the medicines, herbs, non-prescription drugs, or dietary supplements you use. Also tell them if you smoke, drink alcohol, or use illegal drugs. Some items may interact with your medicine. What should I watch for while using this medicine? Visit your doctor or health care professional regularly. You may need blood work done while you are taking this medicine. You may need to follow a special diet. Talk to your doctor. Limit your alcohol intake and avoid smoking to get the best benefit. What side effects may I notice from receiving this medicine? Side effects that you should report to your doctor or health care professional as soon as possible: -allergic reactions like skin rash, itching or hives, swelling of the face, lips, or tongue -blue tint to skin -chest tightness, pain -difficulty breathing, wheezing -dizziness -red, swollen painful area on the leg Side effects that usually do not require medical attention (report to your doctor or health care professional if they continue or are bothersome): -diarrhea -headache This list may not describe all possible side effects. Call your doctor for medical advice about side effects. You may report side effects to FDA at 1-800-FDA-1088. Where should I keep my medicine? Keep out of the reach of children. Store at room temperature between 15 and 30 degrees C (59 and 85 degrees F). Protect from light. Throw away any unused medicine after the expiration date. NOTE: This sheet is a summary. It may not cover all possible information. If you have questions about this medicine, talk to your doctor, pharmacist, or   health care provider.  2019 Elsevier/Gold Standard (2008-03-23 22:10:20)  

## 2019-02-26 LAB — IRON AND TIBC
Iron: 65 ug/dL (ref 41–142)
Saturation Ratios: 33 % (ref 21–57)
TIBC: 196 ug/dL — AB (ref 236–444)
UIBC: 131 ug/dL (ref 120–384)

## 2019-02-26 LAB — FERRITIN: FERRITIN: 518 ng/mL — AB (ref 11–307)

## 2019-02-27 ENCOUNTER — Ambulatory Visit (INDEPENDENT_AMBULATORY_CARE_PROVIDER_SITE_OTHER): Payer: Medicare Other | Admitting: Obstetrics

## 2019-02-27 ENCOUNTER — Encounter: Payer: Self-pay | Admitting: Obstetrics

## 2019-02-27 ENCOUNTER — Other Ambulatory Visit (HOSPITAL_COMMUNITY)
Admission: RE | Admit: 2019-02-27 | Discharge: 2019-02-27 | Disposition: A | Discharge status: Home / Self Care | Payer: Medicare Other | Source: Ambulatory Visit | Attending: Obstetrics | Admitting: Obstetrics

## 2019-02-27 VITALS — BP 120/79 | HR 71 | Wt 109.8 lb

## 2019-02-27 DIAGNOSIS — N950 Postmenopausal bleeding: Secondary | ICD-10-CM

## 2019-02-27 DIAGNOSIS — Z01419 Encounter for gynecological examination (general) (routine) without abnormal findings: Secondary | ICD-10-CM | POA: Insufficient documentation

## 2019-02-27 DIAGNOSIS — N898 Other specified noninflammatory disorders of vagina: Secondary | ICD-10-CM

## 2019-02-27 DIAGNOSIS — N930 Postcoital and contact bleeding: Secondary | ICD-10-CM

## 2019-02-27 DIAGNOSIS — Z124 Encounter for screening for malignant neoplasm of cervix: Secondary | ICD-10-CM

## 2019-02-27 NOTE — Progress Notes (Signed)
Pt states she has vaginal spotting after IC. Pt is due for a pap smear.

## 2019-02-27 NOTE — Progress Notes (Signed)
Subjective:        Sherri [name] is a 53 y.o. female here for a routine exam.  Current complaints: Occasional spotting after intercourse.  Denies abnormal vaginal discharge or irritation.  Personal health questionnaire:  Is patient Ashkenazi Jewish, have a family history of breast and/or ovarian cancer: no Is there a family history of uterine cancer diagnosed at age < 47, gastrointestinal cancer, urinary tract cancer, family member who is a Personnel officer syndrome-associated carrier: no Is the patient overweight and hypertensive, family history of diabetes, personal history of gestational diabetes, preeclampsia or PCOS: no Is patient over 85, have PCOS,  family history of premature CHD under age 63, diabetes, smoke, have hypertension or peripheral artery disease:  no At any time, has a partner hit, kicked or otherwise hurt or frightened you?: no Over the past 2 weeks, have you felt down, depressed or hopeless?: no Over the past 2 weeks, have you felt little interest or pleasure in doing things?:no   Gynecologic History Patient's last menstrual period was 07/29/2012 (approximate). Contraception: post menopausal status Last Pap: 2015. Results were: normal Last mammogram: 2019. Results were: normal  Obstetric History OB History  Gravida Para Term Preterm AB Living  0 0 1  SAB TAB Ectopic Multiple Live Births               # Outcome Date GA Lbr Len/2nd Weight Sex Delivery Anes PTL Lv  1 Term             Past Medical History:  Diagnosis Date  . Angioedema   . Anxiety   . Arthritis   . Asthma   . CAP (community acquired pneumonia) 08/29/2012  . Chronic back pain   . Chronic kidney disease   . Cocaine abuse (HCC)   . Depression   . Diarrhea   . DVT (deep venous thrombosis) (HCC)   . Environmental allergies   . Fall   . GERD (gastroesophageal reflux disease)   . Heart murmur    has been told once that she has a heart murmur, but has never had any problems  . Hiatal hernia  03/21/2013  . Lumbar herniated disc   . Peripheral vascular disease (HCC)   . Pernicious anemia 10/30/2011  . Postconcussion syndrome 12/30/2014  . Seasonal allergies   . Stiff person syndrome   . Urticaria     Past Surgical History:  Procedure Laterality Date  . BUNIONECTOMY Left   . colonoscopy  07/09/15  . inguinal hernia 1983  1983  . OPEN REDUCTION INTERNAL FIXATION (ORIF) DISTAL RADIAL FRACTURE Right 08/16/2016   Procedure: OPEN REDUCTION INTERNAL FIXATION (ORIF) DISTAL RADIAL FRACTURE;  Surgeon: Eldred Manges, MD;  Location: [facility] OR;  Service: Orthopedics;  Laterality: Right;     Current Outpatient Medications:  .  albuterol (PROVENTIL HFA;VENTOLIN HFA) 108 (90 BASE) MCG/ACT inhaler, Inhale 1 puff into the lungs every 6 (six) hours as needed. PRO AIR, Disp: , Rfl:  .  Ascorbic Acid (VITAMIN C) 1000 MG tablet, Take 1,000 mg by mouth daily., Disp: , Rfl:  .  baclofen (LIORESAL) 20 MG tablet, TAKE ONE & ONE-HALF TABLETS BY MOUTH IN THE MORNING AND IN THE EVENING AND ONE AT  MIDDAY, Disp: 360 tablet, Rfl: 3 .  Cholecalciferol (VITAMIN D3) 5000 units CAPS, Take by mouth., Disp: , Rfl:  .  Cranberry-Vitamin C-Vitamin E (CRANBERRY PLUS VITAMIN C) 4200-20-3 MG-MG-UNIT CAPS, Take by mouth., Disp: , Rfl:  .  cyanocobalamin (,VITAMIN B-12,)  1000 MCG/ML injection, Inject 1,000 mcg into the muscle every 3 (three) months. , Disp: , Rfl:  .  desloratadine (CLARINEX) 5 MG tablet, Take 1 tablet (5 mg total) by mouth daily., Disp: 30 tablet, Rfl: 0 .  diazepam (VALIUM) 10 MG tablet, Take 2 tablets (20 mg total) by mouth 3 (three) times daily., Disp: 540 tablet, Rfl: 0 .  diphenhydrAMINE (BENADRYL) 25 MG tablet, Take 25 mg by mouth at bedtime as needed for itching or allergies. , Disp: , Rfl:  .  EPIPEN 2-PAK 0.3 MG/0.3ML SOAJ injection, USE AS DIRECTED FOR  SEVERE  ALLERGIC  REACTIONS, Disp: 1 Device, Rfl: 0 .  famotidine (PEPCID) 40 MG tablet, Take 75 mg by mouth daily., Disp: , Rfl:  .  FLUoxetine  (PROZAC) 10 MG capsule, TAKE 1 CAPSULE BY MOUTH  DAILY, Disp: 90 capsule, Rfl: 1 .  gabapentin (NEURONTIN) 300 MG capsule, TAKE 1 CAPSULE BY MOUTH TWO TIMES DAILY, Disp: 180 capsule, Rfl: 1 .  Glucose Blood (ACCU-CHEK SIMPLICITY TEST STRP VI), by In Vitro route. Insert 1 by subcutaneous route 4 times every day check blood sugar before breakfast, lunch and dinner and bedtime, Disp: , Rfl:  .  metFORMIN (GLUCOPHAGE) 500 MG tablet, TAKE 1 TABLET BY MOUTH TWICE A DAY WITH MORNING AND EVENING MEALS, Disp: 180 tablet, Rfl: 1 .  Olopatadine HCl (PAZEO) 0.7 % SOLN, Apply 1 drop to eye daily., Disp: , Rfl:  .  OVER THE COUNTER MEDICATION, I B Guard, Disp: , Rfl:  .  vitamin E (VITAMIN E) 400 UNIT capsule, Take 400 Units by mouth daily., Disp: , Rfl:  Allergies  Allergen Reactions  . Ibuprofen Other (See Comments)    Does not take due to hx of renal insufficiency "I have kidney disease"   . Lemon Flavor Swelling    Severe Lip Swelling  FRUIT per pt.    . Amoxicillin Diarrhea and Other (See Comments)  . Tylenol [Acetaminophen] Hives    Cannot take large quantities  . Amoxicillin Diarrhea    Social History   Tobacco Use  . Smoking status: Never Smoker  . Smokeless tobacco: Never Used  . Tobacco comment: Never Used Tobacco  Substance Use Topics  . Alcohol use: Yes    Alcohol/week: 0.0 standard drinks    Comment: Occasional    Family History  Problem Relation Age of Onset  . Cancer Mother   . Other Mother   . COPD Father   . Asthma Brother   . Cancer Brother        colon  . Heart attack Brother   . Seizures Brother       Review of Systems  Constitutional: negative for fatigue and weight loss Respiratory: negative for cough and wheezing Cardiovascular: negative for chest pain, fatigue and palpitations Gastrointestinal: negative for abdominal pain and change in bowel habits Musculoskeletal:negative for myalgias Neurological: negative for gait problems and tremors Behavioral/Psych:  negative for abusive relationship, depression Endocrine: negative for temperature intolerance    Genitourinary: positive for spotting after intercourse Integument/breast: negative for breast lump, breast tenderness, nipple discharge and skin lesion(s)    Objective:       BP 120/79   Pulse 71   Wt 109 lb 12.8 oz (49.8 kg)   LMP 07/29/2012 (Approximate)   BMI 19.45 kg/m            General:  Alert and no distress          Breasts:  Normal without masses, tenderness  Abdomen:  normal findings: no organomegaly, soft, non-tender and no hernia  Pelvis:  External genitalia: normal general appearance Urinary system: urethral meatus normal and bladder without fullness, nontender Vaginal: normal without tenderness, induration or masses Cervix: normal appearance Adnexa: normal bimanual exam Uterus: anteverted and non-tender, normal size   Lab Review Urine pregnancy test Labs reviewed yes Radiologic studies reviewed yes  50% of 15 min visit spent on counseling and coordination of care.   Assessment:     1. Encounter for routine gynecological examination with Papanicolaou smear of cervix Rx: - Cytology - PAP( [facility])  2. Postmenopausal postcoital bleeding - stable clinically, suspect atrophic vaginitis  3. Vaginal discharge Rx: - Cervicovaginal ancillary only( [facility])    Plan:    Education reviewed: calcium supplements, depression evaluation, low fat, low cholesterol diet, safe sex/STD prevention, self breast exams and weight bearing exercise. Follow up in: 1 year.  Or prn further spotting.  No orders of the defined types were placed in this encounter.  No orders of the defined types were placed in this encounter.   Brock Bad MD 02-27-2019

## 2019-03-03 LAB — CERVICOVAGINAL ANCILLARY ONLY
Bacterial vaginitis: POSITIVE — AB
Candida vaginitis: NEGATIVE
Chlamydia: NEGATIVE
Neisseria Gonorrhea: NEGATIVE
Trichomonas: NEGATIVE

## 2019-03-04 ENCOUNTER — Other Ambulatory Visit: Payer: Self-pay | Admitting: Obstetrics

## 2019-03-04 DIAGNOSIS — N760 Acute vaginitis: Secondary | ICD-10-CM

## 2019-03-04 DIAGNOSIS — B9689 Other specified bacterial agents as the cause of diseases classified elsewhere: Secondary | ICD-10-CM

## 2019-03-04 LAB — CYTOLOGY - PAP
Diagnosis: NEGATIVE
HPV: DETECTED — AB

## 2019-03-04 MED ORDER — METRONIDAZOLE 500 MG PO TABS: 500.0000 mg | ORAL_TABLET | Freq: Two times a day (BID) | ORAL | 2 refills | Status: DC

## 2019-03-04 NOTE — Progress Notes (Signed)
Pt made aware of result and rx sent to pharmacy, pt verbalizes understanding.

## 2019-03-05 ENCOUNTER — Telehealth: Payer: Self-pay | Admitting: Nurse Practitioner

## 2019-03-05 NOTE — Telephone Encounter (Signed)
Called to schedule Medicare Annual Wellness Visit with the Nurse Health Advisor. No answer.  Left message on home machine to call Misty Stanley at [phone].  If patient returns call, please note: their last AWV was on 04/182019, please schedule AWV with [facility] any date AFTER 04/13/2019 and please schedule an Office Visit same day if available.  Thank you! For any questions please contact: Trixie Rude at [phone] or Skype [email or web address]

## 2019-03-07 ENCOUNTER — Ambulatory Visit (INDEPENDENT_AMBULATORY_CARE_PROVIDER_SITE_OTHER): Payer: Medicare Other | Admitting: Nurse Practitioner

## 2019-03-07 ENCOUNTER — Other Ambulatory Visit: Payer: Self-pay | Admitting: Nurse Practitioner

## 2019-03-07 ENCOUNTER — Other Ambulatory Visit: Payer: Self-pay

## 2019-03-07 ENCOUNTER — Encounter: Payer: Self-pay | Admitting: Nurse Practitioner

## 2019-03-07 VITALS — BP 98/62 | HR 89 | Wt 111.2 lb

## 2019-03-07 DIAGNOSIS — Z889 Allergy status to unspecified drugs, medicaments and biological substances status: Secondary | ICD-10-CM

## 2019-03-07 DIAGNOSIS — R21 Rash and other nonspecific skin eruption: Secondary | ICD-10-CM | POA: Diagnosis not present

## 2019-03-07 MED ORDER — TRIAMCINOLONE ACETONIDE 40 MG/ML IJ SUSP
40.0000 mg | Freq: Once | INTRAMUSCULAR | Status: AC
  Administered 2019-03-07: 40 mg via INTRAMUSCULAR

## 2019-03-07 MED ORDER — EPINEPHRINE 0.3 MG/0.3ML IJ SOAJ: 0.3000 mg | INTRAMUSCULAR | 2 refills | Status: DC | PRN

## 2019-03-07 MED ORDER — HYDROXYZINE PAMOATE 100 MG PO CAPS: 100.0000 mg | ORAL_CAPSULE | Freq: Three times a day (TID) | ORAL | 2 refills | Status: DC | PRN

## 2019-03-07 NOTE — Progress Notes (Signed)
Subjective:     Patient ID: Sherri [name] , female    DOB: Oct 09, 1966 , 53 y.o.   MRN: [id number]   Chief Complaint  Patient presents with  . Urticaria    swelling in lips, hives, icthing, started 3 days ago     HPI  Urticaria  This is a new problem. The current episode started in the past 7 days (3-4 days). The affected locations include the right upper leg and left upper leg. The rash is characterized by itchiness. Pertinent negatives include no cough, fatigue or sore throat. Her past medical history is significant for allergies and asthma.     Past Medical History:  Diagnosis Date  . Angioedema   . Anxiety   . Arthritis   . Asthma   . CAP (community acquired pneumonia) 08/29/2012  . Chronic back pain   . Chronic kidney disease   . Cocaine abuse (HCC)   . Depression   . Diarrhea   . DVT (deep venous thrombosis) (HCC)   . Environmental allergies   . Fall   . GERD (gastroesophageal reflux disease)   . Heart murmur    has been told once that she has a heart murmur, but has never had any problems  . Hiatal hernia 03/21/2013  . Lumbar herniated disc   . Peripheral vascular disease (HCC)   . Pernicious anemia 10/30/2011  . Postconcussion syndrome 12/30/2014  . Seasonal allergies   . Stiff person syndrome   . Urticaria      Family History  Problem Relation Age of Onset  . Cancer Mother   . Other Mother   . COPD Father   . Asthma Brother   . Cancer Brother        colon  . Heart attack Brother   . Seizures Brother      Current Outpatient Medications:  .  albuterol (PROVENTIL HFA;VENTOLIN HFA) 108 (90 BASE) MCG/ACT inhaler, Inhale 1 puff into the lungs every 6 (six) hours as needed. PRO AIR, Disp: , Rfl:  .  Ascorbic Acid (VITAMIN C) 1000 MG tablet, Take 1,000 mg by mouth daily., Disp: , Rfl:  .  baclofen (LIORESAL) 20 MG tablet, TAKE ONE & ONE-HALF TABLETS BY MOUTH IN THE MORNING AND IN THE EVENING AND ONE AT  MIDDAY, Disp: 360 tablet, Rfl: 3 .  Cholecalciferol  (VITAMIN D3) 5000 units CAPS, Take by mouth., Disp: , Rfl:  .  Cranberry-Vitamin C-Vitamin E (CRANBERRY PLUS VITAMIN C) 4200-20-3 MG-MG-UNIT CAPS, Take by mouth., Disp: , Rfl:  .  cyanocobalamin (,VITAMIN B-12,) 1000 MCG/ML injection, Inject 1,000 mcg into the muscle every 3 (three) months. , Disp: , Rfl:  .  desloratadine (CLARINEX) 5 MG tablet, Take 1 tablet (5 mg total) by mouth daily., Disp: 30 tablet, Rfl: 0 .  diazepam (VALIUM) 10 MG tablet, Take 2 tablets (20 mg total) by mouth 3 (three) times daily., Disp: 540 tablet, Rfl: 0 .  EPIPEN 2-PAK 0.3 MG/0.3ML SOAJ injection, USE AS DIRECTED FOR  SEVERE  ALLERGIC  REACTIONS, Disp: 1 Device, Rfl: 0 .  famotidine (PEPCID) 40 MG tablet, Take 75 mg by mouth daily., Disp: , Rfl:  .  FLUoxetine (PROZAC) 10 MG capsule, TAKE 1 CAPSULE BY MOUTH  DAILY, Disp: 90 capsule, Rfl: 1 .  gabapentin (NEURONTIN) 300 MG capsule, TAKE 1 CAPSULE BY MOUTH TWO TIMES DAILY, Disp: 180 capsule, Rfl: 1 .  Glucose Blood (ACCU-CHEK SIMPLICITY TEST STRP VI), by In Vitro route. Insert 1 by subcutaneous route 4 times  every day check blood sugar before breakfast, lunch and dinner and bedtime, Disp: , Rfl:  .  metFORMIN (GLUCOPHAGE) 500 MG tablet, TAKE 1 TABLET BY MOUTH TWICE A DAY WITH MORNING AND EVENING MEALS, Disp: 180 tablet, Rfl: 1 .  metroNIDAZOLE (FLAGYL) 500 MG tablet, Take 1 tablet (500 mg total) by mouth 2 (two) times daily., Disp: 14 tablet, Rfl: 2 .  Olopatadine HCl (PAZEO) 0.7 % SOLN, Apply 1 drop to eye daily., Disp: , Rfl:  .  vitamin E (VITAMIN E) 400 UNIT capsule, Take 400 Units by mouth daily., Disp: , Rfl:  .  diphenhydrAMINE (BENADRYL) 25 MG tablet, Take 25 mg by mouth at bedtime as needed for itching or allergies. , Disp: , Rfl:    Allergies  Allergen Reactions  . Ibuprofen Other (See Comments)    Does not take due to hx of renal insufficiency "I have kidney disease"   . Lemon Flavor Swelling    Severe Lip Swelling  FRUIT per pt.    . Amoxicillin  Diarrhea and Other (See Comments)  . Tylenol [Acetaminophen] Hives    Cannot take large quantities  . Amoxicillin Diarrhea     Review of Systems  Constitutional: Negative for fatigue.  HENT: Negative for facial swelling and sore throat.   Respiratory: Negative for cough and wheezing.   Cardiovascular: Negative for chest pain, palpitations and leg swelling.  Endocrine: Negative for polydipsia, polyphagia and polyuria.  Skin: Positive for rash (bilateral thighs with hives).     Today's Vitals   03/07/19 1541  BP: 98/62  Pulse: 89  SpO2: 97%  Weight: 111 lb 3.2 oz (50.4 kg)   Body mass index is 19.7 kg/m.   Objective:  Physical Exam Constitutional:      Appearance: Normal appearance.  HENT:     Mouth/Throat:     Mouth: Mucous membranes are moist.     Comments: Bottom lip swollen Cardiovascular:     Rate and Rhythm: Normal rate and regular rhythm.     Pulses: Normal pulses.     Heart sounds: Normal heart sounds. No murmur.  Pulmonary:     Effort: Pulmonary effort is normal. No respiratory distress.     Breath sounds: Normal breath sounds. No wheezing.  Skin:    General: Skin is warm and dry.     Capillary Refill: Capillary refill takes less than 2 seconds.     Findings: Rash (bilateral thighs) present.     Comments: She has two areas of red "vesicles" to left arm  Neurological:     General: No focal deficit present.     Mental Status: She is alert and oriented to person, place, and time.  Psychiatric:        Mood and Affect: Mood normal.        Behavior: Behavior normal.        Thought Content: Thought content normal.        Judgment: Judgment normal.         Assessment And Plan:     1. Rash and nonspecific skin eruption  Advised if have difficulty breathing to go to the ER for further evaluation  She also has an epinephrine pen   - hydrOXYzine (VISTARIL) 100 MG capsule; Take 1 capsule (100 mg total) by mouth 3 (three) times daily as needed for itching.   Dispense: 30 capsule; Refill: 2 - Ambulatory referral to Allergy - triamcinolone acetonide (KENALOG-40) injection 40 mg   Arnette Felts, FNP

## 2019-03-07 NOTE — Telephone Encounter (Signed)
Returned call to patient to set up her [employer] Visit.  Patient scheduled AWV-s for 04/16/2019 at 11:30 AM.  While on the phone patient was telling me she is having an allergic reaction and that she does have an appointment to come into the office today March 07, 2019 at 3:30 PM.  She mentioned that her voice was hoarse, but denied any difficulty breathing or throat swelling.  I advised patient that if she has the difficulty breathing or throat swelling to call 911.  Patient voiced understood.  Patient told me she has an Epipen if needed.  Trixie Rude, Care Guide.

## 2019-04-03 ENCOUNTER — Telehealth: Payer: Self-pay | Admitting: Neurology

## 2019-04-03 NOTE — Telephone Encounter (Signed)
Noted. Will contact pt closer to her appt date to complete pre charting.

## 2019-04-03 NOTE — Telephone Encounter (Signed)
Pt called and would like to consent to a VV and for her insurance to be billed as such. I have confirmed the e-mail.

## 2019-04-08 NOTE — Telephone Encounter (Signed)
I contacted the pt and left a vm advising, due to current COVID 19 pandemic, our office is severely reducing in office visits for at least the next 2 weeks, in order to minimize the risk to our patients and healthcare providers.   Pt is scheduled for 04/18/19 and needs to be moved to another date due to our office being closed until further notice on Fridays. Pt requested to call back.  If pt calls back please offer an appt on 4/15 or 4/16.

## 2019-04-09 NOTE — Telephone Encounter (Signed)
Lvm again to discus VV.

## 2019-04-10 NOTE — Telephone Encounter (Signed)
Requested a call back to discuss moving appt and vv.

## 2019-04-14 NOTE — Telephone Encounter (Signed)
Sade. Can you please call this pt back and move her to another date? She is scheduled for 04/18/19 which is Friday and we are not doing virtual video visit on fridays right now. Please see other message stating this. Thanks!

## 2019-04-14 NOTE — Addendum Note (Signed)
Addended by: Ann Maki T on: 04/14/2019 02:49 PM   Modules accepted: Orders

## 2019-04-14 NOTE — Telephone Encounter (Signed)
Pt returned call for download for VV , unable to assist with downloading because the patient called from the device she will be do VV from, pt was able to write down directions for [employer] Ex  Email- [email or web address]

## 2019-04-14 NOTE — Telephone Encounter (Signed)
I contacted the pt and was able to schedule a virtual video visit for 04/15/19 at 3:30 pm.   Pt understands that although there may be some limitations with this type of visit, we will take all precautions to reduce any security or privacy concerns.  Pt understands that this will be treated like an in office visit and we will file with pt's insurance, and there may be a patient responsible charge related to this service.  Pt's email is [email or web address]. Pt understands that the [employer] webex software must be downloaded and operational on the device pt plans to use for the visit.  EMR has been updated.

## 2019-04-15 ENCOUNTER — Encounter: Payer: Self-pay | Admitting: Neurology

## 2019-04-15 ENCOUNTER — Other Ambulatory Visit: Payer: Self-pay

## 2019-04-15 ENCOUNTER — Ambulatory Visit (INDEPENDENT_AMBULATORY_CARE_PROVIDER_SITE_OTHER): Payer: Medicare Other | Admitting: Neurology

## 2019-04-15 DIAGNOSIS — G2582 Stiff-man syndrome: Secondary | ICD-10-CM | POA: Diagnosis not present

## 2019-04-15 NOTE — Progress Notes (Signed)
     Virtual Visit via Video Note  I connected with Sherri [name] on 04/15/19 at  3:30 PM EDT by a video enabled telemedicine application and verified that I am speaking with the correct person using two identifiers.   I discussed the limitations of evaluation and management by telemedicine and the availability of in person appointments. The patient expressed understanding and agreed to proceed.  Patient is at home, physician in office  History of Present Illness: Sherri [name] is a 53 year old right-handed black female with a history of stiff person syndrome.  The patient is on baclofen and diazepam, she is having some drowsiness on the medication but not severely.  She has done fairly well, she is able to exercise on a regular basis.  She sometimes will use a cane for ambulation.  She has borderline diabetes, her last hemoglobin A1c was 5.8.  The patient has not had any falls since last seen.  She does have some slight gait instability at times.  She claims that she requires a handicap placard for her car.  The patient has not noted any significant new medical problems since last seen.  She is not currently working.  She sleeps fairly well in the evening hours.   Observations/Objective: The WebEx evaluation reveals that the patient is alert and cooperative.  The patient has good speech pattern, no aphasia or dysarthria is noted.  Extraocular movements are full.  The patient is able to protrude the tongue in the midline, good lateral movement of the tongue is noted.  The patient has good finger-nose-finger and heel shin bilaterally.  Range of movement of the cervical spine is full.  The patient is able to walk independently.  Tandem gait is unremarkable.  Romberg is negative.  Assessment and Plan: 1.  Stiff person syndrome  The patient is doing well on her current medications.  She will continue the diazepam and the baclofen.  She will follow-up to this office in 6 months.  Follow Up  Instructions: 15-month follow-up, may see nurse practitioner.   I discussed the assessment and treatment plan with the patient. The patient was provided an opportunity to ask questions and all were answered. The patient agreed with the plan and demonstrated an understanding of the instructions.   The patient was advised to call back or seek an in-person evaluation if the symptoms worsen or if the condition fails to improve as anticipated.  I provided 15 minutes of non-face-to-face time during this encounter.   York Spaniel, MD

## 2019-04-16 ENCOUNTER — Telehealth: Payer: Self-pay

## 2019-04-16 ENCOUNTER — Ambulatory Visit: Payer: Medicare Other | Admitting: Nurse Practitioner

## 2019-04-16 ENCOUNTER — Ambulatory Visit: Payer: Medicare Other

## 2019-04-16 NOTE — Telephone Encounter (Signed)
Patient called stating she needs her forms for her handicap sticker to be filled out but she wants to know if she has to pay $20 for it to be filled out.  I LEFT PT V/M AND ADVISED HER THE FORM CAN BE FILLED OUT AT HER OV SHE JUST NEEDS TO MAKE SURE THE PART IS ALL FILLED OUT. Adolph Pollack

## 2019-04-18 ENCOUNTER — Ambulatory Visit: Payer: Medicare Other | Admitting: Neurology

## 2019-04-22 ENCOUNTER — Ambulatory Visit (INDEPENDENT_AMBULATORY_CARE_PROVIDER_SITE_OTHER): Payer: Medicare Other | Admitting: Nurse Practitioner

## 2019-04-22 ENCOUNTER — Other Ambulatory Visit: Payer: Self-pay

## 2019-04-22 ENCOUNTER — Ambulatory Visit (INDEPENDENT_AMBULATORY_CARE_PROVIDER_SITE_OTHER): Payer: Medicare Other

## 2019-04-22 ENCOUNTER — Encounter: Payer: Self-pay | Admitting: Nurse Practitioner

## 2019-04-22 VITALS — Ht 63.0 in | Wt 107.9 lb

## 2019-04-22 DIAGNOSIS — F329 Major depressive disorder, single episode, unspecified: Secondary | ICD-10-CM

## 2019-04-22 DIAGNOSIS — E118 Type 2 diabetes mellitus with unspecified complications: Secondary | ICD-10-CM | POA: Diagnosis not present

## 2019-04-22 DIAGNOSIS — G2582 Stiff-man syndrome: Secondary | ICD-10-CM

## 2019-04-22 DIAGNOSIS — F32A Depression, unspecified: Secondary | ICD-10-CM

## 2019-04-22 DIAGNOSIS — Z0000 Encounter for general adult medical examination without abnormal findings: Secondary | ICD-10-CM | POA: Diagnosis not present

## 2019-04-22 DIAGNOSIS — R21 Rash and other nonspecific skin eruption: Secondary | ICD-10-CM | POA: Diagnosis not present

## 2019-04-22 NOTE — Patient Instructions (Signed)
Sherri [name] , Thank you for taking time to come for your Medicare Wellness Visit. I appreciate your ongoing commitment to your health goals. Please review the following plan we discussed and let me know if I can assist you in the future.   Screening recommendations/referrals: Colonoscopy: 09/2014 Mammogram: 08/2018 Bone Density: 04/2013 Recommended yearly ophthalmology/optometry visit for glaucoma screening and checkup Recommended yearly dental visit for hygiene and checkup  Vaccinations: Influenza vaccine: declines Pneumococcal vaccine: declines Tdap vaccine: 04/2016 Shingles vaccine: discussed    Advanced directives: Advance directive discussed with you today.  Conditions/risks identified: diabetic  Next appointment: 04/22/2020 at 11:00  Preventive Care 40-64 Years, Female Preventive care refers to lifestyle choices and visits with your health care provider that can promote health and wellness. What does preventive care include?  A yearly physical exam. This is also called an annual well check.  Dental exams once or twice a year.  Routine eye exams. Ask your health care provider how often you should have your eyes checked.  Personal lifestyle choices, including:  Daily care of your teeth and gums.  Regular physical activity.  Eating a healthy diet.  Avoiding tobacco and drug use.  Limiting alcohol use.  Practicing safe sex.  Taking low-dose aspirin daily starting at age 44.  Taking vitamin and mineral supplements as recommended by your health care provider. What happens during an annual well check? The services and screenings done by your health care provider during your annual well check will depend on your age, overall health, lifestyle risk factors, and family history of disease. Counseling  Your health care provider may ask you questions about your:  Alcohol use.  Tobacco use.  Drug use.  Emotional well-being.  Home and relationship well-being.  Sexual  activity.  Eating habits.  Work and work Statistician.  Method of birth control.  Menstrual cycle.  Pregnancy history. Screening  You may have the following tests or measurements:  Height, weight, and BMI.  Blood pressure.  Lipid and cholesterol levels. These may be checked every 5 years, or more frequently if you are over 75 years old.  Skin check.  Lung cancer screening. You may have this screening every year starting at age 80 if you have a 30-pack-year history of smoking and currently smoke or have quit within the past 15 years.  Fecal occult blood test (FOBT) of the stool. You may have this test every year starting at age 29.  Flexible sigmoidoscopy or colonoscopy. You may have a sigmoidoscopy every 5 years or a colonoscopy every 10 years starting at age 55.  Hepatitis C blood test.  Hepatitis B blood test.  Sexually transmitted disease (STD) testing.  Diabetes screening. This is done by checking your blood sugar (glucose) after you have not eaten for a while (fasting). You may have this done every 1-3 years.  Mammogram. This may be done every 1-2 years. Talk to your health care provider about when you should start having regular mammograms. This may depend on whether you have a family history of breast cancer.  BRCA-related cancer screening. This may be done if you have a family history of breast, ovarian, tubal, or peritoneal cancers.  Pelvic exam and Pap test. This may be done every 3 years starting at age 53. Starting at age 18, this may be done every 5 years if you have a Pap test in combination with an HPV test.  Bone density scan. This is done to screen for osteoporosis. You may have this scan if  you are at high risk for osteoporosis. Discuss your test results, treatment options, and if necessary, the need for more tests with your health care provider. Vaccines  Your health care provider may recommend certain vaccines, such as:  Influenza vaccine. This is  recommended every year.  Tetanus, diphtheria, and acellular pertussis (Tdap, Td) vaccine. You may need a Td booster every 10 years.  Zoster vaccine. You may need this after age 46.  Pneumococcal 13-valent conjugate (PCV13) vaccine. You may need this if you have certain conditions and were not previously vaccinated.  Pneumococcal polysaccharide (PPSV23) vaccine. You may need one or two doses if you smoke cigarettes or if you have certain conditions. Talk to your health care provider about which screenings and vaccines you need and how often you need them. This information is not intended to replace advice given to you by your health care provider. Make sure you discuss any questions you have with your health care provider. Document Released: 01/07/2016 Document Revised: 08/30/2016 Document Reviewed: 10/12/2015 Elsevier Interactive Patient Education  2017 [place] Prevention in the Home Falls can cause injuries. They can happen to people of all ages. There are many things you can do to make your home safe and to help prevent falls. What can I do on the outside of my home?  Regularly fix the edges of walkways and driveways and fix any cracks.  Remove anything that might make you trip as you walk through a door, such as a raised step or threshold.  Trim any bushes or trees on the path to your home.  Use bright outdoor lighting.  Clear any walking paths of anything that might make someone trip, such as rocks or tools.  Regularly check to see if handrails are loose or broken. Make sure that both sides of any steps have handrails.  Any raised decks and porches should have guardrails on the edges.  Have any leaves, snow, or ice cleared regularly.  Use sand or salt on walking paths during winter.  Clean up any spills in your garage right away. This includes oil or grease spills. What can I do in the bathroom?  Use night lights.  Install grab bars by the toilet and in  the tub and shower. Do not use towel bars as grab bars.  Use non-skid mats or decals in the tub or shower.  If you need to sit down in the shower, use a plastic, non-slip stool.  Keep the floor dry. Clean up any water that spills on the floor as soon as it happens.  Remove soap buildup in the tub or shower regularly.  Attach bath mats securely with double-sided non-slip rug tape.  Do not have throw rugs and other things on the floor that can make you trip. What can I do in the bedroom?  Use night lights.  Make sure that you have a light by your bed that is easy to reach.  Do not use any sheets or blankets that are too big for your bed. They should not hang down onto the floor.  Have a firm chair that has side arms. You can use this for support while you get dressed.  Do not have throw rugs and other things on the floor that can make you trip. What can I do in the kitchen?  Clean up any spills right away.  Avoid walking on wet floors.  Keep items that you use a lot in easy-to-reach places.  If you  need to reach something above you, use a strong step stool that has a grab bar.  Keep electrical cords out of the way.  Do not use floor polish or wax that makes floors slippery. If you must use wax, use non-skid floor wax.  Do not have throw rugs and other things on the floor that can make you trip. What can I do with my stairs?  Do not leave any items on the stairs.  Make sure that there are handrails on both sides of the stairs and use them. Fix handrails that are broken or loose. Make sure that handrails are as long as the stairways.  Check any carpeting to make sure that it is firmly attached to the stairs. Fix any carpet that is loose or worn.  Avoid having throw rugs at the top or bottom of the stairs. If you do have throw rugs, attach them to the floor with carpet tape.  Make sure that you have a light switch at the top of the stairs and the bottom of the stairs. If  you do not have them, ask someone to add them for you. What else can I do to help prevent falls?  Wear shoes that:  Do not have high heels.  Have rubber bottoms.  Are comfortable and fit you well.  Are closed at the toe. Do not wear sandals.  If you use a stepladder:  Make sure that it is fully opened. Do not climb a closed stepladder.  Make sure that both sides of the stepladder are locked into place.  Ask someone to hold it for you, if possible.  Clearly mark and make sure that you can see:  Any grab bars or handrails.  First and last steps.  Where the edge of each step is.  Use tools that help you move around (mobility aids) if they are needed. These include:  Canes.  Walkers.  Scooters.  Crutches.  Turn on the lights when you go into a dark area. Replace any light bulbs as soon as they burn out.  Set up your furniture so you have a clear path. Avoid moving your furniture around.  If any of your floors are uneven, fix them.  If there are any pets around you, be aware of where they are.  Review your medicines with your doctor. Some medicines can make you feel dizzy. This can increase your chance of falling. Ask your doctor what other things that you can do to help prevent falls. This information is not intended to replace advice given to you by your health care provider. Make sure you discuss any questions you have with your health care provider. Document Released: 10/07/2009 Document Revised: 05/18/2016 Document Reviewed: 01/15/2015 Elsevier Interactive Patient Education  2017 [employer].

## 2019-04-22 NOTE — Progress Notes (Signed)
Subjective:   Sherri [name] is a [age] y.o. female who presents for Medicare Annual (Subsequent) preventive examination.  This visit type was conducted due to national recommendations for restrictions regarding the COVID-19 Pandemic (e.g. social distancing). This format is felt to be most appropriate for this patient at this time. All issues noted in this document were discussed and addressed. No physical exam was performed (except for noted visual exam findings with Video Visits). The patient, Sherri [name], has given consent to perform this visit via video. Vital signs may be absent or patient reported.   Patient location: at home   Nurse location:  [facility] Office   Review of Systems:  n/a Cardiac Risk Factors include: diabetes mellitus     Objective:     Vitals: Ht 5\' 3"  (1.6 m)   Wt 107 lb 14.4 oz (48.9 kg)   LMP 07/29/2012 (Approximate)   BMI 19.11 kg/m   Body mass index is 19.11 kg/m.  Advanced Directives 04/22/2019 02/25/2019 11/04/2018 03/15/2018 01/21/2018 11/23/2017 09/06/2017  Does Patient Have a Medical Advance Directive? No No No No No No No  Would patient like information on creating a medical advance directive? - No - Patient declined - - No - Patient declined - No - Patient declined  Pre-existing out of facility DNR order (yellow form or pink MOST form) - - - - - - -    Tobacco Social History   Tobacco Use  Smoking Status Never Smoker  Smokeless Tobacco Never Used  Tobacco Comment   Never Used Tobacco     Counseling given: Not Answered Comment: Never Used Tobacco   Clinical Intake:  Pre-visit preparation completed: Yes  Pain : No/denies pain Pain Score: 0-No pain     Nutritional Status: BMI of 19-24  Normal Nutritional Risks: Nausea/ vomitting/ diarrhea(on occasion ) Diabetes: Yes CBG done?: No Did pt. bring in CBG monitor from home?: No  How often do you need to have someone help you when you read instructions, pamphlets, or other written  materials from your doctor or pharmacy?: 1 - Never What is the last grade level you completed in school?: some college  Interpreter Needed?: No  Information entered by :: NAllen LPN  Past Medical History:  Diagnosis Date  . Angioedema   . Anxiety   . Arthritis   . Asthma   . CAP (community acquired pneumonia) 08/29/2012  . Chronic back pain   . Chronic kidney disease   . Cocaine abuse (HCC)   . Depression   . Diarrhea   . DVT (deep venous thrombosis) (HCC)   . Environmental allergies   . Fall   . GERD (gastroesophageal reflux disease)   . Heart murmur    has been told once that she has a heart murmur, but has never had any problems  . Hiatal hernia 03/21/2013  . Lumbar herniated disc   . Peripheral vascular disease (HCC)   . Pernicious anemia 10/30/2011  . Postconcussion syndrome 12/30/2014  . Seasonal allergies   . Stiff person syndrome   . Urticaria    Past Surgical History:  Procedure Laterality Date  . BUNIONECTOMY Left   . colonoscopy  07/09/15  . inguinal hernia 1983  1983  . OPEN REDUCTION INTERNAL FIXATION (ORIF) DISTAL RADIAL FRACTURE Right 08/16/2016   Procedure: OPEN REDUCTION INTERNAL FIXATION (ORIF) DISTAL RADIAL FRACTURE;  Surgeon: Eldred MangesMark C Yates, MD;  Location: [facility] OR;  Service: Orthopedics;  Laterality: Right;   Family History  Problem Relation Age of  Onset  . Cancer Mother   . Other Mother   . COPD Father   . Asthma Brother   . Cancer Brother        colon  . Heart attack Brother   . Seizures Brother    Social History   Socioeconomic History  . Marital status: Divorced    Spouse name: Not on file  . Number of children: 1  . Years of education: college  . Highest education level: Not on file  Occupational History  . Occupation: disabled  Social Needs  . Financial resource strain: Not hard at all  . Food insecurity:    Worry: Never true    Inability: Never true  . Transportation needs:    Medical: No    Non-medical: No  Tobacco Use  .  Smoking status: Never Smoker  . Smokeless tobacco: Never Used  . Tobacco comment: Never Used Tobacco  Substance and Sexual Activity  . Alcohol use: Yes    Alcohol/week: 0.0 standard drinks    Comment: Occasional  . Drug use: Not Currently  . Sexual activity: Not Currently    Birth control/protection: Condom  Lifestyle  . Physical activity:    Days per week: 2 days    Minutes per session: 30 min  . Stress: Not at all  Relationships  . Social connections:    Talks on phone: Not on file    Gets together: Not on file    Attends religious service: Not on file    Active member of club or organization: Not on file    Attends meetings of clubs or organizations: Not on file    Relationship status: Not on file  Other Topics Concern  . Not on file  Social History Narrative   Patient is right and left handed.   Patient drinks some caffeine occasionally.    Outpatient Encounter Medications as of 04/22/2019  Medication Sig  . albuterol (PROVENTIL HFA;VENTOLIN HFA) 108 (90 BASE) MCG/ACT inhaler Inhale 1 puff into the lungs every 6 (six) hours as needed. PRO AIR  . Ascorbic Acid (VITAMIN C) 1000 MG tablet Take 1,000 mg by mouth daily.  . baclofen (LIORESAL) 20 MG tablet TAKE ONE & ONE-HALF TABLETS BY MOUTH IN THE MORNING AND IN THE EVENING AND ONE AT  MIDDAY  . cetirizine (ZYRTEC) 10 MG tablet Take 10 mg by mouth daily.  . Cholecalciferol (VITAMIN D3) 5000 units CAPS Take by mouth.  . Cranberry-Vitamin C-Vitamin E (CRANBERRY PLUS VITAMIN C) 4200-20-3 MG-MG-UNIT CAPS Take by mouth.  . cyanocobalamin (,VITAMIN B-12,) 1000 MCG/ML injection Inject 1,000 mcg into the muscle every 3 (three) months.   . desloratadine (CLARINEX) 5 MG tablet Take 1 tablet (5 mg total) by mouth daily.  . diazepam (VALIUM) 10 MG tablet Take 2 tablets (20 mg total) by mouth 3 (three) times daily.  . diphenhydrAMINE (BENADRYL) 25 MG tablet Take 25 mg by mouth at bedtime as needed for itching or allergies.   . famotidine  (PEPCID) 40 MG tablet Take 75 mg by mouth daily.  [name] FLUoxetine (PROZAC) 10 MG capsule TAKE 1 CAPSULE BY MOUTH  DAILY  . gabapentin (NEURONTIN) 300 MG capsule TAKE 1 CAPSULE BY MOUTH TWO TIMES DAILY  . Glucose Blood (ACCU-CHEK SIMPLICITY TEST STRP VI) by In Vitro route. Insert 1 by subcutaneous route 4 times every day check blood sugar before breakfast, lunch and dinner and bedtime  . hydrOXYzine (VISTARIL) 100 MG capsule Take 1 capsule (100 mg total) by mouth 3 (  three) times daily as needed for itching.  . metFORMIN (GLUCOPHAGE) 500 MG tablet TAKE 1 TABLET BY MOUTH TWICE A DAY WITH MORNING AND EVENING MEALS  . Olopatadine HCl (PAZEO) 0.7 % SOLN Apply 1 drop to eye daily.  . vitamin E (VITAMIN E) 400 UNIT capsule Take 400 Units by mouth daily.   No facility-administered encounter medications on file as of 04/22/2019.     Activities of Daily Living In your present state of health, do you have any difficulty performing the following activities: 04/22/2019  Hearing? N  Vision? N  Difficulty concentrating or making decisions? N  Walking or climbing stairs? N  Dressing or bathing? N  Doing errands, shopping? N  Preparing Food and eating ? N  Using the Toilet? N  In the past six months, have you accidently leaked urine? N  Do you have problems with loss of bowel control? N  Managing your Medications? N  Managing your Finances? N  Housekeeping or managing your Housekeeping? N  Some recent data might be hidden    Patient Care Team: Arnette Felts, FNP as PCP - General (General Practice)    Assessment:   This is a routine wellness examination for [facility].  Exercise Activities and Dietary recommendations Current Exercise Habits: Home exercise routine, Type of exercise: strength training/weights;treadmill(leg lifts and squats), Time (Minutes): 30, Frequency (Times/Week): 2, Weekly Exercise (Minutes/Week): 60, Intensity: Moderate  Goals    . Patient Stated     To get back in shape. Gain  muscle tone.       Fall Risk Fall Risk  04/22/2019 04/22/2019 01/21/2018 03/05/2017 02/05/2017  Falls in the past year? 1 1 Yes (No Data) (No Data)  Comment - - uses cane ~2 yrs for balance Denies new falls Patient took a "tumble" and fell this morning  Number falls in past yr: - -  Comment lost balance - - - -  Injury with Fall? 1 1 Yes - -  Comment laceration above eye - bumped head, not medical care - -  Risk Factor Category  - - - - -  Risk for fall due to : History of fall(s);Medication side effect - - - History of fall(s);Medication side effect  Risk for fall due to: Comment - - - - -  Follow up Falls prevention discussed - - - Education provided;Falls prevention discussed   Is the patient's home free of loose throw rugs in walkways, pet beds, electrical cords, etc?   yes      Grab bars in the bathroom? yes      Handrails on the stairs?   n/a      Adequate lighting?   yes  Timed Get Up and Go performed: n/a  Depression Screen PHQ 2/9 Scores 04/22/2019 04/22/2019 01/21/2018 01/09/2017  PHQ - 2 Score 0 0 0 0  PHQ- 9 Score 0 - - -     Cognitive Function     6CIT Screen 04/22/2019  What Year? 0 points  What month? 0 points  What time? 0 points  Count back from 20 0 points  Months in reverse 0 points  Repeat phrase 0 points  Total Score 0    Immunization History  Administered Date(s) Administered  . Tdap 04/25/2016    Qualifies for Shingles Vaccine? no  Screening Tests Health Maintenance  Topic Date Due  . FOOT EXAM  09/03/1974  . OPHTHALMOLOGY EXAM  09/03/1974  . URINE MICROALBUMIN  09/03/1974  . HIV Screening  09/04/1979  .  HEMOGLOBIN A1C  03/06/2019  . INFLUENZA VACCINE  03/30/2020 (Originally 07/26/2019)  . PNEUMOCOCCAL POLYSACCHARIDE VACCINE AGE 72-64 HIGH RISK  04/21/2020 (Originally 09/03/1966)  . MAMMOGRAM  09/10/2019  . PAP SMEAR-Modifier  02/26/2022  . COLONOSCOPY  10/23/2024  . TETANUS/TDAP  04/25/2026  . Hepatitis C Screening  Completed     Cancer Screenings: Lung: Low Dose CT Chest recommended if Age 5-80 years, 30 pack-year currently smoking OR have quit w/in 15years. Patient does not qualify. Breast:  Up to date on Mammogram? Yes   Up to date of Bone Density/Dexa? Yes Colorectal: up to date  Additional Screenings: Hepatitis C Screening: 10/05/2017     Plan:   Wants to get back in shape and gain muscle tone.  I have personally reviewed and noted the following in the patient's chart:   . Medical and social history . Use of alcohol, tobacco or illicit drugs  . Current medications and supplements . Functional ability and status . Nutritional status . Physical activity . Advanced directives . List of other physicians . Hospitalizations, surgeries, and ER visits in previous 12 months . Vitals . Screenings to include cognitive, depression, and falls . Referrals and appointments  In addition, I have reviewed and discussed with patient certain preventive protocols, quality metrics, and best practice recommendations. A written personalized care plan for preventive services as well as general preventive health recommendations were provided to patient.     Barb Merino, LPN  [phone]

## 2019-04-22 NOTE — Progress Notes (Signed)
Virtual Visit via Video ([email or web address])     This visit type was conducted due to national recommendations for restrictions regarding the COVID-19 Pandemic (e.g. social distancing) in an effort to limit this patient's exposure and mitigate transmission in our community.  Patients identity confirmed using two different identifiers.  This format is felt to be most appropriate for this patient at this time.  All issues noted in this document were discussed and addressed.  No physical exam was performed (except for noted visual exam findings with Video Visits).    Date:  04/22/2019   ID:  Sherri [name], DOB [birth year], MRN [id number]  Patient Location:  Home - [place]  Provider location:   Office    Chief Complaint:   History of Present Illness:    Sherri [name] is a 53 y.o. female who presents via video conferencing for a telehealth visit today.    The patient does not have symptoms concerning for COVID-19 infection (fever, chills, cough, or new shortness of breath).   She woke up this morning at 2am she ate an oatmeal cookie blood sugar this morning was 97  Diabetes  She presents for her follow-up diabetic visit. She has type 2 diabetes mellitus. Her disease course has been improving. There are no hypoglycemic associated symptoms. Pertinent negatives for hypoglycemia include no dizziness. There are no diabetic associated symptoms. Pertinent negatives for diabetes include no blurred vision, no chest pain, no polydipsia, no polyphagia and no polyuria. There are no hypoglycemic complications. There are no known risk factors for coronary artery disease. Current diabetic treatment includes oral agent (monotherapy).     Past Medical History:  Diagnosis Date  . Angioedema   . Anxiety   . Arthritis   . Asthma   . CAP (community acquired pneumonia) 08/29/2012  . Chronic back pain   . Chronic kidney disease   . Cocaine abuse (HCC)   . Depression   . Diarrhea   . DVT (deep venous  thrombosis) (HCC)   . Environmental allergies   . Fall   . GERD (gastroesophageal reflux disease)   . Heart murmur    has been told once that she has a heart murmur, but has never had any problems  . Hiatal hernia 03/21/2013  . Lumbar herniated disc   . Peripheral vascular disease (HCC)   . Pernicious anemia 10/30/2011  . Postconcussion syndrome 12/30/2014  . Seasonal allergies   . Stiff person syndrome   . Urticaria    Past Surgical History:  Procedure Laterality Date  . BUNIONECTOMY Left   . colonoscopy  07/09/15  . inguinal hernia 1983  1983  . OPEN REDUCTION INTERNAL FIXATION (ORIF) DISTAL RADIAL FRACTURE Right 08/16/2016   Procedure: OPEN REDUCTION INTERNAL FIXATION (ORIF) DISTAL RADIAL FRACTURE;  Surgeon: Eldred MangesMark C Yates, MD;  Location: [facility] OR;  Service: Orthopedics;  Laterality: Right;     Current Meds  Medication Sig  . albuterol (PROVENTIL HFA;VENTOLIN HFA) 108 (90 BASE) MCG/ACT inhaler Inhale 1 puff into the lungs every 6 (six) hours as needed. PRO AIR  . baclofen (LIORESAL) 20 MG tablet TAKE ONE & ONE-HALF TABLETS BY MOUTH IN THE MORNING AND IN THE EVENING AND ONE AT  MIDDAY  . Cholecalciferol (VITAMIN D3) 5000 units CAPS Take by mouth.  . Cranberry-Vitamin C-Vitamin E (CRANBERRY PLUS VITAMIN C) 4200-20-3 MG-MG-UNIT CAPS Take by mouth.  . cyanocobalamin (,VITAMIN B-12,) 1000 MCG/ML injection Inject 1,000 mcg into the muscle every 3 (three) months.   . desloratadine (CLARINEX) 5 MG  tablet Take 1 tablet (5 mg total) by mouth daily.  . diazepam (VALIUM) 10 MG tablet Take 2 tablets (20 mg total) by mouth 3 (three) times daily.  . diphenhydrAMINE (BENADRYL) 25 MG tablet Take 25 mg by mouth at bedtime as needed for itching or allergies.   . famotidine (PEPCID) 40 MG tablet Take 75 mg by mouth daily.  [name] FLUoxetine (PROZAC) 10 MG capsule TAKE 1 CAPSULE BY MOUTH  DAILY  . gabapentin (NEURONTIN) 300 MG capsule TAKE 1 CAPSULE BY MOUTH TWO TIMES DAILY  . Glucose Blood (ACCU-CHEK SIMPLICITY  TEST STRP VI) by In Vitro route. Insert 1 by subcutaneous route 4 times every day check blood sugar before breakfast, lunch and dinner and bedtime  . hydrOXYzine (VISTARIL) 100 MG capsule Take 1 capsule (100 mg total) by mouth 3 (three) times daily as needed for itching.  . metFORMIN (GLUCOPHAGE) 500 MG tablet TAKE 1 TABLET BY MOUTH TWICE A DAY WITH MORNING AND EVENING MEALS  . Olopatadine HCl (PAZEO) 0.7 % SOLN Apply 1 drop to eye daily.  . vitamin E (VITAMIN E) 400 UNIT capsule Take 400 Units by mouth daily.     Allergies:   Ibuprofen; Lemon flavor; Amoxicillin; Tylenol [acetaminophen]; and Amoxicillin   Social History   Tobacco Use  . Smoking status: Never Smoker  . Smokeless tobacco: Never Used  . Tobacco comment: Never Used Tobacco  Substance Use Topics  . Alcohol use: Yes    Alcohol/week: 0.0 standard drinks    Comment: Occasional  . Drug use: Not Currently     Family Hx: The patient's family history includes Asthma in her brother; COPD in her father; Cancer in her brother and mother; Heart attack in her brother; Other in her mother; Seizures in her brother.  ROS:   Please see the history of present illness.    Review of Systems  Constitutional: Negative.   Eyes: Negative for blurred vision.  Respiratory: Negative.   Cardiovascular: Negative.  Negative for chest pain.  Neurological: Negative for dizziness and tingling.  Endo/Heme/Allergies: Negative for polydipsia and polyphagia.  Psychiatric/Behavioral: Negative for depression.    All other systems reviewed and are negative.   Labs/Other Tests and Data Reviewed:    Recent Labs: 02/25/2019: ALT 7; BUN 16; Creatinine 1.02; Hemoglobin 11.2; Platelet Count 252; Potassium 4.4; Sodium 142   Recent Lipid Panel No results found for: CHOL, TRIG, HDL, CHOLHDL, LDLCALC, LDLDIRECT  Wt Readings from Last 3 Encounters:  04/22/19 107 lb 14.4 oz (48.9 kg)  03/07/19 111 lb 3.2 oz (50.4 kg)  02/27/19 109 lb 12.8 oz (49.8 kg)      Exam:    Vital Signs:  LMP 07/29/2012 (Approximate)     Physical Exam  ASSESSMENT & PLAN:    1. Controlled diabetes mellitus type 2 with complications, unspecified whether long term insulin use (HCC)  Chronic  Blood sugars are well controlled  Will check HgbA1c  Discussed having consistent HgbA1c's less than 5.6 before trialing off medications - Hemoglobin A1c; Future  2. Stiff person syndrome  Dr. Anne Hahn is decreasing the dose of gabapentin she is taking 1 in am and 1 noon and 2 at night.   3. Depression, unspecified depression type  Chronic,  Currently controlled.   4. Rash and nonspecific skin eruption  She has an appt with the allergist this Thursday   COVID-19 Education: The signs and symptoms of COVID-19 were discussed with the patient and how to seek care for testing (follow up with PCP  or arrange E-visit).  The importance of social distancing was discussed today.  Patient Risk:   After full review of this patients clinical status, I feel that they are at least moderate risk at this time.  Time:   Today, I have spent 15 minutes/ seconds with the patient with telehealth technology discussing above diagnoses.     Medication Adjustments/Labs and Tests Ordered: Current medicines are reviewed at length with the patient today.  Concerns regarding medicines are outlined above.   Tests Ordered: Orders Placed This Encounter  Procedures  . Hemoglobin A1c    Medication Changes: No orders of the defined types were placed in this encounter.   Disposition:  Follow up labs tomorrow at 10am and 3 month follow up diabetes  Signed, Arnette Felts, FNP

## 2019-04-23 ENCOUNTER — Other Ambulatory Visit: Payer: Self-pay

## 2019-04-23 ENCOUNTER — Other Ambulatory Visit: Payer: Medicare Other

## 2019-04-23 DIAGNOSIS — E118 Type 2 diabetes mellitus with unspecified complications: Secondary | ICD-10-CM

## 2019-04-24 ENCOUNTER — Encounter: Payer: Self-pay | Admitting: Allergy and Immunology

## 2019-04-24 ENCOUNTER — Ambulatory Visit (INDEPENDENT_AMBULATORY_CARE_PROVIDER_SITE_OTHER): Payer: Medicare Other | Admitting: Allergy and Immunology

## 2019-04-24 ENCOUNTER — Ambulatory Visit: Payer: Medicare Other | Admitting: Allergy and Immunology

## 2019-04-24 VITALS — BP 96/64 | HR 63 | Temp 98.1°F | Resp 12 | Ht 62.5 in | Wt 110.6 lb

## 2019-04-24 DIAGNOSIS — T783XXA Angioneurotic edema, initial encounter: Secondary | ICD-10-CM | POA: Insufficient documentation

## 2019-04-24 DIAGNOSIS — K2970 Gastritis, unspecified, without bleeding: Secondary | ICD-10-CM | POA: Diagnosis not present

## 2019-04-24 DIAGNOSIS — J310 Chronic rhinitis: Secondary | ICD-10-CM

## 2019-04-24 DIAGNOSIS — L500 Allergic urticaria: Secondary | ICD-10-CM | POA: Diagnosis not present

## 2019-04-24 DIAGNOSIS — T7840XD Allergy, unspecified, subsequent encounter: Secondary | ICD-10-CM | POA: Diagnosis not present

## 2019-04-24 DIAGNOSIS — D509 Iron deficiency anemia, unspecified: Secondary | ICD-10-CM | POA: Diagnosis not present

## 2019-04-24 DIAGNOSIS — T783XXD Angioneurotic edema, subsequent encounter: Secondary | ICD-10-CM | POA: Diagnosis not present

## 2019-04-24 DIAGNOSIS — D510 Vitamin B12 deficiency anemia due to intrinsic factor deficiency: Secondary | ICD-10-CM | POA: Diagnosis not present

## 2019-04-24 DIAGNOSIS — J4520 Mild intermittent asthma, uncomplicated: Secondary | ICD-10-CM | POA: Diagnosis not present

## 2019-04-24 LAB — HEMOGLOBIN A1C
Est. average glucose Bld gHb Est-mCnc: 123 mg/dL
Hgb A1c MFr Bld: 5.9 % — ABNORMAL HIGH (ref 4.8–5.6)

## 2019-04-24 MED ORDER — LEVOCETIRIZINE DIHYDROCHLORIDE 5 MG PO TABS: 5.0000 mg | ORAL_TABLET | Freq: Every evening | ORAL | 5 refills | Status: DC

## 2019-04-24 MED ORDER — FAMOTIDINE 20 MG PO TABS: 20.0000 mg | ORAL_TABLET | Freq: Two times a day (BID) | ORAL | 5 refills | Status: DC

## 2019-04-24 MED ORDER — ALBUTEROL SULFATE HFA 108 (90 BASE) MCG/ACT IN AERS: 1.0000 | INHALATION_SPRAY | Freq: Four times a day (QID) | RESPIRATORY_TRACT | 1 refills | Status: DC | PRN

## 2019-04-24 MED ORDER — EPINEPHRINE 0.3 MG/0.3ML IJ SOAJ: 0.3000 mg | INTRAMUSCULAR | 1 refills | Status: DC | PRN

## 2019-04-24 NOTE — Assessment & Plan Note (Signed)
   A refill prescription has been provided for albuterol HFA, 1 to 2 inhalations every 4-6 hours if needed.  Subjective and objective measures of pulmonary function will be followed and the treatment plan will be adjusted accordingly. 

## 2019-04-24 NOTE — Assessment & Plan Note (Addendum)
Unclear etiology. Skin tests to select food allergens were negative today. NSAIDs and emotional stress commonly exacerbate urticaria but are not the underlying etiology in this case. Physical urticarias are negative by history (i.e. pressure-induced, temperature, vibration, solar, etc.). History and lesions are not consistent with urticaria pigmentosa so I am not suspicious for mastocytosis.  Over the past 15 years she has had one episode with symptoms concerning for the possibility of anaphylaxis.  We will rule out other potential etiologies with labs. For symptom relief, patient is to take oral antihistamines as directed.  The following labs have been ordered: FCeRI antibody, anti-thyroglobulin antibody, thyroid peroxidase antibody, tryptase, urea breath test, CBC, CMP, ESR, ANA, and galactose-alpha-1,3-galactose IgE level.  The patient will be called with further recommendations after lab results have returned.  Instructions have been discussed and provided for H1/H2 receptor blockade with titration to find lowest effective dose.  A prescription has been provided for levocetirizine (Xyzal), 5 mg daily as needed.  A prescription has been provided for famotidine (Pepcid), 20 mg twice daily as needed.  Given her history of throat tightness on one occasion with the urticaria and angioedema, she will continue to have access to epinephrine autoinjectors.  Should there be a significant increase or change in symptoms, a journal is to be kept recording any foods eaten, beverages consumed, medications taken within a 6 hour period prior to the onset of symptoms, as well as record activities being performed, and environmental conditions. For any symptoms concerning for anaphylaxis, epinephrine is to be administered and 911 is to be called immediately.

## 2019-04-24 NOTE — Assessment & Plan Note (Signed)
Associated angioedema occurs in up to 50% of patients with chronic urticaria.  Treatment/diagnostic plan as outlined above. 

## 2019-04-24 NOTE — Patient Instructions (Addendum)
Recurrent urticaria Unclear etiology. Skin tests to select food allergens were negative today. NSAIDs and emotional stress commonly exacerbate urticaria but are not the underlying etiology in this case. Physical urticarias are negative by history (i.e. pressure-induced, temperature, vibration, solar, etc.). History and lesions are not consistent with urticaria pigmentosa so I am not suspicious for mastocytosis.  Over the past 15 years she has had one episode with symptoms concerning for the possibility of anaphylaxis.  We will rule out other potential etiologies with labs. For symptom relief, patient is to take oral antihistamines as directed.  The following labs have been ordered: FCeRI antibody, anti-thyroglobulin antibody, thyroid peroxidase antibody, tryptase, urea breath test, CBC, CMP, ESR, ANA, and galactose-alpha-1,3-galactose IgE level.  The patient will be called with further recommendations after lab results have returned.  Instructions have been discussed and provided for H1/H2 receptor blockade with titration to find lowest effective dose.  A prescription has been provided for levocetirizine (Xyzal), 5 mg daily as needed.  A prescription has been provided for famotidine (Pepcid), 20 mg twice daily as needed.  Given her history of throat tightness on one occasion with the urticaria and angioedema, she will continue to have access to epinephrine autoinjectors.  Should there be a significant increase or change in symptoms, a journal is to be kept recording any foods eaten, beverages consumed, medications taken within a 6 hour period prior to the onset of symptoms, as well as record activities being performed, and environmental conditions. For any symptoms concerning for anaphylaxis, epinephrine is to be administered and 911 is to be called immediately.  Angioedema Associated angioedema occurs in up to 50% of patients with chronic urticaria.  Treatment/diagnostic plan as outlined  above.  Mild intermittent asthma  A refill prescription has been provided for albuterol HFA, 1 to 2 inhalations every 4-6 hours if needed.  Subjective and objective measures of pulmonary function will be followed and the treatment plan will be adjusted accordingly.  Chronic rhinitis  A prescription has been provided for fluticasone nasal spray, 2 sprays per nostril daily as needed. Proper nasal spray technique has been discussed and demonstrated.  Nasal saline spray (i.e., Simply Saline) or nasal saline lavage (i.e., NeilMed) is recommended as needed and prior to medicated nasal sprays.   When lab results have returned the patient will be called with further recommendations and follow up instructions.   Urticaria (Hives)  . Levocetirizine (Xyzal) 5 mg twice a day and famotidine (Pepcid) 20 mg twice a day. If no symptoms for 7-14 days then decrease to. . Levocetirizine (Xyzal) 5 mg twice a day and famotidine (Pepcid) 20 mg once a day.  If no symptoms for 7-14 days then decrease to. . Levocetirizine (Xyzal) 5 mg twice a day.  If no symptoms for 7-14 days then decrease to. . Levocetirizine (Xyzal) 5 mg once a day.  May use Benadryl (diphenhydramine) as needed for breakthrough symptoms       If symptoms return, then step up dosage

## 2019-04-24 NOTE — Assessment & Plan Note (Signed)
   A prescription has been provided for fluticasone nasal spray, 2 sprays per nostril daily as needed. Proper nasal spray technique has been discussed and demonstrated.  Nasal saline spray (i.e., Simply Saline) or nasal saline lavage (i.e., NeilMed) is recommended as needed and prior to medicated nasal sprays. 

## 2019-04-24 NOTE — Progress Notes (Signed)
New Patient Note  RE: Sherri [name] MRN: [id number] DOB: 03-27-1966 Date of Office Visit: 04/24/2019  Referring provider: Minette Brine, FNP Primary care provider: Minette Brine, FNP  Chief Complaint: Allergic Reaction; Urticaria; and Angioedema   History of present illness: Sherri [name] is a 53 y.o. female seen today in consultation requested by Minette Brine, FNP.  Over the past 15 years, Sherri [name] has experienced recurrent episodes of hives. The hives have appeared at different times over her entire body.  The lesions are described as erythematous, raised, and pruritic.  Individual hives last less than 24 hours without leaving residual pigmentation or bruising. She has experienced associated angioedema of the lips and face but denies concomitant cardiopulmonary or GI symptoms with the exception of throat swelling on one occasion. On that occasion she required evaluation and treatment in the ER. She has not experienced unexpected weight loss, recurrent fevers or drenching night sweats. No specific medication, food, skin care product, detergent, soap, or other environmental triggers have been identified. The symptoms do not seem to correlate with NSAIDs use or emotional stress. She did not have symptoms consistent with a respiratory tract infection at the time of symptom onset. Sherri [name] has tried to control symptoms with OTC antihistamines which have offered adequate temporary relief. She has been evaluated and treated in the emergency department for these symptoms. Skin biopsy has not been performed. Recently, she has been experiencing hives daily. She has experienced episodes of asthma symptoms, predominantly wheezing, since 2010.  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  She typically requires albuterol a few times per month on average.  She does not experience nocturnal awakenings due to lower respiratory  symptoms. She experiences rhinorrhea and ocular pruritus.  No significant seasonal symptom variation has been noted nor have specific environmental triggers been identified.  She attempts to control the symptoms with oral antihistamines and allergy eyedrops.  Assessment and plan: Recurrent urticaria Unclear etiology. Skin tests to select food allergens were negative today. NSAIDs and emotional stress commonly exacerbate urticaria but are not the underlying etiology in this case. Physical urticarias are negative by history (i.e. pressure-induced, temperature, vibration, solar, etc.). History and lesions are not consistent with urticaria pigmentosa so I am not suspicious for mastocytosis.  Over the past 15 years she has had one episode with symptoms concerning for the possibility of anaphylaxis.  We will rule out other potential etiologies with labs. For symptom relief, patient is to take oral antihistamines as directed.  The following labs have been ordered: FCeRI antibody, anti-thyroglobulin antibody, thyroid peroxidase antibody, tryptase, urea breath test, CBC, CMP, ESR, ANA, and galactose-alpha-1,3-galactose IgE level.  The patient will be called with further recommendations after lab results have returned.  Instructions have been discussed and provided for H1/H2 receptor blockade with titration to find lowest effective dose.  A prescription has been provided for levocetirizine (Xyzal), 5 mg daily as needed.  A prescription has been provided for famotidine (Pepcid), 20 mg twice daily as needed.  Given her history of throat tightness on one occasion with the urticaria and angioedema, she will continue to have access to epinephrine autoinjectors.  Should there be a significant increase or change in symptoms, a journal is to be kept recording any foods eaten, beverages consumed, medications taken within a 6 hour period prior to the onset of symptoms, as well as record activities being performed,  and environmental conditions. For any symptoms  concerning for anaphylaxis, epinephrine is to be administered and 911 is to be called immediately.  Angioedema Associated angioedema occurs in up to 50% of patients with chronic urticaria.  Treatment/diagnostic plan as outlined above.  Mild intermittent asthma  A refill prescription has been provided for albuterol HFA, 1 to 2 inhalations every 4-6 hours if needed.  Subjective and objective measures of pulmonary function will be followed and the treatment plan will be adjusted accordingly.  Chronic rhinitis  A prescription has been provided for fluticasone nasal spray, 2 sprays per nostril daily as needed. Proper nasal spray technique has been discussed and demonstrated.  Nasal saline spray (i.e., Simply Saline) or nasal saline lavage (i.e., NeilMed) is recommended as needed and prior to medicated nasal sprays.   Meds ordered this encounter  Medications   levocetirizine (XYZAL) 5 MG tablet    Sig: Take 1 tablet (5 mg total) by mouth every evening.    Dispense:  30 tablet    Refill:  5   famotidine (PEPCID) 20 MG tablet    Sig: Take 1 tablet (20 mg total) by mouth 2 (two) times daily.    Dispense:  60 tablet    Refill:  5   albuterol (VENTOLIN HFA) 108 (90 Base) MCG/ACT inhaler    Sig: Inhale 1 puff into the lungs every 6 (six) hours as needed. PRO AIR    Dispense:  18 g    Refill:  1   EPINEPHrine (EPIPEN 2-PAK) 0.3 mg/0.3 mL IJ SOAJ injection    Sig: Inject 0.3 mLs (0.3 mg total) into the muscle as needed for anaphylaxis.    Dispense:  2 Device    Refill:  1    Diagnostics: Spirometry: Normal with an FEV1 of 99% predicted.  Please see scanned spirometry results for details. Environmental skin testing: Negative despite a positive histamine control. Food allergen skin testing: Negative despite a positive histamine control.    Physical examination: Blood pressure 96/64, pulse 63, temperature 98.1 F (36.7 C), temperature  source Oral, resp. rate 12, height 5' 2.5" (1.588 m), weight 110 lb 9.6 oz (50.2 kg), last menstrual period 07/29/2012, SpO2 98 %.  General: Alert, interactive, in no acute distress. HEENT: TMs pearly gray, turbinates moderately edematous without discharge, post-pharynx mildly erythematous. Neck: Supple without lymphadenopathy. Lungs: Clear to auscultation without wheezing, rhonchi or rales. CV: Normal S1, S2 without murmurs. Abdomen: Nondistended, nontender. Skin: Warm and dry, without lesions or rashes. Extremities:  No clubbing, cyanosis or edema. Neuro:   Grossly intact.  Review of systems:  Review of systems negative except as noted in HPI / PMHx or noted below: Review of Systems  Constitutional: Negative.   HENT: Negative.   Eyes: Negative.   Respiratory: Negative.   Cardiovascular: Negative.   Gastrointestinal: Negative.   Genitourinary: Negative.   Musculoskeletal: Negative.   Skin: Negative.   Neurological: Negative.   Endo/Heme/Allergies: Negative.   Psychiatric/Behavioral: Negative.     Past medical history:  Past Medical History:  Diagnosis Date   Angioedema    Anxiety    Arthritis    Asthma    CAP (community acquired pneumonia) 08/29/2012   Chronic back pain    Chronic kidney disease    Cocaine abuse ([place])    Depression    Diarrhea    DVT (deep venous thrombosis) (HCC)    Environmental allergies    Fall    GERD (gastroesophageal reflux disease)    Heart murmur    has been told once that she  has a heart murmur, but has never had any problems   Hiatal hernia 03/21/2013   Lumbar herniated disc    Peripheral vascular disease (HCC)    Pernicious anemia 10/30/2011   Postconcussion syndrome 12/30/2014   Seasonal allergies    Stiff person syndrome    Urticaria     Past surgical history:  Past Surgical History:  Procedure Laterality Date   BUNIONECTOMY Left    colonoscopy  07/09/15   inguinal hernia 1983  1983   OPEN REDUCTION  INTERNAL FIXATION (ORIF) DISTAL RADIAL FRACTURE Right 08/16/2016   Procedure: OPEN REDUCTION INTERNAL FIXATION (ORIF) DISTAL RADIAL FRACTURE;  Surgeon: Marybelle Killings, MD;  Location: [facility];  Service: Orthopedics;  Laterality: Right;    Family history: Family History  Problem Relation Age of Onset   Cancer Mother    Other Mother    COPD Father    Asthma Brother    Cancer Brother        colon   Heart attack Brother    Seizures Brother    Allergic rhinitis Neg Hx    Angioedema Neg Hx    Eczema Neg Hx    Immunodeficiency Neg Hx    Urticaria Neg Hx     Social history: Social History   Socioeconomic History   Marital status: Divorced    Spouse name: Not on file   Number of children: 1   Years of education: college   Highest education level: Not on file  Occupational History   Occupation: disabled  Social Designer, fashion/clothing strain: Not hard at all   Food insecurity:    Worry: Never true    Inability: Never true   Transportation needs:    Medical: No    Non-medical: No  Tobacco Use   Smoking status: Never Smoker   Smokeless tobacco: Never Used   Tobacco comment: Never Used Tobacco  Substance and Sexual Activity   Alcohol use: Yes    Alcohol/week: 0.0 standard drinks    Comment: Occasional   Drug use: Not Currently   Sexual activity: Not Currently    Birth control/protection: Condom  Lifestyle   Physical activity:    Days per week: 2 days    Minutes per session: 30 min   Stress: Not at all  Relationships   Social connections:    Talks on phone: Not on file    Gets together: Not on file    Attends religious service: Not on file    Active member of club or organization: Not on file    Attends meetings of clubs or organizations: Not on file    Relationship status: Not on file   Intimate partner violence:    Fear of current or ex partner: No    Emotionally abused: No    Physically abused: No    Forced sexual activity: No    Other Topics Concern   Not on file  Social History Narrative   Patient is right and left handed.   Patient drinks some caffeine occasionally.   Environmental History: The patient lives in an apartment with carpeting throughout, gas heat, and central air.  There is no known mold or water damage in the home.  She is a non-smoker without pets.  Allergies as of 04/24/2019      Reactions   Ibuprofen Other (See Comments)   Does not take due to hx of renal insufficiency "I have kidney disease"    Lemon Flavor Swelling   Severe  Lip Swelling  FRUIT per pt.     Amoxicillin Diarrhea, Other (See Comments)   Tylenol [acetaminophen] Hives   Cannot take large quantities   Amoxicillin Diarrhea      Medication List       Accurate as of April 24, 2019 12:32 PM. Always use your most recent med list.        ACCU-CHEK SIMPLICITY TEST STRP VI by In Vitro route. Insert 1 by subcutaneous route 4 times every day check blood sugar before breakfast, lunch and dinner and bedtime   albuterol 108 (90 Base) MCG/ACT inhaler Commonly known as:  VENTOLIN HFA Inhale 1 puff into the lungs every 6 (six) hours as needed. PRO AIR   baclofen 20 MG tablet Commonly known as:  LIORESAL TAKE ONE & ONE-HALF TABLETS BY MOUTH IN THE MORNING AND IN THE EVENING AND ONE AT  MIDDAY   cetirizine 10 MG tablet Commonly known as:  ZYRTEC Take 10 mg by mouth daily.   Cranberry Plus Vitamin C 4200-20-3 MG-MG-UNIT Caps Generic drug:  Cranberry-Vitamin C-Vitamin E Take by mouth.   cyanocobalamin 1000 MCG/ML injection Commonly known as:  (VITAMIN B-12) Inject 1,000 mcg into the muscle every 3 (three) months.   desloratadine 5 MG tablet Commonly known as:  CLARINEX Take 1 tablet (5 mg total) by mouth daily.   diazepam 10 MG tablet Commonly known as:  VALIUM Take 2 tablets (20 mg total) by mouth 3 (three) times daily.   diphenhydrAMINE 25 MG tablet Commonly known as:  BENADRYL Take 25 mg by mouth at bedtime as  needed for itching or allergies.   EPINEPHrine 0.3 mg/0.3 mL Soaj injection Commonly known as:  EpiPen 2-Pak Inject 0.3 mLs (0.3 mg total) into the muscle as needed for anaphylaxis.   famotidine 40 MG tablet Commonly known as:  PEPCID Take 75 mg by mouth daily.   famotidine 20 MG tablet Commonly known as:  Pepcid Take 1 tablet (20 mg total) by mouth 2 (two) times daily.   FLUoxetine 10 MG capsule Commonly known as:  PROZAC TAKE 1 CAPSULE BY MOUTH  DAILY   gabapentin 300 MG capsule Commonly known as:  NEURONTIN TAKE 1 CAPSULE BY MOUTH TWO TIMES DAILY   hydrOXYzine 100 MG capsule Commonly known as:  VISTARIL Take 1 capsule (100 mg total) by mouth 3 (three) times daily as needed for itching.   levocetirizine 5 MG tablet Commonly known as:  XYZAL Take 1 tablet (5 mg total) by mouth every evening.   metFORMIN 500 MG tablet Commonly known as:  GLUCOPHAGE TAKE 1 TABLET BY MOUTH TWICE A DAY WITH MORNING AND EVENING MEALS   Pazeo 0.7 % Soln Generic drug:  Olopatadine HCl Apply 1 drop to eye daily.   vitamin C 1000 MG tablet Take 1,000 mg by mouth daily.   Vitamin D3 125 MCG (5000 UT) Caps Take by mouth.   vitamin E 400 UNIT capsule Generic drug:  vitamin E Take 400 Units by mouth daily.       Known medication allergies: Allergies  Allergen Reactions   Ibuprofen Other (See Comments)    Does not take due to hx of renal insufficiency "I have kidney disease"    Lemon Flavor Swelling    Severe Lip Swelling  FRUIT per pt.     Amoxicillin Diarrhea and Other (See Comments)   Tylenol [Acetaminophen] Hives    Cannot take large quantities   Amoxicillin Diarrhea    I appreciate the opportunity to take part in  Sherri [name]'s care. Please do not hesitate to contact me with questions.  Sincerely,   R. Edgar Frisk, MD

## 2019-04-25 LAB — H. PYLORI BREATH TEST: H pylori Breath Test: NEGATIVE

## 2019-04-29 ENCOUNTER — Encounter: Payer: Self-pay | Admitting: *Deleted

## 2019-04-29 LAB — COMPREHENSIVE METABOLIC PANEL
ALT: 13 IU/L (ref 0–32)
AST: 11 IU/L (ref 0–40)
Albumin/Globulin Ratio: 1.9 (ref 1.2–2.2)
Albumin: 4.7 g/dL (ref 3.8–4.9)
Alkaline Phosphatase: 84 IU/L (ref 39–117)
BUN/Creatinine Ratio: 13 (ref 9–23)
BUN: 14 mg/dL (ref 6–24)
Bilirubin Total: <0.2 mg/dL (ref 0.0–1.2)
CO2: 22 mmol/L (ref 20–29)
Calcium: 10.5 mg/dL — ABNORMAL HIGH (ref 8.7–10.2)
Chloride: 103 mmol/L (ref 96–106)
Creatinine, Ser: 1.04 mg/dL — ABNORMAL HIGH (ref 0.57–1.00)
GFR calc Af Amer: 70 mL/min/{1.73_m2} (ref >59)
GFR calc non Af Amer: 61 mL/min/{1.73_m2} (ref >59)
Globulin, Total: 2.5 g/dL (ref 1.5–4.5)
Glucose: 93 mg/dL (ref 65–99)
Potassium: 5.2 mmol/L (ref 3.5–5.2)
Sodium: 145 mmol/L — ABNORMAL HIGH (ref 134–144)
Total Protein: 7.2 g/dL (ref 6.0–8.5)

## 2019-04-29 LAB — ALPHA-GAL PANEL
Alpha Gal IgE*: <0.1 kU/L (ref <0.10)
Beef (Bos spp) IgE: <0.1 kU/L (ref <0.35)
Class Interpretation: 0
Class Interpretation: 0
Class Interpretation: 0
Lamb/Mutton (Ovis spp) IgE: <0.1 kU/L (ref <0.35)
Pork (Sus spp) IgE: <0.1 kU/L (ref <0.35)

## 2019-04-29 LAB — THYROID PEROXIDASE ANTIBODY: Thyroperoxidase Ab SerPl-aCnc: 37 IU/mL — ABNORMAL HIGH (ref 0–34)

## 2019-04-29 LAB — ANA: Anti Nuclear Antibody (ANA): NEGATIVE

## 2019-04-29 LAB — CBC
Hematocrit: 35 % (ref 34.0–46.6)
Hemoglobin: 12 g/dL (ref 11.1–15.9)
MCH: 28.8 pg (ref 26.6–33.0)
MCHC: 34.3 g/dL (ref 31.5–35.7)
MCV: 84 fL (ref 79–97)
Platelets: 277 10*3/uL (ref 150–450)
RBC: 4.17 x10E6/uL (ref 3.77–5.28)
RDW: 13.1 % (ref 11.7–15.4)
WBC: 6.3 10*3/uL (ref 3.4–10.8)

## 2019-04-29 LAB — THYROGLOBULIN ANTIBODY: Thyroglobulin Antibody: <1 IU/mL (ref 0.0–0.9)

## 2019-04-29 LAB — TRYPTASE: Tryptase: 10.1 ug/L (ref 2.2–13.2)

## 2019-04-29 LAB — SEDIMENTATION RATE: Sed Rate: 16 mm/hr (ref 0–40)

## 2019-05-07 ENCOUNTER — Other Ambulatory Visit: Payer: Self-pay | Admitting: Neurology

## 2019-05-07 ENCOUNTER — Other Ambulatory Visit: Payer: Self-pay | Admitting: Nurse Practitioner

## 2019-05-07 ENCOUNTER — Telehealth: Payer: Self-pay

## 2019-05-07 MED ORDER — DIAZEPAM 10 MG PO TABS: 20.0000 mg | ORAL_TABLET | Freq: Three times a day (TID) | ORAL | 1 refills | Status: DC

## 2019-05-07 NOTE — Telephone Encounter (Addendum)
Pt notified via phone rx has been [facility].

## 2019-05-07 NOTE — Telephone Encounter (Signed)
Pt states that [facility] is needing a new prescription for her diazepam (VALIUM) 10 MG tablet. Pt states she will run out before the two weeks that they are telling her it takes. They informed her to reach out to the RN to look out for the fax so that they can try to get it to her before the two weeks. Please advise.

## 2019-05-07 NOTE — Telephone Encounter (Signed)
Events noted, the falls could potentially be in part a side effect of the diazepam, if they continue, we may have to reduce the diazepam dosing.

## 2019-05-07 NOTE — Telephone Encounter (Signed)
I contacted the patient and advised we have submitted the electronic rx for Valium. Patient verbalized appreciation.  Patient wanted MD to be aware she fell twice in January and once in Feb. Patient states she failed to mention this during her video visit on 04/15/19. Patient describes these falls as minor with no injury related.   I advised I would send message to update Dr. Anne Hahn and pt was agreeable.

## 2019-05-07 NOTE — Telephone Encounter (Signed)
I checked [place] drug registry. Last refill was 03/25/19 # 180 for a 30 day rx.

## 2019-05-08 ENCOUNTER — Other Ambulatory Visit: Payer: Self-pay

## 2019-05-08 ENCOUNTER — Telehealth: Payer: Self-pay

## 2019-05-08 DIAGNOSIS — R21 Rash and other nonspecific skin eruption: Secondary | ICD-10-CM

## 2019-05-08 MED ORDER — HYDROXYZINE PAMOATE 100 MG PO CAPS: 100.0000 mg | ORAL_CAPSULE | Freq: Three times a day (TID) | ORAL | 0 refills | Status: DC | PRN

## 2019-05-08 NOTE — Telephone Encounter (Signed)
Patient returned my call and requested I send the hydroxyzine to [facility]. YRL,RMA

## 2019-05-08 NOTE — Telephone Encounter (Signed)
I called pt to see if she wants her hydroxyzine sent to [facility] or to her local pharmacy we have received a request from [facility]. YRL,RMA

## 2019-05-18 ENCOUNTER — Other Ambulatory Visit: Payer: Self-pay | Admitting: Neurology

## 2019-05-27 ENCOUNTER — Telehealth: Payer: Self-pay | Admitting: Hematology & Oncology

## 2019-05-27 ENCOUNTER — Telehealth: Payer: Self-pay | Admitting: Family

## 2019-05-27 NOTE — Telephone Encounter (Signed)
LMVM for patient regarding r/s appts per 6/2 phone message

## 2019-05-27 NOTE — Telephone Encounter (Signed)
LMVM for patient to call back in order to prescreen for 6/3 appt

## 2019-05-28 ENCOUNTER — Other Ambulatory Visit: Payer: Medicare Other

## 2019-05-28 ENCOUNTER — Ambulatory Visit: Payer: Medicare Other | Admitting: Family

## 2019-06-04 ENCOUNTER — Ambulatory Visit: Payer: Medicare Other | Admitting: Family

## 2019-06-04 ENCOUNTER — Other Ambulatory Visit: Payer: Medicare Other

## 2019-06-10 ENCOUNTER — Telehealth: Payer: Self-pay | Admitting: *Deleted

## 2019-06-10 NOTE — Telephone Encounter (Signed)
Message received from patient requesting to reschedule appt for 06/11/19.  Message sent to scheduling.

## 2019-06-10 NOTE — Telephone Encounter (Signed)
Pt called to office for ?uti and std check. Pt has appt on 6/24 for this problem.  Attempt to return call to discuss. LM on VM to call if needed or follow up at appt time.

## 2019-06-11 ENCOUNTER — Inpatient Hospital Stay: Payer: Medicare Other

## 2019-06-11 ENCOUNTER — Inpatient Hospital Stay: Payer: Medicare Other | Admitting: Family

## 2019-06-14 ENCOUNTER — Other Ambulatory Visit: Payer: Self-pay | Admitting: Nurse Practitioner

## 2019-06-14 DIAGNOSIS — R21 Rash and other nonspecific skin eruption: Secondary | ICD-10-CM

## 2019-06-18 ENCOUNTER — Ambulatory Visit: Payer: Medicare Other

## 2019-06-23 ENCOUNTER — Inpatient Hospital Stay: Payer: Medicare Other | Admitting: Family

## 2019-06-23 ENCOUNTER — Other Ambulatory Visit: Payer: Medicare Other

## 2019-06-25 ENCOUNTER — Ambulatory Visit: Payer: Medicare Other

## 2019-06-26 ENCOUNTER — Other Ambulatory Visit: Payer: Self-pay | Admitting: Nurse Practitioner

## 2019-07-01 ENCOUNTER — Inpatient Hospital Stay: Payer: Medicare Other

## 2019-07-01 ENCOUNTER — Inpatient Hospital Stay: Payer: Medicare Other | Admitting: Family

## 2019-07-01 ENCOUNTER — Ambulatory Visit: Payer: Medicare Other

## 2019-07-04 ENCOUNTER — Encounter: Payer: Self-pay | Admitting: Family

## 2019-07-04 ENCOUNTER — Inpatient Hospital Stay (HOSPITAL_BASED_OUTPATIENT_CLINIC_OR_DEPARTMENT_OTHER): Payer: Medicare Other | Admitting: Family

## 2019-07-04 ENCOUNTER — Inpatient Hospital Stay: Payer: Medicare Other | Attending: Hematology & Oncology

## 2019-07-04 ENCOUNTER — Other Ambulatory Visit: Payer: Self-pay

## 2019-07-04 ENCOUNTER — Inpatient Hospital Stay: Payer: Medicare Other

## 2019-07-04 VITALS — BP 104/67 | HR 88 | Temp 98.9°F | Resp 18 | Ht 62.0 in | Wt 104.1 lb

## 2019-07-04 DIAGNOSIS — Z86718 Personal history of other venous thrombosis and embolism: Secondary | ICD-10-CM

## 2019-07-04 DIAGNOSIS — E611 Iron deficiency: Secondary | ICD-10-CM | POA: Insufficient documentation

## 2019-07-04 DIAGNOSIS — D510 Vitamin B12 deficiency anemia due to intrinsic factor deficiency: Secondary | ICD-10-CM | POA: Diagnosis not present

## 2019-07-04 DIAGNOSIS — Z79899 Other long term (current) drug therapy: Secondary | ICD-10-CM

## 2019-07-04 DIAGNOSIS — D508 Other iron deficiency anemias: Secondary | ICD-10-CM

## 2019-07-04 DIAGNOSIS — D500 Iron deficiency anemia secondary to blood loss (chronic): Secondary | ICD-10-CM

## 2019-07-04 LAB — CBC WITH DIFFERENTIAL (CANCER CENTER ONLY)
Abs Immature Granulocytes: 0.01 10*3/uL (ref 0.00–0.07)
Basophils Absolute: 0 10*3/uL (ref 0.0–0.1)
Basophils Relative: 0 %
Eosinophils Absolute: 0 10*3/uL (ref 0.0–0.5)
Eosinophils Relative: 0 %
HCT: 35.7 % — ABNORMAL LOW (ref 36.0–46.0)
Hemoglobin: 11.4 g/dL — ABNORMAL LOW (ref 12.0–15.0)
Immature Granulocytes: 0 %
Lymphocytes Relative: 29 %
Lymphs Abs: 1.5 10*3/uL (ref 0.7–4.0)
MCH: 28.2 pg (ref 26.0–34.0)
MCHC: 31.9 g/dL (ref 30.0–36.0)
MCV: 88.4 fL (ref 80.0–100.0)
Monocytes Absolute: 0.3 10*3/uL (ref 0.1–1.0)
Monocytes Relative: 6 %
Neutro Abs: 3.2 10*3/uL (ref 1.7–7.7)
Neutrophils Relative %: 65 %
Platelet Count: 275 10*3/uL (ref 150–400)
RBC: 4.04 MIL/uL (ref 3.87–5.11)
RDW: 13.1 % (ref 11.5–15.5)
WBC Count: 5 10*3/uL (ref 4.0–10.5)
nRBC: 0 % (ref 0.0–0.2)

## 2019-07-04 LAB — CMP (CANCER CENTER ONLY)
ALT: 12 U/L (ref 0–44)
AST: 11 U/L — ABNORMAL LOW (ref 15–41)
Albumin: 4.1 g/dL (ref 3.5–5.0)
Alkaline Phosphatase: 62 U/L (ref 38–126)
Anion gap: 8 (ref 5–15)
BUN: 14 mg/dL (ref 6–20)
CO2: 29 mmol/L (ref 22–32)
Calcium: 9.2 mg/dL (ref 8.9–10.3)
Chloride: 108 mmol/L (ref 98–111)
Creatinine: 1.01 mg/dL — ABNORMAL HIGH (ref 0.44–1.00)
GFR, Est AFR Am: >60 mL/min (ref >60)
GFR, Estimated: >60 mL/min (ref >60)
Glucose, Bld: 110 mg/dL — ABNORMAL HIGH (ref 70–99)
Potassium: 4 mmol/L (ref 3.5–5.1)
Sodium: 145 mmol/L (ref 135–145)
Total Bilirubin: 0.3 mg/dL (ref 0.3–1.2)
Total Protein: 7 g/dL (ref 6.5–8.1)

## 2019-07-04 LAB — VITAMIN B12: Vitamin B-12: 209 pg/mL (ref 180–914)

## 2019-07-04 MED ORDER — CYANOCOBALAMIN 1000 MCG/ML IJ SOLN
1000.0000 ug | Freq: Once | INTRAMUSCULAR | Status: AC
  Administered 2019-07-04: 1000 ug via INTRAMUSCULAR

## 2019-07-04 MED ORDER — CYANOCOBALAMIN 1000 MCG/ML IJ SOLN
INTRAMUSCULAR | Status: AC
  Filled 2019-07-04: qty 1

## 2019-07-04 NOTE — Patient Instructions (Signed)

## 2019-07-04 NOTE — Progress Notes (Signed)
Hematology and Oncology Follow Up Visit  Sherri SlimmerSamantha [name] [id number] 1966-01-13 53 y.o. 07/04/2019   Principle Diagnosis:  Pernicious anemia - anti-intrinsic factor antibodies Intermittent iron - deficiency anemia History of deep venous thrombosis of the right leg  Current Therapy:   Vitamin B12, 1 mg IM Q3 months IV iron as indicated   Interim History:  Sherri [name] is here today for follow-up. She is doing well but has some occasional fatigue.  She notes dizziness at time which she attributes to her blood sugar levels.  She is eating well and staying hydrated. Her weight is stable.  She has had no issue with infection. No fever, chills, n/v, cough, rash, dizziness, SOB, chest pain, palpitations, abdominal pain or changes in bowel or bladder habits.  No swelling, tenderness, numbness or tingling in her extremities at this time.  No adenopathy noted on exam.  No episodes of bleeding, no bruising or petechiae.   ECOG Performance Status: 1 - Symptomatic but completely ambulatory  Medications:  Allergies as of 07/04/2019      Reactions   Ibuprofen Other (See Comments)   Does not take due to hx of renal insufficiency "I have kidney disease"    Lemon Flavor Swelling   Severe Lip Swelling  FRUIT per pt.     Amoxicillin Diarrhea, Other (See Comments)   Tylenol [acetaminophen] Hives   Cannot take large quantities   Amoxicillin Diarrhea      Medication List       Accurate as of July 04, 2019  1:17 PM. If you have any questions, ask your nurse or doctor.        ACCU-CHEK SIMPLICITY TEST STRP VI by In Vitro route. Insert 1 by subcutaneous route 4 times every day check blood sugar before breakfast, lunch and dinner and bedtime   albuterol 108 (90 Base) MCG/ACT inhaler Commonly known as: VENTOLIN HFA Inhale 1 puff into the lungs every 6 (six) hours as needed. PRO AIR   baclofen 20 MG tablet Commonly known as: LIORESAL TAKE ONE & ONE-HALF TABLETS BY MOUTH IN THE MORNING AND IN  THE EVENING AND ONE AT  MIDDAY   cetirizine 10 MG tablet Commonly known as: ZYRTEC TAKE 1 TABLET BY MOUTH EVERY DAY   Cranberry Plus Vitamin C 4200-20-3 MG-MG-UNIT Caps Generic drug: Cranberry-Vitamin C-Vitamin E Take by mouth.   cyanocobalamin 1000 MCG/ML injection Commonly known as: (VITAMIN B-12) Inject 1,000 mcg into the muscle every 3 (three) months.   desloratadine 5 MG tablet Commonly known as: CLARINEX TAKE 1 TABLET BY MOUTH ONCE DAILY   diazepam 10 MG tablet Commonly known as: VALIUM Take 2 tablets (20 mg total) by mouth 3 (three) times daily.   diphenhydrAMINE 25 MG tablet Commonly known as: BENADRYL Take 25 mg by mouth at bedtime as needed for itching or allergies.   EPINEPHrine 0.3 mg/0.3 mL Soaj injection Commonly known as: EpiPen 2-Pak Inject 0.3 mLs (0.3 mg total) into the muscle as needed for anaphylaxis.   famotidine 40 MG tablet Commonly known as: PEPCID Take 75 mg by mouth daily.   famotidine 20 MG tablet Commonly known as: Pepcid Take 1 tablet (20 mg total) by mouth 2 (two) times daily.   FLUoxetine 10 MG capsule Commonly known as: PROZAC TAKE 1 CAPSULE BY MOUTH  DAILY   gabapentin 300 MG capsule Commonly known as: NEURONTIN TAKE 1 CAPSULE BY MOUTH TWO TIMES DAILY   hydrOXYzine 100 MG capsule Commonly known as: VISTARIL TAKE 1 CAPSULE BY MOUTH 3  TIMES  DAILY AS NEEDED FOR  ITCHING   levocetirizine 5 MG tablet Commonly known as: XYZAL Take 1 tablet (5 mg total) by mouth every evening.   metFORMIN 500 MG tablet Commonly known as: GLUCOPHAGE TAKE 1 TABLET BY MOUTH TWICE A DAY WITH MORNING AND EVENING MEALS   Pazeo 0.7 % Soln Generic drug: Olopatadine HCl Apply 1 drop to eye daily.   vitamin C 1000 MG tablet Take 1,000 mg by mouth daily.   Vitamin D3 125 MCG (5000 UT) Caps Take by mouth.   vitamin E 400 UNIT capsule Generic drug: vitamin E Take 400 Units by mouth daily.       Allergies:  Allergies  Allergen Reactions  .  Ibuprofen Other (See Comments)    Does not take due to hx of renal insufficiency "I have kidney disease"   . Lemon Flavor Swelling    Severe Lip Swelling  FRUIT per pt.    . Amoxicillin Diarrhea and Other (See Comments)  . Tylenol [Acetaminophen] Hives    Cannot take large quantities  . Amoxicillin Diarrhea    Past Medical History, Surgical history, Social history, and Family History were reviewed and updated.  Review of Systems: All other 10 point review of systems is negative.   Physical Exam:  vitals were not taken for this visit.   Wt Readings from Last 3 Encounters:  04/24/19 110 lb 9.6 oz (50.2 kg)  04/22/19 107 lb 14.4 oz (48.9 kg)  03/07/19 111 lb 3.2 oz (50.4 kg)    Ocular: Sclerae unicteric, pupils equal, round and reactive to light Ear-nose-throat: Oropharynx clear, dentition fair Lymphatic: No cervical or supraclavicular adenopathy Lungs no rales or rhonchi, good excursion bilaterally Heart regular rate and rhythm, no murmur appreciated Abd soft, nontender, positive bowel sounds, no liver or spleen tip palpated on exam, no fluid wave  MSK no focal spinal tenderness, no joint edema Neuro: non-focal, well-oriented, appropriate affect Breasts: Deferred   Lab Results  Component Value Date   WBC 6.3 04/24/2019   HGB 12.0 04/24/2019   HCT 35.0 04/24/2019   MCV 84 04/24/2019   PLT 277 04/24/2019   Lab Results  Component Value Date   FERRITIN 518 (H) 02/25/2019   IRON 65 02/25/2019   TIBC 196 (L) 02/25/2019   UIBC 131 02/25/2019   IRONPCTSAT 33 02/25/2019   Lab Results  Component Value Date   RETICCTPCT 1.8 12/14/2015   RBC 4.17 04/24/2019   RETICCTABS 71.8 12/14/2015   Lab Results  Component Value Date   KPAFRELGTCHN 1.06 08/02/2011   LAMBDASER 1.49 08/02/2011   KAPLAMBRATIO 0.71 08/02/2011   Lab Results  Component Value Date   IGGSERUM 1180 08/02/2011   IGA 206 08/02/2011   IGMSERUM 198 08/02/2011   Lab Results  Component Value Date    TOTALPROTELP 6.6 08/02/2011   ALBUMINELP 57.1 08/02/2011   A1GS 4.1 08/02/2011   A2GS 9.6 08/02/2011   BETS 6.1 08/02/2011   BETA2SER 5.9 08/02/2011   GAMS 17.2 08/02/2011   MSPIKE NOT DET 08/02/2011   SPEI * 08/02/2011     Chemistry      Component Value Date/Time   NA 145 (H) 04/24/2019 1227   NA 136 11/23/2017 1147   NA 144 03/22/2017 0943   K 5.2 04/24/2019 1227   K 3.9 11/23/2017 1147   K 4.2 03/22/2017 0943   CL 103 04/24/2019 1227   CL 94 (L) 11/23/2017 1147   CO2 22 04/24/2019 1227   CO2 30 11/23/2017 1147  CO2 29 03/22/2017 0943   BUN 14 04/24/2019 1227   BUN 10 11/23/2017 1147   BUN 11.1 03/22/2017 0943   CREATININE 1.04 (H) 04/24/2019 1227   CREATININE 1.02 (H) 02/25/2019 1340   CREATININE 1.0 11/23/2017 1147   CREATININE 1.0 03/22/2017 0943   GLU 88 09/05/2018 1535      Component Value Date/Time   CALCIUM 10.5 (H) 04/24/2019 1227   CALCIUM 10.1 11/23/2017 1147   CALCIUM 9.9 03/22/2017 0943   ALKPHOS 84 04/24/2019 1227   ALKPHOS 174 (H) 11/23/2017 1147   ALKPHOS 114 03/22/2017 0943   AST 11 04/24/2019 1227   AST 11 (L) 02/25/2019 1340   AST 18 03/22/2017 0943   ALT 13 04/24/2019 1227   ALT 7 02/25/2019 1340   ALT 46 11/23/2017 1147   ALT 23 03/22/2017 0943   BILITOT <0.2 04/24/2019 1227   BILITOT 0.3 02/25/2019 1340   BILITOT 0.24 03/22/2017 0943       Impression and Plan: Sherri [name] is a pleasant 53 yo African American female with both B 12 and intermittent iron deficiency. She is symptomatic with fatigued.  She received B 12 today and will remain on her every 3 month injection schedule.  We will see what her iron studies show and bring her back in for infusion if needed.  We will plan to see her back in another 6 months for follow-up.  She will contact our office with any questions or concerns. We can certainly see her sooner if needed.   Sherri GinsSarah Trevelle Mcgurn, NP 7/10/20201:17 PM

## 2019-07-07 ENCOUNTER — Other Ambulatory Visit: Payer: Self-pay

## 2019-07-07 ENCOUNTER — Ambulatory Visit (INDEPENDENT_AMBULATORY_CARE_PROVIDER_SITE_OTHER): Payer: Medicare Other

## 2019-07-07 ENCOUNTER — Other Ambulatory Visit (HOSPITAL_COMMUNITY)
Admission: RE | Admit: 2019-07-07 | Discharge: 2019-07-07 | Disposition: A | Discharge status: Home / Self Care | Payer: Medicare Other | Source: Ambulatory Visit | Attending: Obstetrics and Gynecology | Admitting: Obstetrics and Gynecology

## 2019-07-07 ENCOUNTER — Telehealth: Payer: Self-pay | Admitting: Hematology & Oncology

## 2019-07-07 VITALS — BP 112/78 | HR 83 | Temp 97.9°F | Ht 63.0 in | Wt 108.0 lb

## 2019-07-07 DIAGNOSIS — N390 Urinary tract infection, site not specified: Secondary | ICD-10-CM

## 2019-07-07 DIAGNOSIS — N898 Other specified noninflammatory disorders of vagina: Secondary | ICD-10-CM

## 2019-07-07 DIAGNOSIS — R319 Hematuria, unspecified: Secondary | ICD-10-CM | POA: Diagnosis not present

## 2019-07-07 LAB — POCT URINALYSIS DIPSTICK
Bilirubin, UA: NEGATIVE
Blood, UA: NEGATIVE
Glucose, UA: NEGATIVE
Nitrite, UA: NEGATIVE
Protein, UA: POSITIVE — AB
Spec Grav, UA: 1.01 (ref 1.010–1.025)
Urobilinogen, UA: 0.2 E.U./dL
pH, UA: 5 (ref 5.0–8.0)

## 2019-07-07 LAB — IRON AND TIBC
Iron: 106 ug/dL (ref 41–142)
Saturation Ratios: 43 % (ref 21–57)
TIBC: 250 ug/dL (ref 236–444)
UIBC: 144 ug/dL (ref 120–384)

## 2019-07-07 LAB — FERRITIN: Ferritin: 601 ng/mL — ABNORMAL HIGH (ref 11–307)

## 2019-07-07 NOTE — Telephone Encounter (Signed)
Appointments scheduled letter/calendar mailed per 7/10 los °

## 2019-07-07 NOTE — Progress Notes (Signed)
SUBJECTIVE: Sherri [name] is a 53 y.o. female who complains of Vaginal odor, DISCHARGE urinary frequency, urgency and dysuria x 2 days, without flank pain, fever, chills, or abnormal vaginal discharge or bleeding.   OBJECTIVE: Appears well, in no apparent distress.  Vital signs are normal. Urine dipstick shows positive for leukocytes, ketones, protein.    ASSESSMENT: Dysuria, Vaginal Odor  PLAN: Selfswab and Urine Culture sent to [place].Treatment will be determined per Lab results.  Call or return to clinic prn if these symptoms worsen or fail to improve as anticipated.

## 2019-07-09 ENCOUNTER — Other Ambulatory Visit: Payer: Self-pay | Admitting: Obstetrics

## 2019-07-09 DIAGNOSIS — N3000 Acute cystitis without hematuria: Secondary | ICD-10-CM

## 2019-07-09 LAB — CERVICOVAGINAL ANCILLARY ONLY
Bacterial vaginitis: POSITIVE — AB
Candida vaginitis: NEGATIVE
Chlamydia: NEGATIVE
Neisseria Gonorrhea: NEGATIVE
Trichomonas: NEGATIVE

## 2019-07-09 LAB — URINE CULTURE

## 2019-07-09 MED ORDER — SULFAMETHOXAZOLE-TRIMETHOPRIM 800-160 MG PO TABS: 1.0000 | ORAL_TABLET | Freq: Two times a day (BID) | ORAL | 1 refills | Status: DC

## 2019-07-10 ENCOUNTER — Other Ambulatory Visit: Payer: Self-pay | Admitting: Obstetrics

## 2019-07-10 DIAGNOSIS — N760 Acute vaginitis: Secondary | ICD-10-CM

## 2019-07-10 DIAGNOSIS — B9689 Other specified bacterial agents as the cause of diseases classified elsewhere: Secondary | ICD-10-CM

## 2019-07-10 MED ORDER — TINIDAZOLE 500 MG PO TABS: 1000.0000 mg | ORAL_TABLET | Freq: Every day | ORAL | 2 refills | Status: DC

## 2019-07-24 ENCOUNTER — Ambulatory Visit: Payer: Medicare Other | Admitting: Nurse Practitioner

## 2019-07-29 ENCOUNTER — Other Ambulatory Visit: Payer: Self-pay

## 2019-07-29 MED ORDER — METRONIDAZOLE 500 MG PO TABS: 500.0000 mg | ORAL_TABLET | Freq: Two times a day (BID) | ORAL | 0 refills | Status: DC

## 2019-07-29 NOTE — Progress Notes (Signed)
Received coorpondonce from [facility] Tinidazole is on backorder Flagyl sent to the pharmacy

## 2019-08-08 ENCOUNTER — Encounter (HOSPITAL_COMMUNITY): Payer: Self-pay

## 2019-08-08 ENCOUNTER — Other Ambulatory Visit: Payer: Self-pay

## 2019-08-08 ENCOUNTER — Ambulatory Visit (HOSPITAL_COMMUNITY)
Admission: EM | Admit: 2019-08-08 | Discharge: 2019-08-08 | Disposition: A | Discharge status: Home / Self Care | Payer: Medicare Other

## 2019-08-08 DIAGNOSIS — T7840XA Allergy, unspecified, initial encounter: Secondary | ICD-10-CM | POA: Diagnosis not present

## 2019-08-08 NOTE — ED Triage Notes (Signed)
Pt presents with complaints of hives, swollen lips and her right eye being swollen and itchy that started on Wednesday. Symptoms are improving. Patient unsure what she could be allergic too causing this.

## 2019-08-08 NOTE — ED Provider Notes (Signed)
[facility]    CSN: [id number] Arrival date & time: 08/08/19  1320      History   Chief Complaint Chief Complaint  Patient presents with  . Allergic Reaction    HPI Sherri SlimmerSamantha [name] is a 53 y.o. female.   Sherri [name] presents with complaints of hives to arms and legs, as well as swelling above right eye. States hives started approximately 8/10. she had smoked a "wild cherry" black and mild the night prior which was the first time for her. No other known exposures- no new foods, lotions, products, soaps detergents etc. Hives have since improved. This morning when she woke she noted some swelling above right eye brow. She took her morning medications, which included hydroxyzine and another antihistamine. Swelling has significantly improved by this afternoon. No mouth swelling, tongue or throat itching, wheezing, difficulty breathing. No further rash. States she does have antihistamine eye drops which she has used as well.  No congestion or rhinorrhea. History  Of angioedema per chart review.     ROS per HPI, negative if not otherwise mentioned.      Past Medical History:  Diagnosis Date  . Angioedema   . Anxiety   . Arthritis   . Asthma   . CAP (community acquired pneumonia) 08/29/2012  . Chronic back pain   . Chronic kidney disease   . Cocaine abuse (HCC)   . Depression   . Diarrhea   . DVT (deep venous thrombosis) (HCC)   . Environmental allergies   . Fall   . GERD (gastroesophageal reflux disease)   . Heart murmur    has been told once that she has a heart murmur, but has never had any problems  . Hiatal hernia 03/21/2013  . Lumbar herniated disc   . Peripheral vascular disease (HCC)   . Pernicious anemia 10/30/2011  . Postconcussion syndrome 12/30/2014  . Seasonal allergies   . Stiff person syndrome   . Urticaria     Patient Active Problem List   Diagnosis Date Noted  . Angioedema 04/24/2019  . Chronic rhinitis 04/24/2019  . Closed nondisplaced  fracture of proximal phalanx of right little finger 10/18/2018  . Keloid 10/10/2018  . Diabetes type 2, controlled (HCC) 10/10/2018  . Rash and nonspecific skin eruption 10/10/2018  . Newly diagnosed diabetes (HCC) 01/21/2018  . Depression 01/02/2017  . History of DVT (deep vein thrombosis) 01/02/2017  . Chronic pain syndrome 01/02/2017  . Cocaine abuse (HCC) 01/02/2017  . Dysphagia   . Adjustment insomnia   . Distal radius fracture, right 08/16/2016  . Postconcussion syndrome 12/30/2014  . Dizziness 12/18/2014  . Tremor 03/11/2014  . 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 Hiatal hernia 03/21/2013  . Anemia, iron deficiency 01/29/2013  . Spasm of muscle 09/02/2012  . Benzodiazepine withdrawal (HCC) 08/30/2012  . CAP (community acquired pneumonia) 08/29/2012  . Hypernatremia 08/28/2012  . Metabolic acidosis 08/28/2012  . Leukocytosis 08/28/2012  . UTI (lower urinary tract infection) 08/28/2012  . Anemia 08/28/2012  . Acute back pain 08/27/2012  . Rhabdomyolysis 08/27/2012  . ARF (acute renal failure) (HCC) 08/27/2012  . Hypokalemia 08/27/2012  . Mild intermittent asthma 08/27/2012  . Chronic back pain   . Pernicious anemia 10/30/2011  . DEGENERATIVE DISC DISEASE 08/18/2010  . DEEP VEIN THROMBOSIS/PHLEBITIS 07/28/2010  . Asthma with bronchitis 07/28/2010  . Recurrent urticaria 07/28/2010    Past Surgical History:  Procedure Laterality Date  . BUNIONECTOMY Left   . colonoscopy  07/09/15  . inguinal hernia  1983  1983  . OPEN REDUCTION INTERNAL FIXATION (ORIF) DISTAL RADIAL FRACTURE Right 08/16/2016   Procedure: OPEN REDUCTION INTERNAL FIXATION (ORIF) DISTAL RADIAL FRACTURE;  Surgeon: Eldred MangesMark C Yates, MD;  Location: [facility];  Service: Orthopedics;  Laterality: Right;    OB History    Gravida  1   Para  1   Term  1   Preterm  0   AB  0   Living  1     SAB      TAB      Ectopic      Multiple      Live Births               Home Medications    Prior to  Admission medications   Medication Sig Start Date End Date Taking? Authorizing Provider  albuterol (VENTOLIN HFA) 108 (90 Base) MCG/ACT inhaler Inhale 1 puff into the lungs every 6 (six) hours as needed. PRO AIR 04/24/19  Yes Bobbitt, Heywood Ilesalph Carter, MD  Ascorbic Acid (VITAMIN C) 1000 MG tablet Take 1,000 mg by mouth daily.   Yes [provider]  baclofen (LIORESAL) 20 MG tablet TAKE ONE & ONE-HALF TABLETS BY MOUTH IN THE MORNING AND IN THE EVENING AND ONE AT  MIDDAY 12/20/18  Yes York SpanielWillis, Charles K, MD  cetirizine (ZYRTEC) 10 MG tablet TAKE 1 TABLET BY MOUTH EVERY DAY 06/26/19  Yes Arnette FeltsMoore, Janece, FNP  Cholecalciferol (VITAMIN D3) 5000 units CAPS Take by mouth.   Yes [provider]  cyanocobalamin (,VITAMIN B-12,) 1000 MCG/ML injection Inject 1,000 mcg into the muscle every 3 (three) months.    Yes [provider]  desloratadine (CLARINEX) 5 MG tablet TAKE 1 TABLET BY MOUTH ONCE DAILY 05/07/19  Yes Arnette FeltsMoore, Janece, FNP  diazepam (VALIUM) 10 MG tablet Take 2 tablets (20 mg total) by mouth 3 (three) times daily. 05/07/19  Yes York SpanielWillis, Charles K, MD  diphenhydrAMINE (BENADRYL) 25 MG tablet Take 25 mg by mouth at bedtime as needed for itching or allergies.    Yes [provider]  EPINEPHrine (EPIPEN 2-PAK) 0.3 mg/0.3 mL IJ SOAJ injection Inject 0.3 mLs (0.3 mg total) into the muscle as needed for anaphylaxis. 04/24/19  Yes Bobbitt, Heywood Ilesalph Carter, MD  famotidine (PEPCID) 40 MG tablet Take 75 mg by mouth daily.   Yes [provider]  FLUoxetine (PROZAC) 10 MG capsule TAKE 1 CAPSULE BY MOUTH  DAILY 05/20/19  Yes York SpanielWillis, Charles K, MD  gabapentin (NEURONTIN) 300 MG capsule TAKE 1 CAPSULE BY MOUTH TWO TIMES DAILY 02/17/19  Yes York SpanielWillis, Charles K, MD  Glucose Blood (ACCU-CHEK SIMPLICITY TEST STRP VI) by In Vitro route. Insert 1 by subcutaneous route 4 times every day check blood sugar before breakfast, lunch and dinner and bedtime   Yes [provider]  hydrOXYzine  (VISTARIL) 100 MG capsule TAKE 1 CAPSULE BY MOUTH 3  TIMES DAILY AS NEEDED FOR  ITCHING 06/18/19  Yes Arnette FeltsMoore, Janece, FNP  metFORMIN (GLUCOPHAGE) 500 MG tablet TAKE 1 TABLET BY MOUTH TWICE A DAY WITH MORNING AND EVENING MEALS 12/02/18  Yes Arnette FeltsMoore, Janece, FNP  metroNIDAZOLE (FLAGYL) 500 MG tablet Take 1 tablet (500 mg total) by mouth 2 (two) times daily. 07/29/19  Yes Brock BadHarper, Charles A, MD  Olopatadine HCl (PAZEO) 0.7 % SOLN Apply 1 drop to eye daily.   Yes [provider]  tinidazole (TINDAMAX) 500 MG tablet Take 2 tablets (1,000 mg total) by mouth daily with breakfast. 07/10/19  Yes Brock BadHarper, Charles A, MD  vitamin E (VITAMIN E) 400 UNIT capsule Take 400 Units by mouth daily.   Yes [provider]  Cranberry-Vitamin C-Vitamin E (CRANBERRY PLUS VITAMIN C) 4200-20-3 MG-MG-UNIT CAPS Take by mouth.    [provider]  levocetirizine (XYZAL) 5 MG tablet Take 1 tablet (5 mg total) by mouth every evening. 04/24/19   Bobbitt, Sed[place] Muta, MD  sulfamethoxazole-trimethoprim (BACTRIM DS) 800-160 MG tablet Take 1 tablet by mouth 2 (two) times daily. 07/09/19   Shelly Bombard, MD    Family History Family History  Problem Relation Age of Onset  . Cancer Mother   . Other Mother   . COPD Father   . Asthma Brother   . Cancer Brother        colon  . Heart attack Brother   . Seizures Brother   . Allergic rhinitis Neg Hx   . Angioedema Neg Hx   . Eczema Neg Hx   . Immunodeficiency Neg Hx   . Urticaria Neg Hx     Social History Social History   Tobacco Use  . Smoking status: Never Smoker  . Smokeless tobacco: Never Used  . Tobacco comment: Never Used Tobacco  Substance Use Topics  . Alcohol use: Yes    Alcohol/week: 0.0 standard drinks    Comment: Occasional  . Drug use: Not Currently     Allergies   Ibuprofen, Lemon flavor, Amoxicillin, and Tylenol [acetaminophen]   Review of Systems Review of Systems   Physical Exam Triage Vital Signs ED Triage Vitals  Enc  Vitals Group     BP 08/08/19 1350 122/76     Pulse Rate 08/08/19 1350 87     Resp 08/08/19 1350 18     Temp 08/08/19 1350 98.6 F (37 C)     Temp src --      SpO2 08/08/19 1350 100 %     Weight --      Height --      Head Circumference --      Peak Flow --      Pain Score 08/08/19 1351 0     Pain Loc --      Pain Edu? --      Excl. in [place]? --    No data found.  Updated Vital Signs BP 122/76   Pulse 87   Temp 98.6 F (37 C)   Resp 18   LMP 07/29/2012 (Approximate)   SpO2 100%    Physical Exam Constitutional:      General: She is not in acute distress.    Appearance: She is well-developed.  HENT:     Mouth/Throat:     Mouth: Mucous membranes are moist.     Pharynx: No posterior oropharyngeal erythema.  Eyes:     Conjunctiva/sclera: Conjunctivae normal.     Pupils: Pupils are equal, round, and reactive to light.     Comments: No obvious swelling or redness   Cardiovascular:     Rate and Rhythm: Normal rate and regular rhythm.     Heart sounds: Normal heart sounds.  Pulmonary:     Effort: Pulmonary effort is normal.     Breath sounds: Normal breath sounds.  Skin:    General: Skin is warm and dry.     Findings: No rash.     Comments: No visible rash   Neurological:     Mental Status: She is alert and oriented to person, place, and time.      UC Treatments / Results  Labs (  all labs ordered are listed, but only abnormal results are displayed) Labs Reviewed - No data to display  EKG   Radiology No results found.  Procedures Procedures (including critical care time)  Medications Ordered in UC Medications - No data to display  Initial Impression / Assessment and Plan / UC Course  I have reviewed the triage vital signs and the nursing notes.  Pertinent labs & imaging results that were available during my care of the patient were reviewed by me and considered in my medical decision making (see chart for details).    Allergic response likely, no  further symptoms. Continue with antihistamines. Return precautions provided. Patient verbalized understanding and agreeable to plan.   Final Clinical Impressions(s) / UC Diagnoses   Final diagnoses:  Allergic reaction, initial encounter     Discharge Instructions     Continue with use of hydroxizine as needed for itching. Daily antihistamine as needed for hives or swelling.  If any worsening of symptoms- oral swelling, tongue swelling, difficulty swallowing or breathing- or otherwise worsening please return or go to the ER.    ED Prescriptions    None     Controlled Substance Prescriptions [place] Controlled Substance Registry consulted? Not Applicable   Georgetta HaberBurky, Kyrell Ruacho B, NP 08/08/19 1505

## 2019-08-08 NOTE — Discharge Instructions (Signed)
Continue with use of hydroxizine as needed for itching. Daily antihistamine as needed for hives or swelling.  If any worsening of symptoms- oral swelling, tongue swelling, difficulty swallowing or breathing- or otherwise worsening please return or go to the ER.

## 2019-08-14 ENCOUNTER — Other Ambulatory Visit: Payer: Self-pay

## 2019-08-14 ENCOUNTER — Ambulatory Visit (INDEPENDENT_AMBULATORY_CARE_PROVIDER_SITE_OTHER): Payer: Medicare Other

## 2019-08-14 DIAGNOSIS — Z20822 Contact with and (suspected) exposure to covid-19: Secondary | ICD-10-CM

## 2019-08-14 DIAGNOSIS — Z3202 Encounter for pregnancy test, result negative: Secondary | ICD-10-CM | POA: Diagnosis not present

## 2019-08-14 DIAGNOSIS — Z3200 Encounter for pregnancy test, result unknown: Secondary | ICD-10-CM

## 2019-08-14 DIAGNOSIS — R6889 Other general symptoms and signs: Secondary | ICD-10-CM | POA: Diagnosis not present

## 2019-08-14 LAB — POCT URINE PREGNANCY: Preg Test, Ur: NEGATIVE

## 2019-08-14 NOTE — Progress Notes (Signed)
Pt is here for UPT. UPT is negative, pt made aware.

## 2019-08-16 LAB — NOVEL CORONAVIRUS, NAA: SARS-CoV-2, NAA: NOT DETECTED

## 2019-08-18 ENCOUNTER — Telehealth: Payer: Self-pay | Admitting: Nurse Practitioner

## 2019-08-18 NOTE — Telephone Encounter (Signed)
Patient called in for the results of her Covid-19 test.  She was told that Covid was Not Detected. °

## 2019-09-06 ENCOUNTER — Other Ambulatory Visit: Payer: Self-pay | Admitting: Nurse Practitioner

## 2019-09-06 ENCOUNTER — Other Ambulatory Visit: Payer: Self-pay | Admitting: Neurology

## 2019-09-06 DIAGNOSIS — R21 Rash and other nonspecific skin eruption: Secondary | ICD-10-CM

## 2019-09-18 ENCOUNTER — Telehealth: Payer: Self-pay | Admitting: Nurse Practitioner

## 2019-09-18 NOTE — Chronic Care Management (AMB) (Signed)
Chronic Care Management  ° °Note ° °09/18/2019 °Name: Kellye Bick MRN: [id number] DOB: 10/31/1966 ° °Mihaela Haider is a 53 y.o. year old female who is a primary care patient of Moore, Janece, FNP. I reached out to Shamila Sickles by phone today in response to a referral sent by Ms. Donnisha Wedge's patient's health plan.    ° °Ms. Waltner was given information about Chronic Care Management services today including:  °1. CCM service includes personalized support from designated clinical staff supervised by her physician, including individualized plan of care and coordination with other care providers °2. 24/7 contact phone numbers for assistance for urgent and routine care needs. °3. Service will only be billed when office clinical staff spend 20 minutes or more in a month to coordinate care. °4. Only one practitioner may furnish and bill the service in a calendar month. °5. The patient may stop CCM services at any time (effective at the end of the month) by phone call to the office staff. °6. The patient will be responsible for cost sharing (co-pay) of up to 20% of the service fee (after annual deductible is met). ° °Patient agreed to services and verbal consent obtained.  ° °Follow up plan: °Telephone appointment with CCM team member scheduled for: 10/21/2019 ° °Bernice Cicero °Care Guide • [facility] °[place]   [facility]  °??[email or web address]   ??[phone]   ° ° ° °

## 2019-09-24 ENCOUNTER — Other Ambulatory Visit: Payer: Self-pay | Admitting: Allergy and Immunology

## 2019-09-24 ENCOUNTER — Other Ambulatory Visit: Payer: Self-pay | Admitting: Nurse Practitioner

## 2019-09-29 ENCOUNTER — Telehealth: Payer: Self-pay | Admitting: Neurology

## 2019-09-29 NOTE — Telephone Encounter (Signed)
I called patient and LVM to r/s 10/22 appt due to NP out. Requested patient call back to r/s.

## 2019-10-01 ENCOUNTER — Encounter: Payer: Self-pay | Admitting: Neurology

## 2019-10-01 NOTE — Telephone Encounter (Signed)
I have sent the patient a letter regarding rescheduling her appt.

## 2019-10-03 ENCOUNTER — Ambulatory Visit: Payer: Medicare Other

## 2019-10-06 ENCOUNTER — Other Ambulatory Visit: Payer: Self-pay | Admitting: Neurology

## 2019-10-07 ENCOUNTER — Telehealth: Payer: Self-pay | Admitting: Hematology & Oncology

## 2019-10-07 NOTE — Telephone Encounter (Signed)
Patient called on 10/13 to cancel her appt for 10/16.  She will call back to reschedule

## 2019-10-08 ENCOUNTER — Other Ambulatory Visit: Payer: Self-pay | Admitting: Neurology

## 2019-10-08 ENCOUNTER — Other Ambulatory Visit: Payer: Self-pay | Admitting: Nurse Practitioner

## 2019-10-08 DIAGNOSIS — R21 Rash and other nonspecific skin eruption: Secondary | ICD-10-CM

## 2019-10-10 ENCOUNTER — Ambulatory Visit: Payer: Medicare Other

## 2019-10-16 ENCOUNTER — Ambulatory Visit: Payer: Medicare Other | Admitting: Neurology

## 2019-10-21 ENCOUNTER — Telehealth: Payer: Medicare Other

## 2019-10-30 ENCOUNTER — Ambulatory Visit: Payer: Self-pay | Admitting: Neurology

## 2019-11-03 ENCOUNTER — Other Ambulatory Visit: Payer: Self-pay | Admitting: Allergy and Immunology

## 2019-11-04 ENCOUNTER — Ambulatory Visit: Payer: Self-pay | Admitting: Neurology

## 2019-11-04 ENCOUNTER — Other Ambulatory Visit: Payer: Self-pay | Admitting: Allergy and Immunology

## 2019-11-07 ENCOUNTER — Ambulatory Visit: Payer: Medicare Other

## 2019-11-10 ENCOUNTER — Inpatient Hospital Stay: Payer: Medicare Other | Attending: Hematology & Oncology

## 2019-11-10 ENCOUNTER — Other Ambulatory Visit: Payer: Self-pay

## 2019-11-10 VITALS — BP 136/69 | HR 74 | Temp 97.5°F | Resp 18

## 2019-11-10 DIAGNOSIS — D510 Vitamin B12 deficiency anemia due to intrinsic factor deficiency: Secondary | ICD-10-CM | POA: Diagnosis not present

## 2019-11-10 MED ORDER — CYANOCOBALAMIN 1000 MCG/ML IJ SOLN
1000.0000 ug | Freq: Once | INTRAMUSCULAR | Status: AC
  Administered 2019-11-10: 1000 ug via INTRAMUSCULAR

## 2019-11-10 MED ORDER — CYANOCOBALAMIN 1000 MCG/ML IJ SOLN
INTRAMUSCULAR | Status: AC
  Filled 2019-11-10: qty 1

## 2019-11-10 NOTE — Patient Instructions (Signed)

## 2019-11-18 NOTE — Progress Notes (Deleted)
PATIENT: Sherri SlimmerSamantha [name] DOB: 1966-11-18  REASON FOR VISIT: follow up HISTORY FROM: patient  HISTORY OF PRESENT ILLNESS: Today 11/18/19  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  She remains on baclofen and diazepam.  HISTORY 04/15/2019 Dr. Anne HahnWillis: Sherri SlimmerSamantha [name] is a 53 year old right-handed black female with a history of stiff person syndrome.  The patient is on baclofen and diazepam, she is having some drowsiness on the medication but not severely.  She has done fairly well, she is able to exercise on a regular basis.  She sometimes will use a cane for ambulation.  She has borderline diabetes, her last hemoglobin A1c was 5.8.  The patient has not had any falls since last seen.  She does have some slight gait instability at times.  She claims that she requires a handicap placard for her car.  The patient has not noted any significant new medical problems since last seen.  She is not currently working.  She sleeps fairly well in the evening hours.  REVIEW OF SYSTEMS: Out of a complete 14 system review of symptoms, the patient complains only of the following symptoms, and all other reviewed systems are negative.  ALLERGIES: Allergies  Allergen Reactions  . Ibuprofen Other (See Comments)    Does not take due to hx of renal insufficiency "I have kidney disease"   . Lemon Flavor Swelling    Severe Lip Swelling  FRUIT per pt.    . Amoxicillin Diarrhea and Other (See Comments)  . Tylenol [Acetaminophen] Hives    Cannot take large quantities    HOME MEDICATIONS: Outpatient Medications Prior to Visit  Medication Sig Dispense Refill  . albuterol (VENTOLIN HFA) 108 (90 Base) MCG/ACT inhaler INHALE 2 PUFFS INTO THE LUNGS EVERY 6 HOURS AS NEEDED 18 g 0  . Ascorbic Acid (VITAMIN C) 1000 MG tablet Take 1,000 mg by mouth daily.    . baclofen (LIORESAL) 20 MG tablet TAKE 1 AND 1/2 TABLETS BY  MOUTH IN THE MORNING AND IN THE EVENING AND 1 TABLET AT MIDDAY 360  tablet 3  . cetirizine (ZYRTEC) 10 MG tablet TAKE 1 TABLET BY MOUTH EVERY DAY 30 tablet 2  . Cholecalciferol (VITAMIN D3) 5000 units CAPS Take by mouth.    . Cranberry-Vitamin C-Vitamin E (CRANBERRY PLUS VITAMIN C) 4200-20-3 MG-MG-UNIT CAPS Take by mouth.    . cyanocobalamin (,VITAMIN B-12,) 1000 MCG/ML injection Inject 1,000 mcg into the muscle every 3 (three) months.     . desloratadine (CLARINEX) 5 MG tablet TAKE 1 TABLET BY MOUTH ONCE DAILY 90 tablet 1  . diazepam (VALIUM) 10 MG tablet Take 2 tablets (20 mg total) by mouth 3 (three) times daily. 540 tablet 1  . diphenhydrAMINE (BENADRYL) 25 MG tablet Take 25 mg by mouth at bedtime as needed for itching or allergies.     [name]. EPINEPHRINE 0.3 mg/0.3 mL IJ SOAJ injection INJECT INTRAMUSCULARLY  0.3ML AS NEEDED FOR  ANAPHYLAXIS . SEEK MEDICAL  ATTENTION AFTER USE. 2 each 0  . famotidine (PEPCID) 20 MG tablet TAKE 1 TABLET BY MOUTH TWICE A DAY 60 tablet 0  . famotidine (PEPCID) 40 MG tablet Take 75 mg by mouth daily.    [name]. FLUoxetine (PROZAC) 10 MG capsule TAKE 1 CAPSULE BY MOUTH  DAILY 90 capsule 0  . gabapentin (NEURONTIN) 300 MG capsule TAKE 1 CAPSULE BY MOUTH TWO TIMES DAILY 180 capsule 3  . Glucose Blood (ACCU-CHEK SIMPLICITY TEST STRP VI) by In Vitro route. Insert  1 by subcutaneous route 4 times every day check blood sugar before breakfast, lunch and dinner and bedtime    . hydrOXYzine (VISTARIL) 100 MG capsule TAKE 1 CAPSULE BY MOUTH 3  TIMES DAILY AS NEEDED FOR  ITCHING 30 capsule 0  . levocetirizine (XYZAL) 5 MG tablet Take 1 tablet (5 mg total) by mouth every evening. 30 tablet 5  . metFORMIN (GLUCOPHAGE) 500 MG tablet TAKE 1 TABLET BY MOUTH TWICE A DAY WITH MORNING AND EVENING MEALS 180 tablet 1  . metroNIDAZOLE (FLAGYL) 500 MG tablet Take 1 tablet (500 mg total) by mouth 2 (two) times daily. 14 tablet 0  . Olopatadine HCl (PAZEO) 0.7 % SOLN Apply 1 drop to eye daily.    [name] sulfamethoxazole-trimethoprim (BACTRIM DS) 800-160 MG tablet Take 1  tablet by mouth 2 (two) times daily. 14 tablet 1  . tinidazole (TINDAMAX) 500 MG tablet Take 2 tablets (1,000 mg total) by mouth daily with breakfast. 10 tablet 2  . vitamin E (VITAMIN E) 400 UNIT capsule Take 400 Units by mouth daily.     No facility-administered medications prior to visit.     PAST MEDICAL HISTORY: Past Medical History:  Diagnosis Date  . Angioedema   . Anxiety   . Arthritis   . Asthma   . CAP (community acquired pneumonia) 08/29/2012  . Chronic back pain   . Chronic kidney disease   . Cocaine abuse (HCC)   . Depression   . Diarrhea   . DVT (deep venous thrombosis) (HCC)   . Environmental allergies   . Fall   . GERD (gastroesophageal reflux disease)   . Heart murmur    has been told once that she has a heart murmur, but has never had any problems  . Hiatal hernia 03/21/2013  . Lumbar herniated disc   . Peripheral vascular disease (HCC)   . Pernicious anemia 10/30/2011  . Postconcussion syndrome 12/30/2014  . Seasonal allergies   . Stiff person syndrome   . Urticaria     PAST SURGICAL HISTORY: Past Surgical History:  Procedure Laterality Date  . BUNIONECTOMY Left   . colonoscopy  07/09/15  . inguinal hernia 1983  1983  . OPEN REDUCTION INTERNAL FIXATION (ORIF) DISTAL RADIAL FRACTURE Right 08/16/2016   Procedure: OPEN REDUCTION INTERNAL FIXATION (ORIF) DISTAL RADIAL FRACTURE;  Surgeon: Eldred Manges, MD;  Location: [facility];  Service: Orthopedics;  Laterality: Right;    FAMILY HISTORY: Family History  Problem Relation Age of Onset  . Cancer Mother   . Other Mother   . COPD Father   . Asthma Brother   . Cancer Brother        colon  . Heart attack Brother   . Seizures Brother   . Allergic rhinitis Neg Hx   . Angioedema Neg Hx   . Eczema Neg Hx   . Immunodeficiency Neg Hx   . Urticaria Neg Hx     SOCIAL HISTORY: Social History   Socioeconomic History  . Marital status: Divorced    Spouse name: Not on file  . Number of children: 1  . Years of  education: college  . Highest education level: Not on file  Occupational History  . Occupation: disabled  Social Needs  . Financial resource strain: Not hard at all  . Food insecurity    Worry: Never true    Inability: Never true  . Transportation needs    Medical: No    Non-medical: No  Tobacco Use  . Smoking  status: Never Smoker  . Smokeless tobacco: Never Used  . Tobacco comment: Never Used Tobacco  Substance and Sexual Activity  . Alcohol use: Yes    Alcohol/week: 0.0 standard drinks    Comment: Occasional  . Drug use: Not Currently  . Sexual activity: Not Currently    Birth control/protection: Condom  Lifestyle  . Physical activity    Days per week: 2 days    Minutes per session: 30 min  . Stress: Not at all  Relationships  . Social Herbalist on phone: Not on file    Gets together: Not on file    Attends religious service: Not on file    Active member of club or organization: Not on file    Attends meetings of clubs or organizations: Not on file    Relationship status: Not on file  . Intimate partner violence    Fear of current or ex partner: No    Emotionally abused: No    Physically abused: No    Forced sexual activity: No  Other Topics Concern  . Not on file  Social History Narrative   Patient is right and left handed.   Patient drinks some caffeine occasionally.      PHYSICAL EXAM  There were no vitals filed for this visit. There is no height or weight on file to calculate BMI.  Generalized: Well developed, in no acute distress   Neurological examination  Mentation: Alert oriented to time, place, history taking. Follows all commands speech and language fluent Cranial nerve II-XII: Pupils were equal round reactive to light. Extraocular movements were full, visual field were full on confrontational test. Facial sensation and strength were normal. Uvula tongue midline. Head turning and shoulder shrug  were normal and symmetric. Motor: The  motor testing reveals 5 over 5 strength of all 4 extremities. Good symmetric motor tone is noted throughout.  Sensory: Sensory testing is intact to soft touch on all 4 extremities. No evidence of extinction is noted.  Coordination: Cerebellar testing reveals good finger-nose-finger and heel-to-shin bilaterally.  Gait and station: Gait is normal. Tandem gait is normal. Romberg is negative. No drift is seen.  Reflexes: Deep tendon reflexes are symmetric and normal bilaterally.   DIAGNOSTIC DATA (LABS, IMAGING, TESTING) - I reviewed patient records, labs, notes, testing and imaging myself where available.  Lab Results  Component Value Date   WBC 5.0 07/04/2019   HGB 11.4 (L) 07/04/2019   HCT 35.7 (L) 07/04/2019   MCV 88.4 07/04/2019   PLT 275 07/04/2019      Component Value Date/Time   NA 145 07/04/2019 1309   NA 145 (H) 04/24/2019 1227   NA 136 11/23/2017 1147   NA 144 03/22/2017 0943   K 4.0 07/04/2019 1309   K 3.9 11/23/2017 1147   K 4.2 03/22/2017 0943   CL 108 07/04/2019 1309   CL 94 (L) 11/23/2017 1147   CO2 29 07/04/2019 1309   CO2 30 11/23/2017 1147   CO2 29 03/22/2017 0943   GLUCOSE 110 (H) 07/04/2019 1309   GLUCOSE 531 (H) 11/23/2017 1147   BUN 14 07/04/2019 1309   BUN 14 04/24/2019 1227   BUN 10 11/23/2017 1147   BUN 11.1 03/22/2017 0943   CREATININE 1.01 (H) 07/04/2019 1309   CREATININE 1.0 11/23/2017 1147   CREATININE 1.0 03/22/2017 0943   CALCIUM 9.2 07/04/2019 1309   CALCIUM 10.1 11/23/2017 1147   CALCIUM 9.9 03/22/2017 0943   PROT 7.0 07/04/2019 1309  PROT 7.2 04/24/2019 1227   PROT 8.3 (H) 11/23/2017 1147   PROT 7.1 03/22/2017 0943   ALBUMIN 4.1 07/04/2019 1309   ALBUMIN 4.7 04/24/2019 1227   ALBUMIN 3.8 03/22/2017 0943   AST 11 (L) 07/04/2019 1309   AST 18 03/22/2017 0943   ALT 12 07/04/2019 1309   ALT 46 11/23/2017 1147   ALT 23 03/22/2017 0943   ALKPHOS 62 07/04/2019 1309   ALKPHOS 174 (H) 11/23/2017 1147   ALKPHOS 114 03/22/2017 0943    BILITOT 0.3 07/04/2019 1309   BILITOT 0.24 03/22/2017 0943   GFRNONAA >60 07/04/2019 1309   GFRAA >60 07/04/2019 1309   No results found for: CHOL, HDL, LDLCALC, LDLDIRECT, TRIG, CHOLHDL Lab Results  Component Value Date   HGBA1C 5.9 (H) 04/23/2019   Lab Results  Component Value Date   VITAMINB12 209 07/04/2019   Lab Results  Component Value Date   TSH 1.176 12/14/2015      ASSESSMENT AND PLAN 53 y.o. year old female  has a past medical history of Angioedema, Anxiety, Arthritis, Asthma, CAP (community acquired pneumonia) (08/29/2012), Chronic back pain, Chronic kidney disease, Cocaine abuse (HCC), Depression, Diarrhea, DVT (deep venous thrombosis) (HCC), Environmental allergies, Fall, GERD (gastroesophageal reflux disease), Heart murmur, Hiatal hernia (03/21/2013), Lumbar herniated disc, Peripheral vascular disease (HCC), Pernicious anemia (10/30/2011), Postconcussion syndrome (12/30/2014), Seasonal allergies, Stiff person syndrome, and Urticaria. here with ***   I spent 15 minutes with the patient. 50% of this time was spent   Margie Ege, [place], DNP 11/18/2019, 9:17 PM [facility] [address] [place], [place] [address] [phone]

## 2019-11-19 ENCOUNTER — Ambulatory Visit: Payer: Medicare Other | Admitting: Neurology

## 2019-11-24 ENCOUNTER — Other Ambulatory Visit: Payer: Self-pay

## 2019-11-24 ENCOUNTER — Telehealth: Payer: Self-pay | Admitting: Neurology

## 2019-11-24 ENCOUNTER — Other Ambulatory Visit: Payer: Self-pay | Admitting: Allergy and Immunology

## 2019-11-24 MED ORDER — DIAZEPAM 10 MG PO TABS: 20.0000 mg | ORAL_TABLET | Freq: Three times a day (TID) | ORAL | 0 refills | Status: DC

## 2019-11-24 NOTE — Telephone Encounter (Signed)
Refill sent for 30 days by work in [place] until pt sees Judson Roch NP in December 2020.

## 2019-11-24 NOTE — Telephone Encounter (Signed)
Pt is asking for a refill on her diazepam (VALIUM) 10 MG tablet [facility]

## 2019-12-03 ENCOUNTER — Other Ambulatory Visit: Payer: Self-pay | Admitting: Allergy and Immunology

## 2019-12-03 ENCOUNTER — Other Ambulatory Visit: Payer: Self-pay

## 2019-12-03 DIAGNOSIS — Z20822 Contact with and (suspected) exposure to covid-19: Secondary | ICD-10-CM

## 2019-12-03 DIAGNOSIS — Z20828 Contact with and (suspected) exposure to other viral communicable diseases: Secondary | ICD-10-CM | POA: Diagnosis not present

## 2019-12-04 LAB — NOVEL CORONAVIRUS, NAA: SARS-CoV-2, NAA: DETECTED — AB

## 2019-12-06 ENCOUNTER — Telehealth: Payer: Self-pay | Admitting: Unknown Physician Specialty

## 2019-12-06 NOTE — Telephone Encounter (Signed)
Discussed with patient about Covid symptoms and the use of bamlanivimab, a monoclonal antibody infusion for those with mild to moderate Covid symptoms and at a high risk of hospitalization.  Pt is not qualified for this infusion at the [facility] due to no symptoms  

## 2019-12-07 ENCOUNTER — Telehealth: Payer: Self-pay | Admitting: Unknown Physician Specialty

## 2019-12-07 ENCOUNTER — Other Ambulatory Visit: Payer: Self-pay | Admitting: Unknown Physician Specialty

## 2019-12-07 DIAGNOSIS — U071 COVID-19: Secondary | ICD-10-CM

## 2019-12-07 NOTE — Telephone Encounter (Signed)
  I connected by phone with Sherri [name] on 12/07/2019 at 9:13 PM to discuss the potential use of an new treatment for mild to moderate COVID-19 viral infection in non-hospitalized patients.  This patient is a 53 y.o. female that meets the FDA criteria for Emergency Use Authorization of bamlanivimab or casirivimab\imdevimab.  Has a (+) direct SARS-CoV-2 viral test result  Has mild or moderate COVID-19   Is ? 55 years of age and weighs ? 40 kg  Is NOT hospitalized due to COVID-19  Is NOT requiring oxygen therapy or requiring an increase in baseline oxygen flow rate due to COVID-19  Is within 10 days of symptom onset  Has at least one of the high risk factor(s) for progression to severe COVID-19 and/or hospitalization as defined in EUA.  Specific high risk criteria : Diabetes   I have spoken and communicated the following to the patient or parent/caregiver:  1. FDA has authorized the emergency use of bamlanivimab and casirivimab\imdevimab for the treatment of mild to moderate COVID-19 in adults and pediatric patients with positive results of direct SARS-CoV-2 viral testing who are 55 years of age and older weighing at least 40 kg, and who are at high risk for progressing to severe COVID-19 and/or hospitalization.  2. The significant known and potential risks and benefits of bamlanivimab and casirivimab\imdevimab, and the extent to which such potential risks and benefits are unknown.  3. Information on available alternative treatments and the risks and benefits of those alternatives, including clinical trials.  4. Patients treated with bamlanivimab and casirivimab\imdevimab should continue to self-isolate and use infection control measures (e.g., wear mask, isolate, social distance, avoid sharing personal items, clean and disinfect "high touch" surfaces, and frequent handwashing) according to CDC guidelines.   5. The patient or parent/caregiver has the option to accept or refuse  bamlanivimab or casirivimab\imdevimab .  After reviewing this information with the patient, The patient agreed to proceed with receiving the casirivimab\imdevimab infusion and will be provided a copy of the Fact sheet prior to receiving the infusion.Sherri [name] 12/07/2019 9:13 PM

## 2019-12-09 ENCOUNTER — Ambulatory Visit (HOSPITAL_COMMUNITY)
Admission: RE | Admit: 2019-12-09 | Discharge: 2019-12-09 | Disposition: A | Discharge status: Home / Self Care | Payer: Medicare Other | Source: Ambulatory Visit | Attending: Pulmonary Disease | Admitting: Pulmonary Disease

## 2019-12-09 DIAGNOSIS — Z23 Encounter for immunization: Secondary | ICD-10-CM | POA: Insufficient documentation

## 2019-12-09 DIAGNOSIS — U071 COVID-19: Secondary | ICD-10-CM | POA: Diagnosis not present

## 2019-12-09 MED ORDER — SODIUM CHLORIDE 0.9 % IV SOLN
Freq: Once | INTRAVENOUS | Status: AC
  Filled 2019-12-09: qty 10

## 2019-12-09 MED ORDER — FAMOTIDINE IN NACL 20-0.9 MG/50ML-% IV SOLN: 20.0000 mg | Freq: Once | INTRAVENOUS | Status: DC | PRN

## 2019-12-09 MED ORDER — DIPHENHYDRAMINE HCL 50 MG/ML IJ SOLN: 50.0000 mg | Freq: Once | INTRAMUSCULAR | Status: DC | PRN

## 2019-12-09 MED ORDER — ALBUTEROL SULFATE HFA 108 (90 BASE) MCG/ACT IN AERS: 2.0000 | INHALATION_SPRAY | Freq: Once | RESPIRATORY_TRACT | Status: DC | PRN

## 2019-12-09 MED ORDER — METHYLPREDNISOLONE SODIUM SUCC 125 MG IJ SOLR: 125.0000 mg | Freq: Once | INTRAMUSCULAR | Status: DC | PRN

## 2019-12-09 MED ORDER — EPINEPHRINE 0.3 MG/0.3ML IJ SOAJ: 0.3000 mg | Freq: Once | INTRAMUSCULAR | Status: DC | PRN

## 2019-12-09 MED ORDER — SODIUM CHLORIDE 0.9 % IV SOLN
INTRAVENOUS | Status: DC | PRN
  Administered 2019-12-09: 250 mL via INTRAVENOUS

## 2019-12-09 NOTE — Progress Notes (Signed)
  Diagnosis: COVID-19  Physician: Dr. Patrick Wright Procedure: [facility] Med: casirivimab\imdevimab infusion - Provided patient with casirivimab\imdevimab fact sheet for patients, parents and caregivers prior to infusion.  Complications: No immediate complications noted.  Discharge: Discharged home   Ally Yow 12/09/2019   

## 2019-12-10 ENCOUNTER — Telehealth (INDEPENDENT_AMBULATORY_CARE_PROVIDER_SITE_OTHER): Payer: Medicare Other | Admitting: Nurse Practitioner

## 2019-12-10 ENCOUNTER — Encounter: Payer: Self-pay | Admitting: Nurse Practitioner

## 2019-12-10 ENCOUNTER — Other Ambulatory Visit: Payer: Self-pay

## 2019-12-10 VITALS — Wt 108.0 lb

## 2019-12-10 DIAGNOSIS — F329 Major depressive disorder, single episode, unspecified: Secondary | ICD-10-CM | POA: Diagnosis not present

## 2019-12-10 DIAGNOSIS — U071 COVID-19: Secondary | ICD-10-CM

## 2019-12-10 DIAGNOSIS — F32A Depression, unspecified: Secondary | ICD-10-CM

## 2019-12-10 DIAGNOSIS — E118 Type 2 diabetes mellitus with unspecified complications: Secondary | ICD-10-CM | POA: Diagnosis not present

## 2019-12-10 NOTE — Progress Notes (Signed)
Virtual Visit via Video   This visit type was conducted due to national recommendations for restrictions regarding the COVID-19 Pandemic (e.g. social distancing) in an effort to limit this patient's exposure and mitigate transmission in our community.  Due to her co-morbid illnesses, this patient is at least at moderate risk for complications without adequate follow up.  This format is felt to be most appropriate for this patient at this time.  All issues noted in this document were discussed and addressed.  A limited physical exam was performed with this format.    This visit type was conducted due to national recommendations for restrictions regarding the COVID-19 Pandemic (e.g. social distancing) in an effort to limit this patient's exposure and mitigate transmission in our community.  Patients identity confirmed using two different identifiers.  This format is felt to be most appropriate for this patient at this time.  All issues noted in this document were discussed and addressed.  No physical exam was performed (except for noted visual exam findings with Video Visits).    Date:  12/14/2019   ID:  Laurena SlimmerSamantha Masci, DOB 1966/11/11, MRN [id number]  Patient Location:  Home - spoke with Laurena SlimmerSamantha Kuhlmann  Provider location:   Office    Chief Complaint:  covid positive and treated with infusion.   History of Present Illness:    Laurena SlimmerSamantha Dalpe is a 53 y.o. female who presents via video conferencing for a telehealth visit today.    The patient does not have symptoms concerning for COVID-19 infection (fever, chills, cough, or new shortness of breath).   She took covid test initially in October when she was around a Clinical cytogeneticisttax preparer.  She took test on December 9th after realizing from her tax preparer was diagnosed with covid.  She reports her only symptoms was some fatigue and stiffness. She was given an infusion of the covid antibodies outpatient yesterday.  She feels more tired today as well.   She does not have any other symptoms.   She is stiff.  Denies shortness of breath.        Past Medical History:  Diagnosis Date  . Angioedema   . Anxiety   . Arthritis   . Asthma   . CAP (community acquired pneumonia) 08/29/2012  . Chronic back pain   . Chronic kidney disease   . Cocaine abuse (HCC)   . Depression   . Diarrhea   . DVT (deep venous thrombosis) (HCC)   . Environmental allergies   . Fall   . GERD (gastroesophageal reflux disease)   . Heart murmur    has been told once that she has a heart murmur, but has never had any problems  . Hiatal hernia 03/21/2013  . Lumbar herniated disc   . Peripheral vascular disease (HCC)   . Pernicious anemia 10/30/2011  . Postconcussion syndrome 12/30/2014  . Seasonal allergies   . Stiff person syndrome   . Urticaria    Past Surgical History:  Procedure Laterality Date  . BUNIONECTOMY Left   . colonoscopy  07/09/15  . inguinal hernia 1983  1983  . OPEN REDUCTION INTERNAL FIXATION (ORIF) DISTAL RADIAL FRACTURE Right 08/16/2016   Procedure: OPEN REDUCTION INTERNAL FIXATION (ORIF) DISTAL RADIAL FRACTURE;  Surgeon: Eldred MangesMark C Yates, MD;  Location: [facility] OR;  Service: Orthopedics;  Laterality: Right;     Current Meds  Medication Sig  . albuterol (VENTOLIN HFA) 108 (90 Base) MCG/ACT inhaler INHALE 2 PUFFS INTO THE LUNGS EVERY 6 HOURS AS NEEDED  .  Ascorbic Acid (VITAMIN C) 1000 MG tablet Take 1,000 mg by mouth daily.  . baclofen (LIORESAL) 20 MG tablet TAKE 1 AND 1/2 TABLETS BY  MOUTH IN THE MORNING AND IN THE EVENING AND 1 TABLET AT MIDDAY  . cetirizine (ZYRTEC) 10 MG tablet TAKE 1 TABLET BY MOUTH EVERY DAY  . Cholecalciferol (VITAMIN D3) 5000 units CAPS Take by mouth.  . Cranberry-Vitamin C-Vitamin E (CRANBERRY PLUS VITAMIN C) 4200-20-3 MG-MG-UNIT CAPS Take by mouth.  . cyanocobalamin (,VITAMIN B-12,) 1000 MCG/ML injection Inject 1,000 mcg into the muscle every 3 (three) months.   . desloratadine (CLARINEX) 5 MG tablet TAKE 1 TABLET BY MOUTH  ONCE DAILY  . diazepam (VALIUM) 10 MG tablet Take 2 tablets (20 mg total) by mouth 3 (three) times daily.  . diphenhydrAMINE (BENADRYL) 25 MG tablet Take 25 mg by mouth at bedtime as needed for itching or allergies.   [name] EPINEPHRINE 0.3 mg/0.3 mL IJ SOAJ injection INJECT INTRAMUSCULARLY  0.3ML AS NEEDED FOR  ANAPHYLAXIS . SEEK MEDICAL  ATTENTION AFTER USE.  . famotidine (PEPCID) 20 MG tablet TAKE 1 TABLET BY MOUTH TWICE A DAY  . FLUoxetine (PROZAC) 10 MG capsule TAKE 1 CAPSULE BY MOUTH  DAILY  . gabapentin (NEURONTIN) 300 MG capsule TAKE 1 CAPSULE BY MOUTH TWO TIMES DAILY  . Glucose Blood (ACCU-CHEK SIMPLICITY TEST STRP VI) by In Vitro route. Insert 1 by subcutaneous route 4 times every day check blood sugar before breakfast, lunch and dinner and bedtime  . hydrOXYzine (VISTARIL) 100 MG capsule TAKE 1 CAPSULE BY MOUTH 3  TIMES DAILY AS NEEDED FOR  ITCHING  . metFORMIN (GLUCOPHAGE) 500 MG tablet TAKE 1 TABLET BY MOUTH TWICE A DAY WITH MORNING AND EVENING MEALS  . Olopatadine HCl (PAZEO) 0.7 % SOLN Apply 1 drop to eye daily.  . vitamin E (VITAMIN E) 400 UNIT capsule Take 400 Units by mouth daily.     Allergies:   Ibuprofen, Lemon flavor, Amoxicillin, and Tylenol [acetaminophen]   Social History   Tobacco Use  . Smoking status: Never Smoker  . Smokeless tobacco: Never Used  . Tobacco comment: Never Used Tobacco  Substance Use Topics  . Alcohol use: Yes    Alcohol/week: 0.0 standard drinks    Comment: Occasional  . Drug use: Not Currently     Family Hx: The patient's family history includes Asthma in her brother; COPD in her father; Cancer in her brother and mother; Heart attack in her brother; Other in her mother; Seizures in her brother. There is no history of Allergic rhinitis, Angioedema, Eczema, Immunodeficiency, or Urticaria.  ROS:   Please see the history of present illness.    Review of Systems  Constitutional: Positive for malaise/fatigue.  Respiratory: Negative.  Negative for  cough.   Cardiovascular: Negative.   Neurological: Negative.  Negative for dizziness and tingling.  Psychiatric/Behavioral: Negative.     All other systems reviewed and are negative.   Labs/Other Tests and Data Reviewed:    Recent Labs: 07/04/2019: ALT 12; BUN 14; Creatinine 1.01; Hemoglobin 11.4; Platelet Count 275; Potassium 4.0; Sodium 145   Recent Lipid Panel No results found for: CHOL, TRIG, HDL, CHOLHDL, LDLCALC, LDLDIRECT  Wt Readings from Last 3 Encounters:  12/10/19 108 lb 0.4 oz (49 kg)  07/07/19 108 lb (49 kg)  07/04/19 104 lb 1.9 oz (47.2 kg)     Exam:    Vital Signs:  Wt 108 lb 0.4 oz (49 kg)   LMP 07/29/2012 (Approximate)   BMI 19.14  kg/m     Physical Exam  Constitutional: She is oriented to person, place, and time and well-developed, well-nourished, and in no distress. No distress.  Pulmonary/Chest: Effort normal. No respiratory distress.  Neurological: She is alert and oriented to person, place, and time.  Psychiatric: Mood, memory, affect and judgment normal.    ASSESSMENT & PLAN:    1. COVID-19  She was treated with infusion   She is fatigued after the infusion but no other symptoms.   If any shortness of breath go to ER for evaluation.   She is aware to remain quarantined for a total of 14 days  2. Controlled diabetes mellitus type 2 with complications, unspecified whether long term insulin use (HCC)  No current medications  Will check Hgb once she is out of quarantine and does not have any symptoms  3. Depression, unspecified depression type  Chronic, stable.   Continue current medications     COVID-19 Education: The signs and symptoms of COVID-19 were discussed with the patient and how to seek care for testing (follow up with PCP or arrange E-visit).  The importance of social distancing was discussed today.  Patient Risk:   After full review of this patients clinical status, I feel that they are at least moderate risk at this  time.  Time:   Today, I have spent 12 minutes/ seconds with the patient with telehealth technology discussing above diagnoses.     Medication Adjustments/Labs and Tests Ordered: Current medicines are reviewed at length with the patient today.  Concerns regarding medicines are outlined above.   Tests Ordered: No orders of the defined types were placed in this encounter.   Medication Changes: No orders of the defined types were placed in this encounter.   Disposition:  Follow up Labs in January 2021  Signed, Minette Brine, FNP

## 2019-12-14 ENCOUNTER — Encounter: Payer: Self-pay | Admitting: Nurse Practitioner

## 2019-12-17 ENCOUNTER — Other Ambulatory Visit: Payer: Self-pay | Admitting: Nurse Practitioner

## 2019-12-17 ENCOUNTER — Telehealth (INDEPENDENT_AMBULATORY_CARE_PROVIDER_SITE_OTHER): Payer: Medicare Other | Admitting: Neurology

## 2019-12-17 ENCOUNTER — Encounter: Payer: Self-pay | Admitting: Neurology

## 2019-12-17 ENCOUNTER — Other Ambulatory Visit: Payer: Self-pay

## 2019-12-17 ENCOUNTER — Encounter

## 2019-12-17 DIAGNOSIS — G2582 Stiff-man syndrome: Secondary | ICD-10-CM | POA: Diagnosis not present

## 2019-12-17 MED ORDER — FLUOXETINE HCL 10 MG PO CAPS: 10.0000 mg | ORAL_CAPSULE | Freq: Every day | ORAL | 1 refills | Status: DC

## 2019-12-17 NOTE — Progress Notes (Signed)
I have read the note, and I agree with the clinical assessment and plan.  Morna Flud K Jemma Rasp   

## 2019-12-17 NOTE — Progress Notes (Signed)
    Virtual Visit via Video Note  I connected with Sherri [name] on 12/17/19 at  9:45 AM EST by a video enabled telemedicine application and verified that I am speaking with the correct person using two identifiers.  Location: Patient: At her home  Provider: In the office    I discussed the limitations of evaluation and management by telemedicine and the availability of in person appointments. The patient expressed understanding and agreed to proceed.  History of Present Illness: 12/17/2019 SS: Sherri [name] is a 53 year old female with history of stiff person syndrome. She remains on baclofen and diazepam.  She is also prescribed gabapentin and Prozac from this office.  She tested positive for COVID-19 on 12/03/2019, this is her 14th day of quarantine.  She indicates her stiff person syndrome has been under good control with medications.  She says she is tolerating the medication without side effect.  She has not had any recent falls.  She called and reported falls in January and February, the thought of possibly reducing the dose of diazepam was considered.  She takes a cane with her when she goes out, but she does not have to use it.  She drives a car.  She is on disability, but she is considering getting a job.  She indicates her overall health has been well and that the medications are working great.   HISTORY 04/15/2019 Dr. Jannifer Franklin: Sherri [name] is a 53 year old right-handed black female with a history of stiff person syndrome.  The patient is on baclofen and diazepam, she is having some drowsiness on the medication but not severely.  She has done fairly well, she is able to exercise on a regular basis.  She sometimes will use a cane for ambulation.  She has borderline diabetes, her last hemoglobin A1c was 5.8.  The patient has not had any falls since last seen.  She does have some slight gait instability at times.  She claims that she requires a handicap placard for her car.  The patient has  not noted any significant new medical problems since last seen.  She is not currently working.  She sleeps fairly well in the evening hours.   Observations/Objective: Telephone visit, she couldn't sign on My Chart or Doxy.me, she is alert and oriented, answers questions appropriately, speech is clear and concise  Assessment and Plan: 1. Stiff person syndrome   Overall, her stiff person syndrome has continued to be stable and well managed with medications.  She is recovering from COVID-19, testing positive on 12/03/2019.  She will remain on diazepam, baclofen, gabapentin, and Prozac.  I will provide a refill of Prozac today, which she is taking for anxiety.  She has not had any falls, but if falls do tend to occur we may need to consider reducing the dose of diazepam.  Follow Up Instructions: 6 months, 06/22/2020 11:15   I discussed the assessment and treatment plan with the patient. The patient was provided an opportunity to ask questions and all were answered. The patient agreed with the plan and demonstrated an understanding of the instructions.   The patient was advised to call back or seek an in-person evaluation if the symptoms worsen or if the condition fails to improve as anticipated.  I provided 15 minutes of non-face-to-face time during this encounter.  Evangeline Dakin, DNP  [facility] [address], [place], [place] [address] [phone]

## 2019-12-21 ENCOUNTER — Other Ambulatory Visit: Payer: Self-pay | Admitting: Allergy and Immunology

## 2019-12-22 ENCOUNTER — Other Ambulatory Visit: Payer: Self-pay

## 2020-01-02 ENCOUNTER — Telehealth: Payer: Self-pay

## 2020-01-02 ENCOUNTER — Ambulatory Visit (INDEPENDENT_AMBULATORY_CARE_PROVIDER_SITE_OTHER): Payer: Medicare Other

## 2020-01-02 ENCOUNTER — Other Ambulatory Visit: Payer: Self-pay | Admitting: *Deleted

## 2020-01-02 DIAGNOSIS — E118 Type 2 diabetes mellitus with unspecified complications: Secondary | ICD-10-CM | POA: Diagnosis not present

## 2020-01-02 DIAGNOSIS — G2582 Stiff-man syndrome: Secondary | ICD-10-CM

## 2020-01-02 DIAGNOSIS — J45909 Unspecified asthma, uncomplicated: Secondary | ICD-10-CM

## 2020-01-02 DIAGNOSIS — D510 Vitamin B12 deficiency anemia due to intrinsic factor deficiency: Secondary | ICD-10-CM

## 2020-01-05 ENCOUNTER — Inpatient Hospital Stay: Payer: Medicare Other | Admitting: Hematology & Oncology

## 2020-01-05 ENCOUNTER — Inpatient Hospital Stay: Payer: Medicare Other

## 2020-01-06 NOTE — Patient Instructions (Addendum)
Visit Information  Goals Addressed      Patient Stated   . "Not to have anymore falls" (pt-stated)       Current Barriers:  [name] Knowledge Deficits related to disease process and Self Health management for Stiff-Person Syndrome  . 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 Hx of Falls secondary to neurological disorder  Nurse Case Manager Clinical Goal(s):  [name] Over the next 90 days, patient will work with the CCM team to address needs related to disease education and support of neurological impairment, specifically Stiff-Person Syndrome  CCM RN CM Interventions:  01/05/20 call completed with patient  . Evaluation of current treatment plan related to Stiff-Person Syndrome and patient's adherence to plan as established by provider. . Reviewed medications with patient and discussed patient is adhering to her prescribed medication regimen, reports taking all medications exactly as prescribed without missed doses, reviewed indication, frequency and dosage . Discussed plans with patient for ongoing care management follow up and provided patient with direct contact information for care management team . Provided patient with printed educational materials related to Fall prevention  Patient Self Care Activities:  . Self administers medications as prescribed . Attends all scheduled provider appointments . Calls pharmacy for medication refills . Performs ADL's independently . Performs IADL's independently . Calls provider office for new concerns or questions  Initial goal documentation     . "To keep my Asthma under good control" (pt-stated)       Current Barriers:  [name] Knowledge Deficits related to disease process and Self Health management of Asthma . Chronic Disease Management support and education needs related to Asthma, Stiff-Person Syndrome, Diabetes  Nurse Case Manager Clinical Goal(s):  [name] Over the next 90 days, patient  will work with the CCM team and PCP to address needs related to disease education and support of Asthma  CCM RN CM Interventions:  01/05/20 call completed with patient  . Evaluation of current treatment plan related to Asthma and patient's adherence to plan as established by provider. . Reviewed medications with patient and discussed patient is not currently taking a maintenance medication for her Asthma; discussed she is only using the Albuterol as needed; patient is satisfied with this treatment   . Discussed plans with patient for ongoing care management follow up and provided patient with direct contact information for care management team . Provided patient with printed educational materials related to Asthma management   Patient Self Care Activities:  . Self administers medications as prescribed . Attends all scheduled provider appointments . Calls pharmacy for medication refills . Performs ADL's independently . Performs IADL's independently . Calls provider office for new concerns or questions  Initial goal documentation     . "To keep my diabetes under good control" (pt-stated)       Current Barriers:  [name] Knowledge Deficits related to disease process and Self Health management of Asthma . Chronic Disease Management support and education needs related to Diabetes, Asthma, Stiff-Person Syndrome  Nurse Case Manager Clinical Goal(s):  [name] Over the next 90 days, patient will work with the CCM team and PCP to address needs related to disease education and support for Diabetes  CCM RN CM Interventions:  01/05/20 call completed with patient  . Evaluation of current treatment plan related to Diabetes and patient's adherence to plan as established by provider. . Provided education to patient re: target A1C <7.0 and how to achieve this goal; education provided re: daily  glycemic goal of 80-130 to help maintain/achieve lower A1C; educated patient on interventions to take with hypoglycemic  events; patient reports 1 low of 67, and 1 high in 300's but average is within goal; she randomly checks FBS  . Reviewed medications with patient and discussed patient is adhering to her prescribed txt plan with Metformin bid . Discussed plans with patient for ongoing care management follow up and provided patient with direct contact information for care management team . Provided patient with printed educational materials related to Diabetes Management with Meal Planning; Diabetes Home Safety Zone Tool; Know Your A1C; signs/symptoms of Hypo/hyperglycemia; Carb Choices; Carb Counting . Advised patient, providing education and rationale, to check cbg daily before meals and record, calling the CCM team and or PCP for findings outside established parameters.    Patient Self Care Activities:  . Self administers medications as prescribed . Attends all scheduled provider appointments . Calls pharmacy for medication refills . Performs ADL's independently . Performs IADL's independently . Calls provider office for new concerns or questions  Initial goal documentation        The patient verbalized understanding of instructions provided today and declined a print copy of patient instruction materials.   Telephone follow up appointment with care management team member scheduled for:  01/28/20  Delsa Sale, RN, BSN, CCM Care Management Coordinator [facility]  Direct Phone: [phone]

## 2020-01-06 NOTE — Chronic Care Management (AMB) (Signed)
Chronic Care Management   Initial Visit Note  01/05/2020 Name: Sharaine Delange MRN: [id number] DOB: 06-23-1966  Referred by: Arnette Felts, FNP Reason for referral : Chronic Care Management (INITIAL CCM RN CM Telephone Outreach )   Fredna Stricker is a 54 y.o. year old female who is a primary care patient of Arnette Felts, FNP. 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    SDOH (Social Determinants of Health) screening performed today: None. See Care Plan for related entries.   Placed initial outbound CCM RN CM call to patient to assess for CCM needs and a care plan was established.   Medications: Outpatient Encounter Medications as of 01/02/2020  Medication Sig Note  . albuterol (VENTOLIN HFA) 108 (90 Base) MCG/ACT inhaler INHALE 2 PUFFS INTO THE LUNGS EVERY 6 HOURS AS NEEDED   . Ascorbic Acid (VITAMIN C) 1000 MG tablet Take 1,000 mg by mouth daily.   . baclofen (LIORESAL) 20 MG tablet TAKE 1 AND 1/2 TABLETS BY  MOUTH IN THE MORNING AND IN THE EVENING AND 1 TABLET AT MIDDAY   . cetirizine (ZYRTEC) 10 MG tablet TAKE 1 TABLET BY MOUTH EVERY DAY   . Cholecalciferol (VITAMIN D3) 5000 units CAPS Take by mouth.   . Cranberry-Vitamin C-Vitamin E (CRANBERRY PLUS VITAMIN C) 4200-20-3 MG-MG-UNIT CAPS Take by mouth.   . cyanocobalamin (,VITAMIN B-12,) 1000 MCG/ML injection Inject 1,000 mcg into the muscle every 3 (three) months.    . desloratadine (CLARINEX) 5 MG tablet TAKE 1 TABLET BY MOUTH ONCE DAILY   . diazepam (VALIUM) 10 MG tablet Take 2 tablets (20 mg total) by mouth 3 (three) times daily.    [name] EPINEPHRINE 0.3 mg/0.3 mL IJ SOAJ injection INJECT INTRAMUSCULARLY  0.3ML AS NEEDED FOR  ANAPHYLAXIS . SEEK MEDICAL  ATTENTION AFTER USE.   . famotidine (PEPCID) 20 MG tablet TAKE 1 TABLET BY MOUTH TWICE A DAY   . FLUoxetine (PROZAC) 10 MG capsule Take 1 capsule (10 mg total) by mouth daily.   [name] gabapentin (NEURONTIN) 300 MG capsule TAKE 1 CAPSULE BY MOUTH TWO TIMES DAILY   . Glucose Blood (ACCU-CHEK SIMPLICITY TEST STRP VI) by In Vitro route. Insert 1 by subcutaneous route 4 times every day check blood sugar before breakfast, lunch and dinner and bedtime   . hydrOXYzine (VISTARIL) 100 MG capsule TAKE 1 CAPSULE BY MOUTH 3  TIMES DAILY AS NEEDED FOR  ITCHING   . metFORMIN (GLUCOPHAGE) 500 MG tablet TAKE 1 TABLET BY MOUTH TWICE A DAY WITH MORNING AND EVENING MEALS   . Olopatadine HCl (PAZEO) 0.7 % SOLN Apply 1 drop to eye as needed.    . vitamin E (VITAMIN E) 400 UNIT capsule Take 400 Units by mouth daily.   . diphenhydrAMINE (BENADRYL) 25 MG tablet Take 25 mg by mouth at bedtime as needed for itching or allergies.  01/17/2017: Takes as needed   No facility-administered encounter medications on file as of 01/02/2020.     Objective:  Lab Results  Component Value Date   HGBA1C 5.9 (H) 04/23/2019   HGBA1C 5.8 09/05/2018   HGBA1C 5.3 09/03/2012   Lab Results  Component Value Date   CREATININE 1.01 (  H) 07/04/2019   BP Readings from Last 3 Encounters:  12/09/19 109/71  11/10/19 136/69  08/08/19 122/76    Goals Addressed      Patient Stated   . "Not to have anymore falls" (pt-stated)       Current Barriers:  [name] Knowledge Deficits related to disease process and Self Health management for Stiff-Person Syndrome  . 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 Hx of Falls secondary to neurological disorder  Nurse Case Manager Clinical Goal(s):  [name] Over the next 90 days, patient will work with the CCM team to address needs  related to disease education and support of neurological impairment, specifically Stiff-Person Syndrome  CCM RN CM Interventions:  01/05/20 call completed with patient  . Evaluation of current treatment plan related to Stiff-Person Syndrome and patient's adherence to plan as established by provider. . Reviewed medications with patient and discussed patient is adhering to her prescribed medication regimen, reports taking all medications exactly as prescribed without missed doses, reviewed indication, frequency and dosage . Discussed plans with patient for ongoing care management follow up and provided patient with direct contact information for care management team . Provided patient with printed educational materials related to Fall prevention  Patient Self Care Activities:  . Self administers medications as prescribed . Attends all scheduled provider appointments . Calls pharmacy for medication refills . Performs ADL's independently . Performs IADL's independently . Calls provider office for new concerns or questions  Initial goal documentation     . "To keep my Asthma under good control" (pt-stated)       Current Barriers:  [name] Knowledge Deficits related to disease process and Self Health management of Asthma . 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 Evaluation of current treatment plan related to Asthma and patient's adherence to plan as established by provider. . Reviewed medications with patient and discussed patient is not currently taking a maintenance medication for her Asthma; discussed she is only using the Albuterol as needed; patient is satisfied with this treatment    . Discussed plans with patient for ongoing care management follow up and provided patient with direct contact information for care management team . Provided patient with printed educational materials related to Asthma management   Patient Self Care Activities:  . Self administers medications as prescribed . Attends all scheduled provider appointments . Calls pharmacy for medication refills . Performs ADL's independently . Performs IADL's independently . Calls provider office for new concerns or questions  Initial goal documentation     . "To keep my diabetes under good control" (pt-stated)       Current Barriers:  [name] Knowledge Deficits related to disease process and Self Health management of Asthma . Chronic Disease Management support and education needs related to Diabetes, Asthma, Stiff-Person Syndrome  Nurse Case Manager Clinical Goal(s):  [name] Over the next 90 days, patient will work with the CCM team and PCP to address needs related to disease education and support for Diabetes  CCM RN CM Interventions:  01/05/20 call completed with patient  . Evaluation of current treatment plan related to Diabetes and patient's adherence to plan as established by  provider. . Provided education to patient re: target A1C <7.0 and how to achieve this goal; education provided re: daily glycemic goal of 80-130 to help maintain/achieve lower A1C; educated patient on interventions to take with hypoglycemic events; patient reports 1 low of 67, and 1 high in 300's but average is within goal; she randomly checks FBS  . Reviewed medications with patient and discussed patient is adhering to her prescribed txt plan with Metformin bid . Discussed plans with patient for ongoing care management follow up and provided patient with direct contact information for care management team . Provided patient with printed educational materials related to Diabetes Management with Meal Planning; Diabetes Home Safety Zone Tool;  Know Your A1C; signs/symptoms of Hypo/hyperglycemia; Carb Choices; Carb Counting . Advised patient, providing education and rationale, to check cbg daily before meals and record, calling the CCM team and or PCP for findings outside established parameters.    Patient Self Care Activities:  . Self administers medications as prescribed . Attends all scheduled provider appointments . Calls pharmacy for medication refills . Performs ADL's independently . Performs IADL's independently . Calls provider office for new concerns or questions  Initial goal documentation         Plan:   Telephone follow up appointment with care management team member scheduled for:  01/28/20   Barb Merino, RN, BSN, CCM Care Management Coordinator [place] [facility]  Direct Phone: [phone]

## 2020-01-15 ENCOUNTER — Ambulatory Visit (INDEPENDENT_AMBULATORY_CARE_PROVIDER_SITE_OTHER): Payer: Medicare Other | Admitting: Nurse Practitioner

## 2020-01-15 ENCOUNTER — Other Ambulatory Visit: Payer: Self-pay

## 2020-01-15 ENCOUNTER — Encounter: Payer: Self-pay | Admitting: Nurse Practitioner

## 2020-01-15 VITALS — BP 114/70 | HR 56 | Temp 98.1°F | Wt 113.0 lb

## 2020-01-15 DIAGNOSIS — J45909 Unspecified asthma, uncomplicated: Secondary | ICD-10-CM

## 2020-01-15 DIAGNOSIS — E1165 Type 2 diabetes mellitus with hyperglycemia: Secondary | ICD-10-CM | POA: Diagnosis not present

## 2020-01-15 DIAGNOSIS — Z8616 Personal history of COVID-19: Secondary | ICD-10-CM | POA: Diagnosis not present

## 2020-01-15 DIAGNOSIS — E119 Type 2 diabetes mellitus without complications: Secondary | ICD-10-CM

## 2020-01-15 DIAGNOSIS — Z1231 Encounter for screening mammogram for malignant neoplasm of breast: Secondary | ICD-10-CM

## 2020-01-15 DIAGNOSIS — Z8709 Personal history of other diseases of the respiratory system: Secondary | ICD-10-CM | POA: Diagnosis not present

## 2020-01-15 MED ORDER — METFORMIN HCL 500 MG PO TABS: 1000.0000 mg | ORAL_TABLET | Freq: Every day | ORAL | 1 refills | Status: DC

## 2020-01-15 MED ORDER — ALBUTEROL SULFATE HFA 108 (90 BASE) MCG/ACT IN AERS: 2.0000 | INHALATION_SPRAY | Freq: Four times a day (QID) | RESPIRATORY_TRACT | 1 refills | Status: DC | PRN
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
## 2020-01-16 LAB — CMP14+EGFR
ALT: 8 IU/L (ref 0–32)
AST: 8 IU/L (ref 0–40)
Albumin/Globulin Ratio: 1.7 (ref 1.2–2.2)
Albumin: 4.6 g/dL (ref 3.8–4.9)
Alkaline Phosphatase: 89 IU/L (ref 39–117)
BUN/Creatinine Ratio: 12 (ref 9–23)
BUN: 13 mg/dL (ref 6–24)
Bilirubin Total: 0.2 mg/dL (ref 0.0–1.2)
CO2: 24 mmol/L (ref 20–29)
Calcium: 10.4 mg/dL — ABNORMAL HIGH (ref 8.7–10.2)
Chloride: 103 mmol/L (ref 96–106)
Creatinine, Ser: 1.13 mg/dL — ABNORMAL HIGH (ref 0.57–1.00)
GFR calc Af Amer: 63 mL/min/{1.73_m2} (ref >59)
GFR calc non Af Amer: 55 mL/min/{1.73_m2} — ABNORMAL LOW (ref >59)
Globulin, Total: 2.7 g/dL (ref 1.5–4.5)
Glucose: 143 mg/dL — ABNORMAL HIGH (ref 65–99)
Potassium: 4.4 mmol/L (ref 3.5–5.2)
Sodium: 141 mmol/L (ref 134–144)
Total Protein: 7.3 g/dL (ref 6.0–8.5)

## 2020-01-16 LAB — HEMOGLOBIN A1C
Est. average glucose Bld gHb Est-mCnc: 197 mg/dL
Hgb A1c MFr Bld: 8.5 % — ABNORMAL HIGH (ref 4.8–5.6)

## 2020-01-16 LAB — NOVEL CORONAVIRUS, NAA: SARS-CoV-2, NAA: NOT DETECTED

## 2020-01-21 ENCOUNTER — Telehealth: Payer: Self-pay | Admitting: Neurology

## 2020-01-21 ENCOUNTER — Other Ambulatory Visit: Payer: Self-pay

## 2020-01-21 ENCOUNTER — Other Ambulatory Visit: Payer: Self-pay | Admitting: Neurology

## 2020-01-21 DIAGNOSIS — R21 Rash and other nonspecific skin eruption: Secondary | ICD-10-CM

## 2020-01-21 MED ORDER — DESLORATADINE 5 MG PO TABS: 5.0000 mg | ORAL_TABLET | Freq: Every day | ORAL | 1 refills | Status: DC

## 2020-01-21 MED ORDER — HYDROXYZINE PAMOATE 100 MG PO CAPS: ORAL_CAPSULE | ORAL | 0 refills | Status: DC

## 2020-01-21 NOTE — Telephone Encounter (Signed)
Pt is asking if her diazepam (VALIUM) 10 MG tablet will be called in to both [id number]  And [facility] please call

## 2020-01-22 MED ORDER — DIAZEPAM 10 MG PO TABS: 20.0000 mg | ORAL_TABLET | Freq: Three times a day (TID) | ORAL | 3 refills | Status: DC

## 2020-01-22 NOTE — Telephone Encounter (Signed)
[facility] and last refill done by them for diazepam is 10-09-19.

## 2020-01-22 NOTE — Telephone Encounter (Signed)
LMVM for pt calling about question of valium to [employer] and local pharmacy.  ? Falls.

## 2020-01-22 NOTE — Telephone Encounter (Signed)
Pt returned call and she [id number] and will keep other refill as backup for if her mail order does not come in.  She states [facility] did not have refills and is needing new prescription for this 30 day supply.

## 2020-01-22 NOTE — Addendum Note (Signed)
Addended by: Glean Salvo on: 01/22/2020 02:17 PM   Modules accepted: Orders

## 2020-01-22 NOTE — Telephone Encounter (Signed)
Ok I will send a refill of diazepam to [facility] for 30 day supply, 3 refills, I just sent refill yesterday[facility].

## 2020-01-26 ENCOUNTER — Telehealth: Payer: Self-pay

## 2020-01-26 NOTE — Telephone Encounter (Signed)
Patient notified YRL,RMA 

## 2020-01-27 ENCOUNTER — Telehealth: Payer: Self-pay

## 2020-01-27 NOTE — Telephone Encounter (Signed)
-----   Message from Arnette Felts, FNP sent at 01/22/2020 11:05 PM EST ----- You are negative for covid.  Your kidney functions has increased.  Your HgbA1c has jumped up significantly to 8.5, you need to increase your metformin to 1000 mg twice a day, do you still have any glipizide at home? Stop eating junk food particularly at night.

## 2020-01-27 NOTE — Telephone Encounter (Signed)
1st attempt to give lab results  

## 2020-01-28 ENCOUNTER — Other Ambulatory Visit: Payer: Self-pay | Admitting: Nurse Practitioner

## 2020-01-28 ENCOUNTER — Other Ambulatory Visit: Payer: Self-pay | Admitting: Allergy and Immunology

## 2020-01-28 ENCOUNTER — Telehealth: Payer: Self-pay

## 2020-01-28 ENCOUNTER — Other Ambulatory Visit: Payer: Self-pay

## 2020-01-28 DIAGNOSIS — Z8709 Personal history of other diseases of the respiratory system: Secondary | ICD-10-CM

## 2020-01-29 ENCOUNTER — Telehealth: Payer: Self-pay

## 2020-01-29 NOTE — Telephone Encounter (Signed)
Left vm for pt to look at [facility]

## 2020-01-29 NOTE — Telephone Encounter (Signed)
-----   Message from Arnette Felts, FNP sent at 01/29/2020 11:18 AM EST ----- Have her to take 1 tablet of the glipizide daily as well and get the dose.  Thanks, make sure she is scheduled for follow up in 8-12 weeks.

## 2020-01-29 NOTE — Progress Notes (Signed)
Have her to take 1 tablet of the glipizide daily as well and get the dose.  Thanks, make sure she is scheduled for follow up in 8-12 weeks.

## 2020-01-30 ENCOUNTER — Other Ambulatory Visit: Payer: Self-pay

## 2020-02-06 ENCOUNTER — Telehealth: Payer: Medicare Other

## 2020-02-06 ENCOUNTER — Other Ambulatory Visit: Payer: Self-pay

## 2020-02-06 NOTE — Progress Notes (Signed)
This encounter was created in error - please disregard.

## 2020-02-09 ENCOUNTER — Other Ambulatory Visit: Payer: Self-pay

## 2020-02-09 ENCOUNTER — Other Ambulatory Visit: Payer: Self-pay | Admitting: Nurse Practitioner

## 2020-02-09 ENCOUNTER — Inpatient Hospital Stay: Payer: Medicare Other

## 2020-02-09 ENCOUNTER — Telehealth: Payer: Medicare Other

## 2020-02-09 ENCOUNTER — Inpatient Hospital Stay: Payer: Medicare Other | Attending: Hematology & Oncology

## 2020-02-09 ENCOUNTER — Inpatient Hospital Stay: Payer: Medicare Other | Admitting: Hematology & Oncology

## 2020-02-09 ENCOUNTER — Ambulatory Visit (INDEPENDENT_AMBULATORY_CARE_PROVIDER_SITE_OTHER): Payer: Medicare Other

## 2020-02-09 ENCOUNTER — Ambulatory Visit: Payer: Medicare Other

## 2020-02-09 ENCOUNTER — Telehealth: Payer: Self-pay

## 2020-02-09 DIAGNOSIS — E119 Type 2 diabetes mellitus without complications: Secondary | ICD-10-CM

## 2020-02-09 DIAGNOSIS — J45909 Unspecified asthma, uncomplicated: Secondary | ICD-10-CM

## 2020-02-09 DIAGNOSIS — G2582 Stiff-man syndrome: Secondary | ICD-10-CM
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
## 2020-02-09 NOTE — Chronic Care Management (AMB) (Signed)
Chronic Care Management   Social Work Note  02/09/2020 Name: Sherri [name] MRN: [id number] DOB: 09-Feb-1966  Sherri [name] is a 54 y.o. year old female who sees Minette Brine, [place] for primary care. The CCM team was consulted for assistance with care coordination.   SW placed a successful outbound call to the patient to conduct an SDOH (Social Determinants of Health) screen and assist with resource needs. Upon HIPAA screening, SW had to request patient state her date of birth x 3 due to patient inability to state entire date first two tries. The patient was notably slurring words and sounded lethargic. SW assessed for patient blood glucose from the morning reading, patient reported she had not taken her medication or checked her blood sugar reading. SW requested the patient check her sugar level while on the phone with this Probation officer, patient stated "I can't do that". SW attempted to contact RN Case Manager whom was unavailable at the time. SW advised the patient SW was worried about her medical condition due to word slurring, lethargy, and inability to report glucose reading. SW informed the patient SW would contact 911 to visit the patients home.  SW dialed 911 from personal cell while keeping patient on the phone. When paramedics arrived patient vitals were checked.   BP 92/58 Pulse 90 BS 368 O2 SAT 99  Patient reports she has not taken her Metformin today. Patient was able to answer orientation questions including address, day of the week, year, and president. Patient refused medical treatment and transport to the hospital. SW spoke with paramedic Larkin Ina who indicated concern over patients vitals but inability to force medical treatment. Larkin Ina informed the patient he felt she needed medical treatment, patient refused and signed a waiver stating she refused care. The patient was advised by the paramedic to take DM medication and increase water intake.  SW informed the patient, this Probation officer would  inform primary provider of vitals.  Collaboration with Minette Brine, FNP to inform of event along with patients refusal for medical treatment. SW communicated with Barb Merino RN Case Manager regarding vitals and patients refusal for medical treatment.   Outpatient Encounter Medications as of 02/09/2020  Medication Sig Note  . albuterol (PROAIR HFA) 108 (90 Base) MCG/ACT inhaler Inhale 2 puffs into the lungs every 6 (six) hours as needed for wheezing or shortness of breath.   . Ascorbic Acid (VITAMIN C) 1000 MG tablet Take 1,000 mg by mouth daily.   . baclofen (LIORESAL) 20 MG tablet TAKE 1 AND 1/2 TABLETS BY  MOUTH IN THE MORNING AND IN THE EVENING AND 1 TABLET AT MIDDAY   . cetirizine (ZYRTEC) 10 MG tablet TAKE 1 TABLET BY MOUTH EVERY DAY   . Cholecalciferol (VITAMIN D3) 5000 units CAPS Take by mouth.   . Cranberry-Vitamin C-Vitamin E (CRANBERRY PLUS VITAMIN C) 4200-20-3 MG-MG-UNIT CAPS Take by mouth.   . cyanocobalamin (,VITAMIN B-12,) 1000 MCG/ML injection Inject 1,000 mcg into the muscle every 3 (three) months.    . desloratadine (CLARINEX) 5 MG tablet Take 1 tablet (5 mg total) by mouth daily.   . diazepam (VALIUM) 10 MG tablet Take 2 tablets (20 mg total) by mouth 3 (three) times daily.   . diphenhydrAMINE (BENADRYL) 25 MG tablet Take 25 mg by mouth at bedtime as needed for itching or allergies.  01/17/2017: Takes as needed  . EPINEPHRINE 0.3 mg/0.3 mL IJ SOAJ injection INJECT INTRAMUSCULARLY  0.3ML AS NEEDED FOR  ANAPHYLAXIS . SEEK MEDICAL  ATTENTION AFTER USE.   [name]  famotidine (PEPCID) 20 MG tablet TAKE 1 TABLET BY MOUTH TWICE A DAY   . FLUoxetine (PROZAC) 10 MG capsule Take 1 capsule (10 mg total) by mouth daily.   [name] gabapentin (NEURONTIN) 300 MG capsule TAKE 1 CAPSULE BY MOUTH TWO TIMES DAILY   . Glucose Blood (ACCU-CHEK SIMPLICITY TEST STRP VI) by In Vitro route. Insert 1 by subcutaneous route 4 times every day check blood sugar before breakfast, lunch and dinner and bedtime   .  hydrOXYzine (VISTARIL) 100 MG capsule TAKE 1 CAPSULE BY MOUTH 3  TIMES DAILY AS NEEDED FOR  ITCHING   . metFORMIN (GLUCOPHAGE) 500 MG tablet Take 2 tablets (1,000 mg total) by mouth daily with breakfast.   . Olopatadine HCl (PAZEO) 0.7 % SOLN Apply 1 drop to eye as needed.    . vitamin E (VITAMIN E) 400 UNIT capsule Take 400 Units by mouth daily.    No facility-administered encounter medications on file as of 02/09/2020.    Follow Up Plan: SW will contact the patient over the next 5 days to complete SDOH screen and assist with resource needs.  [name], BSW, CDP Social Worker, Certified Dementia Practitioner [facility] Care Management [phone]

## 2020-02-09 NOTE — Telephone Encounter (Signed)
Left vm for pt to return call. Pt needs to go to er or schedule virtual visit for today

## 2020-02-10 ENCOUNTER — Other Ambulatory Visit: Payer: Self-pay

## 2020-02-10 ENCOUNTER — Telehealth: Payer: Medicare Other

## 2020-02-10 ENCOUNTER — Ambulatory Visit: Payer: Self-pay

## 2020-02-10 DIAGNOSIS — E119 Type 2 diabetes mellitus without complications: Secondary | ICD-10-CM

## 2020-02-10 DIAGNOSIS — G2582 Stiff-man syndrome: Secondary | ICD-10-CM

## 2020-02-10 DIAGNOSIS — J45909 Unspecified asthma, uncomplicated: Secondary | ICD-10-CM

## 2020-02-10 NOTE — Chronic Care Management (AMB) (Signed)
Chronic Care Management   Follow Up Note   02/10/2020 Name: Sherri [name] MRN: [id number] DOB: Aug 06, 1966  Referred by: Arnette Felts, FNP Reason for referral : Chronic Care Management (F/U - inbound call )   Sherri [name] is a 54 y.o. year old female who is a primary care patient of Arnette Felts, FNP. The CCM team was consulted for assistance with chronic disease management and care coordination needs.    Review of patient status, including review of consultants reports, relevant laboratory and other test results, and collaboration with appropriate care team members and the patient's provider was performed as part of comprehensive patient evaluation and provision of chronic care management services.    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 See Care Plan for related entries.   Inbound call received from patient advising she is doing better today and was "just sleepy" yesterday morning. Message states her FBS this morning was 62 and she continues to follow the new Diabetic medication regimen directed by PCP provider Arnette Felts, FNP.   Outpatient Encounter Medications as of 02/10/2020  Medication Sig Note  . glipiZIDE (GLUCOTROL) 5 MG tablet Take 5 mg by mouth daily before breakfast.   . albuterol (PROAIR HFA) 108 (90 Base) MCG/ACT inhaler Inhale 2 puffs into the lungs every 6 (six) hours as needed for wheezing or shortness of breath.   . Ascorbic Acid (VITAMIN C) 1000 MG tablet Take 1,000 mg by mouth daily.   . baclofen (LIORESAL) 20 MG tablet TAKE 1 AND 1/2 TABLETS BY  MOUTH IN THE MORNING AND IN THE EVENING AND 1 TABLET AT MIDDAY   . cetirizine (ZYRTEC) 10 MG tablet TAKE 1 TABLET BY MOUTH EVERY DAY   . Cholecalciferol (VITAMIN D3) 5000 units CAPS Take by mouth.   . Cranberry-Vitamin C-Vitamin E (CRANBERRY PLUS VITAMIN C) 4200-20-3 MG-MG-UNIT CAPS Take by mouth.   . cyanocobalamin (,VITAMIN B-12,) 1000  MCG/ML injection Inject 1,000 mcg into the muscle every 3 (three) months.    . desloratadine (CLARINEX) 5 MG tablet Take 1 tablet (5 mg total) by mouth daily.   . diazepam (VALIUM) 10 MG tablet Take 2 tablets (20 mg total) by mouth 3 (three) times daily.   . diphenhydrAMINE (BENADRYL) 25 MG tablet Take 25 mg by mouth at bedtime as needed for itching or allergies.  01/17/2017: Takes as needed  . EPINEPHRINE 0.3 mg/0.3 mL IJ SOAJ injection INJECT INTRAMUSCULARLY  0.3ML AS NEEDED FOR  ANAPHYLAXIS . SEEK MEDICAL  ATTENTION AFTER USE.   . famotidine (PEPCID) 20 MG tablet TAKE 1 TABLET BY MOUTH TWICE A DAY   . FLUoxetine (PROZAC) 10 MG capsule Take 1 capsule (10 mg total) by mouth daily.   [name] gabapentin (NEURONTIN) 300 MG capsule TAKE 1 CAPSULE BY MOUTH TWO TIMES DAILY   . Glucose Blood (ACCU-CHEK SIMPLICITY TEST STRP VI) by In Vitro route. Insert 1 by subcutaneous route 4 times every day check blood sugar before breakfast, lunch and dinner and bedtime   . hydrOXYzine (VISTARIL) 100 MG capsule TAKE 1 CAPSULE BY MOUTH 3  TIMES DAILY AS NEEDED FOR  ITCHING   . metFORMIN (GLUCOPHAGE) 500 MG tablet Take 2 tablets (1,000 mg total) by mouth daily with breakfast.   . Olopatadine HCl (PAZEO) 0.7 % SOLN Apply 1 drop to eye as needed.    . vitamin E (VITAMIN E) 400 UNIT capsule Take 400 Units by mouth daily.    No facility-administered  encounter medications on file as of 02/10/2020.     Objective:  Lab Results  Component Value Date   HGBA1C 8.5 (H) 01/15/2020   HGBA1C 5.9 (H) 04/23/2019   HGBA1C 5.8 09/05/2018   Lab Results  Component Value Date   CREATININE 1.13 (H) 01/15/2020   BP Readings from Last 3 Encounters:  01/15/20 114/70  12/09/19 109/71  11/10/19 136/69    Goals Addressed      Patient Stated   . "I am running out food and need housing resources" (pt-stated)       Current Barriers:  [name] Knowledge Deficits related to food and housing resources . Lacks caregiver support.  . Museum/gallery exhibitions officer.  . 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 Determined patient is no longer receiving food stamps due to being notified her income was over the limit; discussed this is causing her to run out of food and or she is unable to purchase healthy foods to help her adhere to an ADA diet . Determined she is no longer able to afford Ensure or Glucerna and would like coupons or resources for this nutritional supplement . Determined she may need resources for housing and or contact information if needed to report her property manager is not doing all things necessary to get her into an apartment that does not contain mold spores . Collaborated with embedded BSW Daneen Schick  regarding patient's request for food and housing resources and coupons for Glucerna . Discussed plans with patient for ongoing care management follow up and provided patient with direct contact information for care management team  Patient Self Care Activities:  . Patient verbalizes understanding of plan to f/u with her property manager regarding moving her into a clean apartment that is mold free . Self administers medications as prescribed . Attends all scheduled provider appointments . Calls pharmacy for medication refills . Performs ADL's independently . Performs IADL's independently . Calls provider office for new concerns or questions  Initial goal documentation     . COMPLETED: "Not to have anymore falls" (pt-stated)       Current Barriers:  [name] Knowledge Deficits related to disease process and Self Health management for Stiff-Person Syndrome  . 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 Evaluation of current treatment plan related to Stiff-Person Syndrome and patient's adherence to plan as established by provider . Assessed for falls or fall related injuries; Determined patient denies having any further falls since our last contact . Determined her balance is stable at this time and she denies having any DME needs or home safety concerns related to gait and mobility at this time . Discussed plans with patient for ongoing care management follow up and provided patient with direct contact information for care management team  Patient Self Care Activities:  . Self administers  medications as prescribed . Attends all scheduled provider appointments . Calls pharmacy for medication refills . Performs ADL's independently . Performs IADL's independently . Calls provider office for new concerns or questions  Please see past updates related to this goal by clicking on the "Past Updates" button in the selected goal     . "To keep my Asthma under good control" (pt-stated)       Current Barriers:  [name] Knowledge Deficits related to disease process and Self Health management of Asthma . 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 Evaluation of current treatment plan related to Asthma and  patient's adherence to plan as established by provider. . Determined patient is concerned about potential health hazards that may exacerbate her Asthma due to direct exposure to mold and mildew from a water leak in her apartment . Determined the patient believes after having the water and carpet removed, it was not done so appropriately leaving behind mold/mildew . Educated patient on the potential implications of exposure to mold spores and how this can be harmful to her health, especially related to respiratory effects . Encouraged patient to follow up with her property manager again to inquire on her options to be moved to a new apartment . Sent in basket message to embedded BSW [name] requesting she contact patient to provide housing resources and or guidance concerning her housing issue with the need to be relocated to a new apartment  . Discussed plans with patient for ongoing care management follow up and provided patient with direct contact information for care management team  Patient Self Care Activities:  . Self administers medications as prescribed . Attends all scheduled provider appointments . Calls pharmacy for medication refills . Performs ADL's independently . Performs IADL's independently . Calls provider office for new concerns or questions  Please see past updates related to this goal by clicking on the "Past Updates" button in the selected goal      . "To keep my diabetes under good control" (pt-stated)       Current Barriers:  [name] Knowledge Deficits related to disease process and Self Health management of Asthma . 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 Evaluation of current treatment plan related to Diabetes and patient's adherence to plan as established by provider. . Provided education to patient re: increase in A1C to 8.5 obtained on 01/15/20; educated on target A1C <7.0 and how to achieve this goal; education provided re: daily glycemic goal of 80-130 to help maintain/achieve lower A1C; educated patient on interventions to take with hypoglycemic events; patient reports 1 low of 67, and 1 high in 300's but average is within goal; she randomly checks FBS  . Reviewed medications with patient and discussed recent in increase with Metformin to 500 mg 2 tablets twice daily (1000 mg total), discussed additional recommendations to add Glipizide back to  her regimen taking 1 tablet daily with breakfast; patient states she is adhering but is having GI SE related to this txt plan . Discussed plans with patient for ongoing care management follow up and provided patient with direct contact information for care management team . Advised patient, providing education and rationale, to check cbg daily before meals and record, calling the CCM team and or PCP for findings outside established parameters   02/10/20 Inbound call from patient, voice message received  . Patient stated she is taking her medications exactly as prescribed and continues to have GI SE since increasing the Metformin and adding Glipizide; FBS this morning was 62 . Collaborated with embedded Pharm D Vanice Sarah via in basket message, requested to f/u with patient; advised patient's A1C has increased to 8.5 and patient reports having SE to increased dose of Metformin  Patient Self Care Activities:  . Self administers medications as prescribed . Attends all scheduled provider appointments . Calls pharmacy for medication refills . Performs ADL's independently . Performs IADL's independently . Calls provider office for new concerns or questions  Please see past updates related to  this goal by clicking on the "Past Updates" button in the selected goal        Plan:   Telephone follow up appointment with care management team member scheduled for:03/02/20  Delsa Sale, RN, BSN, CCM Care Management Coordinator [facility] Care Management/[facility]  Direct Phone: [phone]

## 2020-02-10 NOTE — Patient Instructions (Signed)
Visit Information  Goals Addressed      Patient Stated   . "I am running out food and need housing resources" (pt-stated)       Current Barriers:  [name] Knowledge Deficits related to food and housing resources . Lacks caregiver support.  . Corporate treasurer.  . 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 Determined patient is no longer receiving food stamps due to being notified her income was over the limit; discussed this is causing her to run out of food and or she is unable to purchase healthy foods to help her adhere to an ADA diet . Determined she is no longer able to afford Ensure or Glucerna and would like coupons or resources for this nutritional supplement . Determined she may need resources for housing and or contact information if needed to report her property manager is not doing all things necessary to get her into an apartment that does not contain mold spores . Collaborated with embedded BSW [name]  regarding patient's request for food and housing resources and coupons for Glucerna . Discussed plans with patient for ongoing care management follow up and provided patient with direct contact information for care management team  Patient Self Care Activities:  . Patient verbalizes understanding of plan to f/u with her property manager regarding moving her into a clean apartment that is mold free . Self administers medications as prescribed . Attends all scheduled provider appointments . Calls pharmacy for medication refills . Performs ADL's independently . Performs IADL's independently . Calls provider office for new concerns or questions  Initial goal documentation      . COMPLETED: "Not to have anymore falls" (pt-stated)       Current Barriers:  [name] Knowledge Deficits related to disease process and Self Health management for Stiff-Person Syndrome  . 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 Evaluation of current treatment plan related to Stiff-Person Syndrome and patient's adherence to plan as established by provider . Assessed for falls or fall related injuries; Determined patient denies having any further falls since our last contact . Determined her balance is stable at this time and she denies having any DME needs or home safety concerns related to gait and mobility at this time . Discussed plans with patient for ongoing care management follow up and provided patient with direct contact information for care management team  Patient Self Care Activities:  . Self administers medications as prescribed . Attends all scheduled provider appointments . Calls pharmacy for medication refills . Performs ADL's independently . Performs IADL's independently . Calls provider office for new concerns or questions  Please see past updates related to this goal by clicking on the "Past Updates" button in the selected goal      . "To keep my Asthma under good control" (pt-stated)  Current Barriers:  [name] Knowledge Deficits related to disease process and Self Health management of Asthma . 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 Evaluation of current treatment plan related to Asthma and patient's adherence to plan as established by provider. . Determined patient is concerned about potential health hazards that may exacerbate her Asthma due to direct exposure to mold and mildew from a water leak in her apartment . Determined the patient believes after having the water and carpet removed, it was not done so appropriately leaving behind mold/mildew . Educated patient on the potential implications of exposure to mold spores and how this can be harmful to her health, especially related to respiratory effects . Encouraged patient to follow up with her property manager again to inquire on her options to be moved to a new apartment . Sent in basket message to embedded BSW [name] requesting she contact patient to provide housing resources and or guidance concerning her housing issue with the need to be relocated to a new apartment  . Discussed plans with patient for ongoing care management follow up and provided patient with direct contact information for care management team  Patient Self Care Activities:  . Self administers medications as prescribed . Attends all scheduled provider appointments . Calls pharmacy for medication refills . Performs ADL's independently . Performs IADL's independently . Calls provider office for new concerns or questions  Please see past updates related to this goal by clicking on the "Past Updates" button in the selected goal      . "To keep my diabetes under good control" (pt-stated)       Current Barriers:  [name] Knowledge Deficits related to disease process and Self Health management of Asthma . Chronic Disease Management support and education needs related to Diabetes, Asthma, Stiff-Person  Syndrome  Nurse Case Manager Clinical Goal(s):  [name] Over the next 90 days, patient will work with the CCM team and PCP to address needs related to disease education and support for Diabetes  CCM RN CM Interventions:  02/06/20 Late Entry - call completed with patient  . Evaluation of current treatment plan related to Diabetes and patient's adherence to plan as established by provider. . Provided education to patient re: increase in A1C to 8.5 obtained on 01/15/20; educated on target A1C <7.0 and how to achieve this goal; education provided re: daily glycemic goal of 80-130 to help maintain/achieve lower A1C; educated patient on interventions to take with hypoglycemic events; patient reports 1 low of 67, and 1 high in 300's but average is within goal; she randomly checks FBS  . Reviewed medications with patient and discussed recent in increase with Metformin to 500 mg 2 tablets twice daily (1000 mg total), discussed additional recommendations to add Glipizide back to her regimen taking 1 tablet daily with breakfast; patient states she is adhering but is having GI SE related to this txt plan . Discussed plans with patient for ongoing care management follow up and provided patient with direct contact information for care management team . Advised patient, providing education and rationale, to check cbg daily before meals and record, calling the CCM team and or PCP for findings outside established  parameters   02/10/20 Inbound call from patient, voice message received  . Patient stated she is taking her medications exactly as prescribed and continues to have GI SE since increasing the Metformin and adding Glipizide; FBS this morning was 62 . Collaborated with embedded Pharm D Lottie Dawson via in basket message, requested to f/u with patient; advised patient's A1C has increased to 8.5 and patient reports having SE to increased dose of Metformin  Patient Self Care Activities:  . Self administers medications  as prescribed . Attends all scheduled provider appointments . Calls pharmacy for medication refills . Performs ADL's independently . Performs IADL's independently . Calls provider office for new concerns or questions  Please see past updates related to this goal by clicking on the "Past Updates" button in the selected goal         The patient verbalized understanding of instructions provided today and declined a print copy of patient instruction materials.   Telephone follow up appointment with care management team member scheduled for: 03/02/20  Barb Merino, RN, BSN, CCM Care Management Coordinator [place] [facility]  Direct Phone: [phone]

## 2020-02-13 ENCOUNTER — Telehealth: Payer: Self-pay

## 2020-02-13 ENCOUNTER — Ambulatory Visit: Payer: Self-pay

## 2020-02-13 DIAGNOSIS — E119 Type 2 diabetes mellitus without complications: Secondary | ICD-10-CM

## 2020-02-13 NOTE — Chronic Care Management (AMB) (Signed)
  Chronic Care Management   Outreach Note  02/13/2020 Name: Ceola Para MRN: [id number] DOB: Jun 21, 1966  Referred by: Arnette Felts, FNP Reason for referral : Care Coordination   SW placed an unsuccessful outbound call to the patient to assist with resource needs. SW left a HIPAA compliant voice message requesting a return call.  Follow Up Plan: The care management team will reach out to the patient again over the next 10 days.   [name], BSW, CDP Social Worker, Certified Dementia Practitioner [facility] Care Management [phone]

## 2020-02-16 ENCOUNTER — Encounter: Payer: Self-pay | Admitting: Nurse Practitioner

## 2020-02-16 ENCOUNTER — Ambulatory Visit (INDEPENDENT_AMBULATORY_CARE_PROVIDER_SITE_OTHER): Payer: Medicare Other | Admitting: Nurse Practitioner

## 2020-02-16 ENCOUNTER — Other Ambulatory Visit: Payer: Self-pay

## 2020-02-16 VITALS — BP 114/80 | HR 56 | Temp 98.4°F | Wt 102.2 lb

## 2020-02-16 DIAGNOSIS — E119 Type 2 diabetes mellitus without complications: Secondary | ICD-10-CM

## 2020-02-16 DIAGNOSIS — R110 Nausea: Secondary | ICD-10-CM | POA: Diagnosis not present

## 2020-02-16 MED ORDER — METFORMIN HCL 500 MG PO TABS: 1000.0000 mg | ORAL_TABLET | Freq: Two times a day (BID) | ORAL | 1 refills | Status: DC

## 2020-02-16 MED ORDER — METFORMIN HCL ER (MOD) 1000 MG PO TB24: 1000.0000 mg | ORAL_TABLET | Freq: Two times a day (BID) | ORAL | 1 refills | Status: DC

## 2020-02-16 NOTE — Patient Instructions (Signed)
   When you get you new medication from [facility] take one tablet (1000mg ) two times daily

## 2020-02-16 NOTE — Progress Notes (Signed)
This visit occurred during the SARS-CoV-2 public health emergency.  Safety protocols were in place, including screening questions prior to the visit, additional usage of staff PPE, and extensive cleaning of exam room while observing appropriate contact time as indicated for disinfecting solutions.  Subjective:     Patient ID: Sherri [name] , female    DOB: 10/28/1966 , 54 y.o.   MRN: [id number]   Chief Complaint  Patient presents with  . Diabetes    patient stated he blood sugars have been running really well 60s-100s.     HPI  She reports when the social worker called and said she was sounding confused, she reports she was sleepy.  She reports she had been up with a friend during the night.  She had 911 come in and was asked about sleeping late due to her medications. She was able to answer all the questions.  Her blood sugar was 387, blood pressure was 92/58.  She declined going to the hospital.  Blood sugar has been as low as 62 since last week.  Drank some orange juice.    Wt Readings from Last 3 Encounters: 02/16/20 : 102 lb 3.2 oz (46.4 kg) 01/15/20 : 113 lb (51.3 kg) 12/10/19 : 108 lb 0.4 oz (49 kg)   Diabetes She presents for her follow-up diabetic visit. She has type 2 diabetes mellitus. There are no hypoglycemic associated symptoms. Pertinent negatives for hypoglycemia include no dizziness or headaches. There are no diabetic associated symptoms. Pertinent negatives for diabetes include no chest pain. There are no hypoglycemic complications. There are no diabetic complications. Risk factors for coronary artery disease include diabetes mellitus. Current diabetic treatment includes oral agent (dual therapy) (just started back with the glimiperide). She is compliant with treatment most of the time. She is following a generally unhealthy diet. She has not had a previous visit with a dietitian. She rarely participates in exercise. (Averaging in the 80's, she had one low of 62 within the  last week. )     Past Medical History:  Diagnosis Date  . Angioedema   . Anxiety   . Arthritis   . Asthma   . Benzodiazepine withdrawal (HCC) 08/30/2012  . CAP (community acquired pneumonia) 08/29/2012  . Chronic back pain   . Chronic kidney disease   . Closed nondisplaced fracture of proximal phalanx of right little finger 10/18/2018  . Cocaine abuse (HCC)   . Depression   . Diarrhea   . DVT (deep venous thrombosis) (HCC)   . Environmental allergies   . Fall   . GERD (gastroesophageal reflux disease)   . Heart murmur    has been told once that she has a heart murmur, but has never had any problems  . Hiatal hernia 03/21/2013  . Lumbar herniated disc   . Peripheral vascular disease (HCC)   . Pernicious anemia 10/30/2011  . Postconcussion syndrome 12/30/2014  . Rhabdomyolysis 08/27/2012  . Seasonal allergies   . Stiff person syndrome   . Urticaria      Family History  Problem Relation Age of Onset  . Cancer Mother   . Other Mother   . COPD Father   . Asthma Brother   . Cancer Brother        colon  . Heart attack Brother   . Seizures Brother   . Allergic rhinitis Neg Hx   . Angioedema Neg Hx   . Eczema Neg Hx   . Immunodeficiency Neg Hx   . Urticaria Neg Hx  Current Outpatient Medications:  .  albuterol (PROAIR HFA) 108 (90 Base) MCG/ACT inhaler, Inhale 2 puffs into the lungs every 6 (six) hours as needed for wheezing or shortness of breath., Disp: 18 g, Rfl: 1 .  Ascorbic Acid (VITAMIN C) 1000 MG tablet, Take 1,000 mg by mouth daily., Disp: , Rfl:  .  baclofen (LIORESAL) 20 MG tablet, TAKE 1 AND 1/2 TABLETS BY  MOUTH IN THE MORNING AND IN THE EVENING AND 1 TABLET AT MIDDAY, Disp: 360 tablet, Rfl: 3 .  cetirizine (ZYRTEC) 10 MG tablet, TAKE 1 TABLET BY MOUTH EVERY DAY, Disp: 30 tablet, Rfl: 2 .  Cholecalciferol (VITAMIN D3) 5000 units CAPS, Take by mouth., Disp: , Rfl:  .  Cranberry-Vitamin C-Vitamin E (CRANBERRY PLUS VITAMIN C) 4200-20-3 MG-MG-UNIT CAPS, Take by  mouth., Disp: , Rfl:  .  cyanocobalamin (,VITAMIN B-12,) 1000 MCG/ML injection, Inject 1,000 mcg into the muscle every 3 (three) months. , Disp: , Rfl:  .  desloratadine (CLARINEX) 5 MG tablet, Take 1 tablet (5 mg total) by mouth daily., Disp: 90 tablet, Rfl: 1 .  diazepam (VALIUM) 10 MG tablet, Take 2 tablets (20 mg total) by mouth 3 (three) times daily., Disp: 180 tablet, Rfl: 3 .  diphenhydrAMINE (BENADRYL) 25 MG tablet, Take 25 mg by mouth at bedtime as needed for itching or allergies. , Disp: , Rfl:  .  EPINEPHRINE 0.3 mg/0.3 mL IJ SOAJ injection, INJECT INTRAMUSCULARLY  0.3ML AS NEEDED FOR  ANAPHYLAXIS . SEEK MEDICAL  ATTENTION AFTER USE., Disp: 2 each, Rfl: 0 .  famotidine (PEPCID) 20 MG tablet, TAKE 1 TABLET BY MOUTH TWICE A DAY, Disp: 60 tablet, Rfl: 0 .  FLUoxetine (PROZAC) 10 MG capsule, Take 1 capsule (10 mg total) by mouth daily., Disp: 90 capsule, Rfl: 1 .  gabapentin (NEURONTIN) 300 MG capsule, TAKE 1 CAPSULE BY MOUTH TWO TIMES DAILY, Disp: 180 capsule, Rfl: 3 .  glipiZIDE (GLUCOTROL) 5 MG tablet, Take 5 mg by mouth daily before breakfast., Disp: , Rfl:  .  Glucose Blood (ACCU-CHEK SIMPLICITY TEST STRP VI), by In Vitro route. Insert 1 by subcutaneous route 4 times every day check blood sugar before breakfast, lunch and dinner and bedtime, Disp: , Rfl:  .  hydrOXYzine (VISTARIL) 100 MG capsule, TAKE 1 CAPSULE BY MOUTH 3  TIMES DAILY AS NEEDED FOR  ITCHING, Disp: 90 capsule, Rfl: 0 .  metFORMIN (GLUCOPHAGE) 500 MG tablet, Take 2 tablets (1,000 mg total) by mouth daily with breakfast., Disp: 90 tablet, Rfl: 1 .  Olopatadine HCl (PAZEO) 0.7 % SOLN, Apply 1 drop to eye as needed. , Disp: , Rfl:  .  vitamin E (VITAMIN E) 400 UNIT capsule, Take 400 Units by mouth daily., Disp: , Rfl:    Allergies  Allergen Reactions  . Ibuprofen Other (See Comments)    Does not take due to hx of renal insufficiency "I have kidney disease"   . Lemon Flavor Swelling    Severe Lip Swelling  FRUIT per pt.     . Amoxicillin Diarrhea and Other (See Comments)  . Tylenol [Acetaminophen] Hives    Cannot take large quantities     Review of Systems  Constitutional: Negative.   Respiratory: Negative.   Cardiovascular: Negative.  Negative for chest pain, palpitations and leg swelling.  Neurological: Negative for dizziness and headaches.  Psychiatric/Behavioral: Negative.      Today's Vitals   02/16/20 1449  BP: 114/80  Pulse: (!) 56  Temp: 98.4 F (36.9 C)  TempSrc: Oral  Weight: 102 lb 3.2 oz (46.4 kg)   Body mass index is 18.1 kg/m.   Objective:  Physical Exam Constitutional:      Appearance: Normal appearance.  Cardiovascular:     Rate and Rhythm: Normal rate and regular rhythm.     Pulses: Normal pulses.     Heart sounds: Normal heart sounds. No murmur.  Pulmonary:     Effort: Pulmonary effort is normal. No respiratory distress.     Breath sounds: Normal breath sounds.  Skin:    Capillary Refill: Capillary refill takes less than 2 seconds.  Neurological:     General: No focal deficit present.     Mental Status: She is alert and oriented to person, place, and time.  Psychiatric:        Mood and Affect: Mood normal.        Behavior: Behavior normal.        Thought Content: Thought content normal.        Judgment: Judgment normal.         Assessment And Plan:     1. Type 2 diabetes mellitus without complication, without long-term current use of insulin (HCC)  Will change her metformin to XR to help with nausea  Her recent episode of disorientation sounds like was related to being sleepy  She is to continue monitoring her blood sugar daily - metFORMIN (GLUMETZA) 1000 MG (MOD) 24 hr tablet; Take 1 tablet (1,000 mg total) by mouth 2 (two) times daily with a meal.  Dispense: 180 tablet; Refill: 1  2. Nausea  Take glipizide in middle of day to see if helps with nausea.    Minette Brine, FNP    THE PATIENT IS ENCOURAGED TO PRACTICE SOCIAL DISTANCING DUE TO THE  COVID-19 PANDEMIC.

## 2020-02-17 ENCOUNTER — Ambulatory Visit: Payer: Medicare Other

## 2020-02-17 DIAGNOSIS — J45909 Unspecified asthma, uncomplicated: Secondary | ICD-10-CM

## 2020-02-17 DIAGNOSIS — E119 Type 2 diabetes mellitus without complications: Secondary | ICD-10-CM | POA: Diagnosis not present

## 2020-02-18 ENCOUNTER — Ambulatory Visit: Payer: Self-pay

## 2020-02-18 DIAGNOSIS — E119 Type 2 diabetes mellitus without complications: Secondary | ICD-10-CM

## 2020-02-18 NOTE — Patient Instructions (Signed)
Social Worker Visit Information  Goals we discussed today:  Goals Addressed            This Visit's Progress     Patient Stated   . "I am running out food and need housing resources" (pt-stated)   On track    Current Barriers:  [name] Knowledge Deficits related to food and housing resources . Lacks caregiver support.  . Corporate treasurer.  . 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 Received confirmation from [employer] the patient would receive meals on Friday February 26th . Collaboration with Trula Slade with [employer] who confirms she will send Glucerna samples to the patients primary providers office . Collaboration with Delana Meyer who has set samples at the front desk for the patient to pick-up . Unsuccessful outbound call placed to the patient to communicate above   CCM RN CM Interventions:  02/06/20 late entry - completed call with patient  . Determined patient is no longer receiving food stamps due to being notified her income was over the limit; discussed this is causing her to run out of food and or she is unable to purchase healthy foods to help her adhere to an ADA diet . Determined she is no longer able to afford Ensure or Glucerna and would like coupons or resources for this nutritional supplement . Determined she may need resources for housing and or contact information if needed to report her property manager is not doing all things necessary to get her into an apartment that does not contain mold spores . Collaborated with embedded BSW [name]  regarding patient's request for food and housing resources and coupons for Glucerna . Discussed plans with patient for ongoing  care management follow up and provided patient with direct contact information for care management team  Patient Self Care Activities:  . Patient verbalizes understanding of plan to f/u with her property manager regarding moving her into a clean apartment that is mold free . Self administers medications as prescribed . Attends all scheduled provider appointments . Calls pharmacy for medication refills . Performs ADL's independently . Performs IADL's independently . Calls provider office for new concerns or questions  Please see past updates related to this goal by clicking on the "Past Updates" button in the selected goal          Materials Provided: No. Patient not reached.  Follow Up Plan: SW will follow up with patient by phone over the next week  [name], BSW, CDP Social Worker, Certified Dementia Practitioner [facility] Care Management [phone]

## 2020-02-18 NOTE — Chronic Care Management (AMB) (Signed)
Chronic Care Management    Social Work General Note  02/17/2020 Name: Sherri [name] MRN: [id number] DOB: 06-Jun-1966  Sherri [name] is a 54 y.o. year old female who is a primary care patient of Minette Brine, [place]. The CCM was consulted to assist the patient with care coordination needs.   Review of patient status, including review of consultants reports, relevant laboratory and other test results, and collaboration with appropriate care team members and the patient's provider was performed as part of comprehensive patient evaluation and provision of chronic care management services.    SDOH (Social Determinants of Health) assessments and interventions performed:  Yes SDOH Interventions     Most Recent Value  SDOH Interventions  SDOH Interventions for the Following Domains  Food Insecurity  Food Insecurity Interventions  Other (Comment) [[employer] program]       Outpatient Encounter Medications as of 02/17/2020  Medication Sig Note  . albuterol (PROAIR HFA) 108 (90 Base) MCG/ACT inhaler Inhale 2 puffs into the lungs every 6 (six) hours as needed for wheezing or shortness of breath.   . Ascorbic Acid (VITAMIN C) 1000 MG tablet Take 1,000 mg by mouth daily.   . baclofen (LIORESAL) 20 MG tablet TAKE 1 AND 1/2 TABLETS BY  MOUTH IN THE MORNING AND IN THE EVENING AND 1 TABLET AT MIDDAY   . cetirizine (ZYRTEC) 10 MG tablet TAKE 1 TABLET BY MOUTH EVERY DAY   . Cholecalciferol (VITAMIN D3) 5000 units CAPS Take by mouth.   . Cranberry-Vitamin C-Vitamin E (CRANBERRY PLUS VITAMIN C) 4200-20-3 MG-MG-UNIT CAPS Take by mouth.   . cyanocobalamin (,VITAMIN B-12,) 1000 MCG/ML injection Inject 1,000 mcg into the muscle every 3 (three) months.    . desloratadine (CLARINEX) 5 MG tablet Take 1 tablet (5 mg total) by mouth daily.   . diazepam (VALIUM) 10 MG tablet Take 2 tablets (20 mg total) by mouth 3 (three) times daily.   . diphenhydrAMINE (BENADRYL) 25 MG tablet Take 25 mg by mouth at bedtime as  needed for itching or allergies.  01/17/2017: Takes as needed  . EPINEPHRINE 0.3 mg/0.3 mL IJ SOAJ injection INJECT INTRAMUSCULARLY  0.3ML AS NEEDED FOR  ANAPHYLAXIS . SEEK MEDICAL  ATTENTION AFTER USE.   . famotidine (PEPCID) 20 MG tablet TAKE 1 TABLET BY MOUTH TWICE A DAY   . FLUoxetine (PROZAC) 10 MG capsule Take 1 capsule (10 mg total) by mouth daily.   [name] gabapentin (NEURONTIN) 300 MG capsule TAKE 1 CAPSULE BY MOUTH TWO TIMES DAILY   . glipiZIDE (GLUCOTROL) 5 MG tablet Take 5 mg by mouth daily before breakfast.   . Glucose Blood (ACCU-CHEK SIMPLICITY TEST STRP VI) by In Vitro route. Insert 1 by subcutaneous route 4 times every day check blood sugar before breakfast, lunch and dinner and bedtime   . hydrOXYzine (VISTARIL) 100 MG capsule TAKE 1 CAPSULE BY MOUTH 3  TIMES DAILY AS NEEDED FOR  ITCHING   . metFORMIN (GLUMETZA) 1000 MG (MOD) 24 hr tablet Take 1 tablet (1,000 mg total) by mouth 2 (two) times daily with a meal.   . Olopatadine HCl (PAZEO) 0.7 % SOLN Apply 1 drop to eye as needed.    . vitamin E (VITAMIN E) 400 UNIT capsule Take 400 Units by mouth daily.    No facility-administered encounter medications on file as of 02/17/2020.    Goals Addressed            This Visit's Progress     Patient Stated   . "  I am running out food and need housing resources" (pt-stated)       Current Barriers:  [name] Knowledge Deficits related to food and housing resources . Lacks caregiver support.  . Corporate treasurer.  . 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 Outbound call placed to the patient to review SDOH needs . Determined the patient is experiencing food insecurity due to inability to afford nutritional  supplements as well as DM friendly diet o Performed chart review to note patient has lost 10 pounds in 1 month o Referral placed to [employer] to obtain 4 weeks worth of diabetic friendly meals  o Collaboration with [employer] to request Glucerna samples be sent to the patients provider office o Advised the patient once samples are delivered she will be contacted to pick up the items o Reviewed patients current income status which is over the income limit for food and nutrition assistance - Patient reports she has accessed [employer] for some food support but notes the food offered is not tailored to her nutritional needs - Discussed plans to look for alternative resources to assist with food insecurity o Collaboration with patients primary care provider and embedded RN Case Manager regarding above interventions . 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 Verdis Frederickson to contact the [employer] on behalf of the patient for housing assistance . Patient has yet to receive return call from Mr. Verdis Frederickson regarding outcome of referral . Advised the patient SW would await to assist with housing interventions until she receives communication from Mr. Verdis Frederickson  Patient Self Care Activities:  . Patient verbalizes understanding of plan to f/u with her property manager regarding moving her into a clean apartment that is mold free . Self administers medications as prescribed . Attends all scheduled provider appointments . Calls pharmacy for medication  refills . Performs ADL's independently . Performs IADL's independently . Calls provider office for new concerns or questions  Please see past updates related to this goal by clicking on the "Past Updates" button in the selected goal          Follow Up Plan: SW will follow up with patient by phone over the next 48 hours to confirm meal delivery date.       [name], BSW, CDP Social Worker, Certified Dementia Practitioner [facility] Management [phone]  Total time spent performing care coordination and/or care management activities with the patient by phone or face to face = 75 minutes.

## 2020-02-18 NOTE — Patient Instructions (Signed)
Social Worker Visit Information  Goals we discussed today:  Goals Addressed            This Visit's Progress     Patient Stated   . "I am running out food and need housing resources" (pt-stated)       Current Barriers:  [name] Knowledge Deficits related to food and housing resources . Lacks caregiver support.  . Corporate treasurer.  . 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 Outbound call placed to the patient to review SDOH needs . Determined the patient is experiencing food insecurity due to inability to afford nutritional supplements as well as DM friendly diet o Performed chart review to note patient has lost 10 pounds in 1 month o Referral placed to [employer] to obtain 4 weeks worth of diabetic friendly meals  o Collaboration with [employer] to request Glucerna samples be sent to the patients provider office o Advised the patient once samples are delivered she will be contacted to pick up the items o Reviewed patients current income status which is over the income limit for food and nutrition assistance - Patient reports she has accessed [employer] for some food support but notes the food offered is not tailored to her nutritional needs - Discussed plans to look for alternative resources to assist with food insecurity o Collaboration with patients primary care provider and embedded RN Case Manager regarding above interventions . 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 Verdis Frederickson to contact the [employer] on behalf of the patient for housing assistance . Patient has yet to receive return call from Mr. Verdis Frederickson regarding outcome of referral . Advised the patient SW would await to assist with housing interventions until she receives communication from Mr. Verdis Frederickson  Patient Self Care Activities:  . Patient verbalizes understanding of plan to f/u with her property manager regarding moving her into a clean apartment that is mold free . Self administers medications as prescribed . Attends all scheduled provider appointments . Calls pharmacy for medication refills . Performs ADL's independently . Performs IADL's independently . Calls provider office for new concerns or questions  Please see past updates related to this goal by clicking on the "Past Updates" button in the selected goal          Materials Provided: Verbal education about community resources provided by phone  Follow Up Plan: SW will follow up with patient by phone over the next 48 hours to confirm meal delivery.   [name], BSW, CDP Social Worker, Certified Dementia Practitioner [facility] Care Management [phone]

## 2020-02-18 NOTE — Chronic Care Management (AMB) (Signed)
Chronic Care Management    Social Work Follow Up Note  02/18/2020 Name: Fonnie Crookshanks MRN: [id number] DOB: 1966-11-27  Margaretmary Prisk is a 54 y.o. year old female who is a primary care patient of Arnette Felts, FNP. The CCM team was consulted for assistance with care coordination.   Review of patient status, including review of consultants reports, other relevant assessments, and collaboration with appropriate care team members and the patient's provider was performed as part of comprehensive patient evaluation and provision of chronic care management services.    SW placed an unsuccessful outbound call to the patient to assist with care coordination needs  Outpatient Encounter Medications as of 02/18/2020  Medication Sig Note  . albuterol (PROAIR HFA) 108 (90 Base) MCG/ACT inhaler Inhale 2 puffs into the lungs every 6 (six) hours as needed for wheezing or shortness of breath.   . Ascorbic Acid (VITAMIN C) 1000 MG tablet Take 1,000 mg by mouth daily.   . baclofen (LIORESAL) 20 MG tablet TAKE 1 AND 1/2 TABLETS BY  MOUTH IN THE MORNING AND IN THE EVENING AND 1 TABLET AT MIDDAY   . cetirizine (ZYRTEC) 10 MG tablet TAKE 1 TABLET BY MOUTH EVERY DAY   . Cholecalciferol (VITAMIN D3) 5000 units CAPS Take by mouth.   . Cranberry-Vitamin C-Vitamin E (CRANBERRY PLUS VITAMIN C) 4200-20-3 MG-MG-UNIT CAPS Take by mouth.   . cyanocobalamin (,VITAMIN B-12,) 1000 MCG/ML injection Inject 1,000 mcg into the muscle every 3 (three) months.    . desloratadine (CLARINEX) 5 MG tablet Take 1 tablet (5 mg total) by mouth daily.   . diazepam (VALIUM) 10 MG tablet Take 2 tablets (20 mg total) by mouth 3 (three) times daily.   . diphenhydrAMINE (BENADRYL) 25 MG tablet Take 25 mg by mouth at bedtime as needed for itching or allergies.  01/17/2017: Takes as needed  . EPINEPHRINE 0.3 mg/0.3 mL IJ SOAJ injection INJECT INTRAMUSCULARLY  0.3ML AS NEEDED FOR  ANAPHYLAXIS . SEEK MEDICAL  ATTENTION AFTER USE.   . famotidine  (PEPCID) 20 MG tablet TAKE 1 TABLET BY MOUTH TWICE A DAY   . FLUoxetine (PROZAC) 10 MG capsule Take 1 capsule (10 mg total) by mouth daily.   [name] gabapentin (NEURONTIN) 300 MG capsule TAKE 1 CAPSULE BY MOUTH TWO TIMES DAILY   . glipiZIDE (GLUCOTROL) 5 MG tablet Take 5 mg by mouth daily before breakfast.   . Glucose Blood (ACCU-CHEK SIMPLICITY TEST STRP VI) by In Vitro route. Insert 1 by subcutaneous route 4 times every day check blood sugar before breakfast, lunch and dinner and bedtime   . hydrOXYzine (VISTARIL) 100 MG capsule TAKE 1 CAPSULE BY MOUTH 3  TIMES DAILY AS NEEDED FOR  ITCHING   . metFORMIN (GLUMETZA) 1000 MG (MOD) 24 hr tablet Take 1 tablet (1,000 mg total) by mouth 2 (two) times daily with a meal.   . Olopatadine HCl (PAZEO) 0.7 % SOLN Apply 1 drop to eye as needed.    . vitamin E (VITAMIN E) 400 UNIT capsule Take 400 Units by mouth daily.    No facility-administered encounter medications on file as of 02/18/2020.     Goals Addressed            This Visit's Progress     Patient Stated   . "I am running out food and need housing resources" (pt-stated)   On track    Current Barriers:  [name] Knowledge Deficits related to food and housing resources . Lacks caregiver support.  . Corporate treasurer.  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 Received confirmation from [employer] the patient would receive meals on Friday February 26th . Collaboration with Inis Sizer with [employer] who confirms she will send Glucerna samples to the patients primary providers office . Collaboration with Octavio Manns who has set samples at the front desk for the patient to pick-up . Unsuccessful outbound  call placed to the patient to communicate above  Patient Self Care Activities:  . Patient verbalizes understanding of plan to f/u with her property manager regarding moving her into a clean apartment that is mold free . Self administers medications as prescribed . Attends all scheduled provider appointments . Calls pharmacy for medication refills . Performs ADL's independently . Performs IADL's independently . Calls provider office for new concerns or questions  Please see past updates related to this goal by clicking on the "Past Updates" button in the selected goal          Follow Up Plan: SW will follow up with patient by phone over the next week.   Daneen Schick, BSW, CDP Social Worker, Certified Dementia Practitioner [facility] [place] Management [phone]  Total time spent performing care coordination and/or care management activities with the patient by phone or face to face = 10 minutes.

## 2020-02-19 ENCOUNTER — Ambulatory Visit: Payer: Self-pay

## 2020-02-19 ENCOUNTER — Telehealth: Payer: Self-pay

## 2020-02-19 DIAGNOSIS — E119 Type 2 diabetes mellitus without complications: Secondary | ICD-10-CM

## 2020-02-19 NOTE — Chronic Care Management (AMB) (Signed)
  Chronic Care Management   Outreach Note  02/19/2020 Name: Sherri [name] MRN: [id number] DOB: 1966/09/06  Referred by: Arnette Felts, FNP Reason for referral : Care Coordination   SW placed a second unsuccessful outbound call to the patient to assist with care coordination needs. SW left a HIPAA compliant voice message requesting a return call.  Follow Up Plan: SW will outreach the patient over the next 10 days.  [name], BSW, CDP Social Worker, Certified Dementia Practitioner [facility] Care Management [phone]

## 2020-02-20 ENCOUNTER — Telehealth: Payer: Self-pay

## 2020-02-23 ENCOUNTER — Telehealth: Payer: Medicare Other

## 2020-02-23 ENCOUNTER — Other Ambulatory Visit: Payer: Self-pay

## 2020-02-23 ENCOUNTER — Ambulatory Visit: Payer: Self-pay

## 2020-02-23 DIAGNOSIS — J45909 Unspecified asthma, uncomplicated: Secondary | ICD-10-CM

## 2020-02-23 DIAGNOSIS — G2582 Stiff-man syndrome: Secondary | ICD-10-CM

## 2020-02-23 DIAGNOSIS — E119 Type 2 diabetes mellitus without complications: Secondary | ICD-10-CM

## 2020-02-24 ENCOUNTER — Ambulatory Visit: Payer: Self-pay

## 2020-02-24 DIAGNOSIS — J45909 Unspecified asthma, uncomplicated: Secondary | ICD-10-CM

## 2020-02-24 DIAGNOSIS — E119 Type 2 diabetes mellitus without complications: Secondary | ICD-10-CM

## 2020-02-24 NOTE — Chronic Care Management (AMB) (Signed)
Chronic Care Management    Social Work Follow Up Note  02/24/2020 Name: Sherri [name] MRN: [id number] DOB: 1966/07/09  Sherri [name] is a 54 y.o. year old female who is a primary care patient of Sherri Felts, FNP. The CCM team was consulted for assistance with care coordination.   Review of patient status, including review of consultants reports, other relevant assessments, and collaboration with appropriate care team members and the patient's provider was performed as part of comprehensive patient evaluation and provision of chronic care management services.    SDOH (Social Determinants of Health) assessments performed: No    Outpatient Encounter Medications as of 02/24/2020  Medication Sig Note  . albuterol (PROAIR HFA) 108 (90 Base) MCG/ACT inhaler Inhale 2 puffs into the lungs every 6 (six) hours as needed for wheezing or shortness of breath.   . Ascorbic Acid (VITAMIN C) 1000 MG tablet Take 1,000 mg by mouth daily.   . baclofen (LIORESAL) 20 MG tablet TAKE 1 AND 1/2 TABLETS BY  MOUTH IN THE MORNING AND IN THE EVENING AND 1 TABLET AT MIDDAY   . cetirizine (ZYRTEC) 10 MG tablet TAKE 1 TABLET BY MOUTH EVERY DAY   . Cholecalciferol (VITAMIN D3) 5000 units CAPS Take by mouth.   . Cranberry-Vitamin C-Vitamin E (CRANBERRY PLUS VITAMIN C) 4200-20-3 MG-MG-UNIT CAPS Take by mouth.   . cyanocobalamin (,VITAMIN B-12,) 1000 MCG/ML injection Inject 1,000 mcg into the muscle every 3 (three) months.    . desloratadine (CLARINEX) 5 MG tablet Take 1 tablet (5 mg total) by mouth daily.   . diazepam (VALIUM) 10 MG tablet Take 2 tablets (20 mg total) by mouth 3 (three) times daily.   . diphenhydrAMINE (BENADRYL) 25 MG tablet Take 25 mg by mouth at bedtime as needed for itching or allergies.  01/17/2017: Takes as needed  . EPINEPHRINE 0.3 mg/0.3 mL IJ SOAJ injection INJECT INTRAMUSCULARLY  0.3ML AS NEEDED FOR  ANAPHYLAXIS . SEEK MEDICAL  ATTENTION AFTER USE.   . famotidine (PEPCID) 20 MG tablet TAKE 1  TABLET BY MOUTH TWICE A DAY   . FLUoxetine (PROZAC) 10 MG capsule Take 1 capsule (10 mg total) by mouth daily.   [name] gabapentin (NEURONTIN) 300 MG capsule TAKE 1 CAPSULE BY MOUTH TWO TIMES DAILY   . glipiZIDE (GLUCOTROL) 5 MG tablet Take 5 mg by mouth daily before breakfast.   . Glucose Blood (ACCU-CHEK SIMPLICITY TEST STRP VI) by In Vitro route. Insert 1 by subcutaneous route 4 times every day check blood sugar before breakfast, lunch and dinner and bedtime   . hydrOXYzine (VISTARIL) 100 MG capsule TAKE 1 CAPSULE BY MOUTH 3  TIMES DAILY AS NEEDED FOR  ITCHING   . metFORMIN (GLUMETZA) 1000 MG (MOD) 24 hr tablet Take 1 tablet (1,000 mg total) by mouth 2 (two) times daily with a meal.   . Olopatadine HCl (PAZEO) 0.7 % SOLN Apply 1 drop to eye as needed.    . vitamin E (VITAMIN E) 400 UNIT capsule Take 400 Units by mouth daily.    No facility-administered encounter medications on file as of 02/24/2020.     Goals Addressed            This Visit's Progress     Patient Stated   . "I am running out food and need housing resources" (pt-stated)   On track    Current Barriers:  [name] Knowledge Deficits related to food and housing resources . Lacks caregiver support.  . Corporate treasurer.  . 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 Received ccommunication from [employer] indicating the patients meals were delivered late: Hello!   We would like to notify you that a client we have listed under your case load and their most recent delivery date attempt by a third party carrier was received late.   I have tried reaching out to the client to not consume the meals but have been unsuccessful. I do want to make you aware the  box was delivered to the client past the 48 hours we give our gel packs to keep the meals cold and we do not have a way to reach the client to make them aware to not consume the meals.    First Name: Sherri   Last Name: Sherri [name] ID:  Expected Delivery Date: 2/26 Phone Number: [phone]  . Successful outbound call to the patient to review above information SW received o The patient reports the meals were frozen upon delivery and she intends to eat them . Advised the patient Glucerna shakes are available for pick up from her primary providers office . Reviewed progress of housing goal o The patient reports she has received an e-mail from Sherri [name] but is unable to locate during today's call o Scheduled follow up call to review resource needs  Patient Self Care Activities:  . Patient verbalizes understanding of plan to f/u with her property manager regarding moving her into a clean apartment that is mold free . Self administers medications as prescribed . Attends all scheduled provider appointments . Calls pharmacy for medication refills . Performs ADL's independently . Performs IADL's independently . Calls provider office for new concerns or questions  Please see past updates related to this goal by clicking on the "Past Updates" button in the selected goal          Follow Up Plan: SW will follow up with patient by phone over the next 10 days   Daneen Schick, BSW, CDP Social Worker, Certified Dementia Practitioner [facility] [place] [facility] [phone]  Total time spent performing care coordination and/or care management activities with the patient by phone or face to face = 10 minutes.

## 2020-02-24 NOTE — Patient Instructions (Signed)
Social Worker Visit Information  Goals we discussed today:  Goals Addressed            This Visit's Progress     Patient Stated   . "I am running out food and need housing resources" (pt-stated)   On track    Current Barriers:  [name] Knowledge Deficits related to food and housing resources . Lacks caregiver support.  . Corporate treasurer.  . 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 Received ccommunication from [employer] indicating the patients meals were delivered late: Hello!   We would like to notify you that a client we have listed under your case load and their most recent delivery date attempt by a third party carrier was received late.   I have tried reaching out to the client to not consume the meals but have been unsuccessful. I do want to make you aware the box was delivered to the client past the 48 hours we give our gel packs to keep the meals cold and we do not have a way to reach the client to make them aware to not consume the meals.    First Name: Sherri   Last Name: Suman [name] ID:  Expected Delivery Date: 2/26 Phone Number: [phone]  . Successful outbound call to the patient to review above information SW received o The patient reports the meals were frozen upon delivery and she intends to eat them . Advised the patient Glucerna shakes are available for pick up from her primary providers office . Reviewed progress of housing goal o The patient reports she has received an e-mail from Mr. Verdis Frederickson but is unable to locate during today's call o Scheduled follow up call to review resource needs  Patient Self Care Activities:  . Patient verbalizes understanding of plan to f/u  with her property manager regarding moving her into a clean apartment that is mold free . Self administers medications as prescribed . Attends all scheduled provider appointments . Calls pharmacy for medication refills . Performs ADL's independently . Performs IADL's independently . Calls provider office for new concerns or questions  Please see past updates related to this goal by clicking on the "Past Updates" button in the selected goal          Follow Up Plan: SW will follow up with patient by phone over the next 10 days.   [name], BSW, CDP Social Worker, Certified Dementia Practitioner [facility] Care Management [phone]

## 2020-02-24 NOTE — Chronic Care Management (AMB) (Signed)
  Chronic Care Management   Outreach Note  02/24/2020 Name: Sherri [name] MRN: [id number] DOB: 09-Aug-1966  Referred by: Arnette Felts, FNP Reason for referral : Chronic Care Management (FU Call - DM, housing )   An unsuccessful telephone outreach was attempted today. The patient was referred to the case management team for assistance with care management and care coordination.   Follow Up Plan: A HIPPA compliant phone message was left for the patient providing contact information and requesting a return call.  Telephone follow up appointment with care management team member scheduled for: 03/02/20  Delsa Sale, RN, BSN, CCM Care Management Coordinator [facility] Care Management/[facility]  Direct Phone: [phone]

## 2020-02-27 ENCOUNTER — Inpatient Hospital Stay: Payer: Medicare Other

## 2020-02-27 ENCOUNTER — Inpatient Hospital Stay: Payer: Medicare Other | Admitting: Hematology & Oncology

## 2020-03-01 ENCOUNTER — Telehealth: Payer: Self-pay

## 2020-03-01 ENCOUNTER — Ambulatory Visit: Payer: Self-pay

## 2020-03-01 DIAGNOSIS — E119 Type 2 diabetes mellitus without complications: Secondary | ICD-10-CM

## 2020-03-01 NOTE — Chronic Care Management (AMB) (Signed)
  Chronic Care Management   Outreach Note  03/01/2020 Name: Tanasha Menees MRN: [id number] DOB: 1966/05/02  Referred by: Arnette Felts, FNP Reason for referral : Care Coordination   SW placed an unsuccessful outbound call to the patient to assist with care coordination needs. SW left a HIPAA compliant voice message requesting a return call.  Follow Up Plan: The care management team will reach out to the patient again over the next 14 days.   [name], BSW, CDP Social Worker, Certified Dementia Practitioner [facility] Care Management [phone]

## 2020-03-02 ENCOUNTER — Ambulatory Visit (INDEPENDENT_AMBULATORY_CARE_PROVIDER_SITE_OTHER): Payer: Medicare Other

## 2020-03-02 ENCOUNTER — Telehealth: Payer: Self-pay

## 2020-03-02 DIAGNOSIS — J45909 Unspecified asthma, uncomplicated: Secondary | ICD-10-CM

## 2020-03-02 DIAGNOSIS — E119 Type 2 diabetes mellitus without complications: Secondary | ICD-10-CM
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
## 2020-03-05 ENCOUNTER — Ambulatory Visit: Payer: Self-pay | Admitting: Pharmacist

## 2020-03-05 ENCOUNTER — Ambulatory Visit: Payer: Self-pay

## 2020-03-05 DIAGNOSIS — J45909 Unspecified asthma, uncomplicated: Secondary | ICD-10-CM

## 2020-03-05 DIAGNOSIS — E119 Type 2 diabetes mellitus without complications: Secondary | ICD-10-CM

## 2020-03-05 NOTE — Chronic Care Management (AMB) (Signed)
Chronic Care Management    Social Work Follow Up Note  03/05/2020 Name: Sherri [name] MRN: [id number] DOB: [birth year]  Sherri [name] is a 54 y.o. year old female who is a primary care patient of Arnette Felts, FNP. The CCM team was consulted for assistance with care coordination.   Review of patient status, including review of consultants reports, other relevant assessments, and collaboration with appropriate care team members and the patient's provider was performed as part of comprehensive patient evaluation and provision of chronic care management services.    SDOH (Social Determinants of Health) assessments performed: No    Outpatient Encounter Medications as of 03/05/2020  Medication Sig Note  . albuterol (PROAIR HFA) 108 (90 Base) MCG/ACT inhaler Inhale 2 puffs into the lungs every 6 (six) hours as needed for wheezing or shortness of breath.   . Ascorbic Acid (VITAMIN C) 1000 MG tablet Take 1,000 mg by mouth daily.   . baclofen (LIORESAL) 20 MG tablet TAKE 1 AND 1/2 TABLETS BY  MOUTH IN THE MORNING AND IN THE EVENING AND 1 TABLET AT MIDDAY   . cetirizine (ZYRTEC) 10 MG tablet TAKE 1 TABLET BY MOUTH EVERY DAY   . Cholecalciferol (VITAMIN D3) 5000 units CAPS Take by mouth.   . Cranberry-Vitamin C-Vitamin E (CRANBERRY PLUS VITAMIN C) 4200-20-3 MG-MG-UNIT CAPS Take by mouth.   . cyanocobalamin (,VITAMIN B-12,) 1000 MCG/ML injection Inject 1,000 mcg into the muscle every 3 (three) months.    . desloratadine (CLARINEX) 5 MG tablet Take 1 tablet (5 mg total) by mouth daily.   . diazepam (VALIUM) 10 MG tablet Take 2 tablets (20 mg total) by mouth 3 (three) times daily.   . diphenhydrAMINE (BENADRYL) 25 MG tablet Take 25 mg by mouth at bedtime as needed for itching or allergies.  01/17/2017: Takes as needed  . EPINEPHRINE 0.3 mg/0.3 mL IJ SOAJ injection INJECT INTRAMUSCULARLY  0.3ML AS NEEDED FOR  ANAPHYLAXIS . SEEK MEDICAL  ATTENTION AFTER USE.   . famotidine (PEPCID) 20 MG tablet TAKE 1  TABLET BY MOUTH TWICE A DAY   . FLUoxetine (PROZAC) 10 MG capsule Take 1 capsule (10 mg total) by mouth daily.   [name] gabapentin (NEURONTIN) 300 MG capsule TAKE 1 CAPSULE BY MOUTH TWO TIMES DAILY   . glipiZIDE (GLUCOTROL) 5 MG tablet Take 5 mg by mouth daily before breakfast.   . Glucose Blood (ACCU-CHEK SIMPLICITY TEST STRP VI) by In Vitro route. Insert 1 by subcutaneous route 4 times every day check blood sugar before breakfast, lunch and dinner and bedtime   . hydrOXYzine (VISTARIL) 100 MG capsule TAKE 1 CAPSULE BY MOUTH 3  TIMES DAILY AS NEEDED FOR  ITCHING   . metFORMIN (GLUMETZA) 1000 MG (MOD) 24 hr tablet Take 1 tablet (1,000 mg total) by mouth 2 (two) times daily with a meal.   . Olopatadine HCl (PAZEO) 0.7 % SOLN Apply 1 drop to eye as needed.    . vitamin E (VITAMIN E) 400 UNIT capsule Take 400 Units by mouth daily.    No facility-administered encounter medications on file as of 03/05/2020.     Goals Addressed            This Visit's Progress     Patient Stated   . "I am running out food and need housing resources" (pt-stated)       Current Barriers:  [name] Knowledge Deficits related to food and housing resources . Lacks caregiver support.  . Corporate treasurer.  . 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 Received inbound call from the patient who reports she was contacted by [employer] regarding home repair needs o Patient reports she spoke with "Yvone Neu" who informed her she would need a requisition under the doctor stating she could not live in a home with mold . Advised the patient SW would contact [employer] to determined information needed in requisition o Voice message left for  [employer], [employer] requesting a return call  Patient Self Care Activities:  . Patient verbalizes understanding of plan to f/u with her property manager regarding moving her into a clean apartment that is mold free . Self administers medications as prescribed . Attends all scheduled provider appointments . Calls pharmacy for medication refills . Performs ADL's independently . Performs IADL's independently . Calls provider office for new concerns or questions  Please see past updates related to this goal by clicking on the "Past Updates" button in the selected goal          Follow Up Plan: SW will follow up with patient by phone over the next week   Daneen Schick, BSW, CDP Social Worker, Certified Dementia Practitioner [place] [facility] [place] Management [phone]  Total time spent performing care coordination and/or care management activities with the patient by phone or face to face = 15 minutes.

## 2020-03-05 NOTE — Patient Instructions (Signed)
Social Worker Visit Information  Goals we discussed today:  Goals Addressed            This Visit's Progress     Patient Stated   . "I am running out food and need housing resources" (pt-stated)       Current Barriers:  [name] Knowledge Deficits related to food and housing resources . Lacks caregiver support.  . Corporate treasurer.  . 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 Received inbound call from the patient who reports she was contacted by [employer] regarding home repair needs o Patient reports she spoke with "Rocky Link" who informed her she would need a requisition under the doctor stating she could not live in a home with mold . Advised the patient SW would contact [employer] to determined information needed in requisition o Voice message left for [employer], [employer] requesting a return call  Patient Self Care Activities:  . Patient verbalizes understanding of plan to f/u with her property manager regarding moving her into a clean apartment that is mold free . Self administers medications as prescribed . Attends all scheduled provider appointments . Calls pharmacy for medication refills . Performs ADL's independently . Performs IADL's independently . Calls provider office for new concerns or questions  Please see past updates related to this goal by clicking on the "Past Updates" button in the selected goal          Follow Up Plan: SW will follow up with patient by phone over the next week.  [name], BSW, CDP Social Worker, Certified Dementia Practitioner [facility] Care Management [phone]

## 2020-03-08 ENCOUNTER — Ambulatory Visit: Payer: Self-pay

## 2020-03-08 DIAGNOSIS — J45909 Unspecified asthma, uncomplicated: Secondary | ICD-10-CM

## 2020-03-09 ENCOUNTER — Ambulatory Visit: Payer: Self-pay

## 2020-03-09 DIAGNOSIS — E119 Type 2 diabetes mellitus without complications: Secondary | ICD-10-CM

## 2020-03-09 DIAGNOSIS — J45909 Unspecified asthma, uncomplicated: Secondary | ICD-10-CM

## 2020-03-09 NOTE — Patient Instructions (Signed)
Social Worker Visit Information  Goals we discussed today:  Goals Addressed            This Visit's Progress     Patient Stated   . "I am running out food and need housing resources" (pt-stated)       Current Barriers:  [name] Knowledge Deficits related to food and housing resources . Lacks caregiver support.  . Corporate treasurer.  . 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 Successful outbound call placed to the patient in response to a voice message requesting assistance . Determined the patient she has received a letter from her apartment complex stating she will be evicted on the 17th due to lack of payment o The patient reports she has paid her rent on time and has receipts o Discussed the patient has paid extra in rent the last three month to catch up on payment owed for pest control earlier this year o The patient has contacted the corporate office and is awaiting a response regarding reason her rent is showing overdue . Advised the patient to contact [employer] regarding notice and to identify resources to assist . Received communication from the patient informing SW someone arrived to begin removing mold from her apartment . Advised the patient to keep SW informed of outcome of eviction notice  Patient Self Care Activities:  . Patient verbalizes understanding of plan to f/u with her property manager regarding moving her into a clean apartment that is mold free . Self administers medications as prescribed . Attends all scheduled provider appointments . Calls pharmacy for medication refills . Performs ADL's independently . Performs IADL's independently . Calls provider office for new concerns  or questions  Please see past updates related to this goal by clicking on the "Past Updates" button in the selected goal          Follow Up Plan: SW will follow up with patient by phone over the next week.   [name], BSW, CDP Social Worker, Certified Dementia Practitioner [facility] Care Management [phone]

## 2020-03-09 NOTE — Chronic Care Management (AMB) (Signed)
Chronic Care Management    Social Work Follow Up Note  03/08/2020 Name: Sherri [name] MRN: [id number] DOB: 1966-05-30  Sherri [name] is a 54 y.o. year old female who is a primary care patient of Minette Brine, [place]. The CCM team was consulted for assistance with care coordination.   Review of patient status, including review of consultants reports, other relevant assessments, and collaboration with appropriate care team members and the patient's provider was performed as part of comprehensive patient evaluation and provision of chronic care management services.    SDOH (Social Determinants of Health) assessments performed: No    Outpatient Encounter Medications as of 03/08/2020  Medication Sig Note  . albuterol (PROAIR HFA) 108 (90 Base) MCG/ACT inhaler Inhale 2 puffs into the lungs every 6 (six) hours as needed for wheezing or shortness of breath.   . Ascorbic Acid (VITAMIN C) 1000 MG tablet Take 1,000 mg by mouth daily.   . baclofen (LIORESAL) 20 MG tablet TAKE 1 AND 1/2 TABLETS BY  MOUTH IN THE MORNING AND IN THE EVENING AND 1 TABLET AT MIDDAY   . cetirizine (ZYRTEC) 10 MG tablet TAKE 1 TABLET BY MOUTH EVERY DAY   . Cholecalciferol (VITAMIN D3) 5000 units CAPS Take by mouth.   . Cranberry-Vitamin C-Vitamin E (CRANBERRY PLUS VITAMIN C) 4200-20-3 MG-MG-UNIT CAPS Take by mouth.   . cyanocobalamin (,VITAMIN B-12,) 1000 MCG/ML injection Inject 1,000 mcg into the muscle every 3 (three) months.    . desloratadine (CLARINEX) 5 MG tablet Take 1 tablet (5 mg total) by mouth daily.   . diazepam (VALIUM) 10 MG tablet Take 2 tablets (20 mg total) by mouth 3 (three) times daily.   . diphenhydrAMINE (BENADRYL) 25 MG tablet Take 25 mg by mouth at bedtime as needed for itching or allergies.  01/17/2017: Takes as needed  . EPINEPHRINE 0.3 mg/0.3 mL IJ SOAJ injection INJECT INTRAMUSCULARLY  0.3ML AS NEEDED FOR  ANAPHYLAXIS . SEEK MEDICAL  ATTENTION AFTER USE.   . famotidine (PEPCID) 20 MG tablet TAKE 1  TABLET BY MOUTH TWICE A DAY   . FLUoxetine (PROZAC) 10 MG capsule Take 1 capsule (10 mg total) by mouth daily.   [name] gabapentin (NEURONTIN) 300 MG capsule TAKE 1 CAPSULE BY MOUTH TWO TIMES DAILY   . glipiZIDE (GLUCOTROL) 5 MG tablet Take 5 mg by mouth daily before breakfast.   . Glucose Blood (ACCU-CHEK SIMPLICITY TEST STRP VI) by In Vitro route. Insert 1 by subcutaneous route 4 times every day check blood sugar before breakfast, lunch and dinner and bedtime   . hydrOXYzine (VISTARIL) 100 MG capsule TAKE 1 CAPSULE BY MOUTH 3  TIMES DAILY AS NEEDED FOR  ITCHING   . metFORMIN (GLUMETZA) 1000 MG (MOD) 24 hr tablet Take 1 tablet (1,000 mg total) by mouth 2 (two) times daily with a meal.   . Olopatadine HCl (PAZEO) 0.7 % SOLN Apply 1 drop to eye as needed.    . vitamin E (VITAMIN E) 400 UNIT capsule Take 400 Units by mouth daily.    No facility-administered encounter medications on file as of 03/08/2020.     Goals Addressed            This Visit's Progress     Patient Stated   . "I am running out food and need housing resources" (pt-stated)       Current Barriers:  [name] Knowledge Deficits related to food and housing resources . Lacks caregiver support.  . Film/video editor.  . 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 Successful outbound call placed to the patient in response to a voice message requesting assistance . Determined the patient she has received a letter from her apartment complex stating she will be evicted on the 17th due to lack of payment o The patient reports she has paid her rent on time and has receipts o Discussed the patient has paid extra in rent the last three month to catch up on payment owed for pest  control earlier this year o The patient has contacted the corporate office and is awaiting a response regarding reason her rent is showing overdue . Advised the patient to contact [employer] regarding notice and to identify resources to assist . Received communication from the patient informing SW someone arrived to begin removing mold from her apartment . Advised the patient to keep SW informed of outcome of eviction notice  Patient Self Care Activities:  . Patient verbalizes understanding of plan to f/u with her property manager regarding moving her into a clean apartment that is mold free . Self administers medications as prescribed . Attends all scheduled provider appointments . Calls pharmacy for medication refills . Performs ADL's independently . Performs IADL's independently . Calls provider office for new concerns or questions  Please see past updates related to this goal by clicking on the "Past Updates" button in the selected goal          Follow Up Plan: SW will follow up with patient by phone over the next week.   [name], BSW, CDP Social Worker, Certified Dementia Practitioner [facility] Care Management [phone]  Total time spent performing care coordination and/or care management activities with the patient by phone or face to face = 22 minutes.

## 2020-03-10 ENCOUNTER — Inpatient Hospital Stay: Payer: Medicare Other

## 2020-03-10 ENCOUNTER — Other Ambulatory Visit: Payer: Self-pay

## 2020-03-10 ENCOUNTER — Ambulatory Visit: Payer: Self-pay

## 2020-03-10 ENCOUNTER — Inpatient Hospital Stay (HOSPITAL_BASED_OUTPATIENT_CLINIC_OR_DEPARTMENT_OTHER): Payer: Medicare Other | Admitting: Hematology & Oncology

## 2020-03-10 ENCOUNTER — Inpatient Hospital Stay: Payer: Medicare Other | Attending: Hematology & Oncology

## 2020-03-10 ENCOUNTER — Encounter: Payer: Self-pay | Admitting: Hematology & Oncology

## 2020-03-10 VITALS — BP 106/86 | HR 96 | Temp 97.7°F | Resp 20 | Wt 106.0 lb

## 2020-03-10 DIAGNOSIS — D510 Vitamin B12 deficiency anemia due to intrinsic factor deficiency: Secondary | ICD-10-CM | POA: Diagnosis not present

## 2020-03-10 DIAGNOSIS — D509 Iron deficiency anemia, unspecified: Secondary | ICD-10-CM | POA: Insufficient documentation

## 2020-03-10 DIAGNOSIS — E119 Type 2 diabetes mellitus without complications: Secondary | ICD-10-CM

## 2020-03-10 DIAGNOSIS — Z7984 Long term (current) use of oral hypoglycemic drugs: Secondary | ICD-10-CM | POA: Insufficient documentation

## 2020-03-10 DIAGNOSIS — Z86718 Personal history of other venous thrombosis and embolism: Secondary | ICD-10-CM | POA: Diagnosis not present

## 2020-03-10 DIAGNOSIS — D500 Iron deficiency anemia secondary to blood loss (chronic): Secondary | ICD-10-CM | POA: Diagnosis not present

## 2020-03-10 DIAGNOSIS — Z79899 Other long term (current) drug therapy: Secondary | ICD-10-CM | POA: Diagnosis not present

## 2020-03-10 DIAGNOSIS — J45909 Unspecified asthma, uncomplicated: Secondary | ICD-10-CM

## 2020-03-10 DIAGNOSIS — D508 Other iron deficiency anemias: Secondary | ICD-10-CM

## 2020-03-10 LAB — CMP (CANCER CENTER ONLY)
ALT: 12 U/L (ref 0–44)
AST: 12 U/L — ABNORMAL LOW (ref 15–41)
Albumin: 4.8 g/dL (ref 3.5–5.0)
Alkaline Phosphatase: 60 U/L (ref 38–126)
Anion gap: 7 (ref 5–15)
BUN: 16 mg/dL (ref 6–20)
CO2: 31 mmol/L (ref 22–32)
Calcium: 10.7 mg/dL — ABNORMAL HIGH (ref 8.9–10.3)
Chloride: 105 mmol/L (ref 98–111)
Creatinine: 1.26 mg/dL — ABNORMAL HIGH (ref 0.44–1.00)
GFR, Est AFR Am: 56 mL/min — ABNORMAL LOW (ref >60)
GFR, Estimated: 48 mL/min — ABNORMAL LOW (ref >60)
Glucose, Bld: 84 mg/dL (ref 70–99)
Potassium: 4.9 mmol/L (ref 3.5–5.1)
Sodium: 143 mmol/L (ref 135–145)
Total Bilirubin: 0.3 mg/dL (ref 0.3–1.2)
Total Protein: 7.3 g/dL (ref 6.5–8.1)

## 2020-03-10 LAB — CBC WITH DIFFERENTIAL (CANCER CENTER ONLY)
Abs Immature Granulocytes: 0.05 10*3/uL (ref 0.00–0.07)
Basophils Absolute: 0 10*3/uL (ref 0.0–0.1)
Basophils Relative: 0 %
Eosinophils Absolute: 0 10*3/uL (ref 0.0–0.5)
Eosinophils Relative: 0 %
HCT: 37.4 % (ref 36.0–46.0)
Hemoglobin: 11.8 g/dL — ABNORMAL LOW (ref 12.0–15.0)
Immature Granulocytes: 1 %
Lymphocytes Relative: 30 %
Lymphs Abs: 2.2 10*3/uL (ref 0.7–4.0)
MCH: 27.8 pg (ref 26.0–34.0)
MCHC: 31.6 g/dL (ref 30.0–36.0)
MCV: 88 fL (ref 80.0–100.0)
Monocytes Absolute: 0.4 10*3/uL (ref 0.1–1.0)
Monocytes Relative: 6 %
Neutro Abs: 4.7 10*3/uL (ref 1.7–7.7)
Neutrophils Relative %: 63 %
Platelet Count: 297 10*3/uL (ref 150–400)
RBC: 4.25 MIL/uL (ref 3.87–5.11)
RDW: 12.7 % (ref 11.5–15.5)
WBC Count: 7.4 10*3/uL (ref 4.0–10.5)
nRBC: 0 % (ref 0.0–0.2)

## 2020-03-10 LAB — IRON AND TIBC
Iron: 79 ug/dL (ref 41–142)
Saturation Ratios: 31 % (ref 21–57)
TIBC: 254 ug/dL (ref 236–444)
UIBC: 175 ug/dL (ref 120–384)

## 2020-03-10 LAB — RETICULOCYTES
Immature Retic Fract: 7.4 % (ref 2.3–15.9)
RBC.: 4.24 MIL/uL (ref 3.87–5.11)
Retic Count, Absolute: 54.3 10*3/uL (ref 19.0–186.0)
Retic Ct Pct: 1.3 % (ref 0.4–3.1)

## 2020-03-10 LAB — VITAMIN B12: Vitamin B-12: 272 pg/mL (ref 180–914)

## 2020-03-10 LAB — FERRITIN: Ferritin: 733 ng/mL — ABNORMAL HIGH (ref 11–307)

## 2020-03-10 MED ORDER — CYANOCOBALAMIN 1000 MCG/ML IJ SOLN
1000.0000 ug | Freq: Once | INTRAMUSCULAR | Status: AC
  Administered 2020-03-10: 1000 ug via INTRAMUSCULAR

## 2020-03-10 MED ORDER — CYANOCOBALAMIN 1000 MCG/ML IJ SOLN
INTRAMUSCULAR | Status: AC
  Filled 2020-03-10: qty 1

## 2020-03-10 NOTE — Progress Notes (Signed)
Hematology and Oncology Follow Up Visit  Sherri [name] [id number] 12-Apr-1966 54 y.o. 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  Unfortunately, she really has had a tough time at home.  She apparently had a busted hot water pump.  This subsequently cause a lot of mold to form into her apartment.  The apartment complex has been very slow in trying to alleviate this and cleaned it up..  She has asthma.  She has lung problems.  She is on inhalers.  Her weight continues to go down.  All the stress that she is under is certainly not helping this.  She has had no nausea or vomiting.  She had no diarrhea.  I did give her a bag of food from our food pantry.  She was incredibly grateful for this.  I think part of the problem that she had food stamps but is somehow is not getting them any longer.  When we last saw her back in July 2020, her ferritin was 601 with an iron saturation of 43%.  There is been no tingling in her hands or feet.  She is diabetic.  She has an increased dose of Metformin.  Currently, I would say her performance status is probably ECOG 2.    Medications:  Allergies as of 03/10/2020      Reactions   Ibuprofen Other (See Comments)   Does not take due to hx of renal insufficiency "I have kidney disease"    Lemon Flavor Swelling   Severe Lip Swelling  FRUIT per pt.     Amoxicillin Diarrhea, Other (See Comments)   Tylenol [acetaminophen] Hives   Cannot take large quantities      Medication List       Accurate as of March 10, 2020 11:17 AM. If you have any questions, ask your nurse or doctor.        ACCU-CHEK SIMPLICITY TEST STRP VI by In Vitro route. Insert 1 by subcutaneous route 4 times  every day check blood sugar before breakfast, lunch and dinner and bedtime   albuterol 108 (90 Base) MCG/ACT inhaler Commonly known as: ProAir HFA Inhale 2 puffs into the lungs every 6 (six) hours as needed for wheezing or shortness of breath.   baclofen 20 MG tablet Commonly known as: LIORESAL TAKE 1 AND 1/2 TABLETS BY  MOUTH IN THE MORNING AND IN THE EVENING AND 1 TABLET AT MIDDAY   cetirizine 10 MG tablet Commonly known as: ZYRTEC TAKE 1 TABLET BY MOUTH EVERY DAY   Cranberry Plus Vitamin C 4200-20-3 MG-MG-UNIT Caps Generic drug: Cranberry-Vitamin C-Vitamin E Take by mouth.   cyanocobalamin 1000 MCG/ML injection Commonly known as: (VITAMIN B-12) Inject 1,000 mcg into the muscle every 3 (three) months.   desloratadine 5 MG tablet Commonly known as: CLARINEX Take 1 tablet (5 mg total) by mouth daily.   diazepam 10 MG tablet Commonly known as: VALIUM Take 2 tablets (20 mg total) by mouth 3 (three) times daily.   diphenhydrAMINE 25 MG tablet Commonly known as: BENADRYL Take 25 mg by mouth at bedtime as needed for itching or allergies.   EPINEPHrine 0.3 mg/0.3 mL Soaj injection Commonly known as: EPI-PEN INJECT INTRAMUSCULARLY  0.3ML AS NEEDED FOR  ANAPHYLAXIS .  SEEK MEDICAL  ATTENTION AFTER USE.   famotidine 20 MG tablet Commonly known as: PEPCID TAKE 1 TABLET BY MOUTH TWICE A DAY   FLUoxetine 10 MG capsule Commonly known as: PROZAC Take 1 capsule (10 mg total) by mouth daily.   gabapentin 300 MG capsule Commonly known as: NEURONTIN TAKE 1 CAPSULE BY MOUTH TWO TIMES DAILY   glipiZIDE 5 MG tablet Commonly known as: GLUCOTROL Take 5 mg by mouth daily before breakfast.   hydrOXYzine 100 MG capsule Commonly known as: VISTARIL TAKE 1 CAPSULE BY MOUTH 3  TIMES DAILY AS NEEDED FOR  ITCHING   metFORMIN 500 MG tablet Commonly known as: GLUCOPHAGE Take 1,000 mg by mouth 2 (two) times daily. What changed: Another medication with the same name was removed. Continue  taking this medication, and follow the directions you see here. Changed by: Volanda Napoleon, MD   Pazeo 0.7 % Soln Generic drug: Olopatadine HCl Apply 1 drop to eye as needed.   vitamin C 1000 MG tablet Take 1,000 mg by mouth daily.   Vitamin D3 125 MCG (5000 UT) Caps Take by mouth.   vitamin E 180 MG (400 UNITS) capsule Generic drug: vitamin E Take 400 Units by mouth daily.       Allergies:  Allergies  Allergen Reactions  . Ibuprofen Other (See Comments)    Does not take due to hx of renal insufficiency "I have kidney disease"   . Lemon Flavor Swelling    Severe Lip Swelling  FRUIT per pt.    . Amoxicillin Diarrhea and Other (See Comments)  . Tylenol [Acetaminophen] Hives    Cannot take large quantities    Past Medical History, Surgical history, Social history, and Family History were reviewed and updated.  Review of Systems: Review of Systems  Constitutional: Positive for malaise/fatigue and weight loss.  HENT: Positive for congestion.   Eyes: Negative.   Respiratory: Positive for cough, shortness of breath and wheezing.   Cardiovascular: Negative.   Gastrointestinal: Positive for nausea.  Genitourinary: Negative.   Musculoskeletal: Negative.   Neurological: Negative.   Endo/Heme/Allergies: Negative.   Psychiatric/Behavioral: Negative.      Physical Exam:  weight is 106 lb (48.1 kg). Her temporal temperature is 97.7 F (36.5 C). Her blood pressure is 106/86 and her pulse is 96. Her respiration is 20 and oxygen saturation is 100%.   Wt Readings from Last 3 Encounters:  03/10/20 106 lb (48.1 kg)  02/16/20 102 lb 3.2 oz (46.4 kg)  01/15/20 113 lb (51.3 kg)    Physical Exam Vitals reviewed.  HENT:     Head: Normocephalic and atraumatic.  Eyes:     Pupils: Pupils are equal, round, and reactive to light.  Cardiovascular:     Rate and Rhythm: Normal rate and regular rhythm.     Heart sounds: Normal heart sounds.  Pulmonary:     Effort: Pulmonary  effort is normal.     Breath sounds: Normal breath sounds.  Abdominal:     General: Bowel sounds are normal.     Palpations: Abdomen is soft.  Musculoskeletal:        General: No tenderness or deformity. Normal range of motion.     Cervical back: Normal range of motion.  Lymphadenopathy:     Cervical: No cervical adenopathy.  Skin:    General: Skin is warm and dry.     Findings: No erythema or rash.  Neurological:     Mental Status: She is alert and oriented to  person, place, and time.  Psychiatric:        Behavior: Behavior normal.        Thought Content: Thought content normal.        Judgment: Judgment normal.       Lab Results  Component Value Date   WBC 7.4 03/10/2020   HGB 11.8 (L) 03/10/2020   HCT 37.4 03/10/2020   MCV 88.0 03/10/2020   PLT 297 03/10/2020   Lab Results  Component Value Date   FERRITIN 601 (H) 07/04/2019   IRON 106 07/04/2019   TIBC 250 07/04/2019   UIBC 144 07/04/2019   IRONPCTSAT 43 07/04/2019   Lab Results  Component Value Date   RETICCTPCT 1.3 03/10/2020   RBC 4.24 03/10/2020   RETICCTABS 71.8 12/14/2015   Lab Results  Component Value Date   KPAFRELGTCHN 1.06 08/02/2011   LAMBDASER 1.49 08/02/2011   KAPLAMBRATIO 0.71 08/02/2011   Lab Results  Component Value Date   IGGSERUM 1180 08/02/2011   IGA 206 08/02/2011   IGMSERUM 198 08/02/2011   Lab Results  Component Value Date   TOTALPROTELP 6.6 08/02/2011   ALBUMINELP 57.1 08/02/2011   A1GS 4.1 08/02/2011   A2GS 9.6 08/02/2011   BETS 6.1 08/02/2011   BETA2SER 5.9 08/02/2011   GAMS 17.2 08/02/2011   MSPIKE NOT DET 08/02/2011   SPEI * 08/02/2011     Chemistry      Component Value Date/Time   NA 143 03/10/2020 1014   NA 141 01/15/2020 1212   NA 136 11/23/2017 1147   NA 144 03/22/2017 0943   K 4.9 03/10/2020 1014   K 3.9 11/23/2017 1147   K 4.2 03/22/2017 0943   CL 105 03/10/2020 1014   CL 94 (L) 11/23/2017 1147   CO2 31 03/10/2020 1014   CO2 30 11/23/2017 1147   CO2  29 03/22/2017 0943   BUN 16 03/10/2020 1014   BUN 13 01/15/2020 1212   BUN 10 11/23/2017 1147   BUN 11.1 03/22/2017 0943   CREATININE 1.26 (H) 03/10/2020 1014   CREATININE 1.0 11/23/2017 1147   CREATININE 1.0 03/22/2017 0943   GLU 88 09/05/2018 1535      Component Value Date/Time   CALCIUM 10.7 (H) 03/10/2020 1014   CALCIUM 10.1 11/23/2017 1147   CALCIUM 9.9 03/22/2017 0943   ALKPHOS 60 03/10/2020 1014   ALKPHOS 174 (H) 11/23/2017 1147   ALKPHOS 114 03/22/2017 0943   AST 12 (L) 03/10/2020 1014   AST 18 03/22/2017 0943   ALT 12 03/10/2020 1014   ALT 46 11/23/2017 1147   ALT 23 03/22/2017 0943   BILITOT 0.3 03/10/2020 1014   BILITOT 0.24 03/22/2017 0943       Impression and Plan: Ms. Defibaugh is a pleasant 54 yo African American female with both B 12 and intermittent iron deficiency. She is symptomatic with fatigued.   Again, the weight loss is really troublesome.  I have to go back and look but I think she probably has weighed 30 or 40 pounds more in the past.  Hopefully, the groceries that we gave her will help.  Hopefully, getting the mold out of her apartment will help.  It has been a real struggle for her.  We will probably get a follow her little bit more closely.  She will get her B12 injection today.  We will have her come back in about 3 months.   Josph Macho, MD 3/17/202111:17 AM

## 2020-03-10 NOTE — Chronic Care Management (AMB) (Signed)
Chronic Care Management    Social Work Follow Up Note  03/09/2020 Name: Sherri [name] MRN: [id number] DOB: November 18, 1966  Sherri [name] is a 54 y.o. year old female who is a primary care patient of Arnette Felts, FNP. The CCM team was consulted for assistance with care coordination.   Review of patient status, including review of consultants reports, other relevant assessments, and collaboration with appropriate care team members and the patient's provider was performed as part of comprehensive patient evaluation and provision of chronic care management services.    SDOH (Social Determinants of Health) assessments performed: No    Outpatient Encounter Medications as of 03/09/2020  Medication Sig Note  . albuterol (PROAIR HFA) 108 (90 Base) MCG/ACT inhaler Inhale 2 puffs into the lungs every 6 (six) hours as needed for wheezing or shortness of breath.   . Ascorbic Acid (VITAMIN C) 1000 MG tablet Take 1,000 mg by mouth daily.   . baclofen (LIORESAL) 20 MG tablet TAKE 1 AND 1/2 TABLETS BY  MOUTH IN THE MORNING AND IN THE EVENING AND 1 TABLET AT MIDDAY   . cetirizine (ZYRTEC) 10 MG tablet TAKE 1 TABLET BY MOUTH EVERY DAY   . Cholecalciferol (VITAMIN D3) 5000 units CAPS Take by mouth.   . Cranberry-Vitamin C-Vitamin E (CRANBERRY PLUS VITAMIN C) 4200-20-3 MG-MG-UNIT CAPS Take by mouth.   . cyanocobalamin (,VITAMIN B-12,) 1000 MCG/ML injection Inject 1,000 mcg into the muscle every 3 (three) months.    . desloratadine (CLARINEX) 5 MG tablet Take 1 tablet (5 mg total) by mouth daily.   . diazepam (VALIUM) 10 MG tablet Take 2 tablets (20 mg total) by mouth 3 (three) times daily.   . diphenhydrAMINE (BENADRYL) 25 MG tablet Take 25 mg by mouth at bedtime as needed for itching or allergies.  01/17/2017: Takes as needed  . EPINEPHRINE 0.3 mg/0.3 mL IJ SOAJ injection INJECT INTRAMUSCULARLY  0.3ML AS NEEDED FOR  ANAPHYLAXIS . SEEK MEDICAL  ATTENTION AFTER USE. (Patient not taking: Reported on 03/10/2020)     . famotidine (PEPCID) 20 MG tablet TAKE 1 TABLET BY MOUTH TWICE A DAY   . FLUoxetine (PROZAC) 10 MG capsule Take 1 capsule (10 mg total) by mouth daily.   [name] gabapentin (NEURONTIN) 300 MG capsule TAKE 1 CAPSULE BY MOUTH TWO TIMES DAILY   . glipiZIDE (GLUCOTROL) 5 MG tablet Take 5 mg by mouth daily before breakfast.   . Glucose Blood (ACCU-CHEK SIMPLICITY TEST STRP VI) by In Vitro route. Insert 1 by subcutaneous route 4 times every day check blood sugar before breakfast, lunch and dinner and bedtime   . hydrOXYzine (VISTARIL) 100 MG capsule TAKE 1 CAPSULE BY MOUTH 3  TIMES DAILY AS NEEDED FOR  ITCHING   . Olopatadine HCl (PAZEO) 0.7 % SOLN Apply 1 drop to eye as needed.    . vitamin E (VITAMIN E) 400 UNIT capsule Take 400 Units by mouth daily.   . [DISCONTINUED] metFORMIN (GLUMETZA) 1000 MG (MOD) 24 hr tablet Take 1 tablet (1,000 mg total) by mouth 2 (two) times daily with a meal.    No facility-administered encounter medications on file as of 03/09/2020.     Goals Addressed            This Visit's Progress     Patient Stated   . "I am running out food and need housing resources" (pt-stated)       Current Barriers:  [name] Knowledge Deficits related to food and housing resources . Lacks caregiver support.  [name]  Film/video editor.  . 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 Collaboration with Sherri [name] from [employer] who requests a letter written on behalf of the patient to request a reasonable accomodation until the patients apartment has proper mold re-mediation o Sherri [name] reports speaking with the patient prior to contacting SW and concern surrounding mold spores in the patient home . Collaboration with Sherri Brine,  FNP regarding request for letter . Communication with patient regarding plan to request a reasonable accomodation o Provided Sherri [name] with e-mail contacts for the patient, the housing coalition, as well as the apartment Freight forwarder . Confirmed letter was scanned to all parties e-mail address on 03/09/2020 at 5:31 pm . Determined the patient has an appointment to see Dr. Marin Olp on 03/10/2020 with plans for her sister to provide transportation o Encouraged the patient to go by her primary providers office to obtain Glucerna samples  Patient Self Care Activities:  . Patient verbalizes understanding of plan to f/u with her property manager regarding moving her into a clean apartment that is mold free . Self administers medications as prescribed . Attends all scheduled provider appointments . Calls pharmacy for medication refills . Performs ADL's independently . Performs IADL's independently . Calls provider office for new concerns or questions  Please see past updates related to this goal by clicking on the "Past Updates" button in the selected goal          Follow Up Plan: SW will follow up with patient by phone over the next two days.  Daneen Schick, BSW, CDP Social Worker, Certified Dementia Practitioner [place] [facility] [place] [facility] [phone]  Total time spent performing care coordination and/or care management activities with the patient by phone or face to face = 35 minutes.

## 2020-03-10 NOTE — Patient Instructions (Signed)
Social Worker Visit Information  Goals we discussed today:  Goals Addressed            This Visit's Progress     Patient Stated   . "I am running out food and need housing resources" (pt-stated)       Current Barriers:  [name] Knowledge Deficits related to food and housing resources . Lacks caregiver support.  . Corporate treasurer.  . 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 Collaboration with Rocky Link from [employer] who requests a letter written on behalf of the patient to request a reasonable accomodation until the patients apartment has proper mold re-mediation o Rocky Link reports speaking with the patient prior to contacting SW and concern surrounding mold spores in the patient home . Collaboration with Arnette Felts, FNP regarding request for letter . Communication with patient regarding plan to request a reasonable accomodation o Provided Mrs. Moore with e-mail contacts for the patient, the housing coalition, as well as the apartment Production designer, theatre/television/film . Confirmed letter was scanned to all parties e-mail address on 03/09/2020 at 5:31 pm . Determined the patient has an appointment to see Dr. Myna Hidalgo on 03/10/2020 with plans for her sister to provide transportation o Encouraged the patient to go by her primary providers office to obtain Glucerna samples  Patient Self Care Activities:  . Patient verbalizes understanding of plan to f/u with her property manager regarding moving her into a clean apartment that is mold free . Self administers medications as prescribed . Attends all scheduled provider appointments . Calls pharmacy for medication refills . Performs ADL's independently . Performs IADL's independently . Calls  provider office for new concerns or questions  Please see past updates related to this goal by clicking on the "Past Updates" button in the selected goal          Follow Up Plan: SW will follow up with patient by phone over the next two days.   [name], BSW, CDP Social Worker, Certified Dementia Practitioner [facility] Care Management [phone]

## 2020-03-10 NOTE — Patient Instructions (Signed)
Social Worker Visit Information  Goals we discussed today:  Goals Addressed            This Visit's Progress     Patient Stated   . "I am running out food and need housing resources" (pt-stated)       Current Barriers:  [name] Knowledge Deficits related to food and housing resources . Lacks caregiver support.  . Corporate treasurer.  . 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 Successful outbound call to the patient who confirms she has obtained Glucerna samples from provider office o Patient also reports receiving a bag of food from Dr. Gustavo Lah office to assist with food insecurity . Determined the patient has yet to receive communication from her apartment manager regarding letter sent requesting reasonable accommodations . Informed by the patient she is still trying to send her ledger to [facility] with [employer] . Encouraged the patient to contact SW once she finds more out about mold remediation   Patient Self Care Activities:  . Patient verbalizes understanding of plan to f/u with her property manager regarding moving her into a clean apartment that is mold free . Self administers medications as prescribed . Attends all scheduled provider appointments . Calls pharmacy for medication refills . Performs ADL's independently . Performs IADL's independently . Calls provider office for new concerns or questions  Please see past updates related to this goal by clicking on the "Past Updates" button in the selected goal          Follow Up Plan: SW will follow up with patient by phone over the next week.   [name], BSW, CDP Social Worker, Certified Dementia Practitioner [facility] Care  Management [phone]

## 2020-03-10 NOTE — Patient Instructions (Signed)

## 2020-03-10 NOTE — Chronic Care Management (AMB) (Signed)
Chronic Care Management    Social Work Follow Up Note  03/10/2020 Name: Sherri [name] MRN: [id number] DOB: October 18, 1966  Sherri [name] is a 54 y.o. year old female who is a primary care patient of Sherri [name], Sherri [name]. The CCM team was consulted for assistance with care coordination.   Review of patient status, including review of consultants reports, other relevant assessments, and collaboration with appropriate care team members and the patient's provider was performed as part of comprehensive patient evaluation and provision of chronic care management services.    SDOH (Social Determinants of Health) assessments performed: No    Outpatient Encounter Medications as of 03/10/2020  Medication Sig Note  . albuterol (PROAIR HFA) 108 (90 Base) MCG/ACT inhaler Inhale 2 puffs into the lungs every 6 (six) hours as needed for wheezing or shortness of breath.   . Ascorbic Acid (VITAMIN C) 1000 MG tablet Take 1,000 mg by mouth daily.   . baclofen (LIORESAL) 20 MG tablet TAKE 1 AND 1/2 TABLETS BY  MOUTH IN THE MORNING AND IN THE EVENING AND 1 TABLET AT MIDDAY   . cetirizine (ZYRTEC) 10 MG tablet TAKE 1 TABLET BY MOUTH EVERY DAY   . Cholecalciferol (VITAMIN D3) 5000 units CAPS Take by mouth.   . Cranberry-Vitamin C-Vitamin E (CRANBERRY PLUS VITAMIN C) 4200-20-3 MG-MG-UNIT CAPS Take by mouth.   . cyanocobalamin (,VITAMIN B-12,) 1000 MCG/ML injection Inject 1,000 mcg into the muscle every 3 (three) months.    . desloratadine (CLARINEX) 5 MG tablet Take 1 tablet (5 mg total) by mouth daily.   . diazepam (VALIUM) 10 MG tablet Take 2 tablets (20 mg total) by mouth 3 (three) times daily.   . diphenhydrAMINE (BENADRYL) 25 MG tablet Take 25 mg by mouth at bedtime as needed for itching or allergies.  01/17/2017: Takes as needed  . EPINEPHRINE 0.3 mg/0.3 mL IJ SOAJ injection INJECT INTRAMUSCULARLY  0.3ML AS NEEDED FOR  ANAPHYLAXIS . SEEK MEDICAL  ATTENTION AFTER USE. (Patient not taking: Reported on 03/10/2020)     . famotidine (PEPCID) 20 MG tablet TAKE 1 TABLET BY MOUTH TWICE A DAY   . FLUoxetine (PROZAC) 10 MG capsule Take 1 capsule (10 mg total) by mouth daily.   [name] gabapentin (NEURONTIN) 300 MG capsule TAKE 1 CAPSULE BY MOUTH TWO TIMES DAILY   . glipiZIDE (GLUCOTROL) 5 MG tablet Take 5 mg by mouth daily before breakfast.   . Glucose Blood (ACCU-CHEK SIMPLICITY TEST STRP VI) by In Vitro route. Insert 1 by subcutaneous route 4 times every day check blood sugar before breakfast, lunch and dinner and bedtime   . hydrOXYzine (VISTARIL) 100 MG capsule TAKE 1 CAPSULE BY MOUTH 3  TIMES DAILY AS NEEDED FOR  ITCHING   . metFORMIN (GLUCOPHAGE) 500 MG tablet Take 1,000 mg by mouth 2 (two) times daily.   . Olopatadine HCl (PAZEO) 0.7 % SOLN Apply 1 drop to eye as needed.    . vitamin E (VITAMIN E) 400 UNIT capsule Take 400 Units by mouth daily.    No facility-administered encounter medications on file as of 03/10/2020.     Goals Addressed            This Visit's Progress     Patient Stated   . "I am running out food and need housing resources" (pt-stated)       Current Barriers:  [name] Knowledge Deficits related to food and housing resources . Lacks caregiver support.  . Film/video editor.  . 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 Successful outbound call to the patient who confirms she has obtained Glucerna samples from provider office o Patient also reports receiving a bag of food from Dr. Gustavo [name] office to assist with food insecurity . Determined the patient has yet to receive communication from her apartment manager regarding letter sent requesting reasonable accommodations . Informed by the patient she is still trying to send her  ledger to [facility] with [employer] . Encouraged the patient to contact SW once she finds more out about mold remediation   Patient Self Care Activities:  . Patient verbalizes understanding of plan to f/u with her property manager regarding moving her into a clean apartment that is mold free . Self administers medications as prescribed . Attends all scheduled provider appointments . Calls pharmacy for medication refills . Performs ADL's independently . Performs IADL's independently . Calls provider office for new concerns or questions  Please see past updates related to this goal by clicking on the "Past Updates" button in the selected goal          Follow Up Plan: SW will follow up with patient by phone over the next week   Sherri [name], BSW, CDP Social Worker, Certified Dementia Practitioner Sherri [name] Care Management [phone]  Total time spent performing care coordination and/or care management activities with the patient by phone or face to face = 13 minutes.

## 2020-03-11 ENCOUNTER — Ambulatory Visit: Payer: Medicare Other

## 2020-03-11 DIAGNOSIS — E119 Type 2 diabetes mellitus without complications: Secondary | ICD-10-CM

## 2020-03-11 NOTE — Chronic Care Management (AMB) (Signed)
Chronic Care Management    Social Work Follow Up Note  03/11/2020 Name: Sherri [name] MRN: [id number] DOB: Jul 23, 1966  Sherri [name] is a 54 y.o. year old female who is a primary care patient of Minette Brine, [place]. The CCM team was consulted for assistance with care coordination.   Review of patient status, including review of consultants reports, other relevant assessments, and collaboration with appropriate care team members and the patient's provider was performed as part of comprehensive patient evaluation and provision of chronic care management services.      Outpatient Encounter Medications as of 03/11/2020  Medication Sig Note  . albuterol (PROAIR HFA) 108 (90 Base) MCG/ACT inhaler Inhale 2 puffs into the lungs every 6 (six) hours as needed for wheezing or shortness of breath.   . Ascorbic Acid (VITAMIN C) 1000 MG tablet Take 1,000 mg by mouth daily.   . baclofen (LIORESAL) 20 MG tablet TAKE 1 AND 1/2 TABLETS BY  MOUTH IN THE MORNING AND IN THE EVENING AND 1 TABLET AT MIDDAY   . cetirizine (ZYRTEC) 10 MG tablet TAKE 1 TABLET BY MOUTH EVERY DAY   . Cholecalciferol (VITAMIN D3) 5000 units CAPS Take by mouth.   . Cranberry-Vitamin C-Vitamin E (CRANBERRY PLUS VITAMIN C) 4200-20-3 MG-MG-UNIT CAPS Take by mouth.   . cyanocobalamin (,VITAMIN B-12,) 1000 MCG/ML injection Inject 1,000 mcg into the muscle every 3 (three) months.    . desloratadine (CLARINEX) 5 MG tablet Take 1 tablet (5 mg total) by mouth daily.   . diazepam (VALIUM) 10 MG tablet Take 2 tablets (20 mg total) by mouth 3 (three) times daily.   . diphenhydrAMINE (BENADRYL) 25 MG tablet Take 25 mg by mouth at bedtime as needed for itching or allergies.  01/17/2017: Takes as needed  . EPINEPHRINE 0.3 mg/0.3 mL IJ SOAJ injection INJECT INTRAMUSCULARLY  0.3ML AS NEEDED FOR  ANAPHYLAXIS . SEEK MEDICAL  ATTENTION AFTER USE. (Patient not taking: Reported on 03/10/2020)   . famotidine (PEPCID) 20 MG tablet TAKE 1 TABLET BY MOUTH TWICE A  DAY   . FLUoxetine (PROZAC) 10 MG capsule Take 1 capsule (10 mg total) by mouth daily.   [name] gabapentin (NEURONTIN) 300 MG capsule TAKE 1 CAPSULE BY MOUTH TWO TIMES DAILY   . glipiZIDE (GLUCOTROL) 5 MG tablet Take 5 mg by mouth daily before breakfast.   . Glucose Blood (ACCU-CHEK SIMPLICITY TEST STRP VI) by In Vitro route. Insert 1 by subcutaneous route 4 times every day check blood sugar before breakfast, lunch and dinner and bedtime   . hydrOXYzine (VISTARIL) 100 MG capsule TAKE 1 CAPSULE BY MOUTH 3  TIMES DAILY AS NEEDED FOR  ITCHING   . metFORMIN (GLUCOPHAGE) 500 MG tablet Take 1,000 mg by mouth 2 (two) times daily.   . Olopatadine HCl (PAZEO) 0.7 % SOLN Apply 1 drop to eye as needed.    . vitamin E (VITAMIN E) 400 UNIT capsule Take 400 Units by mouth daily.    No facility-administered encounter medications on file as of 03/11/2020.     Goals Addressed            This Visit's Progress     Patient Stated   . "I am running out food and need housing resources" (pt-stated)       Current Barriers:  [name] Knowledge Deficits related to food and housing resources . Lacks caregiver support.  . Film/video editor.  . 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 Collaboration with [employer] to order a 4 week extension of food to be delivered to the patients home to assist with food insecurity  Patient Self Care Activities:  . Patient verbalizes understanding of plan to f/u with her property manager regarding moving her into a clean apartment that is mold free . Self administers medications as prescribed . Attends all scheduled provider appointments . Calls pharmacy for medication refills . Performs ADL's independently . Performs  IADL's independently . Calls provider office for new concerns or questions  Please see past updates related to this goal by clicking on the "Past Updates" button in the selected goal          Follow Up Plan: SW will follow up with patient by phone over the next week   [name], BSW, CDP Social Worker, Certified Dementia Practitioner [facility] Care Management [phone]  Total time spent performing care coordination and/or care management activities with the patient by phone or face to face = 5 minutes.

## 2020-03-11 NOTE — Progress Notes (Signed)
Chronic Care Management   Visit Note  03/05/2020 Name: Sherri [name] MRN: [id number] DOB: August 18, 1966  Referred by: Arnette Felts, FNP Reason for referral : Chronic Care Management and Diabetes   Sherri [name] is a 54 y.o. year old female who is a primary care patient of Arnette Felts, FNP. The CCM team was consulted for assistance with chronic disease management and care coordination needs related to HLD and DMII  Review of patient status, including review of consultants reports, relevant laboratory and other test results, and collaboration with appropriate care team members and the patient's provider was performed as part of comprehensive patient evaluation and provision of chronic care management services.    SDOH (Social Determinants of Health) assessments performed: Yes See Care Plan activities for detailed interventions related to SDOH     Medications: Outpatient Encounter Medications as of 03/05/2020  Medication Sig Note  . albuterol (PROAIR HFA) 108 (90 Base) MCG/ACT inhaler Inhale 2 puffs into the lungs every 6 (six) hours as needed for wheezing or shortness of breath.   . Ascorbic Acid (VITAMIN C) 1000 MG tablet Take 1,000 mg by mouth daily.   . baclofen (LIORESAL) 20 MG tablet TAKE 1 AND 1/2 TABLETS BY  MOUTH IN THE MORNING AND IN THE EVENING AND 1 TABLET AT MIDDAY   . cetirizine (ZYRTEC) 10 MG tablet TAKE 1 TABLET BY MOUTH EVERY DAY   . Cholecalciferol (VITAMIN D3) 5000 units CAPS Take by mouth.   . Cranberry-Vitamin C-Vitamin E (CRANBERRY PLUS VITAMIN C) 4200-20-3 MG-MG-UNIT CAPS Take by mouth.   . cyanocobalamin (,VITAMIN B-12,) 1000 MCG/ML injection Inject 1,000 mcg into the muscle every 3 (three) months.    . desloratadine (CLARINEX) 5 MG tablet Take 1 tablet (5 mg total) by mouth daily.   . diazepam (VALIUM) 10 MG tablet Take 2 tablets (20 mg total) by mouth 3 (three) times daily.   . diphenhydrAMINE (BENADRYL) 25 MG tablet Take 25 mg by mouth at bedtime as needed for  itching or allergies.  01/17/2017: Takes as needed  . EPINEPHRINE 0.3 mg/0.3 mL IJ SOAJ injection INJECT INTRAMUSCULARLY  0.3ML AS NEEDED FOR  ANAPHYLAXIS . SEEK MEDICAL  ATTENTION AFTER USE. (Patient not taking: Reported on 03/10/2020)   . famotidine (PEPCID) 20 MG tablet TAKE 1 TABLET BY MOUTH TWICE A DAY   . FLUoxetine (PROZAC) 10 MG capsule Take 1 capsule (10 mg total) by mouth daily.   [name] gabapentin (NEURONTIN) 300 MG capsule TAKE 1 CAPSULE BY MOUTH TWO TIMES DAILY   . glipiZIDE (GLUCOTROL) 5 MG tablet Take 5 mg by mouth daily before breakfast.   . Glucose Blood (ACCU-CHEK SIMPLICITY TEST STRP VI) by In Vitro route. Insert 1 by subcutaneous route 4 times every day check blood sugar before breakfast, lunch and dinner and bedtime   . hydrOXYzine (VISTARIL) 100 MG capsule TAKE 1 CAPSULE BY MOUTH 3  TIMES DAILY AS NEEDED FOR  ITCHING   . Olopatadine HCl (PAZEO) 0.7 % SOLN Apply 1 drop to eye as needed.    . vitamin E (VITAMIN E) 400 UNIT capsule Take 400 Units by mouth daily.   . [DISCONTINUED] metFORMIN (GLUMETZA) 1000 MG (MOD) 24 hr tablet Take 1 tablet (1,000 mg total) by mouth 2 (two) times daily with a meal.    No facility-administered encounter medications on file as of 03/05/2020.     Objective:   Goals Addressed            This Visit's Progress     Patient  Stated   . "To keep my diabetes under good control" (pt-stated)       Current Barriers:  [name] Knowledge Deficits related to disease process and Self Health management of Asthma . 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 Evaluation of current treatment plan related to Diabetes and patient's adherence to plan as  established by provider. . Provided education to patient re: increase in A1C to 8.5 obtained on 01/15/20; educated on target A1C <7.0 and how to achieve this goal; education provided re: daily glycemic goal of 80-130 to help maintain/achieve lower A1C; educated patient on interventions to take with hypoglycemic events; patient reports 1 low of 67, and 1 high in 300's but average is within goal; she randomly checks FBS  . Reviewed medications with patient and discussed recent in increase with Metformin to 500 mg 2 tablets twice daily (1000 mg total), discussed additional recommendations to add Glipizide back to her regimen taking 1 tablet daily with breakfast; patient states she is adhering but is having GI SE related to this txt plan . Discussed plans with patient for ongoing care management follow up and provided patient with direct contact information for care management team . Advised patient, providing education and rationale, to check cbg daily before meals and record, calling the CCM team and or PCP for findings outside established parameters   02/10/20 Inbound call from patient, voice message received  . Patient stated she is taking her medications exactly as prescribed and continues to have GI SE since increasing the Metformin and adding Glipizide; FBS this morning was 62 . Collaborated with embedded Pharm D Vanice Sarah via in basket message, requested to f/u with patient; advised patient's A1C has increased to 8.5 and patient reports having SE to increased dose of Metformin  CCM PharmD interventions: Call completed with patient on 03/05/20 . Reviewed medications with patient--focused on DM medications o Current antihyperglycemic regimen-metformin, glipizide - Patient using accu check nano glucometer - Needs multiclix lancets for multiclix device--will route message to CMA o Most recent A1c 8.5% on 1/21, however previous A1cs have been at goal o Discussed transitioning complete regimen to  GLP1 (I.e Ozempic) vs SGLT1 (jardiance/farxiga) o Patient would like to wait until PCP appt coming up in April o She states she has not had Bg<70 in x1 month;  She states her Bgs have been >70 but <200 over the past month o She continues to have stressors in her life (ex husband, significant others, etc) that inhibit her optimal glycemic control. o Patient is on disability/medicare, therefore would recommend patient assistance program through [employer] or novo nordisk o Recommend lipid panel and repeat A1c to determine therapy changes.  Patient Self Care Activities:  . Self administers medications as prescribed . Attends all scheduled provider appointments . Calls pharmacy for medication refills . Performs ADL's independently . Performs IADL's independently . Calls provider office for new concerns or questions  Please see past updates related to this goal by clicking on the "Past Updates" button in the selected goal           Plan:   The care management team will reach out to the patient again over the  next 30 days.   Provider Signature Regina Eck, PharmD, BCPS Clinical Pharmacist, [facility] [place]: [phone]

## 2020-03-11 NOTE — Patient Instructions (Signed)
Social Worker Visit Information  Goals we discussed today:  Goals Addressed            This Visit's Progress     Patient Stated   . "I am running out food and need housing resources" (pt-stated)       Current Barriers:  [name] Knowledge Deficits related to food and housing resources . Lacks caregiver support.  . Corporate treasurer.  . 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 Collaboration with [employer] to order a 4 week extension of food to be delivered to the patients home to assist with food insecurity  Patient Self Care Activities:  . Patient verbalizes understanding of plan to f/u with her property manager regarding moving her into a clean apartment that is mold free . Self administers medications as prescribed . Attends all scheduled provider appointments . Calls pharmacy for medication refills . Performs ADL's independently . Performs IADL's independently . Calls provider office for new concerns or questions  Please see past updates related to this goal by clicking on the "Past Updates" button in the selected goal          Follow Up Plan: SW will follow up with patient by phone over the next week.   [name], BSW, CDP Social Worker, Certified Dementia Practitioner [facility] Care Management [phone]

## 2020-03-11 NOTE — Patient Instructions (Signed)
Visit Information  Goals Addressed            This Visit's Progress     Patient Stated   . "To keep my diabetes under good control" (pt-stated)       Current Barriers:  [name] Knowledge Deficits related to disease process and Self Health management of Asthma . 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 Evaluation of current treatment plan related to Diabetes and patient's adherence to plan as established by provider. . Provided education to patient re: increase in A1C to 8.5 obtained on 01/15/20; educated on target A1C <7.0 and how to achieve this goal; education provided re: daily glycemic goal of 80-130 to help maintain/achieve lower A1C; educated patient on interventions to take with hypoglycemic events; patient reports 1 low of 67, and 1 high in 300's but average is within goal; she randomly checks FBS  . Reviewed medications with patient and discussed recent in increase with Metformin to 500 mg 2 tablets twice daily (1000 mg total), discussed additional recommendations to add Glipizide back to her regimen taking 1 tablet daily with breakfast; patient states she is adhering but is having GI SE related to this txt plan . Discussed plans with patient for ongoing care management follow up and provided patient with direct contact information for care management team . Advised patient, providing education and rationale, to check cbg daily before meals and record, calling the CCM team and or PCP for findings outside established parameters   02/10/20 Inbound call from patient, voice message received  . Patient stated she is taking her medications exactly as prescribed and continues  to have GI SE since increasing the Metformin and adding Glipizide; FBS this morning was 62 . Collaborated with embedded Pharm D Vanice Sarah via in basket message, requested to f/u with patient; advised patient's A1C has increased to 8.5 and patient reports having SE to increased dose of Metformin  CCM PharmD interventions: Call completed with patient on 03/05/20 . Reviewed medications with patient--focused on DM medications o Current antihyperglycemic regimen-metformin, glipizide - Patient using accu check nano glucometer - Needs multiclix lancets for multiclix device--will route message to CMA o Most recent A1c 8.5% on 1/21, however previous A1cs have been at goal o Discussed transitioning complete regimen to GLP1 (I.e Ozempic) vs SGLT1 (jardiance/farxiga) o Patient would like to wait until PCP appt coming up in April o She states she has not had Bg<70 in x1 month;  She states her Bgs have been >70 but <200 over the past month o She continues to have stressors in her life (ex husband, significant others, etc) that inhibit her optimal glycemic control. o Patient is on disability/medicare, therefore would recommend patient assistance program through [employer] cares or novo nordisk o Recommend lipid panel and repeat A1c to determine therapy changes.  Patient Self Care Activities:  . Self administers medications as prescribed . Attends all scheduled provider appointments . Calls pharmacy for medication refills . Performs ADL's independently . Performs IADL's independently . Calls provider office for new concerns or questions  Please see past updates related to this goal by clicking on the "Past Updates" button in the selected goal  The patient verbalized understanding of instructions provided today and declined a print copy of patient instruction materials.   The care management team will reach out to the patient again over the next 30 days.   SIGNATURE Regina Eck,  PharmD, BCPS Clinical Pharmacist, [facility] [place]: [phone]

## 2020-03-15 ENCOUNTER — Telehealth: Payer: Self-pay | Admitting: *Deleted

## 2020-03-15 NOTE — Telephone Encounter (Signed)
Message received from patient requesting a call back regarding iron results from 03/10/20.  Call placed back to patient and patient notified that iron results from 03/10/20 are all WNL per Dr. Myna Hidalgo.  Pt appreciative of call back and has no questions or concerns at this time.

## 2020-03-17 ENCOUNTER — Ambulatory Visit: Payer: Self-pay

## 2020-03-17 ENCOUNTER — Telehealth: Payer: Self-pay

## 2020-03-17 DIAGNOSIS — J45909 Unspecified asthma, uncomplicated: Secondary | ICD-10-CM

## 2020-03-17 DIAGNOSIS — E119 Type 2 diabetes mellitus without complications: Secondary | ICD-10-CM

## 2020-03-17 NOTE — Chronic Care Management (AMB) (Signed)
  Chronic Care Management   Outreach Note  03/17/2020 Name: Nataliee Shurtz MRN: [id number] DOB: 11/07/1966  Referred by: Arnette Felts, FNP Reason for referral : Care Coordination   SW placed an unsuccessful outbound call to the patient to assist with care coordination needs. SW left a HIPAA compliant voice message requesting a return call.  Follow Up Plan: The care management team will reach out to the patient again over the next 14 days.   [name], BSW, CDP Social Worker, Certified Dementia Practitioner [facility] Care Management [phone]

## 2020-03-18 ENCOUNTER — Ambulatory Visit: Payer: Self-pay

## 2020-03-18 DIAGNOSIS — E119 Type 2 diabetes mellitus without complications: Secondary | ICD-10-CM

## 2020-03-18 DIAGNOSIS — J45909 Unspecified asthma, uncomplicated: Secondary | ICD-10-CM | POA: Diagnosis not present

## 2020-03-18 NOTE — Chronic Care Management (AMB) (Signed)
Chronic Care Management    Clinical Social Work Follow Up Note  03/18/2020 Name: Sherri [name] MRN: [id number] DOB: 03/04/1966  Sherri [name] is a 54 y.o. year old female who is a primary care patient of Arnette Felts, FNP. The CCM team was consulted for assistance with care coordination.   Review of patient status, including review of consultants reports, other relevant assessments, and collaboration with appropriate care team members and the patient's provider was performed as part of comprehensive patient evaluation and provision of chronic care management services.    SDOH (Social Determinants of Health) assessments performed: No    Outpatient Encounter Medications as of 03/18/2020  Medication Sig Note  . albuterol (PROAIR HFA) 108 (90 Base) MCG/ACT inhaler Inhale 2 puffs into the lungs every 6 (six) hours as needed for wheezing or shortness of breath.   . Ascorbic Acid (VITAMIN C) 1000 MG tablet Take 1,000 mg by mouth daily.   . baclofen (LIORESAL) 20 MG tablet TAKE 1 AND 1/2 TABLETS BY  MOUTH IN THE MORNING AND IN THE EVENING AND 1 TABLET AT MIDDAY   . cetirizine (ZYRTEC) 10 MG tablet TAKE 1 TABLET BY MOUTH EVERY DAY   . Cholecalciferol (VITAMIN D3) 5000 units CAPS Take by mouth.   . Cranberry-Vitamin C-Vitamin E (CRANBERRY PLUS VITAMIN C) 4200-20-3 MG-MG-UNIT CAPS Take by mouth.   . cyanocobalamin (,VITAMIN B-12,) 1000 MCG/ML injection Inject 1,000 mcg into the muscle every 3 (three) months.    . desloratadine (CLARINEX) 5 MG tablet Take 1 tablet (5 mg total) by mouth daily.   . diazepam (VALIUM) 10 MG tablet Take 2 tablets (20 mg total) by mouth 3 (three) times daily.   . diphenhydrAMINE (BENADRYL) 25 MG tablet Take 25 mg by mouth at bedtime as needed for itching or allergies.  01/17/2017: Takes as needed  . EPINEPHRINE 0.3 mg/0.3 mL IJ SOAJ injection INJECT INTRAMUSCULARLY  0.3ML AS NEEDED FOR  ANAPHYLAXIS . SEEK MEDICAL  ATTENTION AFTER USE. (Patient not taking: Reported on  03/10/2020)   . famotidine (PEPCID) 20 MG tablet TAKE 1 TABLET BY MOUTH TWICE A DAY   . FLUoxetine (PROZAC) 10 MG capsule Take 1 capsule (10 mg total) by mouth daily.   [name] gabapentin (NEURONTIN) 300 MG capsule TAKE 1 CAPSULE BY MOUTH TWO TIMES DAILY   . glipiZIDE (GLUCOTROL) 5 MG tablet Take 5 mg by mouth daily before breakfast.   . Glucose Blood (ACCU-CHEK SIMPLICITY TEST STRP VI) by In Vitro route. Insert 1 by subcutaneous route 4 times every day check blood sugar before breakfast, lunch and dinner and bedtime   . hydrOXYzine (VISTARIL) 100 MG capsule TAKE 1 CAPSULE BY MOUTH 3  TIMES DAILY AS NEEDED FOR  ITCHING   . metFORMIN (GLUCOPHAGE) 500 MG tablet Take 1,000 mg by mouth 2 (two) times daily.   . Olopatadine HCl (PAZEO) 0.7 % SOLN Apply 1 drop to eye as needed.    . vitamin E (VITAMIN E) 400 UNIT capsule Take 400 Units by mouth daily.    No facility-administered encounter medications on file as of 03/18/2020.     Goals Addressed            This Visit's Progress     Patient Stated   . "I am running out food and need housing resources" (pt-stated)       Current Barriers:  [name] Knowledge Deficits related to food and housing resources . Lacks caregiver support.  . Corporate treasurer.  . Chronic Disease Management support and education  needs related to Diabetes, Asthma, Stiff Person Syndrome  Nurse Case Manager Clinical Goal(s):  [name] Over the next 60 days, patient will work with embedded BSW  to address needs related to food and housing resources . New 03/18/2020 Over the next 90 days the patient will work with care management team to address ongoing concern of weight loss  CCM SW Interventions Completed 03/18/2020 . Successful outbound call placed to the patient in response to voice message received . The patient reports she is working to determined how to transfer funds around to assist with back payment owed to Radiographer, therapeutic . Determined the patient has yet to successfully submit  her ledger to the [place] the patient to have her sister assist with transportation to submit to the office in person . Discussed the patients growing concern over weight loss "I can't seem to get my weight up" o Reviewed the patient has successfully obtained Glucerna samples from provider office o Determined the patient has been receiving meals provided by [place] [employer] program and has another two weeks worth of meals to be provided . Determined the patient has been fearful of the reason she may be losing weight stating "I told my sister I may have cancer but my labs were ok at Dr. Antonieta Pert office so I guess it's not that" o Patient reported weight from 03/17/2020 to be 101.8 o Performed chart review to note last OV with Dr. Marin Olp patient weight recorded as 106 on 03/10/20 o Collaboration with RN Case Manager regarding patient self concern surrounding continued weight loss . Collaboration with Minette Brine FNP to report weight change and request follow up at next scheduled OV for 04/22/2020  Patient Self Care Activities:  . Patient verbalizes understanding of plan to f/u with her property manager regarding moving her into a clean apartment that is mold free . Self administers medications as prescribed . Attends all scheduled provider appointments . Calls pharmacy for medication refills . Performs ADL's independently . Performs IADL's independently . Calls provider office for new concerns or questions  Please see past updates related to this goal by clicking on the "Past Updates" button in the selected goal          Follow Up Plan: SW will follow up with patient by phone over the next two weeks.  Daneen Schick, BSW, CDP Social Worker, Certified Dementia Practitioner [place] [facility] [place] Management [phone]  Total time spent performing care coordination and/or care management activities with the patient by phone or face to face = 17 minutes.

## 2020-03-18 NOTE — Patient Instructions (Signed)
Social Worker Visit Information  Goals we discussed today:  Goals Addressed            This Visit's Progress     Patient Stated   . "I am running out food and need housing resources" (pt-stated)       Current Barriers:  [name] Knowledge Deficits related to food and housing resources . Lacks caregiver support.  . Corporate treasurer.  . 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 New 03/18/2020 Over the next 90 days the patient will work with care management team to address ongoing concern of weight loss  CCM SW Interventions Completed 03/18/2020 . Successful outbound call placed to the patient in response to voice message received . The patient reports she is working to determined how to transfer funds around to assist with back payment owed to Health and safety inspector . Determined the patient has yet to successfully submit her ledger to the [employer] o Encouraged the patient to have her sister assist with transportation to submit to the office in person . Discussed the patients growing concern over weight loss "I can't seem to get my weight up" o Reviewed the patient has successfully obtained Glucerna samples from provider office o Determined the patient has been receiving meals provided by [facility] Meals for Mom program and has another two weeks worth of meals to be provided . Determined the patient has been fearful of the reason she may be losing weight stating "I told my sister I may have cancer but my labs were ok at Dr. Gustavo Lah office so I guess it's not that" o Patient reported weight from 03/17/2020 to be 101.8 o Performed chart review to note last OV with Dr. Myna Hidalgo patient weight recorded as 106 on 03/10/20 o Collaboration with RN Case Manager regarding  patient self concern surrounding continued weight loss . Collaboration with Arnette Felts FNP to report weight change and request follow up at next scheduled OV for 04/22/2020  Patient Self Care Activities:  . Patient verbalizes understanding of plan to f/u with her property manager regarding moving her into a clean apartment that is mold free . Self administers medications as prescribed . Attends all scheduled provider appointments . Calls pharmacy for medication refills . Performs ADL's independently . Performs IADL's independently . Calls provider office for new concerns or questions  Please see past updates related to this goal by clicking on the "Past Updates" button in the selected goal          Follow Up Plan: SW will follow up with patient by phone over the next two weeks.  [name], BSW, CDP Social Worker, Certified Dementia Practitioner [facility] Care Management [phone]

## 2020-03-22 ENCOUNTER — Telehealth: Payer: Self-pay

## 2020-03-23 ENCOUNTER — Telehealth: Payer: Self-pay

## 2020-03-23 ENCOUNTER — Other Ambulatory Visit: Payer: Self-pay

## 2020-03-23 ENCOUNTER — Other Ambulatory Visit: Payer: Self-pay | Admitting: Allergy and Immunology

## 2020-03-23 MED ORDER — DESLORATADINE 5 MG PO TABS: 5.0000 mg | ORAL_TABLET | Freq: Every day | ORAL | 0 refills | Status: DC

## 2020-03-23 NOTE — Telephone Encounter (Signed)
I called patient to notify her that the desloratadine is out of stock at [facility] so we wanted to know if she wanted [place] to send her med to the local pharmacy instead. Left pt v/m to call the office [facility]

## 2020-03-24 ENCOUNTER — Telehealth: Payer: Medicare Other

## 2020-03-24 ENCOUNTER — Other Ambulatory Visit: Payer: Self-pay

## 2020-03-24 ENCOUNTER — Ambulatory Visit: Payer: Self-pay

## 2020-03-25 NOTE — Chronic Care Management (AMB) (Signed)
  Chronic Care Management   Outreach Note  03/25/2020 Name: Sherri [name] MRN: [id number] DOB: Apr 05, 1966  Referred by: Arnette Felts, FNP Reason for referral : Chronic Care Management (FU RN Call )   An unsuccessful telephone outreach was attempted today. The patient was referred to the case management team for assistance with care management and care coordination.   Follow Up Plan: Telephone follow up appointment with care management team member scheduled for: 04/09/20  Delsa Sale, RN, BSN, CCM Care Management Coordinator [facility] Care Management/[facility]  Direct Phone: [phone]

## 2020-03-31 ENCOUNTER — Ambulatory Visit: Payer: Self-pay

## 2020-03-31 ENCOUNTER — Telehealth: Payer: Self-pay

## 2020-03-31 DIAGNOSIS — E119 Type 2 diabetes mellitus without complications: Secondary | ICD-10-CM

## 2020-03-31 NOTE — Chronic Care Management (AMB) (Signed)
  Chronic Care Management   Outreach Note  03/31/2020 Name: Sherri [name] MRN: [id number] DOB: [birth year]  Referred by: Arnette Felts, FNP Reason for referral : Care Coordination   SW placed an unsuccessful outbound call to the patient to assist with care coordination needs. SW left a HIPAA compliant voice message requesting a return call.  Follow Up Plan: The care management team will reach out to the patient again over the next 14 days.   [name], BSW, CDP Social Worker, Certified Dementia Practitioner [facility] Care Management [phone]

## 2020-04-09 ENCOUNTER — Telehealth: Payer: Self-pay

## 2020-04-13 ENCOUNTER — Ambulatory Visit: Payer: Medicare Other

## 2020-04-13 DIAGNOSIS — E119 Type 2 diabetes mellitus without complications: Secondary | ICD-10-CM

## 2020-04-13 NOTE — Chronic Care Management (AMB) (Signed)
Chronic Care Management    Social Work Follow Up Note  04/13/2020 Name: Sherri [name] MRN: [id number] DOB: 08-Apr-1966  Sherri [name] is a 54 y.o. year old female who is a primary care patient of Minette Brine, [place]. The CCM team was consulted for assistance with care coordination.   Review of patient status, including review of consultants reports, other relevant assessments, and collaboration with appropriate care team members and the patient's provider was performed as part of comprehensive patient evaluation and provision of chronic care management services.    SDOH (Social Determinants of Health) assessments performed: No    Outpatient Encounter Medications as of 04/13/2020  Medication Sig Note  . albuterol (PROAIR HFA) 108 (90 Base) MCG/ACT inhaler Inhale 2 puffs into the lungs every 6 (six) hours as needed for wheezing or shortness of breath.   . Ascorbic Acid (VITAMIN C) 1000 MG tablet Take 1,000 mg by mouth daily.   . baclofen (LIORESAL) 20 MG tablet TAKE 1 AND 1/2 TABLETS BY  MOUTH IN THE MORNING AND IN THE EVENING AND 1 TABLET AT MIDDAY   . cetirizine (ZYRTEC) 10 MG tablet TAKE 1 TABLET BY MOUTH EVERY DAY   . Cholecalciferol (VITAMIN D3) 5000 units CAPS Take by mouth.   . Cranberry-Vitamin C-Vitamin E (CRANBERRY PLUS VITAMIN C) 4200-20-3 MG-MG-UNIT CAPS Take by mouth.   . cyanocobalamin (,VITAMIN B-12,) 1000 MCG/ML injection Inject 1,000 mcg into the muscle every 3 (three) months.    . desloratadine (CLARINEX) 5 MG tablet Take 1 tablet (5 mg total) by mouth daily.   . diazepam (VALIUM) 10 MG tablet Take 2 tablets (20 mg total) by mouth 3 (three) times daily.   . diphenhydrAMINE (BENADRYL) 25 MG tablet Take 25 mg by mouth at bedtime as needed for itching or allergies.  01/17/2017: Takes as needed  . EPINEPHRINE 0.3 mg/0.3 mL IJ SOAJ injection INJECT INTRAMUSCULARLY  0.3ML AS NEEDED FOR  ANAPHYLAXIS . SEEK MEDICAL  ATTENTION AFTER USE. (Patient not taking: Reported on 03/10/2020)     . famotidine (PEPCID) 20 MG tablet TAKE 1 TABLET BY MOUTH TWICE A DAY   . FLUoxetine (PROZAC) 10 MG capsule Take 1 capsule (10 mg total) by mouth daily.   [name] gabapentin (NEURONTIN) 300 MG capsule TAKE 1 CAPSULE BY MOUTH TWO TIMES DAILY   . glipiZIDE (GLUCOTROL) 5 MG tablet Take 5 mg by mouth daily before breakfast.   . Glucose Blood (ACCU-CHEK SIMPLICITY TEST STRP VI) by In Vitro route. Insert 1 by subcutaneous route 4 times every day check blood sugar before breakfast, lunch and dinner and bedtime   . hydrOXYzine (VISTARIL) 100 MG capsule TAKE 1 CAPSULE BY MOUTH 3  TIMES DAILY AS NEEDED FOR  ITCHING   . metFORMIN (GLUCOPHAGE) 500 MG tablet Take 1,000 mg by mouth 2 (two) times daily.   . Olopatadine HCl (PAZEO) 0.7 % SOLN Apply 1 drop to eye as needed.    . vitamin E (VITAMIN E) 400 UNIT capsule Take 400 Units by mouth daily.    No facility-administered encounter medications on file as of 04/13/2020.     Goals Addressed            This Visit's Progress     Patient Stated   . "I am running out food and need housing resources" (pt-stated)       Current Barriers:  [name] Knowledge Deficits related to food and housing resources . Lacks caregiver support.  . Film/video editor.  . Chronic Disease Management support and education  needs related to Diabetes, Asthma, Stiff Person Syndrome  Nurse Case Manager Clinical Goal(s):  [name] Over the next 60 days, patient will work with embedded BSW  to address needs related to food and housing resources  Goal Met . New 03/18/2020 Over the next 90 days the patient will work with care management team to address ongoing concern of weight loss  CCM SW Interventions Completed 04/13/2020 . Successful outbound call placed to the patient to assess progression of patient stated goal . Determined the patient continues to experience weight loss with today's reported weight of 98.4 . Discussed patient continues to experience lack of appetite and has yet to eat today  or take prescribed medications . Reviewed importance of balanced diet in order to maintain a healthy weight o The patient reports her daughter brought steak to the patient on 4/19 to cook. The patient reports cooking them on a low temperature in the over yesterday due to the patients habit of falling asleep.  o It is reported the patient did fall asleep but awoke late last night to eat o Discussed importance of eating carbohydrates and other nutrients rather than just protein o Confirmed the patient is continuing to drink Glucerna . Advised the patient SW has already updated the patients provider regarding previous weight loss concerns which will be reviewed at upcoming primary care appointment . Collaboration with Minette Brine, FNP and [place] of continued weight loss  CCM RN CM Interventions:  02/06/20 late entry - completed call with patient  . Determined patient is no longer receiving food stamps due to being notified her income was over the limit; discussed this is causing her to run out of food and or she is unable to purchase healthy foods to help her adhere to an ADA diet . Determined she is no longer able to afford Ensure or Glucerna and would like coupons or resources for this nutritional supplement . Determined she may need resources for housing and or contact information if needed to report her property manager is not doing all things necessary to get her into an apartment that does not contain mold spores . Collaborated with embedded BSW Sherri [name]  regarding patient's request for food and housing resources and coupons for Glucerna . Discussed plans with patient for ongoing care management follow up and provided patient with direct contact information for care management team  Patient Self Care Activities:  . Patient verbalizes understanding of plan to f/u with her property manager regarding moving her into a clean apartment that is mold free . Self administers  medications as prescribed . Attends all scheduled provider appointments . Calls pharmacy for medication refills . Performs ADL's independently . Performs IADL's independently . Calls provider office for new concerns or questions  Please see past updates related to this goal by clicking on the "Past Updates" button in the selected goal          Follow Up Plan: SW will follow up with patient by phone over the next month.   Sherri [name], BSW, CDP Social Worker, Certified Dementia Practitioner [place] [facility] [place] Management [phone]  Total time spent performing care coordination and/or care management activities with the patient by phone or face to face = 17 minutes.

## 2020-04-13 NOTE — Patient Instructions (Signed)
Social Worker Visit Information  Goals we discussed today:  Goals Addressed            This Visit's Progress     Patient Stated   . "I am running out food and need housing resources" (pt-stated)       Current Barriers:  [name] Knowledge Deficits related to food and housing resources . Lacks caregiver support.  . Film/video editor.  . 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 New 03/18/2020 Over the next 90 days the patient will work with care management team to address ongoing concern of weight loss  CCM SW Interventions Completed 04/13/2020 . Successful outbound call placed to the patient to assess progression of patient stated goal . Determined the patient continues to experience weight loss with today's reported weight of 98.4 . Discussed patient continues to experience lack of appetite and has yet to eat today or take prescribed medications . Reviewed importance of balanced diet in order to maintain a healthy weight o The patient reports her daughter brought steak to the patient on 4/19 to cook. The patient reports cooking them on a low temperature in the over yesterday due to the patients habit of falling asleep.  o It is reported the patient did fall asleep but awoke late last night to eat o Discussed importance of eating carbohydrates and other nutrients rather than just protein o Confirmed the patient is continuing to drink Glucerna . Advised the patient SW has already updated the patients provider regarding previous weight loss concerns which will be reviewed at upcoming primary care appointment . Collaboration with Minette Brine, FNP and [place] of continued weight loss  CCM RN CM Interventions:  02/06/20 late entry -  completed call with patient  . Determined patient is no longer receiving food stamps due to being notified her income was over the limit; discussed this is causing her to run out of food and or she is unable to purchase healthy foods to help her adhere to an ADA diet . Determined she is no longer able to afford Ensure or Glucerna and would like coupons or resources for this nutritional supplement . Determined she may need resources for housing and or contact information if needed to report her property manager is not doing all things necessary to get her into an apartment that does not contain mold spores . Collaborated with embedded BSW Daneen Schick  regarding patient's request for food and housing resources and coupons for Glucerna . Discussed plans with patient for ongoing care management follow up and provided patient with direct contact information for care management team  Patient Self Care Activities:  . Patient verbalizes understanding of plan to f/u with her property manager regarding moving her into a clean apartment that is mold free . Self administers medications as prescribed . Attends all scheduled provider appointments . Calls pharmacy for medication refills . Performs ADL's independently . Performs IADL's independently . Calls provider office for new concerns or questions  Please see past updates related to this goal by clicking on the "Past Updates" button in the selected goal          Follow Up Plan: SW will follow up with patient by phone over the next month   Nayeli Calvert  Scarlette Ar, CDP Education officer, museum, Certified Dementia Practitioner [place] [facility] [place] Management [phone]

## 2020-04-14 ENCOUNTER — Other Ambulatory Visit: Payer: Self-pay | Admitting: Allergy and Immunology

## 2020-04-22 ENCOUNTER — Ambulatory Visit: Payer: Medicare Other

## 2020-04-22 ENCOUNTER — Ambulatory Visit: Payer: Medicare Other | Admitting: Nurse Practitioner

## 2020-04-29 ENCOUNTER — Telehealth: Payer: Self-pay

## 2020-04-29 NOTE — Telephone Encounter (Signed)
PT LVM WANTING TO [facility] 4/29 VISIT ATT TO CONTACT PT NO ANS NO VM PICKS UP LINE DROPS

## 2020-05-03 ENCOUNTER — Telehealth: Payer: Medicare Other

## 2020-05-03 ENCOUNTER — Other Ambulatory Visit: Payer: Self-pay

## 2020-05-03 ENCOUNTER — Ambulatory Visit: Payer: Self-pay

## 2020-05-03 DIAGNOSIS — G2582 Stiff-man syndrome: Secondary | ICD-10-CM

## 2020-05-03 DIAGNOSIS — J45909 Unspecified asthma, uncomplicated: Secondary | ICD-10-CM

## 2020-05-03 DIAGNOSIS — E119 Type 2 diabetes mellitus without complications: Secondary | ICD-10-CM

## 2020-05-04 ENCOUNTER — Ambulatory Visit: Payer: Self-pay

## 2020-05-04 ENCOUNTER — Telehealth: Payer: Self-pay

## 2020-05-04 ENCOUNTER — Other Ambulatory Visit: Payer: Self-pay | Admitting: Neurology

## 2020-05-04 DIAGNOSIS — J45909 Unspecified asthma, uncomplicated: Secondary | ICD-10-CM

## 2020-05-04 DIAGNOSIS — E119 Type 2 diabetes mellitus without complications: Secondary | ICD-10-CM

## 2020-05-04 NOTE — Chronic Care Management (AMB) (Signed)
  Chronic Care Management   Outreach Note  05/04/2020 Name: Sherri [name] MRN: [id number] DOB: 1966/05/12  Referred by: Arnette Felts, FNP Reason for referral : Care Coordination   SW placed unsuccessful outbound call to the patient to assist with care coordination needs. The patients voice mailbox is full which prohibited SW from leaving a HIPAA compliant voice message requesting a return call.  Follow Up Plan: The care management team will reach out to the patient again over the next 14 days.   [name], BSW, CDP Social Worker, Certified Dementia Practitioner [facility] Care Management [phone]

## 2020-05-04 NOTE — Chronic Care Management (AMB) (Signed)
  Chronic Care Management   Outreach Note  05/04/2020 Name: Sherri [name] MRN: [id number] DOB: 08/23/1966  Referred by: Arnette Felts, FNP Reason for referral : Chronic Care Management (FU RN Call-Asthma/DM/weight loss)   An unsuccessful telephone outreach was attempted today. The patient was referred to the case management team for assistance with care management and care coordination.   Follow Up Plan: Telephone follow up appointment with care management team member scheduled for: 06/01/20  Delsa Sale, RN, BSN, CCM Care Management Coordinator [facility] Care Management/[facility]  Direct Phone: [phone]

## 2020-05-12 ENCOUNTER — Telehealth: Payer: Self-pay | Admitting: Nurse Practitioner

## 2020-05-12 NOTE — Chronic Care Management (AMB) (Signed)
  Care Management   Note  05/12/2020 Name: Sherri [name] MRN: [id number] DOB: 11-21-1966  [name] is a 54 y.o. year old female who is a primary care patient of Arnette Felts, FNP and is actively engaged with the care management team. I reached out to [name] by phone today to assist with scheduling an initial visit with the Pharmacist  Follow up plan: Telephone appointment with care management team member scheduled for: 06/14/2020  [facility] Guide, Embedded Care Coordination [facility]  [place], [place] [address] Direct Dial: [phone] [email or web address]

## 2020-05-13 ENCOUNTER — Ambulatory Visit (INDEPENDENT_AMBULATORY_CARE_PROVIDER_SITE_OTHER): Payer: Medicare Other

## 2020-05-13 DIAGNOSIS — J45909 Unspecified asthma, uncomplicated: Secondary | ICD-10-CM

## 2020-05-13 DIAGNOSIS — E119 Type 2 diabetes mellitus without complications: Secondary | ICD-10-CM

## 2020-05-13 NOTE — Chronic Care Management (AMB) (Signed)
Chronic Care Management    Social Work Follow Up Note  05/13/2020 Name: Sherri [name] MRN: [id number] DOB: 04/09/1966  Sherri [name] is a 54 y.o. year old female who is a primary care patient of Minette Brine, [place]. The CCM team was consulted for assistance with care coordination.   Review of patient status, including review of consultants reports, other relevant assessments, and collaboration with appropriate care team members and the patient's provider was performed as part of comprehensive patient evaluation and provision of chronic care management services.    SDOH (Social Determinants of Health) assessments performed: No    Outpatient Encounter Medications as of 05/13/2020  Medication Sig Note  . albuterol (PROAIR HFA) 108 (90 Base) MCG/ACT inhaler Inhale 2 puffs into the lungs every 6 (six) hours as needed for wheezing or shortness of breath.   . Ascorbic Acid (VITAMIN C) 1000 MG tablet Take 1,000 mg by mouth daily.   . baclofen (LIORESAL) 20 MG tablet TAKE 1 AND 1/2 TABLETS BY  MOUTH IN THE MORNING AND IN THE EVENING AND 1 TABLET AT MIDDAY   . cetirizine (ZYRTEC) 10 MG tablet TAKE 1 TABLET BY MOUTH EVERY DAY   . Cholecalciferol (VITAMIN D3) 5000 units CAPS Take by mouth.   . Cranberry-Vitamin C-Vitamin E (CRANBERRY PLUS VITAMIN C) 4200-20-3 MG-MG-UNIT CAPS Take by mouth.   . cyanocobalamin (,VITAMIN B-12,) 1000 MCG/ML injection Inject 1,000 mcg into the muscle every 3 (three) months.    . desloratadine (CLARINEX) 5 MG tablet Take 1 tablet (5 mg total) by mouth daily.   . diazepam (VALIUM) 10 MG tablet Take 2 tablets (20 mg total) by mouth 3 (three) times daily.   . diphenhydrAMINE (BENADRYL) 25 MG tablet Take 25 mg by mouth at bedtime as needed for itching or allergies.  01/17/2017: Takes as needed  . EPINEPHRINE 0.3 mg/0.3 mL IJ SOAJ injection INJECT INTRAMUSCULARLY  0.3ML AS NEEDED FOR  ANAPHYLAXIS . SEEK MEDICAL  ATTENTION AFTER USE. (Patient not taking: Reported on 03/10/2020)    . famotidine (PEPCID) 20 MG tablet TAKE 1 TABLET BY MOUTH TWICE A DAY   . FLUoxetine (PROZAC) 10 MG capsule TAKE 1 CAPSULE BY MOUTH  DAILY   . gabapentin (NEURONTIN) 300 MG capsule TAKE 1 CAPSULE BY MOUTH TWO TIMES DAILY   . glipiZIDE (GLUCOTROL) 5 MG tablet Take 5 mg by mouth daily before breakfast.   . Glucose Blood (ACCU-CHEK SIMPLICITY TEST STRP VI) by In Vitro route. Insert 1 by subcutaneous route 4 times every day check blood sugar before breakfast, lunch and dinner and bedtime   . hydrOXYzine (VISTARIL) 100 MG capsule TAKE 1 CAPSULE BY MOUTH 3  TIMES DAILY AS NEEDED FOR  ITCHING   . metFORMIN (GLUCOPHAGE) 500 MG tablet Take 1,000 mg by mouth 2 (two) times daily.   . Olopatadine HCl (PAZEO) 0.7 % SOLN Apply 1 drop to eye as needed.    . vitamin E (VITAMIN E) 400 UNIT capsule Take 400 Units by mouth daily.    No facility-administered encounter medications on file as of 05/13/2020.     Goals Addressed            This Visit's Progress     Patient Stated   . "I am running out food and need housing resources" (pt-stated)       Current Barriers:  [name] Knowledge Deficits related to food and housing resources . Lacks caregiver support.  . Film/video editor.  . Chronic Disease Management support and education needs related to  Diabetes, Asthma, Stiff Person Syndrome  Nurse Case Manager Clinical Goal(s):  [name] Over the next 60 days, patient will work with embedded BSW  to address needs related to food and housing resources  Goal Met . New 03/18/2020 Over the next 90 days the patient will work with care management team to address ongoing concern of weight loss  CCM SW Interventions Completed 05/13/20 . Successful outbound call placed to the patient to assess progression of patient stated goal . The patient continues to report concerns with weight loss indicating "I am hovering right around 97 pounds" . Discussed the patient missed her previous office visit with her primary care provider and  has plans to be seen June 10th . Determined the patient is currently feeling under the weather o Reported productive cough with clear phlegm o The patient states she felt feverish earlier this week with chills but did not have a thermometer to check her temperature . Recent fall this week causing the patient to hit her side on the corner of a table o Patient reports pain when coughing due to injury . Collaboration with RN Care Manager [place] regarding fall and reported symptoms o Discussed possibility of patient having COVID and need for COVID test o Patient reports she feels this is only a cold and has not been around anyone with COVID symptoms o Confirmed the patient does have inhalers on hand should they be needed . Reviewed opportunity to assist with scheduling an office visit to assess pain in side  o Patient reports she does not have the energy to be seen at this time but will plan to call the practice next week if still feeling pain in her side . Advised the patient to call 911 if she begins to experience SOB . Scheduled follow up call over the next week   CCM RN CM Interventions:  02/06/20 late entry - completed call with patient  . Determined patient is no longer receiving food stamps due to being notified her income was over the limit; discussed this is causing her to run out of food and or she is unable to purchase healthy foods to help her adhere to an ADA diet . Determined she is no longer able to afford Ensure or Glucerna and would like coupons or resources for this nutritional supplement . Determined she may need resources for housing and or contact information if needed to report her property manager is not doing all things necessary to get her into an apartment that does not contain mold spores . Collaborated with embedded BSW Daneen Schick  regarding patient's request for food and housing resources and coupons for Glucerna . Discussed plans with patient for ongoing care  management follow up and provided patient with direct contact information for care management team  Patient Self Care Activities:  . Patient verbalizes understanding of plan to f/u with her property manager regarding moving her into a clean apartment that is mold free . Self administers medications as prescribed . Attends all scheduled provider appointments . Calls pharmacy for medication refills . Performs ADL's independently . Performs IADL's independently . Calls provider office for new concerns or questions  Please see past updates related to this goal by clicking on the "Past Updates" button in the selected goal          Follow Up Plan: SW will follow up with patient by phone over the next week.  Daneen Schick, BSW, CDP Social Worker, Certified Dementia Practitioner [place] / [place]  Management [phone]  Total time spent performing care coordination and/or care management activities with the patient by phone or face to face = 22 minutes.

## 2020-05-13 NOTE — Patient Instructions (Signed)
Social Worker Visit Information  Goals we discussed today:  Goals Addressed            This Visit's Progress     Patient Stated   . "I am running out food and need housing resources" (pt-stated)       Current Barriers:  [name] Knowledge Deficits related to food and housing resources . Lacks caregiver support.  . Film/video editor.  . 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 New 03/18/2020 Over the next 90 days the patient will work with care management team to address ongoing concern of weight loss  CCM SW Interventions Completed 05/13/20 . Successful outbound call placed to the patient to assess progression of patient stated goal . The patient continues to report concerns with weight loss indicating "I am hovering right around 97 pounds" . Discussed the patient missed her previous office visit with her primary care provider and has plans to be seen June 10th . Determined the patient is currently feeling under the weather o Reported productive cough with clear phlegm o The patient states she felt feverish earlier this week with chills but did not have a thermometer to check her temperature . Recent fall this week causing the patient to hit her side on the corner of a table o Patient reports pain when coughing due to injury . Collaboration with RN Care Manager Dyess regarding fall and reported symptoms o Discussed possibility of patient having COVID and need for COVID test o Patient reports she feels this is only a cold and has not been around anyone with COVID symptoms o Confirmed the patient does have inhalers on hand should they be needed . Reviewed opportunity to assist with scheduling an office visit to assess pain in side  o Patient reports  she does not have the energy to be seen at this time but will plan to call the practice next week if still feeling pain in her side . Advised the patient to call 911 if she begins to experience SOB . Scheduled follow up call over the next week   CCM RN CM Interventions:  02/06/20 late entry - completed call with patient  . Determined patient is no longer receiving food stamps due to being notified her income was over the limit; discussed this is causing her to run out of food and or she is unable to purchase healthy foods to help her adhere to an ADA diet . Determined she is no longer able to afford Ensure or Glucerna and would like coupons or resources for this nutritional supplement . Determined she may need resources for housing and or contact information if needed to report her property manager is not doing all things necessary to get her into an apartment that does not contain mold spores . Collaborated with embedded BSW Daneen Schick  regarding patient's request for food and housing resources and coupons for Glucerna . Discussed plans with patient for ongoing care management follow up and provided patient with direct contact information for care management team  Patient Self Care Activities:  . Patient verbalizes understanding of plan to f/u with her property manager regarding moving her into a clean apartment that is mold free . Self administers medications as prescribed . Attends all scheduled provider appointments .  Calls pharmacy for medication refills . Performs ADL's independently . Performs IADL's independently . Calls provider office for new concerns or questions  Please see past updates related to this goal by clicking on the "Past Updates" button in the selected goal         Follow Up Plan: SW will follow up with patient by phone over the next week.   Daneen Schick, BSW, CDP Social Worker, Certified Dementia Practitioner [place] [facility] [place] [facility] [phone]

## 2020-05-17 ENCOUNTER — Ambulatory Visit: Payer: Medicare Other

## 2020-05-17 DIAGNOSIS — E119 Type 2 diabetes mellitus without complications: Secondary | ICD-10-CM | POA: Diagnosis not present

## 2020-05-17 DIAGNOSIS — J45909 Unspecified asthma, uncomplicated: Secondary | ICD-10-CM | POA: Diagnosis not present

## 2020-05-17 NOTE — Chronic Care Management (AMB) (Signed)
Chronic Care Management    Social Work Follow Up Note  05/17/2020 Name: Sherri [name] MRN: [id number] DOB: 04/16/1966  Sherri [name] is a 54 y.o. year old female who is a primary care patient of Minette Brine, [place]. The CCM team was consulted for assistance with care coordination.   Review of patient status, including review of consultants reports, other relevant assessments, and collaboration with appropriate care team members and the patient's provider was performed as part of comprehensive patient evaluation and provision of chronic care management services.    SDOH (Social Determinants of Health) assessments performed: No    Outpatient Encounter Medications as of 05/17/2020  Medication Sig Note  . albuterol (PROAIR HFA) 108 (90 Base) MCG/ACT inhaler Inhale 2 puffs into the lungs every 6 (six) hours as needed for wheezing or shortness of breath.   . Ascorbic Acid (VITAMIN C) 1000 MG tablet Take 1,000 mg by mouth daily.   . baclofen (LIORESAL) 20 MG tablet TAKE 1 AND 1/2 TABLETS BY  MOUTH IN THE MORNING AND IN THE EVENING AND 1 TABLET AT MIDDAY   . cetirizine (ZYRTEC) 10 MG tablet TAKE 1 TABLET BY MOUTH EVERY DAY   . Cholecalciferol (VITAMIN D3) 5000 units CAPS Take by mouth.   . Cranberry-Vitamin C-Vitamin E (CRANBERRY PLUS VITAMIN C) 4200-20-3 MG-MG-UNIT CAPS Take by mouth.   . cyanocobalamin (,VITAMIN B-12,) 1000 MCG/ML injection Inject 1,000 mcg into the muscle every 3 (three) months.    . desloratadine (CLARINEX) 5 MG tablet Take 1 tablet (5 mg total) by mouth daily.   . diazepam (VALIUM) 10 MG tablet Take 2 tablets (20 mg total) by mouth 3 (three) times daily.   . diphenhydrAMINE (BENADRYL) 25 MG tablet Take 25 mg by mouth at bedtime as needed for itching or allergies.  01/17/2017: Takes as needed  . EPINEPHRINE 0.3 mg/0.3 mL IJ SOAJ injection INJECT INTRAMUSCULARLY  0.3ML AS NEEDED FOR  ANAPHYLAXIS . SEEK MEDICAL  ATTENTION AFTER USE. (Patient not taking: Reported on 03/10/2020)     . famotidine (PEPCID) 20 MG tablet TAKE 1 TABLET BY MOUTH TWICE A DAY   . FLUoxetine (PROZAC) 10 MG capsule TAKE 1 CAPSULE BY MOUTH  DAILY   . gabapentin (NEURONTIN) 300 MG capsule TAKE 1 CAPSULE BY MOUTH TWO TIMES DAILY   . glipiZIDE (GLUCOTROL) 5 MG tablet Take 5 mg by mouth daily before breakfast.   . Glucose Blood (ACCU-CHEK SIMPLICITY TEST STRP VI) by In Vitro route. Insert 1 by subcutaneous route 4 times every day check blood sugar before breakfast, lunch and dinner and bedtime   . hydrOXYzine (VISTARIL) 100 MG capsule TAKE 1 CAPSULE BY MOUTH 3  TIMES DAILY AS NEEDED FOR  ITCHING   . metFORMIN (GLUCOPHAGE) 500 MG tablet Take 1,000 mg by mouth 2 (two) times daily.   . Olopatadine HCl (PAZEO) 0.7 % SOLN Apply 1 drop to eye as needed.    . vitamin E (VITAMIN E) 400 UNIT capsule Take 400 Units by mouth daily.    No facility-administered encounter medications on file as of 05/17/2020.     Goals Addressed            This Visit's Progress     Patient Stated   . "I am running out food and need housing resources" (pt-stated)       Current Barriers:  [name] Knowledge Deficits related to food and housing resources . Lacks caregiver support.  . Film/video editor.  . Chronic Disease Management support and education needs related  to Diabetes, Asthma, Stiff Person Syndrome  Nurse Case Manager Clinical Goal(s):  [name] Over the next 60 days, patient will work with embedded BSW  to address needs related to food and housing resources  Goal Met . New 03/18/2020 Over the next 90 days the patient will work with care management team to address ongoing concern of weight loss  CCM SW Interventions Completed 05/17/20 . Successful outbound call placed to the patient to assess goal progression . Determined the patient is feeling better and no longer complaining of a cough . Discussed patient concern surrounding weight loss . Reviewed patient current eating habits o Patient reports mainly consuming canned  foods and fruit cocktail o Patient does still possess some frozen meals provided by SW but does not always have a "taste" for them o Patient denies intake of dairy or meat on a regular basis o Patient is out of Glucerna at this time o Patient does report access to farm fresh eggs as desired . Discussed importance of getting adequate protein in diet o Determined the patient is open to adding a nutritional supplement if her provider feels this would be beneficial . Collaboration with Minette Brine, FNP to update on patient current weight of 97 pounds and communicate patient willingness to add a supplement as needed . Collaboration with Octavio Manns to request feedback regarding Glucerna sample availability o Confirmed 8 cans of Glucerna have been placed in the front lobby for the patient to pick up o Successful outbound call placed to the patient to inform samples ready for pick up . Collaboration with [place] regarding patient goal progression . Confirmed patient knowledge of upcomming primary care appointment scheduled for June 10  CCM RN CM Interventions:  02/06/20 late entry - completed call with patient  . Determined patient is no longer receiving food stamps due to being notified her income was over the limit; discussed this is causing her to run out of food and or she is unable to purchase healthy foods to help her adhere to an ADA diet . Determined she is no longer able to afford Ensure or Glucerna and would like coupons or resources for this nutritional supplement . Determined she may need resources for housing and or contact information if needed to report her property manager is not doing all things necessary to get her into an apartment that does not contain mold spores . Collaborated with embedded BSW Daneen Schick  regarding patient's request for food and housing resources and coupons for Glucerna . Discussed plans with patient for ongoing care management follow  up and provided patient with direct contact information for care management team  Patient Self Care Activities:  . Patient verbalizes understanding of plan to f/u with her property manager regarding moving her into a clean apartment that is mold free . Self administers medications as prescribed . Attends all scheduled provider appointments . Calls pharmacy for medication refills . Performs ADL's independently . Performs IADL's independently . Calls provider office for new concerns or questions  Please see past updates related to this goal by clicking on the "Past Updates" button in the selected goal          Follow Up Plan: SW will follow up with the patient over the next month.   Daneen Schick, BSW, CDP Social Worker, Certified Dementia Practitioner [place] [facility] [place] [facility] [phone]  Total time spent performing care coordination and/or care management activities with the patient by phone or face to face =  23 minutes.

## 2020-05-17 NOTE — Patient Instructions (Signed)
Social Worker Visit Information  Goals we discussed today:  Goals Addressed            This Visit's Progress     Patient Stated   . "I am running out food and need housing resources" (pt-stated)       Current Barriers:  [name] Knowledge Deficits related to food and housing resources . Lacks caregiver support.  . Film/video editor.  . 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 New 03/18/2020 Over the next 90 days the patient will work with care management team to address ongoing concern of weight loss  CCM SW Interventions Completed 05/17/20 . Successful outbound call placed to the patient to assess goal progression . Determined the patient is feeling better and no longer complaining of a cough . Discussed patient concern surrounding weight loss . Reviewed patient current eating habits o Patient reports mainly consuming canned foods and fruit cocktail o Patient does still possess some frozen meals provided by SW but does not always have a "taste" for them o Patient denies intake of dairy or meat on a regular basis o Patient is out of Glucerna at this time o Patient does report access to farm fresh eggs as desired . Discussed importance of getting adequate protein in diet o Determined the patient is open to adding a nutritional supplement if her provider feels this would be beneficial . Collaboration with Minette Brine, FNP to update on patient current weight of 97 pounds and communicate patient willingness to add a supplement as needed . Collaboration with Octavio Manns to request feedback regarding Glucerna sample availability o Confirmed 8 cans of Glucerna have been placed in the front lobby for the patient to pick up o Successful outbound call placed  to the patient to inform samples ready for pick up . Collaboration with [place] regarding patient goal progression . Confirmed patient knowledge of upcomming primary care appointment scheduled for June 10  CCM RN CM Interventions:  02/06/20 late entry - completed call with patient  . Determined patient is no longer receiving food stamps due to being notified her income was over the limit; discussed this is causing her to run out of food and or she is unable to purchase healthy foods to help her adhere to an ADA diet . Determined she is no longer able to afford Ensure or Glucerna and would like coupons or resources for this nutritional supplement . Determined she may need resources for housing and or contact information if needed to report her property manager is not doing all things necessary to get her into an apartment that does not contain mold spores . Collaborated with embedded BSW Daneen Schick  regarding patient's request for food and housing resources and coupons for Glucerna . Discussed plans with patient for ongoing care management follow up and provided patient with direct contact information for care management team  Patient Self Care Activities:  . Patient verbalizes understanding of plan to f/u with her property manager regarding moving her into a clean apartment that is mold free . Self administers medications as prescribed . Attends all scheduled provider appointments . Calls pharmacy for medication refills . Performs ADL's independently . Performs IADL's independently . Calls provider office for new concerns or questions  Please see past updates related to this goal by clicking on the "Past Updates" button in the selected goal          Follow Up Plan: SW will follow up with patient by phone over the next month.   Daneen Schick, BSW, CDP Social Worker, Certified Dementia Practitioner [place] [facility] [place] [facility] [phone]

## 2020-05-19 ENCOUNTER — Other Ambulatory Visit: Payer: Self-pay | Admitting: Neurology

## 2020-05-19 ENCOUNTER — Other Ambulatory Visit: Payer: Self-pay | Admitting: Nurse Practitioner

## 2020-05-19 DIAGNOSIS — R21 Rash and other nonspecific skin eruption: Secondary | ICD-10-CM

## 2020-05-20 ENCOUNTER — Telehealth: Payer: Self-pay | Admitting: Nurse Practitioner

## 2020-05-20 NOTE — Chronic Care Management (AMB) (Signed)
  Care Management   Note  05/20/2020 Name: Sherri [name] MRN: [id number] DOB: 06-Jul-1966  Sherri [name] is a 54 y.o. year old female who is a primary care patient of Arnette Felts, FNP and is actively engaged with the care management team. I reached out to [name] by phone today to assist with re-scheduling an initial visit with the Pharmacist.  Follow up plan: Unsuccessful telephone outreach attempt made. A HIPPA compliant phone message was left for the patient providing contact information and requesting a return call. The care management team will reach out to the patient again over the next 7 days.  If patient returns call to provider office, please advise to call Embedded Care Management Care Guide Gwenevere Ghazi at [phone].  Gwenevere Ghazi  Care Guide, Embedded Care Coordination [facility]  [place], [place] [address] Direct Dial: [phone] [email or web address] Website: [email or web address]

## 2020-05-21 ENCOUNTER — Other Ambulatory Visit: Payer: Self-pay | Admitting: Nurse Practitioner

## 2020-05-21 DIAGNOSIS — E119 Type 2 diabetes mellitus without complications: Secondary | ICD-10-CM

## 2020-05-21 NOTE — Chronic Care Management (AMB) (Signed)
  Care Management   Note  05/21/2020 Name: Sherri [name] MRN: [id number] DOB: 1966/04/25  Sherri [name] is a 54 y.o. year old female who is a primary care patient of Arnette Felts, FNP and is actively engaged with the care management team. I reached out to [name] by phone today to assist with re-scheduling an initial visit with the Pharmacist.  Follow up plan: Telephone appointment with care management team member scheduled for:06/25/2020  [facility] Guide, Embedded Care Coordination [facility]  [place], [place] [address] Direct Dial: [phone] [email or web address] Website: [email or web address]

## 2020-05-29 ENCOUNTER — Other Ambulatory Visit: Payer: Self-pay | Admitting: Neurology

## 2020-06-01 ENCOUNTER — Other Ambulatory Visit: Payer: Self-pay

## 2020-06-01 ENCOUNTER — Ambulatory Visit: Payer: Self-pay

## 2020-06-01 ENCOUNTER — Telehealth: Payer: Medicare Other

## 2020-06-01 DIAGNOSIS — G2582 Stiff-man syndrome: Secondary | ICD-10-CM

## 2020-06-01 DIAGNOSIS — E119 Type 2 diabetes mellitus without complications: Secondary | ICD-10-CM

## 2020-06-01 DIAGNOSIS — J45909 Unspecified asthma, uncomplicated: Secondary | ICD-10-CM

## 2020-06-02 NOTE — Chronic Care Management (AMB) (Signed)
Chronic Care Management   Follow Up Note   06/02/2020 Name: Sherri [name] MRN: [id number] DOB: 1966/11/02  Referred by: Minette Brine, FNP Reason for referral : Chronic Care Management (FU RN Call-Asthma/DM/weight loss)   Sherri [name] is a 54 y.o. year old female who is a primary care patient of Minette Brine, [place]. The CCM team was consulted for assistance with chronic disease management and care coordination needs.    Review of patient status, including review of consultants reports, relevant laboratory and other test results, and collaboration with appropriate care team members and the patient's provider was performed as part of comprehensive patient evaluation and provision of chronic care management services.    SDOH (Social Determinants of Health) assessments performed: No See Care Plan activities for detailed interventions related to [facility])   Reviewed chart in preparation to contact patient, noted patient has PCP f/u scheduled for 06/03/20. CCM RN CM follow up call rescheduled to coordinate with PCP OV.     Outpatient Encounter Medications as of 06/01/2020  Medication Sig Note  . albuterol (PROAIR HFA) 108 (90 Base) MCG/ACT inhaler Inhale 2 puffs into the lungs every 6 (six) hours as needed for wheezing or shortness of breath.   . Ascorbic Acid (VITAMIN C) 1000 MG tablet Take 1,000 mg by mouth daily.   . baclofen (LIORESAL) 20 MG tablet TAKE 1 AND 1/2 TABLETS BY  MOUTH IN THE MORNING AND IN THE EVENING AND 1 TABLET AT MIDDAY   . cetirizine (ZYRTEC) 10 MG tablet TAKE 1 TABLET BY MOUTH EVERY DAY   . Cholecalciferol (VITAMIN D3) 5000 units CAPS Take by mouth.   . Cranberry-Vitamin C-Vitamin E (CRANBERRY PLUS VITAMIN C) 4200-20-3 MG-MG-UNIT CAPS Take by mouth.   . cyanocobalamin (,VITAMIN B-12,) 1000 MCG/ML injection Inject 1,000 mcg into the muscle every 3 (three) months.    . desloratadine (CLARINEX) 5 MG tablet TAKE 1 TABLET BY MOUTH  DAILY   . diazepam (VALIUM) 10 MG tablet Take  2 tablets (20 mg total) by mouth 3 (three) times daily.   . diphenhydrAMINE (BENADRYL) 25 MG tablet Take 25 mg by mouth at bedtime as needed for itching or allergies.  01/17/2017: Takes as needed  . EPINEPHRINE 0.3 mg/0.3 mL IJ SOAJ injection INJECT INTRAMUSCULARLY  0.3ML AS NEEDED FOR  ANAPHYLAXIS . SEEK MEDICAL  ATTENTION AFTER USE.   . famotidine (PEPCID) 20 MG tablet TAKE 1 TABLET BY MOUTH TWICE A DAY   . FLUoxetine (PROZAC) 10 MG capsule TAKE 1 CAPSULE BY MOUTH  DAILY   . gabapentin (NEURONTIN) 300 MG capsule TAKE 1 CAPSULE BY MOUTH TWO TIMES DAILY   . glipiZIDE (GLUCOTROL) 5 MG tablet Take 5 mg by mouth daily before breakfast.   . Glucose Blood (ACCU-CHEK SIMPLICITY TEST STRP VI) by In Vitro route. Insert 1 by subcutaneous route 4 times every day check blood sugar before breakfast, lunch and dinner and bedtime   . hydrOXYzine (VISTARIL) 100 MG capsule TAKE 1 CAPSULE BY MOUTH 3  TIMES DAILY AS NEEDED FOR  ITCHING   . metFORMIN (GLUCOPHAGE) 500 MG tablet TAKE 2 TABLETS (1,000 MG TOTAL) BY MOUTH 2 (TWO) TIMES DAILY WITH A MEAL.   [name] Olopatadine HCl (PAZEO) 0.7 % SOLN Apply 1 drop to eye as needed.    . vitamin E (VITAMIN E) 400 UNIT capsule Take 400 Units by mouth daily.    No facility-administered encounter medications on file as of 06/01/2020.     Objective:  Lab Results  Component Value Date  HGBA1C 8.5 (H) 01/15/2020   HGBA1C 5.9 (H) 04/23/2019   HGBA1C 5.8 09/05/2018   Lab Results  Component Value Date   CREATININE 1.26 (H) 03/10/2020   BP Readings from Last 3 Encounters:  03/10/20 106/86  02/16/20 114/80  01/15/20 114/70   Plan:   Telephone follow up appointment with care management team member scheduled for: 06/04/20  Delsa Sale, RN, BSN, CCM Care Management Coordinator [facility] Care Management/[facility]  Direct Phone: [phone]

## 2020-06-03 ENCOUNTER — Telehealth: Payer: Self-pay

## 2020-06-03 ENCOUNTER — Ambulatory Visit (INDEPENDENT_AMBULATORY_CARE_PROVIDER_SITE_OTHER): Payer: Medicare Other | Admitting: Nurse Practitioner

## 2020-06-03 ENCOUNTER — Other Ambulatory Visit: Payer: Self-pay

## 2020-06-03 ENCOUNTER — Ambulatory Visit (INDEPENDENT_AMBULATORY_CARE_PROVIDER_SITE_OTHER): Payer: Medicare Other

## 2020-06-03 ENCOUNTER — Other Ambulatory Visit: Payer: Self-pay | Admitting: Nurse Practitioner

## 2020-06-03 ENCOUNTER — Encounter: Payer: Self-pay | Admitting: Nurse Practitioner

## 2020-06-03 VITALS — BP 118/68 | HR 105 | Temp 98.0°F | Ht 63.0 in | Wt 102.2 lb

## 2020-06-03 VITALS — BP 118/68 | HR 115 | Temp 98.0°F | Wt 102.2 lb

## 2020-06-03 DIAGNOSIS — R634 Abnormal weight loss: Secondary | ICD-10-CM | POA: Diagnosis not present

## 2020-06-03 DIAGNOSIS — J302 Other seasonal allergic rhinitis: Secondary | ICD-10-CM | POA: Diagnosis not present

## 2020-06-03 DIAGNOSIS — F1411 Cocaine abuse, in remission: Secondary | ICD-10-CM | POA: Diagnosis not present

## 2020-06-03 DIAGNOSIS — D510 Vitamin B12 deficiency anemia due to intrinsic factor deficiency: Secondary | ICD-10-CM | POA: Diagnosis not present

## 2020-06-03 DIAGNOSIS — Z0000 Encounter for general adult medical examination without abnormal findings: Secondary | ICD-10-CM | POA: Diagnosis not present

## 2020-06-03 DIAGNOSIS — E119 Type 2 diabetes mellitus without complications: Secondary | ICD-10-CM

## 2020-06-03 DIAGNOSIS — Z79899 Other long term (current) drug therapy: Secondary | ICD-10-CM

## 2020-06-03 DIAGNOSIS — Z1231 Encounter for screening mammogram for malignant neoplasm of breast: Secondary | ICD-10-CM | POA: Diagnosis not present

## 2020-06-03 NOTE — Patient Instructions (Signed)
Sherri [name] , Thank you for taking time to come for your Medicare Wellness Visit. I appreciate your ongoing commitment to your health goals. Please review the following plan we discussed and let me know if I can assist you in the future.   Screening recommendations/referrals: Colonoscopy: 09/2014 Mammogram: referred Bone Density: n/a Recommended yearly ophthalmology/optometry visit for glaucoma screening and checkup Recommended yearly dental visit for hygiene and checkup  Vaccinations: Influenza vaccine: decline Pneumococcal vaccine: decline Tdap vaccine: decline Shingles vaccine: decline    Advanced directives: Advance directive discussed with you today. Even though you declined this today please call our office should you change your mind and we can give you the proper paperwork for you to fill out.  Conditions/risks identified: underweight  Next appointment:   Preventive Care 40-64 Years, Female Preventive care refers to lifestyle choices and visits with your health care provider that can promote health and wellness. What does preventive care include?  A yearly physical exam. This is also called an annual well check.  Dental exams once or twice a year.  Routine eye exams. Ask your health care provider how often you should have your eyes checked.  Personal lifestyle choices, including:  Daily care of your teeth and gums.  Regular physical activity.  Eating a healthy diet.  Avoiding tobacco and drug use.  Limiting alcohol use.  Practicing safe sex.  Taking low-dose aspirin daily starting at age 4.  Taking vitamin and mineral supplements as recommended by your health care provider. What happens during an annual well check? The services and screenings done by your health care provider during your annual well check will depend on your age, overall health, lifestyle risk factors, and family history of disease. Counseling  Your health care provider may ask you  questions about your:  Alcohol use.  Tobacco use.  Drug use.  Emotional well-being.  Home and relationship well-being.  Sexual activity.  Eating habits.  Work and work Statistician.  Method of birth control.  Menstrual cycle.  Pregnancy history. Screening  You may have the following tests or measurements:  Height, weight, and BMI.  Blood pressure.  Lipid and cholesterol levels. These may be checked every 5 years, or more frequently if you are over 77 years old.  Skin check.  Lung cancer screening. You may have this screening every year starting at age 34 if you have a 30-pack-year history of smoking and currently smoke or have quit within the past 15 years.  Fecal occult blood test (FOBT) of the stool. You may have this test every year starting at age 45.  Flexible sigmoidoscopy or colonoscopy. You may have a sigmoidoscopy every 5 years or a colonoscopy every 10 years starting at age 19.  Hepatitis C blood test.  Hepatitis B blood test.  Sexually transmitted disease (STD) testing.  Diabetes screening. This is done by checking your blood sugar (glucose) after you have not eaten for a while (fasting). You may have this done every 1-3 years.  Mammogram. This may be done every 1-2 years. Talk to your health care provider about when you should start having regular mammograms. This may depend on whether you have a family history of breast cancer.  BRCA-related cancer screening. This may be done if you have a family history of breast, ovarian, tubal, or peritoneal cancers.  Pelvic exam and Pap test. This may be done every 3 years starting at age 45. Starting at age 79, this may be done every 5 years if you have  a Pap test in combination with an HPV test.  Bone density scan. This is done to screen for osteoporosis. You may have this scan if you are at high risk for osteoporosis. Discuss your test results, treatment options, and if necessary, the need for more tests with  your health care provider. Vaccines  Your health care provider may recommend certain vaccines, such as:  Influenza vaccine. This is recommended every year.  Tetanus, diphtheria, and acellular pertussis (Tdap, Td) vaccine. You may need a Td booster every 10 years.  Zoster vaccine. You may need this after age 27.  Pneumococcal 13-valent conjugate (PCV13) vaccine. You may need this if you have certain conditions and were not previously vaccinated.  Pneumococcal polysaccharide (PPSV23) vaccine. You may need one or two doses if you smoke cigarettes or if you have certain conditions. Talk to your health care provider about which screenings and vaccines you need and how often you need them. This information is not intended to replace advice given to you by your health care provider. Make sure you discuss any questions you have with your health care provider. Document Released: 01/07/2016 Document Revised: 08/30/2016 Document Reviewed: 10/12/2015 Elsevier Interactive Patient Education  2017 [employer] Prevention in the Home Falls can cause injuries. They can happen to people of all ages. There are many things you can do to make your home safe and to help prevent falls. What can I do on the outside of my home?  Regularly fix the edges of walkways and driveways and fix any cracks.  Remove anything that might make you trip as you walk through a door, such as a raised step or threshold.  Trim any bushes or trees on the path to your home.  Use bright outdoor lighting.  Clear any walking paths of anything that might make someone trip, such as rocks or tools.  Regularly check to see if handrails are loose or broken. Make sure that both sides of any steps have handrails.  Any raised decks and porches should have guardrails on the edges.  Have any leaves, snow, or ice cleared regularly.  Use sand or salt on walking paths during winter.  Clean up any spills in your garage right  away. This includes oil or grease spills. What can I do in the bathroom?  Use night lights.  Install grab bars by the toilet and in the tub and shower. Do not use towel bars as grab bars.  Use non-skid mats or decals in the tub or shower.  If you need to sit down in the shower, use a plastic, non-slip stool.  Keep the floor dry. Clean up any water that spills on the floor as soon as it happens.  Remove soap buildup in the tub or shower regularly.  Attach bath mats securely with double-sided non-slip rug tape.  Do not have throw rugs and other things on the floor that can make you trip. What can I do in the bedroom?  Use night lights.  Make sure that you have a light by your bed that is easy to reach.  Do not use any sheets or blankets that are too big for your bed. They should not hang down onto the floor.  Have a firm chair that has side arms. You can use this for support while you get dressed.  Do not have throw rugs and other things on the floor that can make you trip. What can I do in the kitchen?  Clean up any spills right away.  Avoid walking on wet floors.  Keep items that you use a lot in easy-to-reach places.  If you need to reach something above you, use a strong step stool that has a grab bar.  Keep electrical cords out of the way.  Do not use floor polish or wax that makes floors slippery. If you must use wax, use non-skid floor wax.  Do not have throw rugs and other things on the floor that can make you trip. What can I do with my stairs?  Do not leave any items on the stairs.  Make sure that there are handrails on both sides of the stairs and use them. Fix handrails that are broken or loose. Make sure that handrails are as long as the stairways.  Check any carpeting to make sure that it is firmly attached to the stairs. Fix any carpet that is loose or worn.  Avoid having throw rugs at the top or bottom of the stairs. If you do have throw rugs, attach  them to the floor with carpet tape.  Make sure that you have a light switch at the top of the stairs and the bottom of the stairs. If you do not have them, ask someone to add them for you. What else can I do to help prevent falls?  Wear shoes that:  Do not have high heels.  Have rubber bottoms.  Are comfortable and fit you well.  Are closed at the toe. Do not wear sandals.  If you use a stepladder:  Make sure that it is fully opened. Do not climb a closed stepladder.  Make sure that both sides of the stepladder are locked into place.  Ask someone to hold it for you, if possible.  Clearly mark and make sure that you can see:  Any grab bars or handrails.  First and last steps.  Where the edge of each step is.  Use tools that help you move around (mobility aids) if they are needed. These include:  Canes.  Walkers.  Scooters.  Crutches.  Turn on the lights when you go into a dark area. Replace any light bulbs as soon as they burn out.  Set up your furniture so you have a clear path. Avoid moving your furniture around.  If any of your floors are uneven, fix them.  If there are any pets around you, be aware of where they are.  Review your medicines with your doctor. Some medicines can make you feel dizzy. This can increase your chance of falling. Ask your doctor what other things that you can do to help prevent falls. This information is not intended to replace advice given to you by your health care provider. Make sure you discuss any questions you have with your health care provider. Document Released: 10/07/2009 Document Revised: 05/18/2016 Document Reviewed: 01/15/2015 Elsevier Interactive Patient Education  2017 [employer].

## 2020-06-03 NOTE — Progress Notes (Signed)
This visit occurred during the SARS-CoV-2 public health emergency.  Safety protocols were in place, including screening questions prior to the visit, additional usage of staff PPE, and extensive cleaning of exam room while observing appropriate contact time as indicated for disinfecting solutions.  Subjective:     Patient ID: Sherri [name] , female    DOB: 1966-11-02 , 54 y.o.   MRN: [id number]   Chief Complaint  Patient presents with  . Diabetes    HPI  Wt Readings from Last 3 Encounters: 06/03/20 : 102 lb 3.2 oz (46.4 kg) 06/03/20 : 102 lb 3.2 oz (46.4 kg) 03/10/20 : 106 lb (48.1 kg)  She is trying to eat more protein. She is trying to do better with her protein, reports her appetite is low. Her weight a year ago in April was 111 lbs. She has not been to the GI provider with the recent weight loss.   Diabetes She presents for her follow-up diabetic visit. She has type 2 diabetes mellitus. Her disease course has been stable. Pertinent negatives for hypoglycemia include no dizziness or headaches. There are no diabetic associated symptoms. Pertinent negatives for diabetes include no chest pain, no polydipsia, no polyphagia and no polyuria. There are no hypoglycemic complications. There are no diabetic complications. Risk factors for coronary artery disease include sedentary lifestyle. Current diabetic treatment includes oral agent (dual therapy) and oral agent (monotherapy). She is compliant with treatment most of the time. There is no change in her home blood glucose trend. (Blood sugar a few days ago was 109 after eating a pimento cheese sandwich with the highest 129. )     Past Medical History:  Diagnosis Date  . Angioedema   . Anxiety   . Arthritis   . Asthma   . Benzodiazepine withdrawal ([place]) 08/30/2012  . CAP (community acquired pneumonia) 08/29/2012  . Chronic back pain   . Chronic kidney disease   . Closed nondisplaced fracture of proximal phalanx of right little finger  10/18/2018  . Cocaine abuse ([place])   . Depression   . Diarrhea   . DVT (deep venous thrombosis) ([place])   . Environmental allergies   . Fall   . GERD (gastroesophageal reflux disease)   . Heart murmur    has been told once that she has a heart murmur, but has never had any problems  . Hiatal hernia 03/21/2013  . Lumbar herniated disc   . Peripheral vascular disease ([place])   . Pernicious anemia 10/30/2011  . Postconcussion syndrome 12/30/2014  . Rhabdomyolysis 08/27/2012  . Seasonal allergies   . Stiff person syndrome   . Urticaria      Family History  Problem Relation Age of Onset  . Cancer Mother   . Other Mother   . COPD Father   . Asthma Brother   . Cancer Brother        colon  . Heart attack Brother   . Seizures Brother   . Allergic rhinitis Neg Hx   . Angioedema Neg Hx   . Eczema Neg Hx   . Immunodeficiency Neg Hx   . Urticaria Neg Hx      Current Outpatient Medications:  .  albuterol (PROAIR HFA) 108 (90 Base) MCG/ACT inhaler, Inhale 2 puffs into the lungs every 6 (six) hours as needed for wheezing or shortness of breath., Disp: 18 g, Rfl: 1 .  Ascorbic Acid (VITAMIN C) 1000 MG tablet, Take 1,000 mg by mouth daily., Disp: , Rfl:  .  baclofen (LIORESAL)  20 MG tablet, TAKE 1 AND 1/2 TABLETS BY  MOUTH IN THE MORNING AND IN THE EVENING AND 1 TABLET AT MIDDAY, Disp: 360 tablet, Rfl: 3 .  cetirizine (ZYRTEC) 10 MG tablet, TAKE 1 TABLET BY MOUTH EVERY DAY, Disp: 30 tablet, Rfl: 2 .  Cholecalciferol (VITAMIN D3) 5000 units CAPS, Take by mouth., Disp: , Rfl:  .  Cranberry-Vitamin C-Vitamin E (CRANBERRY PLUS VITAMIN C) 4200-20-3 MG-MG-UNIT CAPS, Take by mouth., Disp: , Rfl:  .  cyanocobalamin (,VITAMIN B-12,) 1000 MCG/ML injection, Inject 1,000 mcg into the muscle every 3 (three) months. , Disp: , Rfl:  .  desloratadine (CLARINEX) 5 MG tablet, TAKE 1 TABLET BY MOUTH  DAILY, Disp: 90 tablet, Rfl: 0 .  diazepam (VALIUM) 10 MG tablet, Take 2 tablets (20 mg total) by mouth 3 (three)  times daily., Disp: 180 tablet, Rfl: 3 .  diphenhydrAMINE (BENADRYL) 25 MG tablet, Take 25 mg by mouth at bedtime as needed for itching or allergies. , Disp: , Rfl:  .  EPINEPHRINE 0.3 mg/0.3 mL IJ SOAJ injection, INJECT INTRAMUSCULARLY  0.3ML AS NEEDED FOR  ANAPHYLAXIS . SEEK MEDICAL  ATTENTION AFTER USE., Disp: 2 each, Rfl: 0 .  famotidine (PEPCID) 20 MG tablet, TAKE 1 TABLET BY MOUTH TWICE A DAY, Disp: 60 tablet, Rfl: 0 .  FLUoxetine (PROZAC) 10 MG capsule, TAKE 1 CAPSULE BY MOUTH  DAILY, Disp: 90 capsule, Rfl: 3 .  gabapentin (NEURONTIN) 300 MG capsule, TAKE 1 CAPSULE BY MOUTH TWO TIMES DAILY, Disp: 180 capsule, Rfl: 3 .  glipiZIDE (GLUCOTROL) 5 MG tablet, Take 5 mg by mouth daily before breakfast., Disp: , Rfl:  .  Glucose Blood (ACCU-CHEK SIMPLICITY TEST STRP VI), by In Vitro route. Insert 1 by subcutaneous route 4 times every day check blood sugar before breakfast, lunch and dinner and bedtime, Disp: , Rfl:  .  hydrOXYzine (VISTARIL) 100 MG capsule, TAKE 1 CAPSULE BY MOUTH 3  TIMES DAILY AS NEEDED FOR  ITCHING, Disp: 90 capsule, Rfl: 0 .  metFORMIN (GLUCOPHAGE) 500 MG tablet, TAKE 2 TABLETS (1,000 MG TOTAL) BY MOUTH 2 (TWO) TIMES DAILY WITH A MEAL., Disp: 360 tablet, Rfl: 1 .  Olopatadine HCl (PAZEO) 0.7 % SOLN, Apply 1 drop to eye as needed. , Disp: , Rfl:  .  vitamin E (VITAMIN E) 400 UNIT capsule, Take 400 Units by mouth daily., Disp: , Rfl:    Allergies  Allergen Reactions  . Ibuprofen Other (See Comments)    Does not take due to hx of renal insufficiency "I have kidney disease"   . Lemon Flavor Swelling    Severe Lip Swelling  FRUIT per pt.    . Amoxicillin Diarrhea and Other (See Comments)  . Tylenol [Acetaminophen] Hives    Cannot take large quantities     Review of Systems  Constitutional: Positive for unexpected weight change (decreased in weight ).  Respiratory: Negative.   Cardiovascular: Negative.  Negative for chest pain, palpitations and leg swelling.   Gastrointestinal: Negative for abdominal distention, abdominal pain, constipation, diarrhea and nausea.  Endocrine: Negative for polydipsia, polyphagia and polyuria.  Neurological: Negative for dizziness and headaches.  Psychiatric/Behavioral: Negative.      Today's Vitals   06/03/20 1544  BP: 118/68  Pulse: (!) 115  Temp: 98 F (36.7 C)  TempSrc: Oral  Weight: 102 lb 3.2 oz (46.4 kg)   Body mass index is 18.1 kg/m.   Objective:  Physical Exam Constitutional:      Appearance: Normal appearance.  Cardiovascular:  Rate and Rhythm: Normal rate and regular rhythm.     Pulses: Normal pulses.     Heart sounds: Normal heart sounds. No murmur heard.   Pulmonary:     Effort: Pulmonary effort is normal. No respiratory distress.     Breath sounds: Normal breath sounds.  Skin:    Capillary Refill: Capillary refill takes less than 2 seconds.  Neurological:     General: No focal deficit present.     Mental Status: She is alert and oriented to person, place, and time.  Psychiatric:        Mood and Affect: Mood normal.        Behavior: Behavior normal.        Thought Content: Thought content normal.        Judgment: Judgment normal.         Assessment And Plan:     1. Type 2 diabetes mellitus without complication, without long-term current use of insulin (HCC)  Chronic, was elevated at last visit  Will check levels and advised to avoid sugary foods and drinks - Lipid panel - Hemoglobin A1c  2. Abnormal weight loss  I will check a urine drug sceen due to the patient previous history of cocaine use, she denies any drug use at this time  Continue with the Ensure that was ordered for her - TSH - Vitamin B12 - CMP14+EGFR - CBC  3. Pernicious anemia  Will check her vitamin B12 levels - Vitamin B12  4. History of cocaine abuse ([place])  Will check urine drug screen   Minette Brine, FNP    THE PATIENT IS ENCOURAGED TO PRACTICE SOCIAL DISTANCING DUE TO THE  COVID-19 PANDEMIC.

## 2020-06-03 NOTE — Progress Notes (Signed)
This visit occurred during the SARS-CoV-2 public health emergency.  Safety protocols were in place, including screening questions prior to the visit, additional usage of staff PPE, and extensive cleaning of exam room while observing appropriate contact time as indicated for disinfecting solutions.  Subjective:   Sherri [name] is a 54 y.o. female who presents for Medicare Annual (Subsequent) preventive examination.  Review of Systems:  n/a Cardiac Risk Factors include: diabetes mellitus;sedentary lifestyle     Objective:     Vitals: BP 118/68   Pulse (!) 105   Temp 98 F (36.7 C) (Oral)   Ht '5\' 3"'$  (1.6 m)   Wt 102 lb 3.2 oz (46.4 kg)   LMP 07/29/2012 (Approximate)   BMI 18.10 kg/m   Body mass index is 18.1 kg/m.  Advanced Directives 06/03/2020 03/10/2020 11/10/2019 07/04/2019 04/22/2019 02/25/2019 11/04/2018  Does Patient Have a Medical Advance Directive? No No No No No No No  Would patient like information on creating a medical advance directive? No - Patient declined No - Patient declined No - Patient declined No - Patient declined - No - Patient declined -  Pre-existing out of facility DNR order (yellow form or pink MOST form) - - - - - - -    Tobacco Social History   Tobacco Use  Smoking Status Never Smoker  Smokeless Tobacco Never Used  Tobacco Comment   Never Used Tobacco     Counseling given: Not Answered Comment: Never Used Tobacco   Clinical Intake:  Pre-visit preparation completed: Yes  Pain : No/denies pain     Nutritional Status: BMI <19  Underweight Nutritional Risks: Nausea/ vomitting/ diarrhea (vomitted watery substance twice last 2 months)  How often do you need to have someone help you when you read instructions, pamphlets, or other written materials from your doctor or pharmacy?: 1 - Never What is the last grade level you completed in school?: some college  Interpreter Needed?: No  Information entered by :: NAllen LPN  Past Medical History:    Diagnosis Date  . Angioedema   . Anxiety   . Arthritis   . Asthma   . Benzodiazepine withdrawal ([place]) 08/30/2012  . CAP (community acquired pneumonia) 08/29/2012  . Chronic back pain   . Chronic kidney disease   . Closed nondisplaced fracture of proximal phalanx of right little finger 10/18/2018  . Cocaine abuse ([place])   . Depression   . Diarrhea   . DVT (deep venous thrombosis) ([place])   . Environmental allergies   . Fall   . GERD (gastroesophageal reflux disease)   . Heart murmur    has been told once that she has a heart murmur, but has never had any problems  . Hiatal hernia 03/21/2013  . Lumbar herniated disc   . Peripheral vascular disease ([place])   . Pernicious anemia 10/30/2011  . Postconcussion syndrome 12/30/2014  . Rhabdomyolysis 08/27/2012  . Seasonal allergies   . Stiff person syndrome   . Urticaria    Past Surgical History:  Procedure Laterality Date  . BUNIONECTOMY Left   . colonoscopy  07/09/15  . inguinal hernia 1983  1983  . OPEN REDUCTION INTERNAL FIXATION (ORIF) DISTAL RADIAL FRACTURE Right 08/16/2016   Procedure: OPEN REDUCTION INTERNAL FIXATION (ORIF) DISTAL RADIAL FRACTURE;  Surgeon: Marybelle Killings, MD;  Location: [facility];  Service: Orthopedics;  Laterality: Right;   Family History  Problem Relation Age of Onset  . Cancer Mother   . Other Mother   . COPD Father   .  Asthma Brother   . Cancer Brother        colon  . Heart attack Brother   . Seizures Brother   . Allergic rhinitis Neg Hx   . Angioedema Neg Hx   . Eczema Neg Hx   . Immunodeficiency Neg Hx   . Urticaria Neg Hx    Social History   Socioeconomic History  . Marital status: Divorced    Spouse name: Not on file  . Number of children: 1  . Years of education: college  . Highest education level: Not on file  Occupational History  . Occupation: disabled  Tobacco Use  . Smoking status: Never Smoker  . Smokeless tobacco: Never Used  . Tobacco comment: Never Used Tobacco  Vaping Use  . Vaping  Use: Never used  Substance and Sexual Activity  . Alcohol use: Not Currently    Alcohol/week: 0.0 standard drinks    Comment: Occasional  . Drug use: Not Currently  . Sexual activity: Not Currently    Birth control/protection: Condom  Other Topics Concern  . Not on file  Social History Narrative   Patient is right and left handed.   Patient drinks some caffeine occasionally.   Social Determinants of Health   Financial Resource Strain: Low Risk   . Difficulty of Paying Living Expenses: Not hard at all  Food Insecurity: No Food Insecurity  . Worried About Charity fundraiser in the Last Year: Never true  . Ran Out of Food in the Last Year: Never true  Transportation Needs: No Transportation Needs  . Lack of Transportation (Medical): No  . Lack of Transportation (Non-Medical): No  Physical Activity: Inactive  . Days of Exercise per Week: 0 days  . Minutes of Exercise per Session: 0 min  Stress: No Stress Concern Present  . Feeling of Stress : Not at all  Social Connections:   . Frequency of Communication with Friends and Family:   . Frequency of Social Gatherings with Friends and Family:   . Attends Religious Services:   . Active Member of Clubs or Organizations:   . Attends Archivist Meetings:   [name] Marital Status:     Outpatient Encounter Medications as of 06/03/2020  Medication Sig  . albuterol (PROAIR HFA) 108 (90 Base) MCG/ACT inhaler Inhale 2 puffs into the lungs every 6 (six) hours as needed for wheezing or shortness of breath.  . Ascorbic Acid (VITAMIN C) 1000 MG tablet Take 1,000 mg by mouth daily.  . baclofen (LIORESAL) 20 MG tablet TAKE 1 AND 1/2 TABLETS BY  MOUTH IN THE MORNING AND IN THE EVENING AND 1 TABLET AT MIDDAY  . cetirizine (ZYRTEC) 10 MG tablet TAKE 1 TABLET BY MOUTH EVERY DAY  . Cholecalciferol (VITAMIN D3) 5000 units CAPS Take by mouth.  . Cranberry-Vitamin C-Vitamin E (CRANBERRY PLUS VITAMIN C) 4200-20-3 MG-MG-UNIT CAPS Take by mouth.  .  cyanocobalamin (,VITAMIN B-12,) 1000 MCG/ML injection Inject 1,000 mcg into the muscle every 3 (three) months.   . desloratadine (CLARINEX) 5 MG tablet TAKE 1 TABLET BY MOUTH  DAILY  . diazepam (VALIUM) 10 MG tablet Take 2 tablets (20 mg total) by mouth 3 (three) times daily.  . diphenhydrAMINE (BENADRYL) 25 MG tablet Take 25 mg by mouth at bedtime as needed for itching or allergies.   [name] EPINEPHRINE 0.3 mg/0.3 mL IJ SOAJ injection INJECT INTRAMUSCULARLY  0.3ML AS NEEDED FOR  ANAPHYLAXIS . SEEK MEDICAL  ATTENTION AFTER USE.  . famotidine (PEPCID) 20 MG  tablet TAKE 1 TABLET BY MOUTH TWICE A DAY  . FLUoxetine (PROZAC) 10 MG capsule TAKE 1 CAPSULE BY MOUTH  DAILY  . gabapentin (NEURONTIN) 300 MG capsule TAKE 1 CAPSULE BY MOUTH TWO TIMES DAILY  . glipiZIDE (GLUCOTROL) 5 MG tablet Take 5 mg by mouth daily before breakfast.  . Glucose Blood (ACCU-CHEK SIMPLICITY TEST STRP VI) by In Vitro route. Insert 1 by subcutaneous route 4 times every day check blood sugar before breakfast, lunch and dinner and bedtime  . hydrOXYzine (VISTARIL) 100 MG capsule TAKE 1 CAPSULE BY MOUTH 3  TIMES DAILY AS NEEDED FOR  ITCHING  . metFORMIN (GLUCOPHAGE) 500 MG tablet TAKE 2 TABLETS (1,000 MG TOTAL) BY MOUTH 2 (TWO) TIMES DAILY WITH A MEAL.  [name] Olopatadine HCl (PAZEO) 0.7 % SOLN Apply 1 drop to eye as needed.   . vitamin E (VITAMIN E) 400 UNIT capsule Take 400 Units by mouth daily.   No facility-administered encounter medications on file as of 06/03/2020.    Activities of Daily Living In your present state of health, do you have any difficulty performing the following activities: 06/03/2020 06/03/2020  Hearing? N N  Vision? N N  Difficulty concentrating or making decisions? N N  Walking or climbing stairs? N N  Dressing or bathing? N N  Doing errands, shopping? N N  Preparing Food and eating ? N -  Using the Toilet? N -  In the past six months, have you accidently leaked urine? Y -  Do you have problems with loss of  bowel control? N -  Managing your Medications? N -  Managing your Finances? N -  Housekeeping or managing your Housekeeping? N -  Some recent data might be hidden    Patient Care Team: Minette Brine, FNP as PCP - General (General Practice) Little, Claudette Stapler, RN as [facility] [place], [place] manager as Social Worker [place], Kennieth Francois, [facility] (Pharmacist)    Assessment:   This is a routine wellness examination for [facility].  Exercise Activities and Dietary recommendations Current Exercise Habits: The patient does not participate in regular exercise at present  Goals    .  "I am running out food and need housing resources" (pt-stated)      Current Barriers:  [name] Knowledge Deficits related to food and housing resources . Lacks caregiver support.  . Film/video editor.  . 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 New 03/18/2020 Over the next 90 days the patient will work with care management team to address ongoing concern of weight loss  CCM SW Interventions Completed 05/17/20 . Successful outbound call placed to the patient to assess goal progression . Determined the patient is feeling better and no longer complaining of a cough . Discussed patient concern surrounding weight loss . Reviewed patient current eating habits o Patient reports mainly consuming canned foods and fruit cocktail o Patient does still possess some frozen meals provided by SW but does not always have a "taste" for them o Patient denies intake of dairy or meat on a regular basis o Patient is out of Glucerna at this time o Patient does report access to farm fresh eggs as desired . Discussed importance of getting adequate protein in diet o Determined the patient is  open  to adding a nutritional supplement if her provider feels this would be beneficial . Collaboration with Minette Brine, FNP to update on patient current weight of 97 pounds and communicate patient willingness to add a supplement as needed . Collaboration with Octavio Manns to request feedback regarding Glucerna sample availability o Confirmed 8 cans of Glucerna have been placed in the front lobby for the patient to pick up o Successful outbound call placed to the patient to inform samples ready for pick up . Collaboration with [place] regarding patient goal progression . Confirmed patient knowledge of upcomming primary care appointment scheduled for June 10  CCM RN CM Interventions:  02/06/20 late entry - completed call with patient  . Determined patient is no longer receiving food stamps due to being notified her income was over the limit; discussed this is causing her to run out of food and or she is unable to purchase healthy foods to help her adhere to an ADA diet . Determined she is no longer able to afford Ensure or Glucerna and would like coupons or resources for this nutritional supplement . Determined she may need resources for housing and or contact information if needed to report her property manager is not doing all things necessary to get her into an apartment that does not contain mold spores . Collaborated with embedded BSW Daneen Schick  regarding patient's request for food and housing resources and coupons for Glucerna . Discussed plans with patient for ongoing care management follow up and provided patient with direct contact information for care management team  Patient Self Care Activities:  . Patient verbalizes understanding of plan to f/u with her property manager regarding moving her into a clean apartment that is mold free . Self administers medications as prescribed . Attends all scheduled provider appointments . Calls pharmacy for medication  refills . Performs ADL's independently . Performs IADL's independently . Calls provider office for new concerns or questions  Please see past updates related to this goal by clicking on the "Past Updates" button in the selected goal      .  "To keep my Asthma under good control" (pt-stated)      Current Barriers:  [name] Knowledge Deficits related to disease process and Self Health management of Asthma . 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 Evaluation of current treatment plan related to Asthma and patient's adherence to plan as established by provider. . Determined patient is concerned about potential health hazards that may exacerbate her Asthma due to direct exposure to mold and mildew from a water leak in her apartment . Determined the patient believes after having the water and carpet removed, it was not done so appropriately leaving behind mold/mildew . Educated patient on the potential implications of exposure to mold spores and how this can be harmful to her health, especially related to respiratory effects . Encouraged patient to follow up with her property manager again to inquire on her options to be moved to a new apartment . Sent in basket message to embedded BSW Daneen Schick requesting she contact patient to provide housing resources and or guidance concerning her housing issue with the need to be  relocated to a new apartment  . Discussed plans with patient for ongoing care management follow up and provided patient with direct contact information for care management team  Patient Self Care Activities:  . Self administers medications as prescribed . Attends all  scheduled provider appointments . Calls pharmacy for medication refills . Performs ADL's independently . Performs IADL's independently . Calls provider office for new concerns or questions  Please see past updates related to this goal by clicking on the "Past Updates" button in the selected goal      .  "To keep my diabetes under good control" (pt-stated)      Current Barriers:  [name] Knowledge Deficits related to disease process and Self Health management of Asthma . 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 Evaluation of current treatment plan related to Diabetes and patient's adherence to plan as established by provider. . Provided education to patient re: increase in A1C to 8.5 obtained on 01/15/20; educated on target A1C <7.0 and how to achieve this goal; education provided re: daily glycemic goal of 80-130 to help maintain/achieve lower A1C; educated patient on interventions to take with hypoglycemic events; patient reports 1 low of 67, and 1 high in 300's but average is within goal; she randomly checks FBS  . Reviewed medications with patient and discussed recent in increase with Metformin to 500 mg 2 tablets twice daily (1000 mg total), discussed additional recommendations to add Glipizide back to her regimen taking 1 tablet daily with breakfast; patient states she is adhering but is having GI SE related to this txt plan . Discussed plans with patient for ongoing care management follow up and provided patient with direct contact information for care management team . Advised patient, providing education and rationale, to check cbg daily before meals and record, calling the  CCM team and or PCP for findings outside established parameters   02/10/20 Inbound call from patient, voice message received  . Patient stated she is taking her medications exactly as prescribed and continues to have GI SE since increasing the Metformin and adding Glipizide; FBS this morning was 62 . Collaborated with embedded Pharm D Lottie Dawson via in basket message, requested to f/u with patient; advised patient's A1C has increased to 8.5 and patient reports having SE to increased dose of Metformin  CCM PharmD interventions: Call completed with patient on 03/05/20 . Reviewed medications with patient--focused on DM medications o Current antihyperglycemic regimen-metformin, glipizide - Patient using accu check nano glucometer - Needs multiclix lancets for multiclix device--will route message to CMA o Most recent A1c 8.5% on 1/21, however previous A1cs have been at goal o Discussed transitioning complete regimen to GLP1 (I.e Ozempic) vs SGLT1 (jardiance/farxiga) o Patient would like to wait until PCP appt coming up in April o She states she has not had Bg<70 in x1 month;  She states her Bgs have been >70 but <200 over the past month o She continues to have stressors in her life (ex husband, significant others, etc) that inhibit her optimal glycemic control. o Patient is on disability/medicare, therefore would recommend patient assistance program through [facility] or [facility] o Recommend lipid panel and repeat A1c  to determine therapy changes.  Patient Self Care Activities:  . Self administers medications as prescribed . Attends all scheduled provider appointments . Calls pharmacy for medication refills . Performs ADL's independently . Performs IADL's independently . Calls provider office for new concerns or questions  Please see past updates related to this goal by clicking on the "Past Updates" button in the selected goal      .  Patient Stated      To get back in shape. Gain  muscle tone.    .  Patient Stated      06/03/2020, patient wants to build and tone muscles       Fall Risk Fall Risk  06/03/2020 06/03/2020 02/16/2020 01/15/2020 12/10/2019  Falls in the past year? 1 1 0 0 0  Comment slid out of bed - - - -  Number falls in past yr: 0 1 - - -  Comment - - - - -  Injury with Fall? 0 - - - -  Comment - - - - -  Risk Factor Category  - - - - -  Risk for fall due to : History of fall(s);Medication side effect - - - -  Risk for fall due to: Comment - - - - -  Follow up Falls evaluation completed;Education provided;Falls prevention discussed - - - -   Is the patient's home free of loose throw rugs in walkways, pet beds, electrical cords, etc?   yes      Grab bars in the bathroom? yes      Handrails on the stairs?   n/a      Adequate lighting?   yes  Timed Get Up and Go performed: n/a  Depression Screen PHQ 2/9 Scores 06/03/2020 02/16/2020 01/15/2020 12/10/2019  PHQ - 2 Score 0 0 0 0  PHQ- 9 Score 0 - 0 -  Exception Documentation Other- indicate reason in comment box - - -  Not completed Just completed with CMA - - -     Cognitive Function     6CIT Screen 06/03/2020 04/22/2019  What Year? 0 points 0 points  What month? 0 points 0 points  What time? 0 points 0 points  Count back from 20 0 points 0 points  Months in reverse 0 points 0 points  Repeat phrase 0 points 0 points  Total Score 0 0    Immunization History  Administered Date(s) Administered  . Tdap 04/25/2016    Qualifies for Shingles Vaccine? yes  Screening Tests Health Maintenance  Topic Date Due  . OPHTHALMOLOGY EXAM  Never done  . URINE MICROALBUMIN  Never done  . HIV Screening  Never done  . MAMMOGRAM  09/10/2019  . COVID-19 Vaccine (1) 06/19/2020 (Originally 09/03/1978)  . PNEUMOCOCCAL POLYSACCHARIDE VACCINE AGE 44-64 HIGH RISK  06/03/2021 (Originally 09/03/1968)  . HEMOGLOBIN A1C  07/14/2020  . INFLUENZA VACCINE  07/25/2020  . FOOT EXAM  01/14/2021  . PAP SMEAR-Modifier   02/26/2022  . COLONOSCOPY  10/23/2024  . TETANUS/TDAP  04/25/2026  . Hepatitis C Screening  Completed    Cancer Screenings: Lung: Low Dose CT Chest recommended if Age 79-80 years, 30 pack-year currently smoking OR have quit w/in 15years. Patient does not qualify. Breast:  Up to date on Mammogram? No   Up to date of Bone Density/Dexa? n/a Colorectal: up to date  Additional Screenings: : Hepatitis C Screening: 10/05/2017     Plan:    Patient wants to build and tone muscle.   I have  personally reviewed and noted the following in the patient's chart:   . Medical and social history . Use of alcohol, tobacco or illicit drugs  . Current medications and supplements . Functional ability and status . Nutritional status . Physical activity . Advanced directives . List of other physicians . Hospitalizations, surgeries, and ER visits in previous 12 months . Vitals . Screenings to include cognitive, depression, and falls . Referrals and appointments  In addition, I have reviewed and discussed with patient certain preventive protocols, quality metrics, and best practice recommendations. A written personalized care plan for preventive services as well as general preventive health recommendations were provided to patient.     Kellie Simmering, LPN  0/30/1499

## 2020-06-04 ENCOUNTER — Ambulatory Visit (INDEPENDENT_AMBULATORY_CARE_PROVIDER_SITE_OTHER): Payer: Medicare Other

## 2020-06-04 ENCOUNTER — Telehealth: Payer: Medicare Other

## 2020-06-04 DIAGNOSIS — R634 Abnormal weight loss: Secondary | ICD-10-CM

## 2020-06-04 DIAGNOSIS — D510 Vitamin B12 deficiency anemia due to intrinsic factor deficiency: Secondary | ICD-10-CM

## 2020-06-04 DIAGNOSIS — J45909 Unspecified asthma, uncomplicated: Secondary | ICD-10-CM

## 2020-06-04 DIAGNOSIS — E119 Type 2 diabetes mellitus without complications: Secondary | ICD-10-CM

## 2020-06-04 DIAGNOSIS — G2582 Stiff-man syndrome: Secondary | ICD-10-CM

## 2020-06-04 LAB — CBC
Hematocrit: 33.9 % — ABNORMAL LOW (ref 34.0–46.6)
Hemoglobin: 11.5 g/dL (ref 11.1–15.9)
MCH: 28.7 pg (ref 26.6–33.0)
MCHC: 33.9 g/dL (ref 31.5–35.7)
MCV: 85 fL (ref 79–97)
Platelets: 229 10*3/uL (ref 150–450)
RBC: 4.01 x10E6/uL (ref 3.77–5.28)
RDW: 12.7 % (ref 11.7–15.4)
WBC: 5.2 10*3/uL (ref 3.4–10.8)

## 2020-06-04 LAB — CMP14+EGFR
ALT: 11 IU/L (ref 0–32)
AST: 12 IU/L (ref 0–40)
Albumin/Globulin Ratio: 1.4 (ref 1.2–2.2)
Albumin: 4 g/dL (ref 3.8–4.9)
Alkaline Phosphatase: 79 IU/L (ref 48–121)
BUN/Creatinine Ratio: 15 (ref 9–23)
BUN: 15 mg/dL (ref 6–24)
Bilirubin Total: <0.2 mg/dL (ref 0.0–1.2)
CO2: 22 mmol/L (ref 20–29)
Calcium: 9.7 mg/dL (ref 8.7–10.2)
Chloride: 104 mmol/L (ref 96–106)
Creatinine, Ser: 1 mg/dL (ref 0.57–1.00)
GFR calc Af Amer: 73 mL/min/{1.73_m2} (ref >59)
GFR calc non Af Amer: 64 mL/min/{1.73_m2} (ref >59)
Globulin, Total: 2.9 g/dL (ref 1.5–4.5)
Glucose: 199 mg/dL — ABNORMAL HIGH (ref 65–99)
Potassium: 4.8 mmol/L (ref 3.5–5.2)
Sodium: 141 mmol/L (ref 134–144)
Total Protein: 6.9 g/dL (ref 6.0–8.5)

## 2020-06-04 LAB — POCT URINALYSIS DIPSTICK
Bilirubin, UA: NEGATIVE
Blood, UA: NEGATIVE
Glucose, UA: NEGATIVE
Leukocytes, UA: NEGATIVE
Nitrite, UA: NEGATIVE
Protein, UA: NEGATIVE
Spec Grav, UA: >=1.03 — AB (ref 1.010–1.025)
Urobilinogen, UA: 0.2 E.U./dL
pH, UA: 5.5 (ref 5.0–8.0)

## 2020-06-04 LAB — POCT UA - MICROALBUMIN
Albumin/Creatinine Ratio, Urine, POC: 30
Creatinine, POC: 200 mg/dL
Microalbumin Ur, POC: 30 mg/L

## 2020-06-04 LAB — VITAMIN B12: Vitamin B-12: 448 pg/mL (ref 232–1245)

## 2020-06-04 LAB — LIPID PANEL
Chol/HDL Ratio: 2.3 ratio (ref 0.0–4.4)
Cholesterol, Total: 172 mg/dL (ref 100–199)
HDL: 75 mg/dL (ref >39)
LDL Chol Calc (NIH): 83 mg/dL (ref 0–99)
Triglycerides: 76 mg/dL (ref 0–149)
VLDL Cholesterol Cal: 14 mg/dL (ref 5–40)

## 2020-06-04 LAB — HEMOGLOBIN A1C
Est. average glucose Bld gHb Est-mCnc: 137 mg/dL
Hgb A1c MFr Bld: 6.4 % — ABNORMAL HIGH (ref 4.8–5.6)

## 2020-06-04 LAB — TSH: TSH: 1.15 u[IU]/mL (ref 0.450–4.500)

## 2020-06-04 MED ORDER — CETIRIZINE HCL 10 MG PO TABS: 10.0000 mg | ORAL_TABLET | Freq: Every day | ORAL | 2 refills | Status: DC

## 2020-06-07 ENCOUNTER — Ambulatory Visit: Payer: Self-pay

## 2020-06-07 DIAGNOSIS — E119 Type 2 diabetes mellitus without complications: Secondary | ICD-10-CM

## 2020-06-07 DIAGNOSIS — R634 Abnormal weight loss: Secondary | ICD-10-CM

## 2020-06-07 LAB — DRUG PROFILE, UR, 9 DRUGS (LABCORP)
Amphetamines, Urine: NEGATIVE ng/mL
Barbiturate Quant, Ur: NEGATIVE ng/mL
Benzodiazepine Quant, Ur: POSITIVE — AB
Cannabinoid Quant, Ur: NEGATIVE ng/mL
Cocaine (Metab.): POSITIVE — AB
Methadone Screen, Urine: NEGATIVE ng/mL
Opiate Quant, Ur: NEGATIVE ng/mL
PCP Quant, Ur: NEGATIVE ng/mL
Propoxyphene: NEGATIVE ng/mL

## 2020-06-07 NOTE — Chronic Care Management (AMB) (Signed)
°  Chronic Care Management   Outreach Note  06/07/2020 Name: Sherri [name] MRN: [id number] DOB: 04/05/1966  Referred by: Arnette Felts, FNP Reason for referral : Care Coordination   Collaboration with RN Care Manager who indicated patient is in need of Glucerna coupons to assist with cost savings. SW mailed patient Glucerna coupons to her home address.  Follow Up Plan: SW will follow up with the patient over the next two weeks as previously scheduled.  [name], BSW, CDP Social Worker, Certified Dementia Practitioner [facility] Care Management [phone]

## 2020-06-08 ENCOUNTER — Other Ambulatory Visit: Payer: Self-pay | Admitting: Nurse Practitioner

## 2020-06-08 ENCOUNTER — Telehealth: Payer: Self-pay

## 2020-06-08 DIAGNOSIS — Z8709 Personal history of other diseases of the respiratory system: Secondary | ICD-10-CM

## 2020-06-08 NOTE — Patient Instructions (Addendum)
Visit Information  Goals Addressed      Patient Stated   .  "I would like to gain some weight and get my muscles stronger" (pt-stated)        CARE PLAN ENTRY (see longitudinal plan of care for additional care plan information)  Current Barriers:  [name] Knowledge Deficits related to evaluation and treatment of abnormal weight loss  . 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 Over the next 90 days, patient will exhibit no significant weight loss as evidenced by maintaining her current weight of 102 lbs within +/5 lbs  CCM RN CM Interventions:  06/07/20 completed call with patient  . Inter-disciplinary care team collaboration (see longitudinal plan of care) . Evaluation of current treatment plan related to Abnormal weight loss and Nutritional support and patient's adherence to plan as established by provider . Provided education to patient re: potential risk for malnutrition and failure to thrive secondary to Pernicious Anemia; Encouraged patient to discuss this further with her Hematologist at next visit  . Assessed for changes in appetite and the ability to buy/prepare food/meals . Determined patient continues to drink Glucerna but is having financial difficulty paying for this supplement, otherwise she verbalizes trying to buy fresh and frozen foods  . Determined she currently has enough food on hand but states if she has company she is unable to offer them food or drink due to fear of running out . Collaborated with embedded [name] BSW regarding patient's ongoing reports of financial hardship which is making it difficult to purchase Glucerna . Discussed with BSW outreach to [employer] to request Glucerna samples and to explore best nutritional supplemental options  . Reply received from [facility] advising Glucerna 30 mg protein is available and samples will provided to the PCP office  . Discussed plans with patient for ongoing care management follow up and provided patient with direct contact information for care management team  Patient Self Care Activities:  . Self administers medications as prescribed . Attends all scheduled provider appointments . Calls pharmacy for medication refills . Performs ADL's independently . Performs IADL's independently . Calls provider office for new concerns or questions  Initial goal documentation       Patient verbalizes understanding of instructions provided today.   Telephone follow up appointment with care management team member scheduled for: 07/14/20   Delsa Sale, RN, BSN, CCM Care Management Coordinator [facility] Care Management/[facility]  Direct Phone: [phone]

## 2020-06-08 NOTE — Telephone Encounter (Signed)
Hey ladies! FYI I have been in contact with [employer] and they have a new Glucerna shake with 30g protein. I have requested a samplebox be sent to office for yall to offer to patients to try who are struggling with weightloss or lack of protein. I was originally contacting them to assess if there was a high priorty option for this patient. BUT please check with Janece when she returns to see if this would be a good option. Thank you! Package will hopefully arrive sometime next week.

## 2020-06-08 NOTE — Chronic Care Management (AMB) (Signed)
Chronic Care Management   Follow Up Note   06/07/2020 Name: Sherri [name] MRN: [id number] DOB: 03/31/1966  Referred by: Arnette Felts, FNP Reason for referral : Chronic Care Management (FU RN CM Call )   Sherri [name] is a 54 y.o. year old female who is a primary care patient of Arnette Felts, FNP. The CCM team was consulted for assistance with chronic disease management and care coordination needs.    Review of patient status, including review of consultants reports, relevant laboratory and other test results, and collaboration with appropriate care team members and the patient's provider was performed as part of comprehensive patient evaluation and provision of chronic care management services.    SDOH (Social Determinants of Health) assessments performed: Yes  See Care Plan activities for detailed interventions related to SDOH)   Placed outbound CCM RN CM follow up call for a care plan update.     Outpatient Encounter Medications as of 06/04/2020  Medication Sig Note  . Ascorbic Acid (VITAMIN C) 1000 MG tablet Take 1,000 mg by mouth daily.   . baclofen (LIORESAL) 20 MG tablet TAKE 1 AND 1/2 TABLETS BY  MOUTH IN THE MORNING AND IN THE EVENING AND 1 TABLET AT MIDDAY   . cetirizine (ZYRTEC) 10 MG tablet Take 1 tablet (10 mg total) by mouth daily.   . Cholecalciferol (VITAMIN D3) 5000 units CAPS Take by mouth.   . Cranberry-Vitamin C-Vitamin E (CRANBERRY PLUS VITAMIN C) 4200-20-3 MG-MG-UNIT CAPS Take by mouth.   . cyanocobalamin (,VITAMIN B-12,) 1000 MCG/ML injection Inject 1,000 mcg into the muscle every 3 (three) months.    . desloratadine (CLARINEX) 5 MG tablet TAKE 1 TABLET BY MOUTH  DAILY   . diazepam (VALIUM) 10 MG tablet Take 2 tablets (20 mg total) by mouth 3 (three) times daily.   . diphenhydrAMINE (BENADRYL) 25 MG tablet Take 25 mg by mouth at bedtime as needed for itching or allergies.  01/17/2017: Takes as needed  . EPINEPHRINE 0.3 mg/0.3 mL IJ SOAJ injection INJECT  INTRAMUSCULARLY  0.3ML AS NEEDED FOR  ANAPHYLAXIS . SEEK MEDICAL  ATTENTION AFTER USE.   . famotidine (PEPCID) 20 MG tablet TAKE 1 TABLET BY MOUTH TWICE A DAY   . FLUoxetine (PROZAC) 10 MG capsule TAKE 1 CAPSULE BY MOUTH  DAILY   . gabapentin (NEURONTIN) 300 MG capsule TAKE 1 CAPSULE BY MOUTH TWO TIMES DAILY   . glipiZIDE (GLUCOTROL) 5 MG tablet Take 5 mg by mouth daily before breakfast.   . Glucose Blood (ACCU-CHEK SIMPLICITY TEST STRP VI) by In Vitro route. Insert 1 by subcutaneous route 4 times every day check blood sugar before breakfast, lunch and dinner and bedtime   . hydrOXYzine (VISTARIL) 100 MG capsule TAKE 1 CAPSULE BY MOUTH 3  TIMES DAILY AS NEEDED FOR  ITCHING   . metFORMIN (GLUCOPHAGE) 500 MG tablet TAKE 2 TABLETS (1,000 MG TOTAL) BY MOUTH 2 (TWO) TIMES DAILY WITH A MEAL.   [name] Olopatadine HCl (PAZEO) 0.7 % SOLN Apply 1 drop to eye as needed.    . vitamin E (VITAMIN E) 400 UNIT capsule Take 400 Units by mouth daily.   . [DISCONTINUED] albuterol (PROAIR HFA) 108 (90 Base) MCG/ACT inhaler Inhale 2 puffs into the lungs every 6 (six) hours as needed for wheezing or shortness of breath.    No facility-administered encounter medications on file as of 06/04/2020.     Objective:  Lab Results  Component Value Date   HGBA1C 6.4 (H) 06/03/2020   HGBA1C 8.5 (  H) 01/15/2020   HGBA1C 5.9 (H) 04/23/2019   Lab Results  Component Value Date   MICROALBUR 30 06/04/2020   LDLCALC 83 06/03/2020   CREATININE 1.00 06/03/2020   BP Readings from Last 3 Encounters:  06/03/20 118/68  06/03/20 118/68  03/10/20 106/86    Goals Addressed      Patient Stated   .  "I would like to gain some weight and get my muscles stronger" (pt-stated)        CARE PLAN ENTRY (see longitudinal plan of care for additional care plan information)  Current Barriers:  [name] Knowledge Deficits related to evaluation and treatment of abnormal weight loss  . Chronic Disease Management support and education needs related to  DMII, Stiff-Person Syndrome, Asthma with bronchitis  Nurse Case Manager Clinical Goal(s):  [name] Over the next 90 days, patient will work with the CCM team and PCP to address needs related to disease education and support to improve Self Health management of nutrition and hydration  . Over the next 90 days, patient will exhibit no significant weight loss as evidenced by maintaining her current weight of 102 lbs within +/5 lbs  CCM RN CM Interventions:  06/07/20 completed call with patient  . Inter-disciplinary care team collaboration (see longitudinal plan of care) . Evaluation of current treatment plan related to Abnormal weight loss and Nutritional support and patient's adherence to plan as established by provider . Provided education to patient re: potential risk for malnutrition and failure to thrive secondary to Pernicious Anemia; Encouraged patient to discuss this further with her Hematologist at next visit  . Assessed for changes in appetite and the ability to buy/prepare food/meals . Determined patient continues to drink Glucerna but is having financial difficulty paying for this supplement, otherwise she verbalizes trying to buy fresh and frozen foods  . Determined she currently has enough food on hand but states if she has company she is unable to offer them food or drink due to fear of running out . Collaborated with embedded Daneen Schick BSW regarding patient's ongoing reports of financial hardship which is making it difficult to purchase Glucerna . Discussed with BSW outreach to [employer] to request Glucerna samples and to explore best nutritional supplemental options  . Reply received from [facility] advising Glucerna 30 mg protein is available and samples will provided to the PCP office  . Discussed plans with patient for ongoing care management follow up and provided patient with direct contact information for care management team  Patient Self Care Activities:  . Self administers  medications as prescribed . Attends all scheduled provider appointments . Calls pharmacy for medication refills . Performs ADL's independently . Performs IADL's independently . Calls provider office for new concerns or questions  Initial goal documentation       Plan:   Telephone follow up appointment with care management team member scheduled for: 07/14/20  Barb Merino, RN, BSN, CCM Care Management Coordinator [facility]  Direct Phone: [phone]

## 2020-06-10 ENCOUNTER — Inpatient Hospital Stay: Payer: Medicare Other

## 2020-06-10 ENCOUNTER — Inpatient Hospital Stay: Payer: Medicare Other | Admitting: Hematology & Oncology

## 2020-06-14 ENCOUNTER — Telehealth: Payer: Medicare Other

## 2020-06-15 ENCOUNTER — Other Ambulatory Visit: Payer: Self-pay | Admitting: Neurology

## 2020-06-17 ENCOUNTER — Ambulatory Visit: Payer: Self-pay

## 2020-06-17 ENCOUNTER — Other Ambulatory Visit: Payer: Self-pay | Admitting: Nurse Practitioner

## 2020-06-17 ENCOUNTER — Telehealth: Payer: Self-pay

## 2020-06-17 ENCOUNTER — Other Ambulatory Visit: Payer: Self-pay

## 2020-06-17 DIAGNOSIS — E119 Type 2 diabetes mellitus without complications: Secondary | ICD-10-CM

## 2020-06-17 DIAGNOSIS — J302 Other seasonal allergic rhinitis: Secondary | ICD-10-CM

## 2020-06-17 DIAGNOSIS — R634 Abnormal weight loss: Secondary | ICD-10-CM

## 2020-06-17 MED ORDER — CETIRIZINE HCL 10 MG PO TABS: 10.0000 mg | ORAL_TABLET | Freq: Every day | ORAL | 2 refills | Status: DC

## 2020-06-17 NOTE — Chronic Care Management (AMB) (Signed)
  Chronic Care Management   Outreach Note  06/17/2020 Name: Sherri [name] MRN: [id number] DOB: [birth year]  Referred by: Arnette Felts, FNP Reason for referral : Care Coordination   SW placed an unsuccessful outbound call to the patient to assess for ongoing care coordination needs and patient goal progression. SW left a HIPAA compliant voice message requesting a return call.  Follow Up Plan: The care management team will reach out to the patient again over the next 10 days.   [name], BSW, CDP Social Worker, Certified Dementia Practitioner [facility] Care Management [phone]

## 2020-06-20 ENCOUNTER — Other Ambulatory Visit: Payer: Self-pay | Admitting: Nurse Practitioner

## 2020-06-20 ENCOUNTER — Other Ambulatory Visit: Payer: Self-pay | Admitting: Allergy and Immunology

## 2020-06-20 DIAGNOSIS — R21 Rash and other nonspecific skin eruption: Secondary | ICD-10-CM

## 2020-06-22 ENCOUNTER — Encounter: Payer: Self-pay | Admitting: Neurology

## 2020-06-22 ENCOUNTER — Ambulatory Visit (INDEPENDENT_AMBULATORY_CARE_PROVIDER_SITE_OTHER): Payer: Medicare Other | Admitting: Neurology

## 2020-06-22 ENCOUNTER — Other Ambulatory Visit: Payer: Self-pay

## 2020-06-22 ENCOUNTER — Telehealth: Payer: Self-pay

## 2020-06-22 VITALS — BP 130/62 | HR 86 | Ht 63.0 in | Wt 101.0 lb

## 2020-06-22 DIAGNOSIS — G2582 Stiff-man syndrome: Secondary | ICD-10-CM | POA: Diagnosis not present

## 2020-06-22 MED ORDER — FAMOTIDINE 20 MG PO TABS: 20.0000 mg | ORAL_TABLET | Freq: Two times a day (BID) | ORAL | 0 refills | Status: DC

## 2020-06-22 MED ORDER — GABAPENTIN 300 MG PO CAPS: ORAL_CAPSULE | ORAL | 3 refills | Status: DC

## 2020-06-22 NOTE — Telephone Encounter (Signed)
[facility] called stating pt is taking zyrtec and desloratadine and they would like to know if that is correct. [phone] ref #[id number] press option 1 and then option 2.  I returned their call advised them that she is taking both meds per Arnette Felts DNP,FNP-Bc. Marijo Sanes

## 2020-06-22 NOTE — Progress Notes (Signed)
PATIENT: Sherri [name] DOB: 09-26-1966  REASON FOR VISIT: follow up HISTORY FROM: patient  HISTORY OF PRESENT ILLNESS: Today 06/22/20  Sherri [name] is a 54 year old female with history of stiff person syndrome.  She remains on baclofen and diazepam.  She is also prescribed gabapentin (for back pain) and  Prozac from this office.  She has previously called and reported falls, the possibility of reducing the dose of diazepam was considered, no recent falls.  She continues to remain overall stable as long as she takes her medications schedule.  In the past 6 months, she has lost about 15 pounds.  Her diabetes is doing overall well recent A1c was 6.4.  She is on Metformin and glipizide.  She is thinking of getting a job.  She lives alone, drives a car.  She uses a cane.  Mostly her left leg gives her problems being stiff.  Presents today for follow-up unaccompanied.  HISTORY  12/17/2019 SS: Sherri [name] is a 54 year old female with history of stiff person syndrome. She remains on baclofen and diazepam.  She is also prescribed gabapentin and Prozac from this office.  She tested positive for COVID-19 on 12/03/2019, this is her 14th day of quarantine.  She indicates her stiff person syndrome has been under good control with medications.  She says she is tolerating the medication without side effect.  She has not had any recent falls.  She called and reported falls in January and February, the thought of possibly reducing the dose of diazepam was considered.  She takes a cane with her when she goes out, but she does not have to use it.  She drives a car.  She is on disability, but she is considering getting a job.  She indicates her overall health has been well and that the medications are working great.  REVIEW OF SYSTEMS: Out of a complete 14 system review of symptoms, the patient complains only of the following symptoms, and all other reviewed systems are negative.  Muscle  stiffness  ALLERGIES: Allergies  Allergen Reactions  . Ibuprofen Other (See Comments)    Does not take due to hx of renal insufficiency "I have kidney disease"   . Lemon Flavor Swelling    Severe Lip Swelling  FRUIT per pt.    . Amoxicillin Diarrhea and Other (See Comments)  . Tylenol [Acetaminophen] Hives    Cannot take large quantities    HOME MEDICATIONS: Outpatient Medications Prior to Visit  Medication Sig Dispense Refill  . albuterol (PROAIR HFA) 108 (90 Base) MCG/ACT inhaler Inhale into the lungs every 6 (six) hours as needed for wheezing or shortness of breath.    . Ascorbic Acid (VITAMIN C) 1000 MG tablet Take 1,000 mg by mouth daily.    . baclofen (LIORESAL) 20 MG tablet TAKE 1 AND 1/2 TABLETS BY  MOUTH IN THE MORNING AND IN THE EVENING AND 1 TABLET AT MIDDAY 360 tablet 3  . cetirizine (ZYRTEC) 10 MG tablet Take 1 tablet (10 mg total) by mouth daily. 90 tablet 2  . Cholecalciferol (VITAMIN D3) 5000 units CAPS Take by mouth.    . Cranberry-Vitamin C-Vitamin E (CRANBERRY PLUS VITAMIN C) 4200-20-3 MG-MG-UNIT CAPS Take by mouth.    . cyanocobalamin (,VITAMIN B-12,) 1000 MCG/ML injection Inject 1,000 mcg into the muscle every 3 (three) months.     . desloratadine (CLARINEX) 5 MG tablet TAKE 1 TABLET BY MOUTH  DAILY 90 tablet 0  . diazepam (VALIUM) 10 MG tablet Take 2 tablets (  20 mg total) by mouth 3 (three) times daily. 180 tablet 3  . diphenhydrAMINE (BENADRYL) 25 MG tablet Take 25 mg by mouth at bedtime as needed for itching or allergies.     [name] EPINEPHRINE 0.3 mg/0.3 mL IJ SOAJ injection INJECT INTRAMUSCULARLY  0.3ML AS NEEDED FOR  ANAPHYLAXIS . SEEK MEDICAL  ATTENTION AFTER USE. 2 each 0  . famotidine (PEPCID) 20 MG tablet Take 1 tablet (20 mg total) by mouth 2 (two) times daily. 180 tablet 0  . FLUoxetine (PROZAC) 10 MG capsule TAKE 1 CAPSULE BY MOUTH  DAILY 90 capsule 3  . glipiZIDE (GLUCOTROL) 5 MG tablet Take 5 mg by mouth daily before breakfast.    . Glucose Blood  (ACCU-CHEK SIMPLICITY TEST STRP VI) by In Vitro route. Insert 1 by subcutaneous route 4 times every day check blood sugar before breakfast, lunch and dinner and bedtime    . hydrOXYzine (VISTARIL) 100 MG capsule TAKE 1 CAPSULE (100 MG TOTAL) BY MOUTH 3 (THREE) TIMES DAILY AS NEEDED FOR ITCHING. 90 capsule 1  . metFORMIN (GLUCOPHAGE) 500 MG tablet TAKE 2 TABLETS (1,000 MG TOTAL) BY MOUTH 2 (TWO) TIMES DAILY WITH A MEAL. 360 tablet 1  . Olopatadine HCl (PAZEO) 0.7 % SOLN Apply 1 drop to eye as needed.     . vitamin E (VITAMIN E) 400 UNIT capsule Take 400 Units by mouth daily.    [name] gabapentin (NEURONTIN) 300 MG capsule TAKE 1 CAPSULE BY MOUTH TWO TIMES DAILY 180 capsule 3  . albuterol (VENTOLIN HFA) 108 (90 Base) MCG/ACT inhaler TAKE 2 PUFFS BY MOUTH EVERY 6 HOURS AS NEEDED FOR WHEEZE OR SHORTNESS OF BREATH 8 g 1   No facility-administered medications prior to visit.    PAST MEDICAL HISTORY: Past Medical History:  Diagnosis Date  . Angioedema   . Anxiety   . Arthritis   . Asthma   . Benzodiazepine withdrawal (HCC) 08/30/2012  . CAP (community acquired pneumonia) 08/29/2012  . Chronic back pain   . Chronic kidney disease   . Closed nondisplaced fracture of proximal phalanx of right little finger 10/18/2018  . Cocaine abuse (HCC)   . Depression   . Diarrhea   . DVT (deep venous thrombosis) (HCC)   . Environmental allergies   . Fall   . GERD (gastroesophageal reflux disease)   . Heart murmur    has been told once that she has a heart murmur, but has never had any problems  . Hiatal hernia 03/21/2013  . Lumbar herniated disc   . Peripheral vascular disease (HCC)   . Pernicious anemia 10/30/2011  . Postconcussion syndrome 12/30/2014  . Rhabdomyolysis 08/27/2012  . Seasonal allergies   . Stiff person syndrome   . Urticaria     PAST SURGICAL HISTORY: Past Surgical History:  Procedure Laterality Date  . BUNIONECTOMY Left   . colonoscopy  07/09/15  . inguinal hernia 1983  1983  . OPEN  REDUCTION INTERNAL FIXATION (ORIF) DISTAL RADIAL FRACTURE Right 08/16/2016   Procedure: OPEN REDUCTION INTERNAL FIXATION (ORIF) DISTAL RADIAL FRACTURE;  Surgeon: Eldred Manges, MD;  Location: [facility] OR;  Service: Orthopedics;  Laterality: Right;    FAMILY HISTORY: Family History  Problem Relation Age of Onset  . Cancer Mother   . Other Mother   . COPD Father   . Asthma Brother   . Cancer Brother        colon  . Heart attack Brother   . Seizures Brother   . Allergic rhinitis Neg  Hx   . Angioedema Neg Hx   . Eczema Neg Hx   . Immunodeficiency Neg Hx   . Urticaria Neg Hx     SOCIAL HISTORY: Social History   Socioeconomic History  . Marital status: Divorced    Spouse name: Not on file  . Number of children: 1  . Years of education: college  . Highest education level: Not on file  Occupational History  . Occupation: disabled  Tobacco Use  . Smoking status: Never Smoker  . Smokeless tobacco: Never Used  . Tobacco comment: Never Used Tobacco  Vaping Use  . Vaping Use: Never used  Substance and Sexual Activity  . Alcohol use: Not Currently    Alcohol/week: 0.0 standard drinks    Comment: Occasional  . Drug use: Not Currently  . Sexual activity: Not Currently    Birth control/protection: Condom  Other Topics Concern  . Not on file  Social History Narrative   Patient is right and left handed.   Patient drinks some caffeine occasionally.   Social Determinants of Health   Financial Resource Strain: Low Risk   . Difficulty of Paying Living Expenses: Not hard at all  Food Insecurity: No Food Insecurity  . Worried About Programme researcher, broadcasting/film/video in the Last Year: Never true  . Ran Out of Food in the Last Year: Never true  Transportation Needs: No Transportation Needs  . Lack of Transportation (Medical): No  . Lack of Transportation (Non-Medical): No  Physical Activity: Inactive  . Days of Exercise per Week: 0 days  . Minutes of Exercise per Session: 0 min  Stress: No Stress  Concern Present  . Feeling of Stress : Not at all  Social Connections:   . Frequency of Communication with Friends and Family:   . Frequency of Social Gatherings with Friends and Family:   . Attends Religious Services:   . Active Member of Clubs or Organizations:   . Attends Banker Meetings:   [name] Marital Status:   Intimate Partner Violence:   . Fear of Current or Ex-Partner:   . Emotionally Abused:   [name] Physically Abused:   . Sexually Abused:    PHYSICAL EXAM  Vitals:   06/22/20 1115  BP: 130/62  Pulse: 86  Weight: 101 lb (45.8 kg)  Height: 5\' 3"  (1.6 m)   Body mass index is 17.89 kg/m.  Generalized: Well developed, in no acute distress   Neurological examination  Mentation: Alert oriented to time, place, history taking. Follows all commands speech and language fluent Cranial nerve II-XII: Pupils were equal round reactive to light. Extraocular movements were full, visual field were full on confrontational test. Facial sensation and strength were normal. Head turning and shoulder shrug  were normal and symmetric. Motor: The motor testing reveals 5 over 5 strength of all 4 extremities.  No significant muscle stiffness or spasticity was noted. Sensory: Sensory testing is intact to soft touch on all 4 extremities. No evidence of extinction is noted.  Coordination: Cerebellar testing reveals good finger-nose-finger and heel-to-shin bilaterally.  Gait and station: Tends to drag the left leg with walking, loosens up as she gets going, does use single-point cane Reflexes: Deep tendon reflexes are symmetric and normal bilaterally.   DIAGNOSTIC DATA (LABS, IMAGING, TESTING) - I reviewed patient records, labs, notes, testing and imaging myself where available.  Lab Results  Component Value Date   WBC 5.2 06/03/2020   HGB 11.5 06/03/2020   HCT 33.9 (L) 06/03/2020  MCV 85 06/03/2020   PLT 229 06/03/2020      Component Value Date/Time   NA 141 06/03/2020 1700   NA  136 11/23/2017 1147   NA 144 03/22/2017 0943   K 4.8 06/03/2020 1700   K 3.9 11/23/2017 1147   K 4.2 03/22/2017 0943   CL 104 06/03/2020 1700   CL 94 (L) 11/23/2017 1147   CO2 22 06/03/2020 1700   CO2 30 11/23/2017 1147   CO2 29 03/22/2017 0943   GLUCOSE 199 (H) 06/03/2020 1700   GLUCOSE 84 03/10/2020 1014   GLUCOSE 531 (H) 11/23/2017 1147   BUN 15 06/03/2020 1700   BUN 10 11/23/2017 1147   BUN 11.1 03/22/2017 0943   CREATININE 1.00 06/03/2020 1700   CREATININE 1.26 (H) 03/10/2020 1014   CREATININE 1.0 11/23/2017 1147   CREATININE 1.0 03/22/2017 0943   CALCIUM 9.7 06/03/2020 1700   CALCIUM 10.1 11/23/2017 1147   CALCIUM 9.9 03/22/2017 0943   PROT 6.9 06/03/2020 1700   PROT 8.3 (H) 11/23/2017 1147   PROT 7.1 03/22/2017 0943   ALBUMIN 4.0 06/03/2020 1700   ALBUMIN 3.8 03/22/2017 0943   AST 12 06/03/2020 1700   AST 12 (L) 03/10/2020 1014   AST 18 03/22/2017 0943   ALT 11 06/03/2020 1700   ALT 12 03/10/2020 1014   ALT 46 11/23/2017 1147   ALT 23 03/22/2017 0943   ALKPHOS 79 06/03/2020 1700   ALKPHOS 174 (H) 11/23/2017 1147   ALKPHOS 114 03/22/2017 0943   BILITOT <0.2 06/03/2020 1700   BILITOT 0.3 03/10/2020 1014   BILITOT 0.24 03/22/2017 0943   GFRNONAA 64 06/03/2020 1700   GFRNONAA 48 (L) 03/10/2020 1014   GFRAA 73 06/03/2020 1700   GFRAA 56 (L) 03/10/2020 1014   Lab Results  Component Value Date   CHOL 172 06/03/2020   HDL 75 06/03/2020   LDLCALC 83 06/03/2020   TRIG 76 06/03/2020   CHOLHDL 2.3 06/03/2020   Lab Results  Component Value Date   HGBA1C 6.4 (H) 06/03/2020   Lab Results  Component Value Date   VITAMINB12 448 06/03/2020   Lab Results  Component Value Date   TSH 1.150 06/03/2020      ASSESSMENT AND PLAN 54 y.o. year old female  has a past medical history of Angioedema, Anxiety, Arthritis, Asthma, Benzodiazepine withdrawal (HCC) (08/30/2012), CAP (community acquired pneumonia) (08/29/2012), Chronic back pain, Chronic kidney disease, Closed  nondisplaced fracture of proximal phalanx of right little finger (10/18/2018), Cocaine abuse (HCC), Depression, Diarrhea, DVT (deep venous thrombosis) (HCC), Environmental allergies, Fall, GERD (gastroesophageal reflux disease), Heart murmur, Hiatal hernia (03/21/2013), Lumbar herniated disc, Peripheral vascular disease (HCC), Pernicious anemia (10/30/2011), Postconcussion syndrome (12/30/2014), Rhabdomyolysis (08/27/2012), Seasonal allergies, Stiff person syndrome, and Urticaria. here with:  1.  Stiff person syndrome 2.  Anxiety disorder  She remains overall stable on combination of baclofen and diazepam.  She is also taking Prozac and gabapentin.  We will continue these medications.  I have refilled her gabapentin.  She is not quite due for diazepam.  She will follow-up in 6 months or sooner if needed.  I spent 20 minutes of face-to-face and non-face-to-face time with patient.  This included previsit chart review, lab review, study review, order entry, electronic health record documentation, patient education.  Margie Ege, AGNP-C, DNP 06/22/2020, 11:46 AM [facility] [address], [address] [place], [place] [address] [phone]

## 2020-06-22 NOTE — Patient Instructions (Signed)
Continue current medications  See you back in 6 months or sooner if needed

## 2020-06-23 ENCOUNTER — Ambulatory Visit: Payer: Self-pay

## 2020-06-23 DIAGNOSIS — R634 Abnormal weight loss: Secondary | ICD-10-CM

## 2020-06-23 DIAGNOSIS — E119 Type 2 diabetes mellitus without complications: Secondary | ICD-10-CM

## 2020-06-23 NOTE — Chronic Care Management (AMB) (Signed)
  Chronic Care Management   Outreach Note  06/23/2020 Name: Sherri [name] MRN: [id number] DOB: November 02, 1966  Referred by: Arnette Felts, FNP Reason for referral : Care Coordination   SW placed an unsuccessful outbound call to the patient in response to a voice message received. SW left a HIPAA compliant voice message requesting a return call.  Follow Up Plan: SW will follow up with the patient over the next 14 days as previously scheduled  [name], BSW, CDP Social Worker, Certified Dementia Practitioner [facility] Care Management [phone]

## 2020-06-24 NOTE — Chronic Care Management (AMB) (Deleted)
Chronic Care Management Pharmacy  Name: Sherri [name]  MRN: [id number] DOB: 27-Apr-1966  Chief Complaint/ HPI  Sherri [name],  54 y.o. , female presents for their Initial CCM visit with the clinical pharmacist {[facility] Pharm visit NLGX:[id number]}.  PCP : Minette Brine, FNP  Their chronic conditions include: asthma, chronic rhinitis, dysphagia, type 2 DM, Degenerative Disc Disease, Pernicious anemia, Chronic back pain, hiatal hernia, stiff person syndrome.   Office Visits:*** 06/03/2020 - Ordered a1c, lipid panel, tsh, vitamin b12, cmp14+egfr, CBC, vitamin b12.  06/03/2020 - AWV - referred for mammogram, eye visit, dental visit.  02/16/2020 - changing to metformin xr due to nauea. Glumetza 1000 mg bid. Change glipizide to midday to see if improved.  01/27/2020 - Lab result call - You are negative for covid.  Your kidney functions has increased.  Your HgbA1c has jumped up significantly to 8.5, you need to increase your metformin to 1000 mg twice a day, do you still have any glipizide at home? Stop eating junk food particularly at night.  01/15/2020 - increased blood sugars. Stress at home and late night eating. Recommend appt to schedule for ophthalmology. CMP14+EGFR, A1C ordered. Metformin 500 mg 2 tablets with breakfast daily and 1 tablet of the glipizide daily. Refill Proair. Mammogram ordered. Recheck Covid test.  Consult Visits:*** 06/22/2020 - Neurology - continue on baclofen and diazepam for stiff person syndrome. Gabapentin for back pain.  03/10/2020 - Oncology - provided a bag of food from food pantry in office. B12 injection administered. Concern for weight loss. F/u in 3 months.  CCM Visits:  06/07/2020 - advised patient to discuss weight loss/malnutrition with oncologist. Coordinated samples of [place].  05/17/2020 - mainly eating canned food/fruit cocktail. Cough some better. Out of glucerna - placed 8 cans at front lobby for pickup.  05/13/2020 - SW - patient to  see PCP 06/10. Cancelled 04/29 appt. Pt weight around 97 lbs. Pt complaint of cough/cold symptoms. Recommended COVID test. Patient fell and hit her side.  04/13/2020 - patient's current weight is 98.4. Discussed diet.  03/18/2020 - collaborate with FNP and RN about weight loss.  03/11/2020 - SW - extended [employer] by 4 weeks due to food insecurity.  03/10/2020 - SW - glucerna samples picked up.  03/09/2020 - SW - housing coalition requests MD letter about mold removal. Encouraged patient to pick up glucerna samples on 03/17 when sister takes her to appt.  03/08/2020 - SW - patient is being evicted due to lack of payment. SW referred to housing coalition for resources.  03/05/2020 - Pharmacist - need multiclix lancest for device - routing message to [place]. Discussed transitioning regimen to GLP1 vs. Jardiance/Farxiga. Patient wants to wait for PCP visit. Recommend PAP if medication regimen change. 03/05/2020 - SW - SW contacted [employer] regarding home repair needs.  03/02/2020 - SW - referral to [place] regarding Secretary/administrator. Patient unable to pick up samples of glucerna due to license suspended.  02/24/2020 - SW - samples of glucerna is available. Patient received meal delivery. Patient has heard back from [place] Mr. Talmadge Coventry about housing.  02/18/2020 - SW - Confirmation meals beginning 02/25 from Mom[employer]. [employer] will provide Glucerna samples.  02/17/2020 - SW - Determined food insecurity du to inability to afford nutritional supplements and healthy food. Referrals placed for food options. Patient has concerns for mold due to hot water leak. In contact with [facility] about housing needs.  02/09/2020 - SW - patient with slurred  speech and lethargic on SW call. EMS dispatched while SW on phone with patient. BS 368. Patient refused medical treatment. Paramedics advised patient to increase water and take DM meds. Minette Brine wants patient to schedule  a virtual visit or go to the ER.  02/06/2020 RN - Patient no longer receiving food stamps due to being notified her income was over the limit. Can no longer afford Ensure or Glucerna. Reviewed DM regimen recent increase of Metformin 1000 mg bid and glipizide 5 mg daily.  01/02/2020 - RN - asthma/diabetes/stiff person care plan developed. Discussed regimen and compliance.   Medications: Outpatient Encounter Medications as of 06/25/2020  Medication Sig Note  . albuterol (PROAIR HFA) 108 (90 Base) MCG/ACT inhaler Inhale into the lungs every 6 (six) hours as needed for wheezing or shortness of breath.   . Ascorbic Acid (VITAMIN C) 1000 MG tablet Take 1,000 mg by mouth daily.   . baclofen (LIORESAL) 20 MG tablet TAKE 1 AND 1/2 TABLETS BY  MOUTH IN THE MORNING AND IN THE EVENING AND 1 TABLET AT MIDDAY   . cetirizine (ZYRTEC) 10 MG tablet Take 1 tablet (10 mg total) by mouth daily.   . Cholecalciferol (VITAMIN D3) 5000 units CAPS Take by mouth.   . Cranberry-Vitamin C-Vitamin E (CRANBERRY PLUS VITAMIN C) 4200-20-3 MG-MG-UNIT CAPS Take by mouth.   . cyanocobalamin (,VITAMIN B-12,) 1000 MCG/ML injection Inject 1,000 mcg into the muscle every 3 (three) months.    . desloratadine (CLARINEX) 5 MG tablet TAKE 1 TABLET BY MOUTH  DAILY   . diazepam (VALIUM) 10 MG tablet Take 2 tablets (20 mg total) by mouth 3 (three) times daily.   . diphenhydrAMINE (BENADRYL) 25 MG tablet Take 25 mg by mouth at bedtime as needed for itching or allergies.  01/17/2017: Takes as needed  . EPINEPHRINE 0.3 mg/0.3 mL IJ SOAJ injection INJECT INTRAMUSCULARLY  0.3ML AS NEEDED FOR  ANAPHYLAXIS . SEEK MEDICAL  ATTENTION AFTER USE.   . famotidine (PEPCID) 20 MG tablet Take 1 tablet (20 mg total) by mouth 2 (two) times daily.   [name] FLUoxetine (PROZAC) 10 MG capsule TAKE 1 CAPSULE BY MOUTH  DAILY   . gabapentin (NEURONTIN) 300 MG capsule TAKE 1 CAPSULE BY MOUTH TWO TIMES DAILY   . glipiZIDE (GLUCOTROL) 5 MG tablet Take 5 mg by mouth daily  before breakfast.   . Glucose Blood (ACCU-CHEK SIMPLICITY TEST STRP VI) by In Vitro route. Insert 1 by subcutaneous route 4 times every day check blood sugar before breakfast, lunch and dinner and bedtime   . hydrOXYzine (VISTARIL) 100 MG capsule TAKE 1 CAPSULE (100 MG TOTAL) BY MOUTH 3 (THREE) TIMES DAILY AS NEEDED FOR ITCHING.   . metFORMIN (GLUCOPHAGE) 500 MG tablet TAKE 2 TABLETS (1,000 MG TOTAL) BY MOUTH 2 (TWO) TIMES DAILY WITH A MEAL.   [name] Olopatadine HCl (PAZEO) 0.7 % SOLN Apply 1 drop to eye as needed.    . vitamin E (VITAMIN E) 400 UNIT capsule Take 400 Units by mouth daily.    No facility-administered encounter medications on file as of 06/25/2020.   Allergies  Allergen Reactions  . Ibuprofen Other (See Comments)    Does not take due to hx of renal insufficiency "I have kidney disease"   . Lemon Flavor Swelling    Severe Lip Swelling  FRUIT per pt.    . Amoxicillin Diarrhea and Other (See Comments)  . Tylenol [Acetaminophen] Hives    Cannot take large quantities   SDOH Screenings  Alcohol Screen:   . Last Alcohol Screening Score (AUDIT):   Depression (PHQ2-9): Low Risk   . PHQ-2 Score: 0  Financial Resource Strain: Low Risk   . Difficulty of Paying Living Expenses: Not hard at all  Food Insecurity: No Food Insecurity  . Worried About Charity fundraiser in the Last Year: Never true  . Ran Out of Food in the Last Year: Never true  Housing: Medium Risk  . Last Housing Risk Score: 1  Physical Activity: Inactive  . Days of Exercise per Week: 0 days  . Minutes of Exercise per Session: 0 min  Social Connections:   . Frequency of Communication with Friends and Family:   . Frequency of Social Gatherings with Friends and Family:   . Attends Religious Services:   . Active Member of Clubs or Organizations:   . Attends Archivist Meetings:   [name] Marital Status:   Stress: No Stress Concern Present  . Feeling of Stress : Not at all  Tobacco Use: Low Risk   . Smoking  Tobacco Use: Never Smoker  . Smokeless Tobacco Use: Never Used  Transportation Needs: No Transportation Needs  . Lack of Transportation (Medical): No  . Lack of Transportation (Non-Medical): No      Current Diagnosis/Assessment:  Goals Addressed   None     Diabetes   Recent Relevant Labs: Lab Results  Component Value Date/Time   HGBA1C 6.4 (H) 06/03/2020 05:00 PM   HGBA1C 8.5 (H) 01/15/2020 12:12 PM   EGFR 73 (L) 03/22/2017 09:43 AM   EGFR 61 (L) 08/24/2016 10:04 AM   MICROALBUR 30 06/04/2020 12:29 PM    Kidney Function Lab Results  Component Value Date/Time   CREATININE 1.00 06/03/2020 05:00 PM   CREATININE 1.26 (H) 03/10/2020 10:14 AM   CREATININE 1.13 (H) 01/15/2020 12:12 PM   CREATININE 1.01 (H) 07/04/2019 01:09 PM   CREATININE 1.0 11/23/2017 11:47 AM   CREATININE 1.0 07/23/2017 11:30 AM   CREATININE 1.0 03/22/2017 09:43 AM   CREATININE 1.2 (H) 08/24/2016 10:04 AM   GFRNONAA 64 06/03/2020 05:00 PM   GFRNONAA 48 (L) 03/10/2020 10:14 AM   GFRAA 73 06/03/2020 05:00 PM   GFRAA 56 (L) 03/10/2020 10:14 AM   K 4.8 06/03/2020 05:00 PM   K 4.9 03/10/2020 10:14 AM   K 3.9 11/23/2017 11:47 AM   K 3.8 07/23/2017 11:30 AM   K 4.2 03/22/2017 09:43 AM   K 3.7 08/24/2016 10:04 AM    Checking BG: {CHL HP Blood Glucose Monitoring Frequency:[phone]}  Recent FBG Readings: Recent pre-meal BG readings: *** Recent 2hr PP BG readings:  *** Recent HS BG readings: *** Patient has failed these meds in past: *** Patient is currently {CHL Controlled/Uncontrolled:[phone]} on the following medications: metformin 1000 mg bid with a meal, glipizide 5 mg daily before breakfast, accu-chek simplicity qid  Last diabetic Foot exam: No results found for: HMDIABEYEEXA  Last diabetic Eye exam: No results found for: HMDIABFOOTEX   We discussed: {[facility] Upstream Pharmacy discussion:[id number]}  Plan  Continue {[facility] Upstream Pharmacy Plans:[phone]}  COPD / Asthma / Tobacco    Last spirometry score: ***  Gold Grade: {[facility] Up[facility] Pharm COPD Gold [id number]} Current COPD Classification:  {[facility] Pharm COPD [phone]}  Eosinophil count:   Lab Results  Component Value Date/Time   EOSPCT 0 03/10/2020 10:14 AM   EOSPCT 0.0 11/23/2017 11:47 AM   EOSPCT 0.0 05/04/2008 09:18 AM  %  Eos (Absolute):  Lab Results  Component Value Date/Time   EOSABS 0.0 03/10/2020 10:14 AM   EOSABS 0.0 11/23/2017 11:47 AM    Tobacco Status:  Social History   Tobacco Use  Smoking Status Never Smoker  Smokeless Tobacco Never Used  Tobacco Comment   Never Used Tobacco    Patient has failed these meds in past: hydromet, benzonotate capsules, flonase, advair, singulair Patient is currently {CHL Controlled/Uncontrolled:[phone]} on the following medications: proair inhaler q6h prn sob/wheezing, cetirizine 10 mg daily, desloratadine 5 mg daily, pazeo 0.7% 1 drop to eye prn Using maintenance inhaler regularly? {yes/no:20286} Frequency of rescue inhaler use:  {[facility] Upstream Pharm Inhaler VZCH:[id number]}  We discussed:  {[facility]L HP Upstream Pharmacy discussion:[phone]}  Plan  Continue {CHL HP Upstream Pharmacy Plans:[id number]}  ***Hiatal Hernia   Patient has failed these meds in past: omeprazole, pantoprazole, ranitidine Patient is currently {CHL Controlled/Uncontrolled:[phone]} on the following medications:  . Famotidine 20 mg bid  We discussed:  ***  Plan  Continue {[facility] Upstream Pharmacy INOMV:[id number]}   ***Anxiety/Depression   Patient has failed these meds in past: alprazolam Patient is currently {CHL Controlled/Uncontrolled:[phone]} on the following medications:  .  [name] Fluoxetine 10 mg daily  We discussed:  ***  Plan  Continue {[facility]L HP Upstream Pharmacy Plans:[id number]}   Pernicious Anemia   CBC Latest Ref Rng & Units 06/03/2020 03/10/2020 07/04/2019  WBC 3.4 - 10.8 x10E3/uL  5.2 7.4 5.0  Hemoglobin 11.1 - 15.9 g/dL 11.5 11.8(L) 11.4(L)  Hematocrit 34.0 - 46.6 % 33.9(L) 37.4 35.7(L)  Platelets 150 - 450 x10E3/uL 229 297 275    Patient has failed these meds in past: *** Patient is currently {CHL Controlled/Uncontrolled:[phone]} on the following medications:  [name] Vitamin b-12 1000 mcg/ml q3 month  We discussed:  ***  Plan  Continue {[facility] Upstream Pharmacy Plans:[phone]}  ***Stiff Person Syndrome   Patient has failed these meds in past: flexeril Patient is currently {CHL Controlled/Uncontrolled:[phone]} on the following medications:  . Baclofen 30 mg am and 20 mg daily . Diazepam 20 mg tid  We discussed:  ***  Plan  Continue {[facility] Upstream Pharmacy Plans:[phone]}    ***Urticaria   Patient has failed these meds in past: *** Patient is currently {CHL Controlled/Uncontrolled:[phone]} on the following medications:  . Hydroxyzine 100 mg tid prn itching . diphenhydramine 25 mg prn allergies/itching  We discussed:  ***  Plan  Continue {[facility] Upstream Pharmacy Plans:[phone]}   Health Maintenance   Patient is currently {CHL Controlled/Uncontrolled:[phone]} on the following medications:  . Epipen prn for severe allergic reaction . Cranberry-vitamin C-vitamin E *** . Vitamin C 1000 mg daily - *** . Vitamin D3 5000 units - *** . Vitamin E 400 units daily - *** . Gabapentin 300 mg bid - back pain  We discussed:  ***  Plan  Continue {[facility] Pharmacy GEZMO:[id number]}  Vaccines   Reviewed and discussed patient's vaccination history.    Immunization History  Administered Date(s) Administered  . Tdap 04/25/2016    Plan  Recommended patient receive *** vaccine in *** office.   Medication Management   Pt uses *** pharmacy for all medications Uses pill box? {Yes or If no, why not?:20788} Pt endorses ***% compliance  We discussed: ***  Plan  {US Pharmacy PTWS:56812}    Follow up: *** month  phone visit  ***

## 2020-06-24 NOTE — Progress Notes (Signed)
I have read the note, and I agree with the clinical assessment and plan.  Sherri [name]   

## 2020-06-25 ENCOUNTER — Telehealth: Payer: Medicare Other
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
## 2020-06-29 ENCOUNTER — Ambulatory Visit: Payer: Self-pay

## 2020-06-29 ENCOUNTER — Telehealth: Payer: Self-pay | Admitting: Nurse Practitioner

## 2020-06-29 ENCOUNTER — Telehealth: Payer: Self-pay

## 2020-06-29 DIAGNOSIS — R634 Abnormal weight loss: Secondary | ICD-10-CM

## 2020-06-29 DIAGNOSIS — E119 Type 2 diabetes mellitus without complications: Secondary | ICD-10-CM

## 2020-06-29 NOTE — Chronic Care Management (AMB) (Signed)
  Chronic Care Management   Outreach Note  06/29/2020 Name: Sherri [name] MRN: [id number] DOB: 16-Mar-1966  Referred by: Arnette Felts, FNP Reason for referral : Care Coordination   SW placed a second unsuccessful outbound call to the patient to assist with care coordination needs. SW left a HIPAA compliant voice message requesting a return call.  Follow Up Plan: SW will attempt to outreach the patient over the next 14 days.  [name], BSW, CDP Social Worker, Certified Dementia Practitioner [facility] Care Management [phone]

## 2020-06-29 NOTE — Chronic Care Management (AMB) (Signed)
  Chronic Care Management   Note  06/29/2020 Name: Jena Tegeler MRN: [id number] DOB: 10-19-1966  Eyleen Rawlinson is a 54 y.o. year old female who is a primary care patient of Arnette Felts, FNP and is actively engaged with the care management team. I reached out to [name] by phone today to assist with re-scheduling an initial visit with the Pharmacist.  Follow up plan: Telephone appointment with care management team member scheduled for: 07/26/2020  [facility] Guide, Embedded Care Coordination [facility]  [place], [place] [address] Direct Dial: [phone] [email or web address]

## 2020-06-30 ENCOUNTER — Other Ambulatory Visit: Payer: Self-pay

## 2020-06-30 ENCOUNTER — Other Ambulatory Visit (HOSPITAL_COMMUNITY)
Admission: RE | Admit: 2020-06-30 | Discharge: 2020-06-30 | Disposition: A | Discharge status: Home / Self Care | Payer: Medicare Other | Source: Ambulatory Visit | Attending: Obstetrics | Admitting: Obstetrics

## 2020-06-30 ENCOUNTER — Encounter: Payer: Self-pay | Admitting: Obstetrics

## 2020-06-30 ENCOUNTER — Ambulatory Visit (INDEPENDENT_AMBULATORY_CARE_PROVIDER_SITE_OTHER): Payer: Medicare Other | Admitting: Obstetrics

## 2020-06-30 VITALS — BP 109/71 | HR 69 | Ht 63.0 in | Wt 98.4 lb

## 2020-06-30 DIAGNOSIS — N898 Other specified noninflammatory disorders of vagina: Secondary | ICD-10-CM | POA: Insufficient documentation

## 2020-06-30 DIAGNOSIS — Z113 Encounter for screening for infections with a predominantly sexual mode of transmission: Secondary | ICD-10-CM | POA: Diagnosis not present

## 2020-06-30 DIAGNOSIS — Z1151 Encounter for screening for human papillomavirus (HPV): Secondary | ICD-10-CM | POA: Diagnosis not present

## 2020-06-30 DIAGNOSIS — Z01419 Encounter for gynecological examination (general) (routine) without abnormal findings: Secondary | ICD-10-CM | POA: Insufficient documentation

## 2020-06-30 DIAGNOSIS — R87618 Other abnormal cytological findings on specimens from cervix uteri: Secondary | ICD-10-CM | POA: Diagnosis not present

## 2020-06-30 NOTE — Progress Notes (Signed)
Patient presents for STD testing. Patient states that she did have some light spotting 2 years ago, but no bleeding since then.   Last pap: 02/2019 HPV Last MM: 10/2018
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
## 2020-07-01 ENCOUNTER — Other Ambulatory Visit: Payer: Self-pay | Admitting: Obstetrics

## 2020-07-01 DIAGNOSIS — N760 Acute vaginitis: Secondary | ICD-10-CM

## 2020-07-01 DIAGNOSIS — B9689 Other specified bacterial agents as the cause of diseases classified elsewhere: Secondary | ICD-10-CM

## 2020-07-01 LAB — CERVICOVAGINAL ANCILLARY ONLY
Bacterial Vaginitis (gardnerella): POSITIVE — AB
Candida Glabrata: NEGATIVE
Candida Vaginitis: NEGATIVE
Chlamydia: NEGATIVE
Comment: NEGATIVE
Comment: NEGATIVE
Comment: NEGATIVE
Comment: NEGATIVE
Comment: NEGATIVE
Comment: NORMAL
Neisseria Gonorrhea: NEGATIVE
Trichomonas: NEGATIVE

## 2020-07-01 LAB — HEPATITIS C ANTIBODY: Hep C Virus Ab: <0.1 s/co ratio (ref 0.0–0.9)

## 2020-07-01 LAB — RPR: RPR Ser Ql: NONREACTIVE

## 2020-07-01 LAB — HIV ANTIBODY (ROUTINE TESTING W REFLEX): HIV Screen 4th Generation wRfx: NONREACTIVE

## 2020-07-01 LAB — HEPATITIS B SURFACE ANTIGEN: Hepatitis B Surface Ag: NEGATIVE

## 2020-07-01 MED ORDER — METRONIDAZOLE 500 MG PO TABS: 500.0000 mg | ORAL_TABLET | Freq: Two times a day (BID) | ORAL | 2 refills | Status: DC

## 2020-07-02 ENCOUNTER — Telehealth: Payer: Self-pay

## 2020-07-02 NOTE — Telephone Encounter (Signed)
Called pt and advised of result and rx sent

## 2020-07-06 LAB — CYTOLOGY - PAP
Comment: NEGATIVE
Comment: NEGATIVE
Diagnosis: NEGATIVE
Diagnosis: REACTIVE
HPV 16: NEGATIVE
HPV 18 / 45: NEGATIVE
High risk HPV: POSITIVE — AB

## 2020-07-07 ENCOUNTER — Ambulatory Visit (INDEPENDENT_AMBULATORY_CARE_PROVIDER_SITE_OTHER): Payer: Medicare Other

## 2020-07-07 DIAGNOSIS — E119 Type 2 diabetes mellitus without complications: Secondary | ICD-10-CM

## 2020-07-07 DIAGNOSIS — R634 Abnormal weight loss: Secondary | ICD-10-CM

## 2020-07-07 NOTE — Patient Instructions (Signed)
Social Worker Visit Information  Goals we discussed today:  Goals Addressed              This Visit's Progress     Patient Stated   .  COMPLETED: "I am running out food and need housing resources" (pt-stated)        Current Barriers:  [name] Knowledge Deficits related to food and housing resources . Lacks caregiver support.  . Film/video editor.  . 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 New 03/18/2020 Over the next 90 days the patient will work with care management team to address ongoing concern of weight loss  CCM SW Interventions Completed 07/07/20 . Successful outbound call placed to the patient to assess goal progression . The patient reports she was recently seen by gynecologist and weight is up to 101 pounds; the patient feels this is her new appropriate weight as she weighed 101 pounds last summer as well . Reminded the patient of opportunity to access Glucerna nutrition samples as needed from her provider office . Requested Glucerna supplements be placed at the front desk for patient to pick up . Encouraged the patient to contact SW as needed for future resource needs . Collaboration with Consulting civil engineer and PCP to advise of SW plan to sign off as resources have been provided  Patient Self Care Activities:  . Patient verbalizes understanding of plan to f/u with her property manager regarding moving her into a clean apartment that is mold free . Self administers medications as prescribed . Attends all scheduled provider appointments . Calls pharmacy for medication refills . Performs ADL's independently . Performs IADL's independently . Calls provider office for new concerns or questions  Please see past updates related to this goal by  clicking on the "Past Updates" button in the selected goal          Follow Up Plan: No SW follow up planned at this time. The patient is encouraged to contact SW with future resource needs.  Daneen Schick, BSW, CDP Social Worker, Certified Dementia Practitioner [place] [facility] [place] [facility] [phone]

## 2020-07-07 NOTE — Chronic Care Management (AMB) (Signed)
Chronic Care Management    Social Work Follow Up Note  07/07/2020 Name: Derenda Giddings MRN: [id number] DOB: June 11, 1966  Sherri [name] is a 54 y.o. year old female who is a primary care patient of Minette Brine, [facility] [place]. The CCM team was consulted for assistance with care coordination.   Review of patient status, including review of consultants reports, other relevant assessments, and collaboration with appropriate care team members and the patient's provider was performed as part of comprehensive patient evaluation and provision of chronic care management services.    SDOH (Social Determinants of Health) assessments performed: No    Outpatient Encounter Medications as of 07/07/2020  Medication Sig Note  . albuterol (PROAIR HFA) 108 (90 Base) MCG/ACT inhaler Inhale into the lungs every 6 (six) hours as needed for wheezing or shortness of breath.   . Ascorbic Acid (VITAMIN C) 1000 MG tablet Take 1,000 mg by mouth daily.   . baclofen (LIORESAL) 20 MG tablet TAKE 1 AND 1/2 TABLETS BY  MOUTH IN THE MORNING AND IN THE EVENING AND 1 TABLET AT MIDDAY   . cetirizine (ZYRTEC) 10 MG tablet Take 1 tablet (10 mg total) by mouth daily.   . Cholecalciferol (VITAMIN D3) 5000 units CAPS Take by mouth.   . Cranberry-Vitamin C-Vitamin E (CRANBERRY PLUS VITAMIN C) 4200-20-3 MG-MG-UNIT CAPS Take by mouth.   . cyanocobalamin (,VITAMIN B-12,) 1000 MCG/ML injection Inject 1,000 mcg into the muscle every 3 (three) months.    . desloratadine (CLARINEX) 5 MG tablet TAKE 1 TABLET BY MOUTH  DAILY   . diazepam (VALIUM) 10 MG tablet Take 2 tablets (20 mg total) by mouth 3 (three) times daily.   . diphenhydrAMINE (BENADRYL) 25 MG tablet Take 25 mg by mouth at bedtime as needed for itching or allergies.  01/17/2017: Takes as needed  . EPINEPHRINE 0.3 mg/0.3 mL IJ SOAJ injection INJECT INTRAMUSCULARLY  0.3ML AS NEEDED FOR  ANAPHYLAXIS . SEEK MEDICAL  ATTENTION AFTER USE.   . famotidine (PEPCID) 20 MG tablet Take 1 tablet (20  mg total) by mouth 2 (two) times daily.   [name] FLUoxetine (PROZAC) 10 MG capsule TAKE 1 CAPSULE BY MOUTH  DAILY   . gabapentin (NEURONTIN) 300 MG capsule TAKE 1 CAPSULE BY MOUTH TWO TIMES DAILY   . glipiZIDE (GLUCOTROL) 5 MG tablet Take 5 mg by mouth daily before breakfast.   . Glucose Blood (ACCU-CHEK SIMPLICITY TEST STRP VI) by In Vitro route. Insert 1 by subcutaneous route 4 times every day check blood sugar before breakfast, lunch and dinner and bedtime   . hydrOXYzine (VISTARIL) 100 MG capsule TAKE 1 CAPSULE (100 MG TOTAL) BY MOUTH 3 (THREE) TIMES DAILY AS NEEDED FOR ITCHING.   . metFORMIN (GLUCOPHAGE) 500 MG tablet TAKE 2 TABLETS (1,000 MG TOTAL) BY MOUTH 2 (TWO) TIMES DAILY WITH A MEAL.   [name] metroNIDAZOLE (FLAGYL) 500 MG tablet Take 1 tablet (500 mg total) by mouth 2 (two) times daily.   . Olopatadine HCl (PAZEO) 0.7 % SOLN Apply 1 drop to eye as needed.    . vitamin E (VITAMIN E) 400 UNIT capsule Take 400 Units by mouth daily.    No facility-administered encounter medications on file as of 07/07/2020.     Goals Addressed              This Visit's Progress     Patient Stated   .  COMPLETED: "I am running out food and need housing resources" (pt-stated)        Current Barriers:  .  Knowledge Deficits related to food and housing resources . Lacks caregiver support.  . Film/video editor.  . 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 New 03/18/2020 Over the next 90 days the patient will work with care management team to address ongoing concern of weight loss  CCM SW Interventions Completed 07/07/20 . Successful outbound call placed to the patient to assess goal progression . The patient reports she was recently seen by gynecologist and weight is up to  101 pounds; the patient feels this is her new appropriate weight as she weighed 101 pounds last summer as well . Reminded the patient of opportunity to access Glucerna nutrition samples as needed from her provider office . Requested Glucerna supplements be placed at the front desk for patient to pick up . Encouraged the patient to contact SW as needed for future resource needs . Collaboration with Consulting civil engineer and PCP to advise of SW plan to sign off as resources have been provided  Patient Self Care Activities:  . Patient verbalizes understanding of plan to f/u with her property manager regarding moving her into a clean apartment that is mold free . Self administers medications as prescribed . Attends all scheduled provider appointments . Calls pharmacy for medication refills . Performs ADL's independently . Performs IADL's independently . Calls provider office for new concerns or questions  Please see past updates related to this goal by clicking on the "Past Updates" button in the selected goal          Follow Up Plan: No SW follow up planned at this time. The patient will remain active with RN Care Manager to address care management needs.   Daneen Schick, BSW, CDP Social Worker, Certified Dementia Practitioner [place] [facility] [place] [facility] [phone]  Total time spent performing care coordination and/or care management activities with the patient by phone or face to face = 23 minutes.

## 2020-07-14 ENCOUNTER — Telehealth: Payer: Self-pay

## 2020-07-16 ENCOUNTER — Inpatient Hospital Stay: Payer: Medicare Other

## 2020-07-16 ENCOUNTER — Inpatient Hospital Stay: Payer: Medicare Other | Admitting: Hematology & Oncology

## 2020-07-20 ENCOUNTER — Ambulatory Visit: Payer: Medicare Other | Admitting: Family

## 2020-07-20 ENCOUNTER — Other Ambulatory Visit: Payer: Medicare Other

## 2020-07-20 ENCOUNTER — Ambulatory Visit: Payer: Medicare Other

## 2020-07-25 ENCOUNTER — Other Ambulatory Visit: Payer: Self-pay | Admitting: Nurse Practitioner

## 2020-07-26 ENCOUNTER — Ambulatory Visit (INDEPENDENT_AMBULATORY_CARE_PROVIDER_SITE_OTHER): Payer: Medicare Other

## 2020-07-26 ENCOUNTER — Other Ambulatory Visit: Payer: Self-pay

## 2020-07-26 DIAGNOSIS — J45909 Unspecified asthma, uncomplicated: Secondary | ICD-10-CM | POA: Diagnosis not present

## 2020-07-26 DIAGNOSIS — G2582 Stiff-man syndrome: Secondary | ICD-10-CM

## 2020-07-26 DIAGNOSIS — E119 Type 2 diabetes mellitus without complications: Secondary | ICD-10-CM | POA: Diagnosis not present

## 2020-07-26 NOTE — Chronic Care Management (AMB) (Signed)
Chronic Care Management Pharmacy  Name: Sherri [name]  MRN: [id number] DOB: May 08, 1966  Chief Complaint/ HPI  Sherri [name],  54 y.o. , female presents for their Initial CCM visit with the clinical pharmacist via telephone due to COVID-19 Pandemic.  PCP : Minette Brine, FNP  Their chronic conditions include: Diabetes, Asthma, Depression and Stiff Person Syndrome  Office Visits: 07/16/20 MyChart message: Pt notified urine drug screen was positive for cocaine and benzodiazepine. Advised pt she could be set up for drug rehab.   06/22/20 Telephone call w/ [facility]: Verified w/ [facility] that pt is supposed to be taking BOTH desloratadine and Zyrtec per PCP.   06/03/20 AWV and OV: Pt reports low appetite. Significant weight loss. Urine drug screen ordered due to history of cocaine use, pt denies current use. HgbA1c decreased to 6.4%. TSH and B12 normal.   02/16/20 OV:  Metformin changed to XR to help with nausea. Take glipizide in the middle of the day to help with nausea. Recent episode of disorientation may have been related to being sleepy.   01/29/20 Telephone call: Pt advised to take 1 tablet glipizide daily and schedule follow up in 8-12 weeks.  Consult Visits: 06/30/20 Gynecology OV w/ Dr. Jodi Mourning: Flagyl '500mg'$  twice daily for 7 days started for bacterial vaginosis. STD screen performed. 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   06/22/20 Neurology OV: Presented for follow up of stiff person syndrome. Stable on baclofen and diazepam. Also taking Prozac and gabapentin prescribed by this office. No medication changes.   03/15/20 Oncology telephone call: Notified pt that iron results from 3/17 are within normal limits.  03/10/20 Oncology OV w/ Dr. Marin Olp: Administered B12 injection today. Significant weight loss noted. Pt was given food from food pantry.   CCM Encounters: 06/04/20 RN: Evaluation of treatment plan related to abnormal weight loss. Collaboration with SW regarding Glucerna  samples.   05/17/20 SW: Pt feeling better, no longer complaining of cough. Samples of Glucerna available at office. Reviewed diet with patient.   05/13/20 SW: Pt reports not feelings well and continued weight loss. Pt had recent fall.   03/18/20 SW: Pt concerned about weight loss. Glucerna samples picked up from office. Pt concerned weight loss is due to cancer.  03/11/20 SW: Collaboration with [employer] for 4 week extension of food delivery  03/08/20 SW: Pt received notice that she will be evicted on 17th due to lack of payment. Pt states she has paid rent on time and paid extra the last 3 months for pest control. Pt contacted corporate office and awaiting response. Advised pt to contact [employer] regarding notice. Someone has arrived to begin removing mold from pt's apartment.    03/05/20 PharmD: Discussed GLP1 versus SGLT2. Recommend repeat lipid panel and A1c. Pt states BG <70 and <200.  03/05/20 SW: Matilde Bash, home inspector with [facility], to determine what information is needed on requisition order  03/02/20 SW: Referral via [place] to [employer].  02/17/20 SW: Referral placed to [employer] to obtain 4 weeks of diabetic friendly meals for pt. Collaboration with [employer] to request Glucerna samples be sent to PCP office. Pt contacted Doristine Locks in [place] with [facility] regarding mold concerns in home. Awaiting a return call.   02/10/20 RN: Pt states she is doing better and was just sleepy yesterday. FBG was 62 this morning. Pt no longer receiving food stamps. Unable to afford Ensure or Glucerna. Collaborate with SW.   02/09/20 SW: During  call pt was slurring her words and sounded lethargic. States she was unable to check BG and had not taken her medications for the day. SW called 911 to go to pt's home. Upon EMS arrival BP 92/58 and BG 368. Pt refused medical treatment and transport to the hospital.   Medications: Outpatient  Encounter Medications as of 07/26/2020  Medication Sig Note  . albuterol (PROAIR HFA) 108 (90 Base) MCG/ACT inhaler Inhale into the lungs every 6 (six) hours as needed for wheezing or shortness of breath.   . Ascorbic Acid (VITAMIN C) 1000 MG tablet Take 1,000 mg by mouth daily.   . baclofen (LIORESAL) 20 MG tablet TAKE 1 AND 1/2 TABLETS BY  MOUTH IN THE MORNING AND IN THE EVENING AND 1 TABLET AT MIDDAY   . cetirizine (ZYRTEC) 10 MG tablet Take 1 tablet (10 mg total) by mouth daily.   . Cholecalciferol (VITAMIN D3) 10 MCG (400 UNIT) tablet Take 1 each by mouth daily.    . cyanocobalamin (,VITAMIN B-12,) 1000 MCG/ML injection Inject 1,000 mcg into the muscle every 3 (three) months.    . diphenhydrAMINE (BENADRYL) 25 MG tablet Take 25 mg by mouth at bedtime as needed for itching or allergies.  (Patient not taking: Reported on 07/27/2020) 01/17/2017: Takes as needed  . FLUoxetine (PROZAC) 10 MG capsule TAKE 1 CAPSULE BY MOUTH  DAILY   . gabapentin (NEURONTIN) 300 MG capsule TAKE 1 CAPSULE BY MOUTH TWO TIMES DAILY (Patient taking differently: Take 300 mg by mouth 2 (two) times daily. TAKE 1 CAPSULE BY MOUTH TWO TIMES DAILY)   . Glucose Blood (ACCU-CHEK SIMPLICITY TEST STRP VI) by In Vitro route. Insert 1 by subcutaneous route 4 times every day check blood sugar before breakfast, lunch and dinner and bedtime   . metFORMIN (GLUCOPHAGE) 500 MG tablet TAKE 2 TABLETS (1,000 MG TOTAL) BY MOUTH 2 (TWO) TIMES DAILY WITH A MEAL.   [name] Olopatadine HCl (PAZEO) 0.7 % SOLN Apply 1 drop to eye as needed.    . [DISCONTINUED] desloratadine (CLARINEX) 5 MG tablet TAKE 1 TABLET BY MOUTH  DAILY   . [DISCONTINUED] diazepam (VALIUM) 10 MG tablet Take 2 tablets (20 mg total) by mouth 3 (three) times daily.   . [DISCONTINUED] EPINEPHRINE 0.3 mg/0.3 mL IJ SOAJ injection INJECT INTRAMUSCULARLY  0.3ML AS NEEDED FOR  ANAPHYLAXIS . SEEK MEDICAL  ATTENTION AFTER USE. (Patient not taking: Reported on 07/27/2020)   . [DISCONTINUED] famotidine  (PEPCID) 20 MG tablet TAKE 1 TABLET BY MOUTH TWICE A DAY   . [DISCONTINUED] glipiZIDE (GLUCOTROL) 5 MG tablet Take 5 mg by mouth daily before breakfast. Takes at lunch   . [DISCONTINUED] hydrOXYzine (VISTARIL) 100 MG capsule TAKE 1 CAPSULE (100 MG TOTAL) BY MOUTH 3 (THREE) TIMES DAILY AS NEEDED FOR ITCHING.   . Cranberry-Vitamin C-Vitamin E (CRANBERRY PLUS VITAMIN C) 4200-20-3 MG-MG-UNIT CAPS Take by mouth. (Patient not taking: Reported on 07/26/2020)   . metroNIDAZOLE (FLAGYL) 500 MG tablet Take 1 tablet (500 mg total) by mouth 2 (two) times daily. (Patient not taking: Reported on 07/27/2020)   . vitamin E (VITAMIN E) 400 UNIT capsule Take 400 Units by mouth daily. (Patient not taking: Reported on 07/26/2020)    No facility-administered encounter medications on file as of 07/26/2020.    Current Diagnosis/Assessment:  SDOH Interventions     Most Recent Value  SDOH Interventions  Financial Strain Interventions Intervention Not Indicated      Goals Addressed  This Visit's Progress   . Pharmacy Care Plan         Diabetes   A1c goal <7%  Recent Relevant Labs: Lab Results  Component Value Date/Time   HGBA1C 6.4 (H) 06/03/2020 05:00 PM   HGBA1C 8.5 (H) 01/15/2020 12:12 PM   MICROALBUR 30 06/04/2020 12:29 PM    Last diabetic Eye exam: No results found for: HMDIABEYEEXA  Last diabetic Foot exam: No results found for: HMDIABFOOTEX   Checking BG: 3 times per week  Recent FBG Readings: 62 lowest, 85-100 Recent pre-meal BG readings:  Recent 2hr PP BG readings:   Recent HS BG readings:   Patient has failed these meds in past: N/A Patient is currently controlled on the following medications: [name] Glipizide '5mg'$  daily (Per recent note suggested to take midday) . Metformin '500mg'$  2 tablets twice daily with a meal  We discussed:  . Takes glipizide at lunch now Diet extensively Tries to limit carbohydrates (states she can have 45g per meal) Eats salads and fruit Says she eats 8  small meals a day Fried egg sandwich, oatmeal, baked chicken Some fast food (salads, chicken nuggets) Does not use salt, uses pepper Does not eat much fried food No sweets Recommend balanced meal with lean protein, vegetables, and small serving of carbohydrates Reports that she drinks about 32 ounces of water daily Recommend pt drink 64 ounces of water daily Exercise extensively Walks with a cane Typical weekly workout includes 30 squats and 30 leg lifts daily . Checks BG three times daily when she checks 3 times per week  Plan Continue current medications   Hyperlipidemia   LDL goal < 100  Lipid Panel     Component Value Date/Time   CHOL 172 06/03/2020 1700   TRIG 76 06/03/2020 1700   HDL 75 06/03/2020 1700   LDLCALC 83 06/03/2020 1700    Hepatic Function Latest Ref Rng & Units 07/27/2020 06/03/2020 03/10/2020  Total Protein 6.5 - 8.1 g/dL 7.1 6.9 7.3  Albumin 3.5 - 5.0 g/dL 4.5 4.0 4.8  AST 15 - 41 U/L 15 12 12(L)  ALT 0 - 44 U/L '21 11 12  '$ Alk Phosphatase 38 - 126 U/L 72 79 60  Total Bilirubin 0.3 - 1.2 mg/dL 0.2(L) <0.2 0.3     The 10-year ASCVD risk score Mikey Bussing DC Jr., et al., 2013) is: 2.1%   Values used to calculate the score:     Age: 20 years     Sex: Female     Is Non-Hispanic African American: Yes     Diabetic: Yes     Tobacco smoker: No     Systolic Blood Pressure: 924 mmHg     Is BP treated: No     HDL Cholesterol: 75 mg/dL     Total Cholesterol: 172 mg/dL   Patient has failed these meds in past: N/A Patient is currently controlled on the following medications:  . N/A  We discussed:  . Diet and exercise extensively  Plan Continue control with diet and exercise  Consider moderate intensity statin due to diabetes history  Depression   Depression screen [facility] 2/9 06/03/2020 02/16/2020 01/15/2020  Decreased Interest 0 0 0  Down, Depressed, Hopeless 0 0 0  PHQ - 2 Score 0 0 0  Altered sleeping 0 - 0  Tired, decreased energy 0 - 0  Change in appetite  0 - 0  Feeling bad or failure about yourself  0 - 0  Trouble concentrating 0 - 0  Moving  slowly or fidgety/restless 0 - 0  Suicidal thoughts 0 - 0  PHQ-9 Score 0 - 0  Difficult doing work/chores Not difficult at all - Not difficult at all  Some recent data might be hidden   Patient has failed these meds in past: Lexapro, alprazolam Patient is currently controlled on the following medications:  . Fluoxetine '10mg'$  daily  We discussed:    Diagnosed after being injured at work and being diagnosed with stiff person syndrome  Plan Continue current medications  Stiff Person Syndrome   Patient has failed these meds in past: N/A Patient is currently controlled on the following medications:   Baclofen '20mg'$  1.5 tablets in the morning and evening and 1 tablet midday  Diazepam '10mg'$  2 tablets three times daily  Gabapentin '300mg'$  2 capsules twice daily  We discussed:   Has to take medications as prescribed in order for disease not to progress  Pt reports issues with falls previously  Advised patient to watch for signs/symptoms of CNS depression (drowsiness, trouble breathing, etc)  Pt mentioned being tired recently  She wakes up at 4:30/5:30am, eats breakfast and then takes morning medications, then she goes back to sleep for a couple of hours  Pt mentioned that she goes back to sleep to get the medications out of her system and so she can drive  Advised pt that going back to sleep does not get the medications out of her system and that several of the medications that she takes take several hours to get to peak effect in her system  Pt then wakes up around 9 am, watches TV, eat and then goes back to sleep  Pt did not state how long she is sleeping, but states that she is sleeping a lot  Plan Continue current medications  Asthma/ Allergies   Patient has failed these meds in past: Advair, [place] Patient is currently controlled on the following medications:   Cetirizine '10mg'$   daily  Desloratadine '5mg'$  daily  Diphenhydramine '25mg'$  at bedtime as needed for itching or allergies  Hydroxyzine pamoate '100mg'$  three times daily as needed for itching  Olopatadine 0.7% 1 drop to eye as needed  Epipen as needed for anaphylaxis  Ventolin HFA every 6 hours as needed for wheezing or SOB  We discussed:   Proper Epipen storage and that pt can use until expiration date on box  Using hydroxyzine for hives possibly due to mold  Zyrtec and desloratadine originally started for allergic reaction and this combination is all that helped get reaction under control  Pt states that she feels like Proair works faster than Ventolin does   Explained that both medication have the same active ingredient and mechanism of action  Plan Continue current medications  Osteopenia / Osteoporosis   Last DEXA Scan: last ordered 10/24/18, expired  T-Score femoral neck:   T-Score total hip:   T-Score lumbar spine:   T-Score forearm radius:   10-year probability of major osteoporotic fracture:   10-year probability of hip fracture:   Vitamin D, 25-Hydroxy  Date Value Ref Range Status  12/26/2016 74.0 30.0 - 100.0 ng/mL Final    Comment:    Vitamin D deficiency has been defined by the Institute of Medicine and an Endocrine Society practice guideline as a level of serum 25-OH vitamin D less than 20 ng/mL (1,2). The Endocrine Society went on to further define vitamin D insufficiency as a level between 21 and 29 ng/mL (2). 1. IOM (Institute of Medicine). 2010. Dietary reference  intakes for calcium and D. [place]: The    [employer]. 2. Holick MF, [place], Bischoff-Ferrari HA, et al.    Evaluation, treatment, and prevention of vitamin D    deficiency: an Endocrine Society clinical practice    guideline. JCEM. 2011 Jul; 96(7):1911-30.     Patient is not a candidate for pharmacologic treatment  Patient has failed these meds in past: Ergocalciferol  Patient  is currently controlled on the following medications:  . Cholecalciferol 5000 units daly  We discussed:  Recommend [phone] units of vitamin D daily.   Discuss at follow up  Plan Continue current medications  Recommend DEXA scan  Health Maintenance   Patient is currently on the following medications:  [name] Vitamin E 400 units daily . Vitamin C '1000mg'$  daily . Vitamin B12 1023mg injection every 3 months . Cranberry/Vitamin C/Vitamin E daily . Famotidine '20mg'$  twice daily  We discussed:   . B12 injection for pernicious anemia every 3 months . Famotidine helps with acid reflux symptoms . Cranberry tablets help with UTI prevention  Plan Continue current medications   Vaccines   Reviewed and discussed patient's vaccination history.    Immunization History  Administered Date(s) Administered  . Tdap 04/25/2016   Plan Review and discuss at follow up  Medication Management   Pt uses [facility] for all medications Uses pill box? Yes Pt endorses 100% compliance  We discussed:  . Importance of taking each medications daily as directed . Pt only really has to pay for OTC vitamins, most other medication she gets through [employer] [facility]  Plan Continue current medication management strategy  Follow up: 3 month phone visit  CJannette Fogo PharmD Clinical Pharmacist [facility] [phone]

## 2020-07-27 ENCOUNTER — Inpatient Hospital Stay (HOSPITAL_BASED_OUTPATIENT_CLINIC_OR_DEPARTMENT_OTHER): Payer: Medicare Other | Admitting: Family

## 2020-07-27 ENCOUNTER — Inpatient Hospital Stay: Payer: Medicare Other | Attending: Hematology & Oncology

## 2020-07-27 ENCOUNTER — Encounter: Payer: Self-pay | Admitting: Family

## 2020-07-27 ENCOUNTER — Inpatient Hospital Stay: Payer: Medicare Other

## 2020-07-27 VITALS — BP 100/70 | HR 84 | Temp 98.1°F | Resp 19 | Ht 63.0 in | Wt 98.4 lb

## 2020-07-27 DIAGNOSIS — Z886 Allergy status to analgesic agent status: Secondary | ICD-10-CM | POA: Insufficient documentation

## 2020-07-27 DIAGNOSIS — D509 Iron deficiency anemia, unspecified: Secondary | ICD-10-CM | POA: Diagnosis not present

## 2020-07-27 DIAGNOSIS — D510 Vitamin B12 deficiency anemia due to intrinsic factor deficiency: Secondary | ICD-10-CM

## 2020-07-27 DIAGNOSIS — Z880 Allergy status to penicillin: Secondary | ICD-10-CM | POA: Insufficient documentation

## 2020-07-27 DIAGNOSIS — Z86718 Personal history of other venous thrombosis and embolism: Secondary | ICD-10-CM | POA: Diagnosis not present

## 2020-07-27 DIAGNOSIS — D500 Iron deficiency anemia secondary to blood loss (chronic): Secondary | ICD-10-CM

## 2020-07-27 DIAGNOSIS — D508 Other iron deficiency anemias: Secondary | ICD-10-CM | POA: Diagnosis not present

## 2020-07-27 DIAGNOSIS — R5383 Other fatigue: Secondary | ICD-10-CM | POA: Diagnosis not present

## 2020-07-27 DIAGNOSIS — Z79899 Other long term (current) drug therapy: Secondary | ICD-10-CM | POA: Insufficient documentation

## 2020-07-27 LAB — CBC WITH DIFFERENTIAL (CANCER CENTER ONLY)
Abs Immature Granulocytes: 0.04 10*3/uL (ref 0.00–0.07)
Basophils Absolute: 0 10*3/uL (ref 0.0–0.1)
Basophils Relative: 0 %
Eosinophils Absolute: 0 10*3/uL (ref 0.0–0.5)
Eosinophils Relative: 0 %
HCT: 35.5 % — ABNORMAL LOW (ref 36.0–46.0)
Hemoglobin: 11.7 g/dL — ABNORMAL LOW (ref 12.0–15.0)
Immature Granulocytes: 1 %
Lymphocytes Relative: 38 %
Lymphs Abs: 2 10*3/uL (ref 0.7–4.0)
MCH: 28.7 pg (ref 26.0–34.0)
MCHC: 33 g/dL (ref 30.0–36.0)
MCV: 87.2 fL (ref 80.0–100.0)
Monocytes Absolute: 0.3 10*3/uL (ref 0.1–1.0)
Monocytes Relative: 6 %
Neutro Abs: 2.9 10*3/uL (ref 1.7–7.7)
Neutrophils Relative %: 55 %
Platelet Count: 255 10*3/uL (ref 150–400)
RBC: 4.07 MIL/uL (ref 3.87–5.11)
RDW: 13.2 % (ref 11.5–15.5)
WBC Count: 5.3 10*3/uL (ref 4.0–10.5)
nRBC: 0 % (ref 0.0–0.2)

## 2020-07-27 LAB — CMP (CANCER CENTER ONLY)
ALT: 21 U/L (ref 0–44)
AST: 15 U/L (ref 15–41)
Albumin: 4.5 g/dL (ref 3.5–5.0)
Alkaline Phosphatase: 72 U/L (ref 38–126)
Anion gap: 7 (ref 5–15)
BUN: 12 mg/dL (ref 6–20)
CO2: 30 mmol/L (ref 22–32)
Calcium: 10.6 mg/dL — ABNORMAL HIGH (ref 8.9–10.3)
Chloride: 104 mmol/L (ref 98–111)
Creatinine: 1.16 mg/dL — ABNORMAL HIGH (ref 0.44–1.00)
GFR, Est AFR Am: >60 mL/min (ref >60)
GFR, Estimated: 53 mL/min — ABNORMAL LOW (ref >60)
Glucose, Bld: 205 mg/dL — ABNORMAL HIGH (ref 70–99)
Potassium: 4.2 mmol/L (ref 3.5–5.1)
Sodium: 141 mmol/L (ref 135–145)
Total Bilirubin: 0.2 mg/dL — ABNORMAL LOW (ref 0.3–1.2)
Total Protein: 7.1 g/dL (ref 6.5–8.1)

## 2020-07-27 LAB — RETICULOCYTES
Immature Retic Fract: 5.4 % (ref 2.3–15.9)
RBC.: 4.05 MIL/uL (ref 3.87–5.11)
Retic Count, Absolute: 61.6 10*3/uL (ref 19.0–186.0)
Retic Ct Pct: 1.5 % (ref 0.4–3.1)

## 2020-07-27 LAB — VITAMIN B12: Vitamin B-12: 163 pg/mL — ABNORMAL LOW (ref 180–914)

## 2020-07-27 MED ORDER — CYANOCOBALAMIN 1000 MCG/ML IJ SOLN
1000.0000 ug | Freq: Once | INTRAMUSCULAR | Status: AC
  Administered 2020-07-27: 1000 ug via INTRAMUSCULAR

## 2020-07-27 NOTE — Progress Notes (Signed)
Hematology and Oncology Follow Up Visit  Sherri [name] [id number] [birth year] 54 y.o. 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 She is doing well but has some fatigue at times if she is not able to sleep after taking her medication.  She has not noted any episodes ort bleeding. No abnormal bruising or petechiae.  No fever, chills, n/v, cough, rash, dizziness, SOB, chest pain, palpitations, abdominal pain or changes in bowel or bladder habits.  No swelling, tenderness, numbness or tingling in her extremities.  She ambulates with a cane for added support.  No falls or syncopal episodes to report.  Her appetite comes and goes. She is enjoying fruit right now and staying well hydrated. Her weight is stable at 98 lbs.   ECOG Performance Status: 1 - Symptomatic but completely ambulatory  Medications:  Allergies as of 07/27/2020      Reactions   Ibuprofen Other (See Comments)   Does not take due to hx of renal insufficiency "I have kidney disease"    Lemon Flavor Swelling   Severe Lip Swelling  FRUIT per pt.     Amoxicillin Diarrhea, Other (See Comments)   Tylenol [acetaminophen] Hives   Cannot take large quantities      Medication List       Accurate as of July 27, 2020  2:17 PM. If you have any questions, ask your nurse or doctor.        ACCU-CHEK SIMPLICITY TEST STRP VI by In Vitro route. Insert 1 by subcutaneous route 4 times every day check blood sugar before breakfast, lunch and dinner and bedtime   baclofen 20 MG tablet Commonly known as: LIORESAL TAKE 1 AND 1/2 TABLETS BY  MOUTH IN THE MORNING AND IN THE EVENING AND 1 TABLET AT MIDDAY   cetirizine 10 MG tablet Commonly known as: ZYRTEC Take 1  tablet (10 mg total) by mouth daily.   Cranberry Plus Vitamin C 4200-20-3 MG-MG-UNIT Caps Generic drug: Cranberry-Vitamin C-Vitamin E Take by mouth.   cyanocobalamin 1000 MCG/ML injection Commonly known as: (VITAMIN B-12) Inject 1,000 mcg into the muscle every 3 (three) months.   desloratadine 5 MG tablet Commonly known as: CLARINEX TAKE 1 TABLET BY MOUTH  DAILY   diazepam 10 MG tablet Commonly known as: VALIUM Take 2 tablets (20 mg total) by mouth 3 (three) times daily.   diphenhydrAMINE 25 MG tablet Commonly known as: BENADRYL Take 25 mg by mouth at bedtime as needed for itching or allergies.   EPINEPHrine 0.3 mg/0.3 mL Soaj injection Commonly known as: EPI-PEN INJECT INTRAMUSCULARLY  0.3ML AS NEEDED FOR  ANAPHYLAXIS . SEEK MEDICAL  ATTENTION AFTER USE.   famotidine 20 MG tablet Commonly known as: PEPCID TAKE 1 TABLET BY MOUTH TWICE A DAY   FLUoxetine 10 MG capsule Commonly known as: PROZAC TAKE 1 CAPSULE BY MOUTH  DAILY   gabapentin 300 MG capsule Commonly known as: NEURONTIN TAKE 1 CAPSULE BY MOUTH TWO TIMES DAILY What changed:   how much to take  how to take this  when to take this   glipiZIDE 5 MG tablet Commonly known as: GLUCOTROL Take 5 mg by mouth daily before breakfast. Takes at lunch   hydrOXYzine 100 MG capsule Commonly  known as: VISTARIL TAKE 1 CAPSULE (100 MG TOTAL) BY MOUTH 3 (THREE) TIMES DAILY AS NEEDED FOR ITCHING.   metFORMIN 500 MG tablet Commonly known as: GLUCOPHAGE TAKE 2 TABLETS (1,000 MG TOTAL) BY MOUTH 2 (TWO) TIMES DAILY WITH A MEAL.   metroNIDAZOLE 500 MG tablet Commonly known as: FLAGYL Take 1 tablet (500 mg total) by mouth 2 (two) times daily.   Pazeo 0.7 % Soln Generic drug: Olopatadine HCl Apply 1 drop to eye as needed.   ProAir HFA 108 (90 Base) MCG/ACT inhaler Generic drug: albuterol Inhale into the lungs every 6 (six) hours as needed for wheezing or shortness of breath.   vitamin C 1000 MG tablet Take 1,000 mg  by mouth daily.   Vitamin D3 10 MCG (400 UNIT) tablet Take 1 each by mouth daily.   vitamin E 180 MG (400 UNITS) capsule Generic drug: vitamin E Take 400 Units by mouth daily.       Allergies:  Allergies  Allergen Reactions  . Ibuprofen Other (See Comments)    Does not take due to hx of renal insufficiency "I have kidney disease"   . Lemon Flavor Swelling    Severe Lip Swelling  FRUIT per pt.    . Amoxicillin Diarrhea and Other (See Comments)  . Tylenol [Acetaminophen] Hives    Cannot take large quantities    Past Medical History, Surgical history, Social history, and Family History were reviewed and updated.  Review of Systems: All other 10 point review of systems is negative.   Physical Exam:  height is 5\' 3"  (1.6 m) and weight is 98 lb 6.4 oz (44.6 kg). Her oral temperature is 98.1 F (36.7 C). Her blood pressure is 100/70 and her pulse is 84. Her respiration is 19 and oxygen saturation is 100%.   Wt Readings from Last 3 Encounters:  07/27/20 98 lb 6.4 oz (44.6 kg)  06/30/20 98 lb 6.4 oz (44.6 kg)  06/22/20 101 lb (45.8 kg)    Ocular: Sclerae unicteric, pupils equal, round and reactive to light Ear-nose-throat: Oropharynx clear, dentition fair Lymphatic: No cervical or supraclavicular adenopathy Lungs no rales or rhonchi, good excursion bilaterally Heart regular rate and rhythm, no murmur appreciated Abd soft, nontender, positive bowel sounds, no liver or spleen tip palpated on exam, no fluid wave  MSK no focal spinal tenderness, no joint edema Neuro: non-focal, well-oriented, appropriate affect Breasts: Deferred   Lab Results  Component Value Date   WBC 5.3 07/27/2020   HGB 11.7 (L) 07/27/2020   HCT 35.5 (L) 07/27/2020   MCV 87.2 07/27/2020   PLT 255 07/27/2020   Lab Results  Component Value Date   FERRITIN 733 (H) 03/10/2020   IRON 79 03/10/2020   TIBC 254 03/10/2020   UIBC 175 03/10/2020   IRONPCTSAT 31 03/10/2020   Lab Results  Component  Value Date   RETICCTPCT 1.5 07/27/2020   RBC 4.05 07/27/2020   RETICCTABS 71.8 12/14/2015   Lab Results  Component Value Date   KPAFRELGTCHN 1.06 08/02/2011   LAMBDASER 1.49 08/02/2011   KAPLAMBRATIO 0.71 08/02/2011   Lab Results  Component Value Date   IGGSERUM 1180 08/02/2011   IGA 206 08/02/2011   IGMSERUM 198 08/02/2011   Lab Results  Component Value Date   TOTALPROTELP 6.6 08/02/2011   ALBUMINELP 57.1 08/02/2011   A1GS 4.1 08/02/2011   A2GS 9.6 08/02/2011   BETS 6.1 08/02/2011   BETA2SER 5.9 08/02/2011   GAMS 17.2 08/02/2011   MSPIKE NOT DET 08/02/2011  SPEI * 08/02/2011     Chemistry      Component Value Date/Time   NA 141 07/27/2020 1316   NA 141 06/03/2020 1700   NA 136 11/23/2017 1147   NA 144 03/22/2017 0943   K 4.2 07/27/2020 1316   K 3.9 11/23/2017 1147   K 4.2 03/22/2017 0943   CL 104 07/27/2020 1316   CL 94 (L) 11/23/2017 1147   CO2 30 07/27/2020 1316   CO2 30 11/23/2017 1147   CO2 29 03/22/2017 0943   BUN 12 07/27/2020 1316   BUN 15 06/03/2020 1700   BUN 10 11/23/2017 1147   BUN 11.1 03/22/2017 0943   CREATININE 1.16 (H) 07/27/2020 1316   CREATININE 1.0 11/23/2017 1147   CREATININE 1.0 03/22/2017 0943   GLU 88 09/05/2018 1535      Component Value Date/Time   CALCIUM 10.6 (H) 07/27/2020 1316   CALCIUM 10.1 11/23/2017 1147   CALCIUM 9.9 03/22/2017 0943   ALKPHOS 72 07/27/2020 1316   ALKPHOS 174 (H) 11/23/2017 1147   ALKPHOS 114 03/22/2017 0943   AST 15 07/27/2020 1316   AST 18 03/22/2017 0943   ALT 21 07/27/2020 1316   ALT 46 11/23/2017 1147   ALT 23 03/22/2017 0943   BILITOT 0.2 (L) 07/27/2020 1316   BILITOT 0.24 03/22/2017 0943       Impression and Plan: Ms. Jantz is a pleasant 54 yo African American female with both B 12 and intermittent iron deficiency. She received her B 12 today as planned.  We will see what her iron studies look like and replace if needed.  We will see her again in another 3 months.  She can contact our  office with any questions or concerns.   [name], NP 8/3/20212:17 PM

## 2020-07-27 NOTE — Patient Instructions (Signed)

## 2020-07-28 LAB — IRON AND TIBC
Iron: 72 ug/dL (ref 41–142)
Saturation Ratios: 30 % (ref 21–57)
TIBC: 244 ug/dL (ref 236–444)
UIBC: 172 ug/dL (ref 120–384)

## 2020-07-28 LAB — FERRITIN: Ferritin: 622 ng/mL — ABNORMAL HIGH (ref 11–307)

## 2020-08-12 ENCOUNTER — Other Ambulatory Visit: Payer: Self-pay | Admitting: Neurology

## 2020-08-12 ENCOUNTER — Other Ambulatory Visit: Payer: Self-pay | Admitting: Nurse Practitioner

## 2020-08-12 ENCOUNTER — Telehealth: Payer: Self-pay | Admitting: Neurology

## 2020-08-12 DIAGNOSIS — R21 Rash and other nonspecific skin eruption: Secondary | ICD-10-CM

## 2020-08-12 NOTE — Telephone Encounter (Signed)
Pt called [facility] told Pt to call to let NP know pharmacy need a prescription for diazepam (VALIUM) 10 MG tablet. Pt would like a call from the nurse.

## 2020-08-12 NOTE — Telephone Encounter (Signed)
This was sent to provider to sign.

## 2020-08-13 ENCOUNTER — Other Ambulatory Visit: Payer: Self-pay | Admitting: Nurse Practitioner

## 2020-08-19 ENCOUNTER — Other Ambulatory Visit: Payer: Self-pay | Admitting: Nurse Practitioner

## 2020-08-24 ENCOUNTER — Other Ambulatory Visit: Payer: Self-pay | Admitting: Nurse Practitioner

## 2020-08-24 NOTE — Patient Instructions (Addendum)
Visit Information  Goals Addressed            This Visit's Progress   . Pharmacy Care Plan       CARE PLAN ENTRY (see longitudinal plan of care for additional care plan information)  Current Barriers:  . 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 Pharmacist Clinical Goal(s): o Over the next 180 days, patient will work with PharmD and providers to maintain A1c goal <7% . Current regimen:  [name] Glipizide 5mg  daily  . Metformin 500mg  2 tablets twice daily with a meal . Interventions: o Provided dietary and exercise recommendations . 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 Current regimen:   Baclofen 20mg  1.5 tablets in the morning and evening and 1 tablet midday  Diazepam 10mg  2 tablets three times daily  Gabapentin 300mg  2 capsules twice daily . Interventions: o Provided patient education regarding onset of action of medications and that sleeping does not help to wear off medications . Patient self care activities - Over the next 90 days, patient will: o Take medications daily as directed o Monitor for signs and symptoms of CNS depression (drowsiness, respiratory depression , etc)  Asthma/Allergies . Pharmacist Clinical Goal(s) o Over the next 180 days, patient will work with PharmD and providers to control symptoms of asthma  and allergies . Current regimen:   Cetirizine 10mg  daily  Desloratadine 5mg  daily  Diphenhydramine 25mg  at bedtime as needed for itching or allergies  Hydroxyzine pamoate 100mg  three times daily as needed for itching  Olopatadine 0.7% 1 drop to eye as needed  Epipen as needed for anaphylaxis  Ventolin HFA every 6 hours as needed for wheezing or SOB . Interventions: o Discussed proper storage of Epipen and expiration date o Provided patient education regarding same active ingredients present in Proair and Ventolin . Patient self care activities - Over the next 180 days, patient will: o Use medication daily as directed o Avoid triggers if possible  Medication management . Pharmacist Clinical Goal(s): o Over the next 180 days, patient will work with PharmD and providers to maintain optimal medication adherence . Current pharmacy: [facility] . Interventions o Comprehensive medication review performed. o Continue current medication management strategy . Patient self care activities - Over the next 180 days, patient will: o Focus on medication adherence by utilizing a pill box o Take medications as prescribed o Report any questions or concerns to PharmD and/or provider(s)  Initial goal documentation        Sherri [name] was given information about Chronic Care Management services today including:  1. CCM service includes personalized support from designated clinical staff supervised by her physician, including individualized plan of care and coordination with other care providers 2. 24/7 contact phone numbers for assistance for urgent and routine care needs. 3. Standard insurance, coinsurance, copays and deductibles apply for chronic  care management only during months in which we provide at least 20 minutes of these services. Most insurances cover these services at 100%, however patients may be responsible for any copay, coinsurance and/or deductible if applicable. This service may help  you avoid the need for more expensive face-to-face services. 4. Only one practitioner may furnish and bill the service in a calendar month. 5. The patient may stop CCM services at any time (effective at the end of the month) by phone call to the office staff.  Patient agreed to services and verbal consent obtained.   The patient verbalized understanding of instructions provided today and agreed to receive a mailed copy of patient instruction and/or educational materials. Telephone follow up appointment with pharmacy team member scheduled for: 10/25/20 @ 3:30 PM  Beryle Flock, PharmD Clinical Pharmacist [facility] [phone]   Diabetes Mellitus and Nutrition, Adult When you have diabetes (diabetes mellitus), it is very important to have healthy eating habits because your blood sugar (glucose) levels are greatly affected by what you eat and drink. Eating healthy foods in the appropriate amounts, at about the same times every day, can help you:  Control your blood glucose.  Lower your risk of heart disease.  Improve your blood pressure.  Reach or maintain a healthy weight. Every person with diabetes is different, and each person has different needs for a meal plan. Your health care provider may recommend that you work with a diet and nutrition specialist (dietitian) to make a meal plan that is best for you. Your meal plan may vary depending on factors such as:  The calories you need.  The medicines you take.  Your weight.  Your blood glucose, blood pressure, and cholesterol levels.  Your activity level.  Other health conditions you have, such as heart or kidney disease. How do carbohydrates affect me? Carbohydrates, also called carbs, affect your blood glucose level more than any other type of food. Eating carbs naturally raises the amount of glucose in your blood. Carb counting is a method for keeping track of how many carbs you eat. Counting carbs is  important to keep your blood glucose at a healthy level, especially if you use insulin or take certain oral diabetes medicines. It is important to know how many carbs you can safely have in each meal. This is different for every person. Your dietitian can help you calculate how many carbs you should have at each meal and for each snack. Foods that contain carbs include:  Bread, cereal, rice, pasta, and crackers.  Potatoes and corn.  Peas, beans, and lentils.  Milk and yogurt.  Fruit and juice.  Desserts, such as cakes, cookies, ice cream, and candy. How does alcohol affect me? Alcohol can cause a sudden decrease in blood glucose (hypoglycemia), especially if you use insulin or take certain oral diabetes medicines. Hypoglycemia can be a life-threatening condition. Symptoms of hypoglycemia (sleepiness, dizziness, and confusion) are similar to symptoms of having too much alcohol. If your health care provider says that alcohol is safe for you, follow these guidelines:  Limit alcohol intake to no more than 1 drink per day for nonpregnant women and 2 drinks per day for men. One drink equals 12 oz of beer, 5 oz of wine, or 1 oz of hard liquor.  Do not drink on an empty stomach.  Keep yourself hydrated with water, diet soda, or unsweetened iced tea.  Keep in mind that regular soda, juice, and other mixers may contain a lot  of sugar and must be counted as carbs. What are tips for following this plan?  Reading food labels  Start by checking the serving size on the "Nutrition Facts" label of packaged foods and drinks. The amount of calories, carbs, fats, and other nutrients listed on the label is based on one serving of the item. Many items contain more than one serving per package.  Check the total grams (g) of carbs in one serving. You can calculate the number of servings of carbs in one serving by dividing the total carbs by 15. For example, if a food has 30 g of total carbs, it would be  equal to 2 servings of carbs.  Check the number of grams (g) of saturated and trans fats in one serving. Choose foods that have low or no amount of these fats.  Check the number of milligrams (mg) of salt (sodium) in one serving. Most people should limit total sodium intake to less than 2,300 mg per day.  Always check the nutrition information of foods labeled as "low-fat" or "nonfat". These foods may be higher in added sugar or refined carbs and should be avoided.  Talk to your dietitian to identify your daily goals for nutrients listed on the label. Shopping  Avoid buying canned, premade, or processed foods. These foods tend to be high in fat, sodium, and added sugar.  Shop around the outside edge of the grocery store. This includes fresh fruits and vegetables, bulk grains, fresh meats, and fresh dairy. Cooking  Use low-heat cooking methods, such as baking, instead of high-heat cooking methods like deep frying.  Cook using healthy oils, such as olive, canola, or sunflower oil.  Avoid cooking with butter, cream, or high-fat meats. Meal planning  Eat meals and snacks regularly, preferably at the same times every day. Avoid going long periods of time without eating.  Eat foods high in fiber, such as fresh fruits, vegetables, beans, and whole grains. Talk to your dietitian about how many servings of carbs you can eat at each meal.  Eat 4-6 ounces (oz) of lean protein each day, such as lean meat, chicken, fish, eggs, or tofu. One oz of lean protein is equal to: ? 1 oz of meat, chicken, or fish. ? 1 egg. ?  cup of tofu.  Eat some foods each day that contain healthy fats, such as avocado, nuts, seeds, and fish. Lifestyle  Check your blood glucose regularly.  Exercise regularly as told by your health care provider. This may include: ? 150 minutes of moderate-intensity or vigorous-intensity exercise each week. This could be brisk walking, biking, or water aerobics. ? Stretching and  doing strength exercises, such as yoga or weightlifting, at least 2 times a week.  Take medicines as told by your health care provider.  Do not use any products that contain nicotine or tobacco, such as cigarettes and e-cigarettes. If you need help quitting, ask your health care provider.  Work with a Veterinary surgeon or diabetes educator to identify strategies to manage stress and any emotional and social challenges. Questions to ask a health care provider  Do I need to meet with a diabetes educator?  Do I need to meet with a dietitian?  What number can I call if I have questions?  When are the best times to check my blood glucose? Where to find more information:  American Diabetes Association: diabetes.org  Academy of Nutrition and Dietetics: www.eatright.[employer] of Diabetes and Digestive and Kidney Diseases (NIH): CarFlippers.tn Summary  A healthy meal plan will help you control your blood glucose and maintain a healthy lifestyle.  Working with a diet and nutrition specialist (dietitian) can help you make a meal plan that is best for you.  Keep in mind that carbohydrates (carbs) and alcohol have immediate effects on your blood glucose levels. It is important to count carbs and to use alcohol carefully. This information is not intended to replace advice given to you by your health care provider. Make sure you discuss any questions you have with your health care provider. Document Revised: 11/23/2017 Document Reviewed: 01/15/2017 Elsevier Patient Education  2020 [employer].

## 2020-08-26 ENCOUNTER — Telehealth: Payer: Medicare Other

## 2020-09-09 ENCOUNTER — Telehealth: Payer: Self-pay

## 2020-09-09 NOTE — Chronic Care Management (AMB) (Signed)
Chronic Care Management Pharmacy Assistant   Name: Sherri [name]  MRN: [id number] DOB: 1966/07/10  Reason for Encounter: Disease State / Diabetes   Patient Questions:  1.  Have you seen any other providers since your last visit? Yes, 07/27/2020 [name], NP - [facility]  2.  Any changes in your medicines or health? No    PCP : Arnette Felts, FNP  Allergies:   Allergies  Allergen Reactions  . Ibuprofen Other (See Comments)    Does not take due to hx of renal insufficiency "I have kidney disease"   . Lemon Flavor Swelling    Severe Lip Swelling  FRUIT per pt.    . Amoxicillin Diarrhea and Other (See Comments)  . Tylenol [Acetaminophen] Hives    Cannot take large quantities    Medications: Outpatient Encounter Medications as of 09/09/2020  Medication Sig Note  . albuterol (PROAIR HFA) 108 (90 Base) MCG/ACT inhaler Inhale into the lungs every 6 (six) hours as needed for wheezing or shortness of breath.   . Ascorbic Acid (VITAMIN C) 1000 MG tablet Take 1,000 mg by mouth daily.   . baclofen (LIORESAL) 20 MG tablet TAKE 1 AND 1/2 TABLETS BY  MOUTH IN THE MORNING AND IN THE EVENING AND 1 TABLET AT MIDDAY   . cetirizine (ZYRTEC) 10 MG tablet Take 1 tablet (10 mg total) by mouth daily.   . Cholecalciferol (VITAMIN D3) 10 MCG (400 UNIT) tablet Take 1 each by mouth daily.    . Cranberry-Vitamin C-Vitamin E (CRANBERRY PLUS VITAMIN C) 4200-20-3 MG-MG-UNIT CAPS Take by mouth. (Patient not taking: Reported on 07/26/2020)   . cyanocobalamin (,VITAMIN B-12,) 1000 MCG/ML injection Inject 1,000 mcg into the muscle every 3 (three) months.    . desloratadine (CLARINEX) 5 MG tablet TAKE 1 TABLET BY MOUTH  DAILY   . diazepam (VALIUM) 10 MG tablet TAKE 2 TABLETS BY MOUTH 3  TIMES DAILY   . diphenhydrAMINE (BENADRYL) 25 MG tablet Take 25 mg by mouth at bedtime as needed for itching or allergies.  (Patient not taking: Reported on 07/27/2020) 01/17/2017: Takes as needed  . EPINEPHRINE 0.3  mg/0.3 mL IJ SOAJ injection INJECT INTRAMUSCULARLY  0.3ML AS NEEDED FOR  ANAPHYLAXIS . SEEK MEDICAL  ATTENTION AFTER USE.   . famotidine (PEPCID) 20 MG tablet TAKE 1 TABLET BY MOUTH TWICE A DAY   . FLUoxetine (PROZAC) 10 MG capsule TAKE 1 CAPSULE BY MOUTH  DAILY   . gabapentin (NEURONTIN) 300 MG capsule TAKE 1 CAPSULE BY MOUTH TWO TIMES DAILY (Patient taking differently: Take 300 mg by mouth 2 (two) times daily. TAKE 1 CAPSULE BY MOUTH TWO TIMES DAILY)   . glipiZIDE (GLUCOTROL) 5 MG tablet TAKE 1 TABLET BY MOUTH EVERY DAY BEFORE MEALS   . Glucose Blood (ACCU-CHEK SIMPLICITY TEST STRP VI) by In Vitro route. Insert 1 by subcutaneous route 4 times every day check blood sugar before breakfast, lunch and dinner and bedtime   . hydrOXYzine (VISTARIL) 100 MG capsule TAKE 1 CAPSULE BY MOUTH 3  TIMES DAILY AS NEEDED FOR  ITCHING   . metFORMIN (GLUCOPHAGE) 500 MG tablet TAKE 2 TABLETS (1,000 MG TOTAL) BY MOUTH 2 (TWO) TIMES DAILY WITH A MEAL.   [name] metroNIDAZOLE (FLAGYL) 500 MG tablet Take 1 tablet (500 mg total) by mouth 2 (two) times daily. (Patient not taking: Reported on 07/27/2020)   . Olopatadine HCl (PAZEO) 0.7 % SOLN Apply 1 drop to eye as needed.    . vitamin E (VITAMIN E)  400 UNIT capsule Take 400 Units by mouth daily. (Patient not taking: Reported on 07/26/2020)    No facility-administered encounter medications on file as of 09/09/2020.    Current Diagnosis: Patient Active Problem List   Diagnosis Date Noted  . Chronic rhinitis 04/24/2019  . Keloid 10/10/2018  . Diabetes type 2, controlled (HCC) 10/10/2018  . Depression 01/02/2017  . History of DVT (deep vein thrombosis) 01/02/2017  . Chronic pain syndrome 01/02/2017  . Cocaine abuse (HCC) 01/02/2017  . Dysphagia   . Adjustment insomnia   . Distal radius fracture, right 08/16/2016  . Postconcussion syndrome 12/30/2014  . Tremor 03/11/2014  . 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 Hiatal hernia 03/21/2013  . Anemia, iron deficiency 01/29/2013    . Spasm of muscle 09/02/2012  . Anemia 08/28/2012  . Mild intermittent asthma 08/27/2012  . Chronic back pain   . Pernicious anemia 10/30/2011  . DEGENERATIVE DISC DISEASE 08/18/2010  . Asthma with bronchitis 07/28/2010  . Recurrent urticaria 07/28/2010    Recent Relevant Labs: Lab Results  Component Value Date/Time   HGBA1C 6.4 (H) 06/03/2020 05:00 PM   HGBA1C 8.5 (H) 01/15/2020 12:12 PM   MICROALBUR 30 06/04/2020 12:29 PM    Kidney Function Lab Results  Component Value Date/Time   CREATININE 1.16 (H) 07/27/2020 01:16 PM   CREATININE 1.00 06/03/2020 05:00 PM   CREATININE 1.26 (H) 03/10/2020 10:14 AM   CREATININE 1.0 11/23/2017 11:47 AM   CREATININE 1.0 07/23/2017 11:30 AM   CREATININE 1.0 03/22/2017 09:43 AM   CREATININE 1.2 (H) 08/24/2016 10:04 AM   GFRNONAA 53 (L) 07/27/2020 01:16 PM   GFRAA >60 07/27/2020 01:16 PM    . Current antihyperglycemic regimen:  [name] Glipizide 5mg  daily  . Metformin 500mg  2 tablets twice daily with a meal  . What recent interventions/DTPs have been made to improve glycemic control:  o Patient has been exercising, states she has been doing squats and walking more regularly,and her diet has improved. . Have there been any recent hospitalizations or ED visits since last visit with CPP? No . Patient denies hypoglycemic symptoms, including Pale, Sweaty, Shaky, Hungry, Nervous/irritable and Vision changes . Patient denies hyperglycemic symptoms, including blurry vision, excessive thirst, fatigue, polyuria and weakness . How often are you checking your blood sugar? Patient is not at  goal with checking her glucose levels , states she only checks once or twice every two weeks . What are your blood sugars ranging?  o Fasting: 09/12/2020 - 213 o Before meals: none o After meals: none o Bedtime: none . During the week, how often does your blood glucose drop below 70? Never   . Are you checking your feet daily/regularly? Yes , Patient states she has no  problems with her feet, no sores, swelling and /or  pain.  Adherence Review: Is the patient currently on a STATIN medication?  No Is the patient currently on ACE/ARB medication? No Does the patient have >5 day gap between last estimated fill dates? No   Goals Addressed            This Visit's Progress   . Pharmacy Care Plan   Not on track    CARE PLAN ENTRY (see longitudinal plan of care for additional care plan information)  Current Barriers:  . Chronic Disease Management support, education, and care coordination needs related to Diabetes, Asthma, and Stiff Person Syndrome   Diabetes Lab Results  Component Value Date/Time   HGBA1C 6.4 (H) 06/03/2020 05:00 PM  HGBA1C 8.5 (H) 01/15/2020 12:12 PM   . Pharmacist Clinical Goal(s): o Over the next 180 days, patient will work with PharmD and providers to maintain A1c goal <7% . Current regimen:  [name] Glipizide 5mg  daily  . Metformin 500mg  2 tablets twice daily with a meal . Interventions: o Provided dietary and exercise recommendations . 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 Current regimen:   Baclofen 20mg  1.5 tablets in the morning and evening and 1 tablet midday  Diazepam 10mg  2 tablets three times daily  Gabapentin 300mg  2 capsules twice daily . Interventions: o Provided patient education regarding onset of action of medications and that sleeping does not help to wear off medications . Patient self care activities - Over the next 90 days, patient will: o Take medications daily as  directed o Monitor for signs and symptoms of CNS depression (drowsiness, respiratory depression , etc)  Asthma/Allergies . Pharmacist Clinical Goal(s) o Over the next 180 days, patient will work with PharmD and providers to control symptoms of asthma and allergies . Current regimen:   Cetirizine 10mg  daily  Desloratadine 5mg  daily  Diphenhydramine 25mg  at bedtime as needed for itching or allergies  Hydroxyzine pamoate 100mg  three times daily as needed for itching  Olopatadine 0.7% 1 drop to eye as needed  Epipen as needed for anaphylaxis  Ventolin HFA every 6 hours as needed for wheezing or SOB . Interventions: o Discussed proper storage of Epipen and expiration date o Provided patient education regarding same active ingredients present in Proair and Ventolin . Patient self care activities - Over the next 180 days, patient will: o Use medication daily as directed o Avoid triggers if possible  Medication management . Pharmacist Clinical Goal(s): o Over the next 180 days, patient will work with PharmD and providers to maintain optimal medication adherence . Current pharmacy: [facility] . Interventions o Comprehensive medication review performed. o Continue current medication management strategy . Patient self care activities - Over the next 180 days, patient will: o Focus on medication adherence by utilizing a pill box o Take medications as prescribed o Report any questions or concerns to PharmD and/or provider(s)  Initial goal documentation        Follow-Up:  Pharmacist Review Patient is not checking her glucose levels as directed. Patient admits to forgetting to take her diabetes medications a few times in last week, states her diet has improved some, and she is still working on a more healthier diet. Patient would like to know if she might be eligible for a Dexcom machine.  , CPP notified.  , [facility] Clinical Pharmacist Assistant [phone]

## 2020-09-16 ENCOUNTER — Telehealth: Payer: Medicare Other

## 2020-09-16 ENCOUNTER — Telehealth: Payer: Self-pay

## 2020-09-16 NOTE — Telephone Encounter (Signed)
  Chronic Care Management   Outreach Note  09/16/2020 Name: Sherri [name] MRN: [id number] DOB: 03-Oct-1966  Referred by: Arnette Felts, FNP Reason for referral : Care Coordination   An unsuccessful telephone outreach was attempted today. The patient was referred to the case management team for assistance with care management and care coordination.   Follow Up Plan: The care management team will reach out to the patient again over the next 10 days.   [name], BSW, CDP Social Worker, Certified Dementia Practitioner [facility] Care Management [phone]

## 2020-09-21 ENCOUNTER — Other Ambulatory Visit (HOSPITAL_COMMUNITY)
Admission: RE | Admit: 2020-09-21 | Discharge: 2020-09-21 | Disposition: A | Discharge status: Home / Self Care | Payer: Medicare Other | Source: Ambulatory Visit | Attending: Obstetrics | Admitting: Obstetrics

## 2020-09-21 ENCOUNTER — Other Ambulatory Visit: Payer: Self-pay

## 2020-09-21 ENCOUNTER — Ambulatory Visit: Payer: Medicare Other

## 2020-09-21 ENCOUNTER — Encounter: Payer: Self-pay | Admitting: Obstetrics

## 2020-09-21 ENCOUNTER — Other Ambulatory Visit: Payer: Self-pay | Admitting: Nurse Practitioner

## 2020-09-21 ENCOUNTER — Ambulatory Visit (INDEPENDENT_AMBULATORY_CARE_PROVIDER_SITE_OTHER): Payer: Medicare Other | Admitting: Obstetrics

## 2020-09-21 VITALS — BP 90/66 | HR 92 | Wt 93.0 lb

## 2020-09-21 DIAGNOSIS — B9689 Other specified bacterial agents as the cause of diseases classified elsewhere: Secondary | ICD-10-CM

## 2020-09-21 DIAGNOSIS — N760 Acute vaginitis: Secondary | ICD-10-CM | POA: Diagnosis not present

## 2020-09-21 DIAGNOSIS — E119 Type 2 diabetes mellitus without complications: Secondary | ICD-10-CM

## 2020-09-21 DIAGNOSIS — N898 Other specified noninflammatory disorders of vagina: Secondary | ICD-10-CM | POA: Diagnosis not present

## 2020-09-21 DIAGNOSIS — R21 Rash and other nonspecific skin eruption: Secondary | ICD-10-CM

## 2020-09-21 DIAGNOSIS — Z8709 Personal history of other diseases of the respiratory system: Secondary | ICD-10-CM

## 2020-09-21 DIAGNOSIS — J45909 Unspecified asthma, uncomplicated: Secondary | ICD-10-CM

## 2020-09-21 MED ORDER — METRONIDAZOLE 500 MG PO TABS: 500.0000 mg | ORAL_TABLET | Freq: Two times a day (BID) | ORAL | 2 refills | Status: DC

## 2020-09-21 NOTE — Progress Notes (Signed)
Patient ID: Sherri [name], female   DOB: Mar 21, 1966, 54 y.o.   MRN: [id number]  Chief Complaint  Patient presents with  . Vaginitis    vaginitis and abnormal d/c    HPI Sherri [name] is a 54 y.o. female.  Complains of vaginal discharge. HPI  Past Medical History:  Diagnosis Date  . Angioedema   . Anxiety   . Arthritis   . Asthma   . Benzodiazepine withdrawal (HCC) 08/30/2012  . CAP (community acquired pneumonia) 08/29/2012  . Chronic back pain   . Chronic kidney disease   . Closed nondisplaced fracture of proximal phalanx of right little finger 10/18/2018  . Cocaine abuse (HCC)   . Depression   . Diarrhea   . DVT (deep venous thrombosis) (HCC)   . Environmental allergies   . Fall   . GERD (gastroesophageal reflux disease)   . Heart murmur    has been told once that she has a heart murmur, but has never had any problems  . Hiatal hernia 03/21/2013  . Lumbar herniated disc   . Peripheral vascular disease (HCC)   . Pernicious anemia 10/30/2011  . Postconcussion syndrome 12/30/2014  . Rhabdomyolysis 08/27/2012  . Seasonal allergies   . Stiff person syndrome   . Urticaria     Past Surgical History:  Procedure Laterality Date  . BUNIONECTOMY Left   . colonoscopy  07/09/15  . inguinal hernia 1983  1983  . OPEN REDUCTION INTERNAL FIXATION (ORIF) DISTAL RADIAL FRACTURE Right 08/16/2016   Procedure: OPEN REDUCTION INTERNAL FIXATION (ORIF) DISTAL RADIAL FRACTURE;  Surgeon: Eldred Manges, MD;  Location: [facility] OR;  Service: Orthopedics;  Laterality: Right;    Family History  Problem Relation Age of Onset  . Cancer Mother   . Other Mother   . COPD Father   . Asthma Brother   . Cancer Brother        colon  . Heart attack Brother   . Seizures Brother   . Allergic rhinitis Neg Hx   . Angioedema Neg Hx   . Eczema Neg Hx   . Immunodeficiency Neg Hx   . Urticaria Neg Hx     Social History Social History   Tobacco Use  . Smoking status: Never Smoker  . Smokeless tobacco: Never  Used  . Tobacco comment: Never Used Tobacco  Vaping Use  . Vaping Use: Never used  Substance Use Topics  . Alcohol use: Not Currently    Alcohol/week: 0.0 standard drinks    Comment: Occasional  . Drug use: Not Currently    Types: Cocaine    Allergies  Allergen Reactions  . Ibuprofen Other (See Comments)    Does not take due to hx of renal insufficiency "I have kidney disease"   . Lemon Flavor Swelling    Severe Lip Swelling  FRUIT per pt.    . Amoxicillin Diarrhea and Other (See Comments)  . Tylenol [Acetaminophen] Hives    Cannot take large quantities    Current Outpatient Medications  Medication Sig Dispense Refill  . albuterol (PROAIR HFA) 108 (90 Base) MCG/ACT inhaler Inhale into the lungs every 6 (six) hours as needed for wheezing or shortness of breath.    . Ascorbic Acid (VITAMIN C) 1000 MG tablet Take 1,000 mg by mouth daily.    . baclofen (LIORESAL) 20 MG tablet TAKE 1 AND 1/2 TABLETS BY  MOUTH IN THE MORNING AND IN THE EVENING AND 1 TABLET AT MIDDAY 360 tablet 3  . cetirizine (ZYRTEC) 10  MG tablet Take 1 tablet (10 mg total) by mouth daily. 90 tablet 2  . Cholecalciferol (VITAMIN D3) 10 MCG (400 UNIT) tablet Take 1 each by mouth daily.     . Cranberry-Vitamin C-Vitamin E (CRANBERRY PLUS VITAMIN C) 4200-20-3 MG-MG-UNIT CAPS Take by mouth. (Patient not taking: Reported on 07/26/2020)    . cyanocobalamin (,VITAMIN B-12,) 1000 MCG/ML injection Inject 1,000 mcg into the muscle every 3 (three) months.     . desloratadine (CLARINEX) 5 MG tablet TAKE 1 TABLET BY MOUTH  DAILY 90 tablet 0  . diazepam (VALIUM) 10 MG tablet TAKE 2 TABLETS BY MOUTH 3  TIMES DAILY 180 tablet 4  . diphenhydrAMINE (BENADRYL) 25 MG tablet Take 25 mg by mouth at bedtime as needed for itching or allergies.  (Patient not taking: Reported on 07/27/2020)    . EPINEPHRINE 0.3 mg/0.3 mL IJ SOAJ injection INJECT INTRAMUSCULARLY  0.3ML AS NEEDED FOR  ANAPHYLAXIS . SEEK MEDICAL  ATTENTION AFTER USE. 2 each 0  .  famotidine (PEPCID) 20 MG tablet TAKE 1 TABLET BY MOUTH TWICE A DAY 180 tablet 1  . FLUoxetine (PROZAC) 10 MG capsule TAKE 1 CAPSULE BY MOUTH  DAILY 90 capsule 3  . gabapentin (NEURONTIN) 300 MG capsule TAKE 1 CAPSULE BY MOUTH TWO TIMES DAILY (Patient taking differently: Take 300 mg by mouth 2 (two) times daily. TAKE 1 CAPSULE BY MOUTH TWO TIMES DAILY) 180 capsule 3  . glipiZIDE (GLUCOTROL) 5 MG tablet TAKE 1 TABLET BY MOUTH EVERY DAY BEFORE MEALS 90 tablet 1  . Glucose Blood (ACCU-CHEK SIMPLICITY TEST STRP VI) by In Vitro route. Insert 1 by subcutaneous route 4 times every day check blood sugar before breakfast, lunch and dinner and bedtime    . hydrOXYzine (VISTARIL) 100 MG capsule TAKE 1 CAPSULE BY MOUTH 3  TIMES DAILY AS NEEDED FOR  ITCHING 90 capsule 1  . metFORMIN (GLUCOPHAGE) 500 MG tablet TAKE 2 TABLETS (1,000 MG TOTAL) BY MOUTH 2 (TWO) TIMES DAILY WITH A MEAL. 360 tablet 1  . metroNIDAZOLE (FLAGYL) 500 MG tablet Take 1 tablet (500 mg total) by mouth 2 (two) times daily. 14 tablet 2  . Olopatadine HCl (PAZEO) 0.7 % SOLN Apply 1 drop to eye as needed.     . vitamin E (VITAMIN E) 400 UNIT capsule Take 400 Units by mouth daily. (Patient not taking: Reported on 07/26/2020)     No current facility-administered medications for this visit.    Review of Systems Review of Systems Constitutional: negative for fatigue and weight loss Respiratory: negative for cough and wheezing Cardiovascular: negative for chest pain, fatigue and palpitations Gastrointestinal: negative for abdominal pain and change in bowel habits Genitourinary: positive for vaginal discharge Integument/breast: negative for nipple discharge Musculoskeletal:negative for myalgias Neurological: negative for gait problems and tremors Behavioral/Psych: negative for abusive relationship, depression Endocrine: negative for temperature intolerance      Blood pressure 90/66, pulse 92, weight 93 lb (42.2 kg), last menstrual period  07/29/2012.  Physical Exam Physical Exam           General: Alert and no distress Abdomen:  normal findings: no organomegaly, soft, non-tender and no hernia  Pelvis:  External genitalia: normal general appearance Urinary system: urethral meatus normal and bladder without fullness, nontender Vaginal: normal without tenderness, induration or masses Cervix: normal appearance Adnexa: normal bimanual exam Uterus: anteverted and non-tender, normal size    50% of 15 min visit spent on counseling and coordination of care.   Data Reviewed Wet Prep  Assessment     1. Vaginal discharge Rx: - Cervicovaginal ancillary only( [place])  2. BV (bacterial vaginosis) Rx: - metroNIDAZOLE (FLAGYL) 500 MG tablet; Take 1 tablet (500 mg total) by mouth 2 (two) times daily.  Dispense: 14 tablet; Refill: 2    Plan   Follow up prn   Meds ordered this encounter  Medications  . metroNIDAZOLE (FLAGYL) 500 MG tablet    Sig: Take 1 tablet (500 mg total) by mouth 2 (two) times daily.    Dispense:  14 tablet    Refill:  2     Brock Bad, MD 09/21/2020 3:22 PM

## 2020-09-22 ENCOUNTER — Telehealth: Payer: Medicare Other

## 2020-09-22 ENCOUNTER — Other Ambulatory Visit: Payer: Self-pay | Admitting: Obstetrics

## 2020-09-22 LAB — CERVICOVAGINAL ANCILLARY ONLY
Bacterial Vaginitis (gardnerella): POSITIVE — AB
Candida Glabrata: NEGATIVE
Candida Vaginitis: NEGATIVE
Chlamydia: NEGATIVE
Comment: NEGATIVE
Comment: NEGATIVE
Comment: NEGATIVE
Comment: NEGATIVE
Comment: NEGATIVE
Comment: NORMAL
Neisseria Gonorrhea: NEGATIVE
Trichomonas: POSITIVE — AB

## 2020-09-22 NOTE — Patient Instructions (Signed)
Social Worker Visit Information  Goals we discussed today:  Goals Addressed            This Visit's Progress    "I need food resources"       CARE PLAN ENTRY (see longitudinal plan of care for additional care plan information)  Current Barriers:   Financial constraints related to ability to afford Glucerna  Limited access to food  Ongoing weight loss o 06/03/20 102 lb.  o 09/21/20  93 lb.  Chronic conditions including DM II and Asthma which put patient at increased risk of hospitalization  Social Work Clinical Goal(s):   Over the next 60 days, patient will work with SW to address concerns related to food insecurity  CCM SW Interventions: Completed 09/21/20  Inter-disciplinary care team collaboration (see longitudinal plan of care)  Successful outbound call placed to the patient to assist with care coordination needs  Determined the patient is struggling with food insecurity at this time o The patient reports she obtains canned goods from a [employer] and has some items on hand o The patient received 4 prepackaged meals from a family member to assist with food insecurity  Discussed the patient is interested in applying for assistance through [employer] o The patient is having difficulty accessing online application; SW encouraged the patient to call DSS directly to request assistance with application o The patient had assistance in the past but lost benefits approximately 1 year ago due to being over the income limit. The patients income has not changed but she is interested in applying again  Assessed for patient interest in a list of alternative food pantry sites- patient declined  Discussed opportunity to place patient referral to food resources via [id number] platform; patient agreed o Referral placed to [employer]  Discussed the patient is interested in obtaining Glucerna and reports this helps to keep her full when she  does not eat meals consistently o Advised the patient SW does not have samples on hand at this time  o Offered patient coupons; patient declined stating she still has some SW previously provided  o Patient plans to ask her sister if she is able to purchase Glucerna for her as the patient can not afford it at this time  Collaboration with Medical illustrator to inform of ongoing food insecurity, intervention and plan  Scheduled follow up to the patient over the next month  Patient Self Care Activities:   Patient verbalizes understanding of plan to work with SW to identify nutrition resources  Attends all scheduled provider appointments  Calls pharmacy for medication refills  Performs ADL's independently  Performs IADL's independently  Initial goal documentation         Follow Up Plan: SW will follow up with patient by phone over the next month.   [name], BSW, CDP Social Worker, Certified Dementia Practitioner [facility] Care Management [phone]

## 2020-09-22 NOTE — Chronic Care Management (AMB) (Signed)
Chronic Care Management    Social Work Follow Up Note  09/21/2020 Name: Sherri [name] MRN: [id number] DOB: 1966-06-22  Sherri [name] is a 54 y.o. year old female who is a primary care patient of Arnette Felts, FNP. The CCM team was consulted for assistance with care coordination.   Review of patient status, including review of consultants reports, other relevant assessments, and collaboration with appropriate care team members and the patient's provider was performed as part of comprehensive patient evaluation and provision of chronic care management services.    SDOH (Social Determinants of Health) assessments performed: Yes SDOH Interventions     Most Recent Value  SDOH Interventions  Food Insecurity Interventions [id number] Referral  [[employer]]  Housing Interventions Intervention Not Indicated       Outpatient Encounter Medications as of 09/21/2020  Medication Sig Note  . Ascorbic Acid (VITAMIN C) 1000 MG tablet Take 1,000 mg by mouth daily.   . baclofen (LIORESAL) 20 MG tablet TAKE 1 AND 1/2 TABLETS BY  MOUTH IN THE MORNING AND IN THE EVENING AND 1 TABLET AT MIDDAY   . cetirizine (ZYRTEC) 10 MG tablet Take 1 tablet (10 mg total) by mouth daily.   . Cholecalciferol (VITAMIN D3) 10 MCG (400 UNIT) tablet Take 1 each by mouth daily.    . Cranberry-Vitamin C-Vitamin E (CRANBERRY PLUS VITAMIN C) 4200-20-3 MG-MG-UNIT CAPS Take by mouth. (Patient not taking: Reported on 07/26/2020)   . cyanocobalamin (,VITAMIN B-12,) 1000 MCG/ML injection Inject 1,000 mcg into the muscle every 3 (three) months.    . desloratadine (CLARINEX) 5 MG tablet TAKE 1 TABLET BY MOUTH  DAILY   . diazepam (VALIUM) 10 MG tablet TAKE 2 TABLETS BY MOUTH 3  TIMES DAILY   . diphenhydrAMINE (BENADRYL) 25 MG tablet Take 25 mg by mouth at bedtime as needed for itching or allergies.  (Patient not taking: Reported on 07/27/2020) 01/17/2017: Takes as needed  . EPINEPHRINE 0.3 mg/0.3 mL IJ SOAJ injection INJECT  INTRAMUSCULARLY  0.3ML AS NEEDED FOR  ANAPHYLAXIS . SEEK MEDICAL  ATTENTION AFTER USE.   . famotidine (PEPCID) 20 MG tablet TAKE 1 TABLET BY MOUTH TWICE A DAY   . FLUoxetine (PROZAC) 10 MG capsule TAKE 1 CAPSULE BY MOUTH  DAILY   . gabapentin (NEURONTIN) 300 MG capsule TAKE 1 CAPSULE BY MOUTH TWO TIMES DAILY (Patient taking differently: Take 300 mg by mouth 2 (two) times daily. TAKE 1 CAPSULE BY MOUTH TWO TIMES DAILY)   . glipiZIDE (GLUCOTROL) 5 MG tablet TAKE 1 TABLET BY MOUTH EVERY DAY BEFORE MEALS   . Glucose Blood (ACCU-CHEK SIMPLICITY TEST STRP VI) by In Vitro route. Insert 1 by subcutaneous route 4 times every day check blood sugar before breakfast, lunch and dinner and bedtime   . metFORMIN (GLUCOPHAGE) 500 MG tablet TAKE 2 TABLETS (1,000 MG TOTAL) BY MOUTH 2 (TWO) TIMES DAILY WITH A MEAL.   [name] metroNIDAZOLE (FLAGYL) 500 MG tablet Take 1 tablet (500 mg total) by mouth 2 (two) times daily.   . Olopatadine HCl (PAZEO) 0.7 % SOLN Apply 1 drop to eye as needed.    . vitamin E (VITAMIN E) 400 UNIT capsule Take 400 Units by mouth daily. (Patient not taking: Reported on 07/26/2020)   . [DISCONTINUED] albuterol (PROAIR HFA) 108 (90 Base) MCG/ACT inhaler Inhale into the lungs every 6 (six) hours as needed for wheezing or shortness of breath.   . [DISCONTINUED] hydrOXYzine (VISTARIL) 100 MG capsule TAKE 1 CAPSULE BY MOUTH 3  TIMES  DAILY AS NEEDED FOR  ITCHING    No facility-administered encounter medications on file as of 09/21/2020.     Goals Addressed            This Visit's Progress   . "I need food resources"       CARE PLAN ENTRY (see longitudinal plan of care for additional care plan information)  Current Barriers:  . Financial constraints related to ability to afford Glucerna . Limited access to food . Ongoing weight loss o 06/03/20 102 lb.  o 09/21/20  93 lb. . Chronic conditions including DM II and Asthma which put patient at increased risk of hospitalization  Social Work Clinical  Goal(s):  [name] Over the next 60 days, patient will work with SW to address concerns related to food insecurity  CCM SW Interventions: Completed 09/21/20 . Inter-disciplinary care team collaboration (see longitudinal plan of care) . Successful outbound call placed to the patient to assist with care coordination needs . Determined the patient is struggling with food insecurity at this time o The patient reports she obtains canned goods from a [employer] and has some items on hand o The patient received 4 prepackaged meals from a family member to assist with food insecurity . Discussed the patient is interested in applying for assistance through [employer] o The patient is having difficulty accessing online application; SW encouraged the patient to call DSS directly to request assistance with application o The patient had assistance in the past but lost benefits approximately 1 year ago due to being over the income limit. The patients income has not changed but she is interested in applying again . Assessed for patient interest in a list of alternative food pantry sites- patient declined . Discussed opportunity to place patient referral to food resources via [id number] platform; patient agreed o Referral placed to [employer] . Discussed the patient is interested in obtaining Glucerna and reports this helps to keep her full when she does not eat meals consistently o Advised the patient SW does not have samples on hand at this time  o Offered patient coupons; patient declined stating she still has some SW previously provided  o Patient plans to ask her sister if she is able to purchase Glucerna for her as the patient can not afford it at this time . Collaboration with RN Care Manager to inform of ongoing food insecurity, intervention and plan . Scheduled follow up to the patient over the next month  Patient Self Care Activities:  . Patient verbalizes  understanding of plan to work with SW to identify nutrition resources . Attends all scheduled provider appointments . Calls pharmacy for medication refills . Performs ADL's independently . Performs IADL's independently  Initial goal documentation         Follow Up Plan: SW will follow up with patient by phone over the next month.   [name], BSW, CDP Social Worker, Certified Dementia Practitioner [facility] Care Management [phone]  Total time spent performing care coordination and/or care management activities with the patient by phone or face to face = 35 minutes.

## 2020-09-30 ENCOUNTER — Telehealth: Payer: Medicare Other

## 2020-10-01 ENCOUNTER — Telehealth: Payer: Medicare Other

## 2020-10-01 ENCOUNTER — Ambulatory Visit: Payer: Self-pay

## 2020-10-01 ENCOUNTER — Other Ambulatory Visit: Payer: Self-pay

## 2020-10-01 DIAGNOSIS — J45909 Unspecified asthma, uncomplicated: Secondary | ICD-10-CM

## 2020-10-01 DIAGNOSIS — G2582 Stiff-man syndrome: Secondary | ICD-10-CM

## 2020-10-01 DIAGNOSIS — E119 Type 2 diabetes mellitus without complications: Secondary | ICD-10-CM

## 2020-10-02 ENCOUNTER — Other Ambulatory Visit: Payer: Self-pay

## 2020-10-02 ENCOUNTER — Ambulatory Visit (HOSPITAL_COMMUNITY)
Admission: RE | Admit: 2020-10-02 | Discharge: 2020-10-02 | Disposition: A | Discharge status: Home / Self Care | Payer: Medicare Other | Source: Ambulatory Visit | Attending: Family Medicine | Admitting: Family Medicine

## 2020-10-02 ENCOUNTER — Encounter (HOSPITAL_COMMUNITY): Payer: Self-pay

## 2020-10-02 VITALS — BP 113/69 | HR 114 | Temp 99.3°F | Resp 16 | Ht 63.0 in | Wt 93.0 lb

## 2020-10-02 DIAGNOSIS — Z886 Allergy status to analgesic agent status: Secondary | ICD-10-CM | POA: Diagnosis not present

## 2020-10-02 DIAGNOSIS — N189 Chronic kidney disease, unspecified: Secondary | ICD-10-CM | POA: Diagnosis not present

## 2020-10-02 DIAGNOSIS — Z86718 Personal history of other venous thrombosis and embolism: Secondary | ICD-10-CM | POA: Insufficient documentation

## 2020-10-02 DIAGNOSIS — Z880 Allergy status to penicillin: Secondary | ICD-10-CM | POA: Insufficient documentation

## 2020-10-02 DIAGNOSIS — K219 Gastro-esophageal reflux disease without esophagitis: Secondary | ICD-10-CM | POA: Insufficient documentation

## 2020-10-02 DIAGNOSIS — Z20822 Contact with and (suspected) exposure to covid-19: Secondary | ICD-10-CM | POA: Diagnosis not present

## 2020-10-02 DIAGNOSIS — J45909 Unspecified asthma, uncomplicated: Secondary | ICD-10-CM | POA: Insufficient documentation

## 2020-10-02 DIAGNOSIS — I739 Peripheral vascular disease, unspecified: Secondary | ICD-10-CM | POA: Insufficient documentation

## 2020-10-02 DIAGNOSIS — R059 Cough, unspecified: Secondary | ICD-10-CM | POA: Diagnosis not present

## 2020-10-02 NOTE — ED Provider Notes (Signed)
[facility]   [id number] 10/02/20 Arrival Time: 1257  ASSESSMENT & PLAN:  1. Cough      COVID-19 testing sent. See letter/work note on file for self-isolation guidelines. OTC symptom care as needed.     Follow-up Information    Arnette Felts, FNP.   Specialty: General Practice Why: As needed. Contact information: [address] [place] [address] [phone]        Arnette Felts, FNP.   Specialty: General Practice Why: As needed. Contact information: [address] [place] [address] [phone]               Reviewed expectations re: course of current medical issues. Questions answered. Outlined signs and symptoms indicating need for more acute intervention. Understanding verbalized. After Visit Summary given.   SUBJECTIVE: History from: patient. Sherri [name] is a 54 y.o. female who presents with worries regarding COVID-19. Known COVID-19 contact: none. Recent travel: none. Reports: cough for one day. Denies: runny nose, congestion, fever, sore throat, difficulty breathing and headache. Normal PO intake without n/v/d.    OBJECTIVE:  Vitals:   10/02/20 1340 10/02/20 1344  BP: 113/69   Pulse: (!) 114   Resp: 16   Temp: 99.3 F (37.4 C)   TempSrc: Oral   SpO2: 94%   Weight:  42.2 kg  Height:  5\' 3"  (1.6 m)    General appearance: alert; no distress Eyes: PERRLA; EOMI; conjunctiva normal HENT: [place]; AT; without nasal congestion Neck: supple  Lungs: speaks full sentences without difficulty; unlabored; occasional dry cough Extremities: no edema Skin: warm and dry Neurologic: normal gait Psychological: alert and cooperative; normal mood and affect  Labs:  Labs Reviewed  SARS CORONAVIRUS 2 (TAT 6-24 HRS)      Allergies  Allergen Reactions  . Ibuprofen Other (See Comments)    Does not take due to hx of renal insufficiency "I have kidney disease"   . Lemon Flavor Swelling    Severe Lip  Swelling  FRUIT per pt.    . Amoxicillin Diarrhea and Other (See Comments)  . Tylenol [Acetaminophen] Hives    Cannot take large quantities    Past Medical History:  Diagnosis Date  . Angioedema   . Anxiety   . Arthritis   . Asthma   . Benzodiazepine withdrawal (HCC) 08/30/2012  . CAP (community acquired pneumonia) 08/29/2012  . Chronic back pain   . Chronic kidney disease   . Closed nondisplaced fracture of proximal phalanx of right little finger 10/18/2018  . Cocaine abuse (HCC)   . Depression   . Diarrhea   . DVT (deep venous thrombosis) (HCC)   . Environmental allergies   . Fall   . GERD (gastroesophageal reflux disease)   . Heart murmur    has been told once that she has a heart murmur, but has never had any problems  . Hiatal hernia 03/21/2013  . Lumbar herniated disc   . Peripheral vascular disease (HCC)   . Pernicious anemia 10/30/2011  . Postconcussion syndrome 12/30/2014  . Rhabdomyolysis 08/27/2012  . Seasonal allergies   . Stiff person syndrome   . Urticaria    Social History   Socioeconomic History  . Marital status: Divorced    Spouse name: Not on file  . Number of children: 1  . Years of education: college  . Highest education level: Not on file  Occupational History  . Occupation: disabled  Tobacco Use  . Smoking status: Never Smoker  . Smokeless  tobacco: Never Used  . Tobacco comment: Never Used Tobacco  Vaping Use  . Vaping Use: Never used  Substance and Sexual Activity  . Alcohol use: Not Currently    Alcohol/week: 0.0 standard drinks    Comment: Occasional  . Drug use: Not Currently    Types: Cocaine  . Sexual activity: Yes    Partners: Male  Other Topics Concern  . Not on file  Social History Narrative   Patient is right and left handed.   Patient drinks some caffeine occasionally.   Social Determinants of Health   Financial Resource Strain: Low Risk   . Difficulty of Paying Living Expenses: Not very hard  Food Insecurity: Food  Insecurity Present  . Worried About Programme researcher, broadcasting/film/video in the Last Year: Often true  . Ran Out of Food in the Last Year: Often true  Transportation Needs: No Transportation Needs  . Lack of Transportation (Medical): No  . Lack of Transportation (Non-Medical): No  Physical Activity: Inactive  . Days of Exercise per Week: 0 days  . Minutes of Exercise per Session: 0 min  Stress: No Stress Concern Present  . Feeling of Stress : Not at all  Social Connections:   . Frequency of Communication with Friends and Family: Not on file  . Frequency of Social Gatherings with Friends and Family: Not on file  . Attends Religious Services: Not on file  . Active Member of Clubs or Organizations: Not on file  . Attends Banker Meetings: Not on file  . Marital Status: Not on file  Intimate Partner Violence:   . Fear of Current or Ex-Partner: Not on file  . Emotionally Abused: Not on file  . Physically Abused: Not on file  . Sexually Abused: Not on file   Family History  Problem Relation Age of Onset  . Cancer Mother   . Other Mother   . COPD Father   . Asthma Brother   . Cancer Brother        colon  . Heart attack Brother   . Seizures Brother   . Allergic rhinitis Neg Hx   . Angioedema Neg Hx   . Eczema Neg Hx   . Immunodeficiency Neg Hx   . Urticaria Neg Hx    Past Surgical History:  Procedure Laterality Date  . BUNIONECTOMY Left   . colonoscopy  07/09/15  . inguinal hernia 1983  1983  . OPEN REDUCTION INTERNAL FIXATION (ORIF) DISTAL RADIAL FRACTURE Right 08/16/2016   Procedure: OPEN REDUCTION INTERNAL FIXATION (ORIF) DISTAL RADIAL FRACTURE;  Surgeon: Eldred Manges, MD;  Location: [facility] OR;  Service: Orthopedics;  Laterality: Right;     Mardella Layman, MD 10/02/20 1426

## 2020-10-02 NOTE — ED Triage Notes (Signed)
Yesterday Pt developed a cough . Pt reports she has been around a friend that has been sneezing and coughing with out covering his mouth. Pt her to get checked for COVID.

## 2020-10-02 NOTE — Discharge Instructions (Signed)
You have been tested for COVID-19 today. °If your test returns positive, you will receive a phone call from [place] regarding your results. °Negative test results are not called. °Both positive and negative results area always visible on MyChart. °If you do not have a MyChart account, sign up instructions are provided in your discharge papers. °Please do not hesitate to contact us should you have questions or concerns. ° °

## 2020-10-03 LAB — SARS CORONAVIRUS 2 (TAT 6-24 HRS): SARS Coronavirus 2: NEGATIVE

## 2020-10-05 NOTE — Patient Instructions (Signed)
Visit Information  Goals Addressed      Patient Stated   .  "I have a Summer cold" (pt-stated)        CARE PLAN ENTRY (see longitudinal plan of care for additional care plan information)  Current Barriers:  [name] Knowledge Deficits related to evaluation and treatment of cough/congestion and fever . Chronic Disease Management support and education needs related to DMII, Stiff-Person Syndrome, Asthma with bronchitis  Nurse Case Manager Clinical Goal(s):  [name] Over the next 14 days, patient will work with the PCP to address needs related to evaluation and treatment of cough/congestion and fever  CCM RN CM Interventions:  10/01/20 call completed with patient  . Inter-disciplinary care team collaboration (see longitudinal plan of care) . Evaluation of current treatment plan related to cough/congestion and fever and patient's adherence to plan as established by provider . Determined patient developed cough, congestion and fever approximately 1 day ago with her fever measuring 101.0 degrees F . Determined patient has not received her COVID vaccine, she was diagnosed with COVID 19 in December 2020 for which she received the monoclonial antibody infusion  . Located several local COVID 19 testing sites for patient to obtain a COVID 19 test and provided patient with the contact information . Discussed patient plans to get tested today and will f/u with Arnette Felts, FNP next week  . Discussed plans with patient for ongoing care management follow up and provided patient with direct contact information for care management team  Patient Self Care Activities:  . Self administers medications as prescribed . Attends all scheduled provider appointments . Calls pharmacy for medication refills . Performs ADL's independently . Performs IADL's independently . Calls provider office for new concerns or questions  Initial goal documentation       Patient verbalizes understanding of instructions provided  today.   Telephone follow up appointment with care management team member scheduled for: 10/13/20  Delsa Sale, RN, BSN, CCM Care Management Coordinator [facility] Care Management/[facility]  Direct Phone: [phone]

## 2020-10-05 NOTE — Chronic Care Management (AMB) (Signed)
Chronic Care Management   Follow Up Note   10/01/2020 Name: Sherri [name] MRN: [id number] DOB: January 26, 1966  Referred by: Arnette Felts, FNP Reason for referral : Chronic Care Management (CCM RNCM FU Call)   Sherri [name] is a 54 y.o. year old female who is a primary care patient of Arnette Felts, FNP. The CCM team was consulted for assistance with chronic disease management and care coordination needs.    Review of patient status, including review of consultants reports, relevant laboratory and other test results, and collaboration with appropriate care team members and the patient's provider was performed as part of comprehensive patient evaluation and provision of chronic care management services.    SDOH (Social Determinants of Health) assessments performed: No See Care Plan activities for detailed interventions related to SDOH)    Placed outbound CCM RN CM follow up call to patient for a care plan update.   Outpatient Encounter Medications as of 10/01/2020  Medication Sig Note  . albuterol (VENTOLIN HFA) 108 (90 Base) MCG/ACT inhaler TAKE 2 PUFFS BY MOUTH EVERY 6 HOURS AS NEEDED FOR WHEEZE OR SHORTNESS OF BREATH   . Ascorbic Acid (VITAMIN C) 1000 MG tablet Take 1,000 mg by mouth daily.   . baclofen (LIORESAL) 20 MG tablet TAKE 1 AND 1/2 TABLETS BY  MOUTH IN THE MORNING AND IN THE EVENING AND 1 TABLET AT MIDDAY   . cetirizine (ZYRTEC) 10 MG tablet Take 1 tablet (10 mg total) by mouth daily.   . Cholecalciferol (VITAMIN D3) 10 MCG (400 UNIT) tablet Take 1 each by mouth daily.    . Cranberry-Vitamin C-Vitamin E (CRANBERRY PLUS VITAMIN C) 4200-20-3 MG-MG-UNIT CAPS Take by mouth. (Patient not taking: Reported on 07/26/2020)   . cyanocobalamin (,VITAMIN B-12,) 1000 MCG/ML injection Inject 1,000 mcg into the muscle every 3 (three) months.    . desloratadine (CLARINEX) 5 MG tablet TAKE 1 TABLET BY MOUTH  DAILY   . diazepam (VALIUM) 10 MG tablet TAKE 2 TABLETS BY MOUTH 3  TIMES DAILY   .  diphenhydrAMINE (BENADRYL) 25 MG tablet Take 25 mg by mouth at bedtime as needed for itching or allergies.  01/17/2017: Takes as needed  . EPINEPHRINE 0.3 mg/0.3 mL IJ SOAJ injection INJECT INTRAMUSCULARLY  0.3ML AS NEEDED FOR  ANAPHYLAXIS . SEEK MEDICAL  ATTENTION AFTER USE.   . famotidine (PEPCID) 20 MG tablet TAKE 1 TABLET BY MOUTH TWICE A DAY   . FLUoxetine (PROZAC) 10 MG capsule TAKE 1 CAPSULE BY MOUTH  DAILY   . gabapentin (NEURONTIN) 300 MG capsule TAKE 1 CAPSULE BY MOUTH TWO TIMES DAILY (Patient taking differently: Take 300 mg by mouth 2 (two) times daily. TAKE 1 CAPSULE BY MOUTH TWO TIMES DAILY)   . glipiZIDE (GLUCOTROL) 5 MG tablet TAKE 1 TABLET BY MOUTH EVERY DAY BEFORE MEALS   . Glucose Blood (ACCU-CHEK SIMPLICITY TEST STRP VI) by In Vitro route. Insert 1 by subcutaneous route 4 times every day check blood sugar before breakfast, lunch and dinner and bedtime   . hydrOXYzine (VISTARIL) 100 MG capsule TAKE 1 CAPSULE (100 MG TOTAL) BY MOUTH 3 (THREE) TIMES DAILY AS NEEDED FOR ITCHING.   . metFORMIN (GLUCOPHAGE) 500 MG tablet TAKE 2 TABLETS (1,000 MG TOTAL) BY MOUTH 2 (TWO) TIMES DAILY WITH A MEAL.   [name] metroNIDAZOLE (FLAGYL) 500 MG tablet Take 1 tablet (500 mg total) by mouth 2 (two) times daily.   . Olopatadine HCl (PAZEO) 0.7 % SOLN Apply 1 drop to eye as needed.    [name]  vitamin E (VITAMIN E) 400 UNIT capsule Take 400 Units by mouth daily.     No facility-administered encounter medications on file as of 10/01/2020.     Objective:  Lab Results  Component Value Date   HGBA1C 6.4 (H) 06/03/2020   HGBA1C 8.5 (H) 01/15/2020   HGBA1C 5.9 (H) 04/23/2019   Lab Results  Component Value Date   MICROALBUR 30 06/04/2020   LDLCALC 83 06/03/2020   CREATININE 1.16 (H) 07/27/2020   BP Readings from Last 3 Encounters:  10/02/20 113/69  09/21/20 90/66  07/27/20 100/70    Goals Addressed      Patient Stated   .  "I have a Summer cold" (pt-stated)        CARE PLAN ENTRY (see longitudinal  plan of care for additional care plan information)  Current Barriers:  [name] Knowledge Deficits related to evaluation and treatment of cough/congestion and fever . Chronic Disease Management support and education needs related to DMII, Stiff-Person Syndrome, Asthma with bronchitis  Nurse Case Manager Clinical Goal(s):  [name] Over the next 14 days, patient will work with the PCP to address needs related to evaluation and treatment of cough/congestion and fever  CCM RN CM Interventions:  10/01/20 call completed with patient  . Inter-disciplinary care team collaboration (see longitudinal plan of care) . Evaluation of current treatment plan related to cough/congestion and fever and patient's adherence to plan as established by provider . Determined patient developed cough, congestion and fever approximately 1 day ago with her fever measuring 101.0 degrees F . Determined patient has not received her COVID vaccine, she was diagnosed with COVID 19 in December 2020 for which she received the monoclonial antibody infusion  . Located several local COVID 19 testing sites for patient to obtain a COVID 19 test and provided patient with the contact information . Discussed patient plans to get tested today and will f/u with Arnette Felts, FNP next week  . Discussed plans with patient for ongoing care management follow up and provided patient with direct contact information for care management team  Patient Self Care Activities:  . Self administers medications as prescribed . Attends all scheduled provider appointments . Calls pharmacy for medication refills . Performs ADL's independently . Performs IADL's independently . Calls provider office for new concerns or questions  Initial goal documentation       Plan:   Telephone follow up appointment with care management team member scheduled for: 10/13/20  Delsa Sale, RN, BSN, CCM Care Management Coordinator [facility] Care Management/[facility]  Direct Phone: [phone]

## 2020-10-11 ENCOUNTER — Ambulatory Visit: Payer: Medicare Other

## 2020-10-11 DIAGNOSIS — R634 Abnormal weight loss: Secondary | ICD-10-CM

## 2020-10-11 DIAGNOSIS — E119 Type 2 diabetes mellitus without complications: Secondary | ICD-10-CM
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
## 2020-10-13 ENCOUNTER — Telehealth: Payer: Medicare Other

## 2020-10-13 ENCOUNTER — Other Ambulatory Visit: Payer: Self-pay

## 2020-10-13 ENCOUNTER — Ambulatory Visit: Payer: Self-pay

## 2020-10-13 DIAGNOSIS — J45909 Unspecified asthma, uncomplicated: Secondary | ICD-10-CM

## 2020-10-13 DIAGNOSIS — E119 Type 2 diabetes mellitus without complications: Secondary | ICD-10-CM

## 2020-10-13 DIAGNOSIS — G2582 Stiff-man syndrome: Secondary | ICD-10-CM

## 2020-10-15 NOTE — Patient Instructions (Signed)
Visit Information  Goals Addressed      Patient Stated   .  COMPLETED: "I have a Summer cold" (pt-stated)   On track     CARE PLAN ENTRY (see longitudinal plan of care for additional care plan information)  Current Barriers:  [name] Knowledge Deficits related to evaluation and treatment of cough/congestion and fever . 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 Inter-disciplinary care team collaboration (see longitudinal plan of care) . Evaluation of current treatment plan related to cough/congestion and fever and patient's adherence to plan as established by provider . Determined patient's symptoms have greatly improved, her COVID 19 test was negative . Determined patient continues to have a slight cough but states this is clearing and all symptoms have resolved  . Discussed plans with patient for ongoing care management follow up and provided patient with direct contact information for care management team  Patient Self Care Activities:  . Self administers medications as prescribed . Attends all scheduled provider appointments . Calls pharmacy for medication refills . Calls provider office for new concerns or questions  Please see past updates related to this goal by clicking on the "Past Updates" button in the selected goal      .  "To keep my Asthma under good control" (pt-stated)   On track     Current Barriers:  [name] Knowledge Deficits related to disease process and Self Health management of Asthma . 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 New 10/13/20 Over the  next 180 days, patient will work with the CCM team and PCP to address needs related to disease education and support of Asthma  CCM RN CM Interventions:  10/13/20 call completed with patient  . Evaluation of current treatment plan related to Asthma and patient's adherence to plan as established by provider. . Determined patient's Asthma is stable, patient is established with [facility] for management of this condition . Educated patient on disease process and treatment recommendations, Educated on ways to improve Self Health management of this condition  . Discussed plans with patient for ongoing care management follow up and provided patient with direct contact information for care management team  Patient Self Care Activities:  . Self administers medications as prescribed . Attends all scheduled provider appointments . Calls pharmacy for medication refills . Calls provider office for new concerns or questions  Please see past updates related to this goal by clicking on the "Past Updates" button in the selected goal      .  "To keep my diabetes under good control" (pt-stated)   On track     Current Barriers:  [name] Knowledge Deficits related to disease process and Self Health management of Asthma . 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 New 10/13/20 Over the next 180 days, patient will work with the CCM team and PCP to address needs related to disease education and support for Diabetes  CCM RN CM Interventions:  10/13/20 call completed with patient  . Evaluation of current treatment plan related to Diabetes and patient's  adherence to plan as established by provider. . Provided education to patient re: decrease in A1c to 6.4; Re-educated on daily glycemic control, FBS 80-130, <180 after meals; re-educated on dietary and exercise recommendations . Determined patient continues  to adhere to her prescribed DM treatment plan; Metformin 500 mg 2 tablets twice daily (1000 mg total) with a meal, Glipizide 5 mg daily with a meal  . Advised patient, providing education and rationale, to check cbg daily before meals and record, calling the CCM team and or PCP for findings outside established parameters  . Discussed plans with patient for ongoing care management follow up and provided patient with direct contact information for care management team  Patient Self Care Activities:  . Self administers medications as prescribed . Attends all scheduled provider appointments . Calls pharmacy for medication refills . Calls provider office for new concerns or questions  Please see past updates related to this goal by clicking on the "Past Updates" button in the selected goal       Patient verbalizes understanding of instructions provided today.   Telephone follow up appointment with care management team member scheduled for: 11/24/20  Delsa Sale, RN, BSN, CCM Care Management Coordinator [facility] Care Management/[facility]  Direct Phone: [phone]

## 2020-10-15 NOTE — Chronic Care Management (AMB) (Signed)
Chronic Care Management   Follow Up Note   10/13/2020 Name: Sherri [name] MRN: [id number] DOB: April 16, 1966  Referred by: Arnette Felts, FNP Reason for referral : Chronic Care Management (CCM RNCM FU Call )   Sherri [name] is a 54 y.o. year old female who is a primary care patient of Arnette Felts, FNP. The CCM team was consulted for assistance with chronic disease management and care coordination needs.    Review of patient status, including review of consultants reports, relevant laboratory and other test results, and collaboration with appropriate care team members and the patient's provider was performed as part of comprehensive patient evaluation and provision of chronic care management services.    SDOH (Social Determinants of Health) assessments performed: Yes  See Care Plan activities for detailed interventions related to [facility])     Outpatient Encounter Medications as of 10/13/2020  Medication Sig Note  . glipiZIDE (GLUCOTROL) 5 MG tablet TAKE 1 TABLET BY MOUTH EVERY DAY BEFORE MEALS   . metFORMIN (GLUCOPHAGE) 500 MG tablet TAKE 2 TABLETS (1,000 MG TOTAL) BY MOUTH 2 (TWO) TIMES DAILY WITH A MEAL.   [name] albuterol (VENTOLIN HFA) 108 (90 Base) MCG/ACT inhaler TAKE 2 PUFFS BY MOUTH EVERY 6 HOURS AS NEEDED FOR WHEEZE OR SHORTNESS OF BREATH   . Ascorbic Acid (VITAMIN C) 1000 MG tablet Take 1,000 mg by mouth daily.   . baclofen (LIORESAL) 20 MG tablet TAKE 1 AND 1/2 TABLETS BY  MOUTH IN THE MORNING AND IN THE EVENING AND 1 TABLET AT MIDDAY   . cetirizine (ZYRTEC) 10 MG tablet Take 1 tablet (10 mg total) by mouth daily.   . Cholecalciferol (VITAMIN D3) 10 MCG (400 UNIT) tablet Take 1 each by mouth daily.    . Cranberry-Vitamin C-Vitamin E (CRANBERRY PLUS VITAMIN C) 4200-20-3 MG-MG-UNIT CAPS Take by mouth. (Patient not taking: Reported on 07/26/2020)   . cyanocobalamin (,VITAMIN B-12,) 1000 MCG/ML injection Inject 1,000 mcg into the muscle every 3 (three) months.    . desloratadine  (CLARINEX) 5 MG tablet TAKE 1 TABLET BY MOUTH  DAILY   . diazepam (VALIUM) 10 MG tablet TAKE 2 TABLETS BY MOUTH 3  TIMES DAILY   . diphenhydrAMINE (BENADRYL) 25 MG tablet Take 25 mg by mouth at bedtime as needed for itching or allergies.  01/17/2017: Takes as needed  . EPINEPHRINE 0.3 mg/0.3 mL IJ SOAJ injection INJECT INTRAMUSCULARLY  0.3ML AS NEEDED FOR  ANAPHYLAXIS . SEEK MEDICAL  ATTENTION AFTER USE.   . famotidine (PEPCID) 20 MG tablet TAKE 1 TABLET BY MOUTH TWICE A DAY   . FLUoxetine (PROZAC) 10 MG capsule TAKE 1 CAPSULE BY MOUTH  DAILY   . gabapentin (NEURONTIN) 300 MG capsule TAKE 1 CAPSULE BY MOUTH TWO TIMES DAILY (Patient taking differently: Take 300 mg by mouth 2 (two) times daily. TAKE 1 CAPSULE BY MOUTH TWO TIMES DAILY)   . Glucose Blood (ACCU-CHEK SIMPLICITY TEST STRP VI) by In Vitro route. Insert 1 by subcutaneous route 4 times every day check blood sugar before breakfast, lunch and dinner and bedtime   . hydrOXYzine (VISTARIL) 100 MG capsule TAKE 1 CAPSULE (100 MG TOTAL) BY MOUTH 3 (THREE) TIMES DAILY AS NEEDED FOR ITCHING.   . metroNIDAZOLE (FLAGYL) 500 MG tablet Take 1 tablet (500 mg total) by mouth 2 (two) times daily.   . Olopatadine HCl (PAZEO) 0.7 % SOLN Apply 1 drop to eye as needed.    . vitamin E (VITAMIN E) 400 UNIT capsule Take 400 Units by mouth daily.  No facility-administered encounter medications on file as of 10/13/2020.     Objective:  Lab Results  Component Value Date   HGBA1C 6.4 (H) 06/03/2020   HGBA1C 8.5 (H) 01/15/2020   HGBA1C 5.9 (H) 04/23/2019   Lab Results  Component Value Date   MICROALBUR 30 06/04/2020   LDLCALC 83 06/03/2020   CREATININE 1.16 (H) 07/27/2020   BP Readings from Last 3 Encounters:  10/02/20 113/69  09/21/20 90/66  07/27/20 100/70    Goals Addressed      Patient Stated   .  COMPLETED: "I have a Summer cold" (pt-stated)   On track     CARE PLAN ENTRY (see longitudinal plan of care for additional care plan  information)  Current Barriers:  [name] Knowledge Deficits related to evaluation and treatment of cough/congestion and fever . 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 Inter-disciplinary care team collaboration (see longitudinal plan of care) . Evaluation of current treatment plan related to cough/congestion and fever and patient's adherence to plan as established by provider . Determined patient's symptoms have greatly improved, her COVID 19 test was negative . Determined patient continues to have a slight cough but states this is clearing and all symptoms have resolved  . Discussed plans with patient for ongoing care management follow up and provided patient with direct contact information for care management team  Patient Self Care Activities:  . Self administers medications as prescribed . Attends all scheduled provider appointments . Calls pharmacy for medication refills . Calls provider office for new concerns or questions  Please see past updates related to this goal by clicking on the "Past Updates" button in the selected goal      .  "To keep my Asthma under good control" (pt-stated)   On track     Current Barriers:  [name] Knowledge Deficits related to disease process and Self Health management of Asthma . 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 New 10/13/20 Over the next 180 days, patient will work with the CCM team and PCP to address needs related to disease education and support of Asthma  CCM RN CM Interventions:  10/13/20 call completed with patient   . Evaluation of current treatment plan related to Asthma and patient's adherence to plan as established by provider. . Determined patient's Asthma is stable, patient is established with [facility] for management of this condition . Educated patient on disease process and treatment recommendations, Educated on ways to improve Self Health management of this condition  . Discussed plans with patient for ongoing care management follow up and provided patient with direct contact information for care management team  Patient Self Care Activities:  . Self administers medications as prescribed . Attends all scheduled provider appointments . Calls pharmacy for medication refills . Calls provider office for new concerns or questions  Please see past updates related to this goal by clicking on the "Past Updates" button in the selected goal      .  "To keep my diabetes under good control" (pt-stated)   On track     Current Barriers:  [name] Knowledge Deficits related  to disease process and Self Health management of Asthma . 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 New 10/13/20 Over the next 180 days, patient will work with the CCM team and PCP to address needs related to disease education and support for Diabetes  CCM RN CM Interventions:  10/13/20 call completed with patient  . Evaluation of current treatment plan related to Diabetes and patient's adherence to plan as established by provider. . Provided education to patient re: decrease in A1c to 6.4; Re-educated on daily glycemic control, FBS 80-130, <180 after meals; re-educated on dietary and exercise recommendations . Determined patient continues to adhere to her prescribed DM treatment plan; Metformin 500 mg 2 tablets twice daily (1000 mg total) with a meal, Glipizide 5 mg daily with a meal  . Advised patient, providing education and  rationale, to check cbg daily before meals and record, calling the CCM team and or PCP for findings outside established parameters  . Discussed plans with patient for ongoing care management follow up and provided patient with direct contact information for care management team  Patient Self Care Activities:  . Self administers medications as prescribed . Attends all scheduled provider appointments . Calls pharmacy for medication refills . Calls provider office for new concerns or questions  Please see past updates related to this goal by clicking on the "Past Updates" button in the selected goal        Plan:   Telephone follow up appointment with care management team member scheduled for: 11/24/20  Delsa Sale, RN, BSN, CCM Care Management Coordinator [facility] Care Management/[facility]  Direct Phone: [phone]

## 2020-10-18 ENCOUNTER — Telehealth: Payer: Self-pay

## 2020-10-18 ENCOUNTER — Telehealth: Payer: Medicare Other

## 2020-10-18 NOTE — Telephone Encounter (Signed)
  Chronic Care Management   Outreach Note  10/18/2020 Name: Sherri [name] MRN: [id number] DOB: [birth year]  Referred by: Arnette Felts, FNP Reason for referral : care coordination  An unsuccessful telephone outreach was attempted today. The patient was referred to the case management team for assistance with care management and care coordination.   Follow Up Plan: The care management team will reach out to the patient again over the next 21 days.   [name], BSW, CDP Social Worker, Certified Dementia Practitioner [facility] Care Management [phone]

## 2020-10-25 ENCOUNTER — Telehealth: Payer: Self-pay

## 2020-10-25 NOTE — Chronic Care Management (AMB) (Deleted)
Chronic Care Management Pharmacy  Name: Sherri [name]  MRN: [id number] DOB: 14-May-1966  Chief Complaint/ HPI  Sherri [name],  54 y.o. , female presents for their Follow-Up CCM visit with the clinical pharmacist via telephone due to COVID-19 Pandemic.  PCP : Sherri Felts, FNP  Their chronic conditions include: Diabetes, Asthma, Depression and Stiff Person Syndrome  Office Visits: 07/16/20 MyChart message: Pt notified urine drug screen was positive for cocaine and benzodiazepine. Advised pt she could be set up for drug rehab.   06/22/20 Telephone call w/ [facility]: Verified w/ [facility] that pt is supposed to be taking BOTH desloratadine and Zyrtec per PCP.   06/03/20 AWV and OV: Pt reports low appetite. Significant weight loss. Urine drug screen ordered due to history of cocaine use, pt denies current use. HgbA1c decreased to 6.4%. TSH and B12 normal.   02/16/20 OV:  Metformin changed to XR to help with nausea. Take glipizide in the middle of the day to help with nausea. Recent episode of disorientation may have been related to being sleepy.   01/29/20 Telephone call: Pt advised to take 1 tablet glipizide daily and schedule follow up in 8-12 weeks.  Consult Visits: 06/30/20 Gynecology OV w/ Dr. Clearance Coots: Flagyl 500mg  twice daily for 7 days started for bacterial vaginosis. STD screen performed. 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   06/22/20 Neurology OV: Presented for follow up of stiff person syndrome. Stable on baclofen and diazepam. Also taking Prozac and gabapentin prescribed by this office. No medication changes.   03/15/20 Oncology telephone call: Notified pt that iron results from 3/17 are within normal limits.  03/10/20 Oncology OV w/ Dr. 03/12/20: Administered B12 injection today. Significant weight loss noted. Pt was given food from food pantry.   CCM Encounters: 06/04/20 RN: Evaluation of treatment plan related to abnormal weight loss. Collaboration with SW regarding Glucerna  samples.   05/17/20 SW: Pt feeling better, no longer complaining of cough. Samples of Glucerna available at office. Reviewed diet with patient.   05/13/20 SW: Pt reports not feelings well and continued weight loss. Pt had recent fall.   03/18/20 SW: Pt concerned about weight loss. Glucerna samples picked up from office. Pt concerned weight loss is due to cancer.  03/11/20 SW: Collaboration with [employer] for 4 week extension of food delivery  03/08/20 SW: Pt received notice that she will be evicted on 17th due to lack of payment. Pt states she has paid rent on time and paid extra the last 3 months for pest control. Pt contacted corporate office and awaiting response. Advised pt to contact 18 regarding notice. Someone has arrived to begin removing mold from pt's apartment.    03/05/20 PharmD: Discussed GLP1 versus SGLT2. Recommend repeat lipid panel and A1c. Pt states BG <70 and <200.  03/05/20 SW: 05/05/20, home inspector with [facility], to determine what information is needed on requisition order  03/02/20 SW: Referral via [id number] to 05/02/20.  02/17/20 SW: Referral placed to [employer] to obtain 4 weeks of diabetic friendly meals for pt. Collaboration with [employer] to request Glucerna samples be sent to PCP office. Pt contacted 02/19/20 in [place] with [facility] regarding mold concerns in home. Awaiting a return call.   02/10/20 RN: Pt states she is doing better and was just sleepy yesterday. FBG was 62 this morning. Pt no longer receiving food stamps. Unable to afford Ensure or Glucerna. Collaborate with SW.   02/09/20 SW: During  call pt was slurring her words and sounded lethargic. States she was unable to check BG and had not taken her medications for the day. SW called 911 to go to pt's home. Upon EMS arrival BP 92/58 and BG 368. Pt refused medical treatment and transport to the hospital.   Medications: Outpatient  Encounter Medications as of 10/25/2020  Medication Sig Note  . albuterol (VENTOLIN HFA) 108 (90 Base) MCG/ACT inhaler TAKE 2 PUFFS BY MOUTH EVERY 6 HOURS AS NEEDED FOR WHEEZE OR SHORTNESS OF BREATH   . Ascorbic Acid (VITAMIN C) 1000 MG tablet Take 1,000 mg by mouth daily.   . baclofen (LIORESAL) 20 MG tablet TAKE 1 AND 1/2 TABLETS BY  MOUTH IN THE MORNING AND IN THE EVENING AND 1 TABLET AT MIDDAY   . cetirizine (ZYRTEC) 10 MG tablet Take 1 tablet (10 mg total) by mouth daily.   . Cholecalciferol (VITAMIN D3) 10 MCG (400 UNIT) tablet Take 1 each by mouth daily.    . Cranberry-Vitamin C-Vitamin E (CRANBERRY PLUS VITAMIN C) 4200-20-3 MG-MG-UNIT CAPS Take by mouth. (Patient not taking: Reported on 07/26/2020)   . cyanocobalamin (,VITAMIN B-12,) 1000 MCG/ML injection Inject 1,000 mcg into the muscle every 3 (three) months.    . desloratadine (CLARINEX) 5 MG tablet TAKE 1 TABLET BY MOUTH  DAILY   . diazepam (VALIUM) 10 MG tablet TAKE 2 TABLETS BY MOUTH 3  TIMES DAILY   . diphenhydrAMINE (BENADRYL) 25 MG tablet Take 25 mg by mouth at bedtime as needed for itching or allergies.  01/17/2017: Takes as needed  . EPINEPHRINE 0.3 mg/0.3 mL IJ SOAJ injection INJECT INTRAMUSCULARLY  0.3ML AS NEEDED FOR  ANAPHYLAXIS . SEEK MEDICAL  ATTENTION AFTER USE.   . famotidine (PEPCID) 20 MG tablet TAKE 1 TABLET BY MOUTH TWICE A DAY   . FLUoxetine (PROZAC) 10 MG capsule TAKE 1 CAPSULE BY MOUTH  DAILY   . gabapentin (NEURONTIN) 300 MG capsule TAKE 1 CAPSULE BY MOUTH TWO TIMES DAILY (Patient taking differently: Take 300 mg by mouth 2 (two) times daily. TAKE 1 CAPSULE BY MOUTH TWO TIMES DAILY)   . glipiZIDE (GLUCOTROL) 5 MG tablet TAKE 1 TABLET BY MOUTH EVERY DAY BEFORE MEALS   . Glucose Blood (ACCU-CHEK SIMPLICITY TEST STRP VI) by In Vitro route. Insert 1 by subcutaneous route 4 times every day check blood sugar before breakfast, lunch and dinner and bedtime   . hydrOXYzine (VISTARIL) 100 MG capsule TAKE 1 CAPSULE (100 MG TOTAL)  BY MOUTH 3 (THREE) TIMES DAILY AS NEEDED FOR ITCHING.   . metFORMIN (GLUCOPHAGE) 500 MG tablet TAKE 2 TABLETS (1,000 MG TOTAL) BY MOUTH 2 (TWO) TIMES DAILY WITH A MEAL.   [name] metroNIDAZOLE (FLAGYL) 500 MG tablet Take 1 tablet (500 mg total) by mouth 2 (two) times daily.   . Olopatadine HCl (PAZEO) 0.7 % SOLN Apply 1 drop to eye as needed.    . vitamin E (VITAMIN E) 400 UNIT capsule Take 400 Units by mouth daily.     No facility-administered encounter medications on file as of 10/25/2020.    Current Diagnosis/Assessment:    Goals Addressed   None     Diabetes   A1c goal <7%  Recent Relevant Labs: Lab Results  Component Value Date/Time   HGBA1C 6.4 (H) 06/03/2020 05:00 PM   HGBA1C 8.5 (H) 01/15/2020 12:12 PM   MICROALBUR 30 06/04/2020 12:29 PM    Last diabetic Eye exam: No results found for: HMDIABEYEEXA  Last diabetic Foot exam: No results  found for: HMDIABFOOTEX   Checking BG: 3 times per week  Recent FBG Readings: 62 lowest, 85-100 Recent pre-meal BG readings:  Recent 2hr PP BG readings:   Recent HS BG readings:   Patient has failed these meds in past: N/A Patient is currently controlled on the following medications: [name] Glipizide 5mg  daily (Per recent note suggested to take midday) . Metformin 500mg  2 tablets twice daily with a meal  We discussed:  . Takes glipizide at lunch now Diet extensively Tries to limit carbohydrates (states she can have 45g per meal) Eats salads and fruit Says she eats 8 small meals a day Fried egg sandwich, oatmeal, baked chicken Some fast food (salads, chicken nuggets) Does not use salt, uses pepper Does not eat much fried food No sweets Recommend balanced meal with lean protein, vegetables, and small serving of carbohydrates Reports that she drinks about 32 ounces of water daily Recommend pt drink 64 ounces of water daily Exercise extensively Walks with a cane Typical weekly workout includes 30 squats and 30 leg lifts  daily . Checks BG three times daily when she checks 3 times per week  Plan Continue current medications   Hyperlipidemia   LDL goal < 100  Lipid Panel     Component Value Date/Time   CHOL 172 06/03/2020 1700   TRIG 76 06/03/2020 1700   HDL 75 06/03/2020 1700   LDLCALC 83 06/03/2020 1700    Hepatic Function Latest Ref Rng & Units 07/27/2020 06/03/2020 03/10/2020  Total Protein 6.5 - 8.1 g/dL 7.1 6.9 7.3  Albumin 3.5 - 5.0 g/dL 4.5 4.0 4.8  AST 15 - 41 U/L 15 12 12(L)  ALT 0 - 44 U/L 21 11 12   Alk Phosphatase 38 - 126 U/L 72 79 60  Total Bilirubin 0.3 - 1.2 mg/dL 08/03/2020) 03/12/2020 0.3     The 10-year ASCVD risk score DC Jr., et al., 2013) is: 3.7%   Values used to calculate the score:     Age: 66 years     Sex: Female     Is Non-Hispanic African American: Yes     Diabetic: Yes     Tobacco smoker: No     Systolic Blood Pressure: 113 mmHg     Is BP treated: No     HDL Cholesterol: 75 mg/dL     Total Cholesterol: 172 mg/dL   Patient has failed these meds in past: N/A Patient is currently controlled on the following medications:  . N/A  We discussed:  . Diet and exercise extensively  Plan Continue control with diet and exercise  Consider moderate intensity statin due to diabetes history  Depression   Depression screen [facility] 2/9 06/03/2020 02/16/2020 01/15/2020  Decreased Interest 0 0 0  Down, Depressed, Hopeless 0 0 0  PHQ - 2 Score 0 0 0  Altered sleeping 0 - 0  Tired, decreased energy 0 - 0  Change in appetite 0 - 0  Feeling bad or failure about yourself  0 - 0  Trouble concentrating 0 - 0  Moving slowly or fidgety/restless 0 - 0  Suicidal thoughts 0 - 0  PHQ-9 Score 0 - 0  Difficult doing work/chores Not difficult at all - Not difficult at all  Some recent data might be hidden   Patient has failed these meds in past: Lexapro, alprazolam Patient is currently controlled on the following medications:  . Fluoxetine 10mg  daily  We discussed:    Diagnosed after  being injured at work and  being diagnosed with stiff person syndrome  Plan Continue current medications  Stiff Person Syndrome   Patient has failed these meds in past: N/A Patient is currently controlled on the following medications:   Baclofen 20mg  1.5 tablets in the morning and evening and 1 tablet midday  Diazepam 10mg  2 tablets three times daily  Gabapentin 300mg  2 capsules twice daily  We discussed:   Has to take medications as prescribed in order for disease not to progress  Pt reports issues with falls previously  Advised patient to watch for signs/symptoms of CNS depression (drowsiness, trouble breathing, etc)  Pt mentioned being tired recently  She wakes up at 4:30/5:30am, eats breakfast and then takes morning medications, then she goes back to sleep for a couple of hours  Pt mentioned that she goes back to sleep to get the medications out of her system and so she can drive  Advised pt that going back to sleep does not get the medications out of her system and that several of the medications that she takes take several hours to get to peak effect in her system  Pt then wakes up around 9 am, watches TV, eat and then goes back to sleep  Pt did not state how long she is sleeping, but states that she is sleeping a lot  Plan Continue current medications  Asthma/ Allergies   Patient has failed these meds in past: Advair, Slick Patient is currently controlled on the following medications:   Cetirizine 10mg  daily  Desloratadine 5mg  daily  Diphenhydramine 25mg  at bedtime as needed for itching or allergies  Hydroxyzine pamoate 100mg  three times daily as needed for itching  Olopatadine 0.7% 1 drop to eye as needed  Epipen as needed for anaphylaxis  Ventolin HFA every 6 hours as needed for wheezing or SOB  We discussed:   Proper Epipen storage and that pt can use until expiration date on box  Using hydroxyzine for hives possibly due to mold  Zyrtec and  desloratadine originally started for allergic reaction and this combination is all that helped get reaction under control  Pt states that she feels like Proair works faster than Ventolin does   Explained that both medication have the same active ingredient and mechanism of action  Plan Continue current medications  Osteopenia / Osteoporosis   Last DEXA Scan: last ordered 10/24/18, expired  T-Score femoral neck:   T-Score total hip:   T-Score lumbar spine:   T-Score forearm radius:   10-year probability of major osteoporotic fracture:   10-year probability of hip fracture:   Vitamin D, 25-Hydroxy  Date Value Ref Range Status  12/26/2016 74.0 30.0 - 100.0 ng/mL Final    Comment:    Vitamin D deficiency has been defined by the Institute of Medicine and an Endocrine Society practice guideline as a level of serum 25-OH vitamin D less than 20 ng/mL (1,2). The Endocrine Society went on to further define vitamin D insufficiency as a level between 21 and 29 ng/mL (2). 1. IOM (Institute of Medicine). 2010. Dietary reference    intakes for calcium and D. [place]: The    [employer]. 2. Holick MF, Binkley [place], Bischoff-Ferrari HA, et al.    Evaluation, treatment, and prevention of vitamin D    deficiency: an Endocrine Society clinical practice    guideline. JCEM. 2011 Jul; 96(7):1911-30.     Patient is not a candidate for pharmacologic treatment  Patient has failed these meds in past: Ergocalciferol  Patient is  currently controlled on the following medications:  . Cholecalciferol 5000 units daly  We discussed:  Recommend [phone] units of vitamin D daily.   Discuss at follow up  Plan Continue current medications  Recommend DEXA scan  Health Maintenance   Patient is currently on the following medications:  [name] Vitamin E 400 units daily . Vitamin C $RemoveB'1000mg'hfrCgolw$  daily . Vitamin B12 1094mcg injection every 3 months . Cranberry/Vitamin C/Vitamin E daily . Famotidine  $RemoveBef'20mg'CyJQHNVJwe$  twice daily  We discussed:   . B12 injection for pernicious anemia every 3 months . Famotidine helps with acid reflux symptoms . Cranberry tablets help with UTI prevention  Plan Continue current medications   Vaccines   Reviewed and discussed patient's vaccination history.    Immunization History  Administered Date(s) Administered  . Tdap 04/25/2016   Plan Review and discuss at follow up  Medication Management   Pt uses [facility] for all medications Uses pill box? Yes Pt endorses 100% compliance  We discussed:  . Importance of taking each medications daily as directed . Pt only really has to pay for OTC vitamins, most other medication she gets through [employer] for free  Plan Continue current medication management strategy  Follow up:   Beverly Milch, PharmD Clinical Pharmacist [place] [phone]

## 2020-10-27 ENCOUNTER — Inpatient Hospital Stay: Payer: Medicare Other

## 2020-10-27 ENCOUNTER — Inpatient Hospital Stay: Payer: Medicare Other | Admitting: Hematology & Oncology

## 2020-11-05 ENCOUNTER — Telehealth: Payer: Medicare Other

## 2020-11-05 ENCOUNTER — Telehealth: Payer: Self-pay

## 2020-11-05 NOTE — Telephone Encounter (Signed)
°  Chronic Care Management   Outreach Note  11/05/2020 Name: Sherri [name] MRN: [id number] DOB: [birth year]  Referred by: Arnette Felts, FNP Reason for referral : Care Coordination   A second unsuccessful telephone outreach was attempted today. The patient was referred to the case management team for assistance with care management and care coordination.   Follow Up Plan: A HIPAA compliant phone message was left for the patient providing contact information and requesting a return call.  The care management team will reach out to the patient again over the next 30 days.   [name], BSW, CDP Social Worker, Certified Dementia Practitioner [facility] Care Management [phone]

## 2020-11-07 ENCOUNTER — Other Ambulatory Visit: Payer: Self-pay

## 2020-11-07 ENCOUNTER — Encounter (HOSPITAL_COMMUNITY): Payer: Self-pay

## 2020-11-07 ENCOUNTER — Ambulatory Visit (HOSPITAL_COMMUNITY)
Admission: EM | Admit: 2020-11-07 | Discharge: 2020-11-07 | Disposition: A | Discharge status: Home / Self Care | Payer: Medicare Other | Attending: Emergency Medicine | Admitting: Emergency Medicine

## 2020-11-07 DIAGNOSIS — J22 Unspecified acute lower respiratory infection: Secondary | ICD-10-CM | POA: Diagnosis not present

## 2020-11-07 DIAGNOSIS — J4521 Mild intermittent asthma with (acute) exacerbation: Secondary | ICD-10-CM

## 2020-11-07 MED ORDER — PREDNISONE 20 MG PO TABS: 40.0000 mg | ORAL_TABLET | Freq: Every day | ORAL | 0 refills | Status: AC

## 2020-11-07 MED ORDER — DOXYCYCLINE HYCLATE 100 MG PO CAPS: 100.0000 mg | ORAL_CAPSULE | Freq: Two times a day (BID) | ORAL | 0 refills | Status: DC

## 2020-11-07 NOTE — ED Triage Notes (Signed)
Pt present coughing and SOB. Pt states symptoms started two weeks ago. Pt states having difficulty breathing while talking and dry mouth.

## 2020-11-07 NOTE — Discharge Instructions (Signed)
Push fluids to ensure adequate hydration and keep secretions thin.  Complete course of antibiotics.  Prednisone for 5 days.  Use of inhaler as needed for wheezing or shortness of breath.   Decrease to quit smoking as this can perpetuate your symptoms.  If symptoms worsen or do not improve in the next week to return to be seen or to follow up with your PCP.

## 2020-11-07 NOTE — ED Provider Notes (Signed)
[facility]    CSN: [id number] Arrival date & time: 11/07/20  1747      History   Chief Complaint Chief Complaint  Patient presents with   Cough   Shortness of Breath    HPI Sherri [name] is a 54 y.o. female.   Sherri [name] presents with complaints of cough. Started two weeks ago. Nasal drainage. Cough is productive. Wheezing and shortness of breath on exertion. No fevers. History of asthma. She does have an inhaler for prn use, hasn't used it before arrival here tonight. No chest pain. Her boyfriend was ill prior to onset of her symptoms. Sore throat yesterday, minimal today.     ROS per HPI, negative if not otherwise mentioned.      Past Medical History:  Diagnosis Date   Angioedema    Anxiety    Arthritis    Asthma    Benzodiazepine withdrawal (HCC) 08/30/2012   CAP (community acquired pneumonia) 08/29/2012   Chronic back pain    Chronic kidney disease    Closed nondisplaced fracture of proximal phalanx of right little finger 10/18/2018   Cocaine abuse (HCC)    Depression    Diarrhea    DVT (deep venous thrombosis) (HCC)    Environmental allergies    Fall    GERD (gastroesophageal reflux disease)    Heart murmur    has been told once that she has a heart murmur, but has never had any problems   Hiatal hernia 03/21/2013   Lumbar herniated disc    Peripheral vascular disease (HCC)    Pernicious anemia 10/30/2011   Postconcussion syndrome 12/30/2014   Rhabdomyolysis 08/27/2012   Seasonal allergies    Stiff person syndrome    Urticaria     Patient Active Problem List   Diagnosis Date Noted   Chronic rhinitis 04/24/2019   Keloid 10/10/2018   Diabetes type 2, controlled (HCC) 10/10/2018   Depression 01/02/2017   History of DVT (deep vein thrombosis) 01/02/2017   Chronic pain syndrome 01/02/2017   Cocaine abuse (HCC) 01/02/2017   Dysphagia    Adjustment insomnia    Distal radius fracture, right 08/16/2016    Postconcussion syndrome 12/30/2014   Tremor 03/11/2014   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   Hiatal hernia 03/21/2013   Anemia, iron deficiency 01/29/2013   Spasm of muscle 09/02/2012   Anemia 08/28/2012   Mild intermittent asthma 08/27/2012   Chronic back pain    Pernicious anemia 10/30/2011   DEGENERATIVE DISC DISEASE 08/18/2010   Asthma with bronchitis 07/28/2010   Recurrent urticaria 07/28/2010    Past Surgical History:  Procedure Laterality Date   BUNIONECTOMY Left    colonoscopy  07/09/15   inguinal hernia 1983  1983   OPEN REDUCTION INTERNAL FIXATION (ORIF) DISTAL RADIAL FRACTURE Right 08/16/2016   Procedure: OPEN REDUCTION INTERNAL FIXATION (ORIF) DISTAL RADIAL FRACTURE;  Surgeon: Eldred Manges, MD;  Location: [facility] OR;  Service: Orthopedics;  Laterality: Right;    OB History    Gravida  1   Para  1   Term  1   Preterm  0   AB  0   Living  1     SAB      TAB      Ectopic      Multiple      Live Births               Home Medications    Prior to Admission medications   Medication Sig Start Date  End Date Taking? Authorizing Provider  albuterol (VENTOLIN HFA) 108 (90 Base) MCG/ACT inhaler TAKE 2 PUFFS BY MOUTH EVERY 6 HOURS AS NEEDED FOR WHEEZE OR SHORTNESS OF BREATH 09/21/20   Arnette Felts, FNP  Ascorbic Acid (VITAMIN C) 1000 MG tablet Take 1,000 mg by mouth daily.    [provider]  baclofen (LIORESAL) 20 MG tablet TAKE 1 AND 1/2 TABLETS BY  MOUTH IN THE MORNING AND IN THE EVENING AND 1 TABLET AT MIDDAY 10/08/19   York Spaniel, MD  cetirizine (ZYRTEC) 10 MG tablet Take 1 tablet (10 mg total) by mouth daily. 06/17/20   Arnette Felts, FNP  Cholecalciferol (VITAMIN D3) 10 MCG (400 UNIT) tablet Take 1 each by mouth daily.     [provider]  Cranberry-Vitamin C-Vitamin E (CRANBERRY PLUS VITAMIN C) 4200-20-3 MG-MG-UNIT CAPS Take by mouth. Patient not taking: Reported on 07/26/2020    [provider]    cyanocobalamin (,VITAMIN B-12,) 1000 MCG/ML injection Inject 1,000 mcg into the muscle every 3 (three) months.     [provider]  desloratadine (CLARINEX) 5 MG tablet TAKE 1 TABLET BY MOUTH  DAILY 08/12/20   Arnette Felts, FNP  diazepam (VALIUM) 10 MG tablet TAKE 2 TABLETS BY MOUTH 3  TIMES DAILY 08/12/20   York Spaniel, MD  diphenhydrAMINE (BENADRYL) 25 MG tablet Take 25 mg by mouth at bedtime as needed for itching or allergies.     [provider]  doxycycline (VIBRAMYCIN) 100 MG capsule Take 1 capsule (100 mg total) by mouth 2 (two) times daily. 11/07/20   Edson Deridder, Dorene Grebe B, NP  EPINEPHRINE 0.3 mg/0.3 mL IJ SOAJ injection INJECT INTRAMUSCULARLY  0.3ML AS NEEDED FOR  ANAPHYLAXIS . SEEK MEDICAL  ATTENTION AFTER USE. 08/12/20   Arnette Felts, FNP  famotidine (PEPCID) 20 MG tablet TAKE 1 TABLET BY MOUTH TWICE A DAY 08/16/20   Arnette Felts, FNP  FLUoxetine (PROZAC) 10 MG capsule TAKE 1 CAPSULE BY MOUTH  DAILY 05/05/20   Glean Salvo, NP  gabapentin (NEURONTIN) 300 MG capsule TAKE 1 CAPSULE BY MOUTH TWO TIMES DAILY Patient taking differently: Take 300 mg by mouth 2 (two) times daily. TAKE 1 CAPSULE BY MOUTH TWO TIMES DAILY 06/22/20   Glean Salvo, NP  glipiZIDE (GLUCOTROL) 5 MG tablet TAKE 1 TABLET BY MOUTH EVERY DAY BEFORE MEALS 08/24/20   Arnette Felts, FNP  Glucose Blood (ACCU-CHEK SIMPLICITY TEST STRP VI) by In Vitro route. Insert 1 by subcutaneous route 4 times every day check blood sugar before breakfast, lunch and dinner and bedtime    [provider]  hydrOXYzine (VISTARIL) 100 MG capsule TAKE 1 CAPSULE (100 MG TOTAL) BY MOUTH 3 (THREE) TIMES DAILY AS NEEDED FOR ITCHING. 09/21/20   Arnette Felts, FNP  metFORMIN (GLUCOPHAGE) 500 MG tablet TAKE 2 TABLETS (1,000 MG TOTAL) BY MOUTH 2 (TWO) TIMES DAILY WITH A MEAL. 05/21/20   Arnette Felts, FNP  metroNIDAZOLE (FLAGYL) 500 MG tablet Take 1 tablet (500 mg total) by mouth 2 (two) times daily. 09/21/20   Brock Bad, MD   Olopatadine HCl (PAZEO) 0.7 % SOLN Apply 1 drop to eye as needed.     [provider]  predniSONE (DELTASONE) 20 MG tablet Take 2 tablets (40 mg total) by mouth daily with breakfast for 5 days. 11/07/20 11/12/20  Georgetta Haber, NP  vitamin E (VITAMIN E) 400 UNIT capsule Take 400 Units by mouth daily.     [provider]    Family History  Family History  Problem Relation Age of Onset   Cancer Mother    Other Mother    COPD Father    Asthma Brother    Cancer Brother        colon   Heart attack Brother    Seizures Brother    Allergic rhinitis Neg Hx    Angioedema Neg Hx    Eczema Neg Hx    Immunodeficiency Neg Hx    Urticaria Neg Hx     Social History Social History   Tobacco Use   Smoking status: Never Smoker   Smokeless tobacco: Never Used   Tobacco comment: Never Used Tobacco  Vaping Use   Vaping Use: Never used  Substance Use Topics   Alcohol use: Not Currently    Alcohol/week: 0.0 standard drinks    Comment: Occasional   Drug use: Not Currently    Types: Cocaine     Allergies   Ibuprofen, Lemon flavor, Amoxicillin, and Tylenol [acetaminophen]   Review of Systems Review of Systems   Physical Exam Triage Vital Signs ED Triage Vitals  Enc Vitals Group     BP 11/07/20 1808 120/73     Pulse Rate 11/07/20 1808 (!) 101     Resp 11/07/20 1808 18     Temp 11/07/20 1808 98.3 F (36.8 C)     Temp Source 11/07/20 1808 Oral     SpO2 11/07/20 1808 99 %     Weight --      Height --      Head Circumference --      Peak Flow --      Pain Score 11/07/20 1807 0     Pain Loc --      Pain Edu? --      Excl. in GC? --    No data found.  Updated Vital Signs BP 120/73 (BP Location: Right Arm)    Pulse (!) 101    Temp 98.3 F (36.8 C) (Oral)    Resp 18    LMP 07/29/2012 (Approximate)    SpO2 99%   Visual Acuity Right Eye Distance:   Left Eye Distance:   Bilateral Distance:    Right Eye Near:   Left Eye Near:     Bilateral Near:     Physical Exam Constitutional:      General: She is not in acute distress.    Appearance: She is well-developed.  Cardiovascular:     Rate and Rhythm: Normal rate.  Pulmonary:     Effort: Pulmonary effort is normal.     Breath sounds: Examination of the right-lower field reveals decreased breath sounds. Examination of the left-lower field reveals decreased breath sounds. Decreased breath sounds and wheezing present.     Comments: Strong congested cough noted Skin:    General: Skin is warm and dry.  Neurological:     Mental Status: She is alert and oriented to person, place, and time.      UC Treatments / Results  Labs (all labs ordered are listed, but only abnormal results are displayed) Labs Reviewed - No data to display  EKG   Radiology No results found.  Procedures Procedures (including critical care time)  Medications Ordered in UC Medications - No data to display  Initial Impression / Assessment and Plan / UC Course  I have reviewed the triage vital signs and the nursing notes.  Pertinent labs & imaging results that were available during my care of the patient were reviewed by me and  considered in my medical decision making (see chart for details).     Cough and congestion with productive cough and wheezing, for the past 2 weeks. Opted to provide antibiotics in addition to steroids. Return precautions provided. Patient verbalized understanding and agreeable to plan. Ambulatory out of clinic without difficulty.    Final Clinical Impressions(s) / UC Diagnoses   Final diagnoses:  Lower respiratory tract infection  Mild intermittent asthma with exacerbation     Discharge Instructions     Push fluids to ensure adequate hydration and keep secretions thin.  Complete course of antibiotics.  Prednisone for 5 days.  Use of inhaler as needed for wheezing or shortness of breath.   Decrease to quit smoking as this can perpetuate your symptoms.   If symptoms worsen or do not improve in the next week to return to be seen or to follow up with your PCP.     ED Prescriptions    Medication Sig Dispense Auth. Provider   doxycycline (VIBRAMYCIN) 100 MG capsule Take 1 capsule (100 mg total) by mouth 2 (two) times daily. 20 capsule Linus MakoBurky, Treyten Monestime B, NP   predniSONE (DELTASONE) 20 MG tablet Take 2 tablets (40 mg total) by mouth daily with breakfast for 5 days. 10 tablet Georgetta HaberBurky, Oluwasemilore Pascuzzi B, NP     PDMP not reviewed this encounter.   Georgetta HaberBurky, Lennan Malone B, NP 11/07/20 [phone]

## 2020-11-10 ENCOUNTER — Telehealth: Payer: Self-pay

## 2020-11-10 NOTE — Telephone Encounter (Signed)
Pt called in to r/s her 11/23 appt with Dr. Irma Newness.  Pt states that she did not cx the appt originally???, appt has been r/s with Maralyn Sago...  AOM

## 2020-11-12 ENCOUNTER — Emergency Department (HOSPITAL_COMMUNITY)
Admission: EM | Admit: 2020-11-12 | Discharge: 2020-11-12 | Disposition: A | Discharge status: Home / Self Care | Payer: Medicare Other | Attending: Emergency Medicine | Admitting: Emergency Medicine

## 2020-11-12 ENCOUNTER — Encounter (HOSPITAL_COMMUNITY): Payer: Self-pay

## 2020-11-12 ENCOUNTER — Emergency Department (HOSPITAL_COMMUNITY): Payer: Medicare Other

## 2020-11-12 ENCOUNTER — Other Ambulatory Visit: Payer: Self-pay

## 2020-11-12 DIAGNOSIS — R0602 Shortness of breath: Secondary | ICD-10-CM | POA: Diagnosis not present

## 2020-11-12 DIAGNOSIS — R739 Hyperglycemia, unspecified: Secondary | ICD-10-CM | POA: Diagnosis not present

## 2020-11-12 DIAGNOSIS — Z7984 Long term (current) use of oral hypoglycemic drugs: Secondary | ICD-10-CM | POA: Insufficient documentation

## 2020-11-12 DIAGNOSIS — N189 Chronic kidney disease, unspecified: Secondary | ICD-10-CM | POA: Diagnosis not present

## 2020-11-12 DIAGNOSIS — R531 Weakness: Secondary | ICD-10-CM | POA: Diagnosis not present

## 2020-11-12 DIAGNOSIS — E875 Hyperkalemia: Secondary | ICD-10-CM | POA: Insufficient documentation

## 2020-11-12 DIAGNOSIS — J4520 Mild intermittent asthma, uncomplicated: Secondary | ICD-10-CM | POA: Diagnosis not present

## 2020-11-12 DIAGNOSIS — R059 Cough, unspecified: Secondary | ICD-10-CM | POA: Diagnosis not present

## 2020-11-12 DIAGNOSIS — E1165 Type 2 diabetes mellitus with hyperglycemia: Secondary | ICD-10-CM | POA: Diagnosis not present

## 2020-11-12 DIAGNOSIS — E1122 Type 2 diabetes mellitus with diabetic chronic kidney disease: Secondary | ICD-10-CM | POA: Insufficient documentation

## 2020-11-12 DIAGNOSIS — I499 Cardiac arrhythmia, unspecified: Secondary | ICD-10-CM | POA: Diagnosis not present

## 2020-11-12 DIAGNOSIS — R000 Tachycardia, unspecified: Secondary | ICD-10-CM | POA: Diagnosis not present

## 2020-11-12 LAB — CBC WITH DIFFERENTIAL/PLATELET
Abs Immature Granulocytes: 0.03 10*3/uL (ref 0.00–0.07)
Basophils Absolute: 0 10*3/uL (ref 0.0–0.1)
Basophils Relative: 0 %
Eosinophils Absolute: 0 10*3/uL (ref 0.0–0.5)
Eosinophils Relative: 0 %
HCT: 40.2 % (ref 36.0–46.0)
Hemoglobin: 13.2 g/dL (ref 12.0–15.0)
Immature Granulocytes: 0 %
Lymphocytes Relative: 18 %
Lymphs Abs: 1.6 10*3/uL (ref 0.7–4.0)
MCH: 28.8 pg (ref 26.0–34.0)
MCHC: 32.8 g/dL (ref 30.0–36.0)
MCV: 87.8 fL (ref 80.0–100.0)
Monocytes Absolute: 0.4 10*3/uL (ref 0.1–1.0)
Monocytes Relative: 4 %
Neutro Abs: 7.2 10*3/uL (ref 1.7–7.7)
Neutrophils Relative %: 78 %
Platelets: 258 10*3/uL (ref 150–400)
RBC: 4.58 MIL/uL (ref 3.87–5.11)
RDW: 12.2 % (ref 11.5–15.5)
WBC: 9.2 10*3/uL (ref 4.0–10.5)
nRBC: 0 % (ref 0.0–0.2)

## 2020-11-12 LAB — BASIC METABOLIC PANEL
Anion gap: 11 (ref 5–15)
Anion gap: 11 (ref 5–15)
BUN: 23 mg/dL — ABNORMAL HIGH (ref 6–20)
BUN: 22 mg/dL — ABNORMAL HIGH (ref 6–20)
CO2: 24 mmol/L (ref 22–32)
CO2: 22 mmol/L (ref 22–32)
Calcium: 9.2 mg/dL (ref 8.9–10.3)
Calcium: 9 mg/dL (ref 8.9–10.3)
Chloride: 100 mmol/L (ref 98–111)
Chloride: 101 mmol/L (ref 98–111)
Creatinine, Ser: 1.1 mg/dL — ABNORMAL HIGH (ref 0.44–1.00)
Creatinine, Ser: 1 mg/dL (ref 0.44–1.00)
GFR, Estimated: 59 mL/min — ABNORMAL LOW (ref >60)
GFR, Estimated: >60 mL/min (ref >60)
Glucose, Bld: 311 mg/dL — ABNORMAL HIGH (ref 70–99)
Glucose, Bld: 299 mg/dL — ABNORMAL HIGH (ref 70–99)
Potassium: 6 mmol/L — ABNORMAL HIGH (ref 3.5–5.1)
Potassium: 4.3 mmol/L (ref 3.5–5.1)
Sodium: 135 mmol/L (ref 135–145)
Sodium: 134 mmol/L — ABNORMAL LOW (ref 135–145)

## 2020-11-12 LAB — CBG MONITORING, ED: Glucose-Capillary: 288 mg/dL — ABNORMAL HIGH (ref 70–99)

## 2020-11-12 MED ORDER — CALCIUM GLUCONATE 10 % IV SOLN: 1.0000 g | Freq: Once | INTRAVENOUS | Status: DC

## 2020-11-12 MED ORDER — CALCIUM GLUCONATE-NACL 1-0.675 GM/50ML-% IV SOLN
1.0000 g | Freq: Once | INTRAVENOUS | Status: AC
  Administered 2020-11-12: 1000 mg via INTRAVENOUS
  Filled 2020-11-12: qty 50

## 2020-11-12 MED ORDER — INSULIN ASPART 100 UNIT/ML IV SOLN
5.0000 [IU] | Freq: Once | INTRAVENOUS | Status: AC
  Administered 2020-11-12: 5 [IU] via INTRAVENOUS
  Filled 2020-11-12: qty 0.05

## 2020-11-12 NOTE — ED Provider Notes (Signed)
[facility]-EMERGENCY DEPT Provider Note   CSN: [id number] Arrival date & time: 11/12/20  1344     History Chief Complaint  Patient presents with  . Hyperglycemia    Sherri [name] is a 54 y.o. female with a past medical history of substance abuse, PVD, diabetes, chronic kidney disease presenting to the ED with a chief complaint of hyperglycemia.  On 11/07/2020 was seen and evaluated at urgent care for productive cough and wheezing.  She was placed on prednisone burst for 5 days and doxycycline.  She has been taking these medications as prescribed up until yesterday.  She noticed that her sugars were running high.  Reports polyuria and polydipsia.  She admits that she is not always compliant with taking her Metformin twice a day as well as her glipizide once a day.  She checked her sugars yesterday and found to be over 100, today her sugars were in the 470s.  She is concerned this could be due to the prednisone.  Also has been drinking some teas that she feels "could have been sweeter than they needed to be."  She denies any abdominal pain, chest pain, fever.  States that her respiratory symptoms have improved.  Has been having some episodes of nonbloody, nonbilious emesis.  No diarrhea.  HPI     Past Medical History:  Diagnosis Date  . Angioedema   . Anxiety   . Arthritis   . Asthma   . Benzodiazepine withdrawal (HCC) 08/30/2012  . CAP (community acquired pneumonia) 08/29/2012  . Chronic back pain   . Chronic kidney disease   . Closed nondisplaced fracture of proximal phalanx of right little finger 10/18/2018  . Cocaine abuse (HCC)   . Depression   . Diarrhea   . DVT (deep venous thrombosis) (HCC)   . Environmental allergies   . Fall   . GERD (gastroesophageal reflux disease)   . Heart murmur    has been told once that she has a heart murmur, but has never had any problems  . Hiatal hernia 03/21/2013  . Lumbar herniated disc   . Peripheral vascular disease  (HCC)   . Pernicious anemia 10/30/2011  . Postconcussion syndrome 12/30/2014  . Rhabdomyolysis 08/27/2012  . Seasonal allergies   . Stiff person syndrome   . Urticaria     Patient Active Problem List   Diagnosis Date Noted  . Chronic rhinitis 04/24/2019  . Keloid 10/10/2018  . Diabetes type 2, controlled (HCC) 10/10/2018  . Depression 01/02/2017  . History of DVT (deep vein thrombosis) 01/02/2017  . Chronic pain syndrome 01/02/2017  . Cocaine abuse (HCC) 01/02/2017  . Dysphagia   . Adjustment insomnia   . Distal radius fracture, right 08/16/2016  . Postconcussion syndrome 12/30/2014  . Tremor 03/11/2014  . 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 Hiatal hernia 03/21/2013  . Anemia, iron deficiency 01/29/2013  . Spasm of muscle 09/02/2012  . Anemia 08/28/2012  . Mild intermittent asthma 08/27/2012  . Chronic back pain   . Pernicious anemia 10/30/2011  . DEGENERATIVE DISC DISEASE 08/18/2010  . Asthma with bronchitis 07/28/2010  . Recurrent urticaria 07/28/2010    Past Surgical History:  Procedure Laterality Date  . BUNIONECTOMY Left   . colonoscopy  07/09/15  . inguinal hernia 1983  1983  . OPEN REDUCTION INTERNAL FIXATION (ORIF) DISTAL RADIAL FRACTURE Right 08/16/2016   Procedure: OPEN REDUCTION INTERNAL FIXATION (ORIF) DISTAL RADIAL FRACTURE;  Surgeon: Eldred Manges, MD;  Location: [facility] OR;  Service: Orthopedics;  Laterality: Right;     OB History    Gravida  1   Para  1   Term  1   Preterm  0   AB  0   Living  1     SAB      TAB      Ectopic      Multiple      Live Births              Family History  Problem Relation Age of Onset  . Cancer Mother   . Other Mother   . COPD Father   . Asthma Brother   . Cancer Brother        colon  . Heart attack Brother   . Seizures Brother   . Allergic rhinitis Neg Hx   . Angioedema Neg Hx   . Eczema Neg Hx   . Immunodeficiency Neg Hx   . Urticaria Neg Hx     Social History   Tobacco Use  . Smoking  status: Never Smoker  . Smokeless tobacco: Never Used  . Tobacco comment: Never Used Tobacco  Vaping Use  . Vaping Use: Never used  Substance Use Topics  . Alcohol use: Not Currently    Alcohol/week: 0.0 standard drinks    Comment: Occasional  . Drug use: Not Currently    Types: Cocaine    Home Medications Prior to Admission medications   Medication Sig Start Date End Date Taking? Authorizing Provider  albuterol (VENTOLIN HFA) 108 (90 Base) MCG/ACT inhaler TAKE 2 PUFFS BY MOUTH EVERY 6 HOURS AS NEEDED FOR WHEEZE OR SHORTNESS OF BREATH 09/21/20   Arnette FeltsMoore, Janece, FNP  Ascorbic Acid (VITAMIN C) 1000 MG tablet Take 1,000 mg by mouth daily.    [provider]  baclofen (LIORESAL) 20 MG tablet TAKE 1 AND 1/2 TABLETS BY  MOUTH IN THE MORNING AND IN THE EVENING AND 1 TABLET AT MIDDAY 10/08/19   York SpanielWillis, Charles K, MD  cetirizine (ZYRTEC) 10 MG tablet Take 1 tablet (10 mg total) by mouth daily. 06/17/20   Arnette FeltsMoore, Janece, FNP  Cholecalciferol (VITAMIN D3) 10 MCG (400 UNIT) tablet Take 1 each by mouth daily.     [provider]  Cranberry-Vitamin C-Vitamin E (CRANBERRY PLUS VITAMIN C) 4200-20-3 MG-MG-UNIT CAPS Take by mouth. Patient not taking: Reported on 07/26/2020    [provider]  cyanocobalamin (,VITAMIN B-12,) 1000 MCG/ML injection Inject 1,000 mcg into the muscle every 3 (three) months.     [provider]  desloratadine (CLARINEX) 5 MG tablet TAKE 1 TABLET BY MOUTH  DAILY 08/12/20   Arnette FeltsMoore, Janece, FNP  diazepam (VALIUM) 10 MG tablet TAKE 2 TABLETS BY MOUTH 3  TIMES DAILY 08/12/20   York SpanielWillis, Charles K, MD  diphenhydrAMINE (BENADRYL) 25 MG tablet Take 25 mg by mouth at bedtime as needed for itching or allergies.     [provider]  doxycycline (VIBRAMYCIN) 100 MG capsule Take 1 capsule (100 mg total) by mouth 2 (two) times daily. 11/07/20   Burky, Dorene GrebeNatalie B, NP  EPINEPHRINE 0.3 mg/0.3 mL IJ SOAJ injection INJECT INTRAMUSCULARLY  0.3ML AS NEEDED FOR   ANAPHYLAXIS . SEEK MEDICAL  ATTENTION AFTER USE. 08/12/20   Arnette FeltsMoore, Janece, FNP  famotidine (PEPCID) 20 MG tablet TAKE 1 TABLET BY MOUTH TWICE A DAY 08/16/20   Arnette FeltsMoore, Janece, FNP  FLUoxetine (PROZAC) 10 MG capsule TAKE 1 CAPSULE BY MOUTH  DAILY 05/05/20   Glean SalvoSlack, Sarah J, NP  gabapentin (NEURONTIN) 300 MG  capsule TAKE 1 CAPSULE BY MOUTH TWO TIMES DAILY Patient taking differently: Take 300 mg by mouth 2 (two) times daily. TAKE 1 CAPSULE BY MOUTH TWO TIMES DAILY 06/22/20   Glean Salvo, NP  glipiZIDE (GLUCOTROL) 5 MG tablet TAKE 1 TABLET BY MOUTH EVERY DAY BEFORE MEALS 08/24/20   Arnette Felts, FNP  Glucose Blood (ACCU-CHEK SIMPLICITY TEST STRP VI) by In Vitro route. Insert 1 by subcutaneous route 4 times every day check blood sugar before breakfast, lunch and dinner and bedtime    [provider]  hydrOXYzine (VISTARIL) 100 MG capsule TAKE 1 CAPSULE (100 MG TOTAL) BY MOUTH 3 (THREE) TIMES DAILY AS NEEDED FOR ITCHING. 09/21/20   Arnette Felts, FNP  metFORMIN (GLUCOPHAGE) 500 MG tablet TAKE 2 TABLETS (1,000 MG TOTAL) BY MOUTH 2 (TWO) TIMES DAILY WITH A MEAL. 05/21/20   Arnette Felts, FNP  metroNIDAZOLE (FLAGYL) 500 MG tablet Take 1 tablet (500 mg total) by mouth 2 (two) times daily. 09/21/20   Brock Bad, MD  Olopatadine HCl (PAZEO) 0.7 % SOLN Apply 1 drop to eye as needed.     [provider]  predniSONE (DELTASONE) 20 MG tablet Take 2 tablets (40 mg total) by mouth daily with breakfast for 5 days. 11/07/20 11/12/20  Georgetta Haber, NP  vitamin E (VITAMIN E) 400 UNIT capsule Take 400 Units by mouth daily.     [provider]    Allergies    Ibuprofen, Lemon flavor, Amoxicillin, and Tylenol [acetaminophen]  Review of Systems   Review of Systems  Constitutional: Negative for appetite change, chills and fever.  HENT: Negative for ear pain, rhinorrhea, sneezing and sore throat.   Eyes: Negative for photophobia and visual disturbance.  Respiratory: Positive for cough.  Negative for chest tightness, shortness of breath and wheezing.   Cardiovascular: Negative for chest pain and palpitations.  Gastrointestinal: Positive for nausea and vomiting. Negative for abdominal pain, blood in stool, constipation and diarrhea.  Endocrine: Positive for polydipsia and polyuria.  Genitourinary: Negative for dysuria, hematuria and urgency.  Musculoskeletal: Negative for myalgias.  Skin: Negative for rash.  Neurological: Negative for dizziness, weakness and light-headedness.    Physical Exam Updated Vital Signs BP (!) 116/92   Pulse 100   Temp 97.9 F (36.6 C) (Oral)   Resp 16   Ht  (1.6 m)   Wt 41.7 kg   LMP 07/29/2012 (Approximate)   SpO2 100%   BMI 16.30 kg/m   Physical Exam Vitals and nursing note reviewed.  Constitutional:      General: She is not in acute distress.    Appearance: She is well-developed.  HENT:     Head: Normocephalic and atraumatic.     Nose: Nose normal.  Eyes:     General: No scleral icterus.       Left eye: No discharge.     Conjunctiva/sclera: Conjunctivae normal.  Cardiovascular:     Rate and Rhythm: Normal rate and regular rhythm.     Heart sounds: Normal heart sounds. No murmur heard.  No friction rub. No gallop.   Pulmonary:     Effort: Pulmonary effort is normal. No respiratory distress.     Breath sounds: Normal breath sounds.  Abdominal:     General: Bowel sounds are normal. There is no distension.     Palpations: Abdomen is soft.     Tenderness: There is no abdominal tenderness. There is no guarding.  Musculoskeletal:        General: Normal  range of motion.     Cervical back: Normal range of motion and neck supple.  Skin:    General: Skin is warm and dry.     Findings: No rash.  Neurological:     Mental Status: She is alert.     Motor: No abnormal muscle tone.     Coordination: Coordination normal.     ED Results / Procedures / Treatments   Labs (all labs ordered are listed, but only abnormal results  are displayed) Labs Reviewed  BASIC METABOLIC PANEL - Abnormal; Notable for the following components:      Result Value   Potassium 6.0 (*)    Glucose, Bld 311 (*)    BUN 23 (*)    Creatinine, Ser 1.10 (*)    GFR, Estimated 59 (*)    All other components within normal limits  CBG MONITORING, ED - Abnormal; Notable for the following components:   Glucose-Capillary 288 (*)    All other components within normal limits  CBC WITH DIFFERENTIAL/PLATELET  BASIC METABOLIC PANEL    EKG None  Radiology DG Chest 2 View  Result Date: 11/12/2020 CLINICAL DATA:  54 year old female with cough, hyperglycemia. EXAM: CHEST - 2 VIEW COMPARISON:  Chest radiographs 02/08/2018 and earlier. FINDINGS: Semi upright AP and lateral views of the chest. Larger lung volumes since 2019, with a degree of pulmonary hyperinflation suspected now. Mediastinal contours remain within normal limits. Visualized tracheal air column is within normal limits. No pneumothorax, pulmonary edema, pleural effusion or confluent pulmonary opacity. There is a small chronic retrocardiac gastric hernia. Chronic dextroconvex scoliosis. No acute osseous abnormality identified. Negative visible bowel gas pattern. IMPRESSION: 1. Pulmonary hyperinflation, new since 2019 but no acute cardiopulmonary abnormality. 2. Chronic small gastric hernia. Electronically Signed   By: Odessa Fleming M.D.   On: 11/12/2020 15:49    Procedures Procedures (including critical care time)  Medications Ordered in ED Medications  insulin aspart (novoLOG) injection 5 Units (5 Units Intravenous Given 11/12/20 1607)  calcium gluconate 1 g/ 50 mL sodium chloride IVPB (0 g Intravenous Stopped 11/12/20 1653)    ED Course  I have reviewed the triage vital signs and the nursing notes.  Pertinent labs & imaging results that were available during my care of the patient were reviewed by me and considered in my medical decision making (see chart for details).  Clinical Course  as of Nov 12 1744  Fri Nov 12, 2020  1620 Will give insulin and calcium gluconate.  Potassium(!): 6.0 [HK]    Clinical Course User Index [HK] Dietrich Pates, PA-C   MDM Rules/Calculators/A&P                          54 year old female with past medical history of substance abuse, diabetes, CKD presenting to the ED with a chief complaint of hyperglycemia.  On the 14th was seen and evaluated at urgent care for productive cough and wheezing, placed on prednisone burst and doxycycline.  She has been taking these medications as prescribed up until yesterday.  She is concerned that her sugars were running high because of it.  Reports sugars up to the 500s yesterday and 400s today.  She admits that she is not always compliant with taking her Metformin twice a day.  She reports polyuria and polydipsia.  She does report increased sugar intake as well.  On exam lungs are clear to auscultation bilaterally.  Abdomen is soft, nontender nondistended.  Vital signs within  normal limits.  Lab work significant for blood sugar of 311, hyperkalemia at 6.  Chest x-ray shows chronic findings without any acute abnormalities.  Will give insulin, calcium and recheck potassium. Care handed off to oncoming provider pending recheck. Anticipate that she will be able to be discharged home if her potassium level improves.  Portions of this note were generated with Scientist, clinical (histocompatibility and immunogenetics). Dictation errors may occur despite best attempts at proofreading.  Final Clinical Impression(s) / ED Diagnoses Final diagnoses:  Hyperglycemia  Hyperkalemia    Rx / DC Orders ED Discharge Orders    None       Dietrich Pates, PA-C 11/12/20 1750    Derwood Kaplan, MD 11/13/20 1354

## 2020-11-12 NOTE — ED Triage Notes (Signed)
Pt BIB EMS. Pt complaining of unable to control blood glucose at home along with dry mouth and increased thirst. Pt states she was seen on 11/14 at the urgent care for a cough and was given prednisone to take which has increased her blood glucose. Pt stated she had a reading of 500; EMS initial reading was 434; then decreased to 403 after fluids. Hx of DM.    BP-118/70 HR-90 Temp-98.1 O2-100% RA

## 2020-11-12 NOTE — Discharge Instructions (Addendum)
Your blood sugar was likely high due to your prednisone. Follow-up with your primary care provider regarding your medications.  It could also be elevated if you do not take your diabetes medications like you are supposed to.

## 2020-11-16 ENCOUNTER — Inpatient Hospital Stay: Payer: Medicare Other | Attending: Hematology & Oncology

## 2020-11-16 ENCOUNTER — Inpatient Hospital Stay: Payer: Medicare Other

## 2020-11-16 ENCOUNTER — Inpatient Hospital Stay (HOSPITAL_BASED_OUTPATIENT_CLINIC_OR_DEPARTMENT_OTHER): Payer: Medicare Other | Admitting: Family

## 2020-11-16 ENCOUNTER — Encounter: Payer: Self-pay | Admitting: Family

## 2020-11-16 ENCOUNTER — Other Ambulatory Visit: Payer: Self-pay

## 2020-11-16 ENCOUNTER — Other Ambulatory Visit: Payer: Medicare Other

## 2020-11-16 ENCOUNTER — Ambulatory Visit: Payer: Self-pay

## 2020-11-16 ENCOUNTER — Ambulatory Visit: Payer: Medicare Other

## 2020-11-16 ENCOUNTER — Ambulatory Visit: Payer: Medicare Other | Admitting: Hematology & Oncology

## 2020-11-16 VITALS — BP 96/65 | HR 109 | Temp 98.4°F | Resp 19 | Ht 63.0 in | Wt 88.8 lb

## 2020-11-16 DIAGNOSIS — E119 Type 2 diabetes mellitus without complications: Secondary | ICD-10-CM

## 2020-11-16 DIAGNOSIS — D509 Iron deficiency anemia, unspecified: Secondary | ICD-10-CM | POA: Insufficient documentation

## 2020-11-16 DIAGNOSIS — D510 Vitamin B12 deficiency anemia due to intrinsic factor deficiency: Secondary | ICD-10-CM

## 2020-11-16 DIAGNOSIS — D508 Other iron deficiency anemias: Secondary | ICD-10-CM | POA: Diagnosis not present

## 2020-11-16 DIAGNOSIS — Z86718 Personal history of other venous thrombosis and embolism: Secondary | ICD-10-CM | POA: Diagnosis not present

## 2020-11-16 DIAGNOSIS — Z7984 Long term (current) use of oral hypoglycemic drugs: Secondary | ICD-10-CM | POA: Diagnosis not present

## 2020-11-16 DIAGNOSIS — Z79899 Other long term (current) drug therapy: Secondary | ICD-10-CM | POA: Insufficient documentation

## 2020-11-16 LAB — CMP (CANCER CENTER ONLY)
ALT: 18 U/L (ref 0–44)
AST: 10 U/L — ABNORMAL LOW (ref 15–41)
Albumin: 4 g/dL (ref 3.5–5.0)
Alkaline Phosphatase: 70 U/L (ref 38–126)
Anion gap: 10 (ref 5–15)
BUN: 12 mg/dL (ref 6–20)
CO2: 26 mmol/L (ref 22–32)
Calcium: 10.1 mg/dL (ref 8.9–10.3)
Chloride: 99 mmol/L (ref 98–111)
Creatinine: 0.99 mg/dL (ref 0.44–1.00)
GFR, Estimated: >60 mL/min (ref >60)
Glucose, Bld: 262 mg/dL — ABNORMAL HIGH (ref 70–99)
Potassium: 4.3 mmol/L (ref 3.5–5.1)
Sodium: 135 mmol/L (ref 135–145)
Total Bilirubin: 0.2 mg/dL — ABNORMAL LOW (ref 0.3–1.2)
Total Protein: 7 g/dL (ref 6.5–8.1)

## 2020-11-16 LAB — CBC WITH DIFFERENTIAL (CANCER CENTER ONLY)
Abs Immature Granulocytes: 0.06 10*3/uL (ref 0.00–0.07)
Basophils Absolute: 0 10*3/uL (ref 0.0–0.1)
Basophils Relative: 0 %
Eosinophils Absolute: 0 10*3/uL (ref 0.0–0.5)
Eosinophils Relative: 0 %
HCT: 37.1 % (ref 36.0–46.0)
Hemoglobin: 12.4 g/dL (ref 12.0–15.0)
Immature Granulocytes: 1 %
Lymphocytes Relative: 24 %
Lymphs Abs: 1.8 10*3/uL (ref 0.7–4.0)
MCH: 28.5 pg (ref 26.0–34.0)
MCHC: 33.4 g/dL (ref 30.0–36.0)
MCV: 85.3 fL (ref 80.0–100.0)
Monocytes Absolute: 0.4 10*3/uL (ref 0.1–1.0)
Monocytes Relative: 5 %
Neutro Abs: 5.3 10*3/uL (ref 1.7–7.7)
Neutrophils Relative %: 70 %
Platelet Count: 263 10*3/uL (ref 150–400)
RBC: 4.35 MIL/uL (ref 3.87–5.11)
RDW: 12 % (ref 11.5–15.5)
WBC Count: 7.6 10*3/uL (ref 4.0–10.5)
nRBC: 0 % (ref 0.0–0.2)

## 2020-11-16 LAB — VITAMIN B12: Vitamin B-12: 628 pg/mL (ref 180–914)

## 2020-11-16 LAB — RETICULOCYTES
Immature Retic Fract: 4.7 % (ref 2.3–15.9)
RBC.: 4.32 MIL/uL (ref 3.87–5.11)
Retic Count, Absolute: 58.3 10*3/uL (ref 19.0–186.0)
Retic Ct Pct: 1.4 % (ref 0.4–3.1)

## 2020-11-16 MED ORDER — CYANOCOBALAMIN 1000 MCG/ML IJ SOLN
1000.0000 ug | Freq: Once | INTRAMUSCULAR | Status: AC
  Administered 2020-11-16: 1000 ug via INTRAMUSCULAR

## 2020-11-16 NOTE — Chronic Care Management (AMB) (Signed)
°  Care Management    Consult Note  11/16/2020 Name: Sherri [name] MRN: [id number] DOB: [birth year]  Care management team received notification of patient's recent emergency department visit related to hyperglycemia .Based on review of health record, [name] is currently active in the embedded care coordination program.   Review of patient status, including review of consultants reports, relevant laboratory and other test results, and collaboration with appropriate care team members and the patient's provider was performed as part of comprehensive patient evaluation and provision of chronic care management services.     Plan: Collaboration with RN Care Manager to inform of recent ED visit. Next RN CM appointment scheduled for 11/24/20.  [name], BSW, CDP Social Worker, Certified Dementia Practitioner [facility] Care Management [phone]

## 2020-11-16 NOTE — Patient Instructions (Signed)

## 2020-11-16 NOTE — Progress Notes (Signed)
Hematology and Oncology Follow Up Visit  Sherri [name] [id number] 01/05/1966 54 y.o. 11/16/2020   Principle Diagnosis:  Pernicious anemia - anti-intrinsic factor antibodies Intermittent iron - deficiency anemia History of deep venous thrombosis of the right leg  Current Therapy: Vitamin B12, 1 mg IMQ59months IV iron as indicated   Interim History:  Sherri [name] is here today for follow-up and B 12 injection. She is getting over a bout with bronchitis. She was given doxycycline and prednisone but had hyperglycemia with the steroid. She went to the ED and they were able to get her blood sugar, which was > 500, down with insulin.  She is slowly feeling better but is still having fatigue.  She has a dry cough if she talks for a bit and her throat gets dry.  No fever, chills, n/v, rash, dizziness, SOB, chest pain, palpitations, abdominal pain or changes in bowel or bladder habits.  No swelling, tenderness, numbness or tingling in her extremities.  No falls or syncopal episodes to report. She ambulates with a cane for added support.  She has a good appetite and supplements with Glucerna when needed. She is doing her best to stay hydrated. She states that her weight had dropped to 80 lbs and is now back up to 88 lbs.   ECOG Performance Status: 1 - Symptomatic but completely ambulatory  Medications:  Allergies as of 11/16/2020      Reactions   Ibuprofen Other (See Comments)   Does not take due to hx of renal insufficiency "I have kidney disease"    Lemon Flavor Swelling   Severe Lip Swelling  FRUIT per pt.     Amoxicillin Diarrhea, Other (See Comments)   Tylenol [acetaminophen] Hives   Cannot take large quantities      Medication List       Accurate as of November 16, 2020  3:08 PM. If you have any questions, ask your nurse or doctor.        ACCU-CHEK SIMPLICITY TEST STRP VI by In Vitro route. Insert 1 by subcutaneous route 4 times every day check blood sugar before  breakfast, lunch and dinner and bedtime   albuterol 108 (90 Base) MCG/ACT inhaler Commonly known as: VENTOLIN HFA TAKE 2 PUFFS BY MOUTH EVERY 6 HOURS AS NEEDED FOR WHEEZE OR SHORTNESS OF BREATH   baclofen 20 MG tablet Commonly known as: LIORESAL TAKE 1 AND 1/2 TABLETS BY  MOUTH IN THE MORNING AND IN THE EVENING AND 1 TABLET AT MIDDAY   cetirizine 10 MG tablet Commonly known as: ZYRTEC Take 1 tablet (10 mg total) by mouth daily.   Cranberry Plus Vitamin C 4200-20-3 MG-MG-UNIT Caps Generic drug: Cranberry-Vitamin C-Vitamin E Take by mouth.   cyanocobalamin 1000 MCG/ML injection Commonly known as: (VITAMIN B-12) Inject 1,000 mcg into the muscle every 3 (three) months.   desloratadine 5 MG tablet Commonly known as: CLARINEX TAKE 1 TABLET BY MOUTH  DAILY   diazepam 10 MG tablet Commonly known as: VALIUM TAKE 2 TABLETS BY MOUTH 3  TIMES DAILY   diphenhydrAMINE 25 MG tablet Commonly known as: BENADRYL Take 25 mg by mouth at bedtime as needed for itching or allergies.   doxycycline 100 MG capsule Commonly known as: VIBRAMYCIN Take 1 capsule (100 mg total) by mouth 2 (two) times daily.   EPINEPHrine 0.3 mg/0.3 mL Soaj injection Commonly known as: EPI-PEN INJECT INTRAMUSCULARLY  0.3ML AS NEEDED FOR  ANAPHYLAXIS . SEEK MEDICAL  ATTENTION AFTER USE.   famotidine 20 MG tablet Commonly  known as: PEPCID TAKE 1 TABLET BY MOUTH TWICE A DAY   FLUoxetine 10 MG capsule Commonly known as: PROZAC TAKE 1 CAPSULE BY MOUTH  DAILY   gabapentin 300 MG capsule Commonly known as: NEURONTIN TAKE 1 CAPSULE BY MOUTH TWO TIMES DAILY What changed:   how much to take  how to take this  when to take this   glipiZIDE 5 MG tablet Commonly known as: GLUCOTROL TAKE 1 TABLET BY MOUTH EVERY DAY BEFORE MEALS   hydrOXYzine 100 MG capsule Commonly known as: VISTARIL TAKE 1 CAPSULE (100 MG TOTAL) BY MOUTH 3 (THREE) TIMES DAILY AS NEEDED FOR ITCHING.   metFORMIN 500 MG tablet Commonly known  as: GLUCOPHAGE TAKE 2 TABLETS (1,000 MG TOTAL) BY MOUTH 2 (TWO) TIMES DAILY WITH A MEAL.   metroNIDAZOLE 500 MG tablet Commonly known as: FLAGYL Take 1 tablet (500 mg total) by mouth 2 (two) times daily.   Pazeo 0.7 % Soln Generic drug: Olopatadine HCl Apply 1 drop to eye as needed.   vitamin C 1000 MG tablet Take 1,000 mg by mouth daily.   Vitamin D3 10 MCG (400 UNIT) tablet Take 1 each by mouth daily.   vitamin E 180 MG (400 UNITS) capsule Generic drug: vitamin E Take 400 Units by mouth daily.       Allergies:  Allergies  Allergen Reactions  . Ibuprofen Other (See Comments)    Does not take due to hx of renal insufficiency "I have kidney disease"   . Lemon Flavor Swelling    Severe Lip Swelling  FRUIT per pt.    . Amoxicillin Diarrhea and Other (See Comments)  . Tylenol [Acetaminophen] Hives    Cannot take large quantities    Past Medical History, Surgical history, Social history, and Family History were reviewed and updated.  Review of Systems: All other 10 point review of systems is negative.   Physical Exam:  vitals were not taken for this visit.   Wt Readings from Last 3 Encounters:  11/12/20 92 lb (41.7 kg)  10/02/20 93 lb (42.2 kg)  09/21/20 93 lb (42.2 kg)    Ocular: Sclerae unicteric, pupils equal, round and reactive to light Ear-nose-throat: Oropharynx clear, dentition fair Lymphatic: No cervical or supraclavicular adenopathy Lungs no rales or rhonchi, good excursion bilaterally Heart regular rate and rhythm, no murmur appreciated Abd soft, nontender, positive bowel sounds MSK no focal spinal tenderness, no joint edema Neuro: non-focal, well-oriented, appropriate affect Breasts: Deferred   Lab Results  Component Value Date   WBC 7.6 11/16/2020   HGB 12.4 11/16/2020   HCT 37.1 11/16/2020   MCV 85.3 11/16/2020   PLT 263 11/16/2020   Lab Results  Component Value Date   FERRITIN 622 (H) 07/27/2020   IRON 72 07/27/2020   TIBC 244  07/27/2020   UIBC 172 07/27/2020   IRONPCTSAT 30 07/27/2020   Lab Results  Component Value Date   RETICCTPCT 1.4 11/16/2020   RBC 4.32 11/16/2020   RETICCTABS 71.8 12/14/2015   Lab Results  Component Value Date   KPAFRELGTCHN 1.06 08/02/2011   LAMBDASER 1.49 08/02/2011   KAPLAMBRATIO 0.71 08/02/2011   Lab Results  Component Value Date   IGGSERUM 1180 08/02/2011   IGA 206 08/02/2011   IGMSERUM 198 08/02/2011   Lab Results  Component Value Date   TOTALPROTELP 6.6 08/02/2011   ALBUMINELP 57.1 08/02/2011   A1GS 4.1 08/02/2011   A2GS 9.6 08/02/2011   BETS 6.1 08/02/2011   BETA2SER 5.9 08/02/2011   GAMS  17.2 08/02/2011   MSPIKE NOT DET 08/02/2011   SPEI * 08/02/2011     Chemistry      Component Value Date/Time   NA 134 (L) 11/12/2020 1721   NA 141 06/03/2020 1700   NA 136 11/23/2017 1147   NA 144 03/22/2017 0943   K 4.3 11/12/2020 1721   K 3.9 11/23/2017 1147   K 4.2 03/22/2017 0943   CL 101 11/12/2020 1721   CL 94 (L) 11/23/2017 1147   CO2 22 11/12/2020 1721   CO2 30 11/23/2017 1147   CO2 29 03/22/2017 0943   BUN 22 (H) 11/12/2020 1721   BUN 15 06/03/2020 1700   BUN 10 11/23/2017 1147   BUN 11.1 03/22/2017 0943   CREATININE 1.00 11/12/2020 1721   CREATININE 1.16 (H) 07/27/2020 1316   CREATININE 1.0 11/23/2017 1147   CREATININE 1.0 03/22/2017 0943   GLU 88 09/05/2018 1535      Component Value Date/Time   CALCIUM 9.0 11/12/2020 1721   CALCIUM 10.1 11/23/2017 1147   CALCIUM 9.9 03/22/2017 0943   ALKPHOS 72 07/27/2020 1316   ALKPHOS 174 (H) 11/23/2017 1147   ALKPHOS 114 03/22/2017 0943   AST 15 07/27/2020 1316   AST 18 03/22/2017 0943   ALT 21 07/27/2020 1316   ALT 46 11/23/2017 1147   ALT 23 03/22/2017 0943   BILITOT 0.2 (L) 07/27/2020 1316   BILITOT 0.24 03/22/2017 0943       Impression and Plan: Sherri [name] is a pleasant 54yo African American female with both B 12 and intermittent iron deficiency. She received her B 12 injection today.  Iron  studies are pending and we will replace if needed.  Follow-up in 3 months.  She was encouraged to contact our office with any questions or concerns.   [name], NP 11/23/20213:08 PM

## 2020-11-17 ENCOUNTER — Telehealth: Payer: Self-pay | Admitting: *Deleted

## 2020-11-17 ENCOUNTER — Telehealth: Payer: Self-pay | Admitting: Family

## 2020-11-17 LAB — IRON AND TIBC
Iron: 80 ug/dL (ref 41–142)
Saturation Ratios: 43 % (ref 21–57)
TIBC: 188 ug/dL — ABNORMAL LOW (ref 236–444)
UIBC: 108 ug/dL — ABNORMAL LOW (ref 120–384)

## 2020-11-17 LAB — FERRITIN: Ferritin: 702 ng/mL — ABNORMAL HIGH (ref 11–307)

## 2020-11-17 NOTE — Telephone Encounter (Signed)
Message received from patient requesting lab results from 11/16/20.  Results reviewed by S. [place] NP and call placed back to patient to notify her that Vit B-12, iron saturation and ferritin results look good and nothing more is needed at this time. Pt appreciative of call and has no further questions at this time.

## 2020-11-17 NOTE — Telephone Encounter (Signed)
Appointments scheduled calendar printed & mailed per 11/23 los 

## 2020-11-23 ENCOUNTER — Other Ambulatory Visit: Payer: Self-pay | Admitting: Nurse Practitioner

## 2020-11-23 ENCOUNTER — Ambulatory Visit: Payer: Self-pay

## 2020-11-23 DIAGNOSIS — J45909 Unspecified asthma, uncomplicated: Secondary | ICD-10-CM

## 2020-11-23 DIAGNOSIS — E119 Type 2 diabetes mellitus without complications: Secondary | ICD-10-CM

## 2020-11-23 DIAGNOSIS — Z8709 Personal history of other diseases of the respiratory system: Secondary | ICD-10-CM

## 2020-11-23 DIAGNOSIS — R634 Abnormal weight loss: Secondary | ICD-10-CM
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
## 2020-11-24 ENCOUNTER — Telehealth: Payer: Self-pay

## 2020-11-24 ENCOUNTER — Telehealth: Payer: Medicare Other

## 2020-11-24 NOTE — Telephone Encounter (Cosign Needed)
  Chronic Care Management   Outreach Note  11/24/2020 Name: Sherri [name] MRN: [id number] DOB: 24-Nov-1966  Referred by: Arnette Felts, FNP Reason for referral : Chronic Care Management (RNCM FU Call )   An unsuccessful telephone outreach was attempted today. The patient was referred to the case management team for assistance with care management and care coordination.   Follow Up Plan: A HIPAA compliant phone message was left for the patient providing contact information and requesting a return call. Telephone follow up appointment with care management team member scheduled for: 01/05/21  Delsa Sale, RN, BSN, CCM Care Management Coordinator [facility] Care Management/[facility]  Direct Phone: [phone]

## 2020-11-25 ENCOUNTER — Telehealth: Payer: Self-pay

## 2020-11-25 NOTE — Chronic Care Management (AMB) (Signed)
Chronic Care Management Pharmacy Assistant   Name: Sherri [name]  MRN: [id number] DOB: 1966/09/14  Reason for Encounter: Disease State -Diabetes Adherence Call.   Patient Questions:  1.  Have you seen any other providers since your last visit? Yes. 09/21/2020 Sherri [name] ,MD OBGYN  10/02/2020 ED-Sherri [name] ,MD  11/07/2020 ED - Urgent Care  11/12/2020 ED - Derwood Kaplan ,MD Hyperglycemia  11/16/2020 Sherri Gins, NP Oncology   2.  Any changes in your medicines or health? Patient has been to ED because of Hyperglycemia .   Sherri [name]  PCP : Arnette Felts, FNP  Allergies:   Allergies  Allergen Reactions   Ibuprofen Other (See Comments)    Does not take due to hx of renal insufficiency "I have kidney disease"    Lemon Flavor Swelling    Severe Lip Swelling  FRUIT per pt.     Amoxicillin Diarrhea and Other (See Comments)   Tylenol [Acetaminophen] Hives    Cannot take large quantities    Medications: Outpatient Encounter Medications as of 11/25/2020  Medication Sig Note   albuterol (VENTOLIN HFA) 108 (90 Base) MCG/ACT inhaler TAKE 2 PUFFS BY MOUTH EVERY 6 HOURS AS NEEDED FOR WHEEZE OR SHORTNESS OF BREATH    Ascorbic Acid (VITAMIN C) 1000 MG tablet Take 1,000 mg by mouth daily.    baclofen (LIORESAL) 20 MG tablet TAKE 1 AND 1/2 TABLETS BY  MOUTH IN THE MORNING AND IN THE EVENING AND 1 TABLET AT MIDDAY    cetirizine (ZYRTEC) 10 MG tablet Take 1 tablet (10 mg total) by mouth daily.    Cholecalciferol (VITAMIN D3) 10 MCG (400 UNIT) tablet Take 1 each by mouth daily.     Cranberry-Vitamin C-Vitamin E (CRANBERRY PLUS VITAMIN C) 4200-20-3 MG-MG-UNIT CAPS Take by mouth.     cyanocobalamin (,VITAMIN B-12,) 1000 MCG/ML injection Inject 1,000 mcg into the muscle every 3 (three) months.     desloratadine (CLARINEX) 5 MG tablet TAKE 1 TABLET BY MOUTH  DAILY    diazepam (VALIUM) 10 MG tablet TAKE 2 TABLETS BY MOUTH 3  TIMES DAILY    diphenhydrAMINE (BENADRYL) 25 MG  tablet Take 25 mg by mouth at bedtime as needed for itching or allergies.  (Patient not taking: Reported on 11/16/2020) 01/17/2017: Takes as needed   EPINEPHRINE 0.3 mg/0.3 mL IJ SOAJ injection INJECT INTRAMUSCULARLY  0.3ML AS NEEDED FOR  ANAPHYLAXIS . SEEK MEDICAL  ATTENTION AFTER USE. (Patient not taking: Reported on 11/16/2020)    famotidine (PEPCID) 20 MG tablet TAKE 1 TABLET BY MOUTH TWICE A DAY    FLUoxetine (PROZAC) 10 MG capsule TAKE 1 CAPSULE BY MOUTH  DAILY    gabapentin (NEURONTIN) 300 MG capsule TAKE 1 CAPSULE BY MOUTH TWO TIMES DAILY (Patient taking differently: Take 300 mg by mouth 2 (two) times daily. TAKE 1 CAPSULE BY MOUTH TWO TIMES DAILY)    glipiZIDE (GLUCOTROL) 5 MG tablet TAKE 1 TABLET BY MOUTH EVERY DAY BEFORE MEALS    Glucose Blood (ACCU-CHEK SIMPLICITY TEST STRP VI) by In Vitro route. Insert 1 by subcutaneous route 4 times every day check blood sugar before breakfast, lunch and dinner and bedtime    hydrOXYzine (VISTARIL) 100 MG capsule TAKE 1 CAPSULE (100 MG TOTAL) BY MOUTH 3 (THREE) TIMES DAILY AS NEEDED FOR ITCHING. (Patient not taking: Reported on 11/16/2020)    metFORMIN (GLUCOPHAGE) 500 MG tablet TAKE 2 TABLETS (1,000 MG TOTAL) BY MOUTH 2 (TWO) TIMES DAILY WITH A MEAL.    metroNIDAZOLE (FLAGYL) 500 MG  tablet Take 1 tablet (500 mg total) by mouth 2 (two) times daily.    Olopatadine HCl (PAZEO) 0.7 % SOLN Apply 1 drop to eye as needed.     vitamin E (VITAMIN E) 400 UNIT capsule Take 400 Units by mouth daily.     No facility-administered encounter medications on file as of 11/25/2020.    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   Hiatal hernia 03/21/2013   Anemia, iron deficiency 01/29/2013   Spasm of muscle 09/02/2012   Anemia 08/28/2012   Mild intermittent asthma 08/27/2012   Chronic back pain    Pernicious anemia 10/30/2011   DEGENERATIVE DISC DISEASE 08/18/2010   Asthma with bronchitis 07/28/2010   Recurrent urticaria 07/28/2010    Goals Addressed            This Visit's Progress    Pharmacy Care Plan   Not on track    CARE PLAN ENTRY (see longitudinal plan of care for additional care plan information)  Current Barriers:   Chronic Disease Management support, education, and care coordination needs related to Diabetes, Asthma, and Stiff Person Syndrome   Diabetes Lab Results  Component Value Date/Time   HGBA1C 6.4 (H) 06/03/2020 05:00 PM   HGBA1C 8.5 (H) 01/15/2020 12:12 PM    Pharmacist Clinical Goal(s): o Over the next 180 days, patient will work with PharmD and providers to maintain A1c goal <7%  Current regimen:   Glipizide 5mg  daily   Metformin 500mg  2 tablets twice daily with a meal  Interventions: o Provided dietary and exercise recommendations  Patient self care activities - Over the next 180 days, patient will: o Check blood sugar three times weekly, document, and provide at future appointments o Contact provider with any episodes of hypoglycemia o Focus on eating well-balanced meals (PLATE method) o Try to exercise for 30 minutes daily 5 times per week  Stiff Person Syndrome  Pharmacist Clinical Goal(s) o Over the next 90 days, patient will work with PharmD and providers to control symptoms of stiff man syndrome  Current regimen:   Baclofen 20mg  1.5 tablets in the morning and evening and 1 tablet midday  Diazepam 10mg  2 tablets three times daily  Gabapentin 300mg  2 capsules twice daily  Interventions: o Provided patient education  regarding onset of action of medications and that sleeping does not help to wear off medications  Patient self care activities - Over the next 90 days, patient will: o Take medications daily as directed o Monitor for signs and symptoms of CNS depression (drowsiness, respiratory depression , etc)  Asthma/Allergies  Pharmacist Clinical Goal(s) o Over the next 180 days, patient will work with PharmD and providers to control symptoms of asthma and allergies  Current regimen:   Cetirizine 10mg  daily  Desloratadine 5mg  daily  Diphenhydramine 25mg  at bedtime as needed for itching or allergies  Hydroxyzine pamoate 100mg  three times daily as needed for itching  Olopatadine 0.7% 1 drop to eye as needed  Epipen as needed for anaphylaxis  Ventolin HFA every 6 hours as needed  for wheezing or SOB  Interventions: o Discussed proper storage of Epipen and expiration date o Provided patient education regarding same active ingredients present in Proair and Ventolin  Patient self care activities - Over the next 180 days, patient will: o Use medication daily as directed o Avoid triggers if possible  Medication management  Pharmacist Clinical Goal(s): o Over the next 180 days, patient will work with PharmD and providers to maintain optimal medication adherence  Current pharmacy: [facility]  Interventions o Comprehensive medication review performed. o Continue current medication management strategy  Patient self care activities - Over the next 180 days, patient will: o Focus on medication adherence by utilizing a pill box o Take medications as prescribed o Report any questions or concerns to PharmD and/or provider(s)  Initial goal documentation       Recent Relevant Labs: Lab Results  Component Value Date/Time   HGBA1C 6.4 (H) 06/03/2020 05:00 PM   HGBA1C 8.5 (H) 01/15/2020 12:12 PM   MICROALBUR 30 06/04/2020 12:29 PM    Kidney Function Lab Results  Component Value Date/Time    CREATININE 0.99 11/16/2020 02:47 PM   CREATININE 1.00 11/12/2020 05:21 PM   CREATININE 1.10 (H) 11/12/2020 02:50 PM   CREATININE 1.16 (H) 07/27/2020 01:16 PM   CREATININE 1.0 11/23/2017 11:47 AM   CREATININE 1.0 07/23/2017 11:30 AM   CREATININE 1.0 03/22/2017 09:43 AM   CREATININE 1.2 (H) 08/24/2016 10:04 AM   GFRNONAA >60 11/16/2020 02:47 PM   GFRAA >60 07/27/2020 01:16 PM     Current antihyperglycemic regimen:   Glipizide 5 mg daily   Metformin 500 mg 2 tablets twice daily with a meal   What recent interventions/DTP's have been made to improve glycemic control:  o Patient stated she has been taking her medication as directed by provider.   Have there been any recent hospitalizations or ED visits since last visit with CPP? Yes    Patient denies hypoglycemic symptoms, including Pale, Sweaty, Shaky, Hungry, Nervous/irritable and Vision changes  Patient denies hyperglycemic symptoms, including blurry vision, excessive thirst, fatigue, polyuria and weakness    How often are you checking your blood sugar?  Patient stated she has not been taking blood sugar since she went to hospital . Patient states she has lost her glucose machine, she just found it while she was on phone with me.   What are your blood sugars ranging? 12/02/2020 - 301 while on phone stated it was high because she had ate a piece of coconut cake. o Fasting: None o Before meals: None o After meals: None o Bedtime: None  During the week, how often does your blood glucose drop below 70? Never  Are you checking your feet daily/regularly? Yes. Patient denies any open sores, numbness or pain.  Adherence Review: Is the patient currently on a STATIN medication? No Is the patient currently on ACE/ARB medication? No Does the patient have >5 day gap between last estimated fill dates? No     Follow-Up:  Pharmacist Review   Vallie Pearson,CPP notified  Jon Gills, [facility] Clinical Pharmacist  Assistant [phone]

## 2020-12-07 ENCOUNTER — Ambulatory Visit: Payer: Medicare Other

## 2020-12-07 DIAGNOSIS — J45909 Unspecified asthma, uncomplicated: Secondary | ICD-10-CM

## 2020-12-07 DIAGNOSIS — E119 Type 2 diabetes mellitus without complications: Secondary | ICD-10-CM

## 2020-12-07 NOTE — Patient Instructions (Signed)
   Goals we discussed today:  Goals Addressed    . Work with SW to manage care coordination needs       Timeframe:  Long-Range Goal Priority:  Low Start Date:  12.14.21                           Expected End Date:  4.13.22                     Next expected date of contact: 2.14.22  Patient will self administer medications as prescribed Patient will attend all scheduled provider appointments Patient will call provider office for new concerns or questions Contact SW as needed prior to next scheduled call

## 2020-12-07 NOTE — Chronic Care Management (AMB) (Signed)
Chronic Care Management    Social Work Follow Up Note  12/07/2020 Name: Sherri [name] MRN: [id number] DOB: June 07, 1966  Sherri [name] is a 54 y.o. year old female who is a primary care patient of Arnette Felts, FNP. The CCM team was consulted for assistance with care coordination.   Review of patient status, including review of consultants reports, other relevant assessments, and collaboration with appropriate care team members and the patient's provider was performed as part of comprehensive patient evaluation and provision of chronic care management services.    SDOH (Social Determinants of Health) assessments performed: No    Outpatient Encounter Medications as of 12/07/2020  Medication Sig Note  . albuterol (VENTOLIN HFA) 108 (90 Base) MCG/ACT inhaler TAKE 2 PUFFS BY MOUTH EVERY 6 HOURS AS NEEDED FOR WHEEZE OR SHORTNESS OF BREATH   . Ascorbic Acid (VITAMIN C) 1000 MG tablet Take 1,000 mg by mouth daily.   . baclofen (LIORESAL) 20 MG tablet TAKE 1 AND 1/2 TABLETS BY  MOUTH IN THE MORNING AND IN THE EVENING AND 1 TABLET AT MIDDAY   . cetirizine (ZYRTEC) 10 MG tablet Take 1 tablet (10 mg total) by mouth daily.   . Cholecalciferol (VITAMIN D3) 10 MCG (400 UNIT) tablet Take 1 each by mouth daily.    . Cranberry-Vitamin C-Vitamin E (CRANBERRY PLUS VITAMIN C) 4200-20-3 MG-MG-UNIT CAPS Take by mouth.    . cyanocobalamin (,VITAMIN B-12,) 1000 MCG/ML injection Inject 1,000 mcg into the muscle every 3 (three) months.    . desloratadine (CLARINEX) 5 MG tablet TAKE 1 TABLET BY MOUTH  DAILY   . diazepam (VALIUM) 10 MG tablet TAKE 2 TABLETS BY MOUTH 3  TIMES DAILY   . diphenhydrAMINE (BENADRYL) 25 MG tablet Take 25 mg by mouth at bedtime as needed for itching or allergies.  (Patient not taking: Reported on 11/16/2020) 01/17/2017: Takes as needed  . EPINEPHRINE 0.3 mg/0.3 mL IJ SOAJ injection INJECT INTRAMUSCULARLY  0.3ML AS NEEDED FOR  ANAPHYLAXIS . SEEK MEDICAL  ATTENTION AFTER USE. (Patient not  taking: Reported on 11/16/2020)   . famotidine (PEPCID) 20 MG tablet TAKE 1 TABLET BY MOUTH TWICE A DAY   . FLUoxetine (PROZAC) 10 MG capsule TAKE 1 CAPSULE BY MOUTH  DAILY   . gabapentin (NEURONTIN) 300 MG capsule TAKE 1 CAPSULE BY MOUTH TWO TIMES DAILY (Patient taking differently: Take 300 mg by mouth 2 (two) times daily. TAKE 1 CAPSULE BY MOUTH TWO TIMES DAILY)   . glipiZIDE (GLUCOTROL) 5 MG tablet TAKE 1 TABLET BY MOUTH EVERY DAY BEFORE MEALS   . Glucose Blood (ACCU-CHEK SIMPLICITY TEST STRP VI) by In Vitro route. Insert 1 by subcutaneous route 4 times every day check blood sugar before breakfast, lunch and dinner and bedtime   . hydrOXYzine (VISTARIL) 100 MG capsule TAKE 1 CAPSULE (100 MG TOTAL) BY MOUTH 3 (THREE) TIMES DAILY AS NEEDED FOR ITCHING. (Patient not taking: Reported on 11/16/2020)   . metFORMIN (GLUCOPHAGE) 500 MG tablet TAKE 2 TABLETS (1,000 MG TOTAL) BY MOUTH 2 (TWO) TIMES DAILY WITH A MEAL.   [name] metroNIDAZOLE (FLAGYL) 500 MG tablet Take 1 tablet (500 mg total) by mouth 2 (two) times daily.   . Olopatadine HCl (PAZEO) 0.7 % SOLN Apply 1 drop to eye as needed.    . vitamin E (VITAMIN E) 400 UNIT capsule Take 400 Units by mouth daily.     No facility-administered encounter medications on file as of 12/07/2020.     Patient Care Plan: Social Work Care Plan  Problem Identified: Care Coordination   Priority: Low  Onset Date: 12/07/2020    Long-Range Goal: Collaborate with RN Care Manager to perform appropriate assessments to assist with care coordination needs   Start Date: 12/07/2020  Expected End Date: 04/06/2021  This Visit's Progress: On track  Priority: Low  Note:   Current Barriers:  . Chronic disease management support and education needs related to DMII and Asthma   . Financial constraints related to costs of daily living . Limited social support . Limited access to food  Social Worker Clinical Goal(s):  [name] Over the next 120 days, patient will work with SW to  identify and address any acute and/or chronic care coordination needs related to the self health management of DMII and Asthma    CCM SW Interventions:  . Inter-disciplinary care team collaboration (see longitudinal plan of care) . Successful outbound call placed to the patient to assist with care coordination needs . Determined the patient continues to receive left overs from her sister to help with food insecurity . Discussed the patient is doing well with no acute SW needs at this time . Discussed long term follow up with SW while patient remains actively involved with  RN Case Manager and Pharmacist  to address care management needs . Scheduled follow up call over the next 90 days Patient Goals/Self-Care Activities . Over the next 90 days, patient will:   - Patient will self administer medications as prescribed Patient will attend all scheduled provider appointments Patient will call provider office for new concerns or questions Contact SW as needed prior to next scheduled call Follow Up Plan:  SW will follow up with the patient over the next 90 days     [name], BSW, CDP Social Worker, Certified Dementia Practitioner [facility] Care Management [phone]  Total time spent performing care coordination and/or care management activities with the patient by phone or face to face = 12 minutes.

## 2020-12-19 ENCOUNTER — Encounter (HOSPITAL_COMMUNITY): Payer: Self-pay

## 2020-12-19 ENCOUNTER — Other Ambulatory Visit: Payer: Self-pay

## 2020-12-19 ENCOUNTER — Emergency Department (HOSPITAL_COMMUNITY)
Admission: EM | Admit: 2020-12-19 | Discharge: 2020-12-19 | Disposition: A | Discharge status: Home / Self Care | Payer: Medicare Other | Attending: Emergency Medicine | Admitting: Emergency Medicine

## 2020-12-19 DIAGNOSIS — S199XXA Unspecified injury of neck, initial encounter: Secondary | ICD-10-CM | POA: Diagnosis not present

## 2020-12-19 DIAGNOSIS — Z7984 Long term (current) use of oral hypoglycemic drugs: Secondary | ICD-10-CM | POA: Diagnosis not present

## 2020-12-19 DIAGNOSIS — N189 Chronic kidney disease, unspecified: Secondary | ICD-10-CM | POA: Insufficient documentation

## 2020-12-19 DIAGNOSIS — J4520 Mild intermittent asthma, uncomplicated: Secondary | ICD-10-CM | POA: Insufficient documentation

## 2020-12-19 DIAGNOSIS — E1122 Type 2 diabetes mellitus with diabetic chronic kidney disease: Secondary | ICD-10-CM | POA: Insufficient documentation

## 2020-12-19 DIAGNOSIS — S0101XA Laceration without foreign body of scalp, initial encounter: Secondary | ICD-10-CM | POA: Diagnosis not present

## 2020-12-19 DIAGNOSIS — E1165 Type 2 diabetes mellitus with hyperglycemia: Secondary | ICD-10-CM | POA: Insufficient documentation

## 2020-12-19 DIAGNOSIS — Z794 Long term (current) use of insulin: Secondary | ICD-10-CM | POA: Insufficient documentation

## 2020-12-19 DIAGNOSIS — Z79899 Other long term (current) drug therapy: Secondary | ICD-10-CM | POA: Diagnosis not present

## 2020-12-19 DIAGNOSIS — S0990XA Unspecified injury of head, initial encounter: Secondary | ICD-10-CM | POA: Diagnosis present

## 2020-12-19 DIAGNOSIS — R739 Hyperglycemia, unspecified: Secondary | ICD-10-CM

## 2020-12-19 LAB — CBG MONITORING, ED: Glucose-Capillary: 477 mg/dL — ABNORMAL HIGH (ref 70–99)

## 2020-12-19 MED ORDER — INSULIN ASPART 100 UNIT/ML ~~LOC~~ SOLN
10.0000 [IU] | Freq: Once | SUBCUTANEOUS | Status: AC
  Administered 2020-12-19: 10 [IU] via SUBCUTANEOUS
  Filled 2020-12-19: qty 0.1
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
## 2020-12-19 NOTE — Discharge Instructions (Addendum)
As discussed, he may use heating pads, warm baths to soothe your aches and pains after the fall.  Your blood sugar was elevated today, you received 10 units of insulin.  Please make sure to continue taking your Metformin as directed.  If you continue to have any pain, these follow-up with your primary care doctor.  Return to the ER for any new or worsening symptoms.

## 2020-12-20 ENCOUNTER — Telehealth: Payer: Self-pay
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
## 2020-12-22 ENCOUNTER — Other Ambulatory Visit: Payer: Self-pay

## 2020-12-22 ENCOUNTER — Ambulatory Visit (INDEPENDENT_AMBULATORY_CARE_PROVIDER_SITE_OTHER): Payer: Medicare Other | Admitting: Neurology

## 2020-12-22 ENCOUNTER — Encounter: Payer: Self-pay | Admitting: Neurology

## 2020-12-22 VITALS — BP 101/60 | HR 97 | Ht 63.0 in | Wt 90.0 lb

## 2020-12-22 DIAGNOSIS — G2582 Stiff-man syndrome: Secondary | ICD-10-CM

## 2020-12-22 DIAGNOSIS — F419 Anxiety disorder, unspecified: Secondary | ICD-10-CM

## 2020-12-22 MED ORDER — BACLOFEN 20 MG PO TABS: ORAL_TABLET | ORAL | 3 refills | Status: DC

## 2020-12-22 MED ORDER — GLIPIZIDE 5 MG PO TABS
ORAL_TABLET | ORAL | 1 refills | Status: DC
Start: 2020-12-22 — End: 2021-05-09

## 2020-12-22 NOTE — Progress Notes (Signed)
I have read the note, and I agree with the clinical assessment and plan.  Addie Cederberg K Ariana Cavenaugh   

## 2020-12-22 NOTE — Patient Instructions (Signed)
For now continue current medications Continue to work with primary care about weight loss See you back in 6 months
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
## 2020-12-23 ENCOUNTER — Telehealth: Payer: Medicare Other

## 2020-12-23 ENCOUNTER — Telehealth: Payer: Self-pay | Admitting: *Deleted

## 2020-12-23 ENCOUNTER — Ambulatory Visit: Payer: Medicare Other | Admitting: Nurse Practitioner

## 2020-12-23 NOTE — Chronic Care Management (AMB) (Signed)
  Care Management   Note  12/23/2020 Name: Sherri [name] MRN: [id number] DOB: [birth year]  [name] is a 54 y.o. year old female who is a primary care patient of Arnette Felts, FNP and is actively engaged with the care management team. I reached out to [name] by phone today to assist with re-scheduling a follow up visit with the Pharmacist  Follow up plan: Unsuccessful telephone outreach attempt made. A HIPAA compliant phone message was left for the patient providing contact information and requesting a return call.  The care management team will reach out to the patient again over the next 7 days.  If patient returns call to provider office, please advise to call Embedded Care Management Care Guide Gwenevere Ghazi at [phone].  Gwenevere Ghazi  Care Guide, Embedded Care Coordination [facility] Management  Direct Dial: [phone]

## 2020-12-27 ENCOUNTER — Ambulatory Visit: Payer: Medicare Other | Admitting: Orthopedic Surgery

## 2020-12-28 ENCOUNTER — Ambulatory Visit (INDEPENDENT_AMBULATORY_CARE_PROVIDER_SITE_OTHER): Payer: Medicare Other

## 2020-12-28 ENCOUNTER — Ambulatory Visit: Payer: Medicare Other | Admitting: Nurse Practitioner

## 2020-12-28 ENCOUNTER — Encounter: Payer: Self-pay | Admitting: Orthopaedic Surgery

## 2020-12-28 ENCOUNTER — Other Ambulatory Visit: Payer: Self-pay

## 2020-12-28 ENCOUNTER — Ambulatory Visit (INDEPENDENT_AMBULATORY_CARE_PROVIDER_SITE_OTHER): Payer: Medicare Other | Admitting: Orthopaedic Surgery

## 2020-12-28 VITALS — BP 118/82 | HR 102 | Ht 63.0 in | Wt 83.0 lb

## 2020-12-28 DIAGNOSIS — M25551 Pain in right hip: Secondary | ICD-10-CM
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
## 2020-12-28 NOTE — Chronic Care Management (AMB) (Signed)
  Care Management   Note  12/28/2020 Name: Adyn Hoes MRN: [id number] DOB: 1966-12-15  Devonna Oboyle is a 55 y.o. year old female who is a primary care patient of Arnette Felts, FNP and is actively engaged with the care management team. I reached out to [name] by phone today to assist with re-scheduling a follow up visit with the Pharmacist  Follow up plan: Telephone appointment with care management team member scheduled for:02/02/2021  [facility] Guide, Embedded Care Coordination [facility] Management

## 2021-01-04 ENCOUNTER — Ambulatory Visit: Payer: Medicare Other | Admitting: Nurse Practitioner

## 2021-01-05 ENCOUNTER — Ambulatory Visit: Payer: Self-pay

## 2021-01-05 ENCOUNTER — Telehealth: Payer: Medicare Other

## 2021-01-05 DIAGNOSIS — J45909 Unspecified asthma, uncomplicated: Secondary | ICD-10-CM

## 2021-01-05 DIAGNOSIS — E119 Type 2 diabetes mellitus without complications: Secondary | ICD-10-CM

## 2021-01-05 NOTE — Patient Instructions (Signed)
   Goals we discussed today:  Goals Addressed            This Visit's Progress   . Work with SW to manage care coordination needs       Timeframe:  Long-Range Goal Priority:  Low Start Date:  12.14.21                           Expected End Date:  4.13.22                     Next expected date of contact: 2.14.22  Patient Goals/Self-Care Activities . Over the next 30 days, patient will:   - Patient will self administer medications as prescribed Patient will attend all scheduled provider appointments Patient will call provider office for new concerns or questions Contact SW as needed prior to next scheduled call Schedule and attend a therapy appointment
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
## 2021-01-22 ENCOUNTER — Other Ambulatory Visit: Payer: Self-pay | Admitting: Nurse Practitioner

## 2021-01-22 DIAGNOSIS — E119 Type 2 diabetes mellitus without complications: Secondary | ICD-10-CM

## 2021-01-25 ENCOUNTER — Other Ambulatory Visit: Payer: Self-pay | Admitting: Nurse Practitioner

## 2021-01-25 ENCOUNTER — Other Ambulatory Visit: Payer: Self-pay

## 2021-01-25 ENCOUNTER — Ambulatory Visit (INDEPENDENT_AMBULATORY_CARE_PROVIDER_SITE_OTHER): Payer: Medicare Other | Admitting: Nurse Practitioner

## 2021-01-25 ENCOUNTER — Encounter: Payer: Self-pay | Admitting: Nurse Practitioner

## 2021-01-25 VITALS — BP 102/60 | HR 104 | Temp 98.4°F | Ht 62.2 in | Wt 86.6 lb

## 2021-01-25 DIAGNOSIS — R29898 Other symptoms and signs involving the musculoskeletal system: Secondary | ICD-10-CM | POA: Diagnosis not present

## 2021-01-25 DIAGNOSIS — E119 Type 2 diabetes mellitus without complications: Secondary | ICD-10-CM | POA: Diagnosis not present

## 2021-01-25 DIAGNOSIS — Z1231 Encounter for screening mammogram for malignant neoplasm of breast: Secondary | ICD-10-CM

## 2021-01-25 DIAGNOSIS — Z2821 Immunization not carried out because of patient refusal: Secondary | ICD-10-CM | POA: Diagnosis not present

## 2021-01-25 DIAGNOSIS — F3289 Other specified depressive episodes: Secondary | ICD-10-CM | POA: Diagnosis not present

## 2021-01-25 DIAGNOSIS — R634 Abnormal weight loss: Secondary | ICD-10-CM | POA: Diagnosis not present

## 2021-01-25 DIAGNOSIS — S0990XD Unspecified injury of head, subsequent encounter: Secondary | ICD-10-CM
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
## 2021-01-25 NOTE — Patient Instructions (Signed)
Diabetes Mellitus Basics  Diabetes mellitus, or diabetes, is a long-term (chronic) disease. It occurs when the body does not properly use sugar (glucose) that is released from food after you eat. Diabetes mellitus may be caused by one or both of these problems:  Your pancreas does not make enough of a hormone called insulin.  Your body does not react in a normal way to the insulin that it makes. Insulin lets glucose enter cells in your body. This gives you energy. If you have diabetes, glucose cannot get into cells. This causes high blood glucose (hyperglycemia). How to treat and manage diabetes You may need to take insulin or other diabetes medicines daily to keep your glucose in balance. If you are prescribed insulin, you will learn how to give yourself insulin by injection. You may need to adjust the amount of insulin you take based on the foods that you eat. You will need to check your blood glucose levels using a glucose monitor as told by your health care provider. The readings can help determine if you have low or high blood glucose. Generally, you should have these blood glucose levels:  Before meals (preprandial): 80-130 mg/dL (4.4-7.2 mmol/L).  After meals (postprandial): below 180 mg/dL (10 mmol/L).  Hemoglobin A1c (HbA1c) level: less than 7%. Your health care provider will set treatment goals for you. Keep all follow-up visits. This is important. Follow these instructions at home: Diabetes medicines Take your diabetes medicines every day as told by your health care provider. List your diabetes medicines here:  Name of medicine: ______________________________ ? Amount (dose): _______________ Time (a.m./p.m.): _______________ Notes: ___________________________________  Name of medicine: ______________________________ ? Amount (dose): _______________ Time (a.m./p.m.): _______________ Notes: ___________________________________  Name of medicine:  ______________________________ ? Amount (dose): _______________ Time (a.m./p.m.): _______________ Notes: ___________________________________ Insulin If you use insulin, list the types of insulin you use here:  Insulin type: ______________________________ ? Amount (dose): _______________ Time (a.m./p.m.): _______________Notes: ___________________________________  Insulin type: ______________________________ ? Amount (dose): _______________ Time (a.m./p.m.): _______________ Notes: ___________________________________  Insulin type: ______________________________ ? Amount (dose): _______________ Time (a.m./p.m.): _______________ Notes: ___________________________________  Insulin type: ______________________________ ? Amount (dose): _______________ Time (a.m./p.m.): _______________ Notes: ___________________________________  Insulin type: ______________________________ ? Amount (dose): _______________ Time (a.m./p.m.): _______________ Notes: ___________________________________ Managing blood glucose Check your blood glucose levels using a glucose monitor as told by your health care provider. Write down the times that you check your glucose levels here:  Time: _______________ Notes: ___________________________________  Time: _______________ Notes: ___________________________________  Time: _______________ Notes: ___________________________________  Time: _______________ Notes: ___________________________________  Time: _______________ Notes: ___________________________________  Time: _______________ Notes: ___________________________________   Low blood glucose Low blood glucose (hypoglycemia) is when glucose is at or below 70 mg/dL (3.9 mmol/L). Symptoms may include:  Feeling: ? Hungry. ? Sweaty and clammy. ? Irritable or easily upset. ? Dizzy. ? Sleepy.  Having: ? A fast heartbeat. ? A headache. ? A change in your vision. ? Numbness around the mouth, lips, or  tongue.  Having trouble with: ? Moving (coordination). ? Sleeping. Treating low blood glucose To treat low blood glucose, eat or drink something containing sugar right away. If you can think clearly and swallow safely, follow the 15:15 rule:  Take 15 grams of a fast-acting carb (carbohydrate), as told by your health care provider.  Some fast-acting carbs are: ? Glucose tablets: take 3-4 tablets. ? Hard candy: eat 3-5 pieces. ? Fruit juice: drink 4 oz (120 mL). ? Regular (not diet) soda: drink 4-6 oz (120-180 mL). ? Honey or sugar:   eat 1 Tbsp (15 mL).  Check your blood glucose levels 15 minutes after you take the carb.  If your glucose is still at or below 70 mg/dL (3.9 mmol/L), take 15 grams of a carb again.  If your glucose does not go above 70 mg/dL (3.9 mmol/L) after 3 tries, get help right away.  After your glucose goes back to normal, eat a meal or a snack within 1 hour. Treating very low blood glucose If your glucose is at or below 54 mg/dL (3 mmol/L), you have very low blood glucose (severe hypoglycemia). This is an emergency. Do not wait to see if the symptoms will go away. Get medical help right away. Call your local emergency services (911 in the [place] Do not drive yourself to the hospital. Questions to ask your health care provider  Should I talk with a diabetes educator?  What equipment will I need to care for myself at home?  What diabetes medicines do I need? When should I take them?  How often do I need to check my blood glucose levels?  What number can I call if I have questions?  When is my follow-up visit?  Where can I find a support group for people with diabetes? Where to find more information  American Diabetes Association: www.diabetes.org  Association of Diabetes Care and Education Specialists: www.diabeteseducator.org Contact a health care provider if:  Your blood glucose is at or above 240 mg/dL (13.3 mmol/L) for 2 days in a row.  You have  been sick or have had a fever for 2 days or more, and you are not getting better.  You have any of these problems for more than 6 hours: ? You cannot eat or drink. ? You feel nauseous. ? You vomit. ? You have diarrhea. Get help right away if:  Your blood glucose is lower than 54 mg/dL (3 mmol/L).  You get confused.  You have trouble thinking clearly.  You have trouble breathing. These symptoms may represent a serious problem that is an emergency. Do not wait to see if the symptoms will go away. Get medical help right away. Call your local emergency services (911 in the [place] Do not drive yourself to the hospital. Summary  Diabetes mellitus is a chronic disease that occurs when the body does not properly use sugar (glucose) that is released from food after you eat.  Take insulin and diabetes medicines as told.  Check your blood glucose every day, as often as told.  Keep all follow-up visits. This is important. This information is not intended to replace advice given to you by your health care provider. Make sure you discuss any questions you have with your health care provider. Document Revised: 04/13/2020 Document Reviewed: 04/13/2020 [employer] Patient Education  2021 [employer]  

## 2021-01-26 LAB — CMP14+EGFR
ALT: 27 IU/L (ref 0–32)
AST: 12 IU/L (ref 0–40)
Albumin/Globulin Ratio: 2.1 (ref 1.2–2.2)
Albumin: 4.6 g/dL (ref 3.8–4.9)
Alkaline Phosphatase: 109 IU/L (ref 44–121)
BUN/Creatinine Ratio: 12 (ref 9–23)
BUN: 14 mg/dL (ref 6–24)
Bilirubin Total: <0.2 mg/dL (ref 0.0–1.2)
CO2: 22 mmol/L (ref 20–29)
Calcium: 10.5 mg/dL — ABNORMAL HIGH (ref 8.7–10.2)
Chloride: 101 mmol/L (ref 96–106)
Creatinine, Ser: 1.19 mg/dL — ABNORMAL HIGH (ref 0.57–1.00)
GFR calc Af Amer: 59 mL/min/{1.73_m2} — ABNORMAL LOW (ref >59)
GFR calc non Af Amer: 51 mL/min/{1.73_m2} — ABNORMAL LOW (ref >59)
Globulin, Total: 2.2 g/dL (ref 1.5–4.5)
Glucose: 257 mg/dL — ABNORMAL HIGH (ref 65–99)
Potassium: 5.2 mmol/L (ref 3.5–5.2)
Sodium: 139 mmol/L (ref 134–144)
Total Protein: 6.8 g/dL (ref 6.0–8.5)

## 2021-01-26 LAB — HEMOGLOBIN A1C
Est. average glucose Bld gHb Est-mCnc: 306 mg/dL
Hgb A1c MFr Bld: 12.3 % — ABNORMAL HIGH (ref 4.8–5.6)

## 2021-01-28 ENCOUNTER — Telehealth: Payer: Medicare Other

## 2021-01-31 ENCOUNTER — Ambulatory Visit (INDEPENDENT_AMBULATORY_CARE_PROVIDER_SITE_OTHER): Payer: Medicare Other

## 2021-01-31 ENCOUNTER — Telehealth: Payer: Medicare Other

## 2021-01-31 DIAGNOSIS — E119 Type 2 diabetes mellitus without complications: Secondary | ICD-10-CM | POA: Diagnosis not present

## 2021-01-31 DIAGNOSIS — J45909 Unspecified asthma, uncomplicated: Secondary | ICD-10-CM

## 2021-01-31 DIAGNOSIS — G2582 Stiff-man syndrome: Secondary | ICD-10-CM

## 2021-01-31 DIAGNOSIS — F32A Depression, unspecified: Secondary | ICD-10-CM | POA: Diagnosis not present

## 2021-02-02 ENCOUNTER — Other Ambulatory Visit: Payer: Self-pay | Admitting: Nurse Practitioner

## 2021-02-02 ENCOUNTER — Ambulatory Visit: Payer: Medicare Other

## 2021-02-02 DIAGNOSIS — J45909 Unspecified asthma, uncomplicated: Secondary | ICD-10-CM

## 2021-02-02 DIAGNOSIS — F32A Depression, unspecified: Secondary | ICD-10-CM

## 2021-02-02 DIAGNOSIS — E119 Type 2 diabetes mellitus without complications: Secondary | ICD-10-CM

## 2021-02-02 DIAGNOSIS — F419 Anxiety disorder, unspecified: Secondary | ICD-10-CM

## 2021-02-02 NOTE — Patient Instructions (Signed)
Goals Addressed      Other   .  Diabetes treatment optimized   On track     Timeframe:  Long-Range Goal Priority:  High Start Date: 01/31/21/                            Expected End Date:  07/31/21  Follow Up Date: 03/08/21   . Self administer medications as prescribed . Attend all scheduled provider appointments . Call pharmacy for medication refills . Call provider office for new concerns or questions . Continue to follow DM dietary and exercise recommendations . Continue to monitor CBG's daily before meals and at bedtime, alerting CCM team and or PCP of abnormal results  . Work with the embedded Pharm D for evaluation of current treatment of DM and consideration for treatment change . Discuss potential referral to Endocrinology if A1c and daily glucose levels remain uncontrolled                          .  Increase My Muscle Strength   On track     Timeframe:  Long-Range Goal Priority:  High Start Date:  01/31/21                           Expected End Date:  07/31/21                     Follow Up Date: 03/08/21  be active in the morning when feeling the strongest do the exercise that the therapist taught me set a daily activity goal start slowly and increase the amount of time every week  Follow up with outpatient PT per recommendations of PCP for strengthening and endurance   Why is this important?    Walking and easy exercises make you stronger. These also give you energy.   Every Sherri [name] bit helps, at any age.    Notes:     .  Physical or Emotional Harm Prevented   On track     Timeframe:  Long-Range Goal Priority:  High Start Date:  01/31/21                           Expected End Date: 07/31/21  Follow up Date: 03/08/21       Over the next 180 days, patient will:  Follow up with Pschyology as recommended by PCP Continue to use family for support Avoid being around anyone who makes her feel unsafe Call 911 to seek emergency assistance if needed                   .  Regain  weight to baseline and minimize risk for malnutrition   On track     Timeframe:  Long-Range Goal Priority:  High Start Date: 01/31/21                            Expected End Date:  07/31/21  Follow Up Date: 03/08/21  Weigh daily first thing in the am after voiding and record weights Try meal planning, meal prepping and making grocery list with all ingredients needed Try making healthy diabetic smoothies at home Keep the CCM team and PCP informed of progress with weight gain and report ongoing weigh loss  Follow up with GI MD per  recommendations of PCP
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
## 2021-02-03 NOTE — Progress Notes (Signed)
She needs to understand typically metformin alone does not improve her diabetes significantly and the risk for heart disease or even heart failure, make sure there is a follow up in 8 weeks.

## 2021-02-06 NOTE — Progress Notes (Signed)
It takes 7-10 business days from the submission of the referral to complete the referral.

## 2021-02-07 ENCOUNTER — Ambulatory Visit: Payer: Medicare Other

## 2021-02-07 DIAGNOSIS — E119 Type 2 diabetes mellitus without complications: Secondary | ICD-10-CM

## 2021-02-07 DIAGNOSIS — J45909 Unspecified asthma, uncomplicated: Secondary | ICD-10-CM

## 2021-02-07 NOTE — Patient Instructions (Signed)
Goals we discussed today:  Goals Addressed            This Visit's Progress   . Healthy Nutrition Achieved       Timeframe:  Short-Term Goal Priority:  High Start Date:    2.14.22                         Expected End Date: 4.15.22                       Next planned outreach: 2.17.22  Patient Goals/Self-Care Activities Over the next 30 days, patient will:   - Patient will self administer medications as prescribed Patient will attend all scheduled provider appointments Patient will call provider office for new concerns or questions Engaged with resources who may contact her to address food insecurity Contact SW as needed prior to next scheduled call

## 2021-02-07 NOTE — Chronic Care Management (AMB) (Signed)
Chronic Care Management    Social Work Note  02/07/2021 Name: Sherri [name] MRN: [id number] DOB: 1966/03/11  Sherri [name] is a 55 y.o. year old female who is a primary care patient of Arnette Felts, FNP. The CCM team was consulted to assist the patient with chronic disease management and/or care coordination needs related to: Food Insecurity.   Engaged with patient by telephone for follow up visit in response to provider referral for social work chronic care management and care coordination services.   Consent to Services:  The patient was given information about Chronic Care Management services, agreed to services, and gave verbal consent prior to initiation of services.  Please see initial visit note for detailed documentation.   Patient agreed to services and consent obtained.   Assessment: Review of patient past medical history, allergies, medications, and health status, including review of relevant consultants reports was performed today as part of a comprehensive evaluation and provision of chronic care management and care coordination services.     SDOH (Social Determinants of Health) assessments and interventions performed:  SDOH Interventions   Flowsheet Row Most Recent Value  SDOH Interventions   Food Insecurity Interventions [id number] Referral  [Referral placed to [employer], [employer], [employer] representative, and [employer]]       Advanced Directives Status: Not addressed in this encounter.  CCM Care Plan  Allergies  Allergen Reactions  . Ibuprofen Other (See Comments)    Does not take due to hx of renal insufficiency "I have kidney disease"   . Lemon Flavor Swelling    Severe Lip Swelling  FRUIT per pt.    . Amoxicillin Diarrhea and Other (See Comments)  . Tylenol [Acetaminophen] Hives    Cannot take large quantities    Outpatient Encounter Medications as of 02/07/2021  Medication Sig Note  . albuterol  (VENTOLIN HFA) 108 (90 Base) MCG/ACT inhaler TAKE 2 PUFFS BY MOUTH EVERY 6 HOURS AS NEEDED FOR WHEEZE OR SHORTNESS OF BREATH   . Ascorbic Acid (VITAMIN C) 1000 MG tablet Take 1,000 mg by mouth daily.   . baclofen (LIORESAL) 20 MG tablet TAKE 1 AND 1/2 TABLETS BY  MOUTH IN THE MORNING AND IN THE EVENING AND 1 TABLET AT MIDDAY 02/02/2021: Taking 1 tablet three times per day.  . cetirizine (ZYRTEC) 10 MG tablet Take 1 tablet (10 mg total) by mouth daily.   . Cholecalciferol (VITAMIN D3) 10 MCG (400 UNIT) tablet Take 1 each by mouth daily.    . Cranberry-Vitamin C-Vitamin E 4200-20-3 MG-MG-UNIT CAPS Take by mouth.    . cyanocobalamin (,VITAMIN B-12,) 1000 MCG/ML injection Inject 1,000 mcg into the muscle every 3 (three) months.   . desloratadine (CLARINEX) 5 MG tablet TAKE 1 TABLET BY MOUTH  DAILY   . diazepam (VALIUM) 10 MG tablet TAKE 2 TABLETS BY MOUTH 3  TIMES DAILY   . diphenhydrAMINE (BENADRYL) 25 MG tablet Take 25 mg by mouth at bedtime as needed for itching or allergies. 01/17/2017: Takes as needed  . EPINEPHRINE 0.3 mg/0.3 mL IJ SOAJ injection INJECT CONTENTS OF 1 PEN AS NEEDED FOR ALLERGIC  RESPONSE AS DIRECTED BY MD. SEEK MEDICAL ATTENTION  AFTER USE.   . famotidine (PEPCID) 20 MG tablet TAKE 1 TABLET BY MOUTH TWICE A DAY   . FLUoxetine (PROZAC) 10 MG capsule TAKE 1 CAPSULE BY MOUTH  DAILY   . gabapentin (NEURONTIN) 300 MG capsule TAKE 1 CAPSULE BY MOUTH TWO TIMES DAILY (Patient taking differently:  Take 300 mg by mouth 2 (two) times daily. TAKE 1 CAPSULE BY MOUTH TWO TIMES DAILY)   . glipiZIDE (GLUCOTROL) 5 MG tablet TAKE 1 TABLET BY MOUTH EVERY DAY BEFORE MEALS   . Glucose Blood (ACCU-CHEK SIMPLICITY TEST STRP VI) by In Vitro route. Insert 1 by subcutaneous route 4 times every day check blood sugar before breakfast, lunch and dinner and bedtime   . hydrOXYzine (VISTARIL) 100 MG capsule TAKE 1 CAPSULE (100 MG TOTAL) BY MOUTH 3 (THREE) TIMES DAILY AS NEEDED FOR ITCHING.   . metFORMIN (GLUCOPHAGE)  500 MG tablet TAKE 2 TABLETS (1,000 MG TOTAL) BY MOUTH 2 (TWO) TIMES DAILY WITH A MEAL.   [name] Olopatadine HCl 0.7 % SOLN Apply 1 drop to eye as needed.    . vitamin E 180 MG (400 UNITS) capsule Take 400 Units by mouth daily.     No facility-administered encounter medications on file as of 02/07/2021.    Patient Active Problem List   Diagnosis Date Noted  . Anxiety   . Chronic rhinitis 04/24/2019  . Keloid 10/10/2018  . Diabetes type 2, controlled ([facility]) 10/10/2018  . Depression 01/02/2017  . History of DVT (deep vein thrombosis) 01/02/2017  . Chronic pain syndrome 01/02/2017  . Cocaine abuse ([facility]) 01/02/2017  . Dysphagia   . Adjustment insomnia   . Distal radius fracture, right 08/16/2016  . Postconcussion syndrome 12/30/2014  . Tremor 03/11/2014  . 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 Hiatal hernia 03/21/2013  . Anemia, iron deficiency 01/29/2013  . Spasm of muscle 09/02/2012  . Anemia 08/28/2012  . Mild intermittent asthma 08/27/2012  . Chronic back pain   . Pernicious anemia 10/30/2011  . DEGENERATIVE DISC DISEASE 08/18/2010  . Asthma with bronchitis 07/28/2010  . Recurrent urticaria 07/28/2010    Conditions to be addressed/monitored: DMII and Asthma; Limited access to food  Care Plan : Social Work Care Plan  Updates made by [name] since 02/07/2021 12:00 AM    Problem: Healthy Nutrition (Wellness)     Goal: Healthy Nutrition Achieved   Start Date: 02/07/2021  Expected End Date: 04/08/2021  This Visit's Progress: On track  Priority: High  Note:   Current Barriers:  . Financial constraints related to cost of living . Limited social support . Limited access to food - unintentional weight loss . Chronic conditions including DM II and Asthma that put patient at increased risk of   Clinical Social Work Clinical Goal(s):  [name] Over the next 60 days, patient will work with SW to address concerns related to food insecurity  Interventions: . 1:1 collaboration with  Arnette Felts, FNP regarding development and update of comprehensive plan of care as evidenced by provider attestation and co-signature . Inter-disciplinary care team collaboration (see longitudinal plan of care) . Successful outbound call placed to the patient to assist with care coordination needs . Determined the patient continues to experience unintentional weight loss- current weight 86 pounds . Discussed the patient has some food in her home but it is not very nutritious . Patient reports she is also experiencing diarrhea and has been referred to a specialist for weight loss and ongoing diarrhea . Obtained verbal permission to place referrals to resources to assist with food insecurity . Referrals placed via [facility] to [employer] food [employer], [employer], [employer], and [employer] . Scheduled follow up call for 2.17.22  Patient Goals/Self-Care Activities Over the next 30 days, patient will:   - Patient will self administer  medications as prescribed Patient will attend all scheduled provider appointments Patient will call provider office for new concerns or questions Engaged with resources who may contact her to address food insecurity Contact SW as needed prior to next scheduled call  Follow up Plan: SW will follow up with patient by phone over the next 5 days       Follow Up Plan: SW will follow up with patient by phone over the next week      [name], BSW, CDP Social Worker, Certified Dementia Practitioner [facility] Care Management [phone]  Total time spent performing care coordination and/or care management activities with the patient by phone or face to face = 21 minutes.

## 2021-02-10 ENCOUNTER — Ambulatory Visit: Payer: Medicare Other

## 2021-02-10 ENCOUNTER — Ambulatory Visit
Admission: RE | Admit: 2021-02-10 | Discharge: 2021-02-10 | Disposition: A | Discharge status: Home / Self Care | Payer: Medicare Other | Source: Ambulatory Visit | Attending: Nurse Practitioner | Admitting: Nurse Practitioner

## 2021-02-10 DIAGNOSIS — J45909 Unspecified asthma, uncomplicated: Secondary | ICD-10-CM | POA: Diagnosis not present

## 2021-02-10 DIAGNOSIS — E119 Type 2 diabetes mellitus without complications: Secondary | ICD-10-CM

## 2021-02-10 DIAGNOSIS — F32A Depression, unspecified: Secondary | ICD-10-CM | POA: Diagnosis not present

## 2021-02-10 DIAGNOSIS — S0990XA Unspecified injury of head, initial encounter: Secondary | ICD-10-CM | POA: Diagnosis not present

## 2021-02-10 DIAGNOSIS — R634 Abnormal weight loss: Secondary | ICD-10-CM

## 2021-02-10 DIAGNOSIS — S0990XD Unspecified injury of head, subsequent encounter: Secondary | ICD-10-CM

## 2021-02-10 NOTE — Chronic Care Management (AMB) (Signed)
Chronic Care Management    Social Work Note  02/10/2021 Name: Sherri [name] MRN: [id number] DOB: [birth year]  Sherri [name] is a 55 y.o. year old female who is a primary care patient of Arnette Felts, FNP. The CCM team was consulted to assist the patient with chronic disease management and/or care coordination needs related to: Food Insecurity.   Engaged with patient by telephone for follow up visit in response to provider referral for social work chronic care management and care coordination services.   Consent to Services:  The patient was given information about Chronic Care Management services, agreed to services, and gave verbal consent prior to initiation of services.  Please see initial visit note for detailed documentation.   Patient agreed to services and consent obtained.   Assessment: Review of patient past medical history, allergies, medications, and health status, including review of relevant consultants reports was performed today as part of a comprehensive evaluation and provision of chronic care management and care coordination services.     SDOH (Social Determinants of Health) assessments and interventions performed:    Advanced Directives Status: Not addressed in this encounter.  CCM Care Plan  Allergies  Allergen Reactions  . Ibuprofen Other (See Comments)    Does not take due to hx of renal insufficiency "I have kidney disease"   . Lemon Flavor Swelling    Severe Lip Swelling  FRUIT per pt.    . Amoxicillin Diarrhea and Other (See Comments)  . Tylenol [Acetaminophen] Hives    Cannot take large quantities    Outpatient Encounter Medications as of 02/10/2021  Medication Sig Note  . albuterol (VENTOLIN HFA) 108 (90 Base) MCG/ACT inhaler TAKE 2 PUFFS BY MOUTH EVERY 6 HOURS AS NEEDED FOR WHEEZE OR SHORTNESS OF BREATH   . Ascorbic Acid (VITAMIN C) 1000 MG tablet Take 1,000 mg by mouth daily.   . baclofen (LIORESAL) 20 MG tablet TAKE 1 AND 1/2 TABLETS BY  MOUTH  IN THE MORNING AND IN THE EVENING AND 1 TABLET AT MIDDAY 02/02/2021: Taking 1 tablet three times per day.  . cetirizine (ZYRTEC) 10 MG tablet Take 1 tablet (10 mg total) by mouth daily.   . Cholecalciferol (VITAMIN D3) 10 MCG (400 UNIT) tablet Take 1 each by mouth daily.    . Cranberry-Vitamin C-Vitamin E 4200-20-3 MG-MG-UNIT CAPS Take by mouth.    . cyanocobalamin (,VITAMIN B-12,) 1000 MCG/ML injection Inject 1,000 mcg into the muscle every 3 (three) months.   . desloratadine (CLARINEX) 5 MG tablet TAKE 1 TABLET BY MOUTH  DAILY   . diazepam (VALIUM) 10 MG tablet TAKE 2 TABLETS BY MOUTH 3  TIMES DAILY   . diphenhydrAMINE (BENADRYL) 25 MG tablet Take 25 mg by mouth at bedtime as needed for itching or allergies. 01/17/2017: Takes as needed  . EPINEPHRINE 0.3 mg/0.3 mL IJ SOAJ injection INJECT CONTENTS OF 1 PEN AS NEEDED FOR ALLERGIC  RESPONSE AS DIRECTED BY MD. SEEK MEDICAL ATTENTION  AFTER USE.   . famotidine (PEPCID) 20 MG tablet TAKE 1 TABLET BY MOUTH TWICE A DAY   . FLUoxetine (PROZAC) 10 MG capsule TAKE 1 CAPSULE BY MOUTH  DAILY   . gabapentin (NEURONTIN) 300 MG capsule TAKE 1 CAPSULE BY MOUTH TWO TIMES DAILY (Patient taking differently: Take 300 mg by mouth 2 (two) times daily. TAKE 1 CAPSULE BY MOUTH TWO TIMES DAILY)   . glipiZIDE (GLUCOTROL) 5 MG tablet TAKE 1 TABLET BY MOUTH EVERY DAY BEFORE MEALS   . Glucose Blood (ACCU-CHEK SIMPLICITY TEST  STRP VI) by In Vitro route. Insert 1 by subcutaneous route 4 times every day check blood sugar before breakfast, lunch and dinner and bedtime   . hydrOXYzine (VISTARIL) 100 MG capsule TAKE 1 CAPSULE (100 MG TOTAL) BY MOUTH 3 (THREE) TIMES DAILY AS NEEDED FOR ITCHING.   . metFORMIN (GLUCOPHAGE) 500 MG tablet TAKE 2 TABLETS (1,000 MG TOTAL) BY MOUTH 2 (TWO) TIMES DAILY WITH A MEAL.   [name] Olopatadine HCl 0.7 % SOLN Apply 1 drop to eye as needed.    . vitamin E 180 MG (400 UNITS) capsule Take 400 Units by mouth daily.     No facility-administered encounter  medications on file as of 02/10/2021.    Patient Active Problem List   Diagnosis Date Noted  . Anxiety   . Chronic rhinitis 04/24/2019  . Keloid 10/10/2018  . Diabetes type 2, controlled (HCC) 10/10/2018  . Depression 01/02/2017  . History of DVT (deep vein thrombosis) 01/02/2017  . Chronic pain syndrome 01/02/2017  . Cocaine abuse (HCC) 01/02/2017  . Dysphagia   . Adjustment insomnia   . Distal radius fracture, right 08/16/2016  . Postconcussion syndrome 12/30/2014  . Tremor 03/11/2014  . 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 Hiatal hernia 03/21/2013  . Anemia, iron deficiency 01/29/2013  . Spasm of muscle 09/02/2012  . Anemia 08/28/2012  . Mild intermittent asthma 08/27/2012  . Chronic back pain   . Pernicious anemia 10/30/2011  . DEGENERATIVE DISC DISEASE 08/18/2010  . Asthma with bronchitis 07/28/2010  . Recurrent urticaria 07/28/2010    Conditions to be addressed/monitored: DMII and Asthma; Limited access to food  Care Plan : Social Work Care Plan  Updates made by [name] since 02/10/2021 12:00 AM    Problem: Healthy Nutrition (Wellness)     Goal: Healthy Nutrition Achieved   Start Date: 02/07/2021  Expected End Date: 04/08/2021  This Visit's Progress: On track  Recent Progress: On track  Priority: High  Note:   Current Barriers:  . Financial constraints related to cost of living . Limited social support . Limited access to food - unintentional weight loss . Chronic conditions including DM II and Asthma that put patient at increased risk of   Clinical Social Work Clinical Goal(s):  [name] Over the next 60 days, patient will work with SW to address concerns related to food insecurity  Interventions: . 1:1 collaboration with Arnette Felts, FNP regarding development and update of comprehensive plan of care as evidenced by provider attestation and co-signature . Inter-disciplinary care team collaboration (see longitudinal plan of care) . Collaboration with Beth  from [employer] [facility] [employer] who indicates patient is eligible to receive assistance from [employer] . Successful outbound call placed to the patient to assess goal progression and determine if referrals placed via [id number] were accepted . Determined the patient has received calls from both [employer] and [employer]  . Discussed the patient did pick up food from [facility] on 2.15.22 - patient reports she was given quite a bit of food and is very thankful  . Patient reports that she was informed she is now in their system and may access food at [facility] when needed . Determined the patient has plans to visit [employer] on Saturday 2.19.22 to pick up non-perishable items . Advised the patient she may also access mobile market from [employer] [facility] [employer] as needed . Provided mobile market schedule to the patient as well as  website for her to access future scheduled . Scheduled follow up call over the next month  Patient Goals/Self-Care Activities Over the next 30 days, patient will:   - Patient will self administer medications as prescribed Patient will attend all scheduled provider appointments Patient will call provider office for new concerns or questions Engage with resources to address food insecurity Contact SW as needed prior to next scheduled call  Follow up Plan: SW will follow up with patient by phone over the next month       Follow Up Plan: SW will follow up with patient by phone over the next month      [name], BSW, CDP Social Worker, Certified Dementia Practitioner [facility] Care Management [phone]  Total time spent performing care coordination and/or care management activities with the patient by phone or face to face = 18 minutes.

## 2021-02-10 NOTE — Patient Instructions (Signed)
Goals we discussed today:  Goals Addressed            This Visit's Progress   . Healthy Nutrition Achieved   On track    Timeframe:  Short-Term Goal Priority:  High Start Date:    2.14.22                         Expected End Date: 4.15.22                       Next planned outreach: 3.9.22  Patient Goals/Self-Care Activities Over the next 30 days, patient will:   - Patient will self administer medications as prescribed Patient will attend all scheduled provider appointments Patient will call provider office for new concerns or questions Engage with resources to address food insecurity Contact SW as needed prior to next scheduled call

## 2021-02-14 DIAGNOSIS — R262 Difficulty in walking, not elsewhere classified: Secondary | ICD-10-CM | POA: Diagnosis not present

## 2021-02-14 DIAGNOSIS — M6281 Muscle weakness (generalized): Secondary | ICD-10-CM | POA: Diagnosis not present

## 2021-02-16 ENCOUNTER — Inpatient Hospital Stay: Payer: Medicare Other

## 2021-02-16 ENCOUNTER — Inpatient Hospital Stay: Payer: Medicare Other | Admitting: Family
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
## 2021-02-25 ENCOUNTER — Ambulatory Visit: Payer: Medicare Other | Admitting: Family

## 2021-02-25 ENCOUNTER — Ambulatory Visit: Payer: Medicare Other

## 2021-02-25 ENCOUNTER — Other Ambulatory Visit: Payer: Medicare Other

## 2021-02-28 ENCOUNTER — Ambulatory Visit: Payer: Self-pay

## 2021-02-28 ENCOUNTER — Ambulatory Visit (INDEPENDENT_AMBULATORY_CARE_PROVIDER_SITE_OTHER): Payer: Medicare Other

## 2021-02-28 ENCOUNTER — Telehealth: Payer: Medicare Other

## 2021-02-28 DIAGNOSIS — J45909 Unspecified asthma, uncomplicated: Secondary | ICD-10-CM

## 2021-02-28 DIAGNOSIS — E119 Type 2 diabetes mellitus without complications: Secondary | ICD-10-CM | POA: Diagnosis not present

## 2021-02-28 DIAGNOSIS — G2582 Stiff-man syndrome: Secondary | ICD-10-CM

## 2021-02-28 DIAGNOSIS — F32A Depression, unspecified: Secondary | ICD-10-CM

## 2021-02-28 NOTE — Patient Instructions (Signed)
   Goals we discussed today:  Goals Addressed            This Visit's Progress   . Work with SW to manage care coordination needs       Timeframe:  Long-Range Goal Priority:  Low Start Date:  12.14.21                           Expected End Date:  4.13.22                     Next expected date of contact: 3.9.22  Patient Goals/Self-Care Activities . Over the next 30 days, patient will:   - Patient will self administer medications as prescribed Patient will attend all scheduled provider appointments Patient will call provider office for new concerns or questions Contact SW as needed prior to next scheduled call

## 2021-02-28 NOTE — Chronic Care Management (AMB) (Signed)
Chronic Care Management    Social Work Note  02/28/2021 Name: Sherri [name] MRN: [id number] DOB: 25-Jul-1966  Sherri [name] is a 55 y.o. year old female who is a primary care patient of Arnette Felts, FNP. The CCM team was consulted to assist the patient with chronic disease management and/or care coordination needs related to: [employer] .   Engaged with patient by telephone for follow up visit in response to provider referral for social work chronic care management and care coordination services.   Consent to Services:  The patient was given information about Chronic Care Management services, agreed to services, and gave verbal consent prior to initiation of services.  Please see initial visit note for detailed documentation.   Patient agreed to services and consent obtained.   Assessment: Review of patient past medical history, allergies, medications, and health status, including review of relevant consultants reports was performed today as part of a comprehensive evaluation and provision of chronic care management and care coordination services.     SDOH (Social Determinants of Health) assessments and interventions performed:    Advanced Directives Status: Not addressed in this encounter.  CCM Care Plan  Allergies  Allergen Reactions   Ibuprofen Other (See Comments)    Does not take due to hx of renal insufficiency "I have kidney disease"    Lemon Flavor Swelling    Severe Lip Swelling  FRUIT per pt.     Amoxicillin Diarrhea and Other (See Comments)   Tylenol [Acetaminophen] Hives    Cannot take large quantities    Outpatient Encounter Medications as of 02/28/2021  Medication Sig Note   albuterol (VENTOLIN HFA) 108 (90 Base) MCG/ACT inhaler TAKE 2 PUFFS BY MOUTH EVERY 6 HOURS AS NEEDED FOR WHEEZE OR SHORTNESS OF BREATH    Ascorbic Acid (VITAMIN C) 1000 MG tablet Take 1,000 mg by mouth daily.    baclofen (LIORESAL) 20 MG tablet TAKE 1 AND 1/2 TABLETS BY   MOUTH IN THE MORNING AND IN THE EVENING AND 1 TABLET AT MIDDAY 02/02/2021: Taking 1 tablet three times per day.   cetirizine (ZYRTEC) 10 MG tablet Take 1 tablet (10 mg total) by mouth daily.    Cholecalciferol (VITAMIN D3) 10 MCG (400 UNIT) tablet Take 1 each by mouth daily.     Cranberry-Vitamin C-Vitamin E 4200-20-3 MG-MG-UNIT CAPS Take by mouth.     cyanocobalamin (,VITAMIN B-12,) 1000 MCG/ML injection Inject 1,000 mcg into the muscle every 3 (three) months.    desloratadine (CLARINEX) 5 MG tablet TAKE 1 TABLET BY MOUTH  DAILY    diazepam (VALIUM) 10 MG tablet TAKE 2 TABLETS BY MOUTH 3  TIMES DAILY    diphenhydrAMINE (BENADRYL) 25 MG tablet Take 25 mg by mouth at bedtime as needed for itching or allergies. 01/17/2017: Takes as needed   EPINEPHRINE 0.3 mg/0.3 mL IJ SOAJ injection INJECT CONTENTS OF 1 PEN AS NEEDED FOR ALLERGIC  RESPONSE AS DIRECTED BY MD. SEEK MEDICAL ATTENTION  AFTER USE.    famotidine (PEPCID) 20 MG tablet TAKE 1 TABLET BY MOUTH TWICE A DAY    FLUoxetine (PROZAC) 10 MG capsule TAKE 1 CAPSULE BY MOUTH  DAILY    gabapentin (NEURONTIN) 300 MG capsule TAKE 1 CAPSULE BY MOUTH TWO TIMES DAILY (Patient taking differently: Take 300 mg by mouth 2 (two) times daily. TAKE 1 CAPSULE BY MOUTH TWO TIMES DAILY)    glipiZIDE (GLUCOTROL) 5 MG tablet TAKE 1 TABLET BY MOUTH EVERY DAY BEFORE MEALS    Glucose Blood (ACCU-CHEK SIMPLICITY  TEST STRP VI) by In Vitro route. Insert 1 by subcutaneous route 4 times every day check blood sugar before breakfast, lunch and dinner and bedtime    hydrOXYzine (VISTARIL) 100 MG capsule TAKE 1 CAPSULE (100 MG TOTAL) BY MOUTH 3 (THREE) TIMES DAILY AS NEEDED FOR ITCHING.    metFORMIN (GLUCOPHAGE) 500 MG tablet TAKE 2 TABLETS (1,000 MG TOTAL) BY MOUTH 2 (TWO) TIMES DAILY WITH A MEAL.    Olopatadine HCl 0.7 % SOLN Apply 1 drop to eye as needed.     vitamin E 180 MG (400 UNITS) capsule Take 400 Units by mouth daily.     No facility-administered encounter  medications on file as of 02/28/2021.    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   Hiatal hernia 03/21/2013   Anemia, iron deficiency 01/29/2013   Spasm of muscle 09/02/2012   Anemia 08/28/2012   Mild intermittent asthma 08/27/2012   Chronic back pain    Pernicious anemia 10/30/2011   DEGENERATIVE DISC DISEASE 08/18/2010   Asthma with bronchitis 07/28/2010   Recurrent urticaria 07/28/2010    Conditions to be addressed/monitored: DMII and Asthma; care coordination  Care Plan : Social Work Care Plan  Updates made by [name] since 02/28/2021 12:00 AM    Problem: Care Coordination   Priority: Low  Onset Date: 12/07/2020    Long-Range Goal: Collaborate with RN Care Manager to perform appropriate assessments to assist with care coordination needs   Start Date: 12/07/2020  Expected End Date: 04/06/2021  Recent Progress: On track  Priority: Low  Note:   Current Barriers:   Chronic disease management support and education needs related to DMII and Asthma    Financial constraints related to costs of daily living  Limited social support  Limited access to food  Social Worker Clinical Goal(s):   Over the next 120 days, patient will work with SW to identify and address any acute and/or chronic care coordination needs related to the self health management of DMII and Asthma    CCM SW Interventions:   1:1 collaboration with Arnette Felts, FNP regarding development and update of comprehensive plan of care as evidenced by  provider attestation and co-signature  Inter-disciplinary care team collaboration (see longitudinal plan of care)  Voice message received from the patient who indicates a blood sugar reading of 373 and feeling dizzy  Successful outbound call placed to the patient   Determined the patient is now laying down trying to feel better. Patient reports feeling dizzy with an upset stomach and upper lip is numb. She indicates she is waiting on her providers office to open up after lunch to contact them  Discussed the provider office will be closed until 2:00 pm and it is now 1:20 pm  Assessed for patient interested in going to the ED due to high blood sugar and experiencing above symptoms - patient indicated she would like to go  Attempted to dial 911 on behalf of the patient - patient declined stating she would call her sister first  Attempted to  assist the patient in contacting her sister- patient declined stating she would call herself  Requested patient to contact SW to inform of transportation plans to get to the ED  Collaboration with patients primary provider Arnette Felts FNP and embedded care manager Delsa Sale, RN to inform of interventions and plan  Patient Goals/Self-Care Activities  Over the next 30 days, patient will:   - Patient will self administer medications as prescribed Patient will attend all scheduled provider appointments Patient will call provider office for new concerns or questions Contact SW as needed prior to next scheduled call  Follow Up Plan:  SW will follow up with the patient over the next week       Follow Up Plan: SW will follow up with patient by phone over the next week      [name], BSW, CDP Social Worker, Certified Dementia Practitioner [facility] Care Management [phone]  Total time spent performing care coordination and/or care management activities with the patient by phone or face to face = 20 minutes.

## 2021-03-01 ENCOUNTER — Other Ambulatory Visit: Payer: Self-pay | Admitting: Nurse Practitioner

## 2021-03-01 ENCOUNTER — Other Ambulatory Visit: Payer: Self-pay | Admitting: Neurology

## 2021-03-01 ENCOUNTER — Telehealth: Payer: Medicare Other

## 2021-03-01 DIAGNOSIS — E119 Type 2 diabetes mellitus without complications: Secondary | ICD-10-CM

## 2021-03-01 DIAGNOSIS — R21 Rash and other nonspecific skin eruption: Secondary | ICD-10-CM

## 2021-03-01 MED ORDER — DAPAGLIFLOZIN PROPANEDIOL 5 MG PO TABS: 5.0000 mg | ORAL_TABLET | Freq: Every day | ORAL | 1 refills | Status: DC

## 2021-03-01 MED ORDER — DAPAGLIFLOZIN PROPANEDIOL 5 MG PO TABS: 5.0000 mg | ORAL_TABLET | Freq: Every day | ORAL | 2 refills | Status: DC

## 2021-03-01 MED ORDER — TRESIBA FLEXTOUCH 100 UNIT/ML ~~LOC~~ SOPN: 10.0000 [IU] | PEN_INJECTOR | Freq: Every day | SUBCUTANEOUS | 0 refills | Status: DC

## 2021-03-02 ENCOUNTER — Telehealth: Payer: Self-pay

## 2021-03-02 ENCOUNTER — Telehealth: Payer: Medicare Other

## 2021-03-02 NOTE — Progress Notes (Signed)
03/02/21-Attempted to outreach the patient to remind her of CCM Call visit tomorrow 03/03/21 at 1:00 PM with Cherylin Mylar, CPP. No answer, left a voicemail of appointment time and date. Patient made aware in voicemail to have medications and supplements during call.   Cherylin Mylar, CPP Notified.   Suezanne Cheshire, [facility] Clinical Pharmacist Assistant [phone]

## 2021-03-02 NOTE — Patient Instructions (Signed)
Goals Addressed    . Diabetes treatment optimized   On track    Timeframe:  Long-Range Goal Priority:  High Start Date: 01/31/21/                            Expected End Date:  07/31/21  Follow Up Date: 03/08/21   . Continue to follow DM dietary and exercise recommendations . Continue to monitor CBG's daily before meals and at bedtime, alerting CCM team and or PCP of abnormal results  . Work with the embedded Pharm D for evaluation of current treatment of DM and consideration for treatment change . Discuss potential referral to Endocrinology if A1c and daily glucose levels remain uncontrolled  . Pick up Farxiga samples from PCP office and start this medication as directed . Expect a call from PCP office to schedule 1:1 visit for instruction on use of Tresiba . Increase your water intake and strictly adhere to low carb diet, especially while BS are elevated . Take all of your DM medications exactly as prescribed

## 2021-03-02 NOTE — Telephone Encounter (Signed)
  Chronic Care Management   Outreach Note  03/02/2021 Name: Sherri [name] MRN: [id number] DOB: 1966-07-31  Referred by: Arnette Felts, FNP Reason for referral : Chronic Care Management   An unsuccessful telephone outreach was attempted today. The patient was referred to the case management team for assistance with care management and care coordination.   Follow Up Plan: A HIPAA compliant phone message was left for the patient providing contact information and requesting a return call.  The care management team will reach out to the patient again over the next 21 days.   [name], BSW, CDP Social Worker, Certified Dementia Practitioner [facility] Care Management [phone]

## 2021-03-02 NOTE — Chronic Care Management (AMB) (Addendum)
Chronic Care Management   CCM RN Visit Note  03/02/2021 Name: Sherri [name] MRN: [id number] DOB: [birth year]  Subjective: Sherri [name] is a 55 y.o. year old female who is a primary care patient of Arnette Felts, FNP. The care management team was consulted for assistance with disease management and care coordination needs.    Engaged with patient by telephone for follow up visit in response to provider referral for case management and/or care coordination services.   Consent to Services:  The patient was given information about Chronic Care Management services, agreed to services, and gave verbal consent prior to initiation of services.  Please see initial visit note for detailed documentation.   Patient agreed to services and verbal consent obtained.   Assessment: Review of patient past medical history, allergies, medications, health status, including review of consultants reports, laboratory and other test data, was performed as part of comprehensive evaluation and provision of chronic care management services.   SDOH (Social Determinants of Health) assessments and interventions performed:  Yes  CCM Care Plan  Allergies  Allergen Reactions  . Ibuprofen Other (See Comments)    Does not take due to hx of renal insufficiency "I have kidney disease"   . Lemon Flavor Swelling    Severe Lip Swelling  FRUIT per pt.    . Amoxicillin Diarrhea and Other (See Comments)  . Tylenol [Acetaminophen] Hives    Cannot take large quantities    Outpatient Encounter Medications as of 02/28/2021  Medication Sig Note  . albuterol (VENTOLIN HFA) 108 (90 Base) MCG/ACT inhaler TAKE 2 PUFFS BY MOUTH EVERY 6 HOURS AS NEEDED FOR WHEEZE OR SHORTNESS OF BREATH   . Ascorbic Acid (VITAMIN C) 1000 MG tablet Take 1,000 mg by mouth daily.   . baclofen (LIORESAL) 20 MG tablet TAKE 1 AND 1/2 TABLETS BY  MOUTH IN THE MORNING AND IN THE EVENING AND 1 TABLET AT MIDDAY 02/02/2021: Taking 1 tablet three times per day.   . cetirizine (ZYRTEC) 10 MG tablet Take 1 tablet (10 mg total) by mouth daily.   . Cholecalciferol (VITAMIN D3) 10 MCG (400 UNIT) tablet Take 1 each by mouth daily.    . Cranberry-Vitamin C-Vitamin E 4200-20-3 MG-MG-UNIT CAPS Take by mouth.    . cyanocobalamin (,VITAMIN B-12,) 1000 MCG/ML injection Inject 1,000 mcg into the muscle every 3 (three) months.   . desloratadine (CLARINEX) 5 MG tablet TAKE 1 TABLET BY MOUTH  DAILY   . diazepam (VALIUM) 10 MG tablet TAKE 2 TABLETS BY MOUTH 3  TIMES DAILY   . diphenhydrAMINE (BENADRYL) 25 MG tablet Take 25 mg by mouth at bedtime as needed for itching or allergies. 01/17/2017: Takes as needed  . famotidine (PEPCID) 20 MG tablet TAKE 1 TABLET BY MOUTH TWICE A DAY   . gabapentin (NEURONTIN) 300 MG capsule TAKE 1 CAPSULE BY MOUTH TWO TIMES DAILY (Patient taking differently: Take 300 mg by mouth 2 (two) times daily. TAKE 1 CAPSULE BY MOUTH TWO TIMES DAILY)   . glipiZIDE (GLUCOTROL) 5 MG tablet TAKE 1 TABLET BY MOUTH EVERY DAY BEFORE MEALS   . Glucose Blood (ACCU-CHEK SIMPLICITY TEST STRP VI) by In Vitro route. Insert 1 by subcutaneous route 4 times every day check blood sugar before breakfast, lunch and dinner and bedtime   . metFORMIN (GLUCOPHAGE) 500 MG tablet TAKE 2 TABLETS (1,000 MG TOTAL) BY MOUTH 2 (TWO) TIMES DAILY WITH A MEAL.   [name] Olopatadine HCl 0.7 % SOLN Apply 1 drop to eye as needed.    [name]  vitamin E 180 MG (400 UNITS) capsule Take 400 Units by mouth daily.    . [DISCONTINUED] EPINEPHRINE 0.3 mg/0.3 mL IJ SOAJ injection INJECT CONTENTS OF 1 PEN AS NEEDED FOR ALLERGIC  RESPONSE AS DIRECTED BY MD. SEEK MEDICAL ATTENTION  AFTER USE.   . [DISCONTINUED] FLUoxetine (PROZAC) 10 MG capsule TAKE 1 CAPSULE BY MOUTH  DAILY   . [DISCONTINUED] hydrOXYzine (VISTARIL) 100 MG capsule TAKE 1 CAPSULE (100 MG TOTAL) BY MOUTH 3 (THREE) TIMES DAILY AS NEEDED FOR ITCHING.    No facility-administered encounter medications on file as of 02/28/2021.    Patient Active Problem  List   Diagnosis Date Noted  . Anxiety   . Chronic rhinitis 04/24/2019  . Keloid 10/10/2018  . Diabetes type 2, controlled (HCC) 10/10/2018  . Depression 01/02/2017  . History of DVT (deep vein thrombosis) 01/02/2017  . Chronic pain syndrome 01/02/2017  . Cocaine abuse (HCC) 01/02/2017  . Dysphagia   . Adjustment insomnia   . Distal radius fracture, right 08/16/2016  . Postconcussion syndrome 12/30/2014  . Tremor 03/11/2014  . 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 Hiatal hernia 03/21/2013  . Anemia, iron deficiency 01/29/2013  . Spasm of muscle 09/02/2012  . Anemia 08/28/2012  . Mild intermittent asthma 08/27/2012  . Chronic back pain   . Pernicious anemia 10/30/2011  . DEGENERATIVE DISC DISEASE 08/18/2010  . Asthma with bronchitis 07/28/2010  . Recurrent urticaria 07/28/2010    Conditions to be addressed/monitored:DMII, Stiff-Person Syndrome, Asthma with bronchitis, GERD  Care Plan : Diabetes Type 2 (Adult)  Updates made by [name], RN since 03/02/2021 12:00 AM    Problem: Glycemic Management (Diabetes, Type 2)   Priority: High    Long-Range Goal: Glycemic Management Optimized   Start Date: 01/31/2021  Expected End Date: 08/02/2021  This Visit's Progress: On track  Recent Progress: Not on track  Priority: High  Note:   Current Barriers:  [name] Knowledge Deficits related to disease process and Self Health management of Asthma . 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 Financial difficulty buying food  . Relationship strain with boyfriend  Nurse Case Manager Clinical Goal(s):  [name] Over the next 180 days, patient will work with the CCM team and PCP to address needs related to disease education and support for improved Self Health management of Diabetes as evidence by patient will lower her A1c to 7.5 or below  CCM RN CM Interventions:  . Received update from embedded BSW advising patient's FBS are elevated  in the 300's, PCP also notified . Successful call completed with patient  . Assessed for medication adherence, patient admits she has missed several doses of her Metformin and Glipizide or have taken them late; Reviewed current treatment: Metformin 500 mg 2 tablets twice daily (1000 mg total) with a meal, Glipizide 5 mg daily with a meal  . Determined PCP previously prescribed Farxiga 5 mg qd and Tresiba 10 units q hs, samples to be provided to patient . Determined patient did not follow through with this recommendation, she is reluctant to start insulin . Educated patient about [employer] and provided her with the website in order to further review the manufacturer recommendations . Educated on basic disease process of DM; Educated on potential complications that may occur for uncontrolled DM . Advised patient, providing education and rationale, to check cbg daily before meals and at bedtime and to record, calling the CCM team and or PCP for findings outside established parameters  .  Collaborated with PCP regarding medication samples and patient's FBS are running in 200's-300's due to missing DM doses/taking DM doses late . Received instruction per PCP for samples and instructed patient accordingly, pick up Farxiga samples in the next 7 days, expect a call from PCP MA for 1:1 for instruction on Tresiba administration, patient verbalizes understanding  . Discussed plans with patient for ongoing care management follow up and provided patient with direct contact information for care management team Patient Self Care Activities:  . Continue to follow DM dietary and exercise recommendations . Continue to monitor CBG's daily before meals and at bedtime, alerting CCM team and or PCP of abnormal results  . Work with the embedded Pharm D for evaluation of current treatment of DM and consideration for treatment change . Discuss potential referral to Endocrinology if A1c and daily glucose levels remain  uncontrolled  . Pick up Farxiga samples from PCP office and start this medication as directed . Expect a call from PCP office to schedule 1:1 visit for instruction on use of Tresiba . Increase your water intake and strictly adhere to low carb diet, especially while BS are elevated . Take all of your DM medications exactly as prescribed  Next Follow up Call: 03/08/21    Plan:Telephone follow up appointment with care management team member scheduled for:  03/08/21  Delsa Sale, RN, BSN, CCM Care Management Coordinator [facility] Care Management/[facility]  Direct Phone: [phone]

## 2021-03-03 ENCOUNTER — Ambulatory Visit: Payer: Medicare Other

## 2021-03-03 DIAGNOSIS — F32A Depression, unspecified: Secondary | ICD-10-CM

## 2021-03-03 DIAGNOSIS — M6281 Muscle weakness (generalized): Secondary | ICD-10-CM | POA: Diagnosis not present

## 2021-03-03 DIAGNOSIS — E119 Type 2 diabetes mellitus without complications: Secondary | ICD-10-CM

## 2021-03-03 DIAGNOSIS — R262 Difficulty in walking, not elsewhere classified: Secondary | ICD-10-CM | POA: Diagnosis not present

## 2021-03-03 NOTE — Progress Notes (Addendum)
Chronic Care Management Pharmacy Note  03/23/2021 Name:  Sherri [name] MRN:  [id number] DOB:  08/21/1966  Subjective: Sherri [name] is an 55 y.o. year old female who is a primary patient of Minette Brine, [place].  The CCM team was consulted for assistance with disease management and care coordination needs.    Engaged with patient by telephone for follow up visit in response to provider referral for pharmacy case management and/or care coordination services.   Consent to Services:  The patient was given information about Chronic Care Management services, agreed to services, and gave verbal consent prior to initiation of services.  Please see initial visit note for detailed documentation.   Patient Care Team: Minette Brine, FNP as PCP - General (General Practice) Rex Kras, Claudette Stapler, RN as [place] [facility] [place], [place] as Social Worker [place], Kennieth Francois, [place] (Inactive) (Pharmacist)  Recent office visits: 01/25/2021 PCP OV  Recent consult visits: 12/28/2020 Orthotics for right hip pain   Hospital visits: None in previous 6 months  Objective:  Lab Results  Component Value Date   CREATININE 1.19 (H) 01/25/2021   BUN 14 01/25/2021   GFRNONAA 51 (L) 01/25/2021   GFRAA 59 (L) 01/25/2021   NA 139 01/25/2021   K 5.2 01/25/2021   CALCIUM 10.5 (H) 01/25/2021   CO2 22 01/25/2021    Lab Results  Component Value Date/Time   HGBA1C 12.3 (H) 01/25/2021 05:23 PM   HGBA1C 6.4 (H) 06/03/2020 05:00 PM   MICROALBUR 36m 03/22/2021 11:39 AM   MICROALBUR 30 06/04/2020 12:29 PM    Last diabetic Eye exam: No results found for: HMDIABEYEEXA  Last diabetic Foot exam: No results found for: HMDIABFOOTEX   Lab Results  Component Value Date   CHOL 172 06/03/2020   HDL 75 06/03/2020   LDLCALC 83 06/03/2020   TRIG 76 06/03/2020   CHOLHDL 2.3 06/03/2020    Hepatic Function Latest Ref Rng & Units 01/25/2021 11/16/2020 07/27/2020  Total Protein 6.0 - 8.5 g/dL 6.8 7.0 7.1   Albumin 3.8 - 4.9 g/dL 4.6 4.0 4.5  AST 0 - 40 IU/L 12 10(L) 15  ALT 0 - 32 IU/L _0 Alk Phosphatase 44 - 121 IU/L 109 70 72  Total Bilirubin 0.0 - 1.2 mg/dL <0.2 0.2(L) 0.2(L)    Lab Results  Component Value Date/Time   TSH 1.150 06/03/2020 05:00 PM   TSH 1.176 12/14/2015 10:23 AM   TSH 1.200 12/18/2014 05:30 AM    CBC Latest Ref Rng & Units 11/16/2020 11/12/2020 07/27/2020  WBC 4.0 - 10.5 K/uL 7.6 9.2 5.3  Hemoglobin 12.0 - 15.0 g/dL 12.4 13.2 11.7(L)  Hematocrit 36.0 - 46.0 % 37.1 40.2 35.5(L)  Platelets 150 - 400 K/uL 263 258 255    Lab Results  Component Value Date/Time   VD25OH 74.0 12/26/2016 10:02 AM   VD25OH 58.8 08/24/2016 10:11 AM    Clinical ASCVD: No  The 10-year ASCVD risk score (Mikey BussingDC Jr., et al., 2013) is: 4%   Values used to calculate the score:     Age: [age]years     Sex: Female     Is Non-Hispanic African American: Yes     Diabetic: Yes     Tobacco smoker: No     Systolic Blood Pressure: 1798mmHg     Is BP treated: No     HDL Cholesterol: 75 mg/dL     Total Cholesterol: 172 mg/dL    [facility] 06/03/2020 02/16/2020  01/15/2020  Decreased Interest 0 0 0  Down, Depressed, Hopeless 0 0 0  PHQ - 2 Score 0 0 0  Altered sleeping 0 - 0  Tired, decreased energy 0 - 0  Change in appetite 0 - 0  Feeling bad or failure about yourself  0 - 0  Trouble concentrating 0 - 0  Moving slowly or fidgety/restless 0 - 0  Suicidal thoughts 0 - 0  PHQ-9 Score 0 - 0  Difficult doing work/chores Not difficult at all - Not difficult at all  Some recent data might be hidden     Social History   Tobacco Use  Smoking Status Never Smoker  Smokeless Tobacco Never Used  Tobacco Comment   Never Used Tobacco   BP Readings from Last 3 Encounters:  03/22/21 116/74  01/25/21 102/60  12/28/20 118/82   Pulse Readings from Last 3 Encounters:  03/22/21 (!) 102  01/25/21 (!) 104  12/28/20 (!) 102   Wt Readings from Last 3 Encounters:  03/22/21 87  lb 9.6 oz (39.7 kg)  01/25/21 86 lb 9.6 oz (39.3 kg)  12/28/20 83 lb (37.6 kg)    Assessment/Interventions: Review of patient past medical history, allergies, medications, health status, including review of consultants reports, laboratory and other test data, was performed as part of comprehensive evaluation and provision of chronic care management services.   SDOH:  (Social Determinants of Health) assessments and interventions performed: No   CCM Care Plan  Allergies  Allergen Reactions  . Ibuprofen Other (See Comments)    Does not take due to hx of renal insufficiency "I have kidney disease"   . Lemon Flavor Swelling    Severe Lip Swelling  FRUIT per pt.    . Amoxicillin Diarrhea and Other (See Comments)  . Tylenol [Acetaminophen] Hives    Cannot take large quantities    Medications Reviewed Today    Reviewed by Minette Brine, FNP (Family Nurse Practitioner) on 03/10/21 at 1718  Med List Status: <None>  Medication Order Taking? Sig Documenting Provider Last Dose Status Informant  albuterol (VENTOLIN HFA) 108 (90 Base) MCG/ACT inhaler [id number] Yes TAKE 2 PUFFS BY MOUTH EVERY 6 HOURS AS NEEDED FOR WHEEZE OR SHORTNESS OF Placido Sou, FNP Taking Active   Ascorbic Acid (VITAMIN C) 1000 MG tablet [id number] Yes Take 1,000 mg by mouth daily. [provider] Taking Active   baclofen (LIORESAL) 20 MG tablet [id number] Yes TAKE 1 AND 1/2 TABLETS BY  MOUTH IN THE MORNING AND IN THE EVENING AND 1 TABLET AT MIDDAY Suzzanne Cloud, NP Taking Active            Med Note Jeoffrey Massed Feb 02, 2021  2:31 PM) Taking 1 tablet three times per day.  benzonatate (TESSALON PERLES) 100 MG capsule [id number] Yes Take 1 capsule (100 mg total) by mouth every 6 (six) hours as needed. Minette Brine, FNP  Active   cetirizine (ZYRTEC) 10 MG tablet [id number] Yes Take 1 tablet (10 mg total) by mouth daily. Minette Brine, FNP Taking Active   Cholecalciferol (VITAMIN D3) 10 MCG (400 UNIT)  tablet [id number] Yes Take 1 each by mouth daily.  [provider] Taking Active Self  Cranberry-Vitamin C-Vitamin E 4200-20-3 MG-MG-UNIT CAPS [id number] Yes Take by mouth.  [provider] Taking Active   cyanocobalamin (,VITAMIN B-12,) 1000 MCG/ML injection [id number] Yes Inject 1,000 mcg into the muscle every 3 (three) months. [provider] Taking Active Self  Med Note Mel Almond, MEGAN T   Mon Apr 14, 2019  2:24 PM)    dapagliflozin propanediol (FARXIGA) 5 MG TABS tablet [id number] Yes Take 1 tablet (5 mg total) by mouth daily before breakfast. Minette Brine, FNP Taking Active   desloratadine (CLARINEX) 5 MG tablet [id number] Yes TAKE 1 TABLET BY MOUTH  DAILY Minette Brine, FNP Taking Active   diazepam (VALIUM) 10 MG tablet [id number] Yes TAKE 2 TABLETS BY MOUTH 3  TIMES DAILY Kathrynn Ducking, MD Taking Active   diphenhydrAMINE (BENADRYL) 25 MG tablet [id number] Yes Take 25 mg by mouth at bedtime as needed for itching or allergies. [provider] Taking Active Self           Med Note Marzetta Merino Jan 17, 2017 12:55 PM) Dewaine Conger as needed  EPINEPHRINE 0.3 mg/0.3 mL IJ SOAJ injection [id number] Yes INJECT CONTENTS OF 1 PEN AS NEEDED FOR ALLERGIC  RESPONSE AS DIRECTED BY MD. [place]  AFTER USE. Minette Brine, FNP Taking Active   famotidine (PEPCID) 20 MG tablet [id number] Yes TAKE 1 TABLET BY MOUTH TWICE A Nolene Ebbs, FNP Taking Active   FLUoxetine (PROZAC) 10 MG capsule [id number] Yes TAKE 1 CAPSULE BY MOUTH  DAILY Suzzanne Cloud, NP Taking Active   gabapentin (NEURONTIN) 300 MG capsule [id number] Yes TAKE 1 CAPSULE BY MOUTH TWO TIMES DAILY  Patient taking differently: Take 300 mg by mouth 2 (two) times daily. TAKE 1 CAPSULE BY MOUTH TWO TIMES DAILY   Suzzanne Cloud, NP Taking Active Self  glipiZIDE (GLUCOTROL) 5 MG tablet [id number] Yes TAKE 1 TABLET BY MOUTH EVERY DAY BEFORE MEALS Minette Brine, FNP Taking Active   Glucose Blood  (ACCU-CHEK SIMPLICITY TEST STRP VI) [id number] Yes by In Vitro route. Insert 1 by subcutaneous route 4 times every day check blood sugar before breakfast, lunch and dinner and bedtime [provider] Taking Active   hydrOXYzine (VISTARIL) 100 MG capsule [id number] Yes TAKE 1 CAPSULE BY MOUTH 3  TIMES DAILY AS NEEDED FOR  Gerrit Friends, FNP Taking Active   insulin degludec (TRESIBA FLEXTOUCH) 100 UNIT/ML FlexTouch Pen [id number] Yes Inject 10 Units into the skin daily. Minette Brine, FNP Taking Active   metFORMIN (GLUCOPHAGE) 500 MG tablet [id number] Yes TAKE 2 TABLETS (1,000 MG TOTAL) BY MOUTH 2 (TWO) TIMES DAILY WITH A MEAL. Minette Brine, FNP Taking Active   Olopatadine HCl 0.7 % SOLN [id number] Yes Apply 1 drop to eye as needed.  [provider] Taking Active Self  vitamin E 180 MG (400 UNITS) capsule [id number] Yes Take 400 Units by mouth daily.  [provider] Taking Active           Patient Active Problem List   Diagnosis Date Noted  . Anxiety   . Chronic rhinitis 04/24/2019  . Keloid 10/10/2018  . Diabetes type 2, controlled ([place]) 10/10/2018  . Depression 01/02/2017  . History of DVT (deep vein thrombosis) 01/02/2017  . Chronic pain syndrome 01/02/2017  . Cocaine abuse ([place]) 01/02/2017  . Dysphagia   . Adjustment insomnia   . Distal radius fracture, right 08/16/2016  . Postconcussion syndrome 12/30/2014  . Tremor 03/11/2014  . 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 Hiatal hernia 03/21/2013  . Anemia, iron deficiency 01/29/2013  . Spasm of muscle 09/02/2012  . Anemia 08/28/2012  . Mild intermittent asthma 08/27/2012  . Chronic back pain   . Pernicious anemia 10/30/2011  . DEGENERATIVE DISC DISEASE 08/18/2010  . Asthma with  bronchitis 07/28/2010  . Recurrent urticaria 07/28/2010    Immunization History  Administered Date(s) Administered  . Tdap 04/25/2016    Conditions to be addressed/monitored:  Diabetes, Depression and Anxiety  Care  Plan : [place]  Updates made by Mayford Knife, RPH since 03/23/2021 12:00 AM    Problem: DM, Anxiety/Depression, GERD, Asthma   Priority: High    Long-Range Goal: Disease Management   Start Date: 02/02/2021  Recent Progress: On track  Priority: High  Note:   Current Barriers:  . Does not adhere to prescribed medication regimen   Pharmacist Clinical Goal(s):  [name] Over the next 90 days, patient will verbalize ability to afford treatment regimen . achieve adherence to monitoring guidelines and medication adherence to achieve therapeutic efficacy . maintain control of Diabetes as evidenced by reducing patients A1C  through collaboration with PharmD and provider.    Interventions: . 1:1 collaboration with Minette Brine, FNP regarding development and update of comprehensive plan of care as evidenced by provider attestation and co-signature . Inter-disciplinary care team collaboration (see longitudinal plan of care) . Comprehensive medication review performed; medication list updated in electronic medical record  Diabetes (A1c goal <7%) -Uncontrolled -Current medications: . Farxiga 5 mg tablet daily  . Glipizide 5 mg tablet daily with meals  . Tresiba 10 units daily  o Patient reports that she has not gotten any medication yet  . Metformin 500 mg take 2 tablets by mouth two times daily with a meal  -Current home glucose readings - patient is checking her BS more often Patient reports that the lancets do not fit her device  . fasting glucose:  . post prandial glucose: 271, 268 -Denies hypoglycemic/hyperglycemic symptoms -Current meal patterns:  . breakfast: a cup of oatmeal . lunch: Blueberry waffle, granola bar  . dinner: Chicken in the slow cooker with pepper and paprika and collard greens and a slice of white bread  . snacks: tostito chips with lime sprinkled on them . drinks: drinking a lot of water at least 64 ounces of water.  -Current exercise: patient reports  that she is not exercising  -Educated on A1c and blood sugar goals; Complications of diabetes including kidney damage, retinal damage, and cardiovascular disease; Prevention and management of hypoglycemic episodes; Benefits of routine self-monitoring of blood sugar; Carbohydrate counting and/or plate method -Counseled to check feet daily and get yearly eye exams -Counseled on diet and exercise extensively Recommended to continue current medication -03/22/2021: Patient presented to the office we her medications including ([place]-we are uncertain of why she had this medication, but it was discarded) and [place]. The PCP team asked for my input to help the patient.  Patient demonstrated how she uses the [place]. At that time, I was able to see that the patient was not placing a pen needle on to the pen to inject medication. I did a thorough demonstration and teaching of how to use the [place] pen, including priming, cleaning of the injection site, how to properly place the needle on the pen, and how to properly inject including the sites of the injection. I encouraged the patient to rotate the sites, and to make sure that she injected in either her stomach and rotate the sites. The patient was able to demonstrate to myself and the [place], [place] that she was able to inject the medication properly. She was given additional alcohol swabs and pen needles. I encouraged Ms. Zabawa to follow up with me with any questions of concerns. She  voiced understanding.  Collaborated with provider to reccomend that patient recieve a fast clix lancets. Or will recommend that the patient get a new lancet device. Recommended that patient be seen every thirty days, at this time patient prefers 60 days.   Depression(Goal: reduce remission of depression symptoms) -Controlled -Current treatment: . Diazepam 10 mg take 2 tablets by mouth 3 times daily . Hydroxyzine 100 mg capsule - take 1 capsule by mouth 3 times daily as needed  for itching . Fluoxetine 10 mg capsule by mouth daily -PHQ9: will assess during next office visit  -Educated on Benefits of medication for symptom control -Recommended to continue current medication  GERD (Goal: Reduce symptoms ) -Controlled -Current treatment  . Famotidine 20 mg tablet once per day   -Counseled on diet and exercise extensively Recommended to continue current medication   Health Maintenance -Vaccine gaps: will discuss at next visit  -Current therapy:  [name] Vitamin E - taking 1 capsule daily . Vitamin C - taking 1 daily  . Cranberry-Vitamin C- Vitamin E- take daily  -Educated on Herbal supplement research is limited and benefits usually cannot be proven Supplements may interfere with prescription drugs -Patient is satisfied with current therapy and denies issues -Recommended patient avoid adding any more supplements   Patient Goals/Self-Care Activities . Over the next 90 days, patient will:  - take medications as prescribed focus on medication adherence by using a reminder system for her medication regimen  check glucose at least once per day, document, and provide at future appointments  Follow Up Plan: Telephone follow up appointment with care management team member scheduled for: April  The patient has been provided with contact information for the care management team and has been advised to call with any health related questions or concerns.    Interventions: . 1:1 collaboration with Minette Brine, FNP regarding development and update of comprehensive plan of care as evidenced by provider attestation and co-signature . Inter-disciplinary care team collaboration (see longitudinal plan of care) . Comprehensive medication review performed; medication list updated in electronic medical record         Medication Assistance: None required.  Patient affirms current coverage meets needs.  Patient's preferred pharmacy is:  [id number] [place],  [address] [place]Phone: [phone]: [phone] [place] [address] [place]Phone: [phone]: [phone] Uses pill box? No - none at this time  Pt endorses 80% compliance  We discussed: Benefits of medication synchronization, packaging and delivery as well as enhanced pharmacist oversight with [employer]. Patient decided to: Continue current medication management strategy  Care Plan and Follow Up Patient Decision:  Patient agrees to Care Plan and Follow-up.  Plan: The patient has been provided with contact information for the care management team and has been advised to call with any health related questions or concerns.   VOrlando Penner PharmD Clinical Pharmacist [facility] [phone]

## 2021-03-04 ENCOUNTER — Inpatient Hospital Stay: Payer: Medicare Other

## 2021-03-04 ENCOUNTER — Inpatient Hospital Stay: Payer: Medicare Other | Attending: Hematology & Oncology

## 2021-03-04 ENCOUNTER — Inpatient Hospital Stay: Payer: Medicare Other | Admitting: Family

## 2021-03-08 ENCOUNTER — Ambulatory Visit: Payer: Self-pay

## 2021-03-08 ENCOUNTER — Telehealth: Payer: Medicare Other

## 2021-03-08 DIAGNOSIS — F419 Anxiety disorder, unspecified: Secondary | ICD-10-CM

## 2021-03-08 DIAGNOSIS — E119 Type 2 diabetes mellitus without complications: Secondary | ICD-10-CM | POA: Diagnosis not present

## 2021-03-08 DIAGNOSIS — J45909 Unspecified asthma, uncomplicated: Secondary | ICD-10-CM | POA: Diagnosis not present

## 2021-03-08 DIAGNOSIS — F32A Depression, unspecified: Secondary | ICD-10-CM | POA: Diagnosis not present

## 2021-03-08 DIAGNOSIS — G2582 Stiff-man syndrome: Secondary | ICD-10-CM

## 2021-03-08 DIAGNOSIS — R634 Abnormal weight loss: Secondary | ICD-10-CM

## 2021-03-09 ENCOUNTER — Telehealth: Payer: Self-pay

## 2021-03-09 ENCOUNTER — Telehealth: Payer: Medicare Other

## 2021-03-09 NOTE — Chronic Care Management (AMB) (Signed)
Chronic Care Management   CCM RN Visit Note  03/09/2021 Name: Sherri [name] MRN: [id number] DOB: December 07, 1966  Subjective: Sherri [name] is a 55 y.o. year old female who is a primary care patient of Arnette Felts, FNP. The care management team was consulted for assistance with disease management and care coordination needs.    Engaged with patient by telephone for follow up visit in response to provider referral for case management and/or care coordination services.   Consent to Services:  The patient was given information about Chronic Care Management services, agreed to services, and gave verbal consent prior to initiation of services.  Please see initial visit note for detailed documentation.   Patient agreed to services and verbal consent obtained.   Assessment: Review of patient past medical history, allergies, medications, health status, including review of consultants reports, laboratory and other test data, was performed as part of comprehensive evaluation and provision of chronic care management services.   SDOH (Social Determinants of Health) assessments and interventions performed:  Yes, no acute needs  CCM Care Plan  Allergies  Allergen Reactions  . Ibuprofen Other (See Comments)    Does not take due to hx of renal insufficiency "I have kidney disease"   . Lemon Flavor Swelling    Severe Lip Swelling  FRUIT per pt.    . Amoxicillin Diarrhea and Other (See Comments)  . Tylenol [Acetaminophen] Hives    Cannot take large quantities    Outpatient Encounter Medications as of 03/08/2021  Medication Sig Note  . albuterol (VENTOLIN HFA) 108 (90 Base) MCG/ACT inhaler TAKE 2 PUFFS BY MOUTH EVERY 6 HOURS AS NEEDED FOR WHEEZE OR SHORTNESS OF BREATH   . Ascorbic Acid (VITAMIN C) 1000 MG tablet Take 1,000 mg by mouth daily.   . baclofen (LIORESAL) 20 MG tablet TAKE 1 AND 1/2 TABLETS BY  MOUTH IN THE MORNING AND IN THE EVENING AND 1 TABLET AT MIDDAY 02/02/2021: Taking 1 tablet  three times per day.  . cetirizine (ZYRTEC) 10 MG tablet Take 1 tablet (10 mg total) by mouth daily.   . Cholecalciferol (VITAMIN D3) 10 MCG (400 UNIT) tablet Take 1 each by mouth daily.    . Cranberry-Vitamin C-Vitamin E 4200-20-3 MG-MG-UNIT CAPS Take by mouth.    . cyanocobalamin (,VITAMIN B-12,) 1000 MCG/ML injection Inject 1,000 mcg into the muscle every 3 (three) months.   . dapagliflozin propanediol (FARXIGA) 5 MG TABS tablet Take 1 tablet (5 mg total) by mouth daily before breakfast.   . desloratadine (CLARINEX) 5 MG tablet TAKE 1 TABLET BY MOUTH  DAILY   . diazepam (VALIUM) 10 MG tablet TAKE 2 TABLETS BY MOUTH 3  TIMES DAILY   . diphenhydrAMINE (BENADRYL) 25 MG tablet Take 25 mg by mouth at bedtime as needed for itching or allergies. 01/17/2017: Takes as needed  . EPINEPHRINE 0.3 mg/0.3 mL IJ SOAJ injection INJECT CONTENTS OF 1 PEN AS NEEDED FOR ALLERGIC  RESPONSE AS DIRECTED BY MD. SEEK MEDICAL ATTENTION  AFTER USE.   . famotidine (PEPCID) 20 MG tablet TAKE 1 TABLET BY MOUTH TWICE A DAY   . FLUoxetine (PROZAC) 10 MG capsule TAKE 1 CAPSULE BY MOUTH  DAILY   . gabapentin (NEURONTIN) 300 MG capsule TAKE 1 CAPSULE BY MOUTH TWO TIMES DAILY (Patient taking differently: Take 300 mg by mouth 2 (two) times daily. TAKE 1 CAPSULE BY MOUTH TWO TIMES DAILY)   . glipiZIDE (GLUCOTROL) 5 MG tablet TAKE 1 TABLET BY MOUTH EVERY DAY BEFORE MEALS   .  Glucose Blood (ACCU-CHEK SIMPLICITY TEST STRP VI) by In Vitro route. Insert 1 by subcutaneous route 4 times every day check blood sugar before breakfast, lunch and dinner and bedtime   . hydrOXYzine (VISTARIL) 100 MG capsule TAKE 1 CAPSULE BY MOUTH 3  TIMES DAILY AS NEEDED FOR  ITCHING   . insulin degludec (TRESIBA FLEXTOUCH) 100 UNIT/ML FlexTouch Pen Inject 10 Units into the skin daily.   . metFORMIN (GLUCOPHAGE) 500 MG tablet TAKE 2 TABLETS (1,000 MG TOTAL) BY MOUTH 2 (TWO) TIMES DAILY WITH A MEAL.   [name] Olopatadine HCl 0.7 % SOLN Apply 1 drop to eye as needed.     . vitamin E 180 MG (400 UNITS) capsule Take 400 Units by mouth daily.     No facility-administered encounter medications on file as of 03/08/2021.    Patient Active Problem List   Diagnosis Date Noted  . Anxiety   . Chronic rhinitis 04/24/2019  . Keloid 10/10/2018  . Diabetes type 2, controlled (HCC) 10/10/2018  . Depression 01/02/2017  . History of DVT (deep vein thrombosis) 01/02/2017  . Chronic pain syndrome 01/02/2017  . Cocaine abuse (HCC) 01/02/2017  . Dysphagia   . Adjustment insomnia   . Distal radius fracture, right 08/16/2016  . Postconcussion syndrome 12/30/2014  . Tremor 03/11/2014  . 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 Hiatal hernia 03/21/2013  . Anemia, iron deficiency 01/29/2013  . Spasm of muscle 09/02/2012  . Anemia 08/28/2012  . Mild intermittent asthma 08/27/2012  . Chronic back pain   . Pernicious anemia 10/30/2011  . DEGENERATIVE DISC DISEASE 08/18/2010  . Asthma with bronchitis 07/28/2010  . Recurrent urticaria 07/28/2010    Conditions to be addressed/monitored:DMII, Stiff-Person Syndrome, Asthma with bronchitis, GERD  Care Plan : Diabetes Type 2 (Adult)  Updates made by [name], RN since 03/09/2021 12:00 AM    Problem: Glycemic Management (Diabetes, Type 2)   Priority: High    Long-Range Goal: Glycemic Management Optimized   Start Date: 01/31/2021  Expected End Date: 08/02/2021  Recent Progress: On track  Priority: High  Note:   Current Barriers:  [name] Knowledge Deficits related to disease process and Self Health management of Asthma . 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 Financial difficulty buying food  . Relationship strain with boyfriend  Nurse Case Manager Clinical Goal(s):  [name] Over the next 180 days, patient will work with the CCM team and PCP to address needs related to disease education and support for improved Self Health management of Diabetes as evidence by  patient will lower her A1c to 7.5 or below  CCM RN CM Interventions:  03/08/21 successful call completed with patient  . Determined patient is ill today with cough, fatigue and elevated blood sugar taken during call and noted to be 465 . Assessed for medication adherence, patient states she is taking DM meds on time as directed including the [place] she picked up from the office; Reviewed dose/frequency and she has taken her am doses  . Determined PCP will provide Tresiba samples once they are available in the office, patient is aware  . Reviewed and discussed patient has not used her Albuterol inhaler since developing a cough, she will use as directed . Educated on importance to increase water intake to help thin mucous and keep her well hydrated . Discussed interventions that may be needed through an ED visit today to help bring sugars down and provide hydration and possible chest xray if  appropriate . Determined patient will consider having her sister or daughter drive her to the ED . Educated on basic disease process of DM; Educated on potential complications that may occur for uncontrolled DM . Advised patient, providing education and rationale, to check cbg daily before meals and at bedtime and to record, calling the CCM team and or PCP for findings outside established parameters  . Collaborated with PCP regarding patient's status today and instructions given by this RN CM  . Discussed plans with patient for ongoing care management follow up and provided patient with direct contact information for care management team 03/09/21 successful outbound call placed to patient  . Determined patient did not go to the ED yesterday due to stating her symptoms improved after using her Albuterol inhaler and increasing her fluids . During today's call patient reported FBS this am to be 259 with current BS noted to be 261; temp 97.0, cough has subsided, patient denies shortness of breath or chest pain and is  able to tolerate oral intake of water, medications and meals . Offered patient a follow up with PCP Arnette Felts FNP per PCP recommendation and patient declines at this time . Discussed next PCP follow up appointment is scheduled for 03/31/21 @ 2:45 PM  . Discussed plans with patient for ongoing care management follow up and provided patient with direct contact information for care management team Patient Self Care Activities:  . Continue to follow DM dietary and exercise recommendations . Continue to monitor CBG's daily before meals and at bedtime, alerting CCM team and or PCP of abnormal results  . Work with the embedded Pharm D for evaluation of current treatment of DM and consideration for treatment change . Discuss potential referral to Endocrinology if A1c and daily glucose levels remain uncontrolled  . Pick up Farxiga samples from PCP office and start this medication as directed . Expect a call from PCP office to schedule 1:1 visit for instruction on use of Tresiba . Increase your water intake and strictly adhere to low carb diet, especially while BS are elevated . Take all of your DM medications exactly as prescribed   Next Follow up Call: 04/11/21    Plan:Telephone follow up appointment with care management team member scheduled for:  04/11/21  Delsa Sale, RN, BSN, CCM  Care Management Coordinator [facility]  Direct Phone: [phone]

## 2021-03-09 NOTE — Patient Instructions (Signed)
Visit Information Goals Addressed    Other   .  Diabetes treatment optimized   Not on track     Timeframe:  Long-Range Goal Priority:  High Start Date: 01/31/21/                            Expected End Date:  07/31/21  Follow Up Date: 04/11/21   . Continue to follow DM dietary and exercise recommendations . Continue to monitor CBG's daily before meals and at bedtime, alerting CCM team and or PCP of abnormal results  . Work with the embedded Pharm D for evaluation of current treatment of DM and consideration for treatment change . Discuss potential referral to Endocrinology if A1c and daily glucose levels remain uncontrolled  . Pick up Farxiga samples from PCP office and start this medication as directed . Expect a call from PCP office to schedule 1:1 visit for instruction on use of Tresiba . Increase your water intake and strictly adhere to low carb diet, especially while BS are elevated . Take all of your DM medications exactly as prescribed

## 2021-03-09 NOTE — Telephone Encounter (Cosign Needed)
Chronic Care Management   CCM RN Visit Note  03/09/2021 Name: Sherri [name] MRN: [id number] DOB: July 18, 1966  Subjective: Sherri [name] is a 55 y.o. year old female who is a primary care patient of Arnette Felts, FNP. The care management team was consulted for assistance with disease management and care coordination needs.    Engaged with patient by telephone for follow up visit in response to provider referral for case management and/or care coordination services.   Consent to Services:  The patient was given information about Chronic Care Management services, agreed to services, and gave verbal consent prior to initiation of services.  Please see initial visit note for detailed documentation.   Patient agreed to services and verbal consent obtained.   Assessment: Review of patient past medical history, allergies, medications, health status, including review of consultants reports, laboratory and other test data, was performed as part of comprehensive evaluation and provision of chronic care management services.   SDOH (Social Determinants of Health) assessments and interventions performed: No  CCM Care Plan  Allergies  Allergen Reactions  . Ibuprofen Other (See Comments)    Does not take due to hx of renal insufficiency "I have kidney disease"   . Lemon Flavor Swelling    Severe Lip Swelling  FRUIT per pt.    . Amoxicillin Diarrhea and Other (See Comments)  . Tylenol [Acetaminophen] Hives    Cannot take large quantities    Outpatient Encounter Medications as of 03/09/2021  Medication Sig Note  . albuterol (VENTOLIN HFA) 108 (90 Base) MCG/ACT inhaler TAKE 2 PUFFS BY MOUTH EVERY 6 HOURS AS NEEDED FOR WHEEZE OR SHORTNESS OF BREATH   . Ascorbic Acid (VITAMIN C) 1000 MG tablet Take 1,000 mg by mouth daily.   . baclofen (LIORESAL) 20 MG tablet TAKE 1 AND 1/2 TABLETS BY  MOUTH IN THE MORNING AND IN THE EVENING AND 1 TABLET AT MIDDAY 02/02/2021: Taking 1 tablet three times per day.   . cetirizine (ZYRTEC) 10 MG tablet Take 1 tablet (10 mg total) by mouth daily.   . Cholecalciferol (VITAMIN D3) 10 MCG (400 UNIT) tablet Take 1 each by mouth daily.    . Cranberry-Vitamin C-Vitamin E 4200-20-3 MG-MG-UNIT CAPS Take by mouth.    . cyanocobalamin (,VITAMIN B-12,) 1000 MCG/ML injection Inject 1,000 mcg into the muscle every 3 (three) months.   . dapagliflozin propanediol (FARXIGA) 5 MG TABS tablet Take 1 tablet (5 mg total) by mouth daily before breakfast.   . desloratadine (CLARINEX) 5 MG tablet TAKE 1 TABLET BY MOUTH  DAILY   . diazepam (VALIUM) 10 MG tablet TAKE 2 TABLETS BY MOUTH 3  TIMES DAILY   . diphenhydrAMINE (BENADRYL) 25 MG tablet Take 25 mg by mouth at bedtime as needed for itching or allergies. 01/17/2017: Takes as needed  . EPINEPHRINE 0.3 mg/0.3 mL IJ SOAJ injection INJECT CONTENTS OF 1 PEN AS NEEDED FOR ALLERGIC  RESPONSE AS DIRECTED BY MD. SEEK MEDICAL ATTENTION  AFTER USE.   . famotidine (PEPCID) 20 MG tablet TAKE 1 TABLET BY MOUTH TWICE A DAY   . FLUoxetine (PROZAC) 10 MG capsule TAKE 1 CAPSULE BY MOUTH  DAILY   . gabapentin (NEURONTIN) 300 MG capsule TAKE 1 CAPSULE BY MOUTH TWO TIMES DAILY (Patient taking differently: Take 300 mg by mouth 2 (two) times daily. TAKE 1 CAPSULE BY MOUTH TWO TIMES DAILY)   . glipiZIDE (GLUCOTROL) 5 MG tablet TAKE 1 TABLET BY MOUTH EVERY DAY BEFORE MEALS   . Glucose Blood (ACCU-CHEK SIMPLICITY  TEST STRP VI) by In Vitro route. Insert 1 by subcutaneous route 4 times every day check blood sugar before breakfast, lunch and dinner and bedtime   . hydrOXYzine (VISTARIL) 100 MG capsule TAKE 1 CAPSULE BY MOUTH 3  TIMES DAILY AS NEEDED FOR  ITCHING   . insulin degludec (TRESIBA FLEXTOUCH) 100 UNIT/ML FlexTouch Pen Inject 10 Units into the skin daily.   . metFORMIN (GLUCOPHAGE) 500 MG tablet TAKE 2 TABLETS (1,000 MG TOTAL) BY MOUTH 2 (TWO) TIMES DAILY WITH A MEAL.   [name] Olopatadine HCl 0.7 % SOLN Apply 1 drop to eye as needed.    . vitamin E 180 MG  (400 UNITS) capsule Take 400 Units by mouth daily.     No facility-administered encounter medications on file as of 03/09/2021.    Patient Active Problem List   Diagnosis Date Noted  . Anxiety   . Chronic rhinitis 04/24/2019  . Keloid 10/10/2018  . Diabetes type 2, controlled (HCC) 10/10/2018  . Depression 01/02/2017  . History of DVT (deep vein thrombosis) 01/02/2017  . Chronic pain syndrome 01/02/2017  . Cocaine abuse (HCC) 01/02/2017  . Dysphagia   . Adjustment insomnia   . Distal radius fracture, right 08/16/2016  . Postconcussion syndrome 12/30/2014  . Tremor 03/11/2014  . 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 Hiatal hernia 03/21/2013  . Anemia, iron deficiency 01/29/2013  . Spasm of muscle 09/02/2012  . Anemia 08/28/2012  . Mild intermittent asthma 08/27/2012  . Chronic back pain   . Pernicious anemia 10/30/2011  . DEGENERATIVE DISC DISEASE 08/18/2010  . Asthma with bronchitis 07/28/2010  . Recurrent urticaria 07/28/2010   Successful outbound call completed with patient to advise her to pick up [place] samples from the office today or tomorrow. Discussed PCP is prescribing 10 units daily and the MA will instruct her on how to administer this medication. Patient verbalizes understanding.   Plan:Telephone follow up appointment with care management team member scheduled for:  04/11/21  Delsa Sale, RN, BSN, CCM Care Management Coordinator [facility] Management/[facility]  Direct Phone: [phone]

## 2021-03-10 ENCOUNTER — Encounter: Payer: Self-pay | Admitting: Nurse Practitioner

## 2021-03-10 ENCOUNTER — Ambulatory Visit (INDEPENDENT_AMBULATORY_CARE_PROVIDER_SITE_OTHER): Payer: Medicare Other | Admitting: Nurse Practitioner

## 2021-03-10 ENCOUNTER — Other Ambulatory Visit: Payer: Self-pay

## 2021-03-10 DIAGNOSIS — R059 Cough, unspecified: Secondary | ICD-10-CM

## 2021-03-10 DIAGNOSIS — R262 Difficulty in walking, not elsewhere classified: Secondary | ICD-10-CM | POA: Diagnosis not present

## 2021-03-10 DIAGNOSIS — M6281 Muscle weakness (generalized): Secondary | ICD-10-CM | POA: Diagnosis not present

## 2021-03-10 MED ORDER — BENZONATATE 100 MG PO CAPS: 100.0000 mg | ORAL_CAPSULE | Freq: Four times a day (QID) | ORAL | 1 refills | Status: DC | PRN

## 2021-03-10 NOTE — Progress Notes (Signed)
This visit occurred during the SARS-CoV-2 public health emergency.  Safety protocols were in place, including screening questions prior to the visit, additional usage of staff PPE, and extensive cleaning of exam room while observing appropriate contact time as indicated for disinfecting solutions.  Subjective:     Patient ID: Sherri [name] , female    DOB: Aug 05, 1966 , 55 y.o.   MRN: [id number]   Chief Complaint  Patient presents with  . Cough    HPI  Visit done outside due to having cough. She has not had her covid vaccine.    Cough This is a chronic problem. The current episode started in the past 7 days (on Saturday). The problem occurs every few hours. The cough is productive of sputum. Pertinent negatives include no chest pain, chills, fever, headaches, nasal congestion, sore throat, shortness of breath or wheezing. She has tried ipratropium inhaler for the symptoms. The treatment provided mild relief. Her past medical history is significant for asthma.     Past Medical History:  Diagnosis Date  . Angioedema   . Anxiety   . Arthritis   . Asthma   . Benzodiazepine withdrawal (HCC) 08/30/2012  . CAP (community acquired pneumonia) 08/29/2012  . Chronic back pain   . Chronic kidney disease   . Closed nondisplaced fracture of proximal phalanx of right little finger 10/18/2018  . Cocaine abuse (HCC)   . Depression   . Diarrhea   . DVT (deep venous thrombosis) (HCC)   . Environmental allergies   . Fall   . GERD (gastroesophageal reflux disease)   . Heart murmur    has been told once that she has a heart murmur, but has never had any problems  . Hiatal hernia 03/21/2013  . Lumbar herniated disc   . Peripheral vascular disease (HCC)   . Pernicious anemia 10/30/2011  . Postconcussion syndrome 12/30/2014  . Rhabdomyolysis 08/27/2012  . Seasonal allergies   . Stiff person syndrome   . Urticaria      Family History  Problem Relation Age of Onset  . Cancer Mother   . Other Mother    . COPD Father   . Asthma Brother   . Cancer Brother        colon  . Heart attack Brother   . Seizures Brother   . Allergic rhinitis Neg Hx   . Angioedema Neg Hx   . Eczema Neg Hx   . Immunodeficiency Neg Hx   . Urticaria Neg Hx      Current Outpatient Medications:  .  albuterol (VENTOLIN HFA) 108 (90 Base) MCG/ACT inhaler, TAKE 2 PUFFS BY MOUTH EVERY 6 HOURS AS NEEDED FOR WHEEZE OR SHORTNESS OF BREATH, Disp: 8.5 each, Rfl: 1 .  Ascorbic Acid (VITAMIN C) 1000 MG tablet, Take 1,000 mg by mouth daily., Disp: , Rfl:  .  baclofen (LIORESAL) 20 MG tablet, TAKE 1 AND 1/2 TABLETS BY  MOUTH IN THE MORNING AND IN THE EVENING AND 1 TABLET AT MIDDAY, Disp: 360 tablet, Rfl: 3 .  benzonatate (TESSALON PERLES) 100 MG capsule, Take 1 capsule (100 mg total) by mouth every 6 (six) hours as needed., Disp: 30 capsule, Rfl: 1 .  cetirizine (ZYRTEC) 10 MG tablet, Take 1 tablet (10 mg total) by mouth daily., Disp: 90 tablet, Rfl: 2 .  Cholecalciferol (VITAMIN D3) 10 MCG (400 UNIT) tablet, Take 1 each by mouth daily. , Disp: , Rfl:  .  Cranberry-Vitamin C-Vitamin E 4200-20-3 MG-MG-UNIT CAPS, Take by mouth. , Disp: ,  Rfl:  .  cyanocobalamin (,VITAMIN B-12,) 1000 MCG/ML injection, Inject 1,000 mcg into the muscle every 3 (three) months., Disp: , Rfl:  .  dapagliflozin propanediol (FARXIGA) 5 MG TABS tablet, Take 1 tablet (5 mg total) by mouth daily before breakfast., Disp: 90 tablet, Rfl: 1 .  desloratadine (CLARINEX) 5 MG tablet, TAKE 1 TABLET BY MOUTH  DAILY, Disp: 90 tablet, Rfl: 0 .  diazepam (VALIUM) 10 MG tablet, TAKE 2 TABLETS BY MOUTH 3  TIMES DAILY, Disp: 180 tablet, Rfl: 4 .  diphenhydrAMINE (BENADRYL) 25 MG tablet, Take 25 mg by mouth at bedtime as needed for itching or allergies., Disp: , Rfl:  .  EPINEPHRINE 0.3 mg/0.3 mL IJ SOAJ injection, INJECT CONTENTS OF 1 PEN AS NEEDED FOR ALLERGIC  RESPONSE AS DIRECTED BY MD. SEEK MEDICAL ATTENTION  AFTER USE., Disp: 2 each, Rfl: 0 .  famotidine (PEPCID) 20  MG tablet, TAKE 1 TABLET BY MOUTH TWICE A DAY, Disp: 180 tablet, Rfl: 1 .  FLUoxetine (PROZAC) 10 MG capsule, TAKE 1 CAPSULE BY MOUTH  DAILY, Disp: 90 capsule, Rfl: 3 .  gabapentin (NEURONTIN) 300 MG capsule, TAKE 1 CAPSULE BY MOUTH TWO TIMES DAILY (Patient taking differently: Take 300 mg by mouth 2 (two) times daily. TAKE 1 CAPSULE BY MOUTH TWO TIMES DAILY), Disp: 180 capsule, Rfl: 3 .  glipiZIDE (GLUCOTROL) 5 MG tablet, TAKE 1 TABLET BY MOUTH EVERY DAY BEFORE MEALS, Disp: 90 tablet, Rfl: 1 .  Glucose Blood (ACCU-CHEK SIMPLICITY TEST STRP VI), by In Vitro route. Insert 1 by subcutaneous route 4 times every day check blood sugar before breakfast, lunch and dinner and bedtime, Disp: , Rfl:  .  hydrOXYzine (VISTARIL) 100 MG capsule, TAKE 1 CAPSULE BY MOUTH 3  TIMES DAILY AS NEEDED FOR  ITCHING, Disp: 90 capsule, Rfl: 1 .  insulin degludec (TRESIBA FLEXTOUCH) 100 UNIT/ML FlexTouch Pen, Inject 10 Units into the skin daily., Disp: 15 mL, Rfl: 0 .  metFORMIN (GLUCOPHAGE) 500 MG tablet, TAKE 2 TABLETS (1,000 MG TOTAL) BY MOUTH 2 (TWO) TIMES DAILY WITH A MEAL., Disp: 360 tablet, Rfl: 1 .  Olopatadine HCl 0.7 % SOLN, Apply 1 drop to eye as needed. , Disp: , Rfl:  .  vitamin E 180 MG (400 UNITS) capsule, Take 400 Units by mouth daily. , Disp: , Rfl:    Allergies  Allergen Reactions  . Ibuprofen Other (See Comments)    Does not take due to hx of renal insufficiency "I have kidney disease"   . Lemon Flavor Swelling    Severe Lip Swelling  FRUIT per pt.    . Amoxicillin Diarrhea and Other (See Comments)  . Tylenol [Acetaminophen] Hives    Cannot take large quantities     Review of Systems  Constitutional: Negative for chills and fever.  HENT: Negative for sore throat.   Respiratory: Positive for cough. Negative for shortness of breath and wheezing.   Cardiovascular: Negative for chest pain, palpitations and leg swelling.  Neurological: Negative for dizziness and headaches.  Psychiatric/Behavioral:  Negative.      Today's Vitals   03/10/21 1549  PainSc: 0-No pain   There is no height or weight on file to calculate BMI.   Objective:  Physical Exam Constitutional:      General: She is not in acute distress.    Appearance: Normal appearance.  Cardiovascular:     Rate and Rhythm: Normal rate and regular rhythm.     Pulses: Normal pulses.     Heart sounds:  Normal heart sounds. No murmur heard.   Pulmonary:     Effort: Pulmonary effort is normal. No respiratory distress.     Breath sounds: Normal breath sounds. No wheezing.  Neurological:     General: No focal deficit present.     Mental Status: She is alert and oriented to person, place, and time.     Cranial Nerves: No cranial nerve deficit.     Motor: No weakness.  Psychiatric:        Mood and Affect: Mood normal.        Behavior: Behavior normal.        Thought Content: Thought content normal.        Judgment: Judgment normal.         Assessment And Plan:     1. Cough  Intermittent cough, worse at night.   Rx given for tessalon perles  She is encouraged to take her Albuterol inhaler two times a day  If not better by Monday she is to call back to the office, she may need an antibiotic  If symptoms worsen she is to call office or go to ER - Novel Coronavirus, [facility] ([facility]) - benzonatate (TESSALON PERLES) 100 MG capsule; Take 1 capsule (100 mg total) by mouth every 6 (six) hours as needed.  Dispense: 30 capsule; Refill: 1     Patient was given opportunity to ask questions. Patient verbalized understanding of the plan and was able to repeat key elements of the plan. All questions were answered to their satisfaction.  Arnette Felts, FNP   I, Arnette Felts, FNP, have reviewed all documentation for this visit. The documentation on 03/10/21 for the exam, diagnosis, procedures, and orders are all accurate and complete.   IF YOU HAVE BEEN REFERRED TO A SPECIALIST, IT MAY TAKE 1-2 WEEKS TO SCHEDULE/PROCESS THE  REFERRAL. IF YOU HAVE NOT HEARD FROM US/SPECIALIST IN TWO WEEKS, PLEASE GIVE [place] A CALL AT [phone].   THE PATIENT IS ENCOURAGED TO PRACTICE SOCIAL DISTANCING DUE TO THE COVID-19 PANDEMIC.

## 2021-03-11 LAB — SARS-COV-2, NAA 2 DAY TAT

## 2021-03-11 LAB — NOVEL CORONAVIRUS, NAA: SARS-CoV-2, NAA: NOT DETECTED

## 2021-03-14 ENCOUNTER — Telehealth: Payer: Medicare Other

## 2021-03-14 ENCOUNTER — Telehealth: Payer: Self-pay

## 2021-03-14 NOTE — Telephone Encounter (Signed)
  Chronic Care Management   Outreach Note  03/14/2021 Name: Sherri [name] MRN: [id number] DOB: 27-Dec-1965  Referred by: Arnette Felts, FNP Reason for referral : Chronic Care Management (RN CM FU Attempt )   An unsuccessful telephone outreach was attempted today. The patient was referred to the case management team for assistance with care management and care coordination.   Follow Up Plan: A HIPAA compliant phone message was left for the patient providing contact information and requesting a return call. Telephone follow up appointment with care management team member scheduled for: 04/11/21  Delsa Sale, RN, BSN, CCM Care Management Coordinator [facility] Care Management/[facility]  Direct Phone: [phone]

## 2021-03-15 NOTE — Patient Instructions (Signed)
Visit Information It was great speaking with you today!  Please let me know if you have any questions about our visit.  Goals Addressed            This Visit's Progress   . Manage My Medicine       Timeframe:  Long-Range Goal Priority:  High Start Date: 01/31/21                          Expected End Date: 07/31/21                    Follow Up Date : 05/03/2021 - call for medicine refill 2 or 3 days before it runs out - call if I am sick and can't take my medicine - keep a list of all the medicines I take; vitamins and herbals too - use a pillbox to sort medicine - use an alarm clock or phone to remind me to take my medicine    Why is this important?   . These steps will help you keep on track with your medicines.          Pharmacist Clinical Goal(s):   Over the next 90 days, patient will verbalize ability to afford treatment regimen  achieve adherence to monitoring guidelines and medication adherence to achieve therapeutic efficacy  maintain control of Diabetes as evidenced by reducing patients A1C  through collaboration with PharmD and provider.    Interventions:  1:1 collaboration with Arnette Felts, FNP regarding development and update of comprehensive plan of care as evidenced by provider attestation and co-signature  Inter-disciplinary care team collaboration (see longitudinal plan of care)  Comprehensive medication review performed; medication list updated in electronic medical record  Diabetes (A1c goal <7%) -Uncontrolled -Current medications:  Farxiga 5 mg tablet daily   Glipizide 5 mg tablet daily with meals   Tresiba 10 units daily  ? Patient reports that she has not gotten any medication yet   Metformin 500 mg take 2 tablets by mouth two times daily with a meal  -Current home glucose readings - patient is checking her BS more often Patient reports that the lancets do not fit her device   fasting glucose:   post prandial glucose: 271,  268 -Denies hypoglycemic/hyperglycemic symptoms -Current meal patterns:   breakfast: a cup of oatmeal  lunch: Blueberry waffle, granola bar   dinner: Chicken in the slow cooker with pepper and paprika and collard greens and a slice of white bread   snacks: tostito chips with lime sprinkled on them  drinks: drinking a lot of water at least 64 ounces of water.  -Current exercise: patient reports that she is not exercising  -Educated on A1c and blood sugar goals; Complications of diabetes including kidney damage, retinal damage, and cardiovascular disease; Prevention and management of hypoglycemic episodes; Benefits of routine self-monitoring of blood sugar; Carbohydrate counting and/or plate method -Counseled to check feet daily and get yearly eye exams -Counseled on diet and exercise extensively Recommended to continue current medication Collaborated with provider to reccomend that patient recieve a fast clix lancets. Or will recommend that the patient get a new lancet device. Recommended that patient be seen every thirty days, at this time patient prefers 60 days.   Depression(Goal: reduce remission of depression symptoms) -Controlled -Current treatment:  Diazepam 10 mg take 2 tablets by mouth 3 times daily  Hydroxyzine 100 mg capsule - take 1 capsule by mouth 3 times daily as  needed for itching  Fluoxetine 10 mg capsule by mouth daily -PHQ9: will assess during next office visit  -Educated on Benefits of medication for symptom control -Recommended to continue current medication  GERD (Goal: Reduce symptoms ) -Controlled -Current treatment   Famotidine 20 mg tablet once per day   -Counseled on diet and exercise extensively Recommended to continue current medication   Health Maintenance -Vaccine gaps: will discuss at next visit  -Current therapy:   Vitamin E - taking 1 capsule daily  Vitamin C - taking 1 daily   Cranberry-Vitamin C- Vitamin E- take daily   -Educated on Herbal supplement research is limited and benefits usually cannot be proven Supplements may interfere with prescription drugs -Patient is satisfied with current therapy and denies issues -Recommended patient avoid adding any more supplements   Patient Goals/Self-Care Activities  Over the next 90 days, patient will:  - take medications as prescribed focus on medication adherence by using a reminder system for her medication regimen  check glucose at least once per day, document, and provide at future appointments  Follow Up Plan: Telephone follow up appointment with care management team member scheduled for: April  The patient has been provided with contact information for the care management team and has been advised to call with any health related questions or concerns.    Interventions:  1:1 collaboration with Arnette Felts, FNP regarding development and update of comprehensive plan of care as evidenced by provider attestation and co-signature  Inter-disciplinary care team collaboration (see longitudinal plan of care)  Comprehensive medication review performed; medication list updated in electronic medical record  Patient agreed to services and verbal consent obtained.   The patient verbalized understanding of instructions, educational materials, and care plan provided today and agreed to receive a mailed copy of patient instructions, educational materials, and care plan.   Cherylin Mylar, PharmD Clinical Pharmacist [facility] [phone]

## 2021-03-16 ENCOUNTER — Telehealth: Payer: Self-pay

## 2021-03-16 NOTE — Telephone Encounter (Signed)
Patient called wanting verify her results and make sure she understood them.   I returned her  Call and left her a vm letting her know she is negative for covid YL,RMA

## 2021-03-18 ENCOUNTER — Ambulatory Visit: Payer: Medicare Other

## 2021-03-18 DIAGNOSIS — E119 Type 2 diabetes mellitus without complications: Secondary | ICD-10-CM

## 2021-03-18 DIAGNOSIS — J45909 Unspecified asthma, uncomplicated: Secondary | ICD-10-CM

## 2021-03-18 NOTE — Patient Instructions (Signed)
Social Worker Visit Information  Goals we discussed today:  Goals Addressed            This Visit's Progress   . Work with SW to manage care coordination needs       Timeframe:  Long-Range Goal Priority:  Low Start Date:  12.14.21                           Expected End Date:  4.13.22                     Next expected date of contact: 3.28.22  Patient Goals/Self-Care Activities . Over the next 30 days, patient will:   - Patient will self administer medications as prescribed Patient will attend all scheduled provider appointments Patient will call provider office for new concerns or questions Contact SW as needed prior to next scheduled call          Follow Up Plan: SW will follow up with patient by phone over the next week   [name], BSW, CDP Social Worker, Certified Dementia Practitioner [facility] Care Management [phone]

## 2021-03-18 NOTE — Chronic Care Management (AMB) (Signed)
Chronic Care Management    Social Work Note  03/18/2021 Name: Naiomy Watters MRN: [id number] DOB: 1966/07/26  Jamarria Real is a 55 y.o. year old female who is a primary care patient of Arnette Felts, FNP. The CCM team was consulted to assist the patient with chronic disease management and/or care coordination needs related to: [employer] .   Engaged with patient by telephone for follow up visit in response to provider referral for social work chronic care management and care coordination services.   Consent to Services:  The patient was given information about Chronic Care Management services, agreed to services, and gave verbal consent prior to initiation of services.  Please see initial visit note for detailed documentation.   Patient agreed to services and consent obtained.   Assessment: Review of patient past medical history, allergies, medications, and health status, including review of relevant consultants reports was performed today as part of a comprehensive evaluation and provision of chronic care management and care coordination services.     SDOH (Social Determinants of Health) assessments and interventions performed:    Advanced Directives Status: Not addressed in this encounter.  CCM Care Plan  Allergies  Allergen Reactions  . Ibuprofen Other (See Comments)    Does not take due to hx of renal insufficiency "I have kidney disease"   . Lemon Flavor Swelling    Severe Lip Swelling  FRUIT per pt.    . Amoxicillin Diarrhea and Other (See Comments)  . Tylenol [Acetaminophen] Hives    Cannot take large quantities    Outpatient Encounter Medications as of 03/18/2021  Medication Sig Note  . albuterol (VENTOLIN HFA) 108 (90 Base) MCG/ACT inhaler TAKE 2 PUFFS BY MOUTH EVERY 6 HOURS AS NEEDED FOR WHEEZE OR SHORTNESS OF BREATH   . Ascorbic Acid (VITAMIN C) 1000 MG tablet Take 1,000 mg by mouth daily.   . baclofen (LIORESAL) 20 MG tablet TAKE 1 AND 1/2 TABLETS BY   MOUTH IN THE MORNING AND IN THE EVENING AND 1 TABLET AT MIDDAY 02/02/2021: Taking 1 tablet three times per day.  . benzonatate (TESSALON PERLES) 100 MG capsule Take 1 capsule (100 mg total) by mouth every 6 (six) hours as needed.   . cetirizine (ZYRTEC) 10 MG tablet Take 1 tablet (10 mg total) by mouth daily.   . Cholecalciferol (VITAMIN D3) 10 MCG (400 UNIT) tablet Take 1 each by mouth daily.    . Cranberry-Vitamin C-Vitamin E 4200-20-3 MG-MG-UNIT CAPS Take by mouth.    . cyanocobalamin (,VITAMIN B-12,) 1000 MCG/ML injection Inject 1,000 mcg into the muscle every 3 (three) months.   . dapagliflozin propanediol (FARXIGA) 5 MG TABS tablet Take 1 tablet (5 mg total) by mouth daily before breakfast.   . desloratadine (CLARINEX) 5 MG tablet TAKE 1 TABLET BY MOUTH  DAILY   . diazepam (VALIUM) 10 MG tablet TAKE 2 TABLETS BY MOUTH 3  TIMES DAILY   . diphenhydrAMINE (BENADRYL) 25 MG tablet Take 25 mg by mouth at bedtime as needed for itching or allergies. 01/17/2017: Takes as needed  . EPINEPHRINE 0.3 mg/0.3 mL IJ SOAJ injection INJECT CONTENTS OF 1 PEN AS NEEDED FOR ALLERGIC  RESPONSE AS DIRECTED BY MD. SEEK MEDICAL ATTENTION  AFTER USE.   . famotidine (PEPCID) 20 MG tablet TAKE 1 TABLET BY MOUTH TWICE A DAY   . FLUoxetine (PROZAC) 10 MG capsule TAKE 1 CAPSULE BY MOUTH  DAILY   . gabapentin (NEURONTIN) 300 MG capsule TAKE 1 CAPSULE BY MOUTH TWO TIMES DAILY (  Patient taking differently: Take 300 mg by mouth 2 (two) times daily. TAKE 1 CAPSULE BY MOUTH TWO TIMES DAILY)   . glipiZIDE (GLUCOTROL) 5 MG tablet TAKE 1 TABLET BY MOUTH EVERY DAY BEFORE MEALS   . Glucose Blood (ACCU-CHEK SIMPLICITY TEST STRP VI) by In Vitro route. Insert 1 by subcutaneous route 4 times every day check blood sugar before breakfast, lunch and dinner and bedtime   . hydrOXYzine (VISTARIL) 100 MG capsule TAKE 1 CAPSULE BY MOUTH 3  TIMES DAILY AS NEEDED FOR  ITCHING   . insulin degludec (TRESIBA FLEXTOUCH) 100 UNIT/ML FlexTouch Pen Inject 10  Units into the skin daily.   . metFORMIN (GLUCOPHAGE) 500 MG tablet TAKE 2 TABLETS (1,000 MG TOTAL) BY MOUTH 2 (TWO) TIMES DAILY WITH A MEAL.   [name] Olopatadine HCl 0.7 % SOLN Apply 1 drop to eye as needed.    . vitamin E 180 MG (400 UNITS) capsule Take 400 Units by mouth daily.     No facility-administered encounter medications on file as of 03/18/2021.    Patient Active Problem List   Diagnosis Date Noted  . Anxiety   . Chronic rhinitis 04/24/2019  . Keloid 10/10/2018  . Diabetes type 2, controlled (HCC) 10/10/2018  . Depression 01/02/2017  . History of DVT (deep vein thrombosis) 01/02/2017  . Chronic pain syndrome 01/02/2017  . Cocaine abuse (HCC) 01/02/2017  . Dysphagia   . Adjustment insomnia   . Distal radius fracture, right 08/16/2016  . Postconcussion syndrome 12/30/2014  . Tremor 03/11/2014  . 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 Hiatal hernia 03/21/2013  . Anemia, iron deficiency 01/29/2013  . Spasm of muscle 09/02/2012  . Anemia 08/28/2012  . Mild intermittent asthma 08/27/2012  . Chronic back pain   . Pernicious anemia 10/30/2011  . DEGENERATIVE DISC DISEASE 08/18/2010  . Asthma with bronchitis 07/28/2010  . Recurrent urticaria 07/28/2010    Conditions to be addressed/monitored: DMII and Asthma; Limited access to food  Care Plan : Social Work Care Plan  Updates made by [name] since 03/18/2021 12:00 AM    Problem: Care Coordination   Priority: Low  Onset Date: 12/07/2020    Long-Range Goal: Collaborate with RN Care Manager to perform appropriate assessments to assist with care coordination needs   Start Date: 12/07/2020  Expected End Date: 04/06/2021  Recent Progress: On track  Priority: Low  Note:   Current Barriers:  . Chronic disease management support and education needs related to DMII and Asthma   . Financial constraints related to costs of daily living . Limited social support . Limited access to food  Social Worker Clinical Goal(s):   [name] Over the next 120 days, patient will work with SW to identify and address any acute and/or chronic care coordination needs related to the self health management of DMII and Asthma    CCM SW Interventions:  . 1:1 collaboration with Arnette Felts, FNP regarding development and update of comprehensive plan of care as evidenced by provider attestation and co-signature . Inter-disciplinary care team collaboration (see longitudinal plan of care) . Voice message received from the patient who reports concerns surrounding insulin administration  . Successful outbound call placed to the patient . Determined the patient is still experiencing high blood sugar readings over 400 even with added Farxiga and Insulin . Patient indicated she is concerned she is not administering her insulin correctly . Advised the patient SW is unable to assist with medication administration questions but this SW would reach out to  other team members to request follow up . Informed the patient that Delsa Sale RN Care Manager is out of the office today but SW would see if a member of the pharmacy team could contact the patient . Advised the patient if she does not receive a call and has ongoing questions or concerns she is to call the on call physician - patient stated understanding . Advised the patient if she begins to experience side effects or concerns due to high blood sugar she is to go to urgent care or the ED - patient stated understanding . Collaboration with pharmacy team and patients primary care provider via in-basket message to request patient follow up Patient Goals/Self-Care Activities . Over the next 30 days, patient will:   - Patient will self administer medications as prescribed Patient will attend all scheduled provider appointments Patient will call provider office for new concerns or questions Contact SW as needed prior to next scheduled call  Follow Up Plan:  SW will follow up with the patient over the  next week       Follow Up Plan: SW will follow up with patient by phone over the next week.      [name], BSW, CDP Social Worker, Certified Dementia Practitioner [facility] Care Management [phone]  Total time spent performing care coordination and/or care management activities with the patient by phone or face to face = 12 minutes.

## 2021-03-21 ENCOUNTER — Telehealth: Payer: Self-pay

## 2021-03-21 ENCOUNTER — Telehealth: Payer: Medicare Other

## 2021-03-21 NOTE — Telephone Encounter (Signed)
  Chronic Care Management   Outreach Note  03/21/2021 Name: Arushi Partridge MRN: [id number] DOB: [birth year]  Referred by: Arnette Felts, FNP Reason for referral : Chronic Care Management   An unsuccessful telephone outreach was attempted today. The patient was referred to the case management team for assistance with care management and care coordination.   Follow Up Plan: A HIPAA compliant phone message was left for the patient providing contact information and requesting a return call.  The care management team will reach out to the patient again over the next 21 days.   [name], BSW, CDP Social Worker, Certified Dementia Practitioner [facility] Care Management [phone]

## 2021-03-22 ENCOUNTER — Ambulatory Visit (INDEPENDENT_AMBULATORY_CARE_PROVIDER_SITE_OTHER): Payer: Medicare Other

## 2021-03-22 ENCOUNTER — Other Ambulatory Visit: Payer: Self-pay

## 2021-03-22 ENCOUNTER — Telehealth: Payer: Self-pay

## 2021-03-22 VITALS — BP 116/74 | HR 102 | Temp 98.1°F | Ht 62.0 in | Wt 87.6 lb

## 2021-03-22 DIAGNOSIS — F1411 Cocaine abuse, in remission: Secondary | ICD-10-CM

## 2021-03-22 DIAGNOSIS — E119 Type 2 diabetes mellitus without complications: Secondary | ICD-10-CM

## 2021-03-22 DIAGNOSIS — Z79899 Other long term (current) drug therapy: Secondary | ICD-10-CM

## 2021-03-22 LAB — POCT URINALYSIS DIPSTICK
Bilirubin, UA: NEGATIVE
Glucose, UA: POSITIVE — AB
Ketones, UA: NEGATIVE
Leukocytes, UA: NEGATIVE
Nitrite, UA: NEGATIVE
Protein, UA: NEGATIVE
Spec Grav, UA: <=1.005 — AB (ref 1.010–1.025)
Urobilinogen, UA: 0.2 E.U./dL
pH, UA: 5 (ref 5.0–8.0)

## 2021-03-22 LAB — POCT UA - MICROALBUMIN

## 2021-03-22 NOTE — Telephone Encounter (Signed)
CPP Patient Call:  Contacted patient in regards to her request for help with insulin injection instructions. Patient is scheduled to come in the office today to see Lolita Cram and she reports that she took her insulin this morning around 4 am. She reports that she was concerned about the insulin going in but was not specific. She reports that she has been moving the sites around. I let her know that we can speak when she comes in today, and I will schedule an appointment for April to follow up with her earlier. This month during our visit she reported that she preferred talking May but when we spoke today she said she was okay with meeting in April. Will follow up with an appointment via telehealth in April.   Time spent in completing the following duties was 14 minutes.   Cherylin Mylar, PharmD Clinical Pharmacist [facility] [phone]

## 2021-03-22 NOTE — Progress Notes (Signed)
Pt is here for insulin teaching.

## 2021-03-23 ENCOUNTER — Other Ambulatory Visit: Payer: Self-pay | Admitting: *Deleted

## 2021-03-23 ENCOUNTER — Telehealth: Payer: Self-pay

## 2021-03-23 ENCOUNTER — Telehealth: Payer: Medicare Other

## 2021-03-23 MED ORDER — DIAZEPAM 10 MG PO TABS: ORAL_TABLET | ORAL | 4 refills | Status: DC

## 2021-03-23 NOTE — Telephone Encounter (Signed)
  Chronic Care Management   Outreach Note  03/23/2021 Name: Sherri [name] MRN: [id number] DOB: 1966-05-17  Referred by: Arnette Felts, FNP Reason for referral : Chronic Care Management (RN CM FU Call Attempt )   An unsuccessful telephone outreach was attempted today. The patient was referred to the case management team for assistance with care management and care coordination.   Follow Up Plan: A HIPAA compliant phone message was left for the patient providing contact information and requesting a return call. Telephone follow up appointment with care management team member scheduled for: 04/11/21  Delsa Sale, RN, BSN, CCM Care Management Coordinator [facility] Care Management/[facility]  Direct Phone: [phone]

## 2021-03-25 ENCOUNTER — Other Ambulatory Visit: Payer: Self-pay | Admitting: Nurse Practitioner

## 2021-03-25 DIAGNOSIS — Z8709 Personal history of other diseases of the respiratory system: Secondary | ICD-10-CM

## 2021-03-29 DIAGNOSIS — R262 Difficulty in walking, not elsewhere classified: Secondary | ICD-10-CM | POA: Diagnosis not present

## 2021-03-29 DIAGNOSIS — M79605 Pain in left leg: Secondary | ICD-10-CM | POA: Diagnosis not present

## 2021-03-29 DIAGNOSIS — M6281 Muscle weakness (generalized): Secondary | ICD-10-CM | POA: Diagnosis not present

## 2021-03-29 DIAGNOSIS — M79604 Pain in right leg: Secondary | ICD-10-CM | POA: Diagnosis not present

## 2021-03-31 ENCOUNTER — Ambulatory Visit: Payer: Self-pay | Admitting: Nurse Practitioner

## 2021-04-05 ENCOUNTER — Encounter: Payer: Self-pay | Admitting: Nurse Practitioner

## 2021-04-05 ENCOUNTER — Other Ambulatory Visit: Payer: Self-pay

## 2021-04-05 DIAGNOSIS — E119 Type 2 diabetes mellitus without complications: Secondary | ICD-10-CM

## 2021-04-05 MED ORDER — TRESIBA FLEXTOUCH 100 UNIT/ML ~~LOC~~ SOPN: 15.0000 [IU] | PEN_INJECTOR | Freq: Every day | SUBCUTANEOUS | 0 refills | Status: DC

## 2021-04-06 ENCOUNTER — Telehealth: Payer: Self-pay

## 2021-04-06 ENCOUNTER — Telehealth: Payer: Medicare Other

## 2021-04-06 NOTE — Telephone Encounter (Signed)
  Chronic Care Management   Outreach Note  04/06/2021 Name: Sherri [name] MRN: [id number] DOB: 25-Jan-1966  Referred by: Arnette Felts, FNP Reason for referral : Chronic Care Management (Unsuccessful attempt)   A second unsuccessful telephone outreach was attempted today. The patient was referred to the case management team for assistance with care management and care coordination.   Follow Up Plan: A HIPAA compliant phone message was left for the patient providing contact information and requesting a return call.  The care management team will reach out to the patient again over the next 21 days.   [name], BSW, CDP Social Worker, Certified Dementia Practitioner [facility] Care Management [phone]

## 2021-04-07 DIAGNOSIS — R262 Difficulty in walking, not elsewhere classified: Secondary | ICD-10-CM | POA: Diagnosis not present

## 2021-04-07 DIAGNOSIS — M6281 Muscle weakness (generalized): Secondary | ICD-10-CM | POA: Diagnosis not present

## 2021-04-08 DIAGNOSIS — M79605 Pain in left leg: Secondary | ICD-10-CM | POA: Diagnosis not present

## 2021-04-08 DIAGNOSIS — M79604 Pain in right leg: Secondary | ICD-10-CM | POA: Diagnosis not present

## 2021-04-08 DIAGNOSIS — R262 Difficulty in walking, not elsewhere classified: Secondary | ICD-10-CM | POA: Diagnosis not present

## 2021-04-08 DIAGNOSIS — M6281 Muscle weakness (generalized): Secondary | ICD-10-CM | POA: Diagnosis not present

## 2021-04-11 ENCOUNTER — Telehealth: Payer: Self-pay

## 2021-04-11 ENCOUNTER — Telehealth: Payer: Medicare Other

## 2021-04-11 ENCOUNTER — Inpatient Hospital Stay: Payer: Medicare Other | Admitting: Family

## 2021-04-11 ENCOUNTER — Inpatient Hospital Stay: Payer: Medicare Other

## 2021-04-11 ENCOUNTER — Inpatient Hospital Stay: Payer: Medicare Other | Attending: Hematology & Oncology

## 2021-04-11 NOTE — Telephone Encounter (Signed)
  Chronic Care Management   Outreach Note  04/11/2021 Name: Ranya Fiddler MRN: [id number] DOB: [birth year]  Referred by: Arnette Felts, FNP Reason for referral : Chronic Care Management (#3 RNCM Follow up call attempt)   Third unsuccessful telephone outreach was attempted today. The patient was referred to the case management team for assistance with care management and care coordination. The patient's primary care provider has been notified of our unsuccessful attempts to make or maintain contact with the patient. The care management team is pleased to engage with this patient at any time in the future should he/she be interested in assistance from the care management team.   Follow Up Plan: A HIPAA compliant phone message was left for the patient providing contact information and requesting a return call. Telephone follow up appointment with care management team member scheduled for: 06/10/21  Delsa Sale, RN, BSN, CCM Care Management Coordinator [facility] Care Management/[facility]  Direct Phone: [phone]

## 2021-04-11 NOTE — Chronic Care Management (AMB) (Signed)
Left voicemail to remind patient of upcoming appointment on 04/12/21 at 10:00AM.  Lura Em Clinical Pharmacist Assistant [phone]

## 2021-04-12 ENCOUNTER — Telehealth: Payer: Self-pay

## 2021-04-12 NOTE — Progress Notes (Incomplete)
Chronic Care Management Pharmacy Note  04/12/2021 Name:  Sherri [name] MRN:  [id number] DOB:  1966/01/11  Subjective: Sherri [name] is an 55 y.o. year old female who is a primary patient of Minette Brine, [place].  The CCM team was consulted for assistance with disease management and care coordination needs.    {CCMTELEPHONEFACETOFACE:21091510} for {CCMINITIALFOLLOWUPCHOICE:21091511} in response to provider referral for pharmacy case management and/or care coordination services.   Consent to Services:  {CCMCONSENTOPTIONS:25074}  Patient Care Team: Minette Brine, FNP as PCP - General (General Practice) Little, Claudette Stapler, RN as [place] [facility] [place], [place] manager as Social Worker [place], Kennieth Francois, [facility] (Inactive) (Pharmacist)  Recent office visits: ***  Recent consult visits: [facility] visits: {Hospital DC Yes/No:25215}  Objective:  Lab Results  Component Value Date   CREATININE 1.19 (H) 01/25/2021   BUN 14 01/25/2021   GFRNONAA 51 (L) 01/25/2021   GFRAA 59 (L) 01/25/2021   NA 139 01/25/2021   K 5.2 01/25/2021   CALCIUM 10.5 (H) 01/25/2021   CO2 22 01/25/2021   GLUCOSE 257 (H) 01/25/2021    Lab Results  Component Value Date/Time   HGBA1C 12.3 (H) 01/25/2021 05:23 PM   HGBA1C 6.4 (H) 06/03/2020 05:00 PM   MICROALBUR $RemoveBef'30mg'zuPnFIRFUx$  03/22/2021 11:39 AM   MICROALBUR 30 06/04/2020 12:29 PM    Last diabetic Eye exam: No results found for: HMDIABEYEEXA  Last diabetic Foot exam: No results found for: HMDIABFOOTEX   Lab Results  Component Value Date   CHOL 172 06/03/2020   HDL 75 06/03/2020   LDLCALC 83 06/03/2020   TRIG 76 06/03/2020   CHOLHDL 2.3 06/03/2020    Hepatic Function Latest Ref Rng & Units 01/25/2021 11/16/2020 07/27/2020  Total Protein 6.0 - 8.5 g/dL 6.8 7.0 7.1  Albumin 3.8 - 4.9 g/dL 4.6 4.0 4.5  AST 0 - 40 IU/L 12 10(L) 15  ALT 0 - 32 IU/L $Remov'27 18 21  'xPJgLd$ Alk Phosphatase 44 - 121 IU/L 109 70 72  Total Bilirubin 0.0 - 1.2 mg/dL <0.2 0.2(L)  0.2(L)    Lab Results  Component Value Date/Time   TSH 1.150 06/03/2020 05:00 PM   TSH 1.176 12/14/2015 10:23 AM   TSH 1.200 12/18/2014 05:30 AM    CBC Latest Ref Rng & Units 11/16/2020 11/12/2020 07/27/2020  WBC 4.0 - 10.5 K/uL 7.6 9.2 5.3  Hemoglobin 12.0 - 15.0 g/dL 12.4 13.2 11.7(L)  Hematocrit 36.0 - 46.0 % 37.1 40.2 35.5(L)  Platelets 150 - 400 K/uL 263 258 255    Lab Results  Component Value Date/Time   VD25OH 74.0 12/26/2016 10:02 AM   VD25OH 58.8 08/24/2016 10:11 AM    Clinical ASCVD: {YES/NO:21197} The 10-year ASCVD risk score Mikey Bussing DC Jr., et al., 2013) is: 4%   Values used to calculate the score:     Age: 16 years     Sex: Female     Is Non-Hispanic African American: Yes     Diabetic: Yes     Tobacco smoker: No     Systolic Blood Pressure: 675 mmHg     Is BP treated: No     HDL Cholesterol: 75 mg/dL     Total Cholesterol: 172 mg/dL    Depression screen [facility] 2/9 06/03/2020 02/16/2020 01/15/2020  Decreased Interest 0 0 0  Down, Depressed, Hopeless 0 0 0  PHQ - 2 Score 0 0 0  Altered sleeping 0 - 0  Tired, decreased energy 0 - 0  Change in appetite 0 - 0  Feeling bad or failure about yourself  0 - 0  Trouble concentrating 0 - 0  Moving slowly or fidgety/restless 0 - 0  Suicidal thoughts 0 - 0  PHQ-9 Score 0 - 0  Difficult doing work/chores Not difficult at all - Not difficult at all  Some recent data might be hidden     ***Other: (CHADS2VASc if Afib, MMRC or CAT for COPD, ACT, DEXA)  Social History   Tobacco Use  Smoking Status Never Smoker  Smokeless Tobacco Never Used  Tobacco Comment   Never Used Tobacco   BP Readings from Last 3 Encounters:  03/22/21 116/74  01/25/21 102/60  12/28/20 118/82   Pulse Readings from Last 3 Encounters:  03/22/21 (!) 102  01/25/21 (!) 104  12/28/20 (!) 102   Wt Readings from Last 3 Encounters:  03/22/21 87 lb 9.6 oz (39.7 kg)  01/25/21 86 lb 9.6 oz (39.3 kg)  12/28/20 83 lb (37.6 kg)   BMI Readings from  Last 3 Encounters:  03/22/21 16.02 kg/m  01/25/21 15.74 kg/m  12/28/20 14.70 kg/m    Assessment/Interventions: Review of patient past medical history, allergies, medications, health status, including review of consultants reports, laboratory and other test data, was performed as part of comprehensive evaluation and provision of chronic care management services.   SDOH:  (Social Determinants of Health) assessments and interventions performed: {yes/no:20286}  SDOH Screenings   Alcohol Screen: Not on file  Depression (PHQ2-9): Low Risk   . PHQ-2 Score: 0  Financial Resource Strain: Low Risk   . Difficulty of Paying Living Expenses: Not very hard  Food Insecurity: Food Insecurity Present  . Worried About Programme researcher, broadcasting/film/video in the Last Year: Often true  . Ran Out of Food in the Last Year: Often true  Housing: Low Risk   . Last Housing Risk Score: 0  Physical Activity: Inactive  . Days of Exercise per Week: 0 days  . Minutes of Exercise per Session: 0 min  Social Connections: Not on file  Stress: No Stress Concern Present  . Feeling of Stress : Not at all  Tobacco Use: Low Risk   . Smoking Tobacco Use: Never Smoker  . Smokeless Tobacco Use: Never Used  Transportation Needs: No Transportation Needs  . Lack of Transportation (Medical): No  . Lack of Transportation (Non-Medical): No    CCM Care Plan  Allergies  Allergen Reactions  . Ibuprofen Other (See Comments)    Does not take due to hx of renal insufficiency "I have kidney disease"   . Lemon Flavor Swelling    Severe Lip Swelling  FRUIT per pt.    . Amoxicillin Diarrhea and Other (See Comments)  . Tylenol [Acetaminophen] Hives    Cannot take large quantities    Medications Reviewed Today    Reviewed by Gretta Arab, CMA (Certified Medical Assistant) on 04/05/21 at 1632  Med List Status: <None>  Medication Order Taking? Sig Documenting Provider Last Dose Status Informant  albuterol (VENTOLIN HFA) 108 (90  Base) MCG/ACT inhaler [id number]  TAKE 2 PUFFS BY MOUTH EVERY 6 HOURS AS NEEDED FOR WHEEZE OR SHORTNESS OF Adolphus Birchwood, FNP  Active   Ascorbic Acid (VITAMIN C) 1000 MG tablet [id number] No Take 1,000 mg by mouth daily. [provider] Taking Active   baclofen (LIORESAL) 20 MG tablet [id number] No TAKE 1 AND 1/2 TABLETS BY  MOUTH IN THE MORNING AND IN THE EVENING AND 1 TABLET AT MIDDAY Glean Salvo, NP Taking Active  Med Note Mayford Knife   Wed Feb 02, 2021  2:31 PM) Taking 1 tablet three times per day.  benzonatate (TESSALON PERLES) 100 MG capsule [id number] No Take 1 capsule (100 mg total) by mouth every 6 (six) hours as needed. Minette Brine, FNP Taking Active   cetirizine (ZYRTEC) 10 MG tablet [id number] No Take 1 tablet (10 mg total) by mouth daily. Minette Brine, FNP Taking Active   Cholecalciferol (VITAMIN D3) 10 MCG (400 UNIT) tablet [id number] No Take 1 each by mouth daily.  [provider] Taking Active Self  Cranberry-Vitamin C-Vitamin E 4200-20-3 MG-MG-UNIT CAPS [id number] No Take by mouth.  [provider] Taking Active   cyanocobalamin (,VITAMIN B-12,) 1000 MCG/ML injection [id number] No Inject 1,000 mcg into the muscle every 3 (three) months. [provider] Taking Active Self           Med Note Mel Almond, MEGAN T   Mon Apr 14, 2019  2:24 PM)    dapagliflozin propanediol (FARXIGA) 5 MG TABS tablet [id number] No Take 1 tablet (5 mg total) by mouth daily before breakfast. Minette Brine, FNP Taking Active   desloratadine (CLARINEX) 5 MG tablet [id number] No TAKE 1 TABLET BY MOUTH  DAILY Minette Brine, FNP Taking Active   diazepam (VALIUM) 10 MG tablet [id number]  TAKE 2 TABLETS BY MOUTH 3  TIMES DAILY Suzzanne Cloud, NP  Active   diphenhydrAMINE (BENADRYL) 25 MG tablet [id number] No Take 25 mg by mouth at bedtime as needed for itching or allergies. [provider] Taking Active Self           Med Note Marzetta Merino Jan 17, 2017 12:55 PM) Dewaine Conger as needed  EPINEPHRINE 0.3 mg/0.3 mL IJ SOAJ injection [id number] No INJECT CONTENTS OF 1 PEN AS NEEDED FOR ALLERGIC  RESPONSE AS DIRECTED BY MD. Forgan  AFTER USE. Minette Brine, FNP Taking Active   famotidine (PEPCID) 20 MG tablet [id number] No TAKE 1 TABLET BY MOUTH TWICE A Nolene Ebbs, FNP Taking Active   FLUoxetine (PROZAC) 10 MG capsule [id number] No TAKE 1 CAPSULE BY MOUTH  DAILY Suzzanne Cloud, NP Taking Active   gabapentin (NEURONTIN) 300 MG capsule [id number] No TAKE 1 CAPSULE BY MOUTH TWO TIMES DAILY  Patient taking differently: Take 300 mg by mouth 2 (two) times daily. TAKE 1 CAPSULE BY MOUTH TWO TIMES DAILY   Suzzanne Cloud, NP Taking Active Self  glipiZIDE (GLUCOTROL) 5 MG tablet [id number] No TAKE 1 TABLET BY MOUTH EVERY DAY BEFORE MEALS Minette Brine, FNP Taking Active   Glucose Blood (ACCU-CHEK SIMPLICITY TEST STRP VI) [id number] No by In Vitro route. Insert 1 by subcutaneous route 4 times every day check blood sugar before breakfast, lunch and dinner and bedtime [provider] Taking Active   hydrOXYzine (VISTARIL) 100 MG capsule [id number] No TAKE 1 CAPSULE BY MOUTH 3  TIMES DAILY AS NEEDED FOR  Gerrit Friends, FNP Taking Active   insulin degludec (TRESIBA FLEXTOUCH) 100 UNIT/ML FlexTouch Pen [id number] No Inject 10 Units into the skin daily.  Patient taking differently: Inject 15 Units into the skin daily.   Minette Brine, FNP Taking Active   metFORMIN (GLUCOPHAGE) 500 MG tablet [id number] No TAKE 2 TABLETS (1,000 MG TOTAL) BY MOUTH 2 (TWO) TIMES DAILY WITH A MEAL. Minette Brine, FNP Taking Active   Olopatadine HCl 0.7 % SOLN [id number] No Apply 1 drop to eye as needed.  [provider] Taking Active Self  vitamin  E 180 MG (400 UNITS) capsule [id number] No Take 400 Units by mouth daily.  [provider] Taking Active           Patient Active Problem List   Diagnosis Date Noted  . Anxiety   . Chronic  rhinitis 04/24/2019  . Keloid 10/10/2018  . Diabetes type 2, controlled ([place]) 10/10/2018  . Depression 01/02/2017  . History of DVT (deep vein thrombosis) 01/02/2017  . Chronic pain syndrome 01/02/2017  . Cocaine abuse ([place]) 01/02/2017  . Dysphagia   . Adjustment insomnia   . Distal radius fracture, right 08/16/2016  . Postconcussion syndrome 12/30/2014  . Tremor 03/11/2014  . 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 Hiatal hernia 03/21/2013  . Anemia, iron deficiency 01/29/2013  . Spasm of muscle 09/02/2012  . Anemia 08/28/2012  . Mild intermittent asthma 08/27/2012  . Chronic back pain   . Pernicious anemia 10/30/2011  . DEGENERATIVE DISC DISEASE 08/18/2010  . Asthma with bronchitis 07/28/2010  . Recurrent urticaria 07/28/2010    Immunization History  Administered Date(s) Administered  . Tdap 04/25/2016    Conditions to be addressed/monitored:  {USCCMDZASSESSMENTOPTIONS:23563}  There are no care plans that you recently modified to display for this patient.    Medication Assistance: {MEDASSISTANCEINFO:25044}  Patient's preferred pharmacy is:  [id number] - [address], [place] [address] [place] [address] Phone: [phone] Fax: [phone]  [place], [address], [address] [place], [place] [address] [place] [address] Phone: [phone] Fax: [phone]  Uses pill box? {Yes or If no, why not?:20788} Pt endorses ***% compliance  We discussed: {Pharmacy options:24294} Patient decided to: {US Pharmacy Plan:23885}  Care Plan and Follow Up Patient Decision:  {FOLLOWUP:24991}  Plan: {CM FOLLOW UP PLAN:25073}  ***   Current Barriers:  . {pharmacybarriers:24917} . ***  Pharmacist Clinical Goal(s):  [name] Patient will {PHARMACYGOALCHOICES:24921} through collaboration with PharmD and provider.  . ***  Interventions: . 1:1 collaboration  with Minette Brine, FNP regarding development and update of comprehensive plan of care as evidenced by provider attestation and co-signature . Inter-disciplinary care team collaboration (see longitudinal plan of care) . Comprehensive medication review performed; medication list updated in electronic medical record  {CCM PHARMD DISEASE STATES:25130}  Patient Goals/Self-Care Activities . Patient will:  - {pharmacypatientgoals:24919}  Follow Up Plan: {CM FOLLOW UP XNAT:55732}

## 2021-04-14 ENCOUNTER — Other Ambulatory Visit: Payer: Self-pay | Admitting: Nurse Practitioner

## 2021-04-14 ENCOUNTER — Telehealth: Payer: Medicare Other

## 2021-04-14 ENCOUNTER — Telehealth: Payer: Self-pay

## 2021-04-14 DIAGNOSIS — E119 Type 2 diabetes mellitus without complications: Secondary | ICD-10-CM

## 2021-04-14 MED ORDER — TRESIBA FLEXTOUCH 100 UNIT/ML ~~LOC~~ SOPN: 15.0000 [IU] | PEN_INJECTOR | Freq: Every day | SUBCUTANEOUS | 0 refills | Status: DC

## 2021-04-14 NOTE — Telephone Encounter (Signed)
  Chronic Care Management   Outreach Note  04/14/2021 Name: Sherri [name] MRN: [id number] DOB: 05/14/1966  Referred by: Arnette Felts, FNP Reason for referral : Chronic Care Management (Inbound Call from patient )   Received a voice message from patient stating she is returning my phone call and would like a call back. An unsuccessful telephone outreach was attempted today. The patient was referred to the case management team for assistance with care management and care coordination.   Follow Up Plan: A HIPAA compliant phone message was left for the patient providing contact information and requesting a return call. Telephone follow up appointment with care management team member scheduled for: 06/10/21  Delsa Sale, RN, BSN, CCM Care Management Coordinator [facility] Care Management/[facility]  Direct Phone: [phone]

## 2021-04-15 DIAGNOSIS — R262 Difficulty in walking, not elsewhere classified: Secondary | ICD-10-CM | POA: Diagnosis not present

## 2021-04-15 DIAGNOSIS — M6281 Muscle weakness (generalized): Secondary | ICD-10-CM | POA: Diagnosis not present

## 2021-04-19 ENCOUNTER — Other Ambulatory Visit: Payer: Self-pay

## 2021-04-19 DIAGNOSIS — E119 Type 2 diabetes mellitus without complications: Secondary | ICD-10-CM

## 2021-04-19 MED ORDER — TRESIBA FLEXTOUCH 100 UNIT/ML ~~LOC~~ SOPN: 15.0000 [IU] | PEN_INJECTOR | Freq: Every day | SUBCUTANEOUS | 0 refills | Status: DC

## 2021-04-20 ENCOUNTER — Other Ambulatory Visit: Payer: Self-pay

## 2021-04-20 DIAGNOSIS — E119 Type 2 diabetes mellitus without complications: Secondary | ICD-10-CM

## 2021-04-20 MED ORDER — EASY TOUCH PEN NEEDLES 31G X 8 MM MISC: 2 refills | Status: DC

## 2021-04-25 ENCOUNTER — Ambulatory Visit: Payer: Medicare Other | Admitting: Nurse Practitioner

## 2021-04-27 ENCOUNTER — Ambulatory Visit: Payer: Self-pay

## 2021-04-27 ENCOUNTER — Telehealth: Payer: Medicare Other

## 2021-04-27 DIAGNOSIS — E119 Type 2 diabetes mellitus without complications: Secondary | ICD-10-CM

## 2021-04-27 DIAGNOSIS — J45909 Unspecified asthma, uncomplicated: Secondary | ICD-10-CM

## 2021-04-27 DIAGNOSIS — R634 Abnormal weight loss: Secondary | ICD-10-CM

## 2021-04-27 NOTE — Patient Instructions (Signed)
Social Worker Visit Information  Goals we discussed today:  Goals Addressed            This Visit's Progress   . COMPLETED: Healthy Nutrition Achieved       Timeframe:  Short-Term Goal Priority:  High Start Date:    2.14.22                         Expected End Date: 4.15.22                       5.4.22 Goal closed due to inability to maintain patient contact  Patient Goals/Self-Care Activities Over the next 30 days, patient will:   - Patient will self administer medications as prescribed Patient will attend all scheduled provider appointments Patient will call provider office for new concerns or questions Engage with resources to address food insecurity Contact SW as needed prior to next scheduled call    . COMPLETED: Work with SW to manage care coordination needs       Timeframe:  Long-Range Goal Priority:  Low Start Date:  12.14.21                           Expected End Date:  4.13.22                     5.4.22- Goal closed due to inability to maintain patient contact  Patient Goals/Self-Care Activities . Over the next 30 days, patient will:   - Patient will self administer medications as prescribed Patient will attend all scheduled provider appointments Patient will call provider office for new concerns or questions Contact SW as needed prior to next scheduled call         Materials Provided: No. Patient not reached.  Follow Up Plan: No SW follow up planned at this time.    [name], BSW, CDP Social Worker, Certified Dementia Practitioner [facility] Care Management [phone]

## 2021-04-27 NOTE — Chronic Care Management (AMB) (Signed)
  Chronic Care Management   Outreach Note  04/27/2021 Name: Sherri [name] MRN: [id number] DOB: 08-15-1966  Referred by: Arnette Felts, FNP Reason for referral : Chronic Care Management (Unsuccessful attempt #3)   Third unsuccessful telephone outreach was attempted today. The patient was referred to the case management team for assistance with care management and care coordination. The patient's primary care provider has been notified of our unsuccessful attempts to make or maintain contact with the patient. The care management team is pleased to engage with this patient at any time in the future should he/she be interested in assistance from the care management team.   Follow Up Plan: No SW follow up planned at this time. The patient will remain active with RN Care Manager and PharmD.  [name], BSW, CDP Social Worker, Certified Dementia Practitioner [facility] Care Management [phone]

## 2021-05-02 ENCOUNTER — Telehealth: Payer: Self-pay

## 2021-05-02 NOTE — Chronic Care Management (AMB) (Signed)
Called patient for appointment reminder with Cherylin Mylar [facility] on 05-03-21 at 11:00. Mailbox too full to leave VM.  Malecca Willa Rough [facility] Health Concierge  [phone]

## 2021-05-03 ENCOUNTER — Telehealth: Payer: Self-pay

## 2021-05-03 ENCOUNTER — Ambulatory Visit (INDEPENDENT_AMBULATORY_CARE_PROVIDER_SITE_OTHER): Payer: Medicare Other

## 2021-05-03 DIAGNOSIS — E119 Type 2 diabetes mellitus without complications: Secondary | ICD-10-CM | POA: Diagnosis not present

## 2021-05-03 DIAGNOSIS — J45909 Unspecified asthma, uncomplicated: Secondary | ICD-10-CM

## 2021-05-03 DIAGNOSIS — F32A Depression, unspecified: Secondary | ICD-10-CM | POA: Diagnosis not present
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
## 2021-05-03 NOTE — Telephone Encounter (Signed)
I called patient per provider requested patient did not sound well on the phone while talking to the pharmacist [facility]. We requested that pt check her blood sugar she stated the machine read high. Pt was advised to go to the ER due to her levels being high Janece stated patient may need fluids. Pt declined to go to the ER she said she really just wants to go to sleep. YL,RMA
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
## 2021-05-04 ENCOUNTER — Ambulatory Visit: Payer: Medicare Other | Admitting: Nurse Practitioner

## 2021-05-05 ENCOUNTER — Observation Stay (HOSPITAL_COMMUNITY): Payer: Medicare Other

## 2021-05-05 ENCOUNTER — Emergency Department (HOSPITAL_COMMUNITY): Payer: Medicare Other

## 2021-05-05 ENCOUNTER — Ambulatory Visit (INDEPENDENT_AMBULATORY_CARE_PROVIDER_SITE_OTHER): Payer: Medicare Other | Admitting: Nurse Practitioner

## 2021-05-05 ENCOUNTER — Other Ambulatory Visit: Payer: Self-pay

## 2021-05-05 ENCOUNTER — Observation Stay (HOSPITAL_COMMUNITY)
Admission: EM | Admit: 2021-05-05 | Discharge: 2021-05-06 | Disposition: A | Discharge status: Home / Self Care | Payer: Medicare Other | Attending: Internal Medicine | Admitting: Internal Medicine

## 2021-05-05 ENCOUNTER — Encounter (HOSPITAL_COMMUNITY): Payer: Self-pay | Admitting: Internal Medicine

## 2021-05-05 ENCOUNTER — Encounter: Payer: Self-pay | Admitting: Nurse Practitioner

## 2021-05-05 VITALS — BP 90/57 | HR 103 | Ht 62.0 in | Wt 84.0 lb

## 2021-05-05 DIAGNOSIS — Z79899 Other long term (current) drug therapy: Secondary | ICD-10-CM | POA: Insufficient documentation

## 2021-05-05 DIAGNOSIS — I9589 Other hypotension: Secondary | ICD-10-CM | POA: Diagnosis not present

## 2021-05-05 DIAGNOSIS — E1122 Type 2 diabetes mellitus with diabetic chronic kidney disease: Secondary | ICD-10-CM | POA: Insufficient documentation

## 2021-05-05 DIAGNOSIS — Z20822 Contact with and (suspected) exposure to covid-19: Secondary | ICD-10-CM | POA: Insufficient documentation

## 2021-05-05 DIAGNOSIS — E162 Hypoglycemia, unspecified: Secondary | ICD-10-CM | POA: Diagnosis present

## 2021-05-05 DIAGNOSIS — Z7984 Long term (current) use of oral hypoglycemic drugs: Secondary | ICD-10-CM | POA: Diagnosis not present

## 2021-05-05 DIAGNOSIS — R410 Disorientation, unspecified: Secondary | ICD-10-CM

## 2021-05-05 DIAGNOSIS — E11649 Type 2 diabetes mellitus with hypoglycemia without coma: Principal | ICD-10-CM | POA: Insufficient documentation

## 2021-05-05 DIAGNOSIS — R531 Weakness: Secondary | ICD-10-CM | POA: Diagnosis not present

## 2021-05-05 DIAGNOSIS — R42 Dizziness and giddiness: Secondary | ICD-10-CM | POA: Diagnosis not present

## 2021-05-05 DIAGNOSIS — R9431 Abnormal electrocardiogram [ECG] [EKG]: Secondary | ICD-10-CM | POA: Diagnosis not present

## 2021-05-05 DIAGNOSIS — R400 Somnolence: Secondary | ICD-10-CM | POA: Diagnosis not present

## 2021-05-05 DIAGNOSIS — J4520 Mild intermittent asthma, uncomplicated: Secondary | ICD-10-CM | POA: Diagnosis not present

## 2021-05-05 DIAGNOSIS — Z794 Long term (current) use of insulin: Secondary | ICD-10-CM | POA: Insufficient documentation

## 2021-05-05 DIAGNOSIS — R0902 Hypoxemia: Secondary | ICD-10-CM | POA: Diagnosis not present

## 2021-05-05 DIAGNOSIS — E119 Type 2 diabetes mellitus without complications: Secondary | ICD-10-CM

## 2021-05-05 DIAGNOSIS — Y900 Blood alcohol level of less than 20 mg/100 ml: Secondary | ICD-10-CM | POA: Diagnosis not present

## 2021-05-05 DIAGNOSIS — N189 Chronic kidney disease, unspecified: Secondary | ICD-10-CM | POA: Insufficient documentation

## 2021-05-05 DIAGNOSIS — M795 Residual foreign body in soft tissue: Secondary | ICD-10-CM

## 2021-05-05 DIAGNOSIS — M419 Scoliosis, unspecified: Secondary | ICD-10-CM | POA: Diagnosis not present

## 2021-05-05 DIAGNOSIS — N179 Acute kidney failure, unspecified: Secondary | ICD-10-CM | POA: Diagnosis not present

## 2021-05-05 DIAGNOSIS — R4182 Altered mental status, unspecified: Secondary | ICD-10-CM | POA: Diagnosis not present

## 2021-05-05 DIAGNOSIS — T189XXA Foreign body of alimentary tract, part unspecified, initial encounter: Secondary | ICD-10-CM | POA: Diagnosis not present

## 2021-05-05 DIAGNOSIS — I959 Hypotension, unspecified: Secondary | ICD-10-CM | POA: Diagnosis not present

## 2021-05-05 DIAGNOSIS — D649 Anemia, unspecified: Secondary | ICD-10-CM | POA: Diagnosis present

## 2021-05-05 DIAGNOSIS — R29818 Other symptoms and signs involving the nervous system: Secondary | ICD-10-CM | POA: Diagnosis not present

## 2021-05-05 DIAGNOSIS — E161 Other hypoglycemia: Secondary | ICD-10-CM | POA: Diagnosis not present

## 2021-05-05 DIAGNOSIS — R4781 Slurred speech: Secondary | ICD-10-CM | POA: Diagnosis not present

## 2021-05-05 DIAGNOSIS — R29810 Facial weakness: Secondary | ICD-10-CM | POA: Diagnosis not present

## 2021-05-05 LAB — COMPREHENSIVE METABOLIC PANEL
ALT: 81 U/L — ABNORMAL HIGH (ref 0–44)
AST: 24 U/L (ref 15–41)
Albumin: 3.3 g/dL — ABNORMAL LOW (ref 3.5–5.0)
Alkaline Phosphatase: 85 U/L (ref 38–126)
Anion gap: 10 (ref 5–15)
BUN: 31 mg/dL — ABNORMAL HIGH (ref 6–20)
CO2: 24 mmol/L (ref 22–32)
Calcium: 9.4 mg/dL (ref 8.9–10.3)
Chloride: 100 mmol/L (ref 98–111)
Creatinine, Ser: 2.05 mg/dL — ABNORMAL HIGH (ref 0.44–1.00)
GFR, Estimated: 28 mL/min — ABNORMAL LOW (ref >60)
Glucose, Bld: 168 mg/dL — ABNORMAL HIGH (ref 70–99)
Potassium: 3.9 mmol/L (ref 3.5–5.1)
Sodium: 134 mmol/L — ABNORMAL LOW (ref 135–145)
Total Bilirubin: 0.4 mg/dL (ref 0.3–1.2)
Total Protein: 6.2 g/dL — ABNORMAL LOW (ref 6.5–8.1)

## 2021-05-05 LAB — CBC
HCT: 34.2 % — ABNORMAL LOW (ref 36.0–46.0)
Hemoglobin: 11.2 g/dL — ABNORMAL LOW (ref 12.0–15.0)
MCH: 28.6 pg (ref 26.0–34.0)
MCHC: 32.7 g/dL (ref 30.0–36.0)
MCV: 87.5 fL (ref 80.0–100.0)
Platelets: 219 10*3/uL (ref 150–400)
RBC: 3.91 MIL/uL (ref 3.87–5.11)
RDW: 11.8 % (ref 11.5–15.5)
WBC: 10.3 10*3/uL (ref 4.0–10.5)
nRBC: 0 % (ref 0.0–0.2)

## 2021-05-05 LAB — RESP PANEL BY RT-PCR (FLU A&B, COVID) ARPGX2
Influenza A by PCR: NEGATIVE
Influenza B by PCR: NEGATIVE
SARS Coronavirus 2 by RT PCR: NEGATIVE

## 2021-05-05 LAB — CBC WITH DIFFERENTIAL/PLATELET
Abs Immature Granulocytes: 0.05 10*3/uL (ref 0.00–0.07)
Basophils Absolute: 0 10*3/uL (ref 0.0–0.1)
Basophils Relative: 0 %
Eosinophils Absolute: 0 10*3/uL (ref 0.0–0.5)
Eosinophils Relative: 0 %
HCT: 34.8 % — ABNORMAL LOW (ref 36.0–46.0)
Hemoglobin: 11.2 g/dL — ABNORMAL LOW (ref 12.0–15.0)
Immature Granulocytes: 1 %
Lymphocytes Relative: 18 %
Lymphs Abs: 1.9 10*3/uL (ref 0.7–4.0)
MCH: 28.9 pg (ref 26.0–34.0)
MCHC: 32.2 g/dL (ref 30.0–36.0)
MCV: 89.9 fL (ref 80.0–100.0)
Monocytes Absolute: 0.5 10*3/uL (ref 0.1–1.0)
Monocytes Relative: 5 %
Neutro Abs: 8.2 10*3/uL — ABNORMAL HIGH (ref 1.7–7.7)
Neutrophils Relative %: 76 %
Platelets: 285 10*3/uL (ref 150–400)
RBC: 3.87 MIL/uL (ref 3.87–5.11)
RDW: 11.8 % (ref 11.5–15.5)
WBC: 10.8 10*3/uL — ABNORMAL HIGH (ref 4.0–10.5)
nRBC: 0 % (ref 0.0–0.2)

## 2021-05-05 LAB — CBG MONITORING, ED
Glucose-Capillary: 169 mg/dL — ABNORMAL HIGH (ref 70–99)
Glucose-Capillary: 34 mg/dL — CL (ref 70–99)
Glucose-Capillary: 133 mg/dL — ABNORMAL HIGH (ref 70–99)
Glucose-Capillary: 61 mg/dL — ABNORMAL LOW (ref 70–99)
Glucose-Capillary: 48 mg/dL — ABNORMAL LOW (ref 70–99)
Glucose-Capillary: 187 mg/dL — ABNORMAL HIGH (ref 70–99)

## 2021-05-05 LAB — AMMONIA: Ammonia: 12 umol/L (ref 9–35)

## 2021-05-05 LAB — ETHANOL: Alcohol, Ethyl (B): <10 mg/dL (ref <10)

## 2021-05-05 LAB — CREATININE, SERUM
Creatinine, Ser: 1.89 mg/dL — ABNORMAL HIGH (ref 0.44–1.00)
GFR, Estimated: 31 mL/min — ABNORMAL LOW (ref >60)

## 2021-05-05 LAB — GLUCOSE, CAPILLARY
Glucose-Capillary: 157 mg/dL — ABNORMAL HIGH (ref 70–99)
Glucose-Capillary: 211 mg/dL — ABNORMAL HIGH (ref 70–99)

## 2021-05-05 MED ORDER — DEXTROSE 50 % IV SOLN
1.0000 | Freq: Once | INTRAVENOUS | Status: AC
  Administered 2021-05-05: 50 mL via INTRAVENOUS
  Filled 2021-05-05: qty 50

## 2021-05-05 MED ORDER — ENOXAPARIN SODIUM 40 MG/0.4ML IJ SOSY
40.0000 mg | PREFILLED_SYRINGE | INTRAMUSCULAR | Status: DC
  Administered 2021-05-05: 40 mg via SUBCUTANEOUS
  Filled 2021-05-05: qty 0.4

## 2021-05-05 MED ORDER — SODIUM CHLORIDE 0.9 % IV SOLN: Freq: Once | INTRAVENOUS | Status: AC

## 2021-05-05 MED ORDER — DEXTROSE 10 % IV SOLN: INTRAVENOUS | Status: DC

## 2021-05-05 MED ORDER — SODIUM CHLORIDE 0.9 % IV BOLUS
1000.0000 mL | Freq: Once | INTRAVENOUS | Status: AC
  Administered 2021-05-05: 1000 mL via INTRAVENOUS

## 2021-05-05 MED ORDER — ENSURE ENLIVE PO LIQD
237.0000 mL | Freq: Two times a day (BID) | ORAL | Status: DC
  Administered 2021-05-06: 237 mL via ORAL

## 2021-05-05 NOTE — ED Notes (Signed)
RN, Anise Salvo notified of blood sugar

## 2021-05-05 NOTE — ED Provider Notes (Addendum)
Emergency Medicine Provider Triage Evaluation Note  Sherri [name] , a 55 y.o. female  was evaluated in triage.  Pt complains of generalized weakness, ams. BS 70. Family felt like she has been altered since today and note that pt has fallen at home.. Ems noted jerking movements.   Review of Systems  Positive: Weakness, ams Negative: nvd  Physical Exam  BP (!) 93/51 (BP Location: Left Arm)   Pulse 94   Temp 98.6 F (37 C) (Oral)   Resp (!) 26   LMP 07/29/2012 (Approximate)   SpO2 100%  Gen:   Awake, no distress   Resp:  Normal effort  MSK:   Moves extremities without difficulty  Other:  Clear speech, no facial droop, moves all extremities, somnolent.  Medical Decision Making  Medically screening exam initiated at 5:39 PM.  Appropriate orders placed.  [name] was informed that the remainder of the evaluation will be completed by another provider, this initial triage assessment does not replace that evaluation, and the importance of remaining in the ED until their evaluation is complete.  5:33 PM stat cBGordered and noted to be 34, 1 amp d50 ordered.  5:37 PM pt now alert and oriented after d50.   Orders placed, nursing advised that pt will need to be prioritized.    Karrie Meres, PA-C 05/05/21 1739    Huntley Demedeiros S, PA-C 05/05/21 1739    Derwood Kaplan, MD 05/06/21 1539

## 2021-05-05 NOTE — Plan of Care (Signed)

## 2021-05-05 NOTE — Progress Notes (Signed)
I,Yamilka Roman [employer] as a Neurosurgeon for [employer], FNP.,have documented all relevant documentation on the behalf of Arnette Felts, FNP,as directed by  Arnette Felts, FNP while in the presence of Arnette Felts, FNP.   This visit occurred during the SARS-CoV-2 public health emergency.  Safety protocols were in place, including screening questions prior to the visit, additional usage of staff PPE, and extensive cleaning of exam room while observing appropriate contact time as indicated for disinfecting solutions.  Subjective:     Patient ID: Sherri [name] , female    DOB: 1966/06/07 , 55 y.o.   MRN: [id number]   Chief Complaint  Patient presents with  . Diabetes    Patient stated she feels dizzy and she has been jerking she stated that started yesterday    HPI  Spoke with her sister Jola Babinski who reports she was talking normal yesterday.  Patient has come in to the office after speaking with her on Tuesday due to the concerns the she had slurring speech noted by the pharmacist. I advised her to go to the ER at that time and she declined, her blood sugar read high. When we spoke to the patient yesterday as she was supposed to come to a visit however she cancelled until today she was speaking a little more clear. According to her sister Jola Babinski she was her normal self. Today she came to her appt an hour late with her sister Reesa Chew who also reports she had a fall prior to her visit.   Wt Readings from Last 3 Encounters: 05/05/21 : 84 lb (38.1 kg) 03/22/21 : 87 lb 9.6 oz (39.7 kg) 01/25/21 : 86 lb 9.6 oz (39.3 kg)   Diabetes She presents for her follow-up diabetic visit. She has type 2 diabetes mellitus. Hypoglycemia symptoms include confusion and dizziness. Pertinent negatives for hypoglycemia include no headaches. Associated symptoms include fatigue. There are no hypoglycemic complications. There are no diabetic complications.     Past Medical History:  Diagnosis Date  . Angioedema    . Anxiety   . Arthritis   . Asthma   . Benzodiazepine withdrawal (HCC) 08/30/2012  . CAP (community acquired pneumonia) 08/29/2012  . Chronic back pain   . Chronic kidney disease   . Closed nondisplaced fracture of proximal phalanx of right little finger 10/18/2018  . Cocaine abuse (HCC)   . Depression   . Diarrhea   . DVT (deep venous thrombosis) (HCC)   . Environmental allergies   . Fall   . GERD (gastroesophageal reflux disease)   . Heart murmur    has been told once that she has a heart murmur, but has never had any problems  . Hiatal hernia 03/21/2013  . Lumbar herniated disc   . Peripheral vascular disease (HCC)   . Pernicious anemia 10/30/2011  . Postconcussion syndrome 12/30/2014  . Rhabdomyolysis 08/27/2012  . Seasonal allergies   . Stiff person syndrome   . Urticaria      Family History  Problem Relation Age of Onset  . Cancer Mother   . Other Mother   . COPD Father   . Asthma Brother   . Cancer Brother        colon  . Heart attack Brother   . Seizures Brother   . Allergic rhinitis Neg Hx   . Angioedema Neg Hx   . Eczema Neg Hx   . Immunodeficiency Neg Hx   . Urticaria Neg Hx      Current Outpatient Medications:  .  albuterol (VENTOLIN HFA) 108 (90 Base) MCG/ACT inhaler, TAKE 2 PUFFS BY MOUTH EVERY 6 HOURS AS NEEDED FOR WHEEZE OR SHORTNESS OF BREATH, Disp: 8.5 each, Rfl: 1 .  Ascorbic Acid (VITAMIN C) 1000 MG tablet, Take 1,000 mg by mouth daily., Disp: , Rfl:  .  baclofen (LIORESAL) 20 MG tablet, TAKE 1 AND 1/2 TABLETS BY  MOUTH IN THE MORNING AND IN THE EVENING AND 1 TABLET AT MIDDAY, Disp: 360 tablet, Rfl: 3 .  benzonatate (TESSALON PERLES) 100 MG capsule, Take 1 capsule (100 mg total) by mouth every 6 (six) hours as needed., Disp: 30 capsule, Rfl: 1 .  cetirizine (ZYRTEC) 10 MG tablet, Take 1 tablet (10 mg total) by mouth daily., Disp: 90 tablet, Rfl: 2 .  Cholecalciferol (VITAMIN D3) 10 MCG (400 UNIT) tablet, Take 1 each by mouth daily. , Disp: , Rfl:  .   Cranberry-Vitamin C-Vitamin E 4200-20-3 MG-MG-UNIT CAPS, Take by mouth. , Disp: , Rfl:  .  cyanocobalamin (,VITAMIN B-12,) 1000 MCG/ML injection, Inject 1,000 mcg into the muscle every 3 (three) months., Disp: , Rfl:  .  dapagliflozin propanediol (FARXIGA) 5 MG TABS tablet, Take 1 tablet (5 mg total) by mouth daily before breakfast., Disp: 90 tablet, Rfl: 1 .  desloratadine (CLARINEX) 5 MG tablet, TAKE 1 TABLET BY MOUTH  DAILY, Disp: 90 tablet, Rfl: 0 .  diazepam (VALIUM) 10 MG tablet, TAKE 2 TABLETS BY MOUTH 3  TIMES DAILY, Disp: 180 tablet, Rfl: 4 .  diphenhydrAMINE (BENADRYL) 25 MG tablet, Take 25 mg by mouth at bedtime as needed for itching or allergies., Disp: , Rfl:  .  EPINEPHRINE 0.3 mg/0.3 mL IJ SOAJ injection, INJECT CONTENTS OF 1 PEN AS NEEDED FOR ALLERGIC  RESPONSE AS DIRECTED BY MD. SEEK MEDICAL ATTENTION  AFTER USE., Disp: 2 each, Rfl: 0 .  famotidine (PEPCID) 20 MG tablet, TAKE 1 TABLET BY MOUTH TWICE A DAY, Disp: 180 tablet, Rfl: 1 .  FLUoxetine (PROZAC) 10 MG capsule, TAKE 1 CAPSULE BY MOUTH  DAILY, Disp: 90 capsule, Rfl: 3 .  gabapentin (NEURONTIN) 300 MG capsule, TAKE 1 CAPSULE BY MOUTH TWO TIMES DAILY (Patient taking differently: Take 300 mg by mouth 2 (two) times daily. TAKE 1 CAPSULE BY MOUTH TWO TIMES DAILY), Disp: 180 capsule, Rfl: 3 .  glipiZIDE (GLUCOTROL) 5 MG tablet, TAKE 1 TABLET BY MOUTH EVERY DAY BEFORE MEALS, Disp: 90 tablet, Rfl: 1 .  Glucose Blood (ACCU-CHEK SIMPLICITY TEST STRP VI), by In Vitro route. Insert 1 by subcutaneous route 4 times every day check blood sugar before breakfast, lunch and dinner and bedtime, Disp: , Rfl:  .  hydrOXYzine (VISTARIL) 100 MG capsule, TAKE 1 CAPSULE BY MOUTH 3  TIMES DAILY AS NEEDED FOR  ITCHING, Disp: 90 capsule, Rfl: 1 .  insulin degludec (TRESIBA FLEXTOUCH) 100 UNIT/ML FlexTouch Pen, Inject 15 Units into the skin daily., Disp: 15 mL, Rfl: 0 .  Insulin Pen Needle (EASY TOUCH PEN NEEDLES) 31G X 8 MM MISC, Use as directed with  Tresiba E11.9, Disp: 30 each, Rfl: 2 .  metFORMIN (GLUCOPHAGE) 500 MG tablet, TAKE 2 TABLETS (1,000 MG TOTAL) BY MOUTH 2 (TWO) TIMES DAILY WITH A MEAL., Disp: 360 tablet, Rfl: 1 .  Olopatadine HCl 0.7 % SOLN, Apply 1 drop to eye as needed. , Disp: , Rfl:  .  vitamin E 180 MG (400 UNITS) capsule, Take 400 Units by mouth daily. , Disp: , Rfl:    Allergies  Allergen Reactions  . Ibuprofen Other (See Comments)  Does not take due to hx of renal insufficiency "I have kidney disease"   . Lemon Flavor Swelling    Severe Lip Swelling  FRUIT per pt.    . Amoxicillin Diarrhea and Other (See Comments)  . Tylenol [Acetaminophen] Hives    Cannot take large quantities     Review of Systems  Constitutional: Positive for fatigue.       She has not eaten today  Respiratory: Negative.   Cardiovascular: Negative.   Gastrointestinal: Negative.  Negative for constipation, diarrhea, nausea and vomiting.  Musculoskeletal:       Weakness  Neurological: Positive for dizziness. Negative for headaches.  Psychiatric/Behavioral: Positive for confusion.     Today's Vitals   05/05/21 1606  BP: (!) 90/57  Pulse: (!) 103  SpO2: 98%  Weight: 84 lb (38.1 kg)  Height: 5\' 2"  (1.575 m)   Body mass index is 15.36 kg/m.   Objective:  Physical Exam Vitals reviewed.  Constitutional:      Appearance: She is ill-appearing and toxic-appearing.  Eyes:     Pupils:     Right eye: Pupil is sluggish.     Left eye: Pupil is sluggish.     Comments: Pupil size 81mm bilaterally  Abdominal:     General: Abdomen is flat. Bowel sounds are normal. There is no distension.     Palpations: Abdomen is soft.     Tenderness: There is no abdominal tenderness.  Neurological:     Mental Status: She is confused.     Motor: Weakness present.     Comments: She is in a wheelchair today but this is not her normal. Speech is slurred at times as well  Psychiatric:        Mood and Affect: Affect is flat.        Speech: Speech is  delayed and slurred.        Behavior: Behavior is slowed.         Assessment And Plan:     1. Type 2 diabetes mellitus without complication, without long-term current use of insulin (HCC)  Her blood sugar today is 74 in the office, she is unable to tell me how much insulin she took.   2. Weakness  She is extremely weak and having difficulty with keeping her head up during her visit  She also is in a wheelchair which is not normal for her  She is unable to hold her arms up mostly the left side  3. Dizziness  EMS was called to transport to the ED due to her lethargy  4. Confusion  She is having periods of confusion and not making much sense.  Due to this confusion I have called EMS to transport to ER for further evaluation  5. Other specified hypotension EMS called for transport to ER for further evaluation   Patient was given opportunity to ask questions. Patient verbalized understanding of the plan and was able to repeat key elements of the plan. All questions were answered to their satisfaction.  11m, FNP   I, Arnette Felts, FNP, have reviewed all documentation for this visit. The documentation on 05/05/21 for the exam, diagnosis, procedures, and orders are all accurate and complete.   IF YOU HAVE BEEN REFERRED TO A SPECIALIST, IT MAY TAKE 1-2 WEEKS TO SCHEDULE/PROCESS THE REFERRAL. IF YOU HAVE NOT HEARD FROM US/SPECIALIST IN TWO WEEKS, PLEASE GIVE 07/05/21 A CALL AT [phone] X [phone].   THE PATIENT IS ENCOURAGED TO PRACTICE SOCIAL DISTANCING DUE  TO THE COVID-19 PANDEMIC.

## 2021-05-05 NOTE — H&P (Signed)
History and Physical    Sherri [name] MVH:[id number] DOB: 1966/07/18 DOA: 05/05/2021  PCP: Arnette Felts, FNP  Patient coming from: Home.  Chief Complaint: Confusion.  HPI: Sherri [name] is a 55 y.o. female with history of diabetes mellitus type 2 was found to be confused by patient's family was taken to the primary care physician where patient was found to be confused hypotensive and hypoglycemic.  Patient was transferred to ER.  ED Course: In the ER patient blood sugars were in the 30s was given D50 despite which patient blood sugar remained low was started on D10.  Blood pressure was in the 80s was given fluid bolus following blood pressure improved.  Labs are significant for creatinine of 2.05 which increased from 1.1 in February 2022 2 months ago.  Hemoglobin is 11.2 which is at baseline.  EKG shows normal sinus rhythm CT head unremarkable.  Patient admitted for hypoglycemia with altered mental status and acute renal failure with low normal blood pressure.  COVID test is negative.  By the time I examined the patient patient is alert awake oriented time place and person.  Patient states her blood sugars are running high 2 days ago when she took extra dose of Tresiba.  Patient denies any vomiting or diarrhea.  Has been eating well as per the patient.  Review of Systems: As per HPI, rest all negative.   Past Medical History:  Diagnosis Date  . Angioedema   . Anxiety   . Arthritis   . Asthma   . Benzodiazepine withdrawal (HCC) 08/30/2012  . CAP (community acquired pneumonia) 08/29/2012  . Chronic back pain   . Chronic kidney disease   . Closed nondisplaced fracture of proximal phalanx of right little finger 10/18/2018  . Cocaine abuse (HCC)   . Depression   . Diarrhea   . DVT (deep venous thrombosis) (HCC)   . Environmental allergies   . Fall   . GERD (gastroesophageal reflux disease)   . Heart murmur    has been told once that she has a heart murmur, but has never had any  problems  . Hiatal hernia 03/21/2013  . Lumbar herniated disc   . Peripheral vascular disease (HCC)   . Pernicious anemia 10/30/2011  . Postconcussion syndrome 12/30/2014  . Rhabdomyolysis 08/27/2012  . Seasonal allergies   . Stiff person syndrome   . Urticaria     Past Surgical History:  Procedure Laterality Date  . BUNIONECTOMY Left   . colonoscopy  07/09/15  . inguinal hernia 1983  1983  . OPEN REDUCTION INTERNAL FIXATION (ORIF) DISTAL RADIAL FRACTURE Right 08/16/2016   Procedure: OPEN REDUCTION INTERNAL FIXATION (ORIF) DISTAL RADIAL FRACTURE;  Surgeon: Eldred Manges, MD;  Location: [facility] OR;  Service: Orthopedics;  Laterality: Right;     reports that she has never smoked. She has never used smokeless tobacco. She reports previous alcohol use. She reports previous drug use. Drug: Cocaine.  Allergies  Allergen Reactions  . Ibuprofen Other (See Comments)    Does not take due to hx of renal insufficiency "I have kidney disease"   . Lemon Flavor Swelling    Severe Lip Swelling  FRUIT per pt.    . Amoxicillin Diarrhea and Other (See Comments)  . Tylenol [Acetaminophen] Hives    Cannot take large quantities    Family History  Problem Relation Age of Onset  . Cancer Mother   . Other Mother   . COPD Father   . Asthma Brother   .  Cancer Brother        colon  . Heart attack Brother   . Seizures Brother   . Allergic rhinitis Neg Hx   . Angioedema Neg Hx   . Eczema Neg Hx   . Immunodeficiency Neg Hx   . Urticaria Neg Hx     Prior to Admission medications   Medication Sig Start Date End Date Taking? Authorizing Provider  albuterol (VENTOLIN HFA) 108 (90 Base) MCG/ACT inhaler TAKE 2 PUFFS BY MOUTH EVERY 6 HOURS AS NEEDED FOR WHEEZE OR SHORTNESS OF BREATH 03/25/21   Arnette FeltsMoore, Janece, FNP  Ascorbic Acid (VITAMIN C) 1000 MG tablet Take 1,000 mg by mouth daily.    [provider]  baclofen (LIORESAL) 20 MG tablet TAKE 1 AND 1/2 TABLETS BY  MOUTH IN THE MORNING AND IN THE EVENING  AND 1 TABLET AT MIDDAY 12/22/20   Glean SalvoSlack, Sarah J, NP  benzonatate (TESSALON PERLES) 100 MG capsule Take 1 capsule (100 mg total) by mouth every 6 (six) hours as needed. 03/10/21 03/10/22  Arnette FeltsMoore, Janece, FNP  cetirizine (ZYRTEC) 10 MG tablet Take 1 tablet (10 mg total) by mouth daily. 06/17/20   Arnette FeltsMoore, Janece, FNP  Cholecalciferol (VITAMIN D3) 10 MCG (400 UNIT) tablet Take 1 each by mouth daily.     [provider]  Cranberry-Vitamin C-Vitamin E 4200-20-3 MG-MG-UNIT CAPS Take by mouth.     [provider]  cyanocobalamin (,VITAMIN B-12,) 1000 MCG/ML injection Inject 1,000 mcg into the muscle every 3 (three) months.    [provider]  dapagliflozin propanediol (FARXIGA) 5 MG TABS tablet Take 1 tablet (5 mg total) by mouth daily before breakfast. 03/01/21   Arnette FeltsMoore, Janece, FNP  desloratadine (CLARINEX) 5 MG tablet TAKE 1 TABLET BY MOUTH  DAILY 02/02/21   Arnette FeltsMoore, Janece, FNP  diazepam (VALIUM) 10 MG tablet TAKE 2 TABLETS BY MOUTH 3  TIMES DAILY 03/23/21   Glean SalvoSlack, Sarah J, NP  diphenhydrAMINE (BENADRYL) 25 MG tablet Take 25 mg by mouth at bedtime as needed for itching or allergies.    [provider]  EPINEPHRINE 0.3 mg/0.3 mL IJ SOAJ injection INJECT CONTENTS OF 1 PEN AS NEEDED FOR ALLERGIC  RESPONSE AS DIRECTED BY MD. SEEK MEDICAL ATTENTION  AFTER USE. 03/01/21   Arnette FeltsMoore, Janece, FNP  famotidine (PEPCID) 20 MG tablet TAKE 1 TABLET BY MOUTH TWICE A DAY 08/16/20   Arnette FeltsMoore, Janece, FNP  FLUoxetine (PROZAC) 10 MG capsule TAKE 1 CAPSULE BY MOUTH  DAILY 03/01/21   Glean SalvoSlack, Sarah J, NP  gabapentin (NEURONTIN) 300 MG capsule TAKE 1 CAPSULE BY MOUTH TWO TIMES DAILY Patient taking differently: Take 300 mg by mouth 2 (two) times daily. TAKE 1 CAPSULE BY MOUTH TWO TIMES DAILY 06/22/20   Glean SalvoSlack, Sarah J, NP  glipiZIDE (GLUCOTROL) 5 MG tablet TAKE 1 TABLET BY MOUTH EVERY DAY BEFORE MEALS 12/22/20   Arnette FeltsMoore, Janece, FNP  Glucose Blood (ACCU-CHEK SIMPLICITY TEST STRP VI) by In Vitro route. Insert 1 by  subcutaneous route 4 times every day check blood sugar before breakfast, lunch and dinner and bedtime    [provider]  hydrOXYzine (VISTARIL) 100 MG capsule TAKE 1 CAPSULE BY MOUTH 3  TIMES DAILY AS NEEDED FOR  ITCHING 03/01/21   Arnette FeltsMoore, Janece, FNP  insulin degludec (TRESIBA FLEXTOUCH) 100 UNIT/ML FlexTouch Pen Inject 15 Units into the skin daily. 04/19/21   Charlesetta IvoryGhumman, Ramandeep, NP  Insulin Pen Needle (EASY TOUCH PEN NEEDLES) 31G X 8 MM MISC Use as directed with Evaristo Buryresiba E11.9 04/20/21  Arnette Felts, FNP  metFORMIN (GLUCOPHAGE) 500 MG tablet TAKE 2 TABLETS (1,000 MG TOTAL) BY MOUTH 2 (TWO) TIMES DAILY WITH A MEAL. 01/24/21   Arnette Felts, FNP  Olopatadine HCl 0.7 % SOLN Apply 1 drop to eye as needed.     [provider]  vitamin E 180 MG (400 UNITS) capsule Take 400 Units by mouth daily.     [provider]    Physical Exam: Constitutional: Moderately built and nourished. Vitals:   05/05/21 1930 05/05/21 1945 05/05/21 2000 05/05/21 2030  BP: 91/62 94/72 100/89 106/72  Pulse: 71 71 71 82  Resp: 18 13 14 16   Temp:      TempSrc:      SpO2: 100% 100% 100% 99%   Eyes: Anicteric no pallor. ENMT: No discharge from the ears eyes nose and mouth. Neck: No mass felt.  No neck rigidity. Respiratory: No rhonchi or crepitations. Cardiovascular: S1-S2 heard. Abdomen: Soft nontender bowel sounds present. Musculoskeletal: No edema. Skin: No rash. Neurologic: Alert awake oriented to time place and person.  Moves all extremities. Psychiatric: Appears normal.  Normal affect.   Labs on Admission: I have personally reviewed following labs and imaging studies  CBC: Recent Labs  Lab 05/05/21 1844  WBC 10.8*  NEUTROABS 8.2*  HGB 11.2*  HCT 34.8*  MCV 89.9  PLT 285   Basic Metabolic Panel: Recent Labs  Lab 05/05/21 1844  NA 134*  K 3.9  CL 100  CO2 24  GLUCOSE 168*  BUN 31*  CREATININE 2.05*  CALCIUM 9.4   GFR: Estimated Creatinine Clearance: 18.4 mL/min (A)  (by C-G formula based on SCr of 2.05 mg/dL (H)). Liver Function Tests: Recent Labs  Lab 05/05/21 1844  AST 24  ALT 81*  ALKPHOS 85  BILITOT 0.4  PROT 6.2*  ALBUMIN 3.3*   No results for input(s): LIPASE, AMYLASE in the last 168 hours. Recent Labs  Lab 05/05/21 1833  AMMONIA 12   Coagulation Profile: No results for input(s): INR, PROTIME in the last 168 hours. Cardiac Enzymes: No results for input(s): CKTOTAL, CKMB, CKMBINDEX, TROPONINI in the last 168 hours. BNP (last 3 results) No results for input(s): PROBNP in the last 8760 hours. HbA1C: No results for input(s): HGBA1C in the last 72 hours. CBG: Recent Labs  Lab 05/05/21 1733 05/05/21 1747 05/05/21 1839 05/05/21 2013  GLUCAP 34* 169* 133* 61*   Lipid Profile: No results for input(s): CHOL, HDL, LDLCALC, TRIG, CHOLHDL, LDLDIRECT in the last 72 hours. Thyroid Function Tests: No results for input(s): TSH, T4TOTAL, FREET4, T3FREE, THYROIDAB in the last 72 hours. Anemia Panel: No results for input(s): VITAMINB12, FOLATE, FERRITIN, TIBC, IRON, RETICCTPCT in the last 72 hours. Urine analysis:    Component Value Date/Time   COLORURINE YELLOW 11/23/2017 1433   APPEARANCEUR CLEAR 11/23/2017 1433   LABSPEC >=1.030 09/28/2018 1246   PHURINE 5.5 09/28/2018 1246   GLUCOSEU 250 (A) 09/28/2018 1246   HGBUR LARGE (A) 09/28/2018 1246   BILIRUBINUR negative 03/22/2021 1137   KETONESUR TRACE (A) 09/28/2018 1246   PROTEINUR Negative 03/22/2021 1137   PROTEINUR 100 (A) 09/28/2018 1246   UROBILINOGEN 0.2 03/22/2021 1137   UROBILINOGEN 1.0 09/28/2018 1246   NITRITE negative 03/22/2021 1137   NITRITE NEGATIVE 09/28/2018 1246   LEUKOCYTESUR Negative 03/22/2021 1137   Sepsis Labs: @LABRCNTIP (procalcitonin:4,lacticidven:4) ) Recent Results (from the past 240 hour(s))  Resp Panel by RT-PCR (Flu A&B, Covid) Nasopharyngeal Swab     Status: None   Collection Time: 05/05/21  6:30  PM   Specimen: Nasopharyngeal Swab;  Nasopharyngeal(NP) swabs in vial transport medium  Result Value Ref Range Status   SARS Coronavirus 2 by RT PCR NEGATIVE NEGATIVE Final    Comment: (NOTE) SARS-CoV-2 target nucleic acids are NOT DETECTED.  The SARS-CoV-2 RNA is generally detectable in upper respiratory specimens during the acute phase of infection. The lowest concentration of SARS-CoV-2 viral copies this assay can detect is 138 copies/mL. A negative result does not preclude SARS-Cov-2 infection and should not be used as the sole basis for treatment or other patient management decisions. A negative result may occur with  improper specimen collection/handling, submission of specimen other than nasopharyngeal swab, presence of viral mutation(s) within the areas targeted by this assay, and inadequate number of viral copies(<138 copies/mL). A negative result must be combined with clinical observations, patient history, and epidemiological information. The expected result is Negative.  Fact Sheet for Patients:  BloggerCourse.com  Fact Sheet for Healthcare Providers:  SeriousBroker.it  This test is no t yet approved or cleared by the [place] FDA and  has been authorized for detection and/or diagnosis of SARS-CoV-2 by FDA under an Emergency Use Authorization (EUA). This EUA will remain  in effect (meaning this test can be used) for the duration of the COVID-19 declaration under Section 564(b)(1) of the Act, 21 U.S.C.section 360bbb-3(b)(1), unless the authorization is terminated  or revoked sooner.       Influenza A by PCR NEGATIVE NEGATIVE Final   Influenza B by PCR NEGATIVE NEGATIVE Final    Comment: (NOTE) The Xpert Xpress SARS-CoV-2/FLU/RSV plus assay is intended as an aid in the diagnosis of influenza from Nasopharyngeal swab specimens and should not be used as a sole basis for treatment. Nasal washings and aspirates are unacceptable for Xpert Xpress  SARS-CoV-2/FLU/RSV testing.  Fact Sheet for Patients: BloggerCourse.com  Fact Sheet for Healthcare Providers: SeriousBroker.it  This test is not yet approved or cleared by the [place] FDA and has been authorized for detection and/or diagnosis of SARS-CoV-2 by FDA under an Emergency Use Authorization (EUA). This EUA will remain in effect (meaning this test can be used) for the duration of the COVID-19 declaration under Section 564(b)(1) of the Act, 21 U.S.C. section 360bbb-3(b)(1), unless the authorization is terminated or revoked.  Performed at [facility], [address], [place], [place] [address]      Radiological Exams on Admission: CT Head Wo Contrast  Result Date: 05/05/2021 CLINICAL DATA:  Delirium EXAM: CT HEAD WITHOUT CONTRAST TECHNIQUE: Contiguous axial images were obtained from the base of the skull through the vertex without intravenous contrast. COMPARISON:  MRI 01/02/2017, CT brain 07/06/2016 FINDINGS: Brain: No evidence of acute infarction, hemorrhage, hydrocephalus, extra-axial collection or mass lesion/mass effect. Vascular: No hyperdense vessel or unexpected calcification. Skull: Normal. Negative for fracture or focal lesion. Sinuses/Orbits: No acute finding. Other: None IMPRESSION: Negative non contrasted CT appearance of the brain Electronically Signed   By: Jasmine Pang M.D.   On: 05/05/2021 18:30   DG Chest Portable 1 View  Result Date: 05/05/2021 CLINICAL DATA:  Low blood pressure. EXAM: PORTABLE CHEST 1 VIEW COMPARISON:  November 12, 2020 FINDINGS: The heart size and mediastinal contours are within normal limits. Both lungs are clear. The visualized skeletal structures are unremarkable. IMPRESSION: No active disease. Electronically Signed   By: Gerome Sam III M.D   On: 05/05/2021 19:00    EKG: Independently reviewed.  Normal sinus rhythm.  Assessment/Plan Principal Problem:    Hypoglycemia Active Problems:  Mild intermittent asthma   Anemia   Diabetes type 2, controlled (HCC)   AKI (acute kidney injury) (HCC)    1. Acute encephalopathy likely from hypoglycemia which is gradually improving with D10.  Hypoglycemia likely precipitated by taking antidiabetic medication in the setting of acute renal failure.  Will hold all antidiabetic medications including glipizide Tresiba.  Check hemoglobin A1c and cortisol levels.  Continue with D10 CBGs check every hour for now.  Patient does take other medication including baclofen which could also contribute to patient's confusion in the setting of renal failure. 2. Acute renal failure cause not clear.  UA is pending.  Continue with hydration follow metabolic panel. 3. Hypotension likely from dehydration.  Hydrate and closely follow blood pressure trends.  Blood pressure is improving with fluids. 4. Anemia follow CBC.   DVT prophylaxis: Lovenox. Code Status: Full code. Family Communication: Will need to discuss with family. Disposition Plan: Home. Consults called: None. Admission status: Observation.   Eduard Clos MD [facility] Pager [phone].  If 7PM-7AM, please contact night-coverage [email or web address] Password [facility]  05/05/2021, 8:43 PM

## 2021-05-05 NOTE — ED Provider Notes (Signed)
Emergency Department Provider Note   I have reviewed the triage vital signs and the nursing notes.   HISTORY  Chief Complaint Altered Mental Status   HPI Sherri [name] is a 55 y.o. female with past medical history reviewed below including insulin-dependent diabetes presents to the emergency department with generalized weakness and low blood pressures with hypoglycemia discovered on arrival to the emergency department.  Patient arrives by EMS from her PCP office.  Family described frequent falls and somnolence.  Patient was found to be hypotensive in the PCP office.  She denies any pain.  She specifically denies any chest pain, shortness of breath, abdominal pain, vomiting, diarrhea.  No respiratory symptoms.  She just feels fatigued and tired.  She states she has been taking her insulin appropriately. Denies any intention or unintentional OD.   Level 5 caveat: Somnolence.   Past Medical History:  Diagnosis Date  . Angioedema   . Anxiety   . Arthritis   . Asthma   . Benzodiazepine withdrawal (HCC) 08/30/2012  . CAP (community acquired pneumonia) 08/29/2012  . Chronic back pain   . Chronic kidney disease   . Closed nondisplaced fracture of proximal phalanx of right little finger 10/18/2018  . Cocaine abuse (HCC)   . Depression   . Diarrhea   . DVT (deep venous thrombosis) (HCC)   . Environmental allergies   . Fall   . GERD (gastroesophageal reflux disease)   . Heart murmur    has been told once that she has a heart murmur, but has never had any problems  . Hiatal hernia 03/21/2013  . Lumbar herniated disc   . Peripheral vascular disease (HCC)   . Pernicious anemia 10/30/2011  . Postconcussion syndrome 12/30/2014  . Rhabdomyolysis 08/27/2012  . Seasonal allergies   . Stiff person syndrome   . Urticaria     Patient Active Problem List   Diagnosis Date Noted  . Hypoglycemia 05/05/2021  . AKI (acute kidney injury) (HCC) 05/05/2021  . Anxiety   . Chronic rhinitis 04/24/2019   . Keloid 10/10/2018  . Diabetes type 2, controlled (HCC) 10/10/2018  . Depression 01/02/2017  . History of DVT (deep vein thrombosis) 01/02/2017  . Chronic pain syndrome 01/02/2017  . Cocaine abuse (HCC) 01/02/2017  . Dysphagia   . Adjustment insomnia   . Distal radius fracture, right 08/16/2016  . Postconcussion syndrome 12/30/2014  . Tremor 03/11/2014  . 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 Hiatal hernia 03/21/2013  . Anemia, iron deficiency 01/29/2013  . Spasm of muscle 09/02/2012  . Anemia 08/28/2012  . Mild intermittent asthma 08/27/2012  . Chronic back pain   . Pernicious anemia 10/30/2011  . DEGENERATIVE DISC DISEASE 08/18/2010  . Asthma with bronchitis 07/28/2010  . Recurrent urticaria 07/28/2010    Past Surgical History:  Procedure Laterality Date  . BUNIONECTOMY Left   . colonoscopy  07/09/15  . inguinal hernia 1983  1983  . OPEN REDUCTION INTERNAL FIXATION (ORIF) DISTAL RADIAL FRACTURE Right 08/16/2016   Procedure: OPEN REDUCTION INTERNAL FIXATION (ORIF) DISTAL RADIAL FRACTURE;  Surgeon: Eldred Manges, MD;  Location: [facility] OR;  Service: Orthopedics;  Laterality: Right;    Allergies Ibuprofen, Lemon flavor, Amoxicillin, and Tylenol [acetaminophen]  Family History  Problem Relation Age of Onset  . Cancer Mother   . Other Mother   . COPD Father   . Asthma Brother   . Cancer Brother        colon  . Heart attack Brother   . Seizures  Brother   . Allergic rhinitis Neg Hx   . Angioedema Neg Hx   . Eczema Neg Hx   . Immunodeficiency Neg Hx   . Urticaria Neg Hx     Social History Social History   Tobacco Use  . Smoking status: Never Smoker  . Smokeless tobacco: Never Used  . Tobacco comment: Never Used Tobacco  Vaping Use  . Vaping Use: Never used  Substance Use Topics  . Alcohol use: Not Currently    Alcohol/week: 0.0 standard drinks    Comment: Occasional  . Drug use: Not Currently    Types: Cocaine    Review of Systems  Constitutional: No  fever/chills. Positive generalized weakness.  Eyes: No visual changes. ENT: No sore throat. Cardiovascular: Denies chest pain. Respiratory: Denies shortness of breath. Gastrointestinal: No abdominal pain.  No nausea, no vomiting.  No diarrhea.  No constipation. Genitourinary: Negative for dysuria. Musculoskeletal: Negative for back pain. Skin: Negative for rash. Neurological: Negative for headaches, focal weakness or numbness.  10-point ROS otherwise negative.  ____________________________________________   PHYSICAL EXAM:  VITAL SIGNS: ED Triage Vitals  Enc Vitals Group     BP 05/05/21 1732 (!) 93/51     Pulse Rate 05/05/21 1732 94     Resp 05/05/21 1732 (!) 26     Temp 05/05/21 1732 98.6 F (37 C)     Temp Source 05/05/21 1732 Oral     SpO2 05/05/21 1732 100 %   Constitutional: Drowsy but awakens to voice. No acute distress but difficult to hold her attention.  Eyes: Conjunctivae are normal.  Head: Atraumatic. Nose: No congestion/rhinnorhea. Mouth/Throat: Mucous membranes are moist.  Neck: No stridor. Cardiovascular: Normal rate, regular rhythm. Good peripheral circulation. Grossly normal heart sounds.   Respiratory: Normal respiratory effort.  No retractions. Lungs CTAB. Gastrointestinal: Soft and nontender. No distention.  Musculoskeletal: No lower extremity tenderness nor edema. No gross deformities of extremities. Neurologic:  Normal speech and language. Globally diminished strength bilaterally but no unilateral weakness or numbness.  No facial droop.  Skin:  Skin is warm, dry and intact. No rash noted.  ____________________________________________   LABS (all labs ordered are listed, but only abnormal results are displayed)  Labs Reviewed  COMPREHENSIVE METABOLIC PANEL - Abnormal; Notable for the following components:      Result Value   Sodium 134 (*)    Glucose, Bld 168 (*)    BUN 31 (*)    Creatinine, Ser 2.05 (*)    Total Protein 6.2 (*)    Albumin 3.3  (*)    ALT 81 (*)    GFR, Estimated 28 (*)    All other components within normal limits  CBC WITH DIFFERENTIAL/PLATELET - Abnormal; Notable for the following components:   WBC 10.8 (*)    Hemoglobin 11.2 (*)    HCT 34.8 (*)    Neutro Abs 8.2 (*)    All other components within normal limits  CBC - Abnormal; Notable for the following components:   Hemoglobin 11.2 (*)    HCT 34.2 (*)    All other components within normal limits  CREATININE, SERUM - Abnormal; Notable for the following components:   Creatinine, Ser 1.89 (*)    GFR, Estimated 31 (*)    All other components within normal limits  GLUCOSE, CAPILLARY - Abnormal; Notable for the following components:   Glucose-Capillary 157 (*)    All other components within normal limits  GLUCOSE, CAPILLARY - Abnormal; Notable for the following components:  Glucose-Capillary 211 (*)    All other components within normal limits  CBG MONITORING, ED - Abnormal; Notable for the following components:   Glucose-Capillary 34 (*)    All other components within normal limits  CBG MONITORING, ED - Abnormal; Notable for the following components:   Glucose-Capillary 169 (*)    All other components within normal limits  CBG MONITORING, ED - Abnormal; Notable for the following components:   Glucose-Capillary 133 (*)    All other components within normal limits  CBG MONITORING, ED - Abnormal; Notable for the following components:   Glucose-Capillary 61 (*)    All other components within normal limits  CBG MONITORING, ED - Abnormal; Notable for the following components:   Glucose-Capillary 48 (*)    All other components within normal limits  CBG MONITORING, ED - Abnormal; Notable for the following components:   Glucose-Capillary 187 (*)    All other components within normal limits  RESP PANEL BY RT-PCR (FLU A&B, COVID) ARPGX2  MRSA PCR SCREENING  ETHANOL  AMMONIA  URINALYSIS, ROUTINE W REFLEX MICROSCOPIC  RAPID URINE DRUG SCREEN, HOSP PERFORMED   CBC  COMPREHENSIVE METABOLIC PANEL  CBG MONITORING, ED  CBG MONITORING, ED  CBG MONITORING, ED  CBG MONITORING, ED  CBG MONITORING, ED  CBG MONITORING, ED   ____________________________________________  EKG   EKG Interpretation  Date/Time:  Thursday May 05 2021 18:37:15 EDT Ventricular Rate:  83 PR Interval:  138 QRS Duration: 70 QT Interval:  392 QTC Calculation: 461 R Axis:   114 Text Interpretation: Sinus rhythm Probable right ventricular hypertrophy Similar to prior. Confirmed by Alona Bene [id number]) on 05/05/2021 7:18:33 PM       ____________________________________________  RADIOLOGY  CT Head Wo Contrast  Result Date: 05/05/2021 CLINICAL DATA:  Delirium EXAM: CT HEAD WITHOUT CONTRAST TECHNIQUE: Contiguous axial images were obtained from the base of the skull through the vertex without intravenous contrast. COMPARISON:  MRI 01/02/2017, CT brain 07/06/2016 FINDINGS: Brain: No evidence of acute infarction, hemorrhage, hydrocephalus, extra-axial collection or mass lesion/mass effect. Vascular: No hyperdense vessel or unexpected calcification. Skull: Normal. Negative for fracture or focal lesion. Sinuses/Orbits: No acute finding. Other: None IMPRESSION: Negative non contrasted CT appearance of the brain Electronically Signed   By: Jasmine Pang M.D.   On: 05/05/2021 18:30   DG Chest Portable 1 View  Result Date: 05/05/2021 CLINICAL DATA:  Low blood pressure. EXAM: PORTABLE CHEST 1 VIEW COMPARISON:  November 12, 2020 FINDINGS: The heart size and mediastinal contours are within normal limits. Both lungs are clear. The visualized skeletal structures are unremarkable. IMPRESSION: No active disease. Electronically Signed   By: Gerome Sam III M.D   On: 05/05/2021 19:00   DG Abd Portable 1V  Result Date: 05/05/2021 CLINICAL DATA:  Foreign body. EXAM: PORTABLE ABDOMEN - 1 VIEW COMPARISON:  None. FINDINGS: Normal bowel gas pattern. No evidence of obstruction. Moderate stool in  the rectosigmoid colon. Suspected hiatal hernia. No radiopaque foreign body or implanted medical device. Mild scoliotic curvature of the spine. No acute osseous abnormalities are seen. IMPRESSION: 1. No radiopaque foreign body or implanted medical device. 2. Normal bowel gas pattern.  Suspected hiatal hernia. Electronically Signed   By: Narda Rutherford M.D.   On: 05/05/2021 22:52    ____________________________________________   PROCEDURES  Procedure(s) performed:   Procedures  CRITICAL CARE Performed by: Maia Plan Total critical care time: 35 minutes Critical care time was exclusive of separately billable procedures and treating other patients. Critical care  was necessary to treat or prevent imminent or life-threatening deterioration. Critical care was time spent personally by me on the following activities: development of treatment plan with patient and/or surrogate as well as nursing, discussions with consultants, evaluation of patient's response to treatment, examination of patient, obtaining history from patient or surrogate, ordering and performing treatments and interventions, ordering and review of laboratory studies, ordering and review of radiographic studies, pulse oximetry and re-evaluation of patient's condition.  Alona BeneJoshua Elanna Bert, MD Emergency Medicine  ____________________________________________   INITIAL IMPRESSION / ASSESSMENT AND PLAN / ED COURSE  Pertinent labs & imaging results that were available during my care of the patient were reviewed by me and considered in my medical decision making (see chart for details).   Patient arrives to the emergency department with altered mental status.  She is found at the PCPs office to be hypertensive and hyperglycemic here.  Not complaining of pain but exam is somewhat limited by the patient's somnolence.  She will wake up and participate with the exam and answer some questions briefly but requires frequent prompting.  Plan to  continue every hour blood sugar checks and will send labs and follow CT imaging of the head along with chest x-ray and COVID swab. She is afebrile.   Patient's lab work showing some acute kidney injury.  She continues to be hypoglycemic intermittently.  No clear source of infection to suspect sepsis.  CT imaging of the head reviewed.  Ammonia negative.  COVID and flu are negative.  CVA considered although nothing focal.  [facility], AKI, and hypoglycemia.   Discussed patient's case with [facility] to request admission. Patient and family (if present) updated with plan. [facility].  I reviewed all nursing notes, vitals, pertinent old records, EKGs, labs, imaging (as available).  ____________________________________________  FINAL CLINICAL IMPRESSION(S) / ED DIAGNOSES  Final diagnoses:  AKI (acute kidney injury) (HCC)  Somnolence     MEDICATIONS GIVEN DURING THIS VISIT:  Medications  dextrose 10 % infusion ( Intravenous Restarted 05/05/21 2354)  enoxaparin (LOVENOX) injection 40 mg (40 mg Subcutaneous Given 05/05/21 2102)  feeding supplement (ENSURE ENLIVE / ENSURE PLUS) liquid 237 mL (has no administration in time range)  dextrose 50 % solution 50 mL (50 mLs Intravenous Given 05/05/21 1744)  sodium chloride 0.9 % bolus 1,000 mL (0 mLs Intravenous Stopped 05/05/21 2014)  0.9 %  sodium chloride infusion ( Intravenous Stopped 05/05/21 2034)  dextrose 50 % solution 50 mL (50 mLs Intravenous Given 05/05/21 2059)    Note:  This document was prepared using Dragon voice recognition software and may include unintentional dictation errors.  Alona BeneJoshua Jurni Cesaro, MD, [facility] Emergency Medicine    Dayra Rapley, Arlyss RepressJoshua G, MD 05/06/21 Marlyne [email or web address]

## 2021-05-05 NOTE — ED Triage Notes (Signed)
Pt bib [facility] from MD office. Pt went to MD office because family felt that patient was not acting right and had been falling frequently.  Pt's BP=90/50, FSBS=74 at MD office. Pt's BP=88/50 in route, was given NS.  Pt is lethargic, slow to answer questions on arrival. Pt's FSBS=34 on arrival to triage. Pt given D50. Pt able to answer questions more quickly after D50. Pt states she feels tired and hungry.

## 2021-05-06 ENCOUNTER — Other Ambulatory Visit (HOSPITAL_COMMUNITY): Payer: Self-pay

## 2021-05-06 DIAGNOSIS — E11649 Type 2 diabetes mellitus with hypoglycemia without coma: Secondary | ICD-10-CM | POA: Diagnosis not present

## 2021-05-06 DIAGNOSIS — E162 Hypoglycemia, unspecified: Secondary | ICD-10-CM | POA: Diagnosis not present

## 2021-05-06 LAB — COMPREHENSIVE METABOLIC PANEL
ALT: 72 U/L — ABNORMAL HIGH (ref 0–44)
AST: 27 U/L (ref 15–41)
Albumin: 3.1 g/dL — ABNORMAL LOW (ref 3.5–5.0)
Alkaline Phosphatase: 80 U/L (ref 38–126)
Anion gap: 7 (ref 5–15)
BUN: 29 mg/dL — ABNORMAL HIGH (ref 6–20)
CO2: 22 mmol/L (ref 22–32)
Calcium: 9.1 mg/dL (ref 8.9–10.3)
Chloride: 107 mmol/L (ref 98–111)
Creatinine, Ser: 1.75 mg/dL — ABNORMAL HIGH (ref 0.44–1.00)
GFR, Estimated: 34 mL/min — ABNORMAL LOW (ref >60)
Glucose, Bld: 209 mg/dL — ABNORMAL HIGH (ref 70–99)
Potassium: 4.3 mmol/L (ref 3.5–5.1)
Sodium: 136 mmol/L (ref 135–145)
Total Bilirubin: 0.7 mg/dL (ref 0.3–1.2)
Total Protein: 5.9 g/dL — ABNORMAL LOW (ref 6.5–8.1)

## 2021-05-06 LAB — CBC
HCT: 32.6 % — ABNORMAL LOW (ref 36.0–46.0)
Hemoglobin: 10.4 g/dL — ABNORMAL LOW (ref 12.0–15.0)
MCH: 28.5 pg (ref 26.0–34.0)
MCHC: 31.9 g/dL (ref 30.0–36.0)
MCV: 89.3 fL (ref 80.0–100.0)
Platelets: 240 10*3/uL (ref 150–400)
RBC: 3.65 MIL/uL — ABNORMAL LOW (ref 3.87–5.11)
RDW: 11.8 % (ref 11.5–15.5)
WBC: 8.9 10*3/uL (ref 4.0–10.5)
nRBC: 0 % (ref 0.0–0.2)

## 2021-05-06 LAB — URINALYSIS, ROUTINE W REFLEX MICROSCOPIC
Bilirubin Urine: NEGATIVE
Glucose, UA: >=500 mg/dL — AB
Hgb urine dipstick: NEGATIVE
Ketones, ur: NEGATIVE mg/dL
Nitrite: NEGATIVE
Protein, ur: NEGATIVE mg/dL
Specific Gravity, Urine: 1.012 (ref 1.005–1.030)
pH: 5 (ref 5.0–8.0)

## 2021-05-06 LAB — RAPID URINE DRUG SCREEN, HOSP PERFORMED
Amphetamines: NOT DETECTED
Barbiturates: NOT DETECTED
Benzodiazepines: POSITIVE — AB
Cocaine: NOT DETECTED
Opiates: NOT DETECTED
Tetrahydrocannabinol: NOT DETECTED

## 2021-05-06 LAB — HEMOGLOBIN A1C
Hgb A1c MFr Bld: 13 % — ABNORMAL HIGH (ref 4.8–5.6)
Mean Plasma Glucose: 326.4 mg/dL

## 2021-05-06 LAB — MRSA PCR SCREENING: MRSA by PCR: NEGATIVE

## 2021-05-06 LAB — GLUCOSE, CAPILLARY
Glucose-Capillary: 192 mg/dL — ABNORMAL HIGH (ref 70–99)
Glucose-Capillary: 138 mg/dL — ABNORMAL HIGH (ref 70–99)
Glucose-Capillary: 256 mg/dL — ABNORMAL HIGH (ref 70–99)
Glucose-Capillary: 328 mg/dL — ABNORMAL HIGH (ref 70–99)
Glucose-Capillary: 285 mg/dL — ABNORMAL HIGH (ref 70–99)

## 2021-05-06 LAB — CORTISOL: Cortisol, Plasma: 7 ug/dL

## 2021-05-06 MED ORDER — INSULIN ASPART 100 UNIT/ML IJ SOLN
10.0000 [IU] | Freq: Once | INTRAMUSCULAR | Status: AC
  Administered 2021-05-06: 10 [IU] via SUBCUTANEOUS

## 2021-05-06 MED ORDER — DIAZEPAM 10 MG PO TABS: ORAL_TABLET | ORAL | 4 refills | Status: DC

## 2021-05-06 MED ORDER — LACTATED RINGERS IV SOLN: INTRAVENOUS | Status: AC

## 2021-05-06 MED ORDER — GLUCOSE BLOOD VI STRP
ORAL_STRIP | 12 refills | Status: DC
  Filled 2021-05-06: qty 100, 20d supply, fill #0

## 2021-05-06 NOTE — Progress Notes (Signed)
Patient medications delivered by [facility] pharmacy.  Discharge instructions reviewed and patient verbalzied understanding.  Written copy if instructions given to patient.  Via wheelchair to waiting car in satble condition

## 2021-05-06 NOTE — Progress Notes (Signed)
2 IV start attempts unsuccessful  Will request team

## 2021-05-06 NOTE — Progress Notes (Signed)
IV infiltrated upon receipt of patient. Will allow her to eat and bathroom and then restart

## 2021-05-06 NOTE — Progress Notes (Signed)
PT Cancellation Note  Patient Details Name: Sherri [name] MRN: [id number] DOB: [birth year]   Cancelled Treatment:    Reason Eval/Treat Not Completed: PT screened, no needs identified, will sign off (order received but pt and RN reports she is moving independently in room with no needs and D/C orders written. Will sign off per pt request)   Samir Ishaq B Sherri [name] 05/06/2021, 11:38 AM  Merryl Hacker, PT Acute Rehabilitation Services Pager: [phone] Office: [phone]

## 2021-05-06 NOTE — Plan of Care (Signed)

## 2021-05-06 NOTE — TOC Transition Note (Signed)
Transition of Care [facility]) - CM/SW Discharge Note   Patient Details  Name: Sherri [name] MRN: [id number] Date of Birth: January 16, 1966  [facility] CM/SW Contact:  Leone Haven, RN Phone Number: 05/06/2021, 1:18 PM   Clinical Narrative:    Pateint is for dc home, she states she needs accucheck strips for her glucometer and would like for [place] to fill them here at [facility] pharmacy for her.  NCM notified MD of this information, awaiting script for strips.   Final next level of care: Home/Self Care Barriers to Discharge: No Barriers Identified   Patient Goals and CMS Choice Patient states their goals for this hospitalization and ongoing recovery are:: return home   Choice offered to / list presented to : NA  Discharge Placement                       Discharge Plan and Services                  DME Agency: NA       HH Arranged: NA          Social Determinants of Health (SDOH) Interventions     Readmission Risk Interventions No flowsheet data found.

## 2021-05-06 NOTE — Discharge Summary (Signed)
Physician Discharge Summary  Krystiana Ridener ZOX:[id number] DOB: December 24, 1966 DOA: 05/05/2021  PCPKaydince Towleslts, FNP  Admit date: 05/05/2021 Discharge date: 05/06/2021  Admitted From: Home Disposition: Home  Recommendations for Outpatient Follow-up:  1. Follow up with PCP in 1-2 weeks 2. Please obtain BMP/CBC in one week 3. Please follow up on the following pending results: None  Home Health: No Equipment/Devices: None Discharge Condition: Stable CODE STATUS: Full Diet recommendation: Heart Healthy / Carb Modified   Brief/Interim Summary: Shirle Provencal is a 55 y.o. female with history of diabetes mellitus type 2 was found to be confused by patient's family was taken to the primary care physician where patient was found to be confused hypotensive and hypoglycemic.  Patient was transferred to ER.  Patient was found to be hypotensive with systolic in 80s which responded to IV fluid.  Blood sugar was in 30s requiring D50 followed by D10 infusion.  Blood glucose level improved and become elevated next morning requiring some insulin.  Apparently patient took an extra dose of Tresiba.  Per patient she was mixing up her doses and did not remember whether she took it in the morning so she took it again when saw elevated blood glucose levels.  Patient is on multiple medications which can increase confusion including high-dose Valium and hydroxyzine.  Discussed with patient and she should work very closely with her PCP to slowly decreased her use of benzos.  Patient was also found to have AKI which started improving with IV fluid.  Most likely secondary to dehydration.  Patient had uncontrolled diabetes with A1c of 13 and need very close follow-up with PCP for better control of diabetes and to help with her polypharmacy.  We continued her home medications and she will follow-up with her PCP for further recommendations.  Discharge Diagnoses:  Principal Problem:   Hypoglycemia Active Problems:    Mild intermittent asthma   Anemia   Diabetes type 2, controlled (HCC)   AKI (acute kidney injury) [facility])   Discharge Instructions  Discharge Instructions    Diet - low sodium heart healthy   Complete by: As directed    Discharge instructions   Complete by: As directed    It was pleasure taking care of you. You are taking very high doses of Valium and hydroxyzine which can affect your ability to function and your memory, please discuss with your primary care provider and slowly come off from those medications.  Do not stop Valium abruptly if you are taking it that high doses and for a long time. You are also on multiple medications for your diabetes, please discuss with your primary care provider and see what can be stopped. Work on Sales executive and keep yourself well-hydrated. Follow-up with your primary care doctor very closely.   Increase activity slowly   Complete by: As directed      Allergies as of 05/06/2021      Reactions   Ibuprofen Other (See Comments)   Does not take due to hx of renal insufficiency "I have kidney disease"    Lemon Flavor Swelling   Severe Lip Swelling  FRUIT per pt.     Amoxicillin Diarrhea, Other (See Comments)   Tylenol [acetaminophen] Hives   Cannot take large quantities      Medication List    STOP taking these medications   benzonatate 100 MG capsule Commonly known as: Tessalon Perles   cetirizine 10 MG tablet Commonly known as: ZYRTEC     TAKE these medications  ACCU-CHEK SIMPLICITY TEST STRP VI by In Vitro route. Insert 1 by subcutaneous route 4 times every day check blood sugar before breakfast, lunch and dinner and bedtime What changed: Another medication with the same name was added. Make sure you understand how and when to take each.   glucose blood test strip Use as instructed What changed: You were already taking a medication with the same name, and this prescription was added. Make sure you understand how and when to take  each.   albuterol 108 (90 Base) MCG/ACT inhaler Commonly known as: VENTOLIN HFA TAKE 2 PUFFS BY MOUTH EVERY 6 HOURS AS NEEDED FOR WHEEZE OR SHORTNESS OF BREATH What changed: See the new instructions.   baclofen 20 MG tablet Commonly known as: LIORESAL TAKE 1 AND 1/2 TABLETS BY  MOUTH IN THE MORNING AND IN THE EVENING AND 1 TABLET AT MIDDAY What changed:   how much to take  how to take this  when to take this  additional instructions   Cranberry-Vitamin C-Vitamin E 4200-20-3 MG-MG-UNIT Caps Take 1 tablet by mouth daily.   cyanocobalamin 1000 MCG/ML injection Commonly known as: (VITAMIN B-12) Inject 1,000 mcg into the muscle every 3 (three) months.   dapagliflozin propanediol 5 MG Tabs tablet Commonly known as: Farxiga Take 1 tablet (5 mg total) by mouth daily before breakfast.   desloratadine 5 MG tablet Commonly known as: CLARINEX TAKE 1 TABLET BY MOUTH  DAILY   diazepam 10 MG tablet Commonly known as: VALIUM TAKE 2 TABLETS BY MOUTH 3  TIMES DAILY as needed What changed: additional instructions   Easy Touch Pen Needles 31G X 8 MM Misc Generic drug: Insulin Pen Needle Use as directed with Evaristo Bury E11.9   EPINEPHrine 0.3 mg/0.3 mL Soaj injection Commonly known as: EPI-PEN INJECT CONTENTS OF 1 PEN AS NEEDED FOR ALLERGIC  RESPONSE AS DIRECTED BY MD. SEEK MEDICAL ATTENTION  AFTER USE. What changed: See the new instructions.   famotidine 20 MG tablet Commonly known as: PEPCID TAKE 1 TABLET BY MOUTH TWICE A DAY   FLUoxetine 10 MG capsule Commonly known as: PROZAC TAKE 1 CAPSULE BY MOUTH  DAILY What changed: when to take this   gabapentin 300 MG capsule Commonly known as: NEURONTIN TAKE 1 CAPSULE BY MOUTH TWO TIMES DAILY What changed:   how much to take  how to take this  when to take this   glipiZIDE 5 MG tablet Commonly known as: GLUCOTROL TAKE 1 TABLET BY MOUTH EVERY DAY BEFORE MEALS What changed:   how much to take  how to take this  when to  take this  additional instructions   hydrOXYzine 100 MG capsule Commonly known as: VISTARIL TAKE 1 CAPSULE BY MOUTH 3  TIMES DAILY AS NEEDED FOR  ITCHING What changed:   how much to take  how to take this  when to take this  additional instructions   metFORMIN 500 MG tablet Commonly known as: GLUCOPHAGE TAKE 2 TABLETS (1,000 MG TOTAL) BY MOUTH 2 (TWO) TIMES DAILY WITH A MEAL.   Olopatadine HCl 0.7 % Soln Place 1 drop into both eyes daily.   Evaristo Bury FlexTouch 100 UNIT/ML FlexTouch Pen Generic drug: insulin degludec Inject 15 Units into the skin daily.   vitamin C 1000 MG tablet Take 1,000 mg by mouth daily.   Vitamin D3 10 MCG (400 UNIT) tablet Take 400 Units by mouth daily.   vitamin E 180 MG (400 UNITS) capsule Take 400 Units by mouth daily.  Follow-up Information    Arnette Felts, FNP. Schedule an appointment as soon as possible for a visit.   Specialty: General Practice Contact information: [address] [place] [address] [phone]              Allergies  Allergen Reactions  . Ibuprofen Other (See Comments)    Does not take due to hx of renal insufficiency "I have kidney disease"   . Lemon Flavor Swelling    Severe Lip Swelling  FRUIT per pt.    . Amoxicillin Diarrhea and Other (See Comments)  . Tylenol [Acetaminophen] Hives    Cannot take large quantities    Consultations:  None  Procedures/Studies: CT Head Wo Contrast  Result Date: 05/05/2021 CLINICAL DATA:  Delirium EXAM: CT HEAD WITHOUT CONTRAST TECHNIQUE: Contiguous axial images were obtained from the base of the skull through the vertex without intravenous contrast. COMPARISON:  MRI 01/02/2017, CT brain 07/06/2016 FINDINGS: Brain: No evidence of acute infarction, hemorrhage, hydrocephalus, extra-axial collection or mass lesion/mass effect. Vascular: No hyperdense vessel or unexpected calcification. Skull: Normal. Negative for fracture or focal lesion.  Sinuses/Orbits: No acute finding. Other: None IMPRESSION: Negative non contrasted CT appearance of the brain Electronically Signed   By: Jasmine Pang M.D.   On: 05/05/2021 18:30   MR BRAIN WO CONTRAST  Result Date: 05/06/2021 CLINICAL DATA:  Initial evaluation for neuro deficit, stroke suspected. EXAM: MRI HEAD WITHOUT CONTRAST TECHNIQUE: Multiplanar, multiecho pulse sequences of the brain and surrounding structures were obtained without intravenous contrast. COMPARISON:  Prior head CT from earlier the same day. FINDINGS: Brain: Cerebral volume within normal limits for age. Few scattered patchy subcentimeter foci of T2/FLAIR hyperintensity noted involving the supratentorial cerebral white matter, nonspecific, but overall mild in nature in felt to be within normal limits for age. No abnormal foci of restricted diffusion to suggest acute or subacute ischemia. Gray-white matter differentiation maintained. No encephalomalacia to suggest chronic cortical infarction. No evidence for acute or chronic intracranial hemorrhage. No mass lesion, midline shift or mass effect. No hydrocephalus or extra-axial fluid collection. Pituitary gland suprasellar region within normal limits. Midline structures intact. Vascular: Major intracranial vascular flow voids are maintained. Skull and upper cervical spine: Craniocervical junction within normal limits. Bone marrow signal intensity normal. No scalp soft tissue abnormality. Sinuses/Orbits: Globes and orbital soft tissues demonstrate no acute finding. Paranasal sinuses are clear. No significant mastoid effusion. Inner ear structures grossly normal. Other: None. IMPRESSION: Normal brain MRI for age. No acute intracranial abnormality identified. Electronically Signed   By: Rise Mu M.D.   On: 05/06/2021 00:15   DG Chest Portable 1 View  Result Date: 05/05/2021 CLINICAL DATA:  Low blood pressure. EXAM: PORTABLE CHEST 1 VIEW COMPARISON:  November 12, 2020 FINDINGS: The  heart size and mediastinal contours are within normal limits. Both lungs are clear. The visualized skeletal structures are unremarkable. IMPRESSION: No active disease. Electronically Signed   By: Gerome Sam III M.D   On: 05/05/2021 19:00   DG Abd Portable 1V  Result Date: 05/05/2021 CLINICAL DATA:  Foreign body. EXAM: PORTABLE ABDOMEN - 1 VIEW COMPARISON:  None. FINDINGS: Normal bowel gas pattern. No evidence of obstruction. Moderate stool in the rectosigmoid colon. Suspected hiatal hernia. No radiopaque foreign body or implanted medical device. Mild scoliotic curvature of the spine. No acute osseous abnormalities are seen. IMPRESSION: 1. No radiopaque foreign body or implanted medical device. 2. Normal bowel gas pattern.  Suspected hiatal hernia. Electronically Signed   By: Shawna Orleans  Sanford M.D.   On: 05/05/2021 22:52    Subjective: Patient was seen and examined today.  Appears at her baseline.  She did not remember how much extra [place]iba she took but she did took an extra dose. Patient is on high doses of Valium and hydroxyzine along with multiple antidiabetics and need a close follow-up with primary care provider to come of with some of those meds.  Discharge Exam: Vitals:   05/06/21 0323 05/06/21 0700  BP: 94/60 94/66  Pulse: 79 69  Resp: 14 15  Temp: 98.2 F (36.8 C) 98.2 F (36.8 C)  SpO2: 98% 100%   Vitals:   05/05/21 2204 05/06/21 0323 05/06/21 0329 05/06/21 0700  BP: 101/75 94/60  94/66  Pulse: 75 79  69  Resp: 14 14  15   Temp: 98.7 F (37.1 C) 98.2 F (36.8 C)  98.2 F (36.8 C)  TempSrc: Oral Oral  Oral  SpO2: 99% 98%  100%  Weight:   41.1 kg     General: Pt is alert, awake, not in acute distress Cardiovascular: RRR, S1/S2 +, no rubs, no gallops Respiratory: CTA bilaterally, no wheezing, no rhonchi Abdominal: Soft, NT, ND, bowel sounds + Extremities: no edema, no cyanosis   The results of significant diagnostics from this hospitalization (including imaging,  microbiology, ancillary and laboratory) are listed below for reference.    Microbiology: Recent Results (from the past 240 hour(s))  Resp Panel by RT-PCR (Flu A&B, Covid) Nasopharyngeal Swab     Status: None   Collection Time: 05/05/21  6:30 PM   Specimen: Nasopharyngeal Swab; Nasopharyngeal(NP) swabs in vial transport medium  Result Value Ref Range Status   SARS Coronavirus 2 by RT PCR NEGATIVE NEGATIVE Final    Comment: (NOTE) SARS-CoV-2 target nucleic acids are NOT DETECTED.  The SARS-CoV-2 RNA is generally detectable in upper respiratory specimens during the acute phase of infection. The lowest concentration of SARS-CoV-2 viral copies this assay can detect is 138 copies/mL. A negative result does not preclude SARS-Cov-2 infection and should not be used as the sole basis for treatment or other patient management decisions. A negative result may occur with  improper specimen collection/handling, submission of specimen other than nasopharyngeal swab, presence of viral mutation(s) within the areas targeted by this assay, and inadequate number of viral copies(<138 copies/mL). A negative result must be combined with clinical observations, patient history, and epidemiological information. The expected result is Negative.  Fact Sheet for Patients:  BloggerCourse.comhttps://www.fda.gov/media/152166/download  Fact Sheet for Healthcare Providers:  SeriousBroker.ithttps://www.fda.gov/media/152162/download  This test is no t yet approved or cleared by the [place] FDA and  has been authorized for detection and/or diagnosis of SARS-CoV-2 by FDA under an Emergency Use Authorization (EUA). This EUA will remain  in effect (meaning this test can be used) for the duration of the COVID-19 declaration under Section 564(b)(1) of the Act, 21 U.S.C.section 360bbb-3(b)(1), unless the authorization is terminated  or revoked sooner.       Influenza A by PCR NEGATIVE NEGATIVE Final   Influenza B by PCR NEGATIVE NEGATIVE Final     Comment: (NOTE) The Xpert Xpress SARS-CoV-2/FLU/RSV plus assay is intended as an aid in the diagnosis of influenza from Nasopharyngeal swab specimens and should not be used as a sole basis for treatment. Nasal washings and aspirates are unacceptable for Xpert Xpress SARS-CoV-2/FLU/RSV testing.  Fact Sheet for Patients: BloggerCourse.comhttps://www.fda.gov/media/152166/download  Fact Sheet for Healthcare Providers: SeriousBroker.ithttps://www.fda.gov/media/152162/download  This test is not yet approved or cleared by the [place] FDA  and has been authorized for detection and/or diagnosis of SARS-CoV-2 by FDA under an Emergency Use Authorization (EUA). This EUA will remain in effect (meaning this test can be used) for the duration of the COVID-19 declaration under Section 564(b)(1) of the Act, 21 U.S.C. section 360bbb-3(b)(1), unless the authorization is terminated or revoked.  Performed at [facility], [address], [place], [place] [address]   MRSA PCR Screening     Status: None   Collection Time: 05/05/21 10:25 PM   Specimen: Nasal Mucosa; Nasopharyngeal  Result Value Ref Range Status   MRSA by PCR NEGATIVE NEGATIVE Final    Comment:        The GeneXpert MRSA Assay (FDA approved for NASAL specimens only), is one component of a comprehensive MRSA colonization surveillance program. It is not intended to diagnose MRSA infection nor to guide or monitor treatment for MRSA infections. Performed at [facility], [address], [place], [place] [address]      Labs: BNP (last 3 results) No results for input(s): BNP in the last 8760 hours. Basic Metabolic Panel: Recent Labs  Lab 05/05/21 1844 05/05/21 2212 05/06/21 0051  NA 134*  --  136  K 3.9  --  4.3  CL 100  --  107  CO2 24  --  22  GLUCOSE 168*  --  209*  BUN 31*  --  29*  CREATININE 2.05* 1.89* 1.75*  CALCIUM 9.4  --  9.1   Liver Function Tests: Recent Labs  Lab 05/05/21 1844 05/06/21 0051  AST 24 27  ALT 81*  72*  ALKPHOS 85 80  BILITOT 0.4 0.7  PROT 6.2* 5.9*  ALBUMIN 3.3* 3.1*   No results for input(s): LIPASE, AMYLASE in the last 168 hours. Recent Labs  Lab 05/05/21 1833  AMMONIA 12   CBC: Recent Labs  Lab 05/05/21 1844 05/05/21 2212 05/06/21 0051  WBC 10.8* 10.3 8.9  NEUTROABS 8.2*  --   --   HGB 11.2* 11.2* 10.4*  HCT 34.8* 34.2* 32.6*  MCV 89.9 87.5 89.3  PLT 285 219 240   Cardiac Enzymes: No results for input(s): CKTOTAL, CKMB, CKMBINDEX, TROPONINI in the last 168 hours. BNP: Invalid input(s): POCBNP CBG: Recent Labs  Lab 05/06/21 0321 05/06/21 0627 05/06/21 0828 05/06/21 1213 05/06/21 1341  GLUCAP 192* 138* 256* 328* 285*   D-Dimer No results for input(s): DDIMER in the last 72 hours. Hgb A1c Recent Labs    05/06/21 0541  HGBA1C 13.0*   Lipid Profile No results for input(s): CHOL, HDL, LDLCALC, TRIG, CHOLHDL, LDLDIRECT in the last 72 hours. Thyroid function studies No results for input(s): TSH, T4TOTAL, T3FREE, THYROIDAB in the last 72 hours.  Invalid input(s): FREET3 Anemia work up No results for input(s): VITAMINB12, FOLATE, FERRITIN, TIBC, IRON, RETICCTPCT in the last 72 hours. Urinalysis    Component Value Date/Time   COLORURINE STRAW (A) 05/06/2021 0500   APPEARANCEUR CLEAR 05/06/2021 0500   LABSPEC 1.012 05/06/2021 0500   PHURINE 5.0 05/06/2021 0500   GLUCOSEU >=500 (A) 05/06/2021 0500   HGBUR NEGATIVE 05/06/2021 0500   BILIRUBINUR NEGATIVE 05/06/2021 0500   BILIRUBINUR negative 03/22/2021 1137   KETONESUR NEGATIVE 05/06/2021 0500   PROTEINUR NEGATIVE 05/06/2021 0500   UROBILINOGEN 0.2 03/22/2021 1137   UROBILINOGEN 1.0 09/28/2018 1246   NITRITE NEGATIVE 05/06/2021 0500   LEUKOCYTESUR SMALL (A) 05/06/2021 0500   Sepsis Labs Invalid input(s): PROCALCITONIN,  WBC,  LACTICIDVEN Microbiology Recent Results (from the past 240 hour(s))  Resp Panel by RT-PCR (Flu A&B, Covid) Nasopharyngeal Swab     Status: None   Collection Time:  05/05/21  6:30 PM   Specimen: Nasopharyngeal Swab; Nasopharyngeal(NP) swabs in vial transport medium  Result Value Ref Range Status   SARS Coronavirus 2 by RT PCR NEGATIVE NEGATIVE Final    Comment: (NOTE) SARS-CoV-2 target nucleic acids are NOT DETECTED.  The SARS-CoV-2 RNA is generally detectable in upper respiratory specimens during the acute phase of infection. The lowest concentration of SARS-CoV-2 viral copies this assay can detect is 138 copies/mL. A negative result does not preclude SARS-Cov-2 infection and should not be used as the sole basis for treatment or other patient management decisions. A negative result may occur with  improper specimen collection/handling, submission of specimen other than nasopharyngeal swab, presence of viral mutation(s) within the areas targeted by this assay, and inadequate number of viral copies(<138 copies/mL). A negative result must be combined with clinical observations, patient history, and epidemiological information. The expected result is Negative.  Fact Sheet for Patients:  BloggerCourse.com  Fact Sheet for Healthcare Providers:  SeriousBroker.it  This test is no t yet approved or cleared by the [place] FDA and  has been authorized for detection and/or diagnosis of SARS-CoV-2 by FDA under an Emergency Use Authorization (EUA). This EUA will remain  in effect (meaning this test can be used) for the duration of the COVID-19 declaration under Section 564(b)(1) of the Act, 21 U.S.C.section 360bbb-3(b)(1), unless the authorization is terminated  or revoked sooner.       Influenza A by PCR NEGATIVE NEGATIVE Final   Influenza B by PCR NEGATIVE NEGATIVE Final    Comment: (NOTE) The Xpert Xpress SARS-CoV-2/FLU/RSV plus assay is intended as an aid in the diagnosis of influenza from Nasopharyngeal swab specimens and should not be used as a sole basis for treatment. Nasal washings  and aspirates are unacceptable for Xpert Xpress SARS-CoV-2/FLU/RSV testing.  Fact Sheet for Patients: BloggerCourse.com  Fact Sheet for Healthcare Providers: SeriousBroker.it  This test is not yet approved or cleared by the [place] FDA and has been authorized for detection and/or diagnosis of SARS-CoV-2 by FDA under an Emergency Use Authorization (EUA). This EUA will remain in effect (meaning this test can be used) for the duration of the COVID-19 declaration under Section 564(b)(1) of the Act, 21 U.S.C. section 360bbb-3(b)(1), unless the authorization is terminated or revoked.  Performed at [facility], [address] [place], [place] [address]   MRSA PCR Screening     Status: None   Collection Time: 05/05/21 10:25 PM   Specimen: Nasal Mucosa; Nasopharyngeal  Result Value Ref Range Status   MRSA by PCR NEGATIVE NEGATIVE Final    Comment:        The GeneXpert MRSA Assay (FDA approved for NASAL specimens only), is one component of a comprehensive MRSA colonization surveillance program. It is not intended to diagnose MRSA infection nor to guide or monitor treatment for MRSA infections. Performed at [facility], [address] [place], [place] [address]     Time coordinating discharge: Over 30 minutes  SIGNED:  Arnetha Courser, MD  [facility] 05/06/2021, 2:30 PM  If 7PM-7AM, please contact night-coverage [email or web address]  This record has been created using Conservation officer, historic buildings. Errors have been sought and corrected,but may not always be located. Such creation errors do not reflect on the standard of care.

## 2021-05-07 ENCOUNTER — Other Ambulatory Visit: Payer: Self-pay | Admitting: Internal Medicine

## 2021-05-07 MED ORDER — GLUCOSE BLOOD VI STRP: ORAL_STRIP | 12 refills | Status: DC

## 2021-05-09 ENCOUNTER — Other Ambulatory Visit (HOSPITAL_COMMUNITY): Payer: Self-pay

## 2021-05-09 ENCOUNTER — Ambulatory Visit (INDEPENDENT_AMBULATORY_CARE_PROVIDER_SITE_OTHER): Payer: Medicare Other | Admitting: Nurse Practitioner

## 2021-05-09 ENCOUNTER — Telehealth: Payer: Self-pay

## 2021-05-09 ENCOUNTER — Encounter: Payer: Self-pay | Admitting: Nurse Practitioner

## 2021-05-09 ENCOUNTER — Other Ambulatory Visit: Payer: Self-pay

## 2021-05-09 VITALS — BP 122/80 | HR 104 | Temp 98.5°F | Ht 61.6 in | Wt 92.8 lb

## 2021-05-09 DIAGNOSIS — E119 Type 2 diabetes mellitus without complications: Secondary | ICD-10-CM

## 2021-05-09 DIAGNOSIS — E162 Hypoglycemia, unspecified: Secondary | ICD-10-CM | POA: Diagnosis not present

## 2021-05-09 MED ORDER — GLIPIZIDE 5 MG PO TABS: ORAL_TABLET | ORAL | 1 refills | Status: DC

## 2021-05-09 MED ORDER — INSULIN PEN NEEDLE 32G X 4 MM MISC: 1.0000 | Freq: Two times a day (BID) | 3 refills | Status: DC

## 2021-05-09 NOTE — Telephone Encounter (Signed)
I called patient to perform tcm call and schedule her for a f/u. I left pt vm to call the office [facility]

## 2021-05-09 NOTE — Progress Notes (Signed)
I,Tianna Badgett,acting as a Neurosurgeon for [employer], FNP.,have documented all relevant documentation on the behalf of Arnette Felts, FNP,as directed by  Arnette Felts, FNP while in the presence of Arnette Felts, FNP.  This visit occurred during the SARS-CoV-2 public health emergency.  Safety protocols were in place, including screening questions prior to the visit, additional usage of staff PPE, and extensive cleaning of exam room while observing appropriate contact time as indicated for disinfecting solutions.  Subjective:     Patient ID: Sherri [name] , female    DOB: 1966/06/03 , 55 y.o.   MRN: [id number]   Chief Complaint  Patient presents with  . Diabetes    HPI  She reports when she ate dinner before 830-900pm then would eat cereal or can of corn at 1 am.  Evaristo Bury 18 units daily over the weekend. She was seen in the ER after being seen at the office on Thursday due to lethargy and weakness, upon arrival to the ER her blood sugar was 34, she had taken [place].  The day she came in on Thursday - she took 10 units, she took an additional 15 units after that within 2 hours. She does remember tripping over anything and fell. She reports she had not eaten and slept for several hours.   She is trying to get into Dr. Joycelyn Das office for her vitamin B12 injection. She has called this morning and is awaiting an appt.  Wt Readings from Last 3 Encounters: 05/09/21 : 92 lb 12.8 oz (42.1 kg) 05/06/21 : 90 lb 9.7 oz (41.1 kg) 05/05/21 : 84 lb (38.1 kg)     Diabetes She presents for her follow-up diabetic visit. She has type 2 diabetes mellitus. Hypoglycemia symptoms include confusion. Pertinent negatives for hypoglycemia include no dizziness or headaches. There are no diabetic associated symptoms. Pertinent negatives for diabetes include no chest pain. There are no hypoglycemic complications. There are no diabetic complications. Risk factors for coronary artery disease include  sedentary lifestyle. Current diabetic treatment includes oral agent (dual therapy).     Past Medical History:  Diagnosis Date  . Angioedema   . Anxiety   . Arthritis   . Asthma   . Benzodiazepine withdrawal (HCC) 08/30/2012  . CAP (community acquired pneumonia) 08/29/2012  . Chronic back pain   . Chronic kidney disease   . Closed nondisplaced fracture of proximal phalanx of right little finger 10/18/2018  . Cocaine abuse (HCC)   . Depression   . Diarrhea   . DVT (deep venous thrombosis) (HCC)   . Environmental allergies   . Fall   . GERD (gastroesophageal reflux disease)   . Heart murmur    has been told once that she has a heart murmur, but has never had any problems  . Hiatal hernia 03/21/2013  . Lumbar herniated disc   . Peripheral vascular disease (HCC)   . Pernicious anemia 10/30/2011  . Postconcussion syndrome 12/30/2014  . Rhabdomyolysis 08/27/2012  . Seasonal allergies   . Stiff person syndrome   . Urticaria      Family History  Problem Relation Age of Onset  . Cancer Mother   . Other Mother   . COPD Father   . Asthma Brother   . Cancer Brother        colon  . Heart attack Brother   . Seizures Brother   . Allergic rhinitis Neg Hx   . Angioedema Neg Hx   . Eczema Neg Hx   . Immunodeficiency  Neg Hx   . Urticaria Neg Hx      Current Outpatient Medications:  .  DIAZEPAM, 20 MG DOSE, NA, Place into the nose., Disp: , Rfl:  .  Insulin Pen Needle 32G X 4 MM MISC, 1 each by Does not apply route 2 (two) times daily. Dx E11.9, Disp: 200 each, Rfl: 3 .  albuterol (VENTOLIN HFA) 108 (90 Base) MCG/ACT inhaler, TAKE 2 PUFFS BY MOUTH EVERY 6 HOURS AS NEEDED FOR WHEEZE OR SHORTNESS OF BREATH (Patient taking differently: Inhale 2 puffs into the lungs every 6 (six) hours as needed for wheezing or shortness of breath.), Disp: 8.5 each, Rfl: 1 .  Ascorbic Acid (VITAMIN C) 1000 MG tablet, Take 1,000 mg by mouth daily., Disp: , Rfl:  .  baclofen (LIORESAL) 20 MG tablet, TAKE 1 AND  1/2 TABLETS BY  MOUTH IN THE MORNING AND IN THE EVENING AND 1 TABLET AT MIDDAY (Patient taking differently: Take 20 mg by mouth 3 (three) times daily.), Disp: 360 tablet, Rfl: 3 .  Cholecalciferol (VITAMIN D3) 10 MCG (400 UNIT) tablet, Take 400 Units by mouth daily., Disp: , Rfl:  .  Cranberry-Vitamin C-Vitamin E 4200-20-3 MG-MG-UNIT CAPS, Take 1 tablet by mouth daily., Disp: , Rfl:  .  cyanocobalamin (,VITAMIN B-12,) 1000 MCG/ML injection, Inject 1,000 mcg into the muscle every 3 (three) months., Disp: , Rfl:  .  dapagliflozin propanediol (FARXIGA) 5 MG TABS tablet, Take 1 tablet (5 mg total) by mouth daily before breakfast., Disp: 90 tablet, Rfl: 1 .  desloratadine (CLARINEX) 5 MG tablet, TAKE 1 TABLET BY MOUTH  DAILY (Patient taking differently: Take 5 mg by mouth daily.), Disp: 90 tablet, Rfl: 0 .  diazepam (VALIUM) 10 MG tablet, TAKE 2 TABLETS BY MOUTH 3  TIMES DAILY as needed, Disp: 180 tablet, Rfl: 4 .  EPINEPHRINE 0.3 mg/0.3 mL IJ SOAJ injection, INJECT CONTENTS OF 1 PEN AS NEEDED FOR ALLERGIC  RESPONSE AS DIRECTED BY MD. SEEK MEDICAL ATTENTION  AFTER USE. (Patient taking differently: Inject 0.3 mg into the muscle as needed for anaphylaxis.), Disp: 2 each, Rfl: 0 .  famotidine (PEPCID) 20 MG tablet, TAKE 1 TABLET BY MOUTH  TWICE DAILY, Disp: 180 tablet, Rfl: 3 .  FLUoxetine (PROZAC) 10 MG capsule, TAKE 1 CAPSULE BY MOUTH  DAILY (Patient taking differently: Take 10 mg by mouth at bedtime.), Disp: 90 capsule, Rfl: 3 .  gabapentin (NEURONTIN) 300 MG capsule, TAKE 1 CAPSULE BY MOUTH TWO TIMES DAILY (Patient taking differently: Take 300 mg by mouth 2 (two) times daily. TAKE 1 CAPSULE BY MOUTH TWO TIMES DAILY), Disp: 180 capsule, Rfl: 3 .  glipiZIDE (GLUCOTROL) 5 MG tablet, TAKE 1 TABLET BY MOUTH EVERY DAY AT LUNCH, Disp: 90 tablet, Rfl: 1 .  glucose blood test strip, Use as instructed to check sugars qac and qhs, Disp: 100 each, Rfl: 12 .  hydrOXYzine (VISTARIL) 100 MG capsule, TAKE 1 CAPSULE BY  MOUTH 3  TIMES DAILY AS NEEDED FOR  ITCHING (Patient taking differently: Take 100 mg by mouth 3 (three) times daily.), Disp: 90 capsule, Rfl: 1 .  insulin degludec (TRESIBA FLEXTOUCH) 100 UNIT/ML FlexTouch Pen, Inject 15 Units into the skin daily., Disp: 15 mL, Rfl: 0 .  metFORMIN (GLUCOPHAGE) 500 MG tablet, TAKE 2 TABLETS (1,000 MG TOTAL) BY MOUTH 2 (TWO) TIMES DAILY WITH A MEAL. (Patient taking differently: Take 1,000 mg by mouth 2 (two) times daily with a meal.), Disp: 360 tablet, Rfl: 1 .  Olopatadine HCl 0.7 % SOLN,  Place 1 drop into both eyes daily., Disp: , Rfl:  .  vitamin E 180 MG (400 UNITS) capsule, Take 400 Units by mouth daily. , Disp: , Rfl:    Allergies  Allergen Reactions  . Ibuprofen Other (See Comments)    Does not take due to hx of renal insufficiency "I have kidney disease"   . Lemon Flavor Swelling    Severe Lip Swelling  FRUIT per pt.    . Amoxicillin Diarrhea and Other (See Comments)  . Tylenol [Acetaminophen] Hives    Cannot take large quantities     Review of Systems  Constitutional: Negative.   Respiratory: Negative.   Cardiovascular: Negative.  Negative for chest pain, palpitations and leg swelling.  Gastrointestinal: Negative.   Neurological: Negative.  Negative for dizziness and headaches.  Psychiatric/Behavioral: Positive for confusion.     Today's Vitals   05/09/21 1422  BP: 122/80  Pulse: (!) 104  Temp: 98.5 F (36.9 C)  TempSrc: Oral  Weight: 92 lb 12.8 oz (42.1 kg)  Height: 5' 1.6" (1.565 m)   Body mass index is 17.19 kg/m.  Wt Readings from Last 3 Encounters:  05/09/21 92 lb 12.8 oz (42.1 kg)  05/06/21 90 lb 9.7 oz (41.1 kg)  05/05/21 84 lb (38.1 kg)    Objective:  Physical Exam Vitals reviewed.  Constitutional:      General: She is not in acute distress.    Appearance: Normal appearance.     Comments: Thin   Cardiovascular:     Rate and Rhythm: Normal rate and regular rhythm.     Pulses: Normal pulses.     Heart sounds:  Normal heart sounds. No murmur heard.   Neurological:     General: No focal deficit present.     Mental Status: She is alert and oriented to person, place, and time.     Cranial Nerves: No cranial nerve deficit.     Motor: No weakness.  Psychiatric:        Mood and Affect: Mood normal.        Behavior: Behavior normal.        Thought Content: Thought content normal.        Judgment: Judgment normal.         Assessment And Plan:     1. Type 2 diabetes mellitus without complication, without long-term current use of insulin (HCC)  She has been having an unstable blood sugar. She admits to taking too much insulin. I will refer her to endocrinology to assist with her management. She will be able to see a diabetic educator as well.  - glipiZIDE (GLUCOTROL) 5 MG tablet; TAKE 1 TABLET BY MOUTH EVERY DAY AT LUNCH  Dispense: 90 tablet; Refill: 1 - Ambulatory referral to Endocrinology - Insulin Pen Needle 32G X 4 MM MISC; 1 each by Does not apply route 2 (two) times daily. Dx E11.9  Dispense: 200 each; Refill: 3  2. Hypoglycemia  Her blood sugar dropped to 37 by the time she made it to the ER likely related to accidental overdose of insulin. I have educated again. I will also send a Rx for GVOKE in the event her blood sugar drops below 70.    Patient was given opportunity to ask questions. Patient verbalized understanding of the plan and was able to repeat key elements of the plan. All questions were answered to their satisfaction.  Arnette Felts, FNP   I, Arnette Felts, FNP, have reviewed all documentation for this visit. The  documentation on 05/19/21 for the exam, diagnosis, procedures, and orders are all accurate and complete.   IF YOU HAVE BEEN REFERRED TO A SPECIALIST, IT MAY TAKE 1-2 WEEKS TO SCHEDULE/PROCESS THE REFERRAL. IF YOU HAVE NOT HEARD FROM US/SPECIALIST IN TWO WEEKS, PLEASE GIVE [place] A CALL AT [phone].   THE PATIENT IS ENCOURAGED TO PRACTICE SOCIAL DISTANCING DUE TO  THE COVID-19 PANDEMIC.

## 2021-05-09 NOTE — Telephone Encounter (Signed)
Transition Care Management Follow-up Telephone Call  Date of discharge and from where: 05/06/21 [place]   How have you been since you were released from the hospital? she's feeling better  Any questions or concerns? She has questions about her medications and also needs help with meals she is unsure of what to eat.  Items Reviewed:  Did the pt receive and understand the discharge instructions provided? Kind of   Medications obtained and verified?she said she tried too   Any new allergies since your discharge? none  Dietary orders reviewed? no  Do you have support at home? Yes her sister  Other (ie: DME, Home Health, etc) no  Functional Questionnaire: (I = Independent and D = Dependent)  Bathing/Dressing- I   Meal Prep- I  Eating- I  Maintaining continence- I  Transferring/Ambulation- I  Managing Meds- I   Follow up appointments reviewed:    PCP Hospital f/u appt confirmed?  scheduled to see Arnette Felts on 05/09/21 @ 2:30pm  Specialist Hospital f/u appt confirmed? none  Are transportation arrangements needed? no  If their condition worsens, is the pt aware to call  their PCP or go to the ED? yes  Was the patient provided with contact information for the PCP's office or ED? yes  Was the pt encouraged to call back with questions or concerns? yes

## 2021-05-09 NOTE — Patient Instructions (Addendum)
Insulin Injection Instructions, Using Insulin Pens, Adult There are many different types of insulin. The type of insulin that you take may determine how many injections you give yourself and when you need to give the injections. Supplies needed:  Soap and water.  Your insulin pen.  A new needle.  Alcohol wipes.  A disposal container for sharp items (sharps container), such as an empty plastic bottle with a cover. How to choose a site for injection The body absorbs insulin differently, depending on where the insulin is injected (injection site). It is best to inject insulin into the same body area each time (for example, always in the abdomen), but you should use a different spot in that area for each injection. Do not inject the insulin in the same spot each time. There are five main areas that can be used for injecting. These areas are:  Abdomen. This is the preferred area.  Front of thigh.  Upper, outer side of thigh.  Upper, outer side of arm.  Upper, outer part of buttock.   How to use an insulin pen Get ready 1. Wash your hands with soap and water. If soap and water are not available, use hand sanitizer. 2. Test your blood sugar (glucose) level and write down that number. Follow any instructions from your health care provider about what to do if your blood glucose level is higher or lower than your normal range. 3. Check the expiration date and the type of insulin that is in the pen. 4. If you are using CLEAR insulin, check to see that it is clear and free of clumps. 5. If you are using CLOUDY insulin, mix it by gently rolling the insulin pen between your palms several times. Do not shake the pen. 6. Remove the cap from the insulin pen. 7. Use an alcohol wipe to clean the rubber tip of the pen. 8. Remove the protective paper tab from the disposable needle. Do not let the needle touch anything. 9. Screw a new, unused needle onto the pen. 10. Remove the outer plastic needle  cover. Do not throw away the outer plastic cover yet. ? If the pen uses a special safety needle, leave the inner needle shield in place. ? If the pen does not use a special safety needle, remove the inner plastic cover from the needle. 11. Follow the manufacturer's instructions to prime the insulin pen with the volume of insulin needed. Hold the pen with the needle pointing up, and push the button on the opposite end of the pen until a drop of insulin appears at the needle tip. If no insulin appears, repeat this step. 12. Turn the button (dial) to the number of units of insulin that you will be injecting. Inject the insulin 1. Use an alcohol wipe to clean the site where you will be inserting the needle. Let the site air-dry. 2. Hold the pen in the palm of your writing hand like a pencil. 3. If directed by your health care provider, use your other hand to pinch and hold about 1 inch (2.5 cm) of skin at the injection site. Do not directly touch the cleaned part of the skin. 4. Gently but quickly, use your writing hand to put the needle straight into the skin. Insert the needle at a 45-degree angle or a 90-degree angle (perpendicular) to the skin, as directed by your health care provider. 5. When the needle is completely inserted into the skin, let go of the skin that you are  pinching. 6. Use your thumb or index finger of your writing hand to push the top button of the pen all the way to inject the insulin. Continue to hold the pen in place with your writing hand. 7. Wait 10 seconds, then pull the needle straight out of the skin. This will allow all of the insulin to go from the pen and needle into your body. 8. Carefully put the larger (outer) plastic cover of the needle back over the needle, then unscrew the capped needle and discard it in a sharps container, such as an empty plastic bottle with a cover. 9. Put the plastic cap back on the insulin pen.   How to throw away supplies  Discard all used  needles in a sharps container.  Follow the disposal regulations for the area where you live. Do not use any needle more than one time.  Throw away empty disposable pens in the regular trash. Questions to ask your health care provider  How often should I be taking insulin?  How often should I check my blood glucose?  What amount of insulin should I be taking at each time?  What are the side effects?  What should I do if my blood glucose is too high?  What should I do if my blood glucose is too low?  What should I do if I forget to take my insulin?  What number should I call if I have questions? Where to find more information  American Diabetes Association (ADA): www.diabetes.org  Association of Diabetes Care and Education Specialists (ADCES): www.diabeteseducator.org Summary  Before you give yourself an insulin injection, be sure to wash your hands and test your blood sugar level. Write down that number.  Check the expiration date and the type of insulin that is in the pen. The type of insulin that you take may determine how many injections you give yourself and when you need to give the injections.  It is best to inject insulin into the same body area each time (for example, always in the abdomen), but you should use a different spot in that area for each injection.  Do not use a needle more than one time. This information is not intended to replace advice given to you by your health care provider. Make sure you discuss any questions you have with your health care provider. Document Revised: 01/15/2020 Document Reviewed: 01/15/2020 Elsevier Patient Education  2021 [employer].   Only take Sherri [name] in am.   Will refer to Endocrinology

## 2021-05-10 ENCOUNTER — Other Ambulatory Visit: Payer: Self-pay | Admitting: Nurse Practitioner

## 2021-05-10 ENCOUNTER — Other Ambulatory Visit (HOSPITAL_COMMUNITY): Payer: Self-pay

## 2021-05-10 ENCOUNTER — Other Ambulatory Visit: Payer: Self-pay | Admitting: Neurology

## 2021-05-10 DIAGNOSIS — Z1231 Encounter for screening mammogram for malignant neoplasm of breast: Secondary | ICD-10-CM

## 2021-05-11 ENCOUNTER — Telehealth: Payer: Self-pay

## 2021-05-11 NOTE — Telephone Encounter (Signed)
Returned pts call to r/s no show appt, aware of new appt   Sherri [name]

## 2021-05-16 ENCOUNTER — Other Ambulatory Visit: Payer: Self-pay | Admitting: Nurse Practitioner

## 2021-05-18 ENCOUNTER — Ambulatory Visit: Payer: Self-pay

## 2021-05-18 ENCOUNTER — Telehealth: Payer: Medicare Other

## 2021-05-18 DIAGNOSIS — E119 Type 2 diabetes mellitus without complications: Secondary | ICD-10-CM

## 2021-05-18 DIAGNOSIS — G2582 Stiff-man syndrome: Secondary | ICD-10-CM

## 2021-05-18 DIAGNOSIS — F32A Depression, unspecified: Secondary | ICD-10-CM

## 2021-05-18 DIAGNOSIS — J45909 Unspecified asthma, uncomplicated: Secondary | ICD-10-CM

## 2021-05-19 ENCOUNTER — Telehealth: Payer: Self-pay | Admitting: Nurse Practitioner

## 2021-05-19 MED ORDER — GVOKE HYPOPEN 2-PACK 0.5 MG/0.1ML ~~LOC~~ SOAJ: 0.1000 mL | SUBCUTANEOUS | 5 refills | Status: DC | PRN

## 2021-05-19 NOTE — Telephone Encounter (Signed)
Called her to make her aware I sent her GVOKE for a low blood sugar less than 70, she states was as low as 50 earlier today, she had to eat pancakes and chocolate kisses. Her blood sugars have been ranging 50-198 and one 200. She is taking 15 units of tresiba.  I have advised her to decrease her tresiba to 13 units daily.

## 2021-05-24 ENCOUNTER — Ambulatory Visit: Payer: Self-pay

## 2021-05-24 ENCOUNTER — Other Ambulatory Visit: Payer: Self-pay | Admitting: Nurse Practitioner

## 2021-05-24 ENCOUNTER — Telehealth: Payer: Medicare Other

## 2021-05-24 DIAGNOSIS — E119 Type 2 diabetes mellitus without complications: Secondary | ICD-10-CM

## 2021-05-24 DIAGNOSIS — J45909 Unspecified asthma, uncomplicated: Secondary | ICD-10-CM | POA: Diagnosis not present

## 2021-05-24 DIAGNOSIS — F32A Depression, unspecified: Secondary | ICD-10-CM

## 2021-05-24 DIAGNOSIS — G2582 Stiff-man syndrome: Secondary | ICD-10-CM

## 2021-05-24 DIAGNOSIS — K219 Gastro-esophageal reflux disease without esophagitis: Secondary | ICD-10-CM

## 2021-05-24 MED ORDER — TRESIBA FLEXTOUCH 100 UNIT/ML ~~LOC~~ SOPN: 13.0000 [IU] | PEN_INJECTOR | Freq: Every day | SUBCUTANEOUS | 5 refills | Status: DC

## 2021-05-24 NOTE — Chronic Care Management (AMB) (Signed)
Chronic Care Management   CCM RN Visit Note  05/24/2021 Name: Sherri [name] MRN: [id number] DOB: 11/03/1966  Subjective: Sherri [name] is a 55 y.o. year old female who is a primary care patient of Arnette Felts, FNP. The care management team was consulted for assistance with disease management and care coordination needs.    Engaged with patient by telephone for follow up visit in response to provider referral for case management and/or care coordination services.   Consent to Services:  The patient was given information about Chronic Care Management services, agreed to services, and gave verbal consent prior to initiation of services.  Please see initial visit note for detailed documentation.   Patient agreed to services and verbal consent obtained.   Assessment: Review of patient past medical history, allergies, medications, health status, including review of consultants reports, laboratory and other test data, was performed as part of comprehensive evaluation and provision of chronic care management services.   SDOH (Social Determinants of Health) assessments and interventions performed:  Yes, no acute needs identified   CCM Care Plan  Allergies  Allergen Reactions  . Ibuprofen Other (See Comments)    Does not take due to hx of renal insufficiency "I have kidney disease"   . Lemon Flavor Swelling    Severe Lip Swelling  FRUIT per pt.    . Amoxicillin Diarrhea and Other (See Comments)  . Tylenol [Acetaminophen] Hives    Cannot take large quantities    Outpatient Encounter Medications as of 05/24/2021  Medication Sig  . albuterol (VENTOLIN HFA) 108 (90 Base) MCG/ACT inhaler TAKE 2 PUFFS BY MOUTH EVERY 6 HOURS AS NEEDED FOR WHEEZE OR SHORTNESS OF BREATH (Patient taking differently: Inhale 2 puffs into the lungs every 6 (six) hours as needed for wheezing or shortness of breath.)  . Ascorbic Acid (VITAMIN C) 1000 MG tablet Take 1,000 mg by mouth daily.  . baclofen (LIORESAL)  20 MG tablet TAKE 1 AND 1/2 TABLETS BY  MOUTH IN THE MORNING AND IN THE EVENING AND 1 TABLET AT MIDDAY (Patient taking differently: Take 20 mg by mouth 3 (three) times daily.)  . Cholecalciferol (VITAMIN D3) 10 MCG (400 UNIT) tablet Take 400 Units by mouth daily.  . Cranberry-Vitamin C-Vitamin E 4200-20-3 MG-MG-UNIT CAPS Take 1 tablet by mouth daily.  . cyanocobalamin (,VITAMIN B-12,) 1000 MCG/ML injection Inject 1,000 mcg into the muscle every 3 (three) months.  . dapagliflozin propanediol (FARXIGA) 5 MG TABS tablet Take 1 tablet (5 mg total) by mouth daily before breakfast.  . desloratadine (CLARINEX) 5 MG tablet TAKE 1 TABLET BY MOUTH  DAILY (Patient taking differently: Take 5 mg by mouth daily.)  . diazepam (VALIUM) 10 MG tablet TAKE 2 TABLETS BY MOUTH 3  TIMES DAILY as needed  . DIAZEPAM, 20 MG DOSE, NA Place into the nose.  [name] EPINEPHRINE 0.3 mg/0.3 mL IJ SOAJ injection INJECT CONTENTS OF 1 PEN AS NEEDED FOR ALLERGIC  RESPONSE AS DIRECTED BY MD. SEEK MEDICAL ATTENTION  AFTER USE. (Patient taking differently: Inject 0.3 mg into the muscle as needed for anaphylaxis.)  . famotidine (PEPCID) 20 MG tablet TAKE 1 TABLET BY MOUTH  TWICE DAILY  . FLUoxetine (PROZAC) 10 MG capsule TAKE 1 CAPSULE BY MOUTH  DAILY (Patient taking differently: Take 10 mg by mouth at bedtime.)  . gabapentin (NEURONTIN) 300 MG capsule TAKE 1 CAPSULE BY MOUTH TWO TIMES DAILY (Patient taking differently: Take 300 mg by mouth 2 (two) times daily. TAKE 1 CAPSULE BY MOUTH TWO TIMES DAILY)  .  glipiZIDE (GLUCOTROL) 5 MG tablet TAKE 1 TABLET BY MOUTH EVERY DAY AT LUNCH  . Glucagon (GVOKE HYPOPEN 2-PACK) 0.5 MG/0.1ML SOAJ Inject 0.1 mLs into the skin as needed (if blood sugar less than 70).  [name] glucose blood test strip Use as instructed to check sugars qac and qhs  . hydrOXYzine (VISTARIL) 100 MG capsule TAKE 1 CAPSULE BY MOUTH 3  TIMES DAILY AS NEEDED FOR  ITCHING (Patient taking differently: Take 100 mg by mouth 3 (three) times daily.)   . insulin degludec (TRESIBA FLEXTOUCH) 100 UNIT/ML FlexTouch Pen Inject 13 Units into the skin daily.  . Insulin Pen Needle 32G X 4 MM MISC 1 each by Does not apply route 2 (two) times daily. Dx E11.9  . metFORMIN (GLUCOPHAGE) 500 MG tablet TAKE 2 TABLETS (1,000 MG TOTAL) BY MOUTH 2 (TWO) TIMES DAILY WITH A MEAL. (Patient taking differently: Take 1,000 mg by mouth 2 (two) times daily with a meal.)  . Olopatadine HCl 0.7 % SOLN Place 1 drop into both eyes daily.  . vitamin E 180 MG (400 UNITS) capsule Take 400 Units by mouth daily.    No facility-administered encounter medications on file as of 05/24/2021.    Patient Active Problem List   Diagnosis Date Noted  . Hypoglycemia 05/05/2021  . AKI (acute kidney injury) (HCC) 05/05/2021  . Anxiety   . Chronic rhinitis 04/24/2019  . Keloid 10/10/2018  . Diabetes type 2, controlled (HCC) 10/10/2018  . Depression 01/02/2017  . History of DVT (deep vein thrombosis) 01/02/2017  . Chronic pain syndrome 01/02/2017  . Cocaine abuse (HCC) 01/02/2017  . Dysphagia   . Adjustment insomnia   . Distal radius fracture, right 08/16/2016  . Postconcussion syndrome 12/30/2014  . Tremor 03/11/2014  . 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 Hiatal hernia 03/21/2013  . Anemia, iron deficiency 01/29/2013  . Spasm of muscle 09/02/2012  . Anemia 08/28/2012  . Mild intermittent asthma 08/27/2012  . Chronic back pain   . Pernicious anemia 10/30/2011  . DEGENERATIVE DISC DISEASE 08/18/2010  . Asthma with bronchitis 07/28/2010  . Recurrent urticaria 07/28/2010    Conditions to be addressed/monitored:DMII, Stiff-Person Syndrome, Asthma with bronchitis, GERD, Depression  Care Plan : Diabetes Type 2 (Adult)  Updates made by [name], RN since 05/24/2021 12:00 AM    Problem: Glycemic Management (Diabetes, Type 2)   Priority: High    Long-Range Goal: Glycemic Management Optimized   Start Date: 01/31/2021  Expected End Date: 01/31/2022  Recent Progress: On  track  Priority: High  Note:   Current Barriers:  [name] Knowledge Deficits related to disease process and Self Health management of Asthma . 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 Financial difficulty buying food  . Relationship strain with boyfriend  Nurse Case Manager Clinical Goal(s):  [name] Patient will work with the CCM team and PCP to address needs related to disease education and support for improved Self Health management of Diabetes as evidence by patient will lower her A1c to 7.5 or below  CCM RN CM Interventions:  05/24/21 completed successful inbound call from patient  . Collaboration with Dorothyann Peng, MD regarding development and update of comprehensive plan of care as evidenced by provider attestation and co-signature . Inter-disciplinary care team collaboration (see longitudinal plan of care) . Provided education to patient about basic DM disease process . Review of patient status, including review of consultant's reports, relevant laboratory and other test results, and medications completed. [name]  Reviewed medications with patient and discussed importance of medication adherence . Educated patient on dietary and exercise recommendations; daily glycemic control FBS 80-130, <180 after meals;15'15' rule . Determined patient is adhering to monitoring her cbg's at home before meals and at bedtime, before and after exercise, she is experiencing some low's  . Determined patient is adhering to her newly prescribed medication regimen, although she is holding the am dose of Tresiba when sugars are low . Discussed patient received the following message from PCP, "Called her to make her aware I sent her GVOKE for a low blood sugar less than 70, she states was as low as 50 earlier today, she had to eat pancakes and chocolate kisses. Her blood sugars have been ranging 50-198 and one 200. She is taking 15 units of tresiba. I have advised  her to decrease her tresiba to 13 units daily." . Sent secure message to PCP regarding new Rx for Tresiba 13 units daily, 30 day supply, and [employer] test strips are needed . Sent urgent referral to embedded Pharm D regarding financial assistance is needed to help patient lower drug cost for several medications and to review guidelines for indication/dosage and frequency of Tresiba per patient's request . Reviewed scheduled/upcoming provider appointments including: next PCP follow up appointment scheduled for 06/08/21 @3 :00 PM  . Discussed plans with patient for ongoing care management follow up and provided patient with direct contact information for care management team Patient Self Care Activities:  . Follow up with Endocrinology as directed/referred by PCP . Follow with the Nutritionist once referred by PCP for meal planning  . Continue to follow DM dietary and exercise recommendations . Continue to monitor CBG's daily before meals and at bedtime, alerting CCM team and or PCP of abnormal results  . Work with the embedded Pharm D for evaluation of current treatment of DM and consideration for treatment change . Pick up Farxiga samples from PCP office and start this medication as directed . Increase your water intake and strictly adhere to low carb diet, especially while BS are elevated . Take all of your DM medications exactly as prescribed   Follow Up Plan: Telephone follow up appointment with care management team member scheduled for: 06/10/21   Plan:Telephone follow up appointment with care management team member scheduled for:  06/10/21  06/12/21, RN, BSN, CCM Care Management Coordinator [facility] Care Management/[facility]  Direct Phone: [phone]

## 2021-05-24 NOTE — Patient Instructions (Signed)
Goals Addressed      Diabetes treatment optimized   On track    Timeframe:  Long-Range Goal Priority:  High Start Date: 01/31/21/                            Expected End Date:  01/31/22  Follow Up Date: 06/10/21   Continue to follow DM dietary and exercise recommendations Continue to monitor CBG's daily before meals and at bedtime, alerting CCM team and or PCP of abnormal results  Work with the embedded Pharm D for evaluation of current treatment of DM and consideration for treatment change Follow up with Endocrinology as directed/referred by PCP  Pick up Farxiga samples from PCP office and start this medication as directed Follow up with the Nutritionist once referred by PCP  Increase your water intake and strictly adhere to low carb diet, especially while BS are elevated Take all of your DM medications exactly as prescribed                               

## 2021-05-25 ENCOUNTER — Telehealth: Payer: Self-pay

## 2021-05-25 ENCOUNTER — Ambulatory Visit (INDEPENDENT_AMBULATORY_CARE_PROVIDER_SITE_OTHER): Payer: Medicare Other

## 2021-05-25 DIAGNOSIS — F32A Depression, unspecified: Secondary | ICD-10-CM

## 2021-05-25 DIAGNOSIS — J45909 Unspecified asthma, uncomplicated: Secondary | ICD-10-CM

## 2021-05-25 DIAGNOSIS — K219 Gastro-esophageal reflux disease without esophagitis: Secondary | ICD-10-CM

## 2021-05-25 DIAGNOSIS — J4520 Mild intermittent asthma, uncomplicated: Secondary | ICD-10-CM

## 2021-05-25 DIAGNOSIS — E119 Type 2 diabetes mellitus without complications: Secondary | ICD-10-CM

## 2021-05-25 DIAGNOSIS — E118 Type 2 diabetes mellitus with unspecified complications: Secondary | ICD-10-CM

## 2021-05-25 NOTE — Chronic Care Management (AMB) (Signed)
Chronic Care Management Pharmacy Assistant   Name: Sherri [name]  MRN: [id number] DOB: February 01, 1966   Reason for Encounter: Adherence call/ Diabetes  Recent office visits:  05-05-2021 Arnette Felts, FNP  05-09-2021 Arnette Felts, FNP. START Glucagon 0.5 MG/ 0.1 ML inject 0.1 ML subcutaneous as needed when blood sugar drops below 70. CHANGE Glipizide 5 MG 1 tablet daily before meals to 1 tablet daily at lunch.  05-18-2021 Little, [name], RN (CCM)  05-24-2021  Little, [name], RN (CCM)   Recent consult visits:  None  Hospital visits:  Medication Reconciliation was completed by comparing discharge summary, patient's EMR and Pharmacy list, and upon discussion with patient.  Admitted to the hospital on 05-05-2021 due to Acute kidney injury,Hypoglycemia and hypotensive. Discharge date was 05-06-2021. Discharged from [facility].    New?Medications Started at [facility] Discharge:?? -None  Medication Changes at Hospital Discharge: -Diazepam 10 MG take 2 tablets 3 times daily as needed   Medications Discontinued at Hospital Discharge: -Tessalon 100 MG -Zyrtec 10 MG  Medications that remain the same after Hospital Discharge:??  -All other medications will remain the same.    Medications: Outpatient Encounter Medications as of 05/25/2021  Medication Sig  . albuterol (VENTOLIN HFA) 108 (90 Base) MCG/ACT inhaler TAKE 2 PUFFS BY MOUTH EVERY 6 HOURS AS NEEDED FOR WHEEZE OR SHORTNESS OF BREATH (Patient taking differently: Inhale 2 puffs into the lungs every 6 (six) hours as needed for wheezing or shortness of breath.)  . Ascorbic Acid (VITAMIN C) 1000 MG tablet Take 1,000 mg by mouth daily.  . baclofen (LIORESAL) 20 MG tablet TAKE 1 AND 1/2 TABLETS BY  MOUTH IN THE MORNING AND IN THE EVENING AND 1 TABLET AT MIDDAY (Patient taking differently: Take 20 mg by mouth 3 (three) times daily.)  . Cholecalciferol (VITAMIN D3) 10 MCG (400 UNIT) tablet Take 400 Units by mouth daily.  .  Cranberry-Vitamin C-Vitamin E 4200-20-3 MG-MG-UNIT CAPS Take 1 tablet by mouth daily.  . cyanocobalamin (,VITAMIN B-12,) 1000 MCG/ML injection Inject 1,000 mcg into the muscle every 3 (three) months.  . dapagliflozin propanediol (FARXIGA) 5 MG TABS tablet Take 1 tablet (5 mg total) by mouth daily before breakfast.  . desloratadine (CLARINEX) 5 MG tablet TAKE 1 TABLET BY MOUTH  DAILY (Patient taking differently: Take 5 mg by mouth daily.)  . diazepam (VALIUM) 10 MG tablet TAKE 2 TABLETS BY MOUTH 3  TIMES DAILY as needed  . DIAZEPAM, 20 MG DOSE, NA Place into the nose.  [name] EPINEPHRINE 0.3 mg/0.3 mL IJ SOAJ injection INJECT CONTENTS OF 1 PEN AS NEEDED FOR ALLERGIC  RESPONSE AS DIRECTED BY MD. SEEK MEDICAL ATTENTION  AFTER USE. (Patient taking differently: Inject 0.3 mg into the muscle as needed for anaphylaxis.)  . famotidine (PEPCID) 20 MG tablet TAKE 1 TABLET BY MOUTH  TWICE DAILY  . FLUoxetine (PROZAC) 10 MG capsule TAKE 1 CAPSULE BY MOUTH  DAILY (Patient taking differently: Take 10 mg by mouth at bedtime.)  . gabapentin (NEURONTIN) 300 MG capsule TAKE 1 CAPSULE BY MOUTH TWO TIMES DAILY (Patient taking differently: Take 300 mg by mouth 2 (two) times daily. TAKE 1 CAPSULE BY MOUTH TWO TIMES DAILY)  . glipiZIDE (GLUCOTROL) 5 MG tablet TAKE 1 TABLET BY MOUTH EVERY DAY AT LUNCH  . Glucagon (GVOKE HYPOPEN 2-PACK) 0.5 MG/0.1ML SOAJ Inject 0.1 mLs into the skin as needed (if blood sugar less than 70).  [name] glucose blood test strip Use as instructed to check sugars qac and  qhs  . hydrOXYzine (VISTARIL) 100 MG capsule TAKE 1 CAPSULE BY MOUTH 3  TIMES DAILY AS NEEDED FOR  ITCHING (Patient taking differently: Take 100 mg by mouth 3 (three) times daily.)  . insulin degludec (TRESIBA FLEXTOUCH) 100 UNIT/ML FlexTouch Pen Inject 13 Units into the skin daily.  . Insulin Pen Needle 32G X 4 MM MISC 1 each by Does not apply route 2 (two) times daily. Dx E11.9  . metFORMIN (GLUCOPHAGE) 500 MG tablet TAKE 2 TABLETS (1,000  MG TOTAL) BY MOUTH 2 (TWO) TIMES DAILY WITH A MEAL. (Patient taking differently: Take 1,000 mg by mouth 2 (two) times daily with a meal.)  . Olopatadine HCl 0.7 % SOLN Place 1 drop into both eyes daily.  . vitamin E 180 MG (400 UNITS) capsule Take 400 Units by mouth daily.    No facility-administered encounter medications on file as of 05/25/2021.   Recent Relevant Labs: Lab Results  Component Value Date/Time   HGBA1C 13.0 (H) 05/06/2021 05:41 AM   HGBA1C 12.3 (H) 01/25/2021 05:23 PM   MICROALBUR 30mg  03/22/2021 11:39 AM   MICROALBUR 30 06/04/2020 12:29 PM    Kidney Function Lab Results  Component Value Date/Time   CREATININE 1.75 (H) 05/06/2021 12:51 AM   CREATININE 1.89 (H) 05/05/2021 10:12 PM   CREATININE 0.99 11/16/2020 02:47 PM   CREATININE 1.16 (H) 07/27/2020 01:16 PM   CREATININE 1.0 11/23/2017 11:47 AM   CREATININE 1.0 07/23/2017 11:30 AM   CREATININE 1.0 03/22/2017 09:43 AM   CREATININE 1.2 (H) 08/24/2016 10:04 AM   GFRNONAA 34 (L) 05/06/2021 12:51 AM   GFRNONAA >60 11/16/2020 02:47 PM   GFRAA 59 (L) 01/25/2021 05:01 PM   GFRAA >60 07/27/2020 01:16 PM    . Current antihyperglycemic regimen:   Farxiga 5 mg taking 1 tablet by mouth daily in the morning prior to eating  Glipizide 5 mg tablet once per day   Tresiba 13 units into the skin daily  Metformin 500 mg tablet two times per day   Glucagon  0.5 MG/ 0.1 ML inject 0.1 ML subcutaneous as needed when blood sugar drops below 70.  . What recent interventions/DTPs have been made to improve glycemic control:  o Patient states she is taking medications as directed.  . Have there been any recent hospitalizations or ED visits since last visit with CPP? Yes   . Patient reports hypoglycemic symptoms, including dizziness  . Patient denies hyperglycemic symptoms  . How often are you checking your blood sugar? twice daily patient stated 3 times some days.  . What are your blood sugars ranging?  o Fasting:  102 o Before meals: none o After meals: 127 o Bedtime: 110, 50,58,210  . During the week, how often does your blood glucose drop below 70? Twice a week since last week.  . Are you checking your feet daily/regularly? Patient stated daily  Adherence Review: Is the patient currently on a STATIN medication? No  Is the patient currently on ACE/ARB medication? No Does the patient have >5 day gap between last estimated fill dates? No  NOTES: Patient states her sugar dropped to 50 and 58 one day last week causing her to feel dizzy. Patient stated she ate a can of green beans and her sugar went up to 110.  Star Rating Drugs: Glipizide 5 MG- Last filled 05-06-2021 [facility] Metformin 500 MG- Last filled 01-24-2021 [facility] Farxiga 5 MG- Last filled 05-20-2021 [facility]  Sherri [name] [facility], [facility], [facility] CMA Clinical Pharmacist  Assistant [phone]
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
## 2021-05-27 ENCOUNTER — Other Ambulatory Visit: Payer: Self-pay

## 2021-05-27 ENCOUNTER — Other Ambulatory Visit: Payer: Self-pay | Admitting: Nurse Practitioner

## 2021-05-27 DIAGNOSIS — E119 Type 2 diabetes mellitus without complications: Secondary | ICD-10-CM

## 2021-05-27 MED ORDER — INSULIN PEN NEEDLE 32G X 4 MM MISC: 1.0000 | Freq: Two times a day (BID) | 0 refills | Status: DC

## 2021-05-27 MED ORDER — GLUCOSE BLOOD VI STRP: ORAL_STRIP | 12 refills | Status: DC

## 2021-05-27 MED ORDER — TRESIBA FLEXTOUCH 100 UNIT/ML ~~LOC~~ SOPN: 13.0000 [IU] | PEN_INJECTOR | Freq: Every day | SUBCUTANEOUS | 0 refills | Status: AC

## 2021-06-01 ENCOUNTER — Ambulatory Visit: Payer: Medicare Other

## 2021-06-01 ENCOUNTER — Telehealth: Payer: Self-pay

## 2021-06-01 DIAGNOSIS — G2582 Stiff-man syndrome: Secondary | ICD-10-CM

## 2021-06-01 DIAGNOSIS — F32A Depression, unspecified: Secondary | ICD-10-CM

## 2021-06-01 DIAGNOSIS — E119 Type 2 diabetes mellitus without complications: Secondary | ICD-10-CM

## 2021-06-01 DIAGNOSIS — J45909 Unspecified asthma, uncomplicated: Secondary | ICD-10-CM

## 2021-06-01 NOTE — Patient Instructions (Signed)
Social Worker Visit Information  Goals we discussed today:  Goals Addressed            This Visit's Progress   . Home Safety Maintained       Timeframe:  Short-Term Goal Priority:  High Start Date:  6.8.22                           Expected End Date: 7.8.22                       Patient Goals/Self-Care Activities . patient will:   - [employer] as directed by [employer] Provided: Verbal education about resources provided by phone  Follow Up Plan: SW will follow up with patient by phone over the next two days   [name], BSW, CDP Social Worker, Certified Dementia Practitioner [facility] Care Management [phone]

## 2021-06-01 NOTE — Patient Instructions (Addendum)
Visit Information It was great speaking with you today!  Please let me know if you have any questions about our visit.  Goals Addressed            This Visit's Progress   . Manage My Medicine       Timeframe:  Long-Range Goal Priority:  High Start Date: 01/31/2021                        Expected End Date: 07/31/21                    Follow Up Date : 07/05/2021 - call for medicine refill 2 or 3 days before it runs out - call if I am sick and can't take my medicine - keep a list of all the medicines I take; vitamins and herbals too - use a pillbox to sort medicine - use an alarm clock or phone to remind me to take my medicine    Why is this important?   . These steps will help you keep on track with your medicines.             Patient agreed to services and verbal consent obtained.   The patient verbalized understanding of instructions, educational materials, and care plan provided today and agreed to receive a mailed copy of patient instructions, educational materials, and care plan.   Cherylin Mylar, PharmD Clinical Pharmacist [facility] [phone]

## 2021-06-01 NOTE — Chronic Care Management (AMB) (Signed)
Chronic Care Management    Social Work Note  06/01/2021 Name: Sherri [name] MRN: [id number] DOB: Jun 10, 1966  Sherri [name] is a 55 y.o. year old female who is a primary care patient of Arnette Felts, FNP. The CCM team was consulted to assist the patient with chronic disease management and/or care coordination needs related to: finacial insecurity.   Engaged with patient by telephone for follow up visit in response to provider referral for social work chronic care management and care coordination services.   Consent to Services:  The patient was given information about Chronic Care Management services, agreed to services, and gave verbal consent prior to initiation of services.  Please see initial visit note for detailed documentation.   Patient agreed to services and consent obtained.   Assessment: Review of patient past medical history, allergies, medications, and health status, including review of relevant consultants reports was performed today as part of a comprehensive evaluation and provision of chronic care management and care coordination services.     SDOH (Social Determinants of Health) assessments and interventions performed:    Advanced Directives Status: Not addressed in this encounter.  CCM Care Plan  Allergies  Allergen Reactions  . Ibuprofen Other (See Comments)    Does not take due to hx of renal insufficiency "I have kidney disease"   . Lemon Flavor Swelling    Severe Lip Swelling  FRUIT per pt.    . Amoxicillin Diarrhea and Other (See Comments)  . Tylenol [Acetaminophen] Hives    Cannot take large quantities    Outpatient Encounter Medications as of 06/01/2021  Medication Sig  . albuterol (VENTOLIN HFA) 108 (90 Base) MCG/ACT inhaler TAKE 2 PUFFS BY MOUTH EVERY 6 HOURS AS NEEDED FOR WHEEZE OR SHORTNESS OF BREATH (Patient taking differently: Inhale 2 puffs into the lungs every 6 (six) hours as needed for wheezing or shortness of breath.)  . Ascorbic Acid  (VITAMIN C) 1000 MG tablet Take 1,000 mg by mouth daily.  . baclofen (LIORESAL) 20 MG tablet TAKE 1 AND 1/2 TABLETS BY  MOUTH IN THE MORNING AND IN THE EVENING AND 1 TABLET AT MIDDAY (Patient taking differently: Take 20 mg by mouth 3 (three) times daily.)  . Cholecalciferol (VITAMIN D3) 10 MCG (400 UNIT) tablet Take 400 Units by mouth daily.  . Cranberry-Vitamin C-Vitamin E 4200-20-3 MG-MG-UNIT CAPS Take 1 tablet by mouth daily.  . cyanocobalamin (,VITAMIN B-12,) 1000 MCG/ML injection Inject 1,000 mcg into the muscle every 3 (three) months.  . dapagliflozin propanediol (FARXIGA) 5 MG TABS tablet Take 1 tablet (5 mg total) by mouth daily before breakfast.  . desloratadine (CLARINEX) 5 MG tablet TAKE 1 TABLET BY MOUTH  DAILY (Patient taking differently: Take 5 mg by mouth daily.)  . diazepam (VALIUM) 10 MG tablet TAKE 2 TABLETS BY MOUTH 3  TIMES DAILY as needed  . DIAZEPAM, 20 MG DOSE, NA Place into the nose.  [name] EPINEPHRINE 0.3 mg/0.3 mL IJ SOAJ injection INJECT CONTENTS OF 1 PEN AS NEEDED FOR ALLERGIC  RESPONSE AS DIRECTED BY MD. SEEK MEDICAL ATTENTION  AFTER USE. (Patient taking differently: Inject 0.3 mg into the muscle as needed for anaphylaxis.)  . famotidine (PEPCID) 20 MG tablet TAKE 1 TABLET BY MOUTH  TWICE DAILY  . FLUoxetine (PROZAC) 10 MG capsule TAKE 1 CAPSULE BY MOUTH  DAILY (Patient taking differently: Take 10 mg by mouth at bedtime.)  . gabapentin (NEURONTIN) 300 MG capsule TAKE 1 CAPSULE BY MOUTH TWO TIMES DAILY (Patient taking differently: Take 300 mg  by mouth 2 (two) times daily. TAKE 1 CAPSULE BY MOUTH TWO TIMES DAILY)  . glipiZIDE (GLUCOTROL) 5 MG tablet TAKE 1 TABLET BY MOUTH EVERY DAY AT LUNCH  . Glucagon (GVOKE HYPOPEN 2-PACK) 0.5 MG/0.1ML SOAJ Inject 0.1 mLs into the skin as needed (if blood sugar less than 70).  [name] glucose blood test strip Use as instructed to check sugars qac and qhs  . hydrOXYzine (VISTARIL) 100 MG capsule TAKE 1 CAPSULE BY MOUTH 3  TIMES DAILY AS NEEDED FOR   ITCHING (Patient taking differently: Take 100 mg by mouth 3 (three) times daily.)  . insulin degludec (TRESIBA FLEXTOUCH) 100 UNIT/ML FlexTouch Pen Inject 13 Units into the skin daily.  . Insulin Pen Needle 32G X 4 MM MISC 1 each by Does not apply route 2 (two) times daily. Dx E11.9  . metFORMIN (GLUCOPHAGE) 500 MG tablet TAKE 2 TABLETS (1,000 MG TOTAL) BY MOUTH 2 (TWO) TIMES DAILY WITH A MEAL. (Patient taking differently: Take 1,000 mg by mouth 2 (two) times daily with a meal.)  . Olopatadine HCl 0.7 % SOLN Place 1 drop into both eyes daily.  . vitamin E 180 MG (400 UNITS) capsule Take 400 Units by mouth daily.    No facility-administered encounter medications on file as of 06/01/2021.    Patient Active Problem List   Diagnosis Date Noted  . Hypoglycemia 05/05/2021  . AKI (acute kidney injury) (HCC) 05/05/2021  . Anxiety   . Chronic rhinitis 04/24/2019  . Keloid 10/10/2018  . Diabetes type 2, controlled (HCC) 10/10/2018  . Depression 01/02/2017  . History of DVT (deep vein thrombosis) 01/02/2017  . Chronic pain syndrome 01/02/2017  . Cocaine abuse (HCC) 01/02/2017  . Dysphagia   . Adjustment insomnia   . Distal radius fracture, right 08/16/2016  . Postconcussion syndrome 12/30/2014  . Tremor 03/11/2014  . 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 Hiatal hernia 03/21/2013  . Anemia, iron deficiency 01/29/2013  . Spasm of muscle 09/02/2012  . Anemia 08/28/2012  . Mild intermittent asthma 08/27/2012  . Chronic back pain   . Pernicious anemia 10/30/2011  . DEGENERATIVE DISC DISEASE 08/18/2010  . Asthma with bronchitis 07/28/2010  . Recurrent urticaria 07/28/2010    Conditions to be addressed/monitored: DM II, Asthma, Stiff Person Syndrome, and Depression; Financial Insecurity  Care Plan : Social Work [facility] Plan of Care  Updates made by [name] since 06/01/2021 12:00 AM    Problem: Housing Concerns Surrounding Possible Eviction     Goal: Home Safety Maintained   Start Date:  06/01/2021  Expected End Date: 07/01/2021  Priority: High  Note:   Current Barriers:  . Chronic disease management support and education needs related to  DM II, Asthma, Stiff Person Syndrome, and Depression   . Financial constraints related to costs of living . Difficulty managing money  Social Worker Clinical Goal(s):  . patient will work with SW to identify and address any acute and/or chronic care coordination needs related to the self health management of  DM II, Asthma, Stiff Person Syndrome, and Depression   . patient will follow up with [facility] as directed by SW  SW Interventions:  . Inter-disciplinary care team collaboration (see longitudinal plan of care) . Collaboration with Arnette Felts, FNP regarding development and update of comprehensive plan of care as evidenced by provider attestation and co-signature . Collaboration with Cherylin Mylar PharmD who indicates patient has disclosed she may be evicted on June 17 . Successful outbound call placed to the patient  in response to a voice message received . Discussed the patient has been unable to pay her rent this month and was told by her landlord if she does not pay it by June 17 she will be evicted . Determined the patients monthly rent is $877, patient currently only has $330 in her account until next month when she receives her next disability payment . Assessed for patient difficulty with managing funds - patient reports she was unable to pay her rent on time this month due to her car insurance being drafted from her account . Determined the patients car insurance is $185 per month . Discussed that even with that amount she would still be unable to meet her rent amount for the month . Discussed SW concern with patients ability to afford rent monthly - patient is unable to identify why she is down to $330 so soon after receiving disability check . Patient reports she is out of food and has not bought or picked  up food from previous resources provided by SW. [name] Patient states she is down 10 pounds due to food insecurity- Patient acknowledges she is able to go pick up food from resources provided by SW but has not  . Patient reports she has not picked up diabetic testing supplies or medications . Assessed for patient plans to pay for rent by the 17th - patient reports she has contacted DSS, [employer] representative, and [employer] all resources are out of funds . Discussed limited resource availability in the area to assist with financial insecurities . Advised the patient to contact [employer] to work with [employer] . Collaboration with colleague [employer] CSW to participate in a case discussion to review possible resources this SW may be overlooking . Discussed option to refer patient to the [facility] program through [facility] . Collaboration with Olean Ree, Care Guide to obtain more information on this resource . Unfortunately, the patient must live in [facility] or have a primary care provider in that county who is part of the [place] [facility] therefore patient does not qualify . Collaboration with Delsa Sale RN Care Manager to discuss current barriers the patient is facing related to housing and limited resources available to assist  . Collaboration with Cherylin Mylar PHarmD to provide an update regarding barriers and plan to assist with financial difficulty. Also advised PharmD patient has not picked up diabetic testing supplies or medications . SW will contact the patient over the next 2 days to determine if she has been served a legal notice regarding eviction  Patient Goals/Self-Care Activities . patient will:   -  [employer] as directed by [employer] Up Plan:  SW will follow up with the patient over the next 2 days       Follow Up Plan: SW will follow up with patient by phone over the next 2  days      [name], BSW, CDP Social Worker, Certified Dementia Practitioner [facility] Care Management [phone]  Total time spent performing care coordination and/or care management activities with the patient by phone or face to face = 100 minutes.

## 2021-06-02 ENCOUNTER — Ambulatory Visit: Payer: Medicare Other

## 2021-06-02 ENCOUNTER — Telehealth: Payer: Medicare Other

## 2021-06-02 ENCOUNTER — Ambulatory Visit: Payer: Self-pay

## 2021-06-02 DIAGNOSIS — E119 Type 2 diabetes mellitus without complications: Secondary | ICD-10-CM

## 2021-06-02 DIAGNOSIS — K219 Gastro-esophageal reflux disease without esophagitis: Secondary | ICD-10-CM

## 2021-06-02 DIAGNOSIS — F32A Depression, unspecified: Secondary | ICD-10-CM

## 2021-06-02 DIAGNOSIS — G2582 Stiff-man syndrome: Secondary | ICD-10-CM

## 2021-06-02 DIAGNOSIS — J45909 Unspecified asthma, uncomplicated: Secondary | ICD-10-CM
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
## 2021-06-02 NOTE — Patient Instructions (Signed)
Social Worker Visit Information  Goals we discussed today:   Goals Addressed             This Visit's Progress    Home Safety Maintained       Timeframe:  Short-Term Goal Priority:  High Start Date:  6.8.22                           Expected End Date: 7.8.22                       Patient Goals/Self-Care Activities patient will:   - Copy to request a rent extension and/or payment plan -Contact her sister to discuss possibility of patient staying with her          Materials Provided: Verbal education about eviction process provided by phone  Follow Up Plan: SW will follow up with patient by phone over the next 15 days   [name], BSW, CDP Social Worker, Certified Dementia Practitioner [facility] Care Management [phone]

## 2021-06-03 ENCOUNTER — Telehealth: Payer: Self-pay

## 2021-06-03 ENCOUNTER — Encounter: Payer: Self-pay | Admitting: *Deleted

## 2021-06-03 ENCOUNTER — Inpatient Hospital Stay: Payer: Medicare Other | Attending: Hematology & Oncology

## 2021-06-03 ENCOUNTER — Inpatient Hospital Stay: Payer: Medicare Other | Admitting: Family

## 2021-06-03 NOTE — Progress Notes (Signed)
No showed apt this am. Message to scheduling.

## 2021-06-06 NOTE — Chronic Care Management (AMB) (Signed)
Chronic Care Management   CCM RN Visit Note  05/18/2021 Name: Temia Debroux MRN: [id number] DOB: 1966-06-13  Subjective: Blanka Rockholt is a 55 y.o. year old female who is a primary care patient of Arnette Felts, FNP. The care management team was consulted for assistance with disease management and care coordination needs.    Engaged with patient by telephone for follow up visit in response to provider referral for case management and/or care coordination services.   Consent to Services:  The patient was given information about Chronic Care Management services, agreed to services, and gave verbal consent prior to initiation of services.  Please see initial visit note for detailed documentation.   Patient agreed to services and verbal consent obtained.   Assessment: Review of patient past medical history, allergies, medications, health status, including review of consultants reports, laboratory and other test data, was performed as part of comprehensive evaluation and provision of chronic care management services.   SDOH (Social Determinants of Health) assessments and interventions performed:  Yes, patient already engaged with BSW   CCM Care Plan  Allergies  Allergen Reactions   Ibuprofen Other (See Comments)    Does not take due to hx of renal insufficiency "I have kidney disease"    Lemon Flavor Swelling    Severe Lip Swelling  FRUIT per pt.     Amoxicillin Diarrhea and Other (See Comments)   Tylenol [Acetaminophen] Hives    Cannot take large quantities    Outpatient Encounter Medications as of 05/18/2021  Medication Sig   albuterol (VENTOLIN HFA) 108 (90 Base) MCG/ACT inhaler TAKE 2 PUFFS BY MOUTH EVERY 6 HOURS AS NEEDED FOR WHEEZE OR SHORTNESS OF BREATH (Patient taking differently: Inhale 2 puffs into the lungs every 6 (six) hours as needed for wheezing or shortness of breath.)   Ascorbic Acid (VITAMIN C) 1000 MG tablet Take 1,000 mg by mouth daily.   baclofen (LIORESAL)  20 MG tablet TAKE 1 AND 1/2 TABLETS BY  MOUTH IN THE MORNING AND IN THE EVENING AND 1 TABLET AT MIDDAY (Patient taking differently: Take 20 mg by mouth 3 (three) times daily.)   Cholecalciferol (VITAMIN D3) 10 MCG (400 UNIT) tablet Take 400 Units by mouth daily.   Cranberry-Vitamin C-Vitamin E 4200-20-3 MG-MG-UNIT CAPS Take 1 tablet by mouth daily.   cyanocobalamin (,VITAMIN B-12,) 1000 MCG/ML injection Inject 1,000 mcg into the muscle every 3 (three) months.   dapagliflozin propanediol (FARXIGA) 5 MG TABS tablet Take 1 tablet (5 mg total) by mouth daily before breakfast.   desloratadine (CLARINEX) 5 MG tablet TAKE 1 TABLET BY MOUTH  DAILY (Patient taking differently: Take 5 mg by mouth daily.)   diazepam (VALIUM) 10 MG tablet TAKE 2 TABLETS BY MOUTH 3  TIMES DAILY as needed   DIAZEPAM, 20 MG DOSE, NA Place into the nose.   EPINEPHRINE 0.3 mg/0.3 mL IJ SOAJ injection INJECT CONTENTS OF 1 PEN AS NEEDED FOR ALLERGIC  RESPONSE AS DIRECTED BY MD. SEEK MEDICAL ATTENTION  AFTER USE. (Patient taking differently: Inject 0.3 mg into the muscle as needed for anaphylaxis.)   famotidine (PEPCID) 20 MG tablet TAKE 1 TABLET BY MOUTH  TWICE DAILY   FLUoxetine (PROZAC) 10 MG capsule TAKE 1 CAPSULE BY MOUTH  DAILY (Patient taking differently: Take 10 mg by mouth at bedtime.)   gabapentin (NEURONTIN) 300 MG capsule TAKE 1 CAPSULE BY MOUTH TWO TIMES DAILY (Patient taking differently: Take 300 mg by mouth 2 (two) times daily. TAKE 1 CAPSULE BY MOUTH TWO TIMES  DAILY)   glipiZIDE (GLUCOTROL) 5 MG tablet TAKE 1 TABLET BY MOUTH EVERY DAY AT LUNCH   hydrOXYzine (VISTARIL) 100 MG capsule TAKE 1 CAPSULE BY MOUTH 3  TIMES DAILY AS NEEDED FOR  ITCHING (Patient taking differently: Take 100 mg by mouth 3 (three) times daily.)   metFORMIN (GLUCOPHAGE) 500 MG tablet TAKE 2 TABLETS (1,000 MG TOTAL) BY MOUTH 2 (TWO) TIMES DAILY WITH A MEAL. (Patient taking differently: Take 1,000 mg by mouth 2 (two) times daily with a meal.)    Olopatadine HCl 0.7 % SOLN Place 1 drop into both eyes daily.   vitamin E 180 MG (400 UNITS) capsule Take 400 Units by mouth daily.    [DISCONTINUED] glucose blood test strip Use as instructed to check sugars qac and qhs   [DISCONTINUED] insulin degludec (TRESIBA FLEXTOUCH) 100 UNIT/ML FlexTouch Pen Inject 15 Units into the skin daily.   [DISCONTINUED] Insulin Pen Needle 32G X 4 MM MISC 1 each by Does not apply route 2 (two) times daily. Dx E11.9   No facility-administered encounter medications on file as of 05/18/2021.    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   Hiatal hernia 03/21/2013   Anemia, iron deficiency 01/29/2013   Spasm of muscle 09/02/2012   Anemia 08/28/2012   Mild intermittent asthma 08/27/2012   Chronic back pain    Pernicious anemia 10/30/2011   DEGENERATIVE DISC DISEASE 08/18/2010   Asthma with bronchitis 07/28/2010   Recurrent urticaria 07/28/2010    Conditions to be addressed/monitored: DMII, Stiff-Person Syndrome, Asthma with bronchitis, GERD, Depression  Care Plan : Diabetes Type 2 (Adult)  Updates made by [name], RN since 05/18/2021 12:00 AM     Problem: Glycemic Management (Diabetes, Type 2)   Priority: High     Long-Range Goal: Glycemic Management Optimized   Start Date: 01/31/2021  Expected End Date: 01/31/2022  Recent Progress: On track  Priority: High  Note:   Current Barriers:  Knowledge Deficits related to disease process and Self  Health management of Asthma Chronic Disease Management support and education needs related to Diabetes, Asthma, Stiff-Person Syndrome Financial difficulty buying food  Relationship strain with boyfriend  Nurse Case Manager Clinical Goal(s):  Patient will work with the CCM team and PCP to address needs related to disease education and support for improved Self Health management of Diabetes as evidence by patient will lower her A1c to 7.5 or below  CCM RN CM Interventions:  05/18/21 completed successful inbound call from patient  Collaboration with Dorothyann Peng, MD regarding development and update of comprehensive plan of care as evidenced by provider attestation and co-signature Inter-disciplinary care team collaboration (see longitudinal plan of care) Determined patient is adhering to monitoring her cbg's at home before meals and at bedtime, before and after exercise, she is experiencing some low's  Determined she is adhering to her newly prescribed medication regimen, although she is holding the am dose of Tresiba when sugars are low Discussed patient received  the following message from PCP, "Called her to make her aware I sent her GVOKE for a low blood sugar less than 70, she states was as low as 50 earlier today, she had to eat pancakes and chocolate kisses. Her blood sugars have been ranging 50-198 and one 200. She is taking 15 units of tresiba. I have advised her to decrease her tresiba to 13 units daily." Sent secure message to PCP regarding new Rx for Tresiba 13 units daily, 30 day supply, and [employer] test strips are needed Sent urgent referral to embedded Pharm D regarding financial assistance is needed to help patient lower drug cost for several medications and to review guidelines for indication/dosage and frequency of Tresiba per patient's request Reviewed scheduled/upcoming provider appointments including: next PCP follow up appointment scheduled for 06/08/21 @3 :00 PM   Discussed plans with patient for ongoing care management follow up and provided patient with direct contact information for care management team Patient Self Care Activities:  Follow up with Endocrinology as directed/referred by PCP Follow with the Nutritionist once referred by PCP for meal planning  Continue to follow DM dietary and exercise recommendations Continue to monitor CBG's daily before meals and at bedtime, alerting CCM team and or PCP of abnormal results  Work with the embedded Pharm D for evaluation of current treatment of DM and consideration for treatment change Pick up Farxiga samples from PCP office and start this medication as directed Increase your water intake and strictly adhere to low carb diet, especially while BS are elevated Take all of your DM medications exactly as prescribed   Follow Up Plan: Telephone follow up appointment with care management team member scheduled for: 06/10/21    Plan:Telephone follow up appointment with care management team member scheduled for:  06/10/21  06/12/21, RN, BSN, CCM Care Management Coordinator [facility]  Direct Phone: [phone]

## 2021-06-06 NOTE — Patient Instructions (Signed)
Goals Addressed      Diabetes treatment optimized   On track    Timeframe:  Long-Range Goal Priority:  High Start Date: 01/31/21/                            Expected End Date:  01/31/22  Follow Up Date: 06/10/21   Continue to follow DM dietary and exercise recommendations Continue to monitor CBG's daily before meals and at bedtime, alerting CCM team and or PCP of abnormal results  Work with the embedded Pharm D for evaluation of current treatment of DM and consideration for treatment change Follow up with Endocrinology as directed/referred by PCP  Pick up Farxiga samples from PCP office and start this medication as directed Follow up with the Nutritionist once referred by PCP  Increase your water intake and strictly adhere to low carb diet, especially while BS are elevated Take all of your DM medications exactly as prescribed

## 2021-06-07 ENCOUNTER — Ambulatory Visit: Payer: Medicare Other

## 2021-06-07 ENCOUNTER — Ambulatory Visit: Payer: Medicare Other | Admitting: Family

## 2021-06-07 ENCOUNTER — Telehealth: Payer: Self-pay

## 2021-06-07 ENCOUNTER — Other Ambulatory Visit: Payer: Medicare Other

## 2021-06-08 ENCOUNTER — Ambulatory Visit: Payer: Medicare Other | Admitting: Nurse Practitioner

## 2021-06-08 ENCOUNTER — Telehealth: Payer: Medicare Other

## 2021-06-08 ENCOUNTER — Ambulatory Visit: Payer: Self-pay

## 2021-06-08 ENCOUNTER — Telehealth: Payer: Self-pay

## 2021-06-08 DIAGNOSIS — E119 Type 2 diabetes mellitus without complications: Secondary | ICD-10-CM

## 2021-06-08 DIAGNOSIS — F32A Depression, unspecified: Secondary | ICD-10-CM

## 2021-06-08 DIAGNOSIS — G2582 Stiff-man syndrome: Secondary | ICD-10-CM

## 2021-06-08 DIAGNOSIS — K219 Gastro-esophageal reflux disease without esophagitis: Secondary | ICD-10-CM

## 2021-06-08 DIAGNOSIS — J45909 Unspecified asthma, uncomplicated: Secondary | ICD-10-CM

## 2021-06-08 NOTE — Telephone Encounter (Signed)
This nurse called patient in regards to missed AWV for today. Message left to call back in order to reschedule for another time. 

## 2021-06-08 NOTE — Patient Instructions (Signed)
Goals Addressed      Diabetes treatment optimized   On track    Timeframe:  Long-Range Goal Priority:  High Start Date: 01/31/21/                            Expected End Date:  01/31/22  Follow Up Date: 07/06/21  Continue to follow DM dietary and exercise recommendations Continue to monitor CBG's daily before meals and at bedtime, alerting CCM team and or PCP of abnormal results  Work with the embedded Pharm D for evaluation of current treatment of DM and consideration for treatment change Follow up with Endocrinology as directed/referred by PCP  Pick up Farxiga samples from PCP office and start this medication as directed Follow up with the Nutritionist once referred by PCP  Increase your water intake and strictly adhere to low carb diet, especially while BS are elevated Take all of your DM medications exactly as prescribed                            Home Safety Maintained   Not on track    Timeframe:  Short-Term Goal Priority:  High Start Date:  6.8.22                           Expected End Date: 7.8.22                       Patient Goals/Self-Care Activities patient will:   - Copy to request a rent extension and/or payment plan -Contact her sister to discuss possibility of patient staying with her
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
## 2021-06-10 ENCOUNTER — Telehealth: Payer: Medicare Other

## 2021-06-13 ENCOUNTER — Ambulatory Visit: Payer: Medicare Other

## 2021-06-13 DIAGNOSIS — F32A Depression, unspecified: Secondary | ICD-10-CM

## 2021-06-13 DIAGNOSIS — J45909 Unspecified asthma, uncomplicated: Secondary | ICD-10-CM

## 2021-06-13 DIAGNOSIS — E119 Type 2 diabetes mellitus without complications: Secondary | ICD-10-CM

## 2021-06-13 NOTE — Chronic Care Management (AMB) (Signed)
Chronic Care Management    Social Work Note  06/13/2021 Name: Sherri [name] MRN: [id number] DOB: 10-Dec-1966  Sherri [name] is a 55 y.o. year old female who is a primary care patient of Sherri Felts, FNP. The CCM team was consulted to assist the patient with chronic disease management and/or care coordination needs related to: [employer] .   Engaged with patient by telephone for follow up visit in response to provider referral for social work chronic care management and care coordination services.   Consent to Services:  The patient was given information about Chronic Care Management services, agreed to services, and gave verbal consent prior to initiation of services.  Please see initial visit note for detailed documentation.   Patient agreed to services and consent obtained.   Assessment: Review of patient past medical history, allergies, medications, and health status, including review of relevant consultants reports was performed today as part of a comprehensive evaluation and provision of chronic care management and care coordination services.     SDOH (Social Determinants of Health) assessments and interventions performed:    Advanced Directives Status: Not addressed in this encounter.  CCM Care Plan  Allergies  Allergen Reactions   Ibuprofen Other (See Comments)    Does not take due to hx of renal insufficiency "I have kidney disease"    Lemon Flavor Swelling    Severe Lip Swelling  FRUIT per pt.     Amoxicillin Diarrhea and Other (See Comments)   Tylenol [Acetaminophen] Hives    Cannot take large quantities    Outpatient Encounter Medications as of 06/13/2021  Medication Sig   albuterol (VENTOLIN HFA) 108 (90 Base) MCG/ACT inhaler TAKE 2 PUFFS BY MOUTH EVERY 6 HOURS AS NEEDED FOR WHEEZE OR SHORTNESS OF BREATH (Patient taking differently: Inhale 2 puffs into the lungs every 6 (six) hours as needed for wheezing or shortness of breath.)   Ascorbic Acid (VITAMIN  C) 1000 MG tablet Take 1,000 mg by mouth daily.   baclofen (LIORESAL) 20 MG tablet TAKE 1 AND 1/2 TABLETS BY  MOUTH IN THE MORNING AND IN THE EVENING AND 1 TABLET AT MIDDAY (Patient taking differently: Take 20 mg by mouth 3 (three) times daily.)   Cholecalciferol (VITAMIN D3) 10 MCG (400 UNIT) tablet Take 400 Units by mouth daily.   Cranberry-Vitamin C-Vitamin E 4200-20-3 MG-MG-UNIT CAPS Take 1 tablet by mouth daily.   cyanocobalamin (,VITAMIN B-12,) 1000 MCG/ML injection Inject 1,000 mcg into the muscle every 3 (three) months.   dapagliflozin propanediol (FARXIGA) 5 MG TABS tablet Take 1 tablet (5 mg total) by mouth daily before breakfast.   desloratadine (CLARINEX) 5 MG tablet TAKE 1 TABLET BY MOUTH  DAILY (Patient taking differently: Take 5 mg by mouth daily.)   diazepam (VALIUM) 10 MG tablet TAKE 2 TABLETS BY MOUTH 3  TIMES DAILY as needed   DIAZEPAM, 20 MG DOSE, NA Place into the nose.   EPINEPHRINE 0.3 mg/0.3 mL IJ SOAJ injection INJECT CONTENTS OF 1 PEN AS NEEDED FOR ALLERGIC  RESPONSE AS DIRECTED BY MD. SEEK MEDICAL ATTENTION  AFTER USE. (Patient taking differently: Inject 0.3 mg into the muscle as needed for anaphylaxis.)   famotidine (PEPCID) 20 MG tablet TAKE 1 TABLET BY MOUTH  TWICE DAILY   FLUoxetine (PROZAC) 10 MG capsule TAKE 1 CAPSULE BY MOUTH  DAILY (Patient taking differently: Take 10 mg by mouth at bedtime.)   gabapentin (NEURONTIN) 300 MG capsule TAKE 1 CAPSULE BY MOUTH TWO TIMES DAILY (Patient taking differently: Take 300  mg by mouth 2 (two) times daily. TAKE 1 CAPSULE BY MOUTH TWO TIMES DAILY)   glipiZIDE (GLUCOTROL) 5 MG tablet TAKE 1 TABLET BY MOUTH EVERY DAY AT LUNCH   Glucagon (GVOKE HYPOPEN 2-PACK) 0.5 MG/0.1ML SOAJ Inject 0.1 mLs into the skin as needed (if blood sugar less than 70).   glucose blood test strip Use as instructed to check sugars qac and qhs   hydrOXYzine (VISTARIL) 100 MG capsule TAKE 1 CAPSULE BY MOUTH 3  TIMES DAILY AS NEEDED FOR  ITCHING (Patient taking  differently: Take 100 mg by mouth 3 (three) times daily.)   insulin degludec (TRESIBA FLEXTOUCH) 100 UNIT/ML FlexTouch Pen Inject 13 Units into the skin daily.   Insulin Pen Needle 32G X 4 MM MISC 1 each by Does not apply route 2 (two) times daily. Dx E11.9   metFORMIN (GLUCOPHAGE) 500 MG tablet TAKE 2 TABLETS (1,000 MG TOTAL) BY MOUTH 2 (TWO) TIMES DAILY WITH A MEAL. (Patient taking differently: Take 1,000 mg by mouth 2 (two) times daily with a meal.)   Olopatadine HCl 0.7 % SOLN Place 1 drop into both eyes daily.   vitamin E 180 MG (400 UNITS) capsule Take 400 Units by mouth daily.    No facility-administered encounter medications on file as of 06/13/2021.    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   Hiatal hernia 03/21/2013   Anemia, iron deficiency 01/29/2013   Spasm of muscle 09/02/2012   Anemia 08/28/2012   Mild intermittent asthma 08/27/2012   Chronic back pain    Pernicious anemia 10/30/2011   DEGENERATIVE DISC DISEASE 08/18/2010   Asthma with bronchitis 07/28/2010   Recurrent urticaria 07/28/2010    Conditions to be addressed/monitored: DMII, Depression, and Asthma ; Housing barriers  Care Plan : Social Work [facility] Plan of Care  Updates made by [name] since 06/13/2021 12:00 AM     Problem: Housing Concerns Surrounding Possible Eviction      Goal: Home Safety Maintained   Start Date: 06/01/2021  Expected End Date: 07/01/2021  Priority: High  Note:   Current  Barriers:  Chronic disease management support and education needs related to  DM II, Asthma, Stiff Person Syndrome, and Depression   Financial constraints related to costs of living Difficulty managing money  Social Worker Clinical Goal(s):  patient will work with SW to identify and address any acute and/or chronic care coordination needs related to the self health management of  DM II, Asthma, Stiff Person Syndrome, and Depression   patient will follow up with [employer] as directed by SW  SW Interventions:  Inter-disciplinary care team collaboration (see longitudinal plan of care) Collaboration with Sherri Felts, FNP regarding development and update of comprehensive plan of care as evidenced by provider attestation and co-signature Successful outbound call placed to the patient to assess status of housing barriers Determined the patient has received an extension to pay rent for the month of June. Patient reports she will have  to pay both June and July rent on July 1 Discussed concern with financial burden to pay both months rent from one social security check- patient does make enough to cover this cost but would not have much left over for the remainder of the month Patient reports her power bill of $56.94 is due today and she does not have the funds to pay the bill Patient plans to visit [facility] to request financial assistance Determined the patient does not have a disconnect notice at this time. The patient is set up on a payment plan, if she does not pay the amount due today her amount due will increase to $107 Encouraged the patient to visit [facility] [facility] this morning to request assistance Scheduled follow up call over the next 21 days Patient Goals/Self-Care Activities patient will:   Arts administrator to request a rent extension and/or payment plan -Visit [employer] to request assistance with utility costs  Follow Up Plan:  SW will follow up  with the patient over the next 21 days       Follow Up Plan: SW will follow up with patient by phone over the next 21 days      [name], BSW, CDP Social Worker, Certified Dementia Practitioner [facility] Care Management [phone]  Total time spent performing care coordination and/or care management activities with the patient by phone or face to face = 20 minutes.

## 2021-06-13 NOTE — Patient Instructions (Signed)
Social Worker Visit Information  Goals we discussed today:   Goals Addressed             This Visit's Progress    Home Safety Maintained       Timeframe:  Short-Term Goal Priority:  High Start Date:  6.8.22                           Expected End Date: 7.8.22                       Patient Goals/Self-Care Activities patient will:   -  Copy to request a rent extension and/or payment plan -Visit [employer] to request assistance with utility costs          Materials Provided: Verbal education about Holiday representative provided by phone  Follow Up Plan: SW will follow up with patient by phone over the next 21 days   [name], Kenard Gower, CDP Social Worker, Certified Dementia Practitioner [facility] Care Management [phone]

## 2021-06-14 ENCOUNTER — Ambulatory Visit: Payer: Medicare Other

## 2021-06-14 DIAGNOSIS — E119 Type 2 diabetes mellitus without complications: Secondary | ICD-10-CM

## 2021-06-14 DIAGNOSIS — J45909 Unspecified asthma, uncomplicated: Secondary | ICD-10-CM

## 2021-06-14 DIAGNOSIS — F32A Depression, unspecified: Secondary | ICD-10-CM | POA: Diagnosis not present

## 2021-06-14 DIAGNOSIS — E118 Type 2 diabetes mellitus with unspecified complications: Secondary | ICD-10-CM | POA: Diagnosis not present

## 2021-06-14 DIAGNOSIS — J4520 Mild intermittent asthma, uncomplicated: Secondary | ICD-10-CM | POA: Diagnosis not present

## 2021-06-14 NOTE — Patient Instructions (Signed)
Social Worker Visit Information  Goals we discussed today:   Goals Addressed             This Visit's Progress    Home Safety Maintained       Timeframe:  Short-Term Goal Priority:  High Start Date:  6.8.22                           Expected End Date: 7.8.22        Next planned outreach: 7.6.22                 Patient Goals/Self-Care Activities patient will:   -  Copy to request a rent extension and/or payment plan -Visit [employer] to request assistance with utility costs -Contact Social Security regarding possibility of obtaining a part time job          Follow Up Plan: SW will follow up with patient by phone over the next 21 days  [name], Kenard Gower, CDP Social Worker, Certified Dementia Practitioner [facility] Care Management [phone]

## 2021-06-14 NOTE — Chronic Care Management (AMB) (Signed)
Chronic Care Management    Social Work Note  06/14/2021 Name: Sherri [name] MRN: [id number] DOB: 1966/06/23  Sherri [name] is a 55 y.o. year old female who is a primary care patient of Arnette Felts, FNP. The CCM team was consulted to assist the patient with chronic disease management and/or care coordination needs related to: [employer] .   Engaged with patient by telephone for follow up visit in response to provider referral for social work chronic care management and care coordination services.   Consent to Services:  The patient was given information about Chronic Care Management services, agreed to services, and gave verbal consent prior to initiation of services.  Please see initial visit note for detailed documentation.   Patient agreed to services and consent obtained.   Assessment: Review of patient past medical history, allergies, medications, and health status, including review of relevant consultants reports was performed today as part of a comprehensive evaluation and provision of chronic care management and care coordination services.     SDOH (Social Determinants of Health) assessments and interventions performed:    Advanced Directives Status: Not addressed in this encounter.  CCM Care Plan  Allergies  Allergen Reactions   Ibuprofen Other (See Comments)    Does not take due to hx of renal insufficiency "I have kidney disease"    Lemon Flavor Swelling    Severe Lip Swelling  FRUIT per pt.     Amoxicillin Diarrhea and Other (See Comments)   Tylenol [Acetaminophen] Hives    Cannot take large quantities    Outpatient Encounter Medications as of 06/14/2021  Medication Sig   albuterol (VENTOLIN HFA) 108 (90 Base) MCG/ACT inhaler TAKE 2 PUFFS BY MOUTH EVERY 6 HOURS AS NEEDED FOR WHEEZE OR SHORTNESS OF BREATH (Patient taking differently: Inhale 2 puffs into the lungs every 6 (six) hours as needed for wheezing or shortness of breath.)   Ascorbic Acid (VITAMIN  C) 1000 MG tablet Take 1,000 mg by mouth daily.   baclofen (LIORESAL) 20 MG tablet TAKE 1 AND 1/2 TABLETS BY  MOUTH IN THE MORNING AND IN THE EVENING AND 1 TABLET AT MIDDAY (Patient taking differently: Take 20 mg by mouth 3 (three) times daily.)   Cholecalciferol (VITAMIN D3) 10 MCG (400 UNIT) tablet Take 400 Units by mouth daily.   Cranberry-Vitamin C-Vitamin E 4200-20-3 MG-MG-UNIT CAPS Take 1 tablet by mouth daily.   cyanocobalamin (,VITAMIN B-12,) 1000 MCG/ML injection Inject 1,000 mcg into the muscle every 3 (three) months.   dapagliflozin propanediol (FARXIGA) 5 MG TABS tablet Take 1 tablet (5 mg total) by mouth daily before breakfast.   desloratadine (CLARINEX) 5 MG tablet TAKE 1 TABLET BY MOUTH  DAILY (Patient taking differently: Take 5 mg by mouth daily.)   diazepam (VALIUM) 10 MG tablet TAKE 2 TABLETS BY MOUTH 3  TIMES DAILY as needed   DIAZEPAM, 20 MG DOSE, NA Place into the nose.   EPINEPHRINE 0.3 mg/0.3 mL IJ SOAJ injection INJECT CONTENTS OF 1 PEN AS NEEDED FOR ALLERGIC  RESPONSE AS DIRECTED BY MD. SEEK MEDICAL ATTENTION  AFTER USE. (Patient taking differently: Inject 0.3 mg into the muscle as needed for anaphylaxis.)   famotidine (PEPCID) 20 MG tablet TAKE 1 TABLET BY MOUTH  TWICE DAILY   FLUoxetine (PROZAC) 10 MG capsule TAKE 1 CAPSULE BY MOUTH  DAILY (Patient taking differently: Take 10 mg by mouth at bedtime.)   gabapentin (NEURONTIN) 300 MG capsule TAKE 1 CAPSULE BY MOUTH TWO TIMES DAILY (Patient taking differently: Take 300  mg by mouth 2 (two) times daily. TAKE 1 CAPSULE BY MOUTH TWO TIMES DAILY)   glipiZIDE (GLUCOTROL) 5 MG tablet TAKE 1 TABLET BY MOUTH EVERY DAY AT LUNCH   Glucagon (GVOKE HYPOPEN 2-PACK) 0.5 MG/0.1ML SOAJ Inject 0.1 mLs into the skin as needed (if blood sugar less than 70).   glucose blood test strip Use as instructed to check sugars qac and qhs   hydrOXYzine (VISTARIL) 100 MG capsule TAKE 1 CAPSULE BY MOUTH 3  TIMES DAILY AS NEEDED FOR  ITCHING (Patient taking  differently: Take 100 mg by mouth 3 (three) times daily.)   insulin degludec (TRESIBA FLEXTOUCH) 100 UNIT/ML FlexTouch Pen Inject 13 Units into the skin daily.   Insulin Pen Needle 32G X 4 MM MISC 1 each by Does not apply route 2 (two) times daily. Dx E11.9   metFORMIN (GLUCOPHAGE) 500 MG tablet TAKE 2 TABLETS (1,000 MG TOTAL) BY MOUTH 2 (TWO) TIMES DAILY WITH A MEAL. (Patient taking differently: Take 1,000 mg by mouth 2 (two) times daily with a meal.)   Olopatadine HCl 0.7 % SOLN Place 1 drop into both eyes daily.   vitamin E 180 MG (400 UNITS) capsule Take 400 Units by mouth daily.    No facility-administered encounter medications on file as of 06/14/2021.    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   Hiatal hernia 03/21/2013   Anemia, iron deficiency 01/29/2013   Spasm of muscle 09/02/2012   Anemia 08/28/2012   Mild intermittent asthma 08/27/2012   Chronic back pain    Pernicious anemia 10/30/2011   DEGENERATIVE DISC DISEASE 08/18/2010   Asthma with bronchitis 07/28/2010   Recurrent urticaria 07/28/2010    Conditions to be addressed/monitored: DMII, Depression, and Asthma ; Financial constraints related to costs of living and Limited access to food  Care Plan : Social Work [facility] Plan of Care  Updates made by [name] since 06/14/2021 12:00 AM     Problem: Housing Concerns Surrounding Possible Eviction      Goal: Home Safety Maintained   Start Date: 06/01/2021  Expected  End Date: 07/01/2021  Priority: High  Note:   Current Barriers:  Chronic disease management support and education needs related to  DM II, Asthma, Stiff Person Syndrome, and Depression   Financial constraints related to costs of living Difficulty managing money  Social Worker Clinical Goal(s):  patient will work with SW to identify and address any acute and/or chronic care coordination needs related to the self health management of  DM II, Asthma, Stiff Person Syndrome, and Depression   patient will follow up with [employer] as directed by [employer]  SW Interventions:  Inter-disciplinary care team collaboration (see longitudinal plan of care) Collaboration with Arnette Felts, FNP regarding development and update of comprehensive plan of care as evidenced by provider attestation and co-signature Successful outbound call placed to the patient in response to a voice message received Discussed the patient is feeling isolated and lonely lately  Reviewed opportunity for patient to spend her time volunteering- patient reports she may be interested in getting a part time job as long as it does not interfere with disability income Determined the patient has contacted social security in the past to verify income restrictions and will plan to contact them again prior to seeking out a part time job Encouraged the patient to visit with local family including her sister and daughter to combat loneliness Discussed the patient did not visit [employer] Monday 6.20.22 due to the agency being closed for a federal holiday Encouraged the patient to visit [employer] sometime this week to determine if she can receive assistance with utility bill Assessed for food insecurity - patient reports Renato Gails Diane delivered food to her over the weekened Patient Goals/Self-Care Activities patient will:   Arts administrator to request a rent extension and/or payment plan -Estate agent to  request assistance with utility costs -Banker regarding possibility of obtaining a part time job  Follow Up Plan:  SW will follow up with the patient over the next 21 days       Follow Up Plan: SW will follow up with patient by phone over the next 21 days      [name], Kenard Gower, CDP Social Worker, Certified Dementia Practitioner [facility] Care Management [phone]  Total time spent performing care coordination and/or care management activities with the patient by phone or face to face = 35 minutes.

## 2021-06-21 ENCOUNTER — Encounter: Payer: Self-pay | Admitting: Neurology

## 2021-06-21 ENCOUNTER — Ambulatory Visit (INDEPENDENT_AMBULATORY_CARE_PROVIDER_SITE_OTHER): Payer: Medicare Other | Admitting: Neurology

## 2021-06-21 ENCOUNTER — Other Ambulatory Visit: Payer: Self-pay

## 2021-06-21 VITALS — BP 92/62 | HR 77 | Ht 63.0 in | Wt 92.0 lb

## 2021-06-21 DIAGNOSIS — G2582 Stiff-man syndrome: Secondary | ICD-10-CM | POA: Diagnosis not present

## 2021-06-21 DIAGNOSIS — F32A Depression, unspecified: Secondary | ICD-10-CM

## 2021-06-21 MED ORDER — BACLOFEN 20 MG PO TABS: ORAL_TABLET | ORAL | 1 refills | Status: DC

## 2021-06-21 MED ORDER — GABAPENTIN 300 MG PO CAPS: ORAL_CAPSULE | ORAL | 1 refills | Status: DC

## 2021-06-21 NOTE — Patient Instructions (Signed)
Try to decrease Valium to 20 mg in AM, 10 mg midday, 20 mg evening Will keep other medications Watch out for falls See you back in 6 months
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
## 2021-06-21 NOTE — Progress Notes (Signed)
I have read the note, and I agree with the clinical assessment and plan.  Joseeduardo Brix K Elloise Roark   

## 2021-06-22 ENCOUNTER — Ambulatory Visit: Payer: Medicare Other | Admitting: Obstetrics

## 2021-06-22 NOTE — Telephone Encounter (Signed)
Patient called with concerns of housing issues. I sent information over to CCM SW and the rest of the chronic care team.  Cherylin Mylar, PharmD Clinical Pharmacist [facility] [phone]

## 2021-06-29 ENCOUNTER — Ambulatory Visit (INDEPENDENT_AMBULATORY_CARE_PROVIDER_SITE_OTHER): Payer: Medicare Other

## 2021-06-29 ENCOUNTER — Ambulatory Visit: Payer: Medicare Other | Admitting: Obstetrics

## 2021-06-29 DIAGNOSIS — E119 Type 2 diabetes mellitus without complications: Secondary | ICD-10-CM

## 2021-06-29 DIAGNOSIS — G2582 Stiff-man syndrome: Secondary | ICD-10-CM

## 2021-06-29 DIAGNOSIS — F32A Depression, unspecified: Secondary | ICD-10-CM

## 2021-06-29 DIAGNOSIS — J45909 Unspecified asthma, uncomplicated: Secondary | ICD-10-CM

## 2021-06-29 NOTE — Patient Instructions (Signed)
Social Worker Visit Information  Goals we discussed today:   Goals Addressed             This Visit's Progress    Home Safety Maintained       Timeframe:  Short-Term Goal Priority:  High Start Date:  6.8.22                           Expected End Date: 7.8.22        Next planned outreach: 8.3.22                 Patient Goals/Self-Care Activities patient will:   - Contact food pantry to request an emergency food box -Contact family members to request assistance with food insecurity -Contact Social Security regarding possibility of obtaining a part time job          Follow Up Plan: SW will follow up with patient by phone over the next month   [name], BSW, CDP Social Worker, Certified Dementia Practitioner [facility] Care Management [phone]

## 2021-06-29 NOTE — Chronic Care Management (AMB) (Signed)
Chronic Care Management    Social Work Note  06/29/2021 Name: Sherri [name] MRN: [id number] DOB: 14-Dec-1966  Sherri [name] is a 55 y.o. year old female who is a primary care patient of Arnette Felts, FNP. The CCM team was consulted to assist the patient with chronic disease management and/or care coordination needs related to: [employer]  and [employer].   Engaged with patient by telephone for follow up visit in response to provider referral for social work chronic care management and care coordination services.   Consent to Services:  The patient was given information about Chronic Care Management services, agreed to services, and gave verbal consent prior to initiation of services.  Please see initial visit note for detailed documentation.   Patient agreed to services and consent obtained.   Assessment: Review of patient past medical history, allergies, medications, and health status, including review of relevant consultants reports was performed today as part of a comprehensive evaluation and provision of chronic care management and care coordination services.     SDOH (Social Determinants of Health) assessments and interventions performed:    Advanced Directives Status: Not addressed in this encounter.  CCM Care Plan  Allergies  Allergen Reactions   Ibuprofen Other (See Comments)    Does not take due to hx of renal insufficiency "I have kidney disease"    Lemon Flavor Swelling    Severe Lip Swelling  FRUIT per pt.     Amoxicillin Diarrhea and Other (See Comments)   Tylenol [Acetaminophen] Hives    Cannot take large quantities    Outpatient Encounter Medications as of 06/29/2021  Medication Sig   albuterol (VENTOLIN HFA) 108 (90 Base) MCG/ACT inhaler TAKE 2 PUFFS BY MOUTH EVERY 6 HOURS AS NEEDED FOR WHEEZE OR SHORTNESS OF BREATH (Patient taking differently: Inhale 2 puffs into the lungs every 6 (six) hours as needed for wheezing or shortness of breath.)    Ascorbic Acid (VITAMIN C) 1000 MG tablet Take 1,000 mg by mouth daily.   baclofen (LIORESAL) 20 MG tablet Take 1 tablet 3 times daily   Cholecalciferol (VITAMIN D3) 10 MCG (400 UNIT) tablet Take 400 Units by mouth daily.   Cranberry-Vitamin C-Vitamin E 4200-20-3 MG-MG-UNIT CAPS Take 1 tablet by mouth daily.   cyanocobalamin (,VITAMIN B-12,) 1000 MCG/ML injection Inject 1,000 mcg into the muscle every 3 (three) months.   dapagliflozin propanediol (FARXIGA) 5 MG TABS tablet Take 1 tablet (5 mg total) by mouth daily before breakfast.   desloratadine (CLARINEX) 5 MG tablet TAKE 1 TABLET BY MOUTH  DAILY (Patient taking differently: Take 5 mg by mouth daily.)   diazepam (VALIUM) 10 MG tablet TAKE 2 TABLETS BY MOUTH 3  TIMES DAILY as needed   DIAZEPAM, 20 MG DOSE, NA Place into the nose.   EPINEPHRINE 0.3 mg/0.3 mL IJ SOAJ injection INJECT CONTENTS OF 1 PEN AS NEEDED FOR ALLERGIC  RESPONSE AS DIRECTED BY MD. SEEK MEDICAL ATTENTION  AFTER USE. (Patient taking differently: Inject 0.3 mg into the muscle as needed for anaphylaxis.)   famotidine (PEPCID) 20 MG tablet TAKE 1 TABLET BY MOUTH  TWICE DAILY   FLUoxetine (PROZAC) 10 MG capsule TAKE 1 CAPSULE BY MOUTH  DAILY (Patient taking differently: Take 10 mg by mouth at bedtime.)   gabapentin (NEURONTIN) 300 MG capsule TAKE 1 CAPSULE BY MOUTH TWO TIMES DAILY   glipiZIDE (GLUCOTROL) 5 MG tablet TAKE 1 TABLET BY MOUTH EVERY DAY AT LUNCH   Glucagon (GVOKE HYPOPEN 2-PACK) 0.5 MG/0.1ML SOAJ Inject 0.1 mLs  into the skin as needed (if blood sugar less than 70).   glucose blood test strip Use as instructed to check sugars qac and qhs   hydrOXYzine (VISTARIL) 100 MG capsule TAKE 1 CAPSULE BY MOUTH 3  TIMES DAILY AS NEEDED FOR  ITCHING (Patient taking differently: Take 100 mg by mouth 3 (three) times daily.)   Insulin Pen Needle 32G X 4 MM MISC 1 each by Does not apply route 2 (two) times daily. Dx E11.9   metFORMIN (GLUCOPHAGE) 500 MG tablet TAKE 2 TABLETS (1,000 MG  TOTAL) BY MOUTH 2 (TWO) TIMES DAILY WITH A MEAL. (Patient taking differently: Take 1,000 mg by mouth 2 (two) times daily with a meal.)   Olopatadine HCl 0.7 % SOLN Place 1 drop into both eyes daily.   vitamin E 180 MG (400 UNITS) capsule Take 400 Units by mouth daily.    No facility-administered encounter medications on file as of 06/29/2021.    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   Hiatal hernia 03/21/2013   Anemia, iron deficiency 01/29/2013   Spasm of muscle 09/02/2012   Anemia 08/28/2012   Mild intermittent asthma 08/27/2012   Chronic back pain    Pernicious anemia 10/30/2011   DEGENERATIVE DISC DISEASE 08/18/2010   Asthma with bronchitis 07/28/2010   Recurrent urticaria 07/28/2010    Conditions to be addressed/monitored:  DM II, Depression, Asthma, and Stiff Person Syndrome ; Financial constraints related to costs of living and Limited access to food  Care Plan : Social Work [facility] Plan of Care  Updates made by [name] since 06/29/2021 12:00 AM     Problem: Housing Concerns Surrounding Possible Eviction      Goal: Home Safety Maintained   Start Date: 06/01/2021  Expected End Date: 07/01/2021  This Visit's Progress: On track  Priority: High  Note:   Current Barriers:  Chronic disease management support and education needs related to  DM II, Asthma, Stiff Person Syndrome, and Depression   Financial constraints related to costs of living Difficulty managing  money  Social Worker Clinical Goal(s):  patient will work with SW to identify and address any acute and/or chronic care coordination needs related to the self health management of  DM II, Asthma, Stiff Person Syndrome, and Depression   patient will follow up with [employer] as directed by [employer]  SW Interventions:  Inter-disciplinary care team collaboration (see longitudinal plan of care) Collaboration with Arnette Felts, FNP regarding development and update of comprehensive plan of care as evidenced by provider attestation and co-signature Successful outbound call placed to the patient to assess goal progression Determined the patient did successfully pay her July rent on time to include the late June payment therefore she is no longer facing a possible eviction Discussed the patient just realized her car insurance has gone up by $50 which began in January but she has just realized - patient states that due to this increase she cannot pay her car  payment Patient reports she is working with [employer] to determine if she is eligible for any financial assistance Assessed for current food insecurity - patient indicates she does not have food in the home; patient did not pick up food from the food pantry this past Saturday Encouraged the patient to contact the food pantry today to request an emergency food box Discussed patient plans to contact her sister and daughter to request assistance with food insecurity Patient indicates she is unable to purchase [place] due to cost Assessed if patient is working with pharmacy team to address patient assistance needs - patient is unsure if she is working on a PAP Advised the patient SW would contact pharmacy team to follow up on patient assistance needs Collaboration with Cherylin Mylar PharmD Scheduled follow up call over the next month  Patient Goals/Self-Care Activities patient will:   -  Contact food pantry to request an emergency food  box -Contact family members to request assistance with food insecurity -Contact Social Security regarding possibility of obtaining a part time job  Follow Up Plan:  SW will follow up with the patient over the next month       Follow Up Plan: SW will follow up with patient by phone over the next month      [name], BSW, CDP Social Worker, Certified Dementia Practitioner [facility] Care Management [phone]

## 2021-06-30 ENCOUNTER — Telehealth: Payer: Self-pay

## 2021-06-30 NOTE — Telephone Encounter (Signed)
CARE PLAN ENTRY (see longitudinal plan of care for additional care plan information)  Current Barriers:  Financial Barriers: patient has Medicare and [employer] and reports copay for Evaristo Bury is cost prohibitive at this time  Pharmacist Clinical Goal(s):  Over the next 30 days, patient will work with PharmD and providers to relieve medication access concerns  Interventions: Comprehensive medication review completed; medication list updated in electronic medical record.  Inter-disciplinary care team collaboration (see longitudinal plan of care) Reviewed application process. Patient will provide proof of income, out of pocket spend report, and will sign application. Will collaborate with primary care provider Marianne Sofia for their portion of application. Once completed, will submit to [employer] patient assistance program.  Called patient this morning to help her with patient assistance. I informed the patient that she will need to bring in her income verification and sign the paperwork.  Patient reports that she was up from 9 pm last night until now. She reports that at this time she does not have any energy to do anything. I encouraged her to please bring the paper work to the office. She reports that she might be able to bring the paperwork by on Monday of next week. I will place the paperwork at the front. Patient reports that she also has tingling in her right leg and it feels like she ran through a Sport and exercise psychologist. I will follow up with the PCP team and let them know about her health concern. Patient reports that if she does not answer the phone she might be sleeping but please leave a voicemail.   Patient Self Care Activities:  Patient will provide necessary portions of application  Patient reports that she will not be able to submit information today but will try next week     Cherylin Mylar, PharmD Clinical Pharmacist [facility] [phone]

## 2021-07-01 ENCOUNTER — Telehealth: Payer: Medicare Other

## 2021-07-01 ENCOUNTER — Ambulatory Visit: Payer: Self-pay

## 2021-07-01 ENCOUNTER — Other Ambulatory Visit: Payer: Self-pay

## 2021-07-01 ENCOUNTER — Telehealth: Payer: Self-pay

## 2021-07-01 DIAGNOSIS — E119 Type 2 diabetes mellitus without complications: Secondary | ICD-10-CM | POA: Diagnosis not present

## 2021-07-01 DIAGNOSIS — G2582 Stiff-man syndrome: Secondary | ICD-10-CM

## 2021-07-01 DIAGNOSIS — K219 Gastro-esophageal reflux disease without esophagitis: Secondary | ICD-10-CM

## 2021-07-01 DIAGNOSIS — J45909 Unspecified asthma, uncomplicated: Secondary | ICD-10-CM

## 2021-07-01 DIAGNOSIS — F32A Depression, unspecified: Secondary | ICD-10-CM

## 2021-07-01 MED ORDER — NOVOLIN 70/30 FLEXPEN (70-30) 100 UNIT/ML ~~LOC~~ SUPN: 10.0000 [IU] | PEN_INJECTOR | Freq: Every day | SUBCUTANEOUS | 1 refills | Status: DC

## 2021-07-01 MED ORDER — NOVOLIN R FLEXPEN 100 UNIT/ML IJ SOPN: 10.0000 [IU] | PEN_INJECTOR | Freq: Three times a day (TID) | INTRAMUSCULAR | 1 refills | Status: DC | PRN

## 2021-07-01 NOTE — Progress Notes (Signed)
Patient was notified that we are going to send meds to [facility] because they will only be $10. Pt declined and stated she cant afford the medications. YL,RMA

## 2021-07-01 NOTE — Telephone Encounter (Signed)
  Care Management   Follow Up Note   07/01/2021 Name: Sirena Riddle MRN: [id number] DOB: April 16, 1966   Referred by: Arnette Felts, FNP Reason for referral : Chronic Care Management (Inbound call from patient )   An unsuccessful telephone outreach was attempted today. The patient was referred to the case management team for assistance with care management and care coordination.   Follow Up Plan: A HIPPA compliant phone message was left for the patient providing contact information and requesting a return call.   Delsa Sale, RN, BSN, CCM Care Management Coordinator [facility] Care Management/[facility]  Direct Phone: [phone]

## 2021-07-03 ENCOUNTER — Other Ambulatory Visit: Payer: Self-pay | Admitting: Nurse Practitioner

## 2021-07-03 DIAGNOSIS — E119 Type 2 diabetes mellitus without complications: Secondary | ICD-10-CM

## 2021-07-03 MED ORDER — NOVOLIN R FLEXPEN 100 UNIT/ML IJ SOPN: PEN_INJECTOR | INTRAMUSCULAR | 1 refills | Status: DC

## 2021-07-04 ENCOUNTER — Other Ambulatory Visit: Payer: Self-pay | Admitting: Nurse Practitioner

## 2021-07-04 ENCOUNTER — Telehealth: Payer: Self-pay

## 2021-07-04 NOTE — Chronic Care Management (AMB) (Signed)
   Patient aware of telephone appointment with Cherylin Mylar CPP on 07-05-2021 at 3:00. Patient aware to have/bring all medications, supplements, blood pressure and/or blood sugar logs to visit.  Questions: Have you had any recent office visit or specialist visit outside of [facility]? Patient states no  Are there any concerns you would like to discuss during your office visit? Patient stated no  Are you having any problems obtaining your medications? (Whether it pharmacy issues or cost) Patient stated yes having issues with cost.  If patient has any PAP medications ask if they are having any problems getting their PAP medication or refill? Yes  Patient states she decided not to take the novolin. Patient states her sister was able to buy her tresiba.  Star Rating Drug: Glipizide 5 mg- Last filled 05-06-2021 [facility] Farxiga 5 mg- Last filled 06-15-2021 [facility] Metformin 500 mg - Last filled 01-24-2021 [facility]  Any gaps in medications fill history? yes   Huey Romans [facility] Clinical Pharmacist Assistant [phone]

## 2021-07-05 ENCOUNTER — Telehealth: Payer: Self-pay

## 2021-07-05 ENCOUNTER — Ambulatory Visit: Payer: Self-pay

## 2021-07-05 ENCOUNTER — Inpatient Hospital Stay: Payer: Medicare Other | Admitting: Family

## 2021-07-05 ENCOUNTER — Ambulatory Visit: Payer: Medicare Other

## 2021-07-05 ENCOUNTER — Inpatient Hospital Stay: Payer: Medicare Other

## 2021-07-05 ENCOUNTER — Telehealth: Payer: Medicare Other

## 2021-07-05 DIAGNOSIS — F32A Depression, unspecified: Secondary | ICD-10-CM

## 2021-07-05 DIAGNOSIS — G2582 Stiff-man syndrome: Secondary | ICD-10-CM

## 2021-07-05 DIAGNOSIS — E119 Type 2 diabetes mellitus without complications: Secondary | ICD-10-CM

## 2021-07-05 DIAGNOSIS — J45909 Unspecified asthma, uncomplicated: Secondary | ICD-10-CM

## 2021-07-05 DIAGNOSIS — K219 Gastro-esophageal reflux disease without esophagitis: Secondary | ICD-10-CM

## 2021-07-05 NOTE — Progress Notes (Signed)
Chronic Care Management Pharmacy Note  07/06/2021 Name:  Mylynn Dinh MRN:  [id number] DOB:  Nov 28, 1966  Summary: Patient reports her sister was able to buy a pen of Tresiba for her. She is not going to take any other type of insulin.   Recommendations/Changes made from today's visit: Recommend patient bring in paperwork for [place].   Plan: Will collaborate with PCP to determine next steps for patient. Patient to come in and complete patient assistance paperwork and bring corresponding documentation.    Subjective: Paiden Caraveo is an 55 y.o. year old female who is a primary patient of Minette Brine, [place].  The CCM team was consulted for assistance with disease management and care coordination needs.    Engaged with patient by telephone for follow up visit in response to provider referral for pharmacy case management and/or care coordination services. Patient reports that she is sleepy and just woke up. Patient reports that she keeps falling asleep on the phone but she would like to talk.   Consent to Services:  The patient was given information about Chronic Care Management services, agreed to services, and gave verbal consent prior to initiation of services.  Please see initial visit note for detailed documentation.   Patient Care Team: Minette Brine, [place] as PCP - General (General Practice) Rex Kras, Claudette Stapler, RN as [place] as [place] Management  Recent office visits: 05/09/2021 PCP OV   Recent consult visits: 06/21/2021 Neurology [facility] visits: 05/05/2021 - Hypoglycemia   Objective:  Lab Results  Component Value Date   CREATININE 1.75 (H) 05/06/2021   BUN 29 (H) 05/06/2021   GFRNONAA 34 (L) 05/06/2021   GFRAA 59 (L) 01/25/2021   NA 136 05/06/2021   K 4.3 05/06/2021   CALCIUM 9.1 05/06/2021   CO2 22 05/06/2021   GLUCOSE 209 (H) 05/06/2021    Lab Results  Component Value Date/Time    HGBA1C 13.0 (H) 05/06/2021 05:41 AM   HGBA1C 12.3 (H) 01/25/2021 05:23 PM   MICROALBUR $RemoveBef'30mg'guUnVavMel$  03/22/2021 11:39 AM   MICROALBUR 30 06/04/2020 12:29 PM    Last diabetic Eye exam: No results found for: HMDIABEYEEXA  Last diabetic Foot exam: No results found for: HMDIABFOOTEX   Lab Results  Component Value Date   CHOL 172 06/03/2020   HDL 75 06/03/2020   LDLCALC 83 06/03/2020   TRIG 76 06/03/2020   CHOLHDL 2.3 06/03/2020    Hepatic Function Latest Ref Rng & Units 05/06/2021 05/05/2021 01/25/2021  Total Protein 6.5 - 8.1 g/dL 5.9(L) 6.2(L) 6.8  Albumin 3.5 - 5.0 g/dL 3.1(L) 3.3(L) 4.6  AST 15 - 41 U/L $Remo'27 24 12  'Owkve$ ALT 0 - 44 U/L 72(H) 81(H) 27  Alk Phosphatase 38 - 126 U/L 80 85 109  Total Bilirubin 0.3 - 1.2 mg/dL 0.7 0.4 <0.2    Lab Results  Component Value Date/Time   TSH 1.150 06/03/2020 05:00 PM   TSH 1.176 12/14/2015 10:23 AM   TSH 1.200 12/18/2014 05:30 AM    CBC Latest Ref Rng & Units 05/06/2021 05/05/2021 05/05/2021  WBC 4.0 - 10.5 K/uL 8.9 10.3 10.8(H)  Hemoglobin 12.0 - 15.0 g/dL 10.4(L) 11.2(L) 11.2(L)  Hematocrit 36.0 - 46.0 % 32.6(L) 34.2(L) 34.8(L)  Platelets 150 - 400 K/uL 240 219 285    Lab Results  Component Value Date/Time   VD25OH 74.0 12/26/2016 10:02 AM   VD25OH 58.8 08/24/2016 10:11 AM    Clinical ASCVD: No  The 10-year ASCVD risk  score Mikey Bussing DC Jr., et al., 2013) is: 1.9%   Values used to calculate the score:     Age: 55 years     Sex: Female     Is Non-Hispanic African American: Yes     Diabetic: Yes     Tobacco smoker: No     Systolic Blood Pressure: 92 mmHg     Is BP treated: No     HDL Cholesterol: 75 mg/dL     Total Cholesterol: 172 mg/dL    Depression screen [facility] 2/9 06/03/2020 02/16/2020 01/15/2020  Decreased Interest 0 0 0  Down, Depressed, Hopeless 0 0 0  PHQ - 2 Score 0 0 0  Altered sleeping 0 - 0  Tired, decreased energy 0 - 0  Change in appetite 0 - 0  Feeling bad or failure about yourself  0 - 0  Trouble concentrating 0 - 0  Moving  slowly or fidgety/restless 0 - 0  Suicidal thoughts 0 - 0  PHQ-9 Score 0 - 0  Difficult doing work/chores Not difficult at all - Not difficult at all  Some recent data might be hidden      Social History   Tobacco Use  Smoking Status Never  Smokeless Tobacco Never  Tobacco Comments   Never Used Tobacco   BP Readings from Last 3 Encounters:  06/21/21 92/62  05/09/21 122/80  05/06/21 94/66   Pulse Readings from Last 3 Encounters:  06/21/21 77  05/09/21 (!) 104  05/06/21 69   Wt Readings from Last 3 Encounters:  06/21/21 92 lb (41.7 kg)  05/09/21 92 lb 12.8 oz (42.1 kg)  05/06/21 90 lb 9.7 oz (41.1 kg)   BMI Readings from Last 3 Encounters:  06/21/21 16.30 kg/m  05/09/21 17.19 kg/m  05/06/21 16.57 kg/m    Assessment/Interventions: Review of patient past medical history, allergies, medications, health status, including review of consultants reports, laboratory and other test data, was performed as part of comprehensive evaluation and provision of chronic care management services.   SDOH:  (Social Determinants of Health) assessments and interventions performed: No  SDOH Screenings   Alcohol Screen: Not on file  Depression (PHQ2-9): Not on file  Financial Resource Strain: High Risk   Difficulty of Paying Living Expenses: Very hard  Food Insecurity: Food Insecurity Present   Worried About Charity fundraiser in the Last Year: Often true   Arboriculturist in the Last Year: Often true  Housing: High Risk   Last Housing Risk Score: 2  Physical Activity: Not on file  Social Connections: Not on file  Stress: Not on file  Tobacco Use: Low Risk    Smoking Tobacco Use: Never   Smokeless Tobacco Use: Never  Transportation Needs: Not on file    CCM Care Plan  Allergies  Allergen Reactions   Ibuprofen Other (See Comments)    Does not take due to hx of renal insufficiency "I have kidney disease"    Lemon Flavor Swelling    Severe Lip Swelling  FRUIT per pt.      Amoxicillin Diarrhea and Other (See Comments)   Tylenol [Acetaminophen] Hives    Cannot take large quantities    Medications Reviewed Today     Reviewed by Lynne Logan, RN (Registered Nurse) on 07/05/21 at 4  Med List Status: <None>   Medication Order Taking? Sig Documenting Provider Last Dose Status Informant  albuterol (VENTOLIN HFA) 108 (90 Base) MCG/ACT inhaler [id number] No TAKE 2 PUFFS BY MOUTH EVERY  6 HOURS AS NEEDED FOR WHEEZE OR SHORTNESS OF BREATH  Patient taking differently: Inhale 2 puffs into the lungs every 6 (six) hours as needed for wheezing or shortness of breath.   Minette Brine, FNP Taking Active   Ascorbic Acid (VITAMIN C) 1000 MG tablet [id number] No Take 1,000 mg by mouth daily. [provider] Taking Active Self  baclofen (LIORESAL) 20 MG tablet [id number]  Take 1 tablet 3 times daily Suzzanne Cloud, NP  Active   Cholecalciferol (VITAMIN D3) 10 MCG (400 UNIT) tablet [id number] No Take 400 Units by mouth daily. [provider] Taking Active Self  Cranberry-Vitamin C-Vitamin E 4200-20-3 MG-MG-UNIT CAPS [id number] No Take 1 tablet by mouth daily. [provider] Taking Active Self  cyanocobalamin (,VITAMIN B-12,) 1000 MCG/ML injection [id number] No Inject 1,000 mcg into the muscle every 3 (three) months. [provider] Taking Active Self           Med Note Mel Almond, MEGAN T   Mon Apr 14, 2019  2:24 PM)    dapagliflozin propanediol (FARXIGA) 5 MG TABS tablet [id number] No Take 1 tablet (5 mg total) by mouth daily before breakfast. Minette Brine, FNP Taking Active Self  desloratadine (CLARINEX) 5 MG tablet [id number] No TAKE 1 TABLET BY MOUTH  DAILY  Patient taking differently: Take 5 mg by mouth daily.   Minette Brine, FNP Taking Active   diazepam (VALIUM) 10 MG tablet [id number] No TAKE 2 TABLETS BY MOUTH 3  TIMES DAILY as needed Lorella Nimrod, MD Taking Active   DIAZEPAM, 20 MG DOSE, NA [id number] No Place into the nose. [provider] Taking Active   EPINEPHRINE 0.3 mg/0.3 mL IJ SOAJ injection [id number] No INJECT CONTENTS OF 1 PEN AS NEEDED FOR ALLERGIC  RESPONSE AS DIRECTED BY MD. SEEK MEDICAL ATTENTION  AFTER USE.  Patient taking differently: Inject 0.3 mg into the muscle as needed for anaphylaxis.   Minette Brine, FNP Taking Active   famotidine (PEPCID) 20 MG tablet [id number] No TAKE 1 TABLET BY MOUTH  TWICE DAILY Minette Brine, FNP Taking Active   FLUoxetine (PROZAC) 10 MG capsule [id number] No TAKE 1 CAPSULE BY MOUTH  DAILY  Patient taking differently: Take 10 mg by mouth at bedtime.   Suzzanne Cloud, NP Taking Active   gabapentin (NEURONTIN) 300 MG capsule [id number] No TAKE 1 CAPSULE BY MOUTH TWO TIMES DAILY Suzzanne Cloud, NP Taking Active   glipiZIDE (GLUCOTROL) 5 MG tablet [id number] No TAKE 1 TABLET BY MOUTH EVERY DAY AT LUNCH Minette Brine, FNP Taking Active   Glucagon (GVOKE HYPOPEN 2-PACK) 0.5 MG/0.1ML SOAJ [id number] No Inject 0.1 mLs into the skin as needed (if blood sugar less than 70). Minette Brine, FNP Taking Active   glucose blood test strip [id number] No Use as instructed to check sugars qac and qhs Ghumman, Ramandeep, NP Taking Active   hydrOXYzine (VISTARIL) 100 MG capsule [id number] No TAKE 1 CAPSULE BY MOUTH 3  TIMES DAILY AS NEEDED FOR  ITCHING  Patient taking differently: Take 100 mg by mouth 3 (three) times daily.   Minette Brine, FNP Taking Active   insulin degludec (TRESIBA) 100 UNIT/ML FlexTouch Pen [id number] No Inject 13 Units into the skin daily. [provider] Taking Active   insulin isophane & regular human (NOVOLIN 70/30 FLEXPEN) (70-30) 100 UNIT/ML KwikPen [id number] No Inject 10 Units into the skin daily at 12 noon.  Patient not taking: Reported on 07/01/2021   Minette Brine, FNP Not Taking Active   Insulin Pen  Needle 32G X 4 MM MISC [id number] No 1 each by Does not apply route 2 (two) times daily. Dx E11.9 Minette Brine, FNP Taking Active   Insulin Regular Human (NOVOLIN R  FLEXPEN) 100 UNIT/ML SOPN [id number]  Inject according to sliding scale 3 times a day before meals. Dx: e11.65 inject 2 units if sugars are 150 -199, 4 units 200 - 249, 6 units 250 - 299, 8 units 300 - 349, 10 units if above 350 Laurance Flatten, Bruce, FNP  Active   metFORMIN (GLUCOPHAGE) 500 MG tablet [id number] No TAKE 2 TABLETS (1,000 MG TOTAL) BY MOUTH 2 (TWO) TIMES DAILY WITH A MEAL.  Patient taking differently: Take 1,000 mg by mouth 2 (two) times daily with a meal.   Minette Brine, FNP Taking Active   Olopatadine HCl 0.7 % SOLN [id number] No Place 1 drop into both eyes daily. [provider] Taking Active Self  vitamin E 180 MG (400 UNITS) capsule [id number] No Take 400 Units by mouth daily.  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   Hiatal hernia 03/21/2013   Anemia, iron deficiency 01/29/2013   Spasm of muscle 09/02/2012   Anemia 08/28/2012   Mild intermittent asthma 08/27/2012   Chronic back pain    Pernicious anemia 10/30/2011   DEGENERATIVE DISC DISEASE 08/18/2010   Asthma with bronchitis 07/28/2010   Recurrent urticaria 07/28/2010    Immunization History  Administered Date(s) Administered   Tdap 04/25/2016    Conditions to be addressed/monitored:  Diabetes and GERD  Care Plan : [place]  Updates made by Mayford Knife, [place] since 07/06/2021 12:00 AM     Problem: DM II, GERD   Priority:  High     Long-Range Goal: Disease Management   Start Date: 07/05/2021  Recent Progress: On track  Priority: High  Note:   Current Barriers:  Unable to independently afford treatment regimen Unable to independently monitor therapeutic efficacy Does not adhere to prescribed medication regimen Does not maintain contact with provider office  Pharmacist Clinical Goal(s):  Patient will verbalize ability to afford treatment regimen achieve adherence to monitoring guidelines and medication adherence to achieve therapeutic efficacy through collaboration with PharmD and provider.   Interventions: 1:1 collaboration with Minette Brine, FNP regarding development and update of comprehensive plan of care as evidenced by provider attestation and co-signature Inter-disciplinary care team collaboration (see longitudinal plan of care) Comprehensive medication review performed; medication list updated in electronic medical record  Diabetes (A1c goal <7%) -Uncontrolled -Current medications: Farxiga 10 mg tablet daily  Glipizide 5 mg tablet once per day Tresiba 100 unit/ml - 13 units daily - sister Johnsie picked up the medication Cost her 25 dollars  for a 30 day supply Metformin $RemoveBeforeDE'500mg'ugQNPLpRdgjjbNk$  taking two tablets daily  Novolin 70-30 Flex Pen - inject 10 units  Patient reports that she is not going to take it  Novolin R 100 unit/ml - sliding scale Patient reports that she is not going to take it  -Current home glucose readings : patient reports her glucose reading are normal fasting glucose: 100 something usually, she reports not checking it today  -Denies hypoglycemic/hyperglycemic symptoms -Current meal patterns:  breakfast: oatmeal in the morning lunch: chicken livers, with a roll  dinner: patient did not want to discuss what she has been having for dinner  snacks: she did not discuss any snacks drinks: will try to discuss during next visit -Current exercise: will discuss during next visit  -Educated  on A1c and blood sugar goals; Complications of diabetes including kidney damage, retinal damage, and cardiovascular disease; -Counseled to check feet daily and get yearly eye exams -Recommended to continue current medication Collaborated with PCP to discuss patients options, at this point patient is unwilling to use less expensive medication but can not afford [place].  -Recommended that patient come in and bring pension and social security documentation for patient assistance but she reports that she is not sure when she will be able to come in at this time.   Query Hyperlipidemia: (LDL goal < 70) -Uncontrolled -Current treatment: Not currently on any medication regimen  -Current dietary patterns: patient reports eating oatmeal everyday  -Educated on Cholesterol goals;  Importance of limiting foods high in cholesterol; Strategies to manage statin-induced myalgias; -Counseled on diet and exercise extensively -Collaborate with PCP to determine if patient should be started on Statin.   GERD (Goal: Reduce signs and symptoms of reflux) -Controlled -Current treatment  Famotidine 20 mg tablet twice per day -Recommended to continue current medication   Patient Goals/Self-Care Activities Over the next 90 days, patient will:  - take medications as prescribed focus on medication adherence by using a reminder system for her medication regimen  check glucose at least once per day, document, and provide at future appointments  Follow Up Plan:  The patient has been provided with contact information for the care management team and has been advised to call with any health related questions or concerns.            Medication Assistance: Application for Tresiba  medication assistance program. in process.  Anticipated assistance start date 08/2021.  See plan of care for additional detail.  Compliance/Adherence/Medication fill history: Care Gaps: Pneumococcal Vaccine  COVID-19 Vaccine -  2nd booster Opthamology Exam  Shingrix Vaccine  Star-Rating Drugs: Metformin 500 mg tablet   Patient's preferred pharmacy is:  [id number] [place], [place] [address] Phone: [phone] Fax: [phone]  [facility] [address] [place] [address] Phone: [phone] Fax: [phone]  [facility]  ([place], [place] [address] Phone: [phone] Fax: [phone]  [id number] [place], [address] [place] [address] Phone: [phone] Fax: [phone]  [facility] [address] [place] [address] Phone: [phone] Fax: [phone]  [id number] [place], [place] [address] [place] [address] Phone: [phone] Fax: [phone]  Uses pill box? No - patient prefers keeping her medication in the original containers  Pt endorses 75% compliance  We discussed: Benefits of medication synchronization, packaging and delivery as well as enhanced pharmacist oversight with [employer].  Patient decided to: Continue current medication management strategy  Care Plan and Follow Up Patient Decision:  Patient agrees to Care Plan and Follow-up.  Plan: The patient has been provided with contact information for the care management team and has been advised to call with any health related questions or concerns.   Orlando Penner, PharmD Clinical Pharmacist [facility] [phone]

## 2021-07-05 NOTE — Telephone Encounter (Signed)
  Care Management   Follow Up Note   07/05/2021 Name: Sherri [name] MRN: [id number] DOB: 1966/01/14   Referred by: Arnette Felts, FNP Reason for referral : Chronic Care Management (Unsuccessful call)   SW placed an unsuccessful outbound call to the patient in response to a voice message received. SW left a HIPAA compliant voice message requesting a return call.  Follow Up Plan:  SW will follow up with patient as scheduled on 8.3.22.  [name], BSW, CDP Social Worker, Certified Dementia Practitioner [facility] Care Management [phone]

## 2021-07-06 ENCOUNTER — Inpatient Hospital Stay: Admission: RE | Admit: 2021-07-06 | Payer: Medicare Other | Source: Ambulatory Visit

## 2021-07-06 ENCOUNTER — Telehealth: Payer: Medicare Other

## 2021-07-06 NOTE — Chronic Care Management (AMB) (Signed)
Chronic Care Management   CCM RN Visit Note  07/01/2021 Name: Sherri [name] MRN: [id number] DOB: [birth year]  Subjective: Sherri [name] is a 55 y.o. year old female who is a primary care patient of Minette Brine, [place]. The care management team was consulted for assistance with disease management and care coordination needs.    Engaged with patient by telephone for follow up visit in response to provider referral for case management and/or care coordination services.   Consent to Services:  The patient was given information about Chronic Care Management services, agreed to services, and gave verbal consent prior to initiation of services.  Please see initial visit note for detailed documentation.   Patient agreed to services and verbal consent obtained.   Assessment: Review of patient past medical history, allergies, medications, health status, including review of consultants reports, laboratory and other test data, was performed as part of comprehensive evaluation and provision of chronic care management services.   SDOH (Social Determinants of Health) assessments and interventions performed:  Yes, no acute needs   CCM Care Plan  Allergies  Allergen Reactions   Ibuprofen Other (See Comments)    Does not take due to hx of renal insufficiency "I have kidney disease"    Lemon Flavor Swelling    Severe Lip Swelling  FRUIT per pt.     Amoxicillin Diarrhea and Other (See Comments)   Tylenol [Acetaminophen] Hives    Cannot take large quantities    Outpatient Encounter Medications as of 07/01/2021  Medication Sig   dapagliflozin propanediol (FARXIGA) 5 MG TABS tablet Take 1 tablet (5 mg total) by mouth daily before breakfast.   gabapentin (NEURONTIN) 300 MG capsule TAKE 1 CAPSULE BY MOUTH TWO TIMES DAILY   glipiZIDE (GLUCOTROL) 5 MG tablet TAKE 1 TABLET BY MOUTH EVERY DAY AT LUNCH   insulin degludec (TRESIBA) 100 UNIT/ML FlexTouch Pen Inject 13 Units into the skin daily.   metFORMIN  (GLUCOPHAGE) 500 MG tablet TAKE 2 TABLETS (1,000 MG TOTAL) BY MOUTH 2 (TWO) TIMES DAILY WITH A MEAL. (Patient taking differently: Take 1,000 mg by mouth 2 (two) times daily with a meal.)   albuterol (VENTOLIN HFA) 108 (90 Base) MCG/ACT inhaler TAKE 2 PUFFS BY MOUTH EVERY 6 HOURS AS NEEDED FOR WHEEZE OR SHORTNESS OF BREATH (Patient taking differently: Inhale 2 puffs into the lungs every 6 (six) hours as needed for wheezing or shortness of breath.)   Ascorbic Acid (VITAMIN C) 1000 MG tablet Take 1,000 mg by mouth daily.   baclofen (LIORESAL) 20 MG tablet Take 1 tablet 3 times daily   Cholecalciferol (VITAMIN D3) 10 MCG (400 UNIT) tablet Take 400 Units by mouth daily.   Cranberry-Vitamin C-Vitamin E 4200-20-3 MG-MG-UNIT CAPS Take 1 tablet by mouth daily.   cyanocobalamin (,VITAMIN B-12,) 1000 MCG/ML injection Inject 1,000 mcg into the muscle every 3 (three) months.   desloratadine (CLARINEX) 5 MG tablet TAKE 1 TABLET BY MOUTH  DAILY (Patient taking differently: Take 5 mg by mouth daily.)   diazepam (VALIUM) 10 MG tablet TAKE 2 TABLETS BY MOUTH 3  TIMES DAILY as needed   DIAZEPAM, 20 MG DOSE, NA Place into the nose.   EPINEPHRINE 0.3 mg/0.3 mL IJ SOAJ injection INJECT CONTENTS OF 1 PEN AS NEEDED FOR ALLERGIC  RESPONSE AS DIRECTED BY MD. SEEK MEDICAL ATTENTION  AFTER USE. (Patient taking differently: Inject 0.3 mg into the muscle as needed for anaphylaxis.)   FLUoxetine (PROZAC) 10 MG capsule TAKE 1 CAPSULE BY MOUTH  DAILY (Patient taking differently:  Take 10 mg by mouth at bedtime.)   Glucagon (GVOKE HYPOPEN 2-PACK) 0.5 MG/0.1ML SOAJ Inject 0.1 mLs into the skin as needed (if blood sugar less than 70).   glucose blood test strip Use as instructed to check sugars qac and qhs   hydrOXYzine (VISTARIL) 100 MG capsule TAKE 1 CAPSULE BY MOUTH 3  TIMES DAILY AS NEEDED FOR  ITCHING (Patient taking differently: Take 100 mg by mouth 3 (three) times daily.)   insulin isophane & regular human (NOVOLIN 70/30 FLEXPEN)  (70-30) 100 UNIT/ML KwikPen Inject 10 Units into the skin daily at 12 noon. (Patient not taking: Reported on 07/05/2021)   Insulin Pen Needle 32G X 4 MM MISC 1 each by Does not apply route 2 (two) times daily. Dx E11.9 (Patient not taking: Reported on 07/05/2021)   Olopatadine HCl 0.7 % SOLN Place 1 drop into both eyes daily.   vitamin E 180 MG (400 UNITS) capsule Take 400 Units by mouth daily.    [DISCONTINUED] famotidine (PEPCID) 20 MG tablet TAKE 1 TABLET BY MOUTH  TWICE DAILY   [DISCONTINUED] Insulin Regular Human (NOVOLIN R FLEXPEN) 100 UNIT/ML SOPN Inject 10 Units as directed 3 (three) times daily between meals as needed. Use with sliding scale Dx: e11.65 inject 2 units if sugars are 150 -199, 4 units 200 - 249, 6 units 250 - 299, 8 units 300 - 349, 10 units if above 350 (Patient not taking: Reported on 07/01/2021)   No facility-administered encounter medications on file as of 07/01/2021.    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   Hiatal hernia 03/21/2013   Anemia, iron deficiency 01/29/2013   Spasm of muscle 09/02/2012   Anemia 08/28/2012   Mild intermittent asthma 08/27/2012   Chronic back pain    Pernicious anemia 10/30/2011   DEGENERATIVE DISC DISEASE 08/18/2010   Asthma with bronchitis 07/28/2010   Recurrent urticaria 07/28/2010    Conditions to be addressed/monitored: DMII, Stiff-Person Syndrome, Asthma with bronchitis, GERD, Depression  Care Plan : Diabetes Type 2 (Adult)   Updates made by Lynne Logan, RN since 07/01/2021 12:00 AM     Problem: Glycemic Management (Diabetes, Type 2)   Priority: High     Long-Range Goal: Glycemic Management Optimized   Start Date: 01/31/2021  Expected End Date: 01/31/2022  Recent Progress: On track  Priority: High  Note:    Objective:  Lab Results  Component Value Date   HGBA1C 13.0 (H) 05/06/2021   Lab Results  Component Value Date   CREATININE 1.75 (H) 05/06/2021   CREATININE 1.89 (H) 05/05/2021   CREATININE 2.05 (H) 05/05/2021   Lab Results  Component Value Date   EGFR 73 (L) 03/22/2017   Current Barriers:  Knowledge Deficits related to basic Diabetes pathophysiology and self care/management Knowledge Deficits related to medications used for management of diabetes Difficulty obtaining or cannot afford medications Financial Constraints Limited Social Support Case Manager Clinical Goal(s):  Patient will demonstrate improved adherence to prescribed  treatment plan for diabetes self care/management as evidenced by: daily monitoring and recording of CBG  adherence to ADA/ carb modified diet exercise 5 days/week adherence to prescribed medication regimen contacting provider for new or worsened symptoms or questions Interventions:  07/01/21 completed successful inbound call with patient  Collaboration with Minette Brine, [place] regarding development and update of comprehensive plan of care as evidenced by provider attestation and co-signature Inter-disciplinary care team collaboration (see longitudinal plan of care) Provided education to patient about basic DM disease process Review of patient status, including review of consultants reports, relevant laboratory and other test results, and medications completed. Reviewed medications with patient and discussed importance of medication adherence Determined patient is having financial difficulty paying for her Sigurd Sos with embedded Pharm D Orlando Penner and  PCP provider Minette Brine FNP requesting Tyler Aas samples if available  Reviewed scheduled/upcoming provider appointments including: next embedded Pharm D follow up call scheduled for 07/05/21 _0 :00 PM  Discussed plans with patient for ongoing care management follow up and provided patient with direct contact information for care management team Self-Care Activities Self administers oral medications as prescribed Self administers injectable DM medication Tyler Aas) as prescribed Attends all scheduled provider appointments Checks blood sugars as prescribed and utilize hyper and hypoglycemia protocol as needed Adheres to prescribed ADA/carb modified Patient Goals: - check blood sugar at prescribed times - check blood sugar before and after exercise - check blood sugar if I feel it is too high or too low - enter blood sugar readings and medication or insulin into daily log - take the blood sugar log to all doctor visits - take the blood sugar meter to all doctor visits - manage portion size - keep appointment with eye doctor - schedule appointment with eye doctor - check feet daily for cuts, sores or redness - do heel pump exercise 2 to 3 times each day - keep feet up while sitting - trim toenails straight across - wash and dry feet carefully every day - wear comfortable, cotton socks - wear comfortable, well-fitting shoes  Follow Up Plan: Telephone follow up appointment with care management team member scheduled for: 07/05/21    Plan:Telephone follow up appointment with care management team member scheduled for:  07/05/21  Barb Merino, RN, BSN, CCM Care Management Coordinator [facility]  Direct Phone: [phone]

## 2021-07-06 NOTE — Patient Instructions (Signed)
Visit Information It was great speaking with you today!  Please let me know if you have any questions about our visit.   Goals Addressed             This Visit's Progress    Manage My Medicine       Timeframe:  Long-Range Goal Priority:  High Start Date: 01/31/2021                        Expected End Date: 08/11/2021            Follow Up Date : 08/11/2021 - call for medicine refill 2 or 3 days before it runs out - call if I am sick and can't take my medicine - keep a list of all the medicines I take; vitamins and herbals too - use a pillbox to sort medicine - use an alarm clock or phone to remind me to take my medicine    Why is this important?   These steps will help you keep on track with your medicines.            Problem Identified: DM II, GERD   Priority: High     Long-Range Goal: Disease Management   Start Date: 07/05/2021  Recent Progress: On track  Priority: High  Note:   Current Barriers:  Unable to independently afford treatment regimen Unable to independently monitor therapeutic efficacy Does not adhere to prescribed medication regimen Does not maintain contact with provider office  Pharmacist Clinical Goal(s):  Patient will verbalize ability to afford treatment regimen achieve adherence to monitoring guidelines and medication adherence to achieve therapeutic efficacy through collaboration with PharmD and provider.   Interventions: 1:1 collaboration with Arnette Felts, FNP regarding development and update of comprehensive plan of care as evidenced by provider attestation and co-signature Inter-disciplinary care team collaboration (see longitudinal plan of care) Comprehensive medication review performed; medication list updated in electronic medical record  Diabetes (A1c goal <7%) -Uncontrolled -Current medications: Farxiga 10 mg tablet daily  Glipizide 5 mg tablet once per day Tresiba 100 unit/ml - 13 units daily - sister Johnsie picked up the  medication Cost her 25 dollars  for a 30 day supply Metformin 500mg  taking two tablets daily  Novolin 70-30 Flex Pen - inject 10 units  Patient reports that she is not going to take it Novolin R 100 unit/ml - sliding scale Patient reports that she is not going to take it  -Current home glucose readings : patient reports her glucose reading are normal fasting glucose: 100 something usually, she reports not checking it today  -Denies hypoglycemic/hyperglycemic symptoms -Current meal patterns:  breakfast: oatmeal in the morning lunch: chicken livers, with a roll  dinner: patient did not want to discuss what she has been having for dinner  snacks: she did not discuss any snacks drinks: will try to discuss during next visit -Current exercise: will discuss during next visit  -Educated on A1c and blood sugar goals; Complications of diabetes including kidney damage, retinal damage, and cardiovascular disease; -Counseled to check feet daily and get yearly eye exams -Recommended to continue current medication Collaborated with PCP to discuss patients options, at this point patient is unwilling to use less expensive medication but can not afford 06-16-1997.  -Recommended that patient come in and bring pension and social security documentation for patient assistance but she reports that she is not sure when she will be able to come in at this time.   Query  Hyperlipidemia: (LDL goal < 70) -Uncontrolled -Current treatment: Not currently on any medication regimen  -Current dietary patterns: patient reports eating oatmeal everyday  -Educated on Cholesterol goals;  Importance of limiting foods high in cholesterol; Strategies to manage statin-induced myalgias; -Counseled on diet and exercise extensively -Collaborate with PCP to determine if patient should be started on Statin.   GERD (Goal: Reduce signs and symptoms of reflux) -Controlled -Current treatment  Famotidine 20 mg tablet twice per  day -Recommended to continue current medication   Patient Goals/Self-Care Activities Over the next 90 days, patient will:  - take medications as prescribed focus on medication adherence by using a reminder system for her medication regimen  check glucose at least once per day, document, and provide at future appointments  Follow Up Plan:  The patient has been provided with contact information for the care management team and has been advised to call with any health related questions or concerns.             Patient agreed to services and verbal consent obtained.   The patient verbalized understanding of instructions, educational materials, and care plan provided today and agreed to receive a mailed copy of patient instructions, educational materials, and care plan.   Cherylin Mylar, PharmD Clinical Pharmacist [facility] [phone]

## 2021-07-06 NOTE — Patient Instructions (Signed)
Goals Addressed      Diabetes treatment optimized   On track    Timeframe:  Long-Range Goal Priority:  High Start Date: 01/31/21/                            Expected End Date:  01/31/22  Follow Up Date: 07/05/21  Continue to follow DM dietary and exercise recommendations Continue to monitor CBG's daily before meals and at bedtime, alerting CCM team and or PCP of abnormal results  Work with the embedded Pharm D for evaluation of current treatment of DM and consideration for treatment change Follow up with Endocrinology as directed/referred by PCP  Pick up Farxiga samples from PCP office and start this medication as directed Follow up with the Nutritionist once referred by PCP  Increase your water intake and strictly adhere to low carb diet, especially while BS are elevated Take all of your DM medications exactly as prescribed

## 2021-07-07 ENCOUNTER — Telehealth: Payer: Self-pay

## 2021-07-07 NOTE — Chronic Care Management (AMB) (Signed)
Chronic Care Management Pharmacy Assistant   Name: Sherri [name]  MRN: [id number] DOB: Oct 08, 1966  Reason for Encounter: Patient Assistance Coordination  07/07/2021- Patient assistance application filled out for [place] with [employer] patient assistance program and Marcelline Deist with [employer] Patient assistance program. Called patient to inform applications are ready for her signature and income documentation needed. Patient aware, she will come by the office on Monday to sign forms and bring income documentation. While speaking with patient she mentioned when she woke up this morning her blood sugar was at 75, patient immediately ate, she has some fruit and cream oatmeal and a spoonful of syrup. Patient states her blood sugar went up to 106 and just before lunch it was at 125. Patient states she is feeling good, no weakness, dizziness, chest pains or headaches. Patient will continue to monitor blood sugars. Patient also reported that she is unable to afford GVOKE glucose pen. Will look into assistance for this also.  Patient was very grateful for all the help CCM services is providing to her, she mentioned the bills she has to pay and recent car issues that can make things difficult in her affording her medications. Assured patient we will continue to work with her and on her behalf to get her the best care.   GVOKE Hypopen is also offered with [employer] patient assistance program. Added this medication to application.   07/21/2021- Patient was able to sign applications and leave a copy of income documentation. Awaiting provider signature to fax.   Medications: Outpatient Encounter Medications as of 07/07/2021  Medication Sig   albuterol (VENTOLIN HFA) 108 (90 Base) MCG/ACT inhaler TAKE 2 PUFFS BY MOUTH EVERY 6 HOURS AS NEEDED FOR WHEEZE OR SHORTNESS OF BREATH (Patient taking differently: Inhale 2 puffs into the lungs every 6 (six) hours as needed for wheezing or shortness of breath.)    Ascorbic Acid (VITAMIN C) 1000 MG tablet Take 1,000 mg by mouth daily.   baclofen (LIORESAL) 20 MG tablet Take 1 tablet 3 times daily   Cholecalciferol (VITAMIN D3) 10 MCG (400 UNIT) tablet Take 400 Units by mouth daily.   Cranberry-Vitamin C-Vitamin E 4200-20-3 MG-MG-UNIT CAPS Take 1 tablet by mouth daily.   cyanocobalamin (,VITAMIN B-12,) 1000 MCG/ML injection Inject 1,000 mcg into the muscle every 3 (three) months.   dapagliflozin propanediol (FARXIGA) 5 MG TABS tablet Take 1 tablet (5 mg total) by mouth daily before breakfast.   desloratadine (CLARINEX) 5 MG tablet TAKE 1 TABLET BY MOUTH  DAILY (Patient taking differently: Take 5 mg by mouth daily.)   diazepam (VALIUM) 10 MG tablet TAKE 2 TABLETS BY MOUTH 3  TIMES DAILY as needed   DIAZEPAM, 20 MG DOSE, NA Place into the nose.   EPINEPHRINE 0.3 mg/0.3 mL IJ SOAJ injection INJECT CONTENTS OF 1 PEN AS NEEDED FOR ALLERGIC  RESPONSE AS DIRECTED BY MD. SEEK MEDICAL ATTENTION  AFTER USE. (Patient taking differently: Inject 0.3 mg into the muscle as needed for anaphylaxis.)   famotidine (PEPCID) 20 MG tablet TAKE 1 TABLET BY MOUTH TWICE A DAY   FLUoxetine (PROZAC) 10 MG capsule TAKE 1 CAPSULE BY MOUTH  DAILY (Patient taking differently: Take 10 mg by mouth at bedtime.)   gabapentin (NEURONTIN) 300 MG capsule TAKE 1 CAPSULE BY MOUTH TWO TIMES DAILY   glipiZIDE (GLUCOTROL) 5 MG tablet TAKE 1 TABLET BY MOUTH EVERY DAY AT LUNCH   Glucagon (GVOKE HYPOPEN 2-PACK) 0.5 MG/0.1ML SOAJ Inject 0.1 mLs into the skin as needed (if  blood sugar less than 70).   glucose blood test strip Use as instructed to check sugars qac and qhs   hydrOXYzine (VISTARIL) 100 MG capsule TAKE 1 CAPSULE BY MOUTH 3  TIMES DAILY AS NEEDED FOR  ITCHING (Patient taking differently: Take 100 mg by mouth 3 (three) times daily.)   insulin degludec (TRESIBA) 100 UNIT/ML FlexTouch Pen Inject 13 Units into the skin daily.   insulin isophane & regular human (NOVOLIN 70/30 FLEXPEN) (70-30) 100  UNIT/ML KwikPen Inject 10 Units into the skin daily at 12 noon. (Patient not taking: Reported on 07/05/2021)   Insulin Pen Needle 32G X 4 MM MISC 1 each by Does not apply route 2 (two) times daily. Dx E11.9 (Patient not taking: Reported on 07/05/2021)   Insulin Regular Human (NOVOLIN R FLEXPEN) 100 UNIT/ML SOPN Inject according to sliding scale 3 times a day before meals. Dx: e11.65 inject 2 units if sugars are 150 -199, 4 units 200 - 249, 6 units 250 - 299, 8 units 300 - 349, 10 units if above 350 (Patient not taking: Reported on 07/05/2021)   metFORMIN (GLUCOPHAGE) 500 MG tablet TAKE 2 TABLETS (1,000 MG TOTAL) BY MOUTH 2 (TWO) TIMES DAILY WITH A MEAL. (Patient taking differently: Take 1,000 mg by mouth 2 (two) times daily with a meal.)   Olopatadine HCl 0.7 % SOLN Place 1 drop into both eyes daily.   vitamin E 180 MG (400 UNITS) capsule Take 400 Units by mouth daily.    No facility-administered encounter medications on file as of 07/07/2021.   Care Gaps MAMMOGRAM (Yearly) OPHTHALMOLOGY EXAM (Yearly) Zoster Vaccines- Shingrix    Star Rating Drugs: Farxiga 5 mg- Last filled 06/15/2021 for 90 day supply at [facility] Metformin 500 mg- Last filled 01/24/2021 for 90 day supply at [facility]. Glipizide 5 mg- Last filled 05/06/2021 for 90 day supply at [facility]  SIG: Billee Cashing, [facility] Clinical Pharmacist Assistant [phone]

## 2021-07-08 ENCOUNTER — Ambulatory Visit (INDEPENDENT_AMBULATORY_CARE_PROVIDER_SITE_OTHER): Payer: Medicare Other

## 2021-07-08 ENCOUNTER — Ambulatory Visit (HOSPITAL_COMMUNITY)
Admission: EM | Admit: 2021-07-08 | Discharge: 2021-07-08 | Disposition: A | Discharge status: Home / Self Care | Payer: Medicare Other | Attending: Internal Medicine | Admitting: Internal Medicine

## 2021-07-08 ENCOUNTER — Other Ambulatory Visit: Payer: Self-pay

## 2021-07-08 ENCOUNTER — Ambulatory Visit: Payer: Self-pay

## 2021-07-08 ENCOUNTER — Telehealth: Payer: Medicare Other

## 2021-07-08 ENCOUNTER — Encounter (HOSPITAL_COMMUNITY): Payer: Self-pay | Admitting: Emergency Medicine

## 2021-07-08 DIAGNOSIS — W19XXXA Unspecified fall, initial encounter: Secondary | ICD-10-CM | POA: Diagnosis not present

## 2021-07-08 DIAGNOSIS — J45909 Unspecified asthma, uncomplicated: Secondary | ICD-10-CM

## 2021-07-08 DIAGNOSIS — M79641 Pain in right hand: Secondary | ICD-10-CM | POA: Diagnosis not present

## 2021-07-08 DIAGNOSIS — E118 Type 2 diabetes mellitus with unspecified complications: Secondary | ICD-10-CM

## 2021-07-08 DIAGNOSIS — S0993XA Unspecified injury of face, initial encounter: Secondary | ICD-10-CM

## 2021-07-08 DIAGNOSIS — G2582 Stiff-man syndrome: Secondary | ICD-10-CM

## 2021-07-08 DIAGNOSIS — E119 Type 2 diabetes mellitus without complications: Secondary | ICD-10-CM

## 2021-07-08 DIAGNOSIS — M25531 Pain in right wrist: Secondary | ICD-10-CM | POA: Diagnosis not present

## 2021-07-08 DIAGNOSIS — K219 Gastro-esophageal reflux disease without esophagitis: Secondary | ICD-10-CM

## 2021-07-08 DIAGNOSIS — F32A Depression, unspecified: Secondary | ICD-10-CM

## 2021-07-08 LAB — CBG MONITORING, ED: Glucose-Capillary: 247 mg/dL — ABNORMAL HIGH (ref 70–99)

## 2021-07-08 MED ORDER — PREDNISONE 10 MG PO TABS: 20.0000 mg | ORAL_TABLET | Freq: Every day | ORAL | 0 refills | Status: AC

## 2021-07-08 NOTE — ED Provider Notes (Signed)
[facility]    CSN: [id number] Arrival date & time: 07/08/21  0908      History   Chief Complaint Chief Complaint  Patient presents with   Fall   Hand Injury   Head Injury    HPI Sherri [name] is a 55 y.o. female.   HPI  Hand Pain: Pt reports that last night she felt that her sugar may have been low or she was having a spasm of her back pain.  She was on her way back from get a snack when she felt her legs get wobbly and she fell forward. She was able to place her food on a table but fell forward She reports hitting her lip on the carpet. NO head injury or LOC. She reports no nausea, vomiting, visual changes, significant headaches. She braced her fall with her right hand. Since this time she reports aching right hand pain. Worse between her wrist and thumb. NO numbness or tingling but does get a shooting pain with thumb movements.She has not tried anything for pain given her allergy to tylenol and NSAID intolerance.   Past Medical History:  Diagnosis Date   Angioedema    Anxiety    Arthritis    Asthma    Benzodiazepine withdrawal (HCC) 08/30/2012   CAP (community acquired pneumonia) 08/29/2012   Chronic back pain    Chronic kidney disease    Closed nondisplaced fracture of proximal phalanx of right little finger 10/18/2018   Cocaine abuse (HCC)    Depression    Diarrhea    DVT (deep venous thrombosis) (HCC)    Environmental allergies    Fall    GERD (gastroesophageal reflux disease)    Heart murmur    has been told once that she has a heart murmur, but has never had any problems   Hiatal hernia 03/21/2013   Lumbar herniated disc    Peripheral vascular disease (HCC)    Pernicious anemia 10/30/2011   Postconcussion syndrome 12/30/2014   Rhabdomyolysis 08/27/2012   Seasonal allergies    Stiff person syndrome    Urticaria     Patient Active Problem List   Diagnosis Date Noted   Hypoglycemia 05/05/2021   AKI (acute kidney injury) (HCC) 05/05/2021   Anxiety     Chronic rhinitis 04/24/2019   Keloid 10/10/2018   Diabetes type 2, controlled (HCC) 10/10/2018   Depression 01/02/2017   History of DVT (deep vein thrombosis) 01/02/2017   Chronic pain syndrome 01/02/2017   Cocaine abuse (HCC) 01/02/2017   Dysphagia    Adjustment insomnia    Distal radius fracture, right 08/16/2016   Postconcussion syndrome 12/30/2014   Tremor 03/11/2014   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   Hiatal hernia 03/21/2013   Anemia, iron deficiency 01/29/2013   Spasm of muscle 09/02/2012   Anemia 08/28/2012   Mild intermittent asthma 08/27/2012   Chronic back pain    Pernicious anemia 10/30/2011   DEGENERATIVE DISC DISEASE 08/18/2010   Asthma with bronchitis 07/28/2010   Recurrent urticaria 07/28/2010    Past Surgical History:  Procedure Laterality Date   BUNIONECTOMY Left    colonoscopy  07/09/15   inguinal hernia 1983  1983   OPEN REDUCTION INTERNAL FIXATION (ORIF) DISTAL RADIAL FRACTURE Right 08/16/2016   Procedure: OPEN REDUCTION INTERNAL FIXATION (ORIF) DISTAL RADIAL FRACTURE;  Surgeon: Eldred Manges, MD;  Location: [facility];  Service: Orthopedics;  Laterality: Right;    OB History     Gravida  1   Para  1  Term  1   Preterm  0   AB  0   Living  1      SAB      IAB      Ectopic      Multiple      Live Births               Home Medications    Prior to Admission medications   Medication Sig Start Date End Date Taking? Authorizing Provider  predniSONE (DELTASONE) 10 MG tablet Take 2 tablets (20 mg total) by mouth daily with breakfast for 5 days. 07/08/21 07/13/21 Yes Kahari Critzer M, PA-C  albuterol (VENTOLIN HFA) 108 (90 Base) MCG/ACT inhaler TAKE 2 PUFFS BY MOUTH EVERY 6 HOURS AS NEEDED FOR WHEEZE OR SHORTNESS OF BREATH Patient taking differently: Inhale 2 puffs into the lungs every 6 (six) hours as needed for wheezing or shortness of breath. 03/25/21   Arnette Felts, FNP  Ascorbic Acid (VITAMIN C) 1000 MG tablet Take 1,000 mg by  mouth daily.    [provider]  baclofen (LIORESAL) 20 MG tablet Take 1 tablet 3 times daily 06/21/21   Glean Salvo, NP  Cholecalciferol (VITAMIN D3) 10 MCG (400 UNIT) tablet Take 400 Units by mouth daily.    [provider]  Cranberry-Vitamin C-Vitamin E 4200-20-3 MG-MG-UNIT CAPS Take 1 tablet by mouth daily.    [provider]  cyanocobalamin (,VITAMIN B-12,) 1000 MCG/ML injection Inject 1,000 mcg into the muscle every 3 (three) months.    [provider]  dapagliflozin propanediol (FARXIGA) 5 MG TABS tablet Take 1 tablet (5 mg total) by mouth daily before breakfast. 03/01/21   Arnette Felts, FNP  desloratadine (CLARINEX) 5 MG tablet TAKE 1 TABLET BY MOUTH  DAILY Patient taking differently: Take 5 mg by mouth daily. 02/02/21   Arnette Felts, FNP  diazepam (VALIUM) 10 MG tablet TAKE 2 TABLETS BY MOUTH 3  TIMES DAILY as needed 05/06/21   Arnetha Courser, MD  DIAZEPAM, 20 MG DOSE, NA Place into the nose.    [provider]  EPINEPHRINE 0.3 mg/0.3 mL IJ SOAJ injection INJECT CONTENTS OF 1 PEN AS NEEDED FOR ALLERGIC  RESPONSE AS DIRECTED BY MD. SEEK MEDICAL ATTENTION  AFTER USE. Patient taking differently: Inject 0.3 mg into the muscle as needed for anaphylaxis. 03/01/21   Arnette Felts, FNP  famotidine (PEPCID) 20 MG tablet TAKE 1 TABLET BY MOUTH TWICE A DAY 07/06/21   Arnette Felts, FNP  FLUoxetine (PROZAC) 10 MG capsule TAKE 1 CAPSULE BY MOUTH  DAILY Patient taking differently: Take 10 mg by mouth at bedtime. 03/01/21   Glean Salvo, NP  gabapentin (NEURONTIN) 300 MG capsule TAKE 1 CAPSULE BY MOUTH TWO TIMES DAILY 06/21/21   Glean Salvo, NP  glipiZIDE (GLUCOTROL) 5 MG tablet TAKE 1 TABLET BY MOUTH EVERY DAY AT LUNCH 05/09/21   Arnette Felts, FNP  Glucagon (GVOKE HYPOPEN 2-PACK) 0.5 MG/0.1ML SOAJ Inject 0.1 mLs into the skin as needed (if blood sugar less than 70). 05/19/21   Arnette Felts, FNP  glucose blood test strip Use as instructed to check sugars qac and  qhs 05/27/21   Ghumman, Ramandeep, NP  hydrOXYzine (VISTARIL) 100 MG capsule TAKE 1 CAPSULE BY MOUTH 3  TIMES DAILY AS NEEDED FOR  ITCHING Patient taking differently: Take 100 mg by mouth 3 (three) times daily. 03/01/21   Arnette Felts, FNP  insulin degludec (TRESIBA) 100 UNIT/ML FlexTouch Pen Inject 13 Units into the skin daily.  [provider]  insulin isophane & regular human (NOVOLIN 70/30 FLEXPEN) (70-30) 100 UNIT/ML KwikPen Inject 10 Units into the skin daily at 12 noon. Patient not taking: Reported on 07/05/2021 07/01/21   Arnette FeltsMoore, Janece, FNP  Insulin Pen Needle 32G X 4 MM MISC 1 each by Does not apply route 2 (two) times daily. Dx E11.9 Patient not taking: Reported on 07/05/2021 05/27/21   Arnette FeltsMoore, Janece, FNP  Insulin Regular Human (NOVOLIN R FLEXPEN) 100 UNIT/ML SOPN Inject according to sliding scale 3 times a day before meals. Dx: e11.65 inject 2 units if sugars are 150 -199, 4 units 200 - 249, 6 units 250 - 299, 8 units 300 - 349, 10 units if above 350 Patient not taking: Reported on 07/05/2021 07/03/21   Arnette FeltsMoore, Janece, FNP  metFORMIN (GLUCOPHAGE) 500 MG tablet TAKE 2 TABLETS (1,000 MG TOTAL) BY MOUTH 2 (TWO) TIMES DAILY WITH A MEAL. Patient taking differently: Take 1,000 mg by mouth 2 (two) times daily with a meal. 01/24/21   Arnette FeltsMoore, Janece, FNP  Olopatadine HCl 0.7 % SOLN Place 1 drop into both eyes daily.    [provider]  vitamin E 180 MG (400 UNITS) capsule Take 400 Units by mouth daily.     [provider]    Family History Family History  Problem Relation Age of Onset   Cancer Mother    Other Mother    COPD Father    Asthma Brother    Cancer Brother        colon   Heart attack Brother    Seizures Brother    Allergic rhinitis Neg Hx    Angioedema Neg Hx    Eczema Neg Hx    Immunodeficiency Neg Hx    Urticaria Neg Hx     Social History Social History   Tobacco Use   Smoking status: Never   Smokeless tobacco: Never   Tobacco comments:    Never  Used Tobacco  Vaping Use   Vaping Use: Never used  Substance Use Topics   Alcohol use: Not Currently    Alcohol/week: 0.0 standard drinks    Comment: Occasional   Drug use: Not Currently    Types: Cocaine     Allergies   Ibuprofen, Lemon flavor, Amoxicillin, and Tylenol [acetaminophen]   Review of Systems Review of Systems  As stated above in HPI Physical Exam Triage Vital Signs ED Triage Vitals [07/08/21 1000]  Enc Vitals Group     BP 103/67     Pulse Rate 92     Resp 14     Temp 98.8 F (37.1 C)     Temp Source Oral     SpO2 100 %     Weight      Height      Head Circumference      Peak Flow      Pain Score 10     Pain Loc      Pain Edu?      Excl. in GC?    No data found.  Updated Vital Signs BP 103/67 (BP Location: Right Arm)   Pulse 92   Temp 98.8 F (37.1 C) (Oral)   Resp 14   LMP 07/29/2012 (Approximate)   SpO2 100%   Physical Exam Vitals and nursing note reviewed.  Cardiovascular:     Pulses: Normal pulses.  Musculoskeletal:        General: Swelling and tenderness present.       Arms:  Comments: Right wrist ROM is limited due to pain. Pain especially to side to side movements. There is a tender palpable hematoma vs ganglion cyst of the lateral right wrist. No skin breakdown.   Skin:    Capillary Refill: Capillary refill takes less than 2 seconds.     Coloration: Skin is not jaundiced or pale.     Findings: Bruising (Right lower lip) present. No erythema.  Neurological:     General: No focal deficit present.     Sensory: No sensory deficit.     Motor: No weakness.     Coordination: Coordination normal.     UC Treatments / Results  Labs (all labs ordered are listed, but only abnormal results are displayed) Labs Reviewed  CBG MONITORING, ED - Abnormal; Notable for the following components:      Result Value   Glucose-Capillary 247 (*)    All other components within normal limits    EKG   Radiology DG Hand Complete  Right  Result Date: 07/08/2021 CLINICAL DATA:  55 year old female status post fall last night. With 1st metacarpal pain EXAM: RIGHT HAND - COMPLETE 3+ VIEW COMPARISON:  Right thumb and little finger radiographs in 2018 and 2019. FINDINGS: Distal radius and ulna appear intact. Carpal bones appear intact and normally aligned. Metacarpals appear intact. Phalanges appear intact. Joint spaces are within normal limits for age. No discrete soft tissue injury. IMPRESSION: No acute fracture or dislocation identified about the right hand. Electronically Signed   By: Odessa Fleming M.D.   On: 07/08/2021 10:36    Procedures Procedures (including critical care time)  Medications Ordered in UC Medications - No data to display  Initial Impression / Assessment and Plan / UC Course  I have reviewed the triage vital signs and the nursing notes.  Pertinent labs & imaging results that were available during my care of the patient were reviewed by me and considered in my medical decision making (see chart for details).     New.  X-ray does not show any acute fracture.  This likely is a sprain of the wrist.  She would benefit from a wrist brace and cold therapy.  I am comfortable with starting her on a very low-dose of prednisone to help with her discomfort and the nerve irritation that she appears to have but she will need to monitor her sugars closely. Final Clinical Impressions(s) / UC Diagnoses   Final diagnoses:  Fall, initial encounter  Right wrist pain  Lip injury, initial encounter   Discharge Instructions   None    ED Prescriptions     Medication Sig Dispense Auth. Provider   predniSONE (DELTASONE) 10 MG tablet Take 2 tablets (20 mg total) by mouth daily with breakfast for 5 days. 10 tablet Rushie Chestnut, [place]      PDMP not reviewed this encounter.   Rushie Chestnut, [place] 07/08/21 1704

## 2021-07-08 NOTE — ED Triage Notes (Addendum)
Fall on outstretched hand yesterday, right hand/wrist injury. States "legs got wobbly" last night on her way to getting a snack after taking her insulin. Swollen lower lip. Not on blood thinners. Denies headache, dizziness, nausea now.

## 2021-07-11 ENCOUNTER — Inpatient Hospital Stay: Admission: RE | Admit: 2021-07-11 | Payer: Medicare Other | Source: Ambulatory Visit

## 2021-07-11 ENCOUNTER — Other Ambulatory Visit: Payer: Self-pay | Admitting: Nurse Practitioner

## 2021-07-12 ENCOUNTER — Telehealth (HOSPITAL_COMMUNITY): Payer: Self-pay | Admitting: Emergency Medicine

## 2021-07-12 ENCOUNTER — Ambulatory Visit: Payer: Medicare Other | Admitting: Obstetrics

## 2021-07-12 NOTE — Patient Instructions (Signed)
Goals Addressed      Diabetes treatment optimized   On track    Timeframe:  Long-Range Goal Priority:  High Start Date: 01/31/21/                            Expected End Date:  01/31/22  Follow Up Date: 08/10/21  Continue to follow DM dietary and exercise recommendations Continue to monitor CBG's daily before meals and at bedtime, alerting CCM team and or PCP of abnormal results  Work with the embedded Pharm D for evaluation of current treatment of DM and consideration for treatment change Follow up with Endocrinology as directed/referred by PCP  Pick up Farxiga samples from PCP office and start this medication as directed Follow up with the Nutritionist once referred by PCP  Increase your water intake and strictly adhere to low carb diet, especially while BS are elevated Take all of your DM medications exactly as prescribed                              

## 2021-07-12 NOTE — Chronic Care Management (AMB) (Addendum)
Chronic Care Management   CCM RN Visit Note  07/08/2021 Name: Sherri [name] MRN: [id number] DOB: 14-Aug-1966  Subjective: Sherri [name] is a 55 y.o. year old female who is a primary care patient of Minette Brine, [place]. The care management team was consulted for assistance with disease management and care coordination needs.    Engaged with patient by telephone for follow up visit in response to provider referral for case management and/or care coordination services.   Consent to Services:  The patient was given information about Chronic Care Management services, agreed to services, and gave verbal consent prior to initiation of services.  Please see initial visit note for detailed documentation.   Patient agreed to services and verbal consent obtained.   Assessment: Review of patient past medical history, allergies, medications, health status, including review of consultants reports, laboratory and other test data, was performed as part of comprehensive evaluation and provision of chronic care management services.   SDOH (Social Determinants of Health) assessments and interventions performed:  yes, no acute challenges  CCM Care Plan  Allergies  Allergen Reactions   Ibuprofen Other (See Comments)    Does not take due to hx of renal insufficiency "I have kidney disease"    Lemon Flavor Swelling    Severe Lip Swelling  FRUIT per pt.     Amoxicillin Diarrhea and Other (See Comments)   Tylenol [Acetaminophen] Hives    Cannot take large quantities    Outpatient Encounter Medications as of 07/08/2021  Medication Sig   albuterol (VENTOLIN HFA) 108 (90 Base) MCG/ACT inhaler TAKE 2 PUFFS BY MOUTH EVERY 6 HOURS AS NEEDED FOR WHEEZE OR SHORTNESS OF BREATH (Patient taking differently: Inhale 2 puffs into the lungs every 6 (six) hours as needed for wheezing or shortness of breath.)   Ascorbic Acid (VITAMIN C) 1000 MG tablet Take 1,000 mg by mouth daily.   baclofen (LIORESAL) 20 MG tablet  Take 1 tablet 3 times daily   Cholecalciferol (VITAMIN D3) 10 MCG (400 UNIT) tablet Take 400 Units by mouth daily.   Cranberry-Vitamin C-Vitamin E 4200-20-3 MG-MG-UNIT CAPS Take 1 tablet by mouth daily.   cyanocobalamin (,VITAMIN B-12,) 1000 MCG/ML injection Inject 1,000 mcg into the muscle every 3 (three) months.   dapagliflozin propanediol (FARXIGA) 5 MG TABS tablet Take 1 tablet (5 mg total) by mouth daily before breakfast.   diazepam (VALIUM) 10 MG tablet TAKE 2 TABLETS BY MOUTH 3  TIMES DAILY as needed   DIAZEPAM, 20 MG DOSE, NA Place into the nose.   famotidine (PEPCID) 20 MG tablet TAKE 1 TABLET BY MOUTH TWICE A DAY   FLUoxetine (PROZAC) 10 MG capsule TAKE 1 CAPSULE BY MOUTH  DAILY (Patient taking differently: Take 10 mg by mouth at bedtime.)   gabapentin (NEURONTIN) 300 MG capsule TAKE 1 CAPSULE BY MOUTH TWO TIMES DAILY   glipiZIDE (GLUCOTROL) 5 MG tablet TAKE 1 TABLET BY MOUTH EVERY DAY AT LUNCH   Glucagon (GVOKE HYPOPEN 2-PACK) 0.5 MG/0.1ML SOAJ Inject 0.1 mLs into the skin as needed (if blood sugar less than 70).   glucose blood test strip Use as instructed to check sugars qac and qhs   hydrOXYzine (VISTARIL) 100 MG capsule TAKE 1 CAPSULE BY MOUTH 3  TIMES DAILY AS NEEDED FOR  ITCHING (Patient taking differently: Take 100 mg by mouth 3 (three) times daily.)   insulin degludec (TRESIBA) 100 UNIT/ML FlexTouch Pen Inject 13 Units into the skin daily.   insulin isophane & regular human (NOVOLIN 70/30 FLEXPEN) (70-30)  100 UNIT/ML KwikPen Inject 10 Units into the skin daily at 12 noon. (Patient not taking: Reported on 07/05/2021)   Insulin Pen Needle 32G X 4 MM MISC 1 each by Does not apply route 2 (two) times daily. Dx E11.9 (Patient not taking: Reported on 07/05/2021)   Insulin Regular Human (NOVOLIN R FLEXPEN) 100 UNIT/ML SOPN Inject according to sliding scale 3 times a day before meals. Dx: e11.65 inject 2 units if sugars are 150 -199, 4 units 200 - 249, 6 units 250 - 299, 8 units 300 - 349,  10 units if above 350 (Patient not taking: Reported on 07/05/2021)   metFORMIN (GLUCOPHAGE) 500 MG tablet TAKE 2 TABLETS (1,000 MG TOTAL) BY MOUTH 2 (TWO) TIMES DAILY WITH A MEAL. (Patient taking differently: Take 1,000 mg by mouth 2 (two) times daily with a meal.)   Olopatadine HCl 0.7 % SOLN Place 1 drop into both eyes daily.   predniSONE (DELTASONE) 10 MG tablet Take 2 tablets (20 mg total) by mouth daily with breakfast for 5 days.   vitamin E 180 MG (400 UNITS) capsule Take 400 Units by mouth daily.    [DISCONTINUED] desloratadine (CLARINEX) 5 MG tablet TAKE 1 TABLET BY MOUTH  DAILY (Patient taking differently: Take 5 mg by mouth daily.)   [DISCONTINUED] EPINEPHRINE 0.3 mg/0.3 mL IJ SOAJ injection INJECT CONTENTS OF 1 PEN AS NEEDED FOR ALLERGIC  RESPONSE AS DIRECTED BY MD. SEEK MEDICAL ATTENTION  AFTER USE. (Patient taking differently: Inject 0.3 mg into the muscle as needed for anaphylaxis.)   No facility-administered encounter medications on file as of 07/08/2021.    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   Hiatal hernia 03/21/2013   Anemia, iron deficiency 01/29/2013   Spasm of muscle 09/02/2012   Anemia 08/28/2012   Mild intermittent asthma 08/27/2012   Chronic back pain    Pernicious anemia 10/30/2011   DEGENERATIVE DISC DISEASE 08/18/2010   Asthma with bronchitis 07/28/2010   Recurrent urticaria 07/28/2010    Conditions to be addressed/monitored:DMII, Stiff-Person  Syndrome, Asthma with bronchitis, GERD, Depression  Care Plan : Diabetes Type 2 (Adult)  Updates made by Lynne Logan, RN since 07/08/2021 12:00 AM     Problem: Glycemic Management (Diabetes, Type 2)   Priority: High     Long-Range Goal: Glycemic Management Optimized   Start Date: 01/31/2021  Expected End Date: 01/31/2022  Recent Progress: On track  Priority: High  Note:   Objective:  Lab Results  Component Value Date   HGBA1C 13.0 (H) 05/06/2021   Lab Results  Component Value Date   CREATININE 1.75 (H) 05/06/2021   CREATININE 1.89 (H) 05/05/2021   CREATININE 2.05 (H) 05/05/2021   Lab Results  Component Value Date   EGFR 73 (L) 03/22/2017   Current Barriers:  Knowledge Deficits related to basic Diabetes pathophysiology and self care/management Knowledge Deficits related to medications used for management of diabetes Difficulty obtaining or cannot afford medications Financial Constraints Limited Social Support  Case Manager Clinical Goal(s):  Patient will demonstrate improved adherence to prescribed treatment plan for diabetes self care/management as evidenced by: daily monitoring and recording of CBG  adherence to ADA/ carb modified diet exercise 5 days/week adherence to prescribed medication regimen contacting provider for new or worsened symptoms or questions Interventions:  07/08/21 completed successful inbound call with patient  Collaboration with Minette Brine, [place] regarding development and update of comprehensive plan of care as evidenced by provider attestation and co-signature Inter-disciplinary care team collaboration (see longitudinal plan of care) Received call from patient stating she had a fall at her home on the evening of 07/07/21 when she attempted to prepare food for herself Determined patient feels her blood sugar may have been low at this time as it had dropped to 75 earlier in the day Determined patient's sister was able to come over and check on her, EMS  was called and she received an assessment by the paramedic and deemed to be stable Discussed patient did injury her upper lip and her right wrist in which she is experiencing more pain and swelling this am Determined patient has decided to go to Urgent Care to have her wrist assessed for fracture Discussed plans with patient for ongoing care management follow up and provided patient with direct contact information for care management team 07/08/21 completed additional inbound call from patient Discussed and reviewed the following Assessment/Plan from [facility] urgent care:  New.  X-ray does not show any acute fracture.  This likely is a sprain of the wrist.  She would benefit from a wrist brace and cold therapy.  I am comfortable with starting her on a very low-dose of prednisone to help with her discomfort and the nerve irritation that she appears to have but she will need to monitor her sugars closely. Final Clinical Impressions(s) / UC Diagnoses    Final diagnoses:  Fall, initial encounter  Right wrist pain  Lip injury, initial encounter    Discharge Instructions   None      ED Prescriptions       Medication Sig Dispense Auth. Provider    predniSONE (DELTASONE) 10 MG tablet Take 2 tablets (20 mg total) by mouth daily with breakfast for 5 days. 10 tablet [place], Sarah M, [place]  Educated patient on use of Prednisone and potential SE of hyperglycemia; Instructed patient to monitor her cbgs more frequently while taking Prednisone and contact the CCM team and or PCP for abnormal readings   Educated on fall prevention and to notify CCM team and or PCP of any/all falls Reviewed scheduled/upcoming provider appointments including: follow up with Dr. Kelton Pillar, Endocrinology scheduled for 08/08/21 $[id number]$ :50 AM Discussed plans with patient for ongoing care management follow up and provided patient with direct contact information for care management team Self-Care Activities Self administers oral  medications as prescribed Self administers injectable DM medication Tyler Aas) as prescribed Attends all scheduled provider appointments Checks blood sugars as prescribed and utilize hyper and hypoglycemia protocol as needed Adheres to prescribed ADA/carb modified Patient Goals: - check blood sugar at prescribed times - check blood sugar before and after exercise - check blood sugar if I feel it is too high or too low - enter blood sugar readings and medication or insulin into daily log - take the blood sugar log to all doctor visits - take the blood sugar meter to all doctor visits - manage portion size - keep appointment with eye doctor - schedule appointment with eye doctor - check feet daily  for cuts, sores or redness - do heel pump exercise 2 to 3 times each day - keep feet up while sitting - trim toenails straight across - wash and dry feet carefully every day - wear comfortable, cotton socks - wear comfortable, well-fitting shoes  Follow Up Plan: Telephone follow up appointment with care management team member scheduled for: 08/10/21    Plan:Telephone follow up appointment with care management team member scheduled for:  08/10/21  Barb Merino, RN, BSN, CCM Care Management Coordinator [facility]  Direct Phone: [phone]

## 2021-07-12 NOTE — Telephone Encounter (Signed)
Returned call to pt, pt states still in pain from visit on 7/15. Pt states only took 1 dose of prednisone due to hx of diabetes. States prednisone always increases blood sugars.  Pt agreed to make appt for tomorrow to be reevaluated tomorrow at [facility] per Dr Leonides Grills.
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
## 2021-07-12 NOTE — Patient Instructions (Signed)
Goals Addressed      Diabetes treatment optimized   On track    Timeframe:  Long-Range Goal Priority:  High Start Date: 01/31/21/                            Expected End Date:  01/31/22  Follow Up Date: 08/10/21  Continue to follow DM dietary and exercise recommendations Continue to monitor CBG's daily before meals and at bedtime, alerting CCM team and or PCP of abnormal results  Work with the embedded Pharm D for evaluation of current treatment of DM and consideration for treatment change Follow up with Endocrinology as directed/referred by PCP  Pick up Farxiga samples from PCP office and start this medication as directed Follow up with the Nutritionist once referred by PCP  Increase your water intake and strictly adhere to low carb diet, especially while BS are elevated Take all of your DM medications exactly as prescribed

## 2021-07-13 ENCOUNTER — Ambulatory Visit (HOSPITAL_COMMUNITY): Payer: Medicare Other

## 2021-07-13 ENCOUNTER — Ambulatory Visit: Payer: Self-pay

## 2021-07-13 ENCOUNTER — Telehealth: Payer: Medicare Other

## 2021-07-13 DIAGNOSIS — J45909 Unspecified asthma, uncomplicated: Secondary | ICD-10-CM

## 2021-07-13 DIAGNOSIS — K219 Gastro-esophageal reflux disease without esophagitis: Secondary | ICD-10-CM

## 2021-07-13 DIAGNOSIS — F32A Depression, unspecified: Secondary | ICD-10-CM

## 2021-07-13 DIAGNOSIS — E119 Type 2 diabetes mellitus without complications: Secondary | ICD-10-CM

## 2021-07-13 DIAGNOSIS — G2582 Stiff-man syndrome: Secondary | ICD-10-CM

## 2021-07-14 ENCOUNTER — Ambulatory Visit (INDEPENDENT_AMBULATORY_CARE_PROVIDER_SITE_OTHER): Payer: Medicare Other

## 2021-07-14 ENCOUNTER — Ambulatory Visit: Payer: Medicare Other | Admitting: Obstetrics

## 2021-07-14 VITALS — Ht 63.0 in | Wt 92.6 lb

## 2021-07-14 DIAGNOSIS — Z0000 Encounter for general adult medical examination without abnormal findings: Secondary | ICD-10-CM | POA: Diagnosis not present

## 2021-07-14 NOTE — Patient Instructions (Signed)
[name] , Thank you for taking time to come for your Medicare Wellness Visit. I appreciate your ongoing commitment to your health goals. Please review the following plan we discussed and let me know if I can assist you in the future.   Screening recommendations/referrals: Colonoscopy: completed 10/23/2014 Mammogram: scheduled Bone Density: n/a Recommended yearly ophthalmology/optometry visit for glaucoma screening and checkup Recommended yearly dental visit for hygiene and checkup  Vaccinations: Influenza vaccine: decline Pneumococcal vaccine: decline Tdap vaccine: completed 04/25/2016, due 04/25/2026 Shingles vaccine: discussed   Covid-19: decline  Advanced directives: Advance directive discussed with you today.   Conditions/risks identified: none  Next appointment: Follow up in one year for your annual wellness visit.   Preventive Care 40-64 Years, Female Preventive care refers to lifestyle choices and visits with your health care provider that can promote health and wellness. What does preventive care include? A yearly physical exam. This is also called an annual well check. Dental exams once or twice a year. Routine eye exams. Ask your health care provider how often you should have your eyes checked. Personal lifestyle choices, including: Daily care of your teeth and gums. Regular physical activity. Eating a healthy diet. Avoiding tobacco and drug use. Limiting alcohol use. Practicing safe sex. Taking low-dose aspirin daily starting at age 81. Taking vitamin and mineral supplements as recommended by your health care provider. What happens during an annual well check? The services and screenings done by your health care provider during your annual well check will depend on your age, overall health, lifestyle risk factors, and family history of disease. Counseling  Your health care provider may ask you questions about your: Alcohol use. Tobacco use. Drug use. Emotional  well-being. Home and relationship well-being. Sexual activity. Eating habits. Work and work Statistician. Method of birth control. Menstrual cycle. Pregnancy history. Screening  You may have the following tests or measurements: Height, weight, and BMI. Blood pressure. Lipid and cholesterol levels. These may be checked every 5 years, or more frequently if you are over 35 years old. Skin check. Lung cancer screening. You may have this screening every year starting at age 30 if you have a 30-pack-year history of smoking and currently smoke or have quit within the past 15 years. Fecal occult blood test (FOBT) of the stool. You may have this test every year starting at age 25. Flexible sigmoidoscopy or colonoscopy. You may have a sigmoidoscopy every 5 years or a colonoscopy every 10 years starting at age 78. Hepatitis C blood test. Hepatitis B blood test. Sexually transmitted disease (STD) testing. Diabetes screening. This is done by checking your blood sugar (glucose) after you have not eaten for a while (fasting). You may have this done every 1-3 years. Mammogram. This may be done every 1-2 years. Talk to your health care provider about when you should start having regular mammograms. This may depend on whether you have a family history of breast cancer. BRCA-related cancer screening. This may be done if you have a family history of breast, ovarian, tubal, or peritoneal cancers. Pelvic exam and Pap test. This may be done every 3 years starting at age 35. Starting at age 48, this may be done every 5 years if you have a Pap test in combination with an HPV test. Bone density scan. This is done to screen for osteoporosis. You may have this scan if you are at high risk for osteoporosis. Discuss your test results, treatment options, and if necessary, the need for more tests with your  health care provider. Vaccines  Your health care provider may recommend certain vaccines, such as: Influenza  vaccine. This is recommended every year. Tetanus, diphtheria, and acellular pertussis (Tdap, Td) vaccine. You may need a Td booster every 10 years. Zoster vaccine. You may need this after age 21. Pneumococcal 13-valent conjugate (PCV13) vaccine. You may need this if you have certain conditions and were not previously vaccinated. Pneumococcal polysaccharide (PPSV23) vaccine. You may need one or two doses if you smoke cigarettes or if you have certain conditions. Talk to your health care provider about which screenings and vaccines you need and how often you need them. This information is not intended to replace advice given to you by your health care provider. Make sure you discuss any questions you have with your health care provider. Document Released: 01/07/2016 Document Revised: 08/30/2016 Document Reviewed: 10/12/2015 Elsevier Interactive Patient Education  2017 [place] Prevention in the Home Falls can cause injuries. They can happen to people of all ages. There are many things you can do to make your home safe and to help prevent falls. What can I do on the outside of my home? Regularly fix the edges of walkways and driveways and fix any cracks. Remove anything that might make you trip as you walk through a door, such as a raised step or threshold. Trim any bushes or trees on the path to your home. Use bright outdoor lighting. Clear any walking paths of anything that might make someone trip, such as rocks or tools. Regularly check to see if handrails are loose or broken. Make sure that both sides of any steps have handrails. Any raised decks and porches should have guardrails on the edges. Have any leaves, snow, or ice cleared regularly. Use sand or salt on walking paths during winter. Clean up any spills in your garage right away. This includes oil or grease spills. What can I do in the bathroom? Use night lights. Install grab bars by the toilet and in the tub and  shower. Do not use towel bars as grab bars. Use non-skid mats or decals in the tub or shower. If you need to sit down in the shower, use a plastic, non-slip stool. Keep the floor dry. Clean up any water that spills on the floor as soon as it happens. Remove soap buildup in the tub or shower regularly. Attach bath mats securely with double-sided non-slip rug tape. Do not have throw rugs and other things on the floor that can make you trip. What can I do in the bedroom? Use night lights. Make sure that you have a light by your bed that is easy to reach. Do not use any sheets or blankets that are too big for your bed. They should not hang down onto the floor. Have a firm chair that has side arms. You can use this for support while you get dressed. Do not have throw rugs and other things on the floor that can make you trip. What can I do in the kitchen? Clean up any spills right away. Avoid walking on wet floors. Keep items that you use a lot in easy-to-reach places. If you need to reach something above you, use a strong step stool that has a grab bar. Keep electrical cords out of the way. Do not use floor polish or wax that makes floors slippery. If you must use wax, use non-skid floor wax. Do not have throw rugs and other things on the floor that  can make you trip. What can I do with my stairs? Do not leave any items on the stairs. Make sure that there are handrails on both sides of the stairs and use them. Fix handrails that are broken or loose. Make sure that handrails are as long as the stairways. Check any carpeting to make sure that it is firmly attached to the stairs. Fix any carpet that is loose or worn. Avoid having throw rugs at the top or bottom of the stairs. If you do have throw rugs, attach them to the floor with carpet tape. Make sure that you have a light switch at the top of the stairs and the bottom of the stairs. If you do not have them, ask someone to add them for  you. What else can I do to help prevent falls? Wear shoes that: Do not have high heels. Have rubber bottoms. Are comfortable and fit you well. Are closed at the toe. Do not wear sandals. If you use a stepladder: Make sure that it is fully opened. Do not climb a closed stepladder. Make sure that both sides of the stepladder are locked into place. Ask someone to hold it for you, if possible. Clearly mark and make sure that you can see: Any grab bars or handrails. First and last steps. Where the edge of each step is. Use tools that help you move around (mobility aids) if they are needed. These include: Canes. Walkers. Scooters. Crutches. Turn on the lights when you go into a dark area. Replace any light bulbs as soon as they burn out. Set up your furniture so you have a clear path. Avoid moving your furniture around. If any of your floors are uneven, fix them. If there are any pets around you, be aware of where they are. Review your medicines with your doctor. Some medicines can make you feel dizzy. This can increase your chance of falling. Ask your doctor what other things that you can do to help prevent falls. This information is not intended to replace advice given to you by your health care provider. Make sure you discuss any questions you have with your health care provider. Document Released: 10/07/2009 Document Revised: 05/18/2016 Document Reviewed: 01/15/2015 Elsevier Interactive Patient Education  2017 [employer].

## 2021-07-14 NOTE — Progress Notes (Signed)
I connected with Sherri [name] today by telephone and verified that I am speaking with the correct person using two identifiers. Location patient: home Location provider: work Persons participating in the virtual visit: [name], [name] LPN.   I discussed the limitations, risks, security and privacy concerns of performing an evaluation and management service by telephone and the availability of in person appointments. I also discussed with the patient that there may be a patient responsible charge related to this service. The patient expressed understanding and verbally consented to this telephonic visit.    Interactive audio and video telecommunications were attempted between this provider and patient, however failed, due to patient having technical difficulties OR patient did not have access to video capability.  We continued and completed visit with audio only.     Vital signs may be patient reported or missing.  Subjective:   Sherri [name] is a 55 y.o. female who presents for Medicare Annual (Subsequent) preventive examination.  Review of Systems     Cardiac Risk Factors include: diabetes mellitus;sedentary lifestyle     Objective:    Today's Vitals   07/14/21 1202  Weight: 92 lb 9.6 oz (42 kg)  Height: 5\' 3"  (1.6 m)   Body mass index is 16.4 kg/m.  Advanced Directives 07/14/2021 05/05/2021 12/19/2020 11/16/2020 07/27/2020 06/03/2020 03/10/2020  Does Patient Have a Medical Advance Directive? No No No No No No No  Would patient like information on creating a medical advance directive? - No - Guardian declined - No - Patient declined No - Patient declined No - Patient declined No - Patient declined  Pre-existing out of facility DNR order (yellow form or pink MOST form) - - - - - - -    Current Medications (verified) Outpatient Encounter Medications as of 07/14/2021  Medication Sig   albuterol (VENTOLIN HFA) 108 (90 Base) MCG/ACT inhaler TAKE 2 PUFFS BY MOUTH  EVERY 6 HOURS AS NEEDED FOR WHEEZE OR SHORTNESS OF BREATH (Patient taking differently: Inhale 2 puffs into the lungs every 6 (six) hours as needed for wheezing or shortness of breath.)   Ascorbic Acid (VITAMIN C) 1000 MG tablet Take 1,000 mg by mouth daily.   baclofen (LIORESAL) 20 MG tablet Take 1 tablet 3 times daily   Cholecalciferol (VITAMIN D3) 10 MCG (400 UNIT) tablet Take 400 Units by mouth daily.   Cranberry-Vitamin C-Vitamin E 4200-20-3 MG-MG-UNIT CAPS Take 1 tablet by mouth daily.   cyanocobalamin (,VITAMIN B-12,) 1000 MCG/ML injection Inject 1,000 mcg into the muscle every 3 (three) months.   dapagliflozin propanediol (FARXIGA) 5 MG TABS tablet Take 1 tablet (5 mg total) by mouth daily before breakfast.   desloratadine (CLARINEX) 5 MG tablet TAKE 1 TABLET BY MOUTH  DAILY   diazepam (VALIUM) 10 MG tablet TAKE 2 TABLETS BY MOUTH 3  TIMES DAILY as needed   DIAZEPAM, 20 MG DOSE, NA Place into the nose.   EPINEPHRINE 0.3 mg/0.3 mL IJ SOAJ injection INJECT INTRAMUSCULARLY 1  PEN AS NEEDED FOR ALLERGIC  RESPONSE AS DIRECTED BY MD. SEEK MEDICAL HELP AFTER  USE.   famotidine (PEPCID) 20 MG tablet TAKE 1 TABLET BY MOUTH TWICE A DAY   FLUoxetine (PROZAC) 10 MG capsule TAKE 1 CAPSULE BY MOUTH  DAILY (Patient taking differently: Take 10 mg by mouth at bedtime.)   gabapentin (NEURONTIN) 300 MG capsule TAKE 1 CAPSULE BY MOUTH TWO TIMES DAILY   glipiZIDE (GLUCOTROL) 5 MG tablet TAKE 1 TABLET BY MOUTH EVERY DAY AT LUNCH   glucose  blood test strip Use as instructed to check sugars qac and qhs   hydrOXYzine (VISTARIL) 100 MG capsule TAKE 1 CAPSULE BY MOUTH 3  TIMES DAILY AS NEEDED FOR  ITCHING (Patient taking differently: Take 100 mg by mouth 3 (three) times daily.)   insulin degludec (TRESIBA) 100 UNIT/ML FlexTouch Pen Inject 13 Units into the skin daily.   Insulin Pen Needle 32G X 4 MM MISC 1 each by Does not apply route 2 (two) times daily. Dx E11.9   metFORMIN (GLUCOPHAGE) 500 MG tablet TAKE 2 TABLETS  (1,000 MG TOTAL) BY MOUTH 2 (TWO) TIMES DAILY WITH A MEAL. (Patient taking differently: Take 1,000 mg by mouth 2 (two) times daily with a meal.)   Olopatadine HCl 0.7 % SOLN Place 1 drop into both eyes daily.   vitamin E 180 MG (400 UNITS) capsule Take 400 Units by mouth daily.    Glucagon (GVOKE HYPOPEN 2-PACK) 0.5 MG/0.1ML SOAJ Inject 0.1 mLs into the skin as needed (if blood sugar less than 70). (Patient not taking: Reported on 07/14/2021)   insulin isophane & regular human (NOVOLIN 70/30 FLEXPEN) (70-30) 100 UNIT/ML KwikPen Inject 10 Units into the skin daily at 12 noon. (Patient not taking: No sig reported)   Insulin Regular Human (NOVOLIN R FLEXPEN) 100 UNIT/ML SOPN Inject according to sliding scale 3 times a day before meals. Dx: e11.65 inject 2 units if sugars are 150 -199, 4 units 200 - 249, 6 units 250 - 299, 8 units 300 - 349, 10 units if above 350 (Patient not taking: No sig reported)   No facility-administered encounter medications on file as of 07/14/2021.    Allergies (verified) Ibuprofen, Lemon flavor, Amoxicillin, and Tylenol [acetaminophen]   History: Past Medical History:  Diagnosis Date   Angioedema    Anxiety    Arthritis    Asthma    Benzodiazepine withdrawal (HCC) 08/30/2012   CAP (community acquired pneumonia) 08/29/2012   Chronic back pain    Chronic kidney disease    Closed nondisplaced fracture of proximal phalanx of right little finger 10/18/2018   Cocaine abuse (HCC)    Depression    Diarrhea    DVT (deep venous thrombosis) (HCC)    Environmental allergies    Fall    GERD (gastroesophageal reflux disease)    Heart murmur    has been told once that she has a heart murmur, but has never had any problems   Hiatal hernia 03/21/2013   Lumbar herniated disc    Peripheral vascular disease (HCC)    Pernicious anemia 10/30/2011   Postconcussion syndrome 12/30/2014   Rhabdomyolysis 08/27/2012   Seasonal allergies    Stiff person syndrome    Urticaria    Past  Surgical History:  Procedure Laterality Date   BUNIONECTOMY Left    colonoscopy  07/09/15   inguinal hernia 1983  1983   OPEN REDUCTION INTERNAL FIXATION (ORIF) DISTAL RADIAL FRACTURE Right 08/16/2016   Procedure: OPEN REDUCTION INTERNAL FIXATION (ORIF) DISTAL RADIAL FRACTURE;  Surgeon: Eldred Manges, MD;  Location: [facility] OR;  Service: Orthopedics;  Laterality: Right;   Family History  Problem Relation Age of Onset   Cancer Mother    Other Mother    COPD Father    Asthma Brother    Cancer Brother        colon   Heart attack Brother    Seizures Brother    Allergic rhinitis Neg Hx    Angioedema Neg Hx    Eczema Neg Hx  Immunodeficiency Neg Hx    Urticaria Neg Hx    Social History   Socioeconomic History   Marital status: Divorced    Spouse name: Not on file   Number of children: 1   Years of education: college   Highest education level: Not on file  Occupational History   Occupation: disabled  Tobacco Use   Smoking status: Never   Smokeless tobacco: Never   Tobacco comments:    Never Used Tobacco  Vaping Use   Vaping Use: Never used  Substance and Sexual Activity   Alcohol use: Not Currently    Alcohol/week: 0.0 standard drinks    Comment: Occasional   Drug use: Not Currently    Types: Cocaine   Sexual activity: Yes    Partners: Male  Other Topics Concern   Not on file  Social History Narrative   Patient is right and left handed.   Patient drinks some caffeine occasionally.   Social Determinants of Health   Financial Resource Strain: High Risk   Difficulty of Paying Living Expenses: Very hard  Food Insecurity: Food Insecurity Present   Worried About Programme researcher, broadcasting/film/video in the Last Year: Often true   Barista in the Last Year: Often true  Transportation Needs: No Transportation Needs   Lack of Transportation (Medical): No   Lack of Transportation (Non-Medical): No  Physical Activity: Inactive   Days of Exercise per Week: 0 days   Minutes of Exercise  per Session: 0 min  Stress: Not on file  Social Connections: Not on file    Tobacco Counseling Counseling given: Not Answered Tobacco comments: Never Used Tobacco   Clinical Intake:  Pre-visit preparation completed: Yes  Pain : No/denies pain     Nutritional Status: BMI <19  Underweight Nutritional Risks: Nausea/ vomitting/ diarrhea (a little diarrhea) Diabetes: Yes  How often do you need to have someone help you when you read instructions, pamphlets, or other written materials from your doctor or pharmacy?: 1 - Never What is the last grade level you completed in school?: some college  Diabetic? Yes Nutrition Risk Assessment:  Has the patient had any N/V/D within the last 2 months?  Yes  Does the patient have any non-healing wounds?  No  Has the patient had any unintentional weight loss or weight gain?  No   Diabetes:  Is the patient diabetic?  Yes  If diabetic, was a CBG obtained today?  No  Did the patient bring in their glucometer from home?  No  How often do you monitor your CBG's? 2-3 daily.   Financial Strains and Diabetes Management:  Are you having any financial strains with the device, your supplies or your medication? Yes .  Does the patient want to be seen by Chronic Care Management for management of their diabetes?  Yes  Would the patient like to be referred to a Nutritionist or for Diabetic Management?  Yes   Diabetic Exams:  Diabetic Eye Exam: Overdue for diabetic eye exam. Pt has been advised about the importance in completing this exam. Patient advised to call and schedule an eye exam. Diabetic Foot Exam: Completed 01/25/2021   Interpreter Needed?: No  Information entered by :: NAllen LPN   Activities of Daily Living In your present state of health, do you have any difficulty performing the following activities: 07/14/2021 05/05/2021  Hearing? N N  Vision? N N  Difficulty concentrating or making decisions? N Y  Walking or climbing stairs? [place]  Dressing or bathing? N Y  Doing errands, shopping? N N  Preparing Food and eating ? N -  Using the Toilet? N -  In the past six months, have you accidently leaked urine? N -  Do you have problems with loss of bowel control? N -  Managing your Medications? N -  Managing your Finances? N -  Housekeeping or managing your Housekeeping? N -  Some recent data might be hidden    Patient Care Team: Arnette Felts, FNP as PCP - General (General Practice) Little, [name], RN as [facility] Care Management [place], Enrique Sack as [facility] Therapist, music  Indicate any recent Medical Services you may have received from other than [facility] providers in the past year (date may be approximate).     Assessment:   This is a routine wellness examination for [facility]  Hearing/Vision screen Vision Screening - Comments:: No regular eye exams,   Dietary issues and exercise activities discussed: Current Exercise Habits: The patient does not participate in regular exercise at present   Goals Addressed             This Visit's Progress    Patient Stated       07/14/2021, get better and stop worrying, eat healthier and exercise       Depression Screen PHQ 2/9 Scores 07/14/2021 06/03/2020 02/16/2020 01/15/2020 12/10/2019 04/22/2019 04/22/2019  PHQ - 2 Score 0 0 0 0 0 0 0  PHQ- 9 Score - 0 - 0 - 0 -  Exception Documentation - Other- indicate reason in comment box - - - - -  Not completed - Just completed with CMA - - - - -    Fall Risk Fall Risk  07/14/2021 06/03/2020 06/03/2020 02/16/2020 01/15/2020  Falls in the past year? 0 0  Comment sugar got low slid out of bed - - -  Number falls in past yr: 1 0 1 - -  Comment - - - - -  Injury with Fall? 1 0 - - -  Comment busted lip and sprained wrist - - - -  Risk Factor Category  - - - - -  Risk for fall due to : History of fall(s);Medication side effect History of fall(s);Medication side effect - - -  Risk for fall due to:  Comment - - - - -  Follow up Falls evaluation completed;Education provided;Falls prevention discussed Falls evaluation completed;Education provided;Falls prevention discussed - - -    FALL RISK PREVENTION PERTAINING TO THE HOME:  Any stairs in or around the home? No  If so, are there any without handrails?  N/a Home free of loose throw rugs in walkways, pet beds, electrical cords, etc? Yes  Adequate lighting in your home to reduce risk of falls? Yes   ASSISTIVE DEVICES UTILIZED TO PREVENT FALLS:  Life alert? No  Use of a cane, walker or w/c? Yes  Grab bars in the bathroom? Yes  Shower chair or bench in shower? No  Elevated toilet seat or a handicapped toilet? No   TIMED UP AND GO:  Was the test performed? No .      Cognitive Function:     6CIT Screen 07/14/2021 06/03/2020 04/22/2019  What Year? 0 points 0 points 0 points  What month? 0 points 0 points 0 points  What time? 0 points 0 points 0 points  Count back from 20 0 points 0 points 0 points  Months in reverse 0 points 0 points 0  points  Repeat phrase 0 points 0 points 0 points  Total Score 0 0 0    Immunizations Immunization History  Administered Date(s) Administered   Tdap 04/25/2016    TDAP status: Up to date  Flu Vaccine status: Declined, Education has been provided regarding the importance of this vaccine but patient still declined. Advised may receive this vaccine at local pharmacy or Health Dept. Aware to provide a copy of the vaccination record if obtained from local pharmacy or Health Dept. Verbalized acceptance and understanding.  Pneumococcal vaccine status: Declined,  Education has been provided regarding the importance of this vaccine but patient still declined. Advised may receive this vaccine at local pharmacy or Health Dept. Aware to provide a copy of the vaccination record if obtained from local pharmacy or Health Dept. Verbalized acceptance and understanding.   Covid-19 vaccine status: Declined,  Education has been provided regarding the importance of this vaccine but patient still declined. Advised may receive this vaccine at local pharmacy or Health Dept.or vaccine clinic. Aware to provide a copy of the vaccination record if obtained from local pharmacy or Health Dept. Verbalized acceptance and understanding.  Qualifies for Shingles Vaccine? Yes   Zostavax completed No   Shingrix Completed?: No.    Education has been provided regarding the importance of this vaccine. Patient has been advised to call insurance company to determine out of pocket expense if they have not yet received this vaccine. Advised may also receive vaccine at local pharmacy or Health Dept. Verbalized acceptance and understanding.  Screening Tests Health Maintenance  Topic Date Due   OPHTHALMOLOGY EXAM  Never done   Zoster Vaccines- Shingrix (1 of 2) Never done   MAMMOGRAM  09/10/2019   COVID-19 Vaccine (1) 07/30/2021 (Originally 09/03/1971)   Pneumococcal Vaccine 230-57 Years old (1 - PCV) 07/14/2022 (Originally 09/03/1972)   PNEUMOCOCCAL POLYSACCHARIDE VACCINE AGE 75-64 HIGH RISK  07/14/2022 (Originally 09/03/1968)   INFLUENZA VACCINE  07/25/2021   HEMOGLOBIN A1C  11/06/2021   FOOT EXAM  01/25/2022   URINE MICROALBUMIN  03/22/2022   PAP SMEAR-Modifier  07/01/2023   COLONOSCOPY (Pts 45-4428yrs Insurance coverage will need to be confirmed)  10/23/2024   TETANUS/TDAP  04/25/2026   Hepatitis C Screening  Completed   HIV Screening  Completed   HPV VACCINES  Aged Out    Health Maintenance  Health Maintenance Due  Topic Date Due   OPHTHALMOLOGY EXAM  Never done   Zoster Vaccines- Shingrix (1 of 2) Never done   MAMMOGRAM  09/10/2019    Colorectal cancer screening: Type of screening: Colonoscopy. Completed 10/23/2014. Repeat every 10 years  Mammogram status: has one scheduled soon  Bone Density status: no  Lung Cancer Screening: (Low Dose CT Chest recommended if Age 68-80 years, 30 pack-year currently smoking  OR have quit w/in 15years.) does not qualify.   Lung Cancer Screening Referral: no  Additional Screening:  Hepatitis C Screening: does qualify; Completed 06/30/2020  Vision Screening: Recommended annual ophthalmology exams for early detection of glaucoma and other disorders of the eye. Is the patient up to date with their annual eye exam?  No  Who is the provider or what is the name of the office in which the patient attends annual eye exams? Can't remember name If pt is not established with a provider, would they like to be referred to a provider to establish care? No .   Dental Screening: Recommended annual dental exams for proper oral hygiene  Community Resource Referral / Chronic Care Management:  CRR required this visit?  No   CCM required this visit?  No      Plan:     I have personally reviewed and noted the following in the patient's chart:   Medical and social history Use of alcohol, tobacco or illicit drugs  Current medications and supplements including opioid prescriptions.  Functional ability and status Nutritional status Physical activity Advanced directives List of other physicians Hospitalizations, surgeries, and ER visits in previous 12 months Vitals Screenings to include cognitive, depression, and falls Referrals and appointments  In addition, I have reviewed and discussed with patient certain preventive protocols, quality metrics, and best practice recommendations. A written personalized care plan for preventive services as well as general preventive health recommendations were provided to patient.     Barb Merino, LPN   [id number]   Nurse Notes: May have difficulty with transportation and is having trouble with food. States that she spoke with Nurse case manager yesterday. Nurse case manager states that a care guide should be calling patient today or tomorrow.

## 2021-07-15 ENCOUNTER — Telehealth: Payer: Self-pay | Admitting: Nurse Practitioner

## 2021-07-15 NOTE — Telephone Encounter (Signed)
   Telephone encounter was:  Successful.  07/15/2021 Name: [name] MRN: [id number] DOB: Dec 13, 1966  Sherri [name] is a 55 y.o. year old female who is a primary care patient of Arnette Felts, FNP . The community resource team was consulted for assistance with Transportation Needs   Care guide performed the following interventions: Patient provided with information about care guide support team and interviewed to confirm resource needs I advised pt I will research transportation resources and get back in touch with her .  Follow Up Plan:  Care guide will follow up with patient by phone over the next week.

## 2021-07-17 ENCOUNTER — Other Ambulatory Visit: Payer: Self-pay

## 2021-07-17 ENCOUNTER — Ambulatory Visit (HOSPITAL_COMMUNITY)
Admission: RE | Admit: 2021-07-17 | Discharge: 2021-07-17 | Disposition: A | Discharge status: Home / Self Care | Payer: Medicare Other | Source: Ambulatory Visit | Attending: Nurse Practitioner | Admitting: Nurse Practitioner

## 2021-07-17 ENCOUNTER — Encounter (HOSPITAL_COMMUNITY): Payer: Self-pay

## 2021-07-17 VITALS — BP 95/67 | HR 89 | Temp 98.4°F | Resp 16

## 2021-07-17 DIAGNOSIS — S63501A Unspecified sprain of right wrist, initial encounter: Secondary | ICD-10-CM

## 2021-07-17 NOTE — Discharge Instructions (Addendum)
Continue taking care of your wrist.   Rest as much as possible Ice for 10-15 minutes every 4-6 hours as needed for pain and swelling Compression- use an ace bandage or splint for comfort Elevate above your hip/heart when sitting and laying down  Follow up with Dr. Ophelia Charter if symptoms do not improve.

## 2021-07-17 NOTE — ED Triage Notes (Signed)
Pt presents for follow-up from visit on 7/15. States is still having pain that comes and goes in the wrist and is unable to put any weight on wrist.

## 2021-07-17 NOTE — ED Notes (Addendum)
Pt presents for follow-up from 7/15 visit. States pain in wrist come and goes, unable to put wait on wrist.

## 2021-07-17 NOTE — ED Provider Notes (Signed)
[facility]    CSN: [id number] Arrival date & time: 07/17/21  1635      History   Chief Complaint Chief Complaint  Patient presents with   Wrist Pain    Right    HPI Sherri [name] is a 55 y.o. female.   Patient here for re-evaluation of right wrist pain.  Patient was initially seen for a fall approximately 1.5 weeks ago.  X-rays at that time were negative for any fractures.  Patient given wrist splints and prednisone to help with inflammation.  Patient reports that she did not take prednisone due to her history of diabetes.  Patient is unable to take ibuprofen, naproxen, or Tylenol for pain.  Patient does report icing rest. Denies any fevers, chest pain, shortness of breath, N/V/D, numbness, tingling, weakness, abdominal pain, or headaches.     The history is provided by the patient.  Wrist Pain   Past Medical History:  Diagnosis Date   Angioedema    Anxiety    Arthritis    Asthma    Benzodiazepine withdrawal (HCC) 08/30/2012   CAP (community acquired pneumonia) 08/29/2012   Chronic back pain    Chronic kidney disease    Closed nondisplaced fracture of proximal phalanx of right little finger 10/18/2018   Cocaine abuse (HCC)    Depression    Diarrhea    DVT (deep venous thrombosis) (HCC)    Environmental allergies    Fall    GERD (gastroesophageal reflux disease)    Heart murmur    has been told once that she has a heart murmur, but has never had any problems   Hiatal hernia 03/21/2013   Lumbar herniated disc    Peripheral vascular disease (HCC)    Pernicious anemia 10/30/2011   Postconcussion syndrome 12/30/2014   Rhabdomyolysis 08/27/2012   Seasonal allergies    Stiff person syndrome    Urticaria     Patient Active Problem List   Diagnosis Date Noted   Hypoglycemia 05/05/2021   AKI (acute kidney injury) (HCC) 05/05/2021   Anxiety    Chronic rhinitis 04/24/2019   Keloid 10/10/2018   Diabetes type 2, controlled (HCC) 10/10/2018   Depression  01/02/2017   History of DVT (deep vein thrombosis) 01/02/2017   Chronic pain syndrome 01/02/2017   Cocaine abuse (HCC) 01/02/2017   Dysphagia    Adjustment insomnia    Distal radius fracture, right 08/16/2016   Postconcussion syndrome 12/30/2014   Tremor 03/11/2014   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   Hiatal hernia 03/21/2013   Anemia, iron deficiency 01/29/2013   Spasm of muscle 09/02/2012   Anemia 08/28/2012   Mild intermittent asthma 08/27/2012   Chronic back pain    Pernicious anemia 10/30/2011   DEGENERATIVE DISC DISEASE 08/18/2010   Asthma with bronchitis 07/28/2010   Recurrent urticaria 07/28/2010    Past Surgical History:  Procedure Laterality Date   BUNIONECTOMY Left    colonoscopy  07/09/15   inguinal hernia 1983  1983   OPEN REDUCTION INTERNAL FIXATION (ORIF) DISTAL RADIAL FRACTURE Right 08/16/2016   Procedure: OPEN REDUCTION INTERNAL FIXATION (ORIF) DISTAL RADIAL FRACTURE;  Surgeon: Eldred Manges, MD;  Location: [facility];  Service: Orthopedics;  Laterality: Right;    OB History     Gravida  1   Para  1   Term  1   Preterm  0   AB  0   Living  1      SAB      IAB  Ectopic      Multiple      Live Births               Home Medications    Prior to Admission medications   Medication Sig Start Date End Date Taking? Authorizing Provider  albuterol (VENTOLIN HFA) 108 (90 Base) MCG/ACT inhaler TAKE 2 PUFFS BY MOUTH EVERY 6 HOURS AS NEEDED FOR WHEEZE OR SHORTNESS OF BREATH Patient taking differently: Inhale 2 puffs into the lungs every 6 (six) hours as needed for wheezing or shortness of breath. 03/25/21   Arnette FeltsMoore, Janece, FNP  Ascorbic Acid (VITAMIN C) 1000 MG tablet Take 1,000 mg by mouth daily.    [provider]  baclofen (LIORESAL) 20 MG tablet Take 1 tablet 3 times daily 06/21/21   Glean SalvoSlack, Sarah J, NP  Cholecalciferol (VITAMIN D3) 10 MCG (400 UNIT) tablet Take 400 Units by mouth daily.    [provider]  Cranberry-Vitamin  C-Vitamin E 4200-20-3 MG-MG-UNIT CAPS Take 1 tablet by mouth daily.    [provider]  cyanocobalamin (,VITAMIN B-12,) 1000 MCG/ML injection Inject 1,000 mcg into the muscle every 3 (three) months.    [provider]  dapagliflozin propanediol (FARXIGA) 5 MG TABS tablet Take 1 tablet (5 mg total) by mouth daily before breakfast. 03/01/21   Arnette FeltsMoore, Janece, FNP  desloratadine (CLARINEX) 5 MG tablet TAKE 1 TABLET BY MOUTH  DAILY 07/11/21   Arnette FeltsMoore, Janece, FNP  diazepam (VALIUM) 10 MG tablet TAKE 2 TABLETS BY MOUTH 3  TIMES DAILY as needed 05/06/21   Arnetha CourserAmin, Sumayya, MD  DIAZEPAM, 20 MG DOSE, NA Place into the nose.    [provider]  EPINEPHRINE 0.3 mg/0.3 mL IJ SOAJ injection INJECT INTRAMUSCULARLY 1  PEN AS NEEDED FOR ALLERGIC  RESPONSE AS DIRECTED BY MD. Assunta FoundSEEK MEDICAL HELP AFTER  USE. 07/11/21   Arnette FeltsMoore, Janece, FNP  famotidine (PEPCID) 20 MG tablet TAKE 1 TABLET BY MOUTH TWICE A DAY 07/06/21   Arnette FeltsMoore, Janece, FNP  FLUoxetine (PROZAC) 10 MG capsule TAKE 1 CAPSULE BY MOUTH  DAILY Patient taking differently: Take 10 mg by mouth at bedtime. 03/01/21   Glean SalvoSlack, Sarah J, NP  gabapentin (NEURONTIN) 300 MG capsule TAKE 1 CAPSULE BY MOUTH TWO TIMES DAILY 06/21/21   Glean SalvoSlack, Sarah J, NP  glipiZIDE (GLUCOTROL) 5 MG tablet TAKE 1 TABLET BY MOUTH EVERY DAY AT LUNCH 05/09/21   Arnette FeltsMoore, Janece, FNP  Glucagon (GVOKE HYPOPEN 2-PACK) 0.5 MG/0.1ML SOAJ Inject 0.1 mLs into the skin as needed (if blood sugar less than 70). Patient not taking: Reported on 07/14/2021 05/19/21   Arnette FeltsMoore, Janece, FNP  glucose blood test strip Use as instructed to check sugars qac and qhs 05/27/21   Ghumman, Ramandeep, NP  hydrOXYzine (VISTARIL) 100 MG capsule TAKE 1 CAPSULE BY MOUTH 3  TIMES DAILY AS NEEDED FOR  ITCHING Patient taking differently: Take 100 mg by mouth 3 (three) times daily. 03/01/21   Arnette FeltsMoore, Janece, FNP  insulin degludec (TRESIBA) 100 UNIT/ML FlexTouch Pen Inject 13 Units into the skin daily.    [provider]   insulin isophane & regular human (NOVOLIN 70/30 FLEXPEN) (70-30) 100 UNIT/ML KwikPen Inject 10 Units into the skin daily at 12 noon. Patient not taking: No sig reported 07/01/21   Arnette FeltsMoore, Janece, FNP  Insulin Pen Needle 32G X 4 MM MISC 1 each by Does not apply route 2 (two) times daily. Dx E11.9 05/27/21   Arnette FeltsMoore, Janece, FNP  Insulin Regular Human (NOVOLIN R FLEXPEN) 100 UNIT/ML SOPN  Inject according to sliding scale 3 times a day before meals. Dx: e11.65 inject 2 units if sugars are 150 -199, 4 units 200 - 249, 6 units 250 - 299, 8 units 300 - 349, 10 units if above 350 Patient not taking: No sig reported 07/03/21   Arnette Felts, FNP  metFORMIN (GLUCOPHAGE) 500 MG tablet TAKE 2 TABLETS (1,000 MG TOTAL) BY MOUTH 2 (TWO) TIMES DAILY WITH A MEAL. Patient taking differently: Take 1,000 mg by mouth 2 (two) times daily with a meal. 01/24/21   Arnette Felts, FNP  Olopatadine HCl 0.7 % SOLN Place 1 drop into both eyes daily.    [provider]  vitamin E 180 MG (400 UNITS) capsule Take 400 Units by mouth daily.     [provider]    Family History Family History  Problem Relation Age of Onset   Cancer Mother    Other Mother    COPD Father    Asthma Brother    Cancer Brother        colon   Heart attack Brother    Seizures Brother    Allergic rhinitis Neg Hx    Angioedema Neg Hx    Eczema Neg Hx    Immunodeficiency Neg Hx    Urticaria Neg Hx     Social History Social History   Tobacco Use   Smoking status: Never   Smokeless tobacco: Never   Tobacco comments:    Never Used Tobacco  Vaping Use   Vaping Use: Never used  Substance Use Topics   Alcohol use: Not Currently    Alcohol/week: 0.0 standard drinks    Comment: Occasional   Drug use: Not Currently    Types: Cocaine     Allergies   Ibuprofen, Lemon flavor, Amoxicillin, and Tylenol [acetaminophen]   Review of Systems Review of Systems  Musculoskeletal:  Positive for arthralgias and joint swelling.  All  other systems reviewed and are negative.   Physical Exam Triage Vital Signs ED Triage Vitals  Enc Vitals Group     BP 07/17/21 1730 95/67     Pulse Rate 07/17/21 1730 89     Resp 07/17/21 1730 16     Temp 07/17/21 1730 98.4 F (36.9 C)     Temp Source 07/17/21 1730 Oral     SpO2 07/17/21 1730 96 %     Weight --      Height --      Head Circumference --      Peak Flow --      Pain Score 07/17/21 1728 7     Pain Loc --      Pain Edu? --      Excl. in GC? --    No data found.  Updated Vital Signs BP 95/67 (BP Location: Left Arm)   Pulse 89   Temp 98.4 F (36.9 C) (Oral)   Resp 16   LMP 07/29/2012 (Approximate)   SpO2 96%   Visual Acuity Right Eye Distance:   Left Eye Distance:   Bilateral Distance:    Right Eye Near:   Left Eye Near:    Bilateral Near:     Physical Exam Vitals and nursing note reviewed.  Constitutional:      General: She is not in acute distress.    Appearance: Normal appearance. She is not ill-appearing, toxic-appearing or diaphoretic.  HENT:     Head: Normocephalic and atraumatic.  Eyes:     Conjunctiva/sclera: Conjunctivae normal.  Cardiovascular:  Rate and Rhythm: Normal rate.     Pulses: Normal pulses.  Pulmonary:     Effort: Pulmonary effort is normal.  Abdominal:     General: Abdomen is flat.  Musculoskeletal:     Right wrist: Swelling and tenderness present. No deformity, bony tenderness, snuff box tenderness or crepitus. Decreased range of motion (due to pain).     Left wrist: Normal.     Cervical back: Normal range of motion.  Skin:    General: Skin is warm and dry.  Neurological:     General: No focal deficit present.     Mental Status: She is alert and oriented to person, place, and time.  Psychiatric:        Mood and Affect: Mood normal.     UC Treatments / Results  Labs (all labs ordered are listed, but only abnormal results are displayed) Labs Reviewed - No data to display  EKG   Radiology No results  found.  Procedures Procedures (including critical care time)  Medications Ordered in UC Medications - No data to display  Initial Impression / Assessment and Plan / UC Course  I have reviewed the triage vital signs and the nursing notes.  Pertinent labs & imaging results that were available during my care of the patient were reviewed by me and considered in my medical decision making (see chart for details).    Assessment negative for red flags or concerns.  Likely right wrist sprain.  Continue conservative management with rest, ice, compression, and elevation.  Recommend following up with orthopedics if symptoms do not improve in the next few weeks.  Patient reports that she has been seen by Dr. Ophelia Charter previously so encouraged patient to follow up with him if symptoms do not improve.   Final Clinical Impressions(s) / UC Diagnoses   Final diagnoses:  Sprain of right wrist, initial encounter     Discharge Instructions      Continue taking care of your wrist.   Rest as much as possible Ice for 10-15 minutes every 4-6 hours as needed for pain and swelling Compression- use an ace bandage or splint for comfort Elevate above your hip/heart when sitting and laying down  Follow up with Dr. Ophelia Charter if symptoms do not improve.       ED Prescriptions   None    PDMP not reviewed this encounter.   Ivette Loyal, NP 07/17/21 [phone]

## 2021-07-20 ENCOUNTER — Telehealth: Payer: Self-pay | Admitting: Nurse Practitioner

## 2021-07-20 ENCOUNTER — Ambulatory Visit: Payer: Medicare Other

## 2021-07-20 DIAGNOSIS — F32A Depression, unspecified: Secondary | ICD-10-CM

## 2021-07-20 DIAGNOSIS — G2582 Stiff-man syndrome: Secondary | ICD-10-CM

## 2021-07-20 DIAGNOSIS — E119 Type 2 diabetes mellitus without complications: Secondary | ICD-10-CM

## 2021-07-20 NOTE — Telephone Encounter (Signed)
   Alley Neils DOB: 08-Apr-1966 MRN: [id number]   RIDER WAIVER AND RELEASE OF LIABILITY  For purposes of improving physical access to our facilities, [facility] is pleased to partner with third parties to provide [place] patients or other authorized individuals the option of convenient, on-demand ground transportation services (the [employer]") through use of the technology service that enables users to request on-demand ground transportation from independent third-party providers.  By opting to use and accept these [employer], I, the undersigned, hereby agree on behalf of myself, and on behalf of any minor child using the Science writer for whom I am the parent or legal guardian, as follows:  Science writer provided to me are provided by independent third-party transportation providers who are not [employer] or employees and who are unaffiliated with [employer]. [place] is neither a transportation carrier nor a common or public carrier. [facility] has no control over the quality or safety of the transportation that occurs as a result of the [employer]. [facility] cannot guarantee that any third-party transportation provider will complete any arranged transportation service. [facility] makes no representation, warranty, or guarantee regarding the reliability, timeliness, quality, safety, suitability, or availability of any of the Transport Services or that they will be error free. I fully understand that traveling by vehicle involves risks and dangers of serious bodily injury, including permanent disability, paralysis, and death. I agree, on behalf of myself and on behalf of any minor child using the Transport Services for whom I am the parent or legal guardian, that the entire risk arising out of my use of the [employer] remains solely with me, to the maximum extent permitted under applicable law. The [employer] are provided "as  is" and "as available." [facility] disclaims all representations and warranties, express, implied or statutory, not expressly set out in these terms, including the implied warranties of merchantability and fitness for a particular purpose. I hereby waive and release [facility], its agents, employees, officers, directors, representatives, insurers, attorneys, assigns, successors, subsidiaries, and affiliates from any and all past, present, or future claims, demands, liabilities, actions, causes of action, or suits of any kind directly or indirectly arising from acceptance and use of the [employer]. I further waive and release [facility] and its affiliates from all present and future liability and responsibility for any injury or death to persons or damages to property caused by or related to the use of the [employer]. I have read this Waiver and Release of Liability, and I understand the terms used in it and their legal significance. This Waiver is freely and voluntarily given with the understanding that my right (as well as the right of any minor child for whom I am the parent or legal guardian using the [employer]) to legal recourse against  in connection with the [employer] is knowingly surrendered in return for use of these services.   I attest that I read the consent document to [name], gave Ms. Niswander the opportunity to ask questions and answered the questions asked (if any). I affirm that [name] then provided consent for she's participation in this program.     Evette Doffing

## 2021-07-20 NOTE — Patient Instructions (Signed)
Goals Addressed      Transportation Assistance needed       Timeframe:  Short-Term Goal Priority:  High Start Date: 07/13/21                            Expected End Date:  10/13/21   Next Scheduled follow up:  07/27/21         Self Care Activities:  Self administers medications as prescribed Attends all scheduled provider appointments Calls pharmacy for medication refills Calls provider office for new concerns or questions Patient Goals: - work with Care guide for transportation assistance

## 2021-07-20 NOTE — Telephone Encounter (Signed)
   Telephone encounter was:  Successful.  07/20/2021 Name: Takeyah Wieman MRN: [id number] DOB: 1966/08/16  Jeaneen Cala is a 55 y.o. year old female who is a primary care patient of Arnette Felts, FNP . The community resource team was consulted for assistance with Transportation Needs   Care guide performed the following interventions: Follow up call placed to the patient to discuss status of referral  Patient was set up with [facility]. I called and confirmed with her they called with the ride details.  Follow Up Plan:  No further follow up planned at this time. The patient has been provided with needed resources.  April Green Care Guide, Embedded Care Coordination [facility], Care Management Phone: [phone] Email: [email or web address]

## 2021-07-20 NOTE — Chronic Care Management (AMB) (Addendum)
Chronic Care Management    Social Work Note  07/20/2021 Name: Sherri [name] MRN: [id number] DOB: 1966/05/28  Sherri [name] is a 55 y.o. year old female who is a primary care patient of Arnette Felts, FNP. The CCM team was consulted to assist the patient with chronic disease management and/or care coordination needs related to: Transportation Needs .   Engaged with patient by telephone for follow up visit in response to provider referral for social work chronic care management and care coordination services.   Consent to Services:  The patient was given information about Chronic Care Management services, agreed to services, and gave verbal consent prior to initiation of services.  Please see initial visit note for detailed documentation.   Patient agreed to services and consent obtained.   Assessment: Review of patient past medical history, allergies, medications, and health status, including review of relevant consultants reports was performed today as part of a comprehensive evaluation and provision of chronic care management and care coordination services.     SDOH (Social Determinants of Health) assessments and interventions performed:    Advanced Directives Status: Not addressed in this encounter.  CCM Care Plan  Allergies  Allergen Reactions   Ibuprofen Other (See Comments)    Does not take due to hx of renal insufficiency "I have kidney disease"    Lemon Flavor Swelling    Severe Lip Swelling  FRUIT per pt.     Amoxicillin Diarrhea and Other (See Comments)   Tylenol [Acetaminophen] Hives    Cannot take large quantities    Outpatient Encounter Medications as of 07/20/2021  Medication Sig   albuterol (VENTOLIN HFA) 108 (90 Base) MCG/ACT inhaler TAKE 2 PUFFS BY MOUTH EVERY 6 HOURS AS NEEDED FOR WHEEZE OR SHORTNESS OF BREATH (Patient taking differently: Inhale 2 puffs into the lungs every 6 (six) hours as needed for wheezing or shortness of breath.)   Ascorbic Acid  (VITAMIN C) 1000 MG tablet Take 1,000 mg by mouth daily.   baclofen (LIORESAL) 20 MG tablet Take 1 tablet 3 times daily   Cholecalciferol (VITAMIN D3) 10 MCG (400 UNIT) tablet Take 400 Units by mouth daily.   Cranberry-Vitamin C-Vitamin E 4200-20-3 MG-MG-UNIT CAPS Take 1 tablet by mouth daily.   cyanocobalamin (,VITAMIN B-12,) 1000 MCG/ML injection Inject 1,000 mcg into the muscle every 3 (three) months.   dapagliflozin propanediol (FARXIGA) 5 MG TABS tablet Take 1 tablet (5 mg total) by mouth daily before breakfast.   desloratadine (CLARINEX) 5 MG tablet TAKE 1 TABLET BY MOUTH  DAILY   diazepam (VALIUM) 10 MG tablet TAKE 2 TABLETS BY MOUTH 3  TIMES DAILY as needed   DIAZEPAM, 20 MG DOSE, NA Place into the nose.   EPINEPHRINE 0.3 mg/0.3 mL IJ SOAJ injection INJECT INTRAMUSCULARLY 1  PEN AS NEEDED FOR ALLERGIC  RESPONSE AS DIRECTED BY MD. SEEK MEDICAL HELP AFTER  USE.   famotidine (PEPCID) 20 MG tablet TAKE 1 TABLET BY MOUTH TWICE A DAY   FLUoxetine (PROZAC) 10 MG capsule TAKE 1 CAPSULE BY MOUTH  DAILY (Patient taking differently: Take 10 mg by mouth at bedtime.)   gabapentin (NEURONTIN) 300 MG capsule TAKE 1 CAPSULE BY MOUTH TWO TIMES DAILY   glipiZIDE (GLUCOTROL) 5 MG tablet TAKE 1 TABLET BY MOUTH EVERY DAY AT LUNCH   Glucagon (GVOKE HYPOPEN 2-PACK) 0.5 MG/0.1ML SOAJ Inject 0.1 mLs into the skin as needed (if blood sugar less than 70). (Patient not taking: Reported on 07/14/2021)   glucose blood test strip Use as  instructed to check sugars qac and qhs   hydrOXYzine (VISTARIL) 100 MG capsule TAKE 1 CAPSULE BY MOUTH 3  TIMES DAILY AS NEEDED FOR  ITCHING (Patient taking differently: Take 100 mg by mouth 3 (three) times daily.)   insulin degludec (TRESIBA) 100 UNIT/ML FlexTouch Pen Inject 13 Units into the skin daily.   insulin isophane & regular human (NOVOLIN 70/30 FLEXPEN) (70-30) 100 UNIT/ML KwikPen Inject 10 Units into the skin daily at 12 noon. (Patient not taking: No sig reported)   Insulin Pen  Needle 32G X 4 MM MISC 1 each by Does not apply route 2 (two) times daily. Dx E11.9   Insulin Regular Human (NOVOLIN R FLEXPEN) 100 UNIT/ML SOPN Inject according to sliding scale 3 times a day before meals. Dx: e11.65 inject 2 units if sugars are 150 -199, 4 units 200 - 249, 6 units 250 - 299, 8 units 300 - 349, 10 units if above 350 (Patient not taking: No sig reported)   metFORMIN (GLUCOPHAGE) 500 MG tablet TAKE 2 TABLETS (1,000 MG TOTAL) BY MOUTH 2 (TWO) TIMES DAILY WITH A MEAL. (Patient taking differently: Take 1,000 mg by mouth 2 (two) times daily with a meal.)   Olopatadine HCl 0.7 % SOLN Place 1 drop into both eyes daily.   vitamin E 180 MG (400 UNITS) capsule Take 400 Units by mouth daily.    No facility-administered encounter medications on file as of 07/20/2021.    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   Hiatal hernia 03/21/2013   Anemia, iron deficiency 01/29/2013   Spasm of muscle 09/02/2012   Anemia 08/28/2012   Mild intermittent asthma 08/27/2012   Chronic back pain    Pernicious anemia 10/30/2011   DEGENERATIVE DISC DISEASE 08/18/2010   Asthma with bronchitis 07/28/2010   Recurrent urticaria 07/28/2010    Conditions to be addressed/monitored: DMII, Depression, and Stiff Person Syndrome ; Transportation  Care Plan : Social Work [facility] Plan of Care  Updates made by [name] since 07/20/2021 12:00 AM     Problem: Barriers to Treatment      Goal:  Transportation Arranged for Upcoming Appointment   Start Date: 07/20/2021  Expected End Date: 08/19/2021  Priority: High  Note:   Current Barriers:  Chronic disease management support and education needs related to DM, Depression, and Stiff Person Syndrome   Transportation  Social Worker Clinical Goal(s):  patient will work with SW to identify and address any acute and/or chronic care coordination needs related to the self health management of DM, Depression, and Stiff Person Syndrome    SW Interventions:  Inter-disciplinary care team collaboration (see longitudinal plan of care) Collaboration with Arnette Felts, FNP regarding development and update of comprehensive plan of care as evidenced by provider attestation and co-signature Voice message received from the patient requesting follow up on transportation need for 7.28 appointment Successful outbound call placed to the patient who has spoken with a community resource care guide in the last  week to review transportation needs Patient reports she has an appointment tomorrow at the [facility] and does not have transportation arranged Advised the patient SW would request transportation via [facility] e-mail with transportation request sent to the transportation department - SW requested the transportation department contact the patient regarding pick up time Collaboration with Dannielle Burn who has contacted the patient and confirmed a pick up time of 1:30 pm  Patient Goals/Self-Care Activities patient will:  -Attend 7.28 appointment  -  work with SW to identify long-term transportation resources  Follow Up Plan:  SW will follow up with the patient over the next week       Follow Up Plan: SW will follow up with patient by phone over the next 10 days      [name], BSW, CDP Social Worker, Certified Dementia Practitioner [facility] Care Management [phone]

## 2021-07-20 NOTE — Patient Instructions (Signed)
Social Worker Visit Information  Goals we discussed today:   Goals Addressed             This Visit's Progress    Transportation Arranged for Upcomming Appointment       Timeframe:  Short-Term Goal Priority:  High Start Date: 7.27.22                            Expected End Date: 8.26.22                   Patient Goals/Self-Care Activities patient will:  -Attend 7.28 appointment  - work with SW to identify long-term transportation resources         Follow Up Plan: SW will follow up with patient by phone over the next 10 days   [name], Kenard Gower, CDP Social Worker, Certified Dementia Practitioner [facility] Care Management [phone]
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
## 2021-07-21 ENCOUNTER — Inpatient Hospital Stay: Payer: Medicare Other | Admitting: Family

## 2021-07-21 ENCOUNTER — Inpatient Hospital Stay: Payer: Medicare Other

## 2021-07-24 DIAGNOSIS — R404 Transient alteration of awareness: Secondary | ICD-10-CM | POA: Diagnosis not present

## 2021-07-24 DIAGNOSIS — R739 Hyperglycemia, unspecified: Secondary | ICD-10-CM | POA: Diagnosis not present

## 2021-07-24 DIAGNOSIS — R4182 Altered mental status, unspecified: Secondary | ICD-10-CM | POA: Diagnosis not present

## 2021-07-24 DIAGNOSIS — R0681 Apnea, not elsewhere classified: Secondary | ICD-10-CM | POA: Diagnosis not present

## 2021-07-24 DIAGNOSIS — R410 Disorientation, unspecified: Secondary | ICD-10-CM | POA: Diagnosis not present

## 2021-07-25 ENCOUNTER — Telehealth: Payer: Self-pay

## 2021-07-25 DIAGNOSIS — S299XXA Unspecified injury of thorax, initial encounter: Secondary | ICD-10-CM | POA: Diagnosis not present

## 2021-07-25 DIAGNOSIS — S3991XA Unspecified injury of abdomen, initial encounter: Secondary | ICD-10-CM | POA: Diagnosis not present

## 2021-07-25 DIAGNOSIS — Z041 Encounter for examination and observation following transport accident: Secondary | ICD-10-CM | POA: Diagnosis not present

## 2021-07-25 DIAGNOSIS — M4185 Other forms of scoliosis, thoracolumbar region: Secondary | ICD-10-CM | POA: Diagnosis not present

## 2021-07-25 DIAGNOSIS — R4182 Altered mental status, unspecified: Secondary | ICD-10-CM | POA: Diagnosis not present

## 2021-07-25 NOTE — Telephone Encounter (Signed)
Transition Care Management Follow-up Telephone Call Date of discharge and from where:  07/24/2021 [facility]  How have you been since you were released from the hospital? Pt states she is still in pain, hurting.  Any questions or concerns? No   Items Reviewed: Did the pt receive and understand the discharge instructions provided? Yes  Medications obtained and verified? Yes  Other? Yes  Any new allergies since your discharge? No  Dietary orders reviewed? Yes Do you have support at home? Yes   Home Care and Equipment/Supplies: Were home health services ordered? not applicable If so, what is the name of the agency? N/a   Has the agency set up a time to come to the patient's home? not applicable Were any new equipment or medical supplies ordered?  No What is the name of the medical supply agency? N/a  Were you able to get the supplies/equipment? not applicable Do you have any questions related to the use of the equipment or supplies? No  Functional Questionnaire: (I = Independent and D = Dependent) ADLs: I  Bathing/Dressing- I  Meal Prep- I  Eating- I  Maintaining continence- I  Transferring/Ambulation- I  Managing Meds- I  Follow up appointments reviewed:  PCP Hospital f/u appt confirmed? No  Scheduled to see N/A  on N/A  @ N/A . Specialist Hospital f/u appt confirmed? No  Scheduled to see N/A  on N/A  @ N/A. Are transportation arrangements needed? No  If their condition worsens, is the pt aware to call PCP or go to the Emergency Dept.? Yes Was the patient provided with contact information for the PCP's office or ED? Yes Was to pt encouraged to call back with questions or concerns? Yes  PT STATED SHE WILL CALL BACK TO MAKE APPT WHEN CONVENIENT. **

## 2021-07-26 ENCOUNTER — Ambulatory Visit: Payer: Medicare Other | Admitting: Orthopaedic Surgery

## 2021-07-27 ENCOUNTER — Ambulatory Visit (INDEPENDENT_AMBULATORY_CARE_PROVIDER_SITE_OTHER): Payer: Medicare Other

## 2021-07-27 DIAGNOSIS — E119 Type 2 diabetes mellitus without complications: Secondary | ICD-10-CM

## 2021-07-27 DIAGNOSIS — G2582 Stiff-man syndrome: Secondary | ICD-10-CM

## 2021-07-27 DIAGNOSIS — N179 Acute kidney failure, unspecified: Secondary | ICD-10-CM
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
## 2021-07-29 ENCOUNTER — Ambulatory Visit: Payer: Medicare Other

## 2021-07-29 DIAGNOSIS — E119 Type 2 diabetes mellitus without complications: Secondary | ICD-10-CM

## 2021-07-29 DIAGNOSIS — F32A Depression, unspecified: Secondary | ICD-10-CM

## 2021-07-29 DIAGNOSIS — G2582 Stiff-man syndrome: Secondary | ICD-10-CM

## 2021-07-29 NOTE — Patient Instructions (Signed)
Social Worker Visit Information  Goals we discussed today:   Goals Addressed             This Visit's Progress    COMPLETED: Home Safety Maintained       Timeframe:  Short-Term Goal Priority:  High Start Date:  6.8.22                           Expected End Date: 7.8.22                     Patient Goals/Self-Care Activities patient will:   - Obtain cashiers check to pay her August Rent -Contact SW as needed     Transportation Arranged for [employer]       Timeframe:  Short-Term Goal Priority:  High Start Date: 7.27.22                            Expected End Date: 8.26.22         Next scheduled outreach: 8.11.22            Patient Goals/Self-Care Activities patient will:  -Attend 8.9 appointment  - work with SW to identify long-term Community education officer Provided: No: Patient declined  Follow Up Plan: SW will follow up with patient by phone over the next week   [name], BSW, CDP Social Worker, Certified Dementia Practitioner [facility] Care Management [phone]
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
## 2021-08-02 ENCOUNTER — Ambulatory Visit (INDEPENDENT_AMBULATORY_CARE_PROVIDER_SITE_OTHER): Payer: Medicare Other

## 2021-08-02 ENCOUNTER — Ambulatory Visit: Payer: Self-pay

## 2021-08-02 ENCOUNTER — Other Ambulatory Visit: Payer: Self-pay

## 2021-08-02 ENCOUNTER — Ambulatory Visit (INDEPENDENT_AMBULATORY_CARE_PROVIDER_SITE_OTHER): Payer: Medicare Other | Admitting: Orthopaedic Surgery

## 2021-08-02 ENCOUNTER — Encounter: Payer: Self-pay | Admitting: Orthopaedic Surgery

## 2021-08-02 VITALS — BP 103/66 | HR 111 | Ht 63.0 in | Wt 92.0 lb

## 2021-08-02 DIAGNOSIS — M25532 Pain in left wrist: Secondary | ICD-10-CM | POA: Diagnosis not present

## 2021-08-02 DIAGNOSIS — M25531 Pain in right wrist: Secondary | ICD-10-CM

## 2021-08-02 DIAGNOSIS — M25562 Pain in left knee: Secondary | ICD-10-CM | POA: Diagnosis not present
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
## 2021-08-04 ENCOUNTER — Telehealth: Payer: Self-pay

## 2021-08-04 ENCOUNTER — Telehealth: Payer: Medicare Other

## 2021-08-04 NOTE — Telephone Encounter (Signed)
  Care Management   Follow Up Note   08/04/2021 Name: Sherri [name] MRN: [id number] DOB: 1966/01/10   Referred by: Arnette Felts, FNP Reason for referral : Chronic Care Management   An unsuccessful telephone outreach was attempted today. The patient was referred to the case management team for assistance with care management and care coordination. SW left a HIPAA compliant voice message requesting a return call.  Follow Up Plan: The care management team will reach out to the patient again over the next 21 days.   [name], BSW, CDP Social Worker, Certified Dementia Practitioner [facility] Care Management [phone]

## 2021-08-05 ENCOUNTER — Ambulatory Visit: Payer: Medicare Other

## 2021-08-05 DIAGNOSIS — F32A Depression, unspecified: Secondary | ICD-10-CM

## 2021-08-05 DIAGNOSIS — E119 Type 2 diabetes mellitus without complications: Secondary | ICD-10-CM

## 2021-08-05 DIAGNOSIS — G2582 Stiff-man syndrome: Secondary | ICD-10-CM
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
## 2021-08-08 ENCOUNTER — Ambulatory Visit (INDEPENDENT_AMBULATORY_CARE_PROVIDER_SITE_OTHER): Payer: Medicare Other | Admitting: Internal Medicine

## 2021-08-08 ENCOUNTER — Other Ambulatory Visit: Payer: Self-pay

## 2021-08-08 VITALS — BP 102/60 | HR 94 | Ht 63.0 in | Wt 94.2 lb

## 2021-08-08 DIAGNOSIS — Z794 Long term (current) use of insulin: Secondary | ICD-10-CM | POA: Diagnosis not present

## 2021-08-08 DIAGNOSIS — E063 Autoimmune thyroiditis: Secondary | ICD-10-CM

## 2021-08-08 DIAGNOSIS — N1831 Chronic kidney disease, stage 3a: Secondary | ICD-10-CM

## 2021-08-08 DIAGNOSIS — E119 Type 2 diabetes mellitus without complications: Secondary | ICD-10-CM | POA: Diagnosis not present

## 2021-08-08 DIAGNOSIS — E1122 Type 2 diabetes mellitus with diabetic chronic kidney disease: Secondary | ICD-10-CM | POA: Diagnosis not present

## 2021-08-08 DIAGNOSIS — E1165 Type 2 diabetes mellitus with hyperglycemia: Secondary | ICD-10-CM

## 2021-08-08 DIAGNOSIS — E1142 Type 2 diabetes mellitus with diabetic polyneuropathy: Secondary | ICD-10-CM

## 2021-08-08 LAB — POCT GLYCOSYLATED HEMOGLOBIN (HGB A1C): Hemoglobin A1C: 12 % — AB (ref 4.0–5.6)

## 2021-08-08 LAB — BASIC METABOLIC PANEL
BUN: 22 mg/dL (ref 6–23)
CO2: 25 mEq/L (ref 19–32)
Calcium: 10.3 mg/dL (ref 8.4–10.5)
Chloride: 97 mEq/L (ref 96–112)
Creatinine, Ser: 1.11 mg/dL (ref 0.40–1.20)
GFR: 55.4 mL/min — ABNORMAL LOW (ref >60.00)
Glucose, Bld: 239 mg/dL — ABNORMAL HIGH (ref 70–99)
Potassium: 4.5 mEq/L (ref 3.5–5.1)
Sodium: 135 mEq/L (ref 135–145)

## 2021-08-08 LAB — TSH: TSH: 2.28 u[IU]/mL (ref 0.35–5.50)

## 2021-08-08 LAB — T4, FREE: Free T4: 0.94 ng/dL (ref 0.60–1.60)

## 2021-08-08 MED ORDER — DEXCOM G6 SENSOR MISC: 1.0000 | 3 refills | Status: DC

## 2021-08-08 MED ORDER — DEXCOM G6 TRANSMITTER MISC: 1.0000 | 3 refills | Status: DC

## 2021-08-08 MED ORDER — LYUMJEV KWIKPEN 100 UNIT/ML ~~LOC~~ SOPN: PEN_INJECTOR | SUBCUTANEOUS | 0 refills | Status: DC

## 2021-08-08 NOTE — Progress Notes (Signed)
Name: Meghen Akopyan  MRN/ DOB: [id number], 28-Feb-1966   Age/ Sex: 55 y.o., female    PCP: Arnette Felts, FNP   Reason for Endocrinology Evaluation: Type 2 Diabetes Mellitus     Date of Initial Endocrinology Visit: 08/08/2021     PATIENT IDENTIFIER: Sherri [name] is a 55 y.o. female with a past medical history of T2DM, CKD, hx of coccaine abuse, depression, DVT . The patient presented for initial endocrinology clinic visit on 08/08/2021 for consultative assistance with her diabetes management.    HPI: Ms. Cervantes was    Diagnosed with DM in 2021 Prior Medications tried/Intolerance: She was started on oral glycemic agents but insulin was added due to persistent hyperglycemia  Currently checking blood sugars 2 x / day Hypoglycemia episodes : yes               Symptoms: feels drunk        Frequency: 2-3/ week Hemoglobin A1c has ranged from 6.4% in 2021, peaking at 13.0% in 2022. Patient required assistance for hypoglycemia:  Patient has required hospitalization within the last 1 year from hyper or hypoglycemia: 10/2020 for hyperglycemia.     In terms of diet, the patient eats 6 meals a day , avoids sugar sweetened beverages    Was diagnosed with stiff person syndrome in 2021, follows with Dr. Anne Hahn She was also found to have en elevated Anti-TPO Ab's at 37 IU/Ml ( reference 0-34)     HOME DIABETES REGIMEN: Farxiga 10 mg daily  Glipizide 5 mg daily with lunch  Tresiba 13 -15 units daily  Regular insulin - not taking  Metformin 500 mg 2 tabs BID      Statin: no ACE-I/ARB: no    METER DOWNLOAD SUMMARY: Date range evaluated: 8/1-8/15/2022  Average Number Tests/Day = 0.5 Overall Mean FS Glucose = 293 Standard Deviation = 137  BG Ranges: Low = 198 High = 559   Hypoglycemic Events/30 Days: BG < 50 = 0 Episodes of symptomatic severe hypoglycemia = 0   DIABETIC COMPLICATIONS: Microvascular complications:  CKD III, neuropathy Denies: retinopathy Last eye  exam: Completed > 2 yrs ago   Macrovascular complications:   Denies: CAD, PVD, CVA   PAST HISTORY: Past Medical History:  Past Medical History:  Diagnosis Date   Angioedema    Anxiety    Arthritis    Asthma    Benzodiazepine withdrawal (HCC) 08/30/2012   CAP (community acquired pneumonia) 08/29/2012   Chronic back pain    Chronic kidney disease    Closed nondisplaced fracture of proximal phalanx of right little finger 10/18/2018   Cocaine abuse (HCC)    Depression    Diarrhea    DVT (deep venous thrombosis) (HCC)    Environmental allergies    Fall    GERD (gastroesophageal reflux disease)    Heart murmur    has been told once that she has a heart murmur, but has never had any problems   Hiatal hernia 03/21/2013   Lumbar herniated disc    Peripheral vascular disease (HCC)    Pernicious anemia 10/30/2011   Postconcussion syndrome 12/30/2014   Rhabdomyolysis 08/27/2012   Seasonal allergies    Stiff person syndrome    Urticaria    Past Surgical History:  Past Surgical History:  Procedure Laterality Date   BUNIONECTOMY Left    colonoscopy  07/09/15   inguinal hernia 1983  1983   OPEN REDUCTION INTERNAL FIXATION (ORIF) DISTAL RADIAL FRACTURE Right 08/16/2016   Procedure: OPEN REDUCTION INTERNAL  FIXATION (ORIF) DISTAL RADIAL FRACTURE;  Surgeon: Eldred Manges, MD;  Location: [facility] OR;  Service: Orthopedics;  Laterality: Right;    Social History:  reports that she has never smoked. She has never used smokeless tobacco. She reports that she does not currently use alcohol. She reports that she does not currently use drugs after having used the following drugs: Cocaine. Family History:  Family History  Problem Relation Age of Onset   Cancer Mother    Other Mother    COPD Father    Asthma Brother    Cancer Brother        colon   Heart attack Brother    Seizures Brother    Allergic rhinitis Neg Hx    Angioedema Neg Hx    Eczema Neg Hx    Immunodeficiency Neg Hx    Urticaria Neg Hx       HOME MEDICATIONS: Allergies as of 08/08/2021       Reactions   Ibuprofen Other (See Comments)   Does not take due to hx of renal insufficiency "I have kidney disease"    Lemon Flavor Swelling   Severe Lip Swelling  FRUIT per pt.     Amoxicillin Diarrhea, Other (See Comments)   Tylenol [acetaminophen] Hives   Cannot take large quantities        Medication List        Accurate as of August 08, 2021 10:32 AM. If you have any questions, ask your nurse or doctor.          STOP taking these medications    glipiZIDE 5 MG tablet Commonly known as: GLUCOTROL Stopped by: Scarlette Shorts, MD   metFORMIN 500 MG tablet Commonly known as: GLUCOPHAGE Stopped by: Scarlette Shorts, MD   NovoLIN R FlexPen ReliOn 100 UNIT/ML KwikPen Generic drug: Insulin Regular Human Stopped by: Scarlette Shorts, MD       TAKE these medications    albuterol 108 (90 Base) MCG/ACT inhaler Commonly known as: VENTOLIN HFA TAKE 2 PUFFS BY MOUTH EVERY 6 HOURS AS NEEDED FOR WHEEZE OR SHORTNESS OF BREATH What changed: See the new instructions.   baclofen 20 MG tablet Commonly known as: LIORESAL Take 1 tablet 3 times daily   Cranberry-Vitamin C-Vitamin E 4200-20-3 MG-MG-UNIT Caps Take 1 tablet by mouth daily.   cyanocobalamin 1000 MCG/ML injection Commonly known as: (VITAMIN B-12) Inject 1,000 mcg into the muscle every 3 (three) months.   dapagliflozin propanediol 5 MG Tabs tablet Commonly known as: Farxiga Take 1 tablet (5 mg total) by mouth daily before breakfast.   desloratadine 5 MG tablet Commonly known as: CLARINEX TAKE 1 TABLET BY MOUTH  DAILY   Dexcom G6 Sensor Misc 1 Device by Does not apply route as directed. Started by: Scarlette Shorts, MD   Dexcom G6 Transmitter Misc 1 Device by Does not apply route as directed. Started by: Scarlette Shorts, MD   DIAZEPAM (20 MG DOSE) NA Place into the nose.   diazepam 10 MG tablet Commonly known as:  VALIUM TAKE 2 TABLETS BY MOUTH 3  TIMES DAILY as needed   EPINEPHrine 0.3 mg/0.3 mL Soaj injection Commonly known as: EPI-PEN INJECT INTRAMUSCULARLY 1  PEN AS NEEDED FOR ALLERGIC  RESPONSE AS DIRECTED BY MD. SEEK MEDICAL HELP AFTER  USE.   famotidine 20 MG tablet Commonly known as: PEPCID TAKE 1 TABLET BY MOUTH TWICE A DAY   FLUoxetine 10 MG capsule Commonly known as: PROZAC TAKE 1 CAPSULE BY MOUTH  DAILY What changed: when to take this   gabapentin 300 MG capsule Commonly known as: NEURONTIN TAKE 1 CAPSULE BY MOUTH TWO TIMES DAILY   glucose blood test strip Use as instructed to check sugars qac and qhs   Gvoke HypoPen 2-Pack 0.5 MG/0.1ML Soaj Generic drug: Glucagon Inject 0.1 mLs into the skin as needed (if blood sugar less than 70).   hydrOXYzine 100 MG capsule Commonly known as: VISTARIL TAKE 1 CAPSULE BY MOUTH 3  TIMES DAILY AS NEEDED FOR  ITCHING What changed:  how much to take how to take this when to take this additional instructions   insulin degludec 100 UNIT/ML FlexTouch Pen Commonly known as: TRESIBA Inject 13 Units into the skin daily.   Insulin Pen Needle 32G X 4 MM Misc 1 each by Does not apply route 2 (two) times daily. Dx E11.9   NovoLIN 70/30 Kwikpen (70-30) 100 UNIT/ML KwikPen Generic drug: insulin isophane & regular human KwikPen Inject 10 Units into the skin daily at 12 noon.   Olopatadine HCl 0.7 % Soln Place 1 drop into both eyes daily.   vitamin C 1000 MG tablet Take 1,000 mg by mouth daily.   Vitamin D3 10 MCG (400 UNIT) tablet Take 400 Units by mouth daily.   vitamin E 180 MG (400 UNITS) capsule Take 400 Units by mouth daily.         ALLERGIES: Allergies  Allergen Reactions   Ibuprofen Other (See Comments)    Does not take due to hx of renal insufficiency "I have kidney disease"    Lemon Flavor Swelling    Severe Lip Swelling  FRUIT per pt.     Amoxicillin Diarrhea and Other (See Comments)   Tylenol [Acetaminophen]  Hives    Cannot take large quantities     REVIEW OF SYSTEMS: A comprehensive ROS was conducted with the patient and is negative except as per HPI    OBJECTIVE:   VITAL SIGNS: BP 102/60   Pulse 94   Ht 5\' 3"  (1.6 m)   Wt 94 lb 3.2 oz (42.7 kg)   LMP 07/29/2012 (Approximate)   SpO2 99%   BMI 16.69 kg/m    PHYSICAL EXAM:  General: Pt appears well and is in NAD  Neck: General: Supple without adenopathy or carotid bruits. Thyroid: Thyroid size normal.  No goiter or nodules appreciated.  Lungs: Clear with good BS bilat with no rales, rhonchi, or wheezes  Heart: RRR with normal S1 and S2 and no gallops; no murmurs; no rub  Abdomen: Normoactive bowel sounds, soft, nontender, without masses or organomegaly palpable  Extremities:  Lower extremities - No pretibial edema. No lesions.  Neuro: MS is good with appropriate affect, pt is alert and Ox3    DATA REVIEWED:  Lab Results  Component Value Date   HGBA1C 12.0 (A) 08/08/2021   HGBA1C 13.0 (H) 05/06/2021   HGBA1C 12.3 (H) 01/25/2021   Lab Results  Component Value Date   MICROALBUR 30mg  03/22/2021   LDLCALC 83 06/03/2020   CREATININE 1.75 (H) 05/06/2021   Lab Results  Component Value Date   MICRALBCREAT 30-300mg /g 03/22/2021    Lab Results  Component Value Date   CHOL 172 06/03/2020   HDL 75 06/03/2020   LDLCALC 83 06/03/2020   TRIG 76 06/03/2020   CHOLHDL 2.3 06/03/2020        ASSESSMENT / PLAN / RECOMMENDATIONS:   1) Type 2 Diabetes Mellitus, Poorly controlled, With CKD III and neuropathic  complications - Most  recent A1c of 12.0 %. Goal A1c < 7.0 %.    - I have discussed with the patient the pathophysiology of diabetes. We went over the natural progression of the disease. I explained the complications associated with diabetes including retinopathy, nephropathy, neuropathy as well as increased risk of cardiovascular disease. We went over the benefit seen with glycemic control.   - Dexcom prescribed  - I am  going to check her for Type 1 antibodies (GAD-65 and Islet cell Ab's ) especially with this a diagnosis of stiff person syndrome , I have high suspicion for Type 1 diabetes  - I have encouraged her to consider prandial insulin which she has agreed too    MEDICATIONS:  - STOP Metformin - STOP Glipizide  - Continue Farxiga 10 mg daily  - Tresiba 12 units daily  - Humalog 3 units with each meal   EDUCATION / INSTRUCTIONS: BG monitoring instructions: Patient is instructed to check her blood sugars 3 times a day, before meals. Call [facility] if: BG persistently < 70  I reviewed the Rule of 15 for the treatment of hypoglycemia in detail with the patient. Literature supplied.   2) Diabetic complications:  Eye: Does not have known diabetic retinopathy.  Neuro/ Feet: Does  have known diabetic peripheral neuropathy. Renal: Patient does  have known baseline CKD. She is not on an ACEI/ARB at present.   3) Hashimoto's Disease:   - TFT's are normal.    F/U in 3 months      Signed electronically by: Lyndle HerrlichAbby Jaralla Altamese Deguire, MD  [facility]  [facility] [address] [place], [place] [address] Phone: [phone] FAX: [phone]   CC: Arnette FeltsMoore, Janece, FNP [address] [place] [address] Phone: [phone]  Fax: [phone]    Return to Endocrinology clinic as below: Future Appointments  Date Time Provider Department Center  08/09/2021  1:30 PM [facility] LAB [facility] None  08/09/2021  1:45 PM [place], Brand MalesSarah M, NP [facility] None  08/10/2021 11:15 AM [facility] CASE MANAGER [facility] None  08/11/2021  2:00 PM [facility] PHARMACIST [facility] None  08/18/2021 11:30 AM [facility] CCM SOCIAL WORK [facility] None  01/03/2022  1:45 PM Glean SalvoSlack, Sarah J, NP [facility] None  08/17/2022  9:00 AM [facility] None  08/17/2022 10:00 AM Arnette FeltsMoore, Janece, FNP [facility] None

## 2021-08-08 NOTE — Patient Instructions (Addendum)
-   STOP Metformin - STOP Glipizide  - Continue Farxiga 10 mg daily  - Tresiba 12 units daily  - Humalog 3 units with each meal      HOW TO TREAT LOW BLOOD SUGARS (Blood sugar LESS THAN 70 MG/DL) Please follow the RULE OF 15 for the treatment of hypoglycemia treatment (when your (blood sugars are less than 70 mg/dL)   STEP 1: Take 15 grams of carbohydrates when your blood sugar is low, which includes:  3-4 GLUCOSE TABS  OR 3-4 OZ OF JUICE OR REGULAR SODA OR ONE TUBE OF GLUCOSE GEL    STEP 2: RECHECK blood sugar in 15 MINUTES STEP 3: If your blood sugar is still low at the 15 minute recheck --> then, go back to STEP 1 and treat AGAIN with another 15 grams of carbohydrates.

## 2021-08-09 ENCOUNTER — Inpatient Hospital Stay (HOSPITAL_BASED_OUTPATIENT_CLINIC_OR_DEPARTMENT_OTHER): Payer: Medicare Other | Admitting: Family

## 2021-08-09 ENCOUNTER — Inpatient Hospital Stay: Payer: Medicare Other | Attending: Hematology & Oncology

## 2021-08-09 ENCOUNTER — Inpatient Hospital Stay: Payer: Medicare Other

## 2021-08-09 ENCOUNTER — Encounter: Payer: Self-pay | Admitting: Family

## 2021-08-09 VITALS — BP 103/67 | HR 82 | Temp 98.0°F | Resp 17 | Wt 95.0 lb

## 2021-08-09 DIAGNOSIS — D508 Other iron deficiency anemias: Secondary | ICD-10-CM

## 2021-08-09 DIAGNOSIS — D510 Vitamin B12 deficiency anemia due to intrinsic factor deficiency: Secondary | ICD-10-CM | POA: Diagnosis not present

## 2021-08-09 DIAGNOSIS — D509 Iron deficiency anemia, unspecified: Secondary | ICD-10-CM | POA: Insufficient documentation

## 2021-08-09 DIAGNOSIS — E538 Deficiency of other specified B group vitamins: Secondary | ICD-10-CM | POA: Insufficient documentation

## 2021-08-09 DIAGNOSIS — E063 Autoimmune thyroiditis: Secondary | ICD-10-CM | POA: Insufficient documentation

## 2021-08-09 DIAGNOSIS — E1165 Type 2 diabetes mellitus with hyperglycemia: Secondary | ICD-10-CM | POA: Insufficient documentation

## 2021-08-09 DIAGNOSIS — Z794 Long term (current) use of insulin: Secondary | ICD-10-CM | POA: Insufficient documentation

## 2021-08-09 LAB — RETICULOCYTES
Immature Retic Fract: 11.3 % (ref 2.3–15.9)
RBC.: 4.03 MIL/uL (ref 3.87–5.11)
Retic Count, Absolute: 103.2 10*3/uL (ref 19.0–186.0)
Retic Ct Pct: 2.6 % (ref 0.4–3.1)

## 2021-08-09 LAB — CBC WITH DIFFERENTIAL (CANCER CENTER ONLY)
Abs Immature Granulocytes: 0.04 10*3/uL (ref 0.00–0.07)
Basophils Absolute: 0 10*3/uL (ref 0.0–0.1)
Basophils Relative: 0 %
Eosinophils Absolute: 0 10*3/uL (ref 0.0–0.5)
Eosinophils Relative: 0 %
HCT: 34.6 % — ABNORMAL LOW (ref 36.0–46.0)
Hemoglobin: 11.4 g/dL — ABNORMAL LOW (ref 12.0–15.0)
Immature Granulocytes: 1 %
Lymphocytes Relative: 25 %
Lymphs Abs: 1.4 10*3/uL (ref 0.7–4.0)
MCH: 28.3 pg (ref 26.0–34.0)
MCHC: 32.9 g/dL (ref 30.0–36.0)
MCV: 85.9 fL (ref 80.0–100.0)
Monocytes Absolute: 0.4 10*3/uL (ref 0.1–1.0)
Monocytes Relative: 7 %
Neutro Abs: 3.9 10*3/uL (ref 1.7–7.7)
Neutrophils Relative %: 67 %
Platelet Count: 297 10*3/uL (ref 150–400)
RBC: 4.03 MIL/uL (ref 3.87–5.11)
RDW: 12.4 % (ref 11.5–15.5)
WBC Count: 5.7 10*3/uL (ref 4.0–10.5)
nRBC: 0 % (ref 0.0–0.2)

## 2021-08-09 LAB — CMP (CANCER CENTER ONLY)
ALT: 33 U/L (ref 0–44)
AST: 15 U/L (ref 15–41)
Albumin: 4.1 g/dL (ref 3.5–5.0)
Alkaline Phosphatase: 92 U/L (ref 38–126)
Anion gap: 10 (ref 5–15)
BUN: 27 mg/dL — ABNORMAL HIGH (ref 6–20)
CO2: 28 mmol/L (ref 22–32)
Calcium: 9.9 mg/dL (ref 8.9–10.3)
Chloride: 99 mmol/L (ref 98–111)
Creatinine: 1.19 mg/dL — ABNORMAL HIGH (ref 0.44–1.00)
GFR, Estimated: 54 mL/min — ABNORMAL LOW (ref >60)
Glucose, Bld: 268 mg/dL — ABNORMAL HIGH (ref 70–99)
Potassium: 4.5 mmol/L (ref 3.5–5.1)
Sodium: 137 mmol/L (ref 135–145)
Total Bilirubin: 0.3 mg/dL (ref 0.3–1.2)
Total Protein: 6.4 g/dL — ABNORMAL LOW (ref 6.5–8.1)

## 2021-08-09 LAB — VITAMIN B12: Vitamin B-12: 224 pg/mL (ref 180–914)

## 2021-08-09 MED ORDER — CYANOCOBALAMIN 1000 MCG/ML IJ SOLN
1000.0000 ug | Freq: Once | INTRAMUSCULAR | Status: AC
  Administered 2021-08-09: 1000 ug via INTRAMUSCULAR
  Filled 2021-08-09: qty 1
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
## 2021-08-09 NOTE — Patient Instructions (Signed)
Cyanocobalamin, Vitamin B12 injection O que  este medicamento? A CIANOCOBALAMINA  uma forma sinttica de vitamina B12. A vitamina B12  essencial para o desenvolvimento de glbulos sanguneos, clulas nervosas e protenas saudveis pelo organismo. Tambm ajuda no metabolismo das gorduras e carboidratos. Este medicamento  usado para tratar pessoas que no conseguem absorver vitamina B12 suficiente. Este medicamento pode ser usado para outros propsitos; em caso de dvidas, pergunte ao seu profissional de sade ou farmacutico. NOMES DE MARCAS COMUNS: B-12 Compliance Kit, B-12 Injection Kit, Cyomin, LA-12, Nutri-Twelve, Physicians EZ Use B-12, Primabalt O que devo dizer a meu profissional de sade antes de tomar este medicamento? Precisam saber se voc tem algum dos seguintes problemas ou estados de sade: doenas renais sndrome ou doena de Leber anemia megaloblstica reao estranha ou alergia  cianocobalamina ou ao cobalto reao estranha ou alergia a outros medicamentos, alimentos, corantes ou conservantes est grvida ou tentando engravidar est amamentando Como devo usar este medicamento? Este medicamento  injetado por via intramuscular ou por injeo subcutnea profunda. Costuma ser administrado por um profissional de sade em consultrio ou Chief of Staff. Porm,  possvel que seu mdico lhe ensine como aplicar suas prprias injees. Siga todas as instrues. Fale com seu pediatra a respeito do uso deste medicamento em crianas. Pode ser preciso tomar alguns cuidados especiais. Superdosagem: Se achar que tomou uma superdosagem deste medicamento, entre em contato imediatamente com o [facility]toxica[facility] ou v a [employer]. OBSERVAO: Este medicamento  s para voc. No compartilhe este medicamento com outras pessoas. E se eu deixar de tomar uma dose? Se toma o medicamento em uma clnica ou no consultrio do seu mdico, ligue para Paramedic a Passenger transport manager. Se aplica as  injees por conta prpria e perdeu uma dose, tome-a assim que possvel. Se j estiver quase na hora da sua prxima dose, tome somente essa dose. No tome o remdio em dobro, nem tome uma dose adicional. O que pode interagir com este medicamento? colchicina consumo pesado de lcool Esta lista pode no descrever todas as interaes possveis. D ao seu profissional de sade uma lista de todos os medicamentos, ervas medicinais, remdios de venda livre, ou suplementos alimentares que voc [place]. Diga tambm se voc fuma, bebe, ou [place] drogas ilcitas. Alguns destes podem interagir com o seu medicamento. Ao que devo ficar atento quando estiver [employer] medicamento? Consulte seu mdico ou profissional de sade para acompanhamento regular Museum/gallery curator. Voc precisar fazer exames de sangue peridicos enquanto estiver [employer]. Voc pode precisar seguir uma dieta especial. Fale com seu mdico. Para conseguir o mximo benefcio deste medicamento, limite o seu consumo de lcool e evite fumar. Que efeitos colaterais posso sentir aps usar este medicamento? Efeitos colaterais que devem ser informados ao seu mdico ou profissional de sade o mais rpido possvel: reaes alrgicas, como erupo na pele, coceira, urticria, ou inchao do rosto, dos lbios ou da lngua pele azulada dor ou aperto no peito chiado no peito ou dificuldade para respirar tontura rea vermelha, inchada e dolorosa na perna Efeitos colaterais que normalmente no precisam de cuidados mdicos (avise ao seu mdico ou profissional de sade se persistirem ou forem incmodos): diarreia dor de cabea Esta lista pode no descrever todos os efeitos colaterais possveis. Para mais orientaes sobre efeitos colaterais, consulte o seu mdico. Voc pode relatar a ocorrncia de efeitos colaterais  FDA pelo telefone [phone]. Onde devo guardar meu medicamento? [facility] fora do [employer]. Conservar em [employer], entre 15 e 30 degreesC (  59 e 86 degreesF). Proteger Administrator, arts. Descartar qualquer medicamento no utilizado aps a data de validade impressa no rtulo ou embalagem. OBSERVAO: Este folheto  um resumo. Pode no cobrir todas as informaes possveis. Se tiver dvidas a respeito deste medicamento, fale com seu mdico, farmacutico ou profissional de sade.  2021 Elsevier/Gold Standard (2010-09-14 00:00:00)

## 2021-08-10 ENCOUNTER — Other Ambulatory Visit: Payer: Self-pay | Admitting: Nurse Practitioner

## 2021-08-10 ENCOUNTER — Telehealth: Payer: Self-pay | Admitting: *Deleted

## 2021-08-10 ENCOUNTER — Ambulatory Visit: Payer: Self-pay

## 2021-08-10 ENCOUNTER — Telehealth: Payer: Self-pay

## 2021-08-10 ENCOUNTER — Telehealth: Payer: Medicare Other

## 2021-08-10 DIAGNOSIS — K219 Gastro-esophageal reflux disease without esophagitis: Secondary | ICD-10-CM

## 2021-08-10 DIAGNOSIS — E063 Autoimmune thyroiditis: Secondary | ICD-10-CM

## 2021-08-10 DIAGNOSIS — E1165 Type 2 diabetes mellitus with hyperglycemia: Secondary | ICD-10-CM

## 2021-08-10 DIAGNOSIS — G2582 Stiff-man syndrome: Secondary | ICD-10-CM

## 2021-08-10 DIAGNOSIS — F32A Depression, unspecified: Secondary | ICD-10-CM

## 2021-08-10 DIAGNOSIS — J45909 Unspecified asthma, uncomplicated: Secondary | ICD-10-CM

## 2021-08-10 LAB — IRON AND TIBC
Iron: 87 ug/dL (ref 41–142)
Saturation Ratios: 33 % (ref 21–57)
TIBC: 265 ug/dL (ref 236–444)
UIBC: 178 ug/dL (ref 120–384)

## 2021-08-10 LAB — FERRITIN: Ferritin: 624 ng/mL — ABNORMAL HIGH (ref 11–307)

## 2021-08-10 NOTE — Telephone Encounter (Signed)
Per 08/09/21 los - called and gave upcoming appointments - confirmed -mailed calendar

## 2021-08-10 NOTE — Progress Notes (Signed)
   Patient aware of telephone appointment with Cherylin Mylar on 08-11-2021 at 12:30. Patient aware to have/bring all medications, supplements, blood pressure and/or blood sugar logs to visit.  Questions: Have you had any recent office visit or specialist visit outside of [facility] systems? Patient stated No  Are there any concerns you would like to discuss during your office visit? Patient stated patient assistance and speaking with a dietician  Are you having any problems obtaining your medications? (Whether it pharmacy issues or cost) Patient stated nothing besides patient assistance medications  If patient has any PAP medications ask if they are having any problems getting their PAP medication or refill? Yes  Care Gaps: Covid vaccine overdue Yearly Ophthalmology overdue Shingrix overdue MAMMOGRAM (Yearly)  Star Rating Drug: Farxiga 5 mg- Patient assistance  Any gaps in medications fill history? No  Huey Romans [facility] Clinical Pharmacist Assistant [phone]

## 2021-08-11 ENCOUNTER — Encounter: Payer: Self-pay | Admitting: Hematology & Oncology

## 2021-08-11 ENCOUNTER — Telehealth: Payer: Self-pay

## 2021-08-11 ENCOUNTER — Ambulatory Visit: Payer: Medicare Other

## 2021-08-11 DIAGNOSIS — J4520 Mild intermittent asthma, uncomplicated: Secondary | ICD-10-CM

## 2021-08-11 DIAGNOSIS — Z794 Long term (current) use of insulin: Secondary | ICD-10-CM

## 2021-08-11 DIAGNOSIS — E1165 Type 2 diabetes mellitus with hyperglycemia: Secondary | ICD-10-CM
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
## 2021-08-16 ENCOUNTER — Other Ambulatory Visit: Payer: Self-pay | Admitting: Internal Medicine

## 2021-08-17 NOTE — Patient Instructions (Addendum)
Visit Information It was great speaking with you today!  Please let me know if you have any questions about our visit.   Goals Addressed             This Visit's Progress    Manage My Medicine       Timeframe:  Long-Range Goal Priority:  High Start Date: 01/31/2021                        Expected End Date: 09/15/2021       Follow Up Date 09/15/2021   In Progress: - call for medicine refill 2 or 3 days before it runs out - call if I am sick and can't take my medicine - keep a list of all the medicines I take; vitamins and herbals too - use a pillbox to sort medicine - use an alarm clock or phone to remind me to take my medicine    Why is this important?   These steps will help you keep on track with your medicines.           Patient Care Plan: CCM Pharmacy Care Plan     Problem Identified: DM II, GERD   Priority: High     Long-Range Goal: Disease Management   Start Date: 07/05/2021  Recent Progress: On track  Priority: High  Note:   Current Barriers:  Unable to independently monitor therapeutic efficacy Unable to self administer medications as prescribed Does not adhere to prescribed medication regimen Does not maintain contact with provider office Does not contact provider office for questions/concerns  Pharmacist Clinical Goal(s):  Patient will verbalize ability to afford treatment regimen achieve adherence to monitoring guidelines and medication adherence to achieve therapeutic efficacy contact provider office for questions/concerns as evidenced notation of same in electronic health record through collaboration with PharmD and provider.   Interventions: 1:1 collaboration with Arnette Felts, FNP regarding development and update of comprehensive plan of care as evidenced by provider attestation and co-signature Inter-disciplinary care team collaboration (see longitudinal plan of care) Comprehensive medication review performed; medication list updated in  electronic medical record  Asthma (Goal: control symptoms and prevent exacerbations) -Controlled -Current treatment  Albuterol (Ventolin HFA) 108 (90 Base) MCG/ACT - take 2 puffs by mouth every 6 hours as needed for wheeze or shortness of breath -Exacerbations requiring treatment in last 6 months: None -Patient does not currently have a maintenance inhaler  -Frequency of rescue inhaler use: as needed patient reports that she has not needed it -Counseled on When to use rescue inhaler -Recommended to continue current medication  Diabetes (A1c goal <7%) -Uncontrolled -Current medications: Tresiba - inject 13 units daily Farxiga 5 mg tablet once per day  Lyumjev- injecting 3 units three times per day with meals  GVOKE Hypopen  -Medications previously tried: Glipizide, Metformin   -Current home glucose readings - she took her BS on Tuesday, she knows to check it three times per day but she is only checking it once or twice per week.  fasting glucose: 233  -Denies hypoglycemic/hyperglycemic symptoms -Current meal patterns:  breakfast: eating oatmeal, or an egg for breakfast, no more than 45 grams of carbohydrates    lunch: sandwich   dinner: baked chicken or pasta with spinach and other vegetables  snacks: sugar free jello drinks: drinking cranberry juice, with water, she is trying to increase her water, coke zero -Current exercise: she is walking frequently because she does not have a car  -Educated on  Benefits of routine self-monitoring of blood sugar; -Counseled to check feet daily and get yearly eye exams -Recommended to continue current medication   GERD (Goal: Reduce signs and symptoms of acid reflux) -Controlled -Current treatment  Famotidine 20 mg tablet twice per day -Recommended to continue current medication  Patient Goals/Self-Care Activities Patient will:  - take medications as prescribed  Follow Up Plan: The patient has been provided with contact information for  the care management team and has been advised to call with any health related questions or concerns.        Patient agreed to services and verbal consent obtained.   The patient verbalized understanding of instructions, educational materials, and care plan provided today and agreed to receive a mailed copy of patient instructions, educational materials, and care plan.   Cherylin Mylar, PharmD Clinical Pharmacist [facility] [phone]

## 2021-08-18 ENCOUNTER — Telehealth: Payer: Self-pay

## 2021-08-18 ENCOUNTER — Other Ambulatory Visit: Payer: Self-pay | Admitting: Nurse Practitioner

## 2021-08-18 ENCOUNTER — Ambulatory Visit: Payer: Medicare Other

## 2021-08-18 DIAGNOSIS — G2582 Stiff-man syndrome: Secondary | ICD-10-CM

## 2021-08-18 DIAGNOSIS — F32A Depression, unspecified: Secondary | ICD-10-CM

## 2021-08-18 DIAGNOSIS — J4520 Mild intermittent asthma, uncomplicated: Secondary | ICD-10-CM

## 2021-08-18 DIAGNOSIS — E1165 Type 2 diabetes mellitus with hyperglycemia: Secondary | ICD-10-CM
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
## 2021-08-18 NOTE — Telephone Encounter (Signed)
  Care Management   Follow Up Note   08/18/2021 Name: Shateka Petrea MRN: [id number] DOB: 05/09/1966   Referred by: Arnette Felts, FNP Reason for referral : Chronic Care Management (Unsuccessful call)   An unsuccessful telephone outreach was attempted today. The patient was referred to the case management team for assistance with care management and care coordination. HIPAA compliant voice message left requesting a return call.  Follow Up Plan: The care management team will reach out to the patient again over the next 21 days.   [name], BSW, CDP Social Worker, Certified Dementia Practitioner [facility] Care Management [phone]

## 2021-08-19 NOTE — Chronic Care Management (AMB) (Signed)
Chronic Care Management   CCM RN Visit Note  08/10/2021 Name: Sherri [name] MRN: [id number] DOB: [birth year]  Subjective: Sherri [name] is a 55 y.o. year old female who is a primary care patient of Minette Brine, [place]. The care management team was consulted for assistance with disease management and care coordination needs.    Engaged with patient by telephone for follow up visit in response to provider referral for case management and/or care coordination services.   Consent to Services:  The patient was given information about Chronic Care Management services, agreed to services, and gave verbal consent prior to initiation of services.  Please see initial visit note for detailed documentation.   Patient agreed to services and verbal consent obtained.   Assessment: Review of patient past medical history, allergies, medications, health status, including review of consultants reports, laboratory and other test data, was performed as part of comprehensive evaluation and provision of chronic care management services.   SDOH (Social Determinants of Health) assessments and interventions performed:  Yes, no acute needs   CCM Care Plan  Allergies  Allergen Reactions   Ibuprofen Other (See Comments)    Does not take due to hx of renal insufficiency "I have kidney disease"    Lemon Flavor Swelling    Severe Lip Swelling  FRUIT per pt.     Amoxicillin Diarrhea and Other (See Comments)   Tylenol [Acetaminophen] Hives    Cannot take large quantities    Outpatient Encounter Medications as of 08/10/2021  Medication Sig   albuterol (VENTOLIN HFA) 108 (90 Base) MCG/ACT inhaler TAKE 2 PUFFS BY MOUTH EVERY 6 HOURS AS NEEDED FOR WHEEZE OR SHORTNESS OF BREATH (Patient taking differently: Inhale 2 puffs into the lungs every 6 (six) hours as needed for wheezing or shortness of breath.)   Ascorbic Acid (VITAMIN C) 1000 MG tablet Take 1,000 mg by mouth daily.   baclofen (LIORESAL) 20 MG tablet Take 1  tablet 3 times daily   Cholecalciferol (VITAMIN D3) 10 MCG (400 UNIT) tablet Take 400 Units by mouth daily.   Continuous Blood Gluc Sensor (DEXCOM G6 SENSOR) MISC 1 Device by Does not apply route as directed.   Continuous Blood Gluc Transmit (DEXCOM G6 TRANSMITTER) MISC 1 Device by Does not apply route as directed.   Cranberry-Vitamin C-Vitamin E 4200-20-3 MG-MG-UNIT CAPS Take 1 tablet by mouth daily.   cyanocobalamin (,VITAMIN B-12,) 1000 MCG/ML injection Inject 1,000 mcg into the muscle every 3 (three) months.   dapagliflozin propanediol (FARXIGA) 5 MG TABS tablet Take 1 tablet (5 mg total) by mouth daily before breakfast.   desloratadine (CLARINEX) 5 MG tablet TAKE 1 TABLET BY MOUTH  DAILY   diazepam (VALIUM) 10 MG tablet TAKE 2 TABLETS BY MOUTH 3  TIMES DAILY as needed   DIAZEPAM, 20 MG DOSE, NA Place into the nose.   EPINEPHRINE 0.3 mg/0.3 mL IJ SOAJ injection INJECT INTRAMUSCULARLY 1  PEN AS NEEDED FOR ALLERGIC  RESPONSE AS DIRECTED BY MD. SEEK MEDICAL HELP AFTER  USE.   famotidine (PEPCID) 20 MG tablet TAKE 1 TABLET BY MOUTH TWICE A DAY   FLUoxetine (PROZAC) 10 MG capsule TAKE 1 CAPSULE BY MOUTH  DAILY (Patient taking differently: Take 10 mg by mouth at bedtime.)   gabapentin (NEURONTIN) 300 MG capsule TAKE 1 CAPSULE BY MOUTH TWO TIMES DAILY   Glucagon (GVOKE HYPOPEN 2-PACK) 0.5 MG/0.1ML SOAJ Inject 0.1 mLs into the skin as needed (if blood sugar less than 70).   glucose blood test strip Use as  instructed to check sugars qac and qhs   hydrOXYzine (VISTARIL) 100 MG capsule TAKE 1 CAPSULE BY MOUTH 3  TIMES DAILY AS NEEDED FOR  ITCHING (Patient taking differently: Take 100 mg by mouth 3 (three) times daily.)   insulin degludec (TRESIBA) 100 UNIT/ML FlexTouch Pen Inject 13 Units into the skin daily.   Insulin Lispro-aabc (LYUMJEV KWIKPEN) 100 UNIT/ML KwikPen Use as directed   Insulin Pen Needle 32G X 4 MM MISC 1 each by Does not apply route 2 (two) times daily. Dx E11.9   Olopatadine HCl 0.7 %  SOLN Place 1 drop into both eyes daily.   vitamin E 180 MG (400 UNITS) capsule Take 400 Units by mouth daily.    No facility-administered encounter medications on file as of 08/10/2021.    Patient Active Problem List   Diagnosis Date Noted   Hashimoto's disease 08/09/2021   Type 2 diabetes mellitus with hyperglycemia, with long-term current use of insulin ([place]) 08/09/2021   Type 2 diabetes mellitus with stage 3a chronic kidney disease, with long-term current use of insulin ([place]) 08/09/2021   Type 2 diabetes mellitus with diabetic polyneuropathy, with long-term current use of insulin ([place]) 08/09/2021   Hypoglycemia 05/05/2021   AKI (acute kidney injury) ([place]) 05/05/2021   Anxiety    Chronic rhinitis 04/24/2019   Keloid 10/10/2018   Diabetes type 2, controlled ([place]) 10/10/2018   Depression 01/02/2017   History of DVT (deep vein thrombosis) 01/02/2017   Chronic pain syndrome 01/02/2017   Cocaine abuse ([place]) 01/02/2017   Dysphagia    Adjustment insomnia    Distal radius fracture, right 08/16/2016   Postconcussion syndrome 12/30/2014   Tremor 03/11/2014   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   Hiatal hernia 03/21/2013   Anemia, iron deficiency 01/29/2013   Spasm of muscle 09/02/2012   Anemia 08/28/2012   Mild intermittent asthma 08/27/2012   Chronic back pain    Pernicious anemia 10/30/2011   DEGENERATIVE DISC DISEASE 08/18/2010   Asthma with bronchitis 07/28/2010   Recurrent urticaria 07/28/2010    Conditions to be addressed/monitored: DMII, Stiff-Person Syndrome, Asthma with bronchitis, GERD, Depression, Hashimoto's disease  Care Plan : Diabetes Type 2 (Adult)  Updates made by Lynne Logan, RN since 08/10/2021 12:00 AM     Problem: Glycemic Management (Diabetes, Type 2)   Priority: High     Long-Range Goal: Glycemic Management Optimized   Start Date: 01/31/2021  Expected End Date: 01/31/2022  Recent Progress: On track  Priority: High  Note:   Objective:  Lab Results   Component Value Date   HGBA1C 12.0 (A) 08/08/2021   Lab Results  Component Value Date   CREATININE 1.19 (H) 08/09/2021   CREATININE 1.11 08/08/2021   CREATININE 1.75 (H) 05/06/2021   Lab Results  Component Value Date   EGFR 73 (L) 03/22/2017  Current Barriers:  Knowledge Deficits related to basic Diabetes pathophysiology and self care/management Knowledge Deficits related to medications used for management of diabetes Difficulty obtaining or cannot afford medications Financial Constraints Limited Social Support Case Manager Clinical Goal(s):  Patient will demonstrate improved adherence to prescribed treatment plan for diabetes self care/management as evidenced by: daily monitoring and recording of CBG  adherence to ADA/ carb modified diet exercise 5 days/week adherence to prescribed medication regimen contacting provider for new or worsened symptoms or questions Interventions:  08/10/21 completed successful inbound call with patient  Collaboration with Minette Brine, [place] regarding development and update of comprehensive plan of care as evidenced by provider attestation and co-signature  Inter-disciplinary care team collaboration (see longitudinal plan of care) Provided education to patient about basic DM disease process Review of patient status, including review of consultant's reports, relevant laboratory and other test results, and medications completed. Reviewed and discussed the Assessment/Plan recommended by Dr. Kelton Pillar completed on 08/08/21; 1) Type 2 Diabetes Mellitus, Poorly controlled, With CKD III and neuropathic  complications - Most recent A1c of 12.0 %. Goal A1c < 7.0 %.   - I have discussed with the patient the pathophysiology of diabetes. We went over the natural progression of the disease. I explained the complications associated with diabetes including retinopathy, nephropathy, neuropathy as well as increased risk of cardiovascular disease. We went over the benefit seen  with glycemic control.  - Dexcom prescribed  - I am going to check her for Type 1 antibodies (GAD-65 and Islet cell Ab's ) especially with this a diagnosis of stiff person syndrome , I have high suspicion for Type 1 diabetes  - I have encouraged her to consider prandial insulin which she has agreed too  MEDICATIONS: - STOP Metformin - STOP Glipizide  - Continue Farxiga 10 mg daily  - Tresiba 12 units daily  - Humalog 3 units with each meal  EDUCATION / INSTRUCTIONS: BG monitoring instructions: Patient is instructed to check her blood sugars 3 times a day, before meals. Call [facility] if: BG persistently < 70  I reviewed the Rule of 15 for the treatment of hypoglycemia in detail with the patient. Literature supplied. 2) Diabetic complications:  Eye: Does not have known diabetic retinopathy.  Neuro/ Feet: Does  have known diabetic peripheral neuropathy. Renal: Patient does  have known baseline CKD. She is not on an ACEI/ARB at present. 3) Hashimoto's Disease:  - TFT's are normal.  - F/U in 3 months  Confirmed patient verbalizes understanding of her prescribed treatment plan laid out by Dr. Kelton Pillar  Reviewed medications with patient and discussed importance of medication adherence Educated patient on dietary and exercise recommendations; daily glycemic control FBS 80-130, <180 after meals;15'15' rule Advised patient, providing education and rationale, to check cbg daily before meals and at bedtime and record, calling the CCM team and or PCP for findings outside established parameters Discussed plans with patient for ongoing care management follow up and provided patient with direct contact information for care management team Self-Care Activities Self administers oral medications as prescribed Self administers injectable DM medications (Tresiba, Farxiga, Humalog) as prescribed Attends all scheduled provider appointments Checks blood sugars as prescribed and utilize  hyper and hypoglycemia protocol as needed Adheres to prescribed ADA/carb modified Patient Goals: - check blood sugar at prescribed times - check blood sugar before and after exercise - check blood sugar if I feel it is too high or too low - enter blood sugar readings and medication or insulin into daily log - take the blood sugar log to all doctor visits - take the blood sugar meter to all doctor visits - manage portion size - keep appointment with eye doctor - schedule appointment with eye doctor - check feet daily for cuts, sores or redness - do heel pump exercise 2 to 3 times each day - keep feet up while sitting - trim toenails straight across - wash and dry feet carefully every day - wear comfortable, cotton socks - wear comfortable, well-fitting shoes  Follow Up Plan: Telephone follow up appointment with care management team member scheduled for: 11/11/21       Care Plan : Frailty Syndrome (Adult)  Updates made by Lynne Logan, RN since 08/19/2021 12:00 AM     Problem: Malnutrition (Frailty)   Priority: High     Long-Range Goal: Malnutrition Prevented   Start Date: 01/31/2021  Expected End Date: 01/31/2022  Recent Progress: On track  Priority: High  Note:   Current Barriers:  Ineffective Self Health Maintenance Financial restraints  Currently UNABLE TO independently self manage needs related to chronic health conditions.  Knowledge Deficits related to short term plan for care coordination needs and long term plans for chronic disease management needs Clinical Goal(s):  Collaboration with Minette Brine, FNP regarding development and update of comprehensive plan of care as evidenced by provider attestation and co-signature Inter-disciplinary care team collaboration (see longitudinal plan of care) Patient will work with care management team to address care coordination and chronic disease management needs related to Disease Management Educational Needs Care  Coordination Medication Management and Education Psychosocial Support   Interventions:  01/31/21 Successful call completed with patient  Evaluation of current treatment plan related to Weight Loss self-management and patient's adherence to plan as established by provider. Collaboration with Minette Brine, FNP regarding development and update of comprehensive plan of care as evidenced by provider attestation       and co-signature Inter-disciplinary care team collaboration (see longitudinal plan of care) Determined patient continues to experience unintentional weight loss with current weight down to 86 lbs Assessed for adequate nutritional intake, patient states she is eating 3 meals a day but admits due to limited financial strain she may not always make good food choices  Determined patient is working with the embedded BSW for resources to help with purchasing food  Educated patient on potential physical cause for weight loss secondary to having uncontrolled DM with current A1c 12.3 Educated patient on ways to support caloric demands nutritionally and help prevent further loss of muscle mass, including meal planning, meal prepping and making homemade smoothies for diabetics  Provided patient with website link to explore health diabetic smoothie recipes, encouraged making a grocery list to include all ingredients needed  Instructed patient to weigh daily each morning after voiding and record weights Discussed GI referral per PCP is still pending but should processed within 7-10 days  Discussed plans with patient for ongoing care management follow up and provided patient with direct contact information for care management team Patient Goals/Self Care Activities:  Weigh daily first thing in the am after voiding and record weights Try meal planning, meal prepping and making grocery list with all ingredients needed Try making healthy diabetic smoothies at home Keep the CCM team and PCP informed of  progress with weight gain and report ongoing weigh loss  Follow up with GI MD per recommendations of PCP  Follow Up Plan: Telephone follow up appointment with care management team member scheduled for: 11/11/21       Problem: Functional Decline (Frailty)   Priority: High     Long-Range Goal: Functional Decline Minimized   Start Date: 01/31/2021  Expected End Date: 01/31/2022  Recent Progress: On track  Priority: High  Note:   Current Barriers:  Ineffective Self Health Maintenance Financial Restraints  Currently UNABLE TO independently self manage needs related to chronic health conditions.  Knowledge Deficits related to short term plan for care coordination needs and long term plans for chronic disease management needs Clinical Goal(s):  Collaboration with Minette Brine, FNP regarding development and update of comprehensive plan of care as evidenced by provider attestation and co-signature Inter-disciplinary care team collaboration (  see longitudinal plan of care) Patient will work with care management team to address care coordination and chronic disease management needs related to Disease Management Educational Needs Care Coordination Medication Management and Education Psychosocial Support   Interventions:  01/31/21 Successful call completed with patient  Evaluation of current treatment plan related to functional decline self-management and patient's adherence to plan as established by provider. Collaboration with Minette Brine, FNP regarding development and update of comprehensive plan of care as evidenced by provider attestation       and co-signature Inter-disciplinary care team collaboration (see longitudinal plan of care) Determined patient has requested to participate in outpatient PT for strengthening and endurance Determined a PT referral has been placed by PCP and is pending  Discussed plans with patient for ongoing care management follow up and provided patient with  direct contact information for care management team Patient Goals/Self Care Activities:  be active in the morning when feeling the strongest do the exercise that the therapist taught me set a daily activity goal start slowly and increase the amount of time every week  Follow up with outpatient PT per recommendations of PCP for strengthening and endurance  Follow Up Plan: Telephone follow up appointment with care management team member scheduled for: 11/11/21     Plan:Telephone follow up appointment with care management team member scheduled for:  11/11/21  Barb Merino, RN, BSN, CCM Care Management Coordinator [facility]  Direct Phone: [phone]

## 2021-08-19 NOTE — Patient Instructions (Signed)
Goals Addressed      Diabetes treatment optimized   On track    Timeframe:  Long-Range Goal Priority:  High Start Date: 01/31/21/                            Expected End Date:  01/31/22  Follow Up Date: 11/11/21  Continue to follow DM dietary and exercise recommendations Continue to monitor CBG's daily before meals and at bedtime Follow up with Endocrinology as directed Take all of your DM medications exactly as prescribed                           Increase My Muscle Strength       Timeframe:  Long-Range Goal Priority:  High Start Date:  01/31/21                           Expected End Date:  01/31/22                     Follow Up Date: 11/11/21   be active in the morning when feeling the strongest do the exercise that the therapist taught me set a daily activity goal start slowly and increase the amount of time every week  Follow up with outpatient PT per recommendations of PCP for strengthening and endurance  Why is this important?   Walking and easy exercises make you stronger. These also give you energy.  Every Sherri [name] bit helps, at any age.    Notes:      Regain weight to baseline and minimize risk for malnutrition       Timeframe:  Long-Range Goal Priority:  High Start Date: 01/31/21                            Expected End Date:  01/31/22  Follow Up Date: 11/11/21  Patient Goals/Self-Care Activities:  Weigh daily first thing in the am after voiding and record weights Try meal planning, meal prepping and making grocery list with all ingredients needed Try making healthy diabetic smoothies at home Keep the CCM team and PCP informed of progress with weight gain and report ongoing weigh loss  Follow up with GI MD per recommendations of PCP

## 2021-08-24 ENCOUNTER — Telehealth (INDEPENDENT_AMBULATORY_CARE_PROVIDER_SITE_OTHER): Payer: Medicare Other | Admitting: Nurse Practitioner

## 2021-08-24 ENCOUNTER — Other Ambulatory Visit: Payer: Self-pay

## 2021-08-24 DIAGNOSIS — N179 Acute kidney failure, unspecified: Secondary | ICD-10-CM

## 2021-08-24 DIAGNOSIS — J4520 Mild intermittent asthma, uncomplicated: Secondary | ICD-10-CM

## 2021-08-24 DIAGNOSIS — E1165 Type 2 diabetes mellitus with hyperglycemia: Secondary | ICD-10-CM

## 2021-08-24 DIAGNOSIS — F1410 Cocaine abuse, uncomplicated: Secondary | ICD-10-CM

## 2021-08-24 DIAGNOSIS — Z794 Long term (current) use of insulin: Secondary | ICD-10-CM | POA: Diagnosis not present

## 2021-08-24 DIAGNOSIS — F32A Depression, unspecified: Secondary | ICD-10-CM | POA: Diagnosis not present

## 2021-08-24 DIAGNOSIS — J45909 Unspecified asthma, uncomplicated: Secondary | ICD-10-CM | POA: Diagnosis not present

## 2021-08-24 DIAGNOSIS — E119 Type 2 diabetes mellitus without complications: Secondary | ICD-10-CM

## 2021-08-24 LAB — ISLET CELL AB SCREEN RFLX TO TITER: ISLET CELL ANTIBODY SCREEN: POSITIVE — AB

## 2021-08-24 LAB — ISLET CELL AB/R TITER: Islet Cell Ab Titer: 5120 JDF units — ABNORMAL HIGH

## 2021-08-24 LAB — GLUTAMIC ACID DECARBOXYLASE AUTO ABS: Glutamic Acid Decarb Ab: >250 IU/mL — ABNORMAL HIGH (ref <5)

## 2021-08-24 MED ORDER — VITAMIN C 1000 MG PO TABS: 1000.0000 mg | ORAL_TABLET | Freq: Every day | ORAL | 1 refills | Status: DC
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
## 2021-08-24 NOTE — Patient Instructions (Signed)
Depression Screening Depression screening is a tool that your health care provider can use to learn if you have symptoms of depression. Depression is a common condition with many symptoms that are also often found in other conditions. Depression is treatable, but it must first be diagnosed. You may not know that certain feelings, thoughts, and behaviors that you are having can be symptoms of depression. Taking a depression screening test can help you and your health care provider decide if you need more assessment, or if you should be referred to a mental health care provider. What are the screening tests? You may have a physical exam to see if another condition is affecting your mental health. You may have a blood or urine sample taken during the physical exam. You may be interviewed or offered a written test using a screening tool that was developed from research, such as one of these: Patient Health Questionnaire (PHQ). This is a set of either 2 or 9 questions. A health care provider who has been trained to score this screening test uses a guide to assess if your symptoms suggest that you may have depression. Donavyn Fecher Depression Rating Scale (HAM-D). This is a set of either 17 or 24 questions. You may be asked to take it again during or after your treatment, to see if your depression has gotten better. Beck Depression Inventory (BDI). This is a set of 21 multiple choice questions. Your health care provider scores your answers to assess: Your level of depression, ranging from mild to severe. Your response to treatment. Your health care provider may talk with you about your daily activities, such as eating, sleeping, work, and recreation, and ask if you have had any changes in activity. Your health care provider may ask you to see a mental health specialist, such as a psychiatrist or psychologist, for more evaluation. Who should be screened for depression?  All adults, including adults with a family  history of a mental health disorder. People who are 10-21 years old. People who are recovering from an acute condition, such as myocardial infarction (MI) or stroke. Pregnant women, or women who have given birth. People who have a long-term (chronic) illness. Anyone who has been diagnosed with another type of mental health disorder. Anyone who has symptoms that could show depression. What do my results mean? Your health care provider will review the results of your depression screening, physical exam, and lab tests. Positive screens suggest that you may have depression. Screening is the first step in getting the care that you may need. It will be important for you to know the results of your tests. Ask your health care provider, or the department that is doing your screening tests, when your results will be ready. Talk with your health care provider about your results, diagnosis, and recommendations for follow-up. A diagnosis of depression is made using information from the Diagnostic and Statistical Manual of Mental Disorders (DSM-5). This is a book that lists the number and type of symptoms that must be present for a health care provider to give a specific diagnosis. Your health care provider may work with you to treat your symptoms of depression, or your health care provider may help you find a mental health provider who can assess and help you develop a plan to treat your depression. Get help right away if: You have thoughts about hurting yourself or others. If you ever feel like you may hurt yourself or others, or have thoughts about taking your own life,   get help right away. Go to your nearest emergency department or: Call your local emergency services (911 in the [place] Call a suicide crisis helpline, such as the National Suicide Prevention Lifeline at 1-800-273-8255. This is open 24 hours a day in the [place] Text the Crisis Text Line at 741741 (in the [place] Summary Depression screening is  the first step in getting the help that you may need. If your screening test shows symptoms of depression (is positive), your health care provider may ask you to see a mental health provider who will help identify ways to treat your depression. Anyone aged 10 or older should be screened for depression. This information is not intended to replace advice given to you by your health care provider. Make sure you discuss any questions you have with your health care provider. Document Revised: 03/21/2021 Document Reviewed: 03/21/2021 Elsevier Patient Education  2022 Elsevier Inc.  

## 2021-08-26 ENCOUNTER — Other Ambulatory Visit: Payer: Self-pay | Admitting: Neurology

## 2021-08-31 ENCOUNTER — Telehealth: Payer: Self-pay

## 2021-08-31 NOTE — Telephone Encounter (Signed)
  Care Management   Follow Up Note   08/31/2021 Name: Marvin Maenza MRN: [id number] DOB: [birth year]   Referred by: Arnette Felts, FNP Reason for referral : Chronic Care Management (Unsuccessful call)   SW placed an unsuccessful outbound call to the patient in response to a voice message received. SW left a HIPAA compliant voice message requesting a return call.  Follow Up Plan: The care management team will reach out to the patient again over the next 21 days.   [name], BSW, CDP Social Worker, Certified Dementia Practitioner [facility] Care Management [phone]

## 2021-09-05 ENCOUNTER — Ambulatory Visit (HOSPITAL_COMMUNITY)
Admission: EM | Admit: 2021-09-05 | Discharge: 2021-09-05 | Disposition: A | Discharge status: Home / Self Care | Payer: Medicare Other | Attending: Internal Medicine | Admitting: Internal Medicine

## 2021-09-05 ENCOUNTER — Ambulatory Visit (HOSPITAL_COMMUNITY): Payer: Medicare Other

## 2021-09-05 ENCOUNTER — Other Ambulatory Visit: Payer: Self-pay

## 2021-09-05 ENCOUNTER — Encounter (HOSPITAL_COMMUNITY): Payer: Self-pay

## 2021-09-05 ENCOUNTER — Ambulatory Visit (INDEPENDENT_AMBULATORY_CARE_PROVIDER_SITE_OTHER): Payer: Medicare Other

## 2021-09-05 DIAGNOSIS — S0002XA Blister (nonthermal) of scalp, initial encounter: Secondary | ICD-10-CM | POA: Diagnosis not present

## 2021-09-05 DIAGNOSIS — M79641 Pain in right hand: Secondary | ICD-10-CM

## 2021-09-05 DIAGNOSIS — L089 Local infection of the skin and subcutaneous tissue, unspecified: Secondary | ICD-10-CM

## 2021-09-05 DIAGNOSIS — R220 Localized swelling, mass and lump, head: Secondary | ICD-10-CM

## 2021-09-05 DIAGNOSIS — M79644 Pain in right finger(s): Secondary | ICD-10-CM | POA: Diagnosis not present

## 2021-09-05 MED ORDER — ACETAMINOPHEN 500 MG PO TABS: 500.0000 mg | ORAL_TABLET | Freq: Four times a day (QID) | ORAL | 0 refills | Status: DC | PRN

## 2021-09-05 MED ORDER — ACETAMINOPHEN 325 MG PO TABS
ORAL_TABLET | ORAL | Status: AC
  Filled 2021-09-05: qty 2

## 2021-09-05 MED ORDER — DICLOFENAC SODIUM 1 % EX GEL: 2.0000 g | Freq: Four times a day (QID) | CUTANEOUS | 1 refills | Status: DC

## 2021-09-05 MED ORDER — ACETAMINOPHEN 325 MG PO TABS
650.0000 mg | ORAL_TABLET | Freq: Once | ORAL | Status: AC
  Administered 2021-09-05: 650 mg via ORAL

## 2021-09-05 MED ORDER — DOXYCYCLINE HYCLATE 100 MG PO CAPS: 100.0000 mg | ORAL_CAPSULE | Freq: Two times a day (BID) | ORAL | 0 refills | Status: DC
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
## 2021-09-05 NOTE — Discharge Instructions (Addendum)
Your x-ray today did not show injury to hand and your lip appears to be healing appropriately  The area on your scalp does show drainage indicating possible infection therefore you were treated with antibiotic please follow-up with your primary care doctor in the next 2 weeks for reevaluation  Take doxycycline twice a day for 7 days  Can use 500 mg of ibuprofen every 6 hours as needed  Can use diclofenac gel up to 4 times a day on neck and hand or any other affected area that is causing you discomfort

## 2021-09-06 ENCOUNTER — Telehealth: Payer: Medicare Other

## 2021-09-06 ENCOUNTER — Telehealth: Payer: Self-pay

## 2021-09-06 ENCOUNTER — Ambulatory Visit: Payer: Medicare Other

## 2021-09-06 ENCOUNTER — Ambulatory Visit: Payer: Self-pay

## 2021-09-06 DIAGNOSIS — E1165 Type 2 diabetes mellitus with hyperglycemia: Secondary | ICD-10-CM

## 2021-09-06 DIAGNOSIS — Z794 Long term (current) use of insulin: Secondary | ICD-10-CM

## 2021-09-06 DIAGNOSIS — E063 Autoimmune thyroiditis: Secondary | ICD-10-CM

## 2021-09-06 DIAGNOSIS — J45909 Unspecified asthma, uncomplicated: Secondary | ICD-10-CM

## 2021-09-06 DIAGNOSIS — G2582 Stiff-man syndrome: Secondary | ICD-10-CM

## 2021-09-06 DIAGNOSIS — F32A Depression, unspecified: Secondary | ICD-10-CM

## 2021-09-06 DIAGNOSIS — K219 Gastro-esophageal reflux disease without esophagitis: Secondary | ICD-10-CM

## 2021-09-06 NOTE — Telephone Encounter (Signed)
Patient has been given directions on correction scale for Lyumjev. Also sent in mychart message as patient was unable to write down. Patient verbalized understanding.

## 2021-09-06 NOTE — Telephone Encounter (Addendum)
Sherri [name] states that patient sugar was 541 20 minutes ago 2:51 pm she is at 490 with 2 units of [place]  Patient has not had any Lyumjev today. Patient took the last of the [place] until see gets new script from pharmacy. Wants to know how much Lyumjev to take to get sugar down.

## 2021-09-06 NOTE — Patient Instructions (Signed)
Goals Addressed      Diabetes treatment optimized   Not on track    Timeframe:  Long-Range Goal Priority:  High Start Date: 01/31/21/                            Expected End Date:  01/31/22  Follow Up Date: 11/11/21  Continue to follow DM dietary and exercise recommendations Continue to monitor CBG's daily before meals and at bedtime Follow up with Endocrinology as directed Take all of your DM medications exactly as prescribed
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
## 2021-09-08 ENCOUNTER — Emergency Department (HOSPITAL_COMMUNITY): Payer: Medicare Other

## 2021-09-08 ENCOUNTER — Ambulatory Visit: Payer: Self-pay

## 2021-09-08 ENCOUNTER — Telehealth: Payer: Medicare Other

## 2021-09-08 ENCOUNTER — Observation Stay (HOSPITAL_COMMUNITY): Payer: Medicare Other

## 2021-09-08 ENCOUNTER — Inpatient Hospital Stay (HOSPITAL_COMMUNITY)
Admission: EM | Admit: 2021-09-08 | Discharge: 2021-09-11 | DRG: 637 | Disposition: A | Discharge status: Home / Self Care | Payer: Medicare Other | Attending: Hospitalist | Admitting: Hospitalist

## 2021-09-08 ENCOUNTER — Telehealth: Payer: Self-pay

## 2021-09-08 ENCOUNTER — Encounter (HOSPITAL_COMMUNITY): Payer: Self-pay

## 2021-09-08 DIAGNOSIS — R4182 Altered mental status, unspecified: Secondary | ICD-10-CM | POA: Diagnosis not present

## 2021-09-08 DIAGNOSIS — N390 Urinary tract infection, site not specified: Secondary | ICD-10-CM | POA: Diagnosis not present

## 2021-09-08 DIAGNOSIS — Z8701 Personal history of pneumonia (recurrent): Secondary | ICD-10-CM

## 2021-09-08 DIAGNOSIS — Z794 Long term (current) use of insulin: Secondary | ICD-10-CM

## 2021-09-08 DIAGNOSIS — E1100 Type 2 diabetes mellitus with hyperosmolarity without nonketotic hyperglycemic-hyperosmolar coma (NKHHC): Secondary | ICD-10-CM

## 2021-09-08 DIAGNOSIS — R7989 Other specified abnormal findings of blood chemistry: Secondary | ICD-10-CM

## 2021-09-08 DIAGNOSIS — F1410 Cocaine abuse, uncomplicated: Secondary | ICD-10-CM | POA: Diagnosis present

## 2021-09-08 DIAGNOSIS — F419 Anxiety disorder, unspecified: Secondary | ICD-10-CM | POA: Diagnosis not present

## 2021-09-08 DIAGNOSIS — E162 Hypoglycemia, unspecified: Secondary | ICD-10-CM

## 2021-09-08 DIAGNOSIS — R739 Hyperglycemia, unspecified: Secondary | ICD-10-CM | POA: Diagnosis not present

## 2021-09-08 DIAGNOSIS — Z886 Allergy status to analgesic agent status: Secondary | ICD-10-CM

## 2021-09-08 DIAGNOSIS — Z23 Encounter for immunization: Secondary | ICD-10-CM

## 2021-09-08 DIAGNOSIS — E440 Moderate protein-calorie malnutrition: Secondary | ICD-10-CM | POA: Diagnosis present

## 2021-09-08 DIAGNOSIS — G894 Chronic pain syndrome: Secondary | ICD-10-CM | POA: Diagnosis present

## 2021-09-08 DIAGNOSIS — M50322 Other cervical disc degeneration at C5-C6 level: Secondary | ICD-10-CM | POA: Diagnosis not present

## 2021-09-08 DIAGNOSIS — N1831 Chronic kidney disease, stage 3a: Secondary | ICD-10-CM | POA: Diagnosis present

## 2021-09-08 DIAGNOSIS — K449 Diaphragmatic hernia without obstruction or gangrene: Secondary | ICD-10-CM | POA: Diagnosis not present

## 2021-09-08 DIAGNOSIS — E1022 Type 1 diabetes mellitus with diabetic chronic kidney disease: Secondary | ICD-10-CM | POA: Diagnosis present

## 2021-09-08 DIAGNOSIS — A749 Chlamydial infection, unspecified: Secondary | ICD-10-CM | POA: Diagnosis present

## 2021-09-08 DIAGNOSIS — K219 Gastro-esophageal reflux disease without esophagitis: Secondary | ICD-10-CM | POA: Diagnosis present

## 2021-09-08 DIAGNOSIS — M50323 Other cervical disc degeneration at C6-C7 level: Secondary | ICD-10-CM | POA: Diagnosis not present

## 2021-09-08 DIAGNOSIS — G9340 Encephalopathy, unspecified: Secondary | ICD-10-CM

## 2021-09-08 DIAGNOSIS — R748 Abnormal levels of other serum enzymes: Secondary | ICD-10-CM | POA: Diagnosis not present

## 2021-09-08 DIAGNOSIS — D649 Anemia, unspecified: Secondary | ICD-10-CM

## 2021-09-08 DIAGNOSIS — H538 Other visual disturbances: Secondary | ICD-10-CM | POA: Diagnosis not present

## 2021-09-08 DIAGNOSIS — Z28310 Unvaccinated for covid-19: Secondary | ICD-10-CM

## 2021-09-08 DIAGNOSIS — E860 Dehydration: Secondary | ICD-10-CM | POA: Diagnosis present

## 2021-09-08 DIAGNOSIS — F32A Depression, unspecified: Secondary | ICD-10-CM

## 2021-09-08 DIAGNOSIS — E1122 Type 2 diabetes mellitus with diabetic chronic kidney disease: Secondary | ICD-10-CM

## 2021-09-08 DIAGNOSIS — E063 Autoimmune thyroiditis: Secondary | ICD-10-CM

## 2021-09-08 DIAGNOSIS — M1990 Unspecified osteoarthritis, unspecified site: Secondary | ICD-10-CM | POA: Diagnosis present

## 2021-09-08 DIAGNOSIS — E1065 Type 1 diabetes mellitus with hyperglycemia: Principal | ICD-10-CM | POA: Diagnosis present

## 2021-09-08 DIAGNOSIS — Z825 Family history of asthma and other chronic lower respiratory diseases: Secondary | ICD-10-CM

## 2021-09-08 DIAGNOSIS — E1165 Type 2 diabetes mellitus with hyperglycemia: Secondary | ICD-10-CM

## 2021-09-08 DIAGNOSIS — Z20822 Contact with and (suspected) exposure to covid-19: Secondary | ICD-10-CM | POA: Diagnosis present

## 2021-09-08 DIAGNOSIS — Z880 Allergy status to penicillin: Secondary | ICD-10-CM

## 2021-09-08 DIAGNOSIS — Z043 Encounter for examination and observation following other accident: Secondary | ICD-10-CM | POA: Diagnosis not present

## 2021-09-08 DIAGNOSIS — G9341 Metabolic encephalopathy: Secondary | ICD-10-CM | POA: Diagnosis present

## 2021-09-08 DIAGNOSIS — I619 Nontraumatic intracerebral hemorrhage, unspecified: Secondary | ICD-10-CM | POA: Diagnosis not present

## 2021-09-08 DIAGNOSIS — S0993XA Unspecified injury of face, initial encounter: Secondary | ICD-10-CM | POA: Diagnosis not present

## 2021-09-08 DIAGNOSIS — Z9114 Patient's other noncompliance with medication regimen: Secondary | ICD-10-CM

## 2021-09-08 DIAGNOSIS — D631 Anemia in chronic kidney disease: Secondary | ICD-10-CM | POA: Diagnosis present

## 2021-09-08 DIAGNOSIS — N3000 Acute cystitis without hematuria: Secondary | ICD-10-CM

## 2021-09-08 DIAGNOSIS — Z79899 Other long term (current) drug therapy: Secondary | ICD-10-CM

## 2021-09-08 DIAGNOSIS — R9431 Abnormal electrocardiogram [ECG] [EKG]: Secondary | ICD-10-CM | POA: Diagnosis not present

## 2021-09-08 DIAGNOSIS — J45909 Unspecified asthma, uncomplicated: Secondary | ICD-10-CM | POA: Diagnosis present

## 2021-09-08 DIAGNOSIS — R0902 Hypoxemia: Secondary | ICD-10-CM | POA: Diagnosis not present

## 2021-09-08 DIAGNOSIS — E1051 Type 1 diabetes mellitus with diabetic peripheral angiopathy without gangrene: Secondary | ICD-10-CM | POA: Diagnosis present

## 2021-09-08 DIAGNOSIS — S199XXA Unspecified injury of neck, initial encounter: Secondary | ICD-10-CM | POA: Diagnosis not present

## 2021-09-08 DIAGNOSIS — Z91018 Allergy to other foods: Secondary | ICD-10-CM

## 2021-09-08 DIAGNOSIS — R296 Repeated falls: Secondary | ICD-10-CM | POA: Diagnosis present

## 2021-09-08 DIAGNOSIS — G2582 Stiff-man syndrome: Secondary | ICD-10-CM | POA: Diagnosis present

## 2021-09-08 DIAGNOSIS — R531 Weakness: Secondary | ICD-10-CM | POA: Diagnosis not present

## 2021-09-08 DIAGNOSIS — R410 Disorientation, unspecified: Secondary | ICD-10-CM | POA: Diagnosis not present

## 2021-09-08 DIAGNOSIS — Z681 Body mass index (BMI) 19 or less, adult: Secondary | ICD-10-CM

## 2021-09-08 LAB — CBG MONITORING, ED
Glucose-Capillary: 555 mg/dL (ref 70–99)
Glucose-Capillary: 309 mg/dL — ABNORMAL HIGH (ref 70–99)
Glucose-Capillary: 73 mg/dL (ref 70–99)

## 2021-09-08 LAB — COMPREHENSIVE METABOLIC PANEL
ALT: 106 U/L — ABNORMAL HIGH (ref 0–44)
AST: 60 U/L — ABNORMAL HIGH (ref 15–41)
Albumin: 3.5 g/dL (ref 3.5–5.0)
Alkaline Phosphatase: 107 U/L (ref 38–126)
Anion gap: 6 (ref 5–15)
BUN: 20 mg/dL (ref 6–20)
CO2: 26 mmol/L (ref 22–32)
Calcium: 9.4 mg/dL (ref 8.9–10.3)
Chloride: 101 mmol/L (ref 98–111)
Creatinine, Ser: 1.24 mg/dL — ABNORMAL HIGH (ref 0.44–1.00)
GFR, Estimated: 51 mL/min — ABNORMAL LOW (ref >60)
Glucose, Bld: 554 mg/dL (ref 70–99)
Potassium: 4.3 mmol/L (ref 3.5–5.1)
Sodium: 133 mmol/L — ABNORMAL LOW (ref 135–145)
Total Bilirubin: 0.6 mg/dL (ref 0.3–1.2)
Total Protein: 6.8 g/dL (ref 6.5–8.1)

## 2021-09-08 LAB — URINALYSIS, ROUTINE W REFLEX MICROSCOPIC
Bilirubin Urine: NEGATIVE
Glucose, UA: >=500 mg/dL — AB
Hgb urine dipstick: NEGATIVE
Ketones, ur: NEGATIVE mg/dL
Nitrite: NEGATIVE
Protein, ur: NEGATIVE mg/dL
RBC / HPF: >50 RBC/hpf — ABNORMAL HIGH (ref 0–5)
Specific Gravity, Urine: 1.027 (ref 1.005–1.030)
WBC, UA: >50 WBC/hpf — ABNORMAL HIGH (ref 0–5)
pH: 6 (ref 5.0–8.0)

## 2021-09-08 LAB — LIPASE, BLOOD: Lipase: 75 U/L — ABNORMAL HIGH (ref 11–51)

## 2021-09-08 LAB — CBC WITH DIFFERENTIAL/PLATELET
Abs Immature Granulocytes: 0.02 10*3/uL (ref 0.00–0.07)
Basophils Absolute: 0 10*3/uL (ref 0.0–0.1)
Basophils Relative: 0 %
Eosinophils Absolute: 0 10*3/uL (ref 0.0–0.5)
Eosinophils Relative: 0 %
HCT: 35 % — ABNORMAL LOW (ref 36.0–46.0)
Hemoglobin: 11.1 g/dL — ABNORMAL LOW (ref 12.0–15.0)
Immature Granulocytes: 0 %
Lymphocytes Relative: 32 %
Lymphs Abs: 1.8 10*3/uL (ref 0.7–4.0)
MCH: 27.5 pg (ref 26.0–34.0)
MCHC: 31.7 g/dL (ref 30.0–36.0)
MCV: 86.6 fL (ref 80.0–100.0)
Monocytes Absolute: 0.4 10*3/uL (ref 0.1–1.0)
Monocytes Relative: 7 %
Neutro Abs: 3.4 10*3/uL (ref 1.7–7.7)
Neutrophils Relative %: 61 %
Platelets: 256 10*3/uL (ref 150–400)
RBC: 4.04 MIL/uL (ref 3.87–5.11)
RDW: 11.8 % (ref 11.5–15.5)
WBC: 5.7 10*3/uL (ref 4.0–10.5)
nRBC: 0 % (ref 0.0–0.2)

## 2021-09-08 LAB — OSMOLALITY: Osmolality: 309 mOsm/kg — ABNORMAL HIGH (ref 275–295)

## 2021-09-08 LAB — BLOOD GAS, VENOUS
Acid-base deficit: 1.4 mmol/L (ref 0.0–2.0)
Bicarbonate: 24.9 mmol/L (ref 20.0–28.0)
FIO2: 21
O2 Saturation: 65.7 %
Patient temperature: 98.6
pCO2, Ven: 52.2 mmHg (ref 44.0–60.0)
pH, Ven: 7.301 (ref 7.250–7.430)
pO2, Ven: 40.3 mmHg (ref 32.0–45.0)

## 2021-09-08 LAB — PROTIME-INR
INR: 0.9 (ref 0.8–1.2)
Prothrombin Time: 12 seconds (ref 11.4–15.2)

## 2021-09-08 LAB — RESP PANEL BY RT-PCR (FLU A&B, COVID) ARPGX2
Influenza A by PCR: NEGATIVE
Influenza B by PCR: NEGATIVE
SARS Coronavirus 2 by RT PCR: NEGATIVE

## 2021-09-08 LAB — APTT: aPTT: 26 seconds (ref 24–36)

## 2021-09-08 LAB — RAPID URINE DRUG SCREEN, HOSP PERFORMED
Amphetamines: NOT DETECTED
Barbiturates: NOT DETECTED
Benzodiazepines: POSITIVE — AB
Cocaine: NOT DETECTED
Opiates: NOT DETECTED
Tetrahydrocannabinol: NOT DETECTED

## 2021-09-08 LAB — LACTIC ACID, PLASMA: Lactic Acid, Venous: 1.9 mmol/L (ref 0.5–1.9)

## 2021-09-08 LAB — ACETAMINOPHEN LEVEL: Acetaminophen (Tylenol), Serum: <10 ug/mL — ABNORMAL LOW (ref 10–30)

## 2021-09-08 LAB — SALICYLATE LEVEL: Salicylate Lvl: <7 mg/dL — ABNORMAL LOW (ref 7.0–30.0)

## 2021-09-08 LAB — ETHANOL: Alcohol, Ethyl (B): <10 mg/dL (ref <10)

## 2021-09-08 LAB — BETA-HYDROXYBUTYRIC ACID: Beta-Hydroxybutyric Acid: 0.22 mmol/L (ref 0.05–0.27)

## 2021-09-08 MED ORDER — INSULIN ASPART 100 UNIT/ML IJ SOLN
8.0000 [IU] | Freq: Once | INTRAMUSCULAR | Status: AC
  Administered 2021-09-08: 8 [IU] via SUBCUTANEOUS
  Filled 2021-09-08: qty 0.08

## 2021-09-08 MED ORDER — SODIUM CHLORIDE 0.9 % IV SOLN
1.0000 g | INTRAVENOUS | Status: DC
  Administered 2021-09-09: 1 g via INTRAVENOUS
  Filled 2021-09-08: qty 1

## 2021-09-08 MED ORDER — TETANUS-DIPHTH-ACELL PERTUSSIS 5-2.5-18.5 LF-MCG/0.5 IM SUSY
0.5000 mL | PREFILLED_SYRINGE | Freq: Once | INTRAMUSCULAR | Status: AC
  Administered 2021-09-08: 0.5 mL via INTRAMUSCULAR
  Filled 2021-09-08: qty 0.5

## 2021-09-08 MED ORDER — SODIUM CHLORIDE 0.9 % IV SOLN
75.0000 mL/h | INTRAVENOUS | Status: DC
  Administered 2021-09-09: 75 mL/h via INTRAVENOUS

## 2021-09-08 MED ORDER — SODIUM CHLORIDE 0.9 % IV SOLN
1.0000 g | Freq: Once | INTRAVENOUS | Status: AC
  Administered 2021-09-08: 1 g via INTRAVENOUS
  Filled 2021-09-08: qty 10

## 2021-09-08 MED ORDER — LORAZEPAM 2 MG/ML IJ SOLN
1.0000 mg | Freq: Once | INTRAMUSCULAR | Status: AC
  Administered 2021-09-08: 1 mg via INTRAVENOUS
  Filled 2021-09-08: qty 1

## 2021-09-08 MED ORDER — SODIUM CHLORIDE 0.9 % IV BOLUS
1000.0000 mL | Freq: Once | INTRAVENOUS | Status: AC
Start: 2021-09-08 — End: 2021-09-08
  Administered 2021-09-08: 1000 mL via INTRAVENOUS

## 2021-09-08 MED ORDER — INSULIN ASPART 100 UNIT/ML IJ SOLN
0.0000 [IU] | INTRAMUSCULAR | Status: DC
Start: 2021-09-08 — End: 2021-09-09
  Administered 2021-09-09: 1 [IU] via SUBCUTANEOUS
  Filled 2021-09-08: qty 0.09

## 2021-09-08 NOTE — ED Notes (Addendum)
Patient Nurse ask me to put Sherri [name] on the bed pan as I was able to do that it was making her feel uncomfortable. So I let my Nurse that she was wanted to be on a portable commode which a great success. As I was preparing to let to collect the urine I needed a flat space in order to get a clean catch she wanted to watch me pour because in her words " I Don't Trust You".

## 2021-09-08 NOTE — Telephone Encounter (Signed)
  Care Management   Follow Up Note   09/08/2021 Name: Stephanie Littman MRN: [id number] DOB: 04-23-1966   Referred by: Arnette Felts, FNP Reason for referral : Chronic Care Management   Collaboration with RN Care Manager who requests SW contact 911 on behalf of the patient. Patient is currently on the phone with RN Care Manager with slurred speech and incoherent. SW placed a call to 911 who has sent a paramedic to the patients home.   [name], BSW, CDP Social Worker, Certified Dementia Practitioner [facility] Care Management [phone]

## 2021-09-08 NOTE — ED Triage Notes (Signed)
Pt presents to the ED for hyperglycemia, and weakness which began this morning. Pt c/o blurred vision which began this morning, pt is unsure the time as well as left leg numbness.  Per EMS, pt has had "difficulty finding her words" this is per the pt's sister who checked on the pt earlier today. Per EMS, pt's last known normal was yesterday. Pt states she took her insulin this morning however an unknown amount. Pt is a poor historian.

## 2021-09-08 NOTE — ED Provider Notes (Signed)
[facility] DEPT Provider Note   CSN: [id number] Arrival date & time: 09/08/21  1446    History Chief Complaint  Patient presents with   Hyperglycemia    Sherri [name] is a 55 y.o. female with extensive past medical history, see below who presents for evaluation of altered mental status.  Unknown last known well. Patient states she hurts everywhere.  Apparently had some tingling to the bottom of her left foot yesterday.  Patient is confused.  States the year is 88.  She has obvious traumatic injuries to the right side of her face.  Does admit to substance use however has difficulty finding her words and cannot say exactly what she took.  EMS arrival patient with difficulty with word finding, CBG read high.  Through chart review.  Seen by urgent care 3 days ago for possible fall.  Did not have head imaging at that time.  Chart review has history of cocaine use as well as CKD.  EMS said sister called for a well check however per chart review it seems patients physicians office was calling to check on her and noted slurred speech and subsequently called EMS  Level 5 caveat-altered mental status  Discussed with family Collene Gobble [phone]. Updated on plan  HPI     Past Medical History:  Diagnosis Date   Angioedema    Anxiety    Arthritis    Asthma    Benzodiazepine withdrawal ([place]) 08/30/2012   CAP (community acquired pneumonia) 08/29/2012   Chronic back pain    Chronic kidney disease    Closed nondisplaced fracture of proximal phalanx of right little finger 10/18/2018   Cocaine abuse ([place])    Depression    Diarrhea    DVT (deep venous thrombosis) (HCC)    Environmental allergies    Fall    GERD (gastroesophageal reflux disease)    Heart murmur    has been told once that she has a heart murmur, but has never had any problems   Hiatal hernia 03/21/2013   Lumbar herniated disc    Peripheral vascular disease ([place])    Pernicious anemia  10/30/2011   Postconcussion syndrome 12/30/2014   Rhabdomyolysis 08/27/2012   Seasonal allergies    Stiff person syndrome    Urticaria     Patient Active Problem List   Diagnosis Date Noted   Acute encephalopathy 09/08/2021   Acute lower UTI 09/08/2021   Elevated LFTs 09/08/2021   Elevated lipase 09/08/2021   Hashimoto's disease 08/09/2021   Type 2 diabetes mellitus with hyperglycemia, with long-term current use of insulin ([place]) 08/09/2021   Type 2 diabetes mellitus with stage 3a chronic kidney disease, with long-term current use of insulin ([place]) 08/09/2021   Type 2 diabetes mellitus with diabetic polyneuropathy, with long-term current use of insulin ([place]) 08/09/2021   Hypoglycemia 05/05/2021   AKI (acute kidney injury) ([place]) 05/05/2021   Anxiety    Chronic rhinitis 04/24/2019   Keloid 10/10/2018   Diabetes type 2, controlled ([place]) 10/10/2018   Depression 01/02/2017   History of DVT (deep vein thrombosis) 01/02/2017   Chronic pain syndrome 01/02/2017   Cocaine abuse ([place]) 01/02/2017   Dysphagia    Adjustment insomnia    Distal radius fracture, right 08/16/2016   Postconcussion syndrome 12/30/2014   Tremor 03/11/2014   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   Hiatal hernia 03/21/2013   Anemia, iron deficiency 01/29/2013   Spasm of muscle 09/02/2012   Anemia 08/28/2012   Mild intermittent asthma 08/27/2012  Chronic back pain    Pernicious anemia 10/30/2011   DEGENERATIVE [place] DISEASE 08/18/2010   Asthma with bronchitis 07/28/2010   Recurrent urticaria 07/28/2010    Past Surgical History:  Procedure Laterality Date   BUNIONECTOMY Left    colonoscopy  07/09/15   inguinal hernia 1983  1983   OPEN REDUCTION INTERNAL FIXATION (ORIF) DISTAL RADIAL FRACTURE Right 08/16/2016   Procedure: OPEN REDUCTION INTERNAL FIXATION (ORIF) DISTAL RADIAL FRACTURE;  Surgeon: Marybelle Killings, MD;  Location: [facility];  Service: Orthopedics;  Laterality: Right;     OB History     Gravida  1   Para  1    Term  1   Preterm  0   AB  0   Living  1      SAB      IAB      Ectopic      Multiple      Live Births              Family History  Problem Relation Age of Onset   Cancer Mother    Other Mother    COPD Father    Asthma Brother    Cancer Brother        colon   Heart attack Brother    Seizures Brother    Allergic rhinitis Neg Hx    Angioedema Neg Hx    Eczema Neg Hx    Immunodeficiency Neg Hx    Urticaria Neg Hx     Social History   Tobacco Use   Smoking status: Never   Smokeless tobacco: Never   Tobacco comments:    Never Used Tobacco  Vaping Use   Vaping Use: Never used  Substance Use Topics   Alcohol use: Not Currently    Alcohol/week: 0.0 standard drinks    Comment: Occasional   Drug use: Not Currently    Types: Cocaine    Home Medications Prior to Admission medications   Medication Sig Start Date End Date Taking? Authorizing Provider  acetaminophen (TYLENOL) 500 MG tablet Take 1 tablet (500 mg total) by mouth every 6 (six) hours as needed. 09/05/21   White, Leitha Schuller, NP  albuterol (VENTOLIN HFA) 108 (90 Base) MCG/ACT inhaler TAKE 2 PUFFS BY MOUTH EVERY 6 HOURS AS NEEDED FOR WHEEZE OR SHORTNESS OF BREATH Patient taking differently: Inhale 2 puffs into the lungs every 6 (six) hours as needed for wheezing or shortness of breath. 03/25/21   Minette Brine, FNP  Ascorbic Acid (VITAMIN C) 1000 MG tablet Take 1 tablet (1,000 mg total) by mouth daily. 08/24/21   Minette Brine, FNP  baclofen (LIORESAL) 20 MG tablet Take 1 tablet 3 times daily 06/21/21   Suzzanne Cloud, NP  Cholecalciferol (VITAMIN D3) 10 MCG (400 UNIT) tablet Take 400 Units by mouth daily.    [provider]  Continuous Blood Gluc Sensor (DEXCOM G6 SENSOR) MISC 1 Device by Does not apply route as directed. 08/08/21   [name], Melanie Crazier, MD  Continuous Blood Gluc Transmit (DEXCOM G6 TRANSMITTER) MISC 1 Device by Does not apply route as directed. 08/08/21   [name],  Melanie Crazier, MD  Cranberry-Vitamin C-Vitamin E 4200-20-3 MG-MG-UNIT CAPS Take 1 tablet by mouth daily.    [provider]  cyanocobalamin (,VITAMIN B-12,) 1000 MCG/ML injection Inject 1,000 mcg into the muscle every 3 (three) months.    [provider]  dapagliflozin propanediol (FARXIGA) 5 MG TABS tablet Take 1 tablet (5 mg total) by mouth daily  before breakfast. 03/01/21   Minette Brine, FNP  desloratadine (CLARINEX) 5 MG tablet TAKE 1 TABLET BY MOUTH  DAILY 07/11/21   Minette Brine, FNP  diazepam (VALIUM) 10 MG tablet TAKE 2 TABLETS BY MOUTH 3  TIMES DAILY as needed 05/06/21   Lorella Nimrod, MD  DIAZEPAM, 20 MG DOSE, NA Place into the nose.    [provider]  diclofenac Sodium (VOLTAREN) 1 % GEL Apply 2 g topically 4 (four) times daily. 09/05/21   Hans Eden, NP  doxycycline (VIBRAMYCIN) 100 MG capsule Take 1 capsule (100 mg total) by mouth 2 (two) times daily. 09/05/21   White, Adrienne R, NP  EPINEPHRINE 0.3 mg/0.3 mL IJ SOAJ injection INJECT INTRAMUSCULARLY 1  PEN AS NEEDED FOR ALLERGIC  RESPONSE AS DIRECTED BY MD. Kirke Corin MEDICAL HELP AFTER  USE. 07/11/21   Minette Brine, FNP  famotidine (PEPCID) 20 MG tablet TAKE 1 TABLET BY MOUTH TWICE A DAY 07/06/21   Minette Brine, FNP  FLUoxetine (PROZAC) 10 MG capsule TAKE 1 CAPSULE BY MOUTH  DAILY Patient taking differently: Take 10 mg by mouth at bedtime. 03/01/21   Suzzanne Cloud, NP  gabapentin (NEURONTIN) 300 MG capsule TAKE 1 CAPSULE BY MOUTH TWO TIMES DAILY 06/21/21   Suzzanne Cloud, NP  Glucagon (GVOKE HYPOPEN 2-PACK) 0.5 MG/0.1ML SOAJ Inject 0.1 mLs into the skin as needed (if blood sugar less than 70). 05/19/21   Minette Brine, FNP  glucose blood test strip Use as instructed to check sugars qac and qhs 05/27/21   Ghumman, Ramandeep, NP  hydrOXYzine (VISTARIL) 100 MG capsule TAKE 1 CAPSULE BY MOUTH 3  TIMES DAILY AS NEEDED FOR  ITCHING Patient taking differently: Take 100 mg by mouth 3 (three) times daily. 03/01/21   Minette Brine, FNP  insulin degludec (TRESIBA) 100 UNIT/ML FlexTouch Pen Inject 13 Units into the skin daily.    [provider]  Insulin Lispro-aabc (LYUMJEV KWIKPEN) 100 UNIT/ML KwikPen Use as directed 08/08/21   [name], Melanie Crazier, MD  Insulin Pen Needle 32G X 4 MM MISC 1 each by Does not apply route 2 (two) times daily. Dx E11.9 05/27/21   Minette Brine, FNP  Olopatadine HCl 0.7 % SOLN Place 1 drop into both eyes daily. Patient not taking: Reported on 08/24/2021    [provider]  vitamin E 180 MG (400 UNITS) capsule Take 400 Units by mouth daily.     [provider]    Allergies    Ibuprofen, Lemon flavor, Amoxicillin, and Tylenol [acetaminophen]  Review of Systems   Review of Systems  Unable to perform ROS: Mental status change   Physical Exam Updated Vital Signs BP 112/77   Pulse 74   Temp 97.7 F (36.5 C) (Oral)   Resp 19   LMP 07/29/2012 (Approximate)   SpO2 99%   Physical Exam Vitals and nursing note reviewed. Exam conducted with a chaperone present.  Constitutional:      General: She is not in acute distress.    Appearance: She is well-developed. She is ill-appearing. She is not toxic-appearing or diaphoretic.  HENT:     Head:     Comments: Contusion to your right periorbital area, right lip, scabbed over    Ears:     Comments: Hematoma to right auricle    Nose:     Comments: No septal hematoma    Mouth/Throat:     Comments: Dentition however no loose teeth, tongue midline Eyes:     Pupils: Pupils are  equal, round, and reactive to light.     Comments: PERRLA, no pain with movement  Neck:     Trachea: Trachea normal.     Comments: Full ROM without difficulty. No midline cervical tenderness Cardiovascular:     Rate and Rhythm: Normal rate.     Pulses: Normal pulses.          Radial pulses are 2+ on the right side and 2+ on the left side.       Dorsalis pedis pulses are 2+ on the right side and 2+ on the left side.     Heart sounds:  Normal heart sounds.  Pulmonary:     Effort: Pulmonary effort is normal. No respiratory distress.     Breath sounds: Normal breath sounds and air entry.     Comments: Clear bilaterally, speaks without respiratory distress Chest:     Comments: Anterior posterior chest wall nontender without crepitus Abdominal:     General: Bowel sounds are normal. There is no distension.     Palpations: Abdomen is soft.     Tenderness: There is no abdominal tenderness. There is no right CVA tenderness, left CVA tenderness, guarding or rebound.     Hernia: No hernia is present.     Comments: Soft, nontender without rebound or guarding  Musculoskeletal:        General: Normal range of motion.     Cervical back: Full passive range of motion without pain and normal range of motion.     Comments: Bony tenderness.  Moves all 4 extremities out difficulty.  No shortening or rotation of legs.  Right wrist in splint, nontender when removed.  Full range of motion  Skin:    General: Skin is warm and dry.     Capillary Refill: Capillary refill takes 2 to 3 seconds.     Comments: No obvious abscess, cellulitis  Neurological:     General: No focal deficit present.     Mental Status: She is alert. She is confused.     Sensory: Sensation is intact.     Motor: Weakness (Generalized, Bl) present.     Comments: Follows commands Alert to person however not place and time Neg heel to shin however difficult due to patient known stiff person syndrome Equal hand grip Intact sensation Bl No tremors Swelling to right face, unable to tell if facial droop  Psychiatric:        Mood and Affect: Mood normal.    ED Results / Procedures / Treatments   Labs (all labs ordered are listed, but only abnormal results are displayed) Labs Reviewed  CBC WITH DIFFERENTIAL/PLATELET - Abnormal; Notable for the following components:      Result Value   Hemoglobin 11.1 (*)    HCT 35.0 (*)    All other components within normal limits   COMPREHENSIVE METABOLIC PANEL - Abnormal; Notable for the following components:   Sodium 133 (*)    Glucose, Bld 554 (*)    Creatinine, Ser 1.24 (*)    AST 60 (*)    ALT 106 (*)    GFR, Estimated 51 (*)    All other components within normal limits  LIPASE, BLOOD - Abnormal; Notable for the following components:   Lipase 75 (*)    All other components within normal limits  URINALYSIS, ROUTINE W REFLEX MICROSCOPIC - Abnormal; Notable for the following components:   Color, Urine STRAW (*)    APPearance HAZY (*)    Glucose, UA >=500 (*)  Leukocytes,Ua LARGE (*)    RBC / HPF >50 (*)    WBC, UA >50 (*)    Bacteria, UA RARE (*)    All other components within normal limits  RAPID URINE DRUG SCREEN, HOSP PERFORMED - Abnormal; Notable for the following components:   Benzodiazepines POSITIVE (*)    All other components within normal limits  ACETAMINOPHEN LEVEL - Abnormal; Notable for the following components:   Acetaminophen (Tylenol), Serum <10 (*)    All other components within normal limits  SALICYLATE LEVEL - Abnormal; Notable for the following components:   Salicylate Lvl <4.0 (*)    All other components within normal limits  OSMOLALITY - Abnormal; Notable for the following components:   Osmolality 309 (*)    All other components within normal limits  CBG MONITORING, ED - Abnormal; Notable for the following components:   Glucose-Capillary 555 (*)    All other components within normal limits  CBG MONITORING, ED - Abnormal; Notable for the following components:   Glucose-Capillary 309 (*)    All other components within normal limits  RESP PANEL BY RT-PCR (FLU A&B, COVID) ARPGX2  CULTURE, BLOOD (ROUTINE X 2)  CULTURE, BLOOD (ROUTINE X 2)  URINE CULTURE  PROTIME-INR  APTT  BETA-HYDROXYBUTYRIC ACID  ETHANOL  LACTIC ACID, PLASMA  BLOOD GAS, VENOUS  LACTIC ACID, PLASMA  CK  MAGNESIUM  PHOSPHORUS  AMMONIA  LIPASE, BLOOD  TSH  VITAMIN B12  FOLATE  IRON AND TIBC   FERRITIN  RETICULOCYTES  HEMOGLOBIN A1C  HIV ANTIBODY (ROUTINE TESTING W REFLEX)  MAGNESIUM  PHOSPHORUS  CBC WITH DIFFERENTIAL/PLATELET  TSH  COMPREHENSIVE METABOLIC PANEL  I-STAT VENOUS BLOOD GAS, ED  CBG MONITORING, ED  TROPONIN I (HIGH SENSITIVITY)    EKG None  Radiology DG Lumbar Spine Complete  Result Date: 09/08/2021 CLINICAL DATA:  Fall EXAM: LUMBAR SPINE - COMPLETE 4+ VIEW COMPARISON:  CT 07/25/2021 FINDINGS: Scoliosis. Vertebral body heights are maintained. Advanced degenerative change at L5-S1 IMPRESSION: Scoliosis with degenerative change at L5-S1. No acute osseous abnormality Electronically Signed   By: Donavan Foil M.D.   On: 09/08/2021 18:08   DG Pelvis 1-2 Views  Result Date: 09/08/2021 CLINICAL DATA:  Fall EXAM: PELVIS - 1-2 VIEW COMPARISON:  CT 07/25/2021 FINDINGS: There is no evidence of pelvic fracture or diastasis. No pelvic bone lesions are seen. IMPRESSION: Negative. Electronically Signed   By: Donavan Foil M.D.   On: 09/08/2021 18:08   CT HEAD WO CONTRAST (5MM)  Result Date: 09/08/2021 CLINICAL DATA:  Fall, altered mental status EXAM: CT HEAD WITHOUT CONTRAST TECHNIQUE: Contiguous axial images were obtained from the base of the skull through the vertex without intravenous contrast. COMPARISON:  05/05/2021 FINDINGS: Brain: No acute intracranial abnormality. Specifically, no hemorrhage, hydrocephalus, mass lesion, acute infarction, or significant intracranial injury. Vascular: No hyperdense vessel or unexpected calcification. Skull: No acute calvarial abnormality. Sinuses/Orbits: No acute findings Other: None IMPRESSION: Normal study. Electronically Signed   By: Rolm Baptise M.D.   On: 09/08/2021 15:59   CT Cervical Spine Wo Contrast  Result Date: 09/08/2021 CLINICAL DATA:  Facial trauma.  Weakness. EXAM: CT CERVICAL SPINE WITHOUT CONTRAST TECHNIQUE: Multidetector CT imaging of the cervical spine was performed without intravenous contrast. Multiplanar CT image  reconstructions were also generated. COMPARISON:  None. FINDINGS: Alignment: Normal Skull base and vertebrae: No acute fracture. No primary bone lesion or focal pathologic process. Soft tissues and spinal canal: No prevertebral fluid or swelling. No visible canal hematoma. Disc levels: Degenerative disc  disease at C5-6 and C6-7 with disc space narrowing and spurring. Degenerative facet disease mainly in the lower cervical spine, left greater than right. Upper chest: No acute findings Other: None IMPRESSION: Degenerative disc and facet disease in the lower cervical spine. No acute bony abnormality. Electronically Signed   By: Rolm Baptise M.D.   On: 09/08/2021 16:02   DG Chest Portable 1 View  Result Date: 09/08/2021 CLINICAL DATA:  Altered fall EXAM: PORTABLE CHEST 1 VIEW COMPARISON:  07/25/2021, CT 07/25/2021 FINDINGS: No acute airspace disease or pleural effusion. Left retrocardiac opacity corresponding to moderate large hiatal hernia by CT. Normal cardiomediastinal silhouette. No pneumothorax. IMPRESSION: No active disease.  Hiatal hernia Electronically Signed   By: Donavan Foil M.D.   On: 09/08/2021 18:07   CT Maxillofacial Wo Contrast  Result Date: 09/08/2021 CLINICAL DATA:  Facial trauma EXAM: CT MAXILLOFACIAL WITHOUT CONTRAST TECHNIQUE: Multidetector CT imaging of the maxillofacial structures was performed. Multiplanar CT image reconstructions were also generated. COMPARISON:  None. FINDINGS: Osseous: No fracture or mandibular dislocation. No destructive process. Orbits: Negative. No traumatic or inflammatory finding. Sinuses: Clear Soft tissues: Negative Limited intracranial: See head CT report IMPRESSION: No facial or orbital fracture. Electronically Signed   By: Rolm Baptise M.D.   On: 09/08/2021 16:04   US Abdomen Limited RUQ (LIVER/GB)  Result Date: 09/08/2021 CLINICAL DATA:  Elevated liver function tests. EXAM: ULTRASOUND ABDOMEN LIMITED RIGHT UPPER QUADRANT COMPARISON:  None. FINDINGS:  Gallbladder: No gallstones or wall thickening visualized. No sonographic [name] sign noted by sonographer. Common bile duct: Diameter: 2 mm. Liver: No focal lesion identified. Within normal limits in parenchymal echogenicity. Portal vein is patent on color Doppler imaging with normal direction of blood flow towards the liver. Other: None. IMPRESSION: Unremarkable right upper quadrant ultrasound. Electronically Signed   By: Iven Finn M.D.   On: 09/08/2021 18:54    Procedures .Critical Care Performed by: Nettie Elm, PA-C Authorized by: Nettie Elm, PA-C   Critical care provider statement:    Critical care time (minutes):  35   Critical care was necessary to treat or prevent imminent or life-threatening deterioration of the following conditions:  Endocrine crisis   Critical care was time spent personally by me on the following activities:  Discussions with consultants, evaluation of patient's response to treatment, examination of patient, ordering and performing treatments and interventions, ordering and review of laboratory studies, ordering and review of radiographic studies, pulse oximetry, re-evaluation of patient's condition, obtaining history from patient or surrogate and review of old charts   Medications Ordered in ED Medications  insulin aspart (novoLOG) injection 0-9 Units (0 Units Subcutaneous Not Given 09/08/21 2028)  0.9 %  sodium chloride infusion (0 mL/hr Intravenous Hold 09/08/21 2044)  cefTRIAXone (ROCEPHIN) 1 g in sodium chloride 0.9 % 100 mL IVPB (has no administration in time range)  sodium chloride 0.9 % bolus 1,000 mL (0 mLs Intravenous Stopped 09/08/21 1954)  insulin aspart (novoLOG) injection 8 Units (8 Units Subcutaneous Given 09/08/21 1559)  Tdap (BOOSTRIX) injection 0.5 mL (0.5 mLs Intramuscular Given 09/08/21 1600)  cefTRIAXone (ROCEPHIN) 1 g in sodium chloride 0.9 % 100 mL IVPB (1 g Intravenous New Bag/Given 09/08/21 1958)  sodium chloride 0.9 % bolus  1,000 mL (1,000 mLs Intravenous New Bag/Given 09/08/21 1957)  LORazepam (ATIVAN) injection 1 mg (1 mg Intravenous Given 09/08/21 1959)    ED Course  I have reviewed the triage vital signs and the nursing notes.  Pertinent labs & imaging results that were  available during my care of the patient were reviewed by me and considered in my medical decision making (see chart for details).  Here for evaluation of altered mental status.  Unknown last normal however suspect yesterday.  She is afebrile, nonseptic appearing however does appear ill.  She is some traumatic injuries to the right side of her face, seen at urgent care per chart review 3 days ago after mechanical fall.  Pain and tingling to left plantar aspect of the foot yesterday however equal strength now with intact sensation.  Does have known history of cocaine and benzodiazepine use.  Heart lungs clear.  Abdomen soft, nontender.  No obvious of infectious process on exam.  CBG with EMS read "high."  We will initiate broad work-up, IV fluids, insulin, trauma scans and reassess  Labs and imaging personally reviewed and interpreted:  CBC without leukocytosis, hemoglobin 11.1 lactic acid 1.9 COVID, flu negative Metabolic panel hyperglycemia at 554, creatinine 1.24, similar to prior, elevated LFTs, normal alk phos, bili Lipase 75 Acetaminophen, salicylate, ethanol within normal limit CT head, max/face, cervical without acute abnormality [place] without acute abnormality DG chest neg DG pelvis neg DG lumbar neg Osmolality 309 VBG wnl UA positive for uti UDS positive for benzo Betahydroxy 0.22  Patient reassessed.  Continues to be confused however is ambulatory in room.  Osmolality is elevated, patient also has UTI.  Suspect altered mental status likely multifactorial.  She was given IV fluids, insulin for HHS.  I did discuss with patient's family from epic.  Updated on status.  CONSULT with Dr. Roel Cluck with [facility] who is agreeable to evaluate  patient for admission. Rec MR brain given patient endorsed some left foot tinging yesterday to r/o CVA not on CT  The patient appears reasonably stabilized for admission considering the current resources, flow, and capabilities available in the ED at this time, and I doubt any other [facility] requiring further screening and/or treatment in the ED prior to admission.     MDM Rules/Calculators/A&P                            Final Clinical Impression(s) / ED Diagnoses Final diagnoses:  Elevated LFTs  Altered mental status, unspecified altered mental status type  Acute cystitis without hematuria  Hyperosmolar hyperglycemic state (HHS) [facility])    Rx / DC Orders ED Discharge Orders     None        [place] Espina A, PA-C 09/08/21 2046    Godfrey Pick, MD 09/09/21 1517

## 2021-09-08 NOTE — ED Notes (Signed)
PA-C at the bedside.  ?

## 2021-09-08 NOTE — Telephone Encounter (Signed)
Attempt to return call to pt. Pt left vm stating that she was having complications and coughing. On the vm pt attempted to leave her phone number for one min with no success before hanging up. Pt was sounds very lethargic.

## 2021-09-08 NOTE — ED Notes (Signed)
Pt taken to mri

## 2021-09-08 NOTE — Patient Instructions (Addendum)
Visit Information  PATIENT GOALS:  Goals Addressed      Altered Mental Status evaluated and treated   On track    Timeframe:  Short-Term Goal Priority:  High Start Date:  09/08/21                           Expected End Date:  09/15/21  Follow Up Plan: The care management team will monitor for patient discharge at which time a CCM team member will outreach to the patient to assess for CCM needs      - patient will received emergency medical treatment by a hospital physician due to having acute changes in mental status and symptoms suggestive of hyperglycemia                        Unable to offer patient instructions due to acute changes in mental status  Follow Up Plan: The care management team will monitor for patient discharge at which time a CCM team member will outreach to the patient to assess for CCM needs      Delsa Sale, RN, BSN, CCM Care Management Coordinator [facility] Care Management/[facility]  Direct Phone: [phone]

## 2021-09-08 NOTE — H&P (Signed)
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 crackles   7. Abdomen: Soft,  non-tender, Non distended   bowel sounds present 8. Lower extremities: no clubbing, cyanosis, no  edema 9. Neurologically Grossly intact, moving all 4 extremities equally  intact 10. MSK: Normal range of motion    Chart has been reviewed  ______________________________________________________________________________________________  Assessment/Plan  55 y.o. female with medical history significant of polysubstance abuse, Hashimoto's  Admitted for acute encephalopathy  Present on Admission:  Acute encephalopathy -   - most likely multifactorial secondary to combination of  infection   mild dehydration secondary to decreased by mouth intake,  polypharmacy   -  Will rehydrate   - treat underlining infection - UTI  - Hold contributing medications Monitor for any evidence of withdrawal from Bydureon May need to restart at lower dose   MRI no evidence of CVA  - neurological exam appears to be nonfocal    - VBG unremarkable no evidence of hypercarbia    - no history of liver disease ammonia unremarkable   Hashimoto's disease -check TSH    Chronic pain syndrome -restart home medications as   Anxiety -large doses of Valium likely contributing to frequent falls.  Given the long-term use of benzodiazepines probably will need to restart but at the lower dose  will need to get follow-up as an outpatient   Anemia -obtain anemia Panel   Acute lower UTI -  - treat with Rocephin         await results of urine culture and adjust antibiotic coverage as needed   Elevated LFTs mild stable no evidence of abnormality    Elevated lipase -mild repeat in the Urine   Hypoglycemia -patient with brittle diabetes type 2.  Order sliding scale hold off on baseline insulin given hypoglycemia  DM 2  - Order Sensitive  SSI     -  check TSH and HgA1C  - Hold by mouth medications  Order diabetic coordinator consult probably will need to follow-up with endocrinology given very brittle diabetes  Other plan as per orders.  DVT prophylaxis:  SCD      Code Status:    Code Status: Prior FULL CODE   as per patient   I had personally discussed CODE STATUS with patient     Family Communication:   Family not at  Bedside    Disposition Plan:      To home once workup is complete and patient is stable   Following barriers for discharge:                            Electrolytes corrected                               Anemia stable                                                    Diabetes care coordinator                                   Nutrition    consulted                    Consults called: none  Admission status:  ED Disposition     ED Disposition  Admit    Condition  --   Comment  Hospital Area: [facility] [[id number]]  Level of Care: Telemetry [5]  Admit to tele based on following criteria: Other see comments  Comments: tia  May place patient in observation at [facility] or [facility] if equivalent level of care is available:: No  Covid Evaluation: Confirmed COVID Negative  Diagnosis: Acute encephalopathy [161096]  Admitting Physician: Therisa Doyne [[id number]]  Attending Physician: Therisa Doyne [[id number]]           Obs     Level of care  tele  For  24H     Lab Results  Component Value Date   SARSCOV2NAA NEGATIVE 09/08/2021     Precautions: admitted as   Covid Negative   PPE: Used by the provider:   N95  eye Goggles,  Gloves     Sherri [name] 09/09/2021, 2:47 AM    [facility]     after 2 AM please page floor coverage PA If 7AM-7PM, please contact the day team taking care of the patient using [email or web address]   Patient was evaluated in the context of the global COVID-19 pandemic, which necessitated consideration that the patient might  be at risk for infection with the SARS-CoV-2 virus that causes COVID-19. Institutional protocols and algorithms that pertain to the evaluation of patients at risk for COVID-19 are in a state of rapid change based on information released by regulatory bodies including the CDC and federal and state organizations. These policies and algorithms were followed during the patient's care.

## 2021-09-08 NOTE — ED Notes (Signed)
Pt to MRI

## 2021-09-08 NOTE — ED Notes (Signed)
Pt to CT at this time.

## 2021-09-08 NOTE — Progress Notes (Signed)
Chronic Care Management   CCM RN Visit Note  09/08/2021 Name: Sherri [name] MRN: [id number] DOB: [birth year]  Subjective: Sherri [name] is a 55 y.o. year old female who is a primary care patient of Arnette Felts, FNP. The care management team was consulted for assistance with disease management and care coordination needs.    Engaged with patient by telephone for follow up visit in response to provider referral for case management and/or care coordination services.   Consent to Services:  The patient was given information about Chronic Care Management services, agreed to services, and gave verbal consent prior to initiation of services.  Please see initial visit note for detailed documentation.   Patient agreed to services and verbal consent obtained.   Assessment: Review of patient past medical history, allergies, medications, health status, including review of consultants reports, laboratory and other test data, was performed as part of comprehensive evaluation and provision of chronic care management services.   SDOH (Social Determinants of Health) assessments and interventions performed:    CCM Care Plan  Allergies  Allergen Reactions   Ibuprofen Other (See Comments)    Does not take due to hx of renal insufficiency "I have kidney disease"    Lemon Flavor Swelling    Severe Lip Swelling  FRUIT per pt.     Amoxicillin Diarrhea and Other (See Comments)   Tylenol [Acetaminophen] Hives    Cannot take large quantities    Facility-Administered Encounter Medications as of 09/08/2021  Medication   0.9 %  sodium chloride infusion   [START ON 09/09/2021] cefTRIAXone (ROCEPHIN) 1 g in sodium chloride 0.9 % 100 mL IVPB   insulin aspart (novoLOG) injection 0-9 Units   Outpatient Encounter Medications as of 09/08/2021  Medication Sig Note   acetaminophen (TYLENOL) 500 MG tablet Take 1 tablet (500 mg total) by mouth every 6 (six) hours as needed. (Patient taking differently: Take 500  mg by mouth every 6 (six) hours as needed for moderate pain.)    albuterol (VENTOLIN HFA) 108 (90 Base) MCG/ACT inhaler TAKE 2 PUFFS BY MOUTH EVERY 6 HOURS AS NEEDED FOR WHEEZE OR SHORTNESS OF BREATH (Patient taking differently: Inhale 2 puffs into the lungs every 6 (six) hours as needed for wheezing or shortness of breath.)    Ascorbic Acid (VITAMIN C) 1000 MG tablet Take 1 tablet (1,000 mg total) by mouth daily.    baclofen (LIORESAL) 20 MG tablet Take 1 tablet 3 times daily    Cholecalciferol (VITAMIN D3) 10 MCG (400 UNIT) tablet Take 400 Units by mouth daily.    Continuous Blood Gluc Sensor (DEXCOM G6 SENSOR) MISC 1 Device by Does not apply route as directed.    Continuous Blood Gluc Transmit (DEXCOM G6 TRANSMITTER) MISC 1 Device by Does not apply route as directed.    Cranberry-Vitamin C-Vitamin E 4200-20-3 MG-MG-UNIT CAPS Take 1 tablet by mouth daily.    cyanocobalamin (,VITAMIN B-12,) 1000 MCG/ML injection Inject 1,000 mcg into the muscle every 3 (three) months.    dapagliflozin propanediol (FARXIGA) 5 MG TABS tablet Take 1 tablet (5 mg total) by mouth daily before breakfast.    desloratadine (CLARINEX) 5 MG tablet TAKE 1 TABLET BY MOUTH  DAILY    diazepam (VALIUM) 10 MG tablet TAKE 2 TABLETS BY MOUTH 3  TIMES DAILY as needed (Patient taking differently: Take 20 mg by mouth in the morning, at noon, and at bedtime.)    diclofenac Sodium (VOLTAREN) 1 % GEL Apply 2 g topically 4 (four) times daily. 09/08/2021:  Hasn't started   doxycycline (VIBRAMYCIN) 100 MG capsule Take 1 capsule (100 mg total) by mouth 2 (two) times daily. 09/08/2021: ABT Start Date 09/05/21. 7 Day Course   EPINEPHRINE 0.3 mg/0.3 mL IJ SOAJ injection INJECT INTRAMUSCULARLY 1  PEN AS NEEDED FOR ALLERGIC  RESPONSE AS DIRECTED BY MD. SEEK MEDICAL HELP AFTER  USE.    famotidine (PEPCID) 20 MG tablet TAKE 1 TABLET BY MOUTH TWICE A DAY    FLUoxetine (PROZAC) 10 MG capsule TAKE 1 CAPSULE BY MOUTH  DAILY (Patient taking differently: Take  10 mg by mouth at bedtime.)    gabapentin (NEURONTIN) 300 MG capsule TAKE 1 CAPSULE BY MOUTH TWO TIMES DAILY    Glucagon (GVOKE HYPOPEN 2-PACK) 0.5 MG/0.1ML SOAJ Inject 0.1 mLs into the skin as needed (if blood sugar less than 70).    glucose blood test strip Use as instructed to check sugars qac and qhs    hydrOXYzine (VISTARIL) 100 MG capsule TAKE 1 CAPSULE BY MOUTH 3  TIMES DAILY AS NEEDED FOR  ITCHING (Patient taking differently: Take 100 mg by mouth 3 (three) times daily as needed for itching.)    insulin degludec (TRESIBA) 100 UNIT/ML FlexTouch Pen Inject 13 Units into the skin daily.    Insulin Lispro-aabc (LYUMJEV KWIKPEN) 100 UNIT/ML KwikPen Use as directed (Patient taking differently: Inject 3 Units into the skin in the morning, at noon, and at bedtime. Use as directed)    Insulin Pen Needle 32G X 4 MM MISC 1 each by Does not apply route 2 (two) times daily. Dx E11.9    vitamin E 180 MG (400 UNITS) capsule Take 400 Units by mouth daily.      Patient Active Problem List   Diagnosis Date Noted   Acute encephalopathy 09/08/2021   Acute lower UTI 09/08/2021   Elevated LFTs 09/08/2021   Elevated lipase 09/08/2021   Hashimoto's disease 08/09/2021   Type 2 diabetes mellitus with hyperglycemia, with long-term current use of insulin (HCC) 08/09/2021   Type 2 diabetes mellitus with stage 3a chronic kidney disease, with long-term current use of insulin (HCC) 08/09/2021   Type 2 diabetes mellitus with diabetic polyneuropathy, with long-term current use of insulin (HCC) 08/09/2021   Hypoglycemia 05/05/2021   AKI (acute kidney injury) (HCC) 05/05/2021   Anxiety    Chronic rhinitis 04/24/2019   Keloid 10/10/2018   Diabetes type 2, controlled (HCC) 10/10/2018   Depression 01/02/2017   History of DVT (deep vein thrombosis) 01/02/2017   Chronic pain syndrome 01/02/2017   Cocaine abuse (HCC) 01/02/2017   Dysphagia    Adjustment insomnia    Distal radius fracture, right 08/16/2016    Postconcussion syndrome 12/30/2014   Tremor 03/11/2014   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   Hiatal hernia 03/21/2013   Anemia, iron deficiency 01/29/2013   Spasm of muscle 09/02/2012   Anemia 08/28/2012   Mild intermittent asthma 08/27/2012   Chronic back pain    Pernicious anemia 10/30/2011   DEGENERATIVE DISC DISEASE 08/18/2010   Asthma with bronchitis 07/28/2010   Recurrent urticaria 07/28/2010    Conditions to be addressed/monitored:DMII, Stiff-Person Syndrome, Asthma with bronchitis, GERD, Depression, Hashimoto's disease  Care Plan : Acute change in Mental status  Updates made by [name], RN since 09/08/2021 12:00 AM     Problem: Acute change in mental status   Priority: High     Goal: Altered Mental Status evaluated and treated   Start Date: 09/08/2021  Expected End Date: 09/15/2021  This Visit's Progress: On  track  Priority: High  Note:   Current Barriers:  Ineffective Self Health Maintenance in a patient with DMII, Stiff-Person Syndrome, Asthma with bronchitis, GERD, Depression, Hashimoto's disease Knowledge deficit related to Self Health Management of Diabetes Mellitus Acute change in Cognitive Status  Does not take medications as prescribed Does not refill medications when supply is low Financial Restraints Clinical Goal(s):  Collaboration with Arnette Felts, FNP regarding development and update of comprehensive plan of care as evidenced by provider attestation and co-signature Inter-disciplinary care team collaboration (see longitudinal plan of care) patient will work with care management team to address care coordination and chronic disease management needs related to Disease Management Educational Needs Care Coordination Medication Management and Education Psychosocial Support   Interventions:  Collaboration with Arnette Felts, FNP regarding development and update of comprehensive plan of care as evidenced by provider attestation       and  co-signature Inter-disciplinary care team collaboration (see longitudinal plan of care) Received inbound call from patient stating she is not feeling well, patient is noted to have slurred speech and difficulty with thought and word processing and inability to articulate her words correctly and clearly   Determined patient states she has been sleeping most of the day and feels it is difficult for her to stay awake Discussed patient believes she took [place] 13 units this am and 3 units of her fast acting insulin with her breakfast, states she has not eaten since due to going back to sleep after breakfast, she believes she checked her blood sugar but cannot recall her reading Instructed patient to check her blood sugar if able during the call, patient followed this instruction with difficulty but is able to complete the task, reports the meter is reading "HI" Determined patient previously contacted her sister Nellie who should be arriving to her home anytime Determined patients symptoms warrant emergency medical treatment, informed patient medical attention is needed promptly  Collaborated with embedded BSW [name] via secure chat while keeping the patient on the line, requested she call 911 to request EMS be dispatched to patient's home Advised patient a CCM team member has called 911 to dispatch EMS to her home, she confirmed her front door is unlocked Stayed on the line with patient until her sister "Nellie" arrived, spoke with Nellie to advise EMS is in route Confirmed sister Freddy Jaksch will end the call in order to monitor her sisters status and collect her medications, she will not leave her side until EMS arrives to transport her to the hospital Received a voice message from sister Nellie stating the patient is being transported via EMS to [facility]  Sent in basket message to PCP provider Arnette Felts, FNP and embedded BSW [name] with a patient status update  Patient Goals:   - patient will received emergency medical treatment by a hospital physician due to having acute changes in mental status and symptoms suggestive of hyperglycemia       Follow Up Plan: The care management team will monitor for patient discharge at which time a CCM team member will outreach to the patient to assess for CCM needs     The care management team will monitor for patient discharge at which time a CCM team member will outreach to the patient to assess for CCM needs  Delsa Sale, RN, BSN, CCM Care Management Coordinator [facility] Care Management/[facility]  Direct Phone: [phone]

## 2021-09-09 ENCOUNTER — Ambulatory Visit: Payer: Self-pay

## 2021-09-09 ENCOUNTER — Other Ambulatory Visit: Payer: Self-pay

## 2021-09-09 DIAGNOSIS — R748 Abnormal levels of other serum enzymes: Secondary | ICD-10-CM | POA: Diagnosis present

## 2021-09-09 DIAGNOSIS — R410 Disorientation, unspecified: Secondary | ICD-10-CM

## 2021-09-09 DIAGNOSIS — E063 Autoimmune thyroiditis: Secondary | ICD-10-CM | POA: Diagnosis present

## 2021-09-09 DIAGNOSIS — E1165 Type 2 diabetes mellitus with hyperglycemia: Secondary | ICD-10-CM

## 2021-09-09 DIAGNOSIS — R4182 Altered mental status, unspecified: Secondary | ICD-10-CM | POA: Diagnosis present

## 2021-09-09 DIAGNOSIS — G9340 Encephalopathy, unspecified: Secondary | ICD-10-CM | POA: Diagnosis not present

## 2021-09-09 DIAGNOSIS — M1990 Unspecified osteoarthritis, unspecified site: Secondary | ICD-10-CM | POA: Diagnosis present

## 2021-09-09 DIAGNOSIS — E1065 Type 1 diabetes mellitus with hyperglycemia: Secondary | ICD-10-CM | POA: Diagnosis present

## 2021-09-09 DIAGNOSIS — F32A Depression, unspecified: Secondary | ICD-10-CM

## 2021-09-09 DIAGNOSIS — F419 Anxiety disorder, unspecified: Secondary | ICD-10-CM | POA: Diagnosis present

## 2021-09-09 DIAGNOSIS — G894 Chronic pain syndrome: Secondary | ICD-10-CM | POA: Diagnosis present

## 2021-09-09 DIAGNOSIS — Z20822 Contact with and (suspected) exposure to covid-19: Secondary | ICD-10-CM | POA: Diagnosis present

## 2021-09-09 DIAGNOSIS — E1022 Type 1 diabetes mellitus with diabetic chronic kidney disease: Secondary | ICD-10-CM | POA: Diagnosis present

## 2021-09-09 DIAGNOSIS — J45909 Unspecified asthma, uncomplicated: Secondary | ICD-10-CM | POA: Diagnosis present

## 2021-09-09 DIAGNOSIS — E1021 Type 1 diabetes mellitus with diabetic nephropathy: Secondary | ICD-10-CM

## 2021-09-09 DIAGNOSIS — Z28310 Unvaccinated for covid-19: Secondary | ICD-10-CM | POA: Diagnosis not present

## 2021-09-09 DIAGNOSIS — N1831 Chronic kidney disease, stage 3a: Secondary | ICD-10-CM | POA: Diagnosis present

## 2021-09-09 DIAGNOSIS — F1410 Cocaine abuse, uncomplicated: Secondary | ICD-10-CM | POA: Diagnosis present

## 2021-09-09 DIAGNOSIS — G2582 Stiff-man syndrome: Secondary | ICD-10-CM | POA: Diagnosis present

## 2021-09-09 DIAGNOSIS — R7989 Other specified abnormal findings of blood chemistry: Secondary | ICD-10-CM | POA: Diagnosis not present

## 2021-09-09 DIAGNOSIS — Z23 Encounter for immunization: Secondary | ICD-10-CM | POA: Diagnosis not present

## 2021-09-09 DIAGNOSIS — G9341 Metabolic encephalopathy: Secondary | ICD-10-CM | POA: Diagnosis present

## 2021-09-09 DIAGNOSIS — E860 Dehydration: Secondary | ICD-10-CM | POA: Diagnosis present

## 2021-09-09 DIAGNOSIS — E440 Moderate protein-calorie malnutrition: Secondary | ICD-10-CM | POA: Insufficient documentation

## 2021-09-09 DIAGNOSIS — K219 Gastro-esophageal reflux disease without esophagitis: Secondary | ICD-10-CM

## 2021-09-09 DIAGNOSIS — A749 Chlamydial infection, unspecified: Secondary | ICD-10-CM | POA: Diagnosis present

## 2021-09-09 DIAGNOSIS — E1051 Type 1 diabetes mellitus with diabetic peripheral angiopathy without gangrene: Secondary | ICD-10-CM | POA: Diagnosis present

## 2021-09-09 DIAGNOSIS — Z681 Body mass index (BMI) 19 or less, adult: Secondary | ICD-10-CM | POA: Diagnosis not present

## 2021-09-09 DIAGNOSIS — D631 Anemia in chronic kidney disease: Secondary | ICD-10-CM | POA: Diagnosis present

## 2021-09-09 LAB — PHOSPHORUS
Phosphorus: 3.1 mg/dL (ref 2.5–4.6)
Phosphorus: 3.2 mg/dL (ref 2.5–4.6)

## 2021-09-09 LAB — COMPREHENSIVE METABOLIC PANEL
ALT: 89 U/L — ABNORMAL HIGH (ref 0–44)
AST: 49 U/L — ABNORMAL HIGH (ref 15–41)
Albumin: 3.4 g/dL — ABNORMAL LOW (ref 3.5–5.0)
Alkaline Phosphatase: 97 U/L (ref 38–126)
Anion gap: 6 (ref 5–15)
BUN: 15 mg/dL (ref 6–20)
CO2: 25 mmol/L (ref 22–32)
Calcium: 9.3 mg/dL (ref 8.9–10.3)
Chloride: 109 mmol/L (ref 98–111)
Creatinine, Ser: 1.05 mg/dL — ABNORMAL HIGH (ref 0.44–1.00)
GFR, Estimated: >60 mL/min (ref >60)
Glucose, Bld: 142 mg/dL — ABNORMAL HIGH (ref 70–99)
Potassium: 4.4 mmol/L (ref 3.5–5.1)
Sodium: 140 mmol/L (ref 135–145)
Total Bilirubin: 0.3 mg/dL (ref 0.3–1.2)
Total Protein: 6.5 g/dL (ref 6.5–8.1)

## 2021-09-09 LAB — CBC WITH DIFFERENTIAL/PLATELET
Abs Immature Granulocytes: 0.08 10*3/uL — ABNORMAL HIGH (ref 0.00–0.07)
Basophils Absolute: 0 10*3/uL (ref 0.0–0.1)
Basophils Relative: 0 %
Eosinophils Absolute: 0 10*3/uL (ref 0.0–0.5)
Eosinophils Relative: 0 %
HCT: 31.7 % — ABNORMAL LOW (ref 36.0–46.0)
Hemoglobin: 10.3 g/dL — ABNORMAL LOW (ref 12.0–15.0)
Immature Granulocytes: 1 %
Lymphocytes Relative: 23 %
Lymphs Abs: 2.2 10*3/uL (ref 0.7–4.0)
MCH: 27.7 pg (ref 26.0–34.0)
MCHC: 32.5 g/dL (ref 30.0–36.0)
MCV: 85.2 fL (ref 80.0–100.0)
Monocytes Absolute: 0.6 10*3/uL (ref 0.1–1.0)
Monocytes Relative: 6 %
Neutro Abs: 6.8 10*3/uL (ref 1.7–7.7)
Neutrophils Relative %: 70 %
Platelets: 221 10*3/uL (ref 150–400)
RBC: 3.72 MIL/uL — ABNORMAL LOW (ref 3.87–5.11)
RDW: 11.8 % (ref 11.5–15.5)
WBC: 9.6 10*3/uL (ref 4.0–10.5)
nRBC: 0 % (ref 0.0–0.2)

## 2021-09-09 LAB — RETICULOCYTES
Immature Retic Fract: 8.5 % (ref 2.3–15.9)
RBC.: 3.68 MIL/uL — ABNORMAL LOW (ref 3.87–5.11)
Retic Count, Absolute: 58.5 10*3/uL (ref 19.0–186.0)
Retic Ct Pct: 1.6 % (ref 0.4–3.1)

## 2021-09-09 LAB — TSH: TSH: 0.555 u[IU]/mL (ref 0.350–4.500)

## 2021-09-09 LAB — LACTIC ACID, PLASMA: Lactic Acid, Venous: 2.8 mmol/L (ref 0.5–1.9)

## 2021-09-09 LAB — IRON AND TIBC
Iron: 40 ug/dL (ref 28–170)
Saturation Ratios: 16 % (ref 10.4–31.8)
TIBC: 253 ug/dL (ref 250–450)
UIBC: 213 ug/dL

## 2021-09-09 LAB — HEMOGLOBIN A1C
Hgb A1c MFr Bld: 12.5 % — ABNORMAL HIGH (ref 4.8–5.6)
Mean Plasma Glucose: 312.05 mg/dL

## 2021-09-09 LAB — FOLATE: Folate: 16.7 ng/mL (ref >5.9)

## 2021-09-09 LAB — MAGNESIUM
Magnesium: 1.9 mg/dL (ref 1.7–2.4)
Magnesium: 2.4 mg/dL (ref 1.7–2.4)

## 2021-09-09 LAB — LIPASE, BLOOD: Lipase: 60 U/L — ABNORMAL HIGH (ref 11–51)

## 2021-09-09 LAB — GLUCOSE, CAPILLARY
Glucose-Capillary: 137 mg/dL — ABNORMAL HIGH (ref 70–99)
Glucose-Capillary: 144 mg/dL — ABNORMAL HIGH (ref 70–99)
Glucose-Capillary: 163 mg/dL — ABNORMAL HIGH (ref 70–99)
Glucose-Capillary: 240 mg/dL — ABNORMAL HIGH (ref 70–99)
Glucose-Capillary: 334 mg/dL — ABNORMAL HIGH (ref 70–99)
Glucose-Capillary: 69 mg/dL — ABNORMAL LOW (ref 70–99)
Glucose-Capillary: 75 mg/dL (ref 70–99)
Glucose-Capillary: 93 mg/dL (ref 70–99)

## 2021-09-09 LAB — CK: Total CK: 96 U/L (ref 38–234)

## 2021-09-09 LAB — VITAMIN B12: Vitamin B-12: 556 pg/mL (ref 180–914)

## 2021-09-09 LAB — TROPONIN I (HIGH SENSITIVITY): Troponin I (High Sensitivity): 2 ng/L (ref <18)

## 2021-09-09 LAB — FERRITIN: Ferritin: 623 ng/mL — ABNORMAL HIGH (ref 11–307)

## 2021-09-09 LAB — AMMONIA: Ammonia: 39 umol/L — ABNORMAL HIGH (ref 9–35)

## 2021-09-09 LAB — HIV ANTIBODY (ROUTINE TESTING W REFLEX): HIV Screen 4th Generation wRfx: NONREACTIVE

## 2021-09-09 MED ORDER — GABAPENTIN 300 MG PO CAPS
300.0000 mg | ORAL_CAPSULE | Freq: Two times a day (BID) | ORAL | Status: DC
  Administered 2021-09-09 – 2021-09-11 (×5): 300 mg via ORAL
  Filled 2021-09-09 (×5): qty 1

## 2021-09-09 MED ORDER — SODIUM CHLORIDE 0.9 % IV BOLUS
500.0000 mL | Freq: Once | INTRAVENOUS | Status: AC
  Administered 2021-09-09: 500 mL via INTRAVENOUS

## 2021-09-09 MED ORDER — SODIUM CHLORIDE 0.9 % IV SOLN: INTRAVENOUS | Status: DC

## 2021-09-09 MED ORDER — INSULIN ASPART 100 UNIT/ML IJ SOLN
0.0000 [IU] | Freq: Three times a day (TID) | INTRAMUSCULAR | Status: DC
  Administered 2021-09-09: 7 [IU] via SUBCUTANEOUS
  Administered 2021-09-09 (×2): 1 [IU] via SUBCUTANEOUS
  Administered 2021-09-10: 9 [IU] via SUBCUTANEOUS
  Administered 2021-09-10: 3 [IU] via SUBCUTANEOUS
  Administered 2021-09-10: 2 [IU] via SUBCUTANEOUS

## 2021-09-09 MED ORDER — ADULT MULTIVITAMIN W/MINERALS CH
1.0000 | ORAL_TABLET | Freq: Every day | ORAL | Status: DC
  Administered 2021-09-09 – 2021-09-11 (×3): 1 via ORAL
  Filled 2021-09-09 (×3): qty 1

## 2021-09-09 MED ORDER — GLUCERNA SHAKE PO LIQD
237.0000 mL | Freq: Three times a day (TID) | ORAL | Status: DC
  Administered 2021-09-09 – 2021-09-11 (×6): 237 mL via ORAL
  Filled 2021-09-09 (×8): qty 237

## 2021-09-09 MED ORDER — BACLOFEN 20 MG PO TABS
20.0000 mg | ORAL_TABLET | Freq: Three times a day (TID) | ORAL | Status: DC
  Administered 2021-09-09 – 2021-09-11 (×7): 20 mg via ORAL
  Filled 2021-09-09 (×7): qty 1

## 2021-09-09 MED ORDER — LIP MEDEX EX OINT
TOPICAL_OINTMENT | CUTANEOUS | Status: DC | PRN
  Filled 2021-09-09: qty 7

## 2021-09-09 MED ORDER — DIAZEPAM 5 MG PO TABS
20.0000 mg | ORAL_TABLET | Freq: Three times a day (TID) | ORAL | Status: DC
  Administered 2021-09-09 – 2021-09-11 (×6): 20 mg via ORAL
  Filled 2021-09-09 (×6): qty 4

## 2021-09-09 MED ORDER — DIAZEPAM 5 MG PO TABS
20.0000 mg | ORAL_TABLET | Freq: Three times a day (TID) | ORAL | Status: DC | PRN
  Administered 2021-09-09: 20 mg via ORAL
  Filled 2021-09-09: qty 4

## 2021-09-09 MED ORDER — INSULIN GLARGINE-YFGN 100 UNIT/ML ~~LOC~~ SOLN
10.0000 [IU] | Freq: Every day | SUBCUTANEOUS | Status: DC
  Administered 2021-09-09 – 2021-09-10 (×2): 10 [IU] via SUBCUTANEOUS
  Filled 2021-09-09 (×2): qty 0.1

## 2021-09-09 MED ORDER — FLUOXETINE HCL 10 MG PO CAPS
10.0000 mg | ORAL_CAPSULE | Freq: Every day | ORAL | Status: DC
  Administered 2021-09-09 – 2021-09-10 (×2): 10 mg via ORAL
  Filled 2021-09-09 (×2): qty 1

## 2021-09-09 MED ORDER — FAMOTIDINE 20 MG PO TABS
20.0000 mg | ORAL_TABLET | Freq: Two times a day (BID) | ORAL | Status: DC
  Administered 2021-09-09 – 2021-09-11 (×5): 20 mg via ORAL
  Filled 2021-09-09 (×5): qty 1
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
## 2021-09-09 NOTE — Chronic Care Management (AMB) (Addendum)
Chronic Care Management   CCM RN Visit Note  09/09/2021 Name: Sherri [name] MRN: [id number] DOB: 02-Jul-1966  Subjective: Sherri [name] is a 55 y.o. year old female who is a primary care patient of Arnette Felts, FNP. The care management team was consulted for assistance with disease management and care coordination needs.    Collaboration with hospital liaison Christophe Louis RN  for  Case Collaboration   in response to provider referral for case management and/or care coordination services.   Consent to Services:  The patient was given information about Chronic Care Management services, agreed to services, and gave verbal consent prior to initiation of services.  Please see initial visit note for detailed documentation.   Patient agreed to services and verbal consent obtained.   Assessment: Review of patient past medical history, allergies, medications, health status, including review of consultants reports, laboratory and other test data, was performed as part of comprehensive evaluation and provision of chronic care management services.   SDOH (Social Determinants of Health) assessments and interventions performed:    CCM Care Plan  Allergies  Allergen Reactions   Ibuprofen Other (See Comments)    Does not take due to hx of renal insufficiency "I have kidney disease"    Lemon Flavor Swelling    Severe Lip Swelling  FRUIT per pt.     Amoxicillin Diarrhea and Other (See Comments)   Tylenol [Acetaminophen] Hives    Cannot take large quantities    Facility-Administered Encounter Medications as of 09/09/2021  Medication   0.9 %  sodium chloride infusion   cefTRIAXone (ROCEPHIN) 1 g in sodium chloride 0.9 % 100 mL IVPB   insulin aspart (novoLOG) injection 0-9 Units   lip balm (CARMEX) ointment   Outpatient Encounter Medications as of 09/09/2021  Medication Sig Note   acetaminophen (TYLENOL) 500 MG tablet Take 1 tablet (500 mg total) by mouth every 6 (six) hours as needed.  (Patient taking differently: Take 500 mg by mouth every 6 (six) hours as needed for moderate pain.)    albuterol (VENTOLIN HFA) 108 (90 Base) MCG/ACT inhaler TAKE 2 PUFFS BY MOUTH EVERY 6 HOURS AS NEEDED FOR WHEEZE OR SHORTNESS OF BREATH (Patient taking differently: Inhale 2 puffs into the lungs every 6 (six) hours as needed for wheezing or shortness of breath.)    Ascorbic Acid (VITAMIN C) 1000 MG tablet Take 1 tablet (1,000 mg total) by mouth daily.    baclofen (LIORESAL) 20 MG tablet Take 1 tablet 3 times daily    Cholecalciferol (VITAMIN D3) 10 MCG (400 UNIT) tablet Take 400 Units by mouth daily.    Continuous Blood Gluc Sensor (DEXCOM G6 SENSOR) MISC 1 Device by Does not apply route as directed.    Continuous Blood Gluc Transmit (DEXCOM G6 TRANSMITTER) MISC 1 Device by Does not apply route as directed.    Cranberry-Vitamin C-Vitamin E 4200-20-3 MG-MG-UNIT CAPS Take 1 tablet by mouth daily.    cyanocobalamin (,VITAMIN B-12,) 1000 MCG/ML injection Inject 1,000 mcg into the muscle every 3 (three) months.    dapagliflozin propanediol (FARXIGA) 5 MG TABS tablet Take 1 tablet (5 mg total) by mouth daily before breakfast.    desloratadine (CLARINEX) 5 MG tablet TAKE 1 TABLET BY MOUTH  DAILY    diazepam (VALIUM) 10 MG tablet TAKE 2 TABLETS BY MOUTH 3  TIMES DAILY as needed (Patient taking differently: Take 20 mg by mouth in the morning, at noon, and at bedtime.)    diclofenac Sodium (VOLTAREN) 1 % GEL Apply  2 g topically 4 (four) times daily. 09/08/2021: Hasn't started   doxycycline (VIBRAMYCIN) 100 MG capsule Take 1 capsule (100 mg total) by mouth 2 (two) times daily. 09/08/2021: ABT Start Date 09/05/21. 7 Day Course   EPINEPHRINE 0.3 mg/0.3 mL IJ SOAJ injection INJECT INTRAMUSCULARLY 1  PEN AS NEEDED FOR ALLERGIC  RESPONSE AS DIRECTED BY MD. SEEK MEDICAL HELP AFTER  USE.    famotidine (PEPCID) 20 MG tablet TAKE 1 TABLET BY MOUTH TWICE A DAY    FLUoxetine (PROZAC) 10 MG capsule TAKE 1 CAPSULE BY MOUTH   DAILY (Patient taking differently: Take 10 mg by mouth at bedtime.)    gabapentin (NEURONTIN) 300 MG capsule TAKE 1 CAPSULE BY MOUTH TWO TIMES DAILY    Glucagon (GVOKE HYPOPEN 2-PACK) 0.5 MG/0.1ML SOAJ Inject 0.1 mLs into the skin as needed (if blood sugar less than 70).    glucose blood test strip Use as instructed to check sugars qac and qhs    hydrOXYzine (VISTARIL) 100 MG capsule TAKE 1 CAPSULE BY MOUTH 3  TIMES DAILY AS NEEDED FOR  ITCHING (Patient taking differently: Take 100 mg by mouth 3 (three) times daily as needed for itching.)    insulin degludec (TRESIBA) 100 UNIT/ML FlexTouch Pen Inject 13 Units into the skin daily.    Insulin Lispro-aabc (LYUMJEV KWIKPEN) 100 UNIT/ML KwikPen Use as directed (Patient taking differently: Inject 3 Units into the skin in the morning, at noon, and at bedtime. Use as directed)    Insulin Pen Needle 32G X 4 MM MISC 1 each by Does not apply route 2 (two) times daily. Dx E11.9    vitamin E 180 MG (400 UNITS) capsule Take 400 Units by mouth daily.      Patient Active Problem List   Diagnosis Date Noted   Acute encephalopathy 09/08/2021   Acute lower UTI 09/08/2021   Elevated LFTs 09/08/2021   Elevated lipase 09/08/2021   Hashimoto's disease 08/09/2021   Type 2 diabetes mellitus with hyperglycemia, with long-term current use of insulin (HCC) 08/09/2021   Type 2 diabetes mellitus with stage 3a chronic kidney disease, with long-term current use of insulin (HCC) 08/09/2021   Type 2 diabetes mellitus with diabetic polyneuropathy, with long-term current use of insulin (HCC) 08/09/2021   Hypoglycemia 05/05/2021   AKI (acute kidney injury) (HCC) 05/05/2021   Anxiety    Chronic rhinitis 04/24/2019   Keloid 10/10/2018   Diabetes type 2, controlled (HCC) 10/10/2018   Depression 01/02/2017   History of DVT (deep vein thrombosis) 01/02/2017   Chronic pain syndrome 01/02/2017   Cocaine abuse (HCC) 01/02/2017   Dysphagia    Adjustment insomnia    Distal radius  fracture, right 08/16/2016   Postconcussion syndrome 12/30/2014   Tremor 03/11/2014   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   Hiatal hernia 03/21/2013   Anemia, iron deficiency 01/29/2013   Spasm of muscle 09/02/2012   Anemia 08/28/2012   Mild intermittent asthma 08/27/2012   Chronic back pain    Pernicious anemia 10/30/2011   DEGENERATIVE DISC DISEASE 08/18/2010   Asthma with bronchitis 07/28/2010   Recurrent urticaria 07/28/2010    Conditions to be addressed/monitored:DMI, Stiff-Person Syndrome, Asthma with bronchitis, GERD, Depression, Hashimoto's disease  Care Plan : Acute change in Mental status  Updates made by [name], RN since 09/09/2021 12:00 AM     Problem: Acute change in mental status   Priority: High     Goal: Altered Mental Status evaluated and treated   Start Date: 09/08/2021  Expected  End Date: 09/15/2021  Recent Progress: On track  Priority: High  Note:   Current Barriers:  Ineffective Self Health Maintenance in a patient with DMII, Stiff-Person Syndrome, Asthma with bronchitis, GERD, Depression, Hashimoto's disease Knowledge deficit related to Self Health Management of Diabetes Mellitus Acute change in Cognitive Status  Does not take medications as prescribed Does not refill medications when supply is low Financial Restraints Clinical Goal(s):  Collaboration with Arnette Felts, FNP regarding development and update of comprehensive plan of care as evidenced by provider attestation and co-signature Inter-disciplinary care team collaboration (see longitudinal plan of care) patient will work with care management team to address care coordination and chronic disease management needs related to Disease Management Educational Needs Care Coordination Medication Management and Education Psychosocial Support   Interventions:  09/08/21 Inbound call from patient  Collaboration with Arnette Felts, FNP regarding development and update of comprehensive plan of care  as evidenced by provider attestation       and co-signature Inter-disciplinary care team collaboration (see longitudinal plan of care) Received inbound call from patient stating she is not feeling well, patient is noted to have slurred speech and difficulty with thought and word processing and inability to articulate her words correctly and clearly   Determined patient states she has been sleeping most of the day and feels it is difficult for her to stay awake Discussed patient believes she took [place] 13 units this am and 3 units of her fast acting insulin with her breakfast, states she has not eaten since due to going back to sleep after breakfast, she believes she checked her blood sugar but cannot recall her reading Instructed patient to check her blood sugar if able during the call, patient followed this instruction with difficulty but is able to complete the task, reports the meter is reading "HI" Determined patient previously contacted her sister Nellie who should be arriving to her home anytime Determined patients symptoms warrant emergency medical treatment, informed patient medical attention is needed promptly  Collaborated with embedded BSW [name] via secure chat while keeping the patient on the line, requested she call 911 to request EMS be dispatched to patient's home Advised patient a CCM team member has called 911 to dispatch EMS to her home, she confirmed her front door is unlocked Stayed on the line with patient until her sister "Nellie" arrived, spoke with Nellie to advise EMS is in route Confirmed sister Freddy Jaksch will end the call in order to monitor her sisters status and collect her medications, she will not leave her side until EMS arrives to transport her to the hospital Received a voice message from sister Nellie stating the patient is being transported via EMS to [facility]  Sent in basket message to PCP provider Arnette Felts, FNP and embedded BSW [name]  with a patient status update  09/09/21 Case Collaboration  Collaboration with hospital liaison Christophe Louis RN via in basket message, requesting she communicate to the treating physician, patient's type 2 DM was recently changed to type 1 DM by Endocrinologist, Dr. Lonzo Cloud on 08/08/21 after patients Islet Cell Antibody Screen came back positive. Advised her DM medications were changed by Dr. Lonzo Cloud according to type 1 DM diagnosis and patient was made aware.  Further collaboration was sent via secure chat to Loman Brooklyn, RN, [facility] Diabetes Coordinator advising of patient's new patient establishment with Dr. Lonzo Cloud, advised of her recent diagnosis change per Dr. Lonzo Cloud regarding DM2 changed to DMI after having a positive  Islet Cell Antibody Further collaborated with hospital liaison Christophe Louis RN via secure chat to include the recent medications changes per Dr. Lonzo Cloud from 08/08/21 encounter with patient as well as the Lymjev insulin sliding scale provided on 09/06/21 after patient experienced a hyperglycemic event with cbg at 549 on home meter, advised patient was symptomatic and experienced a fall the night before, advised Dr. Lonzo Cloud was made aware and her office staff contacted the patient to advise on insulin dosing at which time the sliding scale was also provided, noted the Lymjev insulin is a new insulin for Ms. Haisley as she has never used in the past  Patient Goals:  - patient will received emergency medical treatment by a hospital physician due to having acute changes in mental status and symptoms suggestive of hyperglycemia       Follow Up Plan: The care management team will monitor for patient discharge at which time a CCM team member will outreach to the patient to assess for CCM needs     Follow Up Plan: The care management team will monitor for patient discharge at which time a CCM team member will outreach to the patient to assess for CCM needs  Delsa Sale, RN, BSN, CCM Care Management Coordinator [facility]  Direct Phone: [phone]

## 2021-09-09 NOTE — Evaluation (Addendum)
Physical Therapy Evaluation Patient Details Name: Sherri [name] MRN: [id number] DOB: July 16, 1966 Today's Date: 09/09/2021  History of Present Illness  55 yo admitted 09/08/21 with AMS. PMH: DM, stiff person syndrome,anemia, poly substance abuse depression, chronic pain, patient had recent fall and trauma to face and right hand sprain.  Clinical Impression  The patient had ambulated with RN to toilet. Upon standing up with mod assistance at [facility], patient experienced total body spasms, Right leg extended back, unable to break spasm to sit down, total body shaking also. Called for  recliner to be brought closer. Spasms  decreased enough to turn to sit down in recliner. Patient assisted with 2  HHa to stand  and pivot to bed, noted spasms starting again.  Patient lives alone still drives and ambulates with a cane at baseline. Pt admitted with above diagnosis.  Pt currently with functional limitations due to the deficits listed below (see PT Problem List). Pt will benefit from skilled PT to increase their independence and safety with mobility to allow discharge to the venue listed below.   Patient frequently requesting medication for spasms.      Recommendations for follow up therapy are one component of a multi-disciplinary discharge planning process, led by the attending physician.  Recommendations may be updated based on patient status, additional functional criteria and insurance authorization.  Follow Up Recommendations SNFunless improves to baseline    Equipment Recommendations  None recommended by PT    Recommendations for Other Services       Precautions / Restrictions Precautions Precautions: Fall Precaution Comments: has severe body spasms, stiffens up and cannot move.      Mobility  Bed Mobility Overal bed mobility: Needs Assistance Bed Mobility: Sit to Supine       Sit to supine: Mod assist   General bed mobility comments: assist with legs due to spasm onset.     Transfers Overall transfer level: Needs assistance Equipment used: Rolling walker (2 wheeled);2 person hand held assist Transfers: Sit to/from [employer] Sit to Stand: Max assist;+2 safety/equipment;+2 physical assistance Stand pivot transfers: Max assist;+2 physical assistance;+2 safety/equipment       General transfer comment: upon standing from toilet,entire body went into spasms,  and shaking. Lasted  a minute, unable to break spasm to sit down. called for recliner to be brought up. Spasms did reside enough to place intoo recliner. Stood again from recliner to pivot to bed with 2 mod assist, noted spasms ramping up but not as bad.  Ambulation/Gait                Stairs            Wheelchair Mobility    Modified Rankin (Stroke Patients Only)       Balance Overall balance assessment: Independent;History of Falls;Needs assistance Sitting-balance support: Feet supported;Bilateral upper extremity supported Sitting balance-Leahy Scale: Poor Sitting balance - Comments: due to spasm onset   Standing balance support: Bilateral upper extremity supported;During functional activity Standing balance-Leahy Scale: Zero Standing balance comment: body spasms, unable to stand  without max/total  support                             Pertinent Vitals/Pain Pain Assessment: Faces Faces Pain Scale: Hurts little more Pain Location: having BM, back  when spasms occurred Pain Descriptors / Indicators: Discomfort Pain Intervention(s): Monitored during session;Patient requesting pain meds-RN notified (requesting  spasm meds)    Home Living Family/patient  expects to be discharged to:: Private residence Living Arrangements: Alone Available Help at Discharge: Family;Available PRN/intermittently Type of Home: Apartment Home Access: Level entry     Home Layout: One level Home Equipment: Walker - 2 wheels;Cane - single point      Prior Function Level  of Independence: Independent with assistive device(s)         Comments: ues cane, drives     Hand Dominance   Dominant Hand: Right    Extremity/Trunk Assessment        Lower Extremity Assessment Lower Extremity Assessment: RLE deficits/detail;LLE deficits/detail RLE Deficits / Details: able to bear weight, when body in spasms, right leg flexed at knee, got behind toilet, legs stiffened LLE Deficits / Details: dspasms when standing    Cervical / Trunk Assessment Cervical / Trunk Assessment: Other exceptions Cervical / Trunk Exceptions: severe spasms when standing from toilet, trunk extension, difficulty in getting trunk to flex to sit down until spasms decreased  Communication   Communication: No difficulties  Cognition Arousal/Alertness: Awake/alert Behavior During Therapy: WFL for tasks assessed/performed Overall Cognitive Status: Within Functional Limits for tasks assessed                                        General Comments      Exercises     Assessment/Plan    PT Assessment Patient needs continued PT services  PT Problem List Decreased strength;Impaired tone;Decreased activity tolerance;Decreased knowledge of use of DME;Decreased balance;Decreased cognition;Decreased knowledge of precautions;Decreased coordination       PT Treatment Interventions DME instruction;Therapeutic activities;Gait training;Therapeutic exercise;Patient/family education;Functional mobility training    PT Goals (Current goals can be found in the Care Plan section)  Acute Rehab PT Goals Patient Stated Goal: to get my medicine for my spasms PT Goal Formulation: With patient Time For Goal Achievement: 09/23/21 Potential to Achieve Goals: Fair    Frequency Min 2X/week   Barriers to discharge Decreased caregiver support      Co-evaluation               AM-PAC PT "6 Clicks" Mobility  Outcome Measure Help needed turning from your back to your side while in  a flat bed without using bedrails?: A Lot Help needed moving from lying on your back to sitting on the side of a flat bed without using bedrails?: A Lot Help needed moving to and from a bed to a chair (including a wheelchair)?: A Lot Help needed standing up from a chair using your arms (e.g., wheelchair or bedside chair)?: Total Help needed to walk in hospital room?: Total Help needed climbing 3-5 steps with a railing? : Total 6 Click Score: 9    End of Session   Activity Tolerance: Treatment limited secondary to medical complications (Comment) Patient left: in bed;with call bell/phone within reach;with bed alarm set Nurse Communication: Mobility status PT Visit Diagnosis: Other symptoms and signs involving the nervous system (R29.898);Unsteadiness on feet (R26.81);Other abnormalities of gait and mobility (R26.89)    Time: 3790-2409 PT Time Calculation (min) (ACUTE ONLY): 20 min   Charges:   PT Evaluation $PT Eval Moderate Complexity: 1 Mod          Blanchard Kelch PT Acute Rehabilitation Services Pager [phone] Office [phone]   Rada Hay 09/09/2021, 2:00 PM

## 2021-09-09 NOTE — Discharge Instructions (Addendum)
Lymjev correction scale   210 - 270 = 1 units  271 - 330 = 2 units  331 - 390 = 3 units  391 - 450 - 4 units  451 - 510 = 5 units  511 - 570 = 6 units   Symptoms of Hypoglycemia: Silly, Sweaty, Shaky Check sugar if you have your meter.  If near or less than 70 mg/dl, treat with 1/2 cup juice or soda or take glucose tablets Check sugar 15 minutes after treatment.  If sugar still near or less than 70 mg/al and symptomatic, treat again and may need a snack with some protein (peanut butter with crackers, etc)   Carbohydrate Counting For People With Diabetes  Foods with carbohydrates make your blood glucose level go up. Learning how to count carbohydrates can help you control your blood glucose levels. First, identify the foods you eat that contain carbohydrates. Then, using the Foods with Carbohydrates chart, determine about how much carbohydrates are in your meals and snacks. Make sure you are eating foods with fiber, protein, and healthy fat along with your carbohydrate foods. Foods with Carbohydrates The following table shows carbohydrate foods that have about 15 grams of carbohydrate each. Using measuring cups, spoons, or a food scale when you first begin learning about carbohydrate counting can help you learn about the portion sizes you typically eat. The following foods have 15 grams carbohydrate each:  Grains 1 slice bread (1 ounce)  1 small tortilla (6-inch size)   large bagel (1 ounce)  1/3 cup pasta or rice (cooked)   hamburger or hot dog bun ( ounce)   cup cooked cereal   to  cup ready-to-eat cereal  2 taco shells (5-inch size) Fruit 1 small fresh fruit ( to 1 cup)   medium banana  17 small grapes (3 ounces)  1 cup melon or berries   cup canned or frozen fruit  2 tablespoons dried fruit (blueberries, cherries, cranberries, raisins)   cup unsweetened fruit juice  Starchy Vegetables  cup cooked beans, peas, corn, potatoes/sweet potatoes   large baked potato (3  ounces)  1 cup acorn or butternut squash  Snack Foods 3 to 6 crackers  8 potato chips or 13 tortilla chips ( ounce to 1 ounce)  3 cups popped popcorn  Dairy 3/4 cup (6 ounces) nonfat plain yogurt, or yogurt with sugar-free sweetener  1 cup milk  1 cup plain rice, soy, coconut or flavored almond milk Sweets and Desserts  cup ice cream or frozen yogurt  1 tablespoon jam, jelly, pancake syrup, table sugar, or honey  2 tablespoons light pancake syrup  1 inch square of frosted cake or 2 inch square of unfrosted cake  2 small cookies (2/3 ounce each) or  large cookie  Sometimes you'll have to estimate carbohydrate amounts if you don't know the exact recipe. One cup of mixed foods like soups can have 1 to 2 carbohydrate servings, while some casseroles might have 2 or more servings of carbohydrate. Foods that have less than 20 calories in each serving can be counted as "free" foods. Count 1 cup raw vegetables, or  cup cooked non-starchy vegetables as "free" foods. If you eat 3 or more servings at one meal, then count them as 1 carbohydrate serving.  Foods without Carbohydrates  Not all foods contain carbohydrates. Meat, some dairy, fats, non-starchy vegetables, and many beverages don't contain carbohydrate. So when you count carbohydrates, you can generally exclude chicken, pork, beef, fish, seafood, eggs, tofu, cheese, butter,  sour cream, avocado, nuts, seeds, olives, mayonnaise, water, black coffee, unsweetened tea, and zero-calorie drinks. Vegetables with no or low carbohydrate include green beans, cauliflower, tomatoes, and onions. How much carbohydrate should I eat at each meal?  Carbohydrate counting can help you plan your meals and manage your weight. Following are some starting points for carbohydrate intake at each meal. Work with your registered dietitian nutritionist to find the best range that works for your blood glucose and weight.   To Lose Weight To Maintain Weight  Women 2 - 3 carb  servings 3 - 4 carb servings  Men 3 - 4 carb servings 4 - 5 carb servings  Checking your blood glucose after meals will help you know if you need to adjust the timing, type, or number of carbohydrate servings in your meal plan. Achieve and keep a healthy body weight by balancing your food intake and physical activity.  Tips How should I plan my meals?  Plan for half the food on your plate to include non-starchy vegetables, like salad greens, broccoli, or carrots. Try to eat 3 to 5 servings of non-starchy vegetables every day. Have a protein food at each meal. Protein foods include chicken, fish, meat, eggs, or beans (note that beans contain carbohydrate). These two food groups (non-starchy vegetables and proteins) are low in carbohydrate. If you fill up your plate with these foods, you will eat less carbohydrate but still fill up your stomach. Try to limit your carbohydrate portion to  of the plate.  What fats are healthiest to eat?  Diabetes increases risk for heart disease. To help protect your heart, eat more healthy fats, such as olive oil, nuts, and avocado. Eat less saturated fats like butter, cream, and high-fat meats, like bacon and sausage. Avoid trans fats, which are in all foods that list "partially hydrogenated oil" as an ingredient. What should I drink?  Choose drinks that are not sweetened with sugar. The healthiest choices are water, carbonated or seltzer waters, and tea and coffee without added sugars.  Sweet drinks will make your blood glucose go up very quickly. One serving of soda or energy drink is  cup. It is best to drink these beverages only if your blood glucose is low.  Artificially sweetened, or diet drinks, typically do not increase your blood glucose if they have zero calories in them. Read labels of beverages, as some diet drinks do have carbohydrate and will raise your blood glucose. Label Reading Tips Read Nutrition Facts labels to find out how many grams of  carbohydrate are in a food you want to eat. Don't forget: sometimes serving sizes on the label aren't the same as how much food you are going to eat, so you may need to calculate how much carbohydrate is in the food you are serving yourself.   Carbohydrate Counting for People with Diabetes Sample 1-Day Menu  Breakfast  cup yogurt, low fat, low sugar (1 carbohydrate serving)   cup cereal, ready-to-eat, unsweetened (1 carbohydrate serving)  1 cup strawberries (1 carbohydrate serving)   cup almonds ( carbohydrate serving)  Lunch 1, 5 ounce can chunk light tuna  2 ounces cheese, low fat cheddar  6 whole wheat crackers (1 carbohydrate serving)  1 small apple (1 carbohydrate servings)   cup carrots ( carbohydrate serving)   cup snap peas  1 cup 1% milk (1 carbohydrate serving)   Evening Meal Stir fry made with: 3 ounces chicken  1 cup brown rice (3 carbohydrate servings)  cup broccoli ( carbohydrate serving)   cup green beans   cup onions  1 tablespoon olive oil  2 tablespoons teriyaki sauce ( carbohydrate serving)  Evening Snack 1 extra small banana (1 carbohydrate serving)  1 tablespoon peanut butter   Carbohydrate Counting for People with Diabetes Vegan Sample 1-Day Menu  Breakfast 1 cup cooked oatmeal (2 carbohydrate servings)   cup blueberries (1 carbohydrate serving)  2 tablespoons flaxseeds  1 cup soymilk fortified with calcium and vitamin D  1 cup coffee  Lunch 2 slices whole wheat bread (2 carbohydrate servings)   cup baked tofu   cup lettuce  2 slices tomato  2 slices avocado   cup baby carrots ( carbohydrate serving)  1 orange (1 carbohydrate serving)  1 cup soymilk fortified with calcium and vitamin D   Evening Meal Burrito made with: 1 6-inch corn tortilla (1 carbohydrate serving)  1 cup refried vegetarian beans (2 carbohydrate servings)   cup chopped tomatoes   cup lettuce   cup salsa  1/3 cup brown rice (1 carbohydrate serving)  1 tablespoon  olive oil for rice   cup zucchini   Evening Snack 6 small whole grain crackers (1 carbohydrate serving)  2 apricots ( carbohydrate serving)   cup unsalted peanuts ( carbohydrate serving)    Carbohydrate Counting for People with Diabetes Vegetarian (Lacto-Ovo) Sample 1-Day Menu  Breakfast 1 cup cooked oatmeal (2 carbohydrate servings)   cup blueberries (1 carbohydrate serving)  2 tablespoons flaxseeds  1 egg  1 cup 1% milk (1 carbohydrate serving)  1 cup coffee  Lunch 2 slices whole wheat bread (2 carbohydrate servings)  2 ounces low-fat cheese   cup lettuce  2 slices tomato  2 slices avocado   cup baby carrots ( carbohydrate serving)  1 orange (1 carbohydrate serving)  1 cup unsweetened tea  Evening Meal Burrito made with: 1 6-inch corn tortilla (1 carbohydrate serving)   cup refried vegetarian beans (1 carbohydrate serving)   cup tomatoes   cup lettuce   cup salsa  1/3 cup brown rice (1 carbohydrate serving)  1 tablespoon olive oil for rice   cup zucchini  1 cup 1% milk (1 carbohydrate serving)  Evening Snack 6 small whole grain crackers (1 carbohydrate serving)  2 apricots ( carbohydrate serving)   cup unsalted peanuts ( carbohydrate serving)    Copyright 2020  Academy of [employer] and Dietetics. All rights reserved.  Using Nutrition Labels: Carbohydrate  Serving Size  Look at the serving size. All the information on the label is based on this portion. Servings Per Container  The number of servings contained in the package. Guidelines for Carbohydrate  Look at the total grams of carbohydrate in the serving size.  1 carbohydrate choice = 15 grams of carbohydrate. Range of Carbohydrate Grams Per Choice  Carbohydrate Grams/Choice Carbohydrate Choices  6-10   11-20 1  21-25 1  26-35 2  36-40 2  41-50 3  51-55 3  56-65 4  66-70 4  71-80 5    Copyright 2020  Academy of [employer] and Dietetics. All rights reserved.

## 2021-09-09 NOTE — Progress Notes (Addendum)
Inpatient Diabetes Program Recommendations  AACE/ADA: New Consensus Statement on Inpatient Glycemic Control (2015)  Target Ranges:  Prepandial:   less than 140 mg/dL      Peak postprandial:   less than 180 mg/dL (1-2 hours)      Critically ill patients:  140 - 180 mg/dL  Results for Sherri [name], Sherri [name] (MRN [id number]) as of 09/09/2021 09:14  Ref. Range 09/08/2021 15:09 09/08/2021 17:36 09/08/2021 20:27 09/09/2021 00:14 09/09/2021 04:09 09/09/2021 04:34 09/09/2021 05:03 09/09/2021 07:37  Glucose-Capillary Latest Ref Range: 70 - 99 mg/dL 555 (HH)  8 units Novolog $RemoveBefor'@1559'GCkcSyQWlGEo$  309 (H) 73 144 (H)   1 unit Novolog 69 (L) 75 163 (H) 240 (H)  Results for Sherri [name], Sherri [name] (MRN [id number]) as of 09/09/2021 06:53  Ref. Range 01/25/2021 17:23 05/06/2021 05:41 08/08/2021 10:08 09/08/2021 15:30  Hemoglobin A1C Latest Ref Range: 4.8 - 5.6 % 12.3 (H) 13.0 (H) 12.0 (A) 12.5 (H)  (312 mg/dl)    Admit with: AMS/ Hyperglycemia/ Acute UTI  History: DM, Polysubstance Abuse  Home DM Meds: Dexcom Sensor       Farxiga 5 mg Daily       Tresiba 13 units Daily       Lyumjev (Insulin Lispro) 3 units TID  Current Orders: Novolog Sensitive Correction Scale/ SSI (0-9 units) Q4 hours    PCP: Minette Brine, FNP with [facility] Being treated by her PCP's Chronic Care Management team as well    MD- Looks like pt's insulin antibodies came back positive and her Endocrinologist (Dr. Kelton Pillar with [facility]) now classifying pt with Type 1 diabetes.  Pt told to:  - STOP Metformin - STOP Glipizide  - Continue Farxiga 10 mg daily  - Start Tresiba 12 units daily  - Start Humalog 3 units with each meal   Currently only has orders for Novolog SSI this AM.  Please consider starting Semglee 10 units Daily this AM (80% home dose to start)    Addendum 10:20am--Met w/ pt at bedside this AM.  Pt having back spasms and requesting medication.  Alerted RN Earnest Bailey to this info and RN awaiting orders from MD.  Discussed w/  pt conversion from T2 diabetes to T1 diabetes.  Explained to pt that she is making antibodies to her insulin producing pancreas cells and that she will require insulin for the remainder of her life to keep her CBGs within normal range and prevent DKA.  Explained to pt the 2 different insulins she has at home [place]) and how each work and how to take each.  Pt told me she has been NOT taking the [place] on some days when her CBGs are high.  We discussed the importance of taking her [place] Aas daily at the same time every day and not missing doses of the long-acting insulin even if she is not able to eat much.  Also explained to pt that the Lyumjev is for her meal intake and that she was also prescribed a sliding scale to correct high CBGs.  Explained how a correction/sliding scale works and told pt I would attach a copy of the scale to her d/c summary so she could have a printed version (pt stated she has the scale on her phone as well).  Pt asked me to call her ENDO office to inquire about the status of her Dexcom sensor.  Will place a call today and inquire.  Also reviewed signs and symptoms of Hypoglycemia and how to treat HYPO at home.  Pt apparently has  been given a Glucagon Rx as well but has yet to get it filled.  Encouraged pt to call her ENDO with further diabetes issues and to make sure she gets her Glucagon Rx filled after d/c.    --Will follow patient during hospitalization--  Wyn Quaker RN, MSN, CDE Diabetes Coordinator Inpatient Glycemic Control Team Team Pager: [phone] (8a-5p)

## 2021-09-09 NOTE — Chronic Care Management (AMB) (Addendum)
  Chronic Care Management   Inpatient Admit Review Note  09/09/2021 Name: Zavia Pullen MRN: [id number] DOB: 03-Apr-1966  Rohini Jaroszewski is a 55 y.o. year old female who is a primary care patient of Arnette Felts, FNP. Angalina Ante is actively engaged with the embedded care management team in the primary care practice and is being followed by [facility] embedded care coordination for assistance with disease management and care coordination needs related to  DM II, Asthma, Stiff Person Syndrome, and Depression .   Elliona Doddridge is currently admitted to the hospital for evaluation and treatment of  altered mental status, elevated LFTs, Hyperosmolar hyperglycemic state, and acute cystitis .    Plan: CM team will collaborate with [facility] and will follow patient post discharge.    [name], BSW, CDP Social Worker, Certified Dementia Practitioner [facility] Care Management [phone]
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
## 2021-09-09 NOTE — Progress Notes (Signed)
Occupational Therapy Evaluation  Patient lives alone in first floor apartment, reports typically independent with self care, drives and uses cane as needed. Note cuts on patient's face, reports recent fall due to onset of back spasms while walking outside. Patient had 3 spasms during session, one on toilet and two in standing during transfers needing max A x2 to safely pivot from toilet to recliner chair then recliner chair to bed. Patient becomes very rigid and unable to support herself without external assist. Also needed max A for lower body ADL and total A for peri care in standing due to these debilitating spasms. Patient stating she is behind on her normal regime with her medications and nursing notified. Due to recent fall and current assist level would recommend rehab at D/C, however if patient able to get back on her normal regime with medication and improves with mobility/ADLs may be able to D/C home. Acute OT to follow.    09/09/21 1400  OT Visit Information  Last OT Received On 09/09/21  Assistance Needed +2  PT/OT/SLP Co-Evaluation/Treatment Yes  Reason for Co-Treatment Complexity of the patient's impairments (multi-system involvement);For patient/therapist safety;To address functional/ADL transfers  PT goals addressed during session Mobility/safety with mobility  OT goals addressed during session ADL's and self-care  History of Present Illness 55 yo admitted 09/08/21 with AMS. PMH: DM, stiff person syndrome,anemia, poly substance abuse depression, chronic pain, patient had recent fall and trauma to face and right hand sprain.  Precautions  Precautions Fall  Precaution Comments has severe body spasms, stiffens up and cannot move.  Home Living  Family/patient expects to be discharged to: Private residence  Living Arrangements Alone  Available Help at Discharge Family;Available PRN/intermittently  Type of Home Apartment  Home Access Level entry  Home Layout One level  Bathroom  Shower/Tub Tub/shower unit  Home Equipment Walker - 2 wheels;Cane - single point  Prior Function  Level of Independence Independent with assistive device(s)  Comments uses cane, drives. states when she feels like she can't drive her sister will bring her to grocery store or has "[facility]" service that will bring her to MD appointments  Communication  Communication No difficulties  Pain Assessment  Pain Assessment Faces  Faces Pain Scale 4  Pain Location back spasms  Pain Descriptors / Indicators Discomfort  Pain Intervention(s) Monitored during session  Cognition  Arousal/Alertness Awake/alert  Behavior During Therapy [facility] for tasks assessed/performed  Overall Cognitive Status Within Functional Limits for tasks assessed  General Comments answering questions appropriately, following directions  Upper Extremity Assessment  Upper Extremity Assessment Generalized weakness  Lower Extremity Assessment  Lower Extremity Assessment Defer to PT evaluation  Cervical / Trunk Assessment  Cervical / Trunk Assessment Other exceptions  Cervical / Trunk Exceptions severe spasms when standing from toilet, trunk extension, difficulty in getting trunk to flex to sit down until spasms decreased  ADL  Overall ADL's  Needs assistance/impaired  Grooming Set up;Sitting;Wash/dry hands  Upper Body Bathing Supervision/ safety;Sitting  Lower Body Bathing Moderate assistance;Sitting/lateral leans;Sit to/from stand  Upper Body Dressing  Supervision/safety;Set up;Sitting  Lower Body Dressing Maximal assistance;Sit to/from stand;Sitting/lateral leans  Lower Body Dressing Details (indicate cue type and reason) had patient attempt to don clean mesh underwear however started to have back spasm and becomes rigid needing max A to complete task safely  Toilet Transfer Maximal assistance;+2 for safety/equipment;+2 for physical assistance;Cueing for safety;RW;Regular Toilet  Toilet Transfer Details (indicate cue type and  reason) onset of back spasm during transfer with patient becoming  rigidt and R LE in flexed position behind patient. Was unable to sit back onto toilet during spasm. had CNA arrive with recliner chair into bathroom for PT/OT to pivot patient to recliner once spasm abated  Toileting- Clothing Manipulation and Hygiene Total assistance;Sit to/from stand  Toileting - Clothing Manipulation Details (indicate cue type and reason) due to spasms and limited standing tolerance/balance  Functional mobility during ADLs Maximal assistance;+2 for physical assistance;+2 for safety/equipment  General ADL Comments back spasms significantly limiting patient and ability to perform ADLs/transfers  Bed Mobility  Overal bed mobility Needs Assistance  Bed Mobility Sit to Supine  Sit to supine Mod assist  General bed mobility comments assist with legs due to spasm onset.  Transfers  Overall transfer level Needs assistance  Equipment used Rolling walker (2 wheeled);2 person hand held assist  Transfers Sit to/from [employer] Transfers  Sit to Stand Max assist;+2 safety/equipment;+2 physical assistance  Stand pivot transfers Max assist;+2 physical assistance;+2 safety/equipment  General transfer comment please see toilet transfer in ADL section  Balance  Overall balance assessment History of Falls;Needs assistance  Sitting-balance support Feet supported  Sitting balance-Leahy Scale Poor  Sitting balance - Comments due to spasm onset  Standing balance support Bilateral upper extremity supported;During functional activity  Standing balance-Leahy Scale Zero  Standing balance comment body spasms, unable to stand  without max/total  support  OT - End of Session  Equipment Utilized During Treatment Gait belt;Rolling walker  Activity Tolerance Other (comment) (Treatment limited 2* back spasms)  Patient left in bed;with call bell/phone within reach;with bed alarm set  Nurse Communication Mobility status;Other  (comment) (spasms during therapy)  OT Assessment  OT Recommendation/Assessment Patient needs continued OT Services  OT Visit Diagnosis History of falling (Z91.81);Other abnormalities of gait and mobility (R26.89)  OT Problem List Decreased activity tolerance;Impaired balance (sitting and/or standing);Decreased safety awareness;Pain  OT Plan  OT Frequency (ACUTE ONLY) Min 2X/week  OT Treatment/Interventions (ACUTE ONLY) Self-care/ADL training;Balance training;Patient/family education;Therapeutic activities;DME and/or AE instruction  AM-PAC OT "6 Clicks" Daily Activity Outcome Measure (Version 2)  Help from another person eating meals? 4  Help from another person taking care of personal grooming? 3  Help from another person toileting, which includes using toliet, bedpan, or urinal? 1  Help from another person bathing (including washing, rinsing, drying)? 2  Help from another person to put on and taking off regular upper body clothing? 3  Help from another person to put on and taking off regular lower body clothing? 2  6 Click Score 15  Progressive Mobility  What is the highest level of mobility based on the progressive mobility assessment? Level 3 (Stands with assist) - Balance while standing  and cannot march in place  Mobility Out of bed for toileting;Sit up in bed/chair position for meals  OT Recommendation  Follow Up Recommendations SNF;Other (comment) (unless improves to baseline)  OT Equipment Tub/shower seat  Individuals Consulted  Consulted and Agree with Results and Recommendations Patient  Acute Rehab OT Goals  Patient Stated Goal to get my medicine for my spasms  OT Goal Formulation With patient  Time For Goal Achievement 09/23/21  Potential to Achieve Goals Good  OT Time Calculation  OT Start Time (ACUTE ONLY) 0928  OT Stop Time (ACUTE ONLY) 0948  OT Time Calculation (min) 20 min  OT General Charges  $OT Visit 1 Visit  OT Evaluation  $OT Eval Moderate Complexity 1 Mod   Written Expression  Dominant Hand Right   Marlyce Huge  OT OT pager: [phone]

## 2021-09-10 ENCOUNTER — Encounter (HOSPITAL_COMMUNITY): Payer: Self-pay | Admitting: Hospitalist

## 2021-09-10 ENCOUNTER — Other Ambulatory Visit: Payer: Self-pay | Admitting: Neurology

## 2021-09-10 DIAGNOSIS — G9340 Encephalopathy, unspecified: Secondary | ICD-10-CM | POA: Diagnosis not present

## 2021-09-10 LAB — CBC
HCT: 30.6 % — ABNORMAL LOW (ref 36.0–46.0)
Hemoglobin: 10.1 g/dL — ABNORMAL LOW (ref 12.0–15.0)
MCH: 27.9 pg (ref 26.0–34.0)
MCHC: 33 g/dL (ref 30.0–36.0)
MCV: 84.5 fL (ref 80.0–100.0)
Platelets: 247 10*3/uL (ref 150–400)
RBC: 3.62 MIL/uL — ABNORMAL LOW (ref 3.87–5.11)
RDW: 11.9 % (ref 11.5–15.5)
WBC: 7.2 10*3/uL (ref 4.0–10.5)
nRBC: 0 % (ref 0.0–0.2)

## 2021-09-10 LAB — BASIC METABOLIC PANEL
Anion gap: 7 (ref 5–15)
BUN: 17 mg/dL (ref 6–20)
CO2: 24 mmol/L (ref 22–32)
Calcium: 9.4 mg/dL (ref 8.9–10.3)
Chloride: 106 mmol/L (ref 98–111)
Creatinine, Ser: 1.08 mg/dL — ABNORMAL HIGH (ref 0.44–1.00)
GFR, Estimated: 60 mL/min — ABNORMAL LOW (ref >60)
Glucose, Bld: 284 mg/dL — ABNORMAL HIGH (ref 70–99)
Potassium: 4.3 mmol/L (ref 3.5–5.1)
Sodium: 137 mmol/L (ref 135–145)

## 2021-09-10 LAB — GLUCOSE, CAPILLARY
Glucose-Capillary: 214 mg/dL — ABNORMAL HIGH (ref 70–99)
Glucose-Capillary: 353 mg/dL — ABNORMAL HIGH (ref 70–99)
Glucose-Capillary: 156 mg/dL — ABNORMAL HIGH (ref 70–99)
Glucose-Capillary: 135 mg/dL — ABNORMAL HIGH (ref 70–99)

## 2021-09-10 LAB — URINE CULTURE: Culture: 40000 — AB

## 2021-09-10 LAB — MAGNESIUM: Magnesium: 1.9 mg/dL (ref 1.7–2.4)

## 2021-09-10 MED ORDER — INSULIN GLARGINE-YFGN 100 UNIT/ML ~~LOC~~ SOLN
15.0000 [IU] | Freq: Every day | SUBCUTANEOUS | Status: DC
  Filled 2021-09-10: qty 0.15

## 2021-09-10 MED ORDER — INSULIN ASPART 100 UNIT/ML IJ SOLN
0.0000 [IU] | Freq: Three times a day (TID) | INTRAMUSCULAR | Status: DC
  Administered 2021-09-11: 1 [IU] via SUBCUTANEOUS

## 2021-09-10 MED ORDER — INSULIN ASPART 100 UNIT/ML IJ SOLN: 2.0000 [IU] | Freq: Three times a day (TID) | INTRAMUSCULAR | Status: DC

## 2021-09-10 MED ORDER — DIPHENHYDRAMINE HCL 25 MG PO CAPS
25.0000 mg | ORAL_CAPSULE | Freq: Four times a day (QID) | ORAL | Status: DC | PRN
  Administered 2021-09-10 (×2): 25 mg via ORAL
  Filled 2021-09-10 (×2): qty 1

## 2021-09-10 MED ORDER — INSULIN GLARGINE-YFGN 100 UNIT/ML ~~LOC~~ SOLN
5.0000 [IU] | Freq: Once | SUBCUTANEOUS | Status: AC
  Administered 2021-09-10: 5 [IU] via SUBCUTANEOUS
  Filled 2021-09-10: qty 0.05

## 2021-09-10 MED ORDER — METRONIDAZOLE 500 MG PO TABS
500.0000 mg | ORAL_TABLET | Freq: Two times a day (BID) | ORAL | Status: DC
  Administered 2021-09-10 – 2021-09-11 (×3): 500 mg via ORAL
  Filled 2021-09-10 (×3): qty 1

## 2021-09-10 NOTE — Progress Notes (Signed)
Inpatient Diabetes Program Recommendations  AACE/ADA: New Consensus Statement on Inpatient Glycemic Control (2015)  Target Ranges:  Prepandial:   less than 140 mg/dL      Peak postprandial:   less than 180 mg/dL (1-2 hours)      Critically ill patients:  140 - 180 mg/dL   Lab Results  Component Value Date   GLUCAP 353 (H) 09/10/2021   HGBA1C 12.5 (H) 09/08/2021    Review of Glycemic Control Results for MONEE, DEMBECK (MRN [id number]) as of 09/10/2021 13:09  Ref. Range 09/09/2021 20:43 09/10/2021 07:28 09/10/2021 12:02  Glucose-Capillary Latest Ref Range: 70 - 99 mg/dL 93 333 (H) 545 (H)  History: DM, Polysubstance Abuse   Home DM Meds: Dexcom Sensor                             Farxiga 5 mg Daily                             Tresiba 13 units Daily                             Lyumjev (Insulin Lispro) 3 units TID   Current Orders: Novolog Sensitive Correction Scale/ SSI (0-9 units) tid with meals, Semglee 10 units daily (got one time dose of Semglee 5 units at 1230 today).    Inpatient Diabetes Program Recommendations:   Please consider reducing Novolog correction to "very Sensitive" 0-6 units and add Novolog meal coverage 2 units tid with meals (hold if patient eats less than 50%).  Increase Semglee slightly to 11 units daily.   Thanks,  Beryl Meager, RN, BC-ADM Inpatient Diabetes Coordinator Pager [phone]  (8a-5p)
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
## 2021-09-10 NOTE — Progress Notes (Signed)
Patient complaint of left elbow and arm itching and knees itching. RN noted some raise bumps on left elbow area and left knee area, but none on the back abdomen chest neck and the rest of the body. Notified Dr. Bruna Potter on call. Benadryl ordered.

## 2021-09-10 NOTE — Progress Notes (Signed)
Patient no signs and symptoms of distress. No SOB, no tongue swelling, no lip swelling, 100% oxygen saturation on RA. Resting in bed with eyes closed. Call light in reach, safety maintained.

## 2021-09-10 NOTE — Plan of Care (Signed)

## 2021-09-11 DIAGNOSIS — G9340 Encephalopathy, unspecified: Secondary | ICD-10-CM | POA: Diagnosis not present

## 2021-09-11 LAB — CBC
HCT: 30.6 % — ABNORMAL LOW (ref 36.0–46.0)
Hemoglobin: 10 g/dL — ABNORMAL LOW (ref 12.0–15.0)
MCH: 27.9 pg (ref 26.0–34.0)
MCHC: 32.7 g/dL (ref 30.0–36.0)
MCV: 85.5 fL (ref 80.0–100.0)
Platelets: 251 10*3/uL (ref 150–400)
RBC: 3.58 MIL/uL — ABNORMAL LOW (ref 3.87–5.11)
RDW: 12 % (ref 11.5–15.5)
WBC: 5.7 10*3/uL (ref 4.0–10.5)
nRBC: 0 % (ref 0.0–0.2)

## 2021-09-11 LAB — BASIC METABOLIC PANEL
Anion gap: 6 (ref 5–15)
BUN: 20 mg/dL (ref 6–20)
CO2: 26 mmol/L (ref 22–32)
Calcium: 8.9 mg/dL (ref 8.9–10.3)
Chloride: 105 mmol/L (ref 98–111)
Creatinine, Ser: 0.89 mg/dL (ref 0.44–1.00)
GFR, Estimated: >60 mL/min (ref >60)
Glucose, Bld: 229 mg/dL — ABNORMAL HIGH (ref 70–99)
Potassium: 4 mmol/L (ref 3.5–5.1)
Sodium: 137 mmol/L (ref 135–145)

## 2021-09-11 LAB — GLUCOSE, CAPILLARY
Glucose-Capillary: 157 mg/dL — ABNORMAL HIGH (ref 70–99)
Glucose-Capillary: 118 mg/dL — ABNORMAL HIGH (ref 70–99)

## 2021-09-11 LAB — MAGNESIUM: Magnesium: 2 mg/dL (ref 1.7–2.4)

## 2021-09-11 LAB — OSMOLALITY: Osmolality: 299 mOsm/kg — ABNORMAL HIGH (ref 275–295)

## 2021-09-11 MED ORDER — DIAZEPAM 10 MG PO TABS: 20.0000 mg | ORAL_TABLET | Freq: Three times a day (TID) | ORAL | Status: DC

## 2021-09-11 MED ORDER — METRONIDAZOLE 500 MG PO TABS: 500.0000 mg | ORAL_TABLET | Freq: Two times a day (BID) | ORAL | 0 refills | Status: AC

## 2021-09-11 MED ORDER — ADULT MULTIVITAMIN W/MINERALS CH: 1.0000 | ORAL_TABLET | Freq: Every day | ORAL | Status: DC

## 2021-09-11 MED ORDER — INSULIN GLARGINE-YFGN 100 UNIT/ML ~~LOC~~ SOLN
11.0000 [IU] | Freq: Every day | SUBCUTANEOUS | Status: DC
  Filled 2021-09-11: qty 0.11

## 2021-09-11 MED ORDER — FLUOXETINE HCL 10 MG PO CAPS: 10.0000 mg | ORAL_CAPSULE | Freq: Every day | ORAL | Status: DC

## 2021-09-11 NOTE — Progress Notes (Signed)
Physical Therapy Treatment Patient Details Name: Sherri [name] MRN: [id number] DOB: 1966/08/08 Today's Date: 09/11/2021   History of Present Illness 55 yo admitted 09/08/21 with AMS. PMH: DM, stiff person syndrome,anemia, poly substance abuse depression, chronic pain, patient had recent fall and trauma to face and right hand sprain.    PT Comments    Pt in good spirits and with marked improvement in activity tolerance and quality of movement.  Pt up to ambulate in halls including back stepping, side stepping and turning 360 degrees - no LOB and good safety awareness throughout.  Pt states she feels close to or at baseline and eager for return home.   Recommendations for follow up therapy are one component of a multi-disciplinary discharge planning process, led by the attending physician.  Recommendations may be updated based on patient status, additional functional criteria and insurance authorization.  Follow Up Recommendations  No PT follow up     Equipment Recommendations  None recommended by PT    Recommendations for Other Services       Precautions / Restrictions Precautions Precautions: Fall Restrictions Weight Bearing Restrictions: No     Mobility  Bed Mobility Overal bed mobility: Modified Independent             General bed mobility comments: No physical assist    Transfers Overall transfer level: Modified independent               General transfer comment: Good safety awareness, use of UEs to self assist, no physical assist  Ambulation/Gait Ambulation/Gait assistance: Independent;Modified independent (Device/Increase time) Gait Distance (Feet): 300 Feet Assistive device: Straight cane;None Gait Pattern/deviations: Step-through pattern;Decreased step length - right;Decreased step length - left;Decreased stance time - right;Shuffle;Wide base of support Gait velocity: decr   General Gait Details: Mild instability compensated with widened BOS and  decreased pace; use of SPC allows pt to increase speed with no deterioation in stability.  Antalgic gait improved with incresaed distance ambulated - pt states "my left leg gets stiff and has to loosen up".  Good awareness of deficits.   Stairs             Wheelchair Mobility    Modified Rankin (Stroke Patients Only)       Balance Overall balance assessment: History of Falls;Needs assistance Sitting-balance support: Feet supported;No upper extremity supported Sitting balance-Leahy Scale: Good     Standing balance support: No upper extremity supported Standing balance-Leahy Scale: Good                              Cognition Arousal/Alertness: Awake/alert Behavior During Therapy: WFL for tasks assessed/performed Overall Cognitive Status: Within Functional Limits for tasks assessed                                 General Comments: answering questions appropriately, following directions      Exercises      General Comments        Pertinent Vitals/Pain Pain Assessment: No/denies pain    Home Living                      Prior Function            PT Goals (current goals can now be found in the care plan section) Acute Rehab PT Goals Patient Stated Goal: Home PT Goal Formulation: With patient Time For  Goal Achievement: 09/23/21 Potential to Achieve Goals: Fair Progress towards PT goals: Progressing toward goals    Frequency    Min 2X/week      PT Plan Discharge plan needs to be updated    Co-evaluation              AM-PAC PT "6 Clicks" Mobility   Outcome Measure  Help needed turning from your back to your side while in a flat bed without using bedrails?: None Help needed moving from lying on your back to sitting on the side of a flat bed without using bedrails?: None Help needed moving to and from a bed to a chair (including a wheelchair)?: None Help needed standing up from a chair using your arms (e.g.,  wheelchair or bedside chair)?: None Help needed to walk in hospital room?: None Help needed climbing 3-5 steps with a railing? : A Little 6 Click Score: 23    End of Session Equipment Utilized During Treatment: Gait belt Activity Tolerance: Patient tolerated treatment well Patient left: in chair;with call bell/phone within reach Nurse Communication: Mobility status PT Visit Diagnosis: Other symptoms and signs involving the nervous system (R29.898);Unsteadiness on feet (R26.81);Other abnormalities of gait and mobility (R26.89)     Time: 0950-1009 PT Time Calculation (min) (ACUTE ONLY): 19 min  Charges:  $Gait Training: 8-22 mins                     Mauro Kaufmann PT Acute Rehabilitation Services Pager [phone] Office [phone]    Lillianah Swartzentruber 09/11/2021, 10:18 AM
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
## 2021-09-11 NOTE — Progress Notes (Signed)
Discharge instructions explained to patient. Next medications due are printed on her discharge instructions. She has one prescription to pick up at her pharmacy. Sister is here to pick her up and bring her home. Discharged via wheelchair.

## 2021-09-12 ENCOUNTER — Telehealth: Payer: Self-pay

## 2021-09-12 ENCOUNTER — Encounter: Payer: Self-pay | Admitting: Nurse Practitioner

## 2021-09-12 ENCOUNTER — Ambulatory Visit (INDEPENDENT_AMBULATORY_CARE_PROVIDER_SITE_OTHER): Payer: Medicare Other | Admitting: Nurse Practitioner

## 2021-09-12 ENCOUNTER — Ambulatory Visit: Payer: Medicare Other | Admitting: Nurse Practitioner

## 2021-09-12 ENCOUNTER — Other Ambulatory Visit: Payer: Self-pay

## 2021-09-12 ENCOUNTER — Ambulatory Visit: Payer: Medicare Other

## 2021-09-12 VITALS — BP 118/62 | HR 83 | Temp 98.4°F | Ht 63.0 in | Wt 106.6 lb

## 2021-09-12 DIAGNOSIS — F1410 Cocaine abuse, uncomplicated: Secondary | ICD-10-CM | POA: Diagnosis not present

## 2021-09-12 DIAGNOSIS — N1831 Chronic kidney disease, stage 3a: Secondary | ICD-10-CM | POA: Diagnosis not present

## 2021-09-12 DIAGNOSIS — E1165 Type 2 diabetes mellitus with hyperglycemia: Secondary | ICD-10-CM

## 2021-09-12 DIAGNOSIS — E1021 Type 1 diabetes mellitus with diabetic nephropathy: Secondary | ICD-10-CM

## 2021-09-12 DIAGNOSIS — G2582 Stiff-man syndrome: Secondary | ICD-10-CM

## 2021-09-12 DIAGNOSIS — R220 Localized swelling, mass and lump, head: Secondary | ICD-10-CM

## 2021-09-12 DIAGNOSIS — Z2821 Immunization not carried out because of patient refusal: Secondary | ICD-10-CM

## 2021-09-12 LAB — GC/CHLAMYDIA PROBE AMP (~~LOC~~) NOT AT ARMC
Chlamydia: NEGATIVE
Comment: NEGATIVE
Comment: NORMAL
Neisseria Gonorrhea: NEGATIVE

## 2021-09-12 NOTE — Telephone Encounter (Signed)
  Care Management   Follow Up Note   09/12/2021 Name: Sherri [name] MRN: [id number] DOB: May 06, 1966   Referred by: Arnette Felts, FNP Reason for referral : Chronic Care Management (Case Collaboration with Endocrinology office )  Contacted Dr. Cherylann Banas MD to assist with scheduling patient's post discharge follow up with Endocrinology. Spoke with CSR Morrie Sheldon who stated the patient will need to call the office to schedule this appointment. Informed PCP of the request for patient to call to self schedule. The patient was referred to the case management team for assistance with care management and care coordination.   Follow Up Plan: The care management team will reach out to the patient again over the next 7 days.   Delsa Sale, RN, BSN, CCM Care Management Coordinator [facility] Care Management/[facility]  Direct Phone: [phone]

## 2021-09-12 NOTE — Telephone Encounter (Signed)
LYUMJEV KWIKPEN) Pt requesting another sample(s) until pt can be seen again her next appt. Pt can be contacted [phone]
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
## 2021-09-12 NOTE — Patient Instructions (Signed)
Understanding Your Risk for Falls Each year, millions of people have serious injuries from falls. It is important to understand your risk for falling. Talk with your health care provider about your risk and what you can do to lower it. There are actions you can take athome to lower your risk and prevent falls. If you do have a serious fall, make sure to tell your health care provider.Falling once raises your risk of falling again. How can falls affect me? Serious injuries from falls are common. These include: Broken bones, such as hip fractures. Head injuries, such as traumatic brain injuries (TBI) or concussion. A fear of falling can cause you to avoid activities and stay at home. This canmake your muscles weaker and actually raise your risk for a fall. What can increase my risk? There are a number of risk factors that increase your risk for falling. The more risk factors you have, the higher your risk of falling. Serious injuries from a fall happen most often to people older than age 65. Children and youngadults ages 15-29 are also at higher risk. Common risk factors include: Weakness in the lower body. Lack (deficiency) of vitamin D. Being generally weak or confused due to long-term (chronic) illness. Dizziness or balance problems. Poor vision. Medicines that cause dizziness or drowsiness. These can include medicines for your blood pressure, heart, anxiety, insomnia, or edema, as well as pain medicines and muscle relaxants. Other risk factors include: Drinking alcohol. Having had a fall in the past. Having depression. Having foot pain or wearing improper footwear. Working at a dangerous job. Having any of the following in your home: Tripping hazards, such as floor clutter or loose rugs. Poor lighting. Pets. Having dementia or memory loss. What actions can I take to lower my risk of falling?     Physical activity Maintain physical fitness. Do strength and balance exercises.  Consider taking aregular class to build strength and balance. Yoga and tai chi are good options. Vision Have your eyes checked every year and your vision prescription updated asneeded. Walking aids and footwear Wear nonskid shoes. Do not wear high heels. Do not walk around the house in socks or slippers. Use a cane or walker as told by your health care provider. Home safety Attach secure railings on both sides of your stairs. Install grab bars for your tub, shower, and toilet. Use a bath mat in your tub or shower. Use good lighting in all rooms. Keep a flashlight near your bed. Make sure there is a clear path from your bed to the bathroom. Use night-lights. Do not use throw rugs. Make sure all carpeting is taped or tacked down securely. Remove all clutter from walkways and stairways, including extension cords. Repair uneven or broken steps. Avoid walking on icy or slippery surfaces. Walk on the grass instead of on icy or slick sidewalks. Use ice melt to get rid of ice on walkways. Use a cordless phone. Questions to ask your health care provider Can you help me check my risk for a fall? Do any of my medicines make me more likely to fall? Should I take a vitamin D supplement? What exercises can I do to improve my strength and balance? Should I make an appointment to have my vision checked? Do I need a bone density test to check for weak bones or osteoporosis? Would it help to use a cane or a walker? Where to find more information Centers for Disease Control and Prevention, STEAD[employer]: www.cdc.gov Community-Based Fall Prevention Programs:   www.cdc.gov National Institute on Aging: www.nia.nih.gov Contact a health care provider if: You fall at home. You are afraid of falling at home. You feel weak, drowsy, or dizzy. Summary Serious injuries from a fall happen most often to people older than age 65. Children and young adults ages 15-29 are also at higher risk. Talk with your health care  provider about your risks for falling and how to lower those risks. Taking certain precautions at home can lower your risk for falling. If you fall, always tell your health care provider. This information is not intended to replace advice given to you by your health care provider. Make sure you discuss any questions you have with your healthcare provider. Document Revised: 07/14/2020 Document Reviewed: 07/14/2020 Elsevier Patient Education  2022 Elsevier Inc.  

## 2021-09-12 NOTE — Chronic Care Management (AMB) (Signed)
  Care Management    Consult Note  09/12/2021 Name: Mathea Frieling MRN: [id number] DOB: [birth year]  Care management team received notification of patient's recent inpatient hospitalization related to DM .Based on review of health record, Fatin Bachicha is currently active in the embedded care coordination program.. Outbound call placed to the patient to assist with care coordination needs.   Review of patient status, including review of consultants reports, relevant laboratory and other test results, and collaboration with appropriate care team members and the patient's provider was performed as part of comprehensive patient evaluation and provision of chronic care management services.    SW placed a successful outbound call to the patient to assess for care coordination needs post discharge. Patient reports she is feeling better and is taking insulin as prescribed. Patient currently en route to her primary care providers office for an office visit.    Plan: SW will follow up with patient by phone over the next 10 days.  Collaboration with RN Care Manager to advise patient has discharged home.  [name], BSW, CDP Social Worker, Certified Dementia Practitioner [facility] Care Management [phone]

## 2021-09-13 LAB — CULTURE, BLOOD (ROUTINE X 2)
Culture: NO GROWTH
Culture: NO GROWTH

## 2021-09-14 ENCOUNTER — Telehealth: Payer: Self-pay

## 2021-09-14 NOTE — Chronic Care Management (AMB) (Signed)
    Called [facility], No answer, mailbox too full to leave message about appointment on 09-15-2021 at 12:30 via telephone visit with Cherylin Mylar, Pharm D.   Care Gaps: Covid vaccine overdue Yearly Ophthalmology overdue Shingrix overdue MAMMOGRAM (Yearly)  Star Rating Drug: Glipizide 5 mg- Last filled 07-27-2021 [facility] Metformin 500 mg- Last filled 07-19-2021 [facility] Farxiga 5 mg- Last filled 07-19-2021 [facility] (PAP?) Any gaps in medications fill history?  Huey Romans [facility] Clinical Pharmacist Assistant [phone]

## 2021-09-15 ENCOUNTER — Ambulatory Visit (INDEPENDENT_AMBULATORY_CARE_PROVIDER_SITE_OTHER): Payer: Medicare Other

## 2021-09-15 ENCOUNTER — Telehealth: Payer: Self-pay | Admitting: Internal Medicine

## 2021-09-15 DIAGNOSIS — J45909 Unspecified asthma, uncomplicated: Secondary | ICD-10-CM

## 2021-09-15 DIAGNOSIS — E1021 Type 1 diabetes mellitus with diabetic nephropathy: Secondary | ICD-10-CM

## 2021-09-15 NOTE — Progress Notes (Signed)
Chronic Care Management Pharmacy Note  09/22/2021 Name:  Sherri [name] MRN:  [id number] DOB:  Nov 20, 1966  Summary: Patient reports that she is trying to take her medication daily and be consistent with her health.  Recommendations/Changes made from today's visit: Recommend patient continue current medication regimen, continue to follow up with endocrinologist on a consistent basis.   Plan: Patient to follow up with endocrinologist to discuss receiving more sample medication.    Subjective: Sherri [name] is an 55 y.o. year old female who is a primary patient of Minette Brine, [place].  The CCM team was consulted for assistance with disease management and care coordination needs.    Engaged with patient by telephone for follow up visit in response to provider referral for pharmacy case management and/or care coordination services.   Consent to Services:  The patient was given information about Chronic Care Management services, agreed to services, and gave verbal consent prior to initiation of services.  Please see initial visit note for detailed documentation.   Patient Care Team: Minette Brine, FNP as PCP - General (General Practice) Rex Kras Claudette Stapler, RN as [place] Management Peggye Ley, Tillie Rung as [place] Management  Recent office visits: 09/12/2021 PCP OV  Recent consult visits: 08/08/2021 [facility] visits: 09/08/2021 Hospital Visit - Altered mental status   Objective:  Lab Results  Component Value Date   CREATININE 0.89 09/11/2021   BUN 20 09/11/2021   GFR 55.40 (L) 08/08/2021   GFRNONAA >60 09/11/2021   GFRAA 59 (L) 01/25/2021   NA 137 09/11/2021   K 4.0 09/11/2021   CALCIUM 8.9 09/11/2021   CO2 26 09/11/2021   GLUCOSE 229 (H) 09/11/2021    Lab Results  Component Value Date/Time   HGBA1C 12.5 (H) 09/08/2021 03:30 PM   HGBA1C 12.0 (A) 08/08/2021 10:08 AM   HGBA1C 13.0 (H) 05/06/2021 05:41 AM   GFR 55.40 (L)  08/08/2021 10:58 AM   MICROALBUR 45m 03/22/2021 11:39 AM   MICROALBUR 30 06/04/2020 12:29 PM    Last diabetic Eye exam: No results found for: HMDIABEYEEXA  Last diabetic Foot exam: No results found for: HMDIABFOOTEX   Lab Results  Component Value Date   CHOL 172 06/03/2020   HDL 75 06/03/2020   LDLCALC 83 06/03/2020   TRIG 76 06/03/2020   CHOLHDL 2.3 06/03/2020    Hepatic Function Latest Ref Rng & Units 09/09/2021 09/08/2021 08/09/2021  Total Protein 6.5 - 8.1 g/dL 6.5 6.8 6.4(L)  Albumin 3.5 - 5.0 g/dL 3.4(L) 3.5 4.1  AST 15 - 41 U/L 49(H) 60(H) 15  ALT 0 - 44 U/L 89(H) 106(H) 33  Alk Phosphatase 38 - 126 U/L 97 107 92  Total Bilirubin 0.3 - 1.2 mg/dL 0.3 0.6 0.3    Lab Results  Component Value Date/Time   TSH 0.555 09/09/2021 01:42 AM   TSH 2.28 08/08/2021 10:58 AM   TSH 1.150 06/03/2020 05:00 PM   FREET4 0.94 08/08/2021 10:58 AM    CBC Latest Ref Rng & Units 09/11/2021 09/10/2021 09/09/2021  WBC 4.0 - 10.5 K/uL 5.7 7.2 9.6  Hemoglobin 12.0 - 15.0 g/dL 10.0(L) 10.1(L) 10.3(L)  Hematocrit 36.0 - 46.0 % 30.6(L) 30.6(L) 31.7(L)  Platelets 150 - 400 K/uL 251 247 221    Lab Results  Component Value Date/Time   VD25OH 74.0 12/26/2016 10:02 AM   VD25OH 58.8 08/24/2016 10:11 AM    Clinical ASCVD: No  The 10-year ASCVD risk score (Arnett DK, et al., 2019) is: 4.8%  Values used to calculate the score:     Age: 55 years     Sex: Female     Is Non-Hispanic African American: Yes     Diabetic: Yes     Tobacco smoker: No     Systolic Blood Pressure: 696 mmHg     Is BP treated: No     HDL Cholesterol: 75 mg/dL     Total Cholesterol: 172 mg/dL    Depression screen [facility] 2/9 07/14/2021 06/03/2020 02/16/2020  Decreased Interest 0 0 0  Down, Depressed, Hopeless 0 0 0  PHQ - 2 Score 0 0 0  Altered sleeping - 0 -  Tired, decreased energy - 0 -  Change in appetite - 0 -  Feeling bad or failure about yourself  - 0 -  Trouble concentrating - 0 -  Moving slowly or fidgety/restless  - 0 -  Suicidal thoughts - 0 -  PHQ-9 Score - 0 -  Difficult doing work/chores - Not difficult at all -  Some recent data might be hidden    Social History   Tobacco Use  Smoking Status Never  Smokeless Tobacco Never  Tobacco Comments   Never Used Tobacco   BP Readings from Last 3 Encounters:  09/12/21 118/62  09/11/21 108/71  09/05/21 125/89   Pulse Readings from Last 3 Encounters:  09/12/21 83  09/11/21 65  09/05/21 95   Wt Readings from Last 3 Encounters:  09/12/21 106 lb 9.6 oz (48.4 kg)  09/09/21 105 lb 1.6 oz (47.7 kg)  09/05/21 94 lb (42.6 kg)   BMI Readings from Last 3 Encounters:  09/12/21 18.88 kg/m  09/09/21 18.62 kg/m  09/05/21 16.65 kg/m    Assessment/Interventions: Review of patient past medical history, allergies, medications, health status, including review of consultants reports, laboratory and other test data, was performed as part of comprehensive evaluation and provision of chronic care management services.   SDOH:  (Social Determinants of Health) assessments and interventions performed: No  SDOH Screenings   Alcohol Screen: Not on file  Depression (PHQ2-9): Low Risk    PHQ-2 Score: 0  Financial Resource Strain: High Risk   Difficulty of Paying Living Expenses: Very hard  Food Insecurity: Food Insecurity Present   Worried About Charity fundraiser in the Last Year: Often true   Arboriculturist in the Last Year: Often true  Housing: High Risk   Last Housing Risk Score: 2  Physical Activity: Inactive   Days of Exercise per Week: 0 days   Minutes of Exercise per Session: 0 min  Social Connections: Not on file  Stress: Not on file  Tobacco Use: Low Risk    Smoking Tobacco Use: Never   Smokeless Tobacco Use: Never  Transportation Needs: No Transportation Needs   Lack of Transportation (Medical): No   Lack of Transportation (Non-Medical): No    CCM Care Plan  Allergies  Allergen Reactions   Ibuprofen Other (See Comments)    Does  not take due to hx of renal insufficiency "I have kidney disease"    Lemon Flavor Swelling    Severe Lip Swelling  FRUIT per pt.     Amoxicillin Diarrhea and Other (See Comments)   Tylenol [Acetaminophen] Hives    Cannot take large quantities    Medications Reviewed Today     Reviewed by Mayford Knife, [place] (Pharmacist) on 09/15/21 at 1244  Med List Status: <None>   Medication Order Taking? Sig Documenting Provider Last Dose Status Informant  acetaminophen (TYLENOL) 500 MG tablet [id number] Yes Take 1 tablet (500 mg total) by mouth every 6 (six) hours as needed.  Patient taking differently: Take 500 mg by mouth every 6 (six) hours as needed for moderate pain.   Hans Eden, NP Taking Active   albuterol (VENTOLIN HFA) 108 (90 Base) MCG/ACT inhaler [id number] Yes TAKE 2 PUFFS BY MOUTH EVERY 6 HOURS AS NEEDED FOR WHEEZE OR SHORTNESS OF BREATH  Patient taking differently: Inhale 2 puffs into the lungs every 6 (six) hours as needed for wheezing or shortness of breath.   Minette Brine, FNP Taking Active   Ascorbic Acid (VITAMIN C) 1000 MG tablet [id number] Yes Take 1 tablet (1,000 mg total) by mouth daily. Minette Brine, FNP Taking Active Self  baclofen (LIORESAL) 20 MG tablet [id number] Yes Take 1 tablet 3 times daily Suzzanne Cloud, NP Taking Active Self  Cholecalciferol (VITAMIN D3) 10 MCG (400 UNIT) tablet [id number] Yes Take 400 Units by mouth daily. [provider] Taking Active Self  Continuous Blood Gluc Sensor (DEXCOM G6 SENSOR) MISC [id number] Yes 1 Device by Does not apply route as directed. [name], Melanie Crazier, MD Taking Active Self  Continuous Blood Gluc Transmit (DEXCOM G6 TRANSMITTER) MISC [id number] Yes 1 Device by Does not apply route as directed. [name], Melanie Crazier, MD Taking Active Self  Cranberry-Vitamin C-Vitamin E 4200-20-3 MG-MG-UNIT CAPS [id number] Yes Take 1 tablet by mouth daily. [provider] Taking Active Self  cyanocobalamin  (,VITAMIN B-12,) 1000 MCG/ML injection [id number] Yes Inject 1,000 mcg into the muscle every 3 (three) months. [provider] Taking Active Self           Med Note Mel Almond, MEGAN T   Mon Apr 14, 2019  2:24 PM)    dapagliflozin propanediol (FARXIGA) 5 MG TABS tablet [id number] Yes Take 1 tablet (5 mg total) by mouth daily before breakfast. Minette Brine, FNP Taking Active Self  desloratadine (CLARINEX) 5 MG tablet [id number] Yes TAKE 1 TABLET BY MOUTH  DAILY Minette Brine, FNP Taking Active Self  diazepam (VALIUM) 10 MG tablet [id number] Yes Take 2 tablets (20 mg total) by mouth in the morning, at noon, and at bedtime. Home med. Enzo Bi, MD Taking Active   diclofenac Sodium (VOLTAREN) 1 % GEL [id number] Yes Apply 2 g topically 4 (four) times daily. Hans Eden, NP Taking Active Self           Med Note Jimmey Ralph, [facility] I   Thu Sep 08, 2021  8:52 PM) Hasn't started  doxycycline (VIBRAMYCIN) 100 MG capsule [id number] Yes Take 1 capsule (100 mg total) by mouth 2 (two) times daily. Hans Eden, NP Taking Active Self           Med Note Dillard Cannon Sep 08, 2021  8:53 PM) ABT Start Date 09/05/21. 7 Day Course  EPINEPHRINE 0.3 mg/0.3 mL IJ SOAJ injection [id number] Yes INJECT INTRAMUSCULARLY 1  PEN AS NEEDED FOR ALLERGIC  RESPONSE AS DIRECTED BY MD. Kirke Corin MEDICAL HELP AFTER  USE. Minette Brine, FNP Taking Active Self  famotidine (PEPCID) 20 MG tablet [id number] Yes TAKE 1 TABLET BY MOUTH TWICE A DAY Minette Brine, FNP Taking Active Self  FLUoxetine (PROZAC) 10 MG capsule [id number] Yes Take 1 capsule (10 mg total) by mouth at bedtime. Enzo Bi, MD Taking Active   gabapentin (NEURONTIN) 300 MG capsule [id number] Yes TAKE 1 CAPSULE BY MOUTH TWO TIMES DAILY Suzzanne Cloud, NP Taking Active Self  Glucagon (GVOKE HYPOPEN 2-PACK)  0.5 MG/0.1ML SOAJ [id number] Yes Inject 0.1 mLs into the skin as needed (if blood sugar less than 70). Minette Brine, FNP Taking Active Self  glucose  blood test strip [id number] Yes Use as instructed to check sugars qac and qhs Bary Castilla, NP Taking Active Self  hydrOXYzine (VISTARIL) 100 MG capsule [id number] Yes TAKE 1 CAPSULE BY MOUTH 3  TIMES DAILY AS NEEDED FOR  ITCHING  Patient taking differently: Take 100 mg by mouth 3 (three) times daily as needed for itching.   Minette Brine, FNP Taking Active   insulin degludec (TRESIBA) 100 UNIT/ML FlexTouch Pen [id number] Yes Inject 13 Units into the skin daily. [provider] Taking Active Self  Insulin Lispro-aabc (LYUMJEV KWIKPEN) 100 UNIT/ML KwikPen [id number] Yes Use as directed  Patient taking differently: Inject 3 Units into the skin in the morning, at noon, and at bedtime. Use as directed   [name], Melanie Crazier, MD Taking Active   Insulin Pen Needle 32G X 4 MM MISC [id number] Yes 1 each by Does not apply route 2 (two) times daily. Dx E11.9 Minette Brine, FNP Taking Active Self  metroNIDAZOLE (FLAGYL) 500 MG tablet [id number] Yes Take 1 tablet (500 mg total) by mouth every 12 (twelve) hours for 6 days. Enzo Bi, MD Taking Active   Multiple Vitamin (MULTIVITAMIN WITH MINERALS) TABS tablet [id number] Yes Take 1 tablet by mouth daily. Enzo Bi, MD Taking Active   vitamin E 180 MG (400 UNITS) capsule [id number] Yes Take 400 Units by mouth daily.  [provider] Taking Active Self            Patient Active Problem List   Diagnosis Date Noted   Malnutrition of moderate degree 09/09/2021   AMS (altered mental status) 09/09/2021   Acute encephalopathy 09/08/2021   Acute lower UTI 09/08/2021   Elevated LFTs 09/08/2021   Elevated lipase 09/08/2021   Hashimoto's disease 08/09/2021   Hypoglycemia 05/05/2021   AKI (acute kidney injury) ([place]) 05/05/2021   Anxiety    Chronic rhinitis 04/24/2019   Keloid 10/10/2018   Depression 01/02/2017   History of DVT (deep vein thrombosis) 01/02/2017   Chronic pain syndrome 01/02/2017   Cocaine abuse ([place]) 01/02/2017    Dysphagia    Adjustment insomnia    Distal radius fracture, right 08/16/2016   Postconcussion syndrome 12/30/2014   Tremor 03/11/2014   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   Hiatal hernia 03/21/2013   Anemia, iron deficiency 01/29/2013   Spasm of muscle 09/02/2012   Anemia 08/28/2012   Mild intermittent asthma 08/27/2012   Chronic back pain    Pernicious anemia 10/30/2011   DEGENERATIVE [place] DISEASE 08/18/2010   Asthma with bronchitis 07/28/2010   Recurrent urticaria 07/28/2010    Immunization History  Administered Date(s) Administered   Tdap 04/25/2016, 09/08/2021    Conditions to be addressed/monitored:  Diabetes and Asthma  Care Plan : [place]  Updates made by Mayford Knife, [place] since 09/22/2021 12:00 AM     Problem: DM, ASTHMA   Priority: High     Long-Range Goal: Disease Management   Start Date: 07/05/2021  Recent Progress: On track  Priority: High  Note:   Current Barriers:  Unable to independently monitor therapeutic efficacy Unable to self administer medications as prescribed Does not adhere to prescribed medication regimen Does not maintain contact with provider office Does not contact provider office for questions/concerns  Pharmacist Clinical Goal(s):  Patient will verbalize ability to afford treatment regimen achieve adherence to monitoring guidelines and medication  adherence to achieve therapeutic efficacy contact provider office for questions/concerns as evidenced notation of same in electronic health record through collaboration with PharmD and provider.   Interventions: 1:1 collaboration with Minette Brine, FNP regarding development and update of comprehensive plan of care as evidenced by provider attestation and co-signature Inter-disciplinary care team collaboration (see longitudinal plan of care) Comprehensive medication review performed; medication list updated in electronic medical record  Diabetes (A1c goal  <7%) -Uncontrolled -Current medications: Farxiga 5 mg tablet daily  Tresiba 100 unit/ml - 13 units into the skin daily Insulin lispro-aabc: use as directed -Current home glucose readings: she is writing down her BS readings  fasting glucose: 341,241, 79  -Denies hypoglycemic/hyperglycemic symptoms -Current meal patterns:  breakfast: oatmeal with sugar free yogurt, egg and cheese rolls   lunch: sandwich, or a hot dog bun with sliced ham and mayonnaise with sugar free yogurt dinner: microwave ready green beans, with red potatoes, and a sandwich snacks: glucerna drinks: zero gatorade, diet cranberry juice, diet ginger ale - drink half bottle and leave the rest, diet ocean spray cranberry mango juice . Drinking more water -Current exercise: not currently exercising -Patient reports that she tried to call the endocrinologist to find out more about Lyumjev sample pen -Educated on A1c and blood sugar goals; Complications of diabetes including kidney damage, retinal damage, and cardiovascular disease; Prevention and management of hypoglycemic episodes; -Counseled to check feet daily and get yearly eye exams -Recommended to continue current medication  Asthma(Goal: control symptoms and prevent exacerbations) -Controlled -Current treatment  Albuterol HFA- inhale one puff every 6 hours as needed.  -Exacerbations requiring treatment in last 6 months: 0 -Patient denies consistent use of maintenance inhaler -Frequency of rescue inhaler use: rare, none in the past four months  -Counseled on Proper inhaler technique; When to use rescue inhaler -Recommended to continue current medication  Patient Goals/Self-Care Activities Patient will:  - take medications as prescribed  Follow Up Plan: The patient has been provided with contact information for the care management team and has been advised to call with any health related questions or concerns.       Medication Assistance: None required.   Patient affirms current coverage meets needs.  Compliance/Adherence/Medication fill history: Care Gaps: Opthamology Exam  Shingrix Vaccine   Star-Rating Drugs: Farxiga 5 mg   Patient's preferred pharmacy is:  [id number] [place]Phone: [phone]: [phone] [facility] [address]Phone: [phone]: [phone] [facility]  ([address] CRe[facility]LC[facility] [place]Phone: [phone]: [phone] [id number] [place], [address] [place]hone: [phone]: [phone]hone: [phone]: [id number]C [address] [place]one: [phone]: [phone] (New Address) - [address] [place] [place] Previously: [address] [address]one: [phone]: [phone], IncSpring LakeY [address]one: [phone]: [phone]  [facility] ([facility]) - [place], [place] [address] Phone: [phone] Fax: [phone]  Uses pill box? No - patient reports that she prefers using medication pill bottles  Pt endorses 95% compliance  We discussed: Benefits of medication synchronization, packaging and delivery as well as enhanced pharmacist oversight with  [employer]. Patient decided to: Continue current medication management strategy  Care Plan and Follow Up Patient Decision:  Patient agrees to Care Plan and Follow-up.  Plan: The patient has been provided with contact information for the care management team and has been advised to call with any health related questions or concerns.   Orlando Penner, PharmD Clinical Pharmacist [facility] [phone]

## 2021-09-15 NOTE — Telephone Encounter (Signed)
Patient picked up sample 

## 2021-09-15 NOTE — Telephone Encounter (Signed)
Following up on previous call 9/19: [facility]) Pt requesting another sample(s) until pt can be seen again her next appt. Pt can be contacted [phone].

## 2021-09-15 NOTE — Telephone Encounter (Signed)
Patient aware that she can pick up a sample

## 2021-09-20 ENCOUNTER — Telehealth: Payer: Self-pay

## 2021-09-20 ENCOUNTER — Telehealth: Payer: Medicare Other

## 2021-09-20 NOTE — Telephone Encounter (Signed)
  Care Management   Follow Up Note   09/20/2021 Name: Catrinia Racicot MRN: [id number] DOB: 1966/08/22   Referred by: Arnette Felts, FNP Reason for referral : Chronic Care Management (Unsuccessful call)   An unsuccessful telephone outreach was attempted today. The patient was referred to the case management team for assistance with care management and care coordination. SW left a HIPAA compliant voice message requesting a return call.  Follow Up Plan: The care management team will reach out to the patient again over the next 14 days.   [name], BSW, CDP Social Worker, Certified Dementia Practitioner [facility] Care Management [phone]

## 2021-09-22 ENCOUNTER — Ambulatory Visit: Payer: Medicare Other

## 2021-09-22 DIAGNOSIS — G2582 Stiff-man syndrome: Secondary | ICD-10-CM

## 2021-09-22 DIAGNOSIS — E1021 Type 1 diabetes mellitus with diabetic nephropathy: Secondary | ICD-10-CM

## 2021-09-22 DIAGNOSIS — F32A Depression, unspecified: Secondary | ICD-10-CM
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
## 2021-09-22 NOTE — Patient Instructions (Signed)
Visit Information It was great speaking with you today!  Please let me know if you have any questions about our visit.   Goals Addressed             This Visit's Progress    Manage My Medicine       Timeframe:  Long-Range Goal Priority:  High Start Date: 01/31/2021                        Expected End Date: 09/15/2022  Follow Up Date 10/13/2021  In Progress: - call for medicine refill 2 or 3 days before it runs out - call if I am sick and can't take my medicine - keep a list of all the medicines I take; vitamins and herbals too - use a pillbox to sort medicine - use an alarm clock or phone to remind me to take my medicine    Why is this important?   These steps will help you keep on track with your medicines.           Care Plan : CCM Pharmacy Care Plan  Updates made by Harlan Stains, RPH since 09/22/2021 12:00 AM     Problem: DM, ASTHMA   Priority: High     Long-Range Goal: Disease Management   Start Date: 07/05/2021  Recent Progress: On track  Priority: High  Note:   Current Barriers:  Unable to independently monitor therapeutic efficacy Unable to self administer medications as prescribed Does not adhere to prescribed medication regimen Does not maintain contact with provider office Does not contact provider office for questions/concerns  Pharmacist Clinical Goal(s):  Patient will verbalize ability to afford treatment regimen achieve adherence to monitoring guidelines and medication adherence to achieve therapeutic efficacy contact provider office for questions/concerns as evidenced notation of same in electronic health record through collaboration with PharmD and provider.   Interventions: 1:1 collaboration with Arnette Felts, FNP regarding development and update of comprehensive plan of care as evidenced by provider attestation and co-signature Inter-disciplinary care team collaboration (see longitudinal plan of care) Comprehensive medication review  performed; medication list updated in electronic medical record  Diabetes (A1c goal <7%) -Uncontrolled -Current medications: Farxiga 5 mg tablet daily  Tresiba 100 unit/ml - 13 units into the skin daily Insulin lispro-aabc: use as directed -Current home glucose readings: she is writing down her BS readings  fasting glucose: 341,241, 79  -Denies hypoglycemic/hyperglycemic symptoms -Current meal patterns:  breakfast: oatmeal with sugar free yogurt, egg and cheese rolls   lunch: sandwich, or a hot dog bun with sliced ham and mayonnaise with sugar free yogurt dinner: microwave ready green beans, with red potatoes, and a sandwich snacks: glucerna drinks: zero gatorade, diet cranberry juice, diet ginger ale - drink half bottle and leave the rest, diet ocean spray cranberry mango juice . Drinking more water -Current exercise: not currently exercising -Patient reports that she tried to call the endocrinologist to find out more about Lyumjev sample pen -Educated on A1c and blood sugar goals; Complications of diabetes including kidney damage, retinal damage, and cardiovascular disease; Prevention and management of hypoglycemic episodes; -Counseled to check feet daily and get yearly eye exams -Recommended to continue current medication  Asthma(Goal: control symptoms and prevent exacerbations) -Controlled -Current treatment  Albuterol HFA- inhale one puff every 6 hours as needed.  -Exacerbations requiring treatment in last 6 months: 0 -Patient denies consistent use of maintenance inhaler -Frequency of rescue inhaler use: rare, none in the past four  months  -Counseled on Proper inhaler technique; When to use rescue inhaler -Recommended to continue current medication  Patient Goals/Self-Care Activities Patient will:  - take medications as prescribed  Follow Up Plan: The patient has been provided with contact information for the care management team and has been advised to call with any  health related questions or concerns.        Patient agreed to services and verbal consent obtained.   The patient verbalized understanding of instructions, educational materials, and care plan provided today and agreed to receive a mailed copy of patient instructions, educational materials, and care plan.   Cherylin Mylar, PharmD Clinical Pharmacist [facility] [phone]
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
## 2021-09-23 DIAGNOSIS — Z794 Long term (current) use of insulin: Secondary | ICD-10-CM

## 2021-09-23 DIAGNOSIS — E1021 Type 1 diabetes mellitus with diabetic nephropathy: Secondary | ICD-10-CM

## 2021-09-23 DIAGNOSIS — F32A Depression, unspecified: Secondary | ICD-10-CM

## 2021-09-23 DIAGNOSIS — E1165 Type 2 diabetes mellitus with hyperglycemia: Secondary | ICD-10-CM | POA: Diagnosis not present

## 2021-09-23 DIAGNOSIS — J45909 Unspecified asthma, uncomplicated: Secondary | ICD-10-CM

## 2021-09-28 ENCOUNTER — Other Ambulatory Visit: Payer: Self-pay | Admitting: Nurse Practitioner

## 2021-09-30 ENCOUNTER — Telehealth: Payer: Medicare Other

## 2021-09-30 ENCOUNTER — Ambulatory Visit: Payer: Self-pay

## 2021-09-30 ENCOUNTER — Ambulatory Visit (INDEPENDENT_AMBULATORY_CARE_PROVIDER_SITE_OTHER): Payer: Medicare Other

## 2021-09-30 DIAGNOSIS — K219 Gastro-esophageal reflux disease without esophagitis: Secondary | ICD-10-CM

## 2021-09-30 DIAGNOSIS — N1831 Chronic kidney disease, stage 3a: Secondary | ICD-10-CM

## 2021-09-30 DIAGNOSIS — E1021 Type 1 diabetes mellitus with diabetic nephropathy: Secondary | ICD-10-CM

## 2021-09-30 DIAGNOSIS — J45909 Unspecified asthma, uncomplicated: Secondary | ICD-10-CM

## 2021-09-30 DIAGNOSIS — F32A Depression, unspecified: Secondary | ICD-10-CM

## 2021-09-30 DIAGNOSIS — G2582 Stiff-man syndrome: Secondary | ICD-10-CM

## 2021-09-30 DIAGNOSIS — R410 Disorientation, unspecified: Secondary | ICD-10-CM
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
## 2021-09-30 NOTE — Chronic Care Management (AMB) (Signed)
Chronic Care Management   CCM RN Visit Note  09/30/2021 Name: Sherri [name] MRN: [id number] DOB: 1966/02/27  Subjective: Sherri [name] is a 55 y.o. year old female who is a primary care patient of Arnette Felts, FNP. The care management team was consulted for assistance with disease management and care coordination needs.    Collaboration with embedded BSW [name]  for  Case Collaboration   in response to provider referral for case management and/or care coordination services.   Consent to Services:  The patient was given information about Chronic Care Management services, agreed to services, and gave verbal consent prior to initiation of services.  Please see initial visit note for detailed documentation.   Patient agreed to services and verbal consent obtained.   Assessment: Review of patient past medical history, allergies, medications, health status, including review of consultants reports, laboratory and other test data, was performed as part of comprehensive evaluation and provision of chronic care management services.   SDOH (Social Determinants of Health) assessments and interventions performed:    CCM Care Plan  Allergies  Allergen Reactions   Ibuprofen Other (See Comments)    Does not take due to hx of renal insufficiency "I have kidney disease"    Lemon Flavor Swelling    Severe Lip Swelling  FRUIT per pt.     Amoxicillin Diarrhea and Other (See Comments)   Tylenol [Acetaminophen] Hives    Cannot take large quantities    Outpatient Encounter Medications as of 09/30/2021  Medication Sig Note   acetaminophen (TYLENOL) 500 MG tablet Take 1 tablet (500 mg total) by mouth every 6 (six) hours as needed. (Patient taking differently: Take 500 mg by mouth every 6 (six) hours as needed for moderate pain.)    albuterol (VENTOLIN HFA) 108 (90 Base) MCG/ACT inhaler TAKE 2 PUFFS BY MOUTH EVERY 6 HOURS AS NEEDED FOR WHEEZE OR SHORTNESS OF BREATH (Patient taking differently:  Inhale 2 puffs into the lungs every 6 (six) hours as needed for wheezing or shortness of breath.)    Ascorbic Acid (VITAMIN C) 1000 MG tablet TAKE 1 TABLET BY MOUTH EVERY DAY    baclofen (LIORESAL) 20 MG tablet Take 1 tablet 3 times daily    Cholecalciferol (VITAMIN D3) 10 MCG (400 UNIT) tablet Take 400 Units by mouth daily.    Continuous Blood Gluc Sensor (DEXCOM G6 SENSOR) MISC 1 Device by Does not apply route as directed.    Continuous Blood Gluc Transmit (DEXCOM G6 TRANSMITTER) MISC 1 Device by Does not apply route as directed.    Cranberry-Vitamin C-Vitamin E 4200-20-3 MG-MG-UNIT CAPS Take 1 tablet by mouth daily.    cyanocobalamin (,VITAMIN B-12,) 1000 MCG/ML injection Inject 1,000 mcg into the muscle every 3 (three) months.    dapagliflozin propanediol (FARXIGA) 5 MG TABS tablet Take 1 tablet (5 mg total) by mouth daily before breakfast.    desloratadine (CLARINEX) 5 MG tablet TAKE 1 TABLET BY MOUTH  DAILY    diazepam (VALIUM) 10 MG tablet Take 2 tablets (20 mg total) by mouth in the morning, at noon, and at bedtime. Home med.    diclofenac Sodium (VOLTAREN) 1 % GEL Apply 2 g topically 4 (four) times daily. 09/08/2021: Hasn't started   doxycycline (VIBRAMYCIN) 100 MG capsule Take 1 capsule (100 mg total) by mouth 2 (two) times daily. 09/08/2021: ABT Start Date 09/05/21. 7 Day Course   EPINEPHRINE 0.3 mg/0.3 mL IJ SOAJ injection INJECT INTRAMUSCULARLY 1  PEN AS NEEDED FOR ALLERGIC  RESPONSE AS  DIRECTED BY MD. SEEK MEDICAL HELP AFTER  USE.    famotidine (PEPCID) 20 MG tablet TAKE 1 TABLET BY MOUTH TWICE A DAY    FLUoxetine (PROZAC) 10 MG capsule Take 1 capsule (10 mg total) by mouth at bedtime.    gabapentin (NEURONTIN) 300 MG capsule TAKE 1 CAPSULE BY MOUTH TWO TIMES DAILY    Glucagon (GVOKE HYPOPEN 2-PACK) 0.5 MG/0.1ML SOAJ Inject 0.1 mLs into the skin as needed (if blood sugar less than 70).    glucose blood test strip Use as instructed to check sugars qac and qhs    hydrOXYzine (VISTARIL)  100 MG capsule TAKE 1 CAPSULE BY MOUTH 3  TIMES DAILY AS NEEDED FOR  ITCHING (Patient taking differently: Take 100 mg by mouth 3 (three) times daily as needed for itching.)    insulin degludec (TRESIBA) 100 UNIT/ML FlexTouch Pen Inject 13 Units into the skin daily.    Insulin Lispro-aabc (LYUMJEV KWIKPEN) 100 UNIT/ML KwikPen Use as directed (Patient taking differently: Inject 3 Units into the skin in the morning, at noon, and at bedtime. Use as directed)    Insulin Pen Needle 32G X 4 MM MISC 1 each by Does not apply route 2 (two) times daily. Dx E11.9    Multiple Vitamin (MULTIVITAMIN WITH MINERALS) TABS tablet Take 1 tablet by mouth daily.    vitamin E 180 MG (400 UNITS) capsule Take 400 Units by mouth daily.     No facility-administered encounter medications on file as of 09/30/2021.    Patient Active Problem List   Diagnosis Date Noted   Malnutrition of moderate degree 09/09/2021   AMS (altered mental status) 09/09/2021   Acute encephalopathy 09/08/2021   Acute lower UTI 09/08/2021   Elevated LFTs 09/08/2021   Elevated lipase 09/08/2021   Hashimoto's disease 08/09/2021   Hypoglycemia 05/05/2021   AKI (acute kidney injury) (HCC) 05/05/2021   Anxiety    Chronic rhinitis 04/24/2019   Keloid 10/10/2018   Depression 01/02/2017   History of DVT (deep vein thrombosis) 01/02/2017   Chronic pain syndrome 01/02/2017   Cocaine abuse (HCC) 01/02/2017   Dysphagia    Adjustment insomnia    Distal radius fracture, right 08/16/2016   Postconcussion syndrome 12/30/2014   Tremor 03/11/2014   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   Hiatal hernia 03/21/2013   Anemia, iron deficiency 01/29/2013   Spasm of muscle 09/02/2012   Anemia 08/28/2012   Mild intermittent asthma 08/27/2012   Chronic back pain    Pernicious anemia 10/30/2011   DEGENERATIVE DISC DISEASE 08/18/2010   Asthma with bronchitis 07/28/2010   Recurrent urticaria 07/28/2010    Conditions to be addressed/monitored: DMI, Stiff-Person  Syndrome, Asthma with bronchitis, GERD, Depression, Hashimoto's disease  Care Plan : Acute change in Mental status  Updates made by [name], RN since 09/30/2021 12:00 AM     Problem: Acute change in mental status   Priority: High     Goal: Altered Mental Status evaluated and treated   Start Date: 09/08/2021  Expected End Date: 12/24/2021  Recent Progress: On track  Priority: High  Note:   Current Barriers:  Ineffective Self Health Maintenance in a patient with DMI, Stiff-Person Syndrome, Asthma with bronchitis, GERD, Depression, Hashimoto's disease Knowledge deficit related to Self Health Management of Diabetes Mellitus Acute change in Cognitive Status  Does not take medications as prescribed Does not refill medications when supply is low Financial Restraints Clinical Goal(s): 09/30/21 Goal date extended  Collaboration with Arnette Felts, FNP regarding development and update  of comprehensive plan of care as evidenced by provider attestation and co-signature Inter-disciplinary care team collaboration (see longitudinal plan of care) patient will work with care management team to address care coordination and chronic disease management needs related to Disease Management Educational Needs Care Coordination Medication Management and Education Psychosocial Support   Interventions:  09/30/21 Case Collaboration  Case Collaboration with embedded BSW [name] for patient status update,  Discussed patient is having difficulty with short term memory recall and mis-stated the correct day of the week when speaking with Enrique Sack today for a SW update Determined Ms. Rubinstein was unable to recall making a recent dermatology appointment Determined patient continues to be followed by [facility] with next appointment scheduled out to January 2023 Discussed Lurena Nida will send Buel Ream NP an in basket message to update her on neuro changes related to memory and request Ms. Dudash be given an  earlier appointment for further evaluation of these symptoms Discussed this RN CM will follow up with Ms. Sabia next week to further assess and assist as needed Patient Goals:  -patient will follow up with [facility] for further evaluation of memory changes   Follow Up Plan: Telephone follow up appointment with care management team member scheduled for: 10/04/21     Plan:Telephone follow up appointment with care management team member scheduled for:  10/04/21  Delsa Sale, RN, BSN, CCM Care Management Coordinator [facility]  Direct Phone: [phone]

## 2021-10-04 ENCOUNTER — Telehealth: Payer: Medicare Other

## 2021-10-04 ENCOUNTER — Ambulatory Visit: Payer: Self-pay

## 2021-10-04 ENCOUNTER — Other Ambulatory Visit: Payer: Self-pay

## 2021-10-04 DIAGNOSIS — E1021 Type 1 diabetes mellitus with diabetic nephropathy: Secondary | ICD-10-CM

## 2021-10-04 DIAGNOSIS — G2582 Stiff-man syndrome: Secondary | ICD-10-CM

## 2021-10-04 DIAGNOSIS — K219 Gastro-esophageal reflux disease without esophagitis: Secondary | ICD-10-CM

## 2021-10-04 DIAGNOSIS — F32A Depression, unspecified: Secondary | ICD-10-CM

## 2021-10-04 DIAGNOSIS — N1831 Chronic kidney disease, stage 3a: Secondary | ICD-10-CM

## 2021-10-04 DIAGNOSIS — J45909 Unspecified asthma, uncomplicated: Secondary | ICD-10-CM

## 2021-10-04 DIAGNOSIS — E063 Autoimmune thyroiditis: Secondary | ICD-10-CM

## 2021-10-04 MED ORDER — DIAZEPAM 10 MG PO TABS: 20.0000 mg | ORAL_TABLET | Freq: Three times a day (TID) | ORAL | 1 refills | Status: DC

## 2021-10-04 NOTE — Telephone Encounter (Signed)
Received diazepam refill request from [facility]. Patient is up to date on her appts. [place] Controlled Substance Registry checked and is appropriate. It does not appear that patient used her 05/06/21 RX for diazepam but has been using the refills from the 03/23/2021 RX. Will send to Dr. Anne Hahn for review and approval.

## 2021-10-05 ENCOUNTER — Ambulatory Visit: Payer: Medicare Other

## 2021-10-05 DIAGNOSIS — G2582 Stiff-man syndrome: Secondary | ICD-10-CM

## 2021-10-05 DIAGNOSIS — E1021 Type 1 diabetes mellitus with diabetic nephropathy: Secondary | ICD-10-CM

## 2021-10-05 DIAGNOSIS — F32A Depression, unspecified: Secondary | ICD-10-CM

## 2021-10-05 NOTE — Chronic Care Management (AMB) (Signed)
Chronic Care Management   CCM RN Visit Note  10/04/2021 Name: Essynce Munsch MRN: [id number] DOB: May 03, 1966  Subjective: Annalycia Done is a 55 y.o. year old female who is a primary care patient of Minette Brine, [place]. The care management team was consulted for assistance with disease management and care coordination needs.    Engaged with patient by telephone for follow up visit in response to provider referral for case management and/or care coordination services.   Consent to Services:  The patient was given information about Chronic Care Management services, agreed to services, and gave verbal consent prior to initiation of services.  Please see initial visit note for detailed documentation.   Patient agreed to services and verbal consent obtained.   Assessment: Review of patient past medical history, allergies, medications, health status, including review of consultants reports, laboratory and other test data, was performed as part of comprehensive evaluation and provision of chronic care management services.   SDOH (Social Determinants of Health) assessments and interventions performed:    CCM Care Plan  Allergies  Allergen Reactions   Ibuprofen Other (See Comments)    Does not take due to hx of renal insufficiency "I have kidney disease"    Lemon Flavor Swelling    Severe Lip Swelling  FRUIT per pt.     Amoxicillin Diarrhea and Other (See Comments)   Tylenol [Acetaminophen] Hives    Cannot take large quantities    Outpatient Encounter Medications as of 10/04/2021  Medication Sig Note   acetaminophen (TYLENOL) 500 MG tablet Take 1 tablet (500 mg total) by mouth every 6 (six) hours as needed. (Patient taking differently: Take 500 mg by mouth every 6 (six) hours as needed for moderate pain.)    albuterol (VENTOLIN HFA) 108 (90 Base) MCG/ACT inhaler TAKE 2 PUFFS BY MOUTH EVERY 6 HOURS AS NEEDED FOR WHEEZE OR SHORTNESS OF BREATH (Patient taking differently: Inhale 2 puffs  into the lungs every 6 (six) hours as needed for wheezing or shortness of breath.)    Ascorbic Acid (VITAMIN C) 1000 MG tablet TAKE 1 TABLET BY MOUTH EVERY DAY    baclofen (LIORESAL) 20 MG tablet Take 1 tablet 3 times daily    Cholecalciferol (VITAMIN D3) 10 MCG (400 UNIT) tablet Take 400 Units by mouth daily.    Continuous Blood Gluc Sensor (DEXCOM G6 SENSOR) MISC 1 Device by Does not apply route as directed.    Continuous Blood Gluc Transmit (DEXCOM G6 TRANSMITTER) MISC 1 Device by Does not apply route as directed.    Cranberry-Vitamin C-Vitamin E 4200-20-3 MG-MG-UNIT CAPS Take 1 tablet by mouth daily.    cyanocobalamin (,VITAMIN B-12,) 1000 MCG/ML injection Inject 1,000 mcg into the muscle every 3 (three) months.    dapagliflozin propanediol (FARXIGA) 5 MG TABS tablet Take 1 tablet (5 mg total) by mouth daily before breakfast.    desloratadine (CLARINEX) 5 MG tablet TAKE 1 TABLET BY MOUTH  DAILY    diazepam (VALIUM) 10 MG tablet Take 2 tablets (20 mg total) by mouth in the morning, at noon, and at bedtime. Home med.    diclofenac Sodium (VOLTAREN) 1 % GEL Apply 2 g topically 4 (four) times daily. 09/08/2021: Hasn't started   doxycycline (VIBRAMYCIN) 100 MG capsule Take 1 capsule (100 mg total) by mouth 2 (two) times daily. 09/08/2021: ABT Start Date 09/05/21. 7 Day Course   EPINEPHRINE 0.3 mg/0.3 mL IJ SOAJ injection INJECT INTRAMUSCULARLY 1  PEN AS NEEDED FOR ALLERGIC  RESPONSE AS DIRECTED BY MD. Kirke Corin  MEDICAL HELP AFTER  USE.    famotidine (PEPCID) 20 MG tablet TAKE 1 TABLET BY MOUTH TWICE A DAY    FLUoxetine (PROZAC) 10 MG capsule Take 1 capsule (10 mg total) by mouth at bedtime.    gabapentin (NEURONTIN) 300 MG capsule TAKE 1 CAPSULE BY MOUTH TWO TIMES DAILY    Glucagon (GVOKE HYPOPEN 2-PACK) 0.5 MG/0.1ML SOAJ Inject 0.1 mLs into the skin as needed (if blood sugar less than 70).    glucose blood test strip Use as instructed to check sugars qac and qhs    hydrOXYzine (VISTARIL) 100 MG capsule  TAKE 1 CAPSULE BY MOUTH 3  TIMES DAILY AS NEEDED FOR  ITCHING (Patient taking differently: Take 100 mg by mouth 3 (three) times daily as needed for itching.)    insulin degludec (TRESIBA) 100 UNIT/ML FlexTouch Pen Inject 13 Units into the skin daily.    Insulin Lispro-aabc (LYUMJEV KWIKPEN) 100 UNIT/ML KwikPen Use as directed (Patient taking differently: Inject 3 Units into the skin in the morning, at noon, and at bedtime. Use as directed)    Insulin Pen Needle 32G X 4 MM MISC 1 each by Does not apply route 2 (two) times daily. Dx E11.9    Multiple Vitamin (MULTIVITAMIN WITH MINERALS) TABS tablet Take 1 tablet by mouth daily.    vitamin E 180 MG (400 UNITS) capsule Take 400 Units by mouth daily.     No facility-administered encounter medications on file as of 10/04/2021.    Patient Active Problem List   Diagnosis Date Noted   Malnutrition of moderate degree 09/09/2021   AMS (altered mental status) 09/09/2021   Acute encephalopathy 09/08/2021   Acute lower UTI 09/08/2021   Elevated LFTs 09/08/2021   Elevated lipase 09/08/2021   Hashimoto's disease 08/09/2021   Hypoglycemia 05/05/2021   AKI (acute kidney injury) ([place]) 05/05/2021   Anxiety    Chronic rhinitis 04/24/2019   Keloid 10/10/2018   Depression 01/02/2017   History of DVT (deep vein thrombosis) 01/02/2017   Chronic pain syndrome 01/02/2017   Cocaine abuse ([place]) 01/02/2017   Dysphagia    Adjustment insomnia    Distal radius fracture, right 08/16/2016   Postconcussion syndrome 12/30/2014   Tremor 03/11/2014   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   Hiatal hernia 03/21/2013   Anemia, iron deficiency 01/29/2013   Spasm of muscle 09/02/2012   Anemia 08/28/2012   Mild intermittent asthma 08/27/2012   Chronic back pain    Pernicious anemia 10/30/2011   DEGENERATIVE DISC DISEASE 08/18/2010   Asthma with bronchitis 07/28/2010   Recurrent urticaria 07/28/2010    Conditions to be addressed/monitored: DMI, Stiff-Person Syndrome,  Asthma with bronchitis, GERD, Depression, Hashimoto's disease  Care Plan : Diabetes Type 1 (Adult)  Updates made by Lynne Logan, RN since 10/04/2021 12:00 AM     Problem: Glycemic Management (Diabetes, Type 1)   Priority: High     Long-Range Goal: Glycemic Management Optimized   Start Date: 01/31/2021  Expected End Date: 01/31/2022  Recent Progress: On track  Priority: High  Note:   Objective:  Lab Results  Component Value Date   HGBA1C 12.5 (H) 09/08/2021   Lab Results  Component Value Date   CREATININE 0.89 09/11/2021   CREATININE 1.08 (H) 09/10/2021   CREATININE 1.05 (H) 09/09/2021   Lab Results  Component Value Date   EGFR 73 (L) 03/22/2017    Current Barriers:  Knowledge Deficits related to basic Diabetes pathophysiology and self care/management Knowledge Deficits related to medications used for  management of diabetes Difficulty obtaining or cannot afford medications Financial Constraints Limited Social Support Case Manager Clinical Goal(s):  Patient will demonstrate improved adherence to prescribed treatment plan for diabetes self care/management as evidenced by: daily monitoring and recording of CBG  adherence to ADA/ carb modified diet exercise 5 days/week adherence to prescribed medication regimen contacting provider for new or worsened symptoms or questions Interventions:  08/04/21 completed successful outbound call with patient  Collaboration with Minette Brine, [place] regarding development and update of comprehensive plan of care as evidenced by provider attestation and co-signature Inter-disciplinary care team collaboration (see longitudinal plan of care) Provided education to patient about basic DM disease process Review of patient status, including review of consultant's reports, relevant laboratory and other test results, and medications completed. Reviewed medications with patient and discussed importance of medication adherence Educated patient on dietary  and exercise recommendations; daily glycemic control FBS 80-130, <180 after meals;15'15' rule Determined patient is taking her medications exactly as prescribed, she is following her sliding scale regimen exactly as directed Discussed highest cbg running <130 with lowest at 47, patient symptomatic with cbg at 59 but followed hypoglycemic protocol with success  Advised patient, providing education and rationale, to check cbg daily before meals and at bedtime and record, calling the CCM team and or PCP for findings outside established parameters Reviewed scheduled/upcoming provider appointments including: next Endocrinology follow up appointment scheduled with Dr. Kelton Pillar for 10/24/21 $[id number]$ :40 AM Discussed plans with patient for ongoing care management follow up and provided patient with direct contact information for care management team Self-Care Activities Self administers oral medications as prescribed Self administers injectable DM medications (Tresiba, Farxiga, Humalog) as prescribed Attends all scheduled provider appointments Checks blood sugars as prescribed and utilize hyper and hypoglycemia protocol as needed Adheres to prescribed ADA/carb modified Patient Goals: - check blood sugar at prescribed times - check blood sugar before and after exercise - check blood sugar if I feel it is too high or too low - enter blood sugar readings and medication or insulin into daily log - take the blood sugar log to all doctor visits - take the blood sugar meter to all doctor visits - manage portion size - keep appointment with eye doctor - schedule appointment with eye doctor - check feet daily for cuts, sores or redness - do heel pump exercise 2 to 3 times each day - keep feet up while sitting - trim toenails straight across - wash and dry feet carefully every day - wear comfortable, cotton socks - wear comfortable, well-fitting shoes  Follow Up Plan: Telephone follow up appointment with  care management team member scheduled for: 11/11/21     Plan:Telephone follow up appointment with care management team member scheduled for:  11/11/21  Barb Merino, RN, BSN, CCM Care Management Coordinator [place] [facility]  Direct Phone: [phone]
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
## 2021-10-05 NOTE — Patient Instructions (Signed)
Visit Information  PATIENT GOALS:  Goals Addressed      Diabetes treatment optimized   On track    Timeframe:  Long-Range Goal Priority:  High Start Date: 01/31/21/                            Expected End Date:  01/31/22  Follow Up Date: 11/11/21  Continue to follow DM dietary and exercise recommendations Continue to monitor CBG's daily before meals and at bedtime Follow up with Endocrinology as directed Take all of your DM medications exactly as prescribed                           The patient verbalized understanding of instructions, educational materials, and care plan provided today and declined offer to receive copy of patient instructions, educational materials, and care plan.   Telephone follow up appointment with care management team member scheduled for: 11/11/21  Delsa Sale, RN, BSN, CCM Care Management Coordinator [facility] Care Management/[facility]  Direct Phone: [phone]

## 2021-10-10 ENCOUNTER — Ambulatory Visit: Payer: Medicare Other

## 2021-10-10 DIAGNOSIS — J45909 Unspecified asthma, uncomplicated: Secondary | ICD-10-CM

## 2021-10-10 DIAGNOSIS — E1021 Type 1 diabetes mellitus with diabetic nephropathy: Secondary | ICD-10-CM

## 2021-10-10 DIAGNOSIS — F32A Depression, unspecified: Secondary | ICD-10-CM

## 2021-10-10 DIAGNOSIS — G2582 Stiff-man syndrome: Secondary | ICD-10-CM
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
## 2021-10-12 ENCOUNTER — Telehealth: Payer: Self-pay

## 2021-10-12 ENCOUNTER — Ambulatory Visit: Payer: Medicare Other | Admitting: Dermatology

## 2021-10-12 ENCOUNTER — Telehealth: Payer: Self-pay | Admitting: Internal Medicine

## 2021-10-12 NOTE — Chronic Care Management (AMB) (Signed)
    Called [facility], No answer, left message of appointment on 10-13-2021 at 12:00 via telephone visit with Cherylin Mylar, Pharm D. Notified to have all medications, supplements, blood pressure and/or blood sugar logs available during appointment and to return call if need to reschedule.   Care Gaps: Shingrix overdue Covid vaccine overdue Yearly ophthalmology exam overdue AWV 08-17-2022  Star Rating Drug: Glipizide 5 mg- Last filled 07-27-2021 [facility] Metformin 500 mg- Last filled 07-19-2021 [facility] Farxiga 5 mg- Last filled 07-19-2021 [facility]  Any gaps in medications fill history? No  Huey Romans [facility] Clinical Pharmacist Assistant [phone]

## 2021-10-12 NOTE — Telephone Encounter (Signed)
Office notes faxed 

## 2021-10-12 NOTE — Telephone Encounter (Signed)
[facility] calling requesting most recent clinical notes faxed [phone]

## 2021-10-13 ENCOUNTER — Telehealth: Payer: Medicare Other

## 2021-10-17 ENCOUNTER — Other Ambulatory Visit: Payer: Self-pay | Admitting: Nurse Practitioner

## 2021-10-17 ENCOUNTER — Ambulatory Visit: Payer: Medicare Other | Admitting: Neurology

## 2021-10-17 DIAGNOSIS — E119 Type 2 diabetes mellitus without complications: Secondary | ICD-10-CM

## 2021-10-24 ENCOUNTER — Ambulatory Visit: Payer: Medicare Other | Admitting: Internal Medicine

## 2021-10-24 DIAGNOSIS — E1021 Type 1 diabetes mellitus with diabetic nephropathy: Secondary | ICD-10-CM | POA: Diagnosis not present

## 2021-10-24 DIAGNOSIS — J45909 Unspecified asthma, uncomplicated: Secondary | ICD-10-CM

## 2021-10-24 DIAGNOSIS — N1831 Chronic kidney disease, stage 3a: Secondary | ICD-10-CM

## 2021-10-24 DIAGNOSIS — F32A Depression, unspecified: Secondary | ICD-10-CM

## 2021-10-25 ENCOUNTER — Telehealth: Payer: Medicare Other

## 2021-10-25 ENCOUNTER — Telehealth: Payer: Self-pay

## 2021-10-25 NOTE — Telephone Encounter (Signed)
  Care Management   Follow Up Note   10/25/2021 Name: Sherri [name] MRN: [id number] DOB: 1966/05/28   Referred by: Arnette Felts, FNP Reason for referral : Chronic Care Management (RN CM Follow up call )   [name] unsuccessful telephone outreach was attempted today. The patient was referred to the case management team for assistance with care management and care coordination.   Follow Up Plan: A HIPPA compliant phone message was left for the patient providing contact information and requesting a return call.   Delsa Sale, RN, BSN, CCM Care Management Coordinator [facility] Care Management/[facility]  Direct Phone: [phone]

## 2021-11-04 ENCOUNTER — Other Ambulatory Visit: Payer: Self-pay | Admitting: Neurology

## 2021-11-08 ENCOUNTER — Telehealth: Payer: Self-pay

## 2021-11-08 ENCOUNTER — Telehealth: Payer: Self-pay | Admitting: *Deleted

## 2021-11-08 NOTE — Chronic Care Management (AMB) (Addendum)
Chronic Care Management Pharmacy Assistant   Name: Sherri [name]  MRN: [id number] DOB: [birth year]   Reason for Encounter: Disease State/Diabetes  Recent office visits:  10-10-2021 [name] (CCM)  10-05-2021 [name] (CCM)  10-04-2021 Little, [name], RN (CCM)  09-30-2021 Little, [name], RN (CCM)  09-30-2021 [name] (CCM)  09-22-2021 [name] (CCM)  Recent consult visits:  None  Hospital visits:  Medication Reconciliation was completed by comparing discharge summary, patient's EMR and Pharmacy list, and upon discussion with patient.  Admitted to the hospital on 09-08-2021 due to altered mental status. Discharge date was 09-11-2021 Discharged from [facility].    New?Medications Started at [facility] Discharge:?? Flagyl 500 mg twice daily for 6 days  Medication Changes at Hospital Discharge: None  Medications Discontinued at Hospital Discharge: None  Medications that remain the same after Hospital Discharge:??  -All other medications will remain the same.    Hospital visits:  Medication Reconciliation was completed by comparing discharge summary, patient's EMR and Pharmacy list, and upon discussion with patient.  Admitted to the hospital on 09-05-2021 due to right hand pain. Discharge date was 09-05-2021 Discharged from [facility] health urgent care [facility].    New?Medications Started at [facility] Discharge:?? Tylenol 500 mg every six hours as needed Voltaren gel apply 2 grams 4 times daily Doxycycline 100 mg twice daily  Medication Changes at Hospital Discharge: None  Medications Discontinued at Hospital Discharge: None  Medications that remain the same after Hospital Discharge:??  -All other medications will remain the same.    Medications: Outpatient Encounter Medications as of 11/08/2021  Medication Sig Note   acetaminophen (TYLENOL) 500 MG tablet Take 1 tablet (500 mg total) by mouth every 6 (six) hours as needed.  (Patient taking differently: Take 500 mg by mouth every 6 (six) hours as needed for moderate pain.)    albuterol (VENTOLIN HFA) 108 (90 Base) MCG/ACT inhaler TAKE 2 PUFFS BY MOUTH EVERY 6 HOURS AS NEEDED FOR WHEEZE OR SHORTNESS OF BREATH (Patient taking differently: Inhale 2 puffs into the lungs every 6 (six) hours as needed for wheezing or shortness of breath.)    Ascorbic Acid (VITAMIN C) 1000 MG tablet TAKE 1 TABLET BY MOUTH EVERY DAY    baclofen (LIORESAL) 20 MG tablet Take 1 tablet 3 times daily    Cholecalciferol (VITAMIN D3) 10 MCG (400 UNIT) tablet Take 400 Units by mouth daily.    Continuous Blood Gluc Sensor (DEXCOM G6 SENSOR) MISC 1 Device by Does not apply route as directed.    Continuous Blood Gluc Transmit (DEXCOM G6 TRANSMITTER) MISC 1 Device by Does not apply route as directed.    Cranberry-Vitamin C-Vitamin E 4200-20-3 MG-MG-UNIT CAPS Take 1 tablet by mouth daily.    cyanocobalamin (,VITAMIN B-12,) 1000 MCG/ML injection Inject 1,000 mcg into the muscle every 3 (three) months.    desloratadine (CLARINEX) 5 MG tablet TAKE 1 TABLET BY MOUTH  DAILY    diazepam (VALIUM) 10 MG tablet Take 2 tablets (20 mg total) by mouth in the morning, at noon, and at bedtime. Home med.    diclofenac Sodium (VOLTAREN) 1 % GEL Apply 2 g topically 4 (four) times daily. 09/08/2021: Hasn't started   doxycycline (VIBRAMYCIN) 100 MG capsule Take 1 capsule (100 mg total) by mouth 2 (two) times daily. 09/08/2021: ABT Start Date 09/05/21. 7 Day Course   EPINEPHRINE 0.3 mg/0.3 mL IJ SOAJ injection INJECT INTRAMUSCULARLY 1  PEN AS NEEDED FOR ALLERGIC  RESPONSE AS DIRECTED BY MD.  SEEK MEDICAL HELP AFTER  USE.    famotidine (PEPCID) 20 MG tablet TAKE 1 TABLET BY MOUTH TWICE A DAY    FARXIGA 5 MG TABS tablet TAKE 1 TABLET BY MOUTH DAILY BEFORE BREAKFAST.    FLUoxetine (PROZAC) 10 MG capsule Take 1 capsule (10 mg total) by mouth at bedtime.    gabapentin (NEURONTIN) 300 MG capsule TAKE 1 CAPSULE BY MOUTH TWO TIMES DAILY     Glucagon (GVOKE HYPOPEN 2-PACK) 0.5 MG/0.1ML SOAJ Inject 0.1 mLs into the skin as needed (if blood sugar less than 70).    glucose blood test strip Use as instructed to check sugars qac and qhs    hydrOXYzine (VISTARIL) 100 MG capsule TAKE 1 CAPSULE BY MOUTH 3  TIMES DAILY AS NEEDED FOR  ITCHING (Patient taking differently: Take 100 mg by mouth 3 (three) times daily as needed for itching.)    insulin degludec (TRESIBA) 100 UNIT/ML FlexTouch Pen Inject 13 Units into the skin daily.    Insulin Lispro-aabc (LYUMJEV KWIKPEN) 100 UNIT/ML KwikPen Use as directed (Patient taking differently: Inject 3 Units into the skin in the morning, at noon, and at bedtime. Use as directed)    Insulin Pen Needle 32G X 4 MM MISC 1 each by Does not apply route 2 (two) times daily. Dx E11.9    Multiple Vitamin (MULTIVITAMIN WITH MINERALS) TABS tablet Take 1 tablet by mouth daily.    vitamin E 180 MG (400 UNITS) capsule Take 400 Units by mouth daily.     No facility-administered encounter medications on file as of 11/08/2021.   Recent Relevant Labs: Lab Results  Component Value Date/Time   HGBA1C 12.5 (H) 09/08/2021 03:30 PM   HGBA1C 12.0 (A) 08/08/2021 10:08 AM   HGBA1C 13.0 (H) 05/06/2021 05:41 AM   MICROALBUR 30mg  03/22/2021 11:39 AM   MICROALBUR 30 06/04/2020 12:29 PM    Kidney Function Lab Results  Component Value Date/Time   CREATININE 0.89 09/11/2021 05:58 AM   CREATININE 1.08 (H) 09/10/2021 05:45 AM   CREATININE 1.19 (H) 08/09/2021 02:32 PM   CREATININE 0.99 11/16/2020 02:47 PM   CREATININE 1.0 11/23/2017 11:47 AM   CREATININE 1.0 07/23/2017 11:30 AM   CREATININE 1.0 03/22/2017 09:43 AM   CREATININE 1.2 (H) 08/24/2016 10:04 AM   GFR 55.40 (L) 08/08/2021 10:58 AM   GFRNONAA >60 09/11/2021 05:58 AM   GFRNONAA 54 (L) 08/09/2021 02:32 PM   GFRAA 59 (L) 01/25/2021 05:01 PM   GFRAA >60 07/27/2020 01:16 PM    Current antihyperglycemic regimen:  Farxiga 5 mg tablet daily  Tresiba 100 unit/ml - 13  units into the skin daily Insulin lispro-aabc: use as directed Glucagon inject 0.1 mls if blood sugar is less than 70  What recent interventions/DTPs have been made to improve glycemic control:  Educated on A1c and blood sugar goals; Complications of diabetes including kidney damage, retinal damage, and cardiovascular disease; Prevention and management of hypoglycemic episodes; -Counseled to check feet daily and get yearly eye exams  Have there been any recent hospitalizations or ED visits since last visit with CPP? No  Patient denies hypoglycemic symptoms  Patient denies hyperglycemic symptoms  How often are you checking your blood sugar? Patient states she doesn't check blood sugar often.  What are your blood sugars ranging?  Fasting: 237 about a week ago, 11-16 296 Before meals: None After meals: None Bedtime: None  During the week, how often does your blood glucose drop below 70? Never  Are you checking your feet  daily/regularly? Patient states daily  Adherence Review: Is the patient currently on a STATIN medication? No Is the patient currently on ACE/ARB medication? No Does the patient have >5 day gap between last estimated fill dates? No  NOTES: Patient was in a rush and will call back to schedule follow up with Cherylin Mylar CPP. Sent Arnette Felts, FNP and Marquette Old a message to schedule a lab visit for A1C before next year.  11-09-2021: Called patient to see if she was receiving patient assistance for farxiga and tresiba. Patient states she is getting medications at a low cost from [facility]. Patient states she has 3 pills of the farxiga left and will have to find someone to help her pay the $15 copay for a 90 DS. After speaking to patient on 11-08-2021 for assessment, patient stated she will call back to schedule appointment with Cherylin Mylar because she had somewhere to be. Patient called back late afternoon and I was unable to answer call. Patient called Cherylin Mylar and  left a voicemail stating she was feeling anxious and felt rushed off of the phone. Patient stated after getting off the phone with me on 11-08-2021 she was unable to leave the house due to being light headed. Patient stated she checked her blood sugar and it was 406. Patient stated she then took her insulin, laid down and sugar went down to 209. Patient states she is feeling much better today. Told patient to contact me if she has another episode. Made patient a follow up appointment with Cherylin Mylar CPP in January.  Care Gaps: Covid vaccine overdue Yearly ophthalmology exam overdue Shingrix overdue  Star Rating Drugs: Farxiga 5 mg- Last filled 10-17-2021 90 DS [facility]  Huey Romans [facility] Clinical Pharmacist Assistant [phone]

## 2021-11-08 NOTE — Chronic Care Management (AMB) (Signed)
  Care Management   Note  11/08/2021 Name: Sherri [name] MRN: [id number] DOB: 05-Jul-1966  Sherri [name] is a 55 y.o. year old female who is a primary care patient of Arnette Felts, FNP and is actively engaged with the care management team. I reached out to [name] by phone today to assist with re-scheduling a follow up visit with the RN Case Manager  Follow up plan: Unsuccessful telephone outreach attempt made. A HIPAA compliant phone message was left for the patient providing contact information and requesting a return call.  The care management team will reach out to the patient again over the next 7 days.  If patient returns call to provider office, please advise to call Embedded Care Management Care Guide Misty Stanley at [phone].  Gwenevere Ghazi  Care Guide, Embedded Care Coordination [facility] Management  Direct Dial: [phone]

## 2021-11-09 ENCOUNTER — Other Ambulatory Visit: Payer: Self-pay

## 2021-11-09 ENCOUNTER — Ambulatory Visit (INDEPENDENT_AMBULATORY_CARE_PROVIDER_SITE_OTHER): Payer: Medicare Other | Admitting: Internal Medicine

## 2021-11-09 ENCOUNTER — Encounter: Payer: Self-pay | Admitting: Internal Medicine

## 2021-11-09 VITALS — BP 120/70 | HR 94 | Ht 63.0 in | Wt 107.0 lb

## 2021-11-09 DIAGNOSIS — E1065 Type 1 diabetes mellitus with hyperglycemia: Secondary | ICD-10-CM

## 2021-11-09 DIAGNOSIS — E063 Autoimmune thyroiditis: Secondary | ICD-10-CM | POA: Diagnosis not present

## 2021-11-09 DIAGNOSIS — E1021 Type 1 diabetes mellitus with diabetic nephropathy: Secondary | ICD-10-CM

## 2021-11-09 LAB — POCT GLYCOSYLATED HEMOGLOBIN (HGB A1C): Hemoglobin A1C: 13.7 % — AB (ref 4.0–5.6)

## 2021-11-09 MED ORDER — NOVOLOG FLEXPEN 100 UNIT/ML ~~LOC~~ SOPN: PEN_INJECTOR | SUBCUTANEOUS | 11 refills | Status: DC

## 2021-11-09 NOTE — Progress Notes (Signed)
Name: Sherri [name]  MRN/ DOB: [id number], Apr 27, 1966   Age/ Sex: 55 y.o., female    PCP: Arnette Felts, FNP   Reason for Endocrinology Evaluation: Type 2 Diabetes Mellitus     Date of Initial Endocrinology Visit: 08/08/2021    PATIENT IDENTIFIER: Sherri [name] is a 55 y.o. female with a past medical history of T2DM, CKD, hx of coccaine abuse, depression, DVT . The patient presented for initial endocrinology clinic visit on 08/08/2021 for consultative assistance with her diabetes management.     DIABETIC HISTORY:  [name] was diagnosed with T2DM in 2021, she was initially started on oral glycemic agents, but subsequently required insulin due to persistent hyperglycemia.Her hemoglobin A1c has ranged from  6.4% in 2021, peaking at 13.0% in 2022.  On her initial visit to our clinic she had an A1c of 12%, she was on Farxiga, metformin, glipizide, Tresiba, and metformin.  We stopped metformin and glipizide, continued [place] and Farxiga, and started prandial insulin  Her diagnosis was changed from type 2 DM to type I DM based on elevated islet cell antibody titers at 5120 and GAD-65 > 250   Was diagnosed with stiff person syndrome in 2021, follows with Dr. Anne Hahn She was also found to have en elevated Anti-TPO Ab's at 37 IU/Ml ( reference 0-34)      SUBJECTIVE:   During the last visit (08/08/2021): A1c 12%, started prandial insulin, continued basal insulin and Farxiga, stopped metformin and glipizide  Today (08/08/2021): [name] is here for follow-up on diabetes management.  She checks her blood sugars occasionally . The patient has not had hypoglycemic episodes since the last clinic visit.  She had candy, donuts , banana and cookie.   She feels her legs are heavy    HOME DIABETES REGIMEN: Farxiga 10 mg daily  Tresiba 13 units daily  Lyumjev  3 units with each meal- has been taking it after the meal     Statin: no ACE-I/ARB: no    METER DOWNLOAD SUMMARY: No  data available   DIABETIC COMPLICATIONS: Microvascular complications:  CKD III, neuropathy Denies: retinopathy Last eye exam: Completed > 2 yrs ago   Macrovascular complications:   Denies: CAD, PVD, CVA   PAST HISTORY: Past Medical History:  Past Medical History:  Diagnosis Date   Angioedema    Anxiety    Arthritis    Asthma    Benzodiazepine withdrawal (HCC) 08/30/2012   CAP (community acquired pneumonia) 08/29/2012   Chronic back pain    Chronic kidney disease    Closed nondisplaced fracture of proximal phalanx of right little finger 10/18/2018   Cocaine abuse (HCC)    Depression    Diarrhea    DVT (deep venous thrombosis) (HCC)    Environmental allergies    Fall    GERD (gastroesophageal reflux disease)    Heart murmur    has been told once that she has a heart murmur, but has never had any problems   Hiatal hernia 03/21/2013   Lumbar herniated disc    Peripheral vascular disease (HCC)    Pernicious anemia 10/30/2011   Postconcussion syndrome 12/30/2014   Rhabdomyolysis 08/27/2012   Seasonal allergies    Stiff person syndrome    Urticaria    Past Surgical History:  Past Surgical History:  Procedure Laterality Date   BUNIONECTOMY Left    colonoscopy  07/09/15   inguinal hernia 1983  1983   OPEN REDUCTION INTERNAL FIXATION (ORIF) DISTAL RADIAL FRACTURE Right 08/16/2016  Procedure: OPEN REDUCTION INTERNAL FIXATION (ORIF) DISTAL RADIAL FRACTURE;  Surgeon: Eldred Manges, MD;  Location: [facility] OR;  Service: Orthopedics;  Laterality: Right;    Social History:  reports that she has never smoked. She has never used smokeless tobacco. She reports that she does not currently use alcohol. She reports that she does not currently use drugs after having used the following drugs: Cocaine. Family History:  Family History  Problem Relation Age of Onset   Cancer Mother    Other Mother    COPD Father    Asthma Brother    Cancer Brother        colon   Heart attack Brother    Seizures  Brother    Allergic rhinitis Neg Hx    Angioedema Neg Hx    Eczema Neg Hx    Immunodeficiency Neg Hx    Urticaria Neg Hx      HOME MEDICATIONS: Allergies as of 11/09/2021       Reactions   Ibuprofen Other (See Comments)   Does not take due to hx of renal insufficiency "I have kidney disease"    Lemon Flavor Swelling   Severe Lip Swelling  FRUIT per pt.     Amoxicillin Diarrhea, Other (See Comments)   Tylenol [acetaminophen] Hives   Cannot take large quantities        Medication List        Accurate as of November 09, 2021 12:43 PM. If you have any questions, ask your nurse or doctor.          acetaminophen 500 MG tablet Commonly known as: TYLENOL Take 1 tablet (500 mg total) by mouth every 6 (six) hours as needed. What changed: reasons to take this   albuterol 108 (90 Base) MCG/ACT inhaler Commonly known as: VENTOLIN HFA TAKE 2 PUFFS BY MOUTH EVERY 6 HOURS AS NEEDED FOR WHEEZE OR SHORTNESS OF BREATH What changed: See the new instructions.   baclofen 20 MG tablet Commonly known as: LIORESAL Take 1 tablet 3 times daily   Cranberry-Vitamin C-Vitamin E 4200-20-3 MG-MG-UNIT Caps Take 1 tablet by mouth daily.   cyanocobalamin 1000 MCG/ML injection Commonly known as: (VITAMIN B-12) Inject 1,000 mcg into the muscle every 3 (three) months.   desloratadine 5 MG tablet Commonly known as: CLARINEX TAKE 1 TABLET BY MOUTH  DAILY   Dexcom G6 Sensor Misc 1 Device by Does not apply route as directed.   Dexcom G6 Transmitter Misc 1 Device by Does not apply route as directed.   diazepam 10 MG tablet Commonly known as: VALIUM Take 2 tablets (20 mg total) by mouth in the morning, at noon, and at bedtime. Home med.   diclofenac Sodium 1 % Gel Commonly known as: VOLTAREN Apply 2 g topically 4 (four) times daily.   doxycycline 100 MG capsule Commonly known as: VIBRAMYCIN Take 1 capsule (100 mg total) by mouth 2 (two) times daily.   EPINEPHrine 0.3 mg/0.3 mL Soaj  injection Commonly known as: EPI-PEN INJECT INTRAMUSCULARLY 1  PEN AS NEEDED FOR ALLERGIC  RESPONSE AS DIRECTED BY MD. SEEK MEDICAL HELP AFTER  USE.   famotidine 20 MG tablet Commonly known as: PEPCID TAKE 1 TABLET BY MOUTH TWICE A DAY   Farxiga 5 MG Tabs tablet Generic drug: dapagliflozin propanediol TAKE 1 TABLET BY MOUTH DAILY BEFORE BREAKFAST.   FLUoxetine 10 MG capsule Commonly known as: PROZAC Take 1 capsule (10 mg total) by mouth at bedtime.   gabapentin 300 MG capsule Commonly known as:  NEURONTIN TAKE 1 CAPSULE BY MOUTH  TWICE DAILY   glucose blood test strip Use as instructed to check sugars qac and qhs   Gvoke HypoPen 2-Pack 0.5 MG/0.1ML Soaj Generic drug: Glucagon Inject 0.1 mLs into the skin as needed (if blood sugar less than 70).   hydrOXYzine 100 MG capsule Commonly known as: VISTARIL TAKE 1 CAPSULE BY MOUTH 3  TIMES DAILY AS NEEDED FOR  ITCHING What changed:  how much to take how to take this when to take this reasons to take this additional instructions   insulin degludec 100 UNIT/ML FlexTouch Pen Commonly known as: TRESIBA Inject 13 Units into the skin daily.   Insulin Pen Needle 32G X 4 MM Misc 1 each by Does not apply route 2 (two) times daily. Dx E11.9   Lyumjev KwikPen 100 UNIT/ML KwikPen Generic drug: Insulin Lispro-aabc Use as directed What changed:  how much to take how to take this when to take this   multivitamin with minerals Tabs tablet Take 1 tablet by mouth daily.   vitamin C 1000 MG tablet TAKE 1 TABLET BY MOUTH EVERY DAY   Vitamin D3 10 MCG (400 UNIT) tablet Take 400 Units by mouth daily.   vitamin E 180 MG (400 UNITS) capsule Take 400 Units by mouth daily.         ALLERGIES: Allergies  Allergen Reactions   Ibuprofen Other (See Comments)    Does not take due to hx of renal insufficiency "I have kidney disease"    Lemon Flavor Swelling    Severe Lip Swelling  FRUIT per pt.     Amoxicillin Diarrhea and Other  (See Comments)   Tylenol [Acetaminophen] Hives    Cannot take large quantities     REVIEW OF SYSTEMS: A comprehensive ROS was conducted with the patient and is negative except as per HPI    OBJECTIVE:   VITAL SIGNS: BP 120/70 (BP Location: Left Arm, Patient Position: Sitting, Cuff Size: Small)   Pulse 94   Ht 5\' 3"  (1.6 m)   Wt 107 lb (48.5 kg)   LMP 07/29/2012 (Approximate)   SpO2 97%   BMI 18.95 kg/m    PHYSICAL EXAM:  General: Pt appears well and is in NAD  Lungs: Clear with good BS bilat with no rales, rhonchi, or wheezes  Heart: RRR with normal S1 and S2 and no gallops; no murmurs; no rub  Abdomen: Normoactive bowel sounds, soft, nontender, without masses or organomegaly palpable  Extremities:  Lower extremities - No pretibial edema. No lesions.  Neuro: MS is good with appropriate affect, pt is alert and Ox3    DATA REVIEWED:  Lab Results  Component Value Date   HGBA1C 12.5 (H) 09/08/2021   HGBA1C 12.0 (A) 08/08/2021   HGBA1C 13.0 (H) 05/06/2021   Lab Results  Component Value Date   MICROALBUR 30mg  03/22/2021   LDLCALC 83 06/03/2020   CREATININE 0.89 09/11/2021   Lab Results  Component Value Date   MICRALBCREAT 30-300mg /g 03/22/2021    Lab Results  Component Value Date   CHOL 172 06/03/2020   HDL 75 06/03/2020   LDLCALC 83 06/03/2020   TRIG 76 06/03/2020   CHOLHDL 2.3 06/03/2020       Results for MADDALYN, LUTZE (MRN 08/03/2020) as of 11/09/2021 12:49  Ref. Range 08/08/2021 10:58  ISLET CELL ANTIBODY SCREEN Latest Ref Range: NEGATIVE  POSITIVE (A)   Glutamic Acid Decarb Ab <5 IU/mL >250 High     ASSESSMENT / PLAN / RECOMMENDATIONS:  1) Type 1 Diabetes Mellitus, Poorly controlled, With CKD III and neuropathic  complications - Most recent A1c of 13.7 %. Goal A1c < 7.0 %.    -Pt continues with hyperglycemia this is due to dietary indiscretions and I suspect medication nonadherence - Pt with multiple social determinants - Dexcom  is cost  prohibitive for her $28  -Medications are cost prohibitive as well, she is asking for Lyumjev samples , I will for the patient patient assistance papers -We again emphasized the importance of avoiding sugar sweetened beverages, limiting amount of carbohydrates, and using prandial insulin with meals -Given that she has type I DM, will stop [place] -I have encouraged her to check glucose before meals so she can use the correction scale  MEDICATIONS:   -Stop Farxiga 10 mg daily  -Continue Tresiba 13units daily  - Humalog 6 units with each meal -Correction factor : Humalog (BG -130/50)  EDUCATION / INSTRUCTIONS: BG monitoring instructions: Patient is instructed to check her blood sugars 3 times a day, before meals. Call [facility] if: BG persistently < 70  I reviewed the Rule of 15 for the treatment of hypoglycemia in detail with the patient. Literature supplied.   2) Diabetic complications:  Eye: Does not have known diabetic retinopathy.  Neuro/ Feet: Does  have known diabetic peripheral neuropathy. Renal: Patient does  have known baseline CKD. She is not on an ACEI/ARB at present.   3) Hashimoto's Disease:   - TFT's are normal.  -Patient is clinically euthyroid  F/U in 4 months      Signed electronically by: Lyndle Herrlich, MD  [facility] Endocrinology  [facility] [address] [place], [place] [address] Phone: [phone] FAX: [phone]   CC: Arnette Felts, FNP [address] [place] [address] Phone: [phone]  Fax: [phone]    Return to Endocrinology clinic as below: Future Appointments  Date Time Provider Department Center  11/09/2021  2:40 PM Chay Mazzoni, [name], MD [facility] None  11/10/2021 11:30 AM [facility]CM SOCIAL WORK [facility] None  12/05/2021  9:15 AM Deirdre Evener, MD [facility] None  12/28/2021  3:00 PM Arnette Felts, FNP [facility] None  12/29/2021  2:00 PM Levert Feinstein, MD [facility] None  01/03/2022  1:45 PM Glean Salvo, NP [facility] None  02/10/2022  1:45 PM [facility] None  02/10/2022  2:15 PM [name], NP [facility] None  08/17/2022  9:00 AM [facility] None  08/17/2022 10:00 AM Arnette Felts, FNP [facility] None

## 2021-11-09 NOTE — Patient Instructions (Signed)
-   STOP Marcelline Deist  - Tresiba 13 units daily  - Humalog/ Lyumjev 6 units with each meal  - Humalog/Lyumjev correctional insulin: ADD extra units on insulin to your meal-time Humalog/Alyumjev dose if your blood sugars are higher than 180. Use the scale below to help guide you:   Blood sugar before meal Number of units to inject  Less than 180 0 unit  181 -  230 1 units  231 -  280 2 units  281 -  330 3 units  331 -  380 4 units  381 -  430 5 units  430 -  480 6 units  481 -  530 7 units  531 -  580 8 units        HOW TO TREAT LOW BLOOD SUGARS (Blood sugar LESS THAN 70 MG/DL) Please follow the RULE OF 15 for the treatment of hypoglycemia treatment (when your (blood sugars are less than 70 mg/dL)   STEP 1: Take 15 grams of carbohydrates when your blood sugar is low, which includes:  3-4 GLUCOSE TABS  OR 3-4 OZ OF JUICE OR REGULAR SODA OR ONE TUBE OF GLUCOSE GEL    STEP 2: RECHECK blood sugar in 15 MINUTES STEP 3: If your blood sugar is still low at the 15 minute recheck --> then, go back to STEP 1 and treat AGAIN with another 15 grams of carbohydrates.

## 2021-11-10 ENCOUNTER — Encounter: Payer: Self-pay | Admitting: Dietician

## 2021-11-10 ENCOUNTER — Ambulatory Visit (INDEPENDENT_AMBULATORY_CARE_PROVIDER_SITE_OTHER): Payer: Medicare Other

## 2021-11-10 ENCOUNTER — Encounter: Payer: Medicare Other | Attending: Internal Medicine | Admitting: Dietician

## 2021-11-10 DIAGNOSIS — G2582 Stiff-man syndrome: Secondary | ICD-10-CM

## 2021-11-10 DIAGNOSIS — E1021 Type 1 diabetes mellitus with diabetic nephropathy: Secondary | ICD-10-CM

## 2021-11-10 DIAGNOSIS — E1069 Type 1 diabetes mellitus with other specified complication: Secondary | ICD-10-CM | POA: Diagnosis not present

## 2021-11-10 DIAGNOSIS — F32A Depression, unspecified: Secondary | ICD-10-CM

## 2021-11-10 DIAGNOSIS — N1831 Chronic kidney disease, stage 3a: Secondary | ICD-10-CM
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
## 2021-11-10 NOTE — Patient Instructions (Addendum)
Plan:  Aim for 3-4 Carb Choices per meal (45-60 grams) +/- 1 either way  Aim for 0-1 Carbs per snack if hungry  Include protein in moderation with your meals and snacks Consider reading food labels for Total Carbohydrate of foods Consider  increasing your activity level by walking or gym for at least 30 minutes daily as tolerated Continue checking BG at alternate times per day  Continue taking medication as directed by MD
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
## 2021-11-10 NOTE — Progress Notes (Signed)
Diabetes Self-Management Education  Visit Type: First/Initial  Appt. Start Time: 1415 Appt. End Time: 1630  11/10/2021  [name], identified by name and date of birth, is a 55 y.o. female with a diagnosis of Diabetes: Type 1.   ASSESSMENT Patient is here today alone.  She was last seen by an RD 01/21/2018 at [place]. She would like to learn better eating habits to better manage her blood glucose.  History includes:  Type 1 diabetes with neuropathy, depression, stiff person syndrome, CKD, history of cocaine abuse, depression, hashimoto's disease, pernicious anemia Medications include:  Trisiba 13 units q am, Lyumjev 6 units plus sliding scale before meals, vitamin B-12, Cranberri/vitamin C/vitamin E, Vitamin D, MVI No Dexcom as this is too expensive. A1C 13.7% 11/07/2021 increased from 12.5% 09/08/2021  Weight hx: 63" 108 lbs 11/10/2021 105 lbs 9/16/202 175 lbs about 10 years ago.  Purposefully lost weight due to diabetes diagnosis.    Patient walks with a cane. She speaks to a Education officer, museum once a week related to her diabetes. She is attending [facility] [employer] out patient. She is currently in school, child development and psychology. Gym at her apartment complex.  Weight 108 lb (49 kg), last menstrual period 07/29/2012. Body mass index is 19.13 kg/m.   Diabetes Self-Management Education - 11/10/21 1452       Visit Information   Visit Type First/Initial      Initial Visit   Diabetes Type Type 1    Are you currently following a meal plan? No    Are you taking your medications as prescribed? Yes    Date Diagnosed 2019      Health Coping   How would you rate your overall health? Good      Psychosocial Assessment   Patient Belief/Attitude about Diabetes Motivated to manage diabetes    Self-care barriers None;Lack of material resources    Self-management support Doctor's office    Other persons present Patient    Patient Concerns Nutrition/Meal planning     Special Needs None    Preferred Learning Style No preference indicated    Learning Readiness Ready    How often do you need to have someone help you when you read instructions, pamphlets, or other written materials from your doctor or pharmacy? 1 - Never    What is the last grade level you completed in school? some college      Pre-Education Assessment   Patient understands the diabetes disease and treatment process. Needs Review    Patient understands incorporating nutritional management into lifestyle. Needs Review    Patient undertands incorporating physical activity into lifestyle. Needs Review    Patient understands using medications safely. Needs Review    Patient understands monitoring blood glucose, interpreting and using results Needs Review    Patient understands prevention, detection, and treatment of acute complications. Demonstrates understanding / competency    Patient understands prevention, detection, and treatment of chronic complications. Demonstrates understanding / competency    Patient understands how to develop strategies to address psychosocial issues. Needs Review    Patient understands how to develop strategies to promote health/change behavior. Needs Review      Complications   Last HgB A1C per patient/outside source 13.7 %   11/07/2021 increased from 12.5% 09/08/2021   How often do you check your blood sugar? 3-4 times/day    Fasting Blood glucose range (mg/dL) >200;70-129;130-179;180-200    Postprandial Blood glucose range (mg/dL) 70-129    Number of hypoglycemic  episodes per month 8    Can you tell when your blood sugar is low? Yes    What do you do if your blood sugar is low? eats 1/2 sandwich    Number of hyperglycemic episodes per week 7    Have you had a dilated eye exam in the past 12 months? No    Have you had a dental exam in the past 12 months? Yes    Are you checking your feet? Yes    How many days per week are you checking your feet? 7      Dietary  Intake   Breakfast 1/2 sweetened and 1/2 unsweetend instant oatmeal, egg, low carb juice    Lunch milk shake, pimento cheese on [employer] bread, sugar free jello    Snack (afternoon) chps    Dinner [employer]'s fish sandwich and freis OR chicken breast in bacon, brussel sprouts, 1/2 potato OR LS spam sandwich on [employer]    Snack (evening) none    Beverage(s) water, low sugar v-8 splash, zero gatorade, flavored water      Exercise   Exercise Type Light (walking / raking leaves)   exercises from PT   How many days per week to you exercise? 3    How many minutes per day do you exercise? 60    Total minutes per week of exercise 180      Patient Education   Previous Diabetes Education Yes (please comment)   2019   Disease state  Other (comment)   review of type 1 diabetes   Nutrition management  Role of diet in the treatment of diabetes and the relationship between the three main macronutrients and blood glucose level;Food label reading, portion sizes and measuring food.;Meal options for control of blood glucose level and chronic complications.;Meal timing in regards to the patients' current diabetes medication.;Carbohydrate counting    Physical activity and exercise  Role of exercise on diabetes management, blood pressure control and cardiac health.    Medications Reviewed patients medication for diabetes, action, purpose, timing of dose and side effects.    Monitoring Purpose and frequency of SMBG.      Individualized Goals (developed by patient)   Nutrition Follow meal plan discussed    Physical Activity Exercise 3-5 times per week;60 minutes per day    Medications take my medication as prescribed    Monitoring  test my blood glucose as discussed    Reducing Risk examine blood glucose patterns;increase portions of healthy fats    Health Coping discuss diabetes with (comment)      Post-Education Assessment   Patient understands the diabetes disease and treatment process. Demonstrates understanding /  competency    Patient understands incorporating nutritional management into lifestyle. Needs Review    Patient undertands incorporating physical activity into lifestyle. Demonstrates understanding / competency    Patient understands using medications safely. Needs Review    Patient understands monitoring blood glucose, interpreting and using results Needs Review    Patient understands prevention, detection, and treatment of acute complications. Needs Review    Patient understands prevention, detection, and treatment of chronic complications. Demonstrates understanding / competency    Patient understands how to develop strategies to address psychosocial issues. Demonstrates understanding / competency    Patient understands how to develop strategies to promote health/change behavior. Needs Review      Outcomes   Expected Outcomes Demonstrated interest in learning. Expect positive outcomes    Future DMSE 2 months    Program Status  Not Completed             Individualized Plan for Diabetes Self-Management Training:   Learning Objective:  Patient will have a greater understanding of diabetes self-management. Patient education plan is to attend individual and/or group sessions per assessed needs and concerns.   Plan:   Patient Instructions  Plan:  Aim for 3-4 Carb Choices per meal (45-60 grams) +/- 1 either way  Aim for 0-1 Carbs per snack if hungry  Include protein in moderation with your meals and snacks Consider reading food labels for Total Carbohydrate of foods Consider  increasing your activity level by walking or gym for at least 30 minutes daily as tolerated Continue checking BG at alternate times per day  Continue taking medication as directed by MD    Expected Outcomes:  Demonstrated interest in learning. Expect positive outcomes  Education material provided: Food label handouts, Meal plan card, and My Plate  If problems or questions, patient to contact team via:   Phone  Future DSME appointment: 2 months

## 2021-11-11 ENCOUNTER — Telehealth: Payer: Medicare Other

## 2021-11-11 DIAGNOSIS — E1065 Type 1 diabetes mellitus with hyperglycemia: Secondary | ICD-10-CM | POA: Insufficient documentation

## 2021-11-14 ENCOUNTER — Telehealth: Payer: Self-pay | Admitting: Internal Medicine

## 2021-11-14 NOTE — Telephone Encounter (Signed)
Forms placed on provider's desk to complete.

## 2021-11-14 NOTE — Telephone Encounter (Signed)
Patient brought forms by for Lesly Dukes, M.D. to complete.  (Forms are in her bin).

## 2021-11-14 NOTE — Chronic Care Management (AMB) (Signed)
  Care Management   Note  11/14/2021 Name: Sherri [name] MRN: [id number] DOB: January 27, 1966  Sherri [name] is a 55 y.o. year old female who is a primary care patient of Arnette Felts, FNP and is actively engaged with the care management team. I reached out to [name] by phone today to assist with re-scheduling a follow up visit with the RN Case Manager  Follow up plan: Unsuccessful telephone outreach attempt made. A HIPAA compliant phone message was left for the patient providing contact information and requesting a return call.  The care management team will reach out to the patient again over the next 7 days.  If patient returns call to provider office, please advise to call Embedded Care Management Care Guide Misty Stanley at [phone].  Gwenevere Ghazi  Care Guide, Embedded Care Coordination [facility] Management  Direct Dial: [phone]

## 2021-11-15 NOTE — Chronic Care Management (AMB) (Signed)
  Care Management   Note  11/15/2021 Name: Sherri [name] MRN: [id number] DOB: 05/20/1966  Sherri [name] is a 55 y.o. year old female who is a primary care patient of Arnette Felts, FNP and is actively engaged with the care management team. I reached out to [name] by phone today to assist with re-scheduling a follow up visit with the RN Case Manager  Follow up plan: Unsuccessful telephone outreach attempt made. A HIPAA compliant phone message was left for the patient providing contact information and requesting a return call.  The care management team will reach out to the patient again over the next 7 days.  If patient returns call to provider office, please advise to call Embedded Care Management Care Guide Misty Stanley at [phone]  [facility] Guide, Embedded Care Coordination [facility]  Care Management  Direct Dial: [phone]

## 2021-11-16 NOTE — Chronic Care Management (AMB) (Signed)
  Care Management   Note  11/16/2021 Name: [name] MRN: [id number] DOB: 01/31/1966  Sherri [name] is a 55 y.o. year old female who is a primary care patient of Arnette Felts, FNP and is actively engaged with the care management team. I reached out to [name] by phone today to assist with re-scheduling a follow up visit with the RN Case Manager  Follow up plan: Telephone appointment with care management team member scheduled for:01/02/22  [facility] Guide, Embedded Care Coordination [facility]  Care Management  Direct Dial: [phone]
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
## 2021-11-25 ENCOUNTER — Ambulatory Visit (HOSPITAL_COMMUNITY): Payer: Medicare Other

## 2021-11-29 ENCOUNTER — Encounter: Payer: Self-pay | Admitting: Emergency Medicine

## 2021-11-29 ENCOUNTER — Ambulatory Visit
Admission: EM | Admit: 2021-11-29 | Discharge: 2021-11-29 | Disposition: A | Discharge status: Home / Self Care | Payer: Medicare Other | Attending: Family Medicine | Admitting: Family Medicine

## 2021-11-29 DIAGNOSIS — J209 Acute bronchitis, unspecified: Secondary | ICD-10-CM

## 2021-11-29 DIAGNOSIS — Z1152 Encounter for screening for COVID-19: Secondary | ICD-10-CM

## 2021-11-29 MED ORDER — FLUTICASONE PROPIONATE 50 MCG/ACT NA SUSP: 2.0000 | Freq: Every day | NASAL | 0 refills | Status: DC

## 2021-11-29 MED ORDER — AZITHROMYCIN 250 MG PO TABS: ORAL_TABLET | ORAL | 0 refills | Status: DC

## 2021-11-29 NOTE — Discharge Instructions (Signed)
COVID/Flu test pending. Symptom management warranted only.   Start azithromycin for treatment bronchitis infection. Manage fever with Tylenol and ibuprofen.  Nasal symptoms with over-the-counter antihistamines recommended.  Treatment per discharge medications/discharge instructions.  Red flags/ER precautions given. The most current CDC isolation/quarantine recommendation advised.

## 2021-11-29 NOTE — ED Provider Notes (Signed)
Sherri [name]    CSN: [id number] Arrival date & time: 11/29/21  9470      History   Chief Complaint Chief Complaint  Patient presents with   Nasal Congestion   Cough    HPI Sherri [name] is a 55 y.o. female.   HPI Patient presents today with URI symptoms of nasal congestion, productive cough, and exacerbation of chronic asthma symptoms.  Symptoms have been present for total 6 days.  She denies any associated fever, body aches, headache.  She has a low-grade temperature on arrival to [facility].  Patient has a history of type 1 diabetes.  Past Medical History:  Diagnosis Date   Angioedema    Anxiety    Arthritis    Asthma    Benzodiazepine withdrawal (HCC) 08/30/2012   CAP (community acquired pneumonia) 08/29/2012   Chronic back pain    Chronic kidney disease    Closed nondisplaced fracture of proximal phalanx of right little finger 10/18/2018   Cocaine abuse (HCC)    Depression    Diarrhea    DVT (deep venous thrombosis) (HCC)    Environmental allergies    Fall    GERD (gastroesophageal reflux disease)    Heart murmur    has been told once that she has a heart murmur, but has never had any problems   Hiatal hernia 03/21/2013   Lumbar herniated disc    Peripheral vascular disease (HCC)    Pernicious anemia 10/30/2011   Postconcussion syndrome 12/30/2014   Rhabdomyolysis 08/27/2012   Seasonal allergies    Stiff person syndrome    Urticaria     Patient Active Problem List   Diagnosis Date Noted   Type 1 diabetes mellitus with hyperglycemia (HCC) 11/11/2021   Malnutrition of moderate degree 09/09/2021   AMS (altered mental status) 09/09/2021   Acute encephalopathy 09/08/2021   Acute lower UTI 09/08/2021   Elevated LFTs 09/08/2021   Elevated lipase 09/08/2021   Hashimoto's disease 08/09/2021   Hypoglycemia 05/05/2021   AKI (acute kidney injury) (HCC) 05/05/2021   Anxiety    Chronic rhinitis 04/24/2019   Keloid 10/10/2018   Depression 01/02/2017   History of  DVT (deep vein thrombosis) 01/02/2017   Chronic pain syndrome 01/02/2017   Cocaine abuse (HCC) 01/02/2017   Dysphagia    Adjustment insomnia    Distal radius fracture, right 08/16/2016   Postconcussion syndrome 12/30/2014   Tremor 03/11/2014   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   Hiatal hernia 03/21/2013   Anemia, iron deficiency 01/29/2013   Spasm of muscle 09/02/2012   Anemia 08/28/2012   Mild intermittent asthma 08/27/2012   Chronic back pain    Pernicious anemia 10/30/2011   DEGENERATIVE DISC DISEASE 08/18/2010   Asthma with bronchitis 07/28/2010   Recurrent urticaria 07/28/2010    Past Surgical History:  Procedure Laterality Date   BUNIONECTOMY Left    colonoscopy  07/09/15   inguinal hernia 1983  1983   OPEN REDUCTION INTERNAL FIXATION (ORIF) DISTAL RADIAL FRACTURE Right 08/16/2016   Procedure: OPEN REDUCTION INTERNAL FIXATION (ORIF) DISTAL RADIAL FRACTURE;  Surgeon: Eldred Manges, MD;  Location: [facility] OR;  Service: Orthopedics;  Laterality: Right;    OB History     Gravida  1   Para  1   Term  1   Preterm  0   AB  0   Living  1      SAB      IAB      Ectopic  Multiple      Live Births               Home Medications    Prior to Admission medications   Medication Sig Start Date End Date Taking? Authorizing Provider  acetaminophen (TYLENOL) 500 MG tablet Take 1 tablet (500 mg total) by mouth every 6 (six) hours as needed. 09/05/21  Yes White, Adrienne R, NP  albuterol (VENTOLIN HFA) 108 (90 Base) MCG/ACT inhaler TAKE 2 PUFFS BY MOUTH EVERY 6 HOURS AS NEEDED FOR WHEEZE OR SHORTNESS OF BREATH Patient taking differently: Inhale 2 puffs into the lungs every 6 (six) hours as needed for wheezing or shortness of breath. 03/25/21  Yes Minette Brine, FNP  Ascorbic Acid (VITAMIN C) 1000 MG tablet TAKE 1 TABLET BY MOUTH EVERY DAY 09/28/21  Yes Minette Brine, FNP  azithromycin (ZITHROMAX) 250 MG tablet Take 2 tabs PO x 1 dose, then 1 tab PO QD x 4 days 11/29/21   Yes Scot Jun, FNP  Cholecalciferol (VITAMIN D3) 10 MCG (400 UNIT) tablet Take 400 Units by mouth daily.   Yes [provider]  Cranberry-Vitamin C-Vitamin E 4200-20-3 MG-MG-UNIT CAPS Take 1 tablet by mouth daily.   Yes [provider]  cyanocobalamin (,VITAMIN B-12,) 1000 MCG/ML injection Inject 1,000 mcg into the muscle every 3 (three) months.   Yes [provider]  desloratadine (CLARINEX) 5 MG tablet TAKE 1 TABLET BY MOUTH  DAILY 07/11/21  Yes Minette Brine, FNP  diazepam (VALIUM) 10 MG tablet Take 2 tablets (20 mg total) by mouth in the morning, at noon, and at bedtime. Home med. 10/04/21  Yes Kathrynn Ducking, MD  diclofenac Sodium (VOLTAREN) 1 % GEL Apply 2 g topically 4 (four) times daily. 09/05/21  Yes White, Adrienne R, NP  famotidine (PEPCID) 20 MG tablet TAKE 1 TABLET BY MOUTH TWICE A DAY 07/06/21  Yes Minette Brine, FNP  FLUoxetine (PROZAC) 10 MG capsule Take 1 capsule (10 mg total) by mouth at bedtime. 09/11/21  Yes Enzo Bi, MD  fluticasone (FLONASE) 50 MCG/ACT nasal spray Place 2 sprays into both nostrils daily. 11/29/21  Yes Scot Jun, FNP  gabapentin (NEURONTIN) 300 MG capsule TAKE 1 CAPSULE BY MOUTH  TWICE DAILY 11/08/21  Yes Suzzanne Cloud, NP  hydrOXYzine (VISTARIL) 100 MG capsule TAKE 1 CAPSULE BY MOUTH 3  TIMES DAILY AS NEEDED FOR  ITCHING Patient taking differently: Take 100 mg by mouth 3 (three) times daily as needed for itching. 03/01/21  Yes Minette Brine, FNP  insulin degludec (TRESIBA) 100 UNIT/ML FlexTouch Pen Inject 13 Units into the skin daily.   Yes [provider]  Multiple Vitamin (MULTIVITAMIN WITH MINERALS) TABS tablet Take 1 tablet by mouth daily. 09/11/21  Yes Enzo Bi, MD  vitamin E 180 MG (400 UNITS) capsule Take 400 Units by mouth daily.    Yes [provider]  baclofen (LIORESAL) 20 MG tablet Take 1 tablet 3 times daily 06/21/21   Suzzanne Cloud, NP  Continuous Blood Gluc Sensor (DEXCOM G6 SENSOR)  MISC 1 Device by Does not apply route as directed. 08/08/21   [name], Melanie Crazier, MD  Continuous Blood Gluc Transmit (DEXCOM G6 TRANSMITTER) MISC 1 Device by Does not apply route as directed. 08/08/21   [name], Melanie Crazier, MD  doxycycline (VIBRAMYCIN) 100 MG capsule Take 1 capsule (100 mg total) by mouth 2 (two) times daily. 09/05/21   White, Adrienne R, NP  EPINEPHRINE 0.3 mg/0.3 mL IJ SOAJ injection INJECT INTRAMUSCULARLY 1  PEN AS NEEDED FOR ALLERGIC  RESPONSE AS DIRECTED BY MD. [place] HELP AFTER  USE. 07/11/21   Minette Brine, FNP  Glucagon (GVOKE HYPOPEN 2-PACK) 0.5 MG/0.1ML SOAJ Inject 0.1 mLs into the skin as needed (if blood sugar less than 70). Patient not taking: Reported on 11/10/2021 05/19/21   Minette Brine, FNP  glucose blood test strip Use as instructed to check sugars qac and qhs 05/27/21   Ghumman, Ramandeep, NP  insulin aspart (NOVOLOG FLEXPEN) 100 UNIT/ML FlexPen Max daily 30 units Patient not taking: Reported on 11/10/2021 11/09/21   [name], Melanie Crazier, MD  Insulin Pen Needle 32G X 4 MM MISC 1 each by Does not apply route 2 (two) times daily. Dx E11.9 05/27/21   Minette Brine, FNP    Family History Family History  Problem Relation Age of Onset   Cancer Mother    Other Mother    COPD Father    Asthma Brother    Cancer Brother        colon   Heart attack Brother    Seizures Brother    Allergic rhinitis Neg Hx    Angioedema Neg Hx    Eczema Neg Hx    Immunodeficiency Neg Hx    Urticaria Neg Hx     Social History Social History   Tobacco Use   Smoking status: Never   Smokeless tobacco: Never   Tobacco comments:    Never Used Tobacco  Vaping Use   Vaping Use: Never used  Substance Use Topics   Alcohol use: Not Currently    Alcohol/week: 0.0 standard drinks    Comment: Occasional   Drug use: Not Currently    Types: Cocaine     Allergies   Ibuprofen, Lemon flavor, Amoxicillin, and Tylenol [acetaminophen]   Review of  Systems Review of Systems Pertinent negatives listed in HPI  Physical Exam Triage Vital Signs ED Triage Vitals [11/29/21 1014]  Enc Vitals Group     BP 117/83     Pulse Rate (!) 117     Resp 18     Temp 99 F (37.2 C)     Temp Source Oral     SpO2 97 %     Weight      Height      Head Circumference      Peak Flow      Pain Score      Pain Loc      Pain Edu?      Excl. in [place]?    No data found.  Updated Vital Signs BP 117/83 (BP Location: Left Arm)   Pulse (!) 117   Temp 99 F (37.2 C) (Oral)   Resp 18   LMP 07/29/2012 (Approximate)   SpO2 97%   Visual Acuity Right Eye Distance:   Left Eye Distance:   Bilateral Distance:    Right Eye Near:   Left Eye Near:    Bilateral Near:     Physical Exam General appearance: alert, Ill-appearing, no distress Head: Normocephalic, without obvious abnormality, atraumatic ENT: External ears normal, nares with mucosal edema, congestion, oropharynx w/o exudate Respiratory: Respirations even , unlabored, coarse lung sound, no audible wheeze present on exam Heart: Rate and rhythm normal. No gallop or murmurs noted on exam  Extremities: No gross deformities Skin: Skin color, texture, turgor normal. No rashes seen  Psych: Appropriate mood and affect. Neurologic: No obvious focal neurological abnormalities present on exam. UC Treatments / Results  Labs (all labs ordered are listed,  but only abnormal results are displayed) Labs Reviewed  COVID-19, FLU A+B NAA    EKG   Radiology No results found.  Procedures Procedures (including critical care time)  Medications Ordered in UC Medications - No data to display  Initial Impression / Assessment and Plan / UC Course  I have reviewed the triage vital signs and the nursing notes.  Pertinent labs & imaging results that were available during my care of the patient were reviewed by me and considered in my medical decision making (see chart for details).    Treating for acute  bronchitis, collected a COVID/flu test given patient's underlying conditions.  Treatment per discharge medication orders.  Precautions given which warrant emergent evaluation in ER. Return PRN. Final Clinical Impressions(s) / UC Diagnoses   Final diagnoses:  Encounter for screening for COVID-19  Acute bronchitis, unspecified organism     Discharge Instructions      COVID/Flu test pending. Symptom management warranted only.   Start azithromycin for treatment bronchitis infection. Manage fever with Tylenol and ibuprofen.  Nasal symptoms with over-the-counter antihistamines recommended.  Treatment per discharge medications/discharge instructions.  Red flags/ER precautions given. The most current CDC isolation/quarantine recommendation advised.     ED Prescriptions     Medication Sig Dispense Auth. Provider   azithromycin (ZITHROMAX) 250 MG tablet Take 2 tabs PO x 1 dose, then 1 tab PO QD x 4 days 6 tablet Scot Jun, FNP   fluticasone (FLONASE) 50 MCG/ACT nasal spray Place 2 sprays into both nostrils daily. 16 g Scot Jun, FNP      PDMP not reviewed this encounter.   Scot Jun, FNP 11/29/21 1028

## 2021-11-29 NOTE — ED Triage Notes (Signed)
Pt c/o cough, runny nose, sneezing x 1 week.

## 2021-11-30 LAB — COVID-19, FLU A+B NAA
Influenza A, NAA: NOT DETECTED
Influenza B, NAA: NOT DETECTED
SARS-CoV-2, NAA: NOT DETECTED

## 2021-12-05 ENCOUNTER — Ambulatory Visit: Payer: Medicare Other | Admitting: Dermatology

## 2021-12-06 ENCOUNTER — Telehealth: Payer: Medicare Other

## 2021-12-06 ENCOUNTER — Telehealth: Payer: Self-pay

## 2021-12-06 NOTE — Telephone Encounter (Signed)
°  Care Management   Follow Up Note   12/06/2021 Name: Sherri [name] MRN: [id number] DOB: 1966-10-16   Referred by: Arnette Felts, FNP Reason for referral : Chronic Care Management (Unsuccessful call)   An unsuccessful telephone outreach was attempted today. The patient was referred to the case management team for assistance with care management and care coordination. SW left a HIPAA compliant voice message requesting a return call.  Follow Up Plan: The care management team will reach out to the patient again over the next 21 days.   [name], BSW, CDP Social Worker, Certified Dementia Practitioner [facility] Care Management [phone]

## 2021-12-09 ENCOUNTER — Ambulatory Visit (INDEPENDENT_AMBULATORY_CARE_PROVIDER_SITE_OTHER): Payer: Medicare Other

## 2021-12-09 DIAGNOSIS — G2582 Stiff-man syndrome: Secondary | ICD-10-CM

## 2021-12-09 DIAGNOSIS — J45909 Unspecified asthma, uncomplicated: Secondary | ICD-10-CM

## 2021-12-09 DIAGNOSIS — E1021 Type 1 diabetes mellitus with diabetic nephropathy: Secondary | ICD-10-CM
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
## 2021-12-20 ENCOUNTER — Telehealth: Payer: Medicare Other

## 2021-12-20 ENCOUNTER — Ambulatory Visit: Payer: Self-pay

## 2021-12-20 DIAGNOSIS — G2582 Stiff-man syndrome: Secondary | ICD-10-CM

## 2021-12-20 DIAGNOSIS — E1021 Type 1 diabetes mellitus with diabetic nephropathy: Secondary | ICD-10-CM

## 2021-12-20 DIAGNOSIS — E063 Autoimmune thyroiditis: Secondary | ICD-10-CM

## 2021-12-20 DIAGNOSIS — K219 Gastro-esophageal reflux disease without esophagitis: Secondary | ICD-10-CM

## 2021-12-20 DIAGNOSIS — F32A Depression, unspecified: Secondary | ICD-10-CM

## 2021-12-20 DIAGNOSIS — J45909 Unspecified asthma, uncomplicated: Secondary | ICD-10-CM

## 2021-12-20 NOTE — Patient Instructions (Signed)
Visit Information  Thank you for taking time to visit with me today. Please don't hesitate to contact me if I can be of assistance to you before our next scheduled telephone appointment.  Following are the goals we discussed today:  (Copy and paste patient goals from clinical care plan here)  Our next appointment is by telephone on 01/02/22 at 2:00 PM  Please call the care guide team at [phone] if you need to cancel or reschedule your appointment.   If you are experiencing a Mental Health or Behavioral Health Crisis or need someone to talk to, please call 1-800-273-TALK (toll free, 24 hour hotline)   Patient verbalizes understanding of instructions provided today and agrees to view in MyChart.    Delsa Sale, RN, BSN, CCM Care Management Coordinator [facility] Care Management/[facility]  Direct Phone: [phone]

## 2021-12-20 NOTE — Chronic Care Management (AMB) (Signed)
Chronic Care Management   CCM RN Visit Note  12/20/2021 Name: Sherri [name] MRN: [id number] DOB: [birth year]  Subjective: Sherri [name] is a 55 y.o. year old female who is a primary care patient of Arnette Felts, FNP. The care management team was consulted for assistance with disease management and care coordination needs.    Engaged with patient by telephone for follow up visit in response to provider referral for case management and/or care coordination services.   Consent to Services:  The patient was given information about Chronic Care Management services, agreed to services, and gave verbal consent prior to initiation of services.  Please see initial visit note for detailed documentation.   Patient agreed to services and verbal consent obtained.   Assessment: Review of patient past medical history, allergies, medications, health status, including review of consultants reports, laboratory and other test data, was performed as part of comprehensive evaluation and provision of chronic care management services.   SDOH (Social Determinants of Health) assessments and interventions performed:    CCM Care Plan  Allergies  Allergen Reactions   Ibuprofen Other (See Comments)    Does not take due to hx of renal insufficiency "I have kidney disease"    Lemon Flavor Swelling    Severe Lip Swelling  FRUIT per pt.     Amoxicillin Diarrhea and Other (See Comments)   Tylenol [Acetaminophen] Hives    Cannot take large quantities    Outpatient Encounter Medications as of 12/20/2021  Medication Sig Note   acetaminophen (TYLENOL) 500 MG tablet Take 1 tablet (500 mg total) by mouth every 6 (six) hours as needed.    albuterol (VENTOLIN HFA) 108 (90 Base) MCG/ACT inhaler TAKE 2 PUFFS BY MOUTH EVERY 6 HOURS AS NEEDED FOR WHEEZE OR SHORTNESS OF BREATH (Patient taking differently: Inhale 2 puffs into the lungs every 6 (six) hours as needed for wheezing or shortness of breath.)    Ascorbic Acid  (VITAMIN C) 1000 MG tablet TAKE 1 TABLET BY MOUTH EVERY DAY    azithromycin (ZITHROMAX) 250 MG tablet Take 2 tabs PO x 1 dose, then 1 tab PO QD x 4 days    baclofen (LIORESAL) 20 MG tablet Take 1 tablet 3 times daily    Cholecalciferol (VITAMIN D3) 10 MCG (400 UNIT) tablet Take 400 Units by mouth daily.    Continuous Blood Gluc Sensor (DEXCOM G6 SENSOR) MISC 1 Device by Does not apply route as directed.    Continuous Blood Gluc Transmit (DEXCOM G6 TRANSMITTER) MISC 1 Device by Does not apply route as directed.    Cranberry-Vitamin C-Vitamin E 4200-20-3 MG-MG-UNIT CAPS Take 1 tablet by mouth daily.    cyanocobalamin (,VITAMIN B-12,) 1000 MCG/ML injection Inject 1,000 mcg into the muscle every 3 (three) months.    desloratadine (CLARINEX) 5 MG tablet TAKE 1 TABLET BY MOUTH  DAILY    diazepam (VALIUM) 10 MG tablet Take 2 tablets (20 mg total) by mouth in the morning, at noon, and at bedtime. Home med.    diclofenac Sodium (VOLTAREN) 1 % GEL Apply 2 g topically 4 (four) times daily. 09/08/2021: Hasn't started   doxycycline (VIBRAMYCIN) 100 MG capsule Take 1 capsule (100 mg total) by mouth 2 (two) times daily. 09/08/2021: ABT Start Date 09/05/21. 7 Day Course   EPINEPHRINE 0.3 mg/0.3 mL IJ SOAJ injection INJECT INTRAMUSCULARLY 1  PEN AS NEEDED FOR ALLERGIC  RESPONSE AS DIRECTED BY MD. Assunta Found MEDICAL HELP AFTER  USE.    famotidine (PEPCID) 20 MG tablet TAKE 1  TABLET BY MOUTH TWICE A DAY    FLUoxetine (PROZAC) 10 MG capsule Take 1 capsule (10 mg total) by mouth at bedtime.    fluticasone (FLONASE) 50 MCG/ACT nasal spray Place 2 sprays into both nostrils daily.    gabapentin (NEURONTIN) 300 MG capsule TAKE 1 CAPSULE BY MOUTH  TWICE DAILY    Glucagon (GVOKE HYPOPEN 2-PACK) 0.5 MG/0.1ML SOAJ Inject 0.1 mLs into the skin as needed (if blood sugar less than 70). (Patient not taking: Reported on 11/10/2021)    glucose blood test strip Use as instructed to check sugars qac and qhs    hydrOXYzine (VISTARIL) 100 MG  capsule TAKE 1 CAPSULE BY MOUTH 3  TIMES DAILY AS NEEDED FOR  ITCHING (Patient taking differently: Take 100 mg by mouth 3 (three) times daily as needed for itching.)    insulin aspart (NOVOLOG FLEXPEN) 100 UNIT/ML FlexPen Max daily 30 units (Patient not taking: Reported on 11/10/2021)    insulin degludec (TRESIBA) 100 UNIT/ML FlexTouch Pen Inject 13 Units into the skin daily.    Insulin Pen Needle 32G X 4 MM MISC 1 each by Does not apply route 2 (two) times daily. Dx E11.9    Multiple Vitamin (MULTIVITAMIN WITH MINERALS) TABS tablet Take 1 tablet by mouth daily.    vitamin E 180 MG (400 UNITS) capsule Take 400 Units by mouth daily.     No facility-administered encounter medications on file as of 12/20/2021.    Patient Active Problem List   Diagnosis Date Noted   Type 1 diabetes mellitus with hyperglycemia (HCC) 11/11/2021   Malnutrition of moderate degree 09/09/2021   AMS (altered mental status) 09/09/2021   Acute encephalopathy 09/08/2021   Acute lower UTI 09/08/2021   Elevated LFTs 09/08/2021   Elevated lipase 09/08/2021   Hashimoto's disease 08/09/2021   Hypoglycemia 05/05/2021   AKI (acute kidney injury) (HCC) 05/05/2021   Anxiety    Chronic rhinitis 04/24/2019   Keloid 10/10/2018   Depression 01/02/2017   History of DVT (deep vein thrombosis) 01/02/2017   Chronic pain syndrome 01/02/2017   Cocaine abuse (HCC) 01/02/2017   Dysphagia    Adjustment insomnia    Distal radius fracture, right 08/16/2016   Postconcussion syndrome 12/30/2014   Tremor 03/11/2014   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   Hiatal hernia 03/21/2013   Anemia, iron deficiency 01/29/2013   Spasm of muscle 09/02/2012   Anemia 08/28/2012   Mild intermittent asthma 08/27/2012   Chronic back pain    Pernicious anemia 10/30/2011   DEGENERATIVE DISC DISEASE 08/18/2010   Asthma with bronchitis 07/28/2010   Recurrent urticaria 07/28/2010    Conditions to be addressed/monitored: DMI, Stiff-Person Syndrome,  Asthma with bronchitis, GERD, Depression, Hashimoto's disease  Care Plan : RN Care Manager Plan of Care  Updates made by [name], RN since 12/20/2021 12:00 AM     Problem: No plan of care established for management of chronic disease states (DMI, Stiff-Person Syndrome, Asthma with bronchitis, GERD, Depression, Hashimoto's disease)   Priority: High     Long-Range Goal: Establishment of plan of care for management of chronic disease states (DMI, Stiff-Person Syndrome, Asthma with bronchitis, GERD, Depression, Hashimoto's disease)   Start Date: 12/20/2021  Expected End Date: 12/20/2022  This Visit's Progress: On track  Priority: High  Note:   Current Barriers:  Knowledge Deficits related to plan of care for management of DMI, Stiff-Person Syndrome, Asthma with bronchitis, GERD, Depression, Hashimoto's disease  Chronic Disease Management support and education needs related to DMI, Stiff-Person  Syndrome, Asthma with bronchitis, GERD, Depression, Hashimoto's disease   RNCM Clinical Goal(s):  Patient will verbalize basic understanding of  DMI, Stiff-Person Syndrome, Asthma with bronchitis, GERD, Depression, Hashimoto's disease disease process and self health management plan as evidenced by patient will report having no disease exacerbation related to her chronic disease states as listed above take all medications exactly as prescribed and will call provider for medication related questions as evidenced by patient will report having no missed doses of her prescribed medications  demonstrate Improved health management independence as evidenced by patient will report 100% adherence to her prescribed treatment plan  continue to work with RN Care Manager to address care management and care coordination needs related to  DMI, Stiff-Person Syndrome, Asthma with bronchitis, GERD, Depression, Hashimoto's disease as evidenced by adherence to CM Team Scheduled appointments demonstrate ongoing self  health care management ability   as evidenced by    through collaboration with RN Care manager, provider, and care team.   Interventions: 1:1 collaboration with primary care provider regarding development and update of comprehensive plan of care as evidenced by provider attestation and co-signature Inter-disciplinary care team collaboration (see longitudinal plan of care) Evaluation of current treatment plan related to  self management and patient's adherence to plan as established by provider  Diabetes Interventions:  (Status:  Goal on track:  Yes.) Long Term Goal Assessed patient's understanding of A1c goal: <7% Provided education to patient about basic DM disease process Reviewed medications with patient and discussed importance of medication adherence Counseled on importance of regular laboratory monitoring as prescribed Advised patient, providing education and rationale, to check cbg daily before meals and at bedtime and record, calling PCP and or RN CM for findings outside established parameters Review of patient status, including review of consultants reports, relevant laboratory and other test results, and medications completed Assessed social determinant of health barriers Educated on 15'15' rule, determined patient does not have a gvoke pen on hand but will pick up from pharmacy  Discussed patient is experiencing at least 1 low cbg weekly, running in the 50's for which she becomes symptomatic, she follows 15' rule protocol with good response so far Determined patient was unable to afford the Dexcom scanner, she will notify Dr. Lonzo Cloud Educated on the dangers of skipping meals; mailed printed educational materials Determined patient is eating more regularly and feels her cbgs are more stable over all  Reviewed scheduled/upcoming provider appointments including: next Endo follow up appointment scheduled for  Discussed plans with patient for ongoing care management follow up and  provided patient with direct contact information for care management team Lab Results  Component Value Date   HGBA1C 13.7 (A) 11/09/2021   Altered Mental Status:  (Status:  Goal on track:  Yes.)  Long Term Goal Evaluation of current treatment plan related to  Altered Mental Status , self-management and patient's adherence to plan as established by provider Review of patient status, including review of consultant's reports, relevant laboratory and other test results, and medications completed Reviewed medications with patient and discussed importance of medication adherence Reviewed scheduled/upcoming provider appointments including: follow up with PCP provider scheduled for 12/28/21 @3 :00 PM, [facility], Dr. scheduled for 12/29/21 @2 :00 PM, confirmed patient will drive herself to appointments  Discussed plans with patient for ongoing care management follow up and provided patient with direct contact information for care management team   Frailty Syndrome:  (Status:  Goal on track:  Yes.)  Long Term Goal Evaluation of current treatment plan  related to  frailty syndrome , self-management and patient's adherence to plan as established by provider Determined patient's nutritional status is improving as evidence by patient is eating more regularly and is gaining weight Reviewed and discussed nutritional and diabetes follow up:   ASSESSMENT Patient is here today alone.  She was last seen by an RD 01/21/2018 at [facility]. She would like to learn better eating habits to better manage her blood glucose.   History includes:  Type 1 diabetes with neuropathy, depression, stiff person syndrome, CKD, history of cocaine abuse, depression, hashimoto's disease, pernicious anemia Medications include:  Trisiba 13 units q am, Lyumjev 6 units plus sliding scale before meals, vitamin B-12, Cranberri/vitamin C/vitamin E, Vitamin D, MVI No Dexcom as this is too expensive. A1C 13.7% 11/07/2021 increased from 12.5% 09/08/2021    Weight hx: 63" 108 lbs 11/10/2021 105 lbs 9/16/202 175 lbs about 10 years ago.  Purposefully lost weight due to diabetes diagnosis.    Patient walks with a cane. She speaks to a Child psychotherapist once a week related to her diabetes. She is attending [facility] out patient. She is currently in school, child development and psychology. Gym at her apartment complex.   Weight 108 lb (49 kg), last menstrual period 07/29/2012. Body mass index is 19.13 kg/m   Determined patient reports current weight is up to 111 lbs  Reinforced importance of nutritional adherence as directed by Registered dietician  Discussed plans with patient for ongoing care management follow up and provided patient with direct contact information for care management team  Patient Goals/Self-Care Activities: Take all medications as prescribed Attend all scheduled provider appointments Call pharmacy for medication refills 3-7 days in advance of running out of medications Perform all self care activities independently  Perform IADL's (shopping, preparing meals, housekeeping, managing finances) independently Call provider office for new concerns or questions  Work with the social worker to address care coordination needs and will continue to work with the clinical team to address health care and disease management related needs drink 6 to 8 glasses of water each day fill half of plate with vegetables manage portion size  Follow Up Plan:  Telephone follow up appointment with care management team member scheduled for:  01/02/22      Plan:Telephone follow up appointment with care management team member scheduled for:  01/02/22  Delsa Sale, RN, BSN, CCM Care Management Coordinator [facility]  Direct Phone: [phone]

## 2021-12-21 ENCOUNTER — Other Ambulatory Visit: Payer: Self-pay | Admitting: Nurse Practitioner

## 2021-12-27 ENCOUNTER — Ambulatory Visit (INDEPENDENT_AMBULATORY_CARE_PROVIDER_SITE_OTHER): Payer: Medicare Other

## 2021-12-27 ENCOUNTER — Other Ambulatory Visit: Payer: Self-pay | Admitting: Neurology

## 2021-12-27 DIAGNOSIS — E1021 Type 1 diabetes mellitus with diabetic nephropathy: Secondary | ICD-10-CM

## 2021-12-27 DIAGNOSIS — G2582 Stiff-man syndrome: Secondary | ICD-10-CM

## 2021-12-27 DIAGNOSIS — F32A Depression, unspecified: Secondary | ICD-10-CM

## 2021-12-27 DIAGNOSIS — J45909 Unspecified asthma, uncomplicated: Secondary | ICD-10-CM
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
## 2021-12-28 ENCOUNTER — Ambulatory Visit: Payer: Medicare Other | Admitting: Nurse Practitioner

## 2021-12-29 ENCOUNTER — Ambulatory Visit: Payer: Medicare Other | Admitting: Neurology

## 2021-12-29 ENCOUNTER — Encounter: Payer: Self-pay | Admitting: Neurology

## 2021-12-31 ENCOUNTER — Other Ambulatory Visit: Payer: Self-pay | Admitting: Family Medicine

## 2022-01-02 ENCOUNTER — Other Ambulatory Visit: Payer: Self-pay | Admitting: Neurology

## 2022-01-02 ENCOUNTER — Ambulatory Visit: Payer: Self-pay

## 2022-01-02 ENCOUNTER — Telehealth: Payer: Medicare Other

## 2022-01-02 ENCOUNTER — Other Ambulatory Visit: Payer: Self-pay | Admitting: Nurse Practitioner

## 2022-01-02 DIAGNOSIS — J45909 Unspecified asthma, uncomplicated: Secondary | ICD-10-CM

## 2022-01-02 DIAGNOSIS — G2582 Stiff-man syndrome: Secondary | ICD-10-CM

## 2022-01-02 DIAGNOSIS — K219 Gastro-esophageal reflux disease without esophagitis: Secondary | ICD-10-CM

## 2022-01-02 DIAGNOSIS — E063 Autoimmune thyroiditis: Secondary | ICD-10-CM

## 2022-01-02 DIAGNOSIS — E1021 Type 1 diabetes mellitus with diabetic nephropathy: Secondary | ICD-10-CM

## 2022-01-02 DIAGNOSIS — F32A Depression, unspecified: Secondary | ICD-10-CM

## 2022-01-02 NOTE — Chronic Care Management (AMB) (Signed)
Chronic Care Management   CCM RN Visit Note  01/02/2022 Name: Sherri [name] MRN: [id number] DOB: 1966-04-18  Subjective: Sherri [name] is a 56 y.o. year old female who is a primary care patient of Minette Brine, [place]. The care management team was consulted for assistance with disease management and care coordination needs.    Engaged with patient by telephone for follow up visit in response to provider referral for case management and/or care coordination services.   Consent to Services:  The patient was given information about Chronic Care Management services, agreed to services, and gave verbal consent prior to initiation of services.  Please see initial visit note for detailed documentation.   Patient agreed to services and verbal consent obtained.   Assessment: Review of patient past medical history, allergies, medications, health status, including review of consultants reports, laboratory and other test data, was performed as part of comprehensive evaluation and provision of chronic care management services.   SDOH (Social Determinants of Health) assessments and interventions performed:  Yes, no acute challenges   CCM Care Plan  Allergies  Allergen Reactions   Ibuprofen Other (See Comments)    Does not take due to hx of renal insufficiency "I have kidney disease"    Lemon Flavor Swelling    Severe Lip Swelling  FRUIT per pt.     Amoxicillin Diarrhea and Other (See Comments)   Tylenol [Acetaminophen] Hives    Cannot take large quantities    Outpatient Encounter Medications as of 01/02/2022  Medication Sig Note   acetaminophen (TYLENOL) 500 MG tablet Take 1 tablet (500 mg total) by mouth every 6 (six) hours as needed.    albuterol (VENTOLIN HFA) 108 (90 Base) MCG/ACT inhaler TAKE 2 PUFFS BY MOUTH EVERY 6 HOURS AS NEEDED FOR WHEEZE OR SHORTNESS OF BREATH (Patient taking differently: Inhale 2 puffs into the lungs every 6 (six) hours as needed for wheezing or shortness of  breath.)    Ascorbic Acid (VITAMIN C) 1000 MG tablet TAKE 1 TABLET BY MOUTH EVERY DAY    azithromycin (ZITHROMAX) 250 MG tablet Take 2 tabs PO x 1 dose, then 1 tab PO QD x 4 days    baclofen (LIORESAL) 20 MG tablet Take 1 tablet 3 times daily    Cholecalciferol (VITAMIN D3) 10 MCG (400 UNIT) tablet Take 400 Units by mouth daily.    Continuous Blood Gluc Sensor (DEXCOM G6 SENSOR) MISC 1 Device by Does not apply route as directed.    Continuous Blood Gluc Transmit (DEXCOM G6 TRANSMITTER) MISC 1 Device by Does not apply route as directed.    Cranberry-Vitamin C-Vitamin E 4200-20-3 MG-MG-UNIT CAPS Take 1 tablet by mouth daily.    cyanocobalamin (,VITAMIN B-12,) 1000 MCG/ML injection Inject 1,000 mcg into the muscle every 3 (three) months.    desloratadine (CLARINEX) 5 MG tablet TAKE 1 TABLET BY MOUTH  DAILY    diazepam (VALIUM) 10 MG tablet Take 2 tablets (20 mg total) by mouth in the morning, at noon, and at bedtime. Home med.    diclofenac Sodium (VOLTAREN) 1 % GEL Apply 2 g topically 4 (four) times daily. 09/08/2021: Hasn't started   doxycycline (VIBRAMYCIN) 100 MG capsule Take 1 capsule (100 mg total) by mouth 2 (two) times daily. 09/08/2021: ABT Start Date 09/05/21. 7 Day Course   EPINEPHRINE 0.3 mg/0.3 mL IJ SOAJ injection INJECT INTRAMUSCULARLY 1  PEN AS NEEDED FOR ALLERGIC  RESPONSE AS DIRECTED BY MD. Kirke Corin MEDICAL HELP AFTER  USE.    famotidine (PEPCID) 20  MG tablet TAKE 1 TABLET BY MOUTH TWICE A DAY    FLUoxetine (PROZAC) 10 MG capsule Take 1 capsule (10 mg total) by mouth at bedtime.    fluticasone (FLONASE) 50 MCG/ACT nasal spray Place 2 sprays into both nostrils daily.    gabapentin (NEURONTIN) 300 MG capsule TAKE 1 CAPSULE BY MOUTH  TWICE DAILY    Glucagon (GVOKE HYPOPEN 2-PACK) 0.5 MG/0.1ML SOAJ Inject 0.1 mLs into the skin as needed (if blood sugar less than 70). (Patient not taking: Reported on 11/10/2021)    glucose blood test strip Use as instructed to check sugars qac and qhs     hydrOXYzine (VISTARIL) 100 MG capsule TAKE 1 CAPSULE BY MOUTH 3  TIMES DAILY AS NEEDED FOR  ITCHING (Patient taking differently: Take 100 mg by mouth 3 (three) times daily as needed for itching.)    insulin aspart (NOVOLOG FLEXPEN) 100 UNIT/ML FlexPen Max daily 30 units (Patient not taking: Reported on 11/10/2021)    insulin degludec (TRESIBA) 100 UNIT/ML FlexTouch Pen Inject 13 Units into the skin daily.    Insulin Pen Needle 32G X 4 MM MISC 1 each by Does not apply route 2 (two) times daily. Dx E11.9    Multiple Vitamin (MULTIVITAMIN WITH MINERALS) TABS tablet Take 1 tablet by mouth daily.    vitamin E 180 MG (400 UNITS) capsule Take 400 Units by mouth daily.     No facility-administered encounter medications on file as of 01/02/2022.    Patient Active Problem List   Diagnosis Date Noted   Type 1 diabetes mellitus with hyperglycemia (HCC) 11/11/2021   Malnutrition of moderate degree 09/09/2021   AMS (altered mental status) 09/09/2021   Acute encephalopathy 09/08/2021   Acute lower UTI 09/08/2021   Elevated LFTs 09/08/2021   Elevated lipase 09/08/2021   Hashimoto's disease 08/09/2021   Hypoglycemia 05/05/2021   AKI (acute kidney injury) (HCC) 05/05/2021   Anxiety    Chronic rhinitis 04/24/2019   Keloid 10/10/2018   Depression 01/02/2017   History of DVT (deep vein thrombosis) 01/02/2017   Chronic pain syndrome 01/02/2017   Cocaine abuse (HCC) 01/02/2017   Dysphagia    Adjustment insomnia    Distal radius fracture, right 08/16/2016   Postconcussion syndrome 12/30/2014   Tremor 03/11/2014   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   Hiatal hernia 03/21/2013   Anemia, iron deficiency 01/29/2013   Spasm of muscle 09/02/2012   Anemia 08/28/2012   Mild intermittent asthma 08/27/2012   Chronic back pain    Pernicious anemia 10/30/2011   DEGENERATIVE DISC DISEASE 08/18/2010   Asthma with bronchitis 07/28/2010   Recurrent urticaria 07/28/2010    Conditions to be addressed/monitored: DMI,  Stiff-Person Syndrome, Asthma with bronchitis, GERD, Depression, Hashimoto's disease  Care Plan : RN Care Manager Plan of Care  Updates made by [name], RN since 01/02/2022 12:00 AM     Problem: No plan of care established for management of chronic disease states (DMI, Stiff-Person Syndrome, Asthma with bronchitis, GERD, Depression, Hashimoto's disease)   Priority: High     Long-Range Goal: Establishment of plan of care for management of chronic disease states (DMI, Stiff-Person Syndrome, Asthma with bronchitis, GERD, Depression, Hashimoto's disease)   Start Date: 12/20/2021  Expected End Date: 12/20/2022  Recent Progress: On track  Priority: High  Note:   Current Barriers:  Knowledge Deficits related to plan of care for management of DMI, Stiff-Person Syndrome, Asthma with bronchitis, GERD, Depression, Hashimoto's disease  Chronic Disease Management support and education needs related  to DMI, Stiff-Person Syndrome, Asthma with bronchitis, GERD, Depression, Hashimoto's disease   RNCM Clinical Goal(s):  Patient will verbalize basic understanding of  DMI, Stiff-Person Syndrome, Asthma with bronchitis, GERD, Depression, Hashimoto's disease disease process and self health management plan as evidenced by patient will report having no disease exacerbation related to her chronic disease states as listed above take all medications exactly as prescribed and will call provider for medication related questions as evidenced by patient will report having no missed doses of her prescribed medications  demonstrate Improved health management independence as evidenced by patient will report 100% adherence to her prescribed treatment plan  continue to work with RN Care Manager to address care management and care coordination needs related to  DMI, Stiff-Person Syndrome, Asthma with bronchitis, GERD, Depression, Hashimoto's disease as evidenced by adherence to CM Team Scheduled appointments demonstrate  ongoing self health care management ability   as evidenced by    through collaboration with RN Care manager, provider, and care team.   Interventions: 1:1 collaboration with primary care provider regarding development and update of comprehensive plan of care as evidenced by provider attestation and co-signature Inter-disciplinary care team collaboration (see longitudinal plan of care) Evaluation of current treatment plan related to  self management and patient's adherence to plan as established by provider  Diabetes Interventions:  (Status:  Goal on track:  Yes.) Long Term Goal Assessed patient's understanding of A1c goal: <7% Provided education to patient about basic DM disease process Reviewed medications with patient and discussed importance of medication adherence Counseled on importance of regular laboratory monitoring as prescribed Advised patient, providing education and rationale, to check cbg daily before meals and at bedtime and record, calling PCP and or RN CM for findings outside established parameters Review of patient status, including review of consultants reports, relevant laboratory and other test results, and medications completed Assessed social determinant of health barriers Discussed patient continues to experience low cbg's at home, lowest 42-50, most low averages are running in 80-90's Re-Educated on 15'15' rule, determined patient does not have a gvoke pen on hand but will pick up from pharmacy Reviewed patient's eating pattern and carb choices, offered to send a Carb Choice list, patient declines and will continue to follow the written materials provided by the nutritionist  Determined patient received her Dexcom Scanner and is staying more aware of her cbgs Reviewed scheduled/upcoming provider appointments including: upcoming Endocrinology follow with Dr. Kelton Pillar scheduled for 02/09/22 @1  PM Mailed printed educational materials related to the MIND diet Discussed plans  with patient for ongoing care management follow up and provided patient with direct contact information for care management team Lab Results  Component Value Date   HGBA1C 13.7 (A) 11/09/2021   Altered Mental Status:  (Status:  Condition stable.  Not addressed this visit.)  Long Term Goal Evaluation of current treatment plan related to  Altered Mental Status , self-management and patient's adherence to plan as established by provider Review of patient status, including review of consultant's reports, relevant laboratory and other test results, and medications completed Reviewed medications with patient and discussed importance of medication adherence Reviewed scheduled/upcoming provider appointments including: follow up with PCP provider scheduled for 12/28/21 @3 :00 PM, [facility], Dr. Krista Blue scheduled for 12/29/21 @2 :00 PM, confirmed patient will drive herself to appointments  Discussed plans with patient for ongoing care management follow up and provided patient with direct contact information for care management team  Frailty Syndrome:  (Status:  Condition stable.  Not addressed this visit.)  Long Term Goal Evaluation of current treatment plan related to  frailty syndrome , self-management and patient's adherence to plan as established by provider Determined patient's nutritional status is improving as evidence by patient is eating more regularly and is gaining weight Reviewed and discussed nutritional and diabetes follow up:   ASSESSMENT Patient is here today alone.  She was last seen by an RD 01/21/2018 at [facility]. She would like to learn better eating habits to better manage her blood glucose.   History includes:  Type 1 diabetes with neuropathy, depression, stiff person syndrome, CKD, history of cocaine abuse, depression, hashimoto's disease, pernicious anemia Medications include:  Trisiba 13 units q am, Lyumjev 6 units plus sliding scale before meals, vitamin B-12, Cranberri/vitamin C/vitamin E, Vitamin  D, MVI No Dexcom as this is too expensive. A1C 13.7% 11/07/2021 increased from 12.5% 09/08/2021   Weight hx: 63" 108 lbs 11/10/2021 105 lbs 9/16/202 175 lbs about 10 years ago.  Purposefully lost weight due to diabetes diagnosis.    Patient walks with a cane. She speaks to a Education officer, museum once a week related to her diabetes. She is attending [employer] out patient. She is currently in school, child development and psychology. Gym at her apartment complex.   Weight 108 lb (49 kg), last menstrual period 07/29/2012. Body mass index is 19.13 kg/m   Determined patient reports current weight is up to 111 lbs  Reinforced importance of nutritional adherence as directed by Registered dietician  Discussed plans with patient for ongoing care management follow up and provided patient with direct contact information for care management team  Patient Goals/Self-Care Activities: Take all medications as prescribed Attend all scheduled provider appointments Call pharmacy for medication refills 3-7 days in advance of running out of medications Perform all self care activities independently  Perform IADL's (shopping, preparing meals, housekeeping, managing finances) independently Call provider office for new concerns or questions  Work with the social worker to address care coordination needs and will continue to work with the clinical team to address health care and disease management related needs drink 6 to 8 glasses of water each day fill half of plate with vegetables manage portion size  Follow Up Plan:  Telephone follow up appointment with care management team member scheduled for:  02/13/22      Plan:Telephone follow up appointment with care management team member scheduled for:  02/13/22  Barb Merino, RN, BSN, CCM Care Management Coordinator [facility]  Direct Phone: [phone]

## 2022-01-02 NOTE — Patient Instructions (Signed)
Visit Information  Thank you for taking time to visit with me today. Please don't hesitate to contact me if I can be of assistance to you before our next scheduled telephone appointment.  Following are the goals we discussed today:  (Copy and paste patient goals from clinical care plan here)  Our next appointment is by telephone on 02/13/22 at 12:35 PM   Please call the care guide team at [phone] if you need to cancel or reschedule your appointment.   If you are experiencing a Mental Health or Behavioral Health Crisis or need someone to talk to, please call 1-800-273-TALK (toll free, 24 hour hotline)   Patient verbalizes understanding of instructions provided today and agrees to view in MyChart.   Delsa Sale, RN, BSN, CCM Care Management Coordinator [facility]  Direct Phone: [phone]

## 2022-01-03 ENCOUNTER — Ambulatory Visit: Payer: Medicare Other | Admitting: Neurology

## 2022-01-03 ENCOUNTER — Encounter: Payer: Self-pay | Admitting: Neurology
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
## 2022-01-05 ENCOUNTER — Other Ambulatory Visit: Payer: Self-pay | Admitting: Neurology

## 2022-01-05 MED ORDER — DIAZEPAM 10 MG PO TABS: 20.0000 mg | ORAL_TABLET | Freq: Three times a day (TID) | ORAL | 1 refills | Status: DC

## 2022-01-05 NOTE — Telephone Encounter (Signed)
Pt has requested refill for diazepam 10 mg. Per [place] drug registry last refill was 11/02/21 # 180 for a 30 day pt. Pt previously seen by Dr. Anne Hahn.

## 2022-01-05 NOTE — Telephone Encounter (Signed)
Pt request refill for diazepam (VALIUM) 10 MG tablet at [employer] [facility]) -   Pt rescheduled appt 06/08/22 at 11:15am

## 2022-01-05 NOTE — Telephone Encounter (Signed)
[name], this patient no showed her appointment with me this week. I would like for her to see MD next only to continue to evaluate her need for high doses of Benzo's, there have been some ER visit that question over medication. With Stiff Person Syndrome I think Dr. Terrace Arabia or Penumalli would be best. I will refill for 2 months. Thanks

## 2022-01-06 ENCOUNTER — Telehealth: Payer: Self-pay

## 2022-01-06 ENCOUNTER — Telehealth: Payer: Self-pay | Admitting: Internal Medicine

## 2022-01-06 NOTE — Telephone Encounter (Signed)
Called and advised pt of provider advise. Pt verbalized understanding.

## 2022-01-06 NOTE — Telephone Encounter (Signed)
Called and spoke with pt she does not know if she took her Evaristo Bury but she says she was taking her meal time insulin as instructed. Pt wants to know if taking her long acting this late will cause her a severe low this late in the day. She normally takes her [place] early in the morning. Pt's blood sugar has come down and is currently 204 as of 15 minutes ago. Pt can be contacted via Mychart with instructions.

## 2022-01-06 NOTE — Telephone Encounter (Signed)
°  Care Management   Follow Up Note   01/06/2022 Name: Sherri [name] MRN: [id number] DOB: [birth year]   Referred by: Arnette Felts, FNP Reason for referral : Chronic Care Management   Inbound call received from the patient stating she is unsure if she took her long acting insulin this morning and questions if it is okay to administer a dose this evening. Advised the patient SW is unable to give medication advice but could assist her in contacting her endocrinologist office. Patient declined assistance stating she has the contact number and will call.  Follow Up Plan: Patient will contact Dr. Harvel Ricks office for guidance on insulin administration.  [name], BSW, CDP Social Worker, Certified Dementia Practitioner [facility] Care Management [phone]

## 2022-01-06 NOTE — Telephone Encounter (Signed)
Patient requests to be called at ph# [phone] re: Patient states her blood sugars have fluctuating up (Today-01/06/22-highest=346) and down (Today-01/06/22-lowest=80) and wants to be advised on if Patient should takeTresiba long lasting insulin. Patient states she does not remember whether she took long lasting Tresiba this morning.

## 2022-01-11 ENCOUNTER — Telehealth: Payer: Self-pay

## 2022-01-11 ENCOUNTER — Ambulatory Visit: Payer: Self-pay

## 2022-01-11 ENCOUNTER — Telehealth: Payer: Medicare Other

## 2022-01-11 DIAGNOSIS — E1021 Type 1 diabetes mellitus with diabetic nephropathy: Secondary | ICD-10-CM

## 2022-01-11 DIAGNOSIS — K219 Gastro-esophageal reflux disease without esophagitis: Secondary | ICD-10-CM

## 2022-01-11 DIAGNOSIS — G2582 Stiff-man syndrome: Secondary | ICD-10-CM

## 2022-01-11 DIAGNOSIS — F32A Depression, unspecified: Secondary | ICD-10-CM

## 2022-01-11 DIAGNOSIS — E063 Autoimmune thyroiditis: Secondary | ICD-10-CM

## 2022-01-11 DIAGNOSIS — J45909 Unspecified asthma, uncomplicated: Secondary | ICD-10-CM

## 2022-01-11 NOTE — Chronic Care Management (AMB) (Signed)
°  Sherri [name] was reminded to have all medications, supplements and any blood glucose and blood pressure readings available for review with Sherri [name], Pharm. D, at her telephone visit on 01-17-2022 at 3:00.  Questions: Have you had any recent office visit or specialist visit outside of [facility]? Patient stated no.  Are there any concerns you would like to discuss during your office visit? Patient stated no.  Are you having any problems obtaining your medications? (Whether it pharmacy issues or cost) Patient stated no.  If patient has any PAP medications ask if they are having any problems getting their PAP medication or refill? No PAP medications   Care Gaps: Covid vaccine overdue Yearly ophthalmology exam overdue Shingrix overdue AWV 08-17-2022   Star Rating Drug: Farxiga 5 mg- Last filled 10-17-2021 [facility] Metformin 500 mg- Last filled 11-08-2021 [facility]  Any gaps in medications fill history? No  Huey Romans [facility] Clinical Pharmacist Assistant [phone]

## 2022-01-11 NOTE — Chronic Care Management (AMB) (Signed)
Chronic Care Management   CCM RN Visit Note  01/11/2022 Name: Sherri [name] MRN: [id number] DOB: 1966-06-20  Subjective: Sherri [name] is a 56 y.o. year old female who is a primary care patient of Arnette Felts, FNP. The care management team was consulted for assistance with disease management and care coordination needs.    Engaged with patient by telephone for follow up visit in response to provider referral for case management and/or care coordination services.   Consent to Services:  The patient was given information about Chronic Care Management services, agreed to services, and gave verbal consent prior to initiation of services.  Please see initial visit note for detailed documentation.   Patient agreed to services and verbal consent obtained.   Assessment: Review of patient past medical history, allergies, medications, health status, including review of consultants reports, laboratory and other test data, was performed as part of comprehensive evaluation and provision of chronic care management services.   SDOH (Social Determinants of Health) assessments and interventions performed: Yes, no acute challenges    CCM Care Plan  Allergies  Allergen Reactions   Ibuprofen Other (See Comments)    Does not take due to hx of renal insufficiency "I have kidney disease"    Lemon Flavor Swelling    Severe Lip Swelling  FRUIT per pt.     Amoxicillin Diarrhea and Other (See Comments)   Tylenol [Acetaminophen] Hives    Cannot take large quantities    Outpatient Encounter Medications as of 01/11/2022  Medication Sig Note   acetaminophen (TYLENOL) 500 MG tablet Take 1 tablet (500 mg total) by mouth every 6 (six) hours as needed.    albuterol (VENTOLIN HFA) 108 (90 Base) MCG/ACT inhaler TAKE 2 PUFFS BY MOUTH EVERY 6 HOURS AS NEEDED FOR WHEEZE OR SHORTNESS OF BREATH (Patient taking differently: Inhale 2 puffs into the lungs every 6 (six) hours as needed for wheezing or shortness of  breath.)    Ascorbic Acid (VITAMIN C) 1000 MG tablet TAKE 1 TABLET BY MOUTH EVERY DAY    azithromycin (ZITHROMAX) 250 MG tablet Take 2 tabs PO x 1 dose, then 1 tab PO QD x 4 days    baclofen (LIORESAL) 20 MG tablet Take 1 tablet 3 times daily    Cholecalciferol (VITAMIN D3) 10 MCG (400 UNIT) tablet Take 400 Units by mouth daily.    Continuous Blood Gluc Sensor (DEXCOM G6 SENSOR) MISC 1 Device by Does not apply route as directed.    Continuous Blood Gluc Transmit (DEXCOM G6 TRANSMITTER) MISC 1 Device by Does not apply route as directed.    Cranberry-Vitamin C-Vitamin E 4200-20-3 MG-MG-UNIT CAPS Take 1 tablet by mouth daily.    cyanocobalamin (,VITAMIN B-12,) 1000 MCG/ML injection Inject 1,000 mcg into the muscle every 3 (three) months.    desloratadine (CLARINEX) 5 MG tablet TAKE 1 TABLET BY MOUTH  DAILY    diazepam (VALIUM) 10 MG tablet Take 2 tablets (20 mg total) by mouth in the morning, at noon, and at bedtime. Home med.    diclofenac Sodium (VOLTAREN) 1 % GEL Apply 2 g topically 4 (four) times daily. 09/08/2021: Hasn't started   doxycycline (VIBRAMYCIN) 100 MG capsule Take 1 capsule (100 mg total) by mouth 2 (two) times daily. 09/08/2021: ABT Start Date 09/05/21. 7 Day Course   EPINEPHRINE 0.3 mg/0.3 mL IJ SOAJ injection INJECT INTRAMUSCULARLY 1  PEN AS NEEDED FOR ALLERGIC  RESPONSE AS DIRECTED BY MD. Assunta Found MEDICAL HELP AFTER  USE.    famotidine (PEPCID) 20  MG tablet TAKE 1 TABLET BY MOUTH TWICE A DAY    FLUoxetine (PROZAC) 10 MG capsule Take 1 capsule (10 mg total) by mouth daily. Please call and make overdue follow up appt. 1st attempt    fluticasone (FLONASE) 50 MCG/ACT nasal spray Place 2 sprays into both nostrils daily.    gabapentin (NEURONTIN) 300 MG capsule TAKE 1 CAPSULE BY MOUTH  TWICE DAILY    Glucagon (GVOKE HYPOPEN 2-PACK) 0.5 MG/0.1ML SOAJ Inject 0.1 mLs into the skin as needed (if blood sugar less than 70). (Patient not taking: Reported on 11/10/2021)    glucose blood test strip Use  as instructed to check sugars qac and qhs    hydrOXYzine (VISTARIL) 100 MG capsule TAKE 1 CAPSULE BY MOUTH 3  TIMES DAILY AS NEEDED FOR  ITCHING (Patient taking differently: Take 100 mg by mouth 3 (three) times daily as needed for itching.)    insulin aspart (NOVOLOG FLEXPEN) 100 UNIT/ML FlexPen Max daily 30 units (Patient not taking: Reported on 11/10/2021)    insulin degludec (TRESIBA) 100 UNIT/ML FlexTouch Pen Inject 13 Units into the skin daily.    Insulin Pen Needle 32G X 4 MM MISC 1 each by Does not apply route 2 (two) times daily. Dx E11.9    Multiple Vitamin (MULTIVITAMIN WITH MINERALS) TABS tablet Take 1 tablet by mouth daily.    vitamin E 180 MG (400 UNITS) capsule Take 400 Units by mouth daily.     No facility-administered encounter medications on file as of 01/11/2022.    Patient Active Problem List   Diagnosis Date Noted   Type 1 diabetes mellitus with hyperglycemia (HCC) 11/11/2021   Malnutrition of moderate degree 09/09/2021   AMS (altered mental status) 09/09/2021   Acute encephalopathy 09/08/2021   Acute lower UTI 09/08/2021   Elevated LFTs 09/08/2021   Elevated lipase 09/08/2021   Hashimoto's disease 08/09/2021   Hypoglycemia 05/05/2021   AKI (acute kidney injury) (HCC) 05/05/2021   Anxiety    Chronic rhinitis 04/24/2019   Keloid 10/10/2018   Depression 01/02/2017   History of DVT (deep vein thrombosis) 01/02/2017   Chronic pain syndrome 01/02/2017   Cocaine abuse (HCC) 01/02/2017   Dysphagia    Adjustment insomnia    Distal radius fracture, right 08/16/2016   Postconcussion syndrome 12/30/2014   Tremor 03/11/2014   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   Hiatal hernia 03/21/2013   Anemia, iron deficiency 01/29/2013   Spasm of muscle 09/02/2012   Anemia 08/28/2012   Mild intermittent asthma 08/27/2012   Chronic back pain    Pernicious anemia 10/30/2011   DEGENERATIVE DISC DISEASE 08/18/2010   Asthma with bronchitis 07/28/2010   Recurrent urticaria 07/28/2010     Conditions to be addressed/monitored: DMI, Stiff-Person Syndrome, Asthma with bronchitis, GERD, Depression, Hashimoto's disease  Care Plan : RN Care Manager Plan of Care  Updates made by Riley ChurchesLittle, Eulan Heyward L, RN since 01/11/2022 12:00 AM     Problem: No plan of care established for management of chronic disease states (DMI, Stiff-Person Syndrome, Asthma with bronchitis, GERD, Depression, Hashimoto's disease)   Priority: High     Long-Range Goal: Establishment of plan of care for management of chronic disease states (DMI, Stiff-Person Syndrome, Asthma with bronchitis, GERD, Depression, Hashimoto's disease)   Start Date: 12/20/2021  Expected End Date: 12/20/2022  Recent Progress: On track  Priority: High  Note:   Current Barriers:  Knowledge Deficits related to plan of care for management of DMI, Stiff-Person Syndrome, Asthma with bronchitis, GERD, Depression, Hashimoto's disease  Chronic Disease Management support and education needs related to DMI, Stiff-Person Syndrome, Asthma with bronchitis, GERD, Depression, Hashimoto's disease   RNCM Clinical Goal(s):  Patient will verbalize basic understanding of  DMI, Stiff-Person Syndrome, Asthma with bronchitis, GERD, Depression, Hashimoto's disease disease process and self health management plan as evidenced by patient will report having no disease exacerbation related to her chronic disease states as listed above take all medications exactly as prescribed and will call provider for medication related questions as evidenced by patient will report having no missed doses of her prescribed medications  demonstrate Improved health management independence as evidenced by patient will report 100% adherence to her prescribed treatment plan  continue to work with RN Care Manager to address care management and care coordination needs related to  DMI, Stiff-Person Syndrome, Asthma with bronchitis, GERD, Depression, Hashimoto's disease as evidenced by  adherence to CM Team Scheduled appointments demonstrate ongoing self health care management ability   as evidenced by    through collaboration with RN Care manager, provider, and care team.   Interventions: 1:1 collaboration with primary care provider regarding development and update of comprehensive plan of care as evidenced by provider attestation and co-signature Inter-disciplinary care team collaboration (see longitudinal plan of care) Evaluation of current treatment plan related to  self management and patient's adherence to plan as established by provider  Diabetes Interventions:  (Status:  Goal on track:  Yes.) Long Term Goal Assessed patient's understanding of A1c goal: <7% Received inbound voice message from patient stating she experienced a hyperglycemic event Placed successful outbound call with patient  Determined patient experienced a hyperglycemic event on 01/06/22 for which she used her sliding insulin scale and contacted her Endocrinologist  Determined patient has since returned to lower reading's with her highest at 213 and lowest at 70, she did not feel symptomatic, she followed her sliding insulin scale and 15" rule to stabilize her cbg's Reviewed medications with patient and discussed importance of medication adherence Reviewed scheduled/upcoming provider appointments including: next Endocrinology follow up scheduled for 02/09/22 @10 :50 AM Discussed plans with patient for ongoing care management follow up and provided patient with direct contact information for care management team Lab Results  Component Value Date   HGBA1C 13.7 (A) 11/09/2021   Altered Mental Status:  (Status:  Condition stable.  Not addressed this visit.)  Long Term Goal Evaluation of current treatment plan related to  Altered Mental Status , self-management and patient's adherence to plan as established by provider Review of patient status, including review of consultant's reports, relevant laboratory and  other test results, and medications completed Reviewed medications with patient and discussed importance of medication adherence Reviewed scheduled/upcoming provider appointments including: follow up with PCP provider scheduled for 12/28/21 @3 :00 PM, [facility], Dr. 02/25/22 scheduled for 12/29/21 @2 :00 PM, confirmed patient will drive herself to appointments  Discussed plans with patient for ongoing care management follow up and provided patient with direct contact information for care management team  Frailty Syndrome:  (Status:  Condition stable.  Not addressed this visit.)  Long Term Goal Evaluation of current treatment plan related to  frailty syndrome , self-management and patient's adherence to plan as established by provider Determined patient's nutritional status is improving as evidence by patient is eating more regularly and is gaining weight Reviewed and discussed nutritional and diabetes follow up:   ASSESSMENT Patient is here today alone.  She was last seen by an RD 01/21/2018 at [facility]. She would like to learn better eating habits to better  manage her blood glucose.   History includes:  Type 1 diabetes with neuropathy, depression, stiff person syndrome, CKD, history of cocaine abuse, depression, hashimoto's disease, pernicious anemia Medications include:  Trisiba 13 units q am, Lyumjev 6 units plus sliding scale before meals, vitamin B-12, Cranberri/vitamin C/vitamin E, Vitamin D, MVI No Dexcom as this is too expensive. A1C 13.7% 11/07/2021 increased from 12.5% 09/08/2021   Weight hx: 63" 108 lbs 11/10/2021 105 lbs 9/16/202 175 lbs about 10 years ago.  Purposefully lost weight due to diabetes diagnosis.    Patient walks with a cane. She speaks to a Child psychotherapistocial Worker once a week related to her diabetes. She is attending [facility] out patient. She is currently in school, child development and psychology. Gym at her apartment complex.   Weight 108 lb (49 kg), last menstrual period  07/29/2012. Body mass index is 19.13 kg/m   Determined patient reports current weight is up to 111 lbs  Reinforced importance of nutritional adherence as directed by Registered dietician  Discussed plans with patient for ongoing care management follow up and provided patient with direct contact information for care management team  Patient Goals/Self-Care Activities: Take all medications as prescribed Attend all scheduled provider appointments Call pharmacy for medication refills 3-7 days in advance of running out of medications Perform all self care activities independently  Perform IADL's (shopping, preparing meals, housekeeping, managing finances) independently Call provider office for new concerns or questions  Work with the social worker to address care coordination needs and will continue to work with the clinical team to address health care and disease management related needs drink 6 to 8 glasses of water each day fill half of plate with vegetables manage portion size  Follow Up Plan:  Telephone follow up appointment with care management team member scheduled for:  02/13/22      Plan:Telephone follow up appointment with care management team member scheduled for:  02/13/22  Delsa SaleAngel Kelsea Mousel, RN, BSN, CCM Care Management Coordinator [facility]  Direct Phone: [phone]

## 2022-01-11 NOTE — Patient Instructions (Signed)
Visit Information  Thank you for taking time to visit with me today. Please don't hesitate to contact me if I can be of assistance to you before our next scheduled telephone appointment.  Following are the goals we discussed today:  (Copy and paste patient goals from clinical care plan here)  Our next appointment is by telephone on 02/13/22 at 12:35 PM   Please call the care guide team at [phone] if you need to cancel or reschedule your appointment.   If you are experiencing a Mental Health or Behavioral Health Crisis or need someone to talk to, please call 1-800-273-TALK (toll free, 24 hour hotline)   Patient verbalizes understanding of instructions and care plan provided today and agrees to view in MyChart. Active MyChart status confirmed with patient.     Delsa Sale, RN, BSN, CCM Care Management Coordinator [facility] Care Management/[facility]  Direct Phone: [phone]

## 2022-01-16 ENCOUNTER — Telehealth: Payer: Self-pay | Admitting: Internal Medicine

## 2022-01-16 NOTE — Telephone Encounter (Signed)
Pt is calling in to see if the office has any samples of insulin degludec (TRESIBA) 100 UNIT/ML (pt stated that she is out and was unable to take the full 13 units of medication).

## 2022-01-16 NOTE — Telephone Encounter (Signed)
Patient advise that sample is ready for the [place] in the Fridge

## 2022-01-16 NOTE — Telephone Encounter (Signed)
Patient picked up [place].

## 2022-01-17 ENCOUNTER — Telehealth: Payer: Medicare Other

## 2022-01-20 ENCOUNTER — Telehealth: Payer: Self-pay

## 2022-01-20 NOTE — Chronic Care Management (AMB) (Signed)
Chronic Care Management Pharmacy Assistant   Name: Sherri [name]  MRN: [id number] DOB: 20-Mar-1966  Reason for Encounter: Disease State/ Diabetes  Recent office visits:  01-11-2022 [name], RN (CCM)  01-02-2022 Little, [name], RN (CCM)  12-27-2021 [name] (CCM)  12-20-2021 Little, [name], RN (CCM)  12-09-2021  [name] (CCM)  11-09-2021 [name] (CCM)  Recent consult visits:  11-10-2021 Bonnita Levan, RD (Nutrition/Diabetes) Plan: "Aim for 3-4 Carb Choices per meal (45-60 grams) +/- 1 either way  Aim for 0-1 Carbs per snack if hungry  Include protein in moderation with your meals and snacks Consider reading food labels for Total Carbohydrate of foods Consider  increasing your activity level by walking or gym for at least 30 minutes daily as tolerated Continue checking BG at alternate times per day  Continue taking medication as directed by MD".  11-09-2022 [name], [name], MD (Endocrinology). STOP farxiga. START Novolog 30 units daily. Referral placed to nutrition and diabetic education. A1C= 13.7.  Hospital visits:  Medication Reconciliation was completed by comparing discharge summary, patients EMR and Pharmacy list, and upon discussion with patient.  Admitted to the hospital on 11-29-2021 due to Encounter for screening for COVID-19  Discharge date was 11-29-2021. Discharged from [facility] [place]. Positive covid test.  New?Medications Started at [facility] Discharge:?? Azithromycin 250 mg 2 tabs PO x 1 dose, then 1 tab PO QD x 4 days Flonase nasal spray 2 sprays into both nostrils daily  Medication Changes at Hospital Discharge: None  Medications Discontinued at Hospital Discharge: None  Medications that remain the same after Hospital Discharge:??  -All other medications will remain the same.    Medications: Outpatient Encounter Medications as of 01/20/2022  Medication Sig Note   acetaminophen  (TYLENOL) 500 MG tablet Take 1 tablet (500 mg total) by mouth every 6 (six) hours as needed.    albuterol (VENTOLIN HFA) 108 (90 Base) MCG/ACT inhaler TAKE 2 PUFFS BY MOUTH EVERY 6 HOURS AS NEEDED FOR WHEEZE OR SHORTNESS OF BREATH (Patient taking differently: Inhale 2 puffs into the lungs every 6 (six) hours as needed for wheezing or shortness of breath.)    Ascorbic Acid (VITAMIN C) 1000 MG tablet TAKE 1 TABLET BY MOUTH EVERY DAY    azithromycin (ZITHROMAX) 250 MG tablet Take 2 tabs PO x 1 dose, then 1 tab PO QD x 4 days    baclofen (LIORESAL) 20 MG tablet Take 1 tablet 3 times daily    Cholecalciferol (VITAMIN D3) 10 MCG (400 UNIT) tablet Take 400 Units by mouth daily.    Continuous Blood Gluc Sensor (DEXCOM G6 SENSOR) MISC 1 Device by Does not apply route as directed.    Continuous Blood Gluc Transmit (DEXCOM G6 TRANSMITTER) MISC 1 Device by Does not apply route as directed.    Cranberry-Vitamin C-Vitamin E 4200-20-3 MG-MG-UNIT CAPS Take 1 tablet by mouth daily.    cyanocobalamin (,VITAMIN B-12,) 1000 MCG/ML injection Inject 1,000 mcg into the muscle every 3 (three) months.    desloratadine (CLARINEX) 5 MG tablet TAKE 1 TABLET BY MOUTH  DAILY    diazepam (VALIUM) 10 MG tablet Take 2 tablets (20 mg total) by mouth in the morning, at noon, and at bedtime. Home med.    diclofenac Sodium (VOLTAREN) 1 % GEL Apply 2 g topically 4 (four) times daily. 09/08/2021: Hasn't started   doxycycline (VIBRAMYCIN) 100 MG capsule Take 1 capsule (100 mg total) by mouth 2 (two) times daily. 09/08/2021:  ABT Start Date 09/05/21. 7 Day Course   EPINEPHRINE 0.3 mg/0.3 mL IJ SOAJ injection INJECT INTRAMUSCULARLY 1  PEN AS NEEDED FOR ALLERGIC  RESPONSE AS DIRECTED BY MD. Assunta Found MEDICAL HELP AFTER  USE.    famotidine (PEPCID) 20 MG tablet TAKE 1 TABLET BY MOUTH TWICE A DAY    FLUoxetine (PROZAC) 10 MG capsule Take 1 capsule (10 mg total) by mouth daily. Please call and make overdue follow up appt. 1st attempt    fluticasone  (FLONASE) 50 MCG/ACT nasal spray Place 2 sprays into both nostrils daily.    gabapentin (NEURONTIN) 300 MG capsule TAKE 1 CAPSULE BY MOUTH  TWICE DAILY    Glucagon (GVOKE HYPOPEN 2-PACK) 0.5 MG/0.1ML SOAJ Inject 0.1 mLs into the skin as needed (if blood sugar less than 70). (Patient not taking: Reported on 11/10/2021)    glucose blood test strip Use as instructed to check sugars qac and qhs    hydrOXYzine (VISTARIL) 100 MG capsule TAKE 1 CAPSULE BY MOUTH 3  TIMES DAILY AS NEEDED FOR  ITCHING (Patient taking differently: Take 100 mg by mouth 3 (three) times daily as needed for itching.)    insulin aspart (NOVOLOG FLEXPEN) 100 UNIT/ML FlexPen Max daily 30 units (Patient not taking: Reported on 11/10/2021)    insulin degludec (TRESIBA) 100 UNIT/ML FlexTouch Pen Inject 13 Units into the skin daily.    Insulin Pen Needle 32G X 4 MM MISC 1 each by Does not apply route 2 (two) times daily. Dx E11.9    Multiple Vitamin (MULTIVITAMIN WITH MINERALS) TABS tablet Take 1 tablet by mouth daily.    vitamin E 180 MG (400 UNITS) capsule Take 400 Units by mouth daily.     No facility-administered encounter medications on file as of 01/20/2022.   Recent Relevant Labs: Lab Results  Component Value Date/Time   HGBA1C 13.7 (A) 11/09/2021 02:04 PM   HGBA1C 12.5 (H) 09/08/2021 03:30 PM   HGBA1C 12.0 (A) 08/08/2021 10:08 AM   HGBA1C 13.0 (H) 05/06/2021 05:41 AM   MICROALBUR 30mg  03/22/2021 11:39 AM   MICROALBUR 30 06/04/2020 12:29 PM    Kidney Function Lab Results  Component Value Date/Time   CREATININE 0.89 09/11/2021 05:58 AM   CREATININE 1.08 (H) 09/10/2021 05:45 AM   CREATININE 1.19 (H) 08/09/2021 02:32 PM   CREATININE 0.99 11/16/2020 02:47 PM   CREATININE 1.0 11/23/2017 11:47 AM   CREATININE 1.0 07/23/2017 11:30 AM   CREATININE 1.0 03/22/2017 09:43 AM   CREATININE 1.2 (H) 08/24/2016 10:04 AM   GFR 55.40 (L) 08/08/2021 10:58 AM   GFRNONAA >60 09/11/2021 05:58 AM   GFRNONAA 54 (L) 08/09/2021 02:32 PM    GFRAA 59 (L) 01/25/2021 05:01 PM   GFRAA >60 07/27/2020 01:16 PM    01-20-2022: 1st attempt left VM 01-23-2022: 2nd attempt left VM 01-24-2022: 3rd attempt left VM  Care Gaps: Covid vaccine overdue Yearly ophthalmology exam overdue Shingrix overdue  Star Rating Drugs: None  01-26-2022 [facility] Clinical Pharmacist Assistant [phone]

## 2022-01-24 DIAGNOSIS — J45909 Unspecified asthma, uncomplicated: Secondary | ICD-10-CM

## 2022-01-24 DIAGNOSIS — F32A Depression, unspecified: Secondary | ICD-10-CM

## 2022-01-24 DIAGNOSIS — E1021 Type 1 diabetes mellitus with diabetic nephropathy: Secondary | ICD-10-CM

## 2022-01-30 ENCOUNTER — Telehealth: Payer: Self-pay

## 2022-01-30 ENCOUNTER — Telehealth: Payer: Medicare Other

## 2022-01-30 NOTE — Telephone Encounter (Signed)
°  Care Management   Follow Up Note   01/30/2022 Name: Sherri [name] MRN: [id number] DOB: Feb 22, 1966   Referred by: Arnette Felts, FNP Reason for referral : Chronic Care Management (Unsuccessful call)   An unsuccessful telephone outreach was attempted today. The patient was referred to the case management team for assistance with care management and care coordination. SW left a HIPAA compliant voice message requesting a return call.  Follow Up Plan: The care management team will reach out to the patient again over the next 21 days.   [name], BSW, CDP Social Worker, Certified Dementia Practitioner [facility] Care Management [phone]

## 2022-02-07 ENCOUNTER — Telehealth: Payer: Self-pay

## 2022-02-07 ENCOUNTER — Telehealth: Payer: Medicare Other

## 2022-02-07 NOTE — Telephone Encounter (Signed)
°  Care Management   Follow Up Note   02/07/2022 Name: Arine Foley MRN: [id number] DOB: January 17, 1966   Referred by: Arnette Felts, FNP Reason for referral : Chronic Care Management (Unsuccessful call)   A second unsuccessful telephone outreach was attempted today. The patient was referred to the case management team for assistance with care management and care coordination.   Follow Up Plan: The care management team will reach out to the patient again over the next 21 days.   [name], BSW, CDP Social Worker, Certified Dementia Practitioner [facility] Care Management [phone]

## 2022-02-09 ENCOUNTER — Ambulatory Visit: Payer: Medicare Other | Admitting: Internal Medicine

## 2022-02-09 ENCOUNTER — Ambulatory Visit: Payer: Medicare Other | Admitting: Dietician

## 2022-02-10 ENCOUNTER — Other Ambulatory Visit: Payer: Medicare Other

## 2022-02-10 ENCOUNTER — Telehealth: Payer: Self-pay

## 2022-02-10 ENCOUNTER — Ambulatory Visit: Payer: Medicare Other | Admitting: Family

## 2022-02-10 NOTE — Chronic Care Management (AMB) (Signed)
Chronic Care Management Pharmacy Assistant   Name: Sherri [name]  MRN: [id number] DOB: October 23, 1966  Reason for Encounter: Disease State/ Diabetes  Recent office visits:  01-11-2022 [name], RN (CCM)  01-02-2022 Little, [name], RN (CCM)  12-27-2021 [name] (CCM)  12-20-2021 Little, [name], RN (CCM)  12-09-2021 [name] (CCM)  11-10-2021 [name] (CCM)  Recent consult visits:  11-10-2021 11-10-2021 Bonnita Levan, RD (Nutrition/Diabetes) Plan: "Aim for 3-4 Carb Choices per meal (45-60 grams) +/- 1 either way  Aim for 0-1 Carbs per snack if hungry  Include protein in moderation with your meals and snacks Consider reading food labels for Total Carbohydrate of foods Consider  increasing your activity level by walking or gym for at least 30 minutes daily as tolerated Continue checking BG at alternate times per day  Continue taking medication as directed by MD".   11-09-2022 [name], [name], MD (Endocrinology). STOP farxiga. START Novolog 30 units daily. Referral placed to nutrition and diabetic education. A1C= 13.7.  Hospital visits:  Medication Reconciliation was completed by comparing discharge summary, patients EMR and Pharmacy list, and upon discussion with patient.  Admitted to the hospital on 11-29-2021 due to Screening for covid. Discharge date was 11-29-2021. Discharged from [facility] urgent care.    New?Medications Started at [facility] Discharge:?? Zithromax 250 mg Take 2 tabs PO x 1 dose, then 1 tab PO QD x 4 days Flonase nasal spray 2 sprays in both nostrils daily.  Medication Changes at Hospital Discharge: None  Medications Discontinued at Hospital Discharge: None  Medications that remain the same after Hospital Discharge:??  -All other medications will remain the same.    Medications: Outpatient Encounter Medications as of 02/10/2022  Medication Sig Note   acetaminophen (TYLENOL) 500 MG tablet Take 1 tablet  (500 mg total) by mouth every 6 (six) hours as needed.    albuterol (VENTOLIN HFA) 108 (90 Base) MCG/ACT inhaler TAKE 2 PUFFS BY MOUTH EVERY 6 HOURS AS NEEDED FOR WHEEZE OR SHORTNESS OF BREATH (Patient taking differently: Inhale 2 puffs into the lungs every 6 (six) hours as needed for wheezing or shortness of breath.)    Ascorbic Acid (VITAMIN C) 1000 MG tablet TAKE 1 TABLET BY MOUTH EVERY DAY    azithromycin (ZITHROMAX) 250 MG tablet Take 2 tabs PO x 1 dose, then 1 tab PO QD x 4 days    baclofen (LIORESAL) 20 MG tablet Take 1 tablet 3 times daily    Cholecalciferol (VITAMIN D3) 10 MCG (400 UNIT) tablet Take 400 Units by mouth daily.    Continuous Blood Gluc Sensor (DEXCOM G6 SENSOR) MISC 1 Device by Does not apply route as directed.    Continuous Blood Gluc Transmit (DEXCOM G6 TRANSMITTER) MISC 1 Device by Does not apply route as directed.    Cranberry-Vitamin C-Vitamin E 4200-20-3 MG-MG-UNIT CAPS Take 1 tablet by mouth daily.    cyanocobalamin (,VITAMIN B-12,) 1000 MCG/ML injection Inject 1,000 mcg into the muscle every 3 (three) months.    desloratadine (CLARINEX) 5 MG tablet TAKE 1 TABLET BY MOUTH  DAILY    diazepam (VALIUM) 10 MG tablet Take 2 tablets (20 mg total) by mouth in the morning, at noon, and at bedtime. Home med.    diclofenac Sodium (VOLTAREN) 1 % GEL Apply 2 g topically 4 (four) times daily. 09/08/2021: Hasn't started   doxycycline (VIBRAMYCIN) 100 MG capsule Take 1 capsule (100 mg total) by mouth 2 (two) times daily. 09/08/2021: ABT Start Date 09/05/21.  7 Day Course   EPINEPHRINE 0.3 mg/0.3 mL IJ SOAJ injection INJECT INTRAMUSCULARLY 1  PEN AS NEEDED FOR ALLERGIC  RESPONSE AS DIRECTED BY MD. Assunta Found MEDICAL HELP AFTER  USE.    famotidine (PEPCID) 20 MG tablet TAKE 1 TABLET BY MOUTH TWICE A DAY    FLUoxetine (PROZAC) 10 MG capsule Take 1 capsule (10 mg total) by mouth daily. Please call and make overdue follow up appt. 1st attempt    fluticasone (FLONASE) 50 MCG/ACT nasal spray Place 2  sprays into both nostrils daily.    gabapentin (NEURONTIN) 300 MG capsule TAKE 1 CAPSULE BY MOUTH  TWICE DAILY    Glucagon (GVOKE HYPOPEN 2-PACK) 0.5 MG/0.1ML SOAJ Inject 0.1 mLs into the skin as needed (if blood sugar less than 70). (Patient not taking: Reported on 11/10/2021)    glucose blood test strip Use as instructed to check sugars qac and qhs    hydrOXYzine (VISTARIL) 100 MG capsule TAKE 1 CAPSULE BY MOUTH 3  TIMES DAILY AS NEEDED FOR  ITCHING (Patient taking differently: Take 100 mg by mouth 3 (three) times daily as needed for itching.)    insulin aspart (NOVOLOG FLEXPEN) 100 UNIT/ML FlexPen Max daily 30 units (Patient not taking: Reported on 11/10/2021)    insulin degludec (TRESIBA) 100 UNIT/ML FlexTouch Pen Inject 13 Units into the skin daily.    Insulin Pen Needle 32G X 4 MM MISC 1 each by Does not apply route 2 (two) times daily. Dx E11.9    Multiple Vitamin (MULTIVITAMIN WITH MINERALS) TABS tablet Take 1 tablet by mouth daily.    vitamin E 180 MG (400 UNITS) capsule Take 400 Units by mouth daily.     No facility-administered encounter medications on file as of 02/10/2022.  Recent Relevant Labs: Lab Results  Component Value Date/Time   HGBA1C 13.7 (A) 11/09/2021 02:04 PM   HGBA1C 12.5 (H) 09/08/2021 03:30 PM   HGBA1C 12.0 (A) 08/08/2021 10:08 AM   HGBA1C 13.0 (H) 05/06/2021 05:41 AM   MICROALBUR 30mg  03/22/2021 11:39 AM   MICROALBUR 30 06/04/2020 12:29 PM    Kidney Function Lab Results  Component Value Date/Time   CREATININE 0.89 09/11/2021 05:58 AM   CREATININE 1.08 (H) 09/10/2021 05:45 AM   CREATININE 1.19 (H) 08/09/2021 02:32 PM   CREATININE 0.99 11/16/2020 02:47 PM   CREATININE 1.0 11/23/2017 11:47 AM   CREATININE 1.0 07/23/2017 11:30 AM   CREATININE 1.0 03/22/2017 09:43 AM   CREATININE 1.2 (H) 08/24/2016 10:04 AM   GFR 55.40 (L) 08/08/2021 10:58 AM   GFRNONAA >60 09/11/2021 05:58 AM   GFRNONAA 54 (L) 08/09/2021 02:32 PM   GFRAA 59 (L) 01/25/2021 05:01 PM   GFRAA  >60 07/27/2020 01:16 PM    02-10-2022: 1st attempt left VM 02-14-2022: 2nd attempt left VM 02-15-2022: 3rd attempt left VM  Care Gaps: Covid vaccine overdue Yearly ophthalmology exam overdue Shingrix overdue Yearly foot exam overdue AWV 08-17-2022  Star Rating Drugs: None  [place] [facility] Clinical Pharmacist Assistant [phone]

## 2022-02-13 ENCOUNTER — Telehealth: Payer: Medicare Other

## 2022-02-13 ENCOUNTER — Telehealth: Payer: Self-pay

## 2022-02-13 NOTE — Telephone Encounter (Signed)
°  Care Management  ° °Follow Up Note ° ° °02/13/2022 °Name: Sherri [name] MRN: [id number] DOB: 01/24/1966 ° ° °Referred by: Moore, Janece, FNP °Reason for referral : Chronic Care Management (RN CM Follow up call ) ° ° °An unsuccessful telephone outreach was attempted today. The patient was referred to the case management team for assistance with care management and care coordination.  ° °Follow Up Plan: A HIPPA compliant phone message was left for the patient providing contact information and requesting a return call.  ° °Rushton Early, RN, BSN, CCM °Care Management Coordinator °[facility] Care Management/[facility]  °Direct Phone: [phone] ° ° °

## 2022-02-13 NOTE — Telephone Encounter (Signed)
°  Care Management   Follow Up Note   02/13/2022 Name: Beola Volmar MRN: [id number] DOB: Nov 09, 1966   Referred by: Minette Brine, FNP Reason for referral : Chronic Care Management (RN CM Follow up call )   An unsuccessful telephone outreach was attempted today. The patient was referred to the case management team for assistance with care management and care coordination.   Follow Up Plan: A HIPPA compliant phone message was left for the patient providing contact information and requesting a return call.   Barb Merino, RN, BSN, CCM Care Management Coordinator [place] Management/[facility]  Direct Phone: [phone]

## 2022-02-16 ENCOUNTER — Telehealth: Payer: Self-pay

## 2022-02-16 DIAGNOSIS — Z20822 Contact with and (suspected) exposure to covid-19: Secondary | ICD-10-CM | POA: Diagnosis not present

## 2022-02-16 NOTE — Chronic Care Management (AMB) (Signed)
02/16/2022- Faxing new prescripton order form back to [facility] regarding patient's Wilder Glade noting that medication was discontinued on 11/09/2021 and to please cancel enrollment.  Pattricia Boss, [place] Pharmacist Assistant [phone]'

## 2022-02-17 ENCOUNTER — Inpatient Hospital Stay: Payer: Medicare Other

## 2022-02-17 ENCOUNTER — Inpatient Hospital Stay: Payer: Medicare Other | Admitting: Family

## 2022-02-20 ENCOUNTER — Telehealth: Payer: Medicare Other

## 2022-02-20 ENCOUNTER — Telehealth: Payer: Self-pay

## 2022-02-20 NOTE — Telephone Encounter (Signed)
°  Care Management   Follow Up Note   02/20/2022 Name: Sherri [name] MRN: [id number] DOB: 1966-10-14   Referred by: Arnette Felts, FNP Reason for referral : Chronic Care Management (Unsuccessful call)  SW placed a third unsuccessful outbound call to the patient to assess for care coordination needs. SW left  HIPAA compliant voice message requesting a return call.   Follow Up Plan: The care management team will reach out to the patient again over the next 45 days.   [name], BSW, CDP Social Worker, Certified Dementia Practitioner [facility] Care Management [phone]

## 2022-02-22 ENCOUNTER — Ambulatory Visit: Payer: Medicare Other | Admitting: Dermatology

## 2022-02-27 ENCOUNTER — Telehealth: Payer: Self-pay

## 2022-02-27 ENCOUNTER — Telehealth: Payer: Medicare Other

## 2022-02-27 NOTE — Telephone Encounter (Signed)
?  Care Management  ? ?Follow Up Note ? ? ?02/27/2022 ?Name: Chasey Dull MRN: [id number] DOB: [birth year] ? ? ?Referred by: Arnette Felts, FNP ?Reason for referral : Chronic Care Management (RN CM Follow up call - #2 attempt) ? ? ?A second unsuccessful telephone outreach was attempted today. The patient was referred to the case management team for assistance with care management and care coordination.  ? ?Follow Up Plan: A HIPPA compliant phone message was left for the patient providing contact information and requesting a return call.  ? ?Delsa Sale, RN, BSN, CCM ?Care Management Coordinator ?[facility] Care Management/[facility]  ?Direct Phone: [phone] ? ? ?

## 2022-02-27 NOTE — Telephone Encounter (Signed)
?  Care Management  ? ?Follow Up Note ? ? ?02/27/2022 ?Name: Sherri [name] MRN: [id number] DOB: [birth year] ? ? ?Referred by: Minette Brine, FNP ?Reason for referral : Chronic Care Management (Inbound call from patient ) ? ?Inbound call received from patient stating she is returning my call.  ?An unsuccessful telephone outreach was attempted today. The patient was referred to the case management team for assistance with care management and care coordination.  ? ?Follow Up Plan: A HIPPA compliant phone message was left for the patient providing contact information and requesting a return call.  ? ?Barb Merino, RN, BSN, CCM ?Care Management Coordinator ?[place] Management/[facility]  ?Direct Phone: [phone] ? ? ?

## 2022-03-06 ENCOUNTER — Ambulatory Visit: Payer: Medicare Other | Admitting: Dermatology

## 2022-03-07 ENCOUNTER — Other Ambulatory Visit: Payer: Self-pay

## 2022-03-07 ENCOUNTER — Ambulatory Visit (INDEPENDENT_AMBULATORY_CARE_PROVIDER_SITE_OTHER): Payer: Medicare Other | Admitting: Internal Medicine

## 2022-03-07 VITALS — BP 108/70 | HR 95 | Ht 63.0 in | Wt 112.0 lb

## 2022-03-07 DIAGNOSIS — E1021 Type 1 diabetes mellitus with diabetic nephropathy: Secondary | ICD-10-CM

## 2022-03-07 DIAGNOSIS — E1065 Type 1 diabetes mellitus with hyperglycemia: Secondary | ICD-10-CM

## 2022-03-07 DIAGNOSIS — E063 Autoimmune thyroiditis: Secondary | ICD-10-CM | POA: Diagnosis not present

## 2022-03-07 LAB — POCT GLYCOSYLATED HEMOGLOBIN (HGB A1C): Hemoglobin A1C: 11.3 % — AB (ref 4.0–5.6)

## 2022-03-07 MED ORDER — INSULIN DEGLUDEC 100 UNIT/ML ~~LOC~~ SOPN: 10.0000 [IU] | PEN_INJECTOR | Freq: Every day | SUBCUTANEOUS | 3 refills | Status: DC

## 2022-03-07 MED ORDER — FREESTYLE LIBRE 2 SENSOR MISC: 1.0000 | 3 refills | Status: DC

## 2022-03-07 NOTE — Patient Instructions (Addendum)
?-   Decrease Tresiba 10 units daily  ?- Continue Novolog  6 units with each meal  ?- Continue Novolog correctional insulin: ADD extra units on insulin to your meal-time Novolog  dose if your blood sugars are higher than 180. Use the scale below to help guide you:  ? ?Blood sugar before meal Number of units to inject  ?Less than 180 0 unit  ?181 -  230 1 units  ?231 -  280 2 units  ?281 -  330 3 units  ?331 -  380 4 units  ?381 -  430 5 units  ?430 -  480 6 units  ?481 -  530 7 units  ?531 -  580 8 units  ? ? ? ? ? ? ?HOW TO TREAT LOW BLOOD SUGARS (Blood sugar LESS THAN 70 MG/DL) ?Please follow the RULE OF 15 for the treatment of hypoglycemia treatment (when your (blood sugars are less than 70 mg/dL)  ? ?STEP 1: Take 15 grams of carbohydrates when your blood sugar is low, which includes:  ?3-4 GLUCOSE TABS  OR ?3-4 OZ OF JUICE OR REGULAR SODA OR ?ONE TUBE OF GLUCOSE GEL   ? ?STEP 2: RECHECK blood sugar in 15 MINUTES ?STEP 3: If your blood sugar is still low at the 15 minute recheck --> then, go back to STEP 1 and treat AGAIN with another 15 grams of carbohydrates. ? ?

## 2022-03-07 NOTE — Progress Notes (Signed)
? ?Name: Sherri [name] Desantiago  ?MRN/ DOB: [id number], 1966-05-27   ?Age/ Sex: 56 y.o., female   ? ?PCP: Arnette FeltsMoore, Janece, FNP   ?Reason for Endocrinology Evaluation: Type 2 Diabetes Mellitus  ?   ?Date of Initial Endocrinology Visit: 08/08/2021  ? ? ?PATIENT IDENTIFIER: Sherri [name] is a 56 y.o. female with a past medical history of T2DM, CKD, hx of coccaine abuse, depression, DVT . The patient presented for initial endocrinology clinic visit on 08/08/2021 for consultative assistance with her diabetes management.  ? ? ? ?DIABETIC HISTORY:  ?Ms. Stenberg was diagnosed with T2DM in 2021, she was initially started on oral glycemic agents, but subsequently required insulin due to persistent hyperglycemia.Her hemoglobin A1c has ranged from  6.4% in 2021, peaking at 13.0% in 2022. ? ?On her initial visit to our clinic she had an A1c of 12%, she was on Farxiga, metformin, glipizide, Tresiba, and metformin.  We stopped metformin and glipizide, continued [place]iba and Farxiga, and started prandial insulin ? ?Her diagnosis was changed from type 2 DM to type I DM based on elevated islet cell antibody titers at 5120 and GAD-65 > 250 ? ? ?Was diagnosed with stiff person syndrome in 2021, follows with Dr. Anne HahnWillis ?She was also found to have en elevated Anti-TPO Ab's at 37 IU/Ml ( reference 0-34)  ?  ? ? ?SUBJECTIVE:  ? ?During the last visit (10/31/2021): A1c 13.7%, adjusted mdi regimen stopped farxiga ? ? ? ? ?Today (08/08/2021): [name] is here for follow-up on diabetes management.  She checks her blood sugars occasionally . The patient has had hypoglycemic episodes since the last clinic visit. ?Denies nausea, vomiting  ? ? ?HOME DIABETES REGIMEN: ?Tresiba 13 units daily  ?Humalog 6 units with each meal ?Correction factor : Humalog (BG -130/50) ?  ? ? ?Statin: no ?ACE-I/ARB: no ? ? ? ?METER DOWNLOAD SUMMARY: unable to download ?53-420  mg/dL  ? ? ?DIABETIC COMPLICATIONS: ?Microvascular complications:  ?CKD III, neuropathy ?Denies:  retinopathy ?Last eye exam: Completed > 2 yrs ago  ? ?Macrovascular complications:  ? ?Denies: CAD, PVD, CVA ? ? ?PAST HISTORY: ?Past Medical History:  ?Past Medical History:  ?Diagnosis Date  ? Angioedema   ? Anxiety   ? Arthritis   ? Asthma   ? Benzodiazepine withdrawal (HCC) 08/30/2012  ? CAP (community acquired pneumonia) 08/29/2012  ? Chronic back pain   ? Chronic kidney disease   ? Closed nondisplaced fracture of proximal phalanx of right little finger 10/18/2018  ? Cocaine abuse (HCC)   ? Depression   ? Diarrhea   ? DVT (deep venous thrombosis) (HCC)   ? Environmental allergies   ? Fall   ? GERD (gastroesophageal reflux disease)   ? Heart murmur   ? has been told once that she has a heart murmur, but has never had any problems  ? Hiatal hernia 03/21/2013  ? Lumbar herniated disc   ? Peripheral vascular disease (HCC)   ? Pernicious anemia 10/30/2011  ? Postconcussion syndrome 12/30/2014  ? Rhabdomyolysis 08/27/2012  ? Seasonal allergies   ? Stiff person syndrome   ? Urticaria   ? ?Past Surgical History:  ?Past Surgical History:  ?Procedure Laterality Date  ? BUNIONECTOMY Left   ? colonoscopy  07/09/15  ? inguinal hernia 1983  1983  ? OPEN REDUCTION INTERNAL FIXATION (ORIF) DISTAL RADIAL FRACTURE Right 08/16/2016  ? Procedure: OPEN REDUCTION INTERNAL FIXATION (ORIF) DISTAL RADIAL FRACTURE;  Surgeon: Eldred MangesMark C Yates, MD;  Location: [facility] OR;  Service: Orthopedics;  Laterality: Right;  ?  ?Social History:  reports that she has never smoked. She has never used smokeless tobacco. She reports that she does not currently use alcohol. She reports that she does not currently use drugs after having used the following drugs: Cocaine. ?Family History:  ?Family History  ?Problem Relation Age of Onset  ? Cancer Mother   ? Other Mother   ? COPD Father   ? Asthma Brother   ? Cancer Brother   ?     colon  ? Heart attack Brother   ? Seizures Brother   ? Allergic rhinitis Neg Hx   ? Angioedema Neg Hx   ? Eczema Neg Hx   ? Immunodeficiency Neg Hx    ? Urticaria Neg Hx   ? ? ? ?HOME MEDICATIONS: ?Allergies as of 03/07/2022   ? ?   Reactions  ? Ibuprofen Other (See Comments)  ? Does not take due to hx of renal insufficiency ?"I have kidney disease"   ? Lemon Flavor Swelling  ? Severe Lip Swelling  ?FRUIT per pt.    ? Amoxicillin Diarrhea, Other (See Comments)  ? Tylenol [acetaminophen] Hives  ? Cannot take large quantities  ? ?  ? ?  ?Medication List  ?  ? ?  ? Accurate as of March 07, 2022  2:55 PM. If you have any questions, ask your nurse or doctor.  ?  ?  ? ?  ? ?STOP taking these medications   ? ?azithromycin 250 MG tablet ?Commonly known as: ZITHROMAX ?Stopped by: Scarlette Shorts, MD ?  ? ?  ? ?TAKE these medications   ? ?acetaminophen 500 MG tablet ?Commonly known as: TYLENOL ?Take 1 tablet (500 mg total) by mouth every 6 (six) hours as needed. ?  ?albuterol 108 (90 Base) MCG/ACT inhaler ?Commonly known as: VENTOLIN HFA ?TAKE 2 PUFFS BY MOUTH EVERY 6 HOURS AS NEEDED FOR WHEEZE OR SHORTNESS OF BREATH ?What changed: See the new instructions. ?  ?baclofen 20 MG tablet ?Commonly known as: LIORESAL ?Take 1 tablet 3 times daily ?  ?Cranberry-Vitamin C-Vitamin E 4200-20-3 MG-MG-UNIT Caps ?Take 1 tablet by mouth daily. ?  ?cyanocobalamin 1000 MCG/ML injection ?Commonly known as: (VITAMIN B-12) ?Inject 1,000 mcg into the muscle every 3 (three) months. ?  ?desloratadine 5 MG tablet ?Commonly known as: CLARINEX ?TAKE 1 TABLET BY MOUTH  DAILY ?  ?Dexcom G6 Sensor Misc ?1 Device by Does not apply route as directed. ?  ?Dexcom G6 Transmitter Misc ?1 Device by Does not apply route as directed. ?  ?diazepam 10 MG tablet ?Commonly known as: VALIUM ?Take 2 tablets (20 mg total) by mouth in the morning, at noon, and at bedtime. Home med. ?  ?diclofenac Sodium 1 % Gel ?Commonly known as: VOLTAREN ?Apply 2 g topically 4 (four) times daily. ?  ?doxycycline 100 MG capsule ?Commonly known as: VIBRAMYCIN ?Take 1 capsule (100 mg total) by mouth 2 (two) times daily. ?   ?EPINEPHrine 0.3 mg/0.3 mL Soaj injection ?Commonly known as: EPI-PEN ?INJECT INTRAMUSCULARLY 1  PEN AS NEEDED FOR ALLERGIC  RESPONSE AS DIRECTED BY MD. Assunta Found MEDICAL HELP AFTER  USE. ?  ?famotidine 20 MG tablet ?Commonly known as: PEPCID ?TAKE 1 TABLET BY MOUTH TWICE A DAY ?  ?FLUoxetine 10 MG capsule ?Commonly known as: PROZAC ?Take 1 capsule (10 mg total) by mouth daily. Please call and make overdue follow up appt. 1st attempt ?  ?fluticasone 50 MCG/ACT nasal spray ?Commonly known as: FLONASE ?Place 2 sprays  into both nostrils daily. ?  ?gabapentin 300 MG capsule ?Commonly known as: NEURONTIN ?TAKE 1 CAPSULE BY MOUTH  TWICE DAILY ?  ?glucose blood test strip ?Use as instructed to check sugars qac and qhs ?  ?Gvoke HypoPen 2-Pack 0.5 MG/0.1ML Soaj ?Generic drug: Glucagon ?Inject 0.1 mLs into the skin as needed (if blood sugar less than 70). ?  ?hydrOXYzine 100 MG capsule ?Commonly known as: VISTARIL ?TAKE 1 CAPSULE BY MOUTH 3  TIMES DAILY AS NEEDED FOR  ITCHING ?What changed:  ?how much to take ?how to take this ?when to take this ?reasons to take this ?additional instructions ?  ?insulin degludec 100 UNIT/ML FlexTouch Pen ?Commonly known as: TRESIBA ?Inject 13 Units into the skin daily. ?  ?Insulin Pen Needle 32G X 4 MM Misc ?1 each by Does not apply route 2 (two) times daily. Dx E11.9 ?  ?multivitamin with minerals Tabs tablet ?Take 1 tablet by mouth daily. ?  ?NovoLOG FlexPen 100 UNIT/ML FlexPen ?Generic drug: insulin aspart ?Max daily 30 units ?  ?vitamin C 1000 MG tablet ?TAKE 1 TABLET BY MOUTH EVERY DAY ?  ?Vitamin D3 10 MCG (400 UNIT) tablet ?Take 400 Units by mouth daily. ?  ?vitamin E 180 MG (400 UNITS) capsule ?Take 400 Units by mouth daily. ?  ? ?  ? ? ? ?ALLERGIES: ?Allergies  ?Allergen Reactions  ? Ibuprofen Other (See Comments)  ?  Does not take due to hx of renal insufficiency ?"I have kidney disease"   ? Lemon Flavor Swelling  ?  Severe Lip Swelling  ?FRUIT per pt.    ? Amoxicillin Diarrhea and  Other (See Comments)  ? Tylenol [Acetaminophen] Hives  ?  Cannot take large quantities  ? ? ? ?REVIEW OF SYSTEMS: ?A comprehensive ROS was conducted with the patient and is negative except as per HPI ? ?  ?OBJECTIV

## 2022-03-09 ENCOUNTER — Encounter: Payer: Self-pay | Admitting: Family

## 2022-03-09 ENCOUNTER — Other Ambulatory Visit: Payer: Self-pay

## 2022-03-09 ENCOUNTER — Emergency Department (HOSPITAL_BASED_OUTPATIENT_CLINIC_OR_DEPARTMENT_OTHER)
Admission: EM | Admit: 2022-03-09 | Discharge: 2022-03-09 | Disposition: A | Discharge status: Home / Self Care | Payer: Medicare Other | Attending: Emergency Medicine | Admitting: Emergency Medicine

## 2022-03-09 ENCOUNTER — Encounter: Payer: Self-pay | Admitting: *Deleted

## 2022-03-09 ENCOUNTER — Inpatient Hospital Stay (HOSPITAL_BASED_OUTPATIENT_CLINIC_OR_DEPARTMENT_OTHER): Payer: Medicare Other | Admitting: Family

## 2022-03-09 ENCOUNTER — Inpatient Hospital Stay: Payer: Medicare Other | Attending: Internal Medicine

## 2022-03-09 ENCOUNTER — Inpatient Hospital Stay: Payer: Medicare Other

## 2022-03-09 ENCOUNTER — Encounter: Payer: Self-pay | Admitting: Hematology & Oncology

## 2022-03-09 ENCOUNTER — Encounter (HOSPITAL_BASED_OUTPATIENT_CLINIC_OR_DEPARTMENT_OTHER): Payer: Self-pay | Admitting: *Deleted

## 2022-03-09 VITALS — BP 132/88 | HR 80 | Temp 98.1°F | Resp 18 | Ht 63.0 in | Wt 114.4 lb

## 2022-03-09 DIAGNOSIS — Z20822 Contact with and (suspected) exposure to covid-19: Secondary | ICD-10-CM | POA: Insufficient documentation

## 2022-03-09 DIAGNOSIS — D508 Other iron deficiency anemias: Secondary | ICD-10-CM | POA: Diagnosis not present

## 2022-03-09 DIAGNOSIS — D510 Vitamin B12 deficiency anemia due to intrinsic factor deficiency: Secondary | ICD-10-CM | POA: Diagnosis not present

## 2022-03-09 DIAGNOSIS — Z794 Long term (current) use of insulin: Secondary | ICD-10-CM | POA: Diagnosis not present

## 2022-03-09 DIAGNOSIS — N189 Chronic kidney disease, unspecified: Secondary | ICD-10-CM | POA: Insufficient documentation

## 2022-03-09 DIAGNOSIS — E1122 Type 2 diabetes mellitus with diabetic chronic kidney disease: Secondary | ICD-10-CM | POA: Diagnosis not present

## 2022-03-09 DIAGNOSIS — E1165 Type 2 diabetes mellitus with hyperglycemia: Secondary | ICD-10-CM | POA: Insufficient documentation

## 2022-03-09 DIAGNOSIS — E871 Hypo-osmolality and hyponatremia: Secondary | ICD-10-CM | POA: Insufficient documentation

## 2022-03-09 DIAGNOSIS — E860 Dehydration: Secondary | ICD-10-CM | POA: Diagnosis not present

## 2022-03-09 LAB — CMP (CANCER CENTER ONLY)
ALT: 36 U/L (ref 0–44)
AST: 33 U/L (ref 15–41)
Albumin: 4.1 g/dL (ref 3.5–5.0)
Alkaline Phosphatase: 140 U/L — ABNORMAL HIGH (ref 38–126)
Anion gap: 5 (ref 5–15)
BUN: 27 mg/dL — ABNORMAL HIGH (ref 6–20)
CO2: 27 mmol/L (ref 22–32)
Calcium: 9.6 mg/dL (ref 8.9–10.3)
Chloride: 94 mmol/L — ABNORMAL LOW (ref 98–111)
Creatinine: 1.32 mg/dL — ABNORMAL HIGH (ref 0.44–1.00)
GFR, Estimated: 47 mL/min — ABNORMAL LOW (ref >60)
Glucose, Bld: 836 mg/dL (ref 70–99)
Potassium: 5.6 mmol/L — ABNORMAL HIGH (ref 3.5–5.1)
Sodium: 126 mmol/L — ABNORMAL LOW (ref 135–145)
Total Bilirubin: 0.3 mg/dL (ref 0.3–1.2)
Total Protein: 7.4 g/dL (ref 6.5–8.1)

## 2022-03-09 LAB — URINALYSIS, ROUTINE W REFLEX MICROSCOPIC
Bilirubin Urine: NEGATIVE
Glucose, UA: >=500 mg/dL — AB
Hgb urine dipstick: NEGATIVE
Ketones, ur: NEGATIVE mg/dL
Leukocytes,Ua: NEGATIVE
Nitrite: NEGATIVE
Protein, ur: NEGATIVE mg/dL
Specific Gravity, Urine: 1.01 (ref 1.005–1.030)
pH: 5.5 (ref 5.0–8.0)

## 2022-03-09 LAB — COMPREHENSIVE METABOLIC PANEL
ALT: 41 U/L (ref 0–44)
AST: 35 U/L (ref 15–41)
Albumin: 3.9 g/dL (ref 3.5–5.0)
Alkaline Phosphatase: 129 U/L — ABNORMAL HIGH (ref 38–126)
Anion gap: 9 (ref 5–15)
BUN: 25 mg/dL — ABNORMAL HIGH (ref 6–20)
CO2: 24 mmol/L (ref 22–32)
Calcium: 9.3 mg/dL (ref 8.9–10.3)
Chloride: 95 mmol/L — ABNORMAL LOW (ref 98–111)
Creatinine, Ser: 1.22 mg/dL — ABNORMAL HIGH (ref 0.44–1.00)
GFR, Estimated: 52 mL/min — ABNORMAL LOW (ref >60)
Glucose, Bld: 722 mg/dL (ref 70–99)
Potassium: 4.8 mmol/L (ref 3.5–5.1)
Sodium: 128 mmol/L — ABNORMAL LOW (ref 135–145)
Total Bilirubin: 0.4 mg/dL (ref 0.3–1.2)
Total Protein: 7.6 g/dL (ref 6.5–8.1)

## 2022-03-09 LAB — URINALYSIS, MICROSCOPIC (REFLEX)
RBC / HPF: NONE SEEN RBC/hpf (ref 0–5)
WBC, UA: NONE SEEN WBC/hpf (ref 0–5)

## 2022-03-09 LAB — I-STAT VENOUS BLOOD GAS, ED
Acid-Base Excess: 2 mmol/L (ref 0.0–2.0)
Bicarbonate: 27.2 mmol/L (ref 20.0–28.0)
Calcium, Ion: 1.19 mmol/L (ref 1.15–1.40)
HCT: 39 % (ref 36.0–46.0)
Hemoglobin: 13.3 g/dL (ref 12.0–15.0)
O2 Saturation: 85 %
Potassium: 5 mmol/L (ref 3.5–5.1)
Sodium: 129 mmol/L — ABNORMAL LOW (ref 135–145)
TCO2: 28 mmol/L (ref 22–32)
pCO2, Ven: 41.6 mmHg — ABNORMAL LOW (ref 44–60)
pH, Ven: 7.423 (ref 7.25–7.43)
pO2, Ven: 49 mmHg — ABNORMAL HIGH (ref 32–45)

## 2022-03-09 LAB — CBC WITH DIFFERENTIAL (CANCER CENTER ONLY)
Abs Immature Granulocytes: 0.01 10*3/uL (ref 0.00–0.07)
Basophils Absolute: 0 10*3/uL (ref 0.0–0.1)
Basophils Relative: 0 %
Eosinophils Absolute: 0 10*3/uL (ref 0.0–0.5)
Eosinophils Relative: 0 %
HCT: 36.9 % (ref 36.0–46.0)
Hemoglobin: 11.7 g/dL — ABNORMAL LOW (ref 12.0–15.0)
Immature Granulocytes: 0 %
Lymphocytes Relative: 25 %
Lymphs Abs: 1.2 10*3/uL (ref 0.7–4.0)
MCH: 28.2 pg (ref 26.0–34.0)
MCHC: 31.7 g/dL (ref 30.0–36.0)
MCV: 88.9 fL (ref 80.0–100.0)
Monocytes Absolute: 0.2 10*3/uL (ref 0.1–1.0)
Monocytes Relative: 4 %
Neutro Abs: 3.4 10*3/uL (ref 1.7–7.7)
Neutrophils Relative %: 71 %
Platelet Count: 212 10*3/uL (ref 150–400)
RBC: 4.15 MIL/uL (ref 3.87–5.11)
RDW: 12.9 % (ref 11.5–15.5)
WBC Count: 4.9 10*3/uL (ref 4.0–10.5)
nRBC: 0 % (ref 0.0–0.2)

## 2022-03-09 LAB — RETICULOCYTES
Immature Retic Fract: 9.4 % (ref 2.3–15.9)
RBC.: 4.12 MIL/uL (ref 3.87–5.11)
Retic Count, Absolute: 72.1 10*3/uL (ref 19.0–186.0)
Retic Ct Pct: 1.8 % (ref 0.4–3.1)

## 2022-03-09 LAB — CBC WITH DIFFERENTIAL/PLATELET
Abs Immature Granulocytes: 0.01 10*3/uL (ref 0.00–0.07)
Basophils Absolute: 0 10*3/uL (ref 0.0–0.1)
Basophils Relative: 0 %
Eosinophils Absolute: 0 10*3/uL (ref 0.0–0.5)
Eosinophils Relative: 0 %
HCT: 36 % (ref 36.0–46.0)
Hemoglobin: 12 g/dL (ref 12.0–15.0)
Immature Granulocytes: 0 %
Lymphocytes Relative: 26 %
Lymphs Abs: 1.3 10*3/uL (ref 0.7–4.0)
MCH: 28.4 pg (ref 26.0–34.0)
MCHC: 33.3 g/dL (ref 30.0–36.0)
MCV: 85.1 fL (ref 80.0–100.0)
Monocytes Absolute: 0.2 10*3/uL (ref 0.1–1.0)
Monocytes Relative: 5 %
Neutro Abs: 3.4 10*3/uL (ref 1.7–7.7)
Neutrophils Relative %: 69 %
Platelets: 229 10*3/uL (ref 150–400)
RBC: 4.23 MIL/uL (ref 3.87–5.11)
RDW: 12.9 % (ref 11.5–15.5)
WBC: 5 10*3/uL (ref 4.0–10.5)
nRBC: 0 % (ref 0.0–0.2)

## 2022-03-09 LAB — VITAMIN B12: Vitamin B-12: 206 pg/mL (ref 180–914)

## 2022-03-09 LAB — OSMOLALITY: Osmolality: 313 mOsm/kg — ABNORMAL HIGH (ref 275–295)

## 2022-03-09 LAB — CBG MONITORING, ED
Glucose-Capillary: >600 mg/dL (ref 70–99)
Glucose-Capillary: 385 mg/dL — ABNORMAL HIGH (ref 70–99)

## 2022-03-09 LAB — RESP PANEL BY RT-PCR (FLU A&B, COVID) ARPGX2
Influenza A by PCR: NEGATIVE
Influenza B by PCR: NEGATIVE
SARS Coronavirus 2 by RT PCR: NEGATIVE

## 2022-03-09 LAB — PREGNANCY, URINE: Preg Test, Ur: NEGATIVE

## 2022-03-09 LAB — BETA-HYDROXYBUTYRIC ACID: Beta-Hydroxybutyric Acid: 0.13 mmol/L (ref 0.05–0.27)

## 2022-03-09 MED ORDER — INSULIN REGULAR HUMAN 100 UNIT/ML IJ SOLN: 6.0000 [IU] | Freq: Once | INTRAMUSCULAR | Status: DC

## 2022-03-09 MED ORDER — INSULIN REGULAR(HUMAN) IN NACL 100-0.9 UT/100ML-% IV SOLN: INTRAVENOUS | Status: DC

## 2022-03-09 MED ORDER — DEXTROSE IN LACTATED RINGERS 5 % IV SOLN: INTRAVENOUS | Status: DC

## 2022-03-09 MED ORDER — LACTATED RINGERS IV SOLN: INTRAVENOUS | Status: DC

## 2022-03-09 MED ORDER — LACTATED RINGERS IV BOLUS
20.0000 mL/kg | Freq: Once | INTRAVENOUS | Status: AC
  Administered 2022-03-09: 1038 mL via INTRAVENOUS

## 2022-03-09 MED ORDER — DEXTROSE 50 % IV SOLN: 0.0000 mL | INTRAVENOUS | Status: DC | PRN

## 2022-03-09 MED ORDER — INSULIN ASPART 100 UNIT/ML IJ SOLN
6.0000 [IU] | Freq: Once | INTRAMUSCULAR | Status: AC
  Administered 2022-03-09: 6 [IU] via SUBCUTANEOUS

## 2022-03-09 MED ORDER — CYANOCOBALAMIN 1000 MCG/ML IJ SOLN
1000.0000 ug | Freq: Once | INTRAMUSCULAR | Status: AC
  Administered 2022-03-09: 1000 ug via INTRAMUSCULAR
  Filled 2022-03-09: qty 1

## 2022-03-09 MED ORDER — POTASSIUM CHLORIDE 10 MEQ/100ML IV SOLN: 10.0000 meq | INTRAVENOUS | Status: AC

## 2022-03-09 NOTE — Progress Notes (Signed)
Lab brought to my attention a panic blood sugar of 836. This lab has been repeated. Per Dr. Myna Hidalgo, I called patient and informed her of this result and instructed her to go to the ER to be assessed. She stated,"this morning my blood sugar was 40." She verbalized understanding and is on her way now to the ER at [facility]. I called and spoke to the ER nurse and informed her of the situation.  ?

## 2022-03-09 NOTE — ED Provider Notes (Signed)
?[facility] EMERGENCY DEPARTMENT ?Provider Note ? ? ?CSN: [id number] ?Arrival date & time: 03/09/22  1617 ? ?  ? ?History ? ?Chief Complaint  ?Patient presents with  ? Hyperglycemia  ? ? ?Sherri [name] is a 56 y.o. female. ? ?HPI ? ?56 year old female with past medical history of DM, chronic back pain, CKD, anxiety/depression presents emergency department the request of her primary doctor for concern of elevated blood sugar.  Patient states she had the appointment scheduled for concern of fatigue.  About a year ago she was transition to insulin and presumed to have type 1 diabetes.  She states "recently" she has not been compliant at night.  She admits to snacking through the night and this has caused uncontrolled blood sugars.  She feels fatigued, she has had decreased appetite but denies any vomiting/diarrhea.  No abdominal pain or fever. ? ?Home Medications ?Prior to Admission medications   ?Medication Sig Start Date End Date Taking? Authorizing Provider  ?albuterol (VENTOLIN HFA) 108 (90 Base) MCG/ACT inhaler TAKE 2 PUFFS BY MOUTH EVERY 6 HOURS AS NEEDED FOR WHEEZE OR SHORTNESS OF BREATH ?Patient taking differently: Inhale 2 puffs into the lungs every 6 (six) hours as needed for wheezing or shortness of breath. 03/25/21   Arnette Felts, FNP  ?Ascorbic Acid (VITAMIN C) 1000 MG tablet TAKE 1 TABLET BY MOUTH EVERY DAY 09/28/21   Arnette Felts, FNP  ?baclofen (LIORESAL) 20 MG tablet Take 1 tablet 3 times daily 06/21/21   Glean Salvo, NP  ?Cholecalciferol (VITAMIN D3) 10 MCG (400 UNIT) tablet Take 400 Units by mouth daily.    [provider]  ?Continuous Blood Gluc Sensor (FREESTYLE LIBRE 2 SENSOR) MISC 1 Device by Does not apply route every 14 (fourteen) days. ?Patient not taking: Reported on 03/09/2022 03/07/22   [name], [name], MD  ?Continuous Blood Gluc Transmit (DEXCOM G6 TRANSMITTER) MISC 1 Device by Does not apply route as directed. ?Patient not taking: Reported on 03/07/2022  08/08/21   [name], [name], MD  ?Cranberry-Vitamin C-Vitamin E 4200-20-3 MG-MG-UNIT CAPS Take 1 tablet by mouth daily.    [provider]  ?desloratadine (CLARINEX) 5 MG tablet TAKE 1 TABLET BY MOUTH  DAILY 01/02/22   Arnette Felts, FNP  ?diazepam (VALIUM) 10 MG tablet Take 2 tablets (20 mg total) by mouth in the morning, at noon, and at bedtime. Home med. 01/05/22   Glean Salvo, NP  ?EPINEPHRINE 0.3 mg/0.3 mL IJ SOAJ injection INJECT INTRAMUSCULARLY 1  PEN AS NEEDED FOR ALLERGIC  RESPONSE AS DIRECTED BY MD. Assunta Found MEDICAL HELP AFTER  USE. ?Patient not taking: Reported on 03/09/2022 01/02/22   Arnette Felts, FNP  ?famotidine (PEPCID) 20 MG tablet TAKE 1 TABLET BY MOUTH TWICE A DAY 07/06/21   Arnette Felts, FNP  ?FLUoxetine (PROZAC) 10 MG capsule Take 1 capsule (10 mg total) by mouth daily. Please call and make overdue follow up appt. 1st attempt 01/04/22   Glean Salvo, NP  ?fluticasone [facility]) 50 MCG/ACT nasal spray Place 2 sprays into both nostrils daily. 11/29/21   Bing Neighbors, FNP  ?gabapentin (NEURONTIN) 300 MG capsule TAKE 1 CAPSULE BY MOUTH  TWICE DAILY 11/08/21   Glean Salvo, NP  ?Glucagon (GVOKE HYPOPEN 2-PACK) 0.5 MG/0.1ML SOAJ Inject 0.1 mLs into the skin as needed (if blood sugar less than 70). 05/19/21   Arnette Felts, FNP  ?glucose blood test strip Use as instructed to check sugars qac and qhs 05/27/21   Charlesetta Ivory, NP  ?hydrOXYzine (VISTARIL)  100 MG capsule TAKE 1 CAPSULE BY MOUTH 3  TIMES DAILY AS NEEDED FOR  ITCHING ?Patient taking differently: Take 100 mg by mouth 3 (three) times daily as needed for itching. 03/01/21   Arnette FeltsMoore, Janece, FNP  ?insulin aspart (NOVOLOG FLEXPEN) 100 UNIT/ML FlexPen Max daily 30 units 11/09/21   [name], Konrad DoloresIbtehal Jaralla, MD  ?insulin degludec (TRESIBA) 100 UNIT/ML FlexTouch Pen Inject 10 Units into the skin daily. 03/07/22   [name], Konrad DoloresIbtehal Jaralla, MD  ?Insulin Pen Needle 32G X 4 MM MISC 1 each by Does not apply route 2 (two) times  daily. Dx E11.9 05/27/21   Arnette FeltsMoore, Janece, FNP  ?Multiple Vitamin (MULTIVITAMIN WITH MINERALS) TABS tablet Take 1 tablet by mouth daily. 09/11/21   Darlin PriestlyLai, Tina, MD  ?vitamin E 180 MG (400 UNITS) capsule Take 400 Units by mouth daily.     [provider]  ?   ? ?Allergies    ?Ibuprofen, Lemon flavor, Amoxicillin, and Tylenol [acetaminophen]   ? ?Review of Systems   ?Review of Systems  ?Constitutional:  Positive for appetite change and fatigue. Negative for fever.  ?Respiratory:  Negative for shortness of breath.   ?Cardiovascular:  Negative for chest pain.  ?Gastrointestinal:  Negative for abdominal pain, diarrhea and vomiting.  ?Genitourinary:  Negative for frequency.  ?Skin:  Negative for rash.  ?Neurological:  Negative for headaches.  ?Psychiatric/Behavioral:  Negative for confusion.   ? ?Physical Exam ?Updated Vital Signs ?BP 133/85   Pulse 73   Temp 98.3 ?F (36.8 ?C) (Oral)   Resp 18   Ht 5\' 3"  (1.6 m)   Wt 51.9 kg   LMP 07/29/2012 (Approximate)   SpO2 100%   BMI 20.27 kg/m?  ?Physical Exam ?Vitals and nursing note reviewed.  ?Constitutional:   ?   General: She is not in acute distress. ?   Appearance: Normal appearance. She is not toxic-appearing.  ?HENT:  ?   Head: Normocephalic.  ?   Mouth/Throat:  ?   Mouth: Mucous membranes are moist.  ?Eyes:  ?   Conjunctiva/sclera: Conjunctivae normal.  ?   Pupils: Pupils are equal, round, and reactive to light.  ?Cardiovascular:  ?   Rate and Rhythm: Normal rate.  ?Pulmonary:  ?   Effort: Pulmonary effort is normal. No respiratory distress.  ?Abdominal:  ?   Palpations: Abdomen is soft.  ?   Tenderness: There is no abdominal tenderness.  ?Skin: ?   General: Skin is warm.  ?Neurological:  ?   Mental Status: She is alert and oriented to person, place, and time. Mental status is at baseline.  ?Psychiatric:     ?   Mood and Affect: Mood normal.  ? ? ?ED Results / Procedures / Treatments   ?Labs ?(all labs ordered are listed, but only abnormal results are  displayed) ?Labs Reviewed  ?COMPREHENSIVE METABOLIC PANEL - Abnormal; Notable for the following components:  ?    Result Value  ? Sodium 128 (*)   ? Chloride 95 (*)   ? Glucose, Bld 722 (*)   ? BUN 25 (*)   ? Creatinine, Ser 1.22 (*)   ? Alkaline Phosphatase 129 (*)   ? GFR, Estimated 52 (*)   ? All other components within normal limits  ?URINALYSIS, ROUTINE W REFLEX MICROSCOPIC - Abnormal; Notable for the following components:  ? Glucose, UA >=500 (*)   ? All other components within normal limits  ?URINALYSIS, MICROSCOPIC (REFLEX) - Abnormal; Notable for the following components:  ? Bacteria, UA RARE (*)   ?  All other components within normal limits  ?CBG MONITORING, ED - Abnormal; Notable for the following components:  ? Glucose-Capillary >600 (*)   ? All other components within normal limits  ?I-STAT VENOUS BLOOD GAS, ED - Abnormal; Notable for the following components:  ? pCO2, Ven 41.6 (*)   ? pO2, Ven 49 (*)   ? Sodium 129 (*)   ? All other components within normal limits  ?RESP PANEL BY RT-PCR (FLU A&B, COVID) [id number]  ?CBC WITH DIFFERENTIAL/PLATELET  ?BETA-HYDROXYBUTYRIC ACID  ?PREGNANCY, URINE  ?OSMOLALITY  ?CBG MONITORING, ED  ? ? ?EKG ?None ? ?Radiology ?No results found. ? ?Procedures ?Procedures  ? ? ?Medications Ordered in ED ?Medications  ?lactated ringers infusion ( Intravenous New Bag/Given 03/09/22 1922)  ?dextrose 5 % in lactated ringers infusion (has no administration in time range)  ?dextrose 50 % solution 0-50 mL (has no administration in time range)  ?potassium chloride 10 mEq in 100 mL IVPB (has no administration in time range)  ?insulin aspart (novoLOG) injection 6 Units (has no administration in time range)  ?lactated ringers bolus 1,038 mL (1,038 mLs Intravenous New Bag/Given 03/09/22 1919)  ? ? ?ED Course/ Medical Decision Making/ A&P ?  ?                        ?Medical Decision Making ?Amount and/or Complexity of Data Reviewed ?Labs: ordered. ? ?Risk ?Prescription drug  management. ? ? ?This patient presents to the ED for concern of elevated blood sugar, this involves an extensive number of treatment options, and is a complaint that carries with it a high risk of complications and morbidity.  The differential dia

## 2022-03-09 NOTE — Progress Notes (Addendum)
?Hematology and Oncology Follow Up Visit ? ?Sherri [name] ?[id number] ?March 21, 1966 56 y.o. ?03/09/2022 ? ? 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  ?Current Therapy:        ?Vitamin B12, 1 mg IM every 6 months ?IV iron as indicated ?  ?Interim History:  Sherri [name] is here today for follow-up and B 12 injection. She is doing well but notes fatigue and rests when needed.  ?No fever, chills, n/v, cough, rash, dizziness, SOB, chest pain, palpitations, abdominal pain or changes in bowel or bladder habits.  ?No blood loss noted. No bruising or petechiae.  ?No swelling in her extremities.  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  ?She has the occasional fall due to balance issues and ambulates with a cane for added support.  ?No syncope.  ?She has maintained a good appetite and is staying well hydrated. Her weight is stable at 114 lbs.  ? ?ECOG Performance Status: 1 - Symptomatic but completely ambulatory ? ?Medications:  ?Allergies as of 03/09/2022   ? ?   Reactions  ? Ibuprofen Other (See Comments)  ? Does not take due to hx of renal insufficiency ?"I have kidney disease"   ? Lemon Flavor Swelling  ? Severe Lip Swelling  ?FRUIT per pt.    ? Amoxicillin Diarrhea, Other (See Comments)  ? Tylenol [acetaminophen] Hives  ? Cannot take large quantities  ? ?  ? ?  ?Medication List  ?  ? ?  ? Accurate as of March 09, 2022  2:47 PM. If you have any questions, ask your nurse or doctor.  ?  ?  ? ?  ? ?acetaminophen 500 MG tablet ?Commonly known as: TYLENOL ?Take 1 tablet (500 mg total) by mouth every 6 (six) hours as needed. ?  ?albuterol 108 (90 Base) MCG/ACT inhaler ?Commonly known as: VENTOLIN HFA ?TAKE 2 PUFFS BY MOUTH EVERY 6 HOURS AS NEEDED FOR WHEEZE OR SHORTNESS OF BREATH ?What changed: See the new instructions. ?  ?baclofen 20 MG tablet ?Commonly known as: LIORESAL ?Take 1 tablet 3 times daily ?   ?Cranberry-Vitamin C-Vitamin E 4200-20-3 MG-MG-UNIT Caps ?Take 1 tablet by mouth daily. ?  ?cyanocobalamin 1000 MCG/ML injection ?Commonly known as: (VITAMIN B-12) ?Inject 1,000 mcg into the muscle every 3 (three) months. ?  ?desloratadine 5 MG tablet ?Commonly known as: CLARINEX ?TAKE 1 TABLET BY MOUTH  DAILY ?  ?Dexcom G6 Transmitter Misc ?1 Device by Does not apply route as directed. ?  ?diazepam 10 MG tablet ?Commonly known as: VALIUM ?Take 2 tablets (20 mg total) by mouth in the morning, at noon, and at bedtime. Home med. ?  ?diclofenac Sodium 1 % Gel ?Commonly known as: VOLTAREN ?Apply 2 g topically 4 (four) times daily. ?  ?doxycycline 100 MG capsule ?Commonly known as: VIBRAMYCIN ?Take 1 capsule (100 mg total) by mouth 2 (two) times daily. ?  ?EPINEPHrine 0.3 mg/0.3 mL Soaj injection ?Commonly known as: EPI-PEN ?INJECT INTRAMUSCULARLY 1  PEN AS NEEDED FOR ALLERGIC  RESPONSE AS DIRECTED BY MD. Kirke Corin MEDICAL HELP AFTER  USE. ?  ?famotidine 20 MG tablet ?Commonly known as: PEPCID ?TAKE 1 TABLET BY MOUTH TWICE A DAY ?  ?FLUoxetine 10 MG capsule ?Commonly known as: PROZAC ?Take 1 capsule (10 mg total) by mouth daily. Please call and make overdue follow up appt. 1st attempt ?  ?fluticasone 50 MCG/ACT nasal spray ?Commonly known as: FLONASE ?  Place 2 sprays into both nostrils daily. ?  ?FreeStyle Libre 2 Sensor Misc ?1 Device by Does not apply route every 14 (fourteen) days. ?  ?gabapentin 300 MG capsule ?Commonly known as: NEURONTIN ?TAKE 1 CAPSULE BY MOUTH  TWICE DAILY ?  ?glucose blood test strip ?Use as instructed to check sugars qac and qhs ?  ?Gvoke HypoPen 2-Pack 0.5 MG/0.1ML Soaj ?Generic drug: Glucagon ?Inject 0.1 mLs into the skin as needed (if blood sugar less than 70). ?  ?hydrOXYzine 100 MG capsule ?Commonly known as: VISTARIL ?TAKE 1 CAPSULE BY MOUTH 3  TIMES DAILY AS NEEDED FOR  ITCHING ?What changed:  ?how much to take ?how to take this ?when to take this ?reasons to take this ?additional  instructions ?  ?insulin degludec 100 UNIT/ML FlexTouch Pen ?Commonly known as: TRESIBA ?Inject 10 Units into the skin daily. ?  ?Insulin Pen Needle 32G X 4 MM Misc ?1 each by Does not apply route 2 (two) times daily. Dx E11.9 ?  ?multivitamin with minerals Tabs tablet ?Take 1 tablet by mouth daily. ?  ?NovoLOG FlexPen 100 UNIT/ML FlexPen ?Generic drug: insulin aspart ?Max daily 30 units ?  ?vitamin C 1000 MG tablet ?TAKE 1 TABLET BY MOUTH EVERY DAY ?  ?Vitamin D3 10 MCG (400 UNIT) tablet ?Take 400 Units by mouth daily. ?  ?vitamin E 180 MG (400 UNITS) capsule ?Take 400 Units by mouth daily. ?  ? ?  ? ? ?Allergies:  ?Allergies  ?Allergen Reactions  ? Ibuprofen Other (See Comments)  ?  Does not take due to hx of renal insufficiency ?"I have kidney disease"   ? Lemon Flavor Swelling  ?  Severe Lip Swelling  ?FRUIT per pt.    ? Amoxicillin Diarrhea and Other (See Comments)  ? Tylenol [Acetaminophen] Hives  ?  Cannot take large quantities  ? ? ?Past Medical History, Surgical history, Social history, and Family History were reviewed and updated. ? ?Review of Systems: ?All other 10 point review of systems is negative.  ? ?Physical Exam: ? vitals were not taken for this visit.  ? ?Wt Readings from Last 3 Encounters:  ?03/07/22 112 lb (50.8 kg)  ?11/10/21 108 lb (49 kg)  ?11/09/21 107 lb (48.5 kg)  ? ? ?Ocular: Sclerae unicteric, pupils equal, round and reactive to light ?Ear-nose-throat: Oropharynx clear, dentition fair ?Lymphatic: No cervical or supraclavicular adenopathy ?Lungs no rales or rhonchi, good excursion bilaterally ?Heart regular rate and rhythm, no murmur appreciated ?Abd soft, nontender, positive bowel sounds ?MSK no focal spinal tenderness, no joint edema ?Neuro: non-focal, well-oriented, appropriate affect ?Breasts: Deferred  ? ?Lab Results  ?Component Value Date  ? WBC 4.9 03/09/2022  ? HGB 11.7 (L) 03/09/2022  ? HCT 36.9 03/09/2022  ? MCV 88.9 03/09/2022  ? PLT 212 03/09/2022  ? ?Lab Results  ?Component  Value Date  ? FERRITIN 623 (H) 09/08/2021  ? IRON 40 09/08/2021  ? TIBC 253 09/08/2021  ? UIBC 213 09/08/2021  ? IRONPCTSAT 16 09/08/2021  ? ?Lab Results  ?Component Value Date  ? RETICCTPCT 1.6 09/09/2021  ? RBC 4.15 03/09/2022  ? RETICCTABS 71.8 12/14/2015  ? ?Lab Results  ?Component Value Date  ? KPAFRELGTCHN 1.06 08/02/2011  ? LAMBDASER 1.49 08/02/2011  ? KAPLAMBRATIO 0.71 08/02/2011  ? ?Lab Results  ?Component Value Date  ? IGGSERUM 1180 08/02/2011  ? IGA 206 08/02/2011  ? IGMSERUM 198 08/02/2011  ? ?Lab Results  ?Component Value Date  ? TOTALPROTELP 6.6 08/02/2011  ? ALBUMINELP 57.1  08/02/2011  ? A1GS 4.1 08/02/2011  ? A2GS 9.6 08/02/2011  ? BETS 6.1 08/02/2011  ? BETA2SER 5.9 08/02/2011  ? GAMS 17.2 08/02/2011  ? MSPIKE NOT DET 08/02/2011  ? SPEI * 08/02/2011  ? ?  Chemistry   ?   ?Component Value Date/Time  ? NA 137 09/11/2021 0558  ? NA 139 01/25/2021 1701  ? NA 136 11/23/2017 1147  ? NA 144 03/22/2017 0943  ? K 4.0 09/11/2021 0558  ? K 3.9 11/23/2017 1147  ? K 4.2 03/22/2017 0943  ? CL 105 09/11/2021 0558  ? CL 94 (L) 11/23/2017 1147  ? CO2 26 09/11/2021 0558  ? CO2 30 11/23/2017 1147  ? CO2 29 03/22/2017 0943  ? BUN 20 09/11/2021 0558  ? BUN 14 01/25/2021 1701  ? BUN 10 11/23/2017 1147  ? BUN 11.1 03/22/2017 0943  ? CREATININE 0.89 09/11/2021 0558  ? CREATININE 1.19 (H) 08/09/2021 1432  ? CREATININE 1.0 11/23/2017 1147  ? CREATININE 1.0 03/22/2017 0943  ? GLU 88 09/05/2018 1535  ?    ?Component Value Date/Time  ? CALCIUM 8.9 09/11/2021 0558  ? CALCIUM 10.1 11/23/2017 1147  ? CALCIUM 9.9 03/22/2017 0943  ? ALKPHOS 97 09/09/2021 0142  ? ALKPHOS 174 (H) 11/23/2017 1147  ? ALKPHOS 114 03/22/2017 0943  ? AST 49 (H) 09/09/2021 0142  ? AST 15 08/09/2021 1432  ? AST 18 03/22/2017 0943  ? ALT 89 (H) 09/09/2021 0142  ? ALT 33 08/09/2021 1432  ? ALT 46 11/23/2017 1147  ? ALT 23 03/22/2017 0943  ? BILITOT 0.3 09/09/2021 0142  ? BILITOT 0.3 08/09/2021 1432  ? BILITOT 0.24 03/22/2017 0943  ?  ? ? ? ?Impression and  Plan: Ms. Doose is a pleasant 56 yo [place] American female with both B 12 and intermittent iron deficiency. ?B 12 given today.  ?Iron studies are pending.  ?Lab and B 12 injection in 6 months.  ?Follow-up in 1 yea

## 2022-03-09 NOTE — Discharge Instructions (Signed)
You have been seen and discharged from the emergency department.  Your blood sugar was found to be elevated which was causing dehydration and low sodium.  You are given IV fluids and insulin here in the department.  While this has improved her symptoms admission was offered.  It is very important that you are compliant with your insulin.  Follow-up with your primary provider for further evaluation and further care. Take home medications as prescribed. If you have any worsening symptoms or further concerns for your health please return to an emergency department for further evaluation. ?

## 2022-03-09 NOTE — Patient Instructions (Signed)
Vitamin B12 and Folate Test °Why am I having this test? °Vitamin B12 and folate (folic acid) are B vitamins needed to make red blood cells and keep your nervous system healthy. Vitamin B12 is in foods such as meats, eggs, dairy products, and fish. Folate is in fruits, beans, and leafy green vegetables. Some foods, such as whole grains, bread, and cereals have vitamin B12 added to them (are fortified). °You may not have enough of these B vitamins (have a deficiency) if your diet lacks these vitamins. Low levels can also be caused by diseases or having had surgeries on your stomach or small intestine that interfere with your ability to absorb the vitamins from your food. °You may have a vitamin B12 and folate test if: °You have symptoms of vitamin B12 or folate deficiency, such as tiredness (fatigue), headache, confusion, poor balance, or tingling and numbness in your hands and feet. °You are pregnant or breastfeeding. Women who are pregnant or breastfeeding need more folate and may need to take dietary supplements. °Your red blood cell count is low (anemia). °You are an older person and have mental confusion. °You have a disease or condition that may lead to a deficiency of these B vitamins. °What is being tested? °This test measures the amount of vitamin B12 and folate in your blood. The tests for vitamin B12 and folate may be done together or separately. °What kind of sample is taken? °A blood sample is required for this test. It is usually collected by inserting a needle into a blood vessel. °How do I prepare for this test? °Follow instructions from your health care provider about eating and drinking before the test. °Tell a health care provider about: °All medicines you are taking, including vitamins, herbs, eye drops, creams, and over-the-counter medicines. °Any medical conditions you have. °Whether you are pregnant or may be pregnant. °How often you drink alcohol. °How are the results reported? °Your test  results will be reported as values that identify the amount of vitamin B12 and folate in your blood. Your health care provider will compare your results to normal ranges that were established after testing a large group of people (reference ranges). Reference ranges may vary among labs and hospitals. For this test, common reference ranges are: °Vitamin B12: 160-950 pg/mL or 118-701 pmol/L (SI units). °Folate: 5-25 ng/mL or 11-57 nmol/L (SI units). °What do the results mean? °Results within the reference range are considered normal. Vitamin B12 or folate levels that are lower than the reference range may be caused by: °Poor nutrition or eating a vegetarian or vegan diet that does not include any foods that come from animals. °Having alcoholism. °Having certain diseases that make it hard to absorb vitamin B12. These diseases include Crohn's disease, chronic pancreatitis, and cystic fibrosis. °Taking certain medicines. °Having had surgeries on your stomach or small intestine. °High levels of vitamin B12 are rare, but they may happen if you have: °Cancer. °Liver disease. °High levels of folate may happen if: °You have anemia. °You are vegetarian. °You have had a recent blood transfusion. °Talk with your health care provider about what your results mean. °Questions to ask your health care provider °Ask your health care provider, or the department that is doing the test: °When will my results be ready? °How will I get my results? °What are my treatment options? °What other tests do I need? °What are my next steps? °Summary °Vitamin B12 and folate (folic acid) are both B vitamins that are needed to make   red blood cells and to keep your nervous system healthy. °You may not have enough B vitamins in your body if you do not get enough in your diet or if you have a disease that makes it hard to absorb vitamin B12. °This test measures the amount of vitamin B12 and folate in your blood. A blood sample is required for the  test. °Talk with your health care provider about what your results mean. °This information is not intended to replace advice given to you by your health care provider. Make sure you discuss any questions you have with your health care provider. °Document Revised: 08/05/2021 Document Reviewed: 08/05/2021 °Elsevier Patient Education © 2022 [employer] ° °

## 2022-03-09 NOTE — ED Triage Notes (Signed)
She was seen by her MD today for fatigue. Her BS was over 800. She was told to come to the ER. CBG at triage. ?

## 2022-03-10 ENCOUNTER — Telehealth: Payer: Self-pay

## 2022-03-10 LAB — IRON AND IRON BINDING CAPACITY (CC-WL,HP ONLY)
Iron: 64 ug/dL (ref 28–170)
Saturation Ratios: 22 % (ref 10.4–31.8)
TIBC: 287 ug/dL (ref 250–450)
UIBC: 223 ug/dL (ref 148–442)

## 2022-03-10 LAB — FERRITIN: Ferritin: 716 ng/mL — ABNORMAL HIGH (ref 11–307)

## 2022-03-10 NOTE — Telephone Encounter (Signed)
Vm left for patient to call back.

## 2022-03-10 NOTE — Telephone Encounter (Signed)
Attempted to contact patient again no answer 

## 2022-03-10 NOTE — Telephone Encounter (Signed)
Patient wanted to let you know that she was in ED yesterday because her sugar was 800. Patient would like you to review her visit and let her know if any changes need to be made.  ?

## 2022-03-13 ENCOUNTER — Telehealth: Payer: Self-pay

## 2022-03-13 NOTE — Telephone Encounter (Signed)
Transition Care Management Follow-up Telephone Call ?Date of discharge and from where: 03/09/2022 [facility]  ?How have you been since you were released from the hospital? Pt states she has been doing fine, much better. ?Any questions or concerns? No ? ?Items Reviewed: ?Did the pt receive and understand the discharge instructions provided? Yes  ?Medications obtained and verified? Yes  ?Other? No  ?Any new allergies since your discharge? No  ?Dietary orders reviewed? Yes ?Do you have support at home? No  ? ?Home Care and Equipment/Supplies: ?Were home health services ordered? no ?If so, what is the name of the agency? N/a  ?Has the agency set up a time to come to the patient's home? no ?Were any new equipment or medical supplies ordered?  No ?What is the name of the medical supply agency? N/a ?Were you able to get the supplies/equipment? no ?Do you have any questions related to the use of the equipment or supplies? No ? ?Functional Questionnaire: (I = Independent and D = Dependent) ?ADLs: i ? ?Bathing/Dressing- i ? ?Meal Prep- i ? ?Eating- i ? ?Maintaining continence- i ? ?Transferring/Ambulation- i ? ?Managing Meds- i ? ?Follow up appointments reviewed: ? ?PCP Hospital f/u appt confirmed? Yes  Scheduled to see n/a on n/a @ n/a. ?Specialist Hospital f/u appt confirmed? No  Scheduled to see n/a on n/a @ n/a. ?Are transportation arrangements needed? No  ?If their condition worsens, is the pt aware to call PCP or go to the Emergency Dept.? Yes ?Was the patient provided with contact information for the PCP's office or ED? Yes ?Was to pt encouraged to call back with questions or concerns? Yes  ?Pt states she does not feel the need of an apt at the moment. Her sugars have been controlled. & she feels fine. Pt aware any questions, concerns or needing an apt she can give the office a call.  ?

## 2022-03-13 NOTE — Telephone Encounter (Signed)
[id number] advise on recommendations. Patient states that she had Sherri [name] and Sherri [name] the day she went to hospital. Patient states that she has to remember to take her insulin at night when she eats.  ?

## 2022-03-13 NOTE — Telephone Encounter (Signed)
LMTCB

## 2022-03-14 ENCOUNTER — Other Ambulatory Visit: Payer: Self-pay | Admitting: Neurology

## 2022-03-15 NOTE — Telephone Encounter (Signed)
Rx refilled.

## 2022-03-29 ENCOUNTER — Other Ambulatory Visit: Payer: Self-pay | Admitting: Nurse Practitioner

## 2022-03-29 ENCOUNTER — Ambulatory Visit (INDEPENDENT_AMBULATORY_CARE_PROVIDER_SITE_OTHER): Payer: Medicare Other

## 2022-03-29 ENCOUNTER — Other Ambulatory Visit: Payer: Self-pay | Admitting: Neurology

## 2022-03-29 DIAGNOSIS — E1021 Type 1 diabetes mellitus with diabetic nephropathy: Secondary | ICD-10-CM

## 2022-03-29 DIAGNOSIS — G2582 Stiff-man syndrome: Secondary | ICD-10-CM

## 2022-03-29 DIAGNOSIS — J45909 Unspecified asthma, uncomplicated: Secondary | ICD-10-CM
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
## 2022-03-30 ENCOUNTER — Telehealth: Payer: Self-pay | Admitting: *Deleted

## 2022-03-30 MED ORDER — DIAZEPAM 10 MG PO TABS: ORAL_TABLET | ORAL | 0 refills | Status: DC

## 2022-03-30 NOTE — Telephone Encounter (Signed)
Verify Drug Registry For Diazepam 10 Mg Tablet ?Last Filled: 02/14/2022 ?Quantity: 180 tablets for 30 days ?Last appointment: 06/21/2021 ?Next appointment: 06/08/2022 ?

## 2022-03-30 NOTE — Telephone Encounter (Signed)
Patient was sent a my chart message 01/05/22 requesting she schedule an appointment with Dr. Marjory Lies for medication evaluation, she didn't respond. I will refill Valium today but needs to have appointment scheduled with Dr. Marjory Lies. ? ?Meds ordered this encounter  ?Medications  ? DISCONTD: diazepam (VALIUM) 10 MG tablet  ?  Sig: TAKE 2 TABLETS BY MOUTH IN THE  MORNING, AT NOON AND AT BEDTIME  ?  Dispense:  180 tablet  ?  Refill:  0  ? diazepam (VALIUM) 10 MG tablet  ?  Sig: TAKE 2 TABLETS BY MOUTH IN THE  MORNING, AT NOON AND AT BEDTIME  ?  Dispense:  180 tablet  ?  Refill:  0  ? ? ?

## 2022-03-30 NOTE — Addendum Note (Signed)
Addended by: Glean Salvo on: 03/30/2022 11:37 AM ? ? Modules accepted: Orders ? ?

## 2022-03-30 NOTE — Telephone Encounter (Signed)
Sherri Burly, NP : Patient was sent a my chart message 01/05/22 requesting she schedule an appointment with Dr. Marjory Lies for medication evaluation, she didn't respond. I will refill Valium today but needs to have appointment scheduled with Dr. Marjory Lies. ?Called patient, LVM informing her NP refilled Valium. She needs to call and schedule appointment to see Dr Marjory Lies, will need to be seen before Valium is refilled again. Left #. Phone staff may schedule when she calls back.  ?

## 2022-03-30 NOTE — Addendum Note (Signed)
Addended by: Christophe Louis E on: 03/30/2022 11:17 AM ? ? Modules accepted: Orders ? ?

## 2022-04-04 ENCOUNTER — Other Ambulatory Visit: Payer: Self-pay

## 2022-04-04 MED ORDER — CETIRIZINE HCL 10 MG PO TABS: 10.0000 mg | ORAL_TABLET | Freq: Every day | ORAL | 2 refills | Status: DC

## 2022-04-10 ENCOUNTER — Other Ambulatory Visit: Payer: Self-pay | Admitting: Nurse Practitioner

## 2022-04-13 ENCOUNTER — Telehealth: Payer: Self-pay | Admitting: Neurology

## 2022-04-13 ENCOUNTER — Telehealth: Payer: Self-pay | Admitting: *Deleted

## 2022-04-13 NOTE — Telephone Encounter (Signed)
She needs her visit w/ Sarah cancelled and rescheduled w/ Dr. Marjory Lies. She will be establishing care with him as a transfer from Dr. Anne Hahn. Thank you!  ? ?I lvm for patient with this info and asked her to call us back so we can reschedule her. ?

## 2022-04-13 NOTE — Chronic Care Management (AMB) (Signed)
?  Care Management  ? ?Note ? ?04/13/2022 ?Name: Sherri [name] MRN: [id number] DOB: May 01, 1966 ? ?Sherri [name] is a 56 y.o. year old female who is a primary care patient of Minette Brine, [place] and is actively engaged with the care management team. I reached out to Lendell Caprice by phone today to assist with re-scheduling a follow up visit with the RN Case Manager ? ?Follow up plan: ?Unsuccessful telephone outreach attempt made. A HIPAA compliant phone message was left for the patient providing contact information and requesting a return call.  ?The care management team will reach out to the patient again over the next 7 days.  ?If patient returns call to provider office, please advise to call [facility] at [phone]. ? ?Laverda Sorenson  ?Care Guide, Embedded Care Coordination ?[facility]  Care Management  ?Direct Dial: [phone] ? ?

## 2022-04-14 NOTE — Telephone Encounter (Signed)
Rescheduled pt appt with Dr. Marjory Lies on 09/12/22 at 3:30 pm. Pt requested to be put on the cancellation list and call if early appt becomes available ?

## 2022-04-15 ENCOUNTER — Other Ambulatory Visit: Payer: Self-pay | Admitting: Neurology

## 2022-04-17 DIAGNOSIS — Z20822 Contact with and (suspected) exposure to covid-19: Secondary | ICD-10-CM | POA: Diagnosis not present

## 2022-04-17 NOTE — Telephone Encounter (Signed)
Rx refilled.

## 2022-04-17 NOTE — Chronic Care Management (AMB) (Signed)
?  Care Management  ? ?Note ? ?04/17/2022 ?Name: Sahira Cataldi MRN: [id number] DOB: Jan 24, 1966 ? ?Emma Birchler is a 56 y.o. year old female who is a primary care patient of Arnette Felts, FNP and is actively engaged with the care management team. I reached out to [name] by phone today to assist with re-scheduling a follow up visit with the RN Case Manager ? ?Follow up plan: ?Telephone appointment with care management team member scheduled for:05/09/22 ? ?Gwenevere Ghazi  ?Care Guide, Embedded Care Coordination ?[place]  Care Management  ?Direct Dial: [phone] ? ?

## 2022-04-17 NOTE — Chronic Care Management (AMB) (Signed)
?  Care Management  ? ?Note ? ?04/17/2022 ?Name: Donnajean Chesnut MRN: [id number] DOB: 24-Oct-1966 ? ?Maja Mccaffery is a 56 y.o. year old female who is a primary care patient of Arnette Felts, FNP and is actively engaged with the care management team. I reached out to [name] by phone today to assist with re-scheduling a follow up visit with the RN Case Manager ? ?Follow up plan: ?Unsuccessful telephone outreach attempt made. A HIPAA compliant phone message was left for the patient providing contact information and requesting a return call.  ?The care management team will reach out to the patient again over the next 7 days.  ?If patient returns call to provider office, please advise to call Embedded Care Management Care Guide Misty Stanley at [phone]. ? ?Gwenevere Ghazi  ?Care Guide, Embedded Care Coordination ?[place]  Care Management  ?Direct Dial: [phone] ? ?

## 2022-04-21 ENCOUNTER — Other Ambulatory Visit: Payer: Self-pay

## 2022-04-21 MED ORDER — CETIRIZINE HCL 10 MG PO TABS: 10.0000 mg | ORAL_TABLET | Freq: Every day | ORAL | 2 refills | Status: DC

## 2022-04-23 DIAGNOSIS — J45909 Unspecified asthma, uncomplicated: Secondary | ICD-10-CM

## 2022-04-23 DIAGNOSIS — E1021 Type 1 diabetes mellitus with diabetic nephropathy: Secondary | ICD-10-CM

## 2022-04-28 ENCOUNTER — Telehealth: Payer: Medicare Other

## 2022-04-28 DIAGNOSIS — Z20822 Contact with and (suspected) exposure to covid-19: Secondary | ICD-10-CM | POA: Diagnosis not present

## 2022-05-09 ENCOUNTER — Telehealth: Payer: Medicare Other

## 2022-05-09 ENCOUNTER — Ambulatory Visit (INDEPENDENT_AMBULATORY_CARE_PROVIDER_SITE_OTHER): Payer: Medicare Other

## 2022-05-09 DIAGNOSIS — G2582 Stiff-man syndrome: Secondary | ICD-10-CM

## 2022-05-09 DIAGNOSIS — E063 Autoimmune thyroiditis: Secondary | ICD-10-CM

## 2022-05-09 DIAGNOSIS — J45909 Unspecified asthma, uncomplicated: Secondary | ICD-10-CM

## 2022-05-09 DIAGNOSIS — K219 Gastro-esophageal reflux disease without esophagitis: Secondary | ICD-10-CM

## 2022-05-09 DIAGNOSIS — F32A Depression, unspecified: Secondary | ICD-10-CM

## 2022-05-09 DIAGNOSIS — E1021 Type 1 diabetes mellitus with diabetic nephropathy: Secondary | ICD-10-CM

## 2022-05-09 NOTE — Chronic Care Management (AMB) (Signed)
  Care Management   Follow Up Note   05/09/2022 Name: Rhodia Acres MRN: [id number] DOB: 1966-01-11   Referred by: Arnette Felts, FNP Reason for referral : Chronic Care Management (RN CM Follow up call #3)   Third unsuccessful telephone outreach was attempted today. The patient was referred to the case management team for assistance with care management and care coordination. The patient's primary care provider has been notified of our unsuccessful attempts to make or maintain contact with the patient. The care management team is pleased to engage with this patient at any time in the future should he/she be interested in assistance from the care management team.   Follow Up Plan: We have been unable to make contact with the patient for follow up. The care management team is available to follow up with the patient after provider conversation with the patient regarding recommendation for care management engagement and subsequent re-referral to the care management team.   Delsa Sale, RN, BSN, CCM Care Management Coordinator [facility] Care Management/[facility]  Direct Phone: [phone]

## 2022-05-09 NOTE — Telephone Encounter (Signed)
This encounter was created in error - please disregard.

## 2022-05-17 ENCOUNTER — Other Ambulatory Visit: Payer: Self-pay | Admitting: Neurology

## 2022-05-17 NOTE — Telephone Encounter (Signed)
Rx refilled.

## 2022-05-23 ENCOUNTER — Telehealth: Payer: Self-pay

## 2022-05-23 ENCOUNTER — Other Ambulatory Visit: Payer: Self-pay | Admitting: Neurology

## 2022-05-23 ENCOUNTER — Telehealth: Payer: Medicare Other

## 2022-05-23 NOTE — Telephone Encounter (Signed)
Pt request refill for diazepam (VALIUM) 10 MG tablet  at  [facility] [phone]

## 2022-05-23 NOTE — Telephone Encounter (Signed)
Called the patient. Last seen in ov 05/2021. Pt is not scheduled until 08/2022 with Dr Leta Baptist. Advised that we would really need to get her worked in sooner and I advised that we had an opening come available with Dr Leta Baptist for 5/31 at 10:30 am with check in of 10 am. Pt accepted this apt and will discuss her medication with Dr Leta Baptist at the upcoming apt.

## 2022-05-23 NOTE — Telephone Encounter (Signed)
  Care Management   Follow Up Note   05/23/2022 Name: Sherri [name] MRN: [id number] DOB: [birth year]   Referred by: Arnette Felts, FNP Reason for referral : Chronic Care Management (RN CM Follow up call )   An unsuccessful telephone outreach was attempted today. The patient was referred to the case management team for assistance with care management and care coordination.   Follow Up Plan: Telephone follow up appointment with care management team member scheduled for: 05/30/22  Delsa Sale, RN, BSN, CCM Care Management Coordinator [facility] Care Management/[facility]  Direct Phone: [phone]

## 2022-05-24 ENCOUNTER — Ambulatory Visit: Payer: Medicare Other | Admitting: Diagnostic Neuroimaging

## 2022-05-24 ENCOUNTER — Telehealth: Payer: Self-pay | Admitting: Neurology

## 2022-05-24 DIAGNOSIS — F32A Depression, unspecified: Secondary | ICD-10-CM

## 2022-05-24 DIAGNOSIS — J45909 Unspecified asthma, uncomplicated: Secondary | ICD-10-CM

## 2022-05-24 DIAGNOSIS — E1021 Type 1 diabetes mellitus with diabetic nephropathy: Secondary | ICD-10-CM

## 2022-05-24 MED ORDER — DIAZEPAM 10 MG PO TABS: ORAL_TABLET | ORAL | 0 refills | Status: DC

## 2022-05-24 NOTE — Addendum Note (Signed)
Addended by: Christophe Louis E on: 05/24/2022 09:25 AM   Modules accepted: Orders
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
## 2022-05-24 NOTE — Telephone Encounter (Signed)
Pt rescheduled appt 06/01/22 at 3:00 pm. Pt requesting refill  for Diazepam 10 MG at [id number]

## 2022-05-24 NOTE — Telephone Encounter (Signed)
Verify Drug Registry For Diazepam 10 Mg Tablet Last Filled: 04/03/2022 Quantity: 180 tablets for 30 days Last appointment: 06/21/2021 Next appointment: 06/01/2022

## 2022-05-25 ENCOUNTER — Telehealth: Payer: Self-pay | Admitting: Neurology
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
## 2022-05-25 NOTE — Telephone Encounter (Signed)
I attempted to call Sherri [name] back but could not get through.... I then call the pharmacy and spoke with [name] since refills are on file for this med. He confirmed the medication was approved by the insurance and copay is 3.50 $. They were questioning the dosage since signature states diazepam 10 mg  2 tablets by mouth in the morning, noon, and bedtime. This is a high dosage for this med. I advised the pt was previously seen by Dr. Jannifer Franklin who has since retired and will be seeing Dr. Leta Baptist on 06/01/2022- he will discuss meds going forward at that time.

## 2022-05-30 ENCOUNTER — Telehealth: Payer: Medicare Other

## 2022-06-01 ENCOUNTER — Telehealth: Payer: Self-pay

## 2022-06-01 ENCOUNTER — Ambulatory Visit: Payer: Medicare Other | Admitting: Diagnostic Neuroimaging

## 2022-06-01 ENCOUNTER — Encounter: Payer: Self-pay | Admitting: Diagnostic Neuroimaging

## 2022-06-01 NOTE — Chronic Care Management (AMB) (Signed)
Chronic Care Management Pharmacy Assistant   Name: Sherri [name]  MRN: [id number] DOB: 04-06-1966  Reason for Encounter: Disease State/ Diabetes   Recent office visits:  03-29-2022 Sherri [name] (CCM)  01-11-2022 Sherri Churches, RN (CCM)  01-02-2022 [name], [name], RN (CCM)  12-27-2021 Sherri [name] (CCM)  12-20-2022 Sherri Churches, RN (CCM)  12-09-2022 Sherri [name] (CCM)  Recent consult visits:  03-09-2022 Sherri Awe, RN (Oncology). B12 injection given.  03-09-2022 Sherri Blank, NP (Oncology). FINISHED doxycycline. STOPPED due to patient preference Tylenol 500 mg every 6 hours PRN and Voltaren gel.  03-07-2022 [name], [name], MD (Endo). DECREASE tresiba 13 units daily TO 10 units daily.  Hospital visits:  Medication Reconciliation was completed by comparing discharge summary, patient's EMR and Pharmacy list, and upon discussion with patient.  Admitted to the hospital on 03-09-2022 due to Hyperglycemia.  Discharge date was 03-09-2022. Discharged from [facility].    New?Medications Started at [facility] Discharge:?? None  Medication Changes at Hospital Discharge: None  Medications Discontinued at Hospital Discharge: None  Medications that remain the same after Hospital Discharge:??  -All other medications will remain the same.    Medications: Outpatient Encounter Medications as of 06/01/2022  Medication Sig   albuterol (VENTOLIN HFA) 108 (90 Base) MCG/ACT inhaler TAKE 2 PUFFS BY MOUTH EVERY 6 HOURS AS NEEDED FOR WHEEZE OR SHORTNESS OF BREATH (Patient taking differently: Inhale 2 puffs into the lungs every 6 (six) hours as needed for wheezing or shortness of breath.)   Ascorbic Acid (VITAMIN C) 1000 MG tablet TAKE 1 TABLET BY MOUTH EVERY DAY   baclofen (LIORESAL) 20 MG tablet TAKE 1 TABLET BY MOUTH 3  TIMES DAILY   cetirizine (ZYRTEC ALLERGY) 10 MG tablet Take 1 tablet (10 mg total) by mouth daily.   Cholecalciferol  (VITAMIN D3) 10 MCG (400 UNIT) tablet Take 400 Units by mouth daily.   Continuous Blood Gluc Sensor (FREESTYLE LIBRE 2 SENSOR) MISC 1 Device by Does not apply route every 14 (fourteen) days. (Patient not taking: Reported on 03/09/2022)   Continuous Blood Gluc Transmit (DEXCOM G6 TRANSMITTER) MISC 1 Device by Does not apply route as directed. (Patient not taking: Reported on 03/07/2022)   Cranberry-Vitamin C-Vitamin E 4200-20-3 MG-MG-UNIT CAPS Take 1 tablet by mouth daily.   desloratadine (CLARINEX) 5 MG tablet TAKE 1 TABLET BY MOUTH DAILY   diazepam (VALIUM) 10 MG tablet TAKE 2 TABLETS BY MOUTH IN THE  MORNING, AT NOON AND AT BEDTIME   EPINEPHRINE 0.3 mg/0.3 mL IJ SOAJ injection INJECT INTRAMUSCULARLY 1 PEN AS  NEEDED FOR ALLERGIC RESPONSE AS  DIRECTED BY MD. SEEK MEDICAL  HELP AFTER USE.   famotidine (PEPCID) 20 MG tablet TAKE 1 TABLET BY MOUTH  TWICE DAILY   FLUoxetine (PROZAC) 10 MG capsule TAKE 1 CAPSULE BY MOUTH DAILY   fluticasone (FLONASE) 50 MCG/ACT nasal spray Place 2 sprays into both nostrils daily.   gabapentin (NEURONTIN) 300 MG capsule TAKE 1 CAPSULE BY MOUTH TWICE  DAILY   Glucagon (GVOKE HYPOPEN 2-PACK) 0.5 MG/0.1ML SOAJ Inject 0.1 mLs into the skin as needed (if blood sugar less than 70).   glucose blood test strip Use as instructed to check sugars qac and qhs   hydrOXYzine (VISTARIL) 100 MG capsule TAKE 1 CAPSULE BY MOUTH 3  TIMES DAILY AS NEEDED FOR  ITCHING (Patient taking differently: Take 100 mg by mouth 3 (three) times daily as needed for itching.)   insulin aspart (NOVOLOG FLEXPEN) 100 UNIT/ML FlexPen  Max daily 30 units   insulin degludec (TRESIBA) 100 UNIT/ML FlexTouch Pen Inject 10 Units into the skin daily.   Insulin Pen Needle 32G X 4 MM MISC 1 each by Does not apply route 2 (two) times daily. Dx E11.9   Multiple Vitamin (MULTIVITAMIN WITH MINERALS) TABS tablet Take 1 tablet by mouth daily.   vitamin E 180 MG (400 UNITS) capsule Take 400 Units by mouth daily.    No  facility-administered encounter medications on file as of 06/01/2022.  Recent Relevant Labs: Lab Results  Component Value Date/Time   HGBA1C 11.3 (A) 03/07/2022 02:51 PM   HGBA1C 13.7 (A) 11/09/2021 02:04 PM   HGBA1C 12.5 (H) 09/08/2021 03:30 PM   HGBA1C 13.0 (H) 05/06/2021 05:41 AM   MICROALBUR 30mg  03/22/2021 11:39 AM   MICROALBUR 30 06/04/2020 12:29 PM    Kidney Function Lab Results  Component Value Date/Time   CREATININE 1.22 (H) 03/09/2022 04:45 PM   CREATININE 1.32 (H) 03/09/2022 02:35 PM   CREATININE 0.89 09/11/2021 05:58 AM   CREATININE 1.19 (H) 08/09/2021 02:32 PM   CREATININE 1.0 11/23/2017 11:47 AM   CREATININE 1.0 07/23/2017 11:30 AM   CREATININE 1.0 03/22/2017 09:43 AM   CREATININE 1.2 (H) 08/24/2016 10:04 AM   GFR 55.40 (L) 08/08/2021 10:58 AM   GFRNONAA 52 (L) 03/09/2022 04:45 PM   GFRNONAA 47 (L) 03/09/2022 02:35 PM   GFRAA 59 (L) 01/25/2021 05:01 PM   GFRAA >60 07/27/2020 01:16 PM    Current antihyperglycemic regimen:  Glucagon inject 0.1 mls if blood sugar is less than 70 (patient states never picked med up) Novolog max daily 30 units Tresiba 10 units daily  What recent interventions/DTPs have been made to improve glycemic control:  Educated on A1c and blood sugar goals; Complications of diabetes including kidney damage, retinal damage, and cardiovascular disease; Prevention and management of hypoglycemic episodes; -Counseled to check feet daily and get yearly eye exams  Have there been any recent hospitalizations or ED visits since last visit with CPP? Yes  Patient reports hypoglycemic symptoms, including Sweaty and Shaky  Patient denies hyperglycemic symptoms  How often are you checking your blood sugar? twice daily  What are your blood sugars ranging?  Fasting: None Before meals: None After meals: 52, 170 Bedtime: 42, 125 (After drinking juice)  During the week, how often does your blood glucose drop below 70? Frequently  Are you checking  your feet daily/regularly? Daily  Adherence Review: Is the patient currently on a STATIN medication? No Is the patient currently on ACE/ARB medication? No Does the patient have >5 day gap between last estimated fill dates? No   Care Gaps: Covid vaccine overdue Yearly ophthalmology exam overdue Shingrix overdue AWV 08-17-2022 Yearly mammogram overdue Yearly foot exam overdue Covid vaccine overdue  Star Rating Drugs: None  [facility] CMA Clinical Pharmacist Assistant [phone]

## 2022-06-07 ENCOUNTER — Telehealth: Payer: Medicare Other

## 2022-06-07 ENCOUNTER — Telehealth: Payer: Self-pay

## 2022-06-07 NOTE — Telephone Encounter (Signed)
  Care Management   Follow Up Note   06/07/2022 Name: Sherri [name] MRN: [id number] DOB: 1966-09-18   Referred by: Minette Brine, FNP Reason for referral : Chronic Care Management (RN CM follow up call )   A second unsuccessful telephone outreach was attempted today. The patient was referred to the case management team for assistance with care management and care coordination.   Follow Up Plan: Telephone follow up appointment with care management team member scheduled for: 06/21/22  Barb Merino, RN, BSN, CCM Care Management Coordinator [facility]  Direct Phone: [phone]

## 2022-06-08 ENCOUNTER — Ambulatory Visit: Payer: Medicare Other | Admitting: Neurology

## 2022-06-12 ENCOUNTER — Telehealth: Payer: Self-pay

## 2022-06-12 NOTE — Telephone Encounter (Signed)
LMTCB. Please find out exactly what patient is needing from our office.

## 2022-06-12 NOTE — Telephone Encounter (Signed)
Patient needs a letter a stating that it's okay for patient to have dental work.. Patient will have xray tomorrow and will be getting either dentures or implants.

## 2022-06-13 NOTE — Telephone Encounter (Signed)
Patient will contact dental office and have them to fax a form over

## 2022-06-13 NOTE — Telephone Encounter (Signed)
Left vm for patient to call back.

## 2022-06-21 ENCOUNTER — Telehealth: Payer: Medicare Other

## 2022-06-21 ENCOUNTER — Ambulatory Visit (INDEPENDENT_AMBULATORY_CARE_PROVIDER_SITE_OTHER): Payer: Medicare Other

## 2022-06-21 DIAGNOSIS — F32A Depression, unspecified: Secondary | ICD-10-CM

## 2022-06-21 DIAGNOSIS — E1021 Type 1 diabetes mellitus with diabetic nephropathy: Secondary | ICD-10-CM

## 2022-06-21 DIAGNOSIS — G2582 Stiff-man syndrome: Secondary | ICD-10-CM

## 2022-06-21 DIAGNOSIS — E063 Autoimmune thyroiditis: Secondary | ICD-10-CM

## 2022-06-21 DIAGNOSIS — J45909 Unspecified asthma, uncomplicated: Secondary | ICD-10-CM

## 2022-06-21 DIAGNOSIS — K219 Gastro-esophageal reflux disease without esophagitis: Secondary | ICD-10-CM

## 2022-06-22 ENCOUNTER — Telehealth: Payer: Medicare Other

## 2022-06-22 ENCOUNTER — Ambulatory Visit: Payer: Self-pay

## 2022-06-22 ENCOUNTER — Encounter: Payer: Self-pay | Admitting: Nurse Practitioner

## 2022-06-22 DIAGNOSIS — E063 Autoimmune thyroiditis: Secondary | ICD-10-CM

## 2022-06-22 DIAGNOSIS — G2582 Stiff-man syndrome: Secondary | ICD-10-CM

## 2022-06-22 DIAGNOSIS — J45909 Unspecified asthma, uncomplicated: Secondary | ICD-10-CM

## 2022-06-22 DIAGNOSIS — E1021 Type 1 diabetes mellitus with diabetic nephropathy: Secondary | ICD-10-CM

## 2022-06-22 DIAGNOSIS — K219 Gastro-esophageal reflux disease without esophagitis: Secondary | ICD-10-CM

## 2022-06-22 NOTE — Chronic Care Management (AMB) (Signed)
Chronic Care Management   CCM RN Visit Note  06/21/2022 Name: Sherri [name] MRN: [id number] DOB: 07-11-1966  Subjective: Sherri [name] is a 56 y.o. year old female who is a primary care patient of Arnette Felts, FNP. The care management team was consulted for assistance with disease management and care coordination needs.    Engaged with patient by telephone for initial visit in response to provider referral for case management and/or care coordination services.   Consent to Services:  The patient was given information about Chronic Care Management services, agreed to services, and gave verbal consent prior to initiation of services.  Please see initial visit note for detailed documentation.   Patient agreed to services and verbal consent obtained.   Assessment: Review of patient past medical history, allergies, medications, health status, including review of consultants reports, laboratory and other test data, was performed as part of comprehensive evaluation and provision of chronic care management services.   SDOH (Social Determinants of Health) assessments and interventions performed:  Yes, no acute needs  CCM Care Plan  Allergies  Allergen Reactions   Ibuprofen Other (See Comments)    Does not take due to hx of renal insufficiency "I have kidney disease"    Lemon Flavor Swelling    Severe Lip Swelling  FRUIT per pt.     Amoxicillin Diarrhea and Other (See Comments)   Tylenol [Acetaminophen] Hives    Cannot take large quantities    Outpatient Encounter Medications as of 06/21/2022  Medication Sig   albuterol (VENTOLIN HFA) 108 (90 Base) MCG/ACT inhaler TAKE 2 PUFFS BY MOUTH EVERY 6 HOURS AS NEEDED FOR WHEEZE OR SHORTNESS OF BREATH (Patient taking differently: Inhale 2 puffs into the lungs every 6 (six) hours as needed for wheezing or shortness of breath.)   Ascorbic Acid (VITAMIN C) 1000 MG tablet TAKE 1 TABLET BY MOUTH EVERY DAY   baclofen (LIORESAL) 20 MG tablet TAKE 1  TABLET BY MOUTH 3  TIMES DAILY   cetirizine (ZYRTEC ALLERGY) 10 MG tablet Take 1 tablet (10 mg total) by mouth daily.   Cholecalciferol (VITAMIN D3) 10 MCG (400 UNIT) tablet Take 400 Units by mouth daily.   Continuous Blood Gluc Sensor (FREESTYLE LIBRE 2 SENSOR) MISC 1 Device by Does not apply route every 14 (fourteen) days. (Patient not taking: Reported on 03/09/2022)   Continuous Blood Gluc Transmit (DEXCOM G6 TRANSMITTER) MISC 1 Device by Does not apply route as directed. (Patient not taking: Reported on 03/07/2022)   Cranberry-Vitamin C-Vitamin E 4200-20-3 MG-MG-UNIT CAPS Take 1 tablet by mouth daily.   desloratadine (CLARINEX) 5 MG tablet TAKE 1 TABLET BY MOUTH DAILY   diazepam (VALIUM) 10 MG tablet TAKE 2 TABLETS BY MOUTH IN THE  MORNING, AT NOON AND AT BEDTIME   EPINEPHRINE 0.3 mg/0.3 mL IJ SOAJ injection INJECT INTRAMUSCULARLY 1 PEN AS  NEEDED FOR ALLERGIC RESPONSE AS  DIRECTED BY MD. SEEK MEDICAL  HELP AFTER USE.   famotidine (PEPCID) 20 MG tablet TAKE 1 TABLET BY MOUTH  TWICE DAILY   FLUoxetine (PROZAC) 10 MG capsule TAKE 1 CAPSULE BY MOUTH DAILY   fluticasone (FLONASE) 50 MCG/ACT nasal spray Place 2 sprays into both nostrils daily.   gabapentin (NEURONTIN) 300 MG capsule TAKE 1 CAPSULE BY MOUTH TWICE  DAILY   Glucagon (GVOKE HYPOPEN 2-PACK) 0.5 MG/0.1ML SOAJ Inject 0.1 mLs into the skin as needed (if blood sugar less than 70).   glucose blood test strip Use as instructed to check sugars qac and qhs  hydrOXYzine (VISTARIL) 100 MG capsule TAKE 1 CAPSULE BY MOUTH 3  TIMES DAILY AS NEEDED FOR  ITCHING (Patient taking differently: Take 100 mg by mouth 3 (three) times daily as needed for itching.)   insulin aspart (NOVOLOG FLEXPEN) 100 UNIT/ML FlexPen Max daily 30 units   insulin degludec (TRESIBA) 100 UNIT/ML FlexTouch Pen Inject 10 Units into the skin daily.   Insulin Pen Needle 32G X 4 MM MISC 1 each by Does not apply route 2 (two) times daily. Dx E11.9   Multiple Vitamin (MULTIVITAMIN WITH  MINERALS) TABS tablet Take 1 tablet by mouth daily.   vitamin E 180 MG (400 UNITS) capsule Take 400 Units by mouth daily.    No facility-administered encounter medications on file as of 06/21/2022.    Patient Active Problem List   Diagnosis Date Noted   Type 1 diabetes mellitus with hyperglycemia (HCC) 11/11/2021   Malnutrition of moderate degree 09/09/2021   AMS (altered mental status) 09/09/2021   Acute encephalopathy 09/08/2021   Acute lower UTI 09/08/2021   Elevated LFTs 09/08/2021   Elevated lipase 09/08/2021   Hashimoto's disease 08/09/2021   Hypoglycemia 05/05/2021   AKI (acute kidney injury) (HCC) 05/05/2021   Anxiety    Chronic rhinitis 04/24/2019   Keloid 10/10/2018   Depression 01/02/2017   History of DVT (deep vein thrombosis) 01/02/2017   Chronic pain syndrome 01/02/2017   Cocaine abuse (HCC) 01/02/2017   Dysphagia    Adjustment insomnia    Distal radius fracture, right 08/16/2016   Postconcussion syndrome 12/30/2014   Tremor 03/11/2014   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   Hiatal hernia 03/21/2013   Anemia, iron deficiency 01/29/2013   Spasm of muscle 09/02/2012   Anemia 08/28/2012   Mild intermittent asthma 08/27/2012   Chronic back pain    Pernicious anemia 10/30/2011   DEGENERATIVE DISC DISEASE 08/18/2010   Asthma with bronchitis 07/28/2010   Recurrent urticaria 07/28/2010    Conditions to be addressed/monitored: DMI, Stiff-Person Syndrome, Asthma with bronchitis, GERD, Depression, Hashimoto's disease  Care Plan : Transportation needs  Updates made by [name], RN since 06/21/2022 12:00 AM  Completed 06/22/2022   Problem: Transportation Assistance needed Resolved 06/22/2022  Priority: High     Goal: Transportation Assistance needed Completed 06/22/2022  Start Date: 07/13/2021  Expected End Date: 10/13/2021  Recent Progress: On track  Priority: High  Note:   Current Barriers:  Ineffective Self Health Maintenance in a patient with DMII,  Stiff-Person Syndrome, Asthma with bronchitis, GERD, Depression Medical transportation resources needed   Clinical Goal(s):  Collaboration with Arnette Felts, FNP regarding development and update of comprehensive plan of care as evidenced by provider attestation and co-signature Inter-disciplinary care team collaboration (see longitudinal plan of care) patient will work with care management team to address care coordination and chronic disease management needs related to Disease Management Educational Needs Care Coordination Medication Management and Education Psychosocial Support   Interventions:  07/13/21 completed inbound call with patient  Evaluation of current treatment plan related to Transportation self-management and patient's adherence to plan as established by provider. Collaboration with Arnette Felts, FNP regarding development and update of comprehensive plan of care as evidenced by provider attestation       and co-signature Inter-disciplinary care team collaboration (see longitudinal plan of care) Determined patient may need assistance with medical transportation Determined patient's car is expected to be repossessed by the dealership in the near future and or as early as this afternoon Sent [facility] community resource referral marked Emergent in  order for transportation resources to be offered to patient, patient made aware to expect a call from a Care Guide team member in order to discuss resources for transportation  Discussed plans with patient for ongoing care management follow up and provided patient with direct contact information for care management team Self Care Activities:  Self administers medications as prescribed Attends all scheduled provider appointments Calls pharmacy for medication refills Calls provider office for new concerns or questions Patient Goals: - work with Care guide for transportation assistance   Follow Up Plan: Telephone follow up appointment with  care management team member scheduled for: 08/10/21     Care Plan : RN Care Manager Plan of Care  Updates made by [name], RN since 06/22/2022 12:00 AM     Problem: No plan of care established for management of chronic disease states (DMI, Stiff-Person Syndrome, Asthma with bronchitis, GERD, Depression, Hashimoto's disease)   Priority: High     Long-Range Goal: Establishment of plan of care for management of chronic disease states (DMI, Stiff-Person Syndrome, Asthma with bronchitis, GERD, Depression, Hashimoto's disease)   Start Date: 06/22/2022  Expected End Date: 06/22/2023  This Visit's Progress: On track  Priority: High  Note:   Current Barriers:  Knowledge Deficits related to plan of care for management of DMI, Stiff-Person Syndrome, Asthma with bronchitis, GERD, Depression, Hashimoto's disease  Chronic Disease Management support and education needs related to DMI, Stiff-Person Syndrome, Asthma with bronchitis, GERD, Depression, Hashimoto's disease   RNCM Clinical Goal(s):  Patient will verbalize basic understanding of  DMI, Stiff-Person Syndrome, Asthma with bronchitis, GERD, Depression, Hashimoto's disease disease process and self health management plan as evidenced by patient will report having 100% adherence to her prescribed treatment plan related to her chronic disease states as listed above  demonstrate Improved adherence to prescribed treatment plan for DMI, Stiff-Person Syndrome, Asthma with bronchitis, GERD, Depression, Hashimoto's disease as evidenced by patient will demonstrate improved understanding of prescribed medications and rationale for usage as evidenced by patient teach back  continue to work with RN Care Manager to address care management and care coordination needs related to  DMI, Stiff-Person Syndrome, Asthma with bronchitis, GERD, Depression, Hashimoto's disease as evidenced by adherence to CM Team Scheduled appointments demonstrate ongoing self health  care management ability   as evidenced by    through collaboration with RN Care manager, provider, and care team.   Interventions: 1:1 collaboration with primary care provider regarding development and update of comprehensive plan of care as evidenced by provider attestation and co-signature Inter-disciplinary care team collaboration (see longitudinal plan of care) Evaluation of current treatment plan related to  self management and patient's adherence to plan as established by provider  Diabetes Interventions:  (Status:  New goal.) Long Term Goal Assessed patient's understanding of A1c goal:  <7.5 % Provided education to patient about basic DM disease process Reviewed medications with patient and discussed importance of medication adherence Counseled on importance of regular laboratory monitoring as prescribed Advised patient, providing education and rationale, to check cbg as directed by Dr. Lonzo Cloud and record, calling Dr. Lonzo Cloud for findings outside established parameters Review of patient status, including review of consultants reports, relevant laboratory and other test results, and medications completed Assessed for SDOH barriers, determined patient needs assistance with transportation, sent referral to [facility] Care Guide requesting further assistance, marked high priority due to patient has several upcoming appointments   Reviewed scheduled/upcoming provider appointment including: next Endocrinology follow up appointment scheduled with Dr. Lonzo Cloud on  07/07/22 @2 :00 PM  Lab Results  Component Value Date   HGBA1C 11.3 (A) 03/07/2022   Dental care interventions:  (Status:  New goal.)  Long Term Goal Evaluation of current treatment plan related to  dental care , self-management and patient's adherence to plan as established by provider Determined patient reports following up with Dr. 03/09/2022 at [facility] for treatment of several dental needs Discussed patient will need several  tooth extractions that will require antibiotic prophylaxis and potentially may require a hospital admission  Discussed Dr[facility] faxed a form to be completed for clearance, however the form was sent to Dr. Katrinka Blazing, Endocrinology Advised patient she may need to see her PCP Lonzo Cloud FNP before her scheduled appointment in August due to being behind with her visits Instructed patient to contact Dr. September to provide the fax number for [facility] and refax to her PCP provider Collaborated with PCP regarding patient's upcoming dental procedure and clearance needed prior to the appointment Discussed PCP scheduled patient for an earlier appointment for Monday, 06/26/22 @11 :20 AM Placed outbound call to patient to advise, left a voice message for patient to call this RN CM to confirm she will keep the appointment as scheduled with PCP for 06/26/22 @11 :20 AM Discussed plans with patient for ongoing care management follow up and provided patient with direct contact information for care management team  Patient Goals/Self-Care Activities: Take all medications as prescribed Attend all scheduled provider appointments Call pharmacy for medication refills 3-7 days in advance of running out of medications Perform all self care activities independently  Perform IADL's (shopping, preparing meals, housekeeping, managing finances) independently Call provider office for new concerns or questions  schedule appointment with eye doctor check blood sugar at prescribed times: as directed by Dr.  check feet daily for cuts, sores or redness enter blood sugar readings and medication or insulin into daily log drink 6 to 8 glasses of water each day fill half of plate with vegetables manage portion size  Follow Up Plan:  Telephone follow up appointment with care management team member scheduled for:  06/28/22      , RN, BSN, CCM Care Management Coordinator [facility]  Direct Phone: [phone]

## 2022-06-22 NOTE — Chronic Care Management (AMB) (Signed)
Chronic Care Management   CCM RN Visit Note  06/22/2022 Name: Sherri [name] MRN: [id number] DOB: 05-Apr-1966  Subjective: Sherri [name] is a 56 y.o. year old female who is a primary care patient of Arnette Felts, FNP. The care management team was consulted for assistance with disease management and care coordination needs.    Engaged with patient by telephone for follow up visit in response to provider referral for case management and/or care coordination services.   Consent to Services:  The patient was given information about Chronic Care Management services, agreed to services, and gave verbal consent prior to initiation of services.  Please see initial visit note for detailed documentation.   Patient agreed to services and verbal consent obtained.   Assessment: Review of patient past medical history, allergies, medications, health status, including review of consultants reports, laboratory and other test data, was performed as part of comprehensive evaluation and provision of chronic care management services.   SDOH (Social Determinants of Health) assessments and interventions performed:  Yes, transportation assistance provided per [facility] care guide   CCM Care Plan  Allergies  Allergen Reactions   Ibuprofen Other (See Comments)    Does not take due to hx of renal insufficiency "I have kidney disease"    Lemon Flavor Swelling    Severe Lip Swelling  FRUIT per pt.     Amoxicillin Diarrhea and Other (See Comments)   Tylenol [Acetaminophen] Hives    Cannot take large quantities    Outpatient Encounter Medications as of 06/22/2022  Medication Sig   albuterol (VENTOLIN HFA) 108 (90 Base) MCG/ACT inhaler TAKE 2 PUFFS BY MOUTH EVERY 6 HOURS AS NEEDED FOR WHEEZE OR SHORTNESS OF BREATH (Patient taking differently: Inhale 2 puffs into the lungs every 6 (six) hours as needed for wheezing or shortness of breath.)   Ascorbic Acid (VITAMIN C) 1000 MG tablet TAKE 1 TABLET BY MOUTH EVERY DAY    baclofen (LIORESAL) 20 MG tablet TAKE 1 TABLET BY MOUTH 3  TIMES DAILY   cetirizine (ZYRTEC ALLERGY) 10 MG tablet Take 1 tablet (10 mg total) by mouth daily.   Cholecalciferol (VITAMIN D3) 10 MCG (400 UNIT) tablet Take 400 Units by mouth daily.   Continuous Blood Gluc Sensor (FREESTYLE LIBRE 2 SENSOR) MISC 1 Device by Does not apply route every 14 (fourteen) days. (Patient not taking: Reported on 03/09/2022)   Continuous Blood Gluc Transmit (DEXCOM G6 TRANSMITTER) MISC 1 Device by Does not apply route as directed. (Patient not taking: Reported on 03/07/2022)   Cranberry-Vitamin C-Vitamin E 4200-20-3 MG-MG-UNIT CAPS Take 1 tablet by mouth daily.   desloratadine (CLARINEX) 5 MG tablet TAKE 1 TABLET BY MOUTH DAILY   diazepam (VALIUM) 10 MG tablet TAKE 2 TABLETS BY MOUTH IN THE  MORNING, AT NOON AND AT BEDTIME   EPINEPHRINE 0.3 mg/0.3 mL IJ SOAJ injection INJECT INTRAMUSCULARLY 1 PEN AS  NEEDED FOR ALLERGIC RESPONSE AS  DIRECTED BY MD. SEEK MEDICAL  HELP AFTER USE.   famotidine (PEPCID) 20 MG tablet TAKE 1 TABLET BY MOUTH  TWICE DAILY   FLUoxetine (PROZAC) 10 MG capsule TAKE 1 CAPSULE BY MOUTH DAILY   fluticasone (FLONASE) 50 MCG/ACT nasal spray Place 2 sprays into both nostrils daily.   gabapentin (NEURONTIN) 300 MG capsule TAKE 1 CAPSULE BY MOUTH TWICE  DAILY   Glucagon (GVOKE HYPOPEN 2-PACK) 0.5 MG/0.1ML SOAJ Inject 0.1 mLs into the skin as needed (if blood sugar less than 70).   glucose blood test strip Use as instructed to check  sugars qac and qhs   hydrOXYzine (VISTARIL) 100 MG capsule TAKE 1 CAPSULE BY MOUTH 3  TIMES DAILY AS NEEDED FOR  ITCHING (Patient taking differently: Take 100 mg by mouth 3 (three) times daily as needed for itching.)   insulin aspart (NOVOLOG FLEXPEN) 100 UNIT/ML FlexPen Max daily 30 units   insulin degludec (TRESIBA) 100 UNIT/ML FlexTouch Pen Inject 10 Units into the skin daily.   Insulin Pen Needle 32G X 4 MM MISC 1 each by Does not apply route 2 (two) times daily. Dx  E11.9   Multiple Vitamin (MULTIVITAMIN WITH MINERALS) TABS tablet Take 1 tablet by mouth daily.   vitamin E 180 MG (400 UNITS) capsule Take 400 Units by mouth daily.    No facility-administered encounter medications on file as of 06/22/2022.    Patient Active Problem List   Diagnosis Date Noted   Type 1 diabetes mellitus with hyperglycemia (HCC) 11/11/2021   Malnutrition of moderate degree 09/09/2021   AMS (altered mental status) 09/09/2021   Acute encephalopathy 09/08/2021   Acute lower UTI 09/08/2021   Elevated LFTs 09/08/2021   Elevated lipase 09/08/2021   Hashimoto's disease 08/09/2021   Hypoglycemia 05/05/2021   AKI (acute kidney injury) (HCC) 05/05/2021   Anxiety    Chronic rhinitis 04/24/2019   Keloid 10/10/2018   Depression 01/02/2017   History of DVT (deep vein thrombosis) 01/02/2017   Chronic pain syndrome 01/02/2017   Cocaine abuse (HCC) 01/02/2017   Dysphagia    Adjustment insomnia    Distal radius fracture, right 08/16/2016   Postconcussion syndrome 12/30/2014   Tremor 03/11/2014   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   Hiatal hernia 03/21/2013   Anemia, iron deficiency 01/29/2013   Spasm of muscle 09/02/2012   Anemia 08/28/2012   Mild intermittent asthma 08/27/2012   Chronic back pain    Pernicious anemia 10/30/2011   DEGENERATIVE DISC DISEASE 08/18/2010   Asthma with bronchitis 07/28/2010   Recurrent urticaria 07/28/2010    Conditions to be addressed/monitored: DMI, Stiff-Person Syndrome, Asthma with bronchitis, GERD, Depression, Hashimoto's disease  Care Plan : Transportation needs  Updates made by [name], RN since 06/22/2022 12:00 AM  Completed 06/22/2022   Problem: Transportation Assistance needed Resolved 06/22/2022  Priority: High     Goal: Transportation Assistance needed Completed 06/22/2022  Start Date: 07/13/2021  Expected End Date: 10/13/2021  Recent Progress: On track  Priority: High  Note:   Current Barriers:  Ineffective Self  Health Maintenance in a patient with DMII, Stiff-Person Syndrome, Asthma with bronchitis, GERD, Depression Medical transportation resources needed   Clinical Goal(s):  Collaboration with Arnette Felts, FNP regarding development and update of comprehensive plan of care as evidenced by provider attestation and co-signature Inter-disciplinary care team collaboration (see longitudinal plan of care) patient will work with care management team to address care coordination and chronic disease management needs related to Disease Management Educational Needs Care Coordination Medication Management and Education Psychosocial Support   Interventions:  07/13/21 completed inbound call with patient  Evaluation of current treatment plan related to Transportation self-management and patient's adherence to plan as established by provider. Collaboration with Arnette Felts, FNP regarding development and update of comprehensive plan of care as evidenced by provider attestation       and co-signature Inter-disciplinary care team collaboration (see longitudinal plan of care) Determined patient may need assistance with medical transportation Determined patient's car is expected to be repossessed by the dealership in the near future and or as early as this afternoon Sent [facility]  community resource referral marked Emergent in order for transportation resources to be offered to patient, patient made aware to expect a call from a Care Guide team member in order to discuss resources for transportation  Discussed plans with patient for ongoing care management follow up and provided patient with direct contact information for care management team Self Care Activities:  Self administers medications as prescribed Attends all scheduled provider appointments Calls pharmacy for medication refills Calls provider office for new concerns or questions Patient Goals: - work with Care guide for transportation assistance   Follow Up  Plan: Telephone follow up appointment with care management team member scheduled for: 08/10/21     Care Plan : RN Care Manager Plan of Care  Updates made by [name], RN since 06/22/2022 12:00 AM     Problem: No plan of care established for management of chronic disease states (DMI, Stiff-Person Syndrome, Asthma with bronchitis, GERD, Depression, Hashimoto's disease)   Priority: High     Long-Range Goal: Establishment of plan of care for management of chronic disease states (DMI, Stiff-Person Syndrome, Asthma with bronchitis, GERD, Depression, Hashimoto's disease)   Start Date: 06/22/2022  Expected End Date: 06/22/2023  Recent Progress: On track  Priority: High  Note:   Current Barriers:  Knowledge Deficits related to plan of care for management of DMI, Stiff-Person Syndrome, Asthma with bronchitis, GERD, Depression, Hashimoto's disease  Chronic Disease Management support and education needs related to DMI, Stiff-Person Syndrome, Asthma with bronchitis, GERD, Depression, Hashimoto's disease   RNCM Clinical Goal(s):  Patient will verbalize basic understanding of  DMI, Stiff-Person Syndrome, Asthma with bronchitis, GERD, Depression, Hashimoto's disease disease process and self health management plan as evidenced by patient will report having 100% adherence to her prescribed treatment plan related to her chronic disease states as listed above  demonstrate Improved adherence to prescribed treatment plan for DMI, Stiff-Person Syndrome, Asthma with bronchitis, GERD, Depression, Hashimoto's disease as evidenced by patient will demonstrate improved understanding of prescribed medications and rationale for usage as evidenced by patient teach back  continue to work with RN Care Manager to address care management and care coordination needs related to  DMI, Stiff-Person Syndrome, Asthma with bronchitis, GERD, Depression, Hashimoto's disease as evidenced by adherence to CM Team Scheduled  appointments demonstrate ongoing self health care management ability   as evidenced by    through collaboration with RN Care manager, provider, and care team.   Interventions: 1:1 collaboration with primary care provider regarding development and update of comprehensive plan of care as evidenced by provider attestation and co-signature Inter-disciplinary care team collaboration (see longitudinal plan of care) Evaluation of current treatment plan related to  self management and patient's adherence to plan as established by provider  Diabetes Interventions:  (Status:  New goal.) Long Term Goal Assessed patient's understanding of A1c goal:  <7.5 % Provided education to patient about basic DM disease process Reviewed medications with patient and discussed importance of medication adherence Counseled on importance of regular laboratory monitoring as prescribed Advised patient, providing education and rationale, to check cbg as directed by Dr. Lonzo Cloud and record, calling Dr. Lonzo Cloud for findings outside established parameters Review of patient status, including review of consultants reports, relevant laboratory and other test results, and medications completed Assessed for SDOH barriers, determined patient needs assistance with transportation, sent referral to [facility] Care Guide requesting further assistance, marked high priority due to patient has several upcoming appointments   Reviewed scheduled/upcoming provider appointment including: next Endocrinology follow up appointment  scheduled with Dr. Lonzo Cloud on 07/07/22 @2 :00 PM  Lab Results  Component Value Date   HGBA1C 11.3 (A) 03/07/2022   Dental care interventions:  (Status:  New goal.)  Long Term Goal Evaluation of current treatment plan related to  dental care , self-management and patient's adherence to plan as established by provider Determined patient reports following up with Dr. 03/09/2022 at [facility] for treatment of several  dental needs Discussed patient will need several tooth extractions that will require antibiotic prophylaxis and potentially may require a hospital admission  Discussed Dr. [facility] faxed a form to be completed for clearance, however the form was sent to Dr. Katrinka Blazing, Endocrinology Advised patient she may need to see her PCP Lonzo Cloud FNP before her scheduled appointment in August due to being behind with her visits Instructed patient to contact Dr. September to provide the fax number for [facility] and refax to her PCP provider Collaborated with PCP regarding patient's upcoming dental procedure and clearance needed prior to the appointment Discussed PCP scheduled patient for an earlier appointment for Monday, 06/26/22 @11 :20 AM Placed outbound call to patient to advise, left a voice message for patient to call this RN CM to confirm she will keep the appointment as scheduled with PCP for 06/26/22 @11 :20 AM Discussed plans with patient for ongoing care management follow up and provided patient with direct contact information for care management team Inbound call from patient Received call from patient stating she spoke with the [facility] care guide and has transportation arranged for her PCP appointment with 08/27/22 FNP on Monday, 06/26/22 Sent in basket message to PCP and staff making them aware patient confirmed her appointment for 06/26/22 @11 :20 AM with Thursday FNP Reviewed scheduled/upcoming provider appointments including: next RN CM follow up call scheduled for 06/28/22 @2 :15 PM Discussed plans with patient for ongoing care management follow up and provided patient with direct contact information for care management team  Patient Goals/Self-Care Activities: Take all medications as prescribed Attend all scheduled provider appointments Call pharmacy for medication refills 3-7 days in advance of running out of medications Perform all self care activities independently  Perform IADL's (shopping, preparing  meals, housekeeping, managing finances) independently Call provider office for new concerns or questions  schedule appointment with eye doctor check blood sugar at prescribed times: as directed by Dr. 08/27/22  check feet daily for cuts, sores or redness enter blood sugar readings and medication or insulin into daily log drink 6 to 8 glasses of water each day fill half of plate with vegetables manage portion size  Follow Up Plan:  Telephone follow up appointment with care management team member scheduled for:  06/28/22      Arnette Felts, RN, BSN, CCM Care Management Coordinator [facility] Care Management/[facility]  Direct Phone: [phone]

## 2022-06-22 NOTE — Patient Instructions (Signed)
Visit Information   Thank you for taking time to visit with me today. Please don't hesitate to contact me if I can be of assistance to you before our next scheduled telephone appointment.  Following are the goals we discussed today:  Take all medications as prescribed Attend all scheduled provider appointments Call pharmacy for medication refills 3-7 days in advance of running out of medications Perform all self care activities independently  Perform IADL's (shopping, preparing meals, housekeeping, managing finances) independently Call provider office for new concerns or questions  schedule appointment with eye doctor check blood sugar at prescribed times: as directed by Dr. Kelton Pillar  check feet daily for cuts, sores or redness enter blood sugar readings and medication or insulin into daily log drink 6 to 8 glasses of water each day fill half of plate with vegetables manage portion size  Our next appointment is by telephone on 06/28/22 at 2:15 PM   Please call the care guide team at [phone] if you need to cancel or reschedule your appointment.   If you are experiencing a Mental Health or [place] or need someone to talk to, please call 1-800-273-TALK (toll free, 24 hour hotline)   Following is a copy of your full care plan:  Care Plan : Transportation needs  Updates made by Lynne Logan, RN since 06/22/2022 12:00 AM  Completed 06/22/2022   Problem: Transportation Assistance needed Resolved 06/22/2022  Priority: High     Goal: Transportation Assistance needed Completed 06/22/2022  Start Date: 07/13/2021  Expected End Date: 10/13/2021  Recent Progress: On track  Priority: High  Note:   Current Barriers:  Ineffective Self Health Maintenance in a patient with DMII, Stiff-Person Syndrome, Asthma with bronchitis, GERD, Depression Medical transportation resources needed   Clinical Goal(s):  Collaboration with Minette Brine, FNP regarding development and update  of comprehensive plan of care as evidenced by provider attestation and co-signature Inter-disciplinary care team collaboration (see longitudinal plan of care) patient will work with care management team to address care coordination and chronic disease management needs related to Disease Management Educational Needs Care Coordination Medication Management and Education Psychosocial Support   Interventions:  07/13/21 completed inbound call with patient  Evaluation of current treatment plan related to Transportation self-management and patient's adherence to plan as established by provider. Collaboration with Minette Brine, FNP regarding development and update of comprehensive plan of care as evidenced by provider attestation       and co-signature Inter-disciplinary care team collaboration (see longitudinal plan of care) Determined patient may need assistance with medical transportation Determined patient's car is expected to be repossessed by the dealership in the near future and or as early as this afternoon Sent [facility] community resource referral marked Emergent in order for transportation resources to be offered to patient, patient made aware to expect a call from a Care Guide team member in order to discuss resources for transportation  Discussed plans with patient for ongoing care management follow up and provided patient with direct contact information for care management team Self Care Activities:  Self administers medications as prescribed Attends all scheduled provider appointments Calls pharmacy for medication refills Calls provider office for new concerns or questions Patient Goals: - work with Care guide for transportation assistance   Follow Up Plan: Telephone follow up appointment with care management team member scheduled for: 08/10/21     Care Plan : RN Care Manager Plan of Care  Updates made by Lynne Logan, RN since 06/22/2022 12:00 AM  Problem: No plan of care  established for management of chronic disease states (DMI, Stiff-Person Syndrome, Asthma with bronchitis, GERD, Depression, Hashimoto's disease)   Priority: High     Long-Range Goal: Establishment of plan of care for management of chronic disease states (DMI, Stiff-Person Syndrome, Asthma with bronchitis, GERD, Depression, Hashimoto's disease)   Start Date: 06/22/2022  Expected End Date: 06/22/2023  This Visit's Progress: On track  Priority: High  Note:   Current Barriers:  Knowledge Deficits related to plan of care for management of DMI, Stiff-Person Syndrome, Asthma with bronchitis, GERD, Depression, Hashimoto's disease  Chronic Disease Management support and education needs related to DMI, Stiff-Person Syndrome, Asthma with bronchitis, GERD, Depression, Hashimoto's disease   RNCM Clinical Goal(s):  Patient will verbalize basic understanding of  DMI, Stiff-Person Syndrome, Asthma with bronchitis, GERD, Depression, Hashimoto's disease disease process and self health management plan as evidenced by patient will report having 100% adherence to her prescribed treatment plan related to her chronic disease states as listed above  demonstrate Improved adherence to prescribed treatment plan for DMI, Stiff-Person Syndrome, Asthma with bronchitis, GERD, Depression, Hashimoto's disease as evidenced by patient will demonstrate improved understanding of prescribed medications and rationale for usage as evidenced by patient teach back  continue to work with RN Care Manager to address care management and care coordination needs related to  DMI, Stiff-Person Syndrome, Asthma with bronchitis, GERD, Depression, Hashimoto's disease as evidenced by adherence to CM Team Scheduled appointments demonstrate ongoing self health care management ability   as evidenced by    through collaboration with RN Care manager, provider, and care team.   Interventions: 1:1 collaboration with primary care provider regarding  development and update of comprehensive plan of care as evidenced by provider attestation and co-signature Inter-disciplinary care team collaboration (see longitudinal plan of care) Evaluation of current treatment plan related to  self management and patient's adherence to plan as established by provider  Diabetes Interventions:  (Status:  New goal.) Long Term Goal Assessed patient's understanding of A1c goal:  <7.5 % Provided education to patient about basic DM disease process Reviewed medications with patient and discussed importance of medication adherence Counseled on importance of regular laboratory monitoring as prescribed Advised patient, providing education and rationale, to check cbg as directed by Dr. Kelton Pillar and record, calling Dr. Kelton Pillar for findings outside established parameters Review of patient status, including review of consultants reports, relevant laboratory and other test results, and medications completed Assessed for SDOH barriers, determined patient needs assistance with transportation, sent referral to [facility] requesting further assistance, marked high priority due to patient has several upcoming appointments   Reviewed scheduled/upcoming provider appointment including: next Endocrinology follow up appointment scheduled with Dr. Kelton Pillar on 07/07/22 $RemoveBe'@2'JLpXCLyGZ$ :00 PM  Lab Results  Component Value Date   HGBA1C 11.3 (A) 03/07/2022   Dental care interventions:  (Status:  New goal.)  Long Term Goal Evaluation of current treatment plan related to  dental care , self-management and patient's adherence to plan as established by provider Determined patient reports following up with Dr. Tamala Julian at [facility] for treatment of several dental needs Discussed patient will need several tooth extractions that will require antibiotic prophylaxis and potentially may require a hospital admission  Discussed Dr. Tamala Julian faxed a form to be completed for clearance, however the  form was sent to Dr. Kelton Pillar, Endocrinology Advised patient she may need to see her PCP Minette Brine FNP before her scheduled appointment in August due to being behind with her visits  Instructed patient to contact Dr. Tamala Julian to provide the fax number for [facility] and refax to her PCP provider Collaborated with PCP regarding patient's upcoming dental procedure and clearance needed prior to the appointment Discussed PCP scheduled patient for an earlier appointment for Monday, 06/26/22 $RemoveBefore'@11'SjZyuEruOyufK$ :20 AM Placed outbound call to patient to advise, left a voice message for patient to call this RN CM to confirm she will keep the appointment as scheduled with PCP for 06/26/22 $RemoveBe'@11'AbPLeXviP$ :20 AM Discussed plans with patient for ongoing care management follow up and provided patient with direct contact information for care management team  Patient Goals/Self-Care Activities: Take all medications as prescribed Attend all scheduled provider appointments Call pharmacy for medication refills 3-7 days in advance of running out of medications Perform all self care activities independently  Perform IADL's (shopping, preparing meals, housekeeping, managing finances) independently Call provider office for new concerns or questions  schedule appointment with eye doctor check blood sugar at prescribed times: as directed by Dr. Kelton Pillar  check feet daily for cuts, sores or redness enter blood sugar readings and medication or insulin into daily log drink 6 to 8 glasses of water each day fill half of plate with vegetables manage portion size  Follow Up Plan:  Telephone follow up appointment with care management team member scheduled for:  06/28/22       Consent to CCM Services: Ms. Guggisberg was given information about Chronic Care Management services including:  CCM service includes personalized support from designated clinical staff supervised by her physician, including individualized plan of care and coordination with other care  providers 24/7 contact phone numbers for assistance for urgent and routine care needs. Service will only be billed when office clinical staff spend 20 minutes or more in a month to coordinate care. Only one practitioner may furnish and bill the service in a calendar month. The patient may stop CCM services at any time (effective at the end of the month) by phone call to the office staff. The patient will be responsible for cost sharing (co-pay) of up to 20% of the service fee (after annual deductible is met).  Patient agreed to services and verbal consent obtained.   Patient verbalizes understanding of instructions and care plan provided today and agrees to view in [employer]. Active MyChart status and patient understanding of how to access instructions and care plan via MyChart confirmed with patient.     Telephone follow up appointment with care management team member scheduled for: 06/28/22

## 2022-06-22 NOTE — Patient Instructions (Signed)
Visit Information  Thank you for taking time to visit with me today. Please don't hesitate to contact me if I can be of assistance to you before our next scheduled telephone appointment.  Following are the goals we discussed today:  Take all medications as prescribed Attend all scheduled provider appointments Call pharmacy for medication refills 3-7 days in advance of running out of medications Perform all self care activities independently  Perform IADL's (shopping, preparing meals, housekeeping, managing finances) independently Call provider office for new concerns or questions  schedule appointment with eye doctor check blood sugar at prescribed times: as directed by Dr. Lonzo Cloud  check feet daily for cuts, sores or redness enter blood sugar readings and medication or insulin into daily log drink 6 to 8 glasses of water each day fill half of plate with vegetable  Our next appointment is by telephone on 06/28/22 at 2:15 PM  Please call the care guide team at [phone] if you need to cancel or reschedule your appointment.   If you are experiencing a Mental Health or Behavioral Health Crisis or need someone to talk to, please call 1-800-273-TALK (toll free, 24 hour hotline)   Patient verbalizes understanding of instructions and care plan provided today and agrees to view in MyChart. Active MyChart status and patient understanding of how to access instructions and care plan via MyChart confirmed with patient.     Delsa Sale, RN, BSN, CCM Care Management Coordinator [facility] Care Management/[facility]  Direct Phone: [phone]

## 2022-06-23 DIAGNOSIS — J45909 Unspecified asthma, uncomplicated: Secondary | ICD-10-CM

## 2022-06-23 DIAGNOSIS — F32A Depression, unspecified: Secondary | ICD-10-CM

## 2022-06-23 DIAGNOSIS — E1021 Type 1 diabetes mellitus with diabetic nephropathy: Secondary | ICD-10-CM

## 2022-06-26 ENCOUNTER — Ambulatory Visit: Payer: Medicare Other | Admitting: Nurse Practitioner

## 2022-06-28 ENCOUNTER — Telehealth: Payer: Medicare Other

## 2022-06-29 ENCOUNTER — Ambulatory Visit: Payer: Medicare Other | Admitting: Nurse Practitioner

## 2022-06-29 ENCOUNTER — Ambulatory Visit: Payer: Self-pay

## 2022-06-29 DIAGNOSIS — G2582 Stiff-man syndrome: Secondary | ICD-10-CM

## 2022-06-29 DIAGNOSIS — J45909 Unspecified asthma, uncomplicated: Secondary | ICD-10-CM

## 2022-06-29 DIAGNOSIS — E1021 Type 1 diabetes mellitus with diabetic nephropathy: Secondary | ICD-10-CM

## 2022-06-29 DIAGNOSIS — E063 Autoimmune thyroiditis: Secondary | ICD-10-CM

## 2022-06-29 DIAGNOSIS — K219 Gastro-esophageal reflux disease without esophagitis: Secondary | ICD-10-CM

## 2022-06-29 NOTE — Chronic Care Management (AMB) (Signed)
  Care Management   Follow Up Note   06/29/2022 Name: Sherri [name] MRN: [id number] DOB: [birth year]   Referred by: Arnette Felts, FNP Reason for referral : Chronic Care Management (Case Closure)  Received notification from PCP provider Arnette Felts FNP advising Sherri [name] has been discharged from the practice. The patient was referred to the case management team for assistance with care management and care coordination. The patient will be closed from CCM due to being discharged from the practice.   Follow Up Plan: No further follow up required:    Delsa Sale, RN, BSN, CCM Care Management Coordinator [facility] Care Management/[facility]  Direct Phone: [phone]

## 2022-07-03 ENCOUNTER — Telehealth: Payer: Self-pay | Admitting: Neurology

## 2022-07-03 MED ORDER — DIAZEPAM 10 MG PO TABS: ORAL_TABLET | ORAL | 0 refills | Status: DC

## 2022-07-03 NOTE — Telephone Encounter (Signed)
Pt would like a call from the nurse to discuss needing a diazepam (VALIUM) 10 MG tablet refill before appt on 09/12/22 with Dr. Marjory Lies.

## 2022-07-03 NOTE — Telephone Encounter (Unsigned)
Meds ordered this encounter  Medications   diazepam (VALIUM) 10 MG tablet    Sig: TAKE 2 TABLETS BY MOUTH IN THE  MORNING, AT NOON AND AT BEDTIME    Dispense:  180 tablet    Refill:  0

## 2022-07-03 NOTE — Telephone Encounter (Signed)
[place] drug registry has been verified. Last refill was 05/24/2022 # 180 for a 30 day supply. Last visit was 08/12/2020 with Maralyn Sago, next f/u is 09/12/2022 with Dr. Marjory Lies.   Dr. Marjory Lies and Maralyn Sago are both out of the office today. Will send request to work in [place].

## 2022-07-05 ENCOUNTER — Telehealth: Payer: Self-pay

## 2022-07-05 NOTE — Telephone Encounter (Signed)
  Care Management   Follow Up Note   07/05/2022 Name: Sherri [name] MRN: [id number] DOB: Apr 30, 1966   Referred by: Arnette Felts, FNP Reason for referral : No chief complaint on file.  Received voice message from patient requesting a return call.  An unsuccessful telephone outreach was attempted today. The patient was referred to the case management team for assistance with care management and care coordination.   Follow Up Plan: Next PCP appointment scheduled for:  08/07/22  Delsa Sale, RN, BSN, CCM Care Management Coordinator [facility] Care Management/[facility]  Direct Phone: [phone]

## 2022-07-07 ENCOUNTER — Ambulatory Visit (INDEPENDENT_AMBULATORY_CARE_PROVIDER_SITE_OTHER): Payer: Medicare Other | Admitting: Internal Medicine

## 2022-07-07 ENCOUNTER — Encounter: Payer: Self-pay | Admitting: Internal Medicine

## 2022-07-07 VITALS — BP 122/74 | HR 85 | Ht 63.0 in | Wt 109.0 lb

## 2022-07-07 DIAGNOSIS — E1021 Type 1 diabetes mellitus with diabetic nephropathy: Secondary | ICD-10-CM | POA: Diagnosis not present

## 2022-07-07 DIAGNOSIS — E063 Autoimmune thyroiditis: Secondary | ICD-10-CM | POA: Diagnosis not present

## 2022-07-07 DIAGNOSIS — E1065 Type 1 diabetes mellitus with hyperglycemia: Secondary | ICD-10-CM | POA: Diagnosis not present

## 2022-07-07 DIAGNOSIS — E1022 Type 1 diabetes mellitus with diabetic chronic kidney disease: Secondary | ICD-10-CM

## 2022-07-07 DIAGNOSIS — N1831 Chronic kidney disease, stage 3a: Secondary | ICD-10-CM

## 2022-07-07 LAB — POCT GLYCOSYLATED HEMOGLOBIN (HGB A1C): Hemoglobin A1C: 12.2 % — AB (ref 4.0–5.6)

## 2022-07-07 NOTE — Progress Notes (Signed)
Name: Sherri [name]  MRN/ DOB: [id number], [birth year]   Age/ Sex: 56 y.o., female    PCP: Minette Brine, FNP   Reason for Endocrinology Evaluation: Type 2 Diabetes Mellitus     Date of Initial Endocrinology Visit: 08/08/2021    PATIENT IDENTIFIER: [name] is a 56 y.o. female with a past medical history of T2DM, CKD, hx of coccaine abuse, depression, DVT . The patient presented for initial endocrinology clinic visit on 08/08/2021 for consultative assistance with her diabetes management.     DIABETIC HISTORY:  [name] was diagnosed with T2DM in 2021, she was initially started on oral glycemic agents, but subsequently required insulin due to persistent hyperglycemia.Her hemoglobin A1c has ranged from  6.4% in 2021, peaking at 13.0% in 2022.  On her initial visit to our clinic she had an A1c of 12%, she was on Farxiga, metformin, glipizide, Tresiba, and metformin.  We stopped metformin and glipizide, continued [place] and Farxiga, and started prandial insulin  Her diagnosis was changed from type 2 DM to type I DM based on elevated islet cell antibody titers at 5120 and GAD-65 > 250   Was diagnosed with stiff person syndrome in 2021, follows with Dr. Jannifer Franklin She was also found to have en elevated Anti-TPO Ab's at 37 IU/Ml ( reference 0-34)      SUBJECTIVE:   During the last visit (03/07/2022): A1c 11.3%, adjusted mdi regimen stopped farxiga     Today (08/08/2021): [name] is here for follow-up on diabetes management.  She checks her blood sugars occasionally . The patient has had hypoglycemic episodes since the last clinic visit.   She eats 3 meals a day  and she snacks as well  Denies nausea, vomiting or diarrhea   This morning her fasting was> 400    HOME DIABETES REGIMEN: Tresiba 10 units daily  Novolog  6 units with each meal Correction factor : Humalog (BG -130/50)     Statin: no ACE-I/ARB: no    METER DOWNLOAD SUMMARY: unable to  download 46-500  mg/dL    DIABETIC COMPLICATIONS: Microvascular complications:  CKD III, neuropathy Denies: retinopathy Last eye exam: Completed > 2 yrs ago   Macrovascular complications:   Denies: CAD, PVD, CVA   PAST HISTORY: Past Medical History:  Past Medical History:  Diagnosis Date   Angioedema    Anxiety    Arthritis    Asthma    Benzodiazepine withdrawal ([place]) 08/30/2012   CAP (community acquired pneumonia) 08/29/2012   Chronic back pain    Chronic kidney disease    Closed nondisplaced fracture of proximal phalanx of right little finger 10/18/2018   Cocaine abuse ([place])    Depression    Diarrhea    DVT (deep venous thrombosis) ([facility])    Environmental allergies    Fall    GERD (gastroesophageal reflux disease)    Heart murmur    has been told once that she has a heart murmur, but has never had any problems   Hiatal hernia 03/21/2013   Lumbar herniated disc    Peripheral vascular disease ([place])    Pernicious anemia 10/30/2011   Postconcussion syndrome 12/30/2014   Rhabdomyolysis 08/27/2012   Seasonal allergies    Stiff person syndrome    Urticaria    Past Surgical History:  Past Surgical History:  Procedure Laterality Date   BUNIONECTOMY Left    colonoscopy  07/09/15   inguinal hernia 1983  1983   OPEN REDUCTION INTERNAL FIXATION (ORIF) DISTAL RADIAL FRACTURE  Right 08/16/2016   Procedure: OPEN REDUCTION INTERNAL FIXATION (ORIF) DISTAL RADIAL FRACTURE;  Surgeon: Eldred Manges, MD;  Location: [facility] OR;  Service: Orthopedics;  Laterality: Right;    Social History:  reports that she has never smoked. She has never used smokeless tobacco. She reports that she does not currently use alcohol. She reports that she does not currently use drugs after having used the following drugs: Cocaine. Family History:  Family History  Problem Relation Age of Onset   Cancer Mother    Other Mother    COPD Father    Asthma Brother    Cancer Brother        colon   Heart attack Brother     Seizures Brother    Allergic rhinitis Neg Hx    Angioedema Neg Hx    Eczema Neg Hx    Immunodeficiency Neg Hx    Urticaria Neg Hx      HOME MEDICATIONS: Allergies as of 07/07/2022       Reactions   Ibuprofen Other (See Comments)   Does not take due to hx of renal insufficiency "I have kidney disease"    Lemon Flavor Swelling   Severe Lip Swelling  FRUIT per pt.     Amoxicillin Diarrhea, Other (See Comments)   Tylenol [acetaminophen] Hives   Cannot take large quantities        Medication List        Accurate as of July 07, 2022  2:29 PM. If you have any questions, ask your nurse or doctor.          albuterol 108 (90 Base) MCG/ACT inhaler Commonly known as: VENTOLIN HFA TAKE 2 PUFFS BY MOUTH EVERY 6 HOURS AS NEEDED FOR WHEEZE OR SHORTNESS OF BREATH What changed: See the new instructions.   baclofen 20 MG tablet Commonly known as: LIORESAL TAKE 1 TABLET BY MOUTH 3  TIMES DAILY   cetirizine 10 MG tablet Commonly known as: ZyrTEC Allergy Take 1 tablet (10 mg total) by mouth daily.   Cranberry-Vitamin C-Vitamin E 4200-20-3 MG-MG-UNIT Caps Take 1 tablet by mouth daily.   desloratadine 5 MG tablet Commonly known as: CLARINEX TAKE 1 TABLET BY MOUTH DAILY   Dexcom G6 Transmitter Misc 1 Device by Does not apply route as directed.   diazepam 10 MG tablet Commonly known as: VALIUM TAKE 2 TABLETS BY MOUTH IN THE  MORNING, AT NOON AND AT BEDTIME   EPINEPHrine 0.3 mg/0.3 mL Soaj injection Commonly known as: EPI-PEN INJECT INTRAMUSCULARLY 1 PEN AS  NEEDED FOR ALLERGIC RESPONSE AS  DIRECTED BY MD. SEEK MEDICAL  HELP AFTER USE.   famotidine 20 MG tablet Commonly known as: PEPCID TAKE 1 TABLET BY MOUTH  TWICE DAILY   FLUoxetine 10 MG capsule Commonly known as: PROZAC TAKE 1 CAPSULE BY MOUTH DAILY   fluticasone 50 MCG/ACT nasal spray Commonly known as: FLONASE Place 2 sprays into both nostrils daily.   FreeStyle Libre 2 Sensor Misc 1 Device by Does not  apply route every 14 (fourteen) days.   gabapentin 300 MG capsule Commonly known as: NEURONTIN TAKE 1 CAPSULE BY MOUTH TWICE  DAILY   glucose blood test strip Use as instructed to check sugars qac and qhs   Gvoke HypoPen 2-Pack 0.5 MG/0.1ML Soaj Generic drug: Glucagon Inject 0.1 mLs into the skin as needed (if blood sugar less than 70).   hydrOXYzine 100 MG capsule Commonly known as: VISTARIL TAKE 1 CAPSULE BY MOUTH 3  TIMES DAILY AS NEEDED FOR  ITCHING What changed:  how much to take how to take this when to take this reasons to take this additional instructions   insulin degludec 100 UNIT/ML FlexTouch Pen Commonly known as: TRESIBA Inject 10 Units into the skin daily.   Insulin Pen Needle 32G X 4 MM Misc 1 each by Does not apply route 2 (two) times daily. Dx E11.9   multivitamin with minerals Tabs tablet Take 1 tablet by mouth daily.   NovoLOG FlexPen 100 UNIT/ML FlexPen Generic drug: insulin aspart Max daily 30 units   vitamin C 1000 MG tablet TAKE 1 TABLET BY MOUTH EVERY DAY   Vitamin D3 10 MCG (400 UNIT) tablet Take 400 Units by mouth daily.   vitamin E 180 MG (400 UNITS) capsule Take 400 Units by mouth daily.         ALLERGIES: Allergies  Allergen Reactions   Ibuprofen Other (See Comments)    Does not take due to hx of renal insufficiency "I have kidney disease"    Lemon Flavor Swelling    Severe Lip Swelling  FRUIT per pt.     Amoxicillin Diarrhea and Other (See Comments)   Tylenol [Acetaminophen] Hives    Cannot take large quantities     REVIEW OF SYSTEMS: A comprehensive ROS was conducted with the patient and is negative except as per HPI    OBJECTIVE:   VITAL SIGNS: BP 122/74 (BP Location: Left Arm, Patient Position: Sitting, Cuff Size: Small)   Pulse 85   Ht 5\' 3"  (1.6 m)   Wt 109 lb (49.4 kg)   LMP 07/29/2012 (Approximate)   SpO2 99%   BMI 19.31 kg/m    PHYSICAL EXAM:  General: Pt appears well and is in NAD  Lungs: Clear  with good BS bilat with no rales, rhonchi, or wheezes  Heart: RRR with normal S1 and S2 and no gallops; no murmurs; no rub  Extremities:  Lower extremities - No pretibial edema. No lesions.  Neuro: MS is good with appropriate affect, pt is alert and Ox3   DM Foot Exam 03/07/2022  The skin of the feet is intact without sores or ulcerations. The pedal pulses are 2+ on right and 2+ on left. The sensation is intact to a screening 5.07, 10 gram monofilament bilaterally  DATA REVIEWED:  Lab Results  Component Value Date   HGBA1C 12.2 (A) 07/07/2022   HGBA1C 11.3 (A) 03/07/2022   HGBA1C 13.7 (A) 11/09/2021         Latest Reference Range & Units 03/09/22 16:45  Sodium 135 - 145 mmol/L 128 (L)  Potassium 3.5 - 5.1 mmol/L 4.8  Chloride 98 - 111 mmol/L 95 (L)  CO2 22 - 32 mmol/L 24  Glucose 70 - 99 mg/dL 722 (HH)  BUN 6 - 20 mg/dL 25 (H)  Creatinine 0.44 - 1.00 mg/dL 1.22 (H)  Calcium 8.9 - 10.3 mg/dL 9.3  Anion gap 5 - 15  9  Alkaline Phosphatase 38 - 126 U/L 129 (H)  Albumin 3.5 - 5.0 g/dL 3.9  AST 15 - 41 U/L 35  ALT 0 - 44 U/L 41  Total Protein 6.5 - 8.1 g/dL 7.6  Total Bilirubin 0.3 - 1.2 mg/dL 0.4  GFR, Estimated >60 mL/min 52 (L)   Results for HAILLE, DINGMANN (MRN [id number]) as of 11/09/2021 12:49  Ref. Range 08/08/2021 10:58  ISLET CELL ANTIBODY SCREEN Latest Ref Range: NEGATIVE  POSITIVE (A)   Glutamic Acid [place] Ab <5 IU/mL >250 High     ASSESSMENT /  PLAN / RECOMMENDATIONS:   1) Type 1 Diabetes Mellitus, Poorly controlled, With CKD III and neuropathic  complications - Most recent A1c of 12.2%. Goal A1c < 7.0 %.    -Her A1c continues to be elevated, we were unable to download her meter today, I have manually reviewed her glucose meter which shows severe glycemic excursions with BG's ranging from 46 to 600 mg/DL.  Glycemic excursions are usually the result of dietary indiscretion, with erratic insulin intake -The patient does admit to not taking her suppertime NovoLog  at times, she forgets long-acting insulin on average 1-2 times a week. -I have advised the patient that if she does not take her prandial dose of insulin before meal, she should not take it too much.  After the meal as this will increase her risk of hypoglycemia -I have also advised the patient to use the correction scale for Premeal glucose reading rather than postmeal glucose reading as this will again result in hypoglycemia -I am going to relax her sensitivity factor from 50 to 60, because based on her history when she uses it her BG's tend to drop very quickly - Pt with multiple social determinants - Dexcom/ freestyle Josephine Igo  are cost prohibitive  -I have encouraged the patient to get into the habit of checking glucose before the meal, taking prandial insulin, and eating  MEDICATIONS:  -Continue Tresiba 10 units daily  -Continue NovoLog 6 units with each meal -Take NovoLog 2 units with snacks that contains CHO -Change correction factor : NovoLog (BG -130/60)    EDUCATION / INSTRUCTIONS: BG monitoring instructions: Patient is instructed to check her blood sugars 3 times a day, before meals. Call [facility] if: BG persistently < 70  I reviewed the Rule of 15 for the treatment of hypoglycemia in detail with the patient. Literature supplied.   2) Diabetic complications:  Eye: Does not have known diabetic retinopathy.  Neuro/ Feet: Does  have known diabetic peripheral neuropathy. Renal: Patient does  have known baseline CKD. She is not on an ACEI/ARB at present.   3) Hashimoto's Disease:   - TFT's are normal.  -Patient is clinically euthyroid  F/U in 6 months      Signed electronically by: Lyndle Herrlich, MD  [facility]  [facility] [address] [place], [place] [address] Phone: [phone] FAX: [phone]   CC: Arnette Felts, FNP [address] [place] [address] Phone: [phone]   Fax: [phone]    Return to Endocrinology clinic as below: Future Appointments  Date Time Provider Department Center  07/10/2022  4:00 PM Suanne Marker, MD [facility] None  07/19/2022 12:00 PM [facility] PHARMACIST [facility] None  08/07/2022  2:40 PM Arnette Felts, FNP [facility] None  08/17/2022  9:00 AM [facility] None  09/08/2022  1:15 PM [facility] None  09/08/2022  1:30 PM [facility] INJ NURSE [facility] None  11/13/2022  4:00 PM Penumalli, Glenford Bayley, MD [facility] None  03/09/2023  1:00 PM [facility] LAB [facility] None  03/09/2023  1:15 PM [name], NP [facility] None  03/09/2023  1:45 PM [facility] INJ NURSE [facility] None

## 2022-07-07 NOTE — Patient Instructions (Addendum)
-   Continue  Tresiba 10 units daily  - Continue Novolog 6 units with each meal  - Take Novolog 2 units with a snack  - Continue Novolog correctional insulin: ADD extra units on insulin to your meal-time Novolog  dose if your blood sugars are higher than 180. Use the scale below to help guide you:   Blood sugar before meal Number of units to inject  Less than 190 0 unit  191 - 250 1 units  251- 310 2 units  311 - 370 3 units  371 - 430 4 units  431 - 490 5 units  491 - 550 6 units  551 - 610 7 units     Separate Novolog by 3-4 hours from each other    HOW TO TREAT LOW BLOOD SUGARS (Blood sugar LESS THAN 70 MG/DL) Please follow the RULE OF 15 for the treatment of hypoglycemia treatment (when your (blood sugars are less than 70 mg/dL)   STEP 1: Take 15 grams of carbohydrates when your blood sugar is low, which includes:  3-4 GLUCOSE TABS  OR 3-4 OZ OF JUICE OR REGULAR SODA OR ONE TUBE OF GLUCOSE GEL    STEP 2: RECHECK blood sugar in 15 MINUTES STEP 3: If your blood sugar is still low at the 15 minute recheck --> then, go back to STEP 1 and treat AGAIN with another 15 grams of carbohydrates.

## 2022-07-10 ENCOUNTER — Encounter: Payer: Self-pay | Admitting: Diagnostic Neuroimaging

## 2022-07-10 ENCOUNTER — Ambulatory Visit (INDEPENDENT_AMBULATORY_CARE_PROVIDER_SITE_OTHER): Payer: Medicare Other | Admitting: Diagnostic Neuroimaging

## 2022-07-10 VITALS — BP 94/65 | HR 96 | Ht 63.0 in

## 2022-07-10 DIAGNOSIS — G2582 Stiff-man syndrome: Secondary | ICD-10-CM

## 2022-07-10 NOTE — Progress Notes (Signed)
[facility]  PATIENT: Sherri [name] DOB: May 22, 1966  REFERRING CLINICIAN: Arnette Felts, FNP HISTORY FROM: patient REASON FOR VISIT: new consult   HISTORICAL  CHIEF COMPLAINT:  Chief Complaint  Patient presents with   Stiff person syndrome    Rm 6 New pt, TOC Dr Anne Hahn "no new concerns; try to take medicines as prescribed; fell a couple times w/injury to my teeth"    HISTORY OF PRESENT ILLNESS:   UPDATE (07/10/22, VRP): Since last visit, doing about the same. Symptoms are stable. Severity is mild/moderate. No alleviating or aggravating factors. Tolerating meds.     REVIEW OF SYSTEMS: Full 14 system review of systems performed and negative with exception of: as per HPI.  ALLERGIES: Allergies  Allergen Reactions   Ibuprofen Other (See Comments)    Does not take due to hx of renal insufficiency "I have kidney disease"    Lemon Flavor Swelling    Severe Lip Swelling  FRUIT per pt.     Amoxicillin Diarrhea and Other (See Comments)   Tylenol [Acetaminophen] Hives    Cannot take large quantities    HOME MEDICATIONS: Outpatient Medications Prior to Visit  Medication Sig Dispense Refill   albuterol (VENTOLIN HFA) 108 (90 Base) MCG/ACT inhaler TAKE 2 PUFFS BY MOUTH EVERY 6 HOURS AS NEEDED FOR WHEEZE OR SHORTNESS OF BREATH (Patient taking differently: Inhale 2 puffs into the lungs every 6 (six) hours as needed for wheezing or shortness of breath.) 8.5 each 1   Ascorbic Acid (VITAMIN C) 1000 MG tablet TAKE 1 TABLET BY MOUTH EVERY DAY 30 tablet 1   baclofen (LIORESAL) 20 MG tablet TAKE 1 TABLET BY MOUTH 3  TIMES DAILY 90 tablet 0   cetirizine (ZYRTEC ALLERGY) 10 MG tablet Take 1 tablet (10 mg total) by mouth daily. 90 tablet 2   Cholecalciferol (VITAMIN D3) 10 MCG (400 UNIT) tablet Take 400 Units by mouth daily.     Cranberry-Vitamin C-Vitamin E 4200-20-3 MG-MG-UNIT CAPS Take 1 tablet by mouth daily.     desloratadine (CLARINEX) 5 MG tablet TAKE 1 TABLET BY  MOUTH DAILY 30 tablet 1   diazepam (VALIUM) 10 MG tablet TAKE 2 TABLETS BY MOUTH IN THE  MORNING, AT NOON AND AT BEDTIME 180 tablet 0   EPINEPHRINE 0.3 mg/0.3 mL IJ SOAJ injection INJECT INTRAMUSCULARLY 1 PEN AS  NEEDED FOR ALLERGIC RESPONSE AS  DIRECTED BY MD. SEEK MEDICAL  HELP AFTER USE. 2 each 0   famotidine (PEPCID) 20 MG tablet TAKE 1 TABLET BY MOUTH  TWICE DAILY 180 tablet 1   FLUoxetine (PROZAC) 10 MG capsule TAKE 1 CAPSULE BY MOUTH DAILY 90 capsule 3   fluticasone (FLONASE) 50 MCG/ACT nasal spray Place 2 sprays into both nostrils daily. 16 g 0   gabapentin (NEURONTIN) 300 MG capsule TAKE 1 CAPSULE BY MOUTH TWICE  DAILY 180 capsule 3   Glucagon (GVOKE HYPOPEN 2-PACK) 0.5 MG/0.1ML SOAJ Inject 0.1 mLs into the skin as needed (if blood sugar less than 70). 0.2 mL 5   glucose blood test strip Use as instructed to check sugars qac and qhs 100 each 12   hydrOXYzine (VISTARIL) 100 MG capsule TAKE 1 CAPSULE BY MOUTH 3  TIMES DAILY AS NEEDED FOR  ITCHING (Patient taking differently: Take 100 mg by mouth 3 (three) times daily as needed for itching.) 90 capsule 1   insulin aspart (NOVOLOG FLEXPEN) 100 UNIT/ML FlexPen Max daily 30 units 15 mL 11   insulin degludec (TRESIBA) 100 UNIT/ML FlexTouch Pen Inject  10 Units into the skin daily. 15 mL 3   Insulin Pen Needle 32G X 4 MM MISC 1 each by Does not apply route 2 (two) times daily. Dx E11.9 100 each 0   vitamin E 180 MG (400 UNITS) capsule Take 400 Units by mouth daily.      Continuous Blood Gluc Sensor (FREESTYLE LIBRE 2 SENSOR) MISC 1 Device by Does not apply route every 14 (fourteen) days. (Patient not taking: Reported on 07/07/2022) 6 each 3   Continuous Blood Gluc Transmit (DEXCOM G6 TRANSMITTER) MISC 1 Device by Does not apply route as directed. (Patient not taking: Reported on 07/07/2022) 1 each 3   Multiple Vitamin (MULTIVITAMIN WITH MINERALS) TABS tablet Take 1 tablet by mouth daily.     No facility-administered medications prior to visit.       PHYSICAL EXAM  GENERAL EXAM/CONSTITUTIONAL: Vitals:  Vitals:   07/10/22 1544  BP: 94/65  Pulse: 96  Height: 5\' 3"  (1.6 m)   Body mass index is 19.31 kg/m. Wt Readings from Last 3 Encounters:  07/07/22 109 lb (49.4 kg)  03/09/22 114 lb 6.7 oz (51.9 kg)  03/09/22 114 lb 6.4 oz (51.9 kg)   Patient is in no distress; well developed, nourished and groomed; neck is supple  CARDIOVASCULAR: Examination of carotid arteries is normal; no carotid bruits Regular rate and rhythm, no murmurs Examination of peripheral vascular system by observation and palpation is normal  EYES: Ophthalmoscopic exam of optic discs and posterior segments is normal; no papilledema or hemorrhages No results found.  MUSCULOSKELETAL: Gait, strength, tone, movements noted in Neurologic exam below  NEUROLOGIC: MENTAL STATUS:      No data to display         awake, alert, oriented to person, place and time recent and remote memory intact normal attention and concentration language fluent, comprehension intact, naming intact fund of knowledge appropriate  CRANIAL NERVE:  2nd - no papilledema on fundoscopic exam 2nd, 3rd, 4th, 6th - pupils equal and reactive to light, visual fields full to confrontation, extraocular muscles intact, no nystagmus 5th - facial sensation symmetric 7th - facial strength symmetric 8th - hearing intact 9th - palate elevates symmetrically, uvula midline 11th - shoulder shrug symmetric 12th - tongue protrusion midline  MOTOR:  normal bulk and tone, full strength in the BUE, BLE  SENSORY:  normal and symmetric to light touch  COORDINATION:  finger-nose-finger, fine finger movements normal  REFLEXES:  deep tendon reflexes TRACE and symmetric  GAIT/STATION:  narrow based gait; USING CANE     DIAGNOSTIC DATA (LABS, IMAGING, TESTING) - I reviewed patient records, labs, notes, testing and imaging myself where available.  Lab Results  Component Value Date    WBC 5.0 03/09/2022   HGB 13.3 03/09/2022   HCT 39.0 03/09/2022   MCV 85.1 03/09/2022   PLT 229 03/09/2022      Component Value Date/Time   NA 129 (L) 03/09/2022 1656   NA 139 01/25/2021 1701   NA 136 11/23/2017 1147   NA 144 03/22/2017 0943   K 5.0 03/09/2022 1656   K 3.9 11/23/2017 1147   K 4.2 03/22/2017 0943   CL 95 (L) 03/09/2022 1645   CL 94 (L) 11/23/2017 1147   CO2 24 03/09/2022 1645   CO2 30 11/23/2017 1147   CO2 29 03/22/2017 0943   GLUCOSE 722 (HH) 03/09/2022 1645   GLUCOSE 531 (H) 11/23/2017 1147   BUN 25 (H) 03/09/2022 1645   BUN 14 01/25/2021 1701  BUN 10 11/23/2017 1147   BUN 11.1 03/22/2017 0943   CREATININE 1.22 (H) 03/09/2022 1645   CREATININE 1.32 (H) 03/09/2022 1435   CREATININE 1.0 11/23/2017 1147   CREATININE 1.0 03/22/2017 0943   CALCIUM 9.3 03/09/2022 1645   CALCIUM 10.1 11/23/2017 1147   CALCIUM 9.9 03/22/2017 0943   PROT 7.6 03/09/2022 1645   PROT 6.8 01/25/2021 1701   PROT 8.3 (H) 11/23/2017 1147   PROT 7.1 03/22/2017 0943   ALBUMIN 3.9 03/09/2022 1645   ALBUMIN 4.6 01/25/2021 1701   ALBUMIN 3.8 03/22/2017 0943   AST 35 03/09/2022 1645   AST 33 03/09/2022 1435   AST 18 03/22/2017 0943   ALT 41 03/09/2022 1645   ALT 36 03/09/2022 1435   ALT 46 11/23/2017 1147   ALT 23 03/22/2017 0943   ALKPHOS 129 (H) 03/09/2022 1645   ALKPHOS 174 (H) 11/23/2017 1147   ALKPHOS 114 03/22/2017 0943   BILITOT 0.4 03/09/2022 1645   BILITOT 0.3 03/09/2022 1435   BILITOT 0.24 03/22/2017 0943   GFRNONAA 52 (L) 03/09/2022 1645   GFRNONAA 47 (L) 03/09/2022 1435   GFRAA 59 (L) 01/25/2021 1701   GFRAA >60 07/27/2020 1316   Lab Results  Component Value Date   CHOL 172 06/03/2020   HDL 75 06/03/2020   LDLCALC 83 06/03/2020   TRIG 76 06/03/2020   CHOLHDL 2.3 06/03/2020   Lab Results  Component Value Date   HGBA1C 12.2 (A) 07/07/2022   Lab Results  Component Value Date   VITAMINB12 206 03/09/2022   Lab Results  Component Value Date   TSH 0.555  09/09/2021       ASSESSMENT AND PLAN  56 y.o. year old female here with :   Dx:  1. Stiff person syndrome     PLAN:  STIFF PERSON SYNDROME (dx'd ~2013) - recheck anti-GAD ab levels (to confirm dx) - continue diazepam 20mg  three times a day  - continue baclofen 20mg  three times a day  - continue prozac 10mg  daily - continue gabapentin 300mg  twice a day    Orders Placed This Encounter  Procedures   Glutamic acid decarboxylase auto abs   Return in about 1 year (around 07/11/2023) for with NP ).  I spent 25 minutes of face-to-face and non-face-to-face time with patient.  This included previsit chart review, lab review, study review, order entry, electronic health record documentation, patient education.      , MD 07/10/2022, 4:14 PM Certified in Neurology, Neurophysiology and Neuroimaging  [facility] [address], [address] [place], 07/12/2022 [facility] [phone]

## 2022-07-12 ENCOUNTER — Telehealth: Payer: Medicare Other

## 2022-07-12 ENCOUNTER — Telehealth: Payer: Self-pay

## 2022-07-12 DIAGNOSIS — E1069 Type 1 diabetes mellitus with other specified complication: Secondary | ICD-10-CM

## 2022-07-12 NOTE — Telephone Encounter (Signed)
  Care Management   Follow Up Note   07/12/2022 Name: Sherri [name] MRN: [id number] DOB: [birth year]   Referred by: Arnette Felts, FNP Reason for referral : Care Coordination (Inbound call from patient )  Received voice message from patient stating she needs help with obtaining resources for food. Placed successful outbound call to patient regarding her food insecurities. Determined patient is low on food and is requesting assistance. Advised patient this RN CM will send a high priority referral to the Care Guide Resource team to request they provide patient with resources to help her obtain food.   Reviewed upcoming PCP appointment with Arnette Felts FNP, scheduled for 08/07/22 @2 :40 PM. Discussed patient is aware she will be discharged from [facility] following this appointment and will need to re-establish with a new PCP due to having missed multiple appointments with her PCP provider. The patient was referred to the case management team for assistance with care management and care coordination. Discussed patient has been disenrolled from the CCM team due to not being able to keep patient engaged with the CCM team and patient is set to be discharged from the practice. Patient verbalizes understanding.   Follow Up Plan: Next PCP appointment scheduled for:  08/07/22 @2 :40 PM   08/09/22, RN, BSN, CCM Care Management Coordinator [facility]  Direct Phone: [phone]

## 2022-07-12 NOTE — Addendum Note (Signed)
Addended by: [name] on: 07/12/2022 05:03 PM   Modules accepted: Orders

## 2022-07-13 ENCOUNTER — Telehealth: Payer: Self-pay

## 2022-07-13 ENCOUNTER — Telehealth: Payer: Medicare Other
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
## 2022-07-13 NOTE — Telephone Encounter (Signed)
   Telephone encounter was:  Unsuccessful.  07/13/2022 Name: Sherri [name] MRN: [id number] DOB: [birth year]  Unsuccessful outbound call made today to assist with:  Food Insecurity  Outreach Attempt:  1st Attempt  A HIPAA compliant voice message was left requesting a return call.  Instructed patient to call back at [phone] at their earliest convenience.  [facility] Guide, Embedded Care Coordination [facility]  [place], [place] [address]  Main Phone: [phone]  E-mail: [email or web address]  Website: [email or web address]

## 2022-07-14 ENCOUNTER — Telehealth: Payer: Self-pay

## 2022-07-14 ENCOUNTER — Other Ambulatory Visit: Payer: Self-pay | Admitting: Nurse Practitioner

## 2022-07-14 NOTE — Telephone Encounter (Addendum)
   Telephone encounter was:  Successful.  07/14/2022 Name: Geeta Dworkin MRN: [id number] DOB: [birth year]  Rebeka Kimble is a 56 y.o. year old female who is a primary care patient of Arnette Felts, FNP . The community resource team was consulted for assistance with Food Insecurity  Care guide performed the following interventions: Discussed resources to assist with food . Patient wanted to see if more resources were opened at this time. I provided [employer] and [employer]. CG also sent over a referral to [employer].  Follow Up Plan:  Care guide will follow up with patient by phone over the next few days after speaking with SW.  [facility] Guide, Embedded Care Coordination [facility]  [place], [place] [address]  Main Phone: [phone]  E-mail: [email or web address]  Website: [email or web address]

## 2022-07-17 ENCOUNTER — Other Ambulatory Visit: Payer: Self-pay | Admitting: Diagnostic Neuroimaging

## 2022-07-17 ENCOUNTER — Telehealth: Payer: Self-pay

## 2022-07-17 MED ORDER — DIAZEPAM 10 MG PO TABS: ORAL_TABLET | ORAL | 0 refills | Status: DC

## 2022-07-17 NOTE — Telephone Encounter (Signed)
   Mail encounter was:  Successful.  07/17/2022 Name: [name] MRN: [id number] DOB: [birth year]  Sherri [name] is a 56 y.o. year old female who is a primary care patient of Arnette Felts, FNP . The community resource team was consulted for assistance with Food Insecurity  Care guide performed the following interventions:  Sending resources by mail per patients request.    Follow Up Plan:  Care guide will follow up with patient by phone over the next day  [facility] Guide, Embedded Care Coordination [facility]  [place], [place] [address]  Main Phone: [phone]  E-mail: [email or web address]  Website: [email or web address]

## 2022-07-17 NOTE — Telephone Encounter (Signed)
Pt is calling and need a prescription for diazepam (VALIUM) 10 MG tablet. Pt is requesting it be sent to  [id number] [place], [place] - [facility] because she is out of medication

## 2022-07-17 NOTE — Chronic Care Management (AMB) (Signed)
    Patient's 07-19-2022 appointment was canceled due to patient seeing PCP the same day.   Huey Romans [facility] Clinical Pharmacist Assistant [phone]

## 2022-07-18 ENCOUNTER — Telehealth: Payer: Self-pay

## 2022-07-18 ENCOUNTER — Other Ambulatory Visit: Payer: Self-pay | Admitting: Internal Medicine

## 2022-07-18 DIAGNOSIS — E119 Type 2 diabetes mellitus without complications: Secondary | ICD-10-CM

## 2022-07-18 NOTE — Telephone Encounter (Signed)
   Telephone encounter was:  Successful.  07/18/2022 Name: Sherri [name] MRN: [id number] DOB: [birth year]  Sherri [name] is a 56 y.o. year old female who is a primary care patient of Arnette Felts, FNP . The community resource team was consulted for assistance with Food Insecurity  Care guide performed the following interventions: Follow up call placed to the patient to discuss status of referral. CG advised pt mail has been sent per request. Mail includes [employer], Food Pantries in [facility] and [employer]. CG was also signed up with [employer] and that should start Thursday. Patient is aware of this. Pt has been advised: I have mailed the following information and if she has not received the information in 7 to 14 days or have additional questions to please call me back at [phone]. Patient understood. At this time, patient does not have any further questions or concerns.   Follow Up Plan:  No further follow up planned at this time. The patient has been provided with needed resources.  [facility] Guide, Embedded Care Coordination [facility]  [place], [place] [address]  Main Phone: [phone]  E-mail: [name]  Website: [email or web address]

## 2022-07-19 ENCOUNTER — Ambulatory Visit (INDEPENDENT_AMBULATORY_CARE_PROVIDER_SITE_OTHER): Payer: Medicare Other | Admitting: Nurse Practitioner

## 2022-07-19 ENCOUNTER — Encounter: Payer: Self-pay | Admitting: Nurse Practitioner

## 2022-07-19 ENCOUNTER — Telehealth: Payer: Medicare Other

## 2022-07-19 VITALS — BP 118/60 | HR 100 | Temp 98.2°F | Ht 63.0 in | Wt 107.2 lb

## 2022-07-19 DIAGNOSIS — E162 Hypoglycemia, unspecified: Secondary | ICD-10-CM | POA: Diagnosis not present

## 2022-07-19 DIAGNOSIS — L03818 Cellulitis of other sites: Secondary | ICD-10-CM | POA: Diagnosis not present

## 2022-07-19 DIAGNOSIS — E440 Moderate protein-calorie malnutrition: Secondary | ICD-10-CM

## 2022-07-19 DIAGNOSIS — N1831 Chronic kidney disease, stage 3a: Secondary | ICD-10-CM

## 2022-07-19 DIAGNOSIS — E1021 Type 1 diabetes mellitus with diabetic nephropathy: Secondary | ICD-10-CM | POA: Diagnosis not present

## 2022-07-19 DIAGNOSIS — E119 Type 2 diabetes mellitus without complications: Secondary | ICD-10-CM

## 2022-07-19 MED ORDER — DOXYCYCLINE HYCLATE 100 MG PO TABS: 100.0000 mg | ORAL_TABLET | Freq: Two times a day (BID) | ORAL | 0 refills | Status: DC

## 2022-07-19 MED ORDER — GVOKE HYPOPEN 2-PACK 0.5 MG/0.1ML ~~LOC~~ SOAJ: 0.1000 mL | SUBCUTANEOUS | 5 refills | Status: DC | PRN

## 2022-07-19 NOTE — Patient Instructions (Signed)

## 2022-07-19 NOTE — Progress Notes (Signed)
Barnet Glasgow Martin,acting as a Education administrator for Minette Brine, FNP.,have documented all relevant documentation on the behalf of Minette Brine, FNP,as directed by  Minette Brine, FNP while in the presence of Minette Brine, [place].    Subjective:     Patient ID: Sherri [name] , female    DOB: 31-Aug-1966 , 56 y.o.   MRN: [id number]   Chief Complaint  Patient presents with   Diabetes    HPI  Patient is here for a dm check, Patient states she went to the dentist on Monday and they told her to see her pcp because she her blood sugar was low at 69 that morning, she ate  but by the time she got to the dentist it was 305. Patient states her blood sugar has been going up and down. This morning her blood sugar was 202 (before eating oatmeal), she took 8 units novolog according to her sliding scale. She had a HgbA1c done 7/14 was 12.2 with Dr Charlett Lango.  she reports her lower lip continues to swell. She had been having multiple falls and bit her lip was not treated with any antibiotics, now has swelling to her right lower lip  BP Readings from Last 3 Encounters: 07/19/22 : 118/60 07/10/22 : 94/65 07/07/22 : 122/74    Diabetes She presents for her follow-up diabetic visit. She has type 2 diabetes mellitus. There are no hypoglycemic associated symptoms. There are no diabetic associated symptoms. There are no diabetic complications. Risk factors for coronary artery disease include diabetes mellitus. Current diabetic treatment includes oral agent (triple therapy). She is compliant with treatment some of the time. (Blood sugars have ranged 69-200's)     Past Medical History:  Diagnosis Date   Angioedema    Anxiety    Arthritis    Asthma    Benzodiazepine withdrawal ([place]) 08/30/2012   CAP (community acquired pneumonia) 08/29/2012   Chronic back pain    Chronic kidney disease    Closed nondisplaced fracture of proximal phalanx of right little finger 10/18/2018   Cocaine abuse ([place])    Depression    Diarrhea     DVT (deep venous thrombosis) (HCC)    Environmental allergies    Fall    GERD (gastroesophageal reflux disease)    Heart murmur    has been told once that she has a heart murmur, but has never had any problems   Hiatal hernia 03/21/2013   Lumbar herniated disc    Peripheral vascular disease (HCC)    Pernicious anemia 10/30/2011   Postconcussion syndrome 12/30/2014   Rhabdomyolysis 08/27/2012   Seasonal allergies    Stiff person syndrome    Urticaria      Family History  Problem Relation Age of Onset   Cancer Mother    Other Mother    COPD Father    Asthma Brother    Cancer Brother        colon   Heart attack Brother    Seizures Brother    Allergic rhinitis Neg Hx    Angioedema Neg Hx    Eczema Neg Hx    Immunodeficiency Neg Hx    Urticaria Neg Hx      Current Outpatient Medications:    ACCU-CHEK SMARTVIEW test strip, USE AS INSTRUCTED TO CHECK SUGARS BEFORE MEALS AND AT BEDTIME, Disp: 100 strip, Rfl: 12   albuterol (VENTOLIN HFA) 108 (90 Base) MCG/ACT inhaler, TAKE 2 PUFFS BY MOUTH EVERY 6 HOURS AS NEEDED FOR WHEEZE OR SHORTNESS OF BREATH (Patient taking  differently: Inhale 2 puffs into the lungs every 6 (six) hours as needed for wheezing or shortness of breath.), Disp: 8.5 each, Rfl: 1   Ascorbic Acid (VITAMIN C) 1000 MG tablet, TAKE 1 TABLET BY MOUTH EVERY DAY, Disp: 30 tablet, Rfl: 1   baclofen (LIORESAL) 20 MG tablet, TAKE 1 TABLET BY MOUTH 3  TIMES DAILY, Disp: 90 tablet, Rfl: 0   cetirizine (ZYRTEC ALLERGY) 10 MG tablet, Take 1 tablet (10 mg total) by mouth daily., Disp: 90 tablet, Rfl: 2   Cholecalciferol (VITAMIN D3) 10 MCG (400 UNIT) tablet, Take 400 Units by mouth daily., Disp: , Rfl:    Cranberry-Vitamin C-Vitamin E 4200-20-3 MG-MG-UNIT CAPS, Take 1 tablet by mouth daily., Disp: , Rfl:    desloratadine (CLARINEX) 5 MG tablet, TAKE 1 TABLET BY MOUTH DAILY, Disp: 30 tablet, Rfl: 1   diazepam (VALIUM) 10 MG tablet, TAKE 2 TABLETS BY MOUTH IN THE  MORNING, AT NOON AND AT  BEDTIME, Disp: 180 tablet, Rfl: 0   doxycycline (VIBRA-TABS) 100 MG tablet, Take 1 tablet (100 mg total) by mouth 2 (two) times daily., Disp: 20 tablet, Rfl: 0   EPINEPHRINE 0.3 mg/0.3 mL IJ SOAJ injection, INJECT INTRAMUSCULARLY 1 PEN AS  NEEDED FOR ALLERGIC RESPONSE AS  DIRECTED BY MD. SEEK MEDICAL  HELP AFTER USE., Disp: 2 each, Rfl: 0   famotidine (PEPCID) 20 MG tablet, TAKE 1 TABLET BY MOUTH  TWICE DAILY, Disp: 180 tablet, Rfl: 1   FLUoxetine (PROZAC) 10 MG capsule, TAKE 1 CAPSULE BY MOUTH DAILY, Disp: 90 capsule, Rfl: 3   fluticasone (FLONASE) 50 MCG/ACT nasal spray, Place 2 sprays into both nostrils daily., Disp: 16 g, Rfl: 0   gabapentin (NEURONTIN) 300 MG capsule, TAKE 1 CAPSULE BY MOUTH TWICE  DAILY, Disp: 180 capsule, Rfl: 3   hydrOXYzine (VISTARIL) 100 MG capsule, TAKE 1 CAPSULE BY MOUTH 3  TIMES DAILY AS NEEDED FOR  ITCHING (Patient taking differently: Take 100 mg by mouth 3 (three) times daily as needed for itching.), Disp: 90 capsule, Rfl: 1   insulin aspart (NOVOLOG FLEXPEN) 100 UNIT/ML FlexPen, Max daily 30 units, Disp: 15 mL, Rfl: 11   insulin degludec (TRESIBA) 100 UNIT/ML FlexTouch Pen, Inject 10 Units into the skin daily., Disp: 15 mL, Rfl: 3   Insulin Pen Needle 32G X 4 MM MISC, 1 each by Does not apply route 2 (two) times daily. Dx E11.9, Disp: 100 each, Rfl: 0   vitamin E 180 MG (400 UNITS) capsule, Take 400 Units by mouth daily. , Disp: , Rfl:    Glucagon (GVOKE HYPOPEN 2-PACK) 0.5 MG/0.1ML SOAJ, Inject 0.1 mLs into the skin as needed (if blood sugar less than 70)., Disp: 0.2 mL, Rfl: 5   Allergies  Allergen Reactions   Ibuprofen Other (See Comments)    Does not take due to hx of renal insufficiency "I have kidney disease"    Lemon Flavor Swelling    Severe Lip Swelling  FRUIT per pt.     Amoxicillin Diarrhea and Other (See Comments)   Tylenol [Acetaminophen] Hives    Cannot take large quantities     Review of Systems  Constitutional: Negative.   HENT: Negative.     Eyes: Negative.   Respiratory: Negative.    Cardiovascular: Negative.   Gastrointestinal: Negative.   Endocrine: Negative.   Genitourinary: Negative.   Musculoskeletal: Negative.   Skin: Negative.   Allergic/Immunologic: Negative.   Neurological: Negative.   Hematological: Negative.   Psychiatric/Behavioral: Negative.  Today's Vitals   07/19/22 1001  BP: 118/60  Pulse: 100  Temp: 98.2 F (36.8 C)  TempSrc: Oral  Weight: 107 lb 3.2 oz (48.6 kg)  Height: _0  (1.6 m)  PainSc: 0-No pain   Body mass index is 18.99 kg/m.  Wt Readings from Last 3 Encounters:  07/19/22 107 lb 3.2 oz (48.6 kg)  07/07/22 109 lb (49.4 kg)  03/09/22 114 lb 6.7 oz (51.9 kg)    Objective:  Physical Exam Vitals reviewed.  Constitutional:      General: She is not in acute distress.    Appearance: Normal appearance.     Comments: Thin and frail appearance  Cardiovascular:     Rate and Rhythm: Normal rate and regular rhythm.     Pulses: Normal pulses.     Heart sounds: Normal heart sounds. No murmur heard. Pulmonary:     Effort: Pulmonary effort is normal. No respiratory distress.     Breath sounds: Normal breath sounds. No wheezing.  Musculoskeletal:        General: Normal range of motion.  Skin:    General: Skin is warm and dry.     Comments: Right lower right side of lip with swelling and slightly firm.   Neurological:     General: No focal deficit present.     Mental Status: She is alert and oriented to person, place, and time.     Cranial Nerves: No cranial nerve deficit.     Motor: No weakness.  Psychiatric:        Mood and Affect: Mood normal.        Behavior: Behavior normal.        Thought Content: Thought content normal.        Judgment: Judgment normal.         Assessment And Plan:     1. 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  Dispense: 0.2 mL; Refill: 5  2. Stage 3a chronic kidney disease ([place]) - Renal function panel with eGFR  3. Malnutrition of moderate degree Comments: Samples of Ensure given in office, encouraged to eat balanced meals. - Renal function panel with eGFR  4. Hypoglycemia Comments: She has had low blood sugars, I have sent a Rx for Gvoke in the event her blood sugar is less than 70.  - Glucagon (GVOKE HYPOPEN 2-PACK) 0.5 MG/0.1ML SOAJ; Inject 0.1 mLs into the skin as needed (if blood sugar less than 70).  Dispense: 0.2 mL; Refill: 5  5. Cellulitis of other specified site Comments: Right lower right side of lip with swelling and slightly firm. Concerning for cellulitis since this is still swollen after several months - doxycycline (VIBRA-TABS) 100 MG tablet; Take 1 tablet (100 mg total) by mouth 2 (two) times daily.  Dispense: 20 tablet; Refill: 0   Discussed discharging from the practice due to multiple no shows/cancellations on same day as well. She reports she has a new provider in [place] on [address], we can fax her records once she provides the practice or provider name.   Patient was given opportunity to ask questions. Patient verbalized understanding of the plan and was able to repeat key elements of the plan. All questions were answered to their satisfaction.  Minette Brine, FNP   I, Minette Brine, FNP, have  reviewed all documentation for this visit. The documentation on 07/19/22 for the exam, diagnosis, procedures, and orders are all accurate and complete.   IF YOU HAVE BEEN REFERRED TO A SPECIALIST, IT MAY TAKE 1-2 WEEKS TO SCHEDULE/PROCESS THE REFERRAL. IF YOU HAVE NOT HEARD FROM US/SPECIALIST IN TWO WEEKS, PLEASE GIVE [place] A CALL AT [phone].   THE PATIENT IS ENCOURAGED TO PRACTICE SOCIAL DISTANCING DUE TO THE COVID-19 PANDEMIC.

## 2022-07-20 ENCOUNTER — Other Ambulatory Visit: Payer: Self-pay | Admitting: Nurse Practitioner

## 2022-07-20 LAB — RENAL FUNCTION PANEL
Albumin: 4.4 g/dL (ref 3.8–4.9)
BUN/Creatinine Ratio: 23 (ref 9–23)
BUN: 28 mg/dL — ABNORMAL HIGH (ref 6–24)
CO2: 21 mmol/L (ref 20–29)
Calcium: 9.9 mg/dL (ref 8.7–10.2)
Chloride: 105 mmol/L (ref 96–106)
Creatinine, Ser: 1.22 mg/dL — ABNORMAL HIGH (ref 0.57–1.00)
Glucose: 187 mg/dL — ABNORMAL HIGH (ref 70–99)
Phosphorus: 2.6 mg/dL — ABNORMAL LOW (ref 3.0–4.3)
Potassium: 4 mmol/L (ref 3.5–5.2)
Sodium: 143 mmol/L (ref 134–144)
eGFR: 52 mL/min/{1.73_m2} — ABNORMAL LOW (ref >59)

## 2022-07-20 LAB — MICROALBUMIN / CREATININE URINE RATIO
Creatinine, Urine: 161.4 mg/dL
Microalb/Creat Ratio: 12 mg/g creat (ref 0–29)
Microalbumin, Urine: 19.9 ug/mL

## 2022-07-21 ENCOUNTER — Encounter: Payer: Self-pay | Admitting: Nurse Practitioner

## 2022-07-24 ENCOUNTER — Telehealth: Payer: Medicare Other

## 2022-07-31 ENCOUNTER — Telehealth: Payer: Self-pay

## 2022-07-31 NOTE — Progress Notes (Signed)
    Chronic Care Management Pharmacy Assistant   Name: Sherri [name]  MRN: [id number] DOB: 1966/10/22  Reason for Encounter: CCM (Schedule Appointment)  Called patient to schedule a telephone appointment with Al Corpus. Patient has been scheduled for 08/23/22 at 1:30.   Al Corpus, CPP notified  Claudina Lick, [place] Clinical Pharmacy Assistant [phone]

## 2022-07-31 NOTE — Telephone Encounter (Signed)
Patient picked up Tresiba sample 

## 2022-07-31 NOTE — Telephone Encounter (Signed)
Patient will stop by and pick up a sample of [place]. Medication has being labeled in fridge.

## 2022-08-04 ENCOUNTER — Other Ambulatory Visit: Payer: Self-pay | Admitting: Neurology

## 2022-08-07 ENCOUNTER — Ambulatory Visit: Payer: Medicare Other | Admitting: Nurse Practitioner

## 2022-08-17 ENCOUNTER — Ambulatory Visit: Payer: Medicare Other

## 2022-08-17 ENCOUNTER — Ambulatory Visit
Admission: RE | Admit: 2022-08-17 | Discharge: 2022-08-17 | Disposition: A | Discharge status: Home / Self Care | Payer: Medicare Other | Source: Ambulatory Visit | Attending: Nurse Practitioner | Admitting: Nurse Practitioner

## 2022-08-17 ENCOUNTER — Encounter: Payer: Self-pay | Admitting: Nurse Practitioner

## 2022-08-17 ENCOUNTER — Ambulatory Visit: Payer: Medicare Other | Admitting: Nurse Practitioner

## 2022-08-17 ENCOUNTER — Ambulatory Visit (INDEPENDENT_AMBULATORY_CARE_PROVIDER_SITE_OTHER): Payer: Medicare Other | Admitting: Nurse Practitioner

## 2022-08-17 VITALS — BP 104/72 | HR 83 | Temp 96.3°F | Resp 12 | Ht 63.0 in | Wt 109.4 lb

## 2022-08-17 DIAGNOSIS — R4781 Slurred speech: Secondary | ICD-10-CM

## 2022-08-17 DIAGNOSIS — J4520 Mild intermittent asthma, uncomplicated: Secondary | ICD-10-CM

## 2022-08-17 DIAGNOSIS — R4020 Unspecified coma: Secondary | ICD-10-CM | POA: Diagnosis not present

## 2022-08-17 DIAGNOSIS — W19XXXD Unspecified fall, subsequent encounter: Secondary | ICD-10-CM | POA: Insufficient documentation

## 2022-08-17 DIAGNOSIS — W19XXXA Unspecified fall, initial encounter: Secondary | ICD-10-CM

## 2022-08-17 DIAGNOSIS — D649 Anemia, unspecified: Secondary | ICD-10-CM

## 2022-08-17 DIAGNOSIS — R55 Syncope and collapse: Secondary | ICD-10-CM | POA: Diagnosis not present

## 2022-08-17 DIAGNOSIS — F1410 Cocaine abuse, uncomplicated: Secondary | ICD-10-CM

## 2022-08-17 DIAGNOSIS — L910 Hypertrophic scar: Secondary | ICD-10-CM

## 2022-08-17 DIAGNOSIS — E1065 Type 1 diabetes mellitus with hyperglycemia: Secondary | ICD-10-CM

## 2022-08-17 DIAGNOSIS — G2582 Stiff-man syndrome: Secondary | ICD-10-CM

## 2022-08-17 LAB — COMPREHENSIVE METABOLIC PANEL
ALT: 82 U/L — ABNORMAL HIGH (ref 0–35)
AST: 62 U/L — ABNORMAL HIGH (ref 0–37)
Albumin: 4 g/dL (ref 3.5–5.2)
Alkaline Phosphatase: 117 U/L (ref 39–117)
BUN: 17 mg/dL (ref 6–23)
CO2: 28 mEq/L (ref 19–32)
Calcium: 10 mg/dL (ref 8.4–10.5)
Chloride: 102 mEq/L (ref 96–112)
Creatinine, Ser: 1.11 mg/dL (ref 0.40–1.20)
GFR: 55 mL/min — ABNORMAL LOW (ref >60.00)
Glucose, Bld: 86 mg/dL (ref 70–99)
Potassium: 4.1 mEq/L (ref 3.5–5.1)
Sodium: 141 mEq/L (ref 135–145)
Total Bilirubin: 0.2 mg/dL (ref 0.2–1.2)
Total Protein: 6.9 g/dL (ref 6.0–8.3)

## 2022-08-17 LAB — CBC
HCT: 36.5 % (ref 36.0–46.0)
Hemoglobin: 12.2 g/dL (ref 12.0–15.0)
MCHC: 33.3 g/dL (ref 30.0–36.0)
MCV: 86.9 fl (ref 78.0–100.0)
Platelets: 223 10*3/uL (ref 150.0–400.0)
RBC: 4.2 Mil/uL (ref 3.87–5.11)
RDW: 13 % (ref 11.5–15.5)
WBC: 5.9 10*3/uL (ref 4.0–10.5)

## 2022-08-17 LAB — IBC + FERRITIN
Ferritin: 483.1 ng/mL — ABNORMAL HIGH (ref 10.0–291.0)
Iron: 86 ug/dL (ref 42–145)
Saturation Ratios: 29.3 % (ref 20.0–50.0)
TIBC: 294 ug/dL (ref 250.0–450.0)
Transferrin: 210 mg/dL — ABNORMAL LOW (ref 212.0–360.0)

## 2022-08-17 LAB — VITAMIN B12: Vitamin B-12: 237 pg/mL (ref 211–911)

## 2022-08-17 LAB — TSH: TSH: 0.91 u[IU]/mL (ref 0.35–5.50)

## 2022-08-17 LAB — FOLATE: Folate: 10.5 ng/mL (ref >5.9)

## 2022-08-17 NOTE — Patient Instructions (Signed)
Nice to see you today I will be in touch with the labs and CT results once I have them Follow up with me in approx 3 months, sooner if you need me

## 2022-08-17 NOTE — Assessment & Plan Note (Signed)
Historical.  Patient no longer using illicit substances per her report

## 2022-08-17 NOTE — Assessment & Plan Note (Signed)
Patient is followed by [facility].  They do manage her diazepam, baclofen, gabapentin.  Continue following with neurology as recommended to continue taking medication as prescribed.

## 2022-08-17 NOTE — Assessment & Plan Note (Signed)
Patient had LOC see when she got out of bed which resulted in her falling hitting her head.  This could be secondary to hypoglycemia.  Obtaining stat CT scan of head today

## 2022-08-17 NOTE — Assessment & Plan Note (Signed)
Patient currently being followed by endocrinology.  Patient does use long-acting along with short acting insulins.  Does have glucometer at home and checks glucose 3-4 times a day.  Continue to follow endocrinology and taking medications as prescribed

## 2022-08-17 NOTE — Assessment & Plan Note (Signed)
Has had 2 episodes of slurred speech.  Once when she was talk with a Child psychotherapist that called 9 1 and had her evaluated.  Second time most recent talk to a friend on the phone did not get evaluated.  Curious if patient is having TIA and she does have risk factors for stroke.  Pending stat CT scan of head

## 2022-08-17 NOTE — Assessment & Plan Note (Signed)
We will check CBC along with anemia labs, pending result

## 2022-08-17 NOTE — Assessment & Plan Note (Signed)
To right upper auricle of the ear.  Patient would like to have this evaluated and removed if possible.  Will place ambulatory referral to dermatology

## 2022-08-17 NOTE — Assessment & Plan Note (Signed)
Patient currently uses the albuterol inhaler twice daily.  States this is just a recent occurrence she is on several antihistamines to help.  Consider adding on maintenance inhaler or montelukast you ask if needed in the future.

## 2022-08-17 NOTE — Progress Notes (Signed)
New Patient Office Visit  Subjective    Patient ID: Sherri [name], female    DOB: [birth year]  Age: 56 y.o. MRN: [id number]  CC:  Chief Complaint  Patient presents with   Establish Care    Previous PCP Penelope Galas and has endocrinologist for DM   Fall    On 08/11/22-patient stood up to go to the room to check her sugar and she felt shakey and wobbly and ended up blacking out and falling backwards and hit the back of her head. Blacked out for a few seconds.    Aphasia    On 08/15/22-had a few minutes of having slurred speech and not talking properly while talk to a friend on the phone.     HPI Sherri [name] presents to establish care  DM1: States she sees Dr. Antonieta Iba.  Just injections does not have a insulin a pump. Checks glucose 3-4 times daily.  She has had conversation with endocrinology about CGM  Asthma: States that when she started working at the [employer] is when it started. States 1994. States that she albuterol inhelr as needed.  She is using it 2 times a day. States sometimes once a day with one puff.  Fall: States that she went to go and check her sugar last Friday. States that she was feeling funny. When her sugar drops she feels sweaty. States she stood up and she felt off. States that she passed out. Hit her head on the night stand. States that her friend was there shaking her and trying to pull her up. States that she did check her sugar and it was 76  Neurology/ Stiff person syndrome: diazepam, gabapentin, bacolfen through [facility].  Allergies: takes hydroxyzine once a day. The clarinex and zyrtec daily and flonase  Slurred speech: Has happened twice. The first time was evaluated in the ed. The last time her friend could not understand her.  States she had just woken up and sometimes is little groggy from medications.  States that they just hung up and she went back to sleep.  Outpatient Encounter Medications as of 08/17/2022  Medication Sig    ACCU-CHEK SMARTVIEW test strip USE AS INSTRUCTED TO CHECK SUGARS BEFORE MEALS AND AT BEDTIME   Albuterol Sulfate (PROAIR RESPICLICK) 108 (90 Base) MCG/ACT AEPB Inhale into the lungs.   Ascorbic Acid (VITAMIN C) 1000 MG tablet TAKE 1 TABLET BY MOUTH EVERY DAY   baclofen (LIORESAL) 20 MG tablet TAKE 1 TABLET BY MOUTH 3  TIMES DAILY   cetirizine (ZYRTEC ALLERGY) 10 MG tablet Take 1 tablet (10 mg total) by mouth daily.   Cholecalciferol (VITAMIN D3) 10 MCG (400 UNIT) tablet Take 400 Units by mouth daily.   Cranberry-Vitamin C-Vitamin E 4200-20-3 MG-MG-UNIT CAPS Take 1 tablet by mouth daily.   desloratadine (CLARINEX) 5 MG tablet TAKE 1 TABLET BY MOUTH DAILY   diazepam (VALIUM) 10 MG tablet TAKE 2 TABLETS BY MOUTH IN THE  MORNING, AT NOON, AND AT BEDTIME   EPINEPHRINE 0.3 mg/0.3 mL IJ SOAJ injection INJECT INTRAMUSCULARLY 1 PEN AS  NEEDED FOR ALLERGIC RESPONSE AS  DIRECTED BY MD. SEEK MEDICAL  HELP AFTER USE.   famotidine (PEPCID) 20 MG tablet TAKE 1 TABLET BY MOUTH  TWICE DAILY   FLUoxetine (PROZAC) 10 MG capsule TAKE 1 CAPSULE BY MOUTH DAILY   fluticasone (FLONASE) 50 MCG/ACT nasal spray Place 2 sprays into both nostrils daily.   gabapentin (NEURONTIN) 300 MG capsule TAKE 1 CAPSULE BY MOUTH TWICE  DAILY  hydrOXYzine (VISTARIL) 100 MG capsule TAKE 1 CAPSULE BY MOUTH 3  TIMES DAILY AS NEEDED FOR  ITCHING (Patient taking differently: Take 100 mg by mouth 3 (three) times daily as needed for itching.)   insulin aspart (NOVOLOG FLEXPEN) 100 UNIT/ML FlexPen Max daily 30 units   insulin degludec (TRESIBA) 100 UNIT/ML FlexTouch Pen Inject 10 Units into the skin daily.   Insulin Pen Needle 32G X 4 MM MISC 1 each by Does not apply route 2 (two) times daily. Dx E11.9   vitamin E 180 MG (400 UNITS) capsule Take 400 Units by mouth daily.    Glucagon (GVOKE HYPOPEN 2-PACK) 0.5 MG/0.1ML SOAJ Inject 0.1 mLs into the skin as needed (if blood sugar less than 70). (Patient not taking: Reported on 08/17/2022)    [DISCONTINUED] albuterol (VENTOLIN HFA) 108 (90 Base) MCG/ACT inhaler TAKE 2 PUFFS BY MOUTH EVERY 6 HOURS AS NEEDED FOR WHEEZE OR SHORTNESS OF BREATH (Patient taking differently: Inhale 2 puffs into the lungs every 6 (six) hours as needed for wheezing or shortness of breath.)   [DISCONTINUED] doxycycline (VIBRA-TABS) 100 MG tablet Take 1 tablet (100 mg total) by mouth 2 (two) times daily.   No facility-administered encounter medications on file as of 08/17/2022.    Past Medical History:  Diagnosis Date   Angioedema    Anxiety    Arthritis    Asthma    Benzodiazepine withdrawal (HCC) 08/30/2012   CAP (community acquired pneumonia) 08/29/2012   Chronic back pain    Chronic kidney disease    Closed nondisplaced fracture of proximal phalanx of right little finger 10/18/2018   Cocaine abuse (HCC)    Depression    Diarrhea    DVT (deep venous thrombosis) (HCC)    Environmental allergies    Fall    GERD (gastroesophageal reflux disease)    Heart murmur    has been told once that she has a heart murmur, but has never had any problems   Hiatal hernia 03/21/2013   Lumbar herniated disc    Peripheral vascular disease (HCC)    Pernicious anemia 10/30/2011   Postconcussion syndrome 12/30/2014   Rhabdomyolysis 08/27/2012   Seasonal allergies    Stiff person syndrome    Urticaria     Past Surgical History:  Procedure Laterality Date   BUNIONECTOMY Left    colonoscopy  07/09/15   inguinal hernia 1983  1983   OPEN REDUCTION INTERNAL FIXATION (ORIF) DISTAL RADIAL FRACTURE Right 08/16/2016   Procedure: OPEN REDUCTION INTERNAL FIXATION (ORIF) DISTAL RADIAL FRACTURE;  Surgeon: Eldred Manges, MD;  Location: [facility] OR;  Service: Orthopedics;  Laterality: Right;    Family History  Problem Relation Age of Onset   Diabetes Mother    Cancer Mother 25       myloma   Other Mother        pernicious anemia   COPD Father    Transient ischemic attack Father    Asthma Brother    Cancer Brother 71       colon    Heart attack Brother    Seizures Brother    Allergic rhinitis Neg Hx    Angioedema Neg Hx    Eczema Neg Hx    Immunodeficiency Neg Hx    Urticaria Neg Hx     Social History   Socioeconomic History   Marital status: Divorced    Spouse name: Not on file   Number of children: 1   Years of education: college   Highest education  level: Not on file  Occupational History   Occupation: disabled  Tobacco Use   Smoking status: Every Day    Types: Cigars   Smokeless tobacco: Never   Tobacco comments:    Never Used Tobacco  Vaping Use   Vaping Use: Never used  Substance and Sexual Activity   Alcohol use: Not Currently   Drug use: Not Currently    Types: Cocaine   Sexual activity: Yes    Partners: Male  Other Topics Concern   Not on file  Social History Narrative   Patient is right and left handed.   Patient drinks some caffeine occasionally.   Social Determinants of Health   Financial Resource Strain: High Risk (07/14/2021)   Overall Financial Resource Strain (CARDIA)    Difficulty of Paying Living Expenses: Very hard  Food Insecurity: Food Insecurity Present (07/13/2022)   Hunger Vital Sign    Worried About Running Out of Food in the Last Year: Often true    Ran Out of Food in the Last Year: Sometimes true  Transportation Needs: No Transportation Needs (07/14/2021)   PRAPARE - Administrator, Civil ServiceTransportation    Lack of Transportation (Medical): No    Lack of Transportation (Non-Medical): No  Physical Activity: Inactive (07/14/2021)   Exercise Vital Sign    Days of Exercise per Week: 0 days    Minutes of Exercise per Session: 0 min  Stress: No Stress Concern Present (06/03/2020)   [employer] - Occupational Stress Questionnaire    Feeling of Stress : Not at all  Social Connections: Not on file  Intimate Partner Violence: Not At Risk (04/22/2019)   Humiliation, Afraid, Rape, and Kick questionnaire    Fear of Current or Ex-Partner: No    Emotionally Abused:  No    Physically Abused: No    Sexually Abused: No    Review of Systems  Constitutional:  Negative for chills and fever.  Respiratory:  Negative for shortness of breath.   Cardiovascular:  Negative for chest pain.  Gastrointestinal:  Negative for abdominal pain, diarrhea, nausea and vomiting.       BM daily  Neurological:  Positive for loss of consciousness. Negative for dizziness, tingling, weakness and headaches.        Objective    BP 104/72   Pulse 83   Temp (!) 96.3 F (35.7 C)   Resp 12   Ht 5\' 3"  (1.6 m)   Wt 109 lb 6 oz (49.6 kg)   LMP 07/29/2012 (Approximate)   SpO2 98%   BMI 19.37 kg/m   Physical Exam Vitals and nursing note reviewed.  Constitutional:      Appearance: Normal appearance.  HENT:     Right Ear: Tympanic membrane, ear canal and external ear normal.     Left Ear: Tympanic membrane, ear canal and external ear normal.     Mouth/Throat:     Mouth: Mucous membranes are moist.     Pharynx: Oropharynx is clear.  Eyes:     Extraocular Movements: Extraocular movements intact.     Pupils: Pupils are equal, round, and reactive to light.  Cardiovascular:     Rate and Rhythm: Normal rate and regular rhythm.     Pulses: Normal pulses.     Heart sounds: Normal heart sounds.  Pulmonary:     Effort: Pulmonary effort is normal.     Breath sounds: Normal breath sounds.  Musculoskeletal:     Right lower leg: No edema.     Left  lower leg: No edema.  Lymphadenopathy:     Cervical: No cervical adenopathy.  Skin:    General: Skin is warm.  Neurological:     General: No focal deficit present.     Mental Status: She is alert.     Deep Tendon Reflexes:     Reflex Scores:      Bicep reflexes are 2+ on the right side and 2+ on the left side.      Patellar reflexes are 2+ on the right side and 2+ on the left side.    Comments: Bilateral upper and lower extremity strength 5/5         Assessment & Plan:   Problem List Items Addressed This Visit        Respiratory   Mild intermittent asthma    Patient currently uses the albuterol inhaler twice daily.  States this is just a recent occurrence she is on several antihistamines to help.  Consider adding on maintenance inhaler or montelukast you ask if needed in the future.      Relevant Medications   Albuterol Sulfate (PROAIR RESPICLICK) 108 (90 Base) MCG/ACT AEPB     Endocrine   Type 1 diabetes mellitus with hyperglycemia [facility])    Patient currently being followed by endocrinology.  Patient does use long-acting along with short acting insulins.  Does have glucometer at home and checks glucose 3-4 times a day.  Continue to follow endocrinology and taking medications as prescribed        Nervous and Auditory   Stiff person syndrome (Chronic)    Patient is followed by [facility].  They do manage her diazepam, baclofen, gabapentin.  Continue following with neurology as recommended to continue taking medication as prescribed.        Musculoskeletal and Integument   Keloid    To right upper auricle of the ear.  Patient would like to have this evaluated and removed if possible.  Will place ambulatory referral to dermatology      Relevant Orders   Ambulatory referral to Dermatology     Other   Anemia    We will check CBC along with anemia labs, pending result      Relevant Orders   CBC   Vitamin B12   IBC + Ferritin   Folate   Cocaine abuse (HCC)    Historical.  Patient no longer using illicit substances per her report      Fall - Primary   Relevant Orders   CBC   TSH   Comprehensive metabolic panel   CT HEAD WO CONTRAST ( ) (Completed)   LOC (loss of consciousness) (HCC)    Patient had LOC see when she got out of bed which resulted in her falling hitting her head.  This could be secondary to hypoglycemia.  Obtaining stat CT scan of head today      Relevant Orders   TSH   Comprehensive metabolic panel   CT HEAD WO CONTRAST ( ) (Completed)    Slurred speech    Has had 2 episodes of slurred speech.  Once when she was talk with a Child psychotherapist that called 9 1 and had her evaluated.  Second time most recent talk to a friend on the phone did not get evaluated.  Curious if patient is having TIA and she does have risk factors for stroke.  Pending stat CT scan of head      Relevant Orders   CT HEAD WO CONTRAST ( ) (Completed)  Return in about 3 months (around 11/17/2022) for Recheck on chronic conditions.   Audria Nine, NP

## 2022-08-18 ENCOUNTER — Telehealth: Payer: Self-pay

## 2022-08-18 NOTE — Progress Notes (Signed)
    Chronic Care Management Pharmacy Assistant   Name: Sherri [name]  MRN: [id number] DOB: 08/30/1966  Reason for Encounter: CCM (Appointment Reminder)  Medications: Outpatient Encounter Medications as of 08/18/2022  Medication Sig Note   ACCU-CHEK SMARTVIEW test strip USE AS INSTRUCTED TO CHECK SUGARS BEFORE MEALS AND AT BEDTIME    Albuterol Sulfate (PROAIR RESPICLICK) 108 (90 Base) MCG/ACT AEPB Inhale into the lungs.    Ascorbic Acid (VITAMIN C) 1000 MG tablet TAKE 1 TABLET BY MOUTH EVERY DAY    baclofen (LIORESAL) 20 MG tablet TAKE 1 TABLET BY MOUTH 3  TIMES DAILY    cetirizine (ZYRTEC ALLERGY) 10 MG tablet Take 1 tablet (10 mg total) by mouth daily.    Cholecalciferol (VITAMIN D3) 10 MCG (400 UNIT) tablet Take 400 Units by mouth daily.    Cranberry-Vitamin C-Vitamin E 4200-20-3 MG-MG-UNIT CAPS Take 1 tablet by mouth daily.    desloratadine (CLARINEX) 5 MG tablet TAKE 1 TABLET BY MOUTH DAILY    diazepam (VALIUM) 10 MG tablet TAKE 2 TABLETS BY MOUTH IN THE  MORNING, AT NOON, AND AT BEDTIME    EPINEPHRINE 0.3 mg/0.3 mL IJ SOAJ injection INJECT INTRAMUSCULARLY 1 PEN AS  NEEDED FOR ALLERGIC RESPONSE AS  DIRECTED BY MD. SEEK MEDICAL  HELP AFTER USE.    famotidine (PEPCID) 20 MG tablet TAKE 1 TABLET BY MOUTH  TWICE DAILY    FLUoxetine (PROZAC) 10 MG capsule TAKE 1 CAPSULE BY MOUTH DAILY    fluticasone (FLONASE) 50 MCG/ACT nasal spray Place 2 sprays into both nostrils daily.    gabapentin (NEURONTIN) 300 MG capsule TAKE 1 CAPSULE BY MOUTH TWICE  DAILY    Glucagon (GVOKE HYPOPEN 2-PACK) 0.5 MG/0.1ML SOAJ Inject 0.1 mLs into the skin as needed (if blood sugar less than 70). (Patient not taking: Reported on 08/17/2022) 08/17/2022: Did not start on this yet   hydrOXYzine (VISTARIL) 100 MG capsule TAKE 1 CAPSULE BY MOUTH 3  TIMES DAILY AS NEEDED FOR  ITCHING (Patient taking differently: Take 100 mg by mouth 3 (three) times daily as needed for itching.)    insulin aspart (NOVOLOG FLEXPEN) 100 UNIT/ML  FlexPen Max daily 30 units    insulin degludec (TRESIBA) 100 UNIT/ML FlexTouch Pen Inject 10 Units into the skin daily.    Insulin Pen Needle 32G X 4 MM MISC 1 each by Does not apply route 2 (two) times daily. Dx E11.9    vitamin E 180 MG (400 UNITS) capsule Take 400 Units by mouth daily.     No facility-administered encounter medications on file as of 08/18/2022.   Sherri [name] was contacted to remind of upcoming telephone visit with Al Corpus on 08/23/22 at 1:30. Patient was reminded to have any blood glucose and blood pressure readings available for review at appointment.   Message was left reminding patient of appointment.  CCM referral has been placed prior to visit?  Yes   Star Rating Drugs: Medication:  Last Fill: Day Supply No star rating drugs noted  Al Corpus, CPP notified  Claudina Lick, [place] Clinical Pharmacy Assistant [phone]

## 2022-08-22 ENCOUNTER — Telehealth: Payer: Self-pay

## 2022-08-22 NOTE — Telephone Encounter (Signed)
   Telephone encounter was:  Successful.  08/22/2022 Name: Sherri [name] MRN: [id number] DOB: 1965/12/30  Sherri [name] is a 56 y.o. year old female who is a primary care patient of Eden Emms, NP . The community resource team was consulted for assistance with Food Insecurity  Care guide performed the following interventions: Patient advised she is needing further assistance with meals. CG sent [employer] Meals request.  Follow Up Plan:   N/A  [facility] Guide, Embedded Care Coordination [facility]  [place], [place] [address]  Main Phone: [phone]  E-mail: [email or web address]  Website: [email or web address]

## 2022-08-23 ENCOUNTER — Telehealth: Payer: Self-pay | Admitting: Pharmacist

## 2022-08-23 ENCOUNTER — Telehealth: Payer: Medicare Other

## 2022-08-23 NOTE — Telephone Encounter (Signed)
  Chronic Care Management   Outreach Note  08/23/2022 Name: Dawnyel Leven MRN: [id number] DOB: [birth year]  Referred by: Eden Emms, NP  Patient had a phone appointment scheduled with clinical pharmacist today.  An unsuccessful telephone outreach was attempted today. The patient was referred to the pharmacist for assistance with medications, care management and care coordination.   Patient will NOT be penalized in any way for missing a CCM appointment. The no-show fee does not apply.  If possible, a message was left to return call to: [phone] or to [facility].  Al Corpus, PharmD, BCACP Clinical Pharmacist [facility] [phone]

## 2022-08-23 NOTE — Progress Notes (Deleted)
Chronic Care Management Pharmacy Note  08/23/2022 Name:  Sherri [name] MRN:  [id number] DOB:  23-Nov-1966  Summary: CCM Initial visit ***  Recommendations/Changes made from today's visit: ***  Plan: -[facility] will call patient *** -Pharmacist follow up televisit scheduled for *** -PCP F/U 11/20/22    Subjective: Sherri [name] is an 56 y.o. year old female who is a primary patient of Sherri Pitcher, NP.  The CCM team was consulted for assistance with disease management and care coordination needs.    Engaged with patient by telephone for initial visit in response to provider referral for pharmacy case management and/or care coordination services.   Consent to Services:  The patient was given information about Chronic Care Management services, agreed to services, and gave verbal consent prior to initiation of services.  Please see initial visit note for detailed documentation.   Patient Care Team: Sherri Pitcher, NP as PCP - General (Nurse Practitioner)  Recent office visits: 08/17/22 NP Romilda Garret OV: establish care. Episode of slurred speech, falling and hitting her head. Stat CT ordered - no acute abnormality. LFT slightly elevated, recheck next OV. Other labs stable.  07/19/22 NP Minette Brine ([facility]): f/u  DM. Continue f/u with endo. Rx' [place]. Discharged from practice due to multiple no shows.  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  07/07/22 Dr Kelton Pillar (Endocrine): f/u DM. Dx 2021. Dx changed from T2 to T1 based on elevated islet cell Ab titers. BG range 46 - 600. Advised correction scale with pre-meal glucose. CGM cost-prohibitive.  Hospital visits: None in previous 6 months   Objective:  Lab Results  Component Value Date   CREATININE 1.11 08/17/2022   BUN 17 08/17/2022   GFR 55.00 (L) 08/17/2022   EGFR 52 (L) 07/19/2022   GFRNONAA 52 (L) 03/09/2022    GFRAA 59 (L) 01/25/2021   NA 141 08/17/2022   K 4.1 08/17/2022   CALCIUM 10.0 08/17/2022   CO2 28 08/17/2022   GLUCOSE 86 08/17/2022    Lab Results  Component Value Date/Time   HGBA1C 12.2 (A) 07/07/2022 02:24 PM   HGBA1C 11.3 (A) 03/07/2022 02:51 PM   HGBA1C 12.5 (H) 09/08/2021 03:30 PM   HGBA1C 13.0 (H) 05/06/2021 05:41 AM   GFR 55.00 (L) 08/17/2022 10:55 AM   GFR 55.40 (L) 08/08/2021 10:58 AM   MICROALBUR $RemoveBef'30mg'[id number]$  03/22/2021 11:39 AM   MICROALBUR 30 06/04/2020 12:29 PM    Last diabetic Eye exam: No results found for: "HMDIABEYEEXA"  Last diabetic Foot exam: No results found for: "HMDIABFOOTEX"   Lab Results  Component Value Date   CHOL 172 06/03/2020   HDL 75 06/03/2020   LDLCALC 83 06/03/2020   TRIG 76 06/03/2020   CHOLHDL 2.3 06/03/2020       Latest Ref Rng & Units 08/17/2022   10:55 AM 07/19/2022   10:58 AM 03/09/2022    4:45 PM  Hepatic Function  Total Protein 6.0 - 8.3 g/dL 6.9   7.6   Albumin 3.5 - 5.2 g/dL 4.0  4.4  3.9   AST 0 - 37 U/L 62   35   ALT 0 - 35 U/L 82   41   Alk Phosphatase 39 - 117 U/L 117   129   Total Bilirubin 0.2 - 1.2 mg/dL 0.2   0.4     Lab Results  Component Value Date/Time   TSH 0.91 08/17/2022 10:55 AM   TSH 0.555 09/09/2021  01:42 AM   TSH 2.28 08/08/2021 10:58 AM   FREET4 0.94 08/08/2021 10:58 AM       Latest Ref Rng & Units 08/17/2022   10:55 AM 03/09/2022    4:56 PM 03/09/2022    4:45 PM  CBC  WBC 4.0 - 10.5 K/uL 5.9   5.0   Hemoglobin 12.0 - 15.0 g/dL 12.2  13.3  12.0   Hematocrit 36.0 - 46.0 % 36.5  39.0  36.0   Platelets 150.0 - 400.0 K/uL 223.0   229     Lab Results  Component Value Date/Time   VD25OH 74.0 12/26/2016 10:02 AM   VD25OH 58.8 08/24/2016 10:11 AM    Clinical ASCVD: {YES/NO:21197} The 10-year ASCVD risk score (Arnett DK, et al., 2019) is: 6.4%   Values used to calculate the score:     Age: 60 years     Sex: Female     Is Non-Hispanic African American: Yes     Diabetic: Yes     Tobacco smoker: Yes      Systolic Blood Pressure: 277 mmHg     Is BP treated: No     HDL Cholesterol: 75 mg/dL     Total Cholesterol: 172 mg/dL       11/10/2021    2:34 PM 07/14/2021   12:27 PM 06/03/2020    3:43 PM  Depression screen PHQ 2/9  Decreased Interest 0 0 0  Down, Depressed, Hopeless 0 0 0  PHQ - 2 Score 0 0 0  Altered sleeping   0  Tired, decreased energy   0  Change in appetite   0  Feeling bad or failure about yourself    0  Trouble concentrating   0  Moving slowly or fidgety/restless   0  Suicidal thoughts   0  PHQ-9 Score   0  Difficult doing work/chores   Not difficult at all     ***Other: (CHADS2VASc if Afib, MMRC or CAT for COPD, ACT, DEXA)  Social History   Tobacco Use  Smoking Status Every Day   Types: Cigars  Smokeless Tobacco Never  Tobacco Comments   Never Used Tobacco   BP Readings from Last 3 Encounters:  08/17/22 104/72  07/19/22 118/60  07/10/22 94/65   Pulse Readings from Last 3 Encounters:  08/17/22 83  07/19/22 100  07/10/22 96   Wt Readings from Last 3 Encounters:  08/17/22 109 lb 6 oz (49.6 kg)  07/19/22 107 lb 3.2 oz (48.6 kg)  07/07/22 109 lb (49.4 kg)   BMI Readings from Last 3 Encounters:  08/17/22 19.37 kg/m  07/19/22 18.99 kg/m  07/10/22 19.31 kg/m    Assessment/Interventions: Review of patient past medical history, allergies, medications, health status, including review of consultants reports, laboratory and other test data, was performed as part of comprehensive evaluation and provision of chronic care management services.   SDOH:  (Social Determinants of Health) assessments and interventions performed: {yes/no:20286}  SDOH Screenings   Alcohol Screen: Not on file  Depression (PHQ2-9): Low Risk  (11/10/2021)   Depression (PHQ2-9)    PHQ-2 Score: 0  Financial Resource Strain: High Risk (07/14/2021)   Overall Financial Resource Strain (CARDIA)    Difficulty of Paying Living Expenses: Very hard  Food Insecurity: Food Insecurity  Present (07/13/2022)   Hunger Vital Sign    Worried About Running Out of Food in the Last Year: Often true    Ran Out of Food in the Last Year: Sometimes true  Housing: High Risk (  06/01/2021)   Housing    Last Housing Risk Score: 2  Physical Activity: Inactive (07/14/2021)   Exercise Vital Sign    Days of Exercise per Week: 0 days    Minutes of Exercise per Session: 0 min  Social Connections: Not on file  Stress: No Stress Concern Present (06/03/2020)   [employer] of Occupational Health - Occupational Stress Questionnaire    Feeling of Stress : Not at all  Tobacco Use: High Risk (08/17/2022)   Patient History    Smoking Tobacco Use: Every Day    Smokeless Tobacco Use: Never    Passive Exposure: Not on file  Transportation Needs: No Transportation Needs (07/14/2021)   PRAPARE - Transportation    Lack of Transportation (Medical): No    Lack of Transportation (Non-Medical): No    CCM Care Plan  Allergies  Allergen Reactions   Ibuprofen Other (See Comments)    Does not take due to hx of renal insufficiency "I have kidney disease"    Lemon Flavor Swelling    Severe Lip Swelling  FRUIT per pt.     Amoxicillin Diarrhea and Other (See Comments)   Tylenol [Acetaminophen] Hives    Cannot take large quantities   Dust Mite Extract Hives    Other reaction(s): Other (See Comments) (as well as paper mites)   Molds & Smuts Hives and Swelling    Other reaction(s): Other (See Comments) Face swelling    Medications Reviewed Today     Reviewed by Eden Emms, NP (Nurse Practitioner) on 08/17/22 at 1025  Med List Status: <None>   Medication Order Taking? Sig Documenting Provider Last Dose Status Informant  ACCU-CHEK SMARTVIEW test strip [id number] Yes USE AS INSTRUCTED TO CHECK SUGARS BEFORE MEALS AND AT BEDTIME [name], [name], MD Taking Active   Albuterol Sulfate (PROAIR RESPICLICK) 108 (90 Base) MCG/ACT AEPB [id number] Yes Inhale into the lungs. [provider] Taking Active   Ascorbic Acid (VITAMIN C) 1000 MG tablet [id number] Yes TAKE 1 TABLET BY MOUTH EVERY DAY Arnette Felts, FNP Taking Active   baclofen (LIORESAL) 20 MG tablet [id number] Yes TAKE 1 TABLET BY MOUTH 3  TIMES DAILY Glean Salvo, NP Taking Active   cetirizine (ZYRTEC ALLERGY) 10 MG tablet [id number] Yes Take 1 tablet (10 mg total) by mouth daily. Arnette Felts, FNP Taking Active   Cholecalciferol (VITAMIN D3) 10 MCG (400 UNIT) tablet [id number] Yes Take 400 Units by mouth daily. [provider] Taking Active Self  Cranberry-Vitamin C-Vitamin E 4200-20-3 MG-MG-UNIT CAPS [id number] Yes Take 1 tablet by mouth daily. [provider] Taking Active Self  desloratadine (CLARINEX) 5 MG tablet [id number] Yes TAKE 1 TABLET BY MOUTH DAILY Arnette Felts, FNP Taking Active   diazepam (VALIUM) 10 MG tablet [id number] Yes TAKE 2 TABLETS BY MOUTH IN THE  MORNING, AT NOON, AND AT BEDTIME Penumalli, Vikram R, MD Taking Active   EPINEPHRINE 0.3 mg/0.3 mL IJ SOAJ injection [id number] Yes INJECT INTRAMUSCULARLY 1 PEN AS  NEEDED FOR ALLERGIC RESPONSE AS  DIRECTED BY MD. [place] Found MEDICAL  HELP AFTER USE. Arnette Felts, FNP Taking Active   famotidine (PEPCID) 20 MG tablet [id number] Yes TAKE 1 TABLET BY MOUTH  TWICE DAILY Arnette Felts, FNP Taking Active   FLUoxetine (PROZAC) 10 MG capsule [id number] Yes TAKE 1 CAPSULE BY MOUTH DAILY Glean Salvo, NP Taking Active   fluticasone (FLONASE) 50 MCG/ACT nasal spray [id number] Yes Place 2 sprays into both nostrils daily. Bing Neighbors, FNP Taking Active  gabapentin (NEURONTIN) 300 MG capsule [id number] Yes TAKE 1 CAPSULE BY MOUTH TWICE  DAILY Suzzanne Cloud, NP Taking Active   Glucagon (GVOKE HYPOPEN 2-PACK) 0.5 MG/0.1ML SOAJ [id number] No Inject 0.1 mLs into the skin as needed (if blood sugar less than 70).  Patient not taking: Reported on 08/17/2022   Minette Brine, [place] Not Taking Active            Med Note Georgiann Cocker, Ethelle Lyon Aug 17, 2022  9:58  AM) Did not start on this yet  hydrOXYzine (VISTARIL) 100 MG capsule [id number] Yes TAKE 1 CAPSULE BY MOUTH 3  TIMES DAILY AS NEEDED FOR  ITCHING  Patient taking differently: Take 100 mg by mouth 3 (three) times daily as needed for itching.   Minette Brine, FNP Taking Active   insulin aspart (NOVOLOG FLEXPEN) 100 UNIT/ML FlexPen [id number] Yes Max daily 30 units [name], Melanie Crazier, MD Taking Active   insulin degludec (TRESIBA) 100 UNIT/ML FlexTouch Pen [id number] Yes Inject 10 Units into the skin daily. [name], Melanie Crazier, MD Taking Active   Insulin Pen Needle 32G X 4 MM MISC [id number] Yes 1 each by Does not apply route 2 (two) times daily. Dx E11.9 Minette Brine, FNP Taking Active Self  vitamin E 180 MG (400 UNITS) capsule [id number] Yes Take 400 Units by mouth daily.  [provider] Taking Active Self            Patient Active Problem List   Diagnosis Date Noted   Fall 08/17/2022   LOC (loss of consciousness) ([place]) 08/17/2022   Slurred speech 08/17/2022   Type 1 diabetes mellitus with hyperglycemia ([place]) 11/11/2021   Malnutrition of moderate degree 09/09/2021   AMS (altered mental status) 09/09/2021   Acute encephalopathy 09/08/2021   Acute lower UTI 09/08/2021   Elevated LFTs 09/08/2021   Elevated lipase 09/08/2021   Hashimoto's disease 08/09/2021   Hypoglycemia 05/05/2021   AKI (acute kidney injury) ([place]) 05/05/2021   Anxiety    Chronic rhinitis 04/24/2019   Keloid 10/10/2018   Depression 01/02/2017   History of DVT (deep vein thrombosis) 01/02/2017   Chronic pain syndrome 01/02/2017   Cocaine abuse ([place]) 01/02/2017   Dysphagia    Adjustment insomnia    Distal radius fracture, right 08/16/2016   Postconcussion syndrome 12/30/2014   Tremor 03/11/2014   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    Medication Assistance: {MEDASSISTANCEINFO:25044}  Compliance/Adherence/Medication fill history: Care Gaps: Eye exam (never done) Foot exam (due 01/25/22) Mammogram (due 09/10/19)  Star-Rating Drugs: None  Medication Access: Within the past 30 days, how often has patient missed a dose of medication? *** Is a pillbox or other method used to improve adherence? {YES/NO:21197} Factors that may affect medication adherence? {CHL DESC; BARRIERS:21522} Are meds synced by current pharmacy? {YES/NO:21197} Are meds delivered by current pharmacy? {YES/NO:21197} Does patient experience delays in picking up medications due to transportation concerns? {YES/NO:21197}  [facility] Reviewed: Is patient disadvantaged to  use [facility]?: {YES/NO:21197} Current Rx insurance plan: *** Name and location of Current pharmacy:  [id number] [place], [address] [place] [address] Phone: [phone] Fax: [phone]  [facility] ([facility] - [place], [address] [place] [address] Phone: [phone] Fax: [phone]  [facility] services reviewed with patient today?: {YES/NO:21197} Patient requests to transfer care to [facility]?: {YES/NO:21197} Reason patient declined to change pharmacies: {US patient preference:27474}   Care Plan and Follow Up Patient Decision:  {FOLLOWUP:24991}  Plan: {CM FOLLOW UP PLAN:25073}  ***     Current Barriers:   Unable to independently monitor therapeutic efficacy Unable to self administer medications as prescribed Does not adhere to prescribed medication regimen Does not maintain contact with provider office Does not contact provider office for questions/concerns  Pharmacist Clinical Goal(s):  Patient will verbalize ability to afford treatment regimen achieve adherence to monitoring guidelines and medication adherence to achieve therapeutic efficacy contact provider office for questions/concerns as evidenced notation of same in electronic health record through collaboration with PharmD and provider.   Interventions: 1:1 collaboration with Minette Brine, FNP regarding development and update of comprehensive plan of care as evidenced by provider attestation and co-signature Inter-disciplinary care team collaboration (see longitudinal plan of care) Comprehensive medication review performed; medication list updated in electronic medical record  Diabetes (A1c goal <7%) -{US controlled/uncontrolled:25276} -DM Dx 2021. Dx changed from T2 to T1 based on elevated islet cell Ab titers.  -Pt has tried to get CGM before - Dexcom was cost-prohibitive ($28) in 2022; [facility] -Current home glucose readings:  fasting glucose: *** -Denies hypoglycemic/hyperglycemic symptoms -Current medications: Tresiba 10 units daily Novolog SSI [facility] Hypopen Testing supplies -Medications previously tried: [place], glipizide, metformin -Current meal patterns:  breakfast: oatmeal with sugar free yogurt, egg and cheese rolls   lunch: sandwich, or a hot dog bun with sliced ham and mayonnaise with sugar free yogurt dinner: microwave ready green beans, with red potatoes, and a sandwich snacks: glucerna drinks: zero gatorade, diet cranberry juice, diet ginger ale - drink half bottle and leave the rest, diet ocean spray cranberry mango juice . Drinking more water -Current exercise: not currently  exercising -Educated on A1c and blood sugar goals; Complications of diabetes including kidney damage, retinal damage, and cardiovascular disease; Prevention and management of hypoglycemic episodes; -Counseled to check feet daily and get yearly eye exams -Recommended to continue current medication  Asthma / Allergies (Goal: control symptoms and prevent exacerbations) -Controlled -Spirometry 04/2019 - FEV1 99% predicted; FEV1/FVC 0.73 -Current treatment  Albuterol HFA PRN Cetirizine 10 mg daily Desloratadine 5 mg daily Flonase PRN Hydroxyzine 100 mg daily PRN itching Famotidine 20 mg BID Epi-Pen PRN -Exacerbations requiring treatment in last 6 months: 0 -Patient denies consistent use of maintenance inhaler -Frequency of rescue inhaler use: rare, none in the past four months  -Counseled on Proper inhaler technique; When to use rescue inhaler -Recommended to continue current medication  Depression/Anxiety (Goal: ***) -{US controlled/uncontrolled:25276} -PHQ9: 0 (10/2021) -GAD7: n/a -Connected with Neurology for mental health support -Current treatment: Fluoxetine 10 mg daily Diazepam 10 mg - 2 tab TID Hydroxyzine 100 mg daily PRN itching -Medications previously tried/failed: *** -Educated on {CCM mental health counseling:25127} -{CCMPHARMDINTERVENTION:25122}  Stiff person syndrome (Goal: ***) -{US controlled/uncontrolled:25276} -Follows with neurology, Dr Leta Baptist -Current treatment  Baclofen 20 mg TID Gabapentin 300 mg BID Diazepam 10 mg - 2  tab TID -Medications previously tried: ***  -{CCMPHARMDINTERVENTION:25122}  GERD (Goal: minimize symptoms of reflux ) -{US controlled/uncontrolled:25276} -Current treatment  Famotidine 20 mg BID -Medications previously tried: none reported  -Triggering factors: *** -Hx of Bleeds/ulcers: {yes/no:20286} -Counseled on small meals, elevating head, and sleeping on left side -{CCMPHARMDINTERVENTION:25122}  Health  Maintenance -Vaccine gaps: *** -Current therapy:  Vitamin C 1000 mg Vitamin D 400 IU Vitamin E 400 IU Cranberry - Vitamin C - Vitamin E -Educated on {ccm supplement counseling:25128} -{CCM Patient satisfied:25129} -{CCMPHARMDINTERVENTION:25122}  Patient Goals/Self-Care Activities Patient will:  - take medications as prescribed  Follow Up Plan: The patient has been provided with contact information for the care management team and has been advised to call with any health related questions or concerns.

## 2022-08-24 ENCOUNTER — Telehealth: Payer: Self-pay | Admitting: Nurse Practitioner

## 2022-08-24 ENCOUNTER — Telehealth: Payer: Self-pay | Admitting: Diagnostic Neuroimaging

## 2022-08-24 NOTE — Telephone Encounter (Signed)
Called Pt. Left message on vm to give office a call to reschedule appointment. MD will be out the office.

## 2022-08-24 NOTE — Telephone Encounter (Signed)
Left message to call back, please relay information below when patient calls back  "Lelon Mast,   I have reviewed your labs.   Liver enzymes are slightly elevated. We will recheck them at the next office visit. Kidneys are stable. Vitamin B12, Folate, Iron, thyroid, red and white blood cells look good. Let me know if you have any questions   Thanks, Audria Nine, DNP, AGNP-C"

## 2022-08-24 NOTE — Telephone Encounter (Signed)
Patient called in returning a call she received. Thank you! 

## 2022-08-25 ENCOUNTER — Telehealth: Payer: Self-pay

## 2022-08-25 NOTE — Telephone Encounter (Signed)
Attempted to call pt to schedule and go ahead and complete her AWV. I left her a vm to call the office [facility]

## 2022-08-30 NOTE — Telephone Encounter (Signed)
Patient viewed results on mychart message.

## 2022-09-08 ENCOUNTER — Inpatient Hospital Stay: Payer: Medicare Other

## 2022-09-12 ENCOUNTER — Ambulatory Visit: Payer: Medicare Other | Admitting: Diagnostic Neuroimaging

## 2022-09-13 NOTE — Telephone Encounter (Signed)
Contacted the patient and rescheduled appointment with CPP  09/27/22 @2 :[place], [place]  [phone]

## 2022-09-14 ENCOUNTER — Other Ambulatory Visit: Payer: Self-pay | Admitting: *Deleted

## 2022-09-14 DIAGNOSIS — D510 Vitamin B12 deficiency anemia due to intrinsic factor deficiency: Secondary | ICD-10-CM

## 2022-09-14 DIAGNOSIS — D508 Other iron deficiency anemias: Secondary | ICD-10-CM

## 2022-09-15 ENCOUNTER — Ambulatory Visit: Payer: Medicare Other

## 2022-09-15 ENCOUNTER — Telehealth: Payer: Self-pay

## 2022-09-15 ENCOUNTER — Inpatient Hospital Stay: Payer: Medicare Other

## 2022-09-15 ENCOUNTER — Other Ambulatory Visit: Payer: Medicare Other

## 2022-09-15 ENCOUNTER — Inpatient Hospital Stay: Payer: Medicare Other | Attending: Hematology & Oncology

## 2022-09-15 VITALS — BP 126/83 | HR 60 | Temp 98.0°F | Resp 17

## 2022-09-15 DIAGNOSIS — D509 Iron deficiency anemia, unspecified: Secondary | ICD-10-CM | POA: Insufficient documentation

## 2022-09-15 DIAGNOSIS — Z86718 Personal history of other venous thrombosis and embolism: Secondary | ICD-10-CM | POA: Insufficient documentation

## 2022-09-15 DIAGNOSIS — D510 Vitamin B12 deficiency anemia due to intrinsic factor deficiency: Secondary | ICD-10-CM | POA: Diagnosis present

## 2022-09-15 DIAGNOSIS — D508 Other iron deficiency anemias: Secondary | ICD-10-CM

## 2022-09-15 LAB — CBC WITH DIFFERENTIAL (CANCER CENTER ONLY)
Abs Immature Granulocytes: 0.03 10*3/uL (ref 0.00–0.07)
Basophils Absolute: 0 10*3/uL (ref 0.0–0.1)
Basophils Relative: 0 %
Eosinophils Absolute: 0 10*3/uL (ref 0.0–0.5)
Eosinophils Relative: 0 %
HCT: 38.1 % (ref 36.0–46.0)
Hemoglobin: 12.6 g/dL (ref 12.0–15.0)
Immature Granulocytes: 1 %
Lymphocytes Relative: 39 %
Lymphs Abs: 2.1 10*3/uL (ref 0.7–4.0)
MCH: 28.1 pg (ref 26.0–34.0)
MCHC: 33.1 g/dL (ref 30.0–36.0)
MCV: 84.9 fL (ref 80.0–100.0)
Monocytes Absolute: 0.3 10*3/uL (ref 0.1–1.0)
Monocytes Relative: 6 %
Neutro Abs: 3 10*3/uL (ref 1.7–7.7)
Neutrophils Relative %: 54 %
Platelet Count: 189 10*3/uL (ref 150–400)
RBC: 4.49 MIL/uL (ref 3.87–5.11)
RDW: 11.8 % (ref 11.5–15.5)
WBC Count: 5.5 10*3/uL (ref 4.0–10.5)
nRBC: 0 % (ref 0.0–0.2)

## 2022-09-15 LAB — CMP (CANCER CENTER ONLY)
ALT: 41 U/L (ref 0–44)
AST: 26 U/L (ref 15–41)
Albumin: 4.2 g/dL (ref 3.5–5.0)
Alkaline Phosphatase: 114 U/L (ref 38–126)
Anion gap: 7 (ref 5–15)
BUN: 13 mg/dL (ref 6–20)
CO2: 29 mmol/L (ref 22–32)
Calcium: 9.8 mg/dL (ref 8.9–10.3)
Chloride: 102 mmol/L (ref 98–111)
Creatinine: 1.18 mg/dL — ABNORMAL HIGH (ref 0.44–1.00)
GFR, Estimated: 54 mL/min — ABNORMAL LOW (ref >60)
Glucose, Bld: 244 mg/dL — ABNORMAL HIGH (ref 70–99)
Potassium: 4.1 mmol/L (ref 3.5–5.1)
Sodium: 138 mmol/L (ref 135–145)
Total Bilirubin: 0.4 mg/dL (ref 0.3–1.2)
Total Protein: 7.1 g/dL (ref 6.5–8.1)

## 2022-09-15 LAB — IRON AND IRON BINDING CAPACITY (CC-WL,HP ONLY)
Iron: 81 ug/dL (ref 28–170)
Saturation Ratios: 32 % — ABNORMAL HIGH (ref 10.4–31.8)
TIBC: 256 ug/dL (ref 250–450)
UIBC: 175 ug/dL (ref 148–442)

## 2022-09-15 LAB — FERRITIN: Ferritin: 409 ng/mL — ABNORMAL HIGH (ref 11–307)

## 2022-09-15 LAB — VITAMIN B12: Vitamin B-12: 345 pg/mL (ref 180–914)

## 2022-09-15 MED ORDER — CYANOCOBALAMIN 1000 MCG/ML IJ SOLN
1000.0000 ug | Freq: Once | INTRAMUSCULAR | Status: AC
  Administered 2022-09-15: 1000 ug via INTRAMUSCULAR
  Filled 2022-09-15: qty 1
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
## 2022-09-15 NOTE — Patient Instructions (Signed)
Vitamin B12 Deficiency Vitamin B12 deficiency occurs when the body does not have enough of this important vitamin. The body needs this vitamin: To make red blood cells. To make DNA. This is the genetic material inside cells. To help the nerves work properly so they can carry messages from the brain to the body. Vitamin B12 deficiency can cause health problems, such as not having enough red blood cells in the blood (anemia). This can lead to nerve damage if untreated. What are the causes? This condition may be caused by: Not eating enough foods that contain vitamin B12. Not having enough stomach acid and digestive fluids to properly absorb vitamin B12 from the food that you eat. Having certain diseases that make it hard to absorb vitamin B12. These diseases include Crohn's disease, chronic pancreatitis, and cystic fibrosis. An autoimmune disorder in which the body does not make enough of a protein (intrinsic factor) within the stomach, resulting in not enough absorption of vitamin B12. Having a surgery in which part of the stomach or small intestine is removed. Taking certain medicines that make it hard for the body to absorb vitamin B12. These include: Heartburn medicines, such as antacids and proton pump inhibitors. Some medicines that are used to treat diabetes. What increases the risk? The following factors may make you more likely to develop a vitamin B12 deficiency: Being an older adult. Eating a vegetarian or vegan diet that does not include any foods that come from animals. Eating a poor diet while you are pregnant. Taking certain medicines. Having alcoholism. What are the signs or symptoms? In some cases, there are no symptoms of this condition. If the condition leads to anemia or nerve damage, various symptoms may occur, such as: Weakness. Tiredness (fatigue). Loss of appetite. Numbness or tingling in your hands and feet. Redness and burning of the tongue. Depression,  confusion, or memory problems. Trouble walking. If anemia is severe, symptoms can include: Shortness of breath. Dizziness. Rapid heart rate. How is this diagnosed? This condition may be diagnosed with a blood test to measure the level of vitamin B12 in your blood. You may also have other tests, including: A group of tests that measure certain characteristics of blood cells (complete blood count, CBC). A blood test to measure intrinsic factor. A procedure where a thin tube with a camera on the end is used to look into your stomach or intestines (endoscopy). Other tests may be needed to discover the cause of the deficiency. How is this treated? Treatment for this condition depends on the cause. This condition may be treated by: Changing your eating and drinking habits, such as: Eating more foods that contain vitamin B12. Drinking less alcohol or no alcohol. Getting vitamin B12 injections. Taking vitamin B12 supplements by mouth (orally). Your health care provider will tell you which dose is best for you. Follow these instructions at home: Eating and drinking  Include foods in your diet that come from animals and contain a lot of vitamin B12. These include: Meats and poultry. This includes beef, pork, chicken, turkey, and organ meats, such as liver. Seafood. This includes clams, rainbow trout, salmon, tuna, and haddock. Eggs. Dairy foods such as milk, yogurt, and cheese. Eat foods that have vitamin B12 added to them (are fortified), such as ready-to-eat breakfast cereals. Check the label on the package to see if a food is fortified. The items listed above may not be a complete list of foods and beverages you can eat and drink. Contact a dietitian for   more information. Alcohol use Do not drink alcohol if: Your health care provider tells you not to drink. You are pregnant, may be pregnant, or are planning to become pregnant. If you drink alcohol: Limit how much you have to: 0-1 drink a  day for women. 0-2 drinks a day for men. Know how much alcohol is in your drink. In the [place] one drink equals one 12 oz bottle of beer (355 mL), one 5 oz glass of wine (148 mL), or one 1 oz glass of hard liquor (44 mL). General instructions Get vitamin B12 injections if told to by your health care provider. Take supplements only as told by your health care provider. Follow the directions carefully. Keep all follow-up visits. This is important. Contact a health care provider if: Your symptoms come back. Your symptoms get worse or do not improve with treatment. Get help right away: You develop shortness of breath. You have a rapid heart rate. You have chest pain. You become dizzy or you faint. These symptoms may be an emergency. Get help right away. Call 911. Do not wait to see if the symptoms will go away. Do not drive yourself to the hospital. Summary Vitamin B12 deficiency occurs when the body does not have enough of this important vitamin. Common causes include not eating enough foods that contain vitamin B12, not being able to absorb vitamin B12 from the food that you eat, having a surgery in which part of the stomach or small intestine is removed, or taking certain medicines. Eat foods that have vitamin B12 in them. Treatment may include making a change in the way you eat and drink, getting vitamin B12 injections, or taking vitamin B12 supplements. This information is not intended to replace advice given to you by your health care provider. Make sure you discuss any questions you have with your health care provider. Document Revised: 08/05/2021 Document Reviewed: 08/05/2021 Elsevier Patient Education  2023 Elsevier Inc.  

## 2022-09-18 ENCOUNTER — Telehealth: Payer: Self-pay | Admitting: Pharmacist

## 2022-09-18 ENCOUNTER — Telehealth: Payer: Medicare Other

## 2022-09-18 NOTE — Telephone Encounter (Signed)
Patient called back in to speak with Mendel Ryder. Thank you!

## 2022-09-18 NOTE — Progress Notes (Deleted)
Chronic Care Management Pharmacy Note  09/18/2022 Name:  Sherri [name] MRN:  [id number] DOB:  05-21-1966  Summary: CCM Initial visit ***  Recommendations/Changes made from today's visit: ***  Plan: -[place] will call patient *** -Pharmacist follow up televisit scheduled for *** -PCP F/U 11/20/22    Subjective: Sherri [name] is an 56 y.o. year old female who is a primary patient of Michela Pitcher, NP.  The CCM team was consulted for assistance with disease management and care coordination needs.    Engaged with patient by telephone for initial visit in response to provider referral for pharmacy case management and/or care coordination services.   Consent to Services:  The patient was given information about Chronic Care Management services, agreed to services, and gave verbal consent prior to initiation of services.  Please see initial visit note for detailed documentation.   Patient Care Team: Michela Pitcher, NP as PCP - General (Nurse Practitioner)  Recent office visits: 08/17/22 NP Romilda Garret OV: establish care. Episode of slurred speech, falling and hitting her head. Stat CT ordered - no acute abnormality. LFT slightly elevated, recheck next OV. Other labs stable.  07/19/22 NP Minette Brine ([facility]): f/u  DM. Continue f/u with endo. Rx' [place]. Discharged from practice due to multiple no shows.  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  07/07/22 Dr Kelton Pillar (Endocrine): f/u DM. Dx 2021. Dx changed from T2 to T1 based on elevated islet cell Ab titers. BG range 46 - 600. Advised correction scale with pre-meal glucose. CGM cost-prohibitive.  Hospital visits: None in previous 6 months   Objective:  Lab Results  Component Value Date   CREATININE 1.18 (H) 09/15/2022   BUN 13 09/15/2022   GFR 55.00 (L) 08/17/2022   EGFR 52 (L) 07/19/2022   GFRNONAA 54 (L) 09/15/2022    GFRAA 59 (L) 01/25/2021   NA 138 09/15/2022   K 4.1 09/15/2022   CALCIUM 9.8 09/15/2022   CO2 29 09/15/2022   GLUCOSE 244 (H) 09/15/2022    Lab Results  Component Value Date/Time   HGBA1C 12.2 (A) 07/07/2022 02:24 PM   HGBA1C 11.3 (A) 03/07/2022 02:51 PM   HGBA1C 12.5 (H) 09/08/2021 03:30 PM   HGBA1C 13.0 (H) 05/06/2021 05:41 AM   GFR 55.00 (L) 08/17/2022 10:55 AM   GFR 55.40 (L) 08/08/2021 10:58 AM   MICROALBUR $RemoveBef'30mg'QzzIdaFAvr$  03/22/2021 11:39 AM   MICROALBUR 30 06/04/2020 12:29 PM    Last diabetic Eye exam: No results found for: "HMDIABEYEEXA"  Last diabetic Foot exam: No results found for: "HMDIABFOOTEX"   Lab Results  Component Value Date   CHOL 172 06/03/2020   HDL 75 06/03/2020   LDLCALC 83 06/03/2020   TRIG 76 06/03/2020   CHOLHDL 2.3 06/03/2020       Latest Ref Rng & Units 09/15/2022   11:35 AM 08/17/2022   10:55 AM 07/19/2022   10:58 AM  Hepatic Function  Total Protein 6.5 - 8.1 g/dL 7.1  6.9    Albumin 3.5 - 5.0 g/dL 4.2  4.0  4.4   AST 15 - 41 U/L 26  62    ALT 0 - 44 U/L 41  82    Alk Phosphatase 38 - 126 U/L 114  117    Total Bilirubin 0.3 - 1.2 mg/dL 0.4  0.2      Lab Results  Component Value Date/Time   TSH 0.91 08/17/2022 10:55 AM   TSH 0.555  09/09/2021 01:42 AM   TSH 2.28 08/08/2021 10:58 AM   FREET4 0.94 08/08/2021 10:58 AM       Latest Ref Rng & Units 09/15/2022   11:35 AM 08/17/2022   10:55 AM 03/09/2022    4:56 PM  CBC  WBC 4.0 - 10.5 K/uL 5.5  5.9    Hemoglobin 12.0 - 15.0 g/dL 12.6  12.2  13.3   Hematocrit 36.0 - 46.0 % 38.1  36.5  39.0   Platelets 150 - 400 K/uL 189  223.0      Lab Results  Component Value Date/Time   VD25OH 74.0 12/26/2016 10:02 AM   VD25OH 58.8 08/24/2016 10:11 AM    Clinical ASCVD: {YES/NO:21197} The 10-year ASCVD risk score (Arnett DK, et al., 2019) is: 12.9%   Values used to calculate the score:     Age: 88 years     Sex: Female     Is Non-Hispanic African American: Yes     Diabetic: Yes     Tobacco smoker:  Yes     Systolic Blood Pressure: 010 mmHg     Is BP treated: No     HDL Cholesterol: 75 mg/dL     Total Cholesterol: 172 mg/dL       11/10/2021    2:34 PM 07/14/2021   12:27 PM 06/03/2020    3:43 PM  Depression screen PHQ 2/9  Decreased Interest 0 0 0  Down, Depressed, Hopeless 0 0 0  PHQ - 2 Score 0 0 0  Altered sleeping   0  Tired, decreased energy   0  Change in appetite   0  Feeling bad or failure about yourself    0  Trouble concentrating   0  Moving slowly or fidgety/restless   0  Suicidal thoughts   0  PHQ-9 Score   0  Difficult doing work/chores   Not difficult at all     ***Other: (CHADS2VASc if Afib, MMRC or CAT for COPD, ACT, DEXA)  Social History   Tobacco Use  Smoking Status Every Day   Types: Cigars  Smokeless Tobacco Never  Tobacco Comments   Never Used Tobacco   BP Readings from Last 3 Encounters:  09/15/22 126/83  08/17/22 104/72  07/19/22 118/60   Pulse Readings from Last 3 Encounters:  09/15/22 60  08/17/22 83  07/19/22 100   Wt Readings from Last 3 Encounters:  08/17/22 109 lb 6 oz (49.6 kg)  07/19/22 107 lb 3.2 oz (48.6 kg)  07/07/22 109 lb (49.4 kg)   BMI Readings from Last 3 Encounters:  08/17/22 19.37 kg/m  07/19/22 18.99 kg/m  07/10/22 19.31 kg/m    Assessment/Interventions: Review of patient past medical history, allergies, medications, health status, including review of consultants reports, laboratory and other test data, was performed as part of comprehensive evaluation and provision of chronic care management services.   SDOH:  (Social Determinants of Health) assessments and interventions performed: {yes/no:20286} SDOH Interventions    Flowsheet Row Chronic Care Management from 03/29/2022 in [facility] Telephone from 06/30/2021 in [facility] Chronic Care Management from 05/25/2021 in [facility] Chronic Care Management from 02/07[facility]2021 [facility] Chronic Care Management from 09/21/2020 in [facility] Chronic Care Management from 07/26/2020 in [facility]  SDOH Interventions        Food Insecurity Interventions Other (Comment)  [patients FNS benefit recently cut. Will begin using food pantry she is established with] -- --  [id number] Referral  [Referral placed to [place] [employer], [employer], Solicitor, and [facility] [place] Referral  [[employer] [facility] [employer]] --  Housing Interventions -- -- --  Kandice Robinsons CCM team and PCP team] -- Intervention Not Indicated --  Financial Strain Interventions -- --  Field seismologist assistance for Tyler Aas, CCM Social Worker involved in patient case.] -- -- -- Intervention Not Indicated      SDOH Screenings   Food Insecurity: Food Insecurity Present (07/13/2022)  Housing: High Risk (06/01/2021)  Transportation Needs: No Transportation Needs (07/14/2021)  Depression (PHQ2-9): Low Risk  (11/10/2021)  Financial Resource Strain: High Risk (07/14/2021)  Physical Activity: Inactive (07/14/2021)  Stress: No Stress Concern Present (06/03/2020)  Tobacco Use: High Risk (08/17/2022)    CCM Care Plan  Allergies  Allergen Reactions   Ibuprofen Other (See Comments)    Does not take due to hx of renal insufficiency "I have kidney disease"    Lemon Flavor Swelling    Severe Lip Swelling  FRUIT per pt.     Amoxicillin Diarrhea and Other (See Comments)   Tylenol [Acetaminophen] Hives    Cannot take large quantities   Dust Mite Extract Hives    Other reaction(s): Other (See Comments) (as well as paper mites)   Molds & Smuts Hives and Swelling    Other reaction(s): Other (See Comments) Face swelling    Medications Reviewed Today     Reviewed by Michela Pitcher, NP (Nurse Practitioner) on 08/17/22 at 19  Med List Status: <None>   Medication Order Taking? Sig Documenting Provider Last Dose Status Informant   ACCU-CHEK SMARTVIEW test strip [id number] Yes USE AS INSTRUCTED TO CHECK SUGARS BEFORE MEALS AND AT BEDTIME [name], Melanie Crazier, MD Taking Active   Albuterol Sulfate ([place]) 623 (90 Base) MCG/ACT AEPB [id number] Yes Inhale into the lungs. [provider] Taking Active   Ascorbic Acid (VITAMIN C) 1000 MG tablet [id number] Yes TAKE 1 TABLET BY MOUTH EVERY DAY Minette Brine, FNP Taking Active   baclofen (LIORESAL) 20 MG tablet [id number] Yes TAKE 1 TABLET BY MOUTH 3  TIMES DAILY Suzzanne Cloud, NP Taking Active   cetirizine (ZYRTEC ALLERGY) 10 MG tablet [id number] Yes Take 1 tablet (10 mg total) by mouth daily. Minette Brine, FNP Taking Active   Cholecalciferol (VITAMIN D3) 10 MCG (400 UNIT) tablet [id number] Yes Take 400 Units by mouth daily. [provider] Taking Active Self  Cranberry-Vitamin C-Vitamin E 4200-20-3 MG-MG-UNIT CAPS [id number] Yes Take 1 tablet by mouth daily. [provider] Taking Active Self  desloratadine (CLARINEX) 5 MG tablet [id number] Yes TAKE 1 TABLET BY MOUTH DAILY Minette Brine, FNP Taking Active   diazepam (VALIUM) 10 MG tablet [id number] Yes TAKE 2 TABLETS BY MOUTH IN THE  MORNING, AT NOON, AND AT BEDTIME Penumalli, Vikram R, MD Taking Active   EPINEPHRINE 0.3 mg/0.3 mL IJ SOAJ injection [id number] Yes INJECT INTRAMUSCULARLY 1 PEN AS  NEEDED FOR ALLERGIC RESPONSE AS  DIRECTED BY MD. [place] AFTER USE. Minette Brine, FNP Taking Active   famotidine (PEPCID) 20 MG tablet [id number] Yes TAKE 1 TABLET BY MOUTH  TWICE DAILY Minette Brine, FNP Taking Active   FLUoxetine (PROZAC) 10 MG capsule [id number] Yes TAKE 1 CAPSULE BY MOUTH DAILY Suzzanne Cloud, NP Taking Active   fluticasone (FLONASE) 50 MCG/ACT nasal spray [id number] Yes Place 2 sprays into both nostrils daily. Scot Jun, FNP Taking Active   gabapentin (NEURONTIN) 300 MG capsule [id number] Yes  TAKE 1 CAPSULE BY MOUTH TWICE  DAILY Suzzanne Cloud, NP Taking Active    Glucagon (GVOKE HYPOPEN 2-PACK) 0.5 MG/0.1ML SOAJ [id number] No Inject 0.1 mLs into the skin as needed (if blood sugar less than 70).  Patient not taking: Reported on 08/17/2022   Minette Brine, [place] Not Taking Active            Med Note Georgiann Cocker, Ethelle Lyon Aug 17, 2022  9:58 AM) Did not start on this yet  hydrOXYzine (VISTARIL) 100 MG capsule [id number] Yes TAKE 1 CAPSULE BY MOUTH 3  TIMES DAILY AS NEEDED FOR  ITCHING  Patient taking differently: Take 100 mg by mouth 3 (three) times daily as needed for itching.   Minette Brine, FNP Taking Active   insulin aspart (NOVOLOG FLEXPEN) 100 UNIT/ML FlexPen [id number] Yes Max daily 30 units [name], Melanie Crazier, MD Taking Active   insulin degludec (TRESIBA) 100 UNIT/ML FlexTouch Pen [id number] Yes Inject 10 Units into the skin daily. [name], Melanie Crazier, MD Taking Active   Insulin Pen Needle 32G X 4 MM MISC [id number] Yes 1 each by Does not apply route 2 (two) times daily. Dx E11.9 Minette Brine, FNP Taking Active Self  vitamin E 180 MG (400 UNITS) capsule [id number] Yes Take 400 Units by mouth daily.  [provider] Taking Active Self            Patient Active Problem List   Diagnosis Date Noted   Fall 08/17/2022   LOC (loss of consciousness) ([place]) 08/17/2022   Slurred speech 08/17/2022   Type 1 diabetes mellitus with hyperglycemia ([place]) 11/11/2021   Malnutrition of moderate degree 09/09/2021   AMS (altered mental status) 09/09/2021   Acute encephalopathy 09/08/2021   Acute lower UTI 09/08/2021   Elevated LFTs 09/08/2021   Elevated lipase 09/08/2021   Hashimoto's disease 08/09/2021   Hypoglycemia 05/05/2021   AKI (acute kidney injury) ([place]) 05/05/2021   Anxiety    Chronic rhinitis 04/24/2019   Keloid 10/10/2018   Depression 01/02/2017   History of DVT (deep vein thrombosis) 01/02/2017   Chronic pain syndrome 01/02/2017   Cocaine abuse ([place]) 01/02/2017   Dysphagia    Adjustment insomnia    Distal radius  fracture, right 08/16/2016   Postconcussion syndrome 12/30/2014   Tremor 03/11/2014   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    Medication Assistance: {MEDASSISTANCEINFO:25044}  Compliance/Adherence/Medication fill history: Care Gaps: Eye exam (never done) Foot exam (due 01/25/22) Mammogram (due 09/10/19)  Star-Rating Drugs: None  Medication Access: Within the past 30 days, how often has patient missed a dose of medication? *** Is a pillbox or other method used to improve adherence? {YES/NO:21197} Factors that may affect medication adherence? {CHL DESC; BARRIERS:21522} Are meds synced by current pharmacy? {YES/NO:21197} Are meds delivered by current pharmacy? {YES/NO:21197} Does patient experience delays in picking up medications due to transportation concerns? {YES/NO:21197}  [facility] Reviewed: Is patient disadvantaged to use [facility]?: {YES/NO:21197} Current Rx insurance  plan: *** Name and location of Current pharmacy:  [id number] [place], [address] [place] [address] Phone: [phone] Fax: [phone]  [facility] - [address], [address] [address] Phone: [phone] Fax: [phone]  [facility] services reviewed with patient today?:  {YES/NO:21197} Patient requests to transfer care to [facility]?: {YES/NO:21197} Reason patient declined to change pharmacies: {US patient preference:27474}   Care Plan and Follow Up Patient Decision:  {FOLLOWUP:24991}  Plan: {CM FOLLOW UP PLAN:25073}  ***     Current Barriers:  Unable to independently monitor therapeutic efficacy Unable to self administer medications as prescribed Does not adhere to prescribed medication regimen Does not maintain contact with provider office Does not contact provider office for questions/concerns  Pharmacist Clinical Goal(s):  Patient will verbalize ability to afford treatment regimen achieve adherence to monitoring guidelines and medication adherence to achieve therapeutic efficacy contact provider office for questions/concerns as evidenced notation of same in electronic health record through collaboration with PharmD and provider.   Interventions: 1:1 collaboration with Minette Brine, FNP regarding development and update of comprehensive plan of care as evidenced by provider attestation and co-signature Inter-disciplinary care team collaboration (see longitudinal plan of care) Comprehensive medication review performed; medication list updated in electronic medical record  Diabetes - T1 (A1c goal <7%) -{US controlled/uncontrolled:25276} - A1c 12.2% (06/2022) -DM Dx 2021. Dx changed from T2 to T1 based on elevated islet cell Ab titers. Follows with endocrine (Dr Kelton Pillar) -Pt has tried to get CGM before - Dexcom was cost-prohibitive ($28) in 2022; [facility] -Current home glucose readings:  fasting glucose: *** -Denies hypoglycemic/hyperglycemic symptoms -Current medications: Tresiba 10 units daily Novolog SSI [place] Hypopen Testing supplies -Medications previously tried: [place], glipizide, metformin -Current meal patterns:  breakfast: oatmeal with sugar free yogurt, egg and cheese rolls   lunch: sandwich, or  a hot dog bun with sliced ham and mayonnaise with sugar free yogurt dinner: microwave ready green beans, with red potatoes, and a sandwich snacks: glucerna drinks: zero gatorade, diet cranberry juice, diet ginger ale - drink half bottle and leave the rest, diet ocean spray cranberry mango juice . Drinking more water -Current exercise: not currently exercising -Educated on A1c and blood sugar goals; Complications of diabetes including kidney damage, retinal damage, and cardiovascular disease; Prevention and management of hypoglycemic episodes; -Counseled to check feet daily and get yearly eye exams -Recommended to continue current medication  Asthma / Allergies (Goal: control symptoms and prevent exacerbations) -Controlled -Spirometry 04/2019 - FEV1 99% predicted; FEV1/FVC 0.73 -Current treatment  Albuterol HFA PRN Cetirizine 10 mg daily Desloratadine 5 mg daily Flonase PRN Hydroxyzine 100 mg daily PRN itching Famotidine 20 mg BID Epi-Pen PRN -Exacerbations requiring treatment in last 6 months: 0 -Patient denies consistent use of maintenance inhaler -Frequency of rescue inhaler use: rare, none in the past four months  -Counseled on Pro[place] inhaler technique; When to use rescue inhaler -Recommended to continue current medication  Hyperlipidemia: (LDL goal < 70) -Uncontrolled - LDL 83 (05/2020), due for repeat lipid panel -Current treatment: None -Medications previously tried: none  -Educated on {CCM HLD Counseling:25126} -{CCMPHARMDINTERVENTION:25122} -  Depression/Anxiety (Goal: ***) -{US controlled/uncontrolled:25276} -PHQ9: 0 (10/2021) -GAD7: n/a -Connected with Neurology for mental health support -Current treatment: Fluoxetine 10 mg daily Diazepam 10 mg - 2 tab TID Hydroxyzine 100 mg daily PRN itching -Medications previously tried/failed: *** -Educated on {  CCM mental health counseling:25127} -{CCMPHARMDINTERVENTION:25122}  Stiff person syndrome (Goal: ***) -{US  controlled/uncontrolled:25276} -Follows with neurology, Dr Leta Baptist -Current treatment  Baclofen 20 mg TID Gabapentin 300 mg BID Diazepam 10 mg - 2 tab TID -Medications previously tried: ***  -{CCMPHARMDINTERVENTION:25122}  GERD (Goal: minimize symptoms of reflux ) -{US controlled/uncontrolled:25276} -Current treatment  Famotidine 20 mg BID -Medications previously tried: none reported  -Triggering factors: *** -Hx of Bleeds/ulcers: {yes/no:20286} -Counseled on small meals, elevating head, and sleeping on left side -{CCMPHARMDINTERVENTION:25122}  Health Maintenance -Vaccine gaps: *** -Current therapy:  Vitamin C 1000 mg Vitamin D 400 IU Vitamin E 400 IU Cranberry - Vitamin C - Vitamin E -Educated on {ccm supplement counseling:25128} -{CCM Patient satisfied:25129} -{CCMPHARMDINTERVENTION:25122}  Patient Goals/Self-Care Activities Patient will:  - {pharmacypatientgoals:24919}

## 2022-09-18 NOTE — Telephone Encounter (Signed)
  Chronic Care Management   Outreach Note  09/18/2022 Name: Sherri [name] MRN: [id number] DOB: 03-15-1966  Referred by: Michela Pitcher, NP  Patient had a phone appointment scheduled with clinical pharmacist today.  An unsuccessful telephone outreach was attempted today. The patient was referred to the pharmacist for assistance with medications, care management and care coordination.   Patient will NOT be penalized in any way for missing a CCM appointment. The no-show fee does not apply.  If possible, a message was left to return call to: [phone] or to [facility]  Charlene Brooke, PharmD, BCACP Clinical Pharmacist [facility] [phone]

## 2022-09-22 NOTE — Telephone Encounter (Signed)
Called patient to reschedule her appointment; no answer; left message.  Charlene Brooke, CPP notified  Marijean Niemann, [place] Clinical Pharmacy Assistant [phone]

## 2022-09-22 NOTE — Telephone Encounter (Signed)
Please reschedule patient per her preference.

## 2022-09-24 ENCOUNTER — Other Ambulatory Visit: Payer: Self-pay | Admitting: Nurse Practitioner

## 2022-09-27 ENCOUNTER — Telehealth: Payer: Self-pay | Admitting: Nurse Practitioner

## 2022-09-27 ENCOUNTER — Telehealth: Payer: Medicare Other

## 2022-09-27 NOTE — Telephone Encounter (Signed)
Romilda Garret NP. Wanted you to see this message from access nurse.

## 2022-09-27 NOTE — Telephone Encounter (Signed)
PLEASE NOTE: All timestamps contained within this report are represented as [place] Standard Time. CONFIDENTIALTY NOTICE: This fax transmission is intended only for the addressee. It contains information that is legally privileged, confidential or otherwise protected from use or disclosure. If you are not the intended recipient, you are strictly prohibited from reviewing, disclosing, copying using or disseminating any of this information or taking any action in reliance on or regarding this information. If you have received this fax in error, please notify us immediately by telephone so that we can arrange for its return to [place]. Phone: [phone], Toll-Free: [phone], Fax: [phone] Page: 1 of 1 Call Id: [id number] [place] [facility] - Client TELEPHONE ADVICE RECORD AccessNurse Patient Name: Sherri [name] Gender: Female DOB: 1966-08-13 Age: 56 Y 24 D Return Phone Number: [phone] (Primary) Address: City/ State/ Zip: [place] [address] Client [place] [facility] - Client Client Site [place] - Day Provider Romilda Garret- NP Contact Type Call Who Is Calling Patient / Member / Family / Caregiver Call Type Triage / Clinical Relationship To Patient Self Return Phone Number [phone] (Primary) Chief Complaint BLOOD SUGAR HIGH - 500 or greater Reason for Call Symptomatic / Request for [name] states her blood sugar is reading high, Caller states the last reading was 600. Translation No Disp. Time Eilene Ghazi Time) Disposition Final User 09/27/2022 12:40:08 PM Send to Urgent Elease Hashimoto 09/27/2022 12:45:39 PM Attempt made - message left Lahoma Crocker 09/27/2022 12:59:24 PM Attempt made - message left Raenette Rover, RN, Zella Ball 09/27/2022 1:21:56 PM FINAL ATTEMPT MADE - message left Yes Raenette Rover RN, Zella Ball Final Disposition 09/27/2022 1:21:56 PM FINAL ATTEMPT MADE  - message left Yes Raenette Rover, RN, Zella Ball

## 2022-09-27 NOTE — Telephone Encounter (Signed)
PLEASE NOTE: All timestamps contained within this report are represented as [place] Standard Time. CONFIDENTIALTY NOTICE: This fax transmission is intended only for the addressee. It contains information that is legally privileged, confidential or otherwise protected from use or disclosure. If you are not the intended recipient, you are strictly prohibited from reviewing, disclosing, copying using or disseminating any of this information or taking any action in reliance on or regarding this information. If you have received this fax in error, please notify us immediately by telephone so that we can arrange for its return to [place]. Phone: [phone], Toll-Free: [phone], Fax: [phone] Page: 1 of 2 Call Id: [id number] [place] [facility] - Client TELEPHONE ADVICE RECORD AccessNurse Patient Name: Sherri [name] Gender: Female DOB: [birth year] Age: 56 Y 24 D Return Phone Number: [phone] (Primary) Address: City/ State/ Zip: [place] [address] Client [place] [facility] - Client Client Site [place] - Day Provider Romilda Garret- NP Contact Type Call Who Is Calling Patient / Member / Family / Caregiver Call Type Triage / Clinical Relationship To Patient Self Return Phone Number [phone] (Primary) Chief Complaint BLOOD SUGAR HIGH - 500 or greater Reason for Call Symptomatic / Request for [place] states she called earlier and the nurse tried calling her back but she didn't answer the phone. She states she has high blood sugar readings. She is at 600 and is very thirsty and has no appetite. She hasn't eaten since Sunday. She is very weak and shaky. Additional Comment She also states she fell against her armoire and hit her head. Translation No Nurse Assessment Nurse: Tito Dine, RN, Neoma Laming Date/Time Eilene Ghazi Time): 09/27/2022 3:14:57 PM Confirm and document reason  for call. If symptomatic, describe symptoms. ---Caller states she has high blood sugar reading of 600 and now her BS is reading HIGH, caller is very thirsty and has no appetite. She hasn't eaten since Sunday. She is very weak and shaky. Caller states she fell twice and hit her head this morning armoire. Does the patient have any new or worsening symptoms? ---Yes Will a triage be completed? ---Yes Related visit to physician within the last 2 weeks? ---No Does the PT have any chronic conditions? (i.e. diabetes, asthma, this includes High risk factors for pregnancy, etc.) ---Yes List chronic conditions. ---Diabetes CKD Autoimmune disease pernicious anemia Is this a behavioral health or substance abuse call? ---No Guidelines Guideline Title Affirmed Question Affirmed Notes Nurse Date/Time (Eastern Time) Diabetes - High Blood Sugar Very weak (e.g., can't stand) Tito Dine, RN, Neoma Laming 09/27/2022 3:19:01 PM PLEASE NOTE: All timestamps contained within this report are represented as [place] Standard Time. CONFIDENTIALTY NOTICE: This fax transmission is intended only for the addressee. It contains information that is legally privileged, confidential or otherwise protected from use or disclosure. If you are not the intended recipient, you are strictly prohibited from reviewing, disclosing, copying using or disseminating any of this information or taking any action in reliance on or regarding this information. If you have received this fax in error, please notify us immediately by telephone so that we can arrange for its return to [place]. Phone: [phone], Toll-Free: [phone], Fax: [phone] Page: 2 of 2 Call Id: [id number] [name]. Time Eilene Ghazi Time) Disposition Final User 09/27/2022 3:12:43 PM Send to Urgent Carmell Austria 09/27/2022 3:21:31 PM Call EMS 911 Now Yes Tito Dine, RNNeoma Laming 09/27/2022 3:29:34 PM 911 Outcome Documentation Tito Dine, RN,  Neoma Laming Reason: [name] said she  will call 911 because she hasn't have time to call. Final Disposition 09/27/2022 3:21:31 PM Call EMS 911 Now Yes Tito Dine, RN, Garrel Ridgel Disagree/Comply Comply Caller Understands Yes PreDisposition InappropriateToAsk Care Advice Given Per Guideline CALL EMS 911 NOW: * Immediate medical attention is needed. You need to hang up and call 911 (or an ambulance). * Triager Discretion: I'll call you back in a few minutes to be sure you were able to reach them. CARE ADVICE given per Diabetes - High Blood Sugar (Adult) guideline. * Hyperglycemia is not as serious a condition. Comments User: Nelwyn Salisbury, RN Date/Time Eilene Ghazi Time): 09/27/2022 3:26:39 PM Caller states she did not have any injury on her head.

## 2022-09-27 NOTE — Telephone Encounter (Signed)
Tried to call patient and got her voicemail. Left a message to call the office back.

## 2022-09-27 NOTE — Telephone Encounter (Signed)
Agree with ED disposition

## 2022-09-27 NOTE — Telephone Encounter (Signed)
Have we contacted patient since getting the triage note?  Is she on her tresiba insulin and novolog? She is followed by endocrinology  If she is really weak and peeing a lot with her glucose over 600 she may need to be seen in the emergency department

## 2022-09-27 NOTE — Telephone Encounter (Signed)
Patient called in stating that her blood sugar meter was reading high and the last number she saw was 600. She stated she has been experiencing some wobbling, no appetite, thirsty, and sweating. Sent over to access nurse.

## 2022-09-27 NOTE — Telephone Encounter (Signed)
Patient called back in. Sent over to acces nurse.

## 2022-09-28 ENCOUNTER — Inpatient Hospital Stay (HOSPITAL_COMMUNITY)
Admission: EM | Admit: 2022-09-28 | Discharge: 2022-10-03 | DRG: 638 | Disposition: A | Discharge status: Home / Self Care | Payer: Medicare Other | Attending: Internal Medicine | Admitting: Internal Medicine

## 2022-09-28 ENCOUNTER — Other Ambulatory Visit: Payer: Self-pay

## 2022-09-28 ENCOUNTER — Inpatient Hospital Stay (HOSPITAL_COMMUNITY): Payer: Medicare Other

## 2022-09-28 ENCOUNTER — Emergency Department (HOSPITAL_COMMUNITY): Payer: Medicare Other

## 2022-09-28 ENCOUNTER — Encounter (HOSPITAL_COMMUNITY): Payer: Self-pay

## 2022-09-28 DIAGNOSIS — E1022 Type 1 diabetes mellitus with diabetic chronic kidney disease: Secondary | ICD-10-CM | POA: Diagnosis present

## 2022-09-28 DIAGNOSIS — Z833 Family history of diabetes mellitus: Secondary | ICD-10-CM | POA: Diagnosis not present

## 2022-09-28 DIAGNOSIS — N183 Chronic kidney disease, stage 3 unspecified: Secondary | ICD-10-CM | POA: Diagnosis present

## 2022-09-28 DIAGNOSIS — Z825 Family history of asthma and other chronic lower respiratory diseases: Secondary | ICD-10-CM

## 2022-09-28 DIAGNOSIS — R000 Tachycardia, unspecified: Secondary | ICD-10-CM | POA: Diagnosis not present

## 2022-09-28 DIAGNOSIS — E1110 Type 2 diabetes mellitus with ketoacidosis without coma: Secondary | ICD-10-CM | POA: Diagnosis present

## 2022-09-28 DIAGNOSIS — E0810 Diabetes mellitus due to underlying condition with ketoacidosis without coma: Secondary | ICD-10-CM | POA: Diagnosis not present

## 2022-09-28 DIAGNOSIS — Z880 Allergy status to penicillin: Secondary | ICD-10-CM | POA: Diagnosis not present

## 2022-09-28 DIAGNOSIS — D631 Anemia in chronic kidney disease: Secondary | ICD-10-CM | POA: Diagnosis present

## 2022-09-28 DIAGNOSIS — Z9102 Food additives allergy status: Secondary | ICD-10-CM | POA: Diagnosis not present

## 2022-09-28 DIAGNOSIS — E860 Dehydration: Secondary | ICD-10-CM | POA: Diagnosis present

## 2022-09-28 DIAGNOSIS — E876 Hypokalemia: Secondary | ICD-10-CM | POA: Diagnosis present

## 2022-09-28 DIAGNOSIS — R739 Hyperglycemia, unspecified: Secondary | ICD-10-CM | POA: Diagnosis not present

## 2022-09-28 DIAGNOSIS — E1111 Type 2 diabetes mellitus with ketoacidosis with coma: Principal | ICD-10-CM

## 2022-09-28 DIAGNOSIS — F17290 Nicotine dependence, other tobacco product, uncomplicated: Secondary | ICD-10-CM | POA: Diagnosis present

## 2022-09-28 DIAGNOSIS — N1330 Unspecified hydronephrosis: Secondary | ICD-10-CM | POA: Diagnosis present

## 2022-09-28 DIAGNOSIS — R531 Weakness: Secondary | ICD-10-CM | POA: Diagnosis not present

## 2022-09-28 DIAGNOSIS — F32A Depression, unspecified: Secondary | ICD-10-CM | POA: Diagnosis present

## 2022-09-28 DIAGNOSIS — Z886 Allergy status to analgesic agent status: Secondary | ICD-10-CM

## 2022-09-28 DIAGNOSIS — G2582 Stiff-man syndrome: Secondary | ICD-10-CM | POA: Diagnosis present

## 2022-09-28 DIAGNOSIS — K219 Gastro-esophageal reflux disease without esophagitis: Secondary | ICD-10-CM | POA: Diagnosis present

## 2022-09-28 DIAGNOSIS — F419 Anxiety disorder, unspecified: Secondary | ICD-10-CM | POA: Diagnosis present

## 2022-09-28 DIAGNOSIS — Z794 Long term (current) use of insulin: Secondary | ICD-10-CM | POA: Diagnosis not present

## 2022-09-28 DIAGNOSIS — K449 Diaphragmatic hernia without obstruction or gangrene: Secondary | ICD-10-CM | POA: Diagnosis not present

## 2022-09-28 DIAGNOSIS — J45909 Unspecified asthma, uncomplicated: Secondary | ICD-10-CM | POA: Diagnosis present

## 2022-09-28 DIAGNOSIS — E1011 Type 1 diabetes mellitus with ketoacidosis with coma: Principal | ICD-10-CM | POA: Diagnosis present

## 2022-09-28 DIAGNOSIS — Z79899 Other long term (current) drug therapy: Secondary | ICD-10-CM | POA: Diagnosis not present

## 2022-09-28 DIAGNOSIS — E1051 Type 1 diabetes mellitus with diabetic peripheral angiopathy without gangrene: Secondary | ICD-10-CM | POA: Diagnosis present

## 2022-09-28 DIAGNOSIS — Z91048 Other nonmedicinal substance allergy status: Secondary | ICD-10-CM

## 2022-09-28 DIAGNOSIS — N179 Acute kidney failure, unspecified: Secondary | ICD-10-CM

## 2022-09-28 DIAGNOSIS — R4182 Altered mental status, unspecified: Secondary | ICD-10-CM | POA: Diagnosis not present

## 2022-09-28 DIAGNOSIS — Z91199 Patient's noncompliance with other medical treatment and regimen due to unspecified reason: Secondary | ICD-10-CM

## 2022-09-28 LAB — HEPATIC FUNCTION PANEL
ALT: 30 U/L (ref 0–44)
AST: 17 U/L (ref 15–41)
Albumin: 4.1 g/dL (ref 3.5–5.0)
Alkaline Phosphatase: 127 U/L — ABNORMAL HIGH (ref 38–126)
Bilirubin, Direct: 0.2 mg/dL (ref 0.0–0.2)
Indirect Bilirubin: 1.3 mg/dL — ABNORMAL HIGH (ref 0.3–0.9)
Total Bilirubin: 1.5 mg/dL — ABNORMAL HIGH (ref 0.3–1.2)
Total Protein: 7.4 g/dL (ref 6.5–8.1)

## 2022-09-28 LAB — BASIC METABOLIC PANEL
Anion gap: 14 (ref 5–15)
BUN: 42 mg/dL — ABNORMAL HIGH (ref 6–20)
BUN: 38 mg/dL — ABNORMAL HIGH (ref 6–20)
CO2: <7 mmol/L — ABNORMAL LOW (ref 22–32)
CO2: 14 mmol/L — ABNORMAL LOW (ref 22–32)
Calcium: 10.2 mg/dL (ref 8.9–10.3)
Calcium: 9.4 mg/dL (ref 8.9–10.3)
Chloride: 101 mmol/L (ref 98–111)
Chloride: 110 mmol/L (ref 98–111)
Creatinine, Ser: 2.1 mg/dL — ABNORMAL HIGH (ref 0.44–1.00)
Creatinine, Ser: 1.86 mg/dL — ABNORMAL HIGH (ref 0.44–1.00)
GFR, Estimated: 27 mL/min — ABNORMAL LOW (ref >60)
GFR, Estimated: 31 mL/min — ABNORMAL LOW (ref >60)
Glucose, Bld: 529 mg/dL (ref 70–99)
Glucose, Bld: 294 mg/dL — ABNORMAL HIGH (ref 70–99)
Potassium: 5.4 mmol/L — ABNORMAL HIGH (ref 3.5–5.1)
Potassium: 3.5 mmol/L (ref 3.5–5.1)
Sodium: 134 mmol/L — ABNORMAL LOW (ref 135–145)
Sodium: 138 mmol/L (ref 135–145)

## 2022-09-28 LAB — URINALYSIS, ROUTINE W REFLEX MICROSCOPIC
Bilirubin Urine: NEGATIVE
Glucose, UA: >=500 mg/dL — AB
Ketones, ur: 80 mg/dL — AB
Leukocytes,Ua: NEGATIVE
Nitrite: NEGATIVE
Protein, ur: 30 mg/dL — AB
Specific Gravity, Urine: 1.016 (ref 1.005–1.030)
pH: 5 (ref 5.0–8.0)

## 2022-09-28 LAB — CBC WITH DIFFERENTIAL/PLATELET
Abs Immature Granulocytes: 0.03 10*3/uL (ref 0.00–0.07)
Basophils Absolute: 0 10*3/uL (ref 0.0–0.1)
Basophils Relative: 0 %
Eosinophils Absolute: 0 10*3/uL (ref 0.0–0.5)
Eosinophils Relative: 0 %
HCT: 48.3 % — ABNORMAL HIGH (ref 36.0–46.0)
Hemoglobin: 14.7 g/dL (ref 12.0–15.0)
Immature Granulocytes: 0 %
Lymphocytes Relative: 6 %
Lymphs Abs: 0.7 10*3/uL (ref 0.7–4.0)
MCH: 28.1 pg (ref 26.0–34.0)
MCHC: 30.4 g/dL (ref 30.0–36.0)
MCV: 92.4 fL (ref 80.0–100.0)
Monocytes Absolute: 0.5 10*3/uL (ref 0.1–1.0)
Monocytes Relative: 4 %
Neutro Abs: 10.6 10*3/uL — ABNORMAL HIGH (ref 1.7–7.7)
Neutrophils Relative %: 90 %
Platelets: 245 10*3/uL (ref 150–400)
RBC: 5.23 MIL/uL — ABNORMAL HIGH (ref 3.87–5.11)
RDW: 13 % (ref 11.5–15.5)
WBC: 11.8 10*3/uL — ABNORMAL HIGH (ref 4.0–10.5)
nRBC: 0 % (ref 0.0–0.2)

## 2022-09-28 LAB — GLUCOSE, CAPILLARY
Glucose-Capillary: 383 mg/dL — ABNORMAL HIGH (ref 70–99)
Glucose-Capillary: 300 mg/dL — ABNORMAL HIGH (ref 70–99)
Glucose-Capillary: 246 mg/dL — ABNORMAL HIGH (ref 70–99)
Glucose-Capillary: 82 mg/dL (ref 70–99)
Glucose-Capillary: 238 mg/dL — ABNORMAL HIGH (ref 70–99)

## 2022-09-28 LAB — CK: Total CK: 81 U/L (ref 38–234)

## 2022-09-28 LAB — TROPONIN I (HIGH SENSITIVITY)
Troponin I (High Sensitivity): 14 ng/L (ref <18)
Troponin I (High Sensitivity): 30 ng/L — ABNORMAL HIGH (ref <18)

## 2022-09-28 LAB — CBG MONITORING, ED
Glucose-Capillary: 493 mg/dL — ABNORMAL HIGH (ref 70–99)
Glucose-Capillary: 442 mg/dL — ABNORMAL HIGH (ref 70–99)
Glucose-Capillary: 454 mg/dL — ABNORMAL HIGH (ref 70–99)

## 2022-09-28 LAB — BETA-HYDROXYBUTYRIC ACID: Beta-Hydroxybutyric Acid: >8 mmol/L — ABNORMAL HIGH (ref 0.05–0.27)

## 2022-09-28 LAB — BLOOD GAS, VENOUS
Acid-base deficit: 24.1 mmol/L — ABNORMAL HIGH (ref 0.0–2.0)
Bicarbonate: 6.7 mmol/L — ABNORMAL LOW (ref 20.0–28.0)
Patient temperature: 37
pCO2, Ven: 29 mmHg — ABNORMAL LOW (ref 44–60)
pH, Ven: 6.97 — CL (ref 7.25–7.43)
pO2, Ven: 82 mmHg — ABNORMAL HIGH (ref 32–45)

## 2022-09-28 LAB — PROCALCITONIN: Procalcitonin: 0.22 ng/mL

## 2022-09-28 LAB — LIPASE, BLOOD
Lipase: 29 U/L (ref 11–51)
Lipase: 28 U/L (ref 11–51)

## 2022-09-28 LAB — I-STAT BETA HCG BLOOD, ED (MC, WL, AP ONLY): I-stat hCG, quantitative: <5 m[IU]/mL (ref <5)

## 2022-09-28 LAB — MAGNESIUM: Magnesium: 2.4 mg/dL (ref 1.7–2.4)

## 2022-09-28 MED ORDER — ONDANSETRON HCL 4 MG/2ML IJ SOLN: 4.0000 mg | Freq: Four times a day (QID) | INTRAMUSCULAR | Status: DC | PRN

## 2022-09-28 MED ORDER — POLYETHYLENE GLYCOL 3350 17 G PO PACK: 17.0000 g | PACK | Freq: Every day | ORAL | Status: DC | PRN

## 2022-09-28 MED ORDER — DEXTROSE IN LACTATED RINGERS 5 % IV SOLN: INTRAVENOUS | Status: DC

## 2022-09-28 MED ORDER — LACTATED RINGERS IV SOLN: INTRAVENOUS | Status: DC

## 2022-09-28 MED ORDER — HEPARIN SODIUM (PORCINE) 5000 UNIT/ML IJ SOLN
5000.0000 [IU] | Freq: Three times a day (TID) | INTRAMUSCULAR | Status: DC
  Administered 2022-10-01 – 2022-10-03 (×6): 5000 [IU] via SUBCUTANEOUS
  Filled 2022-09-28 (×7): qty 1

## 2022-09-28 MED ORDER — LACTATED RINGERS IV BOLUS
1000.0000 mL | Freq: Once | INTRAVENOUS | Status: AC
  Administered 2022-09-28: 1000 mL via INTRAVENOUS

## 2022-09-28 MED ORDER — DOCUSATE SODIUM 100 MG PO CAPS: 100.0000 mg | ORAL_CAPSULE | Freq: Two times a day (BID) | ORAL | Status: DC | PRN

## 2022-09-28 MED ORDER — LACTATED RINGERS IV BOLUS
20.0000 mL/kg | Freq: Once | INTRAVENOUS | Status: AC
  Administered 2022-09-28: 980 mL via INTRAVENOUS

## 2022-09-28 MED ORDER — INSULIN REGULAR(HUMAN) IN NACL 100-0.9 UT/100ML-% IV SOLN
INTRAVENOUS | Status: DC
  Administered 2022-09-28: 6 [IU]/h via INTRAVENOUS
  Filled 2022-09-28: qty 100

## 2022-09-28 MED ORDER — DEXTROSE 50 % IV SOLN: 0.0000 mL | INTRAVENOUS | Status: DC | PRN

## 2022-09-28 MED ORDER — BACLOFEN 10 MG PO TABS
5.0000 mg | ORAL_TABLET | Freq: Three times a day (TID) | ORAL | Status: DC
  Administered 2022-09-28: 5 mg via ORAL
  Filled 2022-09-28 (×2): qty 1

## 2022-09-28 NOTE — ED Triage Notes (Signed)
BIB EMS for lethargy, hyperglycemia. Pt glucose 463 per EMS

## 2022-09-28 NOTE — H&P (Signed)
NAME:  Sherri [name], MRN:  [id number], DOB:  1966/01/14, LOS: 0 ADMISSION DATE:  09/28/2022, CONSULTATION DATE:  09/28/22 REFERRING MD: Wyvonnia Dusky, MD  CHIEF COMPLAINT:  DKA   History of Present Illness:  Sherri [name] is a 56 year old woman with history of DMI, stiff person syndrome, hiatal hernia, asthma and anemia who called EMS today for feeling lethargic and having high blood sugars. At time of arrival to ER she has altered mentation. She was found to be in DKA with glucose of 529, VBG pH 6.97, serum bicarb <7 and Cr 2.1. Beta hydroxybutyrate is pending. Head CT was unremarkable along with chest x-ray. She denies any symptoms of infection. She reports running out of tresiba recently and received a new sample from her Endocrinology clinic but did not use it for 3 days.   She has been given 2L of LR in the ER. Her mentation has improved.   PCCM called for admission.   Pertinent  Medical History   Past Medical History:  Diagnosis Date   Angioedema    Anxiety    Arthritis    Asthma    Benzodiazepine withdrawal ([place]) 08/30/2012   CAP (community acquired pneumonia) 08/29/2012   Chronic back pain    Chronic kidney disease    Closed nondisplaced fracture of proximal phalanx of right little finger 10/18/2018   Cocaine abuse ([place])    Depression    Diarrhea    DVT (deep venous thrombosis) (HCC)    Environmental allergies    Fall    GERD (gastroesophageal reflux disease)    Heart murmur    has been told once that she has a heart murmur, but has never had any problems   Hiatal hernia 03/21/2013   Lumbar herniated disc    Peripheral vascular disease (HCC)    Pernicious anemia 10/30/2011   Postconcussion syndrome 12/30/2014   Rhabdomyolysis 08/27/2012   Seasonal allergies    Stiff person syndrome    Urticaria      Significant Hospital Events: Including procedures, antibiotic start and stop dates in addition to other pertinent events   10/5 admit to step-down unit for DKA  Interim  History / Subjective:  As above  Objective   Blood pressure (!) 140/86, pulse (!) 106, temperature 97.6 F (36.4 C), temperature source Oral, resp. rate (!) 21, height 5\' 3"  (1.6 m), weight 49 kg, last menstrual period 07/29/2012, SpO2 100 %.        Intake/Output Summary (Last 24 hours) at 09/28/2022 1754 Last data filed at 09/28/2022 1729 Gross per 24 hour  Intake 1980 ml  Output --  Net 1980 ml   Filed Weights   09/28/22 1535  Weight: 49 kg   Examination: General: middle aged woman, no acute distress HENT: [place]/AT, dry mucous membranes Lungs: clear to auscultation, no wheezing Cardiovascular: tachycardic, no murmurs Abdomen: soft, non-tender, non-distended, BS+ Extremities: warm, no edema Neuro: alert, moving all extremities GU: pure wick in place  Resolved Hospital Problem list     Assessment & Plan:  Diabetic Ketoacidosis DMI - DKA protocol initiated in ER - Continue insulin drip - LR at 18mL/hr until blood glucose less than 250 then transition to D5LR at 189mL/hr - replete potassium PRN - T2IZ metabolic panels - transition to insulin subq when AG below 14 for two consecutive BMPs - PRN boluses - Etiology of DKA appears to be her 3 days of missed long acting insulin - Check procalcitonin, lipase and drug screen - No ischemic changes on  EKG  Acute Kidney Injury on CKDIII Anion Gap Metabolic Acidosis - Fluids as above - check UA - renal US  Leukocytosis - in setting of stress due to DKA - low concern for infectious etiology at this time - monitor  Stiff Person Syndrome - resume fluoxetine, valium, hydroxyzine when able - resume baclofen 5mg  TID due to renal function, home dose is 20mg  TID  Best Practice (right click and "Reselect all SmartList Selections" daily)   Diet/type: NPO w/ oral meds DVT prophylaxis: prophylactic heparin  GI prophylaxis: N/A Lines: N/A Foley:  N/A Code Status:  full code Last date of multidisciplinary goals of care  discussion [10/5 discussed with patient in the ER]  Labs   CBC: Recent Labs  Lab 09/28/22 1527  WBC 11.8*  NEUTROABS 10.6*  HGB 14.7  HCT 48.3*  MCV 92.4  PLT 245    Basic Metabolic Panel: Recent Labs  Lab 09/28/22 1527  NA 134*  K 5.4*  CL 101  CO2 <7*  GLUCOSE 529*  BUN 42*  CREATININE 2.10*  CALCIUM 10.2   GFR: Estimated Creatinine Clearance: 22.6 mL/min (A) (by C-G formula based on SCr of 2.1 mg/dL (H)). Recent Labs  Lab 09/28/22 1527  WBC 11.8*    Liver Function Tests: No results for input(s): "AST", "ALT", "ALKPHOS", "BILITOT", "PROT", "ALBUMIN" in the last 168 hours. Recent Labs  Lab 09/28/22 1527  LIPASE 29   No results for input(s): "AMMONIA" in the last 168 hours.  ABG    Component Value Date/Time   HCO3 6.7 (L) 09/28/2022 1601   TCO2 28 03/09/2022 1656   ACIDBASEDEF 24.1 (H) 09/28/2022 1601   O2SAT NOT CALCULATED 09/28/2022 1601     Coagulation Profile: No results for input(s): "INR", "PROTIME" in the last 168 hours.  Cardiac Enzymes: No results for input(s): "CKTOTAL", "CKMB", "CKMBINDEX", "TROPONINI" in the last 168 hours.  HbA1C: Hemoglobin A1C  Date/Time Value Ref Range Status  07/07/2022 02:24 PM 12.2 (A) 4.0 - 5.6 % Final  03/07/2022 02:51 PM 11.3 (A) 4.0 - 5.6 % Final   Hgb A1c MFr Bld  Date/Time Value Ref Range Status  09/08/2021 03:30 PM 12.5 (H) 4.8 - 5.6 % Final    Comment:    (NOTE) Pre diabetes:          5.7%-6.4%  Diabetes:              >6.4%  Glycemic control for   <7.0% adults with diabetes   05/06/2021 05:41 AM 13.0 (H) 4.8 - 5.6 % Final    Comment:    (NOTE) Pre diabetes:          5.7%-6.4%  Diabetes:              >6.4%  Glycemic control for   <7.0% adults with diabetes     CBG: Recent Labs  Lab 09/28/22 1525 09/28/22 1721  GLUCAP 493* 442*    Review of Systems:   Review of Systems  Constitutional:  Positive for malaise/fatigue. Negative for chills, fever and weight loss.  HENT:  Negative  for congestion, sinus pain and sore throat.   Eyes: Negative.   Respiratory:  Negative for cough, hemoptysis, sputum production, shortness of breath and wheezing.   Cardiovascular:  Negative for chest pain, palpitations, orthopnea, claudication and leg swelling.  Gastrointestinal:  Positive for abdominal pain. Negative for heartburn, nausea and vomiting.  Genitourinary: Negative.   Musculoskeletal:  Negative for joint pain and myalgias.  Skin:  Negative for rash.  Neurological:  Positive for weakness.  Endo/Heme/Allergies: Negative.   Psychiatric/Behavioral: Negative.     Past Medical History:  She,  has a past medical history of Angioedema, Anxiety, Arthritis, Asthma, Benzodiazepine withdrawal (HCC) (08/30/2012), CAP (community acquired pneumonia) (08/29/2012), Chronic back pain, Chronic kidney disease, Closed nondisplaced fracture of proximal phalanx of right little finger (10/18/2018), Cocaine abuse (HCC), Depression, Diarrhea, DVT (deep venous thrombosis) (HCC), Environmental allergies, Fall, GERD (gastroesophageal reflux disease), Heart murmur, Hiatal hernia (03/21/2013), Lumbar herniated disc, Peripheral vascular disease (HCC), Pernicious anemia (10/30/2011), Postconcussion syndrome (12/30/2014), Rhabdomyolysis (08/27/2012), Seasonal allergies, Stiff person syndrome, and Urticaria.   Surgical History:   Past Surgical History:  Procedure Laterality Date   BUNIONECTOMY Left    colonoscopy  07/09/15   inguinal hernia 1983  1983   OPEN REDUCTION INTERNAL FIXATION (ORIF) DISTAL RADIAL FRACTURE Right 08/16/2016   Procedure: OPEN REDUCTION INTERNAL FIXATION (ORIF) DISTAL RADIAL FRACTURE;  Surgeon: Eldred Manges, MD;  Location: [facility] OR;  Service: Orthopedics;  Laterality: Right;     Social History:   reports that she has been smoking cigars. She has never used smokeless tobacco. She reports that she does not currently use alcohol. She reports that she does not currently use drugs after having used the  following drugs: Cocaine.   Family History:  Her family history includes Asthma in her brother; COPD in her father; Cancer (age of onset: 67) in her brother; Cancer (age of onset: 69) in her mother; Diabetes in her mother; Heart attack in her brother; Other in her mother; Seizures in her brother; Transient ischemic attack in her father. There is no history of Allergic rhinitis, Angioedema, Eczema, Immunodeficiency, or Urticaria.   Allergies Allergies  Allergen Reactions   Ibuprofen Other (See Comments)    Does not take due to hx of renal insufficiency "I have kidney disease"    Lemon Flavor Swelling    Severe Lip Swelling  FRUIT per pt.     Amoxicillin Diarrhea and Other (See Comments)   Tylenol [Acetaminophen] Hives    Cannot take large quantities   Dust Mite Extract Hives    Other reaction(s): Other (See Comments) (as well as paper mites)   Molds & Smuts Hives and Swelling    Other reaction(s): Other (See Comments) Face swelling     Home Medications  Prior to Admission medications   Medication Sig Start Date End Date Taking? Authorizing Provider  ACCU-CHEK SMARTVIEW test strip USE AS INSTRUCTED TO CHECK SUGARS BEFORE MEALS AND AT BEDTIME 07/18/22   [name], [name], MD  Albuterol Sulfate (PROAIR RESPICLICK) 108 (90 Base) MCG/ACT AEPB Inhale 1 puff into the lungs daily as needed (for shortness of breath).    [provider]  Ascorbic Acid (VITAMIN C) 1000 MG tablet TAKE 1 TABLET BY MOUTH EVERY DAY Patient taking differently: Take 1,000 mg by mouth daily. 09/28/21   Arnette Felts, FNP  baclofen (LIORESAL) 20 MG tablet TAKE 1 TABLET BY MOUTH 3  TIMES DAILY 05/17/22   Glean Salvo, NP  cetirizine (ZYRTEC ALLERGY) 10 MG tablet Take 1 tablet (10 mg total) by mouth daily. 04/21/22 04/21/23  Arnette Felts, FNP  Cholecalciferol (VITAMIN D3) 10 MCG (400 UNIT) tablet Take 400 Units by mouth daily.    [provider]  Cranberry-Vitamin C-Vitamin E 4200-20-3  MG-MG-UNIT CAPS Take 1 tablet by mouth daily.    [provider]  desloratadine (CLARINEX) 5 MG tablet TAKE 1 TABLET BY MOUTH DAILY Patient taking differently: Take 5 mg by mouth daily.  07/17/22   Arnette Felts, FNP  diazepam (VALIUM) 10 MG tablet TAKE 2 TABLETS BY MOUTH IN THE  MORNING, AT NOON, AND AT BEDTIME Patient taking differently: Take 10 mg by mouth 3 (three) times daily. 08/07/22   Penumalli, Vikram R, MD  EPINEPHRINE 0.3 mg/0.3 mL IJ SOAJ injection INJECT INTRAMUSCULARLY 1 PEN AS  NEEDED FOR ALLERGIC RESPONSE AS  DIRECTED BY MD. Assunta Found MEDICAL  HELP AFTER USE. 03/30/22   Arnette Felts, FNP  famotidine (PEPCID) 20 MG tablet TAKE 1 TABLET BY MOUTH  TWICE DAILY Patient taking differently: Take 20 mg by mouth 2 (two) times daily. 04/14/22   Arnette Felts, FNP  FLUoxetine (PROZAC) 10 MG capsule TAKE 1 CAPSULE BY MOUTH DAILY Patient taking differently: Take 10 mg by mouth daily. 03/15/22   Glean Salvo, NP  fluticasone Aleda Grana) 50 MCG/ACT nasal spray Place 2 sprays into both nostrils daily. 11/29/21   Bing Neighbors, FNP  gabapentin (NEURONTIN) 300 MG capsule TAKE 1 CAPSULE BY MOUTH TWICE  DAILY Patient taking differently: Take 300 mg by mouth 2 (two) times daily. 04/17/22   Glean Salvo, NP  Glucagon (GVOKE HYPOPEN 2-PACK) 0.5 MG/0.1ML SOAJ Inject 0.1 mLs into the skin as needed (if blood sugar less than 70). Patient not taking: Reported on 08/17/2022 07/19/22   Arnette Felts, FNP  hydrOXYzine (VISTARIL) 100 MG capsule TAKE 1 CAPSULE BY MOUTH 3  TIMES DAILY AS NEEDED FOR  ITCHING Patient taking differently: Take 100 mg by mouth 3 (three) times daily as needed for itching. 03/01/21   Arnette Felts, FNP  insulin aspart (NOVOLOG FLEXPEN) 100 UNIT/ML FlexPen Max daily 30 units 11/09/21   [name], [name], MD  insulin degludec (TRESIBA) 100 UNIT/ML FlexTouch Pen Inject 10 Units into the skin daily. 03/07/22   [name], [name], MD  Insulin Pen Needle 32G X 4 MM MISC 1 each  by Does not apply route 2 (two) times daily. Dx E11.9 05/27/21   Arnette Felts, FNP  vitamin E 180 MG (400 UNITS) capsule Take 400 Units by mouth daily.     [provider]     Critical care time: 45 minutes    Melody Comas, MD [facility] Office: [phone]   See Amion for personal pager PCCM on call pager [phone] until 7pm. Please call Elink 7p-7a. [phone]

## 2022-09-28 NOTE — ED Provider Notes (Signed)
[facility]-EMERGENCY DEPT Provider Note   CSN: [id number] Arrival date & time: 09/28/22  1510     History  Chief Complaint  Patient presents with   Hyperglycemia    Sherri [name] is a 56 y.o. female.  Level 5 caveat for altered mental status.  Patient brought in by EMS with hyperglycemia and "lethargy".  She apparently called EMS herself complaining of elevated blood sugar.  Does have a history of diabetes and takes [place].  EMS reports she was confused with blood sugar greater than 600.  On arrival her blood sugar is 463.  She is somnolent and slow to respond.  Not able to give much of a history.  Reports falling last year and breaking her glasses which she is still wearing.  Also reports a fall several days ago under unclear circumstances.  Complains of diffuse abdominal pain and nausea.  No vomiting.  No chest pain or shortness of breath.  No known fever.  The history is provided by the patient and the EMS personnel. The history is limited by the condition of the patient.  Hyperglycemia      Home Medications Prior to Admission medications   Medication Sig Start Date End Date Taking? Authorizing Provider  ACCU-CHEK SMARTVIEW test strip USE AS INSTRUCTED TO CHECK SUGARS BEFORE MEALS AND AT BEDTIME 07/18/22   [name], [name], MD  Albuterol Sulfate (PROAIR RESPICLICK) 108 (90 Base) MCG/ACT AEPB Inhale into the lungs.    [provider]  Ascorbic Acid (VITAMIN C) 1000 MG tablet TAKE 1 TABLET BY MOUTH EVERY DAY 09/28/21   Arnette Felts, FNP  baclofen (LIORESAL) 20 MG tablet TAKE 1 TABLET BY MOUTH 3  TIMES DAILY 05/17/22   Glean Salvo, NP  cetirizine (ZYRTEC ALLERGY) 10 MG tablet Take 1 tablet (10 mg total) by mouth daily. 04/21/22 04/21/23  Arnette Felts, FNP  Cholecalciferol (VITAMIN D3) 10 MCG (400 UNIT) tablet Take 400 Units by mouth daily.    [provider]  Cranberry-Vitamin C-Vitamin E 4200-20-3 MG-MG-UNIT CAPS Take 1 tablet by  mouth daily.    [provider]  desloratadine (CLARINEX) 5 MG tablet TAKE 1 TABLET BY MOUTH DAILY 07/17/22   Arnette Felts, FNP  diazepam (VALIUM) 10 MG tablet TAKE 2 TABLETS BY MOUTH IN THE  MORNING, AT NOON, AND AT BEDTIME 08/07/22   Penumalli, Vikram R, MD  EPINEPHRINE 0.3 mg/0.3 mL IJ SOAJ injection INJECT INTRAMUSCULARLY 1 PEN AS  NEEDED FOR ALLERGIC RESPONSE AS  DIRECTED BY MD. Assunta Found MEDICAL  HELP AFTER USE. 03/30/22   Arnette Felts, FNP  famotidine (PEPCID) 20 MG tablet TAKE 1 TABLET BY MOUTH  TWICE DAILY 04/14/22   Arnette Felts, FNP  FLUoxetine (PROZAC) 10 MG capsule TAKE 1 CAPSULE BY MOUTH DAILY 03/15/22   Glean Salvo, NP  fluticasone (FLONASE) 50 MCG/ACT nasal spray Place 2 sprays into both nostrils daily. 11/29/21   Bing Neighbors, FNP  gabapentin (NEURONTIN) 300 MG capsule TAKE 1 CAPSULE BY MOUTH TWICE  DAILY 04/17/22   Glean Salvo, NP  Glucagon (GVOKE HYPOPEN 2-PACK) 0.5 MG/0.1ML SOAJ Inject 0.1 mLs into the skin as needed (if blood sugar less than 70). Patient not taking: Reported on 08/17/2022 07/19/22   Arnette Felts, FNP  hydrOXYzine (VISTARIL) 100 MG capsule TAKE 1 CAPSULE BY MOUTH 3  TIMES DAILY AS NEEDED FOR  ITCHING Patient taking differently: Take 100 mg by mouth 3 (three) times daily as needed for itching. 03/01/21   Arnette Felts, FNP  insulin aspart (NOVOLOG FLEXPEN) 100 UNIT/ML FlexPen Max daily 30 units 11/09/21   [name], Melanie Crazier, MD  insulin degludec (TRESIBA) 100 UNIT/ML FlexTouch Pen Inject 10 Units into the skin daily. 03/07/22   [name], Melanie Crazier, MD  Insulin Pen Needle 32G X 4 MM MISC 1 each by Does not apply route 2 (two) times daily. Dx E11.9 05/27/21   Minette Brine, FNP  vitamin E 180 MG (400 UNITS) capsule Take 400 Units by mouth daily.     [provider]      Allergies    Ibuprofen, Lemon flavor, Amoxicillin, Tylenol [acetaminophen], Dust mite extract, and Molds & smuts    Review of Systems   Review of Systems  Unable  to perform ROS: Mental status change    Physical Exam Updated Vital Signs BP 111/87   Pulse (!) 116   Resp (!) 21   LMP 07/29/2012 (Approximate)   SpO2 100%  Physical Exam Vitals and nursing note reviewed.  Constitutional:      General: She is not in acute distress.    Appearance: She is well-developed. She is ill-appearing.     Comments: Somnolent, responds to pain, mumbles a few words  HENT:     Head: Normocephalic and atraumatic.     Mouth/Throat:     Mouth: Mucous membranes are dry.     Pharynx: No oropharyngeal exudate.  Eyes:     Conjunctiva/sclera: Conjunctivae normal.     Pupils: Pupils are equal, round, and reactive to light.  Neck:     Comments: No meningismus. Cardiovascular:     Rate and Rhythm: Regular rhythm. Tachycardia present.     Heart sounds: Normal heart sounds. No murmur heard. Pulmonary:     Effort: Pulmonary effort is normal. No respiratory distress.     Breath sounds: Normal breath sounds.  Abdominal:     Palpations: Abdomen is soft.     Tenderness: There is abdominal tenderness. There is no guarding or rebound.     Comments: Diffuse tenderness, no guarding or rebound  Musculoskeletal:        General: No tenderness. Normal range of motion.     Cervical back: Normal range of motion and neck supple.  Skin:    General: Skin is warm.  Neurological:     Mental Status: She is alert.     Cranial Nerves: No cranial nerve deficit.     Motor: No abnormal muscle tone.     Coordination: Coordination normal.     Comments: Slow to respond, no obvious facial droop, 5/5 strength throughout, tongue is midline.  Psychiatric:        Behavior: Behavior normal.     ED Results / Procedures / Treatments   Labs (all labs ordered are listed, but only abnormal results are displayed) Labs Reviewed  BASIC METABOLIC PANEL - Abnormal; Notable for the following components:      Result Value   Sodium 134 (*)    Potassium 5.4 (*)    CO2 <7 (*)    Glucose, Bld 529  (*)    BUN 42 (*)    Creatinine, Ser 2.10 (*)    GFR, Estimated 27 (*)    All other components within normal limits  BETA-HYDROXYBUTYRIC ACID - Abnormal; Notable for the following components:   Beta-Hydroxybutyric Acid >8.00 (*)    All other components within normal limits  CBC WITH DIFFERENTIAL/PLATELET - Abnormal; Notable for the following components:   WBC 11.8 (*)    RBC 5.23 (*)  HCT 48.3 (*)    Neutro Abs 10.6 (*)    All other components within normal limits  URINALYSIS, ROUTINE W REFLEX MICROSCOPIC - Abnormal; Notable for the following components:   Color, Urine STRAW (*)    Glucose, UA >=500 (*)    Hgb urine dipstick MODERATE (*)    Ketones, ur 80 (*)    Protein, ur 30 (*)    Bacteria, UA RARE (*)    All other components within normal limits  BLOOD GAS, VENOUS - Abnormal; Notable for the following components:   pH, Ven 6.97 (*)    pCO2, Ven 29 (*)    pO2, Ven 82 (*)    Bicarbonate 6.7 (*)    Acid-base deficit 24.1 (*)    All other components within normal limits  HEPATIC FUNCTION PANEL - Abnormal; Notable for the following components:   Alkaline Phosphatase 127 (*)    Total Bilirubin 1.5 (*)    Indirect Bilirubin 1.3 (*)    All other components within normal limits  CBG MONITORING, ED - Abnormal; Notable for the following components:   Glucose-Capillary 493 (*)    All other components within normal limits  CBG MONITORING, ED - Abnormal; Notable for the following components:   Glucose-Capillary 442 (*)    All other components within normal limits  CBG MONITORING, ED - Abnormal; Notable for the following components:   Glucose-Capillary 454 (*)    All other components within normal limits  LIPASE, BLOOD  CK  BASIC METABOLIC PANEL  BASIC METABOLIC PANEL  BASIC METABOLIC PANEL  BETA-HYDROXYBUTYRIC ACID  PROCALCITONIN  PROCALCITONIN  MAGNESIUM  HIV ANTIBODY (ROUTINE TESTING W REFLEX)  CBC  CREATININE, SERUM  CBC  MAGNESIUM  PHOSPHORUS  LIPASE, BLOOD   I-STAT BETA HCG BLOOD, ED ([facility], [facility], [facility] ONLY)  TROPONIN I (HIGH SENSITIVITY)  TROPONIN I (HIGH SENSITIVITY)    EKG EKG Interpretation  Date/Time:  Thursday September 28 2022 16:08:07 EDT Ventricular Rate:  112 PR Interval:  134 QRS Duration: 85 QT Interval:  333 QTC Calculation: 455 R Axis:   19 Text Interpretation: Sinus tachycardia Biatrial enlargement Probable left ventricular hypertrophy Abnrm T, consider ischemia, anterolateral lds rate faster Confirmed by Glynn Octave [id number]) on 09/28/2022 4:30:28 PM  Radiology CT Head Wo Contrast  Result Date: 09/28/2022 CLINICAL DATA:  Altered mental status. EXAM: CT HEAD WITHOUT CONTRAST TECHNIQUE: Contiguous axial images were obtained from the base of the skull through the vertex without intravenous contrast. RADIATION DOSE REDUCTION: This exam was performed according to the departmental dose-optimization program which includes automated exposure control, adjustment of the mA and/or kV according to patient size and/or use of iterative reconstruction technique. COMPARISON:  August 17, 2022. FINDINGS: Brain: No evidence of acute infarction, hemorrhage, hydrocephalus, extra-axial collection or mass lesion/mass effect. Vascular: No hyperdense vessel or unexpected calcification. Skull: Normal. Negative for fracture or focal lesion. Sinuses/Orbits: No acute finding. Other: None. IMPRESSION: No acute intracranial abnormality seen. Electronically Signed   By: Lupita Raider M.D.   On: 09/28/2022 16:30   DG Chest Portable 1 View  Result Date: 09/28/2022 CLINICAL DATA:  Altered mental status. EXAM: PORTABLE CHEST 1 VIEW COMPARISON:  September 08, 2021. FINDINGS: The heart size and mediastinal contours are within normal limits. Both lungs are clear. Large hiatal hernia is noted. The visualized skeletal structures are unremarkable. IMPRESSION: Large hiatal hernia.  No other abnormality seen. Electronically Signed   By: Lupita Raider M.D.   On: 09/28/2022  16:26  Procedures .Critical Care  Performed by: Glynn Octave, MD Authorized by: Glynn Octave, MD   Critical care provider statement:    Critical care time (minutes):  45   Critical care time was exclusive of:  Separately billable procedures and treating other patients   Critical care was necessary to treat or prevent imminent or life-threatening deterioration of the following conditions:  Dehydration, endocrine crisis and metabolic crisis   Critical care was time spent personally by me on the following activities:  Development of treatment plan with patient or surrogate, discussions with consultants, evaluation of patient's response to treatment, examination of patient, ordering and review of laboratory studies, ordering and review of radiographic studies, ordering and performing treatments and interventions, pulse oximetry, re-evaluation of patient's condition and review of old charts   I assumed direction of critical care for this patient from another provider in my specialty: no     Care discussed with: admitting provider       Medications Ordered in ED Medications  lactated ringers bolus 20 mL/kg (has no administration in time range)  lactated ringers infusion (has no administration in time range)  dextrose 5 % in lactated ringers infusion (has no administration in time range)  dextrose 50 % solution 0-50 mL (has no administration in time range)  lactated ringers bolus 1,000 mL (has no administration in time range)    ED Course/ Medical Decision Making/ A&P                           Medical Decision Making Amount and/or Complexity of Data Reviewed Independent Historian: EMS Labs: ordered. Decision-making details documented in ED Course. Radiology: ordered and independent interpretation performed. Decision-making details documented in ED Course. ECG/medicine tests: ordered and independent interpretation performed. Decision-making details documented in ED  Course.  Risk Prescription drug management. Decision regarding hospitalization.  Hyperglycemia with altered mental status and concern for DKA.  Dry on arrival.  Will initiate IV fluids, labs, IV insulin after checking electrolytes.  We will also obtain CT head given her altered mental status and possible fall.  IV fluids initiated.  Work-up is consistent with DKA.  Has pH of 6.9, bicarb of 7, elevated anion gap.  Potassium is 5.4.  Patient initiated on IV fluids and IV insulin per protocol.  CT head is negative for acute traumatic pathology.  Results reviewed and interpreted by me.  Chest x-ray is negative.  No evidence of infection.  Patient is improving after IV fluids and insulin.  She is protecting her airway.  No indication for intubation.  Labs consistent with diabetic ketoacidosis with bicarb less than 7, pH 6.97. Does have AKI as well.  Continue aggressive IV hydration with IV insulin.  No obvious infectious source.  No fever.  Urinalysis shows large ketones without gross infection  Patient discussed with Dr. Francine Graven of critical care.         Final Clinical Impression(s) / ED Diagnoses Final diagnoses:  Diabetic ketoacidosis with coma associated with type 2 diabetes mellitus [facility])    Rx / DC Orders ED Discharge Orders     None         Tanis Burnley, Jeannett Senior, MD 09/28/22 [phone]

## 2022-09-28 NOTE — ED Notes (Signed)
Patient transported to CT 

## 2022-09-29 ENCOUNTER — Other Ambulatory Visit: Payer: Self-pay

## 2022-09-29 DIAGNOSIS — N183 Chronic kidney disease, stage 3 unspecified: Secondary | ICD-10-CM

## 2022-09-29 DIAGNOSIS — N179 Acute kidney failure, unspecified: Secondary | ICD-10-CM

## 2022-09-29 DIAGNOSIS — E0810 Diabetes mellitus due to underlying condition with ketoacidosis without coma: Secondary | ICD-10-CM | POA: Diagnosis not present

## 2022-09-29 LAB — GLUCOSE, CAPILLARY
Glucose-Capillary: 134 mg/dL — ABNORMAL HIGH (ref 70–99)
Glucose-Capillary: 176 mg/dL — ABNORMAL HIGH (ref 70–99)
Glucose-Capillary: 180 mg/dL — ABNORMAL HIGH (ref 70–99)
Glucose-Capillary: 180 mg/dL — ABNORMAL HIGH (ref 70–99)
Glucose-Capillary: 194 mg/dL — ABNORMAL HIGH (ref 70–99)
Glucose-Capillary: 202 mg/dL — ABNORMAL HIGH (ref 70–99)
Glucose-Capillary: 203 mg/dL — ABNORMAL HIGH (ref 70–99)
Glucose-Capillary: 203 mg/dL — ABNORMAL HIGH (ref 70–99)
Glucose-Capillary: 237 mg/dL — ABNORMAL HIGH (ref 70–99)
Glucose-Capillary: 243 mg/dL — ABNORMAL HIGH (ref 70–99)
Glucose-Capillary: 269 mg/dL — ABNORMAL HIGH (ref 70–99)
Glucose-Capillary: 304 mg/dL — ABNORMAL HIGH (ref 70–99)

## 2022-09-29 LAB — CBC
HCT: 34.4 % — ABNORMAL LOW (ref 36.0–46.0)
Hemoglobin: 11.7 g/dL — ABNORMAL LOW (ref 12.0–15.0)
MCH: 28.7 pg (ref 26.0–34.0)
MCHC: 34 g/dL (ref 30.0–36.0)
MCV: 84.5 fL (ref 80.0–100.0)
Platelets: 192 10*3/uL (ref 150–400)
RBC: 4.07 MIL/uL (ref 3.87–5.11)
RDW: 12.5 % (ref 11.5–15.5)
WBC: 9.7 10*3/uL (ref 4.0–10.5)
nRBC: 0 % (ref 0.0–0.2)

## 2022-09-29 LAB — BASIC METABOLIC PANEL
Anion gap: 13 (ref 5–15)
Anion gap: 6 (ref 5–15)
Anion gap: 6 (ref 5–15)
Anion gap: 6 (ref 5–15)
BUN: 26 mg/dL — ABNORMAL HIGH (ref 6–20)
BUN: 31 mg/dL — ABNORMAL HIGH (ref 6–20)
BUN: 33 mg/dL — ABNORMAL HIGH (ref 6–20)
BUN: 36 mg/dL — ABNORMAL HIGH (ref 6–20)
CO2: 16 mmol/L — ABNORMAL LOW (ref 22–32)
CO2: 21 mmol/L — ABNORMAL LOW (ref 22–32)
CO2: 21 mmol/L — ABNORMAL LOW (ref 22–32)
CO2: 22 mmol/L (ref 22–32)
Calcium: 8.5 mg/dL — ABNORMAL LOW (ref 8.9–10.3)
Calcium: 9.3 mg/dL (ref 8.9–10.3)
Calcium: 9.4 mg/dL (ref 8.9–10.3)
Calcium: 9.8 mg/dL (ref 8.9–10.3)
Chloride: 105 mmol/L (ref 98–111)
Chloride: 110 mmol/L (ref 98–111)
Chloride: 111 mmol/L (ref 98–111)
Chloride: 112 mmol/L — ABNORMAL HIGH (ref 98–111)
Creatinine, Ser: 1.13 mg/dL — ABNORMAL HIGH (ref 0.44–1.00)
Creatinine, Ser: 1.24 mg/dL — ABNORMAL HIGH (ref 0.44–1.00)
Creatinine, Ser: 1.25 mg/dL — ABNORMAL HIGH (ref 0.44–1.00)
Creatinine, Ser: 1.52 mg/dL — ABNORMAL HIGH (ref 0.44–1.00)
GFR, Estimated: 40 mL/min — ABNORMAL LOW (ref >60)
GFR, Estimated: 50 mL/min — ABNORMAL LOW (ref >60)
GFR, Estimated: 50 mL/min — ABNORMAL LOW (ref >60)
GFR, Estimated: 56 mL/min — ABNORMAL LOW (ref >60)
Glucose, Bld: 177 mg/dL — ABNORMAL HIGH (ref 70–99)
Glucose, Bld: 208 mg/dL — ABNORMAL HIGH (ref 70–99)
Glucose, Bld: 271 mg/dL — ABNORMAL HIGH (ref 70–99)
Glucose, Bld: 363 mg/dL — ABNORMAL HIGH (ref 70–99)
Potassium: 3.5 mmol/L (ref 3.5–5.1)
Potassium: 3.5 mmol/L (ref 3.5–5.1)
Potassium: 4.3 mmol/L (ref 3.5–5.1)
Potassium: 4.7 mmol/L (ref 3.5–5.1)
Sodium: 133 mmol/L — ABNORMAL LOW (ref 135–145)
Sodium: 138 mmol/L (ref 135–145)
Sodium: 139 mmol/L (ref 135–145)
Sodium: 139 mmol/L (ref 135–145)

## 2022-09-29 LAB — HIV ANTIBODY (ROUTINE TESTING W REFLEX): HIV Screen 4th Generation wRfx: NONREACTIVE

## 2022-09-29 LAB — MRSA NEXT GEN BY PCR, NASAL: MRSA by PCR Next Gen: NOT DETECTED

## 2022-09-29 LAB — PHOSPHORUS
Phosphorus: <1 mg/dL — CL (ref 2.5–4.6)
Phosphorus: 2.4 mg/dL — ABNORMAL LOW (ref 2.5–4.6)

## 2022-09-29 LAB — BETA-HYDROXYBUTYRIC ACID
Beta-Hydroxybutyric Acid: 6.38 mmol/L — ABNORMAL HIGH (ref 0.05–0.27)
Beta-Hydroxybutyric Acid: 0.8 mmol/L — ABNORMAL HIGH (ref 0.05–0.27)

## 2022-09-29 LAB — PROCALCITONIN: Procalcitonin: 0.45 ng/mL

## 2022-09-29 LAB — MAGNESIUM: Magnesium: 2.1 mg/dL (ref 1.7–2.4)

## 2022-09-29 MED ORDER — POTASSIUM CHLORIDE 20 MEQ PO PACK: 60.0000 meq | PACK | Freq: Once | ORAL | Status: DC

## 2022-09-29 MED ORDER — INSULIN ASPART 100 UNIT/ML IJ SOLN
0.0000 [IU] | Freq: Three times a day (TID) | INTRAMUSCULAR | Status: DC
  Administered 2022-09-29: 3 [IU] via SUBCUTANEOUS
  Administered 2022-09-29: 7 [IU] via SUBCUTANEOUS
  Administered 2022-09-30: 9 [IU] via SUBCUTANEOUS
  Administered 2022-09-30 – 2022-10-01 (×2): 5 [IU] via SUBCUTANEOUS
  Administered 2022-10-01: 7 [IU] via SUBCUTANEOUS
  Administered 2022-10-02: 9 [IU] via SUBCUTANEOUS
  Administered 2022-10-02: 3 [IU] via SUBCUTANEOUS
  Administered 2022-10-02: 9 [IU] via SUBCUTANEOUS
  Administered 2022-10-03: 5 [IU] via SUBCUTANEOUS
  Administered 2022-10-03: 1 [IU] via SUBCUTANEOUS

## 2022-09-29 MED ORDER — BACLOFEN 10 MG PO TABS
10.0000 mg | ORAL_TABLET | Freq: Three times a day (TID) | ORAL | Status: DC
  Administered 2022-09-29 (×3): 10 mg via ORAL
  Filled 2022-09-29 (×2): qty 1

## 2022-09-29 MED ORDER — INSULIN ASPART PROT & ASPART (70-30 MIX) 100 UNIT/ML ~~LOC~~ SUSP
8.0000 [IU] | Freq: Two times a day (BID) | SUBCUTANEOUS | Status: DC
  Administered 2022-09-30: 8 [IU] via SUBCUTANEOUS
  Filled 2022-09-29: qty 10

## 2022-09-29 MED ORDER — INSULIN GLARGINE-YFGN 100 UNIT/ML ~~LOC~~ SOLN
10.0000 [IU] | Freq: Every day | SUBCUTANEOUS | Status: DC
  Administered 2022-09-29: 10 [IU] via SUBCUTANEOUS
  Filled 2022-09-29: qty 0.1

## 2022-09-29 MED ORDER — LACTATED RINGERS IV BOLUS
1000.0000 mL | Freq: Once | INTRAVENOUS | Status: AC
  Administered 2022-09-29: 500 mL via INTRAVENOUS

## 2022-09-29 MED ORDER — ORAL CARE MOUTH RINSE: 15.0000 mL | OROMUCOSAL | Status: DC | PRN

## 2022-09-29 MED ORDER — POTASSIUM CHLORIDE CRYS ER 20 MEQ PO TBCR
60.0000 meq | EXTENDED_RELEASE_TABLET | Freq: Once | ORAL | Status: AC
  Administered 2022-09-29: 60 meq via ORAL
  Filled 2022-09-29: qty 3

## 2022-09-29 MED ORDER — INSULIN ASPART 100 UNIT/ML IJ SOLN
0.0000 [IU] | Freq: Every day | INTRAMUSCULAR | Status: DC
  Administered 2022-09-29: 3 [IU] via SUBCUTANEOUS
  Administered 2022-09-30: 2 [IU] via SUBCUTANEOUS

## 2022-09-29 MED ORDER — POTASSIUM PHOSPHATES 15 MMOLE/5ML IV SOLN
30.0000 mmol | Freq: Once | INTRAVENOUS | Status: AC
  Administered 2022-09-29: 30 mmol via INTRAVENOUS
  Filled 2022-09-29: qty 10

## 2022-09-29 MED ORDER — CHLORHEXIDINE GLUCONATE CLOTH 2 % EX PADS
6.0000 | MEDICATED_PAD | Freq: Every day | CUTANEOUS | Status: DC
  Administered 2022-09-28 – 2022-09-30 (×3): 6 via TOPICAL

## 2022-09-29 NOTE — Inpatient Diabetes Management (Addendum)
Inpatient Diabetes Program Recommendations  AACE/ADA: New Consensus Statement on Inpatient Glycemic Control (2015)  Target Ranges:  Prepandial:   less than 140 mg/dL      Peak postprandial:   less than 180 mg/dL (1-2 hours)      Critically ill patients:  140 - 180 mg/dL   Lab Results  Component Value Date   GLUCAP 203 (H) 09/29/2022   HGBA1C 12.2 (A) 07/07/2022    Review of Glycemic Control  Latest Reference Range & Units 09/29/22 05:38 09/29/22 06:45 09/29/22 07:49  Glucose-Capillary 70 - 99 mg/dL 134 (H) 180 (H) 203 (H)  (H): Data is abnormally high Diabetes history: Type 1 Dm Outpatient Diabetes medications: Tresiba 10 units QD, Novolog 6-13 units TID Current orders for Inpatient glycemic control: IV insulin  Inpatient Diabetes Program Recommendations:    When ready to transition consider: -Semglee 10 units two hours prior to discontinuation of IV insulin, then QD to follow -Novolog 0-9 units TID & HS - Novolog 3 units TID (assuming patient consuming >50% of meals).   Addendum: Spoke with patient regarding outpatient diabetes management. Patient is followed by Dr Kelton Pillar, outpatient endocrinology and has been trying obtain basal insulin via free samples. However, the patient feels bad for asking and usually tries to stretch out doses.  Reviewed patient's current A1c of 12.2%. Explained what a A1c is and what it measures. Also reviewed goal A1c with patient, importance of good glucose control @ home, and blood sugar goals. Reviewed patho of DM, survival skills, interventions, role of pancreas, differences between [place] and 70/30, vascular changes, DKA and other commorbidities. Patient has a meter and testing supplies. Understands when to contact MD.  Discussed in detail Relion 70/30. Patient is used to spending >$100 per month for her short acting insulin. Feels that 70/30 would be more accessible for her. Reviewed when to take, need to admin with meal and sick day rules.  Patient to follow up with [name].  Consider changing to Novolog 70/30 8 units BID.   Thanks, Bronson Curb, MSN, RNC-OB Diabetes Coordinator [phone] (8a-5p)    Thanks, Bronson Curb, MSN, RNC-OB Diabetes Coordinator [phone] (8a-5p)

## 2022-09-29 NOTE — Progress Notes (Signed)
eLink Physician-Brief Progress Note Patient Name: Sherri [name] DOB: 05-20-1966 MRN: [id number]   Date of Service  09/29/2022  HPI/Events of Note  Hypokalemia  Hypophosphatemia - K+ = 3.5, PO4--- = < 1.0 and Creatinine = 1.24.   eICU Interventions  Plan: Will replace K+ and PO4---. Repeat BMP and phosphorus level at 2 PM.      Intervention Category Major Interventions: Electrolyte abnormality - evaluation and management  Jariah Jarmon Eugene 09/29/2022, 5:51 AM

## 2022-09-29 NOTE — Progress Notes (Signed)
NAME:  Sherri [name], MRN:  [id number], DOB:  07/21/1966, LOS: 1 ADMISSION DATE:  09/28/2022, CONSULTATION DATE:  09/28/22 REFERRING MD: Wyvonnia Dusky, MD  CHIEF COMPLAINT:  DKA   History of Present Illness:  Sherri [name] is a 56 year old woman with history of DMI, stiff person syndrome, hiatal hernia, asthma and anemia who called EMS today for feeling lethargic and having high blood sugars. At time of arrival to ER she has altered mentation. She was found to be in DKA with glucose of 529, VBG pH 6.97, serum bicarb <7 and Cr 2.1. Beta hydroxybutyrate is pending. Head CT was unremarkable along with chest x-ray. She denies any symptoms of infection. She reports running out of tresiba recently and received a new sample from her Endocrinology clinic but did not use it for 3 days.   She has been given 2L of LR in the ER. Her mentation has improved.   PCCM called for admission.   Pertinent  Medical History   Past Medical History:  Diagnosis Date   Angioedema    Anxiety    Arthritis    Asthma    Benzodiazepine withdrawal ([place]) 08/30/2012   CAP (community acquired pneumonia) 08/29/2012   Chronic back pain    Chronic kidney disease    Closed nondisplaced fracture of proximal phalanx of right little finger 10/18/2018   Cocaine abuse ([place])    Depression    Diarrhea    DVT (deep venous thrombosis) (HCC)    Environmental allergies    Fall    GERD (gastroesophageal reflux disease)    Heart murmur    has been told once that she has a heart murmur, but has never had any problems   Hiatal hernia 03/21/2013   Lumbar herniated disc    Peripheral vascular disease (HCC)    Pernicious anemia 10/30/2011   Postconcussion syndrome 12/30/2014   Rhabdomyolysis 08/27/2012   Seasonal allergies    Stiff person syndrome    Urticaria      Significant Hospital Events: Including procedures, antibiotic start and stop dates in addition to other pertinent events   10/5 admit to step-down unit for DKA 10/6 plan to  transition off of gtt   Interim History / Subjective:  Pending AM labs Remains on low rate insulin gtt   Objective   Blood pressure 128/72, pulse 89, temperature 98.2 F (36.8 C), resp. rate 20, height 5\' 3"  (1.6 m), weight 40.9 kg, last menstrual period 07/29/2012, SpO2 98 %.        Intake/Output Summary (Last 24 hours) at 09/29/2022 0912 Last data filed at 09/29/2022 9937 Gross per 24 hour  Intake 4903.87 ml  Output --  Net 4903.87 ml   Filed Weights   09/28/22 1535 09/28/22 1910 09/29/22 0359  Weight: 49 kg 40.9 kg 40.9 kg   Examination: General: ill middle aged F  HENT: NCAT pink mm, dry  Lungs: even unlabored on RA  Cardiovascular: rrr  Abdomen: soft ndnt  Extremities: no acute joint deformity. Decreased muscle mass, symmetrical  Neuro: Lethargic. No focal deficit  GU: wnl   Resolved Hospital Problem list   Leukocytosis   Assessment & Plan:   DKA DM1  Dehydration in setting of DKA -etiology seems to be running out of long acting P - add'l 1L LR bolus -mIVF and insulin gtt as per endotool - awaiting AM labs -- includes repeat beta hydroxybutyrate and AG  -hopefully transition off of insulin gtt this morning  -DM coordinator to see  AKI on CKD III- improving AKI AGMA  -Renal US with renal atrophy, mild R sided hydronephrosis P - Fluids as above  Stiff Person Syndrome  P -baclofen -- at a reduced dose with improving AKI - pending home med rec re fluoxetine, valium, hydroxyzine when able   Best Practice (right click and "Reselect all SmartList Selections" daily)   Diet/type: NPO w/ oral meds DVT prophylaxis: prophylactic heparin  GI prophylaxis: N/A Lines: N/A Foley:  N/A Code Status:  full code Last date of multidisciplinary goals of care discussion [10/6 d/w pt] Dispo: stable for [facility] to take over care 10/7   Labs   CBC: Recent Labs  Lab 09/28/22 1527 09/29/22 0435  WBC 11.8* 9.7  NEUTROABS 10.6*  --   HGB 14.7 11.7*  HCT 48.3* 34.4*   MCV 92.4 84.5  PLT 245 [id number]    Basic Metabolic Panel: Recent Labs  Lab 09/28/22 1527 09/28/22 2055 09/29/22 0000 09/29/22 0435 09/29/22 0726  NA 134* 138 139 139 138  K 5.4* 3.5 4.7 3.5 3.5  CL 101 110 110 112* 111  CO2 <7* 14* 16* 21* 21*  GLUCOSE 529* 294* 271* 177* 208*  BUN 42* 38* 36* 33* 31*  CREATININE 2.10* 1.86* 1.52* 1.24* 1.25*  CALCIUM 10.2 9.4 9.8 9.4 9.3  MG  --  2.4  --  2.1  --   PHOS  --   --   --  <1.0*  --    GFR: Estimated Creatinine Clearance: 31.7 mL/min (A) (by C-G formula based on SCr of 1.25 mg/dL (H)). Recent Labs  Lab 09/28/22 1527 09/28/22 1641 09/29/22 0435  PROCALCITON  --  0.22 0.45  WBC 11.8*  --  9.7    Liver Function Tests: Recent Labs  Lab 09/28/22 1641  AST 17  ALT 30  ALKPHOS 127*  BILITOT 1.5*  PROT 7.4  ALBUMIN 4.1   Recent Labs  Lab 09/28/22 1527 09/28/22 1641  LIPASE 29 28   No results for input(s): "AMMONIA" in the last 168 hours.  ABG    Component Value Date/Time   HCO3 6.7 (L) 09/28/2022 1601   TCO2 28 03/09/2022 1656   ACIDBASEDEF 24.1 (H) 09/28/2022 1601   O2SAT NOT CALCULATED 09/28/2022 1601     Coagulation Profile: No results for input(s): "INR", "PROTIME" in the last 168 hours.  Cardiac Enzymes: Recent Labs  Lab 09/28/22 1641  CKTOTAL 81    HbA1C: Hemoglobin A1C  Date/Time Value Ref Range Status  07/07/2022 02:24 PM 12.2 (A) 4.0 - 5.6 % Final  03/07/2022 02:51 PM 11.3 (A) 4.0 - 5.6 % Final   Hgb A1c MFr Bld  Date/Time Value Ref Range Status  09/08/2021 03:30 PM 12.5 (H) 4.8 - 5.6 % Final    Comment:    (NOTE) Pre diabetes:          5.7%-6.4%  Diabetes:              >6.4%  Glycemic control for   <7.0% adults with diabetes   05/06/2021 05:41 AM 13.0 (H) 4.8 - 5.6 % Final    Comment:    (NOTE) Pre diabetes:          5.7%-6.4%  Diabetes:              >6.4%  Glycemic control for   <7.0% adults with diabetes     CBG: Recent Labs  Lab 09/29/22 0400 09/29/22 0538  09/29/22 0645 09/29/22 0749 09/29/22 0905  GLUCAP 176* 134* 180* 203*  180*   CCT: n/a  Eliseo Gum MSN, AGACNP-BC [place] for pager  09/29/2022, 9:12 AM

## 2022-09-30 DIAGNOSIS — E0810 Diabetes mellitus due to underlying condition with ketoacidosis without coma: Secondary | ICD-10-CM | POA: Diagnosis not present

## 2022-09-30 LAB — BASIC METABOLIC PANEL
Anion gap: 6 (ref 5–15)
BUN: 19 mg/dL (ref 6–20)
CO2: 23 mmol/L (ref 22–32)
Calcium: 8.9 mg/dL (ref 8.9–10.3)
Chloride: 107 mmol/L (ref 98–111)
Creatinine, Ser: 1.07 mg/dL — ABNORMAL HIGH (ref 0.44–1.00)
GFR, Estimated: >60 mL/min (ref >60)
Glucose, Bld: 301 mg/dL — ABNORMAL HIGH (ref 70–99)
Potassium: 3.8 mmol/L (ref 3.5–5.1)
Sodium: 136 mmol/L (ref 135–145)

## 2022-09-30 LAB — GLUCOSE, CAPILLARY
Glucose-Capillary: 281 mg/dL — ABNORMAL HIGH (ref 70–99)
Glucose-Capillary: 115 mg/dL — ABNORMAL HIGH (ref 70–99)
Glucose-Capillary: 441 mg/dL — ABNORMAL HIGH (ref 70–99)
Glucose-Capillary: 226 mg/dL — ABNORMAL HIGH (ref 70–99)

## 2022-09-30 LAB — MAGNESIUM: Magnesium: 2.1 mg/dL (ref 1.7–2.4)

## 2022-09-30 LAB — PROCALCITONIN: Procalcitonin: 0.39 ng/mL

## 2022-09-30 LAB — PHOSPHORUS: Phosphorus: 1.1 mg/dL — ABNORMAL LOW (ref 2.5–4.6)

## 2022-09-30 MED ORDER — FAMOTIDINE 20 MG PO TABS
20.0000 mg | ORAL_TABLET | Freq: Every day | ORAL | Status: DC
  Administered 2022-09-30 – 2022-10-03 (×4): 20 mg via ORAL
  Filled 2022-09-30 (×4): qty 1

## 2022-09-30 MED ORDER — BACLOFEN 10 MG PO TABS: 5.0000 mg | ORAL_TABLET | Freq: Three times a day (TID) | ORAL | Status: DC

## 2022-09-30 MED ORDER — HYDROXYZINE PAMOATE 50 MG PO CAPS: 100.0000 mg | ORAL_CAPSULE | Freq: Three times a day (TID) | ORAL | Status: DC | PRN

## 2022-09-30 MED ORDER — CHOLECALCIFEROL 10 MCG (400 UNIT) PO TABS
400.0000 [IU] | ORAL_TABLET | Freq: Every day | ORAL | Status: DC
  Administered 2022-09-30 – 2022-10-03 (×4): 400 [IU] via ORAL
  Filled 2022-09-30 (×4): qty 1

## 2022-09-30 MED ORDER — DIAZEPAM 5 MG PO TABS
20.0000 mg | ORAL_TABLET | Freq: Three times a day (TID) | ORAL | Status: DC
  Administered 2022-09-30 – 2022-10-03 (×9): 20 mg via ORAL
  Filled 2022-09-30 (×9): qty 4

## 2022-09-30 MED ORDER — BACLOFEN 10 MG PO TABS
20.0000 mg | ORAL_TABLET | Freq: Three times a day (TID) | ORAL | Status: DC
  Administered 2022-09-30: 20 mg via ORAL
  Filled 2022-09-30: qty 2

## 2022-09-30 MED ORDER — HYDROXYZINE HCL 10 MG PO TABS: 25.0000 mg | ORAL_TABLET | Freq: Three times a day (TID) | ORAL | Status: DC | PRN

## 2022-09-30 MED ORDER — INSULIN ASPART PROT & ASPART (70-30 MIX) 100 UNIT/ML ~~LOC~~ SUSP
12.0000 [IU] | Freq: Two times a day (BID) | SUBCUTANEOUS | Status: DC
  Filled 2022-09-30: qty 10

## 2022-09-30 MED ORDER — FLUOXETINE HCL 10 MG PO CAPS
10.0000 mg | ORAL_CAPSULE | Freq: Every day | ORAL | Status: DC
  Administered 2022-09-30 – 2022-10-02 (×3): 10 mg via ORAL
  Filled 2022-09-30 (×3): qty 1

## 2022-09-30 MED ORDER — DIAZEPAM 5 MG PO TABS
10.0000 mg | ORAL_TABLET | Freq: Two times a day (BID) | ORAL | Status: DC
  Administered 2022-09-30: 10 mg via ORAL
  Filled 2022-09-30: qty 2

## 2022-09-30 MED ORDER — BACLOFEN 10 MG PO TABS
10.0000 mg | ORAL_TABLET | Freq: Three times a day (TID) | ORAL | Status: DC
  Administered 2022-09-30 – 2022-10-03 (×9): 10 mg via ORAL
  Filled 2022-09-30 (×9): qty 1

## 2022-09-30 MED ORDER — INSULIN ASPART PROT & ASPART (70-30 MIX) 100 UNIT/ML ~~LOC~~ SUSP
8.0000 [IU] | Freq: Two times a day (BID) | SUBCUTANEOUS | Status: DC
  Administered 2022-09-30: 8 [IU] via SUBCUTANEOUS

## 2022-09-30 MED ORDER — DIAZEPAM 5 MG PO TABS: 2.5000 mg | ORAL_TABLET | Freq: Two times a day (BID) | ORAL | Status: DC

## 2022-09-30 MED ORDER — LORATADINE 10 MG PO TABS
10.0000 mg | ORAL_TABLET | Freq: Every day | ORAL | Status: DC
  Administered 2022-09-30 – 2022-10-03 (×4): 10 mg via ORAL
  Filled 2022-09-30 (×4): qty 1

## 2022-09-30 MED ORDER — VITAMIN C 500 MG PO TABS
1000.0000 mg | ORAL_TABLET | Freq: Every day | ORAL | Status: DC
  Administered 2022-09-30 – 2022-10-03 (×4): 1000 mg via ORAL
  Filled 2022-09-30 (×4): qty 2

## 2022-09-30 MED ORDER — INSULIN ASPART 100 UNIT/ML IJ SOLN
2.0000 [IU] | Freq: Once | INTRAMUSCULAR | Status: AC
  Administered 2022-09-30: 2 [IU] via SUBCUTANEOUS

## 2022-09-30 MED ORDER — FAMOTIDINE 20 MG PO TABS: 20.0000 mg | ORAL_TABLET | Freq: Two times a day (BID) | ORAL | Status: DC

## 2022-09-30 MED ORDER — GABAPENTIN 300 MG PO CAPS
300.0000 mg | ORAL_CAPSULE | Freq: Two times a day (BID) | ORAL | Status: DC
  Administered 2022-09-30 – 2022-10-03 (×7): 300 mg via ORAL
  Filled 2022-09-30 (×7): qty 1

## 2022-09-30 MED ORDER — POTASSIUM PHOSPHATES 15 MMOLE/5ML IV SOLN
30.0000 mmol | Freq: Once | INTRAVENOUS | Status: AC
  Administered 2022-09-30: 30 mmol via INTRAVENOUS
  Filled 2022-09-30: qty 10

## 2022-09-30 MED ORDER — ALBUTEROL SULFATE (2.5 MG/3ML) 0.083% IN NEBU: 2.5000 mg | INHALATION_SOLUTION | Freq: Every day | RESPIRATORY_TRACT | Status: DC | PRN

## 2022-09-30 MED ORDER — FLUTICASONE PROPIONATE 50 MCG/ACT NA SUSP
2.0000 | Freq: Every day | NASAL | Status: DC
  Filled 2022-09-30: qty 16

## 2022-09-30 NOTE — Progress Notes (Signed)
PROGRESS NOTE Sherri [name]  [id number] DOB: [birth year] DOA: 09/28/2022 PCP: Michela Pitcher, NP   Brief Narrative/Hospital Course: 29 yof w/ hx of DM on insulin NovoLog sliding scale, Tresiba 10 units at home and stiff mans syndrome hiatal hernia asthma anemia presented with lethargy, high blood sugar. In the ED found to be in DKA blood sugar 539 VBG 6.97.  Bicarb less than 7 creatinine 2.1 CT head chest x-ray no acute finding.  Had received new sample from Endo clinic but did not use for 3 days.  Was admitted for DKA with insulin drip.DKA resolved transitioned to basal bolus insulin and transferred to [facility] service 09/30/22.     Subjective: Seen examined this am Denies nausea vomiting Resting comfortably.+  Assessment and Plan: Principal Problem:   DKA (diabetic ketoacidosis) ([place]) Active Problems:   Acute renal failure superimposed on stage 3 chronic kidney disease ([place])  DKA T1DM on insulin: Managed with IV insulin drip, transitioned to basal bolus insulin-it appears patient was rationing her insulin regimen at home.  For affordability changed to NovoLog mix insulin-adjusted to optimize, continue SSI and monitor and adjust  Recent Labs  Lab 09/29/22 1002 09/29/22 1128 09/29/22 1625 09/29/22 2207 09/30/22 0751  GLUCAP 202* 203* 304* 269* 281*    Dehydration due to DKA resolved  AKI on CKD stage IIIa Metabolic acidosis due to DKA and AKI: Baseline 1.1 in August.Renal ultrasound with atrophy mild right-sided hydronephrosis, AKI in the setting of volume depletion due to DKA, managed with IV fluids, improved as below. 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   DVT prophylaxis: heparin injection 5,000 Units Start: 09/28/22 2200 Code Status:   Code Status: Full Code Family  Communication: plan of care discussed with patient at bedside.  Updated sister Sherri [name] lives alone and they check on her regularly Patient status is: Inpatient because of uncontrolled hyperglycemia Level of care: Progressive   Dispo: The patient is from: Home alone.            Anticipated disposition: Consult PT OT for disposition.   Mobility Assessment (last 72 hours)     Mobility Assessment   No documentation.            Objective: Vitals last 24 hrs: Vitals:   09/30/22 0300 09/30/22 0500 09/30/22 0751 09/30/22 0800  BP: (!) 134/93  135/82   Pulse: 77  78 80  Resp: 13  13 12   Temp:    97.8 F (36.6 C)  TempSrc:    Oral  SpO2: 98%  97% 100%  Weight:  40.9 kg    Height:       Weight change: -8.1 kg  Physical Examination: General exam: alert awake, older than stated age HEENT:Oral mucosa moist, Ear/Nose WNL grossly Respiratory system: bilaterally clear BS, no use of accessory muscle Cardiovascular system: S1 & S2 +, No JVD. Gastrointestinal system: Abdomen soft,NT,ND, BS+ Nervous System:Alert, awake, moving extremities. Extremities: LE edema negative,distal peripheral pulses palpable.  Skin: No rashes,no icterus. MSK: Normal muscle bulk,tone, power  Medications reviewed:  Scheduled Meds:  vitamin C  1,000 mg Oral Daily   baclofen  20 mg Oral TID   Chlorhexidine Gluconate Cloth  6 each Topical Daily   cholecalciferol  400 Units Oral Daily   diazepam  10 mg Oral BID   famotidine  20 mg Oral Daily   FLUoxetine  10 mg Oral Daily  fluticasone  2 spray Each Nare Daily   gabapentin  300 mg Oral BID   heparin  5,000 Units Subcutaneous Q8H   insulin aspart  0-5 Units Subcutaneous QHS   insulin aspart  0-9 Units Subcutaneous TID WC   insulin aspart protamine- aspart  8 Units Subcutaneous BID WC   loratadine  10 mg Oral Daily   Continuous Infusions:  dextrose 5% lactated ringers Stopped (09/29/22 1132)   lactated ringers Stopped (09/28/22 2221)      Data  Reviewed: I have personally reviewed following labs and imaging studies CBC: Recent Labs  Lab 09/28/22 1527 09/29/22 0435  WBC 11.8* 9.7  NEUTROABS 10.6*  --   HGB 14.7 11.7*  HCT 48.3* 34.4*  MCV 92.4 84.5  PLT 245 [id number]   Basic Metabolic Panel: Recent Labs  Lab 09/28/22 2055 09/29/22 0000 09/29/22 0435 09/29/22 0726 09/29/22 1343 09/30/22 0247  NA 138 139 139 138 133* 136  K 3.5 4.7 3.5 3.5 4.3 3.8  CL 110 110 112* 111 105 107  CO2 14* 16* 21* 21* 22 23  GLUCOSE 294* 271* 177* 208* 363* 301*  BUN 38* 36* 33* 31* 26* 19  CREATININE 1.86* 1.52* 1.24* 1.25* 1.13* 1.07*  CALCIUM 9.4 9.8 9.4 9.3 8.5* 8.9  MG 2.4  --  2.1  --   --  2.1  PHOS  --   --  <1.0*  --  2.4*  --    GFR: Estimated Creatinine Clearance: 37 mL/min (A) (by C-G formula based on SCr of 1.07 mg/dL (H)). Liver Function Tests: Recent Labs  Lab 09/28/22 1641  AST 17  ALT 30  ALKPHOS 127*  BILITOT 1.5*  PROT 7.4  ALBUMIN 4.1   Recent Labs  Lab 09/28/22 1527 09/28/22 1641  LIPASE 29 28   Recent Labs  Lab 09/29/22 1002 09/29/22 1128 09/29/22 1625 09/29/22 2207 09/30/22 0751  GLUCAP 202* 203* 304* 269* 281*   Lipid Profile: No results for input(s): "CHOL", "HDL", "LDLCALC", "TRIG", "CHOLHDL", "LDLDIRECT" in the last 72 hours. Thyroid Function Tests: No results for input(s): "TSH", "T4TOTAL", "FREET4", "T3FREE", "THYROIDAB" in the last 72 hours. Sepsis Labs: Recent Labs  Lab 09/28/22 1641 09/29/22 0435 09/30/22 0247  PROCALCITON 0.22 0.45 0.39  Antimicrobials: Culture/Microbiology Radiology Studies: US RENAL  Result Date: 09/28/2022 CLINICAL DATA:  Acute kidney injury. EXAM: RENAL / URINARY TRACT ULTRASOUND COMPLETE COMPARISON:  None Available. FINDINGS: Right Kidney: Renal measurements: 8.2 cm x 3.7 cm x 4.6 cm = volume: 72.8 mL. Echogenicity within normal limits. No mass is visualized. Mild right-sided hydronephrosis is noted. Left Kidney: Renal measurements: 7.4 cm x 4.4 cm x 4.1 cm  = volume: 68.3 mL. Echogenicity within normal limits. No mass or hydronephrosis visualized. Bladder: Appears normal for degree of bladder distention. Other: Limited study secondary to the patient's bowel habitus and overlying bowel gas. IMPRESSION: 1. Renal atrophy. 2. Mild right-sided hydronephrosis. Electronically Signed   By: Virgina Norfolk M.D.   On: 09/28/2022 18:50   CT Head Wo Contrast  Result Date: 09/28/2022 CLINICAL DATA:  Altered mental status. EXAM: CT HEAD WITHOUT CONTRAST TECHNIQUE: Contiguous axial images were obtained from the base of the skull through the vertex without intravenous contrast. RADIATION DOSE REDUCTION: This exam was performed according to the departmental dose-optimization program which includes automated exposure control, adjustment of the mA and/or kV according to patient size and/or use of iterative reconstruction technique. COMPARISON:  August 17, 2022. FINDINGS: Brain: No evidence of acute infarction, hemorrhage, hydrocephalus, extra-axial collection  or mass lesion/mass effect. Vascular: No hyperdense vessel or unexpected calcification. Skull: Normal. Negative for fracture or focal lesion. Sinuses/Orbits: No acute finding. Other: None. IMPRESSION: No acute intracranial abnormality seen. Electronically Signed   By: Marijo Conception M.D.   On: 09/28/2022 16:30   DG Chest Portable 1 View  Result Date: 09/28/2022 CLINICAL DATA:  Altered mental status. EXAM: PORTABLE CHEST 1 VIEW COMPARISON:  September 08, 2021. FINDINGS: The heart size and mediastinal contours are within normal limits. Both lungs are clear. Large hiatal hernia is noted. The visualized skeletal structures are unremarkable. IMPRESSION: Large hiatal hernia.  No other abnormality seen. Electronically Signed   By: Marijo Conception M.D.   On: 09/28/2022 16:26     LOS: 2 days   Antonieta Pert, MD [facility]  09/30/2022, 9:46 AM

## 2022-09-30 NOTE — Hospital Course (Addendum)
59 yof w/ hx of DM on insulin NovoLog sliding scale, Tresiba 10 units at home and stiff mans syndrome hiatal hernia asthma anemia presented with lethargy, high blood sugar. In the ED found to be in DKA blood sugar 539 VBG 6.97.  Bicarb less than 7 creatinine 2.1 CT head chest x-ray no acute finding.  Had received new sample from Endo clinic but did not use for 3 days.  Was admitted for DKA with insulin drip.DKA resolved transitioned to basal bolus insulin and transferred to [facility] service 09/30/22.

## 2022-10-01 DIAGNOSIS — E0810 Diabetes mellitus due to underlying condition with ketoacidosis without coma: Secondary | ICD-10-CM | POA: Diagnosis not present

## 2022-10-01 LAB — BASIC METABOLIC PANEL
Anion gap: 4 — ABNORMAL LOW (ref 5–15)
BUN: 21 mg/dL — ABNORMAL HIGH (ref 6–20)
CO2: 27 mmol/L (ref 22–32)
Calcium: 8.9 mg/dL (ref 8.9–10.3)
Chloride: 109 mmol/L (ref 98–111)
Creatinine, Ser: 0.89 mg/dL (ref 0.44–1.00)
GFR, Estimated: >60 mL/min (ref >60)
Glucose, Bld: 215 mg/dL — ABNORMAL HIGH (ref 70–99)
Potassium: 4 mmol/L (ref 3.5–5.1)
Sodium: 140 mmol/L (ref 135–145)

## 2022-10-01 LAB — GLUCOSE, CAPILLARY
Glucose-Capillary: 274 mg/dL — ABNORMAL HIGH (ref 70–99)
Glucose-Capillary: 270 mg/dL — ABNORMAL HIGH (ref 70–99)
Glucose-Capillary: 310 mg/dL — ABNORMAL HIGH (ref 70–99)
Glucose-Capillary: 81 mg/dL (ref 70–99)
Glucose-Capillary: 125 mg/dL — ABNORMAL HIGH (ref 70–99)

## 2022-10-01 LAB — PHOSPHORUS: Phosphorus: 3 mg/dL (ref 2.5–4.6)

## 2022-10-01 MED ORDER — SIMETHICONE 80 MG PO CHEW
80.0000 mg | CHEWABLE_TABLET | Freq: Four times a day (QID) | ORAL | Status: DC | PRN
  Administered 2022-10-01: 80 mg via ORAL
  Filled 2022-10-01: qty 1

## 2022-10-01 MED ORDER — INSULIN ASPART PROT & ASPART (70-30 MIX) 100 UNIT/ML ~~LOC~~ SUSP
10.0000 [IU] | Freq: Two times a day (BID) | SUBCUTANEOUS | Status: DC
  Administered 2022-10-01: 10 [IU] via SUBCUTANEOUS

## 2022-10-01 MED ORDER — GLUCERNA SHAKE PO LIQD
237.0000 mL | Freq: Three times a day (TID) | ORAL | Status: DC
  Administered 2022-10-01 – 2022-10-03 (×6): 237 mL via ORAL
  Filled 2022-10-01 (×9): qty 237

## 2022-10-01 MED ORDER — INSULIN ASPART PROT & ASPART (70-30 MIX) 100 UNIT/ML ~~LOC~~ SUSP: 12.0000 [IU] | Freq: Two times a day (BID) | SUBCUTANEOUS | Status: DC

## 2022-10-01 MED ORDER — INSULIN ASPART PROT & ASPART (70-30 MIX) 100 UNIT/ML ~~LOC~~ SUSP
12.0000 [IU] | Freq: Two times a day (BID) | SUBCUTANEOUS | Status: DC
  Administered 2022-10-01 – 2022-10-03 (×4): 12 [IU] via SUBCUTANEOUS

## 2022-10-01 MED ORDER — ADULT MULTIVITAMIN W/MINERALS CH
1.0000 | ORAL_TABLET | Freq: Every day | ORAL | Status: DC
  Administered 2022-10-01 – 2022-10-03 (×3): 1 via ORAL
  Filled 2022-10-01 (×3): qty 1

## 2022-10-01 NOTE — Evaluation (Signed)
Physical Therapy Evaluation Patient Details Name: Sherri [name] MRN: [id number] DOB: 10-29-1966 Today's Date: 10/01/2022  History of Present Illness  56 y.o. female presenting to ED with AMS and hypergylcemia with blood sugars 500-600. Work-up (+) DKA. PMHx significant for MI, stiff person syndrome, polysubstance use, Hx of falls with facial trauma and hand sprain, hiatal hernia, asthma and anemia.  Clinical Impression  Pt admitted as above and presenting with functional mobility limitations 2* generalized weakness and mild ambulatory balance deficits.  Pt up to ambulate in hall and reports not at baseline "I usually walk faster than this".  Pt should progress to dc home.     Recommendations for follow up therapy are one component of a multi-disciplinary discharge planning process, led by the attending physician.  Recommendations may be updated based on patient status, additional functional criteria and insurance authorization.  Follow Up Recommendations No PT follow up      Assistance Recommended at Discharge PRN  Patient can return home with the following  Assist for transportation;Help with stairs or ramp for entrance;Assistance with cooking/housework    Equipment Recommendations None recommended by PT  Recommendations for Other Services       Functional Status Assessment Patient has had a recent decline in their functional status and demonstrates the ability to make significant improvements in function in a reasonable and predictable amount of time.     Precautions / Restrictions Precautions Precautions: Fall Precaution Comments: Low fall risk Restrictions Weight Bearing Restrictions: No      Mobility  Bed Mobility Overal bed mobility: Modified Independent                  Transfers Overall transfer level: Needs assistance Equipment used: None Transfers: Sit to/from Stand Sit to Stand: Min guard           General transfer comment: Min gaurd for  controlled descent for stand to sit transfers secondary to stiff person syndrome.    Ambulation/Gait Ambulation/Gait assistance: Min guard, Supervision Gait Distance (Feet): 350 Feet Assistive device: None Gait Pattern/deviations: Step-to pattern, Step-through pattern, Decreased step length - right, Decreased step length - left, Shuffle, Trunk flexed, Wide base of support Gait velocity: decr     General Gait Details: min cues for posture; noted mild instability compensated with increased BOS; min knee flex on swing through.  no overt LOB.  Pt states not at base line "I usually move faster"  Stairs            Wheelchair Mobility    Modified Rankin (Stroke Patients Only)       Balance Overall balance assessment: Mild deficits observed, not formally tested                                           Pertinent Vitals/Pain Pain Assessment Pain Assessment: No/denies pain    Home Living Family/patient expects to be discharged to:: Private residence Living Arrangements: Alone Available Help at Discharge: Family;Available PRN/intermittently Type of Home: Apartment Home Access: Level entry       Home Layout: One level Home Equipment: Agricultural consultant (2 wheels);Cane - single point      Prior Function Prior Level of Function : Independent/Modified Independent             Mobility Comments: Pt reports uses cane outside       Hand Dominance   Dominant Hand: Right  Extremity/Trunk Assessment   Upper Extremity Assessment Upper Extremity Assessment: Generalized weakness    Lower Extremity Assessment Lower Extremity Assessment: Generalized weakness    Cervical / Trunk Assessment Cervical / Trunk Assessment: Normal  Communication   Communication: No difficulties  Cognition Arousal/Alertness: Awake/alert Behavior During Therapy: WFL for tasks assessed/performed Overall Cognitive Status: Within Functional Limits for tasks assessed                                           General Comments      Exercises     Assessment/Plan    PT Assessment Patient needs continued PT services  PT Problem List Decreased strength;Decreased activity tolerance;Decreased balance       PT Treatment Interventions DME instruction;Gait training;Functional mobility training;Therapeutic activities;Therapeutic exercise;Balance training;Patient/family education    PT Goals (Current goals can be found in the Care Plan section)  Acute Rehab PT Goals Patient Stated Goal: Regain IND and return home PT Goal Formulation: With patient Time For Goal Achievement: 10/15/22 Potential to Achieve Goals: Good    Frequency Min 3X/week     Co-evaluation               AM-PAC PT "6 Clicks" Mobility  Outcome Measure Help needed turning from your back to your side while in a flat bed without using bedrails?: None Help needed moving from lying on your back to sitting on the side of a flat bed without using bedrails?: None Help needed moving to and from a bed to a chair (including a wheelchair)?: A Little Help needed standing up from a chair using your arms (e.g., wheelchair or bedside chair)?: A Little Help needed to walk in hospital room?: A Little Help needed climbing 3-5 steps with a railing? : A Little 6 Click Score: 20    End of Session Equipment Utilized During Treatment: Gait belt Activity Tolerance: Patient tolerated treatment well Patient left: in chair;with call bell/phone within reach Nurse Communication: Mobility status PT Visit Diagnosis: Difficulty in walking, not elsewhere classified (R26.2)    Time: 4496-7591 PT Time Calculation (min) (ACUTE ONLY): 25 min   Charges:   PT Evaluation $PT Eval Low Complexity: [place] Pager [phone] Office [phone]   Kelby Adell 10/01/2022, 1:38 PM

## 2022-10-01 NOTE — Evaluation (Signed)
Occupational Therapy Evaluation Patient Details Name: Sherri [name] MRN: [id number] DOB: 1966-01-20 Today's Date: 10/01/2022   History of Present Illness 56 y.o. female presenting to ED with AMS and hypergylcemia with blood sugars 500-600. Work-up (+) DKA. PMHx significant for MI, stiff person syndrome, polysubstance use, Hx of falls with facial trauma and hand sprain, hiatal hernia, asthma and anemia.   Clinical Impression   PTA patient was living alone in an apartment with level entry and was grossly independent with ADLs/IADLs without AD. Patient currently functioning near baseline demonstrating observed ADLs/ADL transfers with Min guard to Mod I overall for steadying/safety. Patient also limited by deficits listed below including generalized weakness and mild balance deficits and would benefit from continued acute OT services in prep for safe d/c home. Family able to provide PRN supervision/assist at time of d/c. OT will continue to follow acutely to maximize safety/independence with self-care tasks, ADL transfers and household mobility.        Recommendations for follow up therapy are one component of a multi-disciplinary discharge planning process, led by the attending physician.  Recommendations may be updated based on patient status, additional functional criteria and insurance authorization.   Follow Up Recommendations  No OT follow up    Assistance Recommended at Discharge None  Patient can return home with the following Assistance with cooking/housework;Assist for transportation    Functional Status Assessment  Patient has had a recent decline in their functional status and demonstrates the ability to make significant improvements in function in a reasonable and predictable amount of time.  Equipment Recommendations  None recommended by OT    Recommendations for Other Services       Precautions / Restrictions Precautions Precautions: Fall Precaution Comments: Low fall  risk Restrictions Weight Bearing Restrictions: No      Mobility Bed Mobility Overal bed mobility: Modified Independent                  Transfers Overall transfer level: Needs assistance Equipment used: None Transfers: Sit to/from Stand Sit to Stand: Min guard           General transfer comment: Min gaurd for controlled descent for stand to sit transfers secondary to stiff person syndrome.      Balance Overall balance assessment: Mild deficits observed, not formally tested                                         ADL either performed or assessed with clinical judgement   ADL Overall ADL's : Needs assistance/impaired     Grooming: Supervision/safety;Standing Grooming Details (indicate cue type and reason): Handwashing standing at sink level with supervision A for safety.                 Toilet Transfer: Min guard;Ambulation Toilet Transfer Details (indicate cue type and reason): To standard height commode in bathroom. Min guard for controlled descent on commode. Difficulty flexing bilateral hips/knees likely 2/2 stiff person syndrome. Toileting- Architect and Hygiene: Min guard;Sit to/from stand Toileting - Clothing Manipulation Details (indicate cue type and reason): Min guard for clothing management for steadying/safety.             Vision Baseline Vision/History: 1 Wears glasses Ability to See in Adequate Light: 0 Adequate Patient Visual Report: No change from baseline Vision Assessment?: No apparent visual deficits     Perception     Praxis  Pertinent Vitals/Pain Pain Assessment Pain Assessment: No/denies pain     Hand Dominance Right   Extremity/Trunk Assessment Upper Extremity Assessment Upper Extremity Assessment: Generalized weakness   Lower Extremity Assessment Lower Extremity Assessment: Defer to PT evaluation   Cervical / Trunk Assessment Cervical / Trunk Assessment: Normal   Communication  Communication Communication: No difficulties   Cognition Arousal/Alertness: Awake/alert Behavior During Therapy: WFL for tasks assessed/performed Overall Cognitive Status: Within Functional Limits for tasks assessed                                       General Comments  VSS on RA.    Exercises     Shoulder Instructions      Home Living Family/patient expects to be discharged to:: Private residence Living Arrangements: Alone Available Help at Discharge: Family;Available PRN/intermittently Type of Home: Apartment Home Access: Level entry     Home Layout: One level     Bathroom Shower/Tub: Teacher, early years/pre: Standard                Prior Functioning/Environment Prior Level of Function : Independent/Modified Independent                        OT Problem List: Impaired balance (sitting and/or standing)      OT Treatment/Interventions: Self-care/ADL training;Therapeutic exercise;Energy conservation;DME and/or AE instruction;Therapeutic activities;Patient/family education;Balance training    OT Goals(Current goals can be found in the care plan section) Acute Rehab OT Goals Patient Stated Goal: To return home. OT Goal Formulation: With patient Time For Goal Achievement: 10/15/22 Potential to Achieve Goals: Good ADL Goals Pt Will Transfer to Toilet: with modified independence;ambulating Pt Will Perform Toileting - Clothing Manipulation and hygiene: with modified independence;sit to/from stand  OT Frequency: Min 2X/week    Co-evaluation              AM-PAC OT "6 Clicks" Daily Activity     Outcome Measure Help from another person eating meals?: None Help from another person taking care of personal grooming?: A Little Help from another person toileting, which includes using toliet, bedpan, or urinal?: A Little Help from another person bathing (including washing, rinsing, drying)?: A Little Help from another person to  put on and taking off regular upper body clothing?: A Little Help from another person to put on and taking off regular lower body clothing?: A Little 6 Click Score: 19   End of Session Equipment Utilized During Treatment: Gait belt Nurse Communication: Mobility status  Activity Tolerance: Patient tolerated treatment well Patient left: in chair;with call bell/phone within reach  OT Visit Diagnosis: Unsteadiness on feet (R26.81)                Time: 3500-9381 OT Time Calculation (min): 19 min Charges:  OT General Charges $OT Visit: 1 Visit OT Evaluation $OT Eval Low Complexity: 1 Low  Sherri [name] H. OTR/L Supplemental OT, Department of rehab services [phone]  Sherri [name] R H. 10/01/2022, 9:53 AM

## 2022-10-01 NOTE — Progress Notes (Signed)
Pt stable on arrival to floor from icu. No needs on arrival to floor. Vs and assessment performed.

## 2022-10-01 NOTE — Progress Notes (Signed)
PROGRESS NOTE Sherri [name]  VZS:[id number] DOB: [birth year] DOA: 09/28/2022 PCP: Sherri Pitcher, NP   Brief Narrative/Hospital Course: 69 yof w/ hx of DM on insulin NovoLog sliding scale, Tresiba 10 units at home and stiff mans syndrome hiatal hernia asthma anemia presented with lethargy, high blood sugar. In the ED found to be in DKA blood sugar 539 VBG 6.97.  Bicarb less than 7 creatinine 2.1 CT head chest x-ray no acute finding.  Had received new sample from Endo clinic but did not use for 3 days.  Was admitted for DKA with insulin drip.DKA resolved transitioned to basal bolus insulin and transferred to [facility] service 09/30/22.  Patient insulin regimen adjusted to optimize blood sugar.  PT OT was consulted for disposition.  Subjective: Alert awake resting comfortably ambulating  Blood sugar has been poorly controlled  Assessment and Plan: Principal Problem:   DKA (diabetic ketoacidosis) ([place]) Active Problems:   Acute renal failure superimposed on stage 3 chronic kidney disease (HCC)  DKA T1DM on insulin: DKA resolved-managed with IV insulin drip, transitioned to subcu insulin, 7030 regimen.  Adjust 7030 regimen further, keep on SSI and monitor as below to adjust insulin regimen.  Recent Labs  Lab 09/30/22 1657 09/30/22 2135 10/01/22 0608 10/01/22 0755 10/01/22 1122  GLUCAP 441* 226* 274* 270* 310*     Dehydration due to DKA resolved.  His oral intake  AKI on CKD stage IIIa Metabolic acidosis due to DKA and AKI: Baseline 1.1 in August.Renal ultrasound with atrophy mild right-sided hydronephrosis, AKI in the setting of volume depletion due to DKA> resolved with IV fluids.  Discontinued IV fluids Recent Labs  Lab 09/29/22 0435 09/29/22 0726 09/29/22 1343 09/30/22 0247 10/01/22 0312  BUN 33* 31* 26* 19 21*  CREATININE 1.24* 1.25* 1.13* 1.07* 0.89    Hypophosphatemia repleted, recheck pending. 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 Body mass index is 15.97 kg/m.  Nutrition Status: Nutrition Problem: Increased nutrient needs Etiology: acute illness Signs/Symptoms: estimated needs Interventions: MVI, Glucerna shake   DVT prophylaxis: heparin injection 5,000 Units Start: 09/28/22 2200 Code Status:   Code Status: Full Code Family Communication: plan of care discussed with patient at bedside. Updated sister Sherri [name] lives alone and they check on her regularly  Patient status is: Inpatient because of uncontrolled hyperglycemia Level of care: Progressive  Dispo: The patient is from: Home alone.            Anticipated disposition: Home once stable   Mobility Assessment (last 72 hours)     Mobility Assessment     Row Name 10/01/22 1300 10/01/22 0900 09/30/22 0800       What is the highest level of mobility based on the progressive mobility assessment? Level 5 (Walks with assist in room/hall) - Balance while stepping forward/back and can walk in room with assist - Complete Level 5 (Walks with assist in room/hall) - Balance while stepping forward/back and can walk in room with assist - Complete Level 5 (Walks with assist in room/hall) - Balance while stepping forward/back and can walk in room with assist - Complete               Objective: Vitals last 24 hrs: Vitals:   10/01/22 1000 10/01/22 1100 10/01/22 1200 10/01/22 1300  BP:   108/85   Pulse: 77 72 75 77  Resp: 15 17 11 18   Temp:      TempSrc:  SpO2: 99% 99% 99% 98%  Weight:      Height:       Weight change:   Physical Examination: General exam: alert awake, pleasant, not in distress  HEENT:Oral mucosa moist, Ear/Nose WNL grossly Respiratory system: Bilaterally clear BS, no use of accessory muscle Cardiovascular system: S1 & S2 +, No JVD. Gastrointestinal system: Abdomen soft,NT,ND, BS+ Nervous System: Alert, awake, moving  extremities, she follows commands. Extremities: LE edema neg,distal peripheral pulses palpable.  Skin: No rashes,no icterus. [id number] muscle bulk,tone, power   Medications reviewed:  Scheduled Meds:  vitamin C  1,000 mg Oral Daily   baclofen  10 mg Oral Q8H   Chlorhexidine Gluconate Cloth  6 each Topical Daily   cholecalciferol  400 Units Oral Daily   diazepam  20 mg Oral Q8H   famotidine  20 mg Oral Daily   feeding supplement (GLUCERNA SHAKE)  237 mL Oral TID BM   FLUoxetine  10 mg Oral Daily   fluticasone  2 spray Each Nare Daily   gabapentin  300 mg Oral BID   heparin  5,000 Units Subcutaneous Q8H   insulin aspart  0-5 Units Subcutaneous QHS   insulin aspart  0-9 Units Subcutaneous TID WC   insulin aspart protamine- aspart  12 Units Subcutaneous BID WC   loratadine  10 mg Oral Daily   multivitamin with minerals  1 tablet Oral Daily  Continuous Infusions:     Data Reviewed: I have personally reviewed following labs and imaging studies CBC: Recent Labs  Lab 09/28/22 1527 09/29/22 0435  WBC 11.8* 9.7  NEUTROABS 10.6*  --   HGB 14.7 11.7*  HCT 48.3* 34.4*  MCV 92.4 84.5  PLT 245 [id number]    Basic Metabolic Panel: Recent Labs  Lab 09/28/22 2055 09/29/22 0000 09/29/22 0435 09/29/22 0726 09/29/22 1343 09/30/22 0247 10/01/22 0312  NA 138   < > 139 138 133* 136 140  K 3.5   < > 3.5 3.5 4.3 3.8 4.0  CL 110   < > 112* 111 105 107 109  CO2 14*   < > 21* 21* 22 23 27   GLUCOSE 294*   < > 177* 208* 363* 301* 215*  BUN 38*   < > 33* 31* 26* 19 21*  CREATININE 1.86*   < > 1.24* 1.25* 1.13* 1.07* 0.89  CALCIUM 9.4   < > 9.4 9.3 8.5* 8.9 8.9  MG 2.4  --  2.1  --   --  2.1  --   PHOS  --   --  <1.0*  --  2.4* 1.1*  --    < > = values in this interval not displayed.    GFR: Estimated Creatinine Clearance: 44.5 mL/min (by C-G formula based on SCr of 0.89 mg/dL). Liver Function Tests: Recent Labs  Lab 09/28/22 1641  AST 17  ALT 30  ALKPHOS 127*  BILITOT 1.5*  PROT  7.4  ALBUMIN 4.1    Recent Labs  Lab 09/28/22 1641 09/29/22 0435 09/30/22 0247  PROCALCITON 0.22 0.45 0.39   Antimicrobials: Culture/Microbiology Radiology Studies: No results found.   LOS: 3 days   Antonieta Pert, MD [facility]  10/01/2022, 2:22 PM

## 2022-10-01 NOTE — Plan of Care (Signed)
  Problem: Education: Goal: Knowledge of General Education information will improve Description: Including pain rating scale, medication(s)/side effects and non-pharmacologic comfort measures Outcome: Progressing   Problem: Health Behavior/Discharge Planning: Goal: Ability to manage health-related needs will improve Outcome: Progressing   Problem: Activity: Goal: Risk for activity intolerance will decrease Outcome: Progressing   

## 2022-10-01 NOTE — Progress Notes (Signed)
Initial Nutrition Assessment  DOCUMENTATION CODES:   Underweight  INTERVENTION:   Multivitamin w/ minerals daily Glucerna Shake po TID, each supplement provides 220 kcal and 10 grams of protein Encourage good PO intake  NUTRITION DIAGNOSIS:   Increased nutrient needs related to acute illness as evidenced by estimated needs.  GOAL:   Patient will meet greater than or equal to 90% of their needs  MONITOR:   PO intake, Supplement acceptance, Labs, I & O's, Weight trends  REASON FOR ASSESSMENT:   Consult Assessment of nutrition requirement/status  ASSESSMENT:   56 y.o. female presented to the ED with lethargy, AMS, and high blood sugars. PMH includes stiff person syndrome, CKD III, T1DM, malnutrition, and anemia. Pt admitted with DKA and AKI on CKD.   RD working remotely at time of assessment. RD to order nutritional supplements due to significant weight loss PTA. Meal Documentation: 100% x 2 meals  Per EMR, pt has had a 17.5% weight loss within 2 months. This is clinically significant for time frame. Suspect malnutrition but unable to diagnosis at it this time due to unable to preform nutrition focused physical exam.   Medications reviewed and include: Vitamin C, Vitamin D3, Pepcid, NovoLog SSI + 10 units Labs reviewed: BUN 21, Phosphorus 1.1, Hgb A1c 12.2% (07/07/22), 24 hr CBGs 115-441  NUTRITION - FOCUSED PHYSICAL EXAM:  Deferred to follow-up.  Diet Order:   Diet Order             Diet Carb Modified Fluid consistency: Thin; Room service appropriate? Yes  Diet effective now                   EDUCATION NEEDS:   No education needs have been identified at this time  Skin:  Skin Assessment: Reviewed RN Assessment  Last BM:  PTA  Height:   Ht Readings from Last 1 Encounters:  09/28/22 5\' 3"  (1.6 m)    Weight:   Wt Readings from Last 1 Encounters:  09/30/22 40.9 kg   BMI:  Body mass index is 15.97 kg/m.  Estimated Nutritional Needs:   Kcal:   1500-1700  Protein:  75-90 grams  Fluid:  >/= 1.5 L    Hermina Barters RD, LDN Clinical Dietitian See [facility] for contact information.

## 2022-10-02 DIAGNOSIS — E0810 Diabetes mellitus due to underlying condition with ketoacidosis without coma: Secondary | ICD-10-CM | POA: Diagnosis not present

## 2022-10-02 LAB — GLUCOSE, CAPILLARY
Glucose-Capillary: 435 mg/dL — ABNORMAL HIGH (ref 70–99)
Glucose-Capillary: 213 mg/dL — ABNORMAL HIGH (ref 70–99)
Glucose-Capillary: 355 mg/dL — ABNORMAL HIGH (ref 70–99)
Glucose-Capillary: 175 mg/dL — ABNORMAL HIGH (ref 70–99)

## 2022-10-02 MED ORDER — INSULIN ASPART 100 UNIT/ML IJ SOLN
2.0000 [IU] | Freq: Once | INTRAMUSCULAR | Status: AC
  Administered 2022-10-02: 2 [IU] via SUBCUTANEOUS

## 2022-10-02 MED ORDER — FLUOXETINE HCL 10 MG PO CAPS: 10.0000 mg | ORAL_CAPSULE | Freq: Every day | ORAL | Status: DC

## 2022-10-02 NOTE — TOC CM/SW Note (Signed)
  Transition of Care [facility]) Screening Note   Patient Details  Name: Sherri [name] Date of Birth: 04-10-1966   Transition of Care [facility]) CM/SW Contact:    Ross Ludwig, LCSW Phone Number: 10/02/2022, 6:06 PM    Transition of Care Department [facility]) has reviewed patient and no TOC needs have been identified at this time. We will continue to monitor patient advancement through interdisciplinary progression rounds. If new patient transition needs arise, please place a TOC consult.

## 2022-10-02 NOTE — Care Management Important Message (Signed)
Important Message  Patient Details IM Letter given Name: Maigan Bittinger MRN: [id number] Date of Birth: 03-Jul-1966   Medicare Important Message Given:  Yes     Kerin Salen 10/02/2022, 2:33 PM

## 2022-10-02 NOTE — Plan of Care (Signed)
  Problem: Pain Managment: Goal: General experience of comfort will improve Outcome: Progressing   Problem: Safety: Goal: Ability to remain free from injury will improve Outcome: Progressing   

## 2022-10-02 NOTE — Discharge Summary (Signed)
PROGRESS NOTE Sherri [name]  [id number]:[id number] DOB: 01-Nov-1966 DOA: 09/28/2022 PCP: Michela Pitcher, NP   Brief Narrative/Hospital Course: 88 yof w/ hx of DM on insulin NovoLog sliding scale, Tresiba 10 units at home and stiff mans syndrome hiatal hernia asthma anemia presented with lethargy, high blood sugar. In the ED found to be in DKA blood sugar 539 VBG 6.97.  Bicarb less than 7 creatinine 2.1 CT head chest x-ray no acute finding.  Had received new sample from Endo clinic but did not use for 3 days.  Was admitted for DKA with insulin drip.DKA resolved transitioned to basal bolus insulin and transferred to [facility] service 09/30/22.  Patient insulin regimen adjusted to optimize blood sugar.  PT OT was consulted for disposition.  Subjective: Seen and examined this morning.  Resting comfortably.  Reports he ate about 5 packets of crackers during the night blood sugar this morning was input 35 We extensively discussed that she should not be eating that much of calorie at off-hours I went over dietary restriction  Assessment and Plan: Principal Problem:   DKA (diabetic ketoacidosis) ([place]) Active Problems:   Acute renal failure superimposed on stage 3 chronic kidney disease ([place])  DKA T1DM on insulin: DKA resolved-managed with IV insulin drip, transitioned to subcu insulin, 70/30 regimen.  Due to ongoing noncompliance blood sugar poorly controlled this morning continue with current 70/30 and SSI with ACHS monitoring and adjust insulin regimen.  Extensively counseled due to her noncompliance. Recent Labs  Lab 10/01/22 0755 10/01/22 1122 10/01/22 1544 10/01/22 2210 10/02/22 0800  GLUCAP 270* 310* 81 125* 435*    Dehydration due to DKA resolved.  Continue p.o.  AKI on CKD stage IIIa/Metabolic acidosis due to DKA and [id number] 1.1 in August.Renal ultrasound with atrophy mild right-sided hydronephrosis, AKI in the setting of volume depletion due to DKA> resolved with IV fluids, now taking  orally.    Hypophosphatemia repleted/normalWith IV fluids.  Encourage oral intake. Normocytic anemia-monitor-stable. 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 Body mass index is 15.97 kg/m.  Nutrition Status: Nutrition Problem: Increased nutrient needs Etiology: acute illness Signs/Symptoms: estimated needs Interventions: MVI, Glucerna shake  DVT prophylaxis: heparin injection 5,000 Units Start: 09/28/22 2200 Code Status:   Code Status: Full Code Family Communication: plan of care discussed with patient at bedside. Updated sister Lorelei Pont lives alone and they check on her regularly  Patient status is: Inpatient because of uncontrolled hyperglycemia Level of care: Med-Surg  Dispo: The patient is from: Home alone.            Anticipated disposition: Home once blood sugar stable likely next 24 hours   Objective: Vitals last 24 hrs: Vitals:   10/01/22 1945 10/02/22 0011 10/02/22 0341 10/02/22 0500  BP: 95/69 104/62 92/62   Pulse: 83 79 65   Resp: 17 17 17    Temp: 99.4 F (37.4 C) 99.4 F (37.4 C) 98.3 F (36.8 C)   TempSrc: Oral Oral Oral   SpO2: 100% 100% 99%   Weight:    44.2 kg  Height:       Weight change:   Physical Examination: General exam: alert awake, oriented, pleasant HEENT:Oral mucosa moist, Ear/Nose WNL grossly Respiratory system: Bilaterally clear BS, without use of accessory muscle Cardiovascular system: S1 & S2 +, No JVD. Gastrointestinal system: Abdomen soft,NT,ND, BS+ Nervous System: Alert, awake, moving extremities, shefollows commands. Extremities: LE edema neg,distal peripheral pulses palpable.  Skin: No rashes,no icterus. MSK: Normal muscle bulk,tone, power  Medications reviewed:  Scheduled Meds:  vitamin C  1,000 mg Oral Daily   baclofen  10 mg Oral Q8H   cholecalciferol  400 Units Oral Daily   diazepam  20 mg Oral Q8H   famotidine  20  mg Oral Daily   feeding supplement (GLUCERNA SHAKE)  237 mL Oral TID BM   FLUoxetine  10 mg Oral Daily   fluticasone  2 spray Each Nare Daily   gabapentin  300 mg Oral BID   heparin  5,000 Units Subcutaneous Q8H   insulin aspart  0-5 Units Subcutaneous QHS   insulin aspart  0-9 Units Subcutaneous TID WC   insulin aspart protamine- aspart  12 Units Subcutaneous BID WC   loratadine  10 mg Oral Daily   multivitamin with minerals  1 tablet Oral Daily  Continuous Infusions:     Data Reviewed: I have personally reviewed following labs and imaging studies CBC: Recent Labs  Lab 09/28/22 1527 09/29/22 0435  WBC 11.8* 9.7  NEUTROABS 10.6*  --   HGB 14.7 11.7*  HCT 48.3* 34.4*  MCV 92.4 84.5  PLT 245 [id number]   Basic Metabolic Panel: Recent Labs  Lab 09/28/22 2055 09/29/22 0000 09/29/22 0435 09/29/22 0726 09/29/22 1343 09/30/22 0247 10/01/22 0312  NA 138   < > 139 138 133* 136 140  K 3.5   < > 3.5 3.5 4.3 3.8 4.0  CL 110   < > 112* 111 105 107 109  CO2 14*   < > 21* 21* 22 23 27   GLUCOSE 294*   < > 177* 208* 363* 301* 215*  BUN 38*   < > 33* 31* 26* 19 21*  CREATININE 1.86*   < > 1.24* 1.25* 1.13* 1.07* 0.89  CALCIUM 9.4   < > 9.4 9.3 8.5* 8.9 8.9  MG 2.4  --  2.1  --   --  2.1  --   PHOS  --   --  <1.0*  --  2.4* 1.1* 3.0   < > = values in this interval not displayed.   GFR: Estimated Creatinine Clearance: 48.1 mL/min (by C-G formula based on SCr of 0.89 mg/dL). Liver Function Tests: Recent Labs  Lab 09/28/22 1641  AST 17  ALT 30  ALKPHOS 127*  BILITOT 1.5*  PROT 7.4  ALBUMIN 4.1   Recent Labs  Lab 09/28/22 1641 09/29/22 0435 09/30/22 0247  PROCALCITON 0.22 0.45 0.39  Antimicrobials: Culture/Microbiology Radiology Studies: No results found.   LOS: 4 days   Antonieta Pert, MD [facility]  10/02/2022, 8:10 AM

## 2022-10-03 ENCOUNTER — Other Ambulatory Visit (HOSPITAL_COMMUNITY): Payer: Self-pay

## 2022-10-03 DIAGNOSIS — E0810 Diabetes mellitus due to underlying condition with ketoacidosis without coma: Secondary | ICD-10-CM | POA: Diagnosis not present

## 2022-10-03 LAB — GLUCOSE, CAPILLARY
Glucose-Capillary: 252 mg/dL — ABNORMAL HIGH (ref 70–99)
Glucose-Capillary: 143 mg/dL — ABNORMAL HIGH (ref 70–99)

## 2022-10-03 MED ORDER — INSULIN PEN NEEDLE 31G X 5 MM MISC: 1.0000 | Freq: Two times a day (BID) | 0 refills | Status: AC

## 2022-10-03 MED ORDER — NOVOLIN 70/30 FLEXPEN RELION (70-30) 100 UNIT/ML ~~LOC~~ SUPN
12.0000 [IU] | PEN_INJECTOR | Freq: Two times a day (BID) | SUBCUTANEOUS | 0 refills | Status: DC
  Filled 2022-10-03: qty 6, 25d supply, fill #0

## 2022-10-03 MED ORDER — INSULIN ASPART PROT & ASPART (70-30 MIX) 100 UNIT/ML ~~LOC~~ SUSP
2.0000 [IU] | Freq: Once | SUBCUTANEOUS | Status: AC
  Administered 2022-10-03: 2 [IU] via SUBCUTANEOUS

## 2022-10-03 MED ORDER — INSULIN ASPART PROT & ASPART (70-30 MIX) 100 UNIT/ML ~~LOC~~ SUSP: 14.0000 [IU] | Freq: Two times a day (BID) | SUBCUTANEOUS | Status: DC

## 2022-10-03 MED ORDER — NOVOLIN 70/30 FLEXPEN RELION (70-30) 100 UNIT/ML ~~LOC~~ SUPN: 12.0000 [IU] | PEN_INJECTOR | Freq: Two times a day (BID) | SUBCUTANEOUS | 0 refills | Status: DC

## 2022-10-03 MED ORDER — INSULIN PEN NEEDLE 31G X 5 MM MISC
1.0000 | Freq: Every day | 0 refills | Status: DC
  Filled 2022-10-03: qty 100, 50d supply, fill #0

## 2022-10-03 NOTE — Progress Notes (Signed)
PT Cancellation Note  Patient Details Name: Sherri [name] MRN: [id number] DOB: 1966/04/23   Cancelled Treatment:    Reason Eval/Treat Not Completed: Patient declined, no reason specified Pt reports being tired. Pt falling asleep while mumbling talking to therapist. RN notified. Pt declines therapy at this time and reports she will d/c home today.    [name] 10/03/2022, 11:16 AM Arlyce Dice, DPT Physical Therapist [facility] Preferred contact method: Secure Chat Weekend Pager Only: [phone] Office: [phone]

## 2022-10-03 NOTE — Plan of Care (Signed)
  Problem: Activity: Goal: Risk for activity intolerance will decrease Outcome: Progressing   Problem: Nutrition: Goal: Adequate nutrition will be maintained Outcome: Progressing   

## 2022-10-03 NOTE — Progress Notes (Addendum)
Discharge package printed and instructions given to patient. Verbalizes understanding. Waiting for her ride.

## 2022-10-03 NOTE — Discharge Summary (Signed)
Physician Discharge Summary  Sherri [name] [id number] DOB: 05/01/1966 DOA: 09/28/2022  PCP: Michela Pitcher, NP  Admit date: 09/28/2022 Discharge date: 10/03/2022 Recommendations for Outpatient Follow-up:  Follow up with PCP in 1 weeks-call for appointment Please check cbg qachs  Discharge Dispo: Home Discharge Condition: Stable Code Status:   Code Status: Full Code Diet recommendation: Carb modified  Brief/Interim Summary: 56 yof w/ hx of DM on insulin NovoLog sliding scale, Tresiba 10 units at home and stiff mans syndrome hiatal hernia asthma anemia presented with lethargy, high blood sugar. In the ED found to be in DKA blood sugar 539 VBG 6.97.  Bicarb less than 7 creatinine 2.1 CT head chest x-ray no acute finding.  Had received new sample from Endo clinic but did not use for 3 days.  Was admitted for DKA with insulin drip.DKA resolved transitioned to basal bolus insulin and transferred to [facility] service 09/30/22.  Patient insulin regimen adjusted to optimize blood sugar.  PT OT was consulted for disposition. Blood sugar this time has been fairly stabilized, patient has been extensively counseled suspect noncompliance is the primary etiology of DKA.  insulin regimen has been adjusted for home  Discharge Diagnoses:  Principal Problem:   DKA (diabetic ketoacidosis) ([place]) Active Problems:   Acute renal failure superimposed on stage 3 chronic kidney disease ([place])  DKA T1DM on insulin: DKA resolved-managed with IV insulin drip, transitioned to subcu insulin, 70/30 regimen.  Due to ongoing noncompliance blood sugar poorly controlled.  At this time fairly stable 70/30 insulin has been adjusted educated and she will be discharged home today.  [facility] and verified that they have the mix insulin today and sent prescription there.  I again instructed the patient to discontinue her long-acting insulin at home. Recent Labs  Lab 10/02/22 0800 10/02/22 1137 10/02/22 1657  10/02/22 2133 10/03/22 0739  GLUCAP 435* 213* 355* 175* 252*   Dehydration due to DKA resolved.  Continue p.o.   AKI on CKD stage IIIa/Metabolic acidosis due to DKA and WJ:[id number] 1.1 in August.Renal ultrasound with atrophy mild right-sided hydronephrosis, AKI in the setting of volume depletion due to DKA> resolved with IV fluids, now taking orally.     Hypophosphatemia repleted/normalWith IV fluids.  Encourage oral intake. Normocytic anemia-monitor-stable. 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 Body mass index is 15.97 kg/m.  Nutrition Status: Nutrition Problem: Increased nutrient needs Etiology: acute illness Signs/Symptoms: estimated needs Interventions: MVI, Glucerna shake Consults: Diabetes: Subjective: Alert awake oriented resting comfortably.  She feels ready for discharge home today. Discharge Exam: Vitals:   10/03/22 0141 10/03/22 0612  BP: (!) 100/58 104/70  Pulse: 69 74  Resp: 17 17  Temp: 98.2 F (36.8 C) 97.7 F (36.5 C)  SpO2: 100% 100%   General: Pt is alert, awake, not in acute distress Cardiovascular: RRR, S1/S2 +, no rubs, no gallops Respiratory: CTA bilaterally, no wheezing, no rhonchi Abdominal: Soft, NT, ND, bowel sounds + Extremities: no edema, no cyanosis  Discharge Instructions  Discharge Instructions     Discharge instructions   Complete by: As directed    Follow up w/ pcp in1 wk please  Check blood sugar 3 times a day and bedtime at home. If blood sugar running above 200 less than 70 please call your MD to adjust insulin. If blood sugars running less 100 do not use insulin and call MD. If you noticed signs and symptoms of hypoglycemia or low blood sugar like  jitteriness, confusion, thirst, tremor, sweating- Check blood sugar, drink sugary drink/biscuits/sweets to increase sugar level and call MD or return to ER. Please call call MD or  return to ER for similar or worsening recurring problem that brought you to hospital or if any fever,nausea/vomiting,abdominal pain, uncontrolled pain, chest pain,  shortness of breath or any other alarming symptoms.  Please follow-up your doctor as instructed in a week time and call the office for appointment.  Please avoid alcohol, smoking, or any other illicit substance and maintain healthy habits including taking your regular medications as prescribed.  You were cared for by a hospitalist during your hospital stay. If you have any questions about your discharge medications or the care you received while you were in the hospital after you are discharged, you can call the unit and ask to speak with the hospitalist on call if the hospitalist that took care of you is not available.  Once you are discharged, your primary care physician will handle any further medical issues. Please note that NO REFILLS for any discharge medications will be authorized once you are discharged, as it is imperative that you return to your primary care physician (or establish a relationship with a primary care physician if you do not have one) for your aftercare needs so that they can reassess your need for medications and monitor your lab values   Increase activity slowly   Complete by: As directed       Allergies as of 10/03/2022       Reactions   Ibuprofen Other (See Comments)   Does not take due to hx of renal insufficiency "I have kidney disease"    Lemon Flavor Swelling   Severe Lip Swelling  FRUIT per pt.     Amoxicillin Diarrhea, Other (See Comments)   Tylenol [acetaminophen] Hives   Cannot take large quantities   Dust Mite Extract Hives   Other reaction(s): Other (See Comments) (as well as paper mites)   Molds & Smuts Hives, Swelling   Other reaction(s): Other (See Comments) Face swelling        Medication List     STOP taking these medications    insulin degludec 100 UNIT/ML FlexTouch  Pen Commonly known as: TRESIBA       TAKE these medications    Accu-Chek SmartView test strip Generic drug: glucose blood USE AS INSTRUCTED TO CHECK SUGARS BEFORE MEALS AND AT BEDTIME   Albuterol Sulfate 108 (90 Base) MCG/ACT Aepb Commonly known as: PROAIR RESPICLICK Inhale 1 puff into the lungs daily as needed (for shortness of breath).   baclofen 20 MG tablet Commonly known as: LIORESAL TAKE 1 TABLET BY MOUTH 3  TIMES DAILY What changed:  how much to take how to take this when to take this additional instructions   cetirizine 10 MG tablet Commonly known as: ZyrTEC Allergy Take 1 tablet (10 mg total) by mouth daily.   Cranberry-Vitamin C-Vitamin E 4200-20-3 MG-MG-UNIT Caps Take 1 tablet by mouth daily.   desloratadine 5 MG tablet Commonly known as: CLARINEX TAKE 1 TABLET BY MOUTH DAILY   diazepam 10 MG tablet Commonly known as: VALIUM TAKE 2 TABLETS BY MOUTH IN THE  MORNING, AT NOON, AND AT BEDTIME What changed: See the new instructions.   EPINEPHrine 0.3 mg/0.3 mL Soaj injection Commonly known as: EPI-PEN INJECT INTRAMUSCULARLY 1 PEN AS  NEEDED FOR ALLERGIC RESPONSE AS  DIRECTED BY MD. Kirke Corin MEDICAL  HELP AFTER USE.   famotidine 20 MG tablet Commonly known as:  PEPCID TAKE 1 TABLET BY MOUTH  TWICE DAILY   FLUoxetine 10 MG capsule Commonly known as: PROZAC TAKE 1 CAPSULE BY MOUTH DAILY   fluticasone 50 MCG/ACT nasal spray Commonly known as: FLONASE Place 2 sprays into both nostrils daily.   gabapentin 600 MG tablet Commonly known as: NEURONTIN Take 600 mg by mouth 2 (two) times daily.   gabapentin 300 MG capsule Commonly known as: NEURONTIN TAKE 1 CAPSULE BY MOUTH TWICE  DAILY   Gvoke HypoPen 2-Pack 0.5 MG/0.1ML Soaj Generic drug: Glucagon Inject 0.1 mLs into the skin as needed (if blood sugar less than 70).   hydrOXYzine 100 MG capsule Commonly known as: VISTARIL TAKE 1 CAPSULE BY MOUTH 3  TIMES DAILY AS NEEDED FOR  ITCHING What changed:  how  much to take how to take this when to take this reasons to take this additional instructions   Insulin Pen Needle 32G X 4 MM Misc 1 each by Does not apply route 2 (two) times daily. Dx E11.9 What changed: Another medication with the same name was added. Make sure you understand how and when to take each.   Insulin Pen Needle 31G X 5 MM Misc 1 each by Does not apply route 2 (two) times daily for 100 doses. What changed: You were already taking a medication with the same name, and this prescription was added. Make sure you understand how and when to take each.   NovoLIN 70/30 Kwikpen (70-30) 100 UNIT/ML KwikPen Generic drug: insulin isophane & regular human KwikPen Inject 12 Units into the skin 2 (two) times daily before a meal. Before breakfast and dinner   NovoLOG FlexPen 100 UNIT/ML FlexPen Generic drug: insulin aspart Max daily 30 units What changed:  how much to take how to take this when to take this additional instructions   vitamin C 1000 MG tablet TAKE 1 TABLET BY MOUTH EVERY DAY   Vitamin D3 10 MCG (400 UNIT) tablet Take 400 Units by mouth daily.   vitamin E 180 MG (400 UNITS) capsule Take 400 Units by mouth daily.        Follow-up Information     Michela Pitcher, NP Follow up in 1 week(s).   Specialties: Nurse Practitioner, Family Medicine Contact information: [place] [address] [phone]                Allergies  Allergen Reactions   Ibuprofen Other (See Comments)    Does not take due to hx of renal insufficiency "I have kidney disease"    Lemon Flavor Swelling    Severe Lip Swelling  FRUIT per pt.     Amoxicillin Diarrhea and Other (See Comments)   Tylenol [Acetaminophen] Hives    Cannot take large quantities   Dust Mite Extract Hives    Other reaction(s): Other (See Comments) (as well as paper mites)   Molds & Smuts Hives and Swelling    Other reaction(s): Other (See Comments) Face swelling    The results  of significant diagnostics from this hospitalization (including imaging, microbiology, ancillary and laboratory) are listed below for reference.    Microbiology: Recent Results (from the past 240 hour(s))  MRSA Next Gen by PCR, Nasal     Status: None   Collection Time: 09/29/22  3:48 AM   Specimen: Nasal Mucosa; Nasal Swab  Result Value Ref Range Status   MRSA by PCR Next Gen NOT DETECTED NOT DETECTED Final    Comment: (NOTE) The GeneXpert MRSA Assay (  FDA approved for NASAL specimens only), is one component of a comprehensive MRSA colonization surveillance program. It is not intended to diagnose MRSA infection nor to guide or monitor treatment for MRSA infections. Test performance is not FDA approved in patients less than 55 years old. Performed at [facility], [place] [address] [place], [place] [address]     Procedures/Studies: US RENAL  Result Date: 09/28/2022 CLINICAL DATA:  Acute kidney injury. EXAM: RENAL / URINARY TRACT ULTRASOUND COMPLETE COMPARISON:  None Available. FINDINGS: Right Kidney: Renal measurements: 8.2 cm x 3.7 cm x 4.6 cm = volume: 72.8 mL. Echogenicity within normal limits. No mass is visualized. Mild right-sided hydronephrosis is noted. Left Kidney: Renal measurements: 7.4 cm x 4.4 cm x 4.1 cm = volume: 68.3 mL. Echogenicity within normal limits. No mass or hydronephrosis visualized. Bladder: Appears normal for degree of bladder distention. Other: Limited study secondary to the patient's bowel habitus and overlying bowel gas. IMPRESSION: 1. Renal atrophy. 2. Mild right-sided hydronephrosis. Electronically Signed   By: Virgina Norfolk M.D.   On: 09/28/2022 18:50   CT Head Wo Contrast  Result Date: 09/28/2022 CLINICAL DATA:  Altered mental status. EXAM: CT HEAD WITHOUT CONTRAST TECHNIQUE: Contiguous axial images were obtained from the base of the skull through the vertex without intravenous contrast. RADIATION DOSE REDUCTION: This exam was performed  according to the departmental dose-optimization program which includes automated exposure control, adjustment of the mA and/or kV according to patient size and/or use of iterative reconstruction technique. COMPARISON:  August 17, 2022. FINDINGS: Brain: No evidence of acute infarction, hemorrhage, hydrocephalus, extra-axial collection or mass lesion/mass effect. Vascular: No hyperdense vessel or unexpected calcification. Skull: Normal. Negative for fracture or focal lesion. Sinuses/Orbits: No acute finding. Other: None. IMPRESSION: No acute intracranial abnormality seen. Electronically Signed   By: Marijo Conception M.D.   On: 09/28/2022 16:30   DG Chest Portable 1 View  Result Date: 09/28/2022 CLINICAL DATA:  Altered mental status. EXAM: PORTABLE CHEST 1 VIEW COMPARISON:  September 08, 2021. FINDINGS: The heart size and mediastinal contours are within normal limits. Both lungs are clear. Large hiatal hernia is noted. The visualized skeletal structures are unremarkable. IMPRESSION: Large hiatal hernia.  No other abnormality seen. Electronically Signed   By: Marijo Conception M.D.   On: 09/28/2022 16:26    Labs: BNP (last 3 results) No results for input(s): "BNP" in the last 8760 hours. Basic Metabolic Panel: Recent Labs  Lab 09/28/22 2055 09/29/22 0000 09/29/22 0435 09/29/22 0726 09/29/22 1343 09/30/22 0247 10/01/22 0312  NA 138   < > 139 138 133* 136 140  K 3.5   < > 3.5 3.5 4.3 3.8 4.0  CL 110   < > 112* 111 105 107 109  CO2 14*   < > 21* 21* 22 23 27   GLUCOSE 294*   < > 177* 208* 363* 301* 215*  BUN 38*   < > 33* 31* 26* 19 21*  CREATININE 1.86*   < > 1.24* 1.25* 1.13* 1.07* 0.89  CALCIUM 9.4   < > 9.4 9.3 8.5* 8.9 8.9  MG 2.4  --  2.1  --   --  2.1  --   PHOS  --   --  <1.0*  --  2.4* 1.1* 3.0   < > = values in this interval not displayed.   Liver Function Tests: Recent Labs  Lab 09/28/22 1641  AST 17  ALT 30  ALKPHOS 127*  BILITOT 1.5*  PROT 7.4  ALBUMIN 4.1   Recent Labs   Lab 09/28/22 1527 09/28/22 1641  LIPASE 29 28   No results for input(s): "AMMONIA" in the last 168 hours. CBC: Recent Labs  Lab 09/28/22 1527 09/29/22 0435  WBC 11.8* 9.7  NEUTROABS 10.6*  --   HGB 14.7 11.7*  HCT 48.3* 34.4*  MCV 92.4 84.5  PLT 245 192   Cardiac Enzymes: Recent Labs  Lab 09/28/22 1641  CKTOTAL 81   BNP: Invalid input(s): "POCBNP" CBG: Recent Labs  Lab 10/02/22 0800 10/02/22 1137 10/02/22 1657 10/02/22 2133 10/03/22 0739  GLUCAP 435* 213* 355* 175* 252*   D-Dimer No results for input(s): "DDIMER" in the last 72 hours. Hgb A1c No results for input(s): "HGBA1C" in the last 72 hours. Lipid Profile No results for input(s): "CHOL", "HDL", "LDLCALC", "TRIG", "CHOLHDL", "LDLDIRECT" in the last 72 hours. Thyroid function studies No results for input(s): "TSH", "T4TOTAL", "T3FREE", "THYROIDAB" in the last 72 hours.  Invalid input(s): "FREET3" Anemia work up No results for input(s): "VITAMINB12", "FOLATE", "FERRITIN", "TIBC", "IRON", "RETICCTPCT" in the last 72 hours. Urinalysis    Component Value Date/Time   COLORURINE STRAW (A) 09/28/2022 1827   APPEARANCEUR CLEAR 09/28/2022 1827   LABSPEC 1.016 09/28/2022 1827   PHURINE 5.0 09/28/2022 1827   GLUCOSEU >=500 (A) 09/28/2022 1827   HGBUR MODERATE (A) 09/28/2022 1827   BILIRUBINUR NEGATIVE 09/28/2022 1827   BILIRUBINUR negative 03/22/2021 1137   KETONESUR 80 (A) 09/28/2022 1827   PROTEINUR 30 (A) 09/28/2022 1827   UROBILINOGEN 0.2 03/22/2021 1137   UROBILINOGEN 1.0 09/28/2018 1246   NITRITE NEGATIVE 09/28/2022 1827   LEUKOCYTESUR NEGATIVE 09/28/2022 1827   Sepsis Labs Recent Labs  Lab 09/28/22 1527 09/29/22 0435  WBC 11.8* 9.7   Microbiology Recent Results (from the past 240 hour(s))  MRSA Next Gen by PCR, Nasal     Status: None   Collection Time: 09/29/22  3:48 AM   Specimen: Nasal Mucosa; Nasal Swab  Result Value Ref Range Status   MRSA by PCR Next Gen NOT DETECTED NOT DETECTED  Final    Comment: (NOTE) The GeneXpert MRSA Assay (FDA approved for NASAL specimens only), is one component of a comprehensive MRSA colonization surveillance program. It is not intended to diagnose MRSA infection nor to guide or monitor treatment for MRSA infections. Test performance is not FDA approved in patients less than 36 years old. Performed at [facility], [place] [address], [place], [place] [address]   Time coordinating discharge: 25 minutes  SIGNED: Antonieta Pert, MD  [facility] 10/03/2022, 11:44 AM  If 7PM-7AM, please contact night-coverage [email or web address]

## 2022-10-23 ENCOUNTER — Other Ambulatory Visit: Payer: Self-pay | Admitting: Pharmacist

## 2022-10-23 ENCOUNTER — Telehealth: Payer: Self-pay | Admitting: Pharmacist

## 2022-10-23 MED ORDER — ALBUTEROL SULFATE 108 (90 BASE) MCG/ACT IN AEPB: 1.0000 | INHALATION_SPRAY | Freq: Four times a day (QID) | RESPIRATORY_TRACT | 1 refills | Status: DC | PRN

## 2022-10-23 NOTE — Telephone Encounter (Signed)
Received message from patient. We have not been able to connect for CCM visit (transfer from another office) and she requests a call to schedule an appt, either phone or in person. She reports she is usually not awake/answering call until after noon.

## 2022-10-23 NOTE — Telephone Encounter (Signed)
Patient requests refill of albuterol inhaler. Routing to PCP.

## 2022-10-26 NOTE — Telephone Encounter (Signed)
Patient has been scheduled for a telephone visit on 11/02/2022 at 1:30 with Charlene Brooke.  Charlene Brooke, CPP notified  Marijean Niemann, [place] Clinical Pharmacy Assistant [phone]

## 2022-10-30 ENCOUNTER — Telehealth: Payer: Self-pay

## 2022-10-30 MED ORDER — ALBUTEROL SULFATE HFA 108 (90 BASE) MCG/ACT IN AERS: 2.0000 | INHALATION_SPRAY | Freq: Four times a day (QID) | RESPIRATORY_TRACT | 1 refills | Status: DC | PRN

## 2022-10-30 NOTE — Progress Notes (Signed)
Chronic Care Management Pharmacy Assistant   Name: Sherri [name]  MRN: [id number] DOB: 02/08/1966  Reason for Encounter: CCM (Appointment Reminder) Medications: Outpatient Encounter Medications as of 10/30/2022  Medication Sig Note   ACCU-CHEK SMARTVIEW test strip USE AS INSTRUCTED TO CHECK SUGARS BEFORE MEALS AND AT BEDTIME    albuterol (VENTOLIN HFA) 108 (90 Base) MCG/ACT inhaler Inhale 2 puffs into the lungs every 6 (six) hours as needed for wheezing or shortness of breath.    Ascorbic Acid (VITAMIN C) 1000 MG tablet TAKE 1 TABLET BY MOUTH EVERY DAY (Patient taking differently: Take 1,000 mg by mouth daily.)    baclofen (LIORESAL) 20 MG tablet TAKE 1 TABLET BY MOUTH 3  TIMES DAILY (Patient taking differently: Take 20 mg by mouth 3 (three) times daily.)    cetirizine (ZYRTEC ALLERGY) 10 MG tablet Take 1 tablet (10 mg total) by mouth daily.    Cholecalciferol (VITAMIN D3) 10 MCG (400 UNIT) tablet Take 400 Units by mouth daily.    Cranberry-Vitamin C-Vitamin E 4200-20-3 MG-MG-UNIT CAPS Take 1 tablet by mouth daily.    desloratadine (CLARINEX) 5 MG tablet TAKE 1 TABLET BY MOUTH DAILY (Patient taking differently: Take 5 mg by mouth daily.)    diazepam (VALIUM) 10 MG tablet TAKE 2 TABLETS BY MOUTH IN THE  MORNING, AT NOON, AND AT BEDTIME (Patient taking differently: Take 10 mg by mouth 2 (two) times daily.)    EPINEPHRINE 0.3 mg/0.3 mL IJ SOAJ injection INJECT INTRAMUSCULARLY 1 PEN AS  NEEDED FOR ALLERGIC RESPONSE AS  DIRECTED BY MD. SEEK MEDICAL  HELP AFTER USE.    famotidine (PEPCID) 20 MG tablet TAKE 1 TABLET BY MOUTH  TWICE DAILY (Patient taking differently: Take 20 mg by mouth 2 (two) times daily.)    FLUoxetine (PROZAC) 10 MG capsule TAKE 1 CAPSULE BY MOUTH DAILY (Patient taking differently: Take 10 mg by mouth daily.)    fluticasone (FLONASE) 50 MCG/ACT nasal spray Place 2 sprays into both nostrils daily.    gabapentin (NEURONTIN) 300 MG capsule TAKE 1 CAPSULE BY MOUTH TWICE  DAILY  (Patient taking differently: Take 300 mg by mouth 2 (two) times daily.)    gabapentin (NEURONTIN) 600 MG tablet Take 600 mg by mouth 2 (two) times daily.    Glucagon (GVOKE HYPOPEN 2-PACK) 0.5 MG/0.1ML SOAJ Inject 0.1 mLs into the skin as needed (if blood sugar less than 70). (Patient not taking: Reported on 08/17/2022) 08/17/2022: Did not start on this yet   hydrOXYzine (VISTARIL) 100 MG capsule TAKE 1 CAPSULE BY MOUTH 3  TIMES DAILY AS NEEDED FOR  ITCHING (Patient taking differently: Take 100 mg by mouth 3 (three) times daily as needed for itching.)    insulin aspart (NOVOLOG FLEXPEN) 100 UNIT/ML FlexPen Max daily 30 units (Patient taking differently: Inject 6-13 Units into the skin 3 (three) times daily with meals. Per sliding scale)    insulin isophane & regular human KwikPen (NOVOLIN 70/30 KWIKPEN) (70-30) 100 UNIT/ML KwikPen Inject 12 Units into the skin 2 (two) times daily before a meal. Before breakfast and dinner    Insulin Pen Needle 31G X 5 MM MISC 1 each by Does not apply route 2 (two) times daily for 100 doses.    Insulin Pen Needle 32G X 4 MM MISC 1 each by Does not apply route 2 (two) times daily. Dx E11.9    vitamin E 180 MG (400 UNITS) capsule Take 400 Units by mouth daily.     No facility-administered encounter medications on  file as of 10/30/2022.   Sherri [name] was contacted to remind of upcoming telephone visit with Charlene Brooke on 11/02/2022 at 1:30. Patient was reminded to have any blood glucose and blood pressure readings available for review at appointment.   Message was left reminding patient of appointment.  CCM referral has been placed prior to visit?  Yes   Star Rating Drugs: Medication:  Last Fill: Day Supply No star rating drugs  Charlene Brooke, CPP notified  Marijean Niemann, [place] Clinical Pharmacy Assistant [phone]

## 2022-10-30 NOTE — Telephone Encounter (Addendum)
Sending note to Romilda Garret NP and Anastasiya CMA.   [facility] - Client >>>Contains Verbal Order - Signature Required<<< TELEPHONE ADVICE RECORD AccessNurse Patient Name: Sherri [name] Gender: Female DOB: [birth year] Age: 56 Y 1 M 25 D Return Phone Number: [phone] (Primary) Address: City/ State/ Zip: [place] [address] Client [place] Night - Client Client Site [place] Provider Romilda Garret- NP Contact Type Call Who Is Calling Patient / Member / Family / Caregiver Call Type Triage / Clinical Relationship To Patient Self Return Phone Number [phone] (Primary) Chief Complaint Prescription Refill or Medication Request (non symptomatic) Reason for Call Medication Question / Request Initial Comment Caller states the wrong Rx was sent to the pharmacy, should have been albuterol inhaler in a red and white box 108 90day. Uses it daily and is completely out. Translation No Nurse Assessment Nurse: Terence Lux, RN, Christine Date/Time (Eastern Time): 10/28/2022 7:40:25 PM Confirm and document reason for call. If symptomatic, describe symptoms. ---Caller states the wrong Rx was sent to the pharmacy, should have been albuterol inhaler in a red and white box 108 90day. Uses it daily and is completely out. Does the patient have any new or worsening symptoms? ---No Nurse: Terence Lux, RN, Christine Date/Time (Eastern Time): 10/28/2022 7:42:37 PM Please select the assessment type ---Refill Additional Documentation ---per directive-sent in refill for the pt's previously ordered albuterol sulfate (generic ventolin) Does the patient have enough medication to last until the office opens? ---No Disp. Time Eilene Ghazi Time) Disposition Final User 10/28/2022 6:32:10 PM Send To Nurse Estanislado Pandy, RN, Lisa 10/28/2022 7:11:47 PM Send To RN Personal Terence Lux, RN, Christine 10/28/2022 7:43:45 PM  Clinical Call Yes Terence Lux, RN, Christine Final Disposition 10/28/2022 7:43:45 PM Clinical Call Yes Terence Lux, RN, Christine PLEASE NOTE: All timestamps contained within this report are represented as [place] Standard Time. CONFIDENTIALTY NOTICE: This fax transmission is intended only for the addressee. It contains information that is legally privileged, confidential or otherwise protected from use or disclosure. If you are not the intended recipient, you are strictly prohibited from reviewing, disclosing, copying using or disseminating any of this information or taking any action in reliance on or regarding this information. If you have received this fax in error, please notify us immediately by telephone so that we can arrange for its return to [place]. Phone: [phone], Toll-Free: [phone], Fax: [phone] Page: 2 of 3 Call Id: [id number] Verbal Orders/Maintenance Medications Medication Refill Route Dosage Regime Duration Admin Instructions User Name Albuterol Sulfate (Generic Ventolin) Yes Inhaler 2puffs by mouth every 6 hours PRN As Needed 2puffs by mouth every 6 hours PRN Terence Lux, RN, Christin

## 2022-10-30 NOTE — Addendum Note (Signed)
Addended by: Kris Mouton on: 10/30/2022 11:33 AM   Modules accepted: Orders

## 2022-10-30 NOTE — Telephone Encounter (Signed)
Correct RX sent in and patient notified on voicemail.

## 2022-11-01 ENCOUNTER — Telehealth: Payer: Medicare Other

## 2022-11-02 ENCOUNTER — Ambulatory Visit (INDEPENDENT_AMBULATORY_CARE_PROVIDER_SITE_OTHER): Payer: Medicare Other | Admitting: Pharmacist

## 2022-11-02 DIAGNOSIS — J4520 Mild intermittent asthma, uncomplicated: Secondary | ICD-10-CM

## 2022-11-02 DIAGNOSIS — G2582 Stiff-man syndrome: Secondary | ICD-10-CM

## 2022-11-02 DIAGNOSIS — E1065 Type 1 diabetes mellitus with hyperglycemia: Secondary | ICD-10-CM

## 2022-11-02 NOTE — Progress Notes (Signed)
Chronic Care Management Pharmacy Note  11/10/2022 Name:  Sherri [name] MRN:  [id number] DOB:  05/13/1966  Summary: CCM Initial visit -DM: A1c 12.2% (06/2022); recent hospitalization for DKA 09/28/22 after missing Tresiba doses for several days due to cost concerns - pt was discharged on Novolin 70/30 and Novolog sliding scale; since being home she has sometimes taking Novolin 70/30 and Tresiba in same day; she reports 1 episode of hypoglycemia (44) last week, otherwise BG range has been 152-297 (checking 4-5x times daily) -Reviewed insurance formulary - Basaglar, Levemir and Novolog are preferred insulins -HLD: LDL 83 (05/2020); she is due for re-evaluation; statin is indicated given DM dx  Recommendations/Changes made from today's visit: -Recommend to avoid Tresiba and Novolin 70/30 in same day; advised to continue Novolin 70/30 per hospital instructions for now; consider changing to [place] - will consult with endocrine -Advised to restart Novolog sliding scale per hospital instructions -Ordered Freestyle Libre 3 to [facility] -Recommend lipid panel at next [place]; consider starting a statin  Plan: -[facility] will call patient to schedule OV for CGM training -Pharmacist follow up OV scheduled for TBD (within 2-3 weeks) -PCP appt 11/20/22 -Endocrine appt 11/28/22    Subjective: Sherri [name] is an 56 y.o. year old female who is a primary patient of Michela Pitcher, NP.  The CCM team was consulted for assistance with disease management and care coordination needs.    Engaged with patient by telephone for initial visit in response to provider referral for pharmacy case management and/or care coordination services.   Consent to Services:  The patient was given the following information about Chronic Care Management services today, agreed to services, and gave verbal consent: 1. CCM service includes personalized support from designated clinical staff supervised by the  primary care provider, including individualized plan of care and coordination with other care providers 2. 24/7 contact phone numbers for assistance for urgent and routine care needs. 3. Service will only be billed when office clinical staff spend 20 minutes or more in a month to coordinate care. 4. Only one practitioner may furnish and bill the service in a calendar month. 5.The patient may stop CCM services at any time (effective at the end of the month) by phone call to the office staff. 6. The patient will be responsible for cost sharing (co-pay) of up to 20% of the service fee (after annual deductible is met). Patient agreed to services and consent obtained.  Patient Care Team: Michela Pitcher, NP as PCP - General (Nurse Practitioner) Charlton Haws, [facility] as Pharmacist (Pharmacist)  Recent office visits: 08/17/22 NP Romilda Garret OV: establish care. Episode of slurred speech, falling and hitting her head. Stat CT ordered - no acute abnormality. LFT slightly elevated, recheck next OV. Other labs stable.   07/19/22 NP Minette Brine ([facility]): f/u  DM. Continue f/u with endo. Rx' [place]. Discharged from practice due to multiple no shows.  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   07/07/22 Dr Kelton Pillar (Endocrine): f/u DM. Dx 2021. Dx changed from T2 to T1 based on elevated islet cell Ab titers. BG range 46 - 600. Advised correction scale with pre-meal glucose. CGM cost-prohibitive.  Hospital visits: 09/28/22 - 10/03/22 Admission ([facility]): DKA, ongoing noncompliance. Transitioned to 70/30 insulin 12 units BID- discontinue home insulin Tyler Aas)  Objective:  Lab Results  Component Value Date   CREATININE 0.89 10/01/2022   BUN 21 (H) 10/01/2022  GFR 55.00 (L) 08/17/2022   EGFR 52 (L) 07/19/2022   GFRNONAA >60 10/01/2022   GFRAA 59 (L) 01/25/2021   NA 140 10/01/2022   K 4.0 10/01/2022   CALCIUM 8.9  10/01/2022   CO2 27 10/01/2022   GLUCOSE 215 (H) 10/01/2022    Lab Results  Component Value Date/Time   HGBA1C 12.2 (A) 07/07/2022 02:24 PM   HGBA1C 11.3 (A) 03/07/2022 02:51 PM   HGBA1C 12.5 (H) 09/08/2021 03:30 PM   HGBA1C 13.0 (H) 05/06/2021 05:41 AM   GFR 55.00 (L) 08/17/2022 10:55 AM   GFR 55.40 (L) 08/08/2021 10:58 AM   MICROALBUR 42m 03/22/2021 11:39 AM   MICROALBUR 30 06/04/2020 12:29 PM    Last diabetic Eye exam: No results found for: "HMDIABEYEEXA"  Last diabetic Foot exam: No results found for: "HMDIABFOOTEX"   Lab Results  Component Value Date   CHOL 172 06/03/2020   HDL 75 06/03/2020   LDLCALC 83 06/03/2020   TRIG 76 06/03/2020   CHOLHDL 2.3 06/03/2020       Latest Ref Rng & Units 09/28/2022    4:41 PM 09/15/2022   11:35 AM 08/17/2022   10:55 AM  Hepatic Function  Total Protein 6.5 - 8.1 g/dL 7.4  7.1  6.9   Albumin 3.5 - 5.0 g/dL 4.1  4.2  4.0   AST 15 - 41 U/L 17  26  62   ALT 0 - 44 U/L 30  41  82   Alk Phosphatase 38 - 126 U/L 127  114  117   Total Bilirubin 0.3 - 1.2 mg/dL 1.5  0.4  0.2   Bilirubin, Direct 0.0 - 0.2 mg/dL 0.2       Lab Results  Component Value Date/Time   TSH 0.91 08/17/2022 10:55 AM   TSH 0.555 09/09/2021 01:42 AM   TSH 2.28 08/08/2021 10:58 AM   FREET4 0.94 08/08/2021 10:58 AM       Latest Ref Rng & Units 09/29/2022    4:35 AM 09/28/2022    3:27 PM 09/15/2022   11:35 AM  CBC  WBC 4.0 - 10.5 K/uL 9.7  11.8  5.5   Hemoglobin 12.0 - 15.0 g/dL 11.7  14.7  12.6   Hematocrit 36.0 - 46.0 % 34.4  48.3  38.1   Platelets 150 - 400 K/uL 192  245  189     Lab Results  Component Value Date/Time   VD25OH 74.0 12/26/2016 10:02 AM   VD25OH 58.8 08/24/2016 10:11 AM    Clinical ASCVD: No  The 10-year ASCVD risk score (Arnett DK, et al., 2019) is: 7.3%   Values used to calculate the score:     Age: 89years     Sex: Female     Is Non-Hispanic African American: Yes     Diabetic: Yes     Tobacco smoker: Yes     Systolic Blood  Pressure: 104 mmHg     Is BP treated: No     HDL Cholesterol: 75 mg/dL     Total Cholesterol: 172 mg/dL       11/10/2021    2:34 PM 07/14/2021   12:27 PM 06/03/2020    3:43 PM  Depression screen PHQ 2/9  Decreased Interest 0 0 0  Down, Depressed, Hopeless 0 0 0  PHQ - 2 Score 0 0 0  Altered sleeping   0  Tired, decreased energy   0  Change in appetite   0  Feeling bad or failure about yourself  0  Trouble concentrating   0  Moving slowly or fidgety/restless   0  Suicidal thoughts   0  PHQ-9 Score   0  Difficult doing work/chores   Not difficult at all     Social History   Tobacco Use  Smoking Status Every Day   Types: Cigars  Smokeless Tobacco Never  Tobacco Comments   Never Used Tobacco   BP Readings from Last 3 Encounters:  10/03/22 104/70  09/15/22 126/83  08/17/22 104/72   Pulse Readings from Last 3 Encounters:  10/03/22 74  09/15/22 60  08/17/22 83   Wt Readings from Last 3 Encounters:  10/03/22 102 lb 8.2 oz (46.5 kg)  08/17/22 109 lb 6 oz (49.6 kg)  07/19/22 107 lb 3.2 oz (48.6 kg)   BMI Readings from Last 3 Encounters:  10/03/22 18.16 kg/m  08/17/22 19.37 kg/m  07/19/22 18.99 kg/m    Assessment/Interventions: Review of patient past medical history, allergies, medications, health status, including review of consultants reports, laboratory and other test data, was performed as part of comprehensive evaluation and provision of chronic care management services.   SDOH:  (Social Determinants of Health) assessments and interventions performed: Yes  SDOH Interventions    Flowsheet Row Chronic Care Management from 11/02/2022 in [facility] Management from 03/29/2022 in [place] Telephone from 06/30/2021 in [place] Management from 05/25/2021 in [place] Management from 02/07/2021 in [place] Management from 09/21/2020 in [facility]  SDOH Interventions        Food Insecurity Interventions -- Other (Comment)  [patients FNS benefit recently cut. Will begin using food pantry she is established with] -- -- [id number] Referral  [Referral placed to [place] [employer], [employer], Solicitor, and [place] Referral  [[facility] [place] Interventions -- -- -- --  Kandice Robinsons CCM team and PCP team] -- Intervention Not Indicated  Financial Strain Interventions Other (Comment)  [formulary review for preferred insulins] -- --  [Patient assistance for Tyler Aas, CCM Social Worker involved in patient case.] -- -- --      [place]: Food Insecurity Present (09/28/2022)  Housing: Medium Risk (09/28/2022)  Transportation Needs: Unmet Transportation Needs (09/28/2022)  Utilities: At Risk (09/28/2022)  Depression (PHQ2-9): Low Risk  (11/10/2021)  Financial Resource Strain: High Risk (11/10/2022)  Physical Activity: Inactive (07/14/2021)  Stress: No Stress Concern Present (06/03/2020)  Tobacco Use: High Risk (09/28/2022)    CCM Care Plan  Allergies  Allergen Reactions   Ibuprofen Other (See Comments)    Does not take due to hx of renal insufficiency "I have kidney disease"    Lemon Flavor Swelling    Severe Lip Swelling  FRUIT per pt.     Amoxicillin Diarrhea and Other (See Comments)   Tylenol [Acetaminophen] Hives    Cannot take large quantities   Dust Mite Extract Hives    Other reaction(s): Other (See Comments) (as well as paper mites)   Molds & Smuts Hives and Swelling    Other reaction(s): Other (See Comments) Face swelling    Medications Reviewed Today     Reviewed by Charlton Haws, [facility] (Pharmacist) on 11/02/22 at 1440  Med List Status: <None>   Medication Order Taking? Sig Documenting Provider Last Dose Status Informant  ACCU-CHEK SMARTVIEW test  strip [id number] Yes USE  AS INSTRUCTED TO CHECK SUGARS BEFORE MEALS AND AT BEDTIME [name], Melanie Crazier, MD Taking Active Self  albuterol (VENTOLIN HFA) 108 (90 Base) MCG/ACT inhaler [id number] Yes Inhale 2 puffs into the lungs every 6 (six) hours as needed for wheezing or shortness of breath. Michela Pitcher, NP Taking Active   Ascorbic Acid (VITAMIN C) 1000 MG tablet [id number] Yes TAKE 1 TABLET BY MOUTH EVERY DAY Minette Brine, FNP Taking Active Self  baclofen (LIORESAL) 20 MG tablet [id number] Yes TAKE 1 TABLET BY MOUTH 3  TIMES DAILY Suzzanne Cloud, NP Taking Active Self  cetirizine (ZYRTEC ALLERGY) 10 MG tablet [id number] Yes Take 1 tablet (10 mg total) by mouth daily. Minette Brine, FNP Taking Active Self  Cholecalciferol (VITAMIN D3) 10 MCG (400 UNIT) tablet [id number] Yes Take 400 Units by mouth daily. [provider] Taking Active Self  Cranberry-Vitamin C-Vitamin E 4200-20-3 MG-MG-UNIT CAPS [id number] Yes Take 1 tablet by mouth daily. [provider] Taking Active Self  desloratadine (CLARINEX) 5 MG tablet [id number] Yes TAKE 1 TABLET BY MOUTH DAILY Minette Brine, FNP Taking Active Self  diazepam (VALIUM) 10 MG tablet [id number] Yes TAKE 2 TABLETS BY MOUTH IN THE  MORNING, AT NOON, AND AT BEDTIME Penumalli, Earlean Polka, MD Taking Active Self  EPINEPHRINE 0.3 mg/0.3 mL IJ SOAJ injection [id number] Yes INJECT INTRAMUSCULARLY 1 PEN AS  NEEDED FOR ALLERGIC RESPONSE AS  DIRECTED BY MD. [place] AFTER USE. Minette Brine, FNP Taking Active Self  famotidine (PEPCID) 20 MG tablet [id number] Yes TAKE 1 TABLET BY MOUTH  TWICE DAILY Minette Brine, FNP Taking Active Self  FLUoxetine (PROZAC) 10 MG capsule [id number] Yes TAKE 1 CAPSULE BY MOUTH DAILY Suzzanne Cloud, NP Taking Active Self  fluticasone (FLONASE) 50 MCG/ACT nasal spray [id number] Yes Place 2 sprays into both nostrils daily. Scot Jun, FNP Taking Active Self  gabapentin (NEURONTIN) 300 MG capsule [id number] Yes TAKE 1  CAPSULE BY MOUTH TWICE  DAILY Suzzanne Cloud, NP Taking Active Self  gabapentin (NEURONTIN) 600 MG tablet [id number] Yes Take 600 mg by mouth 2 (two) times daily. [provider] Taking Active Self  Glucagon (GVOKE HYPOPEN 2-PACK) 0.5 MG/0.1ML SOAJ [id number] Yes Inject 0.1 mLs into the skin as needed (if blood sugar less than 70). Minette Brine, FNP Taking Active Self           Med Note Rolan Bucco Nov 02, 2022  2:40 PM) Did not pick up  hydrOXYzine (VISTARIL) 100 MG capsule [id number] Yes TAKE 1 CAPSULE BY MOUTH 3  TIMES DAILY AS NEEDED FOR  Gerrit Friends, FNP Taking Active Self  insulin aspart (NOVOLOG FLEXPEN) 100 UNIT/ML FlexPen [id number] No Max daily 30 units  Patient not taking: Reported on 11/02/2022   [name], Melanie Crazier, MD Not Taking Active Self  insulin isophane & regular human KwikPen (NOVOLIN 70/30 KWIKPEN) (70-30) 100 UNIT/ML KwikPen [id number] Yes Inject 12 Units into the skin 2 (two) times daily before a meal. Before breakfast and dinner Kc, Ramesh, MD Taking Active   Insulin Pen Needle 31G X 5 MM MISC [id number] Yes 1 each by Does not apply route 2 (two) times daily for 100 doses. Antonieta Pert, MD Taking Active   Insulin Pen Needle 32G X 4 MM MISC [id number] Yes 1 each by Does not apply route 2 (two) times daily. Dx E11.9 Minette Brine, FNP Taking Active Self  vitamin E 180 MG (400 UNITS) capsule [id number] Yes Take 400 Units by mouth daily.  [provider] Taking Active Self            Patient Active Problem List   Diagnosis Date Noted   Acute renal failure superimposed on stage 3 chronic kidney disease ([place])    DKA (diabetic ketoacidosis) ([place]) 09/28/2022   Fall 08/17/2022   LOC (loss of consciousness) ([place]) 08/17/2022   Slurred speech 08/17/2022   Type 1 diabetes mellitus with hyperglycemia ([place]) 11/11/2021   Malnutrition of moderate degree 09/09/2021   AMS (altered mental status) 09/09/2021   Acute encephalopathy 09/08/2021    Acute lower UTI 09/08/2021   Elevated LFTs 09/08/2021   Elevated lipase 09/08/2021   Hashimoto's disease 08/09/2021   Hypoglycemia 05/05/2021   AKI (acute kidney injury) ([place]) 05/05/2021   Anxiety    Chronic rhinitis 04/24/2019   Keloid 10/10/2018   Depression 01/02/2017   History of DVT (deep vein thrombosis) 01/02/2017   Chronic pain syndrome 01/02/2017   Cocaine abuse ([place]) 01/02/2017   Dysphagia    Adjustment insomnia    Distal radius fracture, right 08/16/2016   Postconcussion syndrome 12/30/2014   Tremor 03/11/2014   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   Hiatal hernia 03/21/2013   Anemia, iron deficiency 01/29/2013   Spasm of muscle 09/02/2012   Anemia 08/28/2012   Mild intermittent asthma 08/27/2012   Chronic back pain    Pernicious anemia 10/30/2011   DEGENERATIVE [place] DISEASE 08/18/2010   Asthma with bronchitis 07/28/2010   Recurrent urticaria 07/28/2010    Immunization History  Administered Date(s) Administered   Tdap 04/25/2016, 09/08/2021    Conditions to be addressed/monitored:  Hyperlipidemia, Diabetes, Asthma, Depression, and Anxiety  Care Plan : [place]  Updates made by Charlton Haws, [place] since 11/10/2022 12:00 AM     Problem: Hyperlipidemia, Diabetes, Asthma, Depression, and Anxiety   Priority: High     Long-Range Goal: Disease Management   Start Date: 11/02/2022  Expected End Date: 11/11/2023  This Visit's Progress: On track  Recent Progress: On track  Priority: High  Note:   Current Barriers:  Unable to achieve control of diabetes  Suboptimal therapeutic regimen for diabetes Does not contact provider office for questions/concerns  Pharmacist Clinical Goal(s):  Patient will achieve improvement in DM as evidenced by improved BG adhere to plan to optimize therapeutic regimen for DM as evidenced by report of adherence to recommended medication management changes contact provider office for questions/concerns as  evidenced notation of same in electronic health record through collaboration with PharmD and provider.   Interventions: 1:1 collaboration with Michela Pitcher, NP regarding development and update of comprehensive plan of care as evidenced by provider attestation and co-signature Inter-disciplinary care team collaboration (see longitudinal plan of care) Comprehensive medication review performed; medication list updated in electronic medical record   Diabetes - T1 (A1c goal <7%) -Uncontrolled - A1c 12.2% (06/2022), recent admission for DKA 09/28/22 - per discussion with patient today, she was out of [place] for a few days prior to admission due to cost issues. She was discharged on Novolin 70/30 mainly for lower cost. Since being home she has used both [place] and Novolin 70/30 in same day (but not often/not every day) -DM Dx 2021. Dx changed from T2 to T1 based on elevated islet cell Ab titers. Follows with endocrine (Dr Kelton Pillar) -Pt has tried to get CGM before - Dexcom was cost-prohibitive ($28) in 2022; Freestyle Elenor Legato [facility] and never picked up (cost concerns); it has not been sent to DME -Current  home glucose readings: checks 4-5x daily  Range: 152 -297  Low glucose: 44 last week -Current medications: Novolin 70/30 12 units BID w/ meals - Appropriate, Query Effective Tresiba U100 10 units daily - Query appropriate (in conjuction with Novolin 70/30) Novolog Flexpen SSI - not taking Gvoke Hypopen - not picked up Testing supplies -Medications previously tried: [place], glipizide, metformin, Tresiba -Current meal patterns:  breakfast: egg and cheese biscuit lunch: sandwich, or a hot dog bun with sliced ham and mayonnaise with sugar free yogurt dinner: microwave ready green beans, with red potatoes, and a sandwich snacks: glucerna, cottage cheese drinks: zero gatorade, diet cranberry juice, diet ginger ale; Drinking more water -Current exercise: not currently  exercising -Educated on A1c and blood sugar goals; Complications of diabetes including kidney damage, retinal damage, and cardiovascular disease; Prevention and management of hypoglycemic episodes; -Reviewed benefits of CGM, insurance may cover this through DME -Reviewed BCBS FEP formulary: Basaglar, Levemir, Novolog are preferred insulins - will provide endocrine with this information -Recommend to avoid Tresiba and Novolin 70/30 in same day; advised to continue Novolin 70/30 per hospital instructions for now; consider changing to [place] to restart Novolog sliding scale per hospital instructions -Ordered Freestyle Libre 3 to [facility]   Asthma / Allergies (Goal: control symptoms and prevent exacerbations) -Controlled -Spirometry 04/2019 - FEV1 99% predicted; FEV1/FVC 0.73 -Current treatment  Albuterol HFA PRN - Appropriate, Effective, Safe, Accessible Cetirizine 10 mg daily -Appropriate, Effective, Safe, Accessible Desloratadine 5 mg daily - Appropriate, Effective, Safe, Accessible Flonase PRN -Appropriate, Effective, Safe, Accessible Hydroxyzine 100 mg daily PRN itching -Appropriate, Effective, Safe, Accessible Famotidine 20 mg BID Appropriate, Effective, Safe, Accessible Epi-Pen PRN -Appropriate, Effective, Safe, Accessible -Exacerbations requiring treatment in last 6 months: 0 -Patient denies consistent use of maintenance inhaler -Frequency of rescue inhaler use: rare, none in the past four months  -Counseled on [place] inhaler technique; When to use rescue inhaler -Recommended to continue current medication   Hyperlipidemia: (LDL goal < 70) -Uncontrolled - LDL 83 (05/2020), due for repeat lipid panel -10-yr ASCVD risk 7.3% -Current treatment: None -Medications previously tried: none  -Educated on Cholesterol goals; Benefits of statin for ASCVD risk reduction; statin indication with diabetes -Recommend repeat lipid panel; consider statin inititation    Depression/Anxiety (Goal: manage symptoms) -Controlled -PHQ9: 0 (10/2021) -GAD7: n/a -Connected with Neurology for mental health support -Current treatment: Fluoxetine 10 mg daily - Appropriate, Effective, Safe, Accessible Diazepam 10 mg - 2 tab TID -Appropriate, Effective, Safe, Accessible Hydroxyzine 100 mg daily PRN itching -Appropriate, Effective, Safe, Accessible -Medications previously tried/failed: n/a -Educated on Benefits of medication for symptom control -Recommended to continue current medication  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, Dr Leta Baptist -Current treatment  Baclofen 20 mg TID -Appropriate, Effective, Safe, Accessible Gabapentin 300 mg BID -Appropriate, Effective, Safe, Accessible Diazepam 10 mg - 2 tab TID -Appropriate, Effective, Safe, Accessible -Medications previously tried: n/a  -Recommended to continue current medication   GERD (Goal: minimize symptoms of reflux ) -Controlled -Current treatment  Famotidine 20 mg BID -Appropriate, Effective, Safe, Accessible -Medications previously tried: none reported  -Hx of Bleeds/ulcers: No -Recommended to continue current medication   Health Maintenance -Vaccine gaps: Shingrix -Medication management: most meds come through workers comp, but they won't pay for diabetic meds -Current therapy:  Vitamin C 1000 mg Vitamin D 400 IU Vitamin E 400 IU Cranberry - Vitamin C - Vitamin E    Patient Goals/Self-Care Activities Patient will:  - take medications as prescribed  as evidenced by patient report and record review focus on medication adherence by routine check glucose 3x daily, document, and provide at future appointments      Medication Assistance: None required.  Patient affirms current coverage meets needs.  Compliance/Adherence/Medication fill history: Care Gaps: Eye exam Foot exam Mammogram AWV  Star-Rating Drugs: None  Medication Access: Within the past 30 days, how  often has patient missed a dose of medication? 0 Is a pillbox or other method used to improve adherence? Yes  Factors that may affect medication adherence? financial need Are meds synced by current pharmacy? No  Are meds delivered by current pharmacy? Yes  Does patient experience delays in picking up medications due to transportation concerns? No   [facility] Reviewed: Is patient disadvantaged to use [facility]?: Yes  Current Rx insurance plan: BCBS FEP Name and location of [id number] [place] [id number]: [phone]: [phone] [address] [place] [address]Phone: [phone]: [address] [place]Phone: [phone]: [phone] [facility] services reviewed with patient today?: No  Patient requests to transfer care to [facility]?: No  Reason patient declined to change pharmacies: Disadvantaged due to insurance/mail order   Care Plan and Follow Up Patient Decision:  Patient agrees to Care Plan and Follow-up.  Plan: Face to Face appointment with care management team member scheduled for: TBD - once CGM supplies arrive  LCharlene Brooke PharmD, BBayonet Point Surgery Center LtdClinical Pharmacist LCumberland [facility]Primary Care at [phone]

## 2022-11-03 ENCOUNTER — Telehealth: Payer: Self-pay

## 2022-11-03 NOTE — Telephone Encounter (Signed)
Can we see if we can get patient in sooner if there are any cancellation or openings.

## 2022-11-03 NOTE — Telephone Encounter (Signed)
Patient was rescheduled to 11/28/2022 at 11:30 AM and put on Waitlist for sooner cancellation.

## 2022-11-06 ENCOUNTER — Telehealth: Payer: Self-pay | Admitting: Nurse Practitioner

## 2022-11-06 NOTE — Telephone Encounter (Signed)
Patient called in and stated that they will be sending her the freestyle libre. She was wanting to notify you so she can come in to see her when its in. Thank you!

## 2022-11-07 NOTE — Telephone Encounter (Signed)
It looks like Freestyle Josephine Igo will be delivered around 11/17. Please schedule training appt in person after that date - advise pt to bring all supplies. **call patient after noon**

## 2022-11-09 NOTE — Chronic Care Management (AMB) (Signed)
Patient returned my call and scheduled appointment for office visit for [facility] training with CPP.  Al Corpus, CPP notified  Burt Knack, [facility] Health concierge  [phone]

## 2022-11-09 NOTE — Telephone Encounter (Signed)
Attempted to contact the patient. No voice mailbox set up. Would like to set up appointment with CPP for CGM training with freestyle libre. I will be glad to schedule the patient if she calls in .  Al Corpus, CPP notified  Burt Knack, [facility] concierge  [phone]

## 2022-11-10 NOTE — Patient Instructions (Signed)
Visit Information  Phone number for Pharmacist: [phone]   Goals Addressed   None     Care Plan : CCM Pharmacy Care Plan  Updates made by Kathyrn Sheriff, RPH since 11/10/2022 12:00 AM     Problem: Hyperlipidemia, Diabetes, Asthma, Depression, and Anxiety   Priority: High     Long-Range Goal: Disease Management   Start Date: 11/02/2022  Expected End Date: 11/11/2023  This Visit's Progress: On track  Recent Progress: On track  Priority: High  Note:   Current Barriers:  Unable to achieve control of diabetes  Suboptimal therapeutic regimen for diabetes Does not contact provider office for questions/concerns  Pharmacist Clinical Goal(s):  Patient will achieve improvement in DM as evidenced by improved BG adhere to plan to optimize therapeutic regimen for DM as evidenced by report of adherence to recommended medication management changes contact provider office for questions/concerns as evidenced notation of same in electronic health record through collaboration with PharmD and provider.   Interventions: 1:1 collaboration with Eden Emms, NP regarding development and update of comprehensive plan of care as evidenced by provider attestation and co-signature Inter-disciplinary care team collaboration (see longitudinal plan of care) Comprehensive medication review performed; medication list updated in electronic medical record   Diabetes - T1 (A1c goal <7%) -Uncontrolled - A1c 12.2% (06/2022), recent admission for DKA 09/28/22 - per discussion with patient today, she was out of [place] for a few days prior to admission due to cost issues. She was discharged on Novolin 70/30 mainly for lower cost. Since being home she has used both [place] and Novolin 70/30 in same day (but not often/not every day) -DM Dx 2021. Dx changed from T2 to T1 based on elevated islet cell Ab titers. Follows with endocrine (Dr Lonzo Cloud) -Pt has tried to get CGM before - Dexcom was cost-prohibitive  ($28) in 2022; Freestyle Josephine Igo was sent to [facility] 02/2022 and never picked up (cost concerns); it has not been sent to DME -Current home glucose readings: checks 4-5x daily  Range: 152 -297  Low glucose: 44 last week -Current medications: Novolin 70/30 12 units BID w/ meals - Appropriate, Query Effective Tresiba U100 10 units daily - Query appropriate (in conjuction with Novolin 70/30) Novolog Flexpen SSI - not taking Gvoke Hypopen - not picked up Testing supplies -Medications previously tried: Farxiga, glipizide, metformin, Tresiba -Current meal patterns:  breakfast: egg and cheese biscuit lunch: sandwich, or a hot dog bun with sliced ham and mayonnaise with sugar free yogurt dinner: microwave ready green beans, with red potatoes, and a sandwich snacks: glucerna, cottage cheese drinks: zero gatorade, diet cranberry juice, diet ginger ale; Drinking more water -Current exercise: not currently exercising -Educated on A1c and blood sugar goals; Complications of diabetes including kidney damage, retinal damage, and cardiovascular disease; Prevention and management of hypoglycemic episodes; -Reviewed benefits of CGM, insurance may cover this through DME -Reviewed [employer] FEP formulary: Basaglar, Levemir, Novolog are preferred insulins - will provide endocrine with this information -Recommend to avoid Tresiba and Novolin 70/30 in same day; advised to continue Novolin 70/30 per hospital instructions for now; consider changing to Basaglar  -Advised to restart Novolog sliding scale per hospital instructions -Ordered Freestyle Libre 3 to [facility]   Asthma / Allergies (Goal: control symptoms and prevent exacerbations) -Controlled -Spirometry 04/2019 - FEV1 99% predicted; FEV1/FVC 0.73 -Current treatment  Albuterol HFA PRN - Appropriate, Effective, Safe, Accessible Cetirizine 10 mg daily -Appropriate, Effective, Safe, Accessible Desloratadine 5 mg daily - Appropriate, Effective, Safe,  Accessible Flonase PRN -Appropriate, Effective, Safe, Accessible Hydroxyzine 100 mg daily PRN itching -Appropriate, Effective, Safe, Accessible Famotidine 20 mg BID Appropriate, Effective, Safe, Accessible Epi-Pen PRN -Appropriate, Effective, Safe, Accessible -Exacerbations requiring treatment in last 6 months: 0 -Patient denies consistent use of maintenance inhaler -Frequency of rescue inhaler use: rare, none in the past four months  -Counseled on Proper inhaler technique; When to use rescue inhaler -Recommended to continue current medication   Hyperlipidemia: (LDL goal < 70) -Uncontrolled - LDL 83 (05/2020), due for repeat lipid panel -10-yr ASCVD risk 7.3% -Current treatment: None -Medications previously tried: none  -Educated on Cholesterol goals; Benefits of statin for ASCVD risk reduction; statin indication with diabetes -Recommend repeat lipid panel; consider statin inititation   Depression/Anxiety (Goal: manage symptoms) -Controlled -PHQ9: 0 (10/2021) -GAD7: n/a -Connected with Neurology for mental health support -Current treatment: Fluoxetine 10 mg daily - Appropriate, Effective, Safe, Accessible Diazepam 10 mg - 2 tab TID -Appropriate, Effective, Safe, Accessible Hydroxyzine 100 mg daily PRN itching -Appropriate, Effective, Safe, Accessible -Medications previously tried/failed: n/a -Educated on Benefits of medication for symptom control -Recommended to continue current medication  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, Dr Marjory Lies -Current treatment  Baclofen 20 mg TID -Appropriate, Effective, Safe, Accessible Gabapentin 300 mg BID -Appropriate, Effective, Safe, Accessible Diazepam 10 mg - 2 tab TID -Appropriate, Effective, Safe, Accessible -Medications previously tried: n/a  -Recommended to continue current medication   GERD (Goal: minimize symptoms of reflux ) -Controlled -Current treatment  Famotidine 20 mg BID -Appropriate, Effective,  Safe, Accessible -Medications previously tried: none reported  -Hx of Bleeds/ulcers: No -Recommended to continue current medication   Health Maintenance -Vaccine gaps: Shingrix -Medication management: most meds come through workers comp, but they won't pay for diabetic meds -Current therapy:  Vitamin C 1000 mg Vitamin D 400 IU Vitamin E 400 IU Cranberry - Vitamin C - Vitamin E    Patient Goals/Self-Care Activities Patient will:  - take medications as prescribed as evidenced by patient report and record review focus on medication adherence by routine check glucose 3x daily, document, and provide at future appointments      Patient verbalizes understanding of instructions and care plan provided today and agrees to view in MyChart. Active MyChart status and patient understanding of how to access instructions and care plan via MyChart confirmed with patient.    Telephone follow up appointment with pharmacy team member scheduled for: 2-3 weeks  Al Corpus, PharmD, [facility] Clinical Pharmacist [facility] [phone]

## 2022-11-13 ENCOUNTER — Ambulatory Visit: Payer: Medicare Other | Admitting: Pharmacist

## 2022-11-13 ENCOUNTER — Telehealth: Payer: Self-pay | Admitting: Nurse Practitioner

## 2022-11-13 ENCOUNTER — Ambulatory Visit: Payer: Medicare Other | Admitting: Diagnostic Neuroimaging

## 2022-11-13 DIAGNOSIS — E1065 Type 1 diabetes mellitus with hyperglycemia: Secondary | ICD-10-CM

## 2022-11-13 DIAGNOSIS — E162 Hypoglycemia, unspecified: Secondary | ICD-10-CM

## 2022-11-13 NOTE — Telephone Encounter (Signed)
Pt called stating she seen Sherri [name] today, 11/13/22 & her meter is giving a message saying "sensor error". Pt is requesting advice on what to do. Call back # [phone]

## 2022-11-13 NOTE — Progress Notes (Signed)
Chronic Care Management Pharmacy Note  11/13/2022 Name:  Sherri [name] MRN:  [id number] DOB:  07-08-1966  Summary: CCM F/U visit Patient presented for [facility] training.  Demonstrated with teach-back: -components of sensor and how to prepare sensor for application -how to apply sensor to arm. Patient successfully applied sensor to R arm today -how to navigate Reader device in order to [name] sensor; sensor was successfully paired today -how to adjust high/low glucose alarms. Low alarm set to 70 and high alarm set to 300 today.  Educated patient on: -viewing reader at any time to view real-time glucose readings (new reading every 1 minute) -changing sensor every 14 days or as initiated by app/reader. Each new sensor has 60-minute warmup period. -avoiding high doses of Vitamin C > 500 mg/day -removing sensor prior to MRI, CT scans -difference between blood glucose and "sensor" glucose -if glucose readings ever seem wrong/questionable, check glucose with a finger stick -Libreview cloud monitoring: Patient did not enroll in [place]  Provided customer service line for [employer] in case of sensor breakdowns: [phone]   Pt voiced understanding of above and denies further questions.  Recommendations/Changes made from today's visit: -advised to continue Novolin 70/30 per hospital instructions for now; consider changing to [place] - will consult with endocrine -Advised to restart Novolog sliding scale per hospital instructions -Recommend lipid panel at next OV; consider starting a statin   Plan: -Pharmacist follow up televisit scheduled for 1 month -PCP appt 11/20/22 -Endocrine appt 11/28/22    Subjective: Sherri [name] is an 56 y.o. year old female who is a primary patient of Michela Pitcher, NP.  The CCM team was consulted for assistance with disease management and care coordination needs.    Engaged with patient by telephone for follow up visit in  response to provider referral for pharmacy case management and/or care coordination services.   Consent to Services:  The patient was given information about Chronic Care Management services, agreed to services, and gave verbal consent prior to initiation of services.  Please see initial visit note for detailed documentation.   Patient Care Team: Michela Pitcher, NP as PCP - General (Nurse Practitioner) Charlton Haws, [facility] as Pharmacist (Pharmacist)  Recent office visits: 08/17/22 NP Romilda Garret OV: establish care. Episode of slurred speech, falling and hitting her head. Stat CT ordered - no acute abnormality. LFT slightly elevated, recheck next OV. Other labs stable.   07/19/22 NP Minette Brine ([facility]): f/u  DM. Continue f/u with endo. Rx' [place]. Discharged from practice due to multiple no shows.  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   07/07/22 Dr Kelton Pillar (Endocrine): f/u DM. Dx 2021. Dx changed from T2 to T1 based on elevated islet cell Ab titers. BG range 46 - 600. Advised correction scale with pre-meal glucose. CGM cost-prohibitive.  Hospital visits: 09/28/22 - 10/03/22 Admission ([facility]): DKA, ongoing noncompliance. Transitioned to 70/30 insulin 12 units BID- discontinue home insulin Tyler Aas)  Objective:  Lab Results  Component Value Date   CREATININE 0.89 10/01/2022   BUN 21 (H) 10/01/2022   GFR 55.00 (L) 08/17/2022   EGFR 52 (L) 07/19/2022   GFRNONAA >60 10/01/2022   GFRAA 59 (L) 01/25/2021   NA 140 10/01/2022   K 4.0 10/01/2022   CALCIUM 8.9 10/01/2022   CO2 27 10/01/2022   GLUCOSE 215 (H) 10/01/2022    Lab Results  Component Value Date/Time   HGBA1C 12.2 (A) 07/07/2022 02:24  PM   HGBA1C 11.3 (A) 03/07/2022 02:51 PM   HGBA1C 12.5 (H) 09/08/2021 03:30 PM   HGBA1C 13.0 (H) 05/06/2021 05:41 AM   GFR 55.00 (L) 08/17/2022 10:55 AM   GFR 55.40 (L) 08/08/2021 10:58 AM    MICROALBUR 98m 03/22/2021 11:39 AM   MICROALBUR 30 06/04/2020 12:29 PM    Last diabetic Eye exam: No results found for: "HMDIABEYEEXA"  Last diabetic Foot exam: No results found for: "HMDIABFOOTEX"   Lab Results  Component Value Date   CHOL 172 06/03/2020   HDL 75 06/03/2020   LDLCALC 83 06/03/2020   TRIG 76 06/03/2020   CHOLHDL 2.3 06/03/2020       Latest Ref Rng & Units 09/28/2022    4:41 PM 09/15/2022   11:35 AM 08/17/2022   10:55 AM  Hepatic Function  Total Protein 6.5 - 8.1 g/dL 7.4  7.1  6.9   Albumin 3.5 - 5.0 g/dL 4.1  4.2  4.0   AST 15 - 41 U/L 17  26  62   ALT 0 - 44 U/L 30  41  82   Alk Phosphatase 38 - 126 U/L 127  114  117   Total Bilirubin 0.3 - 1.2 mg/dL 1.5  0.4  0.2   Bilirubin, Direct 0.0 - 0.2 mg/dL 0.2       Lab Results  Component Value Date/Time   TSH 0.91 08/17/2022 10:55 AM   TSH 0.555 09/09/2021 01:42 AM   TSH 2.28 08/08/2021 10:58 AM   FREET4 0.94 08/08/2021 10:58 AM       Latest Ref Rng & Units 09/29/2022    4:35 AM 09/28/2022    3:27 PM 09/15/2022   11:35 AM  CBC  WBC 4.0 - 10.5 K/uL 9.7  11.8  5.5   Hemoglobin 12.0 - 15.0 g/dL 11.7  14.7  12.6   Hematocrit 36.0 - 46.0 % 34.4  48.3  38.1   Platelets 150 - 400 K/uL 192  245  189     Lab Results  Component Value Date/Time   VD25OH 74.0 12/26/2016 10:02 AM   VD25OH 58.8 08/24/2016 10:11 AM    Clinical ASCVD: No  The 10-year ASCVD risk score (Arnett DK, et al., 2019) is: 7.3%   Values used to calculate the score:     Age: [age]years     Sex: Female     Is Non-Hispanic African American: Yes     Diabetic: Yes     Tobacco smoker: Yes     Systolic Blood Pressure: 1546mmHg     Is BP treated: No     HDL Cholesterol: 75 mg/dL     Total Cholesterol: 172 mg/dL       11/10/2021    2:34 PM 07/14/2021   12:27 PM 06/03/2020    3:43 PM  Depression screen PHQ 2/9  Decreased Interest 0 0 0  Down, Depressed, Hopeless 0 0 0  PHQ - 2 Score 0 0 0  Altered sleeping   0  Tired, decreased energy    0  Change in appetite   0  Feeling bad or failure about yourself    0  Trouble concentrating   0  Moving slowly or fidgety/restless   0  Suicidal thoughts   0  PHQ-9 Score   0  Difficult doing work/chores   Not difficult at all     Social History   Tobacco Use  Smoking Status Every Day   Types: Cigars  Smokeless Tobacco Never  Tobacco Comments   Never Used Tobacco   BP Readings from Last 3 Encounters:  10/03/22 104/70  09/15/22 126/83  08/17/22 104/72   Pulse Readings from Last 3 Encounters:  10/03/22 74  09/15/22 60  08/17/22 83   Wt Readings from Last 3 Encounters:  10/03/22 102 lb 8.2 oz (46.5 kg)  08/17/22 109 lb 6 oz (49.6 kg)  07/19/22 107 lb 3.2 oz (48.6 kg)   BMI Readings from Last 3 Encounters:  10/03/22 18.16 kg/m  08/17/22 19.37 kg/m  07/19/22 18.99 kg/m    Assessment/Interventions: Review of patient past medical history, allergies, medications, health status, including review of consultants reports, laboratory and other test data, was performed as part of comprehensive evaluation and provision of chronic care management services.   SDOH:  (Social Determinants of Health) assessments and interventions performed: Yes  SDOH Interventions    Flowsheet Row Chronic Care Management from 11/02/2022 in [place] at [place] Management from 03/29/2022 in [place] Telephone from 06/30/2021 in [place] Management from 05/25/2021 in [place] Management from 02/07/2021 in [place] Management from 09/21/2020 in [facility]  SDOH Interventions        Food Insecurity Interventions -- Other (Comment)  [patients FNS benefit recently cut. Will begin using food pantry she is established with] -- -- [id number] Referral  [Referral placed to [place] [employer], [employer], Equities trader, and [place] Referral  [[facility] [place] Interventions -- -- -- --  Kandice Robinsons CCM team and PCP team] -- Intervention Not Indicated  Financial Strain Interventions Other (Comment)  [formulary review for preferred insulins] -- --  [Patient assistance for Tyler Aas, CCM Social Worker involved in patient case.] -- -- --      [place]: Food Insecurity Present (09/28/2022)  Housing: Medium Risk (09/28/2022)  Transportation Needs: Unmet Transportation Needs (09/28/2022)  Utilities: At Risk (09/28/2022)  Depression (PHQ2-9): Low Risk  (11/10/2021)  Financial Resource Strain: High Risk (11/10/2022)  Physical Activity: Inactive (07/14/2021)  Stress: No Stress Concern Present (06/03/2020)  Tobacco Use: High Risk (09/28/2022)    CCM Care Plan  Allergies  Allergen Reactions   Ibuprofen Other (See Comments)    Does not take due to hx of renal insufficiency "I have kidney disease"    Lemon Flavor Swelling    Severe Lip Swelling  FRUIT per pt.     Amoxicillin Diarrhea and Other (See Comments)   Tylenol [Acetaminophen] Hives    Cannot take large quantities   Dust Mite Extract Hives    Other reaction(s): Other (See Comments) (as well as paper mites)   Molds & Smuts Hives and Swelling    Other reaction(s): Other (See Comments) Face swelling    Medications Reviewed Today     Reviewed by Charlton Haws, [facility] (Pharmacist) on 11/13/22 at 1422  Med List Status: <None>   Medication Order Taking? Sig Documenting Provider Last Dose Status Informant  ACCU-CHEK SMARTVIEW test strip [id number] Yes USE AS INSTRUCTED TO CHECK SUGARS BEFORE MEALS AND AT BEDTIME [name], Melanie Crazier, MD Taking Active Self  albuterol (VENTOLIN HFA) 108 (90 Base) MCG/ACT inhaler [id number] Yes Inhale 2 puffs into the lungs every 6 (six) hours as needed for wheezing or shortness of breath. Michela Pitcher, NP Taking Active   Ascorbic  Acid (VITAMIN C) 1000 MG tablet  [id number] Yes TAKE 1 TABLET BY MOUTH EVERY DAY Minette Brine, FNP Taking Active Self  baclofen (LIORESAL) 20 MG tablet [id number] Yes TAKE 1 TABLET BY MOUTH 3  TIMES DAILY Suzzanne Cloud, NP Taking Active Self  cetirizine (ZYRTEC ALLERGY) 10 MG tablet [id number] Yes Take 1 tablet (10 mg total) by mouth daily. Minette Brine, FNP Taking Active Self  Cholecalciferol (VITAMIN D3) 10 MCG (400 UNIT) tablet [id number] Yes Take 400 Units by mouth daily. [provider] Taking Active Self  Cranberry-Vitamin C-Vitamin E 4200-20-3 MG-MG-UNIT CAPS [id number] Yes Take 1 tablet by mouth daily. [provider] Taking Active Self  desloratadine (CLARINEX) 5 MG tablet [id number] Yes TAKE 1 TABLET BY MOUTH DAILY Minette Brine, FNP Taking Active Self  diazepam (VALIUM) 10 MG tablet [id number] Yes TAKE 2 TABLETS BY MOUTH IN THE  MORNING, AT NOON, AND AT BEDTIME Penumalli, Earlean Polka, MD Taking Active Self  EPINEPHRINE 0.3 mg/0.3 mL IJ SOAJ injection [id number] Yes INJECT INTRAMUSCULARLY 1 PEN AS  NEEDED FOR ALLERGIC RESPONSE AS  DIRECTED BY MD. [place] AFTER USE. Minette Brine, FNP Taking Active Self  famotidine (PEPCID) 20 MG tablet [id number] Yes TAKE 1 TABLET BY MOUTH  TWICE DAILY Minette Brine, FNP Taking Active Self  FLUoxetine (PROZAC) 10 MG capsule [id number] Yes TAKE 1 CAPSULE BY MOUTH DAILY Suzzanne Cloud, NP Taking Active Self  fluticasone (FLONASE) 50 MCG/ACT nasal spray [id number] Yes Place 2 sprays into both nostrils daily. Scot Jun, FNP Taking Active Self  gabapentin (NEURONTIN) 300 MG capsule [id number] Yes TAKE 1 CAPSULE BY MOUTH TWICE  DAILY Suzzanne Cloud, NP Taking Active Self  gabapentin (NEURONTIN) 600 MG tablet [id number] Yes Take 600 mg by mouth 2 (two) times daily. [provider] Taking Active Self  Glucagon (GVOKE HYPOPEN 2-PACK) 0.5 MG/0.1ML SOAJ [id number] Yes Inject 0.1 mLs into the skin as needed (if blood sugar less than 70).  Minette Brine, FNP Taking Active Self           Med Note Rolan Bucco Nov 02, 2022  2:40 PM) Did not pick up  hydrOXYzine (VISTARIL) 100 MG capsule [id number] Yes TAKE 1 CAPSULE BY MOUTH 3  TIMES DAILY AS NEEDED FOR  Gerrit Friends, FNP Taking Active Self  insulin aspart (NOVOLOG FLEXPEN) 100 UNIT/ML FlexPen [id number] No Max daily 30 units  Patient not taking: Reported on 11/13/2022   [name], Melanie Crazier, MD Not Taking Active Self  insulin isophane & regular human KwikPen (NOVOLIN 70/30 KWIKPEN) (70-30) 100 UNIT/ML KwikPen [id number]  Inject 12 Units into the skin 2 (two) times daily before a meal. Before breakfast and dinner Antonieta Pert, MD  Expired 11/02/22 2359   Insulin Pen Needle 31G X 5 MM MISC [id number] Yes 1 each by Does not apply route 2 (two) times daily for 100 doses. Antonieta Pert, MD Taking Active   Insulin Pen Needle 32G X 4 MM MISC [id number] Yes 1 each by Does not apply route 2 (two) times daily. Dx E11.9 Minette Brine, FNP Taking Active Self  vitamin E 180 MG (400 UNITS) capsule [id number] Yes Take 400 Units by mouth daily.  [provider] Taking Active Self            Patient Active Problem List   Diagnosis Date Noted   Acute renal failure superimposed on stage 3 chronic kidney disease ([place])    DKA (diabetic ketoacidosis) ([place]) 09/28/2022   Fall 08/17/2022   LOC (loss of consciousness) ([place])  08/17/2022   Slurred speech 08/17/2022   Type 1 diabetes mellitus with hyperglycemia ([place]) 11/11/2021   Malnutrition of moderate degree 09/09/2021   AMS (altered mental status) 09/09/2021   Acute encephalopathy 09/08/2021   Acute lower UTI 09/08/2021   Elevated LFTs 09/08/2021   Elevated lipase 09/08/2021   Hashimoto's disease 08/09/2021   Hypoglycemia 05/05/2021   AKI (acute kidney injury) ([place]) 05/05/2021   Anxiety    Chronic rhinitis 04/24/2019   Keloid 10/10/2018   Depression 01/02/2017   History of DVT (deep vein thrombosis) 01/02/2017    Chronic pain syndrome 01/02/2017   Cocaine abuse ([place]) 01/02/2017   Dysphagia    Adjustment insomnia    Distal radius fracture, right 08/16/2016   Postconcussion syndrome 12/30/2014   Tremor 03/11/2014   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   Hiatal hernia 03/21/2013   Anemia, iron deficiency 01/29/2013   Spasm of muscle 09/02/2012   Anemia 08/28/2012   Mild intermittent asthma 08/27/2012   Chronic back pain    Pernicious anemia 10/30/2011   DEGENERATIVE [place] DISEASE 08/18/2010   Asthma with bronchitis 07/28/2010   Recurrent urticaria 07/28/2010    Immunization History  Administered Date(s) Administered   Tdap 04/25/2016, 09/08/2021    Conditions to be addressed/monitored:  Hyperlipidemia, Diabetes, Asthma, Depression, and Anxiety  Care Plan : [place]  Updates made by Charlton Haws, [place] since 11/13/2022 12:00 AM     Problem: Hyperlipidemia, Diabetes, Asthma, Depression, and Anxiety   Priority: High     Long-Range Goal: Disease Management   Start Date: 11/02/2022  Expected End Date: 11/11/2023  Recent Progress: On track  Priority: High  Note:   Current Barriers:  Unable to achieve control of diabetes  Suboptimal therapeutic regimen for diabetes Does not contact provider office for questions/concerns  Pharmacist Clinical Goal(s):  Patient will achieve improvement in DM as evidenced by improved BG adhere to plan to optimize therapeutic regimen for DM as evidenced by report of adherence to recommended medication management changes contact provider office for questions/concerns as evidenced notation of same in electronic health record through collaboration with PharmD and provider.   Interventions: 1:1 collaboration with Michela Pitcher, NP regarding development and update of comprehensive plan of care as evidenced by provider attestation and co-signature Inter-disciplinary care team collaboration (see longitudinal plan of care) Comprehensive  medication review performed; medication list updated in electronic medical record   Diabetes - T1 (A1c goal <7%) -Uncontrolled - A1c 12.2% (06/2022), recent admission for DKA 09/28/22 - per discussion with patient today, she was out of [place] for a few days prior to admission due to cost issues. She was discharged on Novolin 70/30 mainly for lower cost. Since being home she has used both [place] and Novolin 70/30 in same day (but not often/not every day) -DM Dx 2021. Dx changed from T2 to T1 based on elevated islet cell Ab titers. Follows with endocrine (Dr Kelton Pillar) -Completed [place] training today 11/13/22 -Current home glucose readings: checks 4-5x daily  Range: 152 -297  Low glucose: 44 last week -Current medications: Novolin 70/30 12 units BID w/ meals - Appropriate, Query Effective Tresiba U100 10 units daily - Query appropriate (in conjuction with Novolin 70/30) Novolog Flexpen SSI - not taking Gvoke Hypopen - not picked up Testing supplies -Medications previously tried: [place], glipizide, metformin, Tresiba -Current meal patterns:  breakfast: egg and cheese biscuit lunch: sandwich, or a hot dog bun with sliced ham and mayonnaise with sugar free yogurt dinner: microwave ready green  beans, with red potatoes, and a sandwich snacks: glucerna, cottage cheese drinks: zero gatorade, diet cranberry juice, diet ginger ale; Drinking more water -Current exercise: not currently exercising -Educated on A1c and blood sugar goals; Complications of diabetes including kidney damage, retinal damage, and cardiovascular disease; Prevention and management of hypoglycemic episodes; -Reviewed benefits of CGM, insurance may cover this through DME -Reviewed BCBS FEP formulary: Basaglar, Levemir, Novolog are preferred insulins - will provide endocrine with this information -Recommend to avoid Tresiba and Novolin 70/30 in same day; advised to continue Novolin 70/30 per hospital instructions for  now; consider changing to [place] to restart Novolog sliding scale per hospital instructions -Ordered Freestyle Libre 3 to [employer] [facility]   Asthma / Allergies (Goal: control symptoms and prevent exacerbations) -Controlled -Spirometry 04/2019 - FEV1 99% predicted; FEV1/FVC 0.73 -Current treatment  Albuterol HFA PRN - Appropriate, Effective, Safe, Accessible Cetirizine 10 mg daily -Appropriate, Effective, Safe, Accessible Desloratadine 5 mg daily - Appropriate, Effective, Safe, Accessible Flonase PRN -Appropriate, Effective, Safe, Accessible Hydroxyzine 100 mg daily PRN itching -Appropriate, Effective, Safe, Accessible Famotidine 20 mg BID Appropriate, Effective, Safe, Accessible Epi-Pen PRN -Appropriate, Effective, Safe, Accessible -Exacerbations requiring treatment in last 6 months: 0 -Patient denies consistent use of maintenance inhaler -Frequency of rescue inhaler use: rare, none in the past four months  -Counseled on [place] inhaler technique; When to use rescue inhaler -Recommended to continue current medication   Hyperlipidemia: (LDL goal < 70) -Uncontrolled - LDL 83 (05/2020), due for repeat lipid panel -10-yr ASCVD risk 7.3% -Current treatment: None -Medications previously tried: none  -Educated on Cholesterol goals; Benefits of statin for ASCVD risk reduction; statin indication with diabetes -Recommend repeat lipid panel; consider statin inititation   Depression/Anxiety (Goal: manage symptoms) -Controlled -PHQ9: 0 (10/2021) -GAD7: n/a -Connected with Neurology for mental health support -Current treatment: Fluoxetine 10 mg daily - Appropriate, Effective, Safe, Accessible Diazepam 10 mg - 2 tab TID -Appropriate, Effective, Safe, Accessible Hydroxyzine 100 mg daily PRN itching -Appropriate, Effective, Safe, Accessible -Medications previously tried/failed: n/a -Educated on Benefits of medication for symptom control -Recommended to continue current  medication  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, Dr Leta Baptist -Current treatment  Baclofen 20 mg TID -Appropriate, Effective, Safe, Accessible Gabapentin 300 mg BID -Appropriate, Effective, Safe, Accessible Diazepam 10 mg - 2 tab TID -Appropriate, Effective, Safe, Accessible -Medications previously tried: n/a  -Recommended to continue current medication   GERD (Goal: minimize symptoms of reflux ) -Controlled -Current treatment  Famotidine 20 mg BID -Appropriate, Effective, Safe, Accessible -Medications previously tried: none reported  -Hx of Bleeds/ulcers: No -Recommended to continue current medication   Health Maintenance -Vaccine gaps: Shingrix -Medication management: most meds come through workers comp, but they won't pay for diabetic meds -Current therapy:  Vitamin C 1000 mg Vitamin D 400 IU Vitamin E 400 IU Cranberry - Vitamin C - Vitamin E    Patient Goals/Self-Care Activities Patient will:  - take medications as prescribed as evidenced by patient report and record review focus on medication adherence by routine check glucose 3x daily, document, and provide at future appointments     Medication Assistance: None required.  Patient affirms current coverage meets needs.  Compliance/Adherence/Medication fill history: Care Gaps: Eye exam Foot exam Mammogram AWV  Star-Rating Drugs: None  Medication Access: Within the past 30 days, how often has patient missed a dose of medication? 0 Is a pillbox or other method used to improve adherence? Yes  Factors that may affect medication adherence? financial  need Are meds synced by current pharmacy? No  Are meds delivered by current pharmacy? Yes  Does patient experience delays in picking up medications due to transportation concerns? No   [facility] Reviewed: Is patient disadvantaged to use [facility]?: Yes  Current Rx insurance plan: BCBS FEP Name and location of Current  pharmacy:  [id number] [place] [address]Phone: [phone]: [phone] [address] [address]Phone: [phone]: [address]Phone: [phone]: [phone] [facility] services reviewed with patient today?: No  Patient requests to transfer care to [facility]?: No  Reason patient declined to change pharmacies: Disadvantaged due to insurance/mail order   Care Plan and Follow Up Patient Decision:  Patient agrees to Care Plan and Follow-up.  Plan: Telephone follow up appointment with care management team member scheduled for:  1 month  LCharlene Brooke PharmD, BPresence Central And Suburban Hospitals Network Dba Precence St Marys HospitalClinical Pharmacist LNoblePrimary Care at [phone]

## 2022-11-13 NOTE — Telephone Encounter (Signed)
Spoke with patient, she reports Sherri [name] sensor is giving her reading, currently it is reading 68 with straight arrow. Advised her to eat something with carbs in it and continue to monitor. Advised she check a finger stick glucose if senor readings do not match how she is feeling. She has not checked a finger stick yet. Advised her to call back with any further issues. Pt voiced understanding of above.

## 2022-11-13 NOTE — Patient Instructions (Addendum)
Today we placed the Freestyle Libre 3 continuous glucose monitor (CGM) on your arm. It will track your sugar for you with new readings every 1-5 minutes.  Important points to remember about your CGM: -Any time the sugar reading does not reflect how you feel (particularly if it is telling you a very low reading), double check with a finger stick -The sensor is measuring the sugar in your skin, and a finger stick measures the sugar in your blood, and these numbers will probably never be exactly the same. This is normal. -The system will prompt you to change the sensor after approximately 14 days. -The sensor has to be removed prior to CT scans or MRIs. Try not to apply a new sensor if you know you have a scan coming up. -If the sensor falls off early, it cannot be re-applied. A new sensor should be applied.  With any sensor malfunctions, call Freestyle: [phone] For sensor refills, contact [employer]: [phone]   Insulin - Insurance preferences (share with Endocrinologist) Hospital doctor Levemir Novolog   Continue Novolog correctional insulin: ADD extra units on insulin to your meal-time Novolog  dose if your blood sugars are higher than 180. Use the scale below to help guide you:    Blood sugar before meal Number of units to inject  Less than 190 0 unit  191 - 250 1 units  251- 310 2 units  311 - 370 3 units  371 - 430 4 units  431 - 490 5 units  491 - 550 6 units  551 - 610 7 units     Al Corpus, PharmD, [employer] Clinical Pharmacist  Primary Care at [facility] [phone]

## 2022-11-20 ENCOUNTER — Other Ambulatory Visit: Payer: Self-pay | Admitting: Nurse Practitioner

## 2022-11-20 ENCOUNTER — Ambulatory Visit (INDEPENDENT_AMBULATORY_CARE_PROVIDER_SITE_OTHER): Payer: Medicare Other | Admitting: Nurse Practitioner

## 2022-11-20 VITALS — BP 102/78 | HR 81 | Temp 98.9°F | Resp 14 | Ht 63.0 in | Wt 119.2 lb

## 2022-11-20 DIAGNOSIS — N179 Acute kidney failure, unspecified: Secondary | ICD-10-CM | POA: Diagnosis not present

## 2022-11-20 DIAGNOSIS — J4520 Mild intermittent asthma, uncomplicated: Secondary | ICD-10-CM | POA: Diagnosis not present

## 2022-11-20 DIAGNOSIS — E1065 Type 1 diabetes mellitus with hyperglycemia: Secondary | ICD-10-CM | POA: Diagnosis not present

## 2022-11-20 DIAGNOSIS — Z09 Encounter for follow-up examination after completed treatment for conditions other than malignant neoplasm: Secondary | ICD-10-CM

## 2022-11-20 MED ORDER — FLUTICASONE-SALMETEROL 250-50 MCG/ACT IN AEPB: 1.0000 | INHALATION_SPRAY | Freq: Two times a day (BID) | RESPIRATORY_TRACT | 5 refills | Status: DC

## 2022-11-20 NOTE — Telephone Encounter (Signed)
Need to follow up during OV today. Last note on 10/23/22 stated patient uses Albuterol inhaler daily. Should be as needed. Last filled on 10/25/22

## 2022-11-20 NOTE — Assessment & Plan Note (Signed)
Patient is using albuterol inhaler every other day to several times daily.  Used to be on maintenance inhaler in the past.  Will start patient on Advair 1 puff twice daily, rinse mouth after inhaler use.  Patient updated via MyChart.  Will declined albuterol refill today as patient has some at home.  We will monitor patient's albuterol use in regards to having albuterol refill request.

## 2022-11-20 NOTE — Assessment & Plan Note (Signed)
Patient recently hospitalized for DKA.  Insulin regimen changed Novolin 70/30 twice daily dosing.  Patient has follow-up appointment with Dr.[name] on 11/28/2022.  Patient does have a CGM and traditional meter.

## 2022-11-20 NOTE — Assessment & Plan Note (Signed)
Did review discharge summary and recommendations from hospitalization.  Recheck labs today follow-up with endocrinology as scheduled.

## 2022-11-20 NOTE — Patient Instructions (Addendum)
Nice to see you today I have sent in an inhaler that you use daily. Rinse your mouth out after. Message me on mychart to let me know how you are doing with it Follow up in 6 months with me for your physical , sooner if you need me

## 2022-11-20 NOTE — Progress Notes (Signed)
Established Patient Office Visit  Subjective   Patient ID: Sherri [name], female    DOB: 1966-03-27  Age: 56 y.o. MRN: [id number]  Chief Complaint  Patient presents with   Follow-up    3 months on chronic condition    HPI  Asthma: States that she does use albuterol twice a day or twice every other day. States that she use to be on adviar and was taken off the inhaler. States that she doesn't  have PND  States that she has a hard time going to sleep. States that she will get up around 4am and got back to sleep until 8am. States that she will nap during the day  DM1: States that she does have a CGM and it is beeping. States that she had a reading that was 54 and then checked with a meter and was 94. She is followed by Dr. Lonzo Cloud.  Hospital follow-up: Patient was admitted on 09/28/2022 with DKA.  Patient was discharged home 10/03/2022.  She was switched from long-acting insulin to Novolin 70/30.  Patient does have a CGM along with traditional meter.  She is followed by Dr. Lonzo Cloud.  Patient did have decreased renal function result secondary to likely dehydration from DKA.  Patient does have a follow-up with endocrinology on 11/28/2022.     Review of Systems  Constitutional:  Negative for chills and fever.  Respiratory:  Negative for shortness of breath.   Cardiovascular:  Negative for chest pain.      Objective:     BP 102/78   Pulse 81   Temp 98.9 F (37.2 C)   Resp 14   Ht 5\' 3"  (1.6 m)   Wt 119 lb 4 oz (54.1 kg)   LMP 07/29/2012 (Approximate)   SpO2 99%   BMI 21.12 kg/m    Physical Exam Vitals and nursing note reviewed.  Constitutional:      Appearance: Normal appearance.  Cardiovascular:     Rate and Rhythm: Normal rate and regular rhythm.     Heart sounds: Normal heart sounds.  Pulmonary:     Effort: Pulmonary effort is normal.     Breath sounds: Normal breath sounds.  Neurological:     Mental Status: She is alert.      No results found for  any visits on 11/20/22.    The 10-year ASCVD risk score (Arnett DK, et al., 2019) is: 6.9%    Assessment & Plan:   Problem List Items Addressed This Visit       Respiratory   Mild intermittent asthma - Primary    Patient is using albuterol inhaler every other day to several times daily.  Used to be on maintenance inhaler in the past.  Will start patient on Advair 1 puff twice daily, rinse mouth after inhaler use.  Patient updated via MyChart.  Will declined albuterol refill today as patient has some at home.  We will monitor patient's albuterol use in regards to having albuterol refill request.      Relevant Medications   fluticasone-salmeterol (ADVAIR DISKUS) 250-50 MCG/ACT AEPB     Endocrine   Type 1 diabetes mellitus with hyperglycemia [facility])    Patient recently hospitalized for DKA.  Insulin regimen changed Novolin 70/30 twice daily dosing.  Patient has follow-up appointment with Dr.[name] on 11/28/2022.  Patient does have a CGM and traditional meter.      Relevant Orders   CBC   Comprehensive metabolic panel     Genitourinary   AKI (acute  kidney injury) [facility])   Relevant Orders   CBC   Comprehensive metabolic panel     Other   Hospital discharge follow-up    Did review discharge summary and recommendations from hospitalization.  Recheck labs today follow-up with endocrinology as scheduled.       Return in about 6 months (around 05/21/2023) for CPE and Labs.    Audria Nine, NP

## 2022-11-21 LAB — COMPREHENSIVE METABOLIC PANEL
ALT: 22 U/L (ref 0–35)
AST: 22 U/L (ref 0–37)
Albumin: 4.2 g/dL (ref 3.5–5.2)
Alkaline Phosphatase: 85 U/L (ref 39–117)
BUN: 29 mg/dL — ABNORMAL HIGH (ref 6–23)
CO2: 29 mEq/L (ref 19–32)
Calcium: 9.6 mg/dL (ref 8.4–10.5)
Chloride: 103 mEq/L (ref 96–112)
Creatinine, Ser: 1.39 mg/dL — ABNORMAL HIGH (ref 0.40–1.20)
GFR: 41.91 mL/min — ABNORMAL LOW (ref >60.00)
Glucose, Bld: 128 mg/dL — ABNORMAL HIGH (ref 70–99)
Potassium: 4.4 mEq/L (ref 3.5–5.1)
Sodium: 140 mEq/L (ref 135–145)
Total Bilirubin: 0.3 mg/dL (ref 0.2–1.2)
Total Protein: 7.3 g/dL (ref 6.0–8.3)

## 2022-11-21 LAB — CBC
HCT: 34.5 % — ABNORMAL LOW (ref 36.0–46.0)
Hemoglobin: 11.3 g/dL — ABNORMAL LOW (ref 12.0–15.0)
MCHC: 32.8 g/dL (ref 30.0–36.0)
MCV: 88.6 fl (ref 78.0–100.0)
Platelets: 247 10*3/uL (ref 150.0–400.0)
RBC: 3.9 Mil/uL (ref 3.87–5.11)
RDW: 14.9 % (ref 11.5–15.5)
WBC: 6.6 10*3/uL (ref 4.0–10.5)

## 2022-11-23 DIAGNOSIS — E1065 Type 1 diabetes mellitus with hyperglycemia: Secondary | ICD-10-CM

## 2022-11-23 DIAGNOSIS — J4520 Mild intermittent asthma, uncomplicated: Secondary | ICD-10-CM

## 2022-11-27 ENCOUNTER — Telehealth: Payer: Self-pay | Admitting: Nurse Practitioner

## 2022-11-27 NOTE — Telephone Encounter (Signed)
Spoke with patient, she had questions about replacing the [employer] sensor. Discussed how to remove current sensor and place replacement sensor on the back of the arm, then scan with reader to connect it. Pt also has appt with endocrine tomorrow so if she has any issues she can bring up then. Pt voiced understanding of above.

## 2022-11-27 NOTE — Telephone Encounter (Signed)
Patient called and stated she has a Libre 3 and stated it comes off in 5 hours and asked she has the new one and wanted to know how to put it on. Call back number [phone].

## 2022-11-28 ENCOUNTER — Encounter: Payer: Self-pay | Admitting: Internal Medicine

## 2022-11-28 ENCOUNTER — Ambulatory Visit (INDEPENDENT_AMBULATORY_CARE_PROVIDER_SITE_OTHER): Payer: Medicare Other | Admitting: Internal Medicine

## 2022-11-28 VITALS — BP 128/80 | HR 86 | Ht 63.0 in | Wt 118.2 lb

## 2022-11-28 DIAGNOSIS — N1831 Chronic kidney disease, stage 3a: Secondary | ICD-10-CM | POA: Diagnosis not present

## 2022-11-28 DIAGNOSIS — E063 Autoimmune thyroiditis: Secondary | ICD-10-CM

## 2022-11-28 DIAGNOSIS — E1021 Type 1 diabetes mellitus with diabetic nephropathy: Secondary | ICD-10-CM | POA: Diagnosis not present

## 2022-11-28 DIAGNOSIS — E1065 Type 1 diabetes mellitus with hyperglycemia: Secondary | ICD-10-CM

## 2022-11-28 DIAGNOSIS — E1022 Type 1 diabetes mellitus with diabetic chronic kidney disease: Secondary | ICD-10-CM | POA: Diagnosis not present

## 2022-11-28 LAB — POCT GLYCOSYLATED HEMOGLOBIN (HGB A1C): Hemoglobin A1C: 8.9 % — AB (ref 4.0–5.6)

## 2022-11-28 MED ORDER — INSULIN PEN NEEDLE 32G X 4 MM MISC: 1.0000 | Freq: Two times a day (BID) | 3 refills | Status: DC

## 2022-11-28 MED ORDER — NOVOLIN 70/30 FLEXPEN RELION (70-30) 100 UNIT/ML ~~LOC~~ SUPN: PEN_INJECTOR | SUBCUTANEOUS | 3 refills | Status: DC

## 2022-11-28 NOTE — Progress Notes (Signed)
Name: Sherri [name]  MRN/ DOB: [id number], Aug 12, 1966   Age/ Sex: 56 y.o., female    PCP: Eden Emms, NP   Reason for Endocrinology Evaluation: Type 2 Diabetes Mellitus     Date of Initial Endocrinology Visit: 08/08/2021    PATIENT IDENTIFIER: [name] is a 56 y.o. female with a past medical history of T2DM, CKD, hx of coccaine abuse, depression, DVT . The patient presented for initial endocrinology clinic visit on 08/08/2021 for consultative assistance with her diabetes management.     DIABETIC HISTORY:  Sherri [name] was diagnosed with T2DM in 2021, she was initially started on oral glycemic agents, but subsequently required insulin due to persistent hyperglycemia.Her hemoglobin A1c has ranged from  6.4% in 2021, peaking at 13.0% in 2022.  On her initial visit to our clinic she had an A1c of 12%, she was on Farxiga, metformin, glipizide, Tresiba, and metformin.  We stopped metformin and glipizide, continued [place] and Farxiga, and started prandial insulin  Her diagnosis was changed from type 2 DM to type I DM based on elevated islet cell antibody titers at 5120 and GAD-65 > 250   Was diagnosed with stiff person syndrome in 2021, follows with Dr. Anne Hahn She was also found to have en elevated Anti-TPO Ab's at 37 IU/Ml ( reference 0-34)    She had a DKA admission in November 2023 and her insulin regimen was changed from multiple daily injections to insulin mix  SUBJECTIVE:   During the last visit (07/07/2022): A1c 12.2%   Today (11/28/22): Sherri [name] is here for follow-up on diabetes management.  She checks her blood sugars multiple times daily through [employer]. The patient has had hypoglycemic episodes since the last clinic visit.  She had an ED for DKA 09/28/2022, she was discharged on the insulin mix which she seems to be doing well with except that she has been noted with recurrent hypoglycemia on freestyle libre download  Denies nausea, vomiting or diarrhea     HOME DIABETES REGIMEN: Novolin 70/30 12 units QAM and 12 units before Supper      Statin: no ACE-I/ARB: no    CONTINUOUS GLUCOSE MONITORING RECORD INTERPRETATION    Dates of Recording: 11/22-/11/28/2022  Sensor description:freestyle libre   Results statistics:   CGM use % of time 97  Average and SD 181/51.2  Time in range      42  %  % Time Above 180 21  % Time above 250 24  % Time Below target 10   Glycemic patterns summary: Bg's optimal at night and high during the day   Hyperglycemic episodes  during the day  Hypoglycemic episodes occurred after a bolus   Overnight periods:  trends down    DIABETIC COMPLICATIONS: Microvascular complications:  CKD III, neuropathy Denies: retinopathy Last eye exam: Completed > 2 yrs ago   Macrovascular complications:   Denies: CAD, PVD, CVA   PAST HISTORY: Past Medical History:  Past Medical History:  Diagnosis Date   Angioedema    Anxiety    Arthritis    Asthma    Benzodiazepine withdrawal (HCC) 08/30/2012   CAP (community acquired pneumonia) 08/29/2012   Chronic back pain    Chronic kidney disease    Closed nondisplaced fracture of proximal phalanx of right little finger 10/18/2018   Cocaine abuse (HCC)    Depression    Diarrhea    DVT (deep venous thrombosis) (HCC)    Environmental allergies    Fall  GERD (gastroesophageal reflux disease)    Heart murmur    has been told once that she has a heart murmur, but has never had any problems   Hiatal hernia 03/21/2013   Lumbar herniated disc    Peripheral vascular disease (HCC)    Pernicious anemia 10/30/2011   Postconcussion syndrome 12/30/2014   Rhabdomyolysis 08/27/2012   Seasonal allergies    Stiff person syndrome    Urticaria    Past Surgical History:  Past Surgical History:  Procedure Laterality Date   BUNIONECTOMY Left    colonoscopy  07/09/15   inguinal hernia 1983  1983   OPEN REDUCTION INTERNAL FIXATION (ORIF) DISTAL RADIAL FRACTURE Right  08/16/2016   Procedure: OPEN REDUCTION INTERNAL FIXATION (ORIF) DISTAL RADIAL FRACTURE;  Surgeon: Eldred Manges, MD;  Location: [facility] OR;  Service: Orthopedics;  Laterality: Right;    Social History:  reports that she has been smoking cigars. She has never used smokeless tobacco. She reports that she does not currently use alcohol. She reports that she does not currently use drugs after having used the following drugs: Cocaine. Family History:  Family History  Problem Relation Age of Onset   Diabetes Mother    Cancer Mother 17       myloma   Other Mother        pernicious anemia   COPD Father    Transient ischemic attack Father    Asthma Brother    Cancer Brother 83       colon   Heart attack Brother    Seizures Brother    Allergic rhinitis Neg Hx    Angioedema Neg Hx    Eczema Neg Hx    Immunodeficiency Neg Hx    Urticaria Neg Hx      HOME MEDICATIONS: Allergies as of 11/28/2022       Reactions   Ibuprofen Other (See Comments)   Does not take due to hx of renal insufficiency "I have kidney disease"    Lemon Flavor Swelling   Severe Lip Swelling  FRUIT per pt.     Amoxicillin Diarrhea, Other (See Comments)   Tylenol [acetaminophen] Hives   Cannot take large quantities   Dust Mite Extract Hives   Other reaction(s): Other (See Comments) (as well as paper mites)   Molds & Smuts Hives, Swelling   Other reaction(s): Other (See Comments) Face swelling        Medication List        Accurate as of November 28, 2022 11:52 AM. If you have any questions, ask your nurse or doctor.          Accu-Chek SmartView test strip Generic drug: glucose blood USE AS INSTRUCTED TO CHECK SUGARS BEFORE MEALS AND AT BEDTIME   albuterol 108 (90 Base) MCG/ACT inhaler Commonly known as: VENTOLIN HFA Inhale 2 puffs into the lungs every 6 (six) hours as needed for wheezing or shortness of breath.   baclofen 20 MG tablet Commonly known as: LIORESAL TAKE 1 TABLET BY MOUTH 3  TIMES DAILY    cetirizine 10 MG tablet Commonly known as: ZyrTEC Allergy Take 1 tablet (10 mg total) by mouth daily.   Cranberry-Vitamin C-Vitamin E 4200-20-3 MG-MG-UNIT Caps Take 1 tablet by mouth daily.   desloratadine 5 MG tablet Commonly known as: CLARINEX TAKE 1 TABLET BY MOUTH DAILY   diazepam 10 MG tablet Commonly known as: VALIUM TAKE 2 TABLETS BY MOUTH IN THE  MORNING, AT NOON, AND AT BEDTIME   EPINEPHrine 0.3 mg/0.3  mL Soaj injection Commonly known as: EPI-PEN INJECT INTRAMUSCULARLY 1 PEN AS  NEEDED FOR ALLERGIC RESPONSE AS  DIRECTED BY MD. SEEK MEDICAL  HELP AFTER USE.   famotidine 20 MG tablet Commonly known as: PEPCID TAKE 1 TABLET BY MOUTH  TWICE DAILY   FLUoxetine 10 MG capsule Commonly known as: PROZAC TAKE 1 CAPSULE BY MOUTH DAILY   fluticasone 50 MCG/ACT nasal spray Commonly known as: FLONASE Place 2 sprays into both nostrils daily.   fluticasone-salmeterol 250-50 MCG/ACT Aepb Commonly known as: Advair Diskus Inhale 1 puff into the lungs in the morning and at bedtime. Rinse mouth out after each use   gabapentin 600 MG tablet Commonly known as: NEURONTIN Take 600 mg by mouth 2 (two) times daily.   gabapentin 300 MG capsule Commonly known as: NEURONTIN TAKE 1 CAPSULE BY MOUTH TWICE  DAILY   hydrOXYzine 100 MG capsule Commonly known as: VISTARIL TAKE 1 CAPSULE BY MOUTH 3  TIMES DAILY AS NEEDED FOR  ITCHING   Insulin Pen Needle 32G X 4 MM Misc 1 each by Does not apply route 2 (two) times daily. Dx E11.9   NovoLIN 70/30 Kwikpen (70-30) 100 UNIT/ML KwikPen Generic drug: insulin isophane & regular human KwikPen Inject 12 Units into the skin 2 (two) times daily before a meal. Before breakfast and dinner   NovoLOG FlexPen 100 UNIT/ML FlexPen Generic drug: insulin aspart Max daily 30 units   vitamin C 1000 MG tablet TAKE 1 TABLET BY MOUTH EVERY DAY   Vitamin D3 10 MCG (400 UNIT) tablet Take 400 Units by mouth daily.   vitamin E 180 MG (400 UNITS)  capsule Take 400 Units by mouth daily.         ALLERGIES: Allergies  Allergen Reactions   Ibuprofen Other (See Comments)    Does not take due to hx of renal insufficiency "I have kidney disease"    Lemon Flavor Swelling    Severe Lip Swelling  FRUIT per pt.     Amoxicillin Diarrhea and Other (See Comments)   Tylenol [Acetaminophen] Hives    Cannot take large quantities   Dust Mite Extract Hives    Other reaction(s): Other (See Comments) (as well as paper mites)   Molds & Smuts Hives and Swelling    Other reaction(s): Other (See Comments) Face swelling     REVIEW OF SYSTEMS: A comprehensive ROS was conducted with the patient and is negative except as per HPI    OBJECTIVE:   VITAL SIGNS: BP 128/80 (BP Location: Right Arm, Patient Position: Sitting, Cuff Size: Normal)   Pulse 86   Ht 5\' 3"  (1.6 m)   Wt 118 lb 3.2 oz (53.6 kg)   LMP 07/29/2012 (Approximate)   SpO2 98%   BMI 20.94 kg/m    PHYSICAL EXAM:  General: Pt appears well and is in NAD  Lungs: Clear with good BS bilat with no rales, rhonchi, or wheezes  Heart: RRR   Extremities:  Lower extremities - No pretibial edema. No lesions.  Neuro: MS is good with appropriate affect, pt is alert and Ox3   DM Foot Exam 03/07/2022  The skin of the feet is intact without sores or ulcerations. The pedal pulses are 2+ on right and 2+ on left. The sensation is intact to a screening 5.07, 10 gram monofilament bilaterally  DATA REVIEWED:  Lab Results  Component Value Date   HGBA1C 8.9 (A) 11/28/2022   HGBA1C 12.2 (A) 07/07/2022   HGBA1C 11.3 (A) 03/07/2022  Latest Reference Range & Units 11/20/22 15:14  Sodium 135 - 145 mEq/L 140  Potassium 3.5 - 5.1 mEq/L 4.4  Chloride 96 - 112 mEq/L 103  CO2 19 - 32 mEq/L 29  Glucose 70 - 99 mg/dL 595128 (H)  BUN 6 - 23 mg/dL 29 (H)  Creatinine 6.380.40 - 1.20 mg/dL 7.561.39 (H)  Calcium 8.4 - 10.5 mg/dL 9.6  Alkaline Phosphatase 39 - 117 U/L 85  Albumin 3.5 - 5.2 g/dL 4.2   AST 0 - 37 U/L 22  ALT 0 - 35 U/L 22  Total Protein 6.0 - 8.3 g/dL 7.3    Latest Reference Range & Units 08/17/22 10:55  TSH 0.35 - 5.50 uIU/mL 0.91    Results for Laurena SlimmerMCADOO, Danaysha (MRN [id number]) as of 11/09/2021 12:49  Ref. Range 08/08/2021 10:58  ISLET CELL ANTIBODY SCREEN Latest Ref Range: NEGATIVE  POSITIVE (A)   Glutamic Acid Decarb Ab <5 IU/mL >250 High     ASSESSMENT / PLAN / RECOMMENDATIONS:   1) Type 1 Diabetes Mellitus, Poorly controlled, With CKD III and neuropathic  complications - Most recent A1c of 8.9%. Goal A1c < 7.0 %.    - A1c down 12.2% -I have praised the patient improved glycemic control and making her A1c down to <10.0%, but she understands that she still has ways to go to improve her A1c to 7% or better - Pt with multiple social determinants -Today she was educated about rule of 15 with hypoglycemia.  We again discussed the importance of taking insulin before eating.  For example this morning her BG was in the 50s, she did not take her insulin but ate breakfast and a postprandial BG was close to 300 mg/DL -I did explain to the patient that in the future she needs to use the rule of 15 for hypoglycemia, and once her BG reading is > 70 mg/dL, then she should take her insulin and eat -She was advised that the best way to take this insulin is 20 minutes before the meal and to avoid taking it after the meal as that will increase the risk of hypoglycemia   MEDICATIONS:  -Novolin 70/30 12 units before breakfast, and decreased to 10 units before supper    EDUCATION / INSTRUCTIONS: BG monitoring instructions: Patient is instructed to check her blood sugars 3 times a day, before meals. Call [facility] if: BG persistently < 70  I reviewed the Rule of 15 for the treatment of hypoglycemia in detail with the patient. Literature supplied.   2) Diabetic complications:  Eye: Does not have known diabetic retinopathy.  Neuro/ Feet: Does  have known  diabetic peripheral neuropathy. Renal: Patient does  have known baseline CKD. She is not on an ACEI/ARB at present.   3) Hashimoto's Disease:   - TSH normal 07/2022 -Will continue to monitor  F/U in 4 months      Signed electronically by: Lyndle HerrlichAbby Jaralla Tywone Bembenek, MD  [facility]  [facility] [address] [place], [place] [address] Phone: [phone] FAX: [phone]   CC: Eden EmmsCable, James M, NP [address] [place] [address] Phone: [phone]  Fax: [phone]    Return to Endocrinology clinic as below: Future Appointments  Date Time Provider Department Center  12/08/2022  1:45 PM [facility] CCM PHARMACIST [facility]  03/09/2023  1:00 PM [facility] LAB [facility] None  03/09/2023  1:15 PM Erenest Blankarter, Sarah M, NP [facility] None  03/09/2023  1:45 PM [facility] INJ NURSE [facility]  None  07/12/2023  2:15 PM Glean Salvo, NP [facility] None

## 2022-11-28 NOTE — Patient Instructions (Signed)
Novolin 70/30 take 12 units Before Breakfast and 10 units before Supper     HOW TO TREAT LOW BLOOD SUGARS (Blood sugar LESS THAN 70 MG/DL) Please follow the RULE OF 15 for the treatment of hypoglycemia treatment (when your (blood sugars are less than 70 mg/dL)   STEP 1: Take 15 grams of carbohydrates when your blood sugar is low, which includes:  3-4 GLUCOSE TABS  OR 3-4 OZ OF JUICE OR REGULAR SODA OR ONE TUBE OF GLUCOSE GEL    STEP 2: RECHECK blood sugar in 15 MINUTES STEP 3: If your blood sugar is still low at the 15 minute recheck --> then, go back to STEP 1 and treat AGAIN with another 15 grams of carbohydrates.

## 2022-12-05 ENCOUNTER — Telehealth: Payer: Self-pay

## 2022-12-05 NOTE — Progress Notes (Signed)
    Chronic Care Management Pharmacy Assistant   Name: Mio Schellinger  MRN: [id number] DOB: July 31, 1966  Reason for Encounter: CCM (Appointment Reminder)  Medications: Outpatient Encounter Medications as of 12/05/2022  Medication Sig   ACCU-CHEK SMARTVIEW test strip USE AS INSTRUCTED TO CHECK SUGARS BEFORE MEALS AND AT BEDTIME   albuterol (VENTOLIN HFA) 108 (90 Base) MCG/ACT inhaler Inhale 2 puffs into the lungs every 6 (six) hours as needed for wheezing or shortness of breath.   Ascorbic Acid (VITAMIN C) 1000 MG tablet TAKE 1 TABLET BY MOUTH EVERY DAY   baclofen (LIORESAL) 20 MG tablet TAKE 1 TABLET BY MOUTH 3  TIMES DAILY   cetirizine (ZYRTEC ALLERGY) 10 MG tablet Take 1 tablet (10 mg total) by mouth daily.   Cholecalciferol (VITAMIN D3) 10 MCG (400 UNIT) tablet Take 400 Units by mouth daily.   Cranberry-Vitamin C-Vitamin E 4200-20-3 MG-MG-UNIT CAPS Take 1 tablet by mouth daily.   desloratadine (CLARINEX) 5 MG tablet TAKE 1 TABLET BY MOUTH DAILY   diazepam (VALIUM) 10 MG tablet TAKE 2 TABLETS BY MOUTH IN THE  MORNING, AT NOON, AND AT BEDTIME   EPINEPHRINE 0.3 mg/0.3 mL IJ SOAJ injection INJECT INTRAMUSCULARLY 1 PEN AS  NEEDED FOR ALLERGIC RESPONSE AS  DIRECTED BY MD. SEEK MEDICAL  HELP AFTER USE.   famotidine (PEPCID) 20 MG tablet TAKE 1 TABLET BY MOUTH  TWICE DAILY   FLUoxetine (PROZAC) 10 MG capsule TAKE 1 CAPSULE BY MOUTH DAILY   fluticasone (FLONASE) 50 MCG/ACT nasal spray Place 2 sprays into both nostrils daily.   fluticasone-salmeterol (ADVAIR DISKUS) 250-50 MCG/ACT AEPB Inhale 1 puff into the lungs in the morning and at bedtime. Rinse mouth out after each use   gabapentin (NEURONTIN) 300 MG capsule TAKE 1 CAPSULE BY MOUTH TWICE  DAILY   gabapentin (NEURONTIN) 600 MG tablet Take 600 mg by mouth 2 (two) times daily.   hydrOXYzine (VISTARIL) 100 MG capsule TAKE 1 CAPSULE BY MOUTH 3  TIMES DAILY AS NEEDED FOR  ITCHING   insulin isophane & regular human KwikPen (NOVOLIN 70/30 KWIKPEN)  (70-30) 100 UNIT/ML KwikPen Inject 12 Units into the skin daily before breakfast AND 10 Units daily before supper. Before breakfast and dinner.   Insulin Pen Needle 32G X 4 MM MISC 1 each by Does not apply route 2 (two) times daily. Dx E11.9   vitamin E 180 MG (400 UNITS) capsule Take 400 Units by mouth daily.    No facility-administered encounter medications on file as of 12/05/2022.   Jayra Choyce was contacted to remind of upcoming telephone visit with Al Corpus on 12/08/2022 at 1:45. Patient was reminded to have any blood glucose and blood pressure readings available for review at appointment.   Message was left reminding patient of appointment.  CCM referral has been placed prior to visit?  Yes   Star Rating Drugs: Medication:  Last Fill: Day Supply None noted  Al Corpus, CPP notified  Claudina Lick, [place] Clinical Pharmacy Assistant [phone]

## 2022-12-08 ENCOUNTER — Ambulatory Visit: Payer: Medicare Other | Admitting: Pharmacist

## 2022-12-08 ENCOUNTER — Telehealth: Payer: Self-pay | Admitting: Pharmacist

## 2022-12-08 DIAGNOSIS — E1065 Type 1 diabetes mellitus with hyperglycemia: Secondary | ICD-10-CM

## 2022-12-08 DIAGNOSIS — J4520 Mild intermittent asthma, uncomplicated: Secondary | ICD-10-CM

## 2022-12-08 DIAGNOSIS — N289 Disorder of kidney and ureter, unspecified: Secondary | ICD-10-CM

## 2022-12-08 NOTE — Patient Instructions (Signed)
Visit Information  Phone number for Pharmacist: [phone]   Goals Addressed   None     Care Plan : [facility]CM Pharmacy Care Plan  Updates made by Kathyrn Sheriff, RPH since 12/08/2022 12:00 AM     Problem: Hyperlipidemia, Diabetes, Asthma, Depression, and Anxiety   Priority: High     Long-Range Goal: Disease Management   Start Date: 11/02/2022  Expected End Date: 11/11/2023  This Visit's Progress: On track  Recent Progress: On track  Priority: High  Note:   Current Barriers:  Unable to achieve control of diabetes  Suboptimal therapeutic regimen for diabetes Does not contact provider office for questions/concerns  Pharmacist Clinical Goal(s):  Patient will achieve improvement in DM as evidenced by improved BG adhere to plan to optimize therapeutic regimen for DM as evidenced by report of adherence to recommended medication management changes contact provider office for questions/concerns as evidenced notation of same in electronic health record through collaboration with PharmD and provider.   Interventions: 1:1 collaboration with Eden Emms, NP regarding development and update of comprehensive plan of care as evidenced by provider attestation and co-signature Inter-disciplinary care team collaboration (see longitudinal plan of care) Comprehensive medication review performed; medication list updated in electronic medical record   Diabetes - T1 (A1c goal <7%) -Uncontrolled, improving - A1c 8.9% (11/2022) improved from >12%;  -recent admission for DKA 09/28/22 - per discussion with patient , she was out of [place] for a few days prior to admission due to cost issues. She was discharged on Novolin 70/30 mainly for lower cost -DM Dx 2021. Dx changed from T2 to T1 based on elevated islet cell Ab titers. Follows with endocrine (Dr Lonzo Cloud) -Completed [employer] training 11/13/22 -Current home glucose readings: Freestyle Libre w/ reader; pt unable to give average/TIR  over the phone; she reports BG is 193 right now; she reports sugar is still "up and down" with low sugars  -Current medications: Novolin 70/30 12 units AM, 10 units PM - Appropriate, Query Effective Gvoke Hypopen - not picked up [employer] 3 w/ reader - Appropriate, Effective, Safe, Accessible Testing supplies -Medications previously tried: Farxiga, glipizide, metformin, Tresiba -Current meal patterns:  breakfast: egg and cheese biscuit lunch: sandwich, or a hot dog bun with sliced ham and mayonnaise with sugar free yogurt dinner: microwave ready green beans, with red potatoes, and a sandwich snacks: glucerna, cottage cheese drinks: zero gatorade, diet cranberry juice, diet ginger ale; Drinking more water -Current exercise: not currently exercising -Educated on A1c and blood sugar goals; Complications of diabetes including kidney damage, retinal damage, and cardiovascular disease; Prevention and management of hypoglycemic episodes; -Reviewed [employer] FEP formulary: Basaglar, Levemir, Novolog are preferred insulins -Recommend to continue current medication   Asthma / Allergies (Goal: control symptoms and prevent exacerbations) -Controlled -Spirometry 04/2019 - FEV1 99% predicted; FEV1/FVC 0.73 -Current treatment  Albuterol HFA PRN - Appropriate, Effective, Safe, Accessible Cetirizine 10 mg daily -Appropriate, Effective, Safe, Accessible Desloratadine 5 mg daily - Appropriate, Effective, Safe, Accessible Flonase PRN -Appropriate, Effective, Safe, Accessible Hydroxyzine 100 mg daily PRN itching -Appropriate, Effective, Safe, Accessible Famotidine 20 mg BID Appropriate, Effective, Safe, Accessible Epi-Pen PRN -Appropriate, Effective, Safe, Accessible -Exacerbations requiring treatment in last 6 months: 0 -Patient denies consistent use of maintenance inhaler -Frequency of rescue inhaler use: rare, none in the past four months  -Counseled on Proper inhaler technique; When to use rescue  inhaler -Recommended to continue current medication   Hyperlipidemia: (LDL goal < 70) -Uncontrolled - LDL 83 (05/2020), due  for repeat lipid panel -10-yr ASCVD risk 7.3% -Current treatment: None -Medications previously tried: none  -Educated on Cholesterol goals; Benefits of statin for ASCVD risk reduction; statin indication with diabetes -Recommend repeat lipid panel; consider statin inititation   Depression/Anxiety (Goal: manage symptoms) -Controlled -PHQ9: 0 (10/2021) -GAD7: n/a -Connected with Neurology for mental health support -Current treatment: Fluoxetine 10 mg daily - Appropriate, Effective, Safe, Accessible Diazepam 10 mg - 2 tab TID -Appropriate, Effective, Safe, Accessible Hydroxyzine 100 mg daily PRN itching -Appropriate, Effective, Safe, Accessible -Medications previously tried/failed: n/a -Educated on Benefits of medication for symptom control -Recommended to continue current medication  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, Dr Marjory Lies -Current treatment  Baclofen 20 mg TID -Appropriate, Effective, Safe, Accessible Gabapentin 300 mg BID -Appropriate, Effective, Safe, Accessible Diazepam 10 mg - 2 tab TID -Appropriate, Effective, Safe, Accessible -Medications previously tried: n/a  -Recommended to continue current medication   GERD (Goal: minimize symptoms of reflux ) -Controlled -Current treatment  Famotidine 20 mg BID -Appropriate, Effective, Safe, Accessible -Medications previously tried: none reported  -Hx of Bleeds/ulcers: No -Recommended to continue current medication  Health Maintenance -Vaccine gaps: Shingrix -Medication management: most meds come through workers comp, but they won't pay for diabetic meds -Decline in GFR to 41 (11/20/22), pt was supposed to return in 1 week for repeat renal panel but did not; scheduled lab appt for 12/21 and coordinating with PCP for lab orders -Current therapy:  Vitamin C 1000 mg Vitamin D  400 IU Vitamin E 400 IU Cranberry - Vitamin C - Vitamin E  Patient Goals/Self-Care Activities Patient will:  - take medications as prescribed as evidenced by patient report and record review focus on medication adherence by routine check glucose 3x daily, document, and provide at future appointments      Patient verbalizes understanding of instructions and care plan provided today and agrees to view in MyChart. Active MyChart status and patient understanding of how to access instructions and care plan via MyChart confirmed with patient.    Telephone follow up appointment with pharmacy team member scheduled for: 1 month  Al Corpus, PharmD, [facility] Clinical Pharmacist [facility] [phone]

## 2022-12-08 NOTE — Telephone Encounter (Signed)
Lab orders placed inclusive of a lipid panel

## 2022-12-08 NOTE — Telephone Encounter (Signed)
Per chart review patient's kidney function declined 11/27 appt with PCP, and she needs repeat renal panel, she did not get blood drawn at her endocrine appt 12/5.  Scheduled lab appt for 12/14/22 (earliest available). Will need lab orders.  I also recommend to add a lipid panel - her last one was 05/2020, she is not on a statin with diabetes and lipid results will help guide decision.  Routing to PCP.

## 2022-12-08 NOTE — Progress Notes (Signed)
Chronic Care Management Pharmacy Note  12/08/2022 Name:  Sherri [name] MRN:  [id number] DOB:  February 07, 1966  Summary: CCM F/U visit -DM: A1c 8.9% (11/2022) improved from > 12%; she endorses compliance with Novolin 70/30 as prescribed; she is wearing Freestyle Libre and unable to give averages/TIR, but reports glucose is "up and down" and current glucose 193 -HLD: LDL 83 (05/2020); she is due for re-evaluation; statin is indicated given DM dx -GFR decline: GFR 41 on 11/20/22, she was meant to return in 1 week for repeat but did not  Recommendations/Changes made from today's visit: -No med changes -Scheduled lab appt 12/14/22 for repeat BMP and coordinating with PCP for orders -Recommend lipid panel to help guide statin decision  Plan: -Pharmacist follow up televisit scheduled for 1 month -Endocrine appt 04/01/23    Subjective: Sherri [name] is an 56 y.o. year old female who is a primary patient of Michela Pitcher, NP.  The CCM team was consulted for assistance with disease management and care coordination needs.    Engaged with patient by telephone for follow up visit in response to provider referral for pharmacy case management and/or care coordination services.   Consent to Services:  The patient was given information about Chronic Care Management services, agreed to services, and gave verbal consent prior to initiation of services.  Please see initial visit note for detailed documentation.   Patient Care Team: Michela Pitcher, NP as PCP - General (Nurse Practitioner) Charlton Haws, [facility] as Pharmacist (Pharmacist)  Recent office visits: 11/20/22 NP Romilda Garret OV: mild asthma - Rx Advair Diskus.  08/17/22 NP Matt Charmian Muff OV: establish care. Episode of slurred speech, falling and hitting her head. Stat CT ordered - no acute abnormality. LFT slightly elevated, recheck next OV. Other labs stable.   07/19/22 NP Minette Brine ([facility]): f/u  DM. Continue f/u with endo. Rx' [place].  Discharged from practice due to multiple no shows.  Recent consult visits: 11/28/22 Dr Kelton Pillar (Endocrine): A1c 8.9%; CGM download - Avg 181, TIR 42%, low 10%; counseled rule of 15s, take insulin 20 min before meal. Change to Novolin 70/30 12 u AM and 10 u PM. 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   07/07/22 Dr Kelton Pillar (Endocrine): f/u DM. Dx 2021. Dx changed from T2 to T1 based on elevated islet cell Ab titers. BG range 46 - 600. Advised correction scale with pre-meal glucose. CGM cost-prohibitive.  Hospital visits: 09/28/22 - 10/03/22 Admission ([facility]): DKA, ongoing noncompliance. Transitioned to 70/30 insulin 12 units BID- discontinue home insulin Tyler Aas)  Objective:  Lab Results  Component Value Date   CREATININE 1.39 (H) 11/20/2022   BUN 29 (H) 11/20/2022   GFR 41.91 (L) 11/20/2022   EGFR 52 (L) 07/19/2022   GFRNONAA >60 10/01/2022   GFRAA 59 (L) 01/25/2021   NA 140 11/20/2022   K 4.4 11/20/2022   CALCIUM 9.6 11/20/2022   CO2 29 11/20/2022   GLUCOSE 128 (H) 11/20/2022    Lab Results  Component Value Date/Time   HGBA1C 8.9 (A) 11/28/2022 11:50 AM   HGBA1C 12.2 (A) 07/07/2022 02:24 PM   HGBA1C 12.5 (H) 09/08/2021 03:30 PM   HGBA1C 13.0 (H) 05/06/2021 05:41 AM   GFR 41.91 (L) 11/20/2022 03:14 PM   GFR 55.00 (L) 08/17/2022 10:55 AM   MICROALBUR 55m 03/22/2021 11:39 AM   MICROALBUR 30 06/04/2020 12:29 PM    Last diabetic Eye exam: No results found for: "HMDIABEYEEXA"  Last diabetic Foot exam: No results found for: "HMDIABFOOTEX"   Lab Results  Component Value Date   CHOL 172 06/03/2020   HDL 75 06/03/2020   LDLCALC 83 06/03/2020   TRIG 76 06/03/2020   CHOLHDL 2.3 06/03/2020       Latest Ref Rng & Units 11/20/2022    3:14 PM 09/28/2022    4:41 PM 09/15/2022   11:35 AM  Hepatic Function  Total Protein 6.0 - 8.3 g/dL 7.3  7.4  7.1   Albumin 3.5 - 5.2 g/dL 4.2  4.1  4.2   AST 0 -  37 U/L _0 ALT 0 - 35 U/L 22  30  41   Alk Phosphatase 39 - 117 U/L 85  127  114   Total Bilirubin 0.2 - 1.2 mg/dL 0.3  1.5  0.4   Bilirubin, Direct 0.0 - 0.2 mg/dL  0.2      Lab Results  Component Value Date/Time   TSH 0.91 08/17/2022 10:55 AM   TSH 0.555 09/09/2021 01:42 AM   TSH 2.28 08/08/2021 10:58 AM   FREET4 0.94 08/08/2021 10:58 AM       Latest Ref Rng & Units 11/20/2022    3:14 PM 09/29/2022    4:35 AM 09/28/2022    3:27 PM  CBC  WBC 4.0 - 10.5 K/uL 6.6  9.7  11.8   Hemoglobin 12.0 - 15.0 g/dL 11.3  11.7  14.7   Hematocrit 36.0 - 46.0 % 34.5  34.4  48.3   Platelets 150.0 - 400.0 K/uL 247.0  192  245     Lab Results  Component Value Date/Time   VD25OH 74.0 12/26/2016 10:02 AM   VD25OH 58.8 08/24/2016 10:11 AM    Clinical ASCVD: No  The 10-year ASCVD risk score (Arnett DK, et al., 2019) is: 13.5%   Values used to calculate the score:     Age: 56 years     Sex: Female     Is Non-Hispanic African American: Yes     Diabetic: Yes     Tobacco smoker: Yes     Systolic Blood Pressure: 681 mmHg     Is BP treated: No     HDL Cholesterol: 75 mg/dL     Total Cholesterol: 172 mg/dL       11/10/2021    2:34 PM 07/14/2021   12:27 PM 06/03/2020    3:43 PM  Depression screen PHQ 2/9  Decreased Interest 0 0 0  Down, Depressed, Hopeless 0 0 0  PHQ - 2 Score 0 0 0  Altered sleeping   0  Tired, decreased energy   0  Change in appetite   0  Feeling bad or failure about yourself    0  Trouble concentrating   0  Moving slowly or fidgety/restless   0  Suicidal thoughts   0  PHQ-9 Score   0  Difficult doing work/chores   Not difficult at all     Social History   Tobacco Use  Smoking Status Every Day   Types: Cigars  Smokeless Tobacco Never  Tobacco Comments   Never Used Tobacco   BP Readings from Last 3 Encounters:  11/28/22 128/80  11/20/22 102/78  10/03/22 104/70   Pulse Readings from Last 3 Encounters:  11/28/22 86  11/20/22 81  10/03/22 74    Wt Readings from Last 3 Encounters:  11/28/22 118 lb 3.2 oz (53.6 kg)  11/20/22 119 lb 4 oz (54.1 kg)  10/03/22 102 lb 8.2 oz (46.5 kg)   BMI Readings from Last 3 Encounters:  11/28/22 20.94 kg/m  11/20/22 21.12 kg/m  10/03/22 18.16 kg/m    Assessment/Interventions: Review of patient past medical history, allergies, medications, health status, including review of consultants reports, laboratory and other test data, was performed as part of comprehensive evaluation and provision of chronic care management services.   SDOH:  (Social Determinants of Health) assessments and interventions performed: Yes  SDOH Interventions    Flowsheet Row Chronic Care Management from 11/02/2022 in [place] at [place] Management from 03/29/2022 in [place] Telephone from 06/30/2021 in [place] Management from 05/25/2021 in [place] Management from 02/07/2021 in [place] Management from 09/21/2020 in [facility]  SDOH Interventions        Food Insecurity Interventions -- Other (Comment)  [patients FNS benefit recently cut. Will begin using food pantry she is established with] -- -- [id number] Referral  [Referral placed to [place] [employer], [employer], Solic[employer], and [place] Referral  [[facility] [place] Interventions -- -- -- --  Kandice Robinsons CCM team and PCP team] -- Intervention Not Indicated  Financial Strain Interventions Other (Comment)  [formulary review for preferred insulins] -- --  [Patient assistance for Tyler Aas, CCM Social Worker involved in patient case.] -- -- --      [place]: Food Insecurity Present (09/28/2022)  Housing: Medium Risk (09/28/2022)  Transportation Needs: Unmet Transportation Needs (09/28/2022)   Utilities: At Risk (09/28/2022)  Depression (PHQ2-9): Low Risk  (11/10/2021)  Financial Resource Strain: High Risk (11/10/2022)  Physical Activity: Inactive (07/14/2021)  Stress: No Stress Concern Present (06/03/2020)  Tobacco Use: High Risk (11/28/2022)    CCM Care Plan  Allergies  Allergen Reactions   Ibuprofen Other (See Comments)    Does not take due to hx of renal insufficiency "I have kidney disease"    Lemon Flavor Swelling    Severe Lip Swelling  FRUIT per pt.     Amoxicillin Diarrhea and Other (See Comments)   Tylenol [Acetaminophen] Hives    Cannot take large quantities   Dust Mite Extract Hives    Other reaction(s): Other (See Comments) (as well as paper mites)   Molds & Smuts Hives and Swelling    Other reaction(s): Other (See Comments) Face swelling    Medications Reviewed Today     Reviewed by Lauralyn Primes, RMA (Registered Medical Assistant) on 11/28/22 at 1146  Med List Status: <None>   Medication Order Taking? Sig Documenting Provider Last Dose Status Informant  ACCU-CHEK SMARTVIEW test strip [id number]  USE AS INSTRUCTED TO CHECK SUGARS BEFORE MEALS AND AT BEDTIME [name], Melanie Crazier, MD  Active Self  albuterol (VENTOLIN HFA) 108 (90 Base) MCG/ACT inhaler [id number]  Inhale 2 puffs into the lungs every 6 (six) hours as needed for wheezing or shortness of breath. Michela Pitcher, NP  Active   Ascorbic Acid (VITAMIN C) 1000 MG tablet [id number]  TAKE 1 TABLET BY MOUTH EVERY DAY Minette Brine, FNP  Active Self  baclofen (LIORESAL) 20 MG tablet [id number]  TAKE 1 TABLET BY MOUTH 3  TIMES DAILY Suzzanne Cloud, NP  Active Self  cetirizine (ZYRTEC ALLERGY) 10 MG tablet [id number]  Take 1 tablet (10 mg total) by mouth daily. Minette Brine, FNP  Active Self  Cholecalciferol (VITAMIN  D3) 10 MCG (400 UNIT) tablet [id number]  Take 400 Units by mouth daily. [provider]  Active Self  Cranberry-Vitamin C-Vitamin E 4200-20-3 MG-MG-UNIT CAPS [id number]  Take 1  tablet by mouth daily. [provider]  Active Self  desloratadine (CLARINEX) 5 MG tablet [id number]  TAKE 1 TABLET BY MOUTH DAILY Minette Brine, FNP  Active Self  diazepam (VALIUM) 10 MG tablet [id number]  TAKE 2 TABLETS BY MOUTH IN THE  MORNING, AT NOON, AND AT BEDTIME Penumalli, Vikram R, MD  Active Self  EPINEPHRINE 0.3 mg/0.3 mL IJ SOAJ injection [id number]  INJECT INTRAMUSCULARLY 1 PEN AS  NEEDED FOR ALLERGIC RESPONSE AS  DIRECTED BY MD. Kirke Corin MEDICAL  HELP AFTER USE. Minette Brine, FNP  Active Self  famotidine (PEPCID) 20 MG tablet [id number]  TAKE 1 TABLET BY MOUTH  TWICE DAILY Minette Brine, FNP  Active Self  FLUoxetine (PROZAC) 10 MG capsule [id number]  TAKE 1 CAPSULE BY MOUTH DAILY Suzzanne Cloud, NP  Active Self  fluticasone (FLONASE) 50 MCG/ACT nasal spray [id number]  Place 2 sprays into both nostrils daily. Scot Jun, FNP  Active Self  fluticasone-salmeterol (ADVAIR DISKUS) 250-50 MCG/ACT AEPB [id number]  Inhale 1 puff into the lungs in the morning and at bedtime. Rinse mouth out after each use Michela Pitcher, NP  Active   gabapentin (NEURONTIN) 300 MG capsule [id number]  TAKE 1 CAPSULE BY MOUTH TWICE  DAILY Suzzanne Cloud, NP  Active Self  gabapentin (NEURONTIN) 600 MG tablet [id number]  Take 600 mg by mouth 2 (two) times daily. [provider]  Active Self  hydrOXYzine (VISTARIL) 100 MG capsule [id number]  TAKE 1 CAPSULE BY MOUTH 3  TIMES DAILY AS NEEDED FOR  Gerrit Friends, FNP  Active Self  insulin aspart (NOVOLOG FLEXPEN) 100 UNIT/ML FlexPen [id number]  Max daily 30 units  Patient not taking: Reported on 11/13/2022   [name], Melanie Crazier, MD  Active Self           Med Note Georgiann Cocker, Isac Caddy Nov 20, 2022  2:35 PM) Has not started this yet  insulin isophane & regular human KwikPen (NOVOLIN 70/30 KWIKPEN) (70-30) 100 UNIT/ML KwikPen [id number]  Inject 12 Units into the skin 2 (two) times daily before a meal. Before breakfast and dinner Kc,  Ramesh, MD  Active   Insulin Pen Needle 32G X 4 MM MISC [id number]  1 each by Does not apply route 2 (two) times daily. Dx E11.9 Minette Brine, FNP  Active Self  vitamin E 180 MG (400 UNITS) capsule [id number]  Take 400 Units by mouth daily.  [provider]  Active Self            Patient Active Problem List   Diagnosis Date Noted   Hospital discharge follow-up 11/20/2022   Acute renal failure superimposed on stage 3 chronic kidney disease ([place])    DKA (diabetic ketoacidosis) ([place]) 09/28/2022   Fall 08/17/2022   LOC (loss of consciousness) ([place]) 08/17/2022   Slurred speech 08/17/2022   Type 1 diabetes mellitus with hyperglycemia ([place]) 11/11/2021   Malnutrition of moderate degree 09/09/2021   AMS (altered mental status) 09/09/2021   Acute encephalopathy 09/08/2021   Acute lower UTI 09/08/2021   Elevated LFTs 09/08/2021   Elevated lipase 09/08/2021   Hashimoto's disease 08/09/2021   Hypoglycemia 05/05/2021   AKI (acute kidney injury) ([place]) 05/05/2021   Anxiety    Chronic rhinitis 04/24/2019   Keloid 10/10/2018   Depression 01/02/2017   History  of DVT (deep vein thrombosis) 01/02/2017   Chronic pain syndrome 01/02/2017   Cocaine abuse ([place]) 01/02/2017   Dysphagia    Adjustment insomnia    Distal radius fracture, right 08/16/2016   Postconcussion syndrome 12/30/2014   Tremor 03/11/2014   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   Hiatal hernia 03/21/2013   Anemia, iron deficiency 01/29/2013   Spasm of muscle 09/02/2012   Anemia 08/28/2012   Mild intermittent asthma 08/27/2012   Chronic back pain    Pernicious anemia 10/30/2011   DEGENERATIVE [place] DISEASE 08/18/2010   Asthma with bronchitis 07/28/2010   Recurrent urticaria 07/28/2010    Immunization History  Administered Date(s) Administered   Tdap 04/25/2016, 09/08/2021    Conditions to be addressed/monitored:  Hyperlipidemia, Diabetes, Asthma, Depression, and Anxiety  Care Plan : [place]   Updates made by Charlton Haws, [place] since 12/08/2022 12:00 AM     Problem: Hyperlipidemia, Diabetes, Asthma, Depression, and Anxiety   Priority: High     Long-Range Goal: Disease Management   Start Date: 11/02/2022  Expected End Date: 11/11/2023  This Visit's Progress: On track  Recent Progress: On track  Priority: High  Note:   Current Barriers:  Unable to achieve control of diabetes  Suboptimal therapeutic regimen for diabetes Does not contact provider office for questions/concerns  Pharmacist Clinical Goal(s):  Patient will achieve improvement in DM as evidenced by improved BG adhere to plan to optimize therapeutic regimen for DM as evidenced by report of adherence to recommended medication management changes contact provider office for questions/concerns as evidenced notation of same in electronic health record through collaboration with PharmD and provider.   Interventions: 1:1 collaboration with Michela Pitcher, NP regarding development and update of comprehensive plan of care as evidenced by provider attestation and co-signature Inter-disciplinary care team collaboration (see longitudinal plan of care) Comprehensive medication review performed; medication list updated in electronic medical record   Diabetes - T1 (A1c goal <7%) -Uncontrolled, improving - A1c 8.9% (11/2022) improved from >12%;  -recent admission for DKA 09/28/22 - per discussion with patient , she was out of [place] for a few days prior to admission due to cost issues. She was discharged on Novolin 70/30 mainly for lower cost -DM Dx 2021. Dx changed from T2 to T1 based on elevated islet cell Ab titers. Follows with endocrine (Dr Kelton Pillar) -Completed Freestyle Libre training 11/13/22 -Current home glucose readings: Freestyle Libre w/ reader; pt unable to give average/TIR over the phone; she reports BG is 193 right now; she reports sugar is still "up and down" with low sugars  -Current  medications: Novolin 70/30 12 units AM, 10 units PM - Appropriate, Query Effective Gvoke Hypopen - not picked up [employer] 3 w/ reader - Appropriate, Effective, Safe, Accessible Testing supplies -Medications previously tried: Farxiga, glipizide, metformin, Tresiba -Current meal patterns:  breakfast: egg and cheese biscuit lunch: sandwich, or a hot dog bun with sliced ham and mayonnaise with sugar free yogurt dinner: microwave ready green beans, with red potatoes, and a sandwich snacks: glucerna, cottage cheese drinks: zero gatorade, diet cranberry juice, diet ginger ale; Drinking more water -Current exercise: not currently exercising -Educated on A1c and blood sugar goals; Complications of diabetes including kidney damage, retinal damage, and cardiovascular disease; Prevention and management of hypoglycemic episodes; -Reviewed BCBS FEP formulary: Basaglar, Levemir, Novolog are preferred insulins -Recommend to continue current medication   Asthma / Allergies (Goal: control symptoms and prevent exacerbations) -Controlled -Spirometry 04/2019 - FEV1 99% predicted; FEV1/FVC 0.73 -  Current treatment  Albuterol HFA PRN - Appropriate, Effective, Safe, Accessible Cetirizine 10 mg daily -Appropriate, Effective, Safe, Accessible Desloratadine 5 mg daily - Appropriate, Effective, Safe, Accessible Flonase PRN -Appropriate, Effective, Safe, Accessible Hydroxyzine 100 mg daily PRN itching -Appropriate, Effective, Safe, Accessible Famotidine 20 mg BID Appropriate, Effective, Safe, Accessible Epi-Pen PRN -Appropriate, Effective, Safe, Accessible -Exacerbations requiring treatment in last 6 months: 0 -Patient denies consistent use of maintenance inhaler -Frequency of rescue inhaler use: rare, none in the past four months  -Counseled on Proper inhaler technique; When to use rescue inhaler -Recommended to continue current medication   Hyperlipidemia: (LDL goal < 70) -Uncontrolled - LDL 83  (05/2020), due for repeat lipid panel -10-yr ASCVD risk 7.3% -Current treatment: None -Medications previously tried: none  -Educated on Cholesterol goals; Benefits of statin for ASCVD risk reduction; statin indication with diabetes -Recommend repeat lipid panel; consider statin inititation   Depression/Anxiety (Goal: manage symptoms) -Controlled -PHQ9: 0 (10/2021) -GAD7: n/a -Connected with Neurology for mental health support -Current treatment: Fluoxetine 10 mg daily - Appropriate, Effective, Safe, Accessible Diazepam 10 mg - 2 tab TID -Appropriate, Effective, Safe, Accessible Hydroxyzine 100 mg daily PRN itching -Appropriate, Effective, Safe, Accessible -Medications previously tried/failed: n/a -Educated on Benefits of medication for symptom control -Recommended to continue current medication  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, Dr Leta Baptist -Current treatment  Baclofen 20 mg TID -Appropriate, Effective, Safe, Accessible Gabapentin 300 mg BID -Appropriate, Effective, Safe, Accessible Diazepam 10 mg - 2 tab TID -Appropriate, Effective, Safe, Accessible -Medications previously tried: n/a  -Recommended to continue current medication   GERD (Goal: minimize symptoms of reflux ) -Controlled -Current treatment  Famotidine 20 mg BID -Appropriate, Effective, Safe, Accessible -Medications previously tried: none reported  -Hx of Bleeds/ulcers: No -Recommended to continue current medication  Health Maintenance -Vaccine gaps: Shingrix -Medication management: most meds come through workers comp, but they won't pay for diabetic meds -Decline in GFR to 41 (11/20/22), pt was supposed to return in 1 week for repeat renal panel but did not; scheduled lab appt for 12/21 and coordinating with PCP for lab orders -Current therapy:  Vitamin C 1000 mg Vitamin D 400 IU Vitamin E 400 IU Cranberry - Vitamin C - Vitamin E  Patient Goals/Self-Care Activities Patient will:   - take medications as prescribed as evidenced by patient report and record review focus on medication adherence by routine check glucose 3x daily, document, and provide at future appointments    Medication Assistance: None required.  Patient affirms current coverage meets needs.  Compliance/Adherence/Medication fill history: Care Gaps: Eye exam Foot exam Mammogram AWV  Star-Rating Drugs: None  Medication Access: Within the past 30 days, how often has patient missed a dose of medication? 0 Is a pillbox or other method used to improve adherence? Yes  Factors that may affect medication adherence? financial need Are meds synced by current pharmacy? No  Are meds delivered by current pharmacy? Yes  Does patient experience delays in picking up medications due to transportation concerns? No   [facility] Services Reviewed: Is patient disadvantaged to use [facility]?: Yes  Current Rx insurance plan: BCBS FEP Name and location of Current pharmacy:  [id number] [place] [address]: [phone]: [phone] [place] [address] [address]Phone: [phone]: [phone]  [place] [address] Phone: [phone] Fax: [phone]  [facility] services reviewed with patient today?: No  Patient requests to transfer care to [facility]?: No  Reason patient declined to change pharmacies: Disadvantaged due to insurance/mail order   Care Plan and Follow Up Patient Decision:  Patient agrees to Care Plan and Follow-up.  Plan: Telephone follow up appointment with care management team member scheduled for:  1 month  Charlene Brooke, PharmD, [facility] Clinical Pharmacist [facility] [phone]

## 2022-12-13 ENCOUNTER — Ambulatory Visit: Payer: Medicare Other

## 2022-12-13 NOTE — Progress Notes (Unsigned)
Attempted to call patient 3x, no answer.

## 2022-12-14 ENCOUNTER — Other Ambulatory Visit: Payer: Self-pay | Admitting: Internal Medicine

## 2022-12-14 ENCOUNTER — Other Ambulatory Visit: Payer: Medicare Other

## 2022-12-15 ENCOUNTER — Telehealth: Payer: Self-pay | Admitting: Nurse Practitioner

## 2022-12-15 NOTE — Telephone Encounter (Signed)
Patient states she did not receive a call on 12.20.23 for her wellness visit  Stated she was told she needed to have before the end of the year  Please call patient at [phone] to schedule

## 2022-12-19 ENCOUNTER — Other Ambulatory Visit (INDEPENDENT_AMBULATORY_CARE_PROVIDER_SITE_OTHER): Payer: Medicare Other

## 2022-12-19 DIAGNOSIS — E1065 Type 1 diabetes mellitus with hyperglycemia: Secondary | ICD-10-CM | POA: Diagnosis not present

## 2022-12-19 DIAGNOSIS — N289 Disorder of kidney and ureter, unspecified: Secondary | ICD-10-CM

## 2022-12-19 LAB — BASIC METABOLIC PANEL
BUN: 21 mg/dL (ref 6–23)
CO2: 27 mEq/L (ref 19–32)
Calcium: 10 mg/dL (ref 8.4–10.5)
Chloride: 103 mEq/L (ref 96–112)
Creatinine, Ser: 1.27 mg/dL — ABNORMAL HIGH (ref 0.40–1.20)
GFR: 46.69 mL/min — ABNORMAL LOW (ref >60.00)
Glucose, Bld: 66 mg/dL — ABNORMAL LOW (ref 70–99)
Potassium: 4.3 mEq/L (ref 3.5–5.1)
Sodium: 139 mEq/L (ref 135–145)

## 2022-12-19 LAB — LIPID PANEL
Cholesterol: 226 mg/dL — ABNORMAL HIGH (ref 0–200)
HDL: 92 mg/dL (ref >39.00)
LDL Cholesterol: 111 mg/dL — ABNORMAL HIGH (ref 0–99)
NonHDL: 133.89
Total CHOL/HDL Ratio: 2
Triglycerides: 116 mg/dL (ref 0.0–149.0)
VLDL: 23.2 mg/dL (ref 0.0–40.0)

## 2022-12-21 ENCOUNTER — Telehealth: Payer: Self-pay | Admitting: Nurse Practitioner

## 2022-12-21 NOTE — Telephone Encounter (Signed)
LVM for pt to rtn my call to schedule AWV with [facility] call back # [phone] 

## 2022-12-21 NOTE — Telephone Encounter (Signed)
Pt notified about lab results  

## 2022-12-21 NOTE — Telephone Encounter (Signed)
Pt called in would like a call back to review lab results . Stated can not get on her Mychart . Please advise [phone]

## 2022-12-30 ENCOUNTER — Other Ambulatory Visit (HOSPITAL_COMMUNITY): Payer: Self-pay

## 2023-01-08 ENCOUNTER — Telehealth: Payer: Self-pay

## 2023-01-08 ENCOUNTER — Ambulatory Visit: Payer: Medicare Other | Admitting: Internal Medicine

## 2023-01-08 NOTE — Progress Notes (Signed)
   Care Guide Note  01/08/2023 Name: [name] MRN: [id number] DOB: [birth year]  Referred by: Michela Pitcher, NP Reason for referral : Care Coordination (Outreach to Transition to [facility] Pharmacist )   Sherri [name] is a 57 y.o. year old female who is a primary care patient of Michela Pitcher, NP. Sherri [name] was referred to the pharmacist for assistance related to DM.    An unsuccessful telephone outreach was attempted today to contact the patient who was referred to the pharmacy team for assistance with medication management. Additional attempts will be made to contact the patient.   Sherri [name], [place], [place] [address] Direct Dial: [phone] [name]

## 2023-01-08 NOTE — Progress Notes (Signed)
   Care Guide Note  01/08/2023 Name: Kinsly Hild MRN: [id number] DOB: 09-29-1966  Referred by: Michela Pitcher, NP Reason for referral : Care Coordination (Outreach to Transition to [facility] Pharmacist )   Aibhlinn Kalmar is a 57 y.o. year old female who is a primary care patient of Michela Pitcher, NP. Sinthia Karabin was referred to the pharmacist for assistance related to DM.    Successful contact was made with the patient to discuss pharmacy services including being ready for the pharmacist to call at least 5 minutes before the scheduled appointment time, to have medication bottles and any blood sugar or blood pressure readings ready for review. The patient agreed to meet with the pharmacist via with the pharmacist via telephone visit on (date/time).  01/30/2023  Noreene Larsson, [place], [place] [address] Direct Dial: [phone] [email or web address]

## 2023-01-11 ENCOUNTER — Encounter: Payer: Medicare Other | Admitting: Pharmacist

## 2023-01-23 ENCOUNTER — Other Ambulatory Visit: Payer: Self-pay

## 2023-01-23 ENCOUNTER — Telehealth: Payer: Self-pay | Admitting: Diagnostic Neuroimaging

## 2023-01-23 MED ORDER — GABAPENTIN 300 MG PO CAPS: 300.0000 mg | ORAL_CAPSULE | Freq: Two times a day (BID) | ORAL | 1 refills | Status: DC

## 2023-01-23 MED ORDER — FLUOXETINE HCL 10 MG PO CAPS: 10.0000 mg | ORAL_CAPSULE | Freq: Every day | ORAL | 1 refills | Status: DC

## 2023-01-23 NOTE — Telephone Encounter (Signed)
Pt request refills for  gabapentin (NEURONTIN) 300 MG capsule and FLUoxetine (PROZAC) 10 MG capsule at [facility]

## 2023-01-23 NOTE — Telephone Encounter (Signed)
Refills have been sent for the pt to the pharmacy requested

## 2023-01-23 NOTE — Telephone Encounter (Signed)
Last visit 11/20/2022 Next appointment per provider 05/21/2023

## 2023-01-24 MED ORDER — FAMOTIDINE 20 MG PO TABS: 20.0000 mg | ORAL_TABLET | Freq: Two times a day (BID) | ORAL | 1 refills | Status: DC

## 2023-01-24 MED ORDER — DESLORATADINE 5 MG PO TABS: 5.0000 mg | ORAL_TABLET | Freq: Every day | ORAL | 1 refills | Status: DC

## 2023-01-27 ENCOUNTER — Emergency Department (HOSPITAL_COMMUNITY)
Admission: EM | Admit: 2023-01-27 | Discharge: 2023-01-27 | Disposition: A | Discharge status: Home / Self Care | Payer: Medicare Other | Attending: Emergency Medicine | Admitting: Emergency Medicine

## 2023-01-27 ENCOUNTER — Emergency Department (HOSPITAL_COMMUNITY): Payer: Medicare Other

## 2023-01-27 ENCOUNTER — Encounter (HOSPITAL_COMMUNITY): Payer: Self-pay

## 2023-01-27 ENCOUNTER — Other Ambulatory Visit: Payer: Self-pay

## 2023-01-27 DIAGNOSIS — M25511 Pain in right shoulder: Secondary | ICD-10-CM | POA: Insufficient documentation

## 2023-01-27 DIAGNOSIS — J45909 Unspecified asthma, uncomplicated: Secondary | ICD-10-CM | POA: Insufficient documentation

## 2023-01-27 DIAGNOSIS — N189 Chronic kidney disease, unspecified: Secondary | ICD-10-CM | POA: Insufficient documentation

## 2023-01-27 DIAGNOSIS — R079 Chest pain, unspecified: Secondary | ICD-10-CM | POA: Diagnosis not present

## 2023-01-27 DIAGNOSIS — K449 Diaphragmatic hernia without obstruction or gangrene: Secondary | ICD-10-CM | POA: Diagnosis not present

## 2023-01-27 DIAGNOSIS — J449 Chronic obstructive pulmonary disease, unspecified: Secondary | ICD-10-CM | POA: Diagnosis not present

## 2023-01-27 DIAGNOSIS — R0789 Other chest pain: Secondary | ICD-10-CM | POA: Diagnosis not present

## 2023-01-27 DIAGNOSIS — W19XXXA Unspecified fall, initial encounter: Secondary | ICD-10-CM

## 2023-01-27 MED ORDER — CYCLOBENZAPRINE HCL 10 MG PO TABS: 10.0000 mg | ORAL_TABLET | Freq: Two times a day (BID) | ORAL | 0 refills | Status: DC | PRN

## 2023-01-27 MED ORDER — OXYCODONE HCL 5 MG PO TABS: 5.0000 mg | ORAL_TABLET | ORAL | 0 refills | Status: DC | PRN

## 2023-01-27 NOTE — ED Provider Notes (Signed)
[facility] Provider Note   CSN: [id number] Arrival date & time: 01/27/23  4818     History  Chief Complaint  Patient presents with   Lytle Michaels    Sherri [name] is a 57 y.o. female with a past medical history of asthma, COPD, CKD, depression, DVT, GERD, stiff person syndrome presenting today for evaluation after fall.  Patient reports she had a fall on Monday this week.  She was stepping up on a curb when her left leg got stiff, she stepped back and fell on her right side.  She denies hitting her head or LOC. She was able to get up and ambulate after the fall. Patient reports her right-sided chest hurt when she takes a deep breath.  No nausea or vomiting or vision changes. She denies headache, dizziness. She denies vision changes, dizziness before the fall. She complains of right shoulder pain and right-sided chest pain since then.  She is not on any blood thinners. She has tried muscle relaxant with minimal improvement.    Fall    Past Medical History:  Diagnosis Date   Angioedema    Anxiety    Arthritis    Asthma    Benzodiazepine withdrawal ([place]) 08/30/2012   CAP (community acquired pneumonia) 08/29/2012   Chronic back pain    Chronic kidney disease    Closed nondisplaced fracture of proximal phalanx of right little finger 10/18/2018   Cocaine abuse ([place])    Depression    Diarrhea    DVT (deep venous thrombosis) (HCC)    Environmental allergies    Fall    GERD (gastroesophageal reflux disease)    Heart murmur    has been told once that she has a heart murmur, but has never had any problems   Hiatal hernia 03/21/2013   Lumbar herniated disc    Peripheral vascular disease (HCC)    Pernicious anemia 10/30/2011   Postconcussion syndrome 12/30/2014   Rhabdomyolysis 08/27/2012   Seasonal allergies    Stiff person syndrome    Urticaria    Past Surgical History:  Procedure Laterality Date   BUNIONECTOMY Left    colonoscopy  07/09/15    inguinal hernia 1983  1983   OPEN REDUCTION INTERNAL FIXATION (ORIF) DISTAL RADIAL FRACTURE Right 08/16/2016   Procedure: OPEN REDUCTION INTERNAL FIXATION (ORIF) DISTAL RADIAL FRACTURE;  Surgeon: Marybelle Killings, MD;  Location: [facility];  Service: Orthopedics;  Laterality: Right;     Home Medications Prior to Admission medications   Medication Sig Start Date End Date Taking? Authorizing Provider  cyclobenzaprine (FLEXERIL) 10 MG tablet Take 1 tablet (10 mg total) by mouth 2 (two) times daily as needed for muscle spasms. 01/27/23  Yes Rex Kras, PA  oxyCODONE (ROXICODONE) 5 MG immediate release tablet Take 1 tablet (5 mg total) by mouth every 4 (four) hours as needed for up to 8 doses for severe pain. 01/27/23  Yes Rex Kras, PA  ACCU-CHEK SMARTVIEW test strip USE AS INSTRUCTED TO CHECK SUGARS BEFORE MEALS AND AT BEDTIME 07/18/22   [name], Melanie Crazier, MD  albuterol (VENTOLIN HFA) 108 (90 Base) MCG/ACT inhaler Inhale 2 puffs into the lungs every 6 (six) hours as needed for wheezing or shortness of breath. 10/30/22   Michela Pitcher, NP  Ascorbic Acid (VITAMIN C) 1000 MG tablet TAKE 1 TABLET BY MOUTH EVERY DAY 09/28/21   Minette Brine, FNP  baclofen (LIORESAL) 20 MG tablet TAKE 1 TABLET BY MOUTH 3  TIMES DAILY  05/17/22   Glean Salvo, NP  cetirizine (ZYRTEC ALLERGY) 10 MG tablet Take 1 tablet (10 mg total) by mouth daily. 04/21/22 04/21/23  Arnette Felts, FNP  Cholecalciferol (VITAMIN D3) 10 MCG (400 UNIT) tablet Take 400 Units by mouth daily.    [provider]  Cranberry-Vitamin C-Vitamin E 4200-20-3 MG-MG-UNIT CAPS Take 1 tablet by mouth daily.    [provider]  desloratadine (CLARINEX) 5 MG tablet Take 1 tablet (5 mg total) by mouth daily. 01/24/23   Eden Emms, NP  diazepam (VALIUM) 10 MG tablet TAKE 2 TABLETS BY MOUTH IN THE  MORNING, AT NOON, AND AT BEDTIME 08/07/22   Penumalli, Vikram R, MD  EPINEPHRINE 0.3 mg/0.3 mL IJ SOAJ injection INJECT INTRAMUSCULARLY 1 PEN AS  NEEDED FOR  ALLERGIC RESPONSE AS  DIRECTED BY MD. Assunta Found MEDICAL  HELP AFTER USE. 03/30/22   Arnette Felts, FNP  famotidine (PEPCID) 20 MG tablet Take 1 tablet (20 mg total) by mouth 2 (two) times daily. 01/24/23   Eden Emms, NP  FLUoxetine (PROZAC) 10 MG capsule Take 1 capsule (10 mg total) by mouth daily. 01/23/23   Penumalli, Glenford Bayley, MD  fluticasone (FLONASE) 50 MCG/ACT nasal spray Place 2 sprays into both nostrils daily. 11/29/21   Bing Neighbors, NP  fluticasone-salmeterol (ADVAIR DISKUS) 250-50 MCG/ACT AEPB Inhale 1 puff into the lungs in the morning and at bedtime. Rinse mouth out after each use 11/20/22   Eden Emms, NP  gabapentin (NEURONTIN) 300 MG capsule Take 1 capsule (300 mg total) by mouth 2 (two) times daily. 01/23/23   Penumalli, Glenford Bayley, MD  gabapentin (NEURONTIN) 600 MG tablet Take 600 mg by mouth 2 (two) times daily.    [provider]  hydrOXYzine (VISTARIL) 100 MG capsule TAKE 1 CAPSULE BY MOUTH 3  TIMES DAILY AS NEEDED FOR  ITCHING 03/01/21   Arnette Felts, FNP  insulin isophane & regular human KwikPen (NOVOLIN 70/30 KWIKPEN) (70-30) 100 UNIT/ML KwikPen Inject 12 Units into the skin daily before breakfast AND 10 Units daily before supper. Before breakfast and dinner. 11/28/22   [name], [name], MD  Insulin Pen Needle 32G X 4 MM MISC 1 each by Does not apply route 2 (two) times daily. Dx E11.9 11/28/22   [name], [name], MD  vitamin E 180 MG (400 UNITS) capsule Take 400 Units by mouth daily.     [provider]      Allergies    Ibuprofen, Lemon flavor, Amoxicillin, Tylenol [acetaminophen], Dust mite extract, and Molds & smuts    Review of Systems   Review of Systems Negative except as per HPI.  Physical Exam Updated Vital Signs BP 124/83 (BP Location: Right Arm)   Pulse 68   Temp 98.1 F (36.7 C) (Oral)   Resp 19   Ht 5\' 3"  (1.6 m)   Wt 56.7 kg   LMP 07/29/2012 (Approximate)   SpO2 100%   BMI 22.14 kg/m  Physical Exam Vitals  and nursing note reviewed.  Constitutional:      Appearance: Normal appearance.  HENT:     Head: Normocephalic and atraumatic.     Mouth/Throat:     Mouth: Mucous membranes are moist.  Eyes:     General: No scleral icterus. Cardiovascular:     Rate and Rhythm: Normal rate and regular rhythm.     Pulses: Normal pulses.     Heart sounds: Normal heart sounds.  Pulmonary:     Effort: Pulmonary effort is  normal.     Breath sounds: Normal breath sounds.  Abdominal:     General: Abdomen is flat.     Palpations: Abdomen is soft.     Tenderness: There is no abdominal tenderness.  Musculoskeletal:        General: No deformity.     Comments: Tenderness to palpation to right shoulder and right-sided chest wall.  Right shoulder with limited passive range of motion.  No bruises noted on skin.  Skin:    General: Skin is warm.     Findings: No rash.  Neurological:     General: No focal deficit present.     Mental Status: She is alert.  Psychiatric:        Mood and Affect: Mood normal.     ED Results / Procedures / Treatments   Labs (all labs ordered are listed, but only abnormal results are displayed) Labs Reviewed - No data to display  EKG EKG Interpretation  Date/Time:  Saturday January 27 2023 10:44:03 EST Ventricular Rate:  59 PR Interval:  148 QRS Duration: 70 QT Interval:  433 QTC Calculation: 429 R Axis:   57 Text Interpretation: Sinus rhythm Probable left atrial enlargement RSR' in V1 or V2, probably normal variant Minimal ST elevation, inferior leads Confirmed by Godfrey Pick [phone]) on 01/27/2023 11:17:11 AM  Radiology DG Chest 2 View  Result Date: 01/27/2023 CLINICAL DATA:  Fall, right shoulder pain EXAM: CHEST - 2 VIEW COMPARISON:  09/28/2022 FINDINGS: The heart size and mediastinal contours are within normal limits. Hiatal hernia. Both lungs are clear. The visualized skeletal structures are unremarkable. IMPRESSION: 1. No acute abnormality of the lungs. 2. Hiatal hernia.  Electronically Signed   By: Delanna Ahmadi M.D.   On: 01/27/2023 10:42   DG Shoulder Right  Result Date: 01/27/2023 CLINICAL DATA:  Fall with shoulder pain EXAM: RIGHT SHOULDER - 2+ VIEW COMPARISON:  None Available. FINDINGS: No acute fracture or dislocation. Visualized portion of the right hemithorax is normal. IMPRESSION: No acute osseous abnormality. Electronically Signed   By: Abigail Miyamoto M.D.   On: 01/27/2023 10:41    Procedures Procedures    Medications Ordered in ED Medications - No data to display  ED Course/ Medical Decision Making/ A&P                             Medical Decision Making Amount and/or Complexity of Data Reviewed Radiology: ordered.  Risk Prescription drug management.   This patient presents to the ED for right shoulder pain, right-sided chest pain, this involves an extensive number of treatment options, and is a complaint that carries with a high risk of complications and morbidity.  The differential diagnosis includes fracture, dislocation, arthritis, ACS/MI, pneumothorax, PE.  This is not an exhaustive list.  Imaging studies: I ordered imaging studies. I personally reviewed, interpreted imaging and agree with the radiologist's interpretations. The results include: Right shoulder x-ray and chest x-ray show no acute abnormalities.  Problem list/ ED course/ Critical interventions/ Medical management: HPI: See above Vital signs within normal range and stable throughout visit. Laboratory/imaging studies significant for: See above. On physical examination, patient is afebrile and appears in no acute distress. Exam without evidence of volume overload so doubt heart failure. EKG without signs of active ischemia. Presentation not consistent with acute PE ([facility]s low risk), pneumothorax (not visualized on chest xr), thoracic aortic dissection, pericarditis, tamponade, pneumonia (no infectious symptoms, clear chest xr), myocarditis (no recent  illness, neg trop). HEART  score:2 so plan to discharge patient home with PMD follow up. Based on patient's clinical presentations and laboratory/imaging studies I suspect musculoskeletal pain.  I sent an Rx of Flexeril and a short course of Percocet for pain control.  Advised patient to follow-up with PCP, return to the ER if new or worsening symptoms. I have reviewed the patient home medicines and have made adjustments as needed.  Cardiac monitoring/EKG: The patient was maintained on a cardiac monitor.  I personally reviewed and interpreted the cardiac monitor which showed an underlying rhythm of: sinus rhythm.  Additional history obtained: External records from outside source obtained and reviewed including: Chart review including previous notes, labs, imaging.  Disposition Continued outpatient therapy. Follow-up with PCP recommended for reevaluation of symptoms. Treatment plan discussed with patient.  Pt acknowledged understanding was agreeable to the plan. Worrisome signs and symptoms were discussed with patient, and patient acknowledged understanding to return to the ED if they noticed these signs and symptoms. Patient was stable upon discharge.   This chart was dictated using voice recognition software.  Despite best efforts to proofread,  errors can occur which can change the documentation meaning.          Final Clinical Impression(s) / ED Diagnoses Final diagnoses:  Fall, initial encounter  Acute pain of right shoulder  Right-sided chest wall pain    Rx / DC Orders ED Discharge Orders          Ordered    cyclobenzaprine (FLEXERIL) 10 MG tablet  2 times daily PRN        01/27/23 1141    oxyCODONE (ROXICODONE) 5 MG immediate release tablet  Every 4 hours PRN        01/27/23 1141              Rex Kras, [place] 01/27/23 2021    Godfrey Pick, MD 01/29/23 0830

## 2023-01-27 NOTE — ED Triage Notes (Addendum)
Patient said she fell after losing her balance, slipping off the curb. No LOC. Complaining of right shoulder pain and right chest pain. Chest hurts every time she takes a deep breath. No blood thinners.

## 2023-01-27 NOTE — Discharge Instructions (Addendum)
Please take your medications as prescribed. Please take flexeril or Percocet as needed for pain. I recommend close follow-up with PCP for reevaluation.  Please do not hesitate to return to emergency department if worrisome signs symptoms we discussed become apparent.

## 2023-01-29 ENCOUNTER — Ambulatory Visit: Payer: Medicare Other | Admitting: Nurse Practitioner

## 2023-01-29 ENCOUNTER — Telehealth: Payer: Self-pay

## 2023-01-29 NOTE — Telephone Encounter (Signed)
[facility] Night - Client Nonclinical Telephone Record  [facility] Client [facility] Night - Client Client Site Grandfather Provider Romilda Garret- NP Contact Type Call Who Is Calling Patient / Member / Family / Caregiver Caller Name Jefferson Phone Number [phone] Patient Name Sherri [name] Patient DOB [birth year] Call Type Message Only Information Provided Reason for Call Request to Reschedule Office Appointment Initial Comment Caller states she needs to cancel her appointment at 1030. Needs it rescheduled. Patient request to speak to RN No Disp. Time Disposition Final User 01/29/2023 6:50:43 AM General Information Provided Yes Gae Gallop Call Closed By: Gae Gallop Transaction Date/Time: 01/29/2023 6:47:46 AM (ET

## 2023-01-30 ENCOUNTER — Other Ambulatory Visit: Payer: Medicare Other | Admitting: Pharmacist

## 2023-01-31 ENCOUNTER — Ambulatory Visit: Payer: Medicare Other | Admitting: Nurse Practitioner

## 2023-02-02 ENCOUNTER — Ambulatory Visit (INDEPENDENT_AMBULATORY_CARE_PROVIDER_SITE_OTHER): Payer: Federal, State, Local not specified - PPO | Admitting: Nurse Practitioner

## 2023-02-02 ENCOUNTER — Encounter: Payer: Self-pay | Admitting: Nurse Practitioner

## 2023-02-02 VITALS — BP 102/78 | HR 91 | Temp 98.0°F | Resp 16 | Ht 63.0 in | Wt 115.5 lb

## 2023-02-02 DIAGNOSIS — Z09 Encounter for follow-up examination after completed treatment for conditions other than malignant neoplasm: Secondary | ICD-10-CM | POA: Diagnosis not present

## 2023-02-02 DIAGNOSIS — W19XXXD Unspecified fall, subsequent encounter: Secondary | ICD-10-CM | POA: Diagnosis not present

## 2023-02-02 NOTE — Progress Notes (Signed)
Established Patient Office Visit  Subjective   Patient ID: Randalyn Ellers, female    DOB: 12-May-1966  Age: 57 y.o. MRN: [id number]  Chief Complaint  Patient presents with   Hospitalization Follow-up    HPI  Hospital follow up: Patient was seen in the emergency department on 01/27/2023 had a fall on Monday the same week states she was stepping up on a curb and her left leg got stiff she stepped back and fell on her right side.  She did not hit her head or have LOC per ED report she was able to get up and ambulate after the fall per ED report during ER she was experiencing right-sided chest discomfort when she took a deep breath.  Not on any blood thinners but did try a muscle relaxant prior to going to the emergency department patient was prescribed cyclobenzaprine 10 mg twice daily as needed along with oxycodone 5 mg every 4 hours as needed. Patient did undergo a shoulder x-ray along with chest x-ray.  Both negative for acute abnormalities sans hiatal hernia noticed on chest x-ray.  Patient is here for hospital follow-up  States that she has a shoulder sling. State atht she was unable to get it back. Has used some of the muscle rlaxnat and pain edicatoin. Has used the mucsle relaxer and the pain medication. She is still having some tnederness to the touch on the anterior shoulder    Review of Systems  Constitutional:  Negative for chills and fever.  Respiratory:  Negative for shortness of breath.   Cardiovascular:  Negative for chest pain.  Musculoskeletal:  Positive for joint pain.  Neurological:  Negative for tingling, weakness and headaches.      Objective:     BP 102/78   Pulse 91   Temp 98 F (36.7 C)   Resp 16   Ht 5' 3"$  (1.6 m)   Wt 115 lb 8 oz (52.4 kg)   LMP 07/29/2012 (Approximate)   SpO2 99%   BMI 20.46 kg/m    Physical Exam Vitals and nursing note reviewed.  Constitutional:      Appearance: Normal appearance.  Cardiovascular:     Rate and Rhythm: Normal  rate and regular rhythm.     Heart sounds: Normal heart sounds.  Pulmonary:     Effort: Pulmonary effort is normal.     Breath sounds: Normal breath sounds.  Musculoskeletal:       Arms:     Comments: Decent range of motion.  Patient has some discomfort at over 90 degrees of anterior extension and lateral extension.  Negative empty can test.  Negative Michel Bickers test.  2+ bilateral radial pulses 2+ bicep tendon reflexes.  Neurological:     Mental Status: She is alert.      No results found for any visits on 02/02/23.    The 10-year ASCVD risk score (Arnett DK, et al., 2019) is: 7.6%    Assessment & Plan:   Problem List Items Addressed This Visit       Other   Falls, subsequent encounter - Primary    Patient was evaluated emergency department for a fall.  Mechanical in nature.  She does admit her x-rays that showed no acute abnormality.  She was written muscle relaxant on top of opioid pain medication.  She is been taking this sparingly.  She also has a shoulder sling that she will use intermittently.  She is improving overall.      Hospital discharge follow-up  Did review imaging and ED note.       Return in about 3 months (around 05/03/2023) for CPE and Labs.    Romilda Garret, NP

## 2023-02-02 NOTE — Assessment & Plan Note (Signed)
Patient was evaluated emergency department for a fall.  Mechanical in nature.  She does admit her x-rays that showed no acute abnormality.  She was written muscle relaxant on top of opioid pain medication.  She is been taking this sparingly.  She also has a shoulder sling that she will use intermittently.  She is improving overall.

## 2023-02-02 NOTE — Assessment & Plan Note (Signed)
Did review imaging and ED note.

## 2023-02-02 NOTE — Patient Instructions (Signed)
Nice to see you today I want to see you in May this year for your physical, sooner if you need me

## 2023-02-13 ENCOUNTER — Other Ambulatory Visit: Payer: Self-pay

## 2023-02-13 MED ORDER — FAMOTIDINE 20 MG PO TABS: 20.0000 mg | ORAL_TABLET | Freq: Two times a day (BID) | ORAL | 1 refills | Status: DC

## 2023-02-13 MED ORDER — DESLORATADINE 5 MG PO TABS: 5.0000 mg | ORAL_TABLET | Freq: Every day | ORAL | 1 refills | Status: DC

## 2023-02-13 NOTE — Telephone Encounter (Signed)
[facility] to [facility] need to go to [facility] Last visit  02/092024 Next visit 05/092024

## 2023-02-17 DIAGNOSIS — R55 Syncope and collapse: Secondary | ICD-10-CM | POA: Diagnosis not present

## 2023-02-18 ENCOUNTER — Other Ambulatory Visit: Payer: Self-pay | Admitting: Nurse Practitioner

## 2023-02-23 ENCOUNTER — Telehealth: Payer: Self-pay | Admitting: Nurse Practitioner

## 2023-02-23 DIAGNOSIS — 419620001 Death: Secondary | SNOMED CT | POA: Diagnosis not present

## 2023-02-23 NOTE — Telephone Encounter (Signed)
Quick from [facility] called in about getting a death certificate sign

## 2023-02-23 NOTE — Telephone Encounter (Signed)
Continuation of Sherri [name]'s message :  Quick from [facility] called in about getting a death certificate signed for pt from [place]. Call back # [phone]

## 2023-02-23 DEATH — deceased

## 2023-02-26 NOTE — Telephone Encounter (Signed)
Called and spoke to Mr Mikael Spray. She passed at home on March 19, 2023 Time announced 245 pm. No autopsy was done. No suspicious circumstances.   Called and spoke to her sisters. Her sister, Leda Gauze, found her lying on floor on her back. Said she would have issues with her blood sugar getting too low at times. 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 Injured her right shoulder a few weeks before and was in a sling. She was not sick or complained of anything not feeling right prior to her passing.  Leda Gauze stated Catalina [place]ig[place] can call her if needed for more information. She will do her best to help.

## 2023-02-26 NOTE — Telephone Encounter (Signed)
Can we call the funeral home and get some more information. I do not see where she was in the hospital setting when she pasted. Was she found dead at home. Is this going to be an ME case? Any suspicious circumstances

## 2023-02-27 NOTE — Telephone Encounter (Signed)
I was able to get into [facility] [place]. There was no certificate for me to sign. They may have sent it to the [facility] account. It needs to go to the [facility] one

## 2023-02-27 NOTE — Telephone Encounter (Signed)
I still do not have a death certificate to sign

## 2023-02-27 NOTE — Telephone Encounter (Signed)
Spoke to Mr. Quick at the funeral home and asked him to resend the death certificate  Mr. Quick called back and stated he re-sent to [facility]

## 2023-02-28 ENCOUNTER — Telehealth: Payer: Self-pay | Admitting: Nurse Practitioner

## 2023-02-28 NOTE — Telephone Encounter (Signed)
Called [facility] and spoke to [place]. Let her know that the death certificate has been completed

## 2023-03-09 ENCOUNTER — Ambulatory Visit: Payer: Medicare Other | Admitting: Family

## 2023-03-09 ENCOUNTER — Ambulatory Visit: Payer: Medicare Other

## 2023-03-09 ENCOUNTER — Other Ambulatory Visit: Payer: Medicare Other

## 2023-03-15 ENCOUNTER — Other Ambulatory Visit: Payer: Medicare Other | Admitting: Pharmacist

## 2023-04-11 ENCOUNTER — Ambulatory Visit: Payer: Medicare Other | Admitting: Internal Medicine

## 2023-05-03 ENCOUNTER — Encounter: Payer: Medicare Other | Admitting: Nurse Practitioner

## 2023-05-14 ENCOUNTER — Other Ambulatory Visit: Payer: Self-pay | Admitting: Family Medicine

## 2023-07-12 ENCOUNTER — Ambulatory Visit: Payer: Medicare Other | Admitting: Neurology
